# Patient Record
Sex: Female | Born: 1935 | Race: White | Hispanic: No | Smoking: Former smoker
Health system: Southern US, Community
[De-identification: ages and names within clinical notes are randomized; demographics above are authoritative.]

## PROBLEM LIST (undated history)

## (undated) DIAGNOSIS — Z87891 Personal history of nicotine dependence: Secondary | ICD-10-CM

## (undated) DIAGNOSIS — D649 Anemia, unspecified: Secondary | ICD-10-CM

## (undated) DIAGNOSIS — Z860100 Personal history of colon polyps, unspecified: Secondary | ICD-10-CM

## (undated) DIAGNOSIS — J449 Chronic obstructive pulmonary disease, unspecified: Secondary | ICD-10-CM

## (undated) DIAGNOSIS — R918 Other nonspecific abnormal finding of lung field: Secondary | ICD-10-CM

## (undated) DIAGNOSIS — J439 Emphysema, unspecified: Secondary | ICD-10-CM

## (undated) DIAGNOSIS — Z1501 Genetic susceptibility to malignant neoplasm of breast: Secondary | ICD-10-CM

## (undated) DIAGNOSIS — I10 Essential (primary) hypertension: Secondary | ICD-10-CM

## (undated) DIAGNOSIS — K219 Gastro-esophageal reflux disease without esophagitis: Secondary | ICD-10-CM

## (undated) DIAGNOSIS — I2510 Atherosclerotic heart disease of native coronary artery without angina pectoris: Secondary | ICD-10-CM

## (undated) DIAGNOSIS — D45 Polycythemia vera: Secondary | ICD-10-CM

## (undated) DIAGNOSIS — E7800 Pure hypercholesterolemia, unspecified: Secondary | ICD-10-CM

## (undated) DIAGNOSIS — R739 Hyperglycemia, unspecified: Secondary | ICD-10-CM

## (undated) DIAGNOSIS — Z1509 Genetic susceptibility to other malignant neoplasm: Secondary | ICD-10-CM

## (undated) DIAGNOSIS — N281 Cyst of kidney, acquired: Secondary | ICD-10-CM

## (undated) DIAGNOSIS — C4490 Unspecified malignant neoplasm of skin, unspecified: Secondary | ICD-10-CM

## (undated) DIAGNOSIS — I509 Heart failure, unspecified: Secondary | ICD-10-CM

## (undated) DIAGNOSIS — M810 Age-related osteoporosis without current pathological fracture: Secondary | ICD-10-CM

## (undated) DIAGNOSIS — C679 Malignant neoplasm of bladder, unspecified: Secondary | ICD-10-CM

## (undated) DIAGNOSIS — J45909 Unspecified asthma, uncomplicated: Secondary | ICD-10-CM

## (undated) HISTORY — DX: Emphysema, unspecified: J43.9

## (undated) HISTORY — PX: SALPINGOOPHORECTOMY: SHX82

## (undated) HISTORY — DX: Unspecified asthma, uncomplicated: J45.909

## (undated) HISTORY — PX: ANGIOPLASTY: SHX39

## (undated) HISTORY — DX: Heart failure, unspecified: I50.9

## (undated) HISTORY — DX: Cyst of kidney, acquired: N28.1

## (undated) HISTORY — DX: Chronic obstructive pulmonary disease, unspecified: J44.9

## (undated) HISTORY — DX: Malignant neoplasm of bladder, unspecified: C67.9

## (undated) HISTORY — PX: TUBAL LIGATION: SHX77

## (undated) HISTORY — PX: CORONARY ANGIOPLASTY: SHX604

## (undated) HISTORY — DX: Personal history of colonic polyps: Z86.010

## (undated) HISTORY — DX: Genetic susceptibility to malignant neoplasm of breast: Z15.09

## (undated) HISTORY — DX: Atherosclerotic heart disease of native coronary artery without angina pectoris: I25.10

## (undated) HISTORY — DX: Essential (primary) hypertension: I10

## (undated) HISTORY — DX: Other nonspecific abnormal finding of lung field: R91.8

## (undated) HISTORY — DX: Personal history of nicotine dependence: Z87.891

## (undated) HISTORY — DX: Age-related osteoporosis without current pathological fracture: M81.0

## (undated) HISTORY — DX: Genetic susceptibility to malignant neoplasm of breast: Z15.01

## (undated) HISTORY — PX: TONSILECTOMY/ADENOIDECTOMY WITH MYRINGOTOMY: SHX6125

## (undated) HISTORY — DX: Polycythemia vera: D45

## (undated) HISTORY — DX: Personal history of colon polyps, unspecified: Z86.0100

## (undated) HISTORY — DX: Pure hypercholesterolemia, unspecified: E78.00

## (undated) HISTORY — DX: Hyperglycemia, unspecified: R73.9

## (undated) HISTORY — PX: TONSILLECTOMY: SUR1361

---

## 2004-06-08 ENCOUNTER — Ambulatory Visit: Payer: Self-pay | Admitting: Otolaryngology

## 2004-06-21 ENCOUNTER — Ambulatory Visit: Payer: Self-pay | Admitting: Internal Medicine

## 2004-07-14 ENCOUNTER — Ambulatory Visit: Payer: Self-pay | Admitting: Otolaryngology

## 2005-01-24 ENCOUNTER — Ambulatory Visit: Payer: Self-pay | Admitting: Ophthalmology

## 2005-01-30 ENCOUNTER — Ambulatory Visit: Payer: Self-pay | Admitting: Ophthalmology

## 2005-03-06 ENCOUNTER — Ambulatory Visit: Payer: Self-pay | Admitting: Ophthalmology

## 2005-03-13 ENCOUNTER — Ambulatory Visit: Payer: Self-pay | Admitting: Ophthalmology

## 2005-06-26 ENCOUNTER — Ambulatory Visit: Payer: Self-pay | Admitting: Internal Medicine

## 2005-10-19 ENCOUNTER — Emergency Department: Payer: Self-pay | Admitting: Emergency Medicine

## 2006-01-29 ENCOUNTER — Ambulatory Visit: Payer: Self-pay | Admitting: Specialist

## 2006-01-30 ENCOUNTER — Ambulatory Visit: Payer: Self-pay | Admitting: Gastroenterology

## 2006-06-27 ENCOUNTER — Ambulatory Visit: Payer: Self-pay | Admitting: Internal Medicine

## 2007-04-09 ENCOUNTER — Ambulatory Visit: Payer: Self-pay | Admitting: Gastroenterology

## 2007-07-02 ENCOUNTER — Ambulatory Visit: Payer: Self-pay | Admitting: Internal Medicine

## 2008-08-05 ENCOUNTER — Ambulatory Visit: Payer: Self-pay | Admitting: Internal Medicine

## 2008-09-15 ENCOUNTER — Ambulatory Visit: Payer: Self-pay | Admitting: Gastroenterology

## 2008-10-08 ENCOUNTER — Ambulatory Visit: Payer: Self-pay | Admitting: Gastroenterology

## 2009-01-14 ENCOUNTER — Ambulatory Visit: Payer: Self-pay | Admitting: Unknown Physician Specialty

## 2009-08-09 ENCOUNTER — Ambulatory Visit: Payer: Self-pay | Admitting: Internal Medicine

## 2009-11-09 ENCOUNTER — Ambulatory Visit: Payer: Self-pay | Admitting: Internal Medicine

## 2010-07-12 ENCOUNTER — Ambulatory Visit: Payer: Self-pay | Admitting: Internal Medicine

## 2010-07-14 ENCOUNTER — Ambulatory Visit: Payer: Self-pay | Admitting: Internal Medicine

## 2010-08-11 ENCOUNTER — Ambulatory Visit: Payer: Self-pay | Admitting: Internal Medicine

## 2010-08-14 ENCOUNTER — Ambulatory Visit: Payer: Self-pay | Admitting: Internal Medicine

## 2010-09-13 ENCOUNTER — Ambulatory Visit: Payer: Self-pay | Admitting: Internal Medicine

## 2010-10-14 ENCOUNTER — Ambulatory Visit: Payer: Self-pay | Admitting: Gynecologic Oncology

## 2010-10-14 ENCOUNTER — Ambulatory Visit: Payer: Self-pay | Admitting: Internal Medicine

## 2010-10-15 ENCOUNTER — Ambulatory Visit: Payer: Self-pay | Admitting: Internal Medicine

## 2010-10-25 ENCOUNTER — Ambulatory Visit: Payer: Self-pay | Admitting: Gynecologic Oncology

## 2010-10-26 LAB — PATHOLOGY REPORT

## 2010-11-13 ENCOUNTER — Ambulatory Visit: Payer: Self-pay | Admitting: Internal Medicine

## 2010-12-15 ENCOUNTER — Ambulatory Visit: Payer: Self-pay | Admitting: Internal Medicine

## 2010-12-15 LAB — PULMONARY FUNCTION TEST

## 2011-01-14 ENCOUNTER — Ambulatory Visit: Payer: Self-pay | Admitting: Internal Medicine

## 2011-02-13 ENCOUNTER — Ambulatory Visit: Payer: Self-pay | Admitting: Internal Medicine

## 2011-03-16 ENCOUNTER — Ambulatory Visit: Payer: Self-pay | Admitting: Internal Medicine

## 2011-04-15 ENCOUNTER — Ambulatory Visit: Payer: Self-pay | Admitting: Internal Medicine

## 2011-05-16 ENCOUNTER — Ambulatory Visit: Payer: Self-pay | Admitting: Internal Medicine

## 2011-05-16 DIAGNOSIS — Z9079 Acquired absence of other genital organ(s): Secondary | ICD-10-CM | POA: Diagnosis not present

## 2011-05-16 DIAGNOSIS — Z79899 Other long term (current) drug therapy: Secondary | ICD-10-CM | POA: Diagnosis not present

## 2011-05-16 DIAGNOSIS — Z803 Family history of malignant neoplasm of breast: Secondary | ICD-10-CM | POA: Diagnosis not present

## 2011-05-16 DIAGNOSIS — Z148 Genetic carrier of other disease: Secondary | ICD-10-CM | POA: Diagnosis not present

## 2011-05-16 DIAGNOSIS — Z8041 Family history of malignant neoplasm of ovary: Secondary | ICD-10-CM | POA: Diagnosis not present

## 2011-05-16 DIAGNOSIS — J449 Chronic obstructive pulmonary disease, unspecified: Secondary | ICD-10-CM | POA: Diagnosis not present

## 2011-05-16 DIAGNOSIS — D45 Polycythemia vera: Secondary | ICD-10-CM | POA: Diagnosis not present

## 2011-05-25 DIAGNOSIS — J449 Chronic obstructive pulmonary disease, unspecified: Secondary | ICD-10-CM | POA: Diagnosis not present

## 2011-05-29 DIAGNOSIS — D45 Polycythemia vera: Secondary | ICD-10-CM | POA: Diagnosis not present

## 2011-05-29 LAB — CANCER CENTER HEMATOCRIT: HCT: 44.1 % (ref 35.0–47.0)

## 2011-06-16 ENCOUNTER — Ambulatory Visit: Payer: Self-pay | Admitting: Internal Medicine

## 2011-06-16 DIAGNOSIS — Z803 Family history of malignant neoplasm of breast: Secondary | ICD-10-CM | POA: Diagnosis not present

## 2011-06-16 DIAGNOSIS — Z9079 Acquired absence of other genital organ(s): Secondary | ICD-10-CM | POA: Diagnosis not present

## 2011-06-16 DIAGNOSIS — Z148 Genetic carrier of other disease: Secondary | ICD-10-CM | POA: Diagnosis not present

## 2011-06-16 DIAGNOSIS — Z87891 Personal history of nicotine dependence: Secondary | ICD-10-CM | POA: Diagnosis not present

## 2011-06-16 DIAGNOSIS — D45 Polycythemia vera: Secondary | ICD-10-CM | POA: Diagnosis not present

## 2011-06-16 DIAGNOSIS — Z79899 Other long term (current) drug therapy: Secondary | ICD-10-CM | POA: Diagnosis not present

## 2011-06-16 DIAGNOSIS — J441 Chronic obstructive pulmonary disease with (acute) exacerbation: Secondary | ICD-10-CM | POA: Diagnosis not present

## 2011-06-16 DIAGNOSIS — Z8041 Family history of malignant neoplasm of ovary: Secondary | ICD-10-CM | POA: Diagnosis not present

## 2011-06-19 DIAGNOSIS — J441 Chronic obstructive pulmonary disease with (acute) exacerbation: Secondary | ICD-10-CM | POA: Diagnosis not present

## 2011-06-19 DIAGNOSIS — D45 Polycythemia vera: Secondary | ICD-10-CM | POA: Diagnosis not present

## 2011-06-19 LAB — CANCER CENTER HEMATOCRIT: HCT: 43.7 % (ref 35.0–47.0)

## 2011-07-07 DIAGNOSIS — E782 Mixed hyperlipidemia: Secondary | ICD-10-CM | POA: Diagnosis not present

## 2011-07-07 DIAGNOSIS — I10 Essential (primary) hypertension: Secondary | ICD-10-CM | POA: Diagnosis not present

## 2011-07-07 DIAGNOSIS — I2510 Atherosclerotic heart disease of native coronary artery without angina pectoris: Secondary | ICD-10-CM | POA: Diagnosis not present

## 2011-07-10 LAB — CBC CANCER CENTER
Basophil #: 0 x10 3/mm (ref 0.0–0.1)
Basophil %: 0.4 %
Eosinophil #: 0.4 x10 3/mm (ref 0.0–0.7)
HCT: 42.3 % (ref 35.0–47.0)
HGB: 14.1 g/dL (ref 12.0–16.0)
Lymphocyte %: 12.9 %
MCH: 28 pg (ref 26.0–34.0)
MCHC: 33.4 g/dL (ref 32.0–36.0)
Monocyte #: 0.6 x10 3/mm (ref 0.0–0.7)
Monocyte %: 7.1 %
Neutrophil #: 5.8 x10 3/mm (ref 1.4–6.5)
Neutrophil %: 74.6 %
Platelet: 224 x10 3/mm (ref 150–440)

## 2011-07-14 ENCOUNTER — Ambulatory Visit: Payer: Self-pay | Admitting: Internal Medicine

## 2011-07-14 DIAGNOSIS — J449 Chronic obstructive pulmonary disease, unspecified: Secondary | ICD-10-CM | POA: Diagnosis not present

## 2011-07-14 DIAGNOSIS — Z79899 Other long term (current) drug therapy: Secondary | ICD-10-CM | POA: Diagnosis not present

## 2011-07-14 DIAGNOSIS — Z803 Family history of malignant neoplasm of breast: Secondary | ICD-10-CM | POA: Diagnosis not present

## 2011-07-14 DIAGNOSIS — Z87891 Personal history of nicotine dependence: Secondary | ICD-10-CM | POA: Diagnosis not present

## 2011-07-14 DIAGNOSIS — Z8041 Family history of malignant neoplasm of ovary: Secondary | ICD-10-CM | POA: Diagnosis not present

## 2011-07-14 DIAGNOSIS — D45 Polycythemia vera: Secondary | ICD-10-CM | POA: Diagnosis not present

## 2011-07-14 DIAGNOSIS — Z9079 Acquired absence of other genital organ(s): Secondary | ICD-10-CM | POA: Diagnosis not present

## 2011-07-14 DIAGNOSIS — Z800 Family history of malignant neoplasm of digestive organs: Secondary | ICD-10-CM | POA: Diagnosis not present

## 2011-07-14 DIAGNOSIS — Z148 Genetic carrier of other disease: Secondary | ICD-10-CM | POA: Diagnosis not present

## 2011-07-26 DIAGNOSIS — E7800 Pure hypercholesterolemia, unspecified: Secondary | ICD-10-CM | POA: Diagnosis not present

## 2011-07-26 DIAGNOSIS — I10 Essential (primary) hypertension: Secondary | ICD-10-CM | POA: Diagnosis not present

## 2011-07-26 DIAGNOSIS — R5381 Other malaise: Secondary | ICD-10-CM | POA: Diagnosis not present

## 2011-07-26 DIAGNOSIS — R7989 Other specified abnormal findings of blood chemistry: Secondary | ICD-10-CM | POA: Diagnosis not present

## 2011-07-26 DIAGNOSIS — Z79899 Other long term (current) drug therapy: Secondary | ICD-10-CM | POA: Diagnosis not present

## 2011-08-02 DIAGNOSIS — I10 Essential (primary) hypertension: Secondary | ICD-10-CM | POA: Diagnosis not present

## 2011-08-02 DIAGNOSIS — R05 Cough: Secondary | ICD-10-CM | POA: Diagnosis not present

## 2011-08-02 DIAGNOSIS — E7800 Pure hypercholesterolemia, unspecified: Secondary | ICD-10-CM | POA: Diagnosis not present

## 2011-08-02 DIAGNOSIS — J069 Acute upper respiratory infection, unspecified: Secondary | ICD-10-CM | POA: Diagnosis not present

## 2011-08-03 DIAGNOSIS — D45 Polycythemia vera: Secondary | ICD-10-CM | POA: Diagnosis not present

## 2011-08-03 LAB — CBC CANCER CENTER
Basophil #: 0 x10 3/mm (ref 0.0–0.1)
Basophil %: 0.4 %
Eosinophil #: 0 x10 3/mm (ref 0.0–0.7)
Eosinophil %: 0.1 %
HGB: 14.5 g/dL (ref 12.0–16.0)
Lymphocyte #: 1.5 x10 3/mm (ref 1.0–3.6)
MCH: 27.6 pg (ref 26.0–34.0)
Monocyte #: 0.7 x10 3/mm (ref 0.0–0.7)
Neutrophil %: 77.2 %
Platelet: 266 x10 3/mm (ref 150–440)
RBC: 5.26 10*6/uL — ABNORMAL HIGH (ref 3.80–5.20)
RDW: 15.6 % — ABNORMAL HIGH (ref 11.5–14.5)
WBC: 9.9 x10 3/mm (ref 3.6–11.0)

## 2011-08-14 ENCOUNTER — Ambulatory Visit: Payer: Self-pay | Admitting: Internal Medicine

## 2011-08-14 DIAGNOSIS — Z1231 Encounter for screening mammogram for malignant neoplasm of breast: Secondary | ICD-10-CM | POA: Diagnosis not present

## 2011-08-15 DIAGNOSIS — R0602 Shortness of breath: Secondary | ICD-10-CM | POA: Diagnosis not present

## 2011-08-15 DIAGNOSIS — R05 Cough: Secondary | ICD-10-CM | POA: Diagnosis not present

## 2011-08-15 DIAGNOSIS — J310 Chronic rhinitis: Secondary | ICD-10-CM | POA: Diagnosis not present

## 2011-08-15 DIAGNOSIS — J449 Chronic obstructive pulmonary disease, unspecified: Secondary | ICD-10-CM | POA: Diagnosis not present

## 2011-08-31 ENCOUNTER — Ambulatory Visit: Payer: Self-pay | Admitting: Internal Medicine

## 2011-08-31 DIAGNOSIS — D45 Polycythemia vera: Secondary | ICD-10-CM | POA: Diagnosis not present

## 2011-09-13 ENCOUNTER — Ambulatory Visit: Payer: Self-pay | Admitting: Internal Medicine

## 2011-09-13 DIAGNOSIS — D45 Polycythemia vera: Secondary | ICD-10-CM | POA: Diagnosis not present

## 2011-10-10 DIAGNOSIS — Z86010 Personal history of colonic polyps: Secondary | ICD-10-CM | POA: Diagnosis not present

## 2011-10-14 ENCOUNTER — Ambulatory Visit: Payer: Self-pay | Admitting: Internal Medicine

## 2011-10-14 DIAGNOSIS — D45 Polycythemia vera: Secondary | ICD-10-CM | POA: Diagnosis not present

## 2011-10-26 DIAGNOSIS — D45 Polycythemia vera: Secondary | ICD-10-CM | POA: Diagnosis not present

## 2011-10-26 LAB — CANCER CENTER HEMATOCRIT: HCT: 45 % (ref 35.0–47.0)

## 2011-11-09 LAB — CANCER CENTER HEMATOCRIT: HCT: 40.7 % (ref 35.0–47.0)

## 2011-11-13 ENCOUNTER — Ambulatory Visit: Payer: Self-pay | Admitting: Internal Medicine

## 2011-11-22 DIAGNOSIS — J449 Chronic obstructive pulmonary disease, unspecified: Secondary | ICD-10-CM | POA: Diagnosis not present

## 2011-11-22 LAB — PULMONARY FUNCTION TEST

## 2011-11-29 DIAGNOSIS — E7800 Pure hypercholesterolemia, unspecified: Secondary | ICD-10-CM | POA: Diagnosis not present

## 2011-11-29 DIAGNOSIS — Z79899 Other long term (current) drug therapy: Secondary | ICD-10-CM | POA: Diagnosis not present

## 2011-11-29 DIAGNOSIS — R7989 Other specified abnormal findings of blood chemistry: Secondary | ICD-10-CM | POA: Diagnosis not present

## 2011-11-29 DIAGNOSIS — I10 Essential (primary) hypertension: Secondary | ICD-10-CM | POA: Diagnosis not present

## 2011-12-01 DIAGNOSIS — I2510 Atherosclerotic heart disease of native coronary artery without angina pectoris: Secondary | ICD-10-CM | POA: Diagnosis not present

## 2011-12-01 DIAGNOSIS — I10 Essential (primary) hypertension: Secondary | ICD-10-CM | POA: Diagnosis not present

## 2011-12-01 DIAGNOSIS — E7800 Pure hypercholesterolemia, unspecified: Secondary | ICD-10-CM | POA: Diagnosis not present

## 2011-12-07 ENCOUNTER — Ambulatory Visit: Payer: Self-pay | Admitting: Internal Medicine

## 2011-12-07 DIAGNOSIS — D72829 Elevated white blood cell count, unspecified: Secondary | ICD-10-CM | POA: Diagnosis not present

## 2011-12-07 DIAGNOSIS — Z148 Genetic carrier of other disease: Secondary | ICD-10-CM | POA: Diagnosis not present

## 2011-12-07 DIAGNOSIS — D45 Polycythemia vera: Secondary | ICD-10-CM | POA: Diagnosis not present

## 2011-12-07 DIAGNOSIS — Z803 Family history of malignant neoplasm of breast: Secondary | ICD-10-CM | POA: Diagnosis not present

## 2011-12-07 DIAGNOSIS — Z79899 Other long term (current) drug therapy: Secondary | ICD-10-CM | POA: Diagnosis not present

## 2011-12-07 DIAGNOSIS — Z8041 Family history of malignant neoplasm of ovary: Secondary | ICD-10-CM | POA: Diagnosis not present

## 2011-12-07 DIAGNOSIS — Z87891 Personal history of nicotine dependence: Secondary | ICD-10-CM | POA: Diagnosis not present

## 2011-12-07 DIAGNOSIS — J449 Chronic obstructive pulmonary disease, unspecified: Secondary | ICD-10-CM | POA: Diagnosis not present

## 2011-12-07 LAB — CBC CANCER CENTER
Basophil #: 0 x10 3/mm (ref 0.0–0.1)
Basophil %: 0.4 %
Eosinophil %: 2.8 %
HCT: 43.1 % (ref 35.0–47.0)
HGB: 14.1 g/dL (ref 12.0–16.0)
Lymphocyte #: 1.9 x10 3/mm (ref 1.0–3.6)
Lymphocyte %: 22.1 %
MCV: 83 fL (ref 80–100)
Monocyte %: 10 %
Neutrophil #: 5.5 x10 3/mm (ref 1.4–6.5)
RBC: 5.22 10*6/uL — ABNORMAL HIGH (ref 3.80–5.20)
RDW: 14.9 % — ABNORMAL HIGH (ref 11.5–14.5)
WBC: 8.5 x10 3/mm (ref 3.6–11.0)

## 2011-12-12 ENCOUNTER — Ambulatory Visit: Payer: Self-pay | Admitting: Gastroenterology

## 2011-12-12 DIAGNOSIS — D129 Benign neoplasm of anus and anal canal: Secondary | ICD-10-CM | POA: Diagnosis not present

## 2011-12-12 DIAGNOSIS — K620 Anal polyp: Secondary | ICD-10-CM | POA: Diagnosis not present

## 2011-12-12 DIAGNOSIS — Z860100 Personal history of colon polyps, unspecified: Secondary | ICD-10-CM | POA: Diagnosis not present

## 2011-12-12 DIAGNOSIS — K5730 Diverticulosis of large intestine without perforation or abscess without bleeding: Secondary | ICD-10-CM | POA: Diagnosis not present

## 2011-12-12 DIAGNOSIS — K6389 Other specified diseases of intestine: Secondary | ICD-10-CM | POA: Diagnosis not present

## 2011-12-12 DIAGNOSIS — D649 Anemia, unspecified: Secondary | ICD-10-CM | POA: Diagnosis not present

## 2011-12-12 DIAGNOSIS — Z803 Family history of malignant neoplasm of breast: Secondary | ICD-10-CM | POA: Diagnosis not present

## 2011-12-12 DIAGNOSIS — K625 Hemorrhage of anus and rectum: Secondary | ICD-10-CM | POA: Diagnosis not present

## 2011-12-12 DIAGNOSIS — I10 Essential (primary) hypertension: Secondary | ICD-10-CM | POA: Diagnosis not present

## 2011-12-12 DIAGNOSIS — Z800 Family history of malignant neoplasm of digestive organs: Secondary | ICD-10-CM | POA: Diagnosis not present

## 2011-12-12 DIAGNOSIS — I2510 Atherosclerotic heart disease of native coronary artery without angina pectoris: Secondary | ICD-10-CM | POA: Diagnosis not present

## 2011-12-12 DIAGNOSIS — Z8041 Family history of malignant neoplasm of ovary: Secondary | ICD-10-CM | POA: Diagnosis not present

## 2011-12-12 DIAGNOSIS — J449 Chronic obstructive pulmonary disease, unspecified: Secondary | ICD-10-CM | POA: Diagnosis not present

## 2011-12-12 DIAGNOSIS — E7800 Pure hypercholesterolemia, unspecified: Secondary | ICD-10-CM | POA: Diagnosis not present

## 2011-12-12 DIAGNOSIS — M810 Age-related osteoporosis without current pathological fracture: Secondary | ICD-10-CM | POA: Diagnosis not present

## 2011-12-12 DIAGNOSIS — Z09 Encounter for follow-up examination after completed treatment for conditions other than malignant neoplasm: Secondary | ICD-10-CM | POA: Diagnosis not present

## 2011-12-12 DIAGNOSIS — D126 Benign neoplasm of colon, unspecified: Secondary | ICD-10-CM | POA: Diagnosis not present

## 2011-12-12 DIAGNOSIS — K219 Gastro-esophageal reflux disease without esophagitis: Secondary | ICD-10-CM | POA: Diagnosis not present

## 2011-12-12 DIAGNOSIS — K3189 Other diseases of stomach and duodenum: Secondary | ICD-10-CM | POA: Diagnosis not present

## 2011-12-12 DIAGNOSIS — IMO0002 Reserved for concepts with insufficient information to code with codable children: Secondary | ICD-10-CM | POA: Diagnosis not present

## 2011-12-12 DIAGNOSIS — F17200 Nicotine dependence, unspecified, uncomplicated: Secondary | ICD-10-CM | POA: Diagnosis not present

## 2011-12-12 DIAGNOSIS — Z79899 Other long term (current) drug therapy: Secondary | ICD-10-CM | POA: Diagnosis not present

## 2011-12-14 ENCOUNTER — Ambulatory Visit: Payer: Self-pay

## 2011-12-14 ENCOUNTER — Ambulatory Visit: Payer: Self-pay | Admitting: Internal Medicine

## 2012-01-11 DIAGNOSIS — I10 Essential (primary) hypertension: Secondary | ICD-10-CM | POA: Diagnosis not present

## 2012-01-11 DIAGNOSIS — I2510 Atherosclerotic heart disease of native coronary artery without angina pectoris: Secondary | ICD-10-CM | POA: Diagnosis not present

## 2012-01-11 DIAGNOSIS — J449 Chronic obstructive pulmonary disease, unspecified: Secondary | ICD-10-CM | POA: Diagnosis not present

## 2012-01-18 ENCOUNTER — Ambulatory Visit: Payer: Self-pay | Admitting: Internal Medicine

## 2012-01-18 DIAGNOSIS — D72829 Elevated white blood cell count, unspecified: Secondary | ICD-10-CM | POA: Diagnosis not present

## 2012-01-18 DIAGNOSIS — D45 Polycythemia vera: Secondary | ICD-10-CM | POA: Diagnosis not present

## 2012-01-18 LAB — CANCER CENTER HEMATOCRIT: HCT: 41.1 % (ref 35.0–47.0)

## 2012-01-19 DIAGNOSIS — R0789 Other chest pain: Secondary | ICD-10-CM | POA: Diagnosis not present

## 2012-01-22 DIAGNOSIS — Z23 Encounter for immunization: Secondary | ICD-10-CM | POA: Diagnosis not present

## 2012-02-13 ENCOUNTER — Ambulatory Visit: Payer: Self-pay | Admitting: Internal Medicine

## 2012-02-13 DIAGNOSIS — D45 Polycythemia vera: Secondary | ICD-10-CM | POA: Diagnosis not present

## 2012-02-13 DIAGNOSIS — Z79899 Other long term (current) drug therapy: Secondary | ICD-10-CM | POA: Diagnosis not present

## 2012-02-13 DIAGNOSIS — Z87891 Personal history of nicotine dependence: Secondary | ICD-10-CM | POA: Diagnosis not present

## 2012-02-13 DIAGNOSIS — D72829 Elevated white blood cell count, unspecified: Secondary | ICD-10-CM | POA: Diagnosis not present

## 2012-02-15 DIAGNOSIS — D45 Polycythemia vera: Secondary | ICD-10-CM | POA: Diagnosis not present

## 2012-02-15 LAB — CANCER CENTER HEMATOCRIT: HCT: 45.8 % (ref 35.0–47.0)

## 2012-02-21 ENCOUNTER — Telehealth: Payer: Self-pay | Admitting: Internal Medicine

## 2012-02-21 NOTE — Telephone Encounter (Signed)
Caller: Tina [name]/Patient; Patient Name: Tina [name]; PCP: Einar Pheasant; Best Callback Phone Number: [phone] Caller requesting to schedule an appointment for a physical with Dr. Nicki Reaper anytime after [facility].  Caller transferred to [facility] in clinic to schedule an appointment.

## 2012-03-11 LAB — CBC CANCER CENTER
Basophil #: 0 x10 3/mm (ref 0.0–0.1)
Eosinophil #: 0.4 x10 3/mm (ref 0.0–0.7)
HGB: 13.6 g/dL (ref 12.0–16.0)
Lymphocyte #: 2.9 x10 3/mm (ref 1.0–3.6)
MCHC: 31.4 g/dL — ABNORMAL LOW (ref 32.0–36.0)
Monocyte %: 8.5 %
Neutrophil #: 7.2 x10 3/mm — ABNORMAL HIGH (ref 1.4–6.5)
Neutrophil %: 62 %
WBC: 11.6 x10 3/mm — ABNORMAL HIGH (ref 3.6–11.0)

## 2012-03-15 ENCOUNTER — Ambulatory Visit: Payer: Self-pay | Admitting: Internal Medicine

## 2012-03-15 DIAGNOSIS — D45 Polycythemia vera: Secondary | ICD-10-CM | POA: Diagnosis not present

## 2012-03-15 DIAGNOSIS — Z79899 Other long term (current) drug therapy: Secondary | ICD-10-CM | POA: Diagnosis not present

## 2012-03-15 DIAGNOSIS — Z87891 Personal history of nicotine dependence: Secondary | ICD-10-CM | POA: Diagnosis not present

## 2012-03-15 DIAGNOSIS — D72829 Elevated white blood cell count, unspecified: Secondary | ICD-10-CM | POA: Diagnosis not present

## 2012-04-03 DIAGNOSIS — D45 Polycythemia vera: Secondary | ICD-10-CM | POA: Diagnosis not present

## 2012-04-03 DIAGNOSIS — D72829 Elevated white blood cell count, unspecified: Secondary | ICD-10-CM | POA: Diagnosis not present

## 2012-04-03 DIAGNOSIS — Z79899 Other long term (current) drug therapy: Secondary | ICD-10-CM | POA: Diagnosis not present

## 2012-04-03 LAB — CBC CANCER CENTER
Basophil #: 0 x10 3/mm (ref 0.0–0.1)
Basophil %: 0.4 %
Eosinophil #: 0.2 x10 3/mm (ref 0.0–0.7)
Eosinophil %: 2.9 %
HCT: 43.1 % (ref 35.0–47.0)
HGB: 13.5 g/dL (ref 12.0–16.0)
Lymphocyte #: 2.1 x10 3/mm (ref 1.0–3.6)
Lymphocyte %: 24.7 %
MCH: 26.1 pg (ref 26.0–34.0)
MCHC: 31.3 g/dL — ABNORMAL LOW (ref 32.0–36.0)
MCV: 83 fL (ref 80–100)
Monocyte #: 0.8 x10 3/mm (ref 0.2–0.9)
Monocyte %: 9 %
Neutrophil #: 5.3 x10 3/mm (ref 1.4–6.5)
Neutrophil %: 63 %
Platelet: 266 x10 3/mm (ref 150–440)
RBC: 5.18 10*6/uL (ref 3.80–5.20)
RDW: 16.8 % — ABNORMAL HIGH (ref 11.5–14.5)
WBC: 8.4 x10 3/mm (ref 3.6–11.0)

## 2012-04-14 ENCOUNTER — Ambulatory Visit: Payer: Self-pay | Admitting: Internal Medicine

## 2012-04-14 DIAGNOSIS — D72829 Elevated white blood cell count, unspecified: Secondary | ICD-10-CM | POA: Diagnosis not present

## 2012-04-14 DIAGNOSIS — Z87891 Personal history of nicotine dependence: Secondary | ICD-10-CM | POA: Diagnosis not present

## 2012-04-14 DIAGNOSIS — D45 Polycythemia vera: Secondary | ICD-10-CM | POA: Diagnosis not present

## 2012-04-14 DIAGNOSIS — Z79899 Other long term (current) drug therapy: Secondary | ICD-10-CM | POA: Diagnosis not present

## 2012-05-01 DIAGNOSIS — D45 Polycythemia vera: Secondary | ICD-10-CM | POA: Diagnosis not present

## 2012-05-01 DIAGNOSIS — Z79899 Other long term (current) drug therapy: Secondary | ICD-10-CM | POA: Diagnosis not present

## 2012-05-01 DIAGNOSIS — D72829 Elevated white blood cell count, unspecified: Secondary | ICD-10-CM | POA: Diagnosis not present

## 2012-05-15 ENCOUNTER — Ambulatory Visit: Payer: Self-pay | Admitting: Internal Medicine

## 2012-05-15 DIAGNOSIS — Z148 Genetic carrier of other disease: Secondary | ICD-10-CM | POA: Diagnosis not present

## 2012-05-15 DIAGNOSIS — Z79899 Other long term (current) drug therapy: Secondary | ICD-10-CM | POA: Diagnosis not present

## 2012-05-15 DIAGNOSIS — J449 Chronic obstructive pulmonary disease, unspecified: Secondary | ICD-10-CM | POA: Diagnosis not present

## 2012-05-15 DIAGNOSIS — Z87891 Personal history of nicotine dependence: Secondary | ICD-10-CM | POA: Diagnosis not present

## 2012-05-15 DIAGNOSIS — Z803 Family history of malignant neoplasm of breast: Secondary | ICD-10-CM | POA: Diagnosis not present

## 2012-05-15 DIAGNOSIS — Z8041 Family history of malignant neoplasm of ovary: Secondary | ICD-10-CM | POA: Diagnosis not present

## 2012-05-15 DIAGNOSIS — D45 Polycythemia vera: Secondary | ICD-10-CM | POA: Diagnosis not present

## 2012-05-23 ENCOUNTER — Encounter: Payer: Self-pay | Admitting: Internal Medicine

## 2012-05-23 ENCOUNTER — Other Ambulatory Visit: Payer: Self-pay | Admitting: Internal Medicine

## 2012-05-23 ENCOUNTER — Other Ambulatory Visit (HOSPITAL_COMMUNITY)
Admission: RE | Admit: 2012-05-23 | Discharge: 2012-05-23 | Disposition: A | Discharge status: Home / Self Care | Payer: Medicare Other | Source: Ambulatory Visit | Attending: Internal Medicine | Admitting: Internal Medicine

## 2012-05-23 ENCOUNTER — Ambulatory Visit (INDEPENDENT_AMBULATORY_CARE_PROVIDER_SITE_OTHER): Payer: Medicare Other | Admitting: Internal Medicine

## 2012-05-23 VITALS — BP 153/79 | HR 65 | Temp 98.4°F | Ht 65.5 in | Wt 129.0 lb

## 2012-05-23 DIAGNOSIS — E7800 Pure hypercholesterolemia, unspecified: Secondary | ICD-10-CM

## 2012-05-23 DIAGNOSIS — I10 Essential (primary) hypertension: Secondary | ICD-10-CM | POA: Diagnosis not present

## 2012-05-23 DIAGNOSIS — R5383 Other fatigue: Secondary | ICD-10-CM | POA: Diagnosis not present

## 2012-05-23 DIAGNOSIS — R739 Hyperglycemia, unspecified: Secondary | ICD-10-CM

## 2012-05-23 DIAGNOSIS — R5381 Other malaise: Secondary | ICD-10-CM

## 2012-05-23 DIAGNOSIS — D45 Polycythemia vera: Secondary | ICD-10-CM

## 2012-05-23 DIAGNOSIS — Q619 Cystic kidney disease, unspecified: Secondary | ICD-10-CM

## 2012-05-23 DIAGNOSIS — R7309 Other abnormal glucose: Secondary | ICD-10-CM

## 2012-05-23 DIAGNOSIS — Z124 Encounter for screening for malignant neoplasm of cervix: Secondary | ICD-10-CM | POA: Diagnosis not present

## 2012-05-23 DIAGNOSIS — N281 Cyst of kidney, acquired: Secondary | ICD-10-CM

## 2012-05-23 DIAGNOSIS — I2510 Atherosclerotic heart disease of native coronary artery without angina pectoris: Secondary | ICD-10-CM | POA: Insufficient documentation

## 2012-05-23 DIAGNOSIS — J449 Chronic obstructive pulmonary disease, unspecified: Secondary | ICD-10-CM

## 2012-05-23 DIAGNOSIS — Z139 Encounter for screening, unspecified: Secondary | ICD-10-CM | POA: Diagnosis not present

## 2012-05-23 DIAGNOSIS — M810 Age-related osteoporosis without current pathological fracture: Secondary | ICD-10-CM

## 2012-05-23 DIAGNOSIS — D751 Secondary polycythemia: Secondary | ICD-10-CM | POA: Insufficient documentation

## 2012-05-23 LAB — LIPID PANEL
Cholesterol: 141 mg/dL (ref 0–200)
LDL Cholesterol: 56 mg/dL (ref 0–99)
Triglycerides: 124 mg/dL (ref 0.0–149.0)
VLDL: 24.8 mg/dL (ref 0.0–40.0)

## 2012-05-23 LAB — HEPATIC FUNCTION PANEL
Albumin: 4.1 g/dL (ref 3.5–5.2)
Alkaline Phosphatase: 56 U/L (ref 39–117)
Total Protein: 7.5 g/dL (ref 6.0–8.3)

## 2012-05-23 LAB — BASIC METABOLIC PANEL
Calcium: 9.5 mg/dL (ref 8.4–10.5)
Creatinine, Ser: 1 mg/dL (ref 0.4–1.2)
GFR: 58.05 mL/min — ABNORMAL LOW (ref >60.00)
Glucose, Bld: 100 mg/dL — ABNORMAL HIGH (ref 70–99)
Sodium: 138 mEq/L (ref 135–145)

## 2012-05-23 LAB — TSH: TSH: 1.65 u[IU]/mL (ref 0.35–5.50)

## 2012-05-23 NOTE — Telephone Encounter (Signed)
Pt is needing refills on Metoperol 25 mg and Triamt-HCTZ 37.5 mg. She uses [facility].

## 2012-05-24 DIAGNOSIS — J449 Chronic obstructive pulmonary disease, unspecified: Secondary | ICD-10-CM | POA: Diagnosis not present

## 2012-05-24 MED ORDER — TRIAMTERENE-HCTZ 37.5-25 MG PO TABS: 1.0000 | ORAL_TABLET | Freq: Every day | ORAL | Status: DC

## 2012-05-24 MED ORDER — METOPROLOL TARTRATE 25 MG PO TABS: ORAL_TABLET | ORAL | Status: DC

## 2012-05-24 NOTE — Telephone Encounter (Signed)
Notify pt order for both meds - sent to [facility]

## 2012-05-26 ENCOUNTER — Encounter: Payer: Self-pay | Admitting: Internal Medicine

## 2012-05-26 NOTE — Assessment & Plan Note (Signed)
Asymptomatic.  Sees Dr Saralyn Pilar.  Doing well.  No cardiac symptoms with increased activity and exertion.

## 2012-05-26 NOTE — Assessment & Plan Note (Signed)
On crestor.  Check lipid panel and liver function.  

## 2012-05-26 NOTE — Assessment & Plan Note (Signed)
Followed by Dr Cynda Acres.  Due a follow up next week.

## 2012-05-26 NOTE — Assessment & Plan Note (Signed)
Evaluated at [place].  Last u/a - no rbc's.  Follow.

## 2012-05-26 NOTE — Progress Notes (Signed)
Subjective:    [name], female    DOB: 01-Oct-1934, 78 y.o.   MRN: [id number]  HPI 78 year old female with past history of tobacco abuse, osteoporosis, CAD s/p stent placement, hypertension and hypercholesterolemia who comes in today to follow up on these issues as well as for a complete physical exam.  She states she is doing well.  Weight is up some.  Breathing stable.  Eating well.  Has follow up with Dr Cynda Acres next week.  Still seeing Dr Raul Del.  Had a colonoscopy 8/13.  Bowels stable.  Still line dancing.    Past Medical History  Diagnosis Date  . CAD (coronary artery disease)   . Hypertension   . Hypercholesterolemia   . COPD (chronic obstructive pulmonary disease)   . Osteoporosis   . History of colon polyps   . Hyperglycemia   . Polycythemia vera   . Renal cyst     Current Outpatient Prescriptions on File Prior to Visit  Medication Sig Dispense Refill  . albuterol (PROVENTIL HFA;VENTOLIN HFA) 108 (90 BASE) MCG/ACT inhaler Inhale 2 puffs into the lungs every 6 (six) hours as needed.      . budesonide-formoterol (SYMBICORT) 160-4.5 MCG/ACT inhaler Inhale 2 puffs into the lungs 2 (two) times daily.      . Calcium Carbonate-Vitamin D (CALCIUM 600+D) 600-200 MG-UNIT TABS Take 1 tablet by mouth 2 (two) times daily.      . fluticasone (FLONASE) 50 MCG/ACT nasal spray Place 2 sprays into the nose daily.      . rosuvastatin (CRESTOR) 20 MG tablet 1/2 tablet q day      . metoprolol tartrate (LOPRESSOR) 25 MG tablet 1/2 tablet bid  90 tablet  3  . triamterene-hydrochlorothiazide (MAXZIDE-25) 37.5-25 MG per tablet Take 1 each (1 tablet total) by mouth daily.  90 tablet  3    Review of Systems Patient denies any headache, lightheadedness or dizziness.  No significant sinus or allergy symptoms.  No chest pain, tightness or palpitations.  No increased shortness of breath, cough or congestion.  Breathing stable.  No nausea or vomiting. No acid reflux.  No abdominal pain or  cramping.  No bowel change, such as diarrhea, constipation, BRBPR or melana.  No urine change.        Objective:   Physical Exam Filed Vitals:   05/23/12 1154  BP: 153/79  Pulse: 65  Temp: 98.4 F (36.9 C)   Blood pressure recheck:  19/44  78 year old female in no acute distress.   HEENT:  Nares- clear.  Oropharynx - without lesions. NECK:  Supple.  Nontender.  No audible bruit.  HEART:  Appears to be regular. LUNGS:  No crackles or wheezing audible.  Respirations even and unlabored.  RADIAL PULSE:  Equal bilaterally.    BREASTS:  No nipple discharge or nipple retraction present.  Could not appreciate any distinct nodules or axillary adenopathy.  ABDOMEN:  Soft, nontender.  Bowel sounds present and normal.  No audible abdominal bruit.  GU:  Normal external genitalia.  Vaginal vault without lesions.  Cervix identified.  Pap performed. Could not appreciate any adnexal masses or tenderness.   RECTAL:  Heme negative.   EXTREMITIES:  No increased edema present.  DP pulses palpable and equal bilaterally.           Assessment & Plan:  PREVIOUS WEIGHT LOSS.  Weight stable.  Up a few pounds.  Follow.  Eating well.    FAMILY HISTORY OF OVARIAN CANCER.  Had genetic testing.  Positive.  Had ovaries removed.  Follow.    ABNORMAL CT.  CT of chest/abdomen and pelvis 09/15/08 - mild fullness in the left adrenal gland without evidence of discreet mass.  Had follow up Ct 01/14/09 - revealed adrenals to be normal.   HEALTH MAINTENANCE.  Physical today.  Pt requested pap smear.  Mammogram 08/14/11 - BiRADSII.  Bone density 10/11 revealed no significant change (osteopenia).  Colonoscopy 8/13.  Obtain results.

## 2012-05-26 NOTE — Assessment & Plan Note (Signed)
Blood pressure elevated here.  Have her spot check her pressures and send in readings.  Follow.  Check metabolic panel.

## 2012-05-26 NOTE — Assessment & Plan Note (Signed)
Labs bone density revealed osteopenia.  Follow.  Continue calcium, vitamin D and weight bearing exercises.

## 2012-05-26 NOTE — Assessment & Plan Note (Signed)
Low carb diet and exercise.  Check metabolic panel and a1c.   

## 2012-05-26 NOTE — Assessment & Plan Note (Signed)
Breathing stable.  Seeing Dr Raul Del.  He is following CXRs

## 2012-05-29 ENCOUNTER — Encounter: Payer: Self-pay | Admitting: *Deleted

## 2012-05-29 DIAGNOSIS — D45 Polycythemia vera: Secondary | ICD-10-CM | POA: Diagnosis not present

## 2012-06-07 ENCOUNTER — Other Ambulatory Visit: Payer: Self-pay | Admitting: Internal Medicine

## 2012-06-07 MED ORDER — METOPROLOL TARTRATE 25 MG PO TABS: ORAL_TABLET | ORAL | Status: DC

## 2012-06-07 NOTE — Telephone Encounter (Signed)
[facility] told the patient they did not receive the order for her metoprolol tartrate (LOPRESSOR) 25 MG tablet  Now the patient is almost out and she is needing a 30 day supply called into [facility] , and she is wanting the prescription called into [facility] for the rest of her refill.

## 2012-06-13 NOTE — Telephone Encounter (Signed)
I was going to go ahead and order the meds but her message states [facility] and her mail order in the system states [facility] - need to clarify where she wants it sent.  Also need to know if she needs her triam/hctz is needed also.

## 2012-06-15 ENCOUNTER — Ambulatory Visit: Payer: Self-pay | Admitting: Internal Medicine

## 2012-06-15 DIAGNOSIS — Z8041 Family history of malignant neoplasm of ovary: Secondary | ICD-10-CM | POA: Diagnosis not present

## 2012-06-15 DIAGNOSIS — N83209 Unspecified ovarian cyst, unspecified side: Secondary | ICD-10-CM | POA: Diagnosis not present

## 2012-06-15 DIAGNOSIS — Z87891 Personal history of nicotine dependence: Secondary | ICD-10-CM | POA: Diagnosis not present

## 2012-06-15 DIAGNOSIS — D45 Polycythemia vera: Secondary | ICD-10-CM | POA: Diagnosis not present

## 2012-06-15 DIAGNOSIS — Z803 Family history of malignant neoplasm of breast: Secondary | ICD-10-CM | POA: Diagnosis not present

## 2012-06-15 DIAGNOSIS — J449 Chronic obstructive pulmonary disease, unspecified: Secondary | ICD-10-CM | POA: Diagnosis not present

## 2012-06-15 DIAGNOSIS — Z148 Genetic carrier of other disease: Secondary | ICD-10-CM | POA: Diagnosis not present

## 2012-06-15 DIAGNOSIS — Z79899 Other long term (current) drug therapy: Secondary | ICD-10-CM | POA: Diagnosis not present

## 2012-06-17 MED ORDER — METOPROLOL TARTRATE 25 MG PO TABS: ORAL_TABLET | ORAL | Status: DC

## 2012-06-17 NOTE — Telephone Encounter (Signed)
Sent in 30 day supply to [facility]. Called patient to see which mail order pharmacy she uses, left message for return call.

## 2012-06-17 NOTE — Telephone Encounter (Signed)
Patient called back to let us know that she uses [facility]. Sent 30 day supply to [facility]. Also sent in 6 month supply to [facility].

## 2012-06-19 ENCOUNTER — Telehealth: Payer: Self-pay | Admitting: *Deleted

## 2012-06-19 NOTE — Telephone Encounter (Signed)
Pt is saying her rx needs to go to [facility] [phone]. It's Metoprolol Tartrate 25 mg. Pt called insurance company and they said they never denied it and it was a good rx.

## 2012-06-19 NOTE — Telephone Encounter (Signed)
I have left message for patient to return my call so I can ask her to contact her insurance to see which medication they will cover.

## 2012-06-19 NOTE — Telephone Encounter (Signed)
If her insurance denied metoprolol then there should be a preferred medicine they are going to cover.  Tina [name], can you help with this and see if pt can contact insurance for coverage.  Thanks.

## 2012-06-19 NOTE — Telephone Encounter (Signed)
Patient will call the insurance company to see which medication they will cover.

## 2012-06-19 NOTE — Telephone Encounter (Signed)
Patient called to let us know that her insurance denied her refill for metoprolol. Is there another medication that she can try? Will call patient back and let her know.

## 2012-06-24 ENCOUNTER — Other Ambulatory Visit: Payer: Self-pay | Admitting: *Deleted

## 2012-06-24 DIAGNOSIS — I10 Essential (primary) hypertension: Secondary | ICD-10-CM

## 2012-06-24 MED ORDER — METOPROLOL TARTRATE 25 MG PO TABS: ORAL_TABLET | ORAL | Status: DC

## 2012-06-24 NOTE — Telephone Encounter (Signed)
MEDICATION HAS BEEN SENT TO PHARMACY

## 2012-06-24 NOTE — Telephone Encounter (Signed)
Please make sure these refills are done and notify pt.  Thanks.

## 2012-06-24 NOTE — Telephone Encounter (Signed)
Will call [phone] to reorder at [facility].

## 2012-06-24 NOTE — Telephone Encounter (Signed)
Called patient informing her meds has been sent.

## 2012-06-24 NOTE — Telephone Encounter (Signed)
3O DAY SUPPLY SENT TO LOCAL PHARMACY AND 90 DAY SUPPLY SENT TO MAIL ORDER.

## 2012-06-24 NOTE — Telephone Encounter (Signed)
Pt is calling back saying she still has not received the Metoprolol Tartrate 25 mg and is not sure why it is taking so long to get refilled ??? Pt also needs one sent into [facility] because she is completely out and needs some to get her by until she receives those from [facility].

## 2012-06-28 DIAGNOSIS — N390 Urinary tract infection, site not specified: Secondary | ICD-10-CM | POA: Insufficient documentation

## 2012-06-28 DIAGNOSIS — N393 Stress incontinence (female) (male): Secondary | ICD-10-CM | POA: Insufficient documentation

## 2012-07-01 ENCOUNTER — Other Ambulatory Visit: Payer: Self-pay | Admitting: *Deleted

## 2012-07-02 NOTE — Telephone Encounter (Signed)
Already sent in to pharmacy. 

## 2012-07-05 ENCOUNTER — Ambulatory Visit: Payer: Self-pay | Admitting: Urology

## 2012-07-05 DIAGNOSIS — N281 Cyst of kidney, acquired: Secondary | ICD-10-CM | POA: Diagnosis not present

## 2012-07-05 DIAGNOSIS — N393 Stress incontinence (female) (male): Secondary | ICD-10-CM | POA: Diagnosis not present

## 2012-07-10 DIAGNOSIS — I2510 Atherosclerotic heart disease of native coronary artery without angina pectoris: Secondary | ICD-10-CM | POA: Diagnosis not present

## 2012-07-10 DIAGNOSIS — D45 Polycythemia vera: Secondary | ICD-10-CM | POA: Diagnosis not present

## 2012-07-10 DIAGNOSIS — R05 Cough: Secondary | ICD-10-CM | POA: Diagnosis not present

## 2012-07-10 DIAGNOSIS — R0989 Other specified symptoms and signs involving the circulatory and respiratory systems: Secondary | ICD-10-CM | POA: Diagnosis not present

## 2012-07-10 DIAGNOSIS — I10 Essential (primary) hypertension: Secondary | ICD-10-CM | POA: Diagnosis not present

## 2012-07-10 LAB — CBC CANCER CENTER
Basophil %: 0.4 %
Eosinophil #: 0.3 x10 3/mm (ref 0.0–0.7)
Eosinophil %: 2.8 %
HGB: 13.6 g/dL (ref 12.0–16.0)
Lymphocyte #: 1.4 x10 3/mm (ref 1.0–3.6)
Lymphocyte %: 15.5 %
MCV: 80 fL (ref 80–100)
Monocyte #: 0.6 x10 3/mm (ref 0.2–0.9)
Monocyte %: 7 %
Neutrophil #: 6.7 x10 3/mm — ABNORMAL HIGH (ref 1.4–6.5)
Neutrophil %: 74.3 %
Platelet: 265 x10 3/mm (ref 150–440)
RBC: 5.3 10*6/uL — ABNORMAL HIGH (ref 3.80–5.20)
RDW: 15.6 % — ABNORMAL HIGH (ref 11.5–14.5)
WBC: 9 x10 3/mm (ref 3.6–11.0)

## 2012-07-12 ENCOUNTER — Telehealth: Payer: Self-pay | Admitting: Internal Medicine

## 2012-07-12 DIAGNOSIS — Z1239 Encounter for other screening for malignant neoplasm of breast: Secondary | ICD-10-CM

## 2012-07-12 NOTE — Telephone Encounter (Signed)
It is time for her to have her mammogram done, she needs a referral to [facility]

## 2012-07-13 ENCOUNTER — Ambulatory Visit: Payer: Self-pay | Admitting: Internal Medicine

## 2012-07-13 NOTE — Telephone Encounter (Signed)
Order placed for mammo.

## 2012-07-13 NOTE — Telephone Encounter (Signed)
Notify pt mammo ordered.  Is due after 08/13/12.   Amber will be calling her with an appt.

## 2012-08-06 DIAGNOSIS — J449 Chronic obstructive pulmonary disease, unspecified: Secondary | ICD-10-CM | POA: Diagnosis not present

## 2012-08-06 DIAGNOSIS — J209 Acute bronchitis, unspecified: Secondary | ICD-10-CM | POA: Diagnosis not present

## 2012-08-13 ENCOUNTER — Ambulatory Visit: Payer: Self-pay | Admitting: Internal Medicine

## 2012-08-13 DIAGNOSIS — D45 Polycythemia vera: Secondary | ICD-10-CM | POA: Diagnosis not present

## 2012-08-14 ENCOUNTER — Ambulatory Visit: Payer: Self-pay | Admitting: Internal Medicine

## 2012-08-14 DIAGNOSIS — Z1231 Encounter for screening mammogram for malignant neoplasm of breast: Secondary | ICD-10-CM | POA: Diagnosis not present

## 2012-08-22 ENCOUNTER — Encounter: Payer: Self-pay | Admitting: Internal Medicine

## 2012-08-22 ENCOUNTER — Ambulatory Visit (INDEPENDENT_AMBULATORY_CARE_PROVIDER_SITE_OTHER): Payer: Medicare Other | Admitting: Internal Medicine

## 2012-08-22 VITALS — BP 130/66 | HR 60 | Temp 97.7°F | Resp 18 | Wt 124.5 lb

## 2012-08-22 DIAGNOSIS — M810 Age-related osteoporosis without current pathological fracture: Secondary | ICD-10-CM

## 2012-08-22 DIAGNOSIS — Z860100 Personal history of colon polyps, unspecified: Secondary | ICD-10-CM

## 2012-08-22 DIAGNOSIS — I10 Essential (primary) hypertension: Secondary | ICD-10-CM

## 2012-08-22 DIAGNOSIS — J449 Chronic obstructive pulmonary disease, unspecified: Secondary | ICD-10-CM

## 2012-08-22 DIAGNOSIS — I499 Cardiac arrhythmia, unspecified: Secondary | ICD-10-CM

## 2012-08-22 DIAGNOSIS — D45 Polycythemia vera: Secondary | ICD-10-CM

## 2012-08-22 DIAGNOSIS — R7309 Other abnormal glucose: Secondary | ICD-10-CM

## 2012-08-22 DIAGNOSIS — R739 Hyperglycemia, unspecified: Secondary | ICD-10-CM

## 2012-08-22 DIAGNOSIS — J4489 Other specified chronic obstructive pulmonary disease: Secondary | ICD-10-CM

## 2012-08-22 DIAGNOSIS — E7800 Pure hypercholesterolemia, unspecified: Secondary | ICD-10-CM | POA: Diagnosis not present

## 2012-08-22 DIAGNOSIS — N281 Cyst of kidney, acquired: Secondary | ICD-10-CM

## 2012-08-22 DIAGNOSIS — Q619 Cystic kidney disease, unspecified: Secondary | ICD-10-CM

## 2012-08-22 DIAGNOSIS — I2510 Atherosclerotic heart disease of native coronary artery without angina pectoris: Secondary | ICD-10-CM | POA: Diagnosis not present

## 2012-08-24 NOTE — Progress Notes (Signed)
Subjective:    [name], female    DOB: [birth year], 78 y.o.   MRN: [id number]  HPI 78 year old female with past history of tobacco abuse, osteoporosis, CAD s/p stent placement, hypertension and hypercholesterolemia who comes in today for a scheduled follow up.  She states she is doing well.   Breathing stable now.  Saw Dr Saralyn Pilar and was given abx.  Followed up in acute care.  Abx changed.  No cough or congestion now.  Eating well.  Seeing Dr Cynda Acres.   Still seeing Dr Raul Del.  Had a colonoscopy 8/13.  Bowels stable.  Increased stress - taking care of her sister and brother-n-law.  Has been unable to go line dancing.  Plans to restart.  Feels she is handling stress relatively well.    Past Medical History  Diagnosis Date  . CAD (coronary artery disease)   . Hypertension   . Hypercholesterolemia   . COPD (chronic obstructive pulmonary disease)   . Osteoporosis   . History of colon polyps   . Hyperglycemia   . Polycythemia vera   . Renal cyst     Current Outpatient Prescriptions on File Prior to Visit  Medication Sig Dispense Refill  . albuterol (PROVENTIL HFA;VENTOLIN HFA) 108 (90 BASE) MCG/ACT inhaler Inhale 2 puffs into the lungs every 6 (six) hours as needed.      . budesonide-formoterol (SYMBICORT) 160-4.5 MCG/ACT inhaler Inhale 2 puffs into the lungs 2 (two) times daily.      . Calcium Carbonate-Vitamin D (CALCIUM 600+D) 600-200 MG-UNIT TABS Take 1 tablet by mouth 2 (two) times daily.      Sarajane Marek Sodium 30-100 MG CAPS Take by mouth.      . fluticasone (FLONASE) 50 MCG/ACT nasal spray Place 2 sprays into the nose daily.      . metoprolol tartrate (LOPRESSOR) 25 MG tablet 1/2 tablet bid  90 tablet  1  . rosuvastatin (CRESTOR) 20 MG tablet 1/2 tablet q day      . triamterene-hydrochlorothiazide (MAXZIDE-25) 37.5-25 MG per tablet Take 1 each (1 tablet total) by mouth daily.  90 tablet  3  . metoprolol tartrate (LOPRESSOR) 25 MG tablet 1/2 tablet bid   30 tablet  0   No current facility-administered medications on file prior to visit.    Review of Systems Patient denies any headache, lightheadedness or dizziness.  No significant sinus or allergy symptoms.  No chest pain, tightness or palpitations.  No increased shortness of breath, cough or congestion.  Breathing stable.  No nausea or vomiting. No acid reflux.  No abdominal pain or cramping.  No bowel change, such as diarrhea, constipation, BRBPR or melana.  No urine change.  Increased stress as outlined.        Objective:   Physical Exam  Filed Vitals:   08/22/12 0914  BP: 130/66  Pulse: 60  Temp: 97.7 F (36.5 C)  Resp: 34   77 year old female in no acute distress.   HEENT:  Nares- clear.  Oropharynx - without lesions. NECK:  Supple.  Nontender.  No audible bruit.  HEART:  Appears to be regular. LUNGS:  No crackles or wheezing audible.  Respirations even and unlabored.  RADIAL PULSE:  Equal bilaterally.     ABDOMEN:  Soft, nontender.  Bowel sounds present and normal.  No audible abdominal bruit.   EXTREMITIES:  No increased edema present.  DP pulses palpable and equal bilaterally.  Assessment & Plan:  PREVIOUS WEIGHT LOSS.  Weight has been relatively stable lately.  Follow.  Eating well.    FAMILY HISTORY OF OVARIAN CANCER.  Had genetic testing.  Positive.  Had ovaries removed.  Follow.    ABNORMAL CT.  CT of chest/abdomen and pelvis 09/15/08 - mild fullness in the left adrenal gland without evidence of discreet mass.  Had follow up Ct 01/14/09 - revealed adrenals to be normal.   PREMATURE BEATS.  Noticed on exam.  EKG obtained and revealed SR with frequent PACs.  Currently asymptomatic.  Follow.   HEALTH MAINTENANCE.  Physical 1/0/14.  Pap smear - ok.   Mammogram 08/14/12 - BiRADSI.  Bone density 10/11 revealed no significant change (osteopenia).  Colonoscopy 12/12/11 - four 1-35mm polyps at the recto-sigmoid colon,, tortuous colon and diverticulosis.  Recommended follow  up colonoscopy 3 years.

## 2012-08-25 ENCOUNTER — Encounter: Payer: Self-pay | Admitting: Internal Medicine

## 2012-08-25 DIAGNOSIS — Z860100 Personal history of colon polyps, unspecified: Secondary | ICD-10-CM | POA: Insufficient documentation

## 2012-08-25 NOTE — Assessment & Plan Note (Signed)
Low carb diet and exercise.  Follow metabolic panel and [id number].  A1c 05/23/12 - 6.4.

## 2012-08-25 NOTE — Assessment & Plan Note (Signed)
Breathing stable.  Seeing Dr Raul Del.  He is following CXRs

## 2012-08-25 NOTE — Assessment & Plan Note (Addendum)
Asymptomatic.  Sees Dr Saralyn Pilar.  Doing well.  No cardiac symptoms with increased activity and exertion.  Stress test 9/13 - negative.

## 2012-08-25 NOTE — Assessment & Plan Note (Signed)
On crestor.  Follow lipid panel and liver function.

## 2012-08-25 NOTE — Assessment & Plan Note (Signed)
Followed by Dr Cynda Acres.

## 2012-08-25 NOTE — Assessment & Plan Note (Signed)
Blood pressure per her report has been under reasonable control.   Follow.  Follow metabolic panel.

## 2012-08-25 NOTE — Assessment & Plan Note (Signed)
Labs bone density revealed osteopenia.  Follow.  Continue calcium, vitamin D and weight bearing exercises.

## 2012-08-25 NOTE — Assessment & Plan Note (Signed)
Followed by Dr Jacqlyn Larsen.  Recommended follow up one year.  Apparently recently evaluated.

## 2012-08-27 ENCOUNTER — Encounter: Payer: Self-pay | Admitting: Internal Medicine

## 2012-08-28 NOTE — Telephone Encounter (Signed)
Please close encounter

## 2012-09-12 ENCOUNTER — Ambulatory Visit: Payer: Self-pay | Admitting: Internal Medicine

## 2012-09-12 DIAGNOSIS — D45 Polycythemia vera: Secondary | ICD-10-CM | POA: Diagnosis not present

## 2012-10-09 LAB — CANCER CENTER HEMATOCRIT: HCT: 40.8 % (ref 35.0–47.0)

## 2012-10-10 ENCOUNTER — Telehealth: Payer: Self-pay | Admitting: *Deleted

## 2012-10-10 MED ORDER — FLUTICASONE PROPIONATE 50 MCG/ACT NA SUSP: 2.0000 | Freq: Every day | NASAL | Status: DC

## 2012-10-10 NOTE — Telephone Encounter (Signed)
Patient would like refill called to [facility] for Fluticasone spray,

## 2012-10-13 ENCOUNTER — Ambulatory Visit: Payer: Self-pay | Admitting: Internal Medicine

## 2012-10-22 DIAGNOSIS — J449 Chronic obstructive pulmonary disease, unspecified: Secondary | ICD-10-CM | POA: Diagnosis not present

## 2012-11-12 ENCOUNTER — Ambulatory Visit: Payer: Self-pay | Admitting: Internal Medicine

## 2012-11-12 DIAGNOSIS — Z87891 Personal history of nicotine dependence: Secondary | ICD-10-CM | POA: Diagnosis not present

## 2012-11-12 DIAGNOSIS — Z1501 Genetic susceptibility to malignant neoplasm of breast: Secondary | ICD-10-CM | POA: Diagnosis not present

## 2012-11-12 DIAGNOSIS — D45 Polycythemia vera: Secondary | ICD-10-CM | POA: Diagnosis not present

## 2012-11-12 DIAGNOSIS — Z79899 Other long term (current) drug therapy: Secondary | ICD-10-CM | POA: Diagnosis not present

## 2012-11-20 DIAGNOSIS — D45 Polycythemia vera: Secondary | ICD-10-CM | POA: Diagnosis not present

## 2012-11-20 LAB — CANCER CENTER HEMATOCRIT: HCT: 41.5 % (ref 35.0–47.0)

## 2012-11-26 ENCOUNTER — Encounter: Payer: Self-pay | Admitting: Internal Medicine

## 2012-11-26 ENCOUNTER — Ambulatory Visit (INDEPENDENT_AMBULATORY_CARE_PROVIDER_SITE_OTHER): Payer: Medicare Other | Admitting: Internal Medicine

## 2012-11-26 VITALS — BP 150/70 | HR 52 | Temp 98.0°F | Ht 64.75 in | Wt 130.2 lb

## 2012-11-26 DIAGNOSIS — R7309 Other abnormal glucose: Secondary | ICD-10-CM

## 2012-11-26 DIAGNOSIS — I10 Essential (primary) hypertension: Secondary | ICD-10-CM

## 2012-11-26 DIAGNOSIS — J449 Chronic obstructive pulmonary disease, unspecified: Secondary | ICD-10-CM | POA: Diagnosis not present

## 2012-11-26 DIAGNOSIS — Z86010 Personal history of colonic polyps: Secondary | ICD-10-CM

## 2012-11-26 DIAGNOSIS — Q619 Cystic kidney disease, unspecified: Secondary | ICD-10-CM

## 2012-11-26 DIAGNOSIS — I2510 Atherosclerotic heart disease of native coronary artery without angina pectoris: Secondary | ICD-10-CM | POA: Diagnosis not present

## 2012-11-26 DIAGNOSIS — R739 Hyperglycemia, unspecified: Secondary | ICD-10-CM

## 2012-11-26 DIAGNOSIS — E7800 Pure hypercholesterolemia, unspecified: Secondary | ICD-10-CM

## 2012-11-26 DIAGNOSIS — M810 Age-related osteoporosis without current pathological fracture: Secondary | ICD-10-CM

## 2012-11-26 DIAGNOSIS — D45 Polycythemia vera: Secondary | ICD-10-CM

## 2012-11-26 DIAGNOSIS — N281 Cyst of kidney, acquired: Secondary | ICD-10-CM

## 2012-11-26 MED ORDER — TRIAMTERENE-HCTZ 37.5-25 MG PO TABS: 1.0000 | ORAL_TABLET | Freq: Every day | ORAL | Status: DC

## 2012-11-30 ENCOUNTER — Encounter: Payer: Self-pay | Admitting: Internal Medicine

## 2012-11-30 NOTE — Assessment & Plan Note (Signed)
Followed by Dr Cope.  

## 2012-11-30 NOTE — Assessment & Plan Note (Signed)
Asymptomatic.  Sees Dr Saralyn Pilar.  Doing well.  No cardiac symptoms with increased activity and exertion.  Stress test 9/13 - negative.

## 2012-11-30 NOTE — Assessment & Plan Note (Signed)
Followed by Dr Cynda Acres.

## 2012-11-30 NOTE — Assessment & Plan Note (Signed)
Colonoscopy as outlined.  Recommended f/u colonoscopy three years from 7/13 colonoscopy.

## 2012-11-30 NOTE — Assessment & Plan Note (Signed)
Low carb diet and exercise.  Follow metabolic panel and a1c.   

## 2012-11-30 NOTE — Progress Notes (Signed)
Subjective:    [name], female    DOB: 14-Jul-1934, 78 y.o.   MRN: [id number]  HPI 78 year old female with past history of tobacco abuse, osteoporosis, CAD s/p stent placement, hypertension and hypercholesterolemia who comes in today for a scheduled follow up.  She states she is doing well.   Breathing stable.  No cough or congestion now.  Eating well.  Seeing Dr Cynda Acres.   Still seeing Dr Raul Del.  Just had PFTs.  Some decrease in her lung function.  Back on Symbicort.  Had a colonoscopy 8/13.  Bowels stable.  Increased stress - taking care of family members.   Feels she is handling stress relatively well.  Is line dancing.  Overall she feels she is doing relatively well.     Past Medical History  Diagnosis Date  . CAD (coronary artery disease)   . Hypertension   . Hypercholesterolemia   . COPD (chronic obstructive pulmonary disease)   . Osteoporosis   . History of colon polyps   . Hyperglycemia   . Polycythemia vera(238.4)   . Renal cyst     Current Outpatient Prescriptions on File Prior to Visit  Medication Sig Dispense Refill  . albuterol (PROVENTIL HFA;VENTOLIN HFA) 108 (90 BASE) MCG/ACT inhaler Inhale 2 puffs into the lungs every 6 (six) hours as needed.      . budesonide-formoterol (SYMBICORT) 160-4.5 MCG/ACT inhaler Inhale 2 puffs into the lungs 2 (two) times daily.      . Calcium Carbonate-Vitamin D (CALCIUM 600+D) 600-200 MG-UNIT TABS Take 1 tablet by mouth 2 (two) times daily.      Sarajane Marek Sodium 30-100 MG CAPS Take by mouth.      . fluticasone (FLONASE) 50 MCG/ACT nasal spray Place 2 sprays into the nose daily.  16 g  5  . metoprolol tartrate (LOPRESSOR) 25 MG tablet 1/2 tablet bid  90 tablet  1  . rosuvastatin (CRESTOR) 20 MG tablet 1/2 tablet q day       No current facility-administered medications on file prior to visit.    Review of Systems Patient denies any headache, lightheadedness or dizziness.  No significant sinus or allergy  symptoms.  No chest pain, tightness or palpitations.  No increased shortness of breath, cough or congestion.  Breathing stable.  No nausea or vomiting.  No acid reflux.  No abdominal pain or cramping.  No bowel change, such as diarrhea, constipation, BRBPR or melana.  No urine change.  Increased stress as outlined.  Feels she is handling things relatively well.  Is line dancing.       Objective:   Physical Exam  Filed Vitals:   11/26/12 1038  BP: 150/70  Pulse: 52  Temp: 98 F (36.7 C)   Blood pressure recheck:  55/49  78 year old female in no acute distress.   HEENT:  Nares- clear.  Oropharynx - without lesions. NECK:  Supple.  Nontender.  No audible bruit.  HEART:  Appears to be regular. LUNGS:  No crackles or wheezing audible.  Respirations even and unlabored.  RADIAL PULSE:  Equal bilaterally.     ABDOMEN:  Soft, nontender.  Bowel sounds present and normal.  No audible abdominal bruit.   EXTREMITIES:  No increased edema present.  DP pulses palpable and equal bilaterally.           Assessment & Plan:  PREVIOUS WEIGHT LOSS.  Weight has been stable lately.  Follow.  Eating well.    FAMILY HISTORY OF  OVARIAN CANCER.  Had genetic testing.  Positive.  Had ovaries removed.  Follow.    ABNORMAL CT.  CT of chest/abdomen and pelvis 09/15/08 - mild fullness in the left adrenal gland without evidence of discreet mass.  Had follow up Ct 01/14/09 - revealed adrenals to be normal.   HEALTH MAINTENANCE.  Physical 1/0/14.  Pap smear - ok.   Mammogram 08/14/12 - BiRADSI.  Bone density 10/11 revealed no significant change (osteopenia).  Colonoscopy 12/12/11 - four 1-77mm polyps at the recto-sigmoid colon,, tortuous colon and diverticulosis.  Recommended follow up colonoscopy 3 years.

## 2012-11-30 NOTE — Assessment & Plan Note (Signed)
Breathing stable.  Seeing Dr Raul Del.  He is following CXRs

## 2012-11-30 NOTE — Assessment & Plan Note (Signed)
On crestor.  Follow lipid panel and liver function.

## 2012-11-30 NOTE — Assessment & Plan Note (Signed)
Labs bone density revealed osteopenia.  Follow.  Continue calcium, vitamin D and weight bearing exercises.

## 2012-11-30 NOTE — Assessment & Plan Note (Signed)
Blood pressure per her report has been under reasonable control.  Recheck improved.  Follow.  Follow metabolic panel.

## 2012-12-11 ENCOUNTER — Other Ambulatory Visit: Payer: Medicare Other

## 2012-12-13 ENCOUNTER — Ambulatory Visit: Payer: Self-pay | Admitting: Internal Medicine

## 2012-12-13 DIAGNOSIS — Z79899 Other long term (current) drug therapy: Secondary | ICD-10-CM | POA: Diagnosis not present

## 2012-12-13 DIAGNOSIS — Z87891 Personal history of nicotine dependence: Secondary | ICD-10-CM | POA: Diagnosis not present

## 2012-12-13 DIAGNOSIS — Z1501 Genetic susceptibility to malignant neoplasm of breast: Secondary | ICD-10-CM | POA: Diagnosis not present

## 2012-12-13 DIAGNOSIS — D45 Polycythemia vera: Secondary | ICD-10-CM | POA: Diagnosis not present

## 2012-12-18 ENCOUNTER — Other Ambulatory Visit: Payer: Self-pay | Admitting: *Deleted

## 2012-12-18 MED ORDER — TRIAMTERENE-HCTZ 37.5-25 MG PO TABS: 1.0000 | ORAL_TABLET | Freq: Every day | ORAL | Status: DC

## 2012-12-19 ENCOUNTER — Encounter: Payer: Self-pay | Admitting: *Deleted

## 2012-12-19 ENCOUNTER — Other Ambulatory Visit (INDEPENDENT_AMBULATORY_CARE_PROVIDER_SITE_OTHER): Payer: Medicare Other

## 2012-12-19 DIAGNOSIS — R7309 Other abnormal glucose: Secondary | ICD-10-CM

## 2012-12-19 DIAGNOSIS — E7800 Pure hypercholesterolemia, unspecified: Secondary | ICD-10-CM | POA: Diagnosis not present

## 2012-12-19 DIAGNOSIS — I10 Essential (primary) hypertension: Secondary | ICD-10-CM

## 2012-12-19 DIAGNOSIS — R739 Hyperglycemia, unspecified: Secondary | ICD-10-CM

## 2012-12-19 LAB — LIPID PANEL
Cholesterol: 132 mg/dL (ref 0–200)
HDL: 61.5 mg/dL (ref >39.00)
LDL Cholesterol: 56 mg/dL (ref 0–99)
Total CHOL/HDL Ratio: 2
Triglycerides: 72 mg/dL (ref 0.0–149.0)
VLDL: 14.4 mg/dL (ref 0.0–40.0)

## 2012-12-19 LAB — BASIC METABOLIC PANEL
BUN: 22 mg/dL (ref 6–23)
CO2: 29 mEq/L (ref 19–32)
Calcium: 9.4 mg/dL (ref 8.4–10.5)
Chloride: 102 mEq/L (ref 96–112)
Creatinine, Ser: 1 mg/dL (ref 0.4–1.2)
GFR: 55.99 mL/min — ABNORMAL LOW (ref >60.00)
Glucose, Bld: 112 mg/dL — ABNORMAL HIGH (ref 70–99)
Potassium: 3.6 mEq/L (ref 3.5–5.1)
Sodium: 138 mEq/L (ref 135–145)

## 2012-12-19 LAB — HEPATIC FUNCTION PANEL
ALT: 12 U/L (ref 0–35)
AST: 19 U/L (ref 0–37)
Albumin: 3.9 g/dL (ref 3.5–5.2)
Alkaline Phosphatase: 53 U/L (ref 39–117)
Bilirubin, Direct: 0.1 mg/dL (ref 0.0–0.3)
Total Bilirubin: 0.7 mg/dL (ref 0.3–1.2)
Total Protein: 7 g/dL (ref 6.0–8.3)

## 2012-12-19 LAB — HEMOGLOBIN A1C: Hgb A1c MFr Bld: 6.2 % (ref 4.6–6.5)

## 2013-01-01 LAB — CANCER CENTER HEMATOCRIT: HCT: 44.1 % (ref 35.0–47.0)

## 2013-01-13 ENCOUNTER — Ambulatory Visit: Payer: Self-pay | Admitting: Internal Medicine

## 2013-01-16 DIAGNOSIS — Z23 Encounter for immunization: Secondary | ICD-10-CM | POA: Diagnosis not present

## 2013-02-03 ENCOUNTER — Other Ambulatory Visit: Payer: Self-pay | Admitting: Internal Medicine

## 2013-02-12 ENCOUNTER — Ambulatory Visit: Payer: Self-pay | Admitting: Internal Medicine

## 2013-02-12 DIAGNOSIS — Z79899 Other long term (current) drug therapy: Secondary | ICD-10-CM | POA: Diagnosis not present

## 2013-02-12 DIAGNOSIS — D45 Polycythemia vera: Secondary | ICD-10-CM | POA: Diagnosis not present

## 2013-02-12 DIAGNOSIS — Z87891 Personal history of nicotine dependence: Secondary | ICD-10-CM | POA: Diagnosis not present

## 2013-02-12 LAB — CBC CANCER CENTER
Basophil #: 0 x10 3/mm (ref 0.0–0.1)
Eosinophil #: 0.4 x10 3/mm (ref 0.0–0.7)
HCT: 42 % (ref 35.0–47.0)
HGB: 13.7 g/dL (ref 12.0–16.0)
MCH: 27.1 pg (ref 26.0–34.0)
MCHC: 32.6 g/dL (ref 32.0–36.0)
Monocyte %: 10.1 %
Neutrophil %: 61 %
Platelet: 261 x10 3/mm (ref 150–440)
WBC: 8.1 x10 3/mm (ref 3.6–11.0)

## 2013-03-15 ENCOUNTER — Ambulatory Visit: Payer: Self-pay | Admitting: Internal Medicine

## 2013-03-15 DIAGNOSIS — D45 Polycythemia vera: Secondary | ICD-10-CM | POA: Diagnosis not present

## 2013-03-25 DIAGNOSIS — E782 Mixed hyperlipidemia: Secondary | ICD-10-CM | POA: Diagnosis not present

## 2013-03-25 DIAGNOSIS — I2510 Atherosclerotic heart disease of native coronary artery without angina pectoris: Secondary | ICD-10-CM | POA: Diagnosis not present

## 2013-03-25 DIAGNOSIS — J449 Chronic obstructive pulmonary disease, unspecified: Secondary | ICD-10-CM | POA: Diagnosis not present

## 2013-03-25 DIAGNOSIS — I10 Essential (primary) hypertension: Secondary | ICD-10-CM | POA: Diagnosis not present

## 2013-03-25 DIAGNOSIS — R0609 Other forms of dyspnea: Secondary | ICD-10-CM | POA: Diagnosis not present

## 2013-04-11 LAB — CANCER CENTER HEMATOCRIT: HCT: 41.4 % (ref 35.0–47.0)

## 2013-04-14 ENCOUNTER — Ambulatory Visit: Payer: Self-pay | Admitting: Internal Medicine

## 2013-05-30 ENCOUNTER — Ambulatory Visit: Payer: Self-pay | Admitting: Internal Medicine

## 2013-05-30 DIAGNOSIS — J449 Chronic obstructive pulmonary disease, unspecified: Secondary | ICD-10-CM | POA: Diagnosis not present

## 2013-05-30 DIAGNOSIS — Z87891 Personal history of nicotine dependence: Secondary | ICD-10-CM | POA: Diagnosis not present

## 2013-05-30 DIAGNOSIS — Z79899 Other long term (current) drug therapy: Secondary | ICD-10-CM | POA: Diagnosis not present

## 2013-05-30 DIAGNOSIS — D45 Polycythemia vera: Secondary | ICD-10-CM | POA: Diagnosis not present

## 2013-05-30 LAB — CBC CANCER CENTER
Basophil #: 0 x10 3/mm (ref 0.0–0.1)
Basophil %: 0.4 %
EOS PCT: 2.6 %
Eosinophil #: 0.2 x10 3/mm (ref 0.0–0.7)
HCT: 43.3 % (ref 35.0–47.0)
HGB: 14.2 g/dL (ref 12.0–16.0)
LYMPHS PCT: 18.5 %
Lymphocyte #: 1.5 x10 3/mm (ref 1.0–3.6)
MCH: 27.1 pg (ref 26.0–34.0)
MCHC: 32.7 g/dL (ref 32.0–36.0)
MCV: 83 fL (ref 80–100)
Monocyte #: 0.7 x10 3/mm (ref 0.2–0.9)
Monocyte %: 9.2 %
NEUTROS ABS: 5.6 x10 3/mm (ref 1.4–6.5)
Neutrophil %: 69.3 %
PLATELETS: 262 x10 3/mm (ref 150–440)
RBC: 5.23 10*6/uL — AB (ref 3.80–5.20)
RDW: 16.3 % — ABNORMAL HIGH (ref 11.5–14.5)
WBC: 8.1 x10 3/mm (ref 3.6–11.0)

## 2013-06-09 ENCOUNTER — Encounter: Payer: Medicare Other | Admitting: Internal Medicine

## 2013-06-15 ENCOUNTER — Ambulatory Visit: Payer: Self-pay | Admitting: Internal Medicine

## 2013-07-24 ENCOUNTER — Ambulatory Visit: Payer: Self-pay | Admitting: Internal Medicine

## 2013-07-24 DIAGNOSIS — D45 Polycythemia vera: Secondary | ICD-10-CM | POA: Diagnosis not present

## 2013-07-24 DIAGNOSIS — D720 Genetic anomalies of leukocytes: Secondary | ICD-10-CM | POA: Diagnosis not present

## 2013-07-25 DIAGNOSIS — D720 Genetic anomalies of leukocytes: Secondary | ICD-10-CM | POA: Diagnosis not present

## 2013-07-25 DIAGNOSIS — D45 Polycythemia vera: Secondary | ICD-10-CM | POA: Diagnosis not present

## 2013-07-25 LAB — CBC CANCER CENTER
Basophil #: 0 x10 3/mm (ref 0.0–0.1)
Basophil %: 0.3 %
EOS ABS: 0.2 x10 3/mm (ref 0.0–0.7)
EOS PCT: 2.7 %
HCT: 43.5 % (ref 35.0–47.0)
HGB: 14.1 g/dL (ref 12.0–16.0)
LYMPHS PCT: 21.2 %
Lymphocyte #: 1.6 x10 3/mm (ref 1.0–3.6)
MCH: 27.3 pg (ref 26.0–34.0)
MCHC: 32.4 g/dL (ref 32.0–36.0)
MCV: 84 fL (ref 80–100)
MONO ABS: 0.6 x10 3/mm (ref 0.2–0.9)
MONOS PCT: 8.7 %
Neutrophil #: 5 x10 3/mm (ref 1.4–6.5)
Neutrophil %: 67.1 %
Platelet: 233 x10 3/mm (ref 150–440)
RBC: 5.16 10*6/uL (ref 3.80–5.20)
RDW: 16.1 % — AB (ref 11.5–14.5)
WBC: 7.4 x10 3/mm (ref 3.6–11.0)

## 2013-08-07 ENCOUNTER — Encounter (INDEPENDENT_AMBULATORY_CARE_PROVIDER_SITE_OTHER): Payer: Self-pay

## 2013-08-07 ENCOUNTER — Encounter: Payer: Self-pay | Admitting: Internal Medicine

## 2013-08-07 ENCOUNTER — Ambulatory Visit (INDEPENDENT_AMBULATORY_CARE_PROVIDER_SITE_OTHER): Payer: Medicare Other | Admitting: Internal Medicine

## 2013-08-07 VITALS — BP 120/70 | HR 69 | Temp 98.6°F | Ht 64.5 in | Wt 132.5 lb

## 2013-08-07 DIAGNOSIS — R7309 Other abnormal glucose: Secondary | ICD-10-CM

## 2013-08-07 DIAGNOSIS — R5383 Other fatigue: Secondary | ICD-10-CM | POA: Diagnosis not present

## 2013-08-07 DIAGNOSIS — R5381 Other malaise: Secondary | ICD-10-CM | POA: Diagnosis not present

## 2013-08-07 DIAGNOSIS — J449 Chronic obstructive pulmonary disease, unspecified: Secondary | ICD-10-CM

## 2013-08-07 DIAGNOSIS — E7800 Pure hypercholesterolemia, unspecified: Secondary | ICD-10-CM | POA: Diagnosis not present

## 2013-08-07 DIAGNOSIS — Z86010 Personal history of colonic polyps: Secondary | ICD-10-CM

## 2013-08-07 DIAGNOSIS — M810 Age-related osteoporosis without current pathological fracture: Secondary | ICD-10-CM

## 2013-08-07 DIAGNOSIS — I2510 Atherosclerotic heart disease of native coronary artery without angina pectoris: Secondary | ICD-10-CM | POA: Diagnosis not present

## 2013-08-07 DIAGNOSIS — Q619 Cystic kidney disease, unspecified: Secondary | ICD-10-CM

## 2013-08-07 DIAGNOSIS — R739 Hyperglycemia, unspecified: Secondary | ICD-10-CM

## 2013-08-07 DIAGNOSIS — F05 Delirium due to known physiological condition: Secondary | ICD-10-CM

## 2013-08-07 DIAGNOSIS — I10 Essential (primary) hypertension: Secondary | ICD-10-CM | POA: Diagnosis not present

## 2013-08-07 DIAGNOSIS — D45 Polycythemia vera: Secondary | ICD-10-CM

## 2013-08-07 DIAGNOSIS — N281 Cyst of kidney, acquired: Secondary | ICD-10-CM

## 2013-08-07 LAB — LIPID PANEL
CHOLESTEROL: 145 mg/dL (ref 0–200)
HDL: 66.2 mg/dL (ref >39.00)
LDL CALC: 66 mg/dL (ref 0–99)
Total CHOL/HDL Ratio: 2
Triglycerides: 62 mg/dL (ref 0.0–149.0)
VLDL: 12.4 mg/dL (ref 0.0–40.0)

## 2013-08-07 LAB — HEPATIC FUNCTION PANEL
ALT: 17 U/L (ref 0–35)
AST: 24 U/L (ref 0–37)
Albumin: 4.2 g/dL (ref 3.5–5.2)
Alkaline Phosphatase: 52 U/L (ref 39–117)
BILIRUBIN DIRECT: 0.2 mg/dL (ref 0.0–0.3)
BILIRUBIN TOTAL: 0.8 mg/dL (ref 0.3–1.2)
TOTAL PROTEIN: 6.8 g/dL (ref 6.0–8.3)

## 2013-08-07 LAB — BASIC METABOLIC PANEL
BUN: 18 mg/dL (ref 6–23)
CHLORIDE: 102 meq/L (ref 96–112)
CO2: 29 mEq/L (ref 19–32)
Calcium: 9.4 mg/dL (ref 8.4–10.5)
Creatinine, Ser: 0.9 mg/dL (ref 0.4–1.2)
GFR: 62.19 mL/min (ref >60.00)
Glucose, Bld: 116 mg/dL — ABNORMAL HIGH (ref 70–99)
POTASSIUM: 3.5 meq/L (ref 3.5–5.1)
Sodium: 139 mEq/L (ref 135–145)

## 2013-08-07 LAB — TSH: TSH: 1.26 u[IU]/mL (ref 0.35–5.50)

## 2013-08-07 LAB — HEMOGLOBIN A1C: HEMOGLOBIN A1C: 6.1 % (ref 4.6–6.5)

## 2013-08-07 NOTE — Progress Notes (Signed)
Subjective:    [name], female    DOB: [birth year], 79 y.o.   MRN: [id number]  HPI 79 year old female with past history of tobacco abuse, osteoporosis, CAD s/p stent placement, hypertension and hypercholesterolemia who comes in today to follow up on these issues as well as for a complete physical exam.  She states she is doing well.   Breathing stable.  No cough or congestion now.  Eating well.  Seeing Dr Cynda Acres.   Still seeing Dr Raul Del.  He follows PFTs.  Some decrease in her lung function.  Back on Symbicort.  Had a colonoscopy 8/13.  Bowels stable.  Increased stress - taking care of family members.   Feels she is handling stress relatively well.  Is line dancing.  Overall she feels she is doing relatively well.  She did have an episode 3-4 weeks ago when she was shopping for a few groceries.  States this is a store she goes in regularly.  She states while in the store she became unfamiliar with her surroundings.  She knew that she was in a grocery store and knew that she was to pick up a few items for her sister, but her surroundings were unfamiliar.  She did not know where things were in the store.  She had to find her car.  She initially drove the wrong way on [address] and nothing looked familiar.  She turned around and went the correct way.  While driving, she then states it just changed and she knew exactly where she was.  Things were familiar to her again.  The episode lasted less than 30 minutes.  Has never had an episode like that previously and has not noticed anything like that since.  No vision change.  No focal motor deficits or change noted.     Past Medical History  Diagnosis Date  . CAD (coronary artery disease)   . Hypertension   . Hypercholesterolemia   . COPD (chronic obstructive pulmonary disease)   . Osteoporosis   . History of colon polyps   . Hyperglycemia   . Polycythemia vera(238.4)   . Renal cyst     Current Outpatient Prescriptions on File Prior  to Visit  Medication Sig Dispense Refill  . albuterol (PROVENTIL HFA;VENTOLIN HFA) 108 (90 BASE) MCG/ACT inhaler Inhale 2 puffs into the lungs every 6 (six) hours as needed.      . budesonide-formoterol (SYMBICORT) 160-4.5 MCG/ACT inhaler Inhale 2 puffs into the lungs 2 (two) times daily.      . Calcium Carbonate-Vitamin D (CALCIUM 600+D) 600-200 MG-UNIT TABS Take 1 tablet by mouth 2 (two) times daily.      Sarajane Marek Sodium 30-100 MG CAPS Take by mouth.      . fluticasone (FLONASE) 50 MCG/ACT nasal spray Place 2 sprays into the nose daily.  16 g  5  . metoprolol tartrate (LOPRESSOR) 25 MG tablet Take one-half tablet by  mouth twice a day  90 tablet  1  . rosuvastatin (CRESTOR) 20 MG tablet 1/2 tablet q day      . triamterene-hydrochlorothiazide (MAXZIDE-25) 37.5-25 MG per tablet Take 1 tablet by mouth daily.  90 tablet  3   No current facility-administered medications on file prior to visit.    Review of Systems Patient denies any headache, lightheadedness or dizziness.  No significant sinus or allergy symptoms.  No chest pain, tightness or palpitations.  No increased shortness of breath, cough or congestion.  Breathing stable.  No  nausea or vomiting.  No acid reflux.  No abdominal pain or cramping.  No bowel change, such as diarrhea, constipation, BRBPR or melana.  No urine change.  Increased stress as outlined.  Feels she is handling things relatively well.  Is line dancing.       Objective:   Physical Exam  Filed Vitals:   08/07/13 0958  BP: 120/70  Pulse: 69  Temp: 98.6 F (37 C)   Blood pressure recheck:  4-66/36  79 year old female in no acute distress.   HEENT:  Nares- clear.  Oropharynx - without lesions. NECK:  Supple.  Nontender.  No audible bruit.  HEART:  Appears to be regular. LUNGS:  No crackles or wheezing audible.  Respirations even and unlabored.  RADIAL PULSE:  Equal bilaterally.    BREASTS:  No nipple discharge or nipple retraction present.   Could not appreciate any distinct nodules or axillary adenopathy.  ABDOMEN:  Soft, nontender.  Bowel sounds present and normal.  No audible abdominal bruit.  GU:  Not performed.    EXTREMITIES:  No increased edema present.  DP pulses palpable and equal bilaterally.          Assessment & Plan:  PREVIOUS WEIGHT LOSS.  Weight has been stable lately.  Follow.  Eating well.    FAMILY HISTORY OF OVARIAN CANCER.  Had genetic testing.  Positive.  Had ovaries removed.  Follow.    ABNORMAL CT.  CT of chest/abdomen and pelvis 09/15/08 - mild fullness in the left adrenal gland without evidence of discreet mass.  Had follow up Ct 01/14/09 - revealed adrenals to be normal.   HEALTH MAINTENANCE.  Physical today.  Pap smear 05/23/12 - ok.   Mammogram 08/14/12 - BiRADSI.  Schedule f/u mammogram.  Bone density 10/11 revealed no significant change (osteopenia).  Colonoscopy 12/12/11 - four 1-20mm polyps at the recto-sigmoid colon,, tortuous colon and diverticulosis.  Recommended follow up colonoscopy 3 years.    I spent 40 minutes with the patient and more than 50% of the time was spent in consultation regarding the above.

## 2013-08-07 NOTE — Progress Notes (Signed)
Pre-visit discussion using our clinic review tool. No additional management support is needed unless otherwise documented below in the visit note.  

## 2013-08-08 ENCOUNTER — Encounter: Payer: Self-pay | Admitting: *Deleted

## 2013-08-08 LAB — VITAMIN D 25 HYDROXY (VIT D DEFICIENCY, FRACTURES): Vit D, 25-Hydroxy: 44 ng/mL (ref 30–89)

## 2013-08-10 ENCOUNTER — Encounter: Payer: Self-pay | Admitting: Internal Medicine

## 2013-08-10 DIAGNOSIS — F05 Delirium due to known physiological condition: Secondary | ICD-10-CM | POA: Insufficient documentation

## 2013-08-10 NOTE — Assessment & Plan Note (Signed)
Breathing stable.  Seeing Dr Raul Del.  He is following CXRs

## 2013-08-10 NOTE — Assessment & Plan Note (Signed)
Pt describes the event where surroundings were unfamiliar.  She knew who she was and why she was at the store.  Just the her surroundings were unfamiliar to her.  The episode lasted < 30 minutes.  No other focal neuro deficits or changes.  Unclear as to the exact etiology.  We discussed possible TIA.  She is under a lot of stress.  Feels she is handling things relatively well.  Will restart her aspirin.  She has been off for years.  Check carotid ultrasound and MRI.  Further w/up pending results.   She was informed that if she had any reoccurring episode or any other acute change, that she was to be evaluated immediately.

## 2013-08-10 NOTE — Assessment & Plan Note (Signed)
Labs bone density revealed osteopenia.  Follow.  Continue calcium, vitamin D and weight bearing exercises.

## 2013-08-10 NOTE — Assessment & Plan Note (Signed)
Colonoscopy as outlined.  Recommended f/u colonoscopy three years from 7/13 colonoscopy.

## 2013-08-10 NOTE — Assessment & Plan Note (Signed)
Asymptomatic.  Sees Dr Saralyn Pilar.  Doing well.  No cardiac symptoms with increased activity and exertion.  Stress test 9/13 - negative.

## 2013-08-10 NOTE — Assessment & Plan Note (Signed)
Followed by Dr Cope.  

## 2013-08-10 NOTE — Assessment & Plan Note (Signed)
Blood pressure per her report has been under reasonable control.  Checks here as outlined.  Follow.  Follow metabolic panel.

## 2013-08-10 NOTE — Assessment & Plan Note (Signed)
Followed by Dr Cynda Acres.

## 2013-08-10 NOTE — Assessment & Plan Note (Signed)
Low carb diet and exercise.  Follow metabolic panel and [id number].

## 2013-08-10 NOTE — Assessment & Plan Note (Signed)
On crestor.  Follow lipid panel and liver function.

## 2013-08-14 DIAGNOSIS — I6529 Occlusion and stenosis of unspecified carotid artery: Secondary | ICD-10-CM | POA: Diagnosis not present

## 2013-08-19 ENCOUNTER — Ambulatory Visit: Payer: Self-pay | Admitting: Internal Medicine

## 2013-08-19 DIAGNOSIS — R4182 Altered mental status, unspecified: Secondary | ICD-10-CM | POA: Diagnosis not present

## 2013-08-19 DIAGNOSIS — J329 Chronic sinusitis, unspecified: Secondary | ICD-10-CM | POA: Diagnosis not present

## 2013-08-19 DIAGNOSIS — F29 Unspecified psychosis not due to a substance or known physiological condition: Secondary | ICD-10-CM | POA: Diagnosis not present

## 2013-08-20 ENCOUNTER — Encounter: Payer: Self-pay | Admitting: *Deleted

## 2013-08-26 DIAGNOSIS — J449 Chronic obstructive pulmonary disease, unspecified: Secondary | ICD-10-CM | POA: Diagnosis not present

## 2013-09-10 DIAGNOSIS — Z8262 Family history of osteoporosis: Secondary | ICD-10-CM | POA: Diagnosis not present

## 2013-09-10 DIAGNOSIS — Q618 Other cystic kidney diseases: Secondary | ICD-10-CM | POA: Diagnosis not present

## 2013-09-10 DIAGNOSIS — Z87891 Personal history of nicotine dependence: Secondary | ICD-10-CM | POA: Diagnosis not present

## 2013-09-10 DIAGNOSIS — J449 Chronic obstructive pulmonary disease, unspecified: Secondary | ICD-10-CM | POA: Diagnosis not present

## 2013-09-10 DIAGNOSIS — N393 Stress incontinence (female) (male): Secondary | ICD-10-CM | POA: Diagnosis not present

## 2013-09-10 DIAGNOSIS — Z8041 Family history of malignant neoplasm of ovary: Secondary | ICD-10-CM | POA: Diagnosis not present

## 2013-09-10 DIAGNOSIS — Z800 Family history of malignant neoplasm of digestive organs: Secondary | ICD-10-CM | POA: Diagnosis not present

## 2013-09-10 DIAGNOSIS — Z85828 Personal history of other malignant neoplasm of skin: Secondary | ICD-10-CM | POA: Diagnosis not present

## 2013-09-10 DIAGNOSIS — N281 Cyst of kidney, acquired: Secondary | ICD-10-CM | POA: Diagnosis not present

## 2013-09-10 DIAGNOSIS — Z86010 Personal history of colonic polyps: Secondary | ICD-10-CM | POA: Diagnosis not present

## 2013-09-12 ENCOUNTER — Ambulatory Visit: Payer: Self-pay | Admitting: Internal Medicine

## 2013-09-12 DIAGNOSIS — Z79899 Other long term (current) drug therapy: Secondary | ICD-10-CM | POA: Diagnosis not present

## 2013-09-12 DIAGNOSIS — Z800 Family history of malignant neoplasm of digestive organs: Secondary | ICD-10-CM | POA: Diagnosis not present

## 2013-09-12 DIAGNOSIS — Z8041 Family history of malignant neoplasm of ovary: Secondary | ICD-10-CM | POA: Diagnosis not present

## 2013-09-12 DIAGNOSIS — Q619 Cystic kidney disease, unspecified: Secondary | ICD-10-CM | POA: Diagnosis not present

## 2013-09-12 DIAGNOSIS — Z808 Family history of malignant neoplasm of other organs or systems: Secondary | ICD-10-CM | POA: Diagnosis not present

## 2013-09-12 DIAGNOSIS — J449 Chronic obstructive pulmonary disease, unspecified: Secondary | ICD-10-CM | POA: Diagnosis not present

## 2013-09-12 DIAGNOSIS — D45 Polycythemia vera: Secondary | ICD-10-CM | POA: Diagnosis not present

## 2013-09-12 DIAGNOSIS — D72829 Elevated white blood cell count, unspecified: Secondary | ICD-10-CM | POA: Diagnosis not present

## 2013-09-12 DIAGNOSIS — Z803 Family history of malignant neoplasm of breast: Secondary | ICD-10-CM | POA: Diagnosis not present

## 2013-09-12 DIAGNOSIS — Z86010 Personal history of colonic polyps: Secondary | ICD-10-CM | POA: Diagnosis not present

## 2013-09-12 DIAGNOSIS — Z87891 Personal history of nicotine dependence: Secondary | ICD-10-CM | POA: Diagnosis not present

## 2013-09-19 DIAGNOSIS — D72829 Elevated white blood cell count, unspecified: Secondary | ICD-10-CM | POA: Diagnosis not present

## 2013-09-19 DIAGNOSIS — D45 Polycythemia vera: Secondary | ICD-10-CM | POA: Diagnosis not present

## 2013-09-19 LAB — CBC CANCER CENTER
BASOS PCT: 0.2 %
Basophil #: 0 x10 3/mm (ref 0.0–0.1)
EOS ABS: 0.4 x10 3/mm (ref 0.0–0.7)
Eosinophil %: 4.5 %
HCT: 44.9 % (ref 35.0–47.0)
HGB: 14.9 g/dL (ref 12.0–16.0)
Lymphocyte #: 1.6 x10 3/mm (ref 1.0–3.6)
Lymphocyte %: 20.7 %
MCH: 28.2 pg (ref 26.0–34.0)
MCHC: 33.2 g/dL (ref 32.0–36.0)
MCV: 85 fL (ref 80–100)
MONOS PCT: 9.7 %
Monocyte #: 0.8 x10 3/mm (ref 0.2–0.9)
NEUTROS ABS: 5.2 x10 3/mm (ref 1.4–6.5)
NEUTROS PCT: 64.9 %
Platelet: 227 x10 3/mm (ref 150–440)
RBC: 5.3 10*6/uL — AB (ref 3.80–5.20)
RDW: 15.8 % — ABNORMAL HIGH (ref 11.5–14.5)
WBC: 8 x10 3/mm (ref 3.6–11.0)

## 2013-09-29 LAB — CANCER CENTER HEMATOCRIT: HCT: 41 % (ref 35.0–47.0)

## 2013-09-30 ENCOUNTER — Other Ambulatory Visit: Payer: Self-pay | Admitting: Internal Medicine

## 2013-09-30 DIAGNOSIS — Z9861 Coronary angioplasty status: Secondary | ICD-10-CM | POA: Diagnosis not present

## 2013-09-30 DIAGNOSIS — R002 Palpitations: Secondary | ICD-10-CM | POA: Diagnosis not present

## 2013-09-30 DIAGNOSIS — N393 Stress incontinence (female) (male): Secondary | ICD-10-CM | POA: Diagnosis not present

## 2013-09-30 DIAGNOSIS — I10 Essential (primary) hypertension: Secondary | ICD-10-CM | POA: Diagnosis not present

## 2013-09-30 DIAGNOSIS — Z955 Presence of coronary angioplasty implant and graft: Secondary | ICD-10-CM | POA: Insufficient documentation

## 2013-10-10 ENCOUNTER — Encounter: Payer: Self-pay | Admitting: Internal Medicine

## 2013-10-13 ENCOUNTER — Ambulatory Visit: Payer: Self-pay | Admitting: Internal Medicine

## 2013-10-14 ENCOUNTER — Ambulatory Visit: Payer: Medicare Other | Admitting: Internal Medicine

## 2013-10-14 ENCOUNTER — Encounter: Payer: Self-pay | Admitting: Internal Medicine

## 2013-10-28 ENCOUNTER — Ambulatory Visit: Payer: Self-pay | Admitting: Internal Medicine

## 2013-10-28 ENCOUNTER — Encounter: Payer: Self-pay | Admitting: *Deleted

## 2013-10-28 DIAGNOSIS — Z1231 Encounter for screening mammogram for malignant neoplasm of breast: Secondary | ICD-10-CM | POA: Diagnosis not present

## 2013-10-28 LAB — HM MAMMOGRAPHY

## 2013-11-13 ENCOUNTER — Ambulatory Visit: Payer: Self-pay | Admitting: Family Medicine

## 2013-11-13 DIAGNOSIS — I2510 Atherosclerotic heart disease of native coronary artery without angina pectoris: Secondary | ICD-10-CM | POA: Diagnosis not present

## 2013-11-13 DIAGNOSIS — J438 Other emphysema: Secondary | ICD-10-CM | POA: Diagnosis not present

## 2013-11-13 DIAGNOSIS — Z87891 Personal history of nicotine dependence: Secondary | ICD-10-CM | POA: Diagnosis not present

## 2013-11-13 DIAGNOSIS — Z122 Encounter for screening for malignant neoplasm of respiratory organs: Secondary | ICD-10-CM | POA: Diagnosis not present

## 2013-11-21 ENCOUNTER — Ambulatory Visit: Payer: Self-pay | Admitting: Internal Medicine

## 2013-11-21 DIAGNOSIS — D72829 Elevated white blood cell count, unspecified: Secondary | ICD-10-CM | POA: Diagnosis not present

## 2013-11-21 DIAGNOSIS — D45 Polycythemia vera: Secondary | ICD-10-CM | POA: Diagnosis not present

## 2013-11-21 DIAGNOSIS — Z8041 Family history of malignant neoplasm of ovary: Secondary | ICD-10-CM | POA: Diagnosis not present

## 2013-11-21 DIAGNOSIS — Z803 Family history of malignant neoplasm of breast: Secondary | ICD-10-CM | POA: Diagnosis not present

## 2013-11-21 DIAGNOSIS — Z800 Family history of malignant neoplasm of digestive organs: Secondary | ICD-10-CM | POA: Diagnosis not present

## 2013-11-21 DIAGNOSIS — Z79899 Other long term (current) drug therapy: Secondary | ICD-10-CM | POA: Diagnosis not present

## 2013-11-21 DIAGNOSIS — Q619 Cystic kidney disease, unspecified: Secondary | ICD-10-CM | POA: Diagnosis not present

## 2013-11-21 DIAGNOSIS — Z87891 Personal history of nicotine dependence: Secondary | ICD-10-CM | POA: Diagnosis not present

## 2013-11-21 DIAGNOSIS — J449 Chronic obstructive pulmonary disease, unspecified: Secondary | ICD-10-CM | POA: Diagnosis not present

## 2013-11-21 DIAGNOSIS — Z86010 Personal history of colonic polyps: Secondary | ICD-10-CM | POA: Diagnosis not present

## 2013-11-21 DIAGNOSIS — F29 Unspecified psychosis not due to a substance or known physiological condition: Secondary | ICD-10-CM | POA: Diagnosis not present

## 2013-11-21 LAB — CBC CANCER CENTER
BASOS PCT: 0.4 %
Basophil #: 0 x10 3/mm (ref 0.0–0.1)
EOS ABS: 0.2 x10 3/mm (ref 0.0–0.7)
EOS PCT: 2.2 %
HCT: 45.1 % (ref 35.0–47.0)
HGB: 14.8 g/dL (ref 12.0–16.0)
Lymphocyte #: 1.6 x10 3/mm (ref 1.0–3.6)
Lymphocyte %: 19.7 %
MCH: 28.2 pg (ref 26.0–34.0)
MCHC: 32.8 g/dL (ref 32.0–36.0)
MCV: 86 fL (ref 80–100)
MONO ABS: 0.7 x10 3/mm (ref 0.2–0.9)
MONOS PCT: 8.2 %
NEUTROS ABS: 5.6 x10 3/mm (ref 1.4–6.5)
Neutrophil %: 69.5 %
Platelet: 241 x10 3/mm (ref 150–440)
RBC: 5.23 10*6/uL — ABNORMAL HIGH (ref 3.80–5.20)
RDW: 15.1 % — ABNORMAL HIGH (ref 11.5–14.5)
WBC: 8.1 x10 3/mm (ref 3.6–11.0)

## 2013-11-28 ENCOUNTER — Telehealth: Payer: Self-pay | Admitting: Internal Medicine

## 2013-11-28 ENCOUNTER — Telehealth: Payer: Self-pay | Admitting: *Deleted

## 2013-11-28 MED ORDER — ROSUVASTATIN CALCIUM 20 MG PO TABS: ORAL_TABLET | ORAL | Status: DC

## 2013-11-28 NOTE — Telephone Encounter (Signed)
Sent in refill crestor #90 with no refills to [facility].  Sent message to [facility] to schedule a f/u appt.

## 2013-11-28 NOTE — Telephone Encounter (Signed)
Pt does not have a f/u appt scheduled.  Please schedule her a f/u appt with me first available (30 minute).  Thanks.  Dr Nicki Reaper

## 2013-11-28 NOTE — Telephone Encounter (Signed)
Fax from pharmacy requesting Crestor 20mg  #30.  Medication from historical provider.  Last OV 3.26.15.  Please advise refill

## 2013-12-01 NOTE — Telephone Encounter (Signed)
The patient is needing this medication sent to [facility]

## 2013-12-13 ENCOUNTER — Ambulatory Visit: Payer: Self-pay | Admitting: Internal Medicine

## 2013-12-13 DIAGNOSIS — D72829 Elevated white blood cell count, unspecified: Secondary | ICD-10-CM | POA: Diagnosis not present

## 2013-12-13 DIAGNOSIS — D45 Polycythemia vera: Secondary | ICD-10-CM | POA: Diagnosis not present

## 2013-12-26 DIAGNOSIS — D45 Polycythemia vera: Secondary | ICD-10-CM | POA: Diagnosis not present

## 2013-12-26 LAB — CANCER CENTER HEMATOCRIT: HCT: 44.8 % (ref 35.0–47.0)

## 2014-01-13 ENCOUNTER — Ambulatory Visit: Payer: Self-pay | Admitting: Internal Medicine

## 2014-01-13 DIAGNOSIS — D72829 Elevated white blood cell count, unspecified: Secondary | ICD-10-CM | POA: Diagnosis not present

## 2014-01-13 DIAGNOSIS — D45 Polycythemia vera: Secondary | ICD-10-CM | POA: Diagnosis not present

## 2014-01-16 DIAGNOSIS — Z23 Encounter for immunization: Secondary | ICD-10-CM | POA: Diagnosis not present

## 2014-01-22 ENCOUNTER — Other Ambulatory Visit: Payer: Self-pay | Admitting: Internal Medicine

## 2014-01-23 LAB — CANCER CENTER HEMATOCRIT: HCT: 44.6 % (ref 35.0–47.0)

## 2014-02-05 ENCOUNTER — Encounter: Payer: Self-pay | Admitting: Internal Medicine

## 2014-02-05 ENCOUNTER — Ambulatory Visit (INDEPENDENT_AMBULATORY_CARE_PROVIDER_SITE_OTHER): Payer: Medicare Other | Admitting: Internal Medicine

## 2014-02-05 VITALS — BP 138/68 | HR 64 | Temp 98.1°F | Ht 64.5 in | Wt 136.8 lb

## 2014-02-05 DIAGNOSIS — Z23 Encounter for immunization: Secondary | ICD-10-CM | POA: Diagnosis not present

## 2014-02-05 DIAGNOSIS — M810 Age-related osteoporosis without current pathological fracture: Secondary | ICD-10-CM

## 2014-02-05 DIAGNOSIS — I10 Essential (primary) hypertension: Secondary | ICD-10-CM

## 2014-02-05 DIAGNOSIS — F05 Delirium due to known physiological condition: Secondary | ICD-10-CM | POA: Diagnosis not present

## 2014-02-05 DIAGNOSIS — N281 Cyst of kidney, acquired: Secondary | ICD-10-CM

## 2014-02-05 DIAGNOSIS — R739 Hyperglycemia, unspecified: Secondary | ICD-10-CM

## 2014-02-05 DIAGNOSIS — I2510 Atherosclerotic heart disease of native coronary artery without angina pectoris: Secondary | ICD-10-CM

## 2014-02-05 DIAGNOSIS — R7309 Other abnormal glucose: Secondary | ICD-10-CM

## 2014-02-05 DIAGNOSIS — Z86010 Personal history of colonic polyps: Secondary | ICD-10-CM

## 2014-02-05 DIAGNOSIS — J449 Chronic obstructive pulmonary disease, unspecified: Secondary | ICD-10-CM

## 2014-02-05 DIAGNOSIS — Q619 Cystic kidney disease, unspecified: Secondary | ICD-10-CM

## 2014-02-05 DIAGNOSIS — E7800 Pure hypercholesterolemia, unspecified: Secondary | ICD-10-CM

## 2014-02-05 DIAGNOSIS — D45 Polycythemia vera: Secondary | ICD-10-CM

## 2014-02-05 NOTE — Progress Notes (Signed)
Pre visit review using our clinic review tool, if applicable. No additional management support is needed unless otherwise documented below in the visit note. 

## 2014-02-05 NOTE — Progress Notes (Signed)
Subjective:    [name], female    DOB: Aug 21, 1934, 79 y.o.   MRN: [id number]  HPI 79 year old female with past history of tobacco abuse, osteoporosis, CAD s/p stent placement, hypertension and hypercholesterolemia who comes in today for a scheduled follow up.   She states she is doing relatively well.   Breathing stable.  No cough or congestion now.  Eating well.  Seeing Dr Cynda Acres.   Still seeing Dr Raul Del.  He follows PFTs.  Some decrease in her lung function.  Back on Symbicort.  Had a colonoscopy 8/13.  Bowels stable.  Increased stress - taking care of family members.   She is the primary caretaker for her sister, who's health is declining.  She also has had some increased stress with her son-n-law - diagnosed with metastatic brain cancer.  Feels she is handling stress relatively well.  Does not feel she needs any other intervention.  Is still line dancing.  Overall she feels she is doing relatively well.  She has not had another episode where her surroundings were unfamiliar.  See last note for details.  MRI 08/14/13 - no acute abnormality.  Carotid ultrasound - no hemodynamically significant stenosis.  Discussed with her - take aspirin daily.  She desires no further evaluation or referral.     Past Medical History  Diagnosis Date  . CAD (coronary artery disease)   . Hypertension   . Hypercholesterolemia   . COPD (chronic obstructive pulmonary disease)   . Osteoporosis   . History of colon polyps   . Hyperglycemia   . Polycythemia vera(238.4)   . Renal cyst     Current Outpatient Prescriptions on File Prior to Visit  Medication Sig Dispense Refill  . albuterol (PROVENTIL HFA;VENTOLIN HFA) 108 (90 BASE) MCG/ACT inhaler Inhale 2 puffs into the lungs every 6 (six) hours as needed.      . budesonide-formoterol (SYMBICORT) 160-4.5 MCG/ACT inhaler Inhale 2 puffs into the lungs 2 (two) times daily.      . Calcium Carbonate-Vitamin D (CALCIUM 600+D) 600-200 MG-UNIT TABS Take 1  tablet by mouth 2 (two) times daily.      . fluticasone (FLONASE) 50 MCG/ACT nasal spray Place 2 sprays into the nose daily.  16 g  5  . metoprolol tartrate (LOPRESSOR) 25 MG tablet Take one-half tablet by  mouth twice a day  90 tablet  1  . rosuvastatin (CRESTOR) 20 MG tablet 1/2 tablet q day  90 tablet  0  . triamterene-hydrochlorothiazide (MAXZIDE-25) 37.5-25 MG per tablet Take 1 tablet by mouth  daily  90 tablet  2   No current facility-administered medications on file prior to visit.    Review of Systems Patient denies any headache, lightheadedness or dizziness.  No significant sinus or allergy symptoms.  No chest pain, tightness or palpitations.  No increased shortness of breath, cough or congestion.  Breathing stable.  No nausea or vomiting.  No acid reflux.  No abdominal pain or cramping.  No bowel change, such as diarrhea, constipation, BRBPR or melana.  No urine change.  Increased stress as outlined.  Feels she is handling things relatively well.  Is line dancing.       Objective:   Physical Exam  Filed Vitals:   02/05/14 1502  BP: 120/70  Pulse: 64  Temp: 98.1 F (36.7 C)   Blood pressure recheck:  67/36  79 year old female in no acute distress.   HEENT:  Nares- clear.  Oropharynx -  without lesions. NECK:  Supple.  Nontender.  No audible bruit.  HEART:  Appears to be regular. LUNGS:  No crackles or wheezing audible.  Respirations even and unlabored.  RADIAL PULSE:  Equal bilaterally.  ABDOMEN:  Soft, nontender.  Bowel sounds present and normal.  No audible abdominal bruit.   EXTREMITIES:  No increased edema present.  DP pulses palpable and equal bilaterally.          Assessment & Plan:  PREVIOUS WEIGHT LOSS.  Weight has been stable lately.  Follow.  Eating well.    FAMILY HISTORY OF OVARIAN CANCER.  Had genetic testing.  Positive.  Had ovaries removed.  Follow.    ABNORMAL CT.  CT of chest/abdomen and pelvis 09/15/08 - mild fullness in the left adrenal gland without  evidence of discreet mass.  Had follow up Ct 01/14/09 - revealed adrenals to be normal.   HEALTH MAINTENANCE.  Physical 08/07/13.   Pap smear 05/23/12 - ok.   Mammogram 10/28/13 - BiRADS I.  Bone density 10/11 revealed no significant change (osteopenia).  Colonoscopy 12/12/11 - four 1-54mm polyps at the recto-sigmoid colon,, tortuous colon and diverticulosis.  Recommended follow up colonoscopy 3 years.    I spent 25 minutes with the patient and more than 50% of the time was spent in consultation regarding the above.

## 2014-02-08 ENCOUNTER — Encounter: Payer: Self-pay | Admitting: Internal Medicine

## 2014-02-08 NOTE — Assessment & Plan Note (Signed)
Asymptomatic.  Sees Dr Saralyn Pilar.  Doing well.  No cardiac symptoms with increased activity and exertion.  Stress test 9/13 - negative.

## 2014-02-08 NOTE — Assessment & Plan Note (Signed)
MRI brain revealed no acute abnormality.  Carotid ultrasound revealed no hemodynamically significant stenosis.  Has had no reoccurrence of symptoms.  Declines any further w/up or evaluation.  Aspirin daily.  Follow.

## 2014-02-08 NOTE — Assessment & Plan Note (Signed)
Blood pressure per her report has been under reasonable control.  Checks here as outlined.  Follow.  Follow metabolic panel.

## 2014-02-08 NOTE — Assessment & Plan Note (Signed)
Followed by Dr Cope.  

## 2014-02-08 NOTE — Assessment & Plan Note (Signed)
Low carb diet and exercise.  Follow metabolic panel and [id number].

## 2014-02-08 NOTE — Assessment & Plan Note (Signed)
Labs bone density revealed osteopenia.  Follow.  Continue calcium, vitamin D and weight bearing exercises.

## 2014-02-08 NOTE — Assessment & Plan Note (Signed)
On crestor.  Follow lipid panel and liver function.

## 2014-02-08 NOTE — Assessment & Plan Note (Signed)
Breathing stable.  Seeing Dr Raul Del.  He is following CXRs

## 2014-02-08 NOTE — Assessment & Plan Note (Signed)
Colonoscopy as outlined.  Recommended f/u colonoscopy three years from 7/13 colonoscopy.

## 2014-02-08 NOTE — Assessment & Plan Note (Signed)
Followed by Dr Cynda Acres.

## 2014-02-12 ENCOUNTER — Ambulatory Visit: Payer: Self-pay | Admitting: Internal Medicine

## 2014-02-12 DIAGNOSIS — Z85828 Personal history of other malignant neoplasm of skin: Secondary | ICD-10-CM | POA: Diagnosis not present

## 2014-02-12 DIAGNOSIS — Z7982 Long term (current) use of aspirin: Secondary | ICD-10-CM | POA: Diagnosis not present

## 2014-02-12 DIAGNOSIS — J45909 Unspecified asthma, uncomplicated: Secondary | ICD-10-CM | POA: Diagnosis not present

## 2014-02-12 DIAGNOSIS — Z800 Family history of malignant neoplasm of digestive organs: Secondary | ICD-10-CM | POA: Diagnosis not present

## 2014-02-12 DIAGNOSIS — Z79899 Other long term (current) drug therapy: Secondary | ICD-10-CM | POA: Diagnosis not present

## 2014-02-12 DIAGNOSIS — Z87891 Personal history of nicotine dependence: Secondary | ICD-10-CM | POA: Diagnosis not present

## 2014-02-12 DIAGNOSIS — D72829 Elevated white blood cell count, unspecified: Secondary | ICD-10-CM | POA: Diagnosis not present

## 2014-02-12 DIAGNOSIS — Z951 Presence of aortocoronary bypass graft: Secondary | ICD-10-CM | POA: Diagnosis not present

## 2014-02-12 DIAGNOSIS — Z8489 Family history of other specified conditions: Secondary | ICD-10-CM | POA: Diagnosis not present

## 2014-02-12 DIAGNOSIS — Z8041 Family history of malignant neoplasm of ovary: Secondary | ICD-10-CM | POA: Diagnosis not present

## 2014-02-12 DIAGNOSIS — J449 Chronic obstructive pulmonary disease, unspecified: Secondary | ICD-10-CM | POA: Diagnosis not present

## 2014-02-12 DIAGNOSIS — Z803 Family history of malignant neoplasm of breast: Secondary | ICD-10-CM | POA: Diagnosis not present

## 2014-02-12 DIAGNOSIS — I10 Essential (primary) hypertension: Secondary | ICD-10-CM | POA: Diagnosis not present

## 2014-02-12 DIAGNOSIS — Z808 Family history of malignant neoplasm of other organs or systems: Secondary | ICD-10-CM | POA: Diagnosis not present

## 2014-02-12 DIAGNOSIS — J439 Emphysema, unspecified: Secondary | ICD-10-CM | POA: Diagnosis not present

## 2014-02-12 DIAGNOSIS — N281 Cyst of kidney, acquired: Secondary | ICD-10-CM | POA: Diagnosis not present

## 2014-02-12 DIAGNOSIS — Z148 Genetic carrier of other disease: Secondary | ICD-10-CM | POA: Diagnosis not present

## 2014-02-12 DIAGNOSIS — D45 Polycythemia vera: Secondary | ICD-10-CM | POA: Diagnosis not present

## 2014-02-12 DIAGNOSIS — I2510 Atherosclerotic heart disease of native coronary artery without angina pectoris: Secondary | ICD-10-CM | POA: Diagnosis not present

## 2014-02-18 DIAGNOSIS — I10 Essential (primary) hypertension: Secondary | ICD-10-CM | POA: Diagnosis not present

## 2014-02-18 DIAGNOSIS — Z79899 Other long term (current) drug therapy: Secondary | ICD-10-CM | POA: Diagnosis not present

## 2014-02-18 DIAGNOSIS — E780 Pure hypercholesterolemia: Secondary | ICD-10-CM | POA: Diagnosis not present

## 2014-02-18 DIAGNOSIS — R7989 Other specified abnormal findings of blood chemistry: Secondary | ICD-10-CM | POA: Diagnosis not present

## 2014-02-19 LAB — LIPID PANEL
Cholesterol: 139 mg/dL (ref 0–200)
HDL: 69 mg/dL (ref 35–70)
LDL Cholesterol: 51 mg/dL
LDl/HDL Ratio: 0.7
Triglycerides: 93 mg/dL (ref 40–160)

## 2014-02-19 LAB — HEPATIC FUNCTION PANEL
ALT: 16 U/L (ref 7–35)
AST: 16 U/L (ref 13–35)
Alkaline Phosphatase: 64 U/L (ref 25–125)
Bilirubin, Direct: 0.15 mg/dL (ref 0.01–0.4)
Bilirubin, Total: 0.4 mg/dL

## 2014-02-19 LAB — BASIC METABOLIC PANEL
BUN: 19 mg/dL (ref 4–21)
Creatinine: 1.2 mg/dL — AB (ref 0.5–1.1)
Glucose: 113 mg/dL
Potassium: 3.5 mmol/L (ref 3.4–5.3)
SODIUM: 141 mmol/L (ref 137–147)

## 2014-02-19 LAB — HEMOGLOBIN A1C: Hgb A1c MFr Bld: 5.5 % (ref 4.0–6.0)

## 2014-02-20 ENCOUNTER — Encounter: Payer: Self-pay | Admitting: Internal Medicine

## 2014-02-20 DIAGNOSIS — D45 Polycythemia vera: Secondary | ICD-10-CM | POA: Diagnosis not present

## 2014-02-20 LAB — CANCER CENTER HEMATOCRIT: HCT: 45.5 % (ref 35.0–47.0)

## 2014-02-24 DIAGNOSIS — J449 Chronic obstructive pulmonary disease, unspecified: Secondary | ICD-10-CM | POA: Diagnosis not present

## 2014-03-09 DIAGNOSIS — N289 Disorder of kidney and ureter, unspecified: Secondary | ICD-10-CM | POA: Diagnosis not present

## 2014-03-09 LAB — BASIC METABOLIC PANEL
BUN: 19 mg/dL (ref 4–21)
Creatinine: 0.9 mg/dL (ref 0.5–1.1)
GLUCOSE: 108 mg/dL
POTASSIUM: 3.8 mmol/L (ref 3.4–5.3)
SODIUM: 140 mmol/L (ref 137–147)

## 2014-03-10 ENCOUNTER — Encounter: Payer: Self-pay | Admitting: Internal Medicine

## 2014-03-12 ENCOUNTER — Telehealth: Payer: Self-pay | Admitting: Internal Medicine

## 2014-03-12 NOTE — Telephone Encounter (Signed)
Notify pt that I reviewed her recent labs from [facility] and her kidney function is better/ok.    Dr Nicki Reaper

## 2014-03-12 NOTE — Telephone Encounter (Signed)
Pt.notified

## 2014-03-15 ENCOUNTER — Ambulatory Visit: Payer: Self-pay | Admitting: Internal Medicine

## 2014-03-15 DIAGNOSIS — D45 Polycythemia vera: Secondary | ICD-10-CM | POA: Diagnosis not present

## 2014-03-20 LAB — CANCER CENTER HEMATOCRIT: HCT: 44.1 % (ref 35.0–47.0)

## 2014-03-30 ENCOUNTER — Other Ambulatory Visit: Payer: Self-pay | Admitting: Internal Medicine

## 2014-03-30 ENCOUNTER — Other Ambulatory Visit: Payer: Self-pay | Admitting: *Deleted

## 2014-03-30 MED ORDER — BUDESONIDE-FORMOTEROL FUMARATE 160-4.5 MCG/ACT IN AERO: 2.0000 | INHALATION_SPRAY | Freq: Two times a day (BID) | RESPIRATORY_TRACT | Status: DC

## 2014-04-02 DIAGNOSIS — Z955 Presence of coronary angioplasty implant and graft: Secondary | ICD-10-CM | POA: Diagnosis not present

## 2014-04-02 DIAGNOSIS — I10 Essential (primary) hypertension: Secondary | ICD-10-CM | POA: Diagnosis not present

## 2014-04-02 DIAGNOSIS — R002 Palpitations: Secondary | ICD-10-CM | POA: Diagnosis not present

## 2014-04-03 ENCOUNTER — Encounter: Payer: Self-pay | Admitting: Internal Medicine

## 2014-04-14 ENCOUNTER — Ambulatory Visit: Payer: Self-pay | Admitting: Internal Medicine

## 2014-04-14 DIAGNOSIS — D45 Polycythemia vera: Secondary | ICD-10-CM | POA: Diagnosis not present

## 2014-04-16 DIAGNOSIS — R0602 Shortness of breath: Secondary | ICD-10-CM | POA: Diagnosis not present

## 2014-04-16 DIAGNOSIS — R002 Palpitations: Secondary | ICD-10-CM | POA: Diagnosis not present

## 2014-04-17 LAB — CANCER CENTER HEMATOCRIT: HCT: 41.6 % (ref 35.0–47.0)

## 2014-04-22 DIAGNOSIS — I10 Essential (primary) hypertension: Secondary | ICD-10-CM | POA: Diagnosis not present

## 2014-04-22 DIAGNOSIS — Z955 Presence of coronary angioplasty implant and graft: Secondary | ICD-10-CM | POA: Diagnosis not present

## 2014-04-22 DIAGNOSIS — R002 Palpitations: Secondary | ICD-10-CM | POA: Diagnosis not present

## 2014-04-27 ENCOUNTER — Encounter: Payer: Self-pay | Admitting: Internal Medicine

## 2014-05-15 ENCOUNTER — Ambulatory Visit: Payer: Self-pay | Admitting: Internal Medicine

## 2014-05-15 DIAGNOSIS — Z803 Family history of malignant neoplasm of breast: Secondary | ICD-10-CM | POA: Diagnosis not present

## 2014-05-15 DIAGNOSIS — I10 Essential (primary) hypertension: Secondary | ICD-10-CM | POA: Diagnosis not present

## 2014-05-15 DIAGNOSIS — Z85828 Personal history of other malignant neoplasm of skin: Secondary | ICD-10-CM | POA: Diagnosis not present

## 2014-05-15 DIAGNOSIS — Z7982 Long term (current) use of aspirin: Secondary | ICD-10-CM | POA: Diagnosis not present

## 2014-05-15 DIAGNOSIS — Z800 Family history of malignant neoplasm of digestive organs: Secondary | ICD-10-CM | POA: Diagnosis not present

## 2014-05-15 DIAGNOSIS — J45909 Unspecified asthma, uncomplicated: Secondary | ICD-10-CM | POA: Diagnosis not present

## 2014-05-15 DIAGNOSIS — D45 Polycythemia vera: Secondary | ICD-10-CM | POA: Diagnosis not present

## 2014-05-15 DIAGNOSIS — D720 Genetic anomalies of leukocytes: Secondary | ICD-10-CM | POA: Diagnosis not present

## 2014-05-15 DIAGNOSIS — Z86010 Personal history of colonic polyps: Secondary | ICD-10-CM | POA: Diagnosis not present

## 2014-05-15 DIAGNOSIS — N281 Cyst of kidney, acquired: Secondary | ICD-10-CM | POA: Diagnosis not present

## 2014-05-15 DIAGNOSIS — Z951 Presence of aortocoronary bypass graft: Secondary | ICD-10-CM | POA: Diagnosis not present

## 2014-05-15 DIAGNOSIS — J439 Emphysema, unspecified: Secondary | ICD-10-CM | POA: Diagnosis not present

## 2014-05-15 DIAGNOSIS — Z87891 Personal history of nicotine dependence: Secondary | ICD-10-CM | POA: Diagnosis not present

## 2014-05-15 DIAGNOSIS — Z79899 Other long term (current) drug therapy: Secondary | ICD-10-CM | POA: Diagnosis not present

## 2014-05-15 DIAGNOSIS — J449 Chronic obstructive pulmonary disease, unspecified: Secondary | ICD-10-CM | POA: Diagnosis not present

## 2014-05-15 DIAGNOSIS — I2510 Atherosclerotic heart disease of native coronary artery without angina pectoris: Secondary | ICD-10-CM | POA: Diagnosis not present

## 2014-05-22 DIAGNOSIS — D720 Genetic anomalies of leukocytes: Secondary | ICD-10-CM | POA: Diagnosis not present

## 2014-05-22 DIAGNOSIS — D45 Polycythemia vera: Secondary | ICD-10-CM | POA: Diagnosis not present

## 2014-05-22 DIAGNOSIS — N281 Cyst of kidney, acquired: Secondary | ICD-10-CM | POA: Diagnosis not present

## 2014-05-22 DIAGNOSIS — Z7982 Long term (current) use of aspirin: Secondary | ICD-10-CM | POA: Diagnosis not present

## 2014-05-22 DIAGNOSIS — I2510 Atherosclerotic heart disease of native coronary artery without angina pectoris: Secondary | ICD-10-CM | POA: Diagnosis not present

## 2014-05-22 DIAGNOSIS — Z79899 Other long term (current) drug therapy: Secondary | ICD-10-CM | POA: Diagnosis not present

## 2014-05-22 LAB — CANCER CENTER HEMATOCRIT: HCT: 45.5 % (ref 35.0–47.0)

## 2014-06-12 DIAGNOSIS — I2510 Atherosclerotic heart disease of native coronary artery without angina pectoris: Secondary | ICD-10-CM | POA: Diagnosis not present

## 2014-06-12 DIAGNOSIS — D720 Genetic anomalies of leukocytes: Secondary | ICD-10-CM | POA: Diagnosis not present

## 2014-06-12 DIAGNOSIS — N281 Cyst of kidney, acquired: Secondary | ICD-10-CM | POA: Diagnosis not present

## 2014-06-12 DIAGNOSIS — Z7982 Long term (current) use of aspirin: Secondary | ICD-10-CM | POA: Diagnosis not present

## 2014-06-12 DIAGNOSIS — D45 Polycythemia vera: Secondary | ICD-10-CM | POA: Diagnosis not present

## 2014-06-12 DIAGNOSIS — Z79899 Other long term (current) drug therapy: Secondary | ICD-10-CM | POA: Diagnosis not present

## 2014-06-12 LAB — CANCER CENTER HEMATOCRIT: HCT: 40 % (ref 35.0–47.0)

## 2014-06-15 ENCOUNTER — Ambulatory Visit: Payer: Self-pay | Admitting: Internal Medicine

## 2014-06-15 DIAGNOSIS — D45 Polycythemia vera: Secondary | ICD-10-CM | POA: Diagnosis not present

## 2014-07-03 DIAGNOSIS — D45 Polycythemia vera: Secondary | ICD-10-CM | POA: Diagnosis not present

## 2014-07-14 ENCOUNTER — Ambulatory Visit
Admit: 2014-07-14 | Disposition: A | Discharge status: Home / Self Care | Payer: Self-pay | Attending: Internal Medicine | Admitting: Internal Medicine

## 2014-08-11 ENCOUNTER — Encounter: Payer: Self-pay | Admitting: Internal Medicine

## 2014-08-11 ENCOUNTER — Ambulatory Visit (INDEPENDENT_AMBULATORY_CARE_PROVIDER_SITE_OTHER): Payer: Medicare Other | Admitting: Internal Medicine

## 2014-08-11 VITALS — BP 130/78 | HR 61 | Temp 97.8°F | Ht 65.0 in | Wt 144.0 lb

## 2014-08-11 DIAGNOSIS — I2510 Atherosclerotic heart disease of native coronary artery without angina pectoris: Secondary | ICD-10-CM | POA: Diagnosis not present

## 2014-08-11 DIAGNOSIS — E7800 Pure hypercholesterolemia, unspecified: Secondary | ICD-10-CM

## 2014-08-11 DIAGNOSIS — Z0000 Encounter for general adult medical examination without abnormal findings: Secondary | ICD-10-CM

## 2014-08-11 DIAGNOSIS — M810 Age-related osteoporosis without current pathological fracture: Secondary | ICD-10-CM | POA: Diagnosis not present

## 2014-08-11 DIAGNOSIS — J449 Chronic obstructive pulmonary disease, unspecified: Secondary | ICD-10-CM

## 2014-08-11 DIAGNOSIS — R3989 Other symptoms and signs involving the genitourinary system: Secondary | ICD-10-CM

## 2014-08-11 DIAGNOSIS — R5383 Other fatigue: Secondary | ICD-10-CM | POA: Diagnosis not present

## 2014-08-11 DIAGNOSIS — I10 Essential (primary) hypertension: Secondary | ICD-10-CM

## 2014-08-11 DIAGNOSIS — Q6100 Congenital renal cyst, unspecified: Secondary | ICD-10-CM

## 2014-08-11 DIAGNOSIS — D45 Polycythemia vera: Secondary | ICD-10-CM

## 2014-08-11 DIAGNOSIS — R739 Hyperglycemia, unspecified: Secondary | ICD-10-CM

## 2014-08-11 DIAGNOSIS — Z86010 Personal history of colonic polyps: Secondary | ICD-10-CM

## 2014-08-11 DIAGNOSIS — R300 Dysuria: Secondary | ICD-10-CM

## 2014-08-11 DIAGNOSIS — N3289 Other specified disorders of bladder: Secondary | ICD-10-CM

## 2014-08-11 DIAGNOSIS — N281 Cyst of kidney, acquired: Secondary | ICD-10-CM

## 2014-08-11 LAB — HEPATIC FUNCTION PANEL
ALT: 17 U/L (ref 0–35)
AST: 20 U/L (ref 0–37)
Albumin: 4.3 g/dL (ref 3.5–5.2)
Alkaline Phosphatase: 63 U/L (ref 39–117)
Bilirubin, Direct: 0.1 mg/dL (ref 0.0–0.3)
Total Bilirubin: 0.8 mg/dL (ref 0.2–1.2)
Total Protein: 7.3 g/dL (ref 6.0–8.3)

## 2014-08-11 LAB — BASIC METABOLIC PANEL
BUN: 21 mg/dL (ref 6–23)
CALCIUM: 9.8 mg/dL (ref 8.4–10.5)
CHLORIDE: 98 meq/L (ref 96–112)
CO2: 33 meq/L — AB (ref 19–32)
Creatinine, Ser: 0.96 mg/dL (ref 0.40–1.20)
GFR: 59.79 mL/min — ABNORMAL LOW (ref >60.00)
Glucose, Bld: 102 mg/dL — ABNORMAL HIGH (ref 70–99)
POTASSIUM: 3.4 meq/L — AB (ref 3.5–5.1)
Sodium: 139 mEq/L (ref 135–145)

## 2014-08-11 LAB — LIPID PANEL
Cholesterol: 166 mg/dL (ref 0–200)
HDL: 71.9 mg/dL (ref >39.00)
LDL Cholesterol: 79 mg/dL (ref 0–99)
NonHDL: 94.1
TRIGLYCERIDES: 74 mg/dL (ref 0.0–149.0)
Total CHOL/HDL Ratio: 2
VLDL: 14.8 mg/dL (ref 0.0–40.0)

## 2014-08-11 LAB — URINALYSIS, ROUTINE W REFLEX MICROSCOPIC
BILIRUBIN URINE: NEGATIVE
Ketones, ur: NEGATIVE
Leukocytes, UA: NEGATIVE
Nitrite: NEGATIVE
PH: 7.5 (ref 5.0–8.0)
Specific Gravity, Urine: 1.01 (ref 1.000–1.030)
Total Protein, Urine: NEGATIVE
URINE GLUCOSE: NEGATIVE
Urobilinogen, UA: 0.2 (ref 0.0–1.0)

## 2014-08-11 LAB — VITAMIN D 25 HYDROXY (VIT D DEFICIENCY, FRACTURES): VITD: 20.72 ng/mL — ABNORMAL LOW (ref 30.00–100.00)

## 2014-08-11 LAB — HEMOGLOBIN A1C: HEMOGLOBIN A1C: 6.2 % (ref 4.6–6.5)

## 2014-08-11 LAB — TSH: TSH: 2.16 u[IU]/mL (ref 0.35–4.50)

## 2014-08-11 NOTE — Assessment & Plan Note (Signed)
Followed by Dr Cope.  

## 2014-08-11 NOTE — Progress Notes (Signed)
Pre visit review using our clinic review tool, if applicable. No additional management support is needed unless otherwise documented below in the visit note. 

## 2014-08-11 NOTE — Progress Notes (Signed)
[name], female   DOB: 01/19/1935, 80 y.o.   MRN: [id number]   Subjective:    [name], female    DOB: 05/21/1934, 80 y.o.   MRN: [id number]  HPI  Patient here for her physical exam.  States she is doing well.  Breathing has been stable.  No increased cough or congestion.  Due to see Dr Fleming tomorrow.   Going line dancing two times per week.  No cardiac symptoms with increased activity or exertion.  Due to f/u at the cancer center end of week.  She does report some urinary pressure.  No hematuria.  No abdominal pain.  No vomiting.  No increased frequency.  Symptoms present for three weeks.  Overall she feels things are stable.     Past Medical History  Diagnosis Date  . CAD (coronary artery disease)   . Hypertension   . Hypercholesterolemia   . COPD (chronic obstructive pulmonary disease)   . Osteoporosis   . History of colon polyps   . Hyperglycemia   . Polycythemia vera(238.4)   . Renal cyst     Current Outpatient Prescriptions on File Prior to Visit  Medication Sig Dispense Refill  . albuterol (PROVENTIL HFA;VENTOLIN HFA) 108 (90 BASE) MCG/ACT inhaler Inhale 2 puffs into the lungs every 6 (six) hours as needed.    . budesonide-formoterol (SYMBICORT) 160-4.5 MCG/ACT inhaler Inhale 2 puffs into the lungs 2 (two) times daily. 1 Inhaler 5  . Calcium Carbonate-Vitamin D (CALCIUM 600+D) 600-200 MG-UNIT TABS Take 1 tablet by mouth 2 (two) times daily.    . fluticasone (FLONASE) 50 MCG/ACT nasal spray Place 2 sprays into the nose daily. 16 g 5  . metoprolol tartrate (LOPRESSOR) 25 MG tablet Take one-half tablet by  mouth twice a day 90 tablet 1  . triamterene-hydrochlorothiazide (MAXZIDE-25) 37.5-25 MG per tablet Take 1 tablet by mouth  daily 90 tablet 2   No current facility-administered medications on file prior to visit.    Review of Systems  Constitutional: Negative for appetite change and unexpected weight change.  HENT: Negative for congestion  and sinus pressure.   Respiratory: Negative for cough, chest tightness and shortness of breath.   Cardiovascular: Negative for chest pain, palpitations and leg swelling.  Gastrointestinal: Negative for nausea, vomiting, abdominal pain and diarrhea.  Genitourinary:       Reports noticing urinary pressure.  No increased frequency.  No increased nocturia.   Musculoskeletal: Negative for joint swelling.  Skin: Negative for color change and rash.  Neurological: Negative for dizziness, light-headedness and headaches.  Psychiatric/Behavioral: Negative for dysphoric mood and agitation.       Objective:    Physical Exam  Constitutional: She is oriented to person, place, and time. She appears well-developed and well-nourished. No distress.  HENT:  Nose: Nose normal.  Mouth/Throat: Oropharynx is clear and moist.  Eyes: Right eye exhibits no discharge. Left eye exhibits no discharge. No scleral icterus.  Neck: Neck supple. No thyromegaly present.  Cardiovascular: Normal rate and regular rhythm.   Pulmonary/Chest: Breath sounds normal. No accessory muscle usage. No tachypnea. No respiratory distress. She has no decreased breath sounds. She has no wheezes. She has no rhonchi. Right breast exhibits no inverted nipple, no mass, no nipple discharge and no tenderness (no axillary adenopathy). Left breast exhibits no inverted nipple, no mass, no nipple discharge and no tenderness (no axilarry adenopathy).  Abdominal: Soft. Bowel sounds are normal. There is no tenderness.  Musculoskeletal: She exhibits   no edema or tenderness.  Lymphadenopathy:    She has no cervical adenopathy.  Neurological: She is alert and oriented to person, place, and time.  Skin: Skin is warm. No rash noted.  Psychiatric: She has a normal mood and affect. Her behavior is normal.    BP 130/78 mmHg  Pulse 61  Temp(Src) 97.8 F (36.6 C) (Oral)  Ht 5' 5" (1.651 m)  Wt 144 lb (65.318 kg)  BMI 23.96 kg/m2  SpO2 96% Wt Readings  from Last 3 Encounters:  08/11/14 144 lb (65.318 kg)  02/05/14 136 lb 12 oz (62.029 kg)  08/07/13 132 lb 8 oz (60.102 kg)     Lab Results  Component Value Date   GLUCOSE 102* 08/11/2014   CHOL 166 08/11/2014   TRIG 74.0 08/11/2014   HDL 71.90 08/11/2014   LDLCALC 79 08/11/2014   ALT 17 08/11/2014   AST 20 08/11/2014   NA 139 08/11/2014   K 3.4* 08/11/2014   CL 98 08/11/2014   CREATININE 0.96 08/11/2014   BUN 21 08/11/2014   CO2 33* 08/11/2014   TSH 2.16 08/11/2014   HGBA1C 6.2 08/11/2014       Assessment & Plan:   Problem List Items Addressed This Visit    CAD (coronary artery disease)    Asymptomatic.  Sees Dr Paraschos.   No cardiac symptoms with increased activity or exertion.        Relevant Medications   lovastatin (MEVACOR) 40 MG tablet   COPD (chronic obstructive pulmonary disease)    Breathing stable.  Seeing Dr Fleming.        Health care maintenance    Physical today.  Mammogram 10/28/13 - Birads I.  Colonoscopy 12/12/11 as outlined.  Recommended f/u colonoscopy in 3 years.        History of colonic polyps    Colonoscopy 12/12/11 as outlined in overview.  Recommended f/u colonoscopy in 3 years.        Hypercholesterolemia    On cholesterol medication.  Follow lipid panel and liver function tests.        Relevant Medications   lovastatin (MEVACOR) 40 MG tablet   Other Relevant Orders   Lipid panel (Completed)   Hepatic function panel (Completed)   Hyperglycemia    Low carb diet and exercise.  Follow met b and a1c.       Relevant Orders   Hemoglobin A1c (Completed)   Basic metabolic panel (Completed)   Hypertension - Primary    Blood pressure as outlined.  Follow pressures.  Follow metabolic panel.  Continue same medication regimen.        Relevant Medications   lovastatin (MEVACOR) 40 MG tablet   Osteoporosis    Continue calcium, vitamin D supplements and weight bearing exercise.   Recheck bone density.        Relevant Orders   Vit D   25 hydroxy (rtn osteoporosis monitoring) (Completed)   DG Bone Density   Polycythemia vera    Followed by Dr Gitten.        Renal cyst    Followed by Dr Cope.        Sensation of pressure in bladder area    Symptoms as outlined.  Check urinalysis and urine culture.         Other Visit Diagnoses    Dysuria        Relevant Orders    Urinalysis, Routine w reflex microscopic (Completed)    CULTURE, URINE COMPREHENSIVE (Completed)      Other fatigue        Relevant Orders    TSH (Completed)      I spent 25 minutes with the patient and more than 50% of the time was spent in consultation regarding the above.     , , MD   

## 2014-08-12 DIAGNOSIS — J449 Chronic obstructive pulmonary disease, unspecified: Secondary | ICD-10-CM | POA: Diagnosis not present

## 2014-08-12 DIAGNOSIS — R0602 Shortness of breath: Secondary | ICD-10-CM | POA: Diagnosis not present

## 2014-08-12 LAB — CULTURE, URINE COMPREHENSIVE
COLONY COUNT: NO GROWTH
ORGANISM ID, BACTERIA: NO GROWTH

## 2014-08-14 ENCOUNTER — Ambulatory Visit
Admit: 2014-08-14 | Disposition: A | Discharge status: Home / Self Care | Payer: Self-pay | Attending: Internal Medicine | Admitting: Internal Medicine

## 2014-08-14 DIAGNOSIS — D45 Polycythemia vera: Secondary | ICD-10-CM | POA: Diagnosis not present

## 2014-08-14 DIAGNOSIS — D72828 Other elevated white blood cell count: Secondary | ICD-10-CM | POA: Diagnosis not present

## 2014-08-14 LAB — CBC CANCER CENTER
Basophil #: 0 x10 3/mm (ref 0.0–0.1)
Basophil %: 0.3 %
EOS ABS: 0.3 x10 3/mm (ref 0.0–0.7)
Eosinophil %: 3.2 %
HCT: 40.6 % (ref 35.0–47.0)
HGB: 13.2 g/dL (ref 12.0–16.0)
LYMPHS PCT: 20.7 %
Lymphocyte #: 1.7 x10 3/mm (ref 1.0–3.6)
MCH: 25.3 pg — ABNORMAL LOW (ref 26.0–34.0)
MCHC: 32.5 g/dL (ref 32.0–36.0)
MCV: 78 fL — ABNORMAL LOW (ref 80–100)
MONO ABS: 0.8 x10 3/mm (ref 0.2–0.9)
Monocyte %: 10.3 %
NEUTROS ABS: 5.3 x10 3/mm (ref 1.4–6.5)
NEUTROS PCT: 65.5 %
PLATELETS: 285 x10 3/mm (ref 150–440)
RBC: 5.22 10*6/uL — AB (ref 3.80–5.20)
RDW: 15.7 % — ABNORMAL HIGH (ref 11.5–14.5)
WBC: 8.1 x10 3/mm (ref 3.6–11.0)

## 2014-08-15 ENCOUNTER — Encounter: Payer: Self-pay | Admitting: Internal Medicine

## 2014-08-15 DIAGNOSIS — R3989 Other symptoms and signs involving the genitourinary system: Secondary | ICD-10-CM | POA: Insufficient documentation

## 2014-08-15 DIAGNOSIS — Z0000 Encounter for general adult medical examination without abnormal findings: Secondary | ICD-10-CM | POA: Insufficient documentation

## 2014-08-15 NOTE — Assessment & Plan Note (Signed)
Breathing stable.  Seeing Dr Raul Del.

## 2014-08-15 NOTE — Assessment & Plan Note (Signed)
Low carb diet and exercise.  Follow met b and a1c.  

## 2014-08-15 NOTE — Assessment & Plan Note (Signed)
Followed by Dr Cynda Acres.

## 2014-08-15 NOTE — Assessment & Plan Note (Signed)
Symptoms as outlined.  Check urinalysis and urine culture.

## 2014-08-15 NOTE — Assessment & Plan Note (Signed)
Continue calcium, vitamin D supplements and weight bearing exercise.   Recheck bone density.

## 2014-08-15 NOTE — Assessment & Plan Note (Signed)
Asymptomatic.  Sees Dr Saralyn Pilar.   No cardiac symptoms with increased activity or exertion.

## 2014-08-15 NOTE — Assessment & Plan Note (Signed)
On cholesterol medication.  Follow lipid panel and liver function tests.

## 2014-08-15 NOTE — Assessment & Plan Note (Signed)
Blood pressure as outlined.  Follow pressures.  Follow metabolic panel.  Continue same medication regimen.

## 2014-08-15 NOTE — Assessment & Plan Note (Signed)
Physical today.  Mammogram 10/28/13 - Birads I.  Colonoscopy 12/12/11 as outlined.  Recommended f/u colonoscopy in 3 years.

## 2014-08-15 NOTE — Assessment & Plan Note (Signed)
Colonoscopy 12/12/11 as outlined in overview.  Recommended f/u colonoscopy in 3 years.

## 2014-08-18 ENCOUNTER — Other Ambulatory Visit (INDEPENDENT_AMBULATORY_CARE_PROVIDER_SITE_OTHER): Payer: Medicare Other

## 2014-08-18 ENCOUNTER — Telehealth: Payer: Self-pay | Admitting: *Deleted

## 2014-08-18 DIAGNOSIS — E876 Hypokalemia: Secondary | ICD-10-CM

## 2014-08-18 NOTE — Telephone Encounter (Signed)
Labs and dx?  

## 2014-08-18 NOTE — Telephone Encounter (Signed)
Order placed for potassium.   

## 2014-08-19 LAB — POTASSIUM: Potassium: 4 mEq/L (ref 3.5–5.1)

## 2014-09-08 DIAGNOSIS — Z961 Presence of intraocular lens: Secondary | ICD-10-CM | POA: Diagnosis not present

## 2014-09-18 ENCOUNTER — Inpatient Hospital Stay: Payer: Medicare Other | Attending: Internal Medicine

## 2014-09-18 ENCOUNTER — Inpatient Hospital Stay: Payer: Medicare Other

## 2014-09-18 DIAGNOSIS — D72828 Other elevated white blood cell count: Secondary | ICD-10-CM | POA: Insufficient documentation

## 2014-09-18 DIAGNOSIS — D45 Polycythemia vera: Secondary | ICD-10-CM | POA: Diagnosis not present

## 2014-09-18 DIAGNOSIS — D751 Secondary polycythemia: Secondary | ICD-10-CM

## 2014-09-18 LAB — HEMATOCRIT: HCT: 42.3 % (ref 35.0–47.0)

## 2014-10-23 ENCOUNTER — Ambulatory Visit: Payer: Medicare Other | Admitting: Internal Medicine

## 2014-10-23 ENCOUNTER — Other Ambulatory Visit: Payer: Medicare Other

## 2014-11-03 ENCOUNTER — Other Ambulatory Visit: Payer: Self-pay | Admitting: Internal Medicine

## 2014-11-08 ENCOUNTER — Emergency Department: Payer: Medicare Other

## 2014-11-08 ENCOUNTER — Other Ambulatory Visit: Payer: Self-pay

## 2014-11-08 ENCOUNTER — Telehealth: Payer: Self-pay | Admitting: Internal Medicine

## 2014-11-08 ENCOUNTER — Encounter: Payer: Self-pay | Admitting: Medical Oncology

## 2014-11-08 ENCOUNTER — Emergency Department
Admission: EM | Admit: 2014-11-08 | Discharge: 2014-11-08 | Disposition: A | Discharge status: Home / Self Care | Payer: Medicare Other | Attending: Student | Admitting: Student

## 2014-11-08 DIAGNOSIS — Z7952 Long term (current) use of systemic steroids: Secondary | ICD-10-CM | POA: Insufficient documentation

## 2014-11-08 DIAGNOSIS — Z79899 Other long term (current) drug therapy: Secondary | ICD-10-CM | POA: Diagnosis not present

## 2014-11-08 DIAGNOSIS — J45901 Unspecified asthma with (acute) exacerbation: Secondary | ICD-10-CM | POA: Diagnosis not present

## 2014-11-08 DIAGNOSIS — I10 Essential (primary) hypertension: Secondary | ICD-10-CM | POA: Diagnosis not present

## 2014-11-08 DIAGNOSIS — Z7951 Long term (current) use of inhaled steroids: Secondary | ICD-10-CM | POA: Insufficient documentation

## 2014-11-08 DIAGNOSIS — J441 Chronic obstructive pulmonary disease with (acute) exacerbation: Secondary | ICD-10-CM | POA: Insufficient documentation

## 2014-11-08 DIAGNOSIS — R0789 Other chest pain: Secondary | ICD-10-CM | POA: Insufficient documentation

## 2014-11-08 DIAGNOSIS — Z7982 Long term (current) use of aspirin: Secondary | ICD-10-CM | POA: Insufficient documentation

## 2014-11-08 DIAGNOSIS — Z87891 Personal history of nicotine dependence: Secondary | ICD-10-CM | POA: Diagnosis not present

## 2014-11-08 DIAGNOSIS — R0602 Shortness of breath: Secondary | ICD-10-CM | POA: Diagnosis present

## 2014-11-08 DIAGNOSIS — R05 Cough: Secondary | ICD-10-CM | POA: Diagnosis not present

## 2014-11-08 LAB — BASIC METABOLIC PANEL
Anion gap: 10 (ref 5–15)
BUN: 24 mg/dL — AB (ref 6–20)
CALCIUM: 9.8 mg/dL (ref 8.9–10.3)
CO2: 31 mmol/L (ref 22–32)
CREATININE: 1.13 mg/dL — AB (ref 0.44–1.00)
Chloride: 98 mmol/L — ABNORMAL LOW (ref 101–111)
GFR calc Af Amer: 53 mL/min — ABNORMAL LOW (ref >60)
GFR calc non Af Amer: 46 mL/min — ABNORMAL LOW (ref >60)
Glucose, Bld: 112 mg/dL — ABNORMAL HIGH (ref 65–99)
POTASSIUM: 3.1 mmol/L — AB (ref 3.5–5.1)
Sodium: 139 mmol/L (ref 135–145)

## 2014-11-08 LAB — CBC
HEMATOCRIT: 43.9 % (ref 35.0–47.0)
HEMOGLOBIN: 14.5 g/dL (ref 12.0–16.0)
MCH: 27 pg (ref 26.0–34.0)
MCHC: 33.1 g/dL (ref 32.0–36.0)
MCV: 81.4 fL (ref 80.0–100.0)
PLATELETS: 250 10*3/uL (ref 150–440)
RBC: 5.39 MIL/uL — AB (ref 3.80–5.20)
RDW: 17.5 % — AB (ref 11.5–14.5)
WBC: 7.2 10*3/uL (ref 3.6–11.0)

## 2014-11-08 LAB — TROPONIN I

## 2014-11-08 MED ORDER — METHYLPREDNISOLONE SODIUM SUCC 125 MG IJ SOLR
INTRAMUSCULAR | Status: AC
  Administered 2014-11-08: 125 mg via INTRAVENOUS
  Filled 2014-11-08: qty 2

## 2014-11-08 MED ORDER — ALBUTEROL SULFATE HFA 108 (90 BASE) MCG/ACT IN AERS: 2.0000 | INHALATION_SPRAY | Freq: Four times a day (QID) | RESPIRATORY_TRACT | Status: DC | PRN

## 2014-11-08 MED ORDER — IPRATROPIUM-ALBUTEROL 0.5-2.5 (3) MG/3ML IN SOLN: 3.0000 mL | Freq: Once | RESPIRATORY_TRACT | Status: DC

## 2014-11-08 MED ORDER — IPRATROPIUM-ALBUTEROL 0.5-2.5 (3) MG/3ML IN SOLN
RESPIRATORY_TRACT | Status: AC
  Administered 2014-11-08: 3 mL via RESPIRATORY_TRACT
  Filled 2014-11-08: qty 3

## 2014-11-08 MED ORDER — ALBUTEROL SULFATE (2.5 MG/3ML) 0.083% IN NEBU: 2.5000 mg | INHALATION_SOLUTION | Freq: Four times a day (QID) | RESPIRATORY_TRACT | Status: DC | PRN

## 2014-11-08 MED ORDER — IPRATROPIUM-ALBUTEROL 0.5-2.5 (3) MG/3ML IN SOLN
3.0000 mL | Freq: Once | RESPIRATORY_TRACT | Status: AC
  Administered 2014-11-08: 3 mL via RESPIRATORY_TRACT

## 2014-11-08 MED ORDER — ALBUTEROL SULFATE (2.5 MG/3ML) 0.083% IN NEBU
2.5000 mg | INHALATION_SOLUTION | Freq: Once | RESPIRATORY_TRACT | Status: AC
  Administered 2014-11-08: 2.5 mg via RESPIRATORY_TRACT

## 2014-11-08 MED ORDER — ALBUTEROL SULFATE (2.5 MG/3ML) 0.083% IN NEBU
INHALATION_SOLUTION | RESPIRATORY_TRACT | Status: AC
  Administered 2014-11-08: 2.5 mg via RESPIRATORY_TRACT
  Filled 2014-11-08: qty 3

## 2014-11-08 MED ORDER — PREDNISONE 20 MG PO TABS: 60.0000 mg | ORAL_TABLET | Freq: Every day | ORAL | Status: DC

## 2014-11-08 MED ORDER — METHYLPREDNISOLONE SODIUM SUCC 125 MG IJ SOLR
125.0000 mg | Freq: Once | INTRAMUSCULAR | Status: AC
  Administered 2014-11-08: 125 mg via INTRAVENOUS

## 2014-11-08 NOTE — ED Provider Notes (Signed)
[facility] Emergency Department Provider Note  ____________________________________________  Time seen: Approximately 1:40 PM  I have reviewed the triage vital signs and the nursing notes.   HISTORY  Chief Complaint Shortness of Breath and Cough    HPI Tina [name] is a 81 y.o. female with history of CAD s/p stent placement, hypertension and hypercholesterolemia ,asthma, who presents for evaluation of 5 days gradual onset shortness of breath with wheezing, constant, worse with exertion. She also has mild associated chest tightness and cough productive of whitish phlegm. She reports her air conditioning has been out at the home and she has been breathing hot air for the past several days. No fevers. Current severity is moderate. She has had nausea but no vomiting, diarrhea.   Past Medical History  Diagnosis Date  . CAD (coronary artery disease)   . Hypertension   . Hypercholesterolemia   . COPD (chronic obstructive pulmonary disease)   . Osteoporosis   . History of colon polyps   . Hyperglycemia   . Polycythemia vera(238.4)   . Renal cyst   . Asthma   . Emphysema lung     Patient Active Problem List   Diagnosis Date Noted  . Sensation of pressure in bladder area 08/15/2014  . Health care maintenance 08/15/2014  . Acute confusional state 08/10/2013  . History of colonic polyps 08/25/2012  . CAD (coronary artery disease) 05/23/2012  . COPD (chronic obstructive pulmonary disease) 05/23/2012  . Hypertension 05/23/2012  . Hypercholesterolemia 05/23/2012  . Hyperglycemia 05/23/2012  . Polycythemia vera 05/23/2012  . Renal cyst 05/23/2012  . Osteoporosis 05/23/2012    Past Surgical History  Procedure Laterality Date  . Tonsilectomy/adenoidectomy with myringotomy    . Tubal ligation    . Angioplasty      coronary (x1)  . Coronary artery bypass graft      Current Outpatient Rx  Name  Route  Sig  Dispense  Refill  . albuterol (PROVENTIL  HFA;VENTOLIN HFA) 108 (90 BASE) MCG/ACT inhaler   Inhalation   Inhale 2 puffs into the lungs every 6 (six) hours as needed.         Marland Kitchen aspirin EC 81 MG tablet   Oral   Take 1 tablet by mouth 1 day or 1 dose.         . budesonide-formoterol (SYMBICORT) 160-4.5 MCG/ACT inhaler   Inhalation   Inhale 2 puffs into the lungs 2 (two) times daily.   1 Inhaler   5   . Calcium Carbonate-Vitamin D 600-400 MG-UNIT per tablet   Oral   Take 1 tablet by mouth daily.         . fluticasone (FLONASE) 50 MCG/ACT nasal spray   Nasal   Place 2 sprays into the nose daily.   16 g   5   . metoprolol tartrate (LOPRESSOR) 25 MG tablet      Take one-half tablet by  mouth twice a day   90 tablet   1   . rosuvastatin (CRESTOR) 10 MG tablet   Oral   Take 10 mg by mouth daily.         Marland Kitchen triamterene-hydrochlorothiazide (MAXZIDE-25) 37.5-25 MG per tablet      Take 1 tablet by mouth  daily   90 tablet   1   . ZOSTAVAX 09811 UNT/0.65ML injection                 Dispense as written.     Allergies Evista and Fluticasone-salmeterol  Family History  Problem Relation Age of Onset  . Cirrhosis Father     died age 61  . Alcohol abuse Father   . Asthma Mother   . Congestive Heart Failure Mother   . Breast cancer Mother   . Lupus Sister   . Alcohol abuse Sister   . Breast cancer      2 (1/2 sisters)  . Ovarian cancer Sister     Social History History  Substance Use Topics  . Smoking status: Former Research scientist (life sciences)  . Smokeless tobacco: Never Used  . Alcohol Use: 0.0 oz/week    0 Standard drinks or equivalent per week     Comment: occasional    Review of Systems Constitutional: No fever/chills Eyes: No visual changes. ENT: No sore throat. Cardiovascular: + chest tightness Respiratory: + shortness of breath. Gastrointestinal: No abdominal pain.  No nausea, no vomiting.  No diarrhea.  No constipation. Genitourinary: Negative for dysuria. Musculoskeletal: Negative for back  pain. Skin: Negative for rash. Neurological: Negative for headaches, focal weakness or numbness.  10-point ROS otherwise negative.  ____________________________________________   PHYSICAL EXAM:  VITAL SIGNS: ED Triage Vitals  Enc Vitals Group     BP 11/08/14 1311 183/116 mmHg     Pulse Rate 11/08/14 1311 81     Resp 11/08/14 1311 21     Temp 11/08/14 1311 98 F (36.7 C)     Temp Source 11/08/14 1311 Oral     SpO2 11/08/14 1311 93 %     Weight 11/08/14 1311 142 lb (64.411 kg)     Height 11/08/14 1311 5\' 4"  (1.626 m)     Head Cir --      Peak Flow --      Pain Score --      Pain Loc --      Pain Edu? --      Excl. in [place]? --     Constitutional: Alert and oriented. Nontoxic appearing, mild respiratory distress. Eyes: Conjunctivae are normal. PERRL. EOMI. Head: Atraumatic. Nose: No congestion/rhinnorhea. Mouth/Throat: Mucous membranes are moist.  Oropharynx non-erythematous. Neck: No stridor.   Cardiovascular: Normal rate, regular rhythm. Grossly normal heart sounds.  Good peripheral circulation. Respiratory: moderately tachypnea, increased work of breathing, diffuse expiratory wheeze. Gastrointestinal: Soft and nontender. No distention. No abdominal bruits. No CVA tenderness. Genitourinary: deferred Musculoskeletal: No lower extremity tenderness nor edema.  No joint effusions. Neurologic:  Normal speech and language. No gross focal neurologic deficits are appreciated. Speech is normal. No gait instability. Skin:  Skin is warm, dry and intact. No rash noted. Psychiatric: Mood and affect are normal. Speech and behavior are normal.  ____________________________________________   LABS (all labs ordered are listed, but only abnormal results are displayed)  Labs Reviewed  BASIC METABOLIC PANEL - Abnormal; Notable for the following:    Potassium 3.1 (*)    Chloride 98 (*)    Glucose, Bld 112 (*)    BUN 24 (*)    Creatinine, Ser 1.13 (*)    GFR calc non Af Amer 46 (*)     GFR calc Af Amer 53 (*)    All other components within normal limits  CBC - Abnormal; Notable for the following:    RBC 5.39 (*)    RDW 17.5 (*)    All other components within normal limits  TROPONIN I   ____________________________________________  EKG  ED ECG REPORT I, Joanne Gavel, the attending physician, personally viewed and interpreted this ECG.   Date: 11/08/2014  EKG Time: 13:16  Rate: 69  Rhythm: normal sinus rhythm with PACs  Axis: normal  Intervals:none  ST&T Change: No acute ST segment elevation, nonspecific T-wave inversion in V2  ____________________________________________  RADIOLOGY  CXR  IMPRESSION: Similar findings of lung hyperexpansion and bronchitic change without acute cardiopulmonary disease.  ____________________________________________   PROCEDURES  Procedure(s) performed: None  Critical Care performed: No  ____________________________________________   INITIAL IMPRESSION / ASSESSMENT AND PLAN / ED COURSE  Pertinent labs & imaging results that were available during my care of the patient were reviewed by me and considered in my medical decision making (see chart for details).  Tina [name] is a 80 y.o. female with history of CAD s/p stent placement, hypertension and hypercholesterolemia ,asthma, who presents for evaluation of 5 days gradual onset shortness of breath with wheezing, constant, worse with exertion. On exam, she is in mild reference or distress, able to speak in short sentences, diffuse respiratory wheezes concerning for COPD versus asthma exacerbation.  Doubt AC, PE or acute aortic dissection. Plan for neb treatments, steroids, basic cardiac labs, chest x-ray. Reassess for disposition.  ----------------------------------------- 5:28 PM on 11/08/2014 ----------------------------------------- Creatinine very mildly elevated at 1.13, baseline is 0.96. Mild hypokalemia. Shortness of breath and chest tightness improved.  Patient has decreased wheeze with improved air movement. Ambulatory O2 saturation 91-92%. Return precautions discussed. DC with albuterol, steroids, close PCP follow-up. Return precautions discussed and she is comfortable with the discharge plan.  ____________________________________________   FINAL CLINICAL IMPRESSION(S) / ED DIAGNOSES  Final diagnoses:  Asthma attack  Asthma exacerbation      Joanne Gavel, MD 11/08/14 1730

## 2014-11-08 NOTE — Telephone Encounter (Signed)
Pt was seen recently in ER with sob and cough (seen 11/08/14).   Need to know how pt doing.  Does she need f/u with me or is she planning to f/u with her pulmonologist (Dr Raul Del)?

## 2014-11-08 NOTE — ED Notes (Signed)
Pt was ambulated around the unit. Her oxygen saturation remained between 91% to 92%. Physician notified.

## 2014-11-08 NOTE — ED Notes (Signed)
Pt to triage with reports that she began having cough/congestion and sob since Wednesday.

## 2014-11-09 NOTE — Telephone Encounter (Signed)
Pt states that she is much better after being seen in the ER for treatment for her Asthma. She has an appt with Dr. Nicki Reaper in 4 weeks & will call if she needs to be seen sooner.

## 2014-11-10 DIAGNOSIS — I10 Essential (primary) hypertension: Secondary | ICD-10-CM | POA: Diagnosis not present

## 2014-11-10 DIAGNOSIS — R002 Palpitations: Secondary | ICD-10-CM | POA: Diagnosis not present

## 2014-11-10 DIAGNOSIS — Z955 Presence of coronary angioplasty implant and graft: Secondary | ICD-10-CM | POA: Diagnosis not present

## 2014-11-11 ENCOUNTER — Inpatient Hospital Stay: Payer: Medicare Other | Attending: Family Medicine | Admitting: Family Medicine

## 2014-11-11 ENCOUNTER — Inpatient Hospital Stay: Payer: Medicare Other

## 2014-11-11 ENCOUNTER — Encounter: Payer: Self-pay | Admitting: Family Medicine

## 2014-11-11 VITALS — BP 162/88 | HR 65 | Temp 97.7°F | Resp 22 | Wt 142.9 lb

## 2014-11-11 VITALS — BP 154/82 | HR 67 | Resp 20

## 2014-11-11 DIAGNOSIS — D45 Polycythemia vera: Secondary | ICD-10-CM | POA: Insufficient documentation

## 2014-11-11 DIAGNOSIS — Z87891 Personal history of nicotine dependence: Secondary | ICD-10-CM

## 2014-11-11 DIAGNOSIS — R0602 Shortness of breath: Secondary | ICD-10-CM | POA: Insufficient documentation

## 2014-11-11 DIAGNOSIS — J439 Emphysema, unspecified: Secondary | ICD-10-CM | POA: Insufficient documentation

## 2014-11-11 DIAGNOSIS — J449 Chronic obstructive pulmonary disease, unspecified: Secondary | ICD-10-CM | POA: Insufficient documentation

## 2014-11-11 DIAGNOSIS — Z1501 Genetic susceptibility to malignant neoplasm of breast: Secondary | ICD-10-CM | POA: Insufficient documentation

## 2014-11-11 DIAGNOSIS — E780 Pure hypercholesterolemia: Secondary | ICD-10-CM | POA: Insufficient documentation

## 2014-11-11 DIAGNOSIS — Z7952 Long term (current) use of systemic steroids: Secondary | ICD-10-CM | POA: Insufficient documentation

## 2014-11-11 DIAGNOSIS — I10 Essential (primary) hypertension: Secondary | ICD-10-CM | POA: Insufficient documentation

## 2014-11-11 DIAGNOSIS — R739 Hyperglycemia, unspecified: Secondary | ICD-10-CM | POA: Insufficient documentation

## 2014-11-11 DIAGNOSIS — N281 Cyst of kidney, acquired: Secondary | ICD-10-CM | POA: Insufficient documentation

## 2014-11-11 DIAGNOSIS — Z79899 Other long term (current) drug therapy: Secondary | ICD-10-CM | POA: Insufficient documentation

## 2014-11-11 DIAGNOSIS — Z7982 Long term (current) use of aspirin: Secondary | ICD-10-CM | POA: Insufficient documentation

## 2014-11-11 DIAGNOSIS — I2510 Atherosclerotic heart disease of native coronary artery without angina pectoris: Secondary | ICD-10-CM

## 2014-11-11 DIAGNOSIS — Z803 Family history of malignant neoplasm of breast: Secondary | ICD-10-CM

## 2014-11-11 DIAGNOSIS — M818 Other osteoporosis without current pathological fracture: Secondary | ICD-10-CM

## 2014-11-11 DIAGNOSIS — Z86010 Personal history of colonic polyps: Secondary | ICD-10-CM | POA: Insufficient documentation

## 2014-11-11 DIAGNOSIS — J45909 Unspecified asthma, uncomplicated: Secondary | ICD-10-CM | POA: Insufficient documentation

## 2014-11-11 LAB — HEMATOCRIT: HCT: 43.6 % (ref 35.0–47.0)

## 2014-11-11 NOTE — Progress Notes (Signed)
Tina [name]  [phone]  [phone]     Tina [name] DOB: 05/14/1935  MR#: [id number]  XTG#:[id number]  Patient Care Team: Tina Pheasant, MD as PCP - General (Internal Medicine)  CHIEF COMPLAINT:  Chief Complaint  Patient presents with  . Follow-up    Polycythema Vera   Per previous notes from Dr. Inez Pilgrim, Patient also with significant family history for malignancy including mother with breast cancer, sister with ovarian cancer who was tested positive for BRCA mutation, niece with breast cancer, patient also positive for BRCA mutation.  INTERVAL HISTORY:  Patient is here for further evaluation and treatment consideration regarding polycythemia vera. Patient reports feeling well and denies any acute complaints. She has followed up with her primary care provider, Dr. Einar [name], and pulmonologist, Dr. Raul [name]. Patient was recently in the ER with shortness of breath and an asthma attack. She continues with some chronic shortness of breath but has recovered and is back in her normal state of health.  REVIEW OF SYSTEMS:   Review of Systems  Constitutional: Negative for fever, chills, weight loss, malaise/fatigue and diaphoresis.  HENT: Negative for congestion, ear discharge, ear pain, hearing loss, nosebleeds, sore throat and tinnitus.   Eyes: Negative for blurred vision, double vision, photophobia, pain, discharge and redness.  Respiratory: Positive for shortness of breath. Negative for cough, hemoptysis, sputum production, wheezing and stridor.   Cardiovascular: Negative for chest pain, palpitations, orthopnea, claudication, leg swelling and PND.  Gastrointestinal: Negative for heartburn, nausea, vomiting, abdominal pain, diarrhea, constipation, blood in stool and melena.  Genitourinary: Negative.   Musculoskeletal: Negative.   Skin: Negative.   Neurological: Negative for dizziness, tingling, focal weakness, seizures, weakness and headaches.    Endo/Heme/Allergies: Does not bruise/bleed easily.  Psychiatric/Behavioral: Negative for depression. The patient is not nervous/anxious and does not have insomnia.     As per HPI. Otherwise, a complete review of systems is negatve.  ONCOLOGY HISTORY:  No history exists.    PAST MEDICAL HISTORY: Past Medical History  Diagnosis Date  . CAD (coronary artery disease)   . Hypertension   . Hypercholesterolemia   . COPD (chronic obstructive pulmonary disease)   . Osteoporosis   . History of colon polyps   . Hyperglycemia   . Polycythemia vera(238.4)   . Renal cyst   . Asthma   . Emphysema lung     PAST SURGICAL HISTORY: Past Surgical History  Procedure Laterality Date  . Tonsilectomy/adenoidectomy with myringotomy    . Tubal ligation    . Angioplasty      coronary (x1)  . Coronary artery bypass graft      FAMILY HISTORY Family History  Problem Relation Age of Onset  . Cirrhosis Father     died age 71  . Alcohol abuse Father   . Asthma Mother   . Congestive Heart Failure Mother   . Breast cancer Mother   . Lupus Sister   . Alcohol abuse Sister   . Breast cancer      2 (1/2 sisters)  . Ovarian cancer Sister     GYNECOLOGIC HISTORY:  No LMP recorded. Patient is postmenopausal.     ADVANCED DIRECTIVES:    HEALTH MAINTENANCE: History  Substance Use Topics  . Smoking status: Former Smoker -- 1.00 packs/day for 45 years    Types: Cigarettes    Quit date: 11/11/2003  . Smokeless tobacco: Never Used  . Alcohol Use: 0.0 oz/week    0 Standard drinks or equivalent per week  Comment: occasional     Colonoscopy:  PAP:  Bone density:  Lipid panel:  Allergies  Allergen Reactions  . Evista [Raloxifene] Other (See Comments)    Other reaction(s): Other (See Comments) Night sweats Night sweats Night sweats  . Fluticasone-Salmeterol     Other reaction(s): Cough    Current Outpatient Prescriptions  Medication Sig Dispense Refill  . albuterol (PROAIR  HFA) 108 (90 BASE) MCG/ACT inhaler Inhale into the lungs.    Marland Kitchen albuterol (PROVENTIL HFA;VENTOLIN HFA) 108 (90 BASE) MCG/ACT inhaler Inhale 2 puffs into the lungs every 6 (six) hours as needed for wheezing or shortness of breath. 1 Inhaler 0  . albuterol (PROVENTIL) (2.5 MG/3ML) 0.083% nebulizer solution Take 3 mLs (2.5 mg total) by nebulization every 6 (six) hours as needed for wheezing or shortness of breath.    Marland Kitchen aspirin EC 81 MG tablet Take 1 tablet by mouth 1 day or 1 dose.    Marland Kitchen azithromycin (ZITHROMAX) 500 MG tablet Take 1 tablet by mouth daily.    . budesonide-formoterol (SYMBICORT) 160-4.5 MCG/ACT inhaler Inhale 2 puffs into the lungs 2 (two) times daily. 1 Inhaler 5  . Calcium Carbonate-Vitamin D 600-400 MG-UNIT per tablet Take 1 tablet by mouth daily.    . fluticasone (FLONASE) 50 MCG/ACT nasal spray Place 2 sprays into the nose daily. 16 g 5  . ipratropium-albuterol (DUONEB) 0.5-2.5 (3) MG/3ML SOLN Inhale into the lungs.    . lovastatin (MEVACOR) 40 MG tablet Take 1 tablet by mouth daily.    . metoprolol tartrate (LOPRESSOR) 25 MG tablet Take one-half tablet by  mouth twice a day 90 tablet 1  . predniSONE (DELTASONE) 20 MG tablet Take 3 tablets (60 mg total) by mouth daily. 15 tablet 0  . triamterene-hydrochlorothiazide (MAXZIDE-25) 37.5-25 MG per tablet Take 1 tablet by mouth  daily 90 tablet 1  . ZOSTAVAX 26378 UNT/0.65ML injection      No current facility-administered medications for this visit.    OBJECTIVE: BP 162/88 mmHg  Pulse 65  Temp(Src) 97.7 F (36.5 C) (Tympanic)  Resp 22  Wt 142 lb 13.7 oz (64.8 kg)  SpO2 96%   Body mass index is 24.51 kg/(m^2).    ECOG FS:0 - Asymptomatic  General: Well-developed, well-nourished, no acute distress. Eyes: Pink conjunctiva, anicteric sclera. HEENT: Normocephalic, moist mucous membranes, clear oropharnyx. Lungs: Clear to auscultation bilaterally. Heart: Regular rate and rhythm. No rubs, murmurs, or gallops. Abdomen: Soft,  nontender, nondistended. No organomegaly noted, normoactive bowel sounds. Musculoskeletal: No edema, cyanosis, or clubbing. Neuro: Alert, answering all questions appropriately. Cranial nerves grossly intact. Skin: No rashes or petechiae noted. Psych: Normal affect. Lymphatics: No cervical, calvicular, axillary or inguinal LAD.   LAB RESULTS:  Appointment on 11/11/2014  Component Date Value Ref Range Status  . HCT 11/11/2014 43.6  35.0 - 47.0 % Final    STUDIES: Dg Chest 2 View (if Patient Has Fever And/or Copd)  11/08/2014   CLINICAL DATA:  Cough and congestion for 5 days. Shortness of breath.  EXAM: CHEST  2 VIEW  COMPARISON:  10/14/2010; chest CT - 11/13/2013  FINDINGS: Grossly unchanged cardiac silhouette and mediastinal contours with atherosclerotic plaque within the thoracic aorta. Post coronary artery stent placement. The lungs remain hyperexpanded with mild diffuse slightly nodular thickening of the pulmonary interstitium. Pacer lead pads overlie the bilateral lung apices. No focal airspace opacities. No pleural effusion or pneumothorax. No edema. No acute osseous abnormalities.  IMPRESSION: Similar findings of lung hyperexpansion and bronchitic change without acute cardiopulmonary  disease.   Electronically Signed   By: Sandi Mariscal M.D.   On: 11/08/2014 14:23    ASSESSMENT:  Polycythemia vera  PLAN:   1. Polycythemia vera. As previously noted patient's H&H typically are normal. EPO level and JAK2 normal. Most recently documented target hematocrit 43-43.5. Today her hematocrit is 43.6. We will perform 250 mL phlebotomy with replacement of 250 mL self IV fluids. We'll continue with routine follow-up in 6 weeks for lab and phlebotomy only. In 12 weeks patient will have lab, phlebotomy, and provider visit.  Patient expressed understanding and was in agreement with this plan. She also understands that She can call clinic at any time with any questions, concerns, or complaints.   Dr.  Oliva Bustard was available for consultation and review of plan of care for this patient.   Evlyn Kanner, NP   11/11/2014 9:56 AM

## 2014-11-13 ENCOUNTER — Encounter: Payer: Self-pay | Admitting: Emergency Medicine

## 2014-11-13 ENCOUNTER — Emergency Department: Payer: Medicare Other

## 2014-11-13 ENCOUNTER — Other Ambulatory Visit: Payer: Self-pay

## 2014-11-13 ENCOUNTER — Emergency Department
Admission: EM | Admit: 2014-11-13 | Discharge: 2014-11-14 | Discharge status: Against Medical Advice | Payer: Medicare Other | Source: Home / Self Care | Attending: Student | Admitting: Student

## 2014-11-13 DIAGNOSIS — Z79899 Other long term (current) drug therapy: Secondary | ICD-10-CM | POA: Insufficient documentation

## 2014-11-13 DIAGNOSIS — Z888 Allergy status to other drugs, medicaments and biological substances status: Secondary | ICD-10-CM

## 2014-11-13 DIAGNOSIS — Z8041 Family history of malignant neoplasm of ovary: Secondary | ICD-10-CM

## 2014-11-13 DIAGNOSIS — D72829 Elevated white blood cell count, unspecified: Secondary | ICD-10-CM | POA: Diagnosis present

## 2014-11-13 DIAGNOSIS — R000 Tachycardia, unspecified: Secondary | ICD-10-CM | POA: Diagnosis present

## 2014-11-13 DIAGNOSIS — J441 Chronic obstructive pulmonary disease with (acute) exacerbation: Principal | ICD-10-CM | POA: Diagnosis present

## 2014-11-13 DIAGNOSIS — M810 Age-related osteoporosis without current pathological fracture: Secondary | ICD-10-CM | POA: Diagnosis present

## 2014-11-13 DIAGNOSIS — Z803 Family history of malignant neoplasm of breast: Secondary | ICD-10-CM

## 2014-11-13 DIAGNOSIS — J45901 Unspecified asthma with (acute) exacerbation: Secondary | ICD-10-CM

## 2014-11-13 DIAGNOSIS — R739 Hyperglycemia, unspecified: Secondary | ICD-10-CM | POA: Diagnosis present

## 2014-11-13 DIAGNOSIS — E780 Pure hypercholesterolemia: Secondary | ICD-10-CM | POA: Diagnosis present

## 2014-11-13 DIAGNOSIS — Z7982 Long term (current) use of aspirin: Secondary | ICD-10-CM | POA: Insufficient documentation

## 2014-11-13 DIAGNOSIS — I2510 Atherosclerotic heart disease of native coronary artery without angina pectoris: Secondary | ICD-10-CM | POA: Insufficient documentation

## 2014-11-13 DIAGNOSIS — I129 Hypertensive chronic kidney disease with stage 1 through stage 4 chronic kidney disease, or unspecified chronic kidney disease: Secondary | ICD-10-CM | POA: Diagnosis not present

## 2014-11-13 DIAGNOSIS — R0602 Shortness of breath: Secondary | ICD-10-CM | POA: Diagnosis not present

## 2014-11-13 DIAGNOSIS — T380X5A Adverse effect of glucocorticoids and synthetic analogues, initial encounter: Secondary | ICD-10-CM | POA: Diagnosis present

## 2014-11-13 DIAGNOSIS — I493 Ventricular premature depolarization: Secondary | ICD-10-CM | POA: Diagnosis present

## 2014-11-13 DIAGNOSIS — N183 Chronic kidney disease, stage 3 (moderate): Secondary | ICD-10-CM | POA: Diagnosis not present

## 2014-11-13 DIAGNOSIS — Z8249 Family history of ischemic heart disease and other diseases of the circulatory system: Secondary | ICD-10-CM

## 2014-11-13 DIAGNOSIS — I10 Essential (primary) hypertension: Secondary | ICD-10-CM

## 2014-11-13 DIAGNOSIS — Z86010 Personal history of colonic polyps: Secondary | ICD-10-CM

## 2014-11-13 DIAGNOSIS — Z7951 Long term (current) use of inhaled steroids: Secondary | ICD-10-CM | POA: Insufficient documentation

## 2014-11-13 DIAGNOSIS — J45909 Unspecified asthma, uncomplicated: Secondary | ICD-10-CM | POA: Diagnosis present

## 2014-11-13 DIAGNOSIS — Z825 Family history of asthma and other chronic lower respiratory diseases: Secondary | ICD-10-CM

## 2014-11-13 DIAGNOSIS — K219 Gastro-esophageal reflux disease without esophagitis: Secondary | ICD-10-CM | POA: Diagnosis present

## 2014-11-13 DIAGNOSIS — Z87891 Personal history of nicotine dependence: Secondary | ICD-10-CM

## 2014-11-13 DIAGNOSIS — J42 Unspecified chronic bronchitis: Secondary | ICD-10-CM | POA: Diagnosis not present

## 2014-11-13 DIAGNOSIS — Z9889 Other specified postprocedural states: Secondary | ICD-10-CM

## 2014-11-13 DIAGNOSIS — Z9851 Tubal ligation status: Secondary | ICD-10-CM

## 2014-11-13 DIAGNOSIS — Z951 Presence of aortocoronary bypass graft: Secondary | ICD-10-CM

## 2014-11-13 DIAGNOSIS — J449 Chronic obstructive pulmonary disease, unspecified: Secondary | ICD-10-CM | POA: Diagnosis not present

## 2014-11-13 DIAGNOSIS — Z811 Family history of alcohol abuse and dependence: Secondary | ICD-10-CM

## 2014-11-13 LAB — CBC WITH DIFFERENTIAL/PLATELET
BASOS PCT: 0 % (ref 0–1)
BLASTS: 0 %
Band Neutrophils: 0 % (ref 0–10)
Basophils Absolute: 0 10*3/uL (ref 0.0–0.1)
Eosinophils Absolute: 0 10*3/uL (ref 0.0–0.7)
Eosinophils Relative: 0 % (ref 0–5)
HCT: 43.7 % (ref 35.0–47.0)
HEMOGLOBIN: 14.2 g/dL (ref 12.0–16.0)
LYMPHS PCT: 5 % — AB (ref 12–46)
Lymphs Abs: 1 10*3/uL (ref 0.7–4.0)
MCH: 26.4 pg (ref 26.0–34.0)
MCHC: 32.5 g/dL (ref 32.0–36.0)
MCV: 81.5 fL (ref 80.0–100.0)
Metamyelocytes Relative: 0 %
Monocytes Absolute: 1.2 10*3/uL — ABNORMAL HIGH (ref 0.1–1.0)
Monocytes Relative: 6 % (ref 3–12)
Myelocytes: 0 %
Neutro Abs: 17.4 10*3/uL — ABNORMAL HIGH (ref 1.7–7.7)
Neutrophils Relative %: 89 % — ABNORMAL HIGH (ref 43–77)
Other: 0 %
Platelets: 345 10*3/uL (ref 150–440)
Promyelocytes Absolute: 0 %
RBC: 5.37 MIL/uL — ABNORMAL HIGH (ref 3.80–5.20)
RDW: 17.8 % — ABNORMAL HIGH (ref 11.5–14.5)
WBC: 19.6 10*3/uL — AB (ref 3.6–11.0)
nRBC: 0 /100 WBC

## 2014-11-13 LAB — COMPREHENSIVE METABOLIC PANEL
ALT: 29 U/L (ref 14–54)
ANION GAP: 15 (ref 5–15)
AST: 37 U/L (ref 15–41)
Albumin: 4 g/dL (ref 3.5–5.0)
Alkaline Phosphatase: 56 U/L (ref 38–126)
BUN: 30 mg/dL — ABNORMAL HIGH (ref 6–20)
CALCIUM: 9.2 mg/dL (ref 8.9–10.3)
CO2: 23 mmol/L (ref 22–32)
CREATININE: 1.34 mg/dL — AB (ref 0.44–1.00)
Chloride: 100 mmol/L — ABNORMAL LOW (ref 101–111)
GFR calc Af Amer: 43 mL/min — ABNORMAL LOW (ref >60)
GFR calc non Af Amer: 37 mL/min — ABNORMAL LOW (ref >60)
Glucose, Bld: 264 mg/dL — ABNORMAL HIGH (ref 65–99)
POTASSIUM: 3.7 mmol/L (ref 3.5–5.1)
Sodium: 138 mmol/L (ref 135–145)
TOTAL PROTEIN: 7 g/dL (ref 6.5–8.1)
Total Bilirubin: 0.5 mg/dL (ref 0.3–1.2)

## 2014-11-13 LAB — TROPONIN I

## 2014-11-13 MED ORDER — IPRATROPIUM-ALBUTEROL 0.5-2.5 (3) MG/3ML IN SOLN
3.0000 mL | Freq: Once | RESPIRATORY_TRACT | Status: AC
  Administered 2014-11-13: 3 mL via RESPIRATORY_TRACT

## 2014-11-13 MED ORDER — IPRATROPIUM-ALBUTEROL 0.5-2.5 (3) MG/3ML IN SOLN
RESPIRATORY_TRACT | Status: AC
  Administered 2014-11-13: 3 mL via RESPIRATORY_TRACT
  Filled 2014-11-13: qty 3

## 2014-11-13 MED ORDER — PREDNISONE 20 MG PO TABS
60.0000 mg | ORAL_TABLET | Freq: Once | ORAL | Status: AC
  Administered 2014-11-13: 60 mg via ORAL

## 2014-11-13 MED ORDER — IPRATROPIUM-ALBUTEROL 0.5-2.5 (3) MG/3ML IN SOLN
RESPIRATORY_TRACT | Status: AC
  Administered 2014-11-13: 3 mL via RESPIRATORY_TRACT
  Filled 2014-11-13: qty 6

## 2014-11-13 MED ORDER — PREDNISONE 20 MG PO TABS
ORAL_TABLET | ORAL | Status: AC
  Administered 2014-11-13: 60 mg via ORAL
  Filled 2014-11-13: qty 3

## 2014-11-13 NOTE — ED Provider Notes (Signed)
[facility] Emergency Department Provider Note  ____________________________________________  Time seen: Approximately 9:12 PM  I have reviewed the triage vital signs and the nursing notes.   HISTORY  Chief Complaint Shortness of Breath    HPI Tina [name] is a 80 y.o. female with history of CAD status post stent placement, hypertension, hypercholesterolemia, asthma/reactive airway disease who presents for evaluation of 10 days' shortness of breath with wheezing, constant, worse with exertion. The patient was seen by me on 11/08/2014 for similar symptoms, discharged with prednisone as well as albuterol nebs however she reports that she had no lasting effect/improvement in her symptoms. She has mild associated chest tightness with cough productive of whitish phlegm. No fevers. Current severity is moderate. Symptoms typically improve after albuterol nebulizer treatment.    Past Medical History  Diagnosis Date  . CAD (coronary artery disease)   . Hypertension   . Hypercholesterolemia   . COPD (chronic obstructive pulmonary disease)   . Osteoporosis   . History of colon polyps   . Hyperglycemia   . Polycythemia vera(238.4)   . Renal cyst   . Asthma   . Emphysema lung     Patient Active Problem List   Diagnosis Date Noted  . Sensation of pressure in bladder area 08/15/2014  . Health care maintenance 08/15/2014  . Acute confusional state 08/10/2013  . History of colonic polyps 08/25/2012  . CAD (coronary artery disease) 05/23/2012  . COPD (chronic obstructive pulmonary disease) 05/23/2012  . Hypertension 05/23/2012  . Hypercholesterolemia 05/23/2012  . Hyperglycemia 05/23/2012  . Polycythemia vera 05/23/2012  . Renal cyst 05/23/2012  . Osteoporosis 05/23/2012    Past Surgical History  Procedure Laterality Date  . Tonsilectomy/adenoidectomy with myringotomy    . Tubal ligation    . Angioplasty      coronary (x1)  . Coronary artery bypass graft       Current Outpatient Rx  Name  Route  Sig  Dispense  Refill  . albuterol (PROVENTIL HFA;VENTOLIN HFA) 108 (90 BASE) MCG/ACT inhaler   Inhalation   Inhale 2 puffs into the lungs every 6 (six) hours as needed for wheezing or shortness of breath.   1 Inhaler   0   . aspirin EC 81 MG tablet   Oral   Take 81 mg by mouth daily.         . budesonide-formoterol (SYMBICORT) 160-4.5 MCG/ACT inhaler   Inhalation   Inhale 2 puffs into the lungs 2 (two) times daily.   1 Inhaler   5   . Calcium Carbonate-Vitamin D (CALCIUM-D) 600-400 MG-UNIT TABS   Oral   Take 1 tablet by mouth daily.         . fluticasone (FLONASE) 50 MCG/ACT nasal spray   Nasal   Place 2 sprays into the nose daily.   16 g   5   . ipratropium-albuterol (DUONEB) 0.5-2.5 (3) MG/3ML SOLN   Nebulization   Take 3 mLs by nebulization every 6 (six) hours as needed (for shortness of breath).         . lovastatin (MEVACOR) 40 MG tablet   Oral   Take 40 mg by mouth daily.         . metoprolol tartrate (LOPRESSOR) 25 MG tablet   Oral   Take 12.5 mg by mouth 2 (two) times daily.         Marland Kitchen triamterene-hydrochlorothiazide (MAXZIDE-25) 37.5-25 MG per tablet   Oral   Take 1 tablet by mouth daily.         Marland Kitchen  metoprolol tartrate (LOPRESSOR) 25 MG tablet      Take one-half tablet by  mouth twice a day Patient not taking: Reported on 11/13/2014   90 tablet   1   . predniSONE (DELTASONE) 20 MG tablet   Oral   Take 3 tablets (60 mg total) by mouth daily. Patient not taking: Reported on 11/13/2014   15 tablet   0   . triamterene-hydrochlorothiazide (MAXZIDE-25) 37.5-25 MG per tablet      Take 1 tablet by mouth  daily Patient not taking: Reported on 11/13/2014   90 tablet   1     Allergies Evista and Fluticasone-salmeterol  Family History  Problem Relation Age of Onset  . Cirrhosis Father     died age 98  . Alcohol abuse Father   . Asthma Mother   . Congestive Heart Failure Mother   . Breast cancer  Mother   . Lupus Sister   . Alcohol abuse Sister   . Breast cancer      2 (1/2 sisters)  . Ovarian cancer Sister     Social History History  Substance Use Topics  . Smoking status: Former Smoker -- 1.00 packs/day for 45 years    Types: Cigarettes    Quit date: 11/11/2003  . Smokeless tobacco: Never Used  . Alcohol Use: 0.0 oz/week    0 Standard drinks or equivalent per week     Comment: occasional    Review of Systems Constitutional: No fever/chills Eyes: No visual changes. ENT: No sore throat. Cardiovascular:+ chest tightness Respiratory: +shortness of breath. Gastrointestinal: No abdominal pain.  No nausea, no vomiting.  No diarrhea.  No constipation. Genitourinary: Negative for dysuria. Musculoskeletal: Negative for back pain. Skin: Negative for rash. Neurological: Negative for headaches, focal weakness or numbness.  10-point ROS otherwise negative.  ____________________________________________   PHYSICAL EXAM:  VITAL SIGNS: ED Triage Vitals  Enc Vitals Group     BP 11/13/14 1638 162/87 mmHg     Pulse Rate 11/13/14 1638 92     Resp 11/13/14 1638 24     Temp 11/13/14 1638 98.3 F (36.8 C)     Temp Source 11/13/14 1638 Oral     SpO2 11/13/14 1638 94 %     Weight 11/13/14 1638 142 lb (64.411 kg)     Height 11/13/14 1638 5\' 4"  (1.626 m)     Head Cir --      Peak Flow --      Pain Score 11/13/14 1648 0     Pain Loc --      Pain Edu? --      Excl. in [place]? --     Constitutional: Alert and oriented. Well appearing and in no acute distress. Eyes: Conjunctivae are normal. PERRL. EOMI. Head: Atraumatic. Nose: No congestion/rhinnorhea. Mouth/Throat: Mucous membranes are moist.  Oropharynx non-erythematous. Neck: No stridor.   Cardiovascular: Normal rate, regular rhythm. Grossly normal heart sounds.  Good peripheral circulation. Respiratory: Moderate tachypnea, increased work of breathing, diffuse expiratory wheeze, prolonged expiratory phase. Gastrointestinal:  Soft and nontender. No distention. No abdominal bruits. No CVA tenderness. Genitourinary: deferred Musculoskeletal: No lower extremity tenderness nor edema.  No joint effusions. Neurologic:  Normal speech and language. No gross focal neurologic deficits are appreciated. Speech is normal. No gait instability. Skin:  Skin is warm, dry and intact. No rash noted. Psychiatric: Mood and affect are normal. Speech and behavior are normal.  ____________________________________________   LABS (all labs ordered are listed, but only abnormal results are displayed)  Labs Reviewed  CBC WITH DIFFERENTIAL/PLATELET - Abnormal; Notable for the following:    WBC 19.6 (*)    RBC 5.37 (*)    RDW 17.8 (*)    Neutrophils Relative % 89 (*)    Lymphocytes Relative 5 (*)    Neutro Abs 17.4 (*)    Monocytes Absolute 1.2 (*)    All other components within normal limits  COMPREHENSIVE METABOLIC PANEL - Abnormal; Notable for the following:    Chloride 100 (*)    Glucose, Bld 264 (*)    BUN 30 (*)    Creatinine, Ser 1.34 (*)    GFR calc non Af Amer 37 (*)    GFR calc Af Amer 43 (*)    All other components within normal limits  TROPONIN I   ____________________________________________  EKG  ED ECG REPORT I, Joanne Gavel, the attending physician, personally viewed and interpreted this ECG.   Date: 11/13/2014  EKG Time: 16:34  Rate: 107  Rhythm: sinus tachycardia  Axis: Normal axis  Intervals:none  ST&T Change: No acute ST segment elevation, mild ST segment depression in V3, V4, V5  ____________________________________________  RADIOLOGY  CXR  IMPRESSION: COPD/chronic bronchitis, without acute superimposed process.  Aortic atherosclerosis.  ____________________________________________   PROCEDURES  Procedure(s) performed: None  Critical Care performed: Yes, see critical care note(s). Total critical care time spent 35  minutes.  ____________________________________________   INITIAL IMPRESSION / ASSESSMENT AND PLAN / ED COURSE  Pertinent labs & imaging results that were available during my care of the patient were reviewed by me and considered in my medical decision making (see chart for details).  Tina [name] is a 80 y.o. female with history of CAD status post stent placement, hypertension, hypercholesterolemia, asthma/reactive airway disease who presents for evaluation of 10 days' shortness of breath with wheezing, constant, worse with exertion. On exam, she has mild to moderate increased work of breathing, diffuse expiratory wheeze which is minimally improves with 3 DuoNeb, prednisone. I strongly recommended admission because of this but she has failed outpatient therapy. She has refused and signed AMA paperwork. I discussed that because she becomes hypoxic with walking and becomes tachycardic, she is putting her heart at risk for damage which could lead to heart attack, heart failure, premature death. She voices understanding of this. She reports she has a loved one at home who she cares for who was unable to care for themselves and she is not able to stay for that reason. I encouraged her to return at any time for treatment and she voices understanding of this. She will be sent home with albuterol, Medrol dosepak. I also encouraged her to follow up with her primary care doctor as soon as possible. ____________________________________________   FINAL CLINICAL IMPRESSION(S) / ED DIAGNOSES  Final diagnoses:  SOB (shortness of breath)  Asthma exacerbation      Joanne Gavel, MD 11/14/14 0003

## 2014-11-13 NOTE — ED Notes (Signed)
Pt reports sob, cough & congestion continuing after the last visit to ED on Sunday. Pt reports continued use of steroid and nebulizer treatments with no lasting effect. Pt alert & oriented. NAD noted.

## 2014-11-14 ENCOUNTER — Other Ambulatory Visit: Payer: Self-pay

## 2014-11-14 ENCOUNTER — Inpatient Hospital Stay
Admission: EM | Admit: 2014-11-14 | Discharge: 2014-11-15 | DRG: 192 | Disposition: A | Discharge status: Home / Self Care | Payer: Medicare Other | Attending: Internal Medicine | Admitting: Internal Medicine

## 2014-11-14 ENCOUNTER — Encounter: Payer: Self-pay | Admitting: Internal Medicine

## 2014-11-14 DIAGNOSIS — I10 Essential (primary) hypertension: Secondary | ICD-10-CM | POA: Diagnosis present

## 2014-11-14 DIAGNOSIS — N183 Chronic kidney disease, stage 3 (moderate): Secondary | ICD-10-CM | POA: Diagnosis present

## 2014-11-14 DIAGNOSIS — Z87891 Personal history of nicotine dependence: Secondary | ICD-10-CM | POA: Diagnosis not present

## 2014-11-14 DIAGNOSIS — Z803 Family history of malignant neoplasm of breast: Secondary | ICD-10-CM | POA: Diagnosis not present

## 2014-11-14 DIAGNOSIS — E780 Pure hypercholesterolemia: Secondary | ICD-10-CM | POA: Diagnosis present

## 2014-11-14 DIAGNOSIS — Z8249 Family history of ischemic heart disease and other diseases of the circulatory system: Secondary | ICD-10-CM | POA: Diagnosis not present

## 2014-11-14 DIAGNOSIS — Z7951 Long term (current) use of inhaled steroids: Secondary | ICD-10-CM | POA: Diagnosis not present

## 2014-11-14 DIAGNOSIS — Z7982 Long term (current) use of aspirin: Secondary | ICD-10-CM | POA: Diagnosis not present

## 2014-11-14 DIAGNOSIS — I25119 Atherosclerotic heart disease of native coronary artery with unspecified angina pectoris: Secondary | ICD-10-CM | POA: Diagnosis not present

## 2014-11-14 DIAGNOSIS — Z825 Family history of asthma and other chronic lower respiratory diseases: Secondary | ICD-10-CM | POA: Diagnosis not present

## 2014-11-14 DIAGNOSIS — Z79899 Other long term (current) drug therapy: Secondary | ICD-10-CM | POA: Diagnosis not present

## 2014-11-14 DIAGNOSIS — Z8041 Family history of malignant neoplasm of ovary: Secondary | ICD-10-CM | POA: Diagnosis not present

## 2014-11-14 DIAGNOSIS — J45909 Unspecified asthma, uncomplicated: Secondary | ICD-10-CM | POA: Diagnosis present

## 2014-11-14 DIAGNOSIS — J449 Chronic obstructive pulmonary disease, unspecified: Secondary | ICD-10-CM

## 2014-11-14 DIAGNOSIS — R739 Hyperglycemia, unspecified: Secondary | ICD-10-CM | POA: Diagnosis present

## 2014-11-14 DIAGNOSIS — K219 Gastro-esophageal reflux disease without esophagitis: Secondary | ICD-10-CM | POA: Diagnosis present

## 2014-11-14 DIAGNOSIS — I493 Ventricular premature depolarization: Secondary | ICD-10-CM | POA: Diagnosis present

## 2014-11-14 DIAGNOSIS — Z951 Presence of aortocoronary bypass graft: Secondary | ICD-10-CM | POA: Diagnosis not present

## 2014-11-14 DIAGNOSIS — I2510 Atherosclerotic heart disease of native coronary artery without angina pectoris: Secondary | ICD-10-CM | POA: Diagnosis present

## 2014-11-14 DIAGNOSIS — Z9851 Tubal ligation status: Secondary | ICD-10-CM | POA: Diagnosis not present

## 2014-11-14 DIAGNOSIS — T380X5A Adverse effect of glucocorticoids and synthetic analogues, initial encounter: Secondary | ICD-10-CM | POA: Diagnosis present

## 2014-11-14 DIAGNOSIS — M810 Age-related osteoporosis without current pathological fracture: Secondary | ICD-10-CM | POA: Diagnosis present

## 2014-11-14 DIAGNOSIS — J441 Chronic obstructive pulmonary disease with (acute) exacerbation: Secondary | ICD-10-CM | POA: Diagnosis present

## 2014-11-14 DIAGNOSIS — Z86010 Personal history of colonic polyps: Secondary | ICD-10-CM | POA: Diagnosis not present

## 2014-11-14 DIAGNOSIS — Z9889 Other specified postprocedural states: Secondary | ICD-10-CM | POA: Diagnosis not present

## 2014-11-14 DIAGNOSIS — R000 Tachycardia, unspecified: Secondary | ICD-10-CM | POA: Diagnosis present

## 2014-11-14 DIAGNOSIS — J42 Unspecified chronic bronchitis: Secondary | ICD-10-CM | POA: Diagnosis not present

## 2014-11-14 DIAGNOSIS — Z888 Allergy status to other drugs, medicaments and biological substances status: Secondary | ICD-10-CM | POA: Diagnosis not present

## 2014-11-14 DIAGNOSIS — Z811 Family history of alcohol abuse and dependence: Secondary | ICD-10-CM | POA: Diagnosis not present

## 2014-11-14 DIAGNOSIS — I129 Hypertensive chronic kidney disease with stage 1 through stage 4 chronic kidney disease, or unspecified chronic kidney disease: Secondary | ICD-10-CM | POA: Diagnosis present

## 2014-11-14 DIAGNOSIS — D72829 Elevated white blood cell count, unspecified: Secondary | ICD-10-CM | POA: Diagnosis present

## 2014-11-14 HISTORY — DX: Gastro-esophageal reflux disease without esophagitis: K21.9

## 2014-11-14 HISTORY — DX: Anemia, unspecified: D64.9

## 2014-11-14 LAB — CBC WITH DIFFERENTIAL/PLATELET
Band Neutrophils: 2 %
Basophils Absolute: 0 10*3/uL (ref 0–0.1)
Basophils Relative: 0 %
Eosinophils Absolute: 0 10*3/uL (ref 0–0.7)
Eosinophils Relative: 0 %
HCT: 43.5 % (ref 35.0–47.0)
HEMOGLOBIN: 14.2 g/dL (ref 12.0–16.0)
LYMPHS PCT: 2 %
Lymphs Abs: 0.3 10*3/uL — ABNORMAL LOW (ref 1.0–3.6)
MCH: 26.7 pg (ref 26.0–34.0)
MCHC: 32.6 g/dL (ref 32.0–36.0)
MCV: 81.9 fL (ref 80.0–100.0)
MONO ABS: 0.3 10*3/uL (ref 0.2–0.9)
MONOS PCT: 2 %
Metamyelocytes Relative: 2 %
NEUTROS ABS: 16.2 10*3/uL — AB (ref 1.4–6.5)
Neutrophils Relative %: 92 %
Platelets: 337 10*3/uL (ref 150–440)
RBC: 5.32 MIL/uL — ABNORMAL HIGH (ref 3.80–5.20)
RDW: 17.6 % — ABNORMAL HIGH (ref 11.5–14.5)
WBC: 16.8 10*3/uL — ABNORMAL HIGH (ref 3.6–11.0)

## 2014-11-14 LAB — CBC
HEMATOCRIT: 43 % (ref 35.0–47.0)
HEMOGLOBIN: 13.9 g/dL (ref 12.0–16.0)
MCH: 26.3 pg (ref 26.0–34.0)
MCHC: 32.2 g/dL (ref 32.0–36.0)
MCV: 81.8 fL (ref 80.0–100.0)
PLATELETS: 315 10*3/uL (ref 150–440)
RBC: 5.26 MIL/uL — ABNORMAL HIGH (ref 3.80–5.20)
RDW: 17.3 % — ABNORMAL HIGH (ref 11.5–14.5)
WBC: 15.5 10*3/uL — AB (ref 3.6–11.0)

## 2014-11-14 LAB — BASIC METABOLIC PANEL
Anion gap: 15 (ref 5–15)
BUN: 27 mg/dL — ABNORMAL HIGH (ref 6–20)
CALCIUM: 9.2 mg/dL (ref 8.9–10.3)
CO2: 25 mmol/L (ref 22–32)
CREATININE: 1.2 mg/dL — AB (ref 0.44–1.00)
Chloride: 96 mmol/L — ABNORMAL LOW (ref 101–111)
GFR calc Af Amer: 49 mL/min — ABNORMAL LOW (ref >60)
GFR calc non Af Amer: 42 mL/min — ABNORMAL LOW (ref >60)
Glucose, Bld: 227 mg/dL — ABNORMAL HIGH (ref 65–99)
Potassium: 3.4 mmol/L — ABNORMAL LOW (ref 3.5–5.1)
Sodium: 136 mmol/L (ref 135–145)

## 2014-11-14 LAB — TROPONIN I: Troponin I: 0.04 ng/mL — ABNORMAL HIGH (ref <0.031)

## 2014-11-14 LAB — CREATININE, SERUM
Creatinine, Ser: 1.08 mg/dL — ABNORMAL HIGH (ref 0.44–1.00)
GFR calc non Af Amer: 48 mL/min — ABNORMAL LOW (ref >60)
GFR, EST AFRICAN AMERICAN: 56 mL/min — AB (ref >60)

## 2014-11-14 LAB — HEMOGLOBIN A1C: HEMOGLOBIN A1C: 6 % (ref 4.0–6.0)

## 2014-11-14 MED ORDER — IPRATROPIUM-ALBUTEROL 0.5-2.5 (3) MG/3ML IN SOLN: 3.0000 mL | Freq: Four times a day (QID) | RESPIRATORY_TRACT | Status: AC | PRN

## 2014-11-14 MED ORDER — IPRATROPIUM-ALBUTEROL 0.5-2.5 (3) MG/3ML IN SOLN
3.0000 mL | RESPIRATORY_TRACT | Status: DC
  Administered 2014-11-14 – 2014-11-15 (×5): 3 mL via RESPIRATORY_TRACT
  Filled 2014-11-14 (×6): qty 3

## 2014-11-14 MED ORDER — PRAVASTATIN SODIUM 20 MG PO TABS
40.0000 mg | ORAL_TABLET | Freq: Every day | ORAL | Status: DC
  Administered 2014-11-14: 40 mg via ORAL
  Filled 2014-11-14: qty 2

## 2014-11-14 MED ORDER — HEPARIN SODIUM (PORCINE) 5000 UNIT/ML IJ SOLN
5000.0000 [IU] | Freq: Three times a day (TID) | INTRAMUSCULAR | Status: DC
  Administered 2014-11-14 – 2014-11-15 (×3): 5000 [IU] via SUBCUTANEOUS
  Filled 2014-11-14 (×4): qty 1

## 2014-11-14 MED ORDER — ALBUTEROL SULFATE (2.5 MG/3ML) 0.083% IN NEBU: 2.5000 mg | INHALATION_SOLUTION | Freq: Four times a day (QID) | RESPIRATORY_TRACT | Status: DC | PRN

## 2014-11-14 MED ORDER — IPRATROPIUM-ALBUTEROL 0.5-2.5 (3) MG/3ML IN SOLN
RESPIRATORY_TRACT | Status: AC
  Filled 2014-11-14: qty 9

## 2014-11-14 MED ORDER — MAGNESIUM HYDROXIDE 400 MG/5ML PO SUSP
30.0000 mL | Freq: Every day | ORAL | Status: DC | PRN
Start: 2014-11-14 — End: 2014-11-15

## 2014-11-14 MED ORDER — METHYLPREDNISOLONE 4 MG PO TBPK: ORAL_TABLET | ORAL | Status: DC

## 2014-11-14 MED ORDER — METOPROLOL TARTRATE 25 MG PO TABS
12.5000 mg | ORAL_TABLET | Freq: Two times a day (BID) | ORAL | Status: DC
  Administered 2014-11-14 – 2014-11-15 (×3): 12.5 mg via ORAL
  Filled 2014-11-14 (×3): qty 1

## 2014-11-14 MED ORDER — ACETAMINOPHEN 325 MG PO TABS: 650.0000 mg | ORAL_TABLET | Freq: Four times a day (QID) | ORAL | Status: DC | PRN

## 2014-11-14 MED ORDER — ASPIRIN EC 81 MG PO TBEC
81.0000 mg | DELAYED_RELEASE_TABLET | Freq: Every day | ORAL | Status: DC
  Administered 2014-11-14 – 2014-11-15 (×2): 81 mg via ORAL
  Filled 2014-11-14 (×2): qty 1

## 2014-11-14 MED ORDER — METHYLPREDNISOLONE SODIUM SUCC 125 MG IJ SOLR
60.0000 mg | Freq: Four times a day (QID) | INTRAMUSCULAR | Status: DC
  Administered 2014-11-14 – 2014-11-15 (×5): 60 mg via INTRAVENOUS
  Filled 2014-11-14 (×5): qty 2

## 2014-11-14 MED ORDER — IPRATROPIUM-ALBUTEROL 0.5-2.5 (3) MG/3ML IN SOLN
9.0000 mL | Freq: Once | RESPIRATORY_TRACT | Status: AC
  Administered 2014-11-14: 9 mL via RESPIRATORY_TRACT

## 2014-11-14 MED ORDER — FLUTICASONE PROPIONATE 50 MCG/ACT NA SUSP
2.0000 | Freq: Every day | NASAL | Status: DC
  Administered 2014-11-14 – 2014-11-15 (×2): 2 via NASAL
  Filled 2014-11-14: qty 16

## 2014-11-14 MED ORDER — ACETAMINOPHEN 650 MG RE SUPP: 650.0000 mg | Freq: Four times a day (QID) | RECTAL | Status: DC | PRN

## 2014-11-14 MED ORDER — ALBUTEROL SULFATE HFA 108 (90 BASE) MCG/ACT IN AERS: 2.0000 | INHALATION_SPRAY | Freq: Four times a day (QID) | RESPIRATORY_TRACT | Status: DC | PRN

## 2014-11-14 MED ORDER — TRIAMTERENE-HCTZ 37.5-25 MG PO TABS
1.0000 | ORAL_TABLET | Freq: Every day | ORAL | Status: DC
  Administered 2014-11-14 – 2014-11-15 (×2): 1 via ORAL
  Filled 2014-11-14 (×2): qty 1

## 2014-11-14 MED ORDER — METHYLPREDNISOLONE SODIUM SUCC 125 MG IJ SOLR
INTRAMUSCULAR | Status: AC
  Filled 2014-11-14: qty 2

## 2014-11-14 MED ORDER — METHYLPREDNISOLONE SODIUM SUCC 125 MG IJ SOLR
125.0000 mg | Freq: Once | INTRAMUSCULAR | Status: AC
  Administered 2014-11-14: 125 mg via INTRAVENOUS

## 2014-11-14 NOTE — Progress Notes (Signed)
Elevated troponin of 0.04. MD aware. No interventions at this time.

## 2014-11-14 NOTE — ED Notes (Signed)
Pt. States she was here yesterday for the same symptoms.  Pt. States she had to leave early because of a family emergency.  Pt. Was told to return when able.  Pt. Has expiratory wheezing upon excursion.  Pt. States similar symptoms two years ago.  Pt. Has light swelling to lower extremities.

## 2014-11-14 NOTE — Discharge Instructions (Signed)
Asthma Asthma is a recurring condition in which the airways tighten and narrow. Asthma can make it difficult to breathe. It can cause coughing, wheezing, and shortness of breath. Asthma episodes, also called asthma attacks, range from minor to life-threatening. Asthma cannot be cured, but medicines and lifestyle changes can help control it. CAUSES Asthma is believed to be caused by inherited (genetic) and environmental factors, but its exact cause is unknown. Asthma may be triggered by allergens, lung infections, or irritants in the air. Asthma triggers are different for each person. Common triggers include:   Animal dander.  Dust mites.  Cockroaches.  Pollen from trees or grass.  Mold.  Smoke.  Air pollutants such as dust, household cleaners, hair sprays, aerosol sprays, paint fumes, strong chemicals, or strong odors.  Cold air, weather changes, and winds (which increase molds and pollens in the air).  Strong emotional expressions such as crying or laughing hard.  Stress.  Certain medicines (such as aspirin) or types of drugs (such as beta-blockers).  Sulfites in foods and drinks. Foods and drinks that may contain sulfites include dried fruit, potato chips, and sparkling grape juice.  Infections or inflammatory conditions such as the flu, a cold, or an inflammation of the nasal membranes (rhinitis).  Gastroesophageal reflux disease (GERD).  Exercise or strenuous activity. SYMPTOMS Symptoms may occur immediately after asthma is triggered or many hours later. Symptoms include:  Wheezing.  Excessive nighttime or early morning coughing.  Frequent or severe coughing with a common cold.  Chest tightness.  Shortness of breath. DIAGNOSIS  The diagnosis of asthma is made by a review of your medical history and a physical exam. Tests may also be performed. These may include:  Lung function studies. These tests show how much air you breathe in and out.  Allergy  tests.  Imaging tests such as X-rays. TREATMENT  Asthma cannot be cured, but it can usually be controlled. Treatment involves identifying and avoiding your asthma triggers. It also involves medicines. There are 2 classes of medicine used for asthma treatment:   Controller medicines. These prevent asthma symptoms from occurring. They are usually taken every day.  Reliever or rescue medicines. These quickly relieve asthma symptoms. They are used as needed and provide short-term relief. Your health care provider will help you create an asthma action plan. An asthma action plan is a written plan for managing and treating your asthma attacks. It includes a list of your asthma triggers and how they may be avoided. It also includes information on when medicines should be taken and when their dosage should be changed. An action plan may also involve the use of a device called a peak flow meter. A peak flow meter measures how well the lungs are working. It helps you monitor your condition. HOME CARE INSTRUCTIONS   Take medicines only as directed by your health care provider. Speak with your health care provider if you have questions about how or when to take the medicines.  Use a peak flow meter as directed by your health care provider. Record and keep track of readings.  Understand and use the action plan to help minimize or stop an asthma attack without needing to seek medical care.  Control your home environment in the following ways to help prevent asthma attacks:  Do not smoke. Avoid being exposed to secondhand smoke.  Change your heating and air conditioning filter regularly.  Limit your use of fireplaces and wood stoves.  Get rid of pests (such as roaches and  mice) and their droppings.  Throw away plants if you see mold on them.  Clean your floors and dust regularly. Use unscented cleaning products.  Try to have someone else vacuum for you regularly. Stay out of rooms while they are  being vacuumed and for a short while afterward. If you vacuum, use a dust mask from a hardware store, a double-layered or microfilter vacuum cleaner bag, or a vacuum cleaner with a HEPA filter.  Replace carpet with wood, tile, or vinyl flooring. Carpet can trap dander and dust.  Use allergy-proof pillows, mattress covers, and box spring covers.  Wash bed sheets and blankets every week in hot water and dry them in a dryer.  Use blankets that are made of polyester or cotton.  Clean bathrooms and kitchens with bleach. If possible, have someone repaint the walls in these rooms with mold-resistant paint. Keep out of the rooms that are being cleaned and painted.  Wash hands frequently. SEEK MEDICAL CARE IF:   You have wheezing, shortness of breath, or a cough even if taking medicine to prevent attacks.  The colored mucus you cough up (sputum) is thicker than usual.  Your sputum changes from clear or white to yellow, green, gray, or bloody.  You have any problems that may be related to the medicines you are taking (such as a rash, itching, swelling, or trouble breathing).  You are using a reliever medicine more than 2-3 times per week.  Your peak flow is still at 50-79% of your personal best after following your action plan for 1 hour.  You have a fever. SEEK IMMEDIATE MEDICAL CARE IF:   You seem to be getting worse and are unresponsive to treatment during an asthma attack.  You are short of breath even at rest.  You get short of breath when doing very little physical activity.  You have difficulty eating, drinking, or talking due to asthma symptoms.  You develop chest pain.  You develop a fast heartbeat.  You have a bluish color to your lips or fingernails.  You are light-headed, dizzy, or faint.  Your peak flow is less than 50% of your personal best. MAKE SURE YOU:   Understand these instructions.  Will watch your condition.  Will get help right away if you are not  doing well or get worse. Document Released: 05/01/2005 Document Revised: 09/15/2013 Document Reviewed: 11/28/2012 [facility] Patient Information 2015 [place], [place]. This information is not intended to replace advice given to you by your health care provider. Make sure you discuss any questions you have with your health care provider.  Acute Respiratory Failure Respiratory failure is when your lungs are not working well and your breathing (respiratory) system fails. When respiratory failure occurs, it is difficult for your lungs to get enough oxygen, get rid of carbon dioxide, or both. Respiratory failure can be life threatening.  Respiratory failure can be acute or chronic. Acute respiratory failure is sudden, severe, and requires emergency medical treatment. Chronic respiratory failure is less severe, happens over time, and requires ongoing treatment.  WHAT ARE THE CAUSES OF ACUTE RESPIRATORY FAILURE?  Any problem affecting the heart or lungs can cause acute respiratory failure. Some of these causes include the following:  Chronic bronchitis and emphysema (COPD).   Blood clot going to a lung (pulmonary embolism).   Having water in the lungs caused by heart failure, lung injury, or infection (pulmonary edema).   Collapsed lung (pneumothorax).   Pneumonia.   Pulmonary fibrosis.   Obesity.  Asthma.   Heart failure.   Any type of trauma to the chest that can make breathing difficult.   Nerve or muscle diseases making chest movements difficult. WHAT SYMPTOMS SHOULD YOU WATCH FOR?  If you have any of these signs or symptoms, you should seek immediate medical care:   You have shortness of breath (dyspnea) with or without activity.   You have rapid, fast breathing (tachypnea).   You are wheezing.  You are unable to say more than a few words without having to catch your breath.  You find it very difficult to function normally.  You have a fast heart rate.   You  have a bluish color to your finger or toe nail beds.   You have confusion or drowsiness or both.  HOW WILL MY ACUTE RESPIRATORY FAILURE BE TREATED?  Treatment of acute respiratory failure depends on the cause of the respiratory failure. Usually, you will stay in the intensive care unit so your breathing can be watched closely. Treatment can include the following:  Oxygen. Oxygen can be delivered through the following:  Nasal cannula. This is small tubing that goes in your nose to give you oxygen.  Face mask. A face mask covers your nose and mouth to give you oxygen.  Medicine. Different medicines can be given to help with breathing. These can include:  Nebulizers. Nebulizers deliver medicines to open the air passages (bronchodilators). These medicines help to open or relax the airways in the lungs so you can breathe better. They can also help loosen mucus from your lungs.  Diuretics. Diuretic medicines can help you breathe better by getting rid of extra water in your body.  Steroids. Steroid medicines can help decrease swelling (inflammation) in your lungs.  Antibiotics.  Chest tube. If you have a collapsed lung (pneumothorax), a chest tube is placed to help reinflate the lung.  Non-invasive positive pressure ventilation (NPPV). This is a tight-fitting mask that goes over your nose and mouth. The mask has tubing that is attached to a machine. The machine blows air into the tubing, which helps to keep the tiny air sacs (alveoli) in your lungs open. This machine allows you to breathe on your own.  Ventilator. A ventilator is a breathing machine. When on a ventilator, a breathing tube is put into the lungs. A ventilator is used when you can no longer breathe well enough on your own. You may have low oxygen levels or high carbon dioxide (CO2) levels in your blood. When you are on a ventilator, sedation and pain medicines are given to make you sleep so your lungs can heal. Document Released:  05/06/2013 Document Revised: 09/15/2013 Document Reviewed: 05/06/2013 [facility] Patient Information 2015 [place], [place]. This information is not intended to replace advice given to you by your health care provider. Make sure you discuss any questions you have with your health care provider.

## 2014-11-14 NOTE — ED Provider Notes (Signed)
[facility] Emergency Department Provider Note  ____________________________________________  Time seen: Approximately 7:15 AM  I have reviewed the triage vital signs and the nursing notes.   HISTORY  Chief Complaint Shortness of Breath    HPI Tina [name] is a 80 y.o. female with a history of COPD who presents today for shortness of breath. The patient says that over the past month she has had worsening dyspnea on exertion. It is gotten to the point where she can't even sleep because she is having so much trouble breathing. Today's visit is her third visit to the emergency department. She was here one week ago and was given a outpatient steroid course. When this did not improve her symptoms returned yesterday and was supposed to be admitted but needed to leave to attend to a relative who is sick at home. She is back today and understands the need for admission if necessary. She is willing to comply. Denies any chest pain. Has a cough. No fevers. No known sick contacts.   Past Medical History  Diagnosis Date  . CAD (coronary artery disease)   . Hypertension   . Hypercholesterolemia   . COPD (chronic obstructive pulmonary disease)   . Osteoporosis   . History of colon polyps   . Hyperglycemia   . Polycythemia vera(238.4)   . Renal cyst   . Asthma   . Emphysema lung     Patient Active Problem List   Diagnosis Date Noted  . Sensation of pressure in bladder area 08/15/2014  . Health care maintenance 08/15/2014  . Acute confusional state 08/10/2013  . History of colonic polyps 08/25/2012  . CAD (coronary artery disease) 05/23/2012  . COPD (chronic obstructive pulmonary disease) 05/23/2012  . Hypertension 05/23/2012  . Hypercholesterolemia 05/23/2012  . Hyperglycemia 05/23/2012  . Polycythemia vera 05/23/2012  . Renal cyst 05/23/2012  . Osteoporosis 05/23/2012    Past Surgical History  Procedure Laterality Date  . Tonsilectomy/adenoidectomy with  myringotomy    . Tubal ligation    . Angioplasty      coronary (x1)  . Coronary artery bypass graft      Current Outpatient Rx  Name  Route  Sig  Dispense  Refill  . albuterol (PROVENTIL HFA;VENTOLIN HFA) 108 (90 BASE) MCG/ACT inhaler   Inhalation   Inhale 2 puffs into the lungs every 6 (six) hours as needed for wheezing or shortness of breath.   1 Inhaler   0   . aspirin EC 81 MG tablet   Oral   Take 81 mg by mouth daily.         . budesonide-formoterol (SYMBICORT) 160-4.5 MCG/ACT inhaler   Inhalation   Inhale 2 puffs into the lungs 2 (two) times daily.   1 Inhaler   5   . Calcium Carbonate-Vitamin D (CALCIUM-D) 600-400 MG-UNIT TABS   Oral   Take 1 tablet by mouth daily.         . fluticasone (FLONASE) 50 MCG/ACT nasal spray   Nasal   Place 2 sprays into the nose daily.   16 g   5   . ipratropium-albuterol (DUONEB) 0.5-2.5 (3) MG/3ML SOLN   Nebulization   Take 3 mLs by nebulization every 6 (six) hours as needed (for shortness of breath).   36 mL   0   . lovastatin (MEVACOR) 40 MG tablet   Oral   Take 40 mg by mouth daily.         . methylPREDNISolone (MEDROL DOSEPAK) 4  MG TBPK tablet      Take according to package instructions.   1 tablet   0     Dispense as written.   . metoprolol tartrate (LOPRESSOR) 25 MG tablet      Take one-half tablet by  mouth twice a day Patient not taking: Reported on 11/13/2014   90 tablet   1   . metoprolol tartrate (LOPRESSOR) 25 MG tablet   Oral   Take 12.5 mg by mouth 2 (two) times daily.         . predniSONE (DELTASONE) 20 MG tablet   Oral   Take 3 tablets (60 mg total) by mouth daily. Patient not taking: Reported on 11/13/2014   15 tablet   0   . triamterene-hydrochlorothiazide (MAXZIDE-25) 37.5-25 MG per tablet      Take 1 tablet by mouth  daily Patient not taking: Reported on 11/13/2014   90 tablet   1   . triamterene-hydrochlorothiazide (MAXZIDE-25) 37.5-25 MG per tablet   Oral   Take 1 tablet by  mouth daily.           Allergies Evista and Fluticasone-salmeterol  Family History  Problem Relation Age of Onset  . Cirrhosis Father     died age 56  . Alcohol abuse Father   . Asthma Mother   . Congestive Heart Failure Mother   . Breast cancer Mother   . Lupus Sister   . Alcohol abuse Sister   . Breast cancer      2 (1/2 sisters)  . Ovarian cancer Sister     Social History History  Substance Use Topics  . Smoking status: Former Smoker -- 1.00 packs/day for 45 years    Types: Cigarettes    Quit date: 11/11/2003  . Smokeless tobacco: Never Used  . Alcohol Use: 0.0 oz/week    0 Standard drinks or equivalent per week     Comment: occasional    Review of Systems Constitutional: No fever/chills Eyes: No visual changes. ENT: No sore throat. Cardiovascular: Denies chest pain. Respiratory: As above  Gastrointestinal: No abdominal pain.  No nausea, no vomiting.  No diarrhea.  No constipation. Genitourinary: Negative for dysuria. Musculoskeletal: Negative for back pain. Skin: Negative for rash. Neurological: Negative for headaches, focal weakness or numbness.  10-point ROS otherwise negative.  ____________________________________________   PHYSICAL EXAM:  VITAL SIGNS: ED Triage Vitals  Enc Vitals Group     BP 11/14/14 0330 195/78 mmHg     Pulse Rate 11/14/14 0330 91     Resp 11/14/14 0330 20     Temp 11/14/14 0330 98.2 F (36.8 C)     Temp src --      SpO2 11/14/14 0330 93 %     Weight 11/14/14 0330 142 lb (64.411 kg)     Height 11/14/14 0330 5\' 5"  (1.651 m)     Head Cir --      Peak Flow --      Pain Score 11/14/14 0627 0     Pain Loc --      Pain Edu? --      Excl. in [place]? --     Constitutional: Alert and oriented. Well appearing and in no acute distress. Eyes: Conjunctivae are normal. PERRL. EOMI. Head: Atraumatic. Nose: No congestion/rhinnorhea. Mouth/Throat: Mucous membranes are moist.  Oropharynx non-erythematous. Neck: No stridor.    Cardiovascular: Normal rate, regular rhythm. Grossly normal heart sounds.  Good peripheral circulation. Respiratory: Tachypneic. Wheezing throughout with prolonged expiratory phase. Decreased air  movement. No retractions. Speaking in full sentences. When walks heart rate goes up to 140. Becomes acutely short of breath with increased dyspnea and work of breathing. Gastrointestinal: Soft and nontender. No distention. No abdominal bruits. No CVA tenderness. Musculoskeletal: Mild edema bilaterally and equal to the lower extremities.  No joint effusions. Neurologic:  Normal speech and language. No gross focal neurologic deficits are appreciated. Speech is normal. No gait instability. Skin:  Skin is warm, dry and intact. No rash noted. Psychiatric: Mood and affect are normal. Speech and behavior are normal.  ____________________________________________   LABS (all labs ordered are listed, but only abnormal results are displayed)  Labs Reviewed  CBC WITH DIFFERENTIAL/PLATELET  BASIC METABOLIC PANEL   ____________________________________________  EKG  ED ECG REPORT I, Schaevitz,  Youlanda Roys, the attending physician, personally viewed and interpreted this ECG.   Date: 11/14/2014  EKG Time: 338  Rate: 76  Rhythm: Wandering atrial pacemaker  Axis: Normal axis  Intervals:none  ST&T Change: No ST elevations or depressions. No abnormal T-wave inversions.  ____________________________________________  RADIOLOGY  X-ray consistent with COPD. I personally reviewed the images. ____________________________________________   PROCEDURES    ____________________________________________   INITIAL IMPRESSION / ASSESSMENT AND PLAN / ED COURSE  Pertinent labs & imaging results that were available during my care of the patient were reviewed by me and considered in my medical decision making (see chart for details).  ----------------------------------------- 7:31 AM on  11/14/2014 -----------------------------------------   To admit the patient. To give another round of nebs and steroids. Signed out to Dr. Volanda Napoleon. ____________________________________________   FINAL CLINICAL IMPRESSION(S) / ED DIAGNOSES  Acute COPD exacerbation. Return visit.    Orbie Pyo, MD 11/14/14 [id number]

## 2014-11-14 NOTE — H&P (Addendum)
[place] [facility] [place] NAME: Tina [name]    MR#:  [id number]  DATE OF BIRTH:  1934/08/30  DATE OF ADMISSION:  11/14/2014  PRIMARY CARE PHYSICIAN: Einar Pheasant, MD   REQUESTING/REFERRING PHYSICIAN: Dr Clearnce Hasten  CHIEF COMPLAINT:   Chief Complaint  Patient presents with  . Shortness of Breath    HISTORY OF PRESENT ILLNESS:  Marceille Manly  is a 80 y.o. female with a known history of COPD, coronary artery disease status post PCI in 2003, hypertension presents with 4 weeks of gradually worsening shortness of breath. She reports that she has these types of symptoms annually due to environmental allergens, they usually resolve with a prednisone taper from her primary care physician. This time symptoms have been getting progressively worse. She describes wheezing, coughing, production of clear sputum, sensation of chest tightness and inability to take a deep breath. For the past week she has been very short of breath with any exertion. She's been using her albuterol inhaler several times a day. She denies fevers or chills, nausea vomiting. She does not have overt chest pain but the chest feels very tight. Sputum is clear, no hemoptysis. She quit smoking about 10 years ago prior to that she was a one pack per day smoker for 45 years.   She actually presented to the emergency room last night and admission was advised but she declined. She went home to take care of her sister who is currently bedbound due to a back injury. She returned once she realized she was so short of breath she could not assist her sister at all. In the emergency room she has dyspnea with exertion with decreasing oxygen saturation to the 80%'s and tachycardia on ambulation. She is being admitted for COPD exacerbation.  PAST MEDICAL HISTORY:   Past Medical History  Diagnosis Date  . CAD (coronary artery disease)   . Hypertension   . Hypercholesterolemia   . COPD (chronic  obstructive pulmonary disease)   . Osteoporosis   . History of colon polyps   . Hyperglycemia   . Polycythemia vera(238.4)   . Renal cyst   . Asthma   . Emphysema lung   . GERD (gastroesophageal reflux disease)   . Anemia     PAST SURGICAL HISTORY:   Past Surgical History  Procedure Laterality Date  . Tonsilectomy/adenoidectomy with myringotomy    . Tubal ligation    . Angioplasty      coronary (x1)  . Coronary artery bypass graft    . Coronary angioplasty    . Colon surgery      SOCIAL HISTORY:   History  Substance Use Topics  . Smoking status: Former Smoker -- 1.00 packs/day for 45 years    Types: Cigarettes    Quit date: 11/11/2003  . Smokeless tobacco: Never Used  . Alcohol Use: 0.0 oz/week    0 Standard drinks or equivalent per week     Comment: occasional    FAMILY HISTORY:   Family History  Problem Relation Age of Onset  . Cirrhosis Father     died age 49  . Alcohol abuse Father   . Asthma Mother   . Congestive Heart Failure Mother   . Breast cancer Mother   . Lupus Sister   . Alcohol abuse Sister   . Breast cancer      2 (1/2 sisters)  . Ovarian cancer Sister     DRUG ALLERGIES:   Allergies  Allergen Reactions  .  Evista [Raloxifene] Other (See Comments)    Reaction:  Night sweats   . Fluticasone-Salmeterol Other (See Comments)    Reaction:  Cough     REVIEW OF SYSTEMS:   Review of Systems  Constitutional: Negative for fever, chills, weight loss and malaise/fatigue.  HENT: Negative for congestion, hearing loss and sore throat.   Eyes: Negative for blurred vision and pain.  Respiratory: Positive for cough, sputum production, shortness of breath and wheezing. Negative for hemoptysis and stridor.   Cardiovascular: Positive for palpitations and leg swelling. Negative for chest pain and orthopnea.  Gastrointestinal: Negative for nausea, vomiting, abdominal pain, diarrhea, constipation and blood in stool.  Genitourinary: Negative for dysuria  and frequency.  Musculoskeletal: Negative for myalgias, back pain, joint pain and neck pain.  Skin: Negative for rash.  Neurological: Negative for dizziness, sensory change, focal weakness, loss of consciousness and headaches.  Endo/Heme/Allergies: Does not bruise/bleed easily.  Psychiatric/Behavioral: Negative for depression and hallucinations. The patient is not nervous/anxious.     MEDICATIONS AT HOME:   Prior to Admission medications   Medication Sig Start Date End Date Taking? Authorizing Provider  albuterol (PROVENTIL HFA;VENTOLIN HFA) 108 (90 BASE) MCG/ACT inhaler Inhale 2 puffs into the lungs every 6 (six) hours as needed for wheezing or shortness of breath. 11/08/14   Joanne Gavel, MD  aspirin EC 81 MG tablet Take 81 mg by mouth daily.    Historical Provider, MD  budesonide-formoterol (SYMBICORT) 160-4.5 MCG/ACT inhaler Inhale 2 puffs into the lungs 2 (two) times daily. 03/30/14   Einar Pheasant, MD  Calcium Carbonate-Vitamin D (CALCIUM-D) 600-400 MG-UNIT TABS Take 1 tablet by mouth daily.    Historical Provider, MD  fluticasone (FLONASE) 50 MCG/ACT nasal spray Place 2 sprays into the nose daily. 10/10/12   Einar Pheasant, MD  ipratropium-albuterol (DUONEB) 0.5-2.5 (3) MG/3ML SOLN Take 3 mLs by nebulization every 6 (six) hours as needed (for shortness of breath). 11/14/14   Joanne Gavel, MD  lovastatin (MEVACOR) 40 MG tablet Take 40 mg by mouth daily.    Historical Provider, MD  methylPREDNISolone (MEDROL DOSEPAK) 4 MG TBPK tablet Take according to package instructions. 11/14/14   Joanne Gavel, MD  metoprolol tartrate (LOPRESSOR) 25 MG tablet Take one-half tablet by  mouth twice a day Patient not taking: Reported on 11/13/2014 11/03/14   Einar Pheasant, MD  metoprolol tartrate (LOPRESSOR) 25 MG tablet Take 12.5 mg by mouth 2 (two) times daily.    Historical Provider, MD  predniSONE (DELTASONE) 20 MG tablet Take 3 tablets (60 mg total) by mouth daily. Patient not taking: Reported on  11/13/2014 11/08/14   Joanne Gavel, MD  triamterene-hydrochlorothiazide (MAXZIDE-25) 37.5-25 MG per tablet Take 1 tablet by mouth  daily Patient not taking: Reported on 11/13/2014 11/03/14   Einar Pheasant, MD  triamterene-hydrochlorothiazide (MAXZIDE-25) 37.5-25 MG per tablet Take 1 tablet by mouth daily.    Historical Provider, MD      VITAL SIGNS:  Blood pressure 180/79, pulse 76, temperature 98.1 F (36.7 C), resp. rate 15, height 5\' 5"  (1.651 m), weight 64.411 kg (142 lb), SpO2 96 %.  PHYSICAL EXAMINATION:  GENERAL:  80 y.o.-year-old patient lying in the bed with no acute distress.  EYES: Pupils equal, round, reactive to light and accommodation. No scleral icterus. Extraocular muscles intact.  HEENT: Head atraumatic, normocephalic. Oropharynx and nasopharynx clear. Mucous membranes dry. Tragus. NECK:  Supple, no jugular venous distention. No thyroid enlargement, no tenderness.  LUNGS: No respiratory distress at rest, speaking in  full sentences, loud expiratory wheezes, fair air movement, no rhonchi or rales CARDIOVASCULAR: S1, S2 normal. No murmurs, rubs, or gallops. Irregular ABDOMEN: Soft, nontender, nondistended. Bowel sounds present. No organomegaly or mass.  EXTREMITIES: +1 pedal edema laterally, cyanosis, or clubbing.  NEUROLOGIC: Cranial nerves II through XII are intact. Muscle strength 5/5 in all extremities. Sensation intact. Gait not checked.  PSYCHIATRIC: The patient is alert and oriented x 3.  SKIN: No obvious rash, lesion, or ulcer.   LABORATORY PANEL:   CBC  Recent Labs Lab 11/14/14 0338  WBC 16.8*  HGB 14.2  HCT 43.5  PLT 337   ------------------------------------------------------------------------------------------------------------------  Chemistries   Recent Labs Lab 11/13/14 1637 11/14/14 0338  NA 138 136  K 3.7 3.4*  CL 100* 96*  CO2 23 25  GLUCOSE 264* 227*  BUN 30* 27*  CREATININE 1.34* 1.20*  CALCIUM 9.2 9.2  AST 37  --   ALT 29  --    ALKPHOS 56  --   BILITOT 0.5  --    ------------------------------------------------------------------------------------------------------------------  Cardiac Enzymes  Recent Labs Lab 11/13/14 1637  TROPONINI <0.03   ------------------------------------------------------------------------------------------------------------------  RADIOLOGY:  Dg Chest 2 View  11/13/2014   CLINICAL DATA:  Shortness of breath. Chest congestion for 3 weeks. COPD. Ex-smoker.  EXAM: CHEST  2 VIEW  COMPARISON:  11/08/2014  FINDINGS: Mild hyperinflation. Lateral view degraded by patient arm position. Midline trachea. Normal heart size. Atherosclerosis in the transverse aorta. No pleural effusion or pneumothorax. Diffuse peribronchial thickening. Scarring in the left lung base laterally.  IMPRESSION: COPD/chronic bronchitis, without acute superimposed process.  Aortic atherosclerosis.   Electronically Signed   By: Abigail Miyamoto M.D.   On: 11/13/2014 21:36    EKG:   Orders placed or performed during the hospital encounter of 11/13/14  . ED EKG  . ED EKG    IMPRESSION AND PLAN:   Principal Problem:   COPD exacerbation Active Problems:   CAD (coronary artery disease)   Hypertension   Hyperglycemia   #1 acute COPD exacerbation: She has decreased oxygen saturation into the 80%'s, respiratory distress with any exertion. We'll continue with nebulizers, steroids. She follows with Dr. Raul Del as an outpatient. Not requiring supplemental oxygen at rest.  #2 coronary artery disease with history of stenting: She does not have overt chest pain but does have significant shortness of breath with exertion. We'll cycle cardiac enzymes and monitor on telemetry. Her last stress echocardiogram was December 2015 she has a normal EF and no signs of ischemia. She has frequent PVCs, but is in normal sinus rhythm at this time.  #3 hypertension: Patient is hypertensive on presentation with blood pressure initially 195/78. She  states that she has missed her blood pressure medications. We'll resume home medications. Supply as needed hydralazine. Monitor closely.  #4 hyperglycemia: Likely due to steroid's. Will check hemoglobin A1c. We'll continue to monitor carefully. Supply sliding scale insulin if needed  #5 leukocytosis: Likely due to steroid's. Continue to monitor  #6 chronic kidney disease stage III: This is stable.  #7 prophylaxis: heparin for DVT prophylaxis. No GI prophylaxis at this time   All the records are reviewed and case discussed with ED provider. Management plans discussed with the patient, family and they are in agreement.  CODE STATUS: Full   TOTAL TIME TAKING CARE OF THIS PATIENT: 45 minutes.    Myrtis Ser M.D on 11/14/2014 at 7:53 AM  Between 7am to 6pm - Pager - [phone]  After 6pm go to [email or web address] -  password [email or web address]  [facility]  Office  [phone]  CC: Primary care physician; Einar Pheasant, MD

## 2014-11-14 NOTE — ED Notes (Signed)
Pt discharged home after verbalizing understanding of discharge instructions; nad noted. 

## 2014-11-14 NOTE — ED Notes (Signed)
Pt brought to triage via wheelchair. Pt reports she was seen here earlier today and went home even thought the doctor wanted her to stay. Pt reports she has been short of breath and wheezing. Pt reports she used her albuterol neb at home twice this morning.

## 2014-11-14 NOTE — Progress Notes (Signed)
Troponin #3 results are 0.03, improved.

## 2014-11-14 NOTE — Plan of Care (Addendum)
Problem: Discharge Progression Outcomes Goal: Discharge plan in place and appropriate Individualization Outcome: Progressing Pt lives at home alone Hx of HTN, COPD, CAD, asthma, GERD and emphysema controlled with home meds. Goal: Other Discharge Outcomes/Goals Outcome: Progressing Plan of care progress to goal: Pt admitted with COPD exacerbation. No c/o pain. On IV solumedrol and scheduled breathing treatments. On room air, O2 sats in mid 90s.  BP elevated on admission, MD aware. Home meds given, BP improved. On tele, ST with occasional PVCs and PACs on admission. Now SR with PVCs, MD aware. Ambulated patient, desatted to 87% on room air. Pt 93% on room air at rest.

## 2014-11-15 LAB — CBC
HCT: 40.8 % (ref 35.0–47.0)
Hemoglobin: 13.2 g/dL (ref 12.0–16.0)
MCH: 26.3 pg (ref 26.0–34.0)
MCHC: 32.3 g/dL (ref 32.0–36.0)
MCV: 81.4 fL (ref 80.0–100.0)
Platelets: 324 10*3/uL (ref 150–440)
RBC: 5.01 MIL/uL (ref 3.80–5.20)
RDW: 17.3 % — AB (ref 11.5–14.5)
WBC: 18.9 10*3/uL — AB (ref 3.6–11.0)

## 2014-11-15 LAB — BASIC METABOLIC PANEL
Anion gap: 9 (ref 5–15)
BUN: 35 mg/dL — AB (ref 6–20)
CALCIUM: 9.1 mg/dL (ref 8.9–10.3)
CHLORIDE: 97 mmol/L — AB (ref 101–111)
CO2: 27 mmol/L (ref 22–32)
CREATININE: 1.06 mg/dL — AB (ref 0.44–1.00)
GFR calc non Af Amer: 49 mL/min — ABNORMAL LOW (ref >60)
GFR, EST AFRICAN AMERICAN: 57 mL/min — AB (ref >60)
Glucose, Bld: 198 mg/dL — ABNORMAL HIGH (ref 65–99)
Potassium: 4 mmol/L (ref 3.5–5.1)
Sodium: 133 mmol/L — ABNORMAL LOW (ref 135–145)

## 2014-11-15 LAB — GLUCOSE, CAPILLARY: GLUCOSE-CAPILLARY: 191 mg/dL — AB (ref 65–99)

## 2014-11-15 MED ORDER — TIOTROPIUM BROMIDE MONOHYDRATE 18 MCG IN CAPS: 18.0000 ug | ORAL_CAPSULE | Freq: Every day | RESPIRATORY_TRACT | Status: DC

## 2014-11-15 NOTE — Plan of Care (Addendum)
Problem: Discharge Progression Outcomes Goal: Other Discharge Outcomes/Goals Outcome: Progressing Plan of care progress to goal: A&O. No c/o pain. On IV solumedrol and scheduled breathing treatments. On room air, O2 sats in mid 90s.   VSS. On tele, SR/ST with occasional PVCs and PACs. Up to BR voiding.

## 2014-11-15 NOTE — Discharge Instructions (Signed)
Take it easy this week, rest and avoid lung irritants. Follow up with either your primary care doctor or your pulmonologist this week.   DIET:  Cardiac diet  DISCHARGE CONDITION:  Fair  ACTIVITY:  Activity as tolerated  OXYGEN:  Home Oxygen: No.   Oxygen Delivery: room air  DISCHARGE LOCATION:  home   If you experience worsening of your admission symptoms, develop shortness of breath, life threatening emergency, suicidal or homicidal thoughts you must seek medical attention immediately by calling 911 or calling your MD immediately  if symptoms less severe.  You Must read complete instructions/literature along with all the possible adverse reactions/side effects for all the Medicines you take and that have been prescribed to you. Take any new Medicines after you have completely understood and accpet all the possible adverse reactions/side effects.   Please note  You were cared for by a hospitalist during your hospital stay. If you have any questions about your discharge medications or the care you received while you were in the hospital after you are discharged, you can call the unit and asked to speak with the hospitalist on call if the hospitalist that took care of you is not available. Once you are discharged, your primary care physician will handle any further medical issues. Please note that NO REFILLS for any discharge medications will be authorized once you are discharged, as it is imperative that you return to your primary care physician (or establish a relationship with a primary care physician if you do not have one) for your aftercare needs so that they can reassess your need for medications and monitor your lab values.

## 2014-11-15 NOTE — Discharge Summary (Signed)
[place] [facility] [place] [facility]   PATIENT NAME: Tina [name]    MR#:  [id number]  DATE OF BIRTH:  1934/10/08  DATE OF ADMISSION:  11/14/2014 ADMITTING PHYSICIAN: Aldean Jewett, MD  DATE OF DISCHARGE: 11/15/2014  PRIMARY CARE PHYSICIAN: Einar Pheasant, MD    ADMISSION DIAGNOSIS:  Chronic obstructive pulmonary disease, unspecified COPD, unspecified chronic bronchitis type [J44.9]  DISCHARGE DIAGNOSIS:  Principal Problem:   COPD exacerbation Active Problems:   CAD (coronary artery disease)   Hypertension   Hyperglycemia   SECONDARY DIAGNOSIS:   Past Medical History  Diagnosis Date  . CAD (coronary artery disease)   . Hypertension   . Hypercholesterolemia   . COPD (chronic obstructive pulmonary disease)   . Osteoporosis   . History of colon polyps   . Hyperglycemia   . Polycythemia vera(238.4)   . Renal cyst   . Asthma   . Emphysema lung   . GERD (gastroesophageal reflux disease)   . Anemia     HOSPITAL COURSE:   #1 acute COPD exacerbation: Desaturated station to 54 with exertion on admission. She had respiratory distress with any exertion even just getting up to use the bathroom. She was treated for 24 hours with IV Solu-Medrol, every 4 hours nebulizer treatments and is much better this morning. Her ambulatory O2 saturation is 94% on room air. She is being discharged with a Medrol Dosepak which was actually prescribed by Dr. Edd Fabian in the ED yesterday. At home she has an albuterol rescue inhaler, Symbicort twice a day inhaler, DuoNeb solution and a nebulizer machine which she uses every 6 hours as needed. She will follow up with either her primary care physician or pulmonologist this week. She does not currently smoke cigarettes. She has agreed to rest and avoid lung irritants for the rest of the week.  #2 coronary artery disease with history of stenting: She did not have overt chest pain but had significant shortness of  breath with exertion. Her last stress echocardiogram was December 2015 she has a normal EF and no signs of ischemia. Cardiac enzymes were cycled and were negative. She was observed on telemetry with no unusual rhythm. No EKG changes. No signs of ACS  #3 hypertension: Continue on Maxzide and metoprolol   #4 hyperglycemia: Likely due to steroid's. A1c is 6.0. Blood sugars will likely return to normal after she is off steroid's.  #5 leukocytosis: Likely due to steroid's.   #6 chronic kidney disease stage III: This is stable.   DISCHARGE CONDITIONS:   Fair   CONSULTS OBTAINED:     None  DRUG ALLERGIES:   Allergies  Allergen Reactions  . Evista [Raloxifene] Other (See Comments)    Reaction:  Night sweats   . Fluticasone-Salmeterol Other (See Comments)    Reaction:  Cough     DISCHARGE MEDICATIONS:   Current Discharge Medication List    CONTINUE these medications which have NOT CHANGED   Details  albuterol (PROVENTIL HFA;VENTOLIN HFA) 108 (90 BASE) MCG/ACT inhaler Inhale 2 puffs into the lungs every 6 (six) hours as needed for wheezing or shortness of breath. Qty: 1 Inhaler, Refills: 0    aspirin EC 81 MG tablet Take 81 mg by mouth daily.    budesonide-formoterol (SYMBICORT) 160-4.5 MCG/ACT inhaler Inhale 2 puffs into the lungs 2 (two) times daily. Qty: 1 Inhaler, Refills: 5    Calcium Carbonate-Vitamin D (CALCIUM-D) 600-400 MG-UNIT TABS Take 1 tablet by mouth daily.    fluticasone (  FLONASE) 50 MCG/ACT nasal spray Place 2 sprays into the nose daily. Qty: 16 g, Refills: 5    ipratropium-albuterol (DUONEB) 0.5-2.5 (3) MG/3ML SOLN Take 3 mLs by nebulization every 6 (six) hours as needed (for shortness of breath). Qty: 36 mL, Refills: 0    lovastatin (MEVACOR) 40 MG tablet Take 40 mg by mouth daily.    methylPREDNISolone (MEDROL DOSEPAK) 4 MG TBPK tablet Take according to package instructions. Qty: 1 tablet, Refills: 0    metoprolol tartrate (LOPRESSOR) 25 MG tablet Take  12.5 mg by mouth 2 (two) times daily.    triamterene-hydrochlorothiazide (MAXZIDE-25) 37.5-25 MG per tablet Take 1 tablet by mouth  daily Qty: 90 tablet, Refills: 1      STOP taking these medications     predniSONE (DELTASONE) 20 MG tablet          DISCHARGE INSTRUCTIONS:   Take it easy this week, rest and avoid lung irritants. Follow up with either your primary care doctor or your pulmonologist this week.   DIET:  Cardiac diet  DISCHARGE CONDITION:  Fair  ACTIVITY:  Activity as tolerated  OXYGEN:  Home Oxygen: No.   Oxygen Delivery: room air  DISCHARGE LOCATION:  home   If you experience worsening of your admission symptoms, develop shortness of breath, life threatening emergency, suicidal or homicidal thoughts you must seek medical attention immediately by calling 911 or calling your MD immediately  if symptoms less severe.  You Must read complete instructions/literature along with all the possible adverse reactions/side effects for all the Medicines you take and that have been prescribed to you. Take any new Medicines after you have completely understood and accpet all the possible adverse reactions/side effects.   Please note  You were cared for by a hospitalist during your hospital stay. If you have any questions about your discharge medications or the care you received while you were in the hospital after you are discharged, you can call the unit and asked to speak with the hospitalist on call if the hospitalist that took care of you is not available. Once you are discharged, your primary care physician will handle any further medical issues. Please note that NO REFILLS for any discharge medications will be authorized once you are discharged, as it is imperative that you return to your primary care physician (or establish a relationship with a primary care physician if you do not have one) for your aftercare needs so that they can reassess your need for medications  and monitor your lab values.  Today   CHIEF COMPLAINT:   Chief Complaint  Patient presents with  . Shortness of Breath    HISTORY OF PRESENT ILLNESS:  Tina [name] is a 80 y.o. female with a known history of COPD, coronary artery disease status post PCI in 2003, hypertension presents with 4 weeks of gradually worsening shortness of breath. She reports that she has these types of symptoms annually due to environmental allergens, they usually resolve with a prednisone taper from her primary care physician. This time symptoms have been getting progressively worse. She describes wheezing, coughing, production of clear sputum, sensation of chest tightness and inability to take a deep breath. For the past week she has been very short of breath with any exertion. She's been using her albuterol inhaler several times a day. She denies fevers or chills, nausea vomiting. She does not have overt chest pain but the chest feels very tight. Sputum is clear, no hemoptysis. She quit smoking about 10 years  ago prior to that she was a one pack per day smoker for 45 years.   She actually presented to the emergency room last night and admission was advised but she declined. She went home to take care of her sister who is currently bedbound due to a back injury. She returned once she realized she was so short of breath she could not assist her sister at all. In the emergency room she has dyspnea with exertion with decreasing oxygen saturation to the 80%'s and tachycardia on ambulation. She is being admitted for COPD exacerbation.  VITAL SIGNS:  Blood pressure 155/69, pulse 69, temperature 98.1 F (36.7 C), temperature source Oral, resp. rate 18, height 5\' 5"  (1.651 m), weight 63.05 kg (139 lb), SpO2 94 %.  I/O:   Intake/Output Summary (Last 24 hours) at 11/15/14 1219 Last data filed at 11/15/14 0925  Gross per 24 hour  Intake    600 ml  Output    750 ml  Net   -150 ml    PHYSICAL EXAMINATION:  GENERAL:   80 y.o.-year-old patient sitting up no acute distress.  EYES: Pupils equal, round, reactive to light and accommodation. No scleral icterus. Extraocular muscles intact.  HEENT: Head atraumatic, normocephalic. Oropharynx and nasopharynx clear.  NECK:  Supple, no jugular venous distention. No thyroid enlargement, no tenderness.  LUNGS: Normal breath sounds bilaterally, no wheezing, rales, rhonchi or crepitation. No use of accessory muscles of respiration.  good air movement  CARDIOVASCULAR: S1, S2 normal. No murmurs, rubs, or gallops.  ABDOMEN: Soft, non-tender, non-distended. Bowel sounds present. No organomegaly or mass.  EXTREMITIES: No pedal edema, cyanosis, or clubbing.  NEUROLOGIC: Cranial nerves II through XII are intact. Muscle strength 5/5 in all extremities. Sensation intact. Gait not checked.  PSYCHIATRIC: The patient is alert and oriented x 3.  SKIN: No obvious rash, lesion, or ulcer.   DATA REVIEW:   CBC  Recent Labs Lab 11/15/14 0525  WBC 18.9*  HGB 13.2  HCT 40.8  PLT 324    Chemistries   Recent Labs Lab 11/13/14 1637  11/15/14 0525  NA 138  < > 133*  K 3.7  < > 4.0  CL 100*  < > 97*  CO2 23  < > 27  GLUCOSE 264*  < > 198*  BUN 30*  < > 35*  CREATININE 1.34*  < > 1.06*  CALCIUM 9.2  < > 9.1  AST 37  --   --   ALT 29  --   --   ALKPHOS 56  --   --   BILITOT 0.5  --   --   < > = values in this interval not displayed.  Cardiac Enzymes  Recent Labs Lab 11/14/14 1946  TROPONINI <0.03    Microbiology Results  Results for orders placed or performed in visit on 08/11/14  CULTURE, URINE COMPREHENSIVE     Status: None   Collection Time: 08/11/14  9:19 AM  Result Value Ref Range Status   Colony Count NO GROWTH  Final   Organism ID, Bacteria NO GROWTH  Final    RADIOLOGY:  Dg Chest 2 View  11/13/2014   CLINICAL DATA:  Shortness of breath. Chest congestion for 3 weeks. COPD. Ex-smoker.  EXAM: CHEST  2 VIEW  COMPARISON:  11/08/2014  FINDINGS: Mild  hyperinflation. Lateral view degraded by patient arm position. Midline trachea. Normal heart size. Atherosclerosis in the transverse aorta. No pleural effusion or pneumothorax. Diffuse peribronchial thickening. Scarring in the left lung  base laterally.  IMPRESSION: COPD/chronic bronchitis, without acute superimposed process.  Aortic atherosclerosis.   Electronically Signed   By: Abigail Miyamoto M.D.   On: 11/13/2014 21:36    EKG:   Orders placed or performed in visit on 11/14/14  . EKG 12-Lead      Management plans discussed with the patient, family and they are in agreement.  CODE STATUS:     Code Status Orders        Start     Ordered   11/14/14 0858  Full code   Continuous     11/14/14 0857      TOTAL TIME TAKING CARE OF THIS PATIENT: 40 minutes.    Myrtis Ser M.D on 11/15/2014 at 12:19 PM  Between 7am to 6pm - Pager - [phone]  After 6pm go to [email or web address] - password [email or web address] [facility]  Office  [phone]  CC: Primary care physician; Einar Pheasant, MD

## 2014-11-15 NOTE — Plan of Care (Signed)
Problem: Discharge Progression Outcomes Goal: Other Discharge Outcomes/Goals Outcome: Progressing Plan of Care Progress to Goal: Pt feels much improved from yesterday.  Independent to BR.  Did ambulation test was 95% at rest w/out O2 and 94% w/exertion w/out O2.  Anxious to get home b/c she's taking care of her sister.  Son also lives w/her and has hx of brain ca but is independent.  Per dr - she is to take it easy and rest from her line dancing.  Also advised that heat and air quality affects her breathing and monitor.  IV removed.  Tele d/ced.  Pt going home on prednisone taper she got in the ED.

## 2014-11-17 ENCOUNTER — Telehealth: Payer: Self-pay | Admitting: *Deleted

## 2014-11-17 NOTE — Telephone Encounter (Signed)
Pt was discharged from hospital 11/15/14. Pt has appt already scheduled with Dr. Nicki Reaper for tomorrow, 11/18/14. Called pt, confirmed she will be at appt. States she is improving, no current problems or concerns.

## 2014-11-18 ENCOUNTER — Encounter: Payer: Self-pay | Admitting: Internal Medicine

## 2014-11-18 ENCOUNTER — Encounter (INDEPENDENT_AMBULATORY_CARE_PROVIDER_SITE_OTHER): Payer: Self-pay

## 2014-11-18 ENCOUNTER — Ambulatory Visit (INDEPENDENT_AMBULATORY_CARE_PROVIDER_SITE_OTHER): Payer: Medicare Other | Admitting: Internal Medicine

## 2014-11-18 VITALS — BP 130/80 | HR 67 | Temp 98.7°F | Ht 65.0 in | Wt 139.1 lb

## 2014-11-18 DIAGNOSIS — I10 Essential (primary) hypertension: Secondary | ICD-10-CM | POA: Diagnosis not present

## 2014-11-18 DIAGNOSIS — I2510 Atherosclerotic heart disease of native coronary artery without angina pectoris: Secondary | ICD-10-CM | POA: Diagnosis not present

## 2014-11-18 DIAGNOSIS — J441 Chronic obstructive pulmonary disease with (acute) exacerbation: Secondary | ICD-10-CM | POA: Diagnosis not present

## 2014-11-18 DIAGNOSIS — R739 Hyperglycemia, unspecified: Secondary | ICD-10-CM | POA: Diagnosis not present

## 2014-11-18 MED ORDER — PREDNISONE 10 MG PO TABS: ORAL_TABLET | ORAL | Status: DC

## 2014-11-18 MED ORDER — CEFDINIR 300 MG PO CAPS: 300.0000 mg | ORAL_CAPSULE | Freq: Two times a day (BID) | ORAL | Status: DC

## 2014-11-18 NOTE — Progress Notes (Signed)
Pre visit review using our clinic review tool, if applicable. No additional management support is needed unless otherwise documented below in the visit note. 

## 2014-11-18 NOTE — Assessment & Plan Note (Signed)
Followed by Dr Saralyn Pilar.  Just evaluated.

## 2014-11-18 NOTE — Assessment & Plan Note (Signed)
Blood pressure elevated on my check today.  Treat current exacerbation.  Hold on adjusting medication.  Follow pressures.

## 2014-11-18 NOTE — Assessment & Plan Note (Signed)
Recently admitted for COPD exacerbation.  Some better, but still with increased cough, congestion and sob.  Treat with prednisone taper starting at 60mg  and decreasing by 5mg  each day until off.  omnicef as directed.  Probiotics as directed.  Continue inhalers and nebs.  Follow.  Get her back in soon to reassess.

## 2014-11-18 NOTE — Progress Notes (Signed)
[name], female   DOB: March 28, 1935, 80 y.o.   MRN: [id number]   Subjective:    [name], female    DOB: 1934/09/07, 80 y.o.   MRN: [id number]  HPI  Patient here for a hospital follow up.  She was admitted 11/14/14 with COPD exacerbation.   See hospital discharge note for details.  She was given atc nebs.  Discharged on medrol dose pack and instructed to continue her inhalers.  She states she does feel better.  She is better able to walk around her house.  Still with increased cough.  Mostly non productive.  No fever.  No vomiting.  No acid reflux.  She is eating.  Prior to her admission, she was given 5 days of levaquin.  Was not discharged on abx.  She is using her neb regularly.     Past Medical History  Diagnosis Date  . CAD (coronary artery disease)   . Hypertension   . Hypercholesterolemia   . COPD (chronic obstructive pulmonary disease)   . Osteoporosis   . History of colon polyps   . Hyperglycemia   . Polycythemia vera(238.4)   . Renal cyst   . Asthma   . Emphysema lung   . GERD (gastroesophageal reflux disease)   . Anemia     Current Outpatient Prescriptions on File Prior to Visit  Medication Sig Dispense Refill  . albuterol (PROVENTIL HFA;VENTOLIN HFA) 108 (90 BASE) MCG/ACT inhaler Inhale 2 puffs into the lungs every 6 (six) hours as needed for wheezing or shortness of breath. 1 Inhaler 0  . aspirin EC 81 MG tablet Take 81 mg by mouth daily.    . budesonide-formoterol (SYMBICORT) 160-4.5 MCG/ACT inhaler Inhale 2 puffs into the lungs 2 (two) times daily. 1 Inhaler 5  . Calcium Carbonate-Vitamin D (CALCIUM-D) 600-400 MG-UNIT TABS Take 1 tablet by mouth daily.    . fluticasone (FLONASE) 50 MCG/ACT nasal spray Place 2 sprays into the nose daily. 16 g 5  . ipratropium-albuterol (DUONEB) 0.5-2.5 (3) MG/3ML SOLN Take 3 mLs by nebulization every 6 (six) hours as needed (for shortness of breath). 36 mL 0  . lovastatin (MEVACOR) 40 MG tablet Take 40 mg  by mouth daily.    . methylPREDNISolone (MEDROL DOSEPAK) 4 MG TBPK tablet Take according to package instructions. 1 tablet 0  . metoprolol tartrate (LOPRESSOR) 25 MG tablet Take 12.5 mg by mouth 2 (two) times daily.    Marland Kitchen triamterene-hydrochlorothiazide (MAXZIDE-25) 37.5-25 MG per tablet Take 1 tablet by mouth  daily 90 tablet 1   No current facility-administered medications on file prior to visit.    Review of Systems  Constitutional: Positive for fatigue. Negative for appetite change and unexpected weight change.  HENT: Negative for congestion (no nasal congestion.) and sinus pressure.   Respiratory: Positive for cough and shortness of breath. Negative for chest tightness.   Cardiovascular: Negative for chest pain, palpitations and leg swelling.  Gastrointestinal: Negative for nausea, vomiting, abdominal pain and diarrhea.  Neurological: Negative for dizziness, light-headedness and headaches.  Psychiatric/Behavioral: Negative for dysphoric mood and agitation.       Increased stress taking care of her sister.         Objective:    Physical Exam  HENT:  Nose: Nose normal.  Mouth/Throat: Oropharynx is clear and moist.  Neck: Neck supple. No thyromegaly present.  Cardiovascular: Normal rate and regular rhythm.   Pulmonary/Chest: Breath sounds normal.  Some increased cough with forced expiration.  Abdominal: Soft. Bowel sounds are normal. There is no tenderness.  Musculoskeletal: She exhibits no edema or tenderness.  Lymphadenopathy:    She has no cervical adenopathy.  Psychiatric: She has a normal mood and affect. Her behavior is normal.    BP 130/80 mmHg  Pulse 67  Temp(Src) 98.7 F (37.1 C) (Oral)  Ht 5\' 5"  (1.651 m)  Wt 139 lb 2 oz (63.107 kg)  BMI 23.15 kg/m2  SpO2 94% Wt Readings from Last 3 Encounters:  11/18/14 139 lb 2 oz (63.107 kg)  11/14/14 139 lb (63.05 kg)  11/13/14 142 lb (64.411 kg)     Lab Results  Component Value Date   WBC 18.9* 11/15/2014   HGB  13.2 11/15/2014   HCT 40.8 11/15/2014   PLT 324 11/15/2014   GLUCOSE 198* 11/15/2014   CHOL 166 08/11/2014   TRIG 74.0 08/11/2014   HDL 71.90 08/11/2014   LDLCALC 79 08/11/2014   ALT 29 11/13/2014   AST 37 11/13/2014   NA 133* 11/15/2014   K 4.0 11/15/2014   CL 97* 11/15/2014   CREATININE 1.06* 11/15/2014   BUN 35* 11/15/2014   CO2 27 11/15/2014   TSH 2.16 08/11/2014   HGBA1C 6.0 11/14/2014       Assessment & Plan:   Problem List Items Addressed This Visit    CAD (coronary artery disease)    Followed by Dr Saralyn Pilar.  Just evaluated.        COPD exacerbation - Primary    Recently admitted for COPD exacerbation.  Some better, but still with increased cough, congestion and sob.  Treat with prednisone taper starting at 60mg  and decreasing by 5mg  each day until off.  omnicef as directed.  Probiotics as directed.  Continue inhalers and nebs.  Follow.  Get her back in soon to reassess.       Relevant Medications   predniSONE (DELTASONE) 10 MG tablet   Hyperglycemia    Follow sugars on prednisone.  a1c just checked in the hospital - 6.0.        Hypertension    Blood pressure elevated on my check today.  Treat current exacerbation.  Hold on adjusting medication.  Follow pressures.            Einar Pheasant, MD

## 2014-11-18 NOTE — Assessment & Plan Note (Signed)
Follow sugars on prednisone.  a1c just checked in the hospital - 6.0.

## 2014-11-24 ENCOUNTER — Encounter: Payer: Self-pay | Admitting: Family Medicine

## 2014-11-24 ENCOUNTER — Other Ambulatory Visit: Payer: Self-pay | Admitting: Family Medicine

## 2014-11-24 DIAGNOSIS — Z1501 Genetic susceptibility to malignant neoplasm of breast: Secondary | ICD-10-CM | POA: Insufficient documentation

## 2014-11-24 DIAGNOSIS — Z1509 Genetic susceptibility to other malignant neoplasm: Secondary | ICD-10-CM

## 2014-11-24 DIAGNOSIS — Z1589 Genetic susceptibility to other disease: Secondary | ICD-10-CM | POA: Insufficient documentation

## 2014-11-25 ENCOUNTER — Encounter: Payer: Self-pay | Admitting: Family Medicine

## 2014-11-25 ENCOUNTER — Other Ambulatory Visit: Payer: Self-pay | Admitting: Family Medicine

## 2014-11-25 DIAGNOSIS — Z87891 Personal history of nicotine dependence: Secondary | ICD-10-CM

## 2014-11-25 HISTORY — DX: Personal history of nicotine dependence: Z87.891

## 2014-11-30 ENCOUNTER — Other Ambulatory Visit: Payer: Self-pay | Admitting: Family Medicine

## 2014-11-30 DIAGNOSIS — Z87891 Personal history of nicotine dependence: Secondary | ICD-10-CM

## 2014-12-02 ENCOUNTER — Ambulatory Visit
Admission: RE | Admit: 2014-12-02 | Discharge: 2014-12-02 | Disposition: A | Discharge status: Home / Self Care | Payer: Medicare Other | Source: Ambulatory Visit | Attending: Family Medicine | Admitting: Family Medicine

## 2014-12-02 ENCOUNTER — Other Ambulatory Visit: Payer: Self-pay | Admitting: Family Medicine

## 2014-12-02 DIAGNOSIS — I2510 Atherosclerotic heart disease of native coronary artery without angina pectoris: Secondary | ICD-10-CM | POA: Diagnosis not present

## 2014-12-02 DIAGNOSIS — J439 Emphysema, unspecified: Secondary | ICD-10-CM | POA: Insufficient documentation

## 2014-12-02 DIAGNOSIS — Z87891 Personal history of nicotine dependence: Secondary | ICD-10-CM | POA: Insufficient documentation

## 2014-12-04 ENCOUNTER — Telehealth: Payer: Self-pay | Admitting: *Deleted

## 2014-12-04 NOTE — Telephone Encounter (Signed)
  Oncology Nurse Navigator Documentation    Navigator Encounter Type: Telephone;Screening (12/04/14 1100)               Notified patient of LDCT lung cancer screening results of Lung Rads 2 finding with recommendation for 12 month follow up imaging. Also notified of incidental findings mentioned below. Patient verbalized understanding.  1. Lung-RADS Category 2, benign appearance or behavior. Continue annual screening with low-dose chest CT without contrast in 12 months. 2. Atherosclerosis, including left main and 3 vessel coronary artery disease. Status post PTCI to the left circumflex coronary artery. Again noted is a large calcified atherosclerotic plaque again noted in the distal descending thoracic aorta and proximal abdominal aorta, encroaching upon the origin of the celiac axis. Assessment for potential risk factor modification, dietary therapy or pharmacologic therapy may be warranted, if clinically indicated. 3. Mild diffuse bronchial wall thickening with mild centrilobular and paraseptal emphysema; imaging findings suggestive of underlying COPD.

## 2014-12-11 ENCOUNTER — Ambulatory Visit: Payer: Medicare Other | Admitting: Internal Medicine

## 2014-12-21 ENCOUNTER — Ambulatory Visit: Payer: Medicare Other | Admitting: Internal Medicine

## 2014-12-23 ENCOUNTER — Inpatient Hospital Stay: Payer: Medicare Other

## 2014-12-23 ENCOUNTER — Inpatient Hospital Stay: Payer: Medicare Other | Attending: Family Medicine

## 2015-01-19 ENCOUNTER — Encounter: Payer: Self-pay | Admitting: Internal Medicine

## 2015-01-19 ENCOUNTER — Ambulatory Visit (INDEPENDENT_AMBULATORY_CARE_PROVIDER_SITE_OTHER): Payer: Medicare Other | Admitting: Internal Medicine

## 2015-01-19 VITALS — BP 160/80 | HR 95 | Temp 98.4°F | Ht 65.0 in | Wt 138.2 lb

## 2015-01-19 DIAGNOSIS — E7800 Pure hypercholesterolemia, unspecified: Secondary | ICD-10-CM

## 2015-01-19 DIAGNOSIS — I10 Essential (primary) hypertension: Secondary | ICD-10-CM

## 2015-01-19 DIAGNOSIS — R739 Hyperglycemia, unspecified: Secondary | ICD-10-CM | POA: Diagnosis not present

## 2015-01-19 DIAGNOSIS — Z23 Encounter for immunization: Secondary | ICD-10-CM | POA: Diagnosis not present

## 2015-01-19 DIAGNOSIS — Z860100 Personal history of colon polyps, unspecified: Secondary | ICD-10-CM

## 2015-01-19 DIAGNOSIS — I2510 Atherosclerotic heart disease of native coronary artery without angina pectoris: Secondary | ICD-10-CM

## 2015-01-19 DIAGNOSIS — D45 Polycythemia vera: Secondary | ICD-10-CM | POA: Diagnosis not present

## 2015-01-19 DIAGNOSIS — N281 Cyst of kidney, acquired: Secondary | ICD-10-CM

## 2015-01-19 DIAGNOSIS — R200 Anesthesia of skin: Secondary | ICD-10-CM

## 2015-01-19 DIAGNOSIS — Q6100 Congenital renal cyst, unspecified: Secondary | ICD-10-CM

## 2015-01-19 DIAGNOSIS — J449 Chronic obstructive pulmonary disease, unspecified: Secondary | ICD-10-CM

## 2015-01-19 NOTE — Progress Notes (Signed)
Pre-visit discussion using our clinic review tool. No additional management support is needed unless otherwise documented below in the visit note.  

## 2015-01-19 NOTE — Progress Notes (Signed)
[name], female   DOB: 1934-08-02, 81 y.o.   MRN: [id number]   Subjective:    [name], female    DOB: 1934/06/10, 80 y.o.   MRN: [id number]  HPI  Patient here for a scheduled follow up.  States she has been doing relatively well.  Stress has been some better.  Her sister is able to do more for herself.  She has noticed that her head feels numb on the right side.  Ear itching.  Using nasal spray.  No headache.  No dizziness.  Was questioning whether or not she had a sinus infection.  No fever.  Breathing stable.  No acid reflux.  No abdominal pain or cramping.  Bowels stable.     Past Medical History  Diagnosis Date  . CAD (coronary artery disease)   . Hypertension   . Hypercholesterolemia   . COPD (chronic obstructive pulmonary disease)   . Osteoporosis   . History of colon polyps   . Hyperglycemia   . Polycythemia vera(238.4)   . Renal cyst   . Asthma   . Emphysema lung   . GERD (gastroesophageal reflux disease)   . Anemia   . BRCA positive   . Personal history of tobacco use, presenting hazards to health 11/25/2014   Past Surgical History  Procedure Laterality Date  . Tonsilectomy/adenoidectomy with myringotomy    . Tubal ligation    . Angioplasty      coronary (x1)  . Coronary artery bypass graft    . Coronary angioplasty    . Colon surgery    . Salpingoophorectomy     Family History  Problem Relation Age of Onset  . Cirrhosis Father     died age 87  . Alcohol abuse Father   . Asthma Mother   . Congestive Heart Failure Mother   . Breast cancer Mother   . Lupus Sister   . Alcohol abuse Sister   . Breast cancer      2 (1/2 sisters)  . Ovarian cancer Sister    Social History   Social History  . Marital Status: Divorced    Spouse Name: N/A  . Number of Children: 2  . Years of Education: N/A   Social History Main Topics  . Smoking status: Former Smoker -- 1.00 packs/day for 45 years    Types: Cigarettes    Quit date:  11/11/2003  . Smokeless tobacco: Never Used  . Alcohol Use: 0.0 oz/week    0 Standard drinks or equivalent per week     Comment: occasional  . Drug Use: No  . Sexual Activity: Not Asked   Other Topics Concern  . None   Social History Narrative    Outpatient Encounter Prescriptions as of 01/19/2015  Medication Sig  . albuterol (PROVENTIL HFA;VENTOLIN HFA) 108 (90 BASE) MCG/ACT inhaler Inhale 2 puffs into the lungs every 6 (six) hours as needed for wheezing or shortness of breath.  Marland Kitchen aspirin EC 81 MG tablet Take 81 mg by mouth daily.  . budesonide-formoterol (SYMBICORT) 160-4.5 MCG/ACT inhaler Inhale 2 puffs into the lungs 2 (two) times daily.  . Calcium Carbonate-Vitamin D (CALCIUM-D) 600-400 MG-UNIT TABS Take 1 tablet by mouth daily.  . fluticasone (FLONASE) 50 MCG/ACT nasal spray Place 2 sprays into the nose daily.  Marland Kitchen ipratropium-albuterol (DUONEB) 0.5-2.5 (3) MG/3ML SOLN Take 3 mLs by nebulization every 6 (six) hours as needed (for shortness of breath).  . lovastatin (MEVACOR) 40 MG tablet Take  40 mg by mouth daily.  . metoprolol tartrate (LOPRESSOR) 25 MG tablet Take 12.5 mg by mouth 2 (two) times daily.  Marland Kitchen triamterene-hydrochlorothiazide (MAXZIDE-25) 37.5-25 MG per tablet Take 1 tablet by mouth  daily  . [DISCONTINUED] cefdinir (OMNICEF) 300 MG capsule Take 1 capsule (300 mg total) by mouth 2 (two) times daily. (Patient not taking: Reported on 01/19/2015)  . [DISCONTINUED] methylPREDNISolone (MEDROL DOSEPAK) 4 MG TBPK tablet Take according to package instructions. (Patient not taking: Reported on 01/19/2015)  . [DISCONTINUED] predniSONE (DELTASONE) 10 MG tablet Take 6 tablets x 1 day and then decrease by 1/2 tablet per day until down to zero mg. (Patient not taking: Reported on 01/19/2015)   No facility-administered encounter medications on file as of 01/19/2015.    Review of Systems  Constitutional: Negative for fever, appetite change and unexpected weight change.  HENT: Positive for  congestion (some nasal congestion.  ). Negative for sinus pressure and sore throat.        Right side numb.  Ear itching.  Using nasal spray.  No headache.  No dizziness.    Eyes: Negative for pain and visual disturbance.  Respiratory: Negative for cough and chest tightness.        Breathing stable.    Cardiovascular: Negative for chest pain, palpitations and leg swelling.  Gastrointestinal: Negative for nausea, vomiting, abdominal pain and diarrhea.  Genitourinary: Negative for dysuria and difficulty urinating.  Musculoskeletal: Negative for back pain and joint swelling.  Skin: Negative for color change and rash.  Neurological: Negative for dizziness, light-headedness and headaches.  Psychiatric/Behavioral: Negative for dysphoric mood and agitation.       Objective:     Blood pressure rechecked by me:  148/70  Physical Exam  Constitutional: She appears well-developed and well-nourished. No distress.  HENT:  Right Ear: External ear normal.  Left Ear: External ear normal.  Nose: Nose normal.  Mouth/Throat: Oropharynx is clear and moist.  Reproducible change in sensation (numbness) - with palpation over the right lateral side (head).  No rash.   Eyes: Conjunctivae are normal. Right eye exhibits no discharge. Left eye exhibits no discharge.  Neck: Neck supple. No thyromegaly present.  Cardiovascular: Normal rate and regular rhythm.   Pulmonary/Chest: Breath sounds normal. No respiratory distress. She has no wheezes.  Abdominal: Soft. Bowel sounds are normal. There is no tenderness.  Musculoskeletal: She exhibits no edema or tenderness.  Lymphadenopathy:    She has no cervical adenopathy.  Skin: No rash noted. No erythema.  Psychiatric: She has a normal mood and affect. Her behavior is normal.    BP 160/80 mmHg  Pulse 95  Temp(Src) 98.4 F (36.9 C) (Oral)  Ht 5' 5" (1.651 m)  Wt 138 lb 4 oz (62.71 kg)  BMI 23.01 kg/m2  SpO2 97% Wt Readings from Last 3 Encounters:  01/19/15  138 lb 4 oz (62.71 kg)  12/02/14 140 lb (63.504 kg)  11/18/14 139 lb 2 oz (63.107 kg)     Lab Results  Component Value Date   WBC 18.9* 11/15/2014   HGB 13.2 11/15/2014   HCT 40.8 11/15/2014   PLT 324 11/15/2014   GLUCOSE 198* 11/15/2014   CHOL 166 08/11/2014   TRIG 74.0 08/11/2014   HDL 71.90 08/11/2014   LDLCALC 79 08/11/2014   ALT 29 11/13/2014   AST 37 11/13/2014   NA 133* 11/15/2014   K 4.0 11/15/2014   CL 97* 11/15/2014   CREATININE 1.06* 11/15/2014   BUN 35* 11/15/2014   CO2  27 11/15/2014   TSH 2.16 08/11/2014   HGBA1C 6.0 11/14/2014    Ct Chest Lung Ca Screen Low Dose W/o Cm  12/03/2014   CLINICAL DATA:  80 year old female former smoker (quit 12 years ago) with 45 pack-year history of smoking. Lung cancer screening examination.  EXAM: CT CHEST WITHOUT CONTRAST  TECHNIQUE: Multidetector CT imaging of the chest was performed following the standard protocol without IV contrast.  COMPARISON:  Lung cancer screening chest CT 11/13/2013.  FINDINGS: Mediastinum/Lymph Nodes: Heart size is normal. There is no significant pericardial fluid, thickening or pericardial calcification. There is atherosclerosis of the thoracic aorta, the great vessels of the mediastinum and the coronary arteries, including calcified atherosclerotic plaque in the left main, left anterior descending, left circumflex and right coronary arteries. Coronary artery stent in the left circumflex coronary artery. No pathologically enlarged mediastinal or hilar lymph nodes. Please note that accurate exclusion of hilar adenopathy is limited on noncontrast CT scans. Esophagus is unremarkable in appearance. No axillary lymphadenopathy. As noted on the prior examination, there is a very bulky eccentric calcified plaque in the distal descending thoracic aorta extending into the proximal suprarenal abdominal aorta, encroaching upon the origin of the celiac axis, best appreciated on images 59 and 60 of series 2.  Lungs/Pleura:  Multiple small pulmonary nodules are again noted in the lungs bilaterally, the largest of which is a subpleural nodule in the periphery of the right upper lobe (image 59 of series 3), which has a volume derived mean diameter of 3.4 mm (unchanged). No other suspicious appearing pulmonary nodules or masses are noted. No acute consolidative airspace disease. No pleural effusions. Mild diffuse bronchial wall thickening with very mild centrilobular and paraseptal emphysema.  Upper Abdomen: Unremarkable.  Musculoskeletal/Soft Tissues: There are no aggressive appearing lytic or blastic lesions noted in the visualized portions of the skeleton.  IMPRESSION: 1. Lung-RADS Category 2, benign appearance or behavior. Continue annual screening with low-dose chest CT without contrast in 12 months. 2. Atherosclerosis, including left main and 3 vessel coronary artery disease. Status post PTCI to the left circumflex coronary artery. Again noted is a large calcified atherosclerotic plaque again noted in the distal descending thoracic aorta and proximal abdominal aorta, encroaching upon the origin of the celiac axis. Assessment for potential risk factor modification, dietary therapy or pharmacologic therapy may be warranted, if clinically indicated. 3. Mild diffuse bronchial wall thickening with mild centrilobular and paraseptal emphysema; imaging findings suggestive of underlying COPD.   Electronically Signed   By: Vinnie Langton M.D.   On: 12/03/2014 10:01       Assessment & Plan:   Problem List Items Addressed This Visit    CAD (coronary artery disease)    Followed by Dr Saralyn Pilar.        COPD (chronic obstructive pulmonary disease)    Followed by Dr Raul Del.  CT as outlined.  Plan for repeat in one year.        History of colonic polyps    Colonoscopy 12/12/11 - as outlined.  Recommended f/u colonoscopy in three years.       Hypercholesterolemia    On lovastatin.  Low cholesterol diet and exercise.  Follow lipid  panel and liver function tests.        Relevant Orders   Lipid panel   Hepatic function panel   Hyperglycemia    Low carb diet and exercise.  Follow met b and a1c.       Relevant Orders   Hemoglobin A1c  Basic metabolic panel   Hypertension    Blood pressure elevated today.  Recheck improved.  Continue same medication regimen.  Follow pressures.  Follow metabolic panel.        Relevant Orders   CBC with Differential/Platelet   Numbness    Numbness noted on right side of her head.  Ear itching.  No rash.  No evidence of sinus infection.  Unclear etiology.  Treat with saline nasal spray and flonase nasal spray.  Follow symptoms.  Discussed neurology referral.       Polycythemia vera    Has been followed by Dr Cynda Acres.  Continue f/u with hematology.       Renal cyst    Followed by Dr Jacqlyn Larsen.         Other Visit Diagnoses    Encounter for immunization    -  Primary        Einar Pheasant, MD

## 2015-01-22 DIAGNOSIS — I10 Essential (primary) hypertension: Secondary | ICD-10-CM | POA: Diagnosis not present

## 2015-01-22 DIAGNOSIS — Z86010 Personal history of colonic polyps: Secondary | ICD-10-CM | POA: Diagnosis not present

## 2015-01-22 DIAGNOSIS — I2510 Atherosclerotic heart disease of native coronary artery without angina pectoris: Secondary | ICD-10-CM | POA: Diagnosis not present

## 2015-01-22 DIAGNOSIS — E780 Pure hypercholesterolemia: Secondary | ICD-10-CM | POA: Diagnosis not present

## 2015-01-22 LAB — CBC AND DIFFERENTIAL
HEMATOCRIT: 47 % — AB (ref 36–46)
HEMOGLOBIN: 15.5 g/dL (ref 12.0–16.0)
Neutrophils Absolute: 5 /uL
Platelets: 301 10*3/uL (ref 150–399)
WBC: 7.5 10^3/mL

## 2015-01-22 LAB — BASIC METABOLIC PANEL
BUN: 17 mg/dL (ref 4–21)
CREATININE: 1.1 mg/dL (ref 0.5–1.1)
Glucose: 128 mg/dL
POTASSIUM: 4.3 mmol/L (ref 3.4–5.3)
Sodium: 143 mmol/L (ref 137–147)

## 2015-01-22 LAB — LIPID PANEL
CHOLESTEROL: 166 mg/dL (ref 0–200)
HDL: 61 mg/dL (ref 35–70)
LDL Cholesterol: 88 mg/dL
TRIGLYCERIDES: 84 mg/dL (ref 40–160)

## 2015-01-22 LAB — HEMOGLOBIN A1C: Hgb A1c MFr Bld: 6 % (ref 4.0–6.0)

## 2015-01-24 ENCOUNTER — Encounter: Payer: Self-pay | Admitting: Internal Medicine

## 2015-01-24 DIAGNOSIS — R200 Anesthesia of skin: Secondary | ICD-10-CM | POA: Insufficient documentation

## 2015-01-24 NOTE — Assessment & Plan Note (Signed)
Low carb diet and exercise.  Follow met b and a1c.  

## 2015-01-24 NOTE — Assessment & Plan Note (Signed)
On lovastatin.  Low cholesterol diet and exercise.  Follow lipid panel and liver function tests.   

## 2015-01-24 NOTE — Assessment & Plan Note (Signed)
Followed by Dr Cope.  

## 2015-01-24 NOTE — Assessment & Plan Note (Signed)
Followed by Dr Raul Del.  CT as outlined.  Plan for repeat in one year.

## 2015-01-24 NOTE — Assessment & Plan Note (Signed)
Followed by Dr. Paraschos 

## 2015-01-24 NOTE — Assessment & Plan Note (Signed)
Has been followed by Dr Cynda Acres.  Continue f/u with hematology.

## 2015-01-24 NOTE — Assessment & Plan Note (Signed)
Numbness noted on right side of her head.  Ear itching.  No rash.  No evidence of sinus infection.  Unclear etiology.  Treat with saline nasal spray and flonase nasal spray.  Follow symptoms.  Discussed neurology referral.

## 2015-01-24 NOTE — Assessment & Plan Note (Signed)
Blood pressure elevated today.  Recheck improved.  Continue same medication regimen.  Follow pressures.  Follow metabolic panel.

## 2015-01-24 NOTE — Assessment & Plan Note (Signed)
Colonoscopy 12/12/11 - as outlined.  Recommended f/u colonoscopy in three years.

## 2015-01-27 ENCOUNTER — Encounter: Payer: Self-pay | Admitting: *Deleted

## 2015-01-28 ENCOUNTER — Encounter: Payer: Self-pay | Admitting: *Deleted

## 2015-02-03 ENCOUNTER — Inpatient Hospital Stay: Payer: Medicare Other

## 2015-02-03 ENCOUNTER — Inpatient Hospital Stay (HOSPITAL_BASED_OUTPATIENT_CLINIC_OR_DEPARTMENT_OTHER): Payer: Medicare Other | Admitting: Family Medicine

## 2015-02-03 ENCOUNTER — Inpatient Hospital Stay: Payer: Medicare Other | Attending: Family Medicine

## 2015-02-03 ENCOUNTER — Encounter: Payer: Self-pay | Admitting: Family Medicine

## 2015-02-03 VITALS — BP 187/83 | HR 76 | Temp 97.9°F | Resp 20 | Ht 65.0 in | Wt 137.8 lb

## 2015-02-03 DIAGNOSIS — Z1501 Genetic susceptibility to malignant neoplasm of breast: Secondary | ICD-10-CM | POA: Diagnosis not present

## 2015-02-03 DIAGNOSIS — R0602 Shortness of breath: Secondary | ICD-10-CM | POA: Diagnosis not present

## 2015-02-03 DIAGNOSIS — J439 Emphysema, unspecified: Secondary | ICD-10-CM

## 2015-02-03 DIAGNOSIS — M818 Other osteoporosis without current pathological fracture: Secondary | ICD-10-CM

## 2015-02-03 DIAGNOSIS — Z79899 Other long term (current) drug therapy: Secondary | ICD-10-CM | POA: Insufficient documentation

## 2015-02-03 DIAGNOSIS — K219 Gastro-esophageal reflux disease without esophagitis: Secondary | ICD-10-CM | POA: Insufficient documentation

## 2015-02-03 DIAGNOSIS — R739 Hyperglycemia, unspecified: Secondary | ICD-10-CM

## 2015-02-03 DIAGNOSIS — D45 Polycythemia vera: Secondary | ICD-10-CM

## 2015-02-03 DIAGNOSIS — R05 Cough: Secondary | ICD-10-CM | POA: Insufficient documentation

## 2015-02-03 DIAGNOSIS — D649 Anemia, unspecified: Secondary | ICD-10-CM

## 2015-02-03 DIAGNOSIS — J449 Chronic obstructive pulmonary disease, unspecified: Secondary | ICD-10-CM

## 2015-02-03 DIAGNOSIS — Z87891 Personal history of nicotine dependence: Secondary | ICD-10-CM

## 2015-02-03 DIAGNOSIS — Z809 Family history of malignant neoplasm, unspecified: Secondary | ICD-10-CM | POA: Diagnosis not present

## 2015-02-03 DIAGNOSIS — E780 Pure hypercholesterolemia: Secondary | ICD-10-CM | POA: Diagnosis not present

## 2015-02-03 DIAGNOSIS — Z803 Family history of malignant neoplasm of breast: Secondary | ICD-10-CM | POA: Insufficient documentation

## 2015-02-03 DIAGNOSIS — Z7982 Long term (current) use of aspirin: Secondary | ICD-10-CM | POA: Diagnosis not present

## 2015-02-03 DIAGNOSIS — N281 Cyst of kidney, acquired: Secondary | ICD-10-CM | POA: Insufficient documentation

## 2015-02-03 DIAGNOSIS — J45909 Unspecified asthma, uncomplicated: Secondary | ICD-10-CM | POA: Insufficient documentation

## 2015-02-03 DIAGNOSIS — I2510 Atherosclerotic heart disease of native coronary artery without angina pectoris: Secondary | ICD-10-CM

## 2015-02-03 DIAGNOSIS — I10 Essential (primary) hypertension: Secondary | ICD-10-CM

## 2015-02-03 LAB — HEMATOCRIT: HCT: 46.6 % (ref 35.0–47.0)

## 2015-02-03 NOTE — Progress Notes (Signed)
[place]  [phone]  [phone]     Tina [name] DOB: 1935-04-18  MR#: [id number]  KZS#:[id number]  Patient Care Team: Einar Pheasant, MD as PCP - General (Internal Medicine)  CHIEF COMPLAINT:  Chief Complaint  Patient presents with  . Polycythemia vera  . Cough   Per previous notes from Dr. Inez Pilgrim, Patient also with significant family history for malignancy including mother with breast cancer, sister with ovarian cancer who was tested positive for BRCA mutation, niece with breast cancer, patient also positive for BRCA mutation.  INTERVAL HISTORY:  Patient is here for further evaluation and treatment consideration regarding polycythemia vera. Patient reports feeling well and denies any acute complaints. She has followed up with her primary care provider, Dr. Einar Pheasant, and pulmonologist, Dr. Raul Del. Patient was recently in the discharged from the hospital with shortness of breath and an asthma attack. She continues with some chronic shortness of breath but has recovered and is back in her normal state of health. She remains active and states that she participates in dance lessons at least twice weekly.   REVIEW OF SYSTEMS:   Review of Systems  Constitutional: Negative for fever, chills, weight loss, malaise/fatigue and diaphoresis.  HENT: Negative for congestion, ear discharge, ear pain, hearing loss, nosebleeds, sore throat and tinnitus.   Eyes: Negative for blurred vision, double vision, photophobia, pain, discharge and redness.  Respiratory: Positive for shortness of breath. Negative for cough, hemoptysis, sputum production, wheezing and stridor.        Chronic and unchanged  Cardiovascular: Negative for chest pain, palpitations, orthopnea, claudication, leg swelling and PND.  Gastrointestinal: Negative for heartburn, nausea, vomiting, abdominal pain, diarrhea, constipation, blood in stool and melena.  Genitourinary: Negative.     Musculoskeletal: Negative.   Skin: Negative.   Neurological: Negative for dizziness, tingling, focal weakness, seizures, weakness and headaches.  Endo/Heme/Allergies: Does not bruise/bleed easily.  Psychiatric/Behavioral: Negative for depression. The patient is not nervous/anxious and does not have insomnia.     As per HPI. Otherwise, a complete review of systems is negatve.   PAST MEDICAL HISTORY: Past Medical History  Diagnosis Date  . CAD (coronary artery disease)   . Hypertension   . Hypercholesterolemia   . COPD (chronic obstructive pulmonary disease)   . Osteoporosis   . History of colon polyps   . Hyperglycemia   . Polycythemia vera(238.4)   . Renal cyst   . Asthma   . Emphysema lung   . GERD (gastroesophageal reflux disease)   . Anemia   . BRCA positive   . Personal history of tobacco use, presenting hazards to health 11/25/2014    PAST SURGICAL HISTORY: Past Surgical History  Procedure Laterality Date  . Tonsilectomy/adenoidectomy with myringotomy    . Tubal ligation    . Angioplasty      coronary (x1)  . Coronary artery bypass graft    . Coronary angioplasty    . Colon surgery    . Salpingoophorectomy      FAMILY HISTORY Family History  Problem Relation Age of Onset  . Cirrhosis Father     died age 57  . Alcohol abuse Father   . Asthma Mother   . Congestive Heart Failure Mother   . Breast cancer Mother   . Lupus Sister   . Alcohol abuse Sister   . Breast cancer Mother     2 (1/2 sisters)  . Ovarian cancer Sister   . Osteoporosis Sister   . Osteoarthritis Mother   .  Colon cancer Mother   . Skin cancer Sister     GYNECOLOGIC HISTORY:  No LMP recorded. Patient is postmenopausal.     ADVANCED DIRECTIVES:    HEALTH MAINTENANCE: Social History  Substance Use Topics  . Smoking status: Former Smoker -- 1.00 packs/day for 45 years    Types: Cigarettes    Quit date: 11/11/2003  . Smokeless tobacco: Never Used  . Alcohol Use: No      Comment: occasional     Colonoscopy:  PAP:  Bone density:  Lipid panel:  Allergies  Allergen Reactions  . Evista [Raloxifene] Other (See Comments)    Reaction:  Night sweats   . Fluticasone-Salmeterol Other (See Comments)    Reaction:  Cough     Current Outpatient Prescriptions  Medication Sig Dispense Refill  . albuterol (PROVENTIL HFA;VENTOLIN HFA) 108 (90 BASE) MCG/ACT inhaler Inhale 2 puffs into the lungs every 6 (six) hours as needed for wheezing or shortness of breath. 1 Inhaler 0  . aspirin EC 81 MG tablet Take 81 mg by mouth daily.    . budesonide-formoterol (SYMBICORT) 160-4.5 MCG/ACT inhaler Inhale 2 puffs into the lungs 2 (two) times daily. 1 Inhaler 5  . Calcium Carbonate-Vitamin D (CALCIUM-D) 600-400 MG-UNIT TABS Take 1 tablet by mouth daily.    . fluticasone (FLONASE) 50 MCG/ACT nasal spray Place 2 sprays into the nose daily. 16 g 5  . ipratropium-albuterol (DUONEB) 0.5-2.5 (3) MG/3ML SOLN Take 3 mLs by nebulization every 6 (six) hours as needed (for shortness of breath). 36 mL 0  . lovastatin (MEVACOR) 40 MG tablet Take 40 mg by mouth daily.    . metoprolol tartrate (LOPRESSOR) 25 MG tablet Take 12.5 mg by mouth 2 (two) times daily.    Marland Kitchen triamterene-hydrochlorothiazide (MAXZIDE-25) 37.5-25 MG per tablet Take 1 tablet by mouth  daily 90 tablet 1   No current facility-administered medications for this visit.    OBJECTIVE: BP 187/83 mmHg  Pulse 76  Temp(Src) 97.9 F (36.6 C) (Tympanic)  Resp 20  Ht _0  (1.651 m)  Wt 137 lb 12.6 oz (62.5 kg)  BMI 22.93 kg/m2  SpO2 96%   Body mass index is 22.93 kg/(m^2).    ECOG FS:0 - Asymptomatic  General: Well-developed, well-nourished, no acute distress. Eyes: Pink conjunctiva, anicteric sclera. HEENT: Normocephalic, moist mucous membranes, clear oropharnyx. Lungs: Clear to auscultation bilaterally. Heart: Regular rate and rhythm. No rubs, murmurs, or gallops. Abdomen: Soft, nontender, nondistended. No organomegaly  noted, normoactive bowel sounds. Musculoskeletal: No edema, cyanosis, or clubbing. Neuro: Alert, answering all questions appropriately. Cranial nerves grossly intact. Skin: No rashes or petechiae noted. Psych: Normal affect. Lymphatics: No cervical, calvicular, axillary or inguinal LAD.   LAB RESULTS:  Appointment on 02/03/2015  Component Date Value Ref Range Status  . HCT 02/03/2015 46.6  35.0 - 47.0 % Final    STUDIES: No results found.  ASSESSMENT:  Polycythemia vera  PLAN:   1. Polycythemia vera. EPO level and JAK2 normal. Most recently documented target hematocrit 43-43.5. Today her hematocrit is 46.6. We will perform 250 mL phlebotomy with replacement of 250 mL of IV fluids.  We'll continue with routine follow-up in 6 weeks for lab and phlebotomy only and in 12 weeks patient will have lab, phlebotomy, and provider visit.  Patient expressed understanding and was in agreement with this plan. She also understands that She can call clinic at any time with any questions, concerns, or complaints.   Dr. Oliva Bustard was available for consultation and review of  plan of care for this patient.   Evlyn Kanner, NP   02/03/2015 2:55 PM

## 2015-02-16 ENCOUNTER — Encounter: Payer: Self-pay | Admitting: Urology

## 2015-02-16 ENCOUNTER — Ambulatory Visit (INDEPENDENT_AMBULATORY_CARE_PROVIDER_SITE_OTHER): Payer: Medicare Other | Admitting: Urology

## 2015-02-16 VITALS — BP 190/74 | HR 52 | Resp 16 | Ht 65.0 in | Wt 135.8 lb

## 2015-02-16 DIAGNOSIS — I2510 Atherosclerotic heart disease of native coronary artery without angina pectoris: Secondary | ICD-10-CM

## 2015-02-16 DIAGNOSIS — N281 Cyst of kidney, acquired: Secondary | ICD-10-CM

## 2015-02-16 DIAGNOSIS — Q6100 Congenital renal cyst, unspecified: Secondary | ICD-10-CM

## 2015-02-16 DIAGNOSIS — I10 Essential (primary) hypertension: Secondary | ICD-10-CM | POA: Diagnosis not present

## 2015-02-16 NOTE — Progress Notes (Signed)
02/16/2015 1:54 PM   Tina [name] 07-07-1934 [id number]  Referring provider: Einar Pheasant, MD [address] [place], [place] [address]  Chief Complaint  Patient presents with  . Establish Care    HPI: Patient is a 80 year old white female who is a former patient of Dr. Bjorn Loser who presents today to establish care with a local urologist.  Dr. Jacqlyn Larsen had been following her for bilateral renal cysts.    One of her cysts on the right has increased in size over the years.  She does not report flank pain or gross hematuria.  Her serum creatinine is 1.1 from 01/22/2015.   Her last RUS was on 09/10/2013 and it noted both kidneys are of normal size and shape the right measures 8.9 cm and the left 11.0 cm. No stone, solid mass or hydronephrosis is seen. There are bilateral renal cysts. The right cyst measures approximately 9 mm at maximal diameter. The left simple cyst measures 6.5 x 5.3 x 6.2 cm. The bladder is decompressed.  I cannot view the images.    Her one urinary complaint is nocturia x 2.  She has been having these symptoms for several years and does not find them bothersome.    She did have an elevated BP of 190/74 on today's visit.  She has a hx of HTN.  She is not experiencing any visual changes or headaches, but does report feeling dizzy when she bends over to pick things off the ground.  This started two days ago.    PMH: Past Medical History  Diagnosis Date  . CAD (coronary artery disease)   . Hypertension   . Hypercholesterolemia   . COPD (chronic obstructive pulmonary disease) ([place])   . Osteoporosis   . History of colon polyps   . Hyperglycemia   . Polycythemia vera(238.4)   . Renal cyst   . Asthma   . Emphysema lung ([place])   . GERD (gastroesophageal reflux disease)   . Anemia   . BRCA positive   . Personal history of tobacco use, presenting hazards to health 11/25/2014    Surgical History: Past Surgical History  Procedure Laterality Date  .  Tonsilectomy/adenoidectomy with myringotomy    . Tubal ligation    . Angioplasty      coronary (x1)  . Coronary artery bypass graft    . Coronary angioplasty    . Colon surgery    . Salpingoophorectomy      Home Medications:    Medication List       This list is accurate as of: 02/16/15  1:54 PM.  Always use your most recent med list.               albuterol 108 (90 BASE) MCG/ACT inhaler  Commonly known as:  PROVENTIL HFA;VENTOLIN HFA  Inhale 2 puffs into the lungs every 6 (six) hours as needed for wheezing or shortness of breath.     aspirin EC 81 MG tablet  Take 81 mg by mouth daily.     budesonide-formoterol 160-4.5 MCG/ACT inhaler  Commonly known as:  SYMBICORT  Inhale 2 puffs into the lungs 2 (two) times daily.     Calcium-D 600-400 MG-UNIT Tabs  Take 1 tablet by mouth daily.     fluticasone 50 MCG/ACT nasal spray  Commonly known as:  FLONASE  Place 2 sprays into the nose daily.     ipratropium-albuterol 0.5-2.5 (3) MG/3ML Soln  Commonly known as:  DUONEB  Take 3  mLs by nebulization every 6 (six) hours as needed (for shortness of breath).     lovastatin 40 MG tablet  Commonly known as:  MEVACOR  Take 40 mg by mouth daily.     metoprolol tartrate 25 MG tablet  Commonly known as:  LOPRESSOR  Take 12.5 mg by mouth 2 (two) times daily.     triamterene-hydrochlorothiazide 37.5-25 MG tablet  Commonly known as:  MAXZIDE-25  Take 1 tablet by mouth  daily        Allergies:  Allergies  Allergen Reactions  . Evista [Raloxifene] Other (See Comments)    Reaction:  Night sweats   . Fluticasone-Salmeterol Other (See Comments)    Reaction:  Cough     Family History: Family History  Problem Relation Age of Onset  . Cirrhosis Father     died age 41  . Alcohol abuse Father   . Asthma Mother   . Congestive Heart Failure Mother   . Breast cancer Mother   . Lupus Sister   . Alcohol abuse Sister   . Breast cancer Mother     2 (1/2 sisters)  . Ovarian  cancer Sister   . Osteoporosis Sister   . Osteoarthritis Mother   . Colon cancer Mother   . Skin cancer Sister     Social History:  reports that she quit smoking about 11 years ago. Her smoking use included Cigarettes. She has a 45 pack-year smoking history. She has never used smokeless tobacco. She reports that she does not drink alcohol or use illicit drugs.  ROS: UROLOGY Frequent Urination?: No Hard to postpone urination?: No Burning/pain with urination?: No Get up at night to urinate?: Yes Leakage of urine?: No Urine stream starts and stops?: No Trouble starting stream?: No Do you have to strain to urinate?: No Blood in urine?: No Urinary tract infection?: No Sexually transmitted disease?: No Injury to kidneys or bladder?: No Painful intercourse?: No Weak stream?: No Currently pregnant?: No Vaginal bleeding?: No Last menstrual period?: n/a  Gastrointestinal Nausea?: No Vomiting?: No Indigestion/heartburn?: No Diarrhea?: No Constipation?: No  Constitutional Fever: No Night sweats?: No Weight loss?: No Fatigue?: No  Skin Skin rash/lesions?: No Itching?: No  Eyes Blurred vision?: No Double vision?: No  Ears/Nose/Throat Sore throat?: No Sinus problems?: No  Hematologic/Lymphatic Swollen glands?: No Easy bruising?: Yes  Cardiovascular Leg swelling?: No Chest pain?: No  Respiratory Cough?: Yes Shortness of breath?: Yes  Endocrine Excessive thirst?: No  Musculoskeletal Back pain?: No Joint pain?: No  Neurological Headaches?: No Dizziness?: No  Psychologic Depression?: No Anxiety?: No  Physical Exam: BP 190/74 mmHg  Pulse 52  Resp 16  Ht '5\' 5"'  (1.651 m)  Wt 135 lb 12.8 oz (61.598 kg)  BMI 22.60 kg/m2  Constitutional:  Alert and oriented, No acute distress. HEENT:  AT, moist mucus membranes.  Trachea midline, no masses. Cardiovascular: No clubbing, cyanosis, or edema. Respiratory: Normal respiratory effort, no increased work of  breathing. GI: Abdomen is soft, nontender, nondistended, no abdominal masses GU: No CVA tenderness.  Skin: No rashes, bruises or suspicious lesions. Lymph: No cervical or inguinal adenopathy. Neurologic: Grossly intact, no focal deficits, moving all 4 extremities. Psychiatric: Normal mood and affect.  Laboratory Data: Lab Results  Component Value Date   WBC 7.5 01/22/2015   HGB 15.5 01/22/2015   HCT 46.6 02/03/2015   MCV 81.4 11/15/2014   PLT 301 01/22/2015    Lab Results  Component Value Date   CREATININE 1.1 01/22/2015  Lab Results  Component Value Date   HGBA1C 6.0 01/22/2015    Pertinent Imaging: EXAM INFO:  [id number] UN 09/10/13  09:[id number] [facility]) : [name] RETROPERITONEAL COMPLETE (AO/IVC)  DICTATED: 09/10/13 11:24:08  CLINICAL INDICATION: 80 Year Old (F) with renal cysts.  TECHNIQUE: Grayscale imaging of both kidneys and bladder was performed  COMPARISON: None  FINDINGS: Both kidneys are of normal size and shape the right measures 8.9 cm and the left 11.0 cm. No stone, solid mass or hydronephrosis is seen. There are bilateral renal cysts. The right cyst measures approximately 9 mm at maximal diameter. The left simple cyst measures 6.5 x 5.3 x 6.2 cm. The bladder is decompressed.  INTERPRETATION LOCATION:  [facility]  IMPRESSION:  Bilateral simple cysts.   Assessment & Plan:    1. Renal cyst:   Patient was having her RUS on a yearly basis with Dr. Jacqlyn Larsen.  One of the right renal cysts was increasing in size.  Schedule RUS.  I will call the patient with the results.  I informed her that I will be out of town for 2 weeks.    2. Hypertension:   Patient to contact her PCP today regarding her high BP and postural dizziness.     Return for I will call patient with RUS results.  Zara Council, [facility] [address], [place], [place] [address] [phone]

## 2015-03-02 ENCOUNTER — Ambulatory Visit: Payer: Medicare Other | Admitting: Anesthesiology

## 2015-03-02 ENCOUNTER — Encounter: Payer: Self-pay | Admitting: *Deleted

## 2015-03-02 ENCOUNTER — Ambulatory Visit
Admission: RE | Admit: 2015-03-02 | Discharge: 2015-03-02 | Disposition: A | Discharge status: Home / Self Care | Payer: Medicare Other | Source: Ambulatory Visit | Attending: Gastroenterology | Admitting: Gastroenterology

## 2015-03-02 ENCOUNTER — Encounter
Admission: RE | Disposition: A | Discharge status: Home / Self Care | Payer: Self-pay | Source: Ambulatory Visit | Attending: Gastroenterology

## 2015-03-02 DIAGNOSIS — Z955 Presence of coronary angioplasty implant and graft: Secondary | ICD-10-CM | POA: Insufficient documentation

## 2015-03-02 DIAGNOSIS — Z800 Family history of malignant neoplasm of digestive organs: Secondary | ICD-10-CM | POA: Diagnosis not present

## 2015-03-02 DIAGNOSIS — I10 Essential (primary) hypertension: Secondary | ICD-10-CM | POA: Insufficient documentation

## 2015-03-02 DIAGNOSIS — I2510 Atherosclerotic heart disease of native coronary artery without angina pectoris: Secondary | ICD-10-CM | POA: Diagnosis not present

## 2015-03-02 DIAGNOSIS — K635 Polyp of colon: Secondary | ICD-10-CM | POA: Diagnosis not present

## 2015-03-02 DIAGNOSIS — Z79899 Other long term (current) drug therapy: Secondary | ICD-10-CM | POA: Diagnosis not present

## 2015-03-02 DIAGNOSIS — J45909 Unspecified asthma, uncomplicated: Secondary | ICD-10-CM | POA: Diagnosis not present

## 2015-03-02 DIAGNOSIS — Z1211 Encounter for screening for malignant neoplasm of colon: Secondary | ICD-10-CM | POA: Diagnosis not present

## 2015-03-02 DIAGNOSIS — Z7982 Long term (current) use of aspirin: Secondary | ICD-10-CM | POA: Diagnosis not present

## 2015-03-02 DIAGNOSIS — R1310 Dysphagia, unspecified: Secondary | ICD-10-CM | POA: Insufficient documentation

## 2015-03-02 DIAGNOSIS — Z86010 Personal history of colonic polyps: Secondary | ICD-10-CM | POA: Diagnosis not present

## 2015-03-02 DIAGNOSIS — D125 Benign neoplasm of sigmoid colon: Secondary | ICD-10-CM | POA: Insufficient documentation

## 2015-03-02 DIAGNOSIS — K219 Gastro-esophageal reflux disease without esophagitis: Secondary | ICD-10-CM | POA: Diagnosis not present

## 2015-03-02 DIAGNOSIS — Z7951 Long term (current) use of inhaled steroids: Secondary | ICD-10-CM | POA: Diagnosis not present

## 2015-03-02 DIAGNOSIS — R1013 Epigastric pain: Secondary | ICD-10-CM | POA: Insufficient documentation

## 2015-03-02 DIAGNOSIS — K5730 Diverticulosis of large intestine without perforation or abscess without bleeding: Secondary | ICD-10-CM | POA: Insufficient documentation

## 2015-03-02 DIAGNOSIS — M810 Age-related osteoporosis without current pathological fracture: Secondary | ICD-10-CM | POA: Insufficient documentation

## 2015-03-02 DIAGNOSIS — E7800 Pure hypercholesterolemia, unspecified: Secondary | ICD-10-CM | POA: Insufficient documentation

## 2015-03-02 DIAGNOSIS — K5790 Diverticulosis of intestine, part unspecified, without perforation or abscess without bleeding: Secondary | ICD-10-CM | POA: Diagnosis not present

## 2015-03-02 DIAGNOSIS — Z951 Presence of aortocoronary bypass graft: Secondary | ICD-10-CM | POA: Diagnosis not present

## 2015-03-02 DIAGNOSIS — J439 Emphysema, unspecified: Secondary | ICD-10-CM | POA: Insufficient documentation

## 2015-03-02 DIAGNOSIS — D127 Benign neoplasm of rectosigmoid junction: Secondary | ICD-10-CM | POA: Diagnosis not present

## 2015-03-02 DIAGNOSIS — Z87891 Personal history of nicotine dependence: Secondary | ICD-10-CM | POA: Insufficient documentation

## 2015-03-02 HISTORY — PX: COLONOSCOPY WITH PROPOFOL: SHX5780

## 2015-03-02 LAB — HM COLONOSCOPY: HM Colonoscopy: 3

## 2015-03-02 SURGERY — COLONOSCOPY WITH PROPOFOL
Anesthesia: General

## 2015-03-02 MED ORDER — SODIUM CHLORIDE 0.9 % IV SOLN: INTRAVENOUS | Status: DC

## 2015-03-02 MED ORDER — FENTANYL CITRATE (PF) 100 MCG/2ML IJ SOLN
INTRAMUSCULAR | Status: DC | PRN
  Administered 2015-03-02: 50 ug via INTRAVENOUS

## 2015-03-02 MED ORDER — LIDOCAINE HCL (PF) 2 % IJ SOLN
INTRAMUSCULAR | Status: DC | PRN
Start: 2015-03-02 — End: 2015-03-02
  Administered 2015-03-02: 50 mg

## 2015-03-02 MED ORDER — PROPOFOL 10 MG/ML IV BOLUS: INTRAVENOUS | Status: DC | PRN

## 2015-03-02 MED ORDER — PROPOFOL 10 MG/ML IV BOLUS
INTRAVENOUS | Status: DC | PRN
  Administered 2015-03-02: 30 mg via INTRAVENOUS
  Administered 2015-03-02 (×2): 10 mg via INTRAVENOUS

## 2015-03-02 MED ORDER — SODIUM CHLORIDE 0.9 % IV SOLN
INTRAVENOUS | Status: DC
  Administered 2015-03-02 (×2): via INTRAVENOUS

## 2015-03-02 MED ORDER — PROPOFOL 500 MG/50ML IV EMUL
INTRAVENOUS | Status: DC | PRN
  Administered 2015-03-02: 50 ug/kg/min via INTRAVENOUS

## 2015-03-02 NOTE — Op Note (Signed)
[facility] Gastroenterology Patient Name: Tina [name] Procedure Date: 03/02/2015 10:03 AM MRN: [id number] Account #: [id number] Date of Birth: 1935-02-07 Admit Type: Outpatient Age: 80 Room: [facility] ENDO ROOM 3 Gender: Female Note Status: Finalized Procedure:         Colonoscopy Indications:       Family history of colon cancer in a first-degree relative,                     Personal history of colonic polyps Providers:         Lollie Sails, MD Referring MD:      Einar Pheasant, MD (Referring MD) Medicines:         Monitored Anesthesia Care Complications:     No immediate complications. Procedure:         Pre-Anesthesia Assessment:                    - ASA Grade Assessment: III - A patient with severe                     systemic disease.                    After obtaining informed consent, the colonoscope was                     passed under direct vision. Throughout the procedure, the                     patient's blood pressure, pulse, and oxygen saturations                     were monitored continuously. The Colonoscope was                     introduced through the anus and advanced to the the cecum,                     identified by appendiceal orifice and ileocecal valve. The                     colonoscopy was performed with moderate difficulty due to                     multiple diverticula in the colon and a tortuous colon.                     Successful completion of the procedure was aided by                     changing the patient to a prone position. The patient                     tolerated the procedure well. The quality of the bowel                     preparation was good. Findings:      A 5 mm polyp was found in the mid sigmoid colon. The polyp was       semi-pedunculated. The polyp was removed with a hot snare. The polyp was       removed with a cold snare. Resection and retrieval were complete.       Biopsies were taken with a cold  forceps for histology of the fold  at the       base of the polyp..      A 4 mm polyp was found in the proximal sigmoid colon. The polyp was       sessile. The polyp was removed with a cold snare. Resection and       retrieval were complete.      A 3 mm polyp was found in the recto-sigmoid colon. The polyp was       sessile. The polyp was removed with a cold snare. Resection and       retrieval were complete.      Many small and large-mouthed diverticula were found in the sigmoid colon       and in the descending colon.      The retroflexed view of the distal rectum and anal verge was normal and       showed no anal or rectal abnormalities.      The digital rectal exam was normal. Impression:        - One 5 mm polyp in the mid sigmoid colon. Resected and                     retrieved. Biopsied.                    - One 4 mm polyp in the proximal sigmoid colon. Resected                     and retrieved.                    - One 3 mm polyp at the recto-sigmoid colon. Resected and                     retrieved.                    - Diverticulosis in the sigmoid colon and in the                     descending colon.                    - The distal rectum and anal verge are normal on                     retroflexion view. Recommendation:    - Await pathology results.                    - Telephone GI clinic for pathology results in 1 week. Procedure Code(s): --- Professional ---                    [id number], Colonoscopy, flexible; with removal of tumor(s),                     polyp(s), or other lesion(s) by snare technique Diagnosis Code(s): --- Professional ---                    211.3, Benign neoplasm of colon                    211.4, Benign neoplasm of rectum and anal canal                    V16.0, Family history of malignant neoplasm of  gastrointestinal tract                    V12.72, Personal history of colonic polyps                    562.10, Diverticulosis of  colon (without mention of                     hemorrhage) CPT copyright 2014 American Medical Association. All rights reserved. The codes documented in this report are preliminary and upon coder review may  be revised to meet current compliance requirements. Lollie Sails, MD 03/02/2015 10:41:52 AM This report has been signed electronically. Number of Addenda: 0 Note Initiated On: 03/02/2015 10:03 AM Scope Withdrawal Time: 0 hours 9 minutes 13 seconds  Total Procedure Duration: 0 hours 22 minutes 53 seconds       [facility]

## 2015-03-02 NOTE — Anesthesia Postprocedure Evaluation (Signed)
  Anesthesia Post-op Note  Patient: Tina [name]  Procedure(s) Performed: Procedure(s): COLONOSCOPY WITH PROPOFOL (N/A)  Anesthesia type:General  Patient location: PACU  Post pain: Pain level controlled  Post assessment: Post-op Vital signs reviewed, Patient's Cardiovascular Status Stable, Respiratory Function Stable, Patent Airway and No signs of Nausea or vomiting  Post vital signs: Reviewed and stable  Last Vitals:  Filed Vitals:   03/02/15 1100  BP: 159/72  Pulse: 56  Temp:   Resp: 14    Level of consciousness: awake, alert  and patient cooperative  Complications: No apparent anesthesia complications

## 2015-03-02 NOTE — Transfer of Care (Signed)
Immediate Anesthesia Transfer of Care Note  Patient: Tina [name]  Procedure(s) Performed: Procedure(s): COLONOSCOPY WITH PROPOFOL (N/A)  Patient Location: PACU    Anesthesia Type:General  Level of Consciousness: sedated  Airway & Oxygen Therapy: Patient Spontanous Breathing and Patient connected to nasal cannula oxygen  Post-op Assessment: Report given to RN and Post -op Vital signs reviewed and stable  Post vital signs: Reviewed and stable  Last Vitals:  Filed Vitals:   03/02/15 1042  BP:   Pulse: 59  Temp: 36.1 C  Resp: 18    Complications: No apparent anesthesia complications

## 2015-03-02 NOTE — Anesthesia Preprocedure Evaluation (Signed)
Anesthesia Evaluation  Patient identified by MRN, date of birth, ID band Patient awake    Reviewed: Allergy & Precautions, H&P , NPO status , Patient's Chart, lab work & pertinent test results  Airway Mallampati: III  TM Distance: >3 FB Neck ROM: limited    Dental  (+) Poor Dentition, Chipped, Missing   Pulmonary neg shortness of breath, asthma , COPD, former smoker,    Pulmonary exam normal breath sounds clear to auscultation       Cardiovascular Exercise Tolerance: Good hypertension, (-) angina+ CAD and + Cardiac Stents  (-) DOE Normal cardiovascular exam Rhythm:regular Rate:Normal     Neuro/Psych PSYCHIATRIC DISORDERS negative neurological ROS     GI/Hepatic Neg liver ROS, GERD  Controlled,  Endo/Other  negative endocrine ROS  Renal/GU Renal disease  negative genitourinary   Musculoskeletal   Abdominal   Peds  Hematology negative hematology ROS (+)   Anesthesia Other Findings Past Medical History:   CAD (coronary artery disease)                                Hypertension                                                 Hypercholesterolemia                                         COPD (chronic obstructive pulmonary disease) (*              Osteoporosis                                                 History of colon polyps                                      Hyperglycemia                                                Polycythemia vera(238.4)                                     Renal cyst                                                   Asthma                                                       Emphysema lung ([facility])  GERD (gastroesophageal reflux disease)                       Anemia                                                       BRCA positive                                                Personal history of tobacco use, presenting ha* 11/25/2014   Past  Surgical History:   TONSILECTOMY/ADENOIDECTOMY WITH MYRINGOTOMY                   TUBAL LIGATION                                                ANGIOPLASTY                                                     Comment:coronary (x1)   CORONARY ARTERY BYPASS GRAFT                                  CORONARY ANGIOPLASTY                                          COLON SURGERY                                                 SALPINGOOPHORECTOMY                                          BMI    Body Mass Index   22.46 kg/m 2    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  Reproductive/Obstetrics negative OB ROS                             Anesthesia Physical Anesthesia Plan  ASA: III  Anesthesia Plan: General   Post-op Pain Management:    Induction:   Airway Management Planned:   Additional Equipment:   Intra-op Plan:   Post-operative Plan:   Informed Consent: I have reviewed the patients History and Physical, chart, labs and discussed the procedure including the risks, benefits and alternatives for the proposed anesthesia with the patient or authorized representative who has indicated his/her understanding and acceptance.   Dental Advisory Given  Plan Discussed  with: Anesthesiologist, CRNA and Surgeon  Anesthesia Plan Comments:         Anesthesia Quick Evaluation

## 2015-03-02 NOTE — H&P (Signed)
Outpatient short stay form Pre-procedure 03/02/2015 9:01 AM Lollie Sails MD  Primary Physician: Dr. Einar Pheasant  Reason for visit:  Colonoscopy  History of present illness:  Patient is a 80 year old female presenting today with for colonoscopy in regards her personal history of adenomatous colon polyps. There is also some indication that she has a family history of colon cancer in her mother.    Current facility-administered medications:  .  0.9 %  sodium chloride infusion, , Intravenous, Continuous, Lollie Sails, MD  Prescriptions prior to admission  Medication Sig Dispense Refill Last Dose  . aspirin EC 81 MG tablet Take 81 mg by mouth daily.   Past Week at Unknown time  . budesonide-formoterol (SYMBICORT) 160-4.5 MCG/ACT inhaler Inhale 2 puffs into the lungs 2 (two) times daily. 1 Inhaler 5 03/02/2015 at Unknown time  . ipratropium-albuterol (DUONEB) 0.5-2.5 (3) MG/3ML SOLN Take 3 mLs by nebulization every 6 (six) hours as needed (for shortness of breath). 36 mL 0 03/02/2015 at Unknown time  . metoprolol tartrate (LOPRESSOR) 25 MG tablet Take 12.5 mg by mouth 2 (two) times daily.   03/02/2015 at 0430  . triamterene-hydrochlorothiazide (MAXZIDE-25) 37.5-25 MG per tablet Take 1 tablet by mouth  daily 90 tablet 1 03/02/2015 at 0430  . albuterol (PROVENTIL HFA;VENTOLIN HFA) 108 (90 BASE) MCG/ACT inhaler Inhale 2 puffs into the lungs every 6 (six) hours as needed for wheezing or shortness of breath. 1 Inhaler 0 Taking  . Calcium Carbonate-Vitamin D (CALCIUM-D) 600-400 MG-UNIT TABS Take 1 tablet by mouth daily.   Taking  . fluticasone (FLONASE) 50 MCG/ACT nasal spray Place 2 sprays into the nose daily. 16 g 5 Taking  . lovastatin (MEVACOR) 40 MG tablet Take 40 mg by mouth daily.   Taking     Allergies  Allergen Reactions  . Evista [Raloxifene] Other (See Comments)    Reaction:  Night sweats   . Fluticasone-Salmeterol Other (See Comments)    Reaction:  Cough      Past  Medical History  Diagnosis Date  . CAD (coronary artery disease)   . Hypertension   . Hypercholesterolemia   . COPD (chronic obstructive pulmonary disease) ([place])   . Osteoporosis   . History of colon polyps   . Hyperglycemia   . Polycythemia vera(238.4)   . Renal cyst   . Asthma   . Emphysema lung ([place])   . GERD (gastroesophageal reflux disease)   . Anemia   . BRCA positive   . Personal history of tobacco use, presenting hazards to health 11/25/2014    Review of systems:      Physical Exam    Heart and lungs: Regular rate and rhythm without rub or gallop, lungs are bilaterally clear    HEENT: Normocephalic atraumatic eyes are anicteric    Other:     Pertinant exam for procedure: Soft nontender nondistended bowel sounds positive normoactive    Planned proceedures: Colonoscopy and indicated procedures I have discussed the risks benefits and complications of procedures to include not limited to bleeding, infection, perforation and the risk of sedation and the patient wishes to proceed.    Lollie Sails, MD Gastroenterology 03/02/2015  9:01 AM

## 2015-03-02 NOTE — H&P (Signed)
Outpatient short stay form Pre-procedure 03/02/2015 10:51 AM Tina Sails MD  Primary Physician: Dr Ellin Goodie  Reason for visit:  EGD and colonoscopy  History of present illness:  Dyspepsia, dysphagia, personal history of adenomatous colon polyps. Patient is a 80 year old female presenting for further evaluation and colonoscopy. She states that she occasionally gets strength "strangled when she eats". 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 She does not regurgitate foods. She does take pantoprazole daily which gives her good control of GERD symptoms. He has a personal history of adenomatous colon polyps. She tolerated her prep well. She takes no blood thinning agents or aspirin products.    Current facility-administered medications:  .  0.9 %  sodium chloride infusion, , Intravenous, Continuous, Tina Sails, MD, Last Rate: 20 mL/hr at 03/02/15 0917 .  0.9 %  sodium chloride infusion, , Intravenous, Continuous, Tina Sails, MD  Prescriptions prior to admission  Medication Sig Dispense Refill Last Dose  . aspirin EC 81 MG tablet Take 81 mg by mouth daily.   Past Week at Unknown time  . budesonide-formoterol (SYMBICORT) 160-4.5 MCG/ACT inhaler Inhale 2 puffs into the lungs 2 (two) times daily. 1 Inhaler 5 03/02/2015 at Unknown time  . ipratropium-albuterol (DUONEB) 0.5-2.5 (3) MG/3ML SOLN Take 3 mLs by nebulization every 6 (six) hours as needed (for shortness of breath). 36 mL 0 03/02/2015 at Unknown time  . metoprolol tartrate (LOPRESSOR) 25 MG tablet Take 12.5 mg by mouth 2 (two) times daily.   03/02/2015 at 0430  . triamterene-hydrochlorothiazide (MAXZIDE-25) 37.5-25 MG per tablet Take 1 tablet by mouth  daily 90 tablet 1 03/02/2015 at 0430  . albuterol (PROVENTIL HFA;VENTOLIN HFA) 108 (90 BASE) MCG/ACT inhaler Inhale 2 puffs into the lungs every 6 (six) hours as needed for wheezing or shortness of breath. 1 Inhaler 0 Taking  . Calcium Carbonate-Vitamin D  (CALCIUM-D) 600-400 MG-UNIT TABS Take 1 tablet by mouth daily.   Taking  . fluticasone (FLONASE) 50 MCG/ACT nasal spray Place 2 sprays into the nose daily. 16 g 5 Taking  . lovastatin (MEVACOR) 40 MG tablet Take 40 mg by mouth daily.   Taking     Allergies  Allergen Reactions  . Evista [Raloxifene] Other (See Comments)    Reaction:  Night sweats   . Fluticasone-Salmeterol Other (See Comments)    Reaction:  Cough      Past Medical History  Diagnosis Date  . CAD (coronary artery disease)   . Hypertension   . Hypercholesterolemia   . COPD (chronic obstructive pulmonary disease) ([place])   . Osteoporosis   . History of colon polyps   . Hyperglycemia   . Polycythemia vera(238.4)   . Renal cyst   . Asthma   . Emphysema lung ([place])   . GERD (gastroesophageal reflux disease)   . Anemia   . BRCA positive   . Personal history of tobacco use, presenting hazards to health 11/25/2014    Review of systems:      Physical Exam    Heart and lungs: Regular rate and rhythm without rub or gallop lungs are bilaterally clear    HEENT: Normocephalic atraumatic eyes are anicteric    Other:     Pertinant exam for procedure: Soft nontender nondistended bowel sounds positive normoactive    Planned proceedures: EGD and colonoscopy with indicated procedures I have discussed the risks benefits and complications of procedures to include not limited to bleeding, infection, perforation and the risk of sedation and the patient  wishes to proceed.    Tina Sails, MD Gastroenterology 03/02/2015  10:51 AM

## 2015-03-03 ENCOUNTER — Encounter: Payer: Self-pay | Admitting: Gastroenterology

## 2015-03-03 LAB — SURGICAL PATHOLOGY

## 2015-03-05 ENCOUNTER — Other Ambulatory Visit: Payer: Self-pay | Admitting: Internal Medicine

## 2015-03-10 ENCOUNTER — Ambulatory Visit
Admission: RE | Admit: 2015-03-10 | Discharge: 2015-03-10 | Disposition: A | Discharge status: Home / Self Care | Payer: Medicare Other | Source: Ambulatory Visit | Attending: Urology | Admitting: Urology

## 2015-03-10 ENCOUNTER — Other Ambulatory Visit: Payer: Self-pay | Admitting: *Deleted

## 2015-03-10 DIAGNOSIS — N281 Cyst of kidney, acquired: Secondary | ICD-10-CM

## 2015-03-10 DIAGNOSIS — Q6100 Congenital renal cyst, unspecified: Secondary | ICD-10-CM | POA: Diagnosis not present

## 2015-03-10 MED ORDER — LOVASTATIN 40 MG PO TABS: 40.0000 mg | ORAL_TABLET | Freq: Every day | ORAL | Status: DC

## 2015-03-16 ENCOUNTER — Telehealth: Payer: Self-pay

## 2015-03-16 NOTE — Telephone Encounter (Signed)
-----   Message from Nori Riis, PA-C sent at 03/15/2015 11:41 AM EDT ----- Patient's cyst has increased in size.  No further work up is recommended by the AUA guidelines.

## 2015-03-16 NOTE — Telephone Encounter (Signed)
Left message with sister for pt to give [name] a call back.

## 2015-03-17 ENCOUNTER — Encounter: Payer: Self-pay | Admitting: Internal Medicine

## 2015-03-17 ENCOUNTER — Inpatient Hospital Stay: Payer: Medicare Other | Attending: Internal Medicine

## 2015-03-17 ENCOUNTER — Inpatient Hospital Stay: Payer: Medicare Other

## 2015-03-17 VITALS — BP 140/68 | HR 64 | Resp 18

## 2015-03-17 DIAGNOSIS — D45 Polycythemia vera: Secondary | ICD-10-CM | POA: Insufficient documentation

## 2015-03-17 LAB — HEMOGLOBIN AND HEMATOCRIT, BLOOD
HCT: 45.8 % (ref 35.0–47.0)
Hemoglobin: 15.2 g/dL (ref 12.0–16.0)

## 2015-03-17 MED ORDER — SODIUM CHLORIDE 0.9 % IV SOLN
Freq: Once | INTRAVENOUS | Status: DC
  Filled 2015-03-17: qty 1000

## 2015-03-17 NOTE — Telephone Encounter (Signed)
Line busy

## 2015-03-18 DIAGNOSIS — D45 Polycythemia vera: Secondary | ICD-10-CM | POA: Diagnosis not present

## 2015-03-18 DIAGNOSIS — J432 Centrilobular emphysema: Secondary | ICD-10-CM | POA: Diagnosis not present

## 2015-03-18 NOTE — Telephone Encounter (Signed)
Spoke with pt in reference to cyst size increasing. Pt voiced understanding.

## 2015-03-25 ENCOUNTER — Encounter: Payer: Self-pay | Admitting: Internal Medicine

## 2015-03-25 ENCOUNTER — Ambulatory Visit (INDEPENDENT_AMBULATORY_CARE_PROVIDER_SITE_OTHER): Payer: Medicare Other | Admitting: Internal Medicine

## 2015-03-25 VITALS — BP 130/80 | HR 61 | Temp 98.1°F | Resp 18 | Ht 65.0 in | Wt 136.0 lb

## 2015-03-25 DIAGNOSIS — R739 Hyperglycemia, unspecified: Secondary | ICD-10-CM

## 2015-03-25 DIAGNOSIS — N281 Cyst of kidney, acquired: Secondary | ICD-10-CM

## 2015-03-25 DIAGNOSIS — Q6100 Congenital renal cyst, unspecified: Secondary | ICD-10-CM

## 2015-03-25 DIAGNOSIS — I2510 Atherosclerotic heart disease of native coronary artery without angina pectoris: Secondary | ICD-10-CM

## 2015-03-25 DIAGNOSIS — Z1239 Encounter for other screening for malignant neoplasm of breast: Secondary | ICD-10-CM | POA: Diagnosis not present

## 2015-03-25 DIAGNOSIS — Z860100 Personal history of colon polyps, unspecified: Secondary | ICD-10-CM

## 2015-03-25 DIAGNOSIS — J449 Chronic obstructive pulmonary disease, unspecified: Secondary | ICD-10-CM

## 2015-03-25 DIAGNOSIS — E7800 Pure hypercholesterolemia, unspecified: Secondary | ICD-10-CM

## 2015-03-25 DIAGNOSIS — I10 Essential (primary) hypertension: Secondary | ICD-10-CM | POA: Diagnosis not present

## 2015-03-25 DIAGNOSIS — D45 Polycythemia vera: Secondary | ICD-10-CM

## 2015-03-25 NOTE — Progress Notes (Signed)
Pre-visit discussion using our clinic review tool. No additional management support is needed unless otherwise documented below in the visit note.  

## 2015-03-25 NOTE — Progress Notes (Signed)
[name], female   DOB: June 07, 1934, 80 y.o.   MRN: [id number]   Subjective:    [name], female    DOB: 29-Dec-1934, 80 y.o.   MRN: [id number]  HPI  Patient with past history of CAD, hypertension, COPD, GERD, hyperglycemia and hypercholesterolemia.  She comes in today to follow up on these issues.  Is followed by Dr Raul Del.  Feels her breathing is stable.  No increased cough or congestion.  No chest pain or tightness.  No acid reflux.  No abdominal pain or cramping.  Bowels stable.  Increased stress taking care of her sister.  She is still dancing - line dancing.  Doe snot feel she needs any further intervention at this time.  Feels handling stress relatively well.  Saw urology.  Felt no further w/up warranted.     Past Medical History  Diagnosis Date  . CAD (coronary artery disease)   . Hypertension   . Hypercholesterolemia   . COPD (chronic obstructive pulmonary disease) ([place])   . Osteoporosis   . History of colon polyps   . Hyperglycemia   . Polycythemia vera(238.4)   . Renal cyst   . Asthma   . Emphysema lung ([place])   . GERD (gastroesophageal reflux disease)   . Anemia   . BRCA positive   . Personal history of tobacco use, presenting hazards to health 11/25/2014   Past Surgical History  Procedure Laterality Date  . Tonsilectomy/adenoidectomy with myringotomy    . Tubal ligation    . Angioplasty      coronary (x1)  . Coronary artery bypass graft    . Coronary angioplasty    . Colon surgery    . Salpingoophorectomy    . Colonoscopy with propofol N/A 03/02/2015    Procedure: COLONOSCOPY WITH PROPOFOL;  Surgeon: Lollie Sails, MD;  Location: [facility] ENDOSCOPY;  Service: Endoscopy;  Laterality: N/A;   Family History  Problem Relation Age of Onset  . Cirrhosis Father     died age 22  . Alcohol abuse Father   . Asthma Mother   . Congestive Heart Failure Mother   . Breast cancer Mother   . Lupus Sister   . Alcohol abuse Sister   . Breast  cancer Mother     2 (1/2 sisters)  . Ovarian cancer Sister   . Osteoporosis Sister   . Osteoarthritis Mother   . Colon cancer Mother   . Skin cancer Sister    Social History   Social History  . Marital Status: Divorced    Spouse Name: N/A  . Number of Children: 2  . Years of Education: N/A   Social History Main Topics  . Smoking status: Former Smoker -- 1.00 packs/day for 45 years    Types: Cigarettes    Quit date: 11/11/2003  . Smokeless tobacco: Never Used  . Alcohol Use: No     Comment: occasional  . Drug Use: No  . Sexual Activity: Not Asked   Other Topics Concern  . None   Social History Narrative    Outpatient Encounter Prescriptions as of 03/25/2015  Medication Sig  . albuterol (PROVENTIL HFA;VENTOLIN HFA) 108 (90 BASE) MCG/ACT inhaler Inhale 2 puffs into the lungs every 6 (six) hours as needed for wheezing or shortness of breath.  Marland Kitchen aspirin EC 81 MG tablet Take 81 mg by mouth daily.  . Calcium Carbonate-Vitamin D (CALCIUM-D) 600-400 MG-UNIT TABS Take 1 tablet by mouth daily.  . fluticasone (FLONASE)  50 MCG/ACT nasal spray Place 2 sprays into the nose daily.  Marland Kitchen ipratropium-albuterol (DUONEB) 0.5-2.5 (3) MG/3ML SOLN Take 3 mLs by nebulization every 6 (six) hours as needed (for shortness of breath).  . lovastatin (MEVACOR) 40 MG tablet Take 1 tablet (40 mg total) by mouth daily.  . metoprolol tartrate (LOPRESSOR) 25 MG tablet Take 12.5 mg by mouth 2 (two) times daily.  . SYMBICORT 160-4.5 MCG/ACT inhaler Use 2 puffs two times daily  . triamterene-hydrochlorothiazide (MAXZIDE-25) 37.5-25 MG per tablet Take 1 tablet by mouth  daily   No facility-administered encounter medications on file as of 03/25/2015.    Review of Systems  Constitutional: Negative for appetite change and unexpected weight change.  HENT: Negative for congestion and sinus pressure.   Eyes: Negative for pain and discharge.  Respiratory: Negative for cough and chest tightness.        Breathing  stable.   Cardiovascular: Negative for chest pain, palpitations and leg swelling.  Gastrointestinal: Negative for nausea, vomiting, abdominal pain and diarrhea.  Genitourinary: Negative for dysuria and difficulty urinating.  Musculoskeletal: Negative for back pain and joint swelling.  Skin: Negative for color change and rash.  Neurological: Negative for dizziness, light-headedness and headaches.  Psychiatric/Behavioral: Negative for dysphoric mood and agitation.       Objective:    Physical Exam  Constitutional: She appears well-developed and well-nourished. No distress.  HENT:  Nose: Nose normal.  Mouth/Throat: Oropharynx is clear and moist.  Eyes: Conjunctivae are normal. Right eye exhibits no discharge. Left eye exhibits no discharge.  Neck: Neck supple. No thyromegaly present.  Cardiovascular: Normal rate and regular rhythm.   Pulmonary/Chest: Breath sounds normal. No respiratory distress. She has no wheezes.  Abdominal: Soft. Bowel sounds are normal. There is no tenderness.  Musculoskeletal: She exhibits no edema or tenderness.  Lymphadenopathy:    She has no cervical adenopathy.  Skin: No rash noted. No erythema.  Psychiatric: She has a normal mood and affect. Her behavior is normal.    BP 130/80 mmHg  Pulse 61  Temp(Src) 98.1 F (36.7 C) (Oral)  Resp 18  Ht $R'5\' 5"'kK$  (1.651 m)  Wt 136 lb (61.689 kg)  BMI 22.63 kg/m2  SpO2 98% Wt Readings from Last 3 Encounters:  03/25/15 136 lb (61.689 kg)  03/02/15 135 lb (61.236 kg)  02/16/15 135 lb 12.8 oz (61.598 kg)     Lab Results  Component Value Date   WBC 7.5 01/22/2015   HGB 15.2 03/17/2015   HCT 45.8 03/17/2015   PLT 301 01/22/2015   GLUCOSE 198* 11/15/2014   CHOL 166 01/22/2015   TRIG 84 01/22/2015   HDL 61 01/22/2015   LDLCALC 88 01/22/2015   ALT 29 11/13/2014   AST 37 11/13/2014   NA 143 01/22/2015   K 4.3 01/22/2015   CL 97* 11/15/2014   CREATININE 1.1 01/22/2015   BUN 17 01/22/2015   CO2 27 11/15/2014    TSH 2.16 08/11/2014   HGBA1C 6.0 01/22/2015    US Renal  03/10/2015  CLINICAL DATA:  Follow-up of a left renal cyst EXAM: RENAL / URINARY TRACT ULTRASOUND COMPLETE COMPARISON:  Abdominal ultrasound of July 20, 2010 FINDINGS: Right Kidney: Length: 9.6 cm. Echogenicity within normal limits. No mass or hydronephrosis visualized. Left Kidney: Length: 10.7 cm. There is a exophytic lower pole cyst laterally measuring 7 x 6 x 6.4 cm. This has increased in size since the previous study. The echotexture of the renal parenchyma is normal. There is no hydronephrosis.  Bladder: Normal for degree of distention. IMPRESSION: Interval enlargement of the exophytic left lower pole simple appearing renal cyst with maximal measured diameter of 7 cm. Previously the maximal reported dimension was 5.8 cm. The right kidney and urinary bladder are unremarkable. Electronically Signed   By: David  Martinique M.D.   On: 03/10/2015 11:20       Assessment & Plan:   Problem List Items Addressed This Visit    CAD (coronary artery disease)    Followed by Dr Saralyn Pilar.  Stable.  Continue risk factor modification.       COPD (chronic obstructive pulmonary disease) (HCC)    Breathing stable.  Followed by Dr Raul Del.       Essential hypertension    Blood pressure under good control.  Continue same medication regimen.  Follow pressures.  Follow metabolic panel.        Relevant Orders   Basic metabolic panel   History of colonic polyps    Colonoscopy 03/02/15 - three polyps, diverticulosis - hyperplastic.        Hypercholesterolemia    On lovastatin.  Low cholesterol diet and exercise.  Follow lipid panel and liver function tests.        Relevant Orders   Hepatic function panel   Lipid panel   Hyperglycemia    Low carb diet and exercise.  Follow met b and a1c.       Relevant Orders   Hemoglobin A1c   Polycythemia vera ([place])    Sees hematology.  S/p phlebotomy 03/17/15.        Renal cyst    Saw urology  Zara Council).  Felt no further w/up warranted.  See urology's note.        Other Visit Diagnoses    Screening breast examination    -  Primary    Relevant Orders    MM DIGITAL SCREENING BILATERAL        Einar Pheasant, MD

## 2015-03-28 ENCOUNTER — Encounter: Payer: Self-pay | Admitting: Internal Medicine

## 2015-03-28 NOTE — Assessment & Plan Note (Signed)
Blood pressure under good control.  Continue same medication regimen.  Follow pressures.  Follow metabolic panel.   

## 2015-03-28 NOTE — Assessment & Plan Note (Signed)
Saw urology Zara Council).  Felt no further w/up warranted.  See urology's note.

## 2015-03-28 NOTE — Assessment & Plan Note (Signed)
On lovastatin.  Low cholesterol diet and exercise.  Follow lipid panel and liver function tests.   

## 2015-03-28 NOTE — Assessment & Plan Note (Signed)
Followed by Dr Paraschos.  Stable.  Continue risk factor modification.  

## 2015-03-28 NOTE — Assessment & Plan Note (Signed)
Colonoscopy 03/02/15 - three polyps, diverticulosis - hyperplastic.

## 2015-03-28 NOTE — Assessment & Plan Note (Signed)
Breathing stable.  Followed by Dr Raul Del.

## 2015-03-28 NOTE — Assessment & Plan Note (Signed)
Low carb diet and exercise.  Follow met b and a1c.  

## 2015-03-28 NOTE — Assessment & Plan Note (Signed)
Sees hematology.  S/p phlebotomy 03/17/15.

## 2015-04-13 ENCOUNTER — Ambulatory Visit
Admission: RE | Admit: 2015-04-13 | Discharge: 2015-04-13 | Disposition: A | Discharge status: Home / Self Care | Payer: Medicare Other | Source: Ambulatory Visit | Attending: Internal Medicine | Admitting: Internal Medicine

## 2015-04-13 ENCOUNTER — Other Ambulatory Visit: Payer: Self-pay | Admitting: Internal Medicine

## 2015-04-13 DIAGNOSIS — Z1231 Encounter for screening mammogram for malignant neoplasm of breast: Secondary | ICD-10-CM | POA: Diagnosis not present

## 2015-04-13 DIAGNOSIS — M8580 Other specified disorders of bone density and structure, unspecified site: Secondary | ICD-10-CM | POA: Diagnosis present

## 2015-04-13 DIAGNOSIS — E2839 Other primary ovarian failure: Secondary | ICD-10-CM | POA: Insufficient documentation

## 2015-04-13 DIAGNOSIS — M8588 Other specified disorders of bone density and structure, other site: Secondary | ICD-10-CM | POA: Insufficient documentation

## 2015-04-13 DIAGNOSIS — M810 Age-related osteoporosis without current pathological fracture: Secondary | ICD-10-CM

## 2015-04-13 DIAGNOSIS — Z780 Asymptomatic menopausal state: Secondary | ICD-10-CM | POA: Diagnosis not present

## 2015-04-13 DIAGNOSIS — R922 Inconclusive mammogram: Secondary | ICD-10-CM | POA: Insufficient documentation

## 2015-04-13 DIAGNOSIS — Z1239 Encounter for other screening for malignant neoplasm of breast: Secondary | ICD-10-CM

## 2015-04-13 DIAGNOSIS — N63 Unspecified lump in unspecified breast: Secondary | ICD-10-CM

## 2015-04-13 DIAGNOSIS — J45909 Unspecified asthma, uncomplicated: Secondary | ICD-10-CM | POA: Diagnosis not present

## 2015-04-13 DIAGNOSIS — M85851 Other specified disorders of bone density and structure, right thigh: Secondary | ICD-10-CM | POA: Diagnosis not present

## 2015-04-13 HISTORY — DX: Unspecified malignant neoplasm of skin, unspecified: C44.90

## 2015-04-23 ENCOUNTER — Other Ambulatory Visit: Payer: Self-pay | Admitting: *Deleted

## 2015-04-23 DIAGNOSIS — D45 Polycythemia vera: Secondary | ICD-10-CM

## 2015-04-28 ENCOUNTER — Encounter: Payer: Self-pay | Admitting: Internal Medicine

## 2015-04-28 ENCOUNTER — Inpatient Hospital Stay (HOSPITAL_BASED_OUTPATIENT_CLINIC_OR_DEPARTMENT_OTHER): Payer: Medicare Other | Admitting: Internal Medicine

## 2015-04-28 ENCOUNTER — Inpatient Hospital Stay: Payer: Medicare Other

## 2015-04-28 ENCOUNTER — Inpatient Hospital Stay: Payer: Medicare Other | Attending: Internal Medicine

## 2015-04-28 VITALS — BP 178/76 | HR 72 | Temp 97.6°F | Wt 138.0 lb

## 2015-04-28 DIAGNOSIS — Z8041 Family history of malignant neoplasm of ovary: Secondary | ICD-10-CM | POA: Insufficient documentation

## 2015-04-28 DIAGNOSIS — Z85828 Personal history of other malignant neoplasm of skin: Secondary | ICD-10-CM | POA: Diagnosis not present

## 2015-04-28 DIAGNOSIS — N281 Cyst of kidney, acquired: Secondary | ICD-10-CM | POA: Diagnosis not present

## 2015-04-28 DIAGNOSIS — M818 Other osteoporosis without current pathological fracture: Secondary | ICD-10-CM | POA: Insufficient documentation

## 2015-04-28 DIAGNOSIS — J439 Emphysema, unspecified: Secondary | ICD-10-CM

## 2015-04-28 DIAGNOSIS — Z803 Family history of malignant neoplasm of breast: Secondary | ICD-10-CM | POA: Insufficient documentation

## 2015-04-28 DIAGNOSIS — Z800 Family history of malignant neoplasm of digestive organs: Secondary | ICD-10-CM

## 2015-04-28 DIAGNOSIS — K219 Gastro-esophageal reflux disease without esophagitis: Secondary | ICD-10-CM | POA: Diagnosis not present

## 2015-04-28 DIAGNOSIS — I10 Essential (primary) hypertension: Secondary | ICD-10-CM | POA: Diagnosis not present

## 2015-04-28 DIAGNOSIS — Z7982 Long term (current) use of aspirin: Secondary | ICD-10-CM | POA: Diagnosis not present

## 2015-04-28 DIAGNOSIS — Z808 Family history of malignant neoplasm of other organs or systems: Secondary | ICD-10-CM | POA: Diagnosis not present

## 2015-04-28 DIAGNOSIS — R928 Other abnormal and inconclusive findings on diagnostic imaging of breast: Secondary | ICD-10-CM | POA: Insufficient documentation

## 2015-04-28 DIAGNOSIS — Z1501 Genetic susceptibility to malignant neoplasm of breast: Secondary | ICD-10-CM

## 2015-04-28 DIAGNOSIS — R739 Hyperglycemia, unspecified: Secondary | ICD-10-CM | POA: Diagnosis not present

## 2015-04-28 DIAGNOSIS — Z87891 Personal history of nicotine dependence: Secondary | ICD-10-CM | POA: Diagnosis not present

## 2015-04-28 DIAGNOSIS — Z79899 Other long term (current) drug therapy: Secondary | ICD-10-CM | POA: Diagnosis not present

## 2015-04-28 DIAGNOSIS — E7800 Pure hypercholesterolemia, unspecified: Secondary | ICD-10-CM

## 2015-04-28 DIAGNOSIS — D751 Secondary polycythemia: Secondary | ICD-10-CM

## 2015-04-28 DIAGNOSIS — Z86010 Personal history of colonic polyps: Secondary | ICD-10-CM | POA: Diagnosis not present

## 2015-04-28 DIAGNOSIS — J45909 Unspecified asthma, uncomplicated: Secondary | ICD-10-CM | POA: Diagnosis not present

## 2015-04-28 DIAGNOSIS — J449 Chronic obstructive pulmonary disease, unspecified: Secondary | ICD-10-CM | POA: Insufficient documentation

## 2015-04-28 DIAGNOSIS — I2510 Atherosclerotic heart disease of native coronary artery without angina pectoris: Secondary | ICD-10-CM

## 2015-04-28 DIAGNOSIS — R918 Other nonspecific abnormal finding of lung field: Secondary | ICD-10-CM

## 2015-04-28 DIAGNOSIS — D45 Polycythemia vera: Secondary | ICD-10-CM

## 2015-04-28 DIAGNOSIS — E785 Hyperlipidemia, unspecified: Secondary | ICD-10-CM

## 2015-04-28 LAB — CBC WITH DIFFERENTIAL/PLATELET
Basophils Absolute: 0.1 10*3/uL (ref 0–0.1)
Basophils Relative: 1 %
EOS ABS: 0.2 10*3/uL (ref 0–0.7)
Eosinophils Relative: 2 %
HEMATOCRIT: 44.9 % (ref 35.0–47.0)
HEMOGLOBIN: 14.6 g/dL (ref 12.0–16.0)
LYMPHS ABS: 2 10*3/uL (ref 1.0–3.6)
LYMPHS PCT: 23 %
MCH: 26.6 pg (ref 26.0–34.0)
MCHC: 32.5 g/dL (ref 32.0–36.0)
MCV: 81.9 fL (ref 80.0–100.0)
MONOS PCT: 8 %
Monocytes Absolute: 0.7 10*3/uL (ref 0.2–0.9)
NEUTROS PCT: 66 %
Neutro Abs: 5.7 10*3/uL (ref 1.4–6.5)
Platelets: 300 10*3/uL (ref 150–440)
RBC: 5.49 MIL/uL — AB (ref 3.80–5.20)
RDW: 15 % — ABNORMAL HIGH (ref 11.5–14.5)
WBC: 8.7 10*3/uL (ref 3.6–11.0)

## 2015-04-28 NOTE — Progress Notes (Signed)
Tina [name] OFFICE PROGRESS NOTE  [name] Care Team: Einar Pheasant, MD as PCP - General (Internal Medicine)   SUMMARY OF ONCOLOGIC HISTORY:  # SECONDARY ERYTHROCYTOSIS- Jak-2 neg& normal EPO;   # July 2016-LUNG NODULES- recm CT in June 2017  #  BRCA- POSITIVE   INTERVAL HISTORY:  This is my first interaction with the [name] since I joined the practice September 2016. I reviewed the [name]'s prior charts/pertinent labs/imaging in detail; findings are summarized above.   Tina very pleasant 80 year old female [name] with above history of erythrocytosis is here for follow-up.  [name] has been needing phlebotomies every 4 weeks or so.  [name] does not feel any different before or after phlebotomy.   Interestingly [name] also had recent mammogram that was abnormal;  And is due for Tina diagnostic mammogram tomorrow.   REVIEW OF SYSTEMS:  Tina complete 10 point review of system is done which is negative except mentioned above/history of present illness.   PAST MEDICAL HISTORY :  Past Medical History  Diagnosis Date  . CAD (coronary artery disease)   . Hypertension   . Hypercholesterolemia   . COPD (chronic obstructive pulmonary disease) ([place])   . Osteoporosis   . History of colon polyps   . Hyperglycemia   . Renal cyst   . Asthma   . Emphysema lung ([place])   . GERD (gastroesophageal reflux disease)   . Anemia   . BRCA positive   . Personal history of tobacco use, presenting hazards to health 11/25/2014  . Polycythemia vera(238.4)   . Skin cancer     PAST SURGICAL HISTORY :   Past Surgical History  Procedure Laterality Date  . Tonsilectomy/adenoidectomy with myringotomy    . Tubal ligation    . Angioplasty      coronary (x1)  . Coronary artery bypass graft    . Coronary angioplasty    . Colon surgery    . Salpingoophorectomy    . Colonoscopy with propofol N/Tina 03/02/2015    Procedure: COLONOSCOPY WITH PROPOFOL;  Surgeon: Lollie Sails, MD;  Location:  [facility] END[facility]COPY;  Service: Endoscopy;  Laterality: N/Tina;    FAMILY HISTORY :   Family History  Problem Relation Age of Onset  . Cirrhosis Father     died age 46  . Alcohol abuse Father   . Asthma Mother   . Congestive Heart Failure Mother   . Breast cancer Mother     2 (1/2 sisters)  . Osteoarthritis Mother   . Colon cancer Mother   . Lupus Sister   . Alcohol abuse Sister   . Ovarian cancer Sister   . Osteoporosis Sister   . Skin cancer Sister   . Breast cancer Maternal Aunt 62    SOCIAL HISTORY:   Social History  Substance Use Topics  . Smoking status: Former Smoker -- 1.00 packs/day for 45 years    Types: Cigarettes    Quit date: 11/11/2003  . Smokeless tobacco: Never Used  . Alcohol Use: No     Comment: occasional    ALLERGIES:  is allergic to evista and fluticasone-salmeterol.  MEDICATIONS:  Current Outpatient Prescriptions  Medication Sig Dispense Refill  . aspirin EC 81 MG tablet Take 81 mg by mouth daily.    . Calcium Carbonate-Vitamin D (CALCIUM-D) 600-400 MG-UNIT TABS Take 1 tablet by mouth daily.    . fluticasone (FLONASE) 50 MCG/ACT nasal spray Place 2 sprays into the nose daily. 16 g 5  . ipratropium-albuterol (DUONEB) 0.5-2.5 (3)  MG/3ML SOLN Take 3 mLs by nebulization every 6 (six) hours as needed (for shortness of breath). 36 mL 0  . lovastatin (MEVACOR) 40 MG tablet Take 1 tablet (40 mg total) by mouth daily. 90 tablet 3  . metoprolol tartrate (LOPRESSOR) 25 MG tablet Take 12.5 mg by mouth 2 (two) times daily.    . SYMBICORT 160-4.5 MCG/ACT inhaler Use 2 puffs two times daily 30.6 g 3  . triamterene-hydrochlorothiazide (MAXZIDE-25) 37.5-25 MG per tablet Take 1 tablet by mouth  daily 90 tablet 1   No current facility-administered medications for this visit.    PHYSICAL EXAMINATION: ECOG PERFORMANCE STATUS: 0 - Asymptomatic  BP 178/76 mmHg  Pulse 72  Temp(Src) 97.6 F (36.4 C) (Tympanic)  Wt 138 lb 0.1 oz (62.6 kg)  SpO2 96%  Filed Weights    04/28/15 1401  Weight: 138 lb 0.1 oz (62.6 kg)    GENERAL: Well-nourished well-developed; Alert, no distress and comfortable.  Alone EYES: no pallor or icterus OROPHARYNX: no thrush or ulceration; good dentition  NECK: supple, no masses felt LYMPH:  no palpable lymphadenopathy in the cervical, axillary or inguinal regions LUNGS: clear to auscultation and  No wheeze or crackles HEART/CVS: regular rate & rhythm and no murmurs; No lower extremity edema ABDOMEN:abdomen soft, non-tender and normal bowel sounds Musculoskeletal:no cyanosis of digits and no clubbing  PSYCH: alert & oriented x 3 with fluent speech NEURO: no focal motor/sensory deficits SKIN:  no rashes or significant lesions Right and left BREAST exam [in the presence of nurse]- no unusual skin changes or dominant masses felt.   LABORATORY DATA:  I have reviewed the data as listed    Component Value Date/Time   NA 143 01/22/2015   NA 133* 11/15/2014 0525   K 4.3 01/22/2015   CL 97* 11/15/2014 0525   CO2 27 11/15/2014 0525   GLUCOSE 198* 11/15/2014 0525   BUN 17 01/22/2015   BUN 35* 11/15/2014 0525   CREATININE 1.1 01/22/2015   CREATININE 1.06* 11/15/2014 0525   CALCIUM 9.1 11/15/2014 0525   PROT 7.0 11/13/2014 1637   ALBUMIN 4.0 11/13/2014 1637   AST 37 11/13/2014 1637   ALT 29 11/13/2014 1637   ALKPHOS 56 11/13/2014 1637   BILITOT 0.5 11/13/2014 1637   GFRNONAA 49* 11/15/2014 0525   GFRAA 57* 11/15/2014 0525    No results found for: SPEP, UPEP  Lab Results  Component Value Date   WBC 8.7 04/28/2015   NEUTROABS 5.7 04/28/2015   HGB 14.6 04/28/2015   HCT 44.9 04/28/2015   MCV 81.9 04/28/2015   PLT 300 04/28/2015      Chemistry      Component Value Date/Time   NA 143 01/22/2015   NA 133* 11/15/2014 0525   K 4.3 01/22/2015   CL 97* 11/15/2014 0525   CO2 27 11/15/2014 0525   BUN 17 01/22/2015   BUN 35* 11/15/2014 0525   CREATININE 1.1 01/22/2015   CREATININE 1.06* 11/15/2014 0525   GLU 128  01/22/2015      Component Value Date/Time   CALCIUM 9.1 11/15/2014 0525   ALKPHOS 56 11/13/2014 1637   AST 37 11/13/2014 1637   ALT 29 11/13/2014 1637   BILITOT 0.5 11/13/2014 1637       RADIOGRAPHIC STUDIES: I have personally reviewed the radiological images as listed and agreed with the findings in the report. No results found.    ASSESSMENT & PLAN:   # SECONDARY ERYTHROCYTOSIS- hematocrit today is 45; hold off any phlebotomy.  We will repeat the labs in 4 months.  # ABNORMAL MAMMOGRAM- awaiting diagnostic mammograms tomorrow. No obvious evidence of any dominant mass noted on exam.  # BRCA POSITIVE  # [name] was asked to call us if she was found to have any diagnosis of cancer post biopsy of her breast.  # 15 minutes face-to-face with the [name] discussing the above plan of care; more than 50% of time spent on prognosis/ natural history; counseling and coordination.  No orders of the defined types were placed in this encounter.

## 2015-04-29 ENCOUNTER — Ambulatory Visit
Admission: RE | Admit: 2015-04-29 | Discharge: 2015-04-29 | Disposition: A | Discharge status: Home / Self Care | Payer: Medicare Other | Source: Ambulatory Visit | Attending: Internal Medicine | Admitting: Internal Medicine

## 2015-04-29 DIAGNOSIS — N63 Unspecified lump in unspecified breast: Secondary | ICD-10-CM

## 2015-04-30 ENCOUNTER — Other Ambulatory Visit: Payer: Self-pay | Admitting: Internal Medicine

## 2015-04-30 DIAGNOSIS — R928 Other abnormal and inconclusive findings on diagnostic imaging of breast: Secondary | ICD-10-CM

## 2015-04-30 NOTE — Progress Notes (Signed)
Order placed for right breast mammo and ultrasound.

## 2015-05-04 ENCOUNTER — Other Ambulatory Visit: Payer: Self-pay | Admitting: Internal Medicine

## 2015-05-04 NOTE — Telephone Encounter (Signed)
This medication was a historical medication. Please advise?

## 2015-05-05 NOTE — Telephone Encounter (Signed)
Need to confirm with pt if still taking.  She sees cardiology as well.  We do not have this listed on our med list now.  (was taken off).

## 2015-05-07 ENCOUNTER — Other Ambulatory Visit: Payer: Self-pay

## 2015-05-07 ENCOUNTER — Encounter: Payer: Self-pay | Admitting: Internal Medicine

## 2015-05-07 MED ORDER — METOPROLOL TARTRATE 25 MG PO TABS: 12.5000 mg | ORAL_TABLET | Freq: Two times a day (BID) | ORAL | Status: DC

## 2015-05-07 NOTE — Telephone Encounter (Signed)
Pt is still taking this medication & never stopped it. Rx sent to [facility]

## 2015-05-07 NOTE — Telephone Encounter (Signed)
Metoprolol refill processed per [facility] request

## 2015-05-12 DIAGNOSIS — Z955 Presence of coronary angioplasty implant and graft: Secondary | ICD-10-CM | POA: Diagnosis not present

## 2015-05-12 DIAGNOSIS — E7800 Pure hypercholesterolemia, unspecified: Secondary | ICD-10-CM | POA: Diagnosis not present

## 2015-05-12 DIAGNOSIS — R002 Palpitations: Secondary | ICD-10-CM | POA: Diagnosis not present

## 2015-05-12 DIAGNOSIS — I10 Essential (primary) hypertension: Secondary | ICD-10-CM | POA: Diagnosis not present

## 2015-05-28 ENCOUNTER — Other Ambulatory Visit: Payer: Self-pay | Admitting: Internal Medicine

## 2015-05-28 DIAGNOSIS — R739 Hyperglycemia, unspecified: Secondary | ICD-10-CM | POA: Diagnosis not present

## 2015-05-28 DIAGNOSIS — E7800 Pure hypercholesterolemia, unspecified: Secondary | ICD-10-CM | POA: Diagnosis not present

## 2015-05-28 DIAGNOSIS — I10 Essential (primary) hypertension: Secondary | ICD-10-CM | POA: Diagnosis not present

## 2015-05-29 LAB — LIPID PANEL W/O CHOL/HDL RATIO
CHOLESTEROL TOTAL: 161 mg/dL (ref 100–199)
HDL: 69 mg/dL (ref >39)
LDL Calculated: 76 mg/dL (ref 0–99)
Triglycerides: 78 mg/dL (ref 0–149)
VLDL CHOLESTEROL CAL: 16 mg/dL (ref 5–40)

## 2015-05-29 LAB — HEPATIC FUNCTION PANEL
ALBUMIN: 4.4 g/dL (ref 3.5–4.8)
ALK PHOS: 57 IU/L (ref 39–117)
ALT: 13 IU/L (ref 0–32)
AST: 16 IU/L (ref 0–40)
BILIRUBIN TOTAL: 0.7 mg/dL (ref 0.0–1.2)
Bilirubin, Direct: 0.18 mg/dL (ref 0.00–0.40)
Total Protein: 6.8 g/dL (ref 6.0–8.5)

## 2015-05-29 LAB — BASIC METABOLIC PANEL
BUN / CREAT RATIO: 19 (ref 11–26)
BUN: 20 mg/dL (ref 8–27)
CALCIUM: 10.4 mg/dL — AB (ref 8.7–10.3)
CHLORIDE: 97 mmol/L (ref 96–106)
CO2: 27 mmol/L (ref 18–29)
Creatinine, Ser: 1.06 mg/dL — ABNORMAL HIGH (ref 0.57–1.00)
GFR calc Af Amer: 58 mL/min/{1.73_m2} — ABNORMAL LOW (ref >59)
GFR calc non Af Amer: 50 mL/min/{1.73_m2} — ABNORMAL LOW (ref >59)
GLUCOSE: 114 mg/dL — AB (ref 65–99)
POTASSIUM: 4.3 mmol/L (ref 3.5–5.2)
Sodium: 140 mmol/L (ref 134–144)

## 2015-05-29 LAB — TSH: TSH: 2.62 u[IU]/mL (ref 0.450–4.500)

## 2015-05-29 LAB — HGB A1C W/O EAG: Hgb A1c MFr Bld: 5.8 % — ABNORMAL HIGH (ref 4.8–5.6)

## 2015-06-02 ENCOUNTER — Ambulatory Visit (INDEPENDENT_AMBULATORY_CARE_PROVIDER_SITE_OTHER): Payer: Medicare Other | Admitting: Internal Medicine

## 2015-06-02 ENCOUNTER — Encounter: Payer: Self-pay | Admitting: Internal Medicine

## 2015-06-02 DIAGNOSIS — I2510 Atherosclerotic heart disease of native coronary artery without angina pectoris: Secondary | ICD-10-CM

## 2015-06-02 DIAGNOSIS — R928 Other abnormal and inconclusive findings on diagnostic imaging of breast: Secondary | ICD-10-CM

## 2015-06-02 DIAGNOSIS — N281 Cyst of kidney, acquired: Secondary | ICD-10-CM

## 2015-06-02 DIAGNOSIS — R739 Hyperglycemia, unspecified: Secondary | ICD-10-CM

## 2015-06-02 DIAGNOSIS — J449 Chronic obstructive pulmonary disease, unspecified: Secondary | ICD-10-CM

## 2015-06-02 DIAGNOSIS — I10 Essential (primary) hypertension: Secondary | ICD-10-CM

## 2015-06-02 DIAGNOSIS — D45 Polycythemia vera: Secondary | ICD-10-CM

## 2015-06-02 DIAGNOSIS — Q6100 Congenital renal cyst, unspecified: Secondary | ICD-10-CM

## 2015-06-02 DIAGNOSIS — E7800 Pure hypercholesterolemia, unspecified: Secondary | ICD-10-CM

## 2015-06-02 NOTE — Progress Notes (Signed)
Pre-visit discussion using our clinic review tool. No additional management support is needed unless otherwise documented below in the visit note.  

## 2015-06-02 NOTE — Progress Notes (Signed)
Patient ID: ALAURA SCHIPPERS, female   DOB: 1934-07-10, 81 y.o.   MRN: [id number]   Subjective:    Patient ID: [name], female    DOB: 03-04-1935, 81 y.o.   MRN: [id number]  HPI  Patient with past history of CAD, hypertension, COPD, GERD and hypercholesterolemia.  She comes in today to follow up on these issues.  She reports she is doing relatively well.  Increased stress with family issues.  No chest pain.  Breathing overall stable.  Due to f/u with Dr Raul Del this month.  Still line dancing.  Enjoys.  No acid reflux.  No abdominal pain or cramping.  Bowels stable.     Past Medical History  Diagnosis Date  . CAD (coronary artery disease)   . Hypertension   . Hypercholesterolemia   . COPD (chronic obstructive pulmonary disease) ([place])   . Osteoporosis   . History of colon polyps   . Hyperglycemia   . Renal cyst   . Asthma   . Emphysema lung ([place])   . GERD (gastroesophageal reflux disease)   . Anemia   . BRCA positive   . Personal history of tobacco use, presenting hazards to health 11/25/2014  . Polycythemia vera(238.4)   . Skin cancer    Past Surgical History  Procedure Laterality Date  . Tonsilectomy/adenoidectomy with myringotomy    . Tubal ligation    . Angioplasty      coronary (x1)  . Coronary artery bypass graft    . Coronary angioplasty    . Colon surgery    . Salpingoophorectomy    . Colonoscopy with propofol N/A 03/02/2015    Procedure: COLONOSCOPY WITH PROPOFOL;  Surgeon: Lollie Sails, MD;  Location: [facility] ENDOSCOPY;  Service: Endoscopy;  Laterality: N/A;   Family History  Problem Relation Age of Onset  . Cirrhosis Father     died age 13  . Alcohol abuse Father   . Asthma Mother   . Congestive Heart Failure Mother   . Breast cancer Mother     2 (1/2 sisters)  . Osteoarthritis Mother   . Colon cancer Mother   . Lupus Sister   . Alcohol abuse Sister   . Ovarian cancer Sister   . Osteoporosis Sister   . Skin cancer Sister   . Breast cancer Cousin     . Breast cancer Maternal Aunt    Social History   Social History  . Marital Status: Divorced    Spouse Name: N/A  . Number of Children: 2  . Years of Education: N/A   Social History Main Topics  . Smoking status: Former Smoker -- 1.00 packs/day for 45 years    Types: Cigarettes    Quit date: 11/11/2003  . Smokeless tobacco: Never Used  . Alcohol Use: No     Comment: occasional  . Drug Use: No  . Sexual Activity: Not Asked   Other Topics Concern  . None   Social History Narrative    Outpatient Encounter Prescriptions as of 06/02/2015  Medication Sig  . aspirin EC 81 MG tablet Take 81 mg by mouth daily.  . Calcium Carbonate-Vitamin D (CALCIUM-D) 600-400 MG-UNIT TABS Take 1 tablet by mouth daily.  . fluticasone (FLONASE) 50 MCG/ACT nasal spray Place 2 sprays into the nose daily.  Marland Kitchen ipratropium-albuterol (DUONEB) 0.5-2.5 (3) MG/3ML SOLN Take 3 mLs by nebulization every 6 (six) hours as needed (for shortness of breath).  . lovastatin (MEVACOR) 40 MG tablet Take 1 tablet (40 mg total)  by mouth daily.  . metoprolol tartrate (LOPRESSOR) 25 MG tablet Take 0.5 tablets (12.5 mg total) by mouth 2 (two) times daily.  . SYMBICORT 160-4.5 MCG/ACT inhaler Use 2 puffs two times daily  . triamterene-hydrochlorothiazide (MAXZIDE-25) 37.5-25 MG tablet Take 1 tablet by mouth  daily   No facility-administered encounter medications on file as of 06/02/2015.    Review of Systems  Constitutional: Negative for appetite change and unexpected weight change.  HENT: Negative for congestion and sinus pressure.   Eyes: Negative for discharge and redness.  Respiratory: Negative for cough, chest tightness and shortness of breath (breathing stable.  ).   Cardiovascular: Negative for chest pain, palpitations and leg swelling.  Gastrointestinal: Negative for nausea, vomiting, abdominal pain and diarrhea.  Genitourinary: Negative for dysuria and difficulty urinating.  Musculoskeletal: Negative for back  pain and joint swelling.  Skin: Negative for color change and rash.  Neurological: Negative for dizziness, light-headedness and headaches.  Psychiatric/Behavioral: Negative for dysphoric mood and agitation.       Objective:    Physical Exam  Constitutional: She appears well-developed and well-nourished. No distress.  HENT:  Nose: Nose normal.  Mouth/Throat: Oropharynx is clear and moist.  Eyes: Conjunctivae are normal. Right eye exhibits no discharge. Left eye exhibits no discharge.  Neck: Neck supple. No thyromegaly present.  Cardiovascular: Normal rate and regular rhythm.   Pulmonary/Chest: Breath sounds normal. No respiratory distress. She has no wheezes.  Abdominal: Soft. Bowel sounds are normal. There is no tenderness.  Musculoskeletal: She exhibits no edema or tenderness.  Lymphadenopathy:    She has no cervical adenopathy.  Skin: No rash noted. No erythema.  Psychiatric: She has a normal mood and affect. Her behavior is normal.    BP 130/80 mmHg  Pulse 62  Temp(Src) 98.7 F (37.1 C) (Oral)  Resp 18  Ht _0  (1.651 m)  Wt 135 lb 8 oz (61.462 kg)  BMI 22.55 kg/m2  SpO2 94% Wt Readings from Last 3 Encounters:  06/02/15 135 lb 8 oz (61.462 kg)  04/28/15 138 lb 0.1 oz (62.6 kg)  03/25/15 136 lb (61.689 kg)     Lab Results  Component Value Date   WBC 8.7 04/28/2015   HGB 14.6 04/28/2015   HCT 44.9 04/28/2015   PLT 300 04/28/2015   GLUCOSE 114* 05/28/2015   CHOL 161 05/28/2015   TRIG 78 05/28/2015   HDL 69 05/28/2015   LDLCALC 76 05/28/2015   ALT 13 05/28/2015   AST 16 05/28/2015   NA 140 05/28/2015   K 4.3 05/28/2015   CL 97 05/28/2015   CREATININE 1.06* 05/28/2015   BUN 20 05/28/2015   CO2 27 05/28/2015   TSH 2.620 05/28/2015   HGBA1C 5.8* 05/28/2015    US Breast Ltd Uni Right Inc Axilla  04/29/2015  CLINICAL DATA:  Screening recall for right breast mass. EXAM: DIGITAL DIAGNOSTIC RIGHT MAMMOGRAM WITH 3D TOMOSYNTHESIS AND CAD RIGHT BREAST  ULTRASOUND COMPARISON:  Previous exam(s). ACR Breast Density Category b: There are scattered areas of fibroglandular density. FINDINGS: Cc and MLO tomosynthesis was performed of the right breast demonstrating an oval well-circumscribed mass in the upper slightly outer right breast measuring approximately 2-3 mm. Mammographic images were processed with CAD. Physical examination of the outer and central right breast does not reveal any palpable masses. Targeted ultrasound of the right breast was performed demonstrating in a round circumscribed hypoechoic mass at 10 o'clock 4 cm from the nipple deep within the breast measuring approximately 0.2 x 0.2  x 0.3 cm. This corresponds with mammography findings and likely represents a tiny cyst or lymph node, however is too small and deep to accurately characterize. IMPRESSION: Probably benign right breast mass. RECOMMENDATION: Diagnostic mammography of the right breast in 6 months with possible right breast ultrasound to demonstrate stability of the probably benign right breast mass. I have discussed the findings and recommendations with the patient. Results were also provided in writing at the conclusion of the visit. If applicable, a reminder letter will be sent to the patient regarding the next appointment. BI-RADS CATEGORY  3: Probably benign. Electronically Signed   By: Everlean Alstrom M.D.   On: 04/29/2015 09:46   Mm Diag Breast Tomo Uni Right  04/29/2015  CLINICAL DATA:  Screening recall for right breast mass. EXAM: DIGITAL DIAGNOSTIC RIGHT MAMMOGRAM WITH 3D TOMOSYNTHESIS AND CAD RIGHT BREAST ULTRASOUND COMPARISON:  Previous exam(s). ACR Breast Density Category b: There are scattered areas of fibroglandular density. FINDINGS: Cc and MLO tomosynthesis was performed of the right breast demonstrating an oval well-circumscribed mass in the upper slightly outer right breast measuring approximately 2-3 mm. Mammographic images were processed with CAD. Physical examination  of the outer and central right breast does not reveal any palpable masses. Targeted ultrasound of the right breast was performed demonstrating in a round circumscribed hypoechoic mass at 10 o'clock 4 cm from the nipple deep within the breast measuring approximately 0.2 x 0.2 x 0.3 cm. This corresponds with mammography findings and likely represents a tiny cyst or lymph node, however is too small and deep to accurately characterize. IMPRESSION: Probably benign right breast mass. RECOMMENDATION: Diagnostic mammography of the right breast in 6 months with possible right breast ultrasound to demonstrate stability of the probably benign right breast mass. I have discussed the findings and recommendations with the patient. Results were also provided in writing at the conclusion of the visit. If applicable, a reminder letter will be sent to the patient regarding the next appointment. BI-RADS CATEGORY  3: Probably benign. Electronically Signed   By: Everlean Alstrom M.D.   On: 04/29/2015 09:46       Assessment & Plan:   Problem List Items Addressed This Visit    Abnormal mammogram    Mammogram 04/2015 - Birads III.  Recommended f/u in 6 months.  Already scheduled.        CAD (coronary artery disease)    Followed by Dr Saralyn Pilar.  Evaluated 05/12/15.  Continue risk factor modification.        COPD (chronic obstructive pulmonary disease) ([place])    Followed by Dr Raul Del.  Breathing stable.        Hypercholesterolemia    On lovastatin.  Low cholesterol diet and exercise.  Follow lipid panel and liver function tests.        Hyperglycemia    Low carb diet and exercise.  Follow met b and a1c.        Hypertension    Blood pressure under good control.  Continue same medication regimen.  Follow pressures.  Follow metabolic panel.        Polycythemia vera ([place])    Sees hematology.  S/p phlebotomy prn.  Last 03/17/15.        Renal cyst    Saw urology.  Felt no further w/up warranted.  Follow.         Other Visit Diagnoses    Hypercalcemia    -  Primary    Relevant Orders    Calcium  Calcium, ionized        Einar Pheasant, MD

## 2015-06-06 ENCOUNTER — Encounter: Payer: Self-pay | Admitting: Internal Medicine

## 2015-06-06 DIAGNOSIS — R928 Other abnormal and inconclusive findings on diagnostic imaging of breast: Secondary | ICD-10-CM | POA: Insufficient documentation

## 2015-06-06 NOTE — Assessment & Plan Note (Signed)
Low carb diet and exercise.  Follow met b and a1c.   

## 2015-06-06 NOTE — Assessment & Plan Note (Signed)
Followed by Dr Saralyn Pilar.  Evaluated 05/12/15.  Continue risk factor modification.

## 2015-06-06 NOTE — Assessment & Plan Note (Signed)
Followed by Dr Fleming.  Breathing stable.   

## 2015-06-06 NOTE — Assessment & Plan Note (Signed)
Saw urology.  Felt no further w/up warranted.  Follow.

## 2015-06-06 NOTE — Assessment & Plan Note (Signed)
On lovastatin.  Low cholesterol diet and exercise.  Follow lipid panel and liver function tests.   

## 2015-06-06 NOTE — Assessment & Plan Note (Signed)
Blood pressure under good control.  Continue same medication regimen.  Follow pressures.  Follow metabolic panel.   

## 2015-06-06 NOTE — Assessment & Plan Note (Signed)
Sees hematology.  S/p phlebotomy prn.  Last 03/17/15.

## 2015-06-06 NOTE — Assessment & Plan Note (Signed)
Mammogram 04/2015 - Birads III.  Recommended f/u in 6 months.  Already scheduled.

## 2015-06-23 ENCOUNTER — Other Ambulatory Visit: Payer: Medicare Other

## 2015-06-24 ENCOUNTER — Other Ambulatory Visit: Payer: Medicare Other

## 2015-06-30 ENCOUNTER — Other Ambulatory Visit (INDEPENDENT_AMBULATORY_CARE_PROVIDER_SITE_OTHER): Payer: Medicare Other

## 2015-06-30 LAB — CALCIUM: CALCIUM: 10.2 mg/dL (ref 8.4–10.5)

## 2015-06-30 NOTE — Progress Notes (Signed)
Patient here today for lab draw only. 

## 2015-07-01 LAB — CALCIUM, IONIZED: CALCIUM ION: 1.28 mmol/L (ref 1.12–1.32)

## 2015-08-30 ENCOUNTER — Other Ambulatory Visit: Payer: Medicare Other

## 2015-08-30 ENCOUNTER — Ambulatory Visit: Payer: Medicare Other | Admitting: Internal Medicine

## 2015-09-01 ENCOUNTER — Encounter: Payer: Self-pay | Admitting: Internal Medicine

## 2015-09-01 ENCOUNTER — Inpatient Hospital Stay (HOSPITAL_BASED_OUTPATIENT_CLINIC_OR_DEPARTMENT_OTHER): Payer: Medicare Other | Admitting: Internal Medicine

## 2015-09-01 ENCOUNTER — Inpatient Hospital Stay: Payer: Medicare Other

## 2015-09-01 ENCOUNTER — Inpatient Hospital Stay: Payer: Medicare Other | Attending: Internal Medicine

## 2015-09-01 VITALS — BP 198/66 | HR 50 | Temp 97.4°F | Resp 18 | Ht 65.0 in | Wt 132.7 lb

## 2015-09-01 VITALS — BP 161/70 | HR 52 | Temp 97.4°F | Resp 18

## 2015-09-01 DIAGNOSIS — Z86010 Personal history of colonic polyps: Secondary | ICD-10-CM

## 2015-09-01 DIAGNOSIS — Z87891 Personal history of nicotine dependence: Secondary | ICD-10-CM | POA: Insufficient documentation

## 2015-09-01 DIAGNOSIS — J45909 Unspecified asthma, uncomplicated: Secondary | ICD-10-CM | POA: Insufficient documentation

## 2015-09-01 DIAGNOSIS — I10 Essential (primary) hypertension: Secondary | ICD-10-CM | POA: Insufficient documentation

## 2015-09-01 DIAGNOSIS — Z79899 Other long term (current) drug therapy: Secondary | ICD-10-CM | POA: Insufficient documentation

## 2015-09-01 DIAGNOSIS — D471 Chronic myeloproliferative disease: Secondary | ICD-10-CM | POA: Insufficient documentation

## 2015-09-01 DIAGNOSIS — Z1501 Genetic susceptibility to malignant neoplasm of breast: Secondary | ICD-10-CM | POA: Insufficient documentation

## 2015-09-01 DIAGNOSIS — I2510 Atherosclerotic heart disease of native coronary artery without angina pectoris: Secondary | ICD-10-CM

## 2015-09-01 DIAGNOSIS — Z7982 Long term (current) use of aspirin: Secondary | ICD-10-CM | POA: Insufficient documentation

## 2015-09-01 DIAGNOSIS — E7800 Pure hypercholesterolemia, unspecified: Secondary | ICD-10-CM

## 2015-09-01 DIAGNOSIS — Z85828 Personal history of other malignant neoplasm of skin: Secondary | ICD-10-CM | POA: Insufficient documentation

## 2015-09-01 DIAGNOSIS — Z8041 Family history of malignant neoplasm of ovary: Secondary | ICD-10-CM

## 2015-09-01 DIAGNOSIS — R739 Hyperglycemia, unspecified: Secondary | ICD-10-CM | POA: Insufficient documentation

## 2015-09-01 DIAGNOSIS — R918 Other nonspecific abnormal finding of lung field: Secondary | ICD-10-CM | POA: Diagnosis not present

## 2015-09-01 DIAGNOSIS — J449 Chronic obstructive pulmonary disease, unspecified: Secondary | ICD-10-CM | POA: Insufficient documentation

## 2015-09-01 DIAGNOSIS — Z801 Family history of malignant neoplasm of trachea, bronchus and lung: Secondary | ICD-10-CM | POA: Diagnosis not present

## 2015-09-01 DIAGNOSIS — R928 Other abnormal and inconclusive findings on diagnostic imaging of breast: Secondary | ICD-10-CM | POA: Diagnosis not present

## 2015-09-01 DIAGNOSIS — R5383 Other fatigue: Secondary | ICD-10-CM

## 2015-09-01 DIAGNOSIS — N281 Cyst of kidney, acquired: Secondary | ICD-10-CM

## 2015-09-01 DIAGNOSIS — R531 Weakness: Secondary | ICD-10-CM

## 2015-09-01 DIAGNOSIS — K219 Gastro-esophageal reflux disease without esophagitis: Secondary | ICD-10-CM

## 2015-09-01 DIAGNOSIS — Z803 Family history of malignant neoplasm of breast: Secondary | ICD-10-CM

## 2015-09-01 DIAGNOSIS — D45 Polycythemia vera: Secondary | ICD-10-CM

## 2015-09-01 DIAGNOSIS — M818 Other osteoporosis without current pathological fracture: Secondary | ICD-10-CM

## 2015-09-01 LAB — CBC WITH DIFFERENTIAL/PLATELET
BASOS ABS: 0.1 10*3/uL (ref 0–0.1)
BASOS PCT: 1 %
Eosinophils Absolute: 0.3 10*3/uL (ref 0–0.7)
Eosinophils Relative: 3 %
HEMATOCRIT: 47.6 % — AB (ref 35.0–47.0)
Hemoglobin: 16.5 g/dL — ABNORMAL HIGH (ref 12.0–16.0)
LYMPHS PCT: 17 %
Lymphs Abs: 1.8 10*3/uL (ref 1.0–3.6)
MCH: 29.5 pg (ref 26.0–34.0)
MCHC: 34.7 g/dL (ref 32.0–36.0)
MCV: 85.2 fL (ref 80.0–100.0)
Monocytes Absolute: 1.1 10*3/uL — ABNORMAL HIGH (ref 0.2–0.9)
Monocytes Relative: 10 %
NEUTROS ABS: 7.6 10*3/uL — AB (ref 1.4–6.5)
Neutrophils Relative %: 69 %
PLATELETS: 278 10*3/uL (ref 150–440)
RBC: 5.58 MIL/uL — AB (ref 3.80–5.20)
RDW: 15.1 % — ABNORMAL HIGH (ref 11.5–14.5)
WBC: 10.9 10*3/uL (ref 3.6–11.0)

## 2015-09-01 NOTE — Progress Notes (Signed)
[place] OFFICE PROGRESS NOTE  Patient Care Team: Einar Pheasant, MD as PCP - General (Internal Medicine)   SUMMARY OF ONCOLOGIC HISTORY:  # SECONDARY ERYTHROCYTOSIS- Jak-2 neg& normal EPO; phlebotomy 250 mL every 4 months [HCTgoal< 45]  # July 2016-LUNG NODULES- recm screening CT in June 2017  #  BRCA- POSITIVE [prophylactic BSO 2013]; mammogram/US in JUne 2017  INTERVAL HISTORY:  A very pleasant 81 year old female patient with above history of erythrocytosis is here for follow-up.   Patient feels tired. She has headaches in the last few days. Positive for fatigue. Otherwise no nausea vomiting no swelling in the legs. No chest pain or shortness of breath or cough.   REVIEW OF SYSTEMS:  A complete 10 point review of system is done which is negative except mentioned above/history of present illness.   PAST MEDICAL HISTORY :  Past Medical History  Diagnosis Date  . CAD (coronary artery disease)   . Hypertension   . Hypercholesterolemia   . COPD (chronic obstructive pulmonary disease) ([place])   . Osteoporosis   . History of colon polyps   . Hyperglycemia   . Renal cyst   . Asthma   . Emphysema lung ([place])   . GERD (gastroesophageal reflux disease)   . Anemia   . BRCA positive   . Personal history of tobacco use, presenting hazards to health 11/25/2014  . Polycythemia vera(238.4)   . Skin cancer     PAST SURGICAL HISTORY :   Past Surgical History  Procedure Laterality Date  . Tonsilectomy/adenoidectomy with myringotomy    . Tubal ligation    . Angioplasty      coronary (x1)  . Coronary artery bypass graft    . Coronary angioplasty    . Colon surgery    . Salpingoophorectomy    . Colonoscopy with propofol N/A 03/02/2015    Procedure: COLONOSCOPY WITH PROPOFOL;  Surgeon: Lollie Sails, MD;  Location[facility] ENDOSCOPY;  Service: Endoscopy;  Laterality: N/A;    FAMILY HISTORY :   Family History  Problem Relation Age of Onset  . Cirrhosis Father      died age 34  . Alcohol abuse Father   . Asthma Mother   . Congestive Heart Failure Mother   . Breast cancer Mother     2 (1/2 sisters)  . Osteoarthritis Mother   . Colon cancer Mother   . Lupus Sister   . Alcohol abuse Sister   . Ovarian cancer Sister   . Osteoporosis Sister   . Skin cancer Sister   . Breast cancer Cousin   . Breast cancer Maternal Aunt     SOCIAL HISTORY:   Social History  Substance Use Topics  . Smoking status: Former Smoker -- 1.00 packs/day for 45 years    Types: Cigarettes    Quit date: 11/11/2003  . Smokeless tobacco: Never Used  . Alcohol Use: No     Comment: occasional    ALLERGIES:  is allergic to evista and fluticasone-salmeterol.  MEDICATIONS:  Current Outpatient Prescriptions  Medication Sig Dispense Refill  . aspirin EC 81 MG tablet Take 81 mg by mouth daily.    . Calcium Carbonate-Vitamin D (CALCIUM-D) 600-400 MG-UNIT TABS Take 1 tablet by mouth daily.    . fluticasone (FLONASE) 50 MCG/ACT nasal spray Place 2 sprays into the nose daily. 16 g 5  . ipratropium-albuterol (DUONEB) 0.5-2.5 (3) MG/3ML SOLN Take 3 mLs by nebulization every 6 (six) hours as needed (for shortness of breath). [place]  mL 0  . lovastatin (MEVACOR) 40 MG tablet Take 1 tablet (40 mg total) by mouth daily. 90 tablet 3  . metoprolol tartrate (LOPRESSOR) 25 MG tablet Take 0.5 tablets (12.5 mg total) by mouth 2 (two) times daily. 90 tablet 3  . SYMBICORT 160-4.5 MCG/ACT inhaler Use 2 puffs two times daily 30.6 g 3  . triamterene-hydrochlorothiazide (MAXZIDE-25) 37.5-25 MG tablet Take 1 tablet by mouth  daily 90 tablet 2   No current facility-administered medications for this visit.    PHYSICAL EXAMINATION: ECOG PERFORMANCE STATUS: 0 - Asymptomatic  BP 198/66 mmHg  Pulse 50  Temp(Src) 97.4 F (36.3 C) (Tympanic)  Resp 18  Ht '5\' 5"'  (1.651 m)  Wt 132 lb 11.5 oz (60.2 kg)  BMI 22.09 kg/m2  SpO2 95%  Filed Weights   09/01/15 1028  Weight: 132 lb 11.5 oz (60.2 kg)     GENERAL: Well-nourished well-developed; Alert, no distress and comfortable.  Alone EYES: no pallor or icterus OROPHARYNX: no thrush or ulceration; good dentition  NECK: supple, no masses felt LYMPH:  no palpable lymphadenopathy in the cervical, axillary or inguinal regions LUNGS: clear to auscultation and  No wheeze or crackles HEART/CVS: regular rate & rhythm and no murmurs; No lower extremity edema ABDOMEN:abdomen soft, non-tender and normal bowel sounds Musculoskeletal:no cyanosis of digits and no clubbing  PSYCH: alert & oriented x 3 with fluent speech NEURO: no focal motor/sensory deficits SKIN:  no rashes or significant lesions Right and left BREAST exam [in the presence of nurse]- no unusual skin changes or dominant masses felt.   LABORATORY DATA:  I have reviewed the data as listed    Component Value Date/Time   NA 140 05/28/2015 1024   NA 133* 11/15/2014 0525   K 4.3 05/28/2015 1024   CL 97 05/28/2015 1024   CO2 27 05/28/2015 1024   GLUCOSE 114* 05/28/2015 1024   GLUCOSE 198* 11/15/2014 0525   BUN 20 05/28/2015 1024   BUN 35* 11/15/2014 0525   CREATININE 1.06* 05/28/2015 1024   CREATININE 1.1 01/22/2015   CALCIUM 10.2 06/30/2015 0932   PROT 6.8 05/28/2015 1024   PROT 7.0 11/13/2014 1637   ALBUMIN 4.4 05/28/2015 1024   ALBUMIN 4.0 11/13/2014 1637   AST 16 05/28/2015 1024   ALT 13 05/28/2015 1024   ALKPHOS 57 05/28/2015 1024   BILITOT 0.7 05/28/2015 1024   BILITOT 0.5 11/13/2014 1637   GFRNONAA 50* 05/28/2015 1024   GFRAA 58* 05/28/2015 1024    No results found for: SPEP, UPEP  Lab Results  Component Value Date   WBC 10.9 09/01/2015   NEUTROABS 7.6* 09/01/2015   HGB 16.5* 09/01/2015   HCT 47.6* 09/01/2015   MCV 85.2 09/01/2015   PLT 278 09/01/2015      Chemistry      Component Value Date/Time   NA 140 05/28/2015 1024   NA 133* 11/15/2014 0525   K 4.3 05/28/2015 1024   CL 97 05/28/2015 1024   CO2 27 05/28/2015 1024   BUN 20 05/28/2015 1024    BUN 35* 11/15/2014 0525   CREATININE 1.06* 05/28/2015 1024   CREATININE 1.1 01/22/2015   GLU 128 01/22/2015      Component Value Date/Time   CALCIUM 10.2 06/30/2015 0932   ALKPHOS 57 05/28/2015 1024   AST 16 05/28/2015 1024   ALT 13 05/28/2015 1024   BILITOT 0.7 05/28/2015 1024   BILITOT 0.5 11/13/2014 1637          ASSESSMENT & PLAN:   #  SECONDARY ERYTHROCYTOSIS- hematocrit today is 47.6.; Plan phlebotomy today.. We will repeat the labs in 4 months/possible phlebotomy if hematocrit greater than 45.  # ABNORMAL MAMMOGRAM/ # BRCA POSITIVE- dec 2016 Diagnostic mammogram and ultrasound- unremarkable except for a possible cyst. She has mammogram ultrasound scheduled in June 2017.  # Lung nodules/ on CT scanning protocol. We will inform Raquel Sarna; is due for a CAT scan in July 2017.  # CBC possible phlebotomy 4 months; follow-up with me in 8 months CBC possible phlebotomy.

## 2015-09-16 DIAGNOSIS — J449 Chronic obstructive pulmonary disease, unspecified: Secondary | ICD-10-CM | POA: Diagnosis not present

## 2015-09-30 ENCOUNTER — Ambulatory Visit (INDEPENDENT_AMBULATORY_CARE_PROVIDER_SITE_OTHER): Payer: Medicare Other | Admitting: Internal Medicine

## 2015-09-30 ENCOUNTER — Encounter: Payer: Self-pay | Admitting: Internal Medicine

## 2015-09-30 VITALS — BP 140/80 | HR 60 | Temp 97.9°F | Resp 18 | Ht 65.0 in | Wt 132.2 lb

## 2015-09-30 DIAGNOSIS — I2510 Atherosclerotic heart disease of native coronary artery without angina pectoris: Secondary | ICD-10-CM | POA: Diagnosis not present

## 2015-09-30 DIAGNOSIS — M810 Age-related osteoporosis without current pathological fracture: Secondary | ICD-10-CM

## 2015-09-30 DIAGNOSIS — R739 Hyperglycemia, unspecified: Secondary | ICD-10-CM | POA: Diagnosis not present

## 2015-09-30 DIAGNOSIS — E7800 Pure hypercholesterolemia, unspecified: Secondary | ICD-10-CM

## 2015-09-30 DIAGNOSIS — I10 Essential (primary) hypertension: Secondary | ICD-10-CM

## 2015-09-30 DIAGNOSIS — D45 Polycythemia vera: Secondary | ICD-10-CM

## 2015-09-30 DIAGNOSIS — J449 Chronic obstructive pulmonary disease, unspecified: Secondary | ICD-10-CM

## 2015-09-30 DIAGNOSIS — R928 Other abnormal and inconclusive findings on diagnostic imaging of breast: Secondary | ICD-10-CM

## 2015-09-30 LAB — BASIC METABOLIC PANEL
BUN: 23 mg/dL (ref 6–23)
CALCIUM: 10.1 mg/dL (ref 8.4–10.5)
CO2: 32 mEq/L (ref 19–32)
Chloride: 100 mEq/L (ref 96–112)
Creatinine, Ser: 1.13 mg/dL (ref 0.40–1.20)
GFR: 49.39 mL/min — AB (ref >60.00)
GLUCOSE: 120 mg/dL — AB (ref 70–99)
Potassium: 3.7 mEq/L (ref 3.5–5.1)
SODIUM: 139 meq/L (ref 135–145)

## 2015-09-30 LAB — LIPID PANEL
CHOLESTEROL: 173 mg/dL (ref 0–200)
HDL: 58.1 mg/dL (ref >39.00)
LDL CALC: 96 mg/dL (ref 0–99)
NonHDL: 114.75
TRIGLYCERIDES: 93 mg/dL (ref 0.0–149.0)
Total CHOL/HDL Ratio: 3
VLDL: 18.6 mg/dL (ref 0.0–40.0)

## 2015-09-30 LAB — VITAMIN D 25 HYDROXY (VIT D DEFICIENCY, FRACTURES): VITD: 25.13 ng/mL — AB (ref 30.00–100.00)

## 2015-09-30 LAB — HEPATIC FUNCTION PANEL
ALBUMIN: 4.4 g/dL (ref 3.5–5.2)
ALT: 12 U/L (ref 0–35)
AST: 16 U/L (ref 0–37)
Alkaline Phosphatase: 47 U/L (ref 39–117)
Bilirubin, Direct: 0.1 mg/dL (ref 0.0–0.3)
TOTAL PROTEIN: 6.9 g/dL (ref 6.0–8.3)
Total Bilirubin: 0.9 mg/dL (ref 0.2–1.2)

## 2015-09-30 LAB — HEMOGLOBIN A1C: HEMOGLOBIN A1C: 6 % (ref 4.6–6.5)

## 2015-09-30 MED ORDER — PREDNISONE 10 MG PO TABS: ORAL_TABLET | ORAL | Status: DC

## 2015-09-30 NOTE — Progress Notes (Signed)
Pre-visit discussion using our clinic review tool. No additional management support is needed unless otherwise documented below in the visit note.  

## 2015-09-30 NOTE — Progress Notes (Signed)
[name], female   DOB: [birth year], 81 y.o.   MRN: [id number]   Subjective:    [name], female    DOB: 03/17/1935, 81 y.o.   MRN: [id number]  HPI  Patient here for a scheduled follow up.  Just saw Dr Raul Del 09/16/15.  Felt stable.  No changes made.  Recommended f/u in 6 months.  Also seeing Dr Rogue Bussing (hematology).  S/p phlebotomy 09/01/15.  They are following counts.  She also had abnormal mammogram in 04/2015.  Scheduled for f/u mammogram in June.  Also they are following lung nodules.  F/u CT scan in July.  She reports some increased throat and chest congestion over the last week.  No chest tightness.  No fever.  Increased cough.  No acid reflux reported.  No abdominal pain or cramping.  Bowels stable.  Increased stress.  Discussed with her today.  Does not feel needs any further intervention.  Blood pressure averaging 937-169 systolic readings.     Past Medical History  Diagnosis Date  . CAD (coronary artery disease)   . Hypertension   . Hypercholesterolemia   . COPD (chronic obstructive pulmonary disease) ([place])   . Osteoporosis   . History of colon polyps   . Hyperglycemia   . Renal cyst   . Asthma   . Emphysema lung ([place])   . GERD (gastroesophageal reflux disease)   . Anemia   . BRCA positive   . Personal history of tobacco use, presenting hazards to health 11/25/2014  . Polycythemia vera(238.4)   . Skin cancer    Past Surgical History  Procedure Laterality Date  . Tonsilectomy/adenoidectomy with myringotomy    . Tubal ligation    . Angioplasty      coronary (x1)  . Coronary artery bypass graft    . Coronary angioplasty    . Colon surgery    . Salpingoophorectomy    . Colonoscopy with propofol N/A 03/02/2015    Procedure: COLONOSCOPY WITH PROPOFOL;  Surgeon: Lollie Sails, MD;  Location: [facility] ENDOSCOPY;  Service: Endoscopy;  Laterality: N/A;   Family History  Problem Relation Age of Onset  . Cirrhosis Father     died age 64  .  Alcohol abuse Father   . Asthma Mother   . Congestive Heart Failure Mother   . Breast cancer Mother     2 (1/2 sisters)  . Osteoarthritis Mother   . Colon cancer Mother   . Lupus Sister   . Alcohol abuse Sister   . Ovarian cancer Sister   . Osteoporosis Sister   . Skin cancer Sister   . Breast cancer Cousin   . Breast cancer Maternal Aunt    Social History   Social History  . Marital Status: Divorced    Spouse Name: N/A  . Number of Children: 2  . Years of Education: N/A   Social History Main Topics  . Smoking status: Former Smoker -- 1.00 packs/day for 45 years    Types: Cigarettes    Quit date: 11/11/2003  . Smokeless tobacco: Never Used  . Alcohol Use: No     Comment: occasional  . Drug Use: No  . Sexual Activity: Not Asked   Other Topics Concern  . None   Social History Narrative    Outpatient Encounter Prescriptions as of 09/30/2015  Medication Sig  . aspirin EC 81 MG tablet Take 81 mg by mouth daily.  . Calcium Carbonate-Vitamin D (CALCIUM-D) 600-400 MG-UNIT TABS Take  1 tablet by mouth daily.  . fluticasone (FLONASE) 50 MCG/ACT nasal spray Place 2 sprays into the nose daily.  Marland Kitchen ipratropium-albuterol (DUONEB) 0.5-2.5 (3) MG/3ML SOLN Take 3 mLs by nebulization every 6 (six) hours as needed (for shortness of breath).  . lovastatin (MEVACOR) 40 MG tablet Take 1 tablet (40 mg total) by mouth daily.  . metoprolol tartrate (LOPRESSOR) 25 MG tablet Take 0.5 tablets (12.5 mg total) by mouth 2 (two) times daily.  . SYMBICORT 160-4.5 MCG/ACT inhaler Use 2 puffs two times daily  . triamterene-hydrochlorothiazide (MAXZIDE-25) 37.5-25 MG tablet Take 1 tablet by mouth  daily  . predniSONE (DELTASONE) 10 MG tablet Take 6 tablets x 1 day and then decrease by 1/2 tablet per day until down to zero mg.   No facility-administered encounter medications on file as of 09/30/2015.    Review of Systems  Constitutional: Negative for appetite change and unexpected weight change.    HENT: Positive for congestion (throat congestion. ). Negative for sinus pressure.   Respiratory: Positive for cough (with chest congestion. ). Negative for chest tightness and shortness of breath.   Cardiovascular: Negative for chest pain, palpitations and leg swelling.  Gastrointestinal: Negative for nausea, vomiting, abdominal pain and diarrhea.  Genitourinary: Negative for dysuria and difficulty urinating.  Musculoskeletal: Negative for myalgias and joint swelling.  Skin: Negative for color change and rash.  Neurological: Negative for dizziness, light-headedness and headaches.  Psychiatric/Behavioral: Negative for dysphoric mood and agitation.       Objective:    Physical Exam  Constitutional: She appears well-developed and well-nourished. No distress.  HENT:  Nose: Nose normal.  Mouth/Throat: Oropharynx is clear and moist.  Neck: Neck supple. No thyromegaly present.  Cardiovascular: Normal rate and regular rhythm.   Pulmonary/Chest: Breath sounds normal. No respiratory distress. She has no wheezes.  Abdominal: Soft. Bowel sounds are normal. There is no tenderness.  Musculoskeletal: She exhibits no edema or tenderness.  Lymphadenopathy:    She has no cervical adenopathy.  Skin: No rash noted. No erythema.  Psychiatric: She has a normal mood and affect. Her behavior is normal.    BP 140/80 mmHg  Pulse 60  Temp(Src) 97.9 F (36.6 C) (Oral)  Resp 18  Ht '5\' 5"'  (1.651 m)  Wt 132 lb 4 oz (59.988 kg)  BMI 22.01 kg/m2  SpO2 95% Wt Readings from Last 3 Encounters:  09/30/15 132 lb 4 oz (59.988 kg)  09/01/15 132 lb 11.5 oz (60.2 kg)  06/02/15 135 lb 8 oz (61.462 kg)     Lab Results  Component Value Date   WBC 10.9 09/01/2015   HGB 16.5* 09/01/2015   HCT 47.6* 09/01/2015   PLT 278 09/01/2015   GLUCOSE 120* 09/30/2015   CHOL 173 09/30/2015   TRIG 93.0 09/30/2015   HDL 58.10 09/30/2015   LDLCALC 96 09/30/2015   ALT 12 09/30/2015   AST 16 09/30/2015   NA 139  09/30/2015   K 3.7 09/30/2015   CL 100 09/30/2015   CREATININE 1.13 09/30/2015   BUN 23 09/30/2015   CO2 32 09/30/2015   TSH 2.620 05/28/2015   HGBA1C 6.0 09/30/2015    US Breast [place] Axilla  04/29/2015  CLINICAL DATA:  Screening recall for right breast mass. EXAM: DIGITAL DIAGNOSTIC RIGHT MAMMOGRAM WITH 3D TOMOSYNTHESIS AND CAD RIGHT BREAST ULTRASOUND COMPARISON:  Previous exam(s). ACR Breast Density Category b: There are scattered areas of fibroglandular density. FINDINGS: Cc and MLO tomosynthesis was performed of the right breast demonstrating  an oval well-circumscribed mass in the upper slightly outer right breast measuring approximately 2-3 mm. Mammographic images were processed with CAD. Physical examination of the outer and central right breast does not reveal any palpable masses. Targeted ultrasound of the right breast was performed demonstrating in a round circumscribed hypoechoic mass at 10 o'clock 4 cm from the nipple deep within the breast measuring approximately 0.2 x 0.2 x 0.3 cm. This corresponds with mammography findings and likely represents a tiny cyst or lymph node, however is too small and deep to accurately characterize. IMPRESSION: Probably benign right breast mass. RECOMMENDATION: Diagnostic mammography of the right breast in 6 months with possible right breast ultrasound to demonstrate stability of the probably benign right breast mass. I have discussed the findings and recommendations with the patient. Results were also provided in writing at the conclusion of the visit. If applicable, a reminder letter will be sent to the patient regarding the next appointment. BI-RADS CATEGORY  3: Probably benign. Electronically Signed   By: Everlean Alstrom M.D.   On: 04/29/2015 09:46   Mm Diag Breast Tomo Uni Right  04/29/2015  CLINICAL DATA:  Screening recall for right breast mass. EXAM: DIGITAL DIAGNOSTIC RIGHT MAMMOGRAM WITH 3D TOMOSYNTHESIS AND CAD RIGHT BREAST ULTRASOUND  COMPARISON:  Previous exam(s). ACR Breast Density Category b: There are scattered areas of fibroglandular density. FINDINGS: Cc and MLO tomosynthesis was performed of the right breast demonstrating an oval well-circumscribed mass in the upper slightly outer right breast measuring approximately 2-3 mm. Mammographic images were processed with CAD. Physical examination of the outer and central right breast does not reveal any palpable masses. Targeted ultrasound of the right breast was performed demonstrating in a round circumscribed hypoechoic mass at 10 o'clock 4 cm from the nipple deep within the breast measuring approximately 0.2 x 0.2 x 0.3 cm. This corresponds with mammography findings and likely represents a tiny cyst or lymph node, however is too small and deep to accurately characterize. IMPRESSION: Probably benign right breast mass. RECOMMENDATION: Diagnostic mammography of the right breast in 6 months with possible right breast ultrasound to demonstrate stability of the probably benign right breast mass. I have discussed the findings and recommendations with the patient. Results were also provided in writing at the conclusion of the visit. If applicable, a reminder letter will be sent to the patient regarding the next appointment. BI-RADS CATEGORY  3: Probably benign. Electronically Signed   By: Everlean Alstrom M.D.   On: 04/29/2015 09:46       Assessment & Plan:   Problem List Items Addressed This Visit    Abnormal mammogram    Mammogram 04/2015.  Recommended f/u in 6 months.  Already scheduled.       CAD (coronary artery disease)    Followed by Dr Saralyn Pilar.  Stable.  Continue risk factor modification.       COPD (chronic obstructive pulmonary disease) ([place])    Followed by Dr Raul Del.  Increased cough and congestion as outlined.  Increased air movement.  Continue inhalers.  mucinex as directed.  rx given for prednisone if needed.  Follow.       Relevant Medications   predniSONE  (DELTASONE) 10 MG tablet   Hypercholesterolemia    Low cholesterol diet and exercise.  Follow lipid panel and liver function tests.  On lovastatin.        Relevant Orders   Lipid panel (Completed)   Hepatic function panel (Completed)   Hyperglycemia    Low carb diet and  exercise.  Follow met b and a1c.        Relevant Orders   Hemoglobin A1c (Completed)   Hypertension - Primary    Blood pressure on outside checks - averaging 507-225 systolic readings.  Continue same medication regimen.  Follow pressures.  Follow metabolic panel.        Relevant Orders   Basic metabolic panel (Completed)   Osteoporosis    Continue calcium and vitamin D.  Continue weight bearing exercise.  Check vitamin D level.        Relevant Orders   VITAMIN D 25 Hydroxy (Vit-D Deficiency, Fractures) (Completed)   Polycythemia vera ([employer])    Sees hematology.  Stable.  See note.       Relevant Medications   predniSONE (DELTASONE) 10 MG tablet       Einar Pheasant, MD

## 2015-10-04 ENCOUNTER — Encounter: Payer: Self-pay | Admitting: Internal Medicine

## 2015-10-04 ENCOUNTER — Telehealth: Payer: Self-pay | Admitting: *Deleted

## 2015-10-04 NOTE — Assessment & Plan Note (Signed)
Low cholesterol diet and exercise.  Follow lipid panel and liver function tests.  On lovastatin.   

## 2015-10-04 NOTE — Telephone Encounter (Signed)
Patient has requested lab results from 09/30/15 [phone]

## 2015-10-04 NOTE — Assessment & Plan Note (Signed)
Mammogram 04/2015.  Recommended f/u in 6 months.  Already scheduled.

## 2015-10-04 NOTE — Assessment & Plan Note (Signed)
Followed by Dr Paraschos.  Stable.  Continue risk factor modification.  

## 2015-10-04 NOTE — Assessment & Plan Note (Signed)
Low carb diet and exercise.  Follow met b and a1c.   

## 2015-10-04 NOTE — Assessment & Plan Note (Signed)
Blood pressure on outside checks - averaging [id number] systolic readings.  Continue same medication regimen.  Follow pressures.  Follow metabolic panel.

## 2015-10-04 NOTE — Assessment & Plan Note (Signed)
Sees hematology.  Stable.  See note.

## 2015-10-04 NOTE — Assessment & Plan Note (Signed)
Followed by Dr Raul Del.  Increased cough and congestion as outlined.  Increased air movement.  Continue inhalers.  mucinex as directed.  rx given for prednisone if needed.  Follow.

## 2015-10-04 NOTE — Telephone Encounter (Signed)
Patient informed of lab results. 

## 2015-10-04 NOTE — Assessment & Plan Note (Signed)
Continue calcium and vitamin D.  Continue weight bearing exercise.  Check vitamin D level.

## 2015-10-28 ENCOUNTER — Ambulatory Visit
Admission: RE | Admit: 2015-10-28 | Discharge: 2015-10-28 | Disposition: A | Discharge status: Home / Self Care | Payer: Medicare Other | Source: Ambulatory Visit | Attending: Internal Medicine | Admitting: Internal Medicine

## 2015-10-28 ENCOUNTER — Other Ambulatory Visit: Payer: Self-pay | Admitting: Internal Medicine

## 2015-10-28 DIAGNOSIS — N63 Unspecified lump in breast: Secondary | ICD-10-CM | POA: Diagnosis not present

## 2015-10-28 DIAGNOSIS — R928 Other abnormal and inconclusive findings on diagnostic imaging of breast: Secondary | ICD-10-CM

## 2015-10-29 ENCOUNTER — Other Ambulatory Visit: Payer: Self-pay | Admitting: Internal Medicine

## 2015-10-29 DIAGNOSIS — R928 Other abnormal and inconclusive findings on diagnostic imaging of breast: Secondary | ICD-10-CM

## 2015-10-29 NOTE — Progress Notes (Signed)
Order placed for bilateral diagnostic mammogram and ultrasound.

## 2015-10-31 ENCOUNTER — Other Ambulatory Visit: Payer: Self-pay | Admitting: Internal Medicine

## 2015-11-10 DIAGNOSIS — Z955 Presence of coronary angioplasty implant and graft: Secondary | ICD-10-CM | POA: Diagnosis not present

## 2015-11-10 DIAGNOSIS — I10 Essential (primary) hypertension: Secondary | ICD-10-CM | POA: Diagnosis not present

## 2015-11-10 DIAGNOSIS — E7800 Pure hypercholesterolemia, unspecified: Secondary | ICD-10-CM | POA: Diagnosis not present

## 2015-11-10 DIAGNOSIS — R002 Palpitations: Secondary | ICD-10-CM | POA: Diagnosis not present

## 2015-11-17 ENCOUNTER — Telehealth: Payer: Self-pay | Admitting: *Deleted

## 2015-11-17 NOTE — Telephone Encounter (Signed)
Notified patient that annual lung cancer screening low dose CT scan is due. Patient is 81 years old, and asymptomatic, (no signs or symptoms of lung cancer). Patient denies illness that would prevent curative treatment for lung cancer if found. The patient is a former smoker, quit 2004, with a 45 pack year history. The shared decision making visit was done 11/13/13.  Patient is agreeable for CT scan being scheduled.

## 2015-11-22 ENCOUNTER — Other Ambulatory Visit: Payer: Self-pay | Admitting: Family Medicine

## 2015-11-22 DIAGNOSIS — Z87891 Personal history of nicotine dependence: Secondary | ICD-10-CM

## 2015-12-25 ENCOUNTER — Other Ambulatory Visit: Payer: Self-pay | Admitting: Internal Medicine

## 2015-12-31 ENCOUNTER — Telehealth: Payer: Self-pay | Admitting: *Deleted

## 2015-12-31 ENCOUNTER — Inpatient Hospital Stay: Payer: Medicare Other

## 2015-12-31 ENCOUNTER — Inpatient Hospital Stay: Payer: Medicare Other | Attending: Internal Medicine

## 2015-12-31 VITALS — BP 160/80 | HR 64 | Resp 18

## 2015-12-31 DIAGNOSIS — D45 Polycythemia vera: Secondary | ICD-10-CM | POA: Diagnosis not present

## 2015-12-31 LAB — CBC WITH DIFFERENTIAL/PLATELET
BASOS PCT: 0 %
Basophils Absolute: 0 10*3/uL (ref 0–0.1)
EOS ABS: 0.2 10*3/uL (ref 0–0.7)
EOS PCT: 2 %
HCT: 48.8 % — ABNORMAL HIGH (ref 35.0–47.0)
Hemoglobin: 17 g/dL — ABNORMAL HIGH (ref 12.0–16.0)
LYMPHS ABS: 1.7 10*3/uL (ref 1.0–3.6)
Lymphocytes Relative: 16 %
MCH: 30.4 pg (ref 26.0–34.0)
MCHC: 34.7 g/dL (ref 32.0–36.0)
MCV: 87.7 fL (ref 80.0–100.0)
MONOS PCT: 8 %
Monocytes Absolute: 0.9 10*3/uL (ref 0.2–0.9)
Neutro Abs: 7.7 10*3/uL — ABNORMAL HIGH (ref 1.4–6.5)
Neutrophils Relative %: 74 %
PLATELETS: 227 10*3/uL (ref 150–440)
RBC: 5.57 MIL/uL — ABNORMAL HIGH (ref 3.80–5.20)
RDW: 14.1 % (ref 11.5–14.5)
WBC: 10.6 10*3/uL (ref 3.6–11.0)

## 2016-01-10 ENCOUNTER — Telehealth: Payer: Self-pay | Admitting: *Deleted

## 2016-01-10 NOTE — Telephone Encounter (Signed)
Notified patient that due to age, her insurance no longer approves of lung cancer screening scans. Patient is instructed to notify PCP if she has any symptoms in the future. Patient verbalizes understanding.

## 2016-01-12 ENCOUNTER — Other Ambulatory Visit: Payer: Self-pay

## 2016-01-12 NOTE — Telephone Encounter (Signed)
error 

## 2016-01-29 ENCOUNTER — Other Ambulatory Visit: Payer: Self-pay | Admitting: Internal Medicine

## 2016-02-02 ENCOUNTER — Ambulatory Visit (INDEPENDENT_AMBULATORY_CARE_PROVIDER_SITE_OTHER): Payer: Medicare Other | Admitting: Internal Medicine

## 2016-02-02 ENCOUNTER — Encounter: Payer: Self-pay | Admitting: Internal Medicine

## 2016-02-02 DIAGNOSIS — Z23 Encounter for immunization: Secondary | ICD-10-CM | POA: Diagnosis not present

## 2016-02-02 DIAGNOSIS — E7800 Pure hypercholesterolemia, unspecified: Secondary | ICD-10-CM

## 2016-02-02 DIAGNOSIS — I2510 Atherosclerotic heart disease of native coronary artery without angina pectoris: Secondary | ICD-10-CM | POA: Diagnosis not present

## 2016-02-02 DIAGNOSIS — R928 Other abnormal and inconclusive findings on diagnostic imaging of breast: Secondary | ICD-10-CM

## 2016-02-02 DIAGNOSIS — I10 Essential (primary) hypertension: Secondary | ICD-10-CM

## 2016-02-02 DIAGNOSIS — D45 Polycythemia vera: Secondary | ICD-10-CM | POA: Diagnosis not present

## 2016-02-02 DIAGNOSIS — R739 Hyperglycemia, unspecified: Secondary | ICD-10-CM

## 2016-02-02 DIAGNOSIS — Z0000 Encounter for general adult medical examination without abnormal findings: Secondary | ICD-10-CM

## 2016-02-02 DIAGNOSIS — Z86010 Personal history of colonic polyps: Secondary | ICD-10-CM

## 2016-02-02 DIAGNOSIS — J449 Chronic obstructive pulmonary disease, unspecified: Secondary | ICD-10-CM

## 2016-02-02 MED ORDER — LOSARTAN POTASSIUM 50 MG PO TABS: 50.0000 mg | ORAL_TABLET | Freq: Every day | ORAL | 1 refills | Status: DC

## 2016-02-02 NOTE — Progress Notes (Signed)
[name], female   DOB: [birth year], 81 y.o.   MRN: [id number]   Subjective:    [name], female    DOB: Oct 30, 1934, 81 y.o.   MRN: [id number]  HPI  Patient with past history of CAD, COPD, GERD, hypertension and hypercholesterolemia.  She comes in today to follow up o these issues as well as for a complete physical exam.  Breathing is stable, actually improved.  Uses a neb q am.  Uses her inhalers at lunch and at night.  Neb in the afternoon.  No chest pain.  No acid reflux.  No abdominal pain or cramping.  Bowels stable.     Past Medical History:  Diagnosis Date  . Anemia   . Asthma   . BRCA positive   . CAD (coronary artery disease)   . COPD (chronic obstructive pulmonary disease) ([place])   . Emphysema lung ([place])   . GERD (gastroesophageal reflux disease)   . History of colon polyps   . Hypercholesterolemia   . Hyperglycemia   . Hypertension   . Osteoporosis   . Personal history of tobacco use, presenting hazards to health 11/25/2014  . Polycythemia vera(238.4)   . Renal cyst   . Skin cancer    Past Surgical History:  Procedure Laterality Date  . ANGIOPLASTY     coronary (x1)  . COLON SURGERY    . COLONOSCOPY WITH PROPOFOL N/A 03/02/2015   Procedure: COLONOSCOPY WITH PROPOFOL;  Surgeon: Lollie Sails, MD;  Location: [facility] ENDOSCOPY;  Service: Endoscopy;  Laterality: N/A;  . CORONARY ANGIOPLASTY    . CORONARY ARTERY BYPASS GRAFT    . SALPINGOOPHORECTOMY    . TONSILECTOMY/ADENOIDECTOMY WITH MYRINGOTOMY    . TUBAL LIGATION     Family History  Problem Relation Age of Onset  . Cirrhosis Father     died age 16  . Alcohol abuse Father   . Asthma Mother   . Congestive Heart Failure Mother   . Breast cancer Mother     2 (1/2 sisters)  . Osteoarthritis Mother   . Colon cancer Mother   . Lupus Sister   . Alcohol abuse Sister   . Ovarian cancer Sister   . Osteoporosis Sister   . Skin cancer Sister   . Breast cancer Cousin   . Breast  cancer Maternal Aunt    Social History   Social History  . Marital status: Divorced    Spouse name: N/A  . Number of children: 2  . Years of education: N/A   Social History Main Topics  . Smoking status: Former Smoker    Packs/day: 1.00    Years: 45.00    Types: Cigarettes    Quit date: 11/11/2003  . Smokeless tobacco: Never Used  . Alcohol use No     Comment: occasional  . Drug use: No  . Sexual activity: Not Asked   Other Topics Concern  . None   Social History Narrative  . None    Outpatient Encounter Prescriptions as of 02/02/2016  Medication Sig  . aspirin EC 81 MG tablet Take 81 mg by mouth daily.  . Calcium Carbonate-Vitamin D (CALCIUM-D) 600-400 MG-UNIT TABS Take 1 tablet by mouth daily.  . fluticasone (FLONASE) 50 MCG/ACT nasal spray Place 2 sprays into the nose daily.  Marland Kitchen ipratropium-albuterol (DUONEB) 0.5-2.5 (3) MG/3ML SOLN Take 3 mLs by nebulization every 6 (six) hours as needed (for shortness of breath).  . lovastatin (MEVACOR) 40 MG tablet Take  1 tablet by mouth  daily  . metoprolol tartrate (LOPRESSOR) 25 MG tablet Take one-half tablet by  mouth two times daily  . SYMBICORT 160-4.5 MCG/ACT inhaler Use 2 puffs two times daily  . triamterene-hydrochlorothiazide (MAXZIDE-25) 37.5-25 MG tablet Take 1 tablet by mouth  daily  . [DISCONTINUED] predniSONE (DELTASONE) 10 MG tablet Take 6 tablets x 1 day and then decrease by 1/2 tablet per day until down to zero mg.  . losartan (COZAAR) 50 MG tablet Take 1 tablet (50 mg total) by mouth daily.   No facility-administered encounter medications on file as of 02/02/2016.     Review of Systems  Constitutional: Negative for appetite change and unexpected weight change.  HENT: Negative for congestion and sinus pressure.   Eyes: Negative for pain and visual disturbance.  Respiratory: Negative for cough, chest tightness and shortness of breath.   Cardiovascular: Negative for chest pain, palpitations and leg swelling.    Gastrointestinal: Negative for abdominal pain, diarrhea, nausea and vomiting.  Genitourinary: Negative for difficulty urinating and dysuria.  Musculoskeletal: Negative for back pain and joint swelling.  Skin: Negative for color change and rash.  Neurological: Negative for dizziness, light-headedness and headaches.  Hematological: Negative for adenopathy. Does not bruise/bleed easily.  Psychiatric/Behavioral: Negative for agitation and dysphoric mood.       Objective:    Physical Exam  Constitutional: She is oriented to person, place, and time. She appears well-developed and well-nourished. No distress.  HENT:  Nose: Nose normal.  Mouth/Throat: Oropharynx is clear and moist.  Eyes: Right eye exhibits no discharge. Left eye exhibits no discharge. No scleral icterus.  Neck: Neck supple. No thyromegaly present.  Cardiovascular: Normal rate and regular rhythm.   Pulmonary/Chest: Breath sounds normal. No accessory muscle usage. No tachypnea. No respiratory distress. She has no decreased breath sounds. She has no wheezes. She has no rhonchi. Right breast exhibits no inverted nipple, no mass, no nipple discharge and no tenderness (no axillary adenopathy). Left breast exhibits no inverted nipple, no mass, no nipple discharge and no tenderness (no axilarry adenopathy).  Abdominal: Soft. Bowel sounds are normal. There is no tenderness.  Musculoskeletal: She exhibits no edema or tenderness.  Lymphadenopathy:    She has no cervical adenopathy.  Neurological: She is alert and oriented to person, place, and time.  Skin: Skin is warm. No rash noted. No erythema.  Psychiatric: She has a normal mood and affect. Her behavior is normal.    BP (!) 160/70   Pulse (!) 56   Temp 98 F (36.7 C) (Oral)   Ht _0  (1.651 m)   Wt 132 lb 6.4 oz (60.1 kg)   SpO2 94%   BMI 22.03 kg/m  Wt Readings from Last 3 Encounters:  02/02/16 132 lb 6.4 oz (60.1 kg)  09/30/15 132 lb 4 oz (60 kg)  09/01/15 132 lb  11.5 oz (60.2 kg)     Lab Results  Component Value Date   WBC 10.6 12/31/2015   HGB 17.0 (H) 12/31/2015   HCT 48.8 (H) 12/31/2015   PLT 227 12/31/2015   GLUCOSE 120 (H) 09/30/2015   CHOL 173 09/30/2015   TRIG 93.0 09/30/2015   HDL 58.10 09/30/2015   LDLCALC 96 09/30/2015   ALT 12 09/30/2015   AST 16 09/30/2015   NA 139 09/30/2015   K 3.7 09/30/2015   CL 100 09/30/2015   CREATININE 1.13 09/30/2015   BUN 23 09/30/2015   CO2 32 09/30/2015   TSH 2.620 05/28/2015  HGBA1C 6.0 09/30/2015    Mm Diag Breast Tomo Uni Right  Result Date: 10/28/2015 CLINICAL DATA:  Right breast follow-up EXAM: 2D DIGITAL DIAGNOSTIC UNILATERAL RIGHT MAMMOGRAM WITH CAD AND ADJUNCT TOMO COMPARISON:  Previous exam(s). ACR Breast Density Category b: There are scattered areas of fibroglandular density. FINDINGS: The previously noted oval, circumscribed, 2-3 mm mass within the upper, outer right breast is unchanged from the December 2016 mammogram. No suspicious mass, calcifications, or other abnormality is identified within the right breast. Mammographic images were processed with CAD. IMPRESSION: Probably benign right breast mass. RECOMMENDATION: Bilateral diagnostic mammogram and possible ultrasound in 6 months. I have discussed the findings and recommendations with the patient. Results were also provided in writing at the conclusion of the visit. If applicable, a reminder letter will be sent to the patient regarding the next appointment. BI-RADS CATEGORY  3: Probably benign. Electronically Signed   By: Pamelia Hoit M.D.   On: 10/28/2015 11:46       Assessment & Plan:   Problem List Items Addressed This Visit    Abnormal mammogram    Mammogram 04/2015 recommended 6 month f/u.  F/u mammogram 10/28/15 recommended 6 month f/u.  F/u due 12.2017.        CAD (coronary artery disease)    Followed by Dr Saralyn Pilar.  Stable.  Continue risk factor modification.       Relevant Medications   losartan (COZAAR) 50 MG  tablet   COPD (chronic obstructive pulmonary disease) ([facility])    Followed by pulmonary.  Breathing better.  Follow.  Recommended f/u CT chest in one year per 09/2015 note.       Health care maintenance    Physical today 02/02/16.  Mammogram 10/28/15 - Birads III.  Recommended 6 month f/u and ultrasound in 6 months.  Colonoscopy 03/02/15 - f/u in 5 years.        History of colonic polyps    Colonoscopy 03/02/15 as outlined.  Recommend f/u colonoscopy in five years.        Hypercholesterolemia    Low cholesterol diet and exercise.  Follow lipid panel and liver function tests.  On lovastatin.        Relevant Medications   losartan (COZAAR) 50 MG tablet   Other Relevant Orders   Hepatic function panel   Lipid panel   Hyperglycemia    Low carb diet and exercise.  Follow met b and a1c.        Relevant Orders   Hemoglobin A1c   Hypertension    Blood pressure elevated.  Add losartan 75m q day.  Follow pressures.  Check metabolic panel in 128-78days.  Get her back in soon to reassess.        Relevant Medications   losartan (COZAAR) 50 MG tablet   Other Relevant Orders   Basic metabolic panel   Polycythemia vera ([place]    Followed by hematology.  Stable.         Other Visit Diagnoses    Encounter for immunization       Relevant Orders   Flu vaccine HIGH DOSE PF (Completed)       SEinar Pheasant MD

## 2016-02-02 NOTE — Progress Notes (Signed)
Pre visit review using our clinic review tool, if applicable. No additional management support is needed unless otherwise documented below in the visit note. 

## 2016-02-02 NOTE — Assessment & Plan Note (Addendum)
Physical today 02/02/16.  Mammogram 10/28/15 - Birads III.  Recommended 6 month f/u and ultrasound in 6 months.  Colonoscopy 03/02/15 - f/u in 5 years.

## 2016-02-06 ENCOUNTER — Encounter: Payer: Self-pay | Admitting: Internal Medicine

## 2016-02-06 NOTE — Assessment & Plan Note (Signed)
Low cholesterol diet and exercise.  Follow lipid panel and liver function tests.  On lovastatin.

## 2016-02-06 NOTE — Assessment & Plan Note (Signed)
Blood pressure elevated.  Add losartan 50mg  q day.  Follow pressures.  Check metabolic panel in 88-11 days.  Get her back in soon to reassess.

## 2016-02-06 NOTE — Assessment & Plan Note (Signed)
Followed by Dr Saralyn Pilar.  Stable.  Continue risk factor modification.

## 2016-02-06 NOTE — Assessment & Plan Note (Addendum)
Followed by pulmonary.  Breathing better.  Follow.  Recommended f/u CT chest in one year per 09/2015 note.

## 2016-02-06 NOTE — Assessment & Plan Note (Signed)
Low carb diet and exercise.  Follow met b and a1c.   

## 2016-02-06 NOTE — Assessment & Plan Note (Signed)
Followed by hematology. Stable.  

## 2016-02-06 NOTE — Assessment & Plan Note (Signed)
Mammogram 04/2015 recommended 6 month f/u.  F/u mammogram 10/28/15 recommended 6 month f/u.  F/u due 12.2017.

## 2016-02-06 NOTE — Assessment & Plan Note (Signed)
Colonoscopy 03/02/15 as outlined.  Recommend f/u colonoscopy in five years.

## 2016-02-16 ENCOUNTER — Other Ambulatory Visit (INDEPENDENT_AMBULATORY_CARE_PROVIDER_SITE_OTHER): Payer: Medicare Other

## 2016-02-16 DIAGNOSIS — E7800 Pure hypercholesterolemia, unspecified: Secondary | ICD-10-CM

## 2016-02-16 DIAGNOSIS — I10 Essential (primary) hypertension: Secondary | ICD-10-CM | POA: Diagnosis not present

## 2016-02-16 DIAGNOSIS — R739 Hyperglycemia, unspecified: Secondary | ICD-10-CM | POA: Diagnosis not present

## 2016-02-16 LAB — BASIC METABOLIC PANEL
BUN: 26 mg/dL — AB (ref 6–23)
CALCIUM: 9.8 mg/dL (ref 8.4–10.5)
CO2: 33 mEq/L — ABNORMAL HIGH (ref 19–32)
CREATININE: 1.26 mg/dL — AB (ref 0.40–1.20)
Chloride: 98 mEq/L (ref 96–112)
GFR: 43.52 mL/min — ABNORMAL LOW (ref >60.00)
Glucose, Bld: 116 mg/dL — ABNORMAL HIGH (ref 70–99)
Potassium: 4 mEq/L (ref 3.5–5.1)
Sodium: 139 mEq/L (ref 135–145)

## 2016-02-16 LAB — HEPATIC FUNCTION PANEL
ALT: 13 U/L (ref 0–35)
AST: 17 U/L (ref 0–37)
Albumin: 4.1 g/dL (ref 3.5–5.2)
Alkaline Phosphatase: 51 U/L (ref 39–117)
BILIRUBIN DIRECT: 0.1 mg/dL (ref 0.0–0.3)
BILIRUBIN TOTAL: 0.9 mg/dL (ref 0.2–1.2)
Total Protein: 7 g/dL (ref 6.0–8.3)

## 2016-02-16 LAB — LIPID PANEL
Cholesterol: 158 mg/dL (ref 0–200)
HDL: 60.1 mg/dL (ref >39.00)
LDL CALC: 81 mg/dL (ref 0–99)
NONHDL: 97.94
TRIGLYCERIDES: 86 mg/dL (ref 0.0–149.0)
Total CHOL/HDL Ratio: 3
VLDL: 17.2 mg/dL (ref 0.0–40.0)

## 2016-02-16 LAB — HEMOGLOBIN A1C: Hgb A1c MFr Bld: 5.7 % (ref 4.6–6.5)

## 2016-02-18 ENCOUNTER — Telehealth: Payer: Self-pay | Admitting: Internal Medicine

## 2016-02-18 DIAGNOSIS — R7989 Other specified abnormal findings of blood chemistry: Secondary | ICD-10-CM

## 2016-02-18 NOTE — Telephone Encounter (Signed)
Order placed for f/u met b to be drawn at [facility].

## 2016-02-24 DIAGNOSIS — R7989 Other specified abnormal findings of blood chemistry: Secondary | ICD-10-CM | POA: Diagnosis not present

## 2016-02-25 ENCOUNTER — Other Ambulatory Visit: Payer: Self-pay | Admitting: Internal Medicine

## 2016-02-25 ENCOUNTER — Telehealth: Payer: Self-pay | Admitting: Internal Medicine

## 2016-02-25 DIAGNOSIS — R7989 Other specified abnormal findings of blood chemistry: Secondary | ICD-10-CM

## 2016-02-25 LAB — BASIC METABOLIC PANEL
BUN/Creatinine Ratio: 22 (ref 12–28)
BUN: 30 mg/dL — ABNORMAL HIGH (ref 8–27)
CO2: 27 mmol/L (ref 18–29)
Calcium: 10 mg/dL (ref 8.7–10.3)
Chloride: 96 mmol/L (ref 96–106)
Creatinine, Ser: 1.34 mg/dL — ABNORMAL HIGH (ref 0.57–1.00)
GFR, EST AFRICAN AMERICAN: 43 mL/min/{1.73_m2} — AB (ref >59)
GFR, EST NON AFRICAN AMERICAN: 38 mL/min/{1.73_m2} — AB (ref >59)
Glucose: 112 mg/dL — ABNORMAL HIGH (ref 65–99)
POTASSIUM: 3.7 mmol/L (ref 3.5–5.2)
SODIUM: 138 mmol/L (ref 134–144)

## 2016-02-25 NOTE — Progress Notes (Signed)
Order placed for f/u labs.  

## 2016-02-25 NOTE — Telephone Encounter (Signed)
Order placed for f/u met b to be drawn at [facility].

## 2016-02-28 ENCOUNTER — Other Ambulatory Visit: Payer: Self-pay | Admitting: Internal Medicine

## 2016-03-03 DIAGNOSIS — R7989 Other specified abnormal findings of blood chemistry: Secondary | ICD-10-CM | POA: Diagnosis not present

## 2016-03-04 LAB — BASIC METABOLIC PANEL
BUN / CREAT RATIO: 20 (ref 12–28)
BUN: 23 mg/dL (ref 8–27)
CHLORIDE: 98 mmol/L (ref 96–106)
CO2: 28 mmol/L (ref 18–29)
CREATININE: 1.15 mg/dL — AB (ref 0.57–1.00)
Calcium: 10.3 mg/dL (ref 8.7–10.3)
GFR calc Af Amer: 52 mL/min/{1.73_m2} — ABNORMAL LOW (ref >59)
GFR calc non Af Amer: 45 mL/min/{1.73_m2} — ABNORMAL LOW (ref >59)
GLUCOSE: 113 mg/dL — AB (ref 65–99)
Potassium: 4.5 mmol/L (ref 3.5–5.2)
SODIUM: 142 mmol/L (ref 134–144)

## 2016-03-07 ENCOUNTER — Ambulatory Visit (INDEPENDENT_AMBULATORY_CARE_PROVIDER_SITE_OTHER): Payer: Medicare Other | Admitting: Internal Medicine

## 2016-03-07 ENCOUNTER — Encounter: Payer: Self-pay | Admitting: Internal Medicine

## 2016-03-07 DIAGNOSIS — N281 Cyst of kidney, acquired: Secondary | ICD-10-CM

## 2016-03-07 DIAGNOSIS — R739 Hyperglycemia, unspecified: Secondary | ICD-10-CM | POA: Diagnosis not present

## 2016-03-07 DIAGNOSIS — D45 Polycythemia vera: Secondary | ICD-10-CM

## 2016-03-07 DIAGNOSIS — J449 Chronic obstructive pulmonary disease, unspecified: Secondary | ICD-10-CM

## 2016-03-07 DIAGNOSIS — E7800 Pure hypercholesterolemia, unspecified: Secondary | ICD-10-CM

## 2016-03-07 DIAGNOSIS — I10 Essential (primary) hypertension: Secondary | ICD-10-CM | POA: Diagnosis not present

## 2016-03-07 DIAGNOSIS — R928 Other abnormal and inconclusive findings on diagnostic imaging of breast: Secondary | ICD-10-CM

## 2016-03-07 DIAGNOSIS — I2510 Atherosclerotic heart disease of native coronary artery without angina pectoris: Secondary | ICD-10-CM | POA: Diagnosis not present

## 2016-03-07 MED ORDER — LOSARTAN POTASSIUM 100 MG PO TABS: 100.0000 mg | ORAL_TABLET | Freq: Every day | ORAL | 1 refills | Status: DC

## 2016-03-07 MED ORDER — PREDNISONE 10 MG PO TABS: ORAL_TABLET | ORAL | 0 refills | Status: DC

## 2016-03-07 NOTE — Patient Instructions (Signed)
Increase losartan to 100mg per day 

## 2016-03-07 NOTE — Progress Notes (Signed)
Pre visit review using our clinic review tool, if applicable. No additional management support is needed unless otherwise documented below in the visit note. 

## 2016-03-07 NOTE — Progress Notes (Signed)
[name], female   DOB: 03-19-1935, 81 y.o.   MRN: [id number]   Subjective:    [name], female    DOB: 06/11/1934, 81 y.o.   MRN: [id number]  HPI  Patient here for a scheduled follow up.  She is here to f/u on her blood pressure.  Was started on losartan last visit.  Elevated creatinine.  Triamterene/hctz - decreased to 1/2.  Does report increased congestion - noticed mostly in her throat.  Feels a little full in her chest.  Some cough.  No acid reflux.  No abdominal pain or cramping.  Bowels stable.  Blood pressure averaging 094M-768 systolic.     Past Medical History:  Diagnosis Date  . Anemia   . Asthma   . BRCA positive   . CAD (coronary artery disease)   . COPD (chronic obstructive pulmonary disease) ([place])   . Emphysema lung ([place])   . GERD (gastroesophageal reflux disease)   . History of colon polyps   . Hypercholesterolemia   . Hyperglycemia   . Hypertension   . Osteoporosis   . Personal history of tobacco use, presenting hazards to health 11/25/2014  . Polycythemia vera(238.4)   . Renal cyst   . Skin cancer    Past Surgical History:  Procedure Laterality Date  . ANGIOPLASTY     coronary (x1)  . COLON SURGERY    . COLONOSCOPY WITH PROPOFOL N/A 03/02/2015   Procedure: COLONOSCOPY WITH PROPOFOL;  Surgeon: Lollie Sails, MD;  Location: [facility]OSCOPY;  Service: Endoscopy;  Laterality: N/A;  . CORONARY ANGIOPLASTY    . CORONARY ARTERY BYPASS GRAFT    . SALPINGOOPHORECTOMY    . TONSILECTOMY/ADENOIDECTOMY WITH MYRINGOTOMY    . TUBAL LIGATION     Family History  Problem Relation Age of Onset  . Cirrhosis Father     died age 18  . Alcohol abuse Father   . Asthma Mother   . Congestive Heart Failure Mother   . Breast cancer Mother     2 (1/2 sisters)  . Osteoarthritis Mother   . Colon cancer Mother   . Lupus Sister   . Alcohol abuse Sister   . Ovarian cancer Sister   . Osteoporosis Sister   . Skin cancer Sister   . Breast  cancer Cousin   . Breast cancer Maternal Aunt    Social History   Social History  . Marital status: Divorced    Spouse name: N/A  . Number of children: 2  . Years of education: N/A   Social History Main Topics  . Smoking status: Former Smoker    Packs/day: 1.00    Years: 45.00    Types: Cigarettes    Quit date: 11/11/2003  . Smokeless tobacco: Never Used  . Alcohol use No     Comment: occasional  . Drug use: No  . Sexual activity: Not Asked   Other Topics Concern  . None   Social History Narrative  . None    Outpatient Encounter Prescriptions as of 03/07/2016  Medication Sig  . aspirin EC 81 MG tablet Take 81 mg by mouth daily.  . Calcium Carbonate-Vitamin D (CALCIUM-D) 600-400 MG-UNIT TABS Take 1 tablet by mouth daily.  . cholecalciferol (VITAMIN D) 1000 units tablet Take 1,000 Units by mouth daily.  . fluticasone (FLONASE) 50 MCG/ACT nasal spray Place 2 sprays into the nose daily.  Marland Kitchen ipratropium-albuterol (DUONEB) 0.5-2.5 (3) MG/3ML SOLN Take 3 mLs by nebulization every 6 (six)  hours as needed (for shortness of breath).  . lovastatin (MEVACOR) 40 MG tablet Take 1 tablet by mouth  daily  . metoprolol tartrate (LOPRESSOR) 25 MG tablet Take one-half tablet by  mouth two times daily  . SYMBICORT 160-4.5 MCG/ACT inhaler Use 2 puffs two times daily  . triamterene-hydrochlorothiazide (MAXZIDE-25) 37.5-25 MG tablet Take 1 tablet by mouth  daily  . [DISCONTINUED] losartan (COZAAR) 50 MG tablet TAKE ONE TABLET BY MOUTH EVERY DAY  . losartan (COZAAR) 100 MG tablet Take 1 tablet (100 mg total) by mouth daily.  . predniSONE (DELTASONE) 10 MG tablet Take 6 tablets x 1 day and then decrease by 1/2 tablet per day until down to zero mg.   No facility-administered encounter medications on file as of 03/07/2016.     Review of Systems  Constitutional: Negative for appetite change and unexpected weight change.  HENT: Positive for congestion and postnasal drip. Negative for sinus  pressure.   Respiratory: Positive for cough.        Chest feels a little full and heavy.    Cardiovascular: Negative for chest pain, palpitations and leg swelling.  Gastrointestinal: Negative for abdominal pain, diarrhea, nausea and vomiting.  Genitourinary: Negative for difficulty urinating and dysuria.  Musculoskeletal: Negative for back pain and joint swelling.  Skin: Negative for color change and rash.  Neurological: Negative for dizziness, light-headedness and headaches.  Psychiatric/Behavioral: Negative for agitation and dysphoric mood.       Objective:     Blood pressure rechecked by me:  158/78  Physical Exam  Constitutional: She appears well-developed and well-nourished. No distress.  HENT:  Nose: Nose normal.  Mouth/Throat: Oropharynx is clear and moist.  Neck: Neck supple. No thyromegaly present.  Cardiovascular: Normal rate and regular rhythm.   Pulmonary/Chest: Breath sounds normal. No respiratory distress.  Increased cough with forced expiration.    Abdominal: Soft. Bowel sounds are normal. There is no tenderness.  Audible abdominal bruit.    Musculoskeletal: She exhibits no edema or tenderness.  Lymphadenopathy:    She has no cervical adenopathy.  Skin: No rash noted. No erythema.  Psychiatric: She has a normal mood and affect. Her behavior is normal.    BP (!) 160/78   Pulse (!) 57   Temp 98 F (36.7 C) (Oral)   Ht _0  (1.651 m)   Wt 134 lb 6.4 oz (61 kg)   SpO2 97%   BMI 22.37 kg/m  Wt Readings from Last 3 Encounters:  03/07/16 134 lb 6.4 oz (61 kg)  02/02/16 132 lb 6.4 oz (60.1 kg)  09/30/15 132 lb 4 oz (60 kg)     Lab Results  Component Value Date   WBC 10.6 12/31/2015   HGB 17.0 (H) 12/31/2015   HCT 48.8 (H) 12/31/2015   PLT 227 12/31/2015   GLUCOSE 113 (H) 03/03/2016   CHOL 158 02/16/2016   TRIG 86.0 02/16/2016   HDL 60.10 02/16/2016   LDLCALC 81 02/16/2016   ALT 13 02/16/2016   AST 17 02/16/2016   NA 142 03/03/2016   K 4.5  03/03/2016   CL 98 03/03/2016   CREATININE 1.15 (H) 03/03/2016   BUN 23 03/03/2016   CO2 28 03/03/2016   TSH 2.620 05/28/2015   HGBA1C 5.7 02/16/2016    Mm Diag Breast Tomo Uni Right  Result Date: 10/28/2015 CLINICAL DATA:  Right breast follow-up EXAM: 2D DIGITAL DIAGNOSTIC UNILATERAL RIGHT MAMMOGRAM WITH CAD AND ADJUNCT TOMO COMPARISON:  Previous exam(s). ACR Breast Density Category b: There  are scattered areas of fibroglandular density. FINDINGS: The previously noted oval, circumscribed, 2-3 mm mass within the upper, outer right breast is unchanged from the December 2016 mammogram. No suspicious mass, calcifications, or other abnormality is identified within the right breast. Mammographic images were processed with CAD. IMPRESSION: Probably benign right breast mass. RECOMMENDATION: Bilateral diagnostic mammogram and possible ultrasound in 6 months. I have discussed the findings and recommendations with the patient. Results were also provided in writing at the conclusion of the visit. If applicable, a reminder letter will be sent to the patient regarding the next appointment. BI-RADS CATEGORY  3: Probably benign. Electronically Signed   By: Pamelia Hoit M.D.   On: 10/28/2015 11:46       Assessment & Plan:   Problem List Items Addressed This Visit    Abnormal mammogram    Mammogram 10/2015.  Recommended f/u in 6 months.  Scheduled for 04/2016.       CAD (coronary artery disease)    Followed by Dr Saralyn Pilar.  Continue risk factor modification.       Relevant Medications   losartan (COZAAR) 100 MG tablet   COPD (chronic obstructive pulmonary disease) (H[facility])    Treat acute flare.  Treat with prednisone taper.  Do not feel abx warranted.  Continue inhalers.  Follow.        Relevant Medications   predniSONE (DELTASONE) 10 MG tablet   Hypercholesterolemia    Low cholesterol diet and exercise.  Follow lipid panel and liver function tests.  On lovastatin.        Relevant Medications    losartan (COZAAR) 100 MG tablet   Hyperglycemia    Low carb diet and exercise.  Follow met b and a1c.       Hypertension    Blood pressure remains elevated.  Increase losartan to 117m q day.  Audible bruit.  Need to obtain aortic ultrasound.  Also due f/u renal ultrasound per pt.  See if can obtain at the same time.  Evaluate kidneys for possible renal artery stenosis.        Relevant Medications   losartan (COZAAR) 100 MG tablet   Other Relevant Orders   Basic metabolic panel   [place]    Followed by hematology.       Relevant Medications   predniSONE (DELTASONE) 10 MG tablet   Renal cyst    Seeing urology.  States due soon for f/u renal ultrasound.         Other Visit Diagnoses   None.      SEinar Pheasant MD

## 2016-03-12 ENCOUNTER — Encounter: Payer: Self-pay | Admitting: Internal Medicine

## 2016-03-12 NOTE — Assessment & Plan Note (Signed)
Seeing urology.  States due soon for f/u renal ultrasound.

## 2016-03-12 NOTE — Assessment & Plan Note (Signed)
Followed by hematology 

## 2016-03-12 NOTE — Assessment & Plan Note (Signed)
Low carb diet and exercise.  Follow met b and a1c.  

## 2016-03-12 NOTE — Assessment & Plan Note (Signed)
Followed by Dr Saralyn Pilar.  Continue risk factor modification.

## 2016-03-12 NOTE — Assessment & Plan Note (Signed)
Low cholesterol diet and exercise.  Follow lipid panel and liver function tests.  On lovastatin.

## 2016-03-12 NOTE — Assessment & Plan Note (Signed)
Treat acute flare.  Treat with prednisone taper.  Do not feel abx warranted.  Continue inhalers.  Follow.

## 2016-03-12 NOTE — Assessment & Plan Note (Signed)
Blood pressure remains elevated.  Increase losartan to 100mg  q day.  Audible bruit.  Need to obtain aortic ultrasound.  Also due f/u renal ultrasound per pt.  See if can obtain at the same time.  Evaluate kidneys for possible renal artery stenosis.

## 2016-03-12 NOTE — Assessment & Plan Note (Signed)
Mammogram 10/2015.  Recommended f/u in 6 months.  Scheduled for 04/2016.

## 2016-03-17 ENCOUNTER — Other Ambulatory Visit: Payer: Medicare Other

## 2016-03-22 DIAGNOSIS — J449 Chronic obstructive pulmonary disease, unspecified: Secondary | ICD-10-CM | POA: Diagnosis not present

## 2016-03-23 ENCOUNTER — Telehealth: Payer: Self-pay | Admitting: Internal Medicine

## 2016-03-23 DIAGNOSIS — I10 Essential (primary) hypertension: Secondary | ICD-10-CM

## 2016-03-23 NOTE — Telephone Encounter (Signed)
Order placed for lab to be drawn at [facility]

## 2016-03-23 NOTE — Telephone Encounter (Signed)
I have placed the order for the lab to be drawn at [facility]

## 2016-03-23 NOTE — Telephone Encounter (Signed)
Pt called and stated that she needs to have her lab orders sent over to [facility]. Thank you!  Call pt @ [phone]

## 2016-03-23 NOTE — Telephone Encounter (Signed)
Patient was last seen on 10/24 for a OV and you ordered a BMP.  Looks like a follow up to the two prior BMP that were done on 10/12 and 10/20.  Patient is requesting it to be done at [facility].  Please advise, it is currently under clinic collect. thanks

## 2016-03-24 NOTE — Telephone Encounter (Signed)
Patient advised and will go Monday for labs .

## 2016-03-27 DIAGNOSIS — I10 Essential (primary) hypertension: Secondary | ICD-10-CM | POA: Diagnosis not present

## 2016-03-28 LAB — BASIC METABOLIC PANEL
BUN / CREAT RATIO: 26 (ref 12–28)
BUN: 29 mg/dL — AB (ref 8–27)
CHLORIDE: 99 mmol/L (ref 96–106)
CO2: 27 mmol/L (ref 18–29)
Calcium: 9.5 mg/dL (ref 8.7–10.3)
Creatinine, Ser: 1.13 mg/dL — ABNORMAL HIGH (ref 0.57–1.00)
GFR calc non Af Amer: 46 mL/min/{1.73_m2} — ABNORMAL LOW (ref >59)
GFR, EST AFRICAN AMERICAN: 53 mL/min/{1.73_m2} — AB (ref >59)
GLUCOSE: 115 mg/dL — AB (ref 65–99)
POTASSIUM: 3.6 mmol/L (ref 3.5–5.2)
SODIUM: 143 mmol/L (ref 134–144)

## 2016-03-29 ENCOUNTER — Ambulatory Visit (INDEPENDENT_AMBULATORY_CARE_PROVIDER_SITE_OTHER): Payer: Medicare Other

## 2016-03-29 VITALS — BP 142/78 | HR 69 | Temp 98.2°F | Resp 14 | Ht 64.75 in | Wt 136.4 lb

## 2016-03-29 DIAGNOSIS — Z0000 Encounter for general adult medical examination without abnormal findings: Secondary | ICD-10-CM | POA: Diagnosis not present

## 2016-03-29 DIAGNOSIS — Z23 Encounter for immunization: Secondary | ICD-10-CM

## 2016-03-29 NOTE — Progress Notes (Signed)
Subjective:   Tina [name] is a 81 y.o. female who presents for an Initial Medicare Annual Wellness Visit.  Review of Systems    No ROS.  Medicare Wellness Visit.  Cardiac Risk Factors include: advanced age (>74mn, >>4women);hypertension     Objective:    Today's Vitals   03/29/16 1512  BP: (!) 142/78  Pulse: 69  Resp: 14  Temp: 98.2 F (36.8 C)  TempSrc: Oral  Weight: 136 lb 6.4 oz (61.9 kg)  Height: 5' 4.75" (1.645 m)   Body mass index is 22.87 kg/m.   Current Medications (verified) Outpatient Encounter Prescriptions as of 03/29/2016  Medication Sig  . aspirin EC 81 MG tablet Take 81 mg by mouth daily.  . Calcium Carbonate-Vitamin D (CALCIUM-D) 600-400 MG-UNIT TABS Take 1 tablet by mouth daily.  . cholecalciferol (VITAMIN D) 1000 units tablet Take 1,000 Units by mouth daily.  . fluticasone (FLONASE) 50 MCG/ACT nasal spray Place 2 sprays into the nose daily.  .Marland Kitchenipratropium-albuterol (DUONEB) 0.5-2.5 (3) MG/3ML SOLN Take 3 mLs by nebulization every 6 (six) hours as needed (for shortness of breath).  . losartan (COZAAR) 100 MG tablet Take 1 tablet (100 mg total) by mouth daily.  .Marland Kitchenlovastatin (MEVACOR) 40 MG tablet Take 1 tablet by mouth  daily  . metoprolol tartrate (LOPRESSOR) 25 MG tablet Take one-half tablet by  mouth two times daily  . SYMBICORT 160-4.5 MCG/ACT inhaler Use 2 puffs two times daily  . triamterene-hydrochlorothiazide (MAXZIDE-25) 37.5-25 MG tablet Take 1 tablet by mouth  daily  . [DISCONTINUED] predniSONE (DELTASONE) 10 MG tablet Take 6 tablets x 1 day and then decrease by 1/2 tablet per day until down to zero mg.   No facility-administered encounter medications on file as of 03/29/2016.     Allergies (verified) Evista [raloxifene] and Fluticasone-salmeterol   History: Past Medical History:  Diagnosis Date  . Anemia   . Asthma   . BRCA positive   . CAD (coronary artery disease)   . COPD [place] disease) ([place]   .  [place] lung ([place]   . GERD (gastroesophageal reflux disease)   . History of colon polyps   . Hypercholesterolemia   . Hyperglycemia   . Hypertension   . Osteoporosis   . Personal history of tobacco use, presenting hazards to health 11/25/2014  . Polycythemia vera(238.4)   . Renal cyst   . Skin cancer    Past Surgical History:  Procedure Laterality Date  . ANGIOPLASTY     coronary (x1)  . COLON SURGERY    . COLONOSCOPY WITH PROPOFOL N/A 03/02/2015   Procedure: COLONOSCOPY WITH PROPOFOL;  Surgeon: MLollie Sails MD;  [facility];  Service: Endoscopy;  Laterality: N/A;  . CORONARY ANGIOPLASTY    . CORONARY ARTERY BYPASS GRAFT    . SALPINGOOPHORECTOMY    . TONSILECTOMY/ADENOIDECTOMY WITH MYRINGOTOMY    . TUBAL LIGATION     Family History  Problem Relation Age of Onset  . Cirrhosis Father     died age 79 . Alcohol abuse Father   . Asthma Mother   . Congestive Heart Failure Mother   . Breast cancer Mother     2 (1/2 sisters)  . Osteoarthritis Mother   . Colon cancer Mother   . Lupus Sister   . Alcohol abuse Sister   . Ovarian cancer Sister   . Osteoporosis Sister   . Skin cancer Sister   . Breast cancer Cousin   . Breast  cancer Maternal Aunt    Social History   Occupational History  . Not on file.   Social History Main Topics  . Smoking status: Former Smoker    Packs/day: 1.00    Years: 45.00    Types: Cigarettes    Quit date: 11/11/2003  . Smokeless tobacco: Never Used  . Alcohol use No     Comment: occasional  . Drug use: No  . Sexual activity: Not on file    Tobacco Counseling Counseling given: Not Answered   Activities of Daily Living In your present state of health, do you have any difficulty performing the following activities: 03/29/2016  Hearing? N  Vision? N  Difficulty concentrating or making decisions? N  Walking or climbing stairs? Y  Dressing or bathing? N  Doing errands, shopping? N  Preparing Food and eating ? N    Using the Toilet? N  In the past six months, have you accidently leaked urine? N  Do you have problems with loss of bowel control? N  Managing your Medications? N  Managing your Finances? N  Housekeeping or managing your Housekeeping? N  Some recent data might be hidden    Immunizations and Health Maintenance Immunization History  Administered Date(s) Administered  . Influenza Split 02/03/2013, 01/16/2014  . Influenza, High Dose Seasonal PF 02/02/2016  . Influenza,inj,Quad PF,36+ Mos 01/19/2015  . Pneumococcal Conjugate-13 02/05/2014  . Pneumococcal Polysaccharide-23 03/29/2016  . Zoster 08/06/2014   Health Maintenance Due  Topic Date Due  . TETANUS/TDAP  12/06/1955  . MAMMOGRAM  10/11/2015    Patient Care Team: Einar Pheasant, MD as PCP - General (Internal Medicine)  Indicate any recent Medical Services you may have received from other than [facility] providers in the past year (date may be approximate).     Assessment:   This is a routine wellness examination for Tina [name]. The goal of the wellness visit is to assist the patient how to close the gaps in care and create a preventative care plan for the patient.   Taking calcium VIT D as appropriate/Osteoporosis reviewed.  Medications reviewed; taking without issues or barriers.  Safety issues reviewed; lives with sister and son. Alarm system with smoke and carbon monoxide detectors in the home. No firearms in the home. Wears seatbelts when driving or riding with others. No violence in the home.  No identified risk were noted; The patient was oriented x 3; appropriate in dress and manner and no objective failures at ADL's or IADL's.   BMI; discussed the importance of a healthy diet, water intake and exercise. Educational material provided.  HTN; followed by PCP.  Pneumococcal 23 vaccine administered R deltoid, tolerated well.  Patient Concerns: None at this time. Follow up with PCP as needed.  Hearing/Vision  screen Hearing Screening Comments: Passes the whisper test Vision Screening Comments: Followed by [facility] Wears glasses  Cataracts extracted, bilateral Last OV 2016  Dietary issues and exercise activities discussed: Current Exercise Habits: Structured exercise class (line dancing), Type of exercise: calisthenics, Time (Minutes): 60, Frequency (Times/Week): 2, Weekly Exercise (Minutes/Week): 120, Intensity: Mild  Goals    . Healthy Lifestyle          Stay hydrated and drink plenty of water Stay active and continue dancing for exercise. Low carb foods.  Vegetables, lean meats.      Depression Screen PHQ 2/9 Scores 03/07/2016 02/02/2016 08/11/2014 08/07/2013 05/23/2012  PHQ - 2 Score 0 0 0 0 0    Fall Risk Fall Risk  03/07/2016  02/02/2016 08/11/2014 08/07/2013 05/23/2012  Falls in the past year? No No No No No    Cognitive Function:     6CIT Screen 03/29/2016  What Year? 0 points  What month? 0 points  What time? 0 points  Count back from 20 0 points  Months in reverse 0 points    Screening Tests Health Maintenance  Topic Date Due  . TETANUS/TDAP  12[birth year]06/1955  . MAMMOGRAM  10/11/2015  . COLONOSCOPY  03/01/2020  . INFLUENZA VACCINE  Completed  . DEXA SCAN  Completed  . ZOSTAVAX  Completed  . PNA vac Low Risk Adult  Completed      Plan:    End of life planning; Advance aging; Advanced directives discussed. No HCPOA/Living Will. Additional information declined at this time.  Follow up with PCP as needed.  Medicare Attestation I have personally reviewed: The patient's medical and social history Their use of alcohol, tobacco or illicit drugs Their current medications and supplements The patient's functional ability including ADLs,fall risks, home safety risks, cognitive, and hearing and visual impairment Diet and physical activities Evidence for depression   The patient's weight, height, BMI, and visual acuity have been recorded in the chart.  I have made referrals  and provided education to the patient based on review of the above and I have provided the patient with a written personalized care plan for preventive services.    During the course of the visit, Tina [name] was educated and counseled about the following appropriate screening and preventive services:   Vaccines to include Pneumoccal, Influenza, Hepatitis B, Td, Zostavax, HCV  Electrocardiogram  Cardiovascular disease screening  Colorectal cancer screening  Bone density screening  Diabetes screening  Glaucoma screening  Mammography/PAP  Nutrition counseling  Smoking cessation counseling  Patient Instructions (the written plan) were given to the patient.    Varney Biles, LPN   [id number]    Reviewed above information.  Agree with assessment and plan.    Dr Nicki Reaper

## 2016-03-29 NOTE — Patient Instructions (Addendum)
  Tina [name] , Thank you for taking time to come for your Medicare Wellness Visit. I appreciate your ongoing commitment to your health goals. Please review the following plan we discussed and let me know if I can assist you in the future.   These are the goals we discussed: Goals    . Healthy Lifestyle          Stay hydrated and drink plenty of water Stay active and continue dancing for exercise. Low carb foods.  Vegetables, lean meats.       This is a list of the screening recommended for you and due dates:  Health Maintenance  Topic Date Due  . Tetanus Vaccine  12[birth year]06/1955  . Mammogram  10/11/2015  . Colon Cancer Screening  03/01/2020  . Flu Shot  Completed  . DEXA scan (bone density measurement)  Completed  . Shingles Vaccine  Completed  . Pneumonia vaccines  Completed

## 2016-04-13 ENCOUNTER — Encounter: Payer: Self-pay | Admitting: Internal Medicine

## 2016-04-13 ENCOUNTER — Ambulatory Visit (INDEPENDENT_AMBULATORY_CARE_PROVIDER_SITE_OTHER): Payer: Medicare Other | Admitting: Internal Medicine

## 2016-04-13 VITALS — BP 156/84 | HR 79 | Temp 97.5°F | Ht 65.0 in | Wt 135.2 lb

## 2016-04-13 DIAGNOSIS — R0989 Other specified symptoms and signs involving the circulatory and respiratory systems: Secondary | ICD-10-CM

## 2016-04-13 DIAGNOSIS — D45 Polycythemia vera: Secondary | ICD-10-CM

## 2016-04-13 DIAGNOSIS — R739 Hyperglycemia, unspecified: Secondary | ICD-10-CM

## 2016-04-13 DIAGNOSIS — I2510 Atherosclerotic heart disease of native coronary artery without angina pectoris: Secondary | ICD-10-CM

## 2016-04-13 DIAGNOSIS — E7800 Pure hypercholesterolemia, unspecified: Secondary | ICD-10-CM

## 2016-04-13 DIAGNOSIS — I10 Essential (primary) hypertension: Secondary | ICD-10-CM

## 2016-04-13 DIAGNOSIS — N281 Cyst of kidney, acquired: Secondary | ICD-10-CM | POA: Diagnosis not present

## 2016-04-13 DIAGNOSIS — J449 Chronic obstructive pulmonary disease, unspecified: Secondary | ICD-10-CM

## 2016-04-13 LAB — BASIC METABOLIC PANEL
BUN: 19 mg/dL (ref 6–23)
CALCIUM: 10.1 mg/dL (ref 8.4–10.5)
CHLORIDE: 100 meq/L (ref 96–112)
CO2: 31 mEq/L (ref 19–32)
CREATININE: 1.08 mg/dL (ref 0.40–1.20)
GFR: 51.97 mL/min — ABNORMAL LOW (ref >60.00)
GLUCOSE: 109 mg/dL — AB (ref 70–99)
Potassium: 3.7 mEq/L (ref 3.5–5.1)
Sodium: 140 mEq/L (ref 135–145)

## 2016-04-13 MED ORDER — AMLODIPINE BESYLATE 5 MG PO TABS: 5.0000 mg | ORAL_TABLET | Freq: Every day | ORAL | 1 refills | Status: DC

## 2016-04-13 NOTE — Progress Notes (Signed)
[name], female   DOB: Mar 26, 1935, 81 y.o.   MRN: [id number]   Subjective:    [name], female    DOB: 09/17/1934, 81 y.o.   MRN: [id number]  HPI  Patient here for a scheduled follow up.  She reports her breathing is doing better.  No chest pain.  Has been working.  Does a lot of physical activity around her house and yard.  Still goes dancing. No problems doing her activity.  States her blood pressure is remaining elevated.  States systolic readings averaging 140-150 at home.  On losartan 177m q day.  Also taking 1/2 triam/hctz.  No nausea or vomiting.  Bowels stable.     Past Medical History:  Diagnosis Date  . Anemia   . Asthma   . BRCA positive   . CAD (coronary artery disease)   . COPD [place] obstructive pulmonary disease) ([place]   . Emphysema lung ([place]   . GERD (gastroesophageal reflux disease)   . History of colon polyps   . Hypercholesterolemia   . Hyperglycemia   . Hypertension   . Osteoporosis   . Personal history of tobacco use, presenting hazards to health 11/25/2014  . Polycythemia vera(238.4)   . Renal cyst   . Skin cancer    Past Surgical History:  Procedure Laterality Date  . ANGIOPLASTY     coronary (x1)  . COLON SURGERY    . COLONOSCOPY WITH PROPOFOL N/A 03/02/2015   Procedure: COLONOSCOPY WITH PROPOFOL;  Surgeon: MLollie Sails MD;  [facility];  Service: Endoscopy;  Laterality: N/A;  . CORONARY ANGIOPLASTY    . CORONARY ARTERY BYPASS GRAFT    . SALPINGOOPHORECTOMY    . TONSILECTOMY/ADENOIDECTOMY WITH MYRINGOTOMY    . TUBAL LIGATION     Family History  Problem Relation Age of Onset  . Cirrhosis Father     died age 79 . Alcohol abuse Father   . Asthma Mother   . Congestive Heart Failure Mother   . Breast cancer Mother     2 (1/2 sisters)  . Osteoarthritis Mother   . Colon cancer Mother   . Lupus Sister   . Alcohol abuse Sister   . Ovarian cancer Sister   . Osteoporosis Sister   . Skin  cancer Sister   . Breast cancer Cousin   . Breast cancer Maternal Aunt    Social History   Social History  . Marital status: Divorced    Spouse name: N/A  . Number of children: 2  . Years of education: N/A   Social History Main Topics  . Smoking status: Former Smoker    Packs/day: 1.00    Years: 45.00    Types: Cigarettes    Quit date: 11/11/2003  . Smokeless tobacco: Never Used  . Alcohol use No     Comment: occasional  . Drug use: No  . Sexual activity: Not Asked   Other Topics Concern  . None   Social History Narrative  . None    Outpatient Encounter Prescriptions as of 04/13/2016  Medication Sig  . aspirin EC 81 MG tablet Take 81 mg by mouth daily.  . Calcium Carbonate-Vitamin D (CALCIUM-D) 600-400 MG-UNIT TABS Take 1 tablet by mouth daily.  . cholecalciferol (VITAMIN D) 1000 units tablet Take 1,000 Units by mouth daily.  . fluticasone (FLONASE) 50 MCG/ACT nasal spray Place 2 sprays into the nose daily.  .Marland Kitchenipratropium-albuterol (DUONEB) 0.5-2.5 (3) MG/3ML SOLN Take 3 mLs  by nebulization every 6 (six) hours as needed (for shortness of breath).  . losartan (COZAAR) 100 MG tablet Take 1 tablet (100 mg total) by mouth daily.  Marland Kitchen lovastatin (MEVACOR) 40 MG tablet Take 1 tablet by mouth  daily  . metoprolol tartrate (LOPRESSOR) 25 MG tablet Take one-half tablet by  mouth two times daily  . SYMBICORT 160-4.5 MCG/ACT inhaler Use 2 puffs two times daily  . triamterene-hydrochlorothiazide (MAXZIDE-25) 37.5-25 MG tablet Take 1 tablet by mouth  daily  . amLODipine (NORVASC) 5 MG tablet Take 1 tablet (5 mg total) by mouth daily.   No facility-administered encounter medications on file as of 04/13/2016.     Review of Systems  Constitutional: Negative for appetite change and unexpected weight change.  HENT: Negative for congestion and sinus pressure.   Respiratory: Negative for cough, chest tightness and shortness of breath.   Cardiovascular: Negative for chest pain,  palpitations and leg swelling.  Gastrointestinal: Negative for abdominal pain, diarrhea, nausea and vomiting.  Genitourinary: Negative for difficulty urinating and dysuria.  Musculoskeletal: Negative for joint swelling and myalgias.  Skin: Negative for color change and rash.  Neurological: Negative for dizziness, light-headedness and headaches.  Psychiatric/Behavioral: Negative for agitation and dysphoric mood.       Objective:     Blood pressure rechecked by me:  156/84  Physical Exam  Constitutional: She appears well-developed and well-nourished. No distress.  HENT:  Nose: Nose normal.  Mouth/Throat: Oropharynx is clear and moist.  Neck: Neck supple. No thyromegaly present.  Cardiovascular: Normal rate and regular rhythm.   Abdominal bruit.   Pulmonary/Chest: Breath sounds normal. No respiratory distress. She has no wheezes.  Abdominal: Soft. Bowel sounds are normal. There is no tenderness.  Musculoskeletal: She exhibits no edema or tenderness.  Lymphadenopathy:    She has no cervical adenopathy.  Skin: No rash noted. No erythema.  Psychiatric: She has a normal mood and affect. Her behavior is normal.    BP (!) 156/84   Pulse 79   Temp 97.5 F (36.4 C) (Oral)   Ht _0  (1.651 m)   Wt 135 lb 3.2 oz (61.3 kg)   SpO2 95%   BMI 22.50 kg/m  Wt Readings from Last 3 Encounters:  04/13/16 135 lb 3.2 oz (61.3 kg)  03/29/16 136 lb 6.4 oz (61.9 kg)  03/07/16 134 lb 6.4 oz (61 kg)     Lab Results  Component Value Date   WBC 10.6 12/31/2015   HGB 17.0 (H) 12/31/2015   HCT 48.8 (H) 12/31/2015   PLT 227 12/31/2015   GLUCOSE 109 (H) 04/13/2016   CHOL 158 02/16/2016   TRIG 86.0 02/16/2016   HDL 60.10 02/16/2016   LDLCALC 81 02/16/2016   ALT 13 02/16/2016   AST 17 02/16/2016   NA 140 04/13/2016   K 3.7 04/13/2016   CL 100 04/13/2016   CREATININE 1.08 04/13/2016   BUN 19 04/13/2016   CO2 31 04/13/2016   TSH 2.620 05/28/2015   HGBA1C 5.7 02/16/2016    Mm Diag  Breast Tomo Uni Right  Result Date: 10/28/2015 CLINICAL DATA:  Right breast follow-up EXAM: 2D DIGITAL DIAGNOSTIC UNILATERAL RIGHT MAMMOGRAM WITH CAD AND ADJUNCT TOMO COMPARISON:  Previous exam(s). ACR Breast Density Category b: There are scattered areas of fibroglandular density. FINDINGS: The previously noted oval, circumscribed, 2-3 mm mass within the upper, outer right breast is unchanged from the December 2016 mammogram. No suspicious mass, calcifications, or other abnormality is identified within the right breast. Mammographic  images were processed with CAD. IMPRESSION: Probably benign right breast mass. RECOMMENDATION: Bilateral diagnostic mammogram and possible ultrasound in 6 months. I have discussed the findings and recommendations with the patient. Results were also provided in writing at the conclusion of the visit. If applicable, a reminder letter will be sent to the patient regarding the next appointment. BI-RADS CATEGORY  3: Probably benign. Electronically Signed   By: Pamelia Hoit M.D.   On: 10/28/2015 11:46       Assessment & Plan:   Problem List Items Addressed This Visit    Abdominal bruit    Obtain aortic ultrasound.        Relevant Orders   Ambulatory referral to Vascular Surgery   US Aorta   CAD (coronary artery disease)    Followed by cardiology.  Doing well.  Stays active.        Relevant Medications   amLODipine (NORVASC) 5 MG tablet   COPD (chronic obstructive pulmonary disease) (HCC)    Breathing stable.  Continue current treatment regimen.  Follow.       Essential hypertension - Primary    Bloodd pressure still elevated.  She took her daughter to ER this am.  Did not take her medication.  Improved on recheck today.  Still elevated.  Start on amlodipine 43m q day.  Follow pressures.  Follow metabolic panel.       Relevant Medications   amLODipine (NORVASC) 5 MG tablet   Other Relevant Orders   Basic metabolic panel (Completed)   [place]    Hypercholesterolemia    Low cholesterol diet and exercise.  Follow lipid panel and liver function tests.  On lovastatin.        Relevant Medications   amLODipine (NORVASC) 5 MG tablet   Hyperglycemia    Low carb diet and exercise.  Follow met b anda 1c.       Poly[place]mia vera ([place]    Followed by hematology.       Renal cyst    Previously evaluated by urology.  States overdue f/u.  Will schedule f/u appt.        Relevant Orders   Ambulatory referral to Urology   [place]       SEinar Pheasant MD

## 2016-04-13 NOTE — Progress Notes (Signed)
Pre visit review using our clinic review tool, if applicable. No additional management support is needed unless otherwise documented below in the visit note. 

## 2016-04-16 ENCOUNTER — Encounter: Payer: Self-pay | Admitting: Internal Medicine

## 2016-04-16 DIAGNOSIS — R0989 Other specified symptoms and signs involving the circulatory and respiratory systems: Secondary | ICD-10-CM | POA: Insufficient documentation

## 2016-04-16 NOTE — Assessment & Plan Note (Signed)
Bloodd pressure still elevated.  She took her daughter to ER this am.  Did not take her medication.  Improved on recheck today.  Still elevated.  Start on amlodipine 5mg  q day.  Follow pressures.  Follow metabolic panel.

## 2016-04-16 NOTE — Assessment & Plan Note (Signed)
Low cholesterol diet and exercise.  Follow lipid panel and liver function tests.  On lovastatin.

## 2016-04-16 NOTE — Assessment & Plan Note (Signed)
Followed by cardiology.  Doing well.  Stays active.

## 2016-04-16 NOTE — Assessment & Plan Note (Signed)
Followed by hematology 

## 2016-04-16 NOTE — Assessment & Plan Note (Signed)
Obtain aortic ultrasound.

## 2016-04-16 NOTE — Assessment & Plan Note (Signed)
Previously evaluated by urology.  States overdue f/u.  Will schedule f/u appt.

## 2016-04-16 NOTE — Assessment & Plan Note (Signed)
Breathing stable.  Continue current treatment regimen.  Follow.

## 2016-04-16 NOTE — Assessment & Plan Note (Signed)
Blood pressure still elevated.  She took her daughter to ER this am.  Did not take her medication.  Improved on recheck today.  Still elevated.  Start on amlodipine 5mg  q day.  Follow pressures.  Follow metabolic panel.

## 2016-04-16 NOTE — Assessment & Plan Note (Signed)
Low carb diet and exercise.  Follow met b and a1c.   

## 2016-04-18 ENCOUNTER — Ambulatory Visit
Admission: RE | Admit: 2016-04-18 | Discharge: 2016-04-18 | Disposition: A | Discharge status: Home / Self Care | Payer: Medicare Other | Source: Ambulatory Visit | Attending: Internal Medicine | Admitting: Internal Medicine

## 2016-04-18 ENCOUNTER — Other Ambulatory Visit: Payer: Self-pay | Admitting: Internal Medicine

## 2016-04-18 DIAGNOSIS — R928 Other abnormal and inconclusive findings on diagnostic imaging of breast: Secondary | ICD-10-CM | POA: Diagnosis not present

## 2016-04-18 DIAGNOSIS — N6311 Unspecified lump in the right breast, upper outer quadrant: Secondary | ICD-10-CM | POA: Diagnosis not present

## 2016-04-19 ENCOUNTER — Other Ambulatory Visit: Payer: Self-pay | Admitting: Internal Medicine

## 2016-04-19 ENCOUNTER — Telehealth: Payer: Self-pay | Admitting: *Deleted

## 2016-04-19 NOTE — Telephone Encounter (Signed)
Patient and pharmacy notified

## 2016-04-19 NOTE — Telephone Encounter (Signed)
[facility] will need clarity on losartan Rx  Contact [phone]

## 2016-04-19 NOTE — Telephone Encounter (Signed)
Patient informed pharmacy that she takes 100mg  and 50mg  of losartan, is this corrected? I do not see this in her chart or her office visit.

## 2016-04-19 NOTE — Telephone Encounter (Signed)
Should be on 100mg  losartan per day.

## 2016-04-21 ENCOUNTER — Other Ambulatory Visit: Payer: Self-pay | Admitting: Internal Medicine

## 2016-04-21 DIAGNOSIS — N281 Cyst of kidney, acquired: Secondary | ICD-10-CM

## 2016-04-21 DIAGNOSIS — R0989 Other specified symptoms and signs involving the circulatory and respiratory systems: Secondary | ICD-10-CM

## 2016-04-21 DIAGNOSIS — I10 Essential (primary) hypertension: Secondary | ICD-10-CM

## 2016-04-21 NOTE — Progress Notes (Signed)
Order placed for retroperitoneal ultrasound.

## 2016-04-28 ENCOUNTER — Ambulatory Visit
Admission: RE | Admit: 2016-04-28 | Discharge: 2016-04-28 | Disposition: A | Discharge status: Home / Self Care | Payer: Medicare Other | Source: Ambulatory Visit | Attending: Internal Medicine | Admitting: Internal Medicine

## 2016-04-28 DIAGNOSIS — N281 Cyst of kidney, acquired: Secondary | ICD-10-CM | POA: Insufficient documentation

## 2016-05-01 ENCOUNTER — Other Ambulatory Visit: Payer: Self-pay | Admitting: *Deleted

## 2016-05-01 ENCOUNTER — Inpatient Hospital Stay: Payer: Medicare Other

## 2016-05-01 ENCOUNTER — Encounter: Payer: Self-pay | Admitting: Oncology

## 2016-05-01 ENCOUNTER — Inpatient Hospital Stay: Payer: Medicare Other | Attending: Oncology | Admitting: Oncology

## 2016-05-01 VITALS — BP 169/69 | HR 66 | Temp 97.0°F | Resp 18 | Ht 65.0 in | Wt 134.3 lb

## 2016-05-01 DIAGNOSIS — Z808 Family history of malignant neoplasm of other organs or systems: Secondary | ICD-10-CM | POA: Diagnosis not present

## 2016-05-01 DIAGNOSIS — J439 Emphysema, unspecified: Secondary | ICD-10-CM | POA: Diagnosis not present

## 2016-05-01 DIAGNOSIS — J449 Chronic obstructive pulmonary disease, unspecified: Secondary | ICD-10-CM | POA: Diagnosis not present

## 2016-05-01 DIAGNOSIS — I10 Essential (primary) hypertension: Secondary | ICD-10-CM | POA: Insufficient documentation

## 2016-05-01 DIAGNOSIS — Z87891 Personal history of nicotine dependence: Secondary | ICD-10-CM | POA: Insufficient documentation

## 2016-05-01 DIAGNOSIS — D751 Secondary polycythemia: Secondary | ICD-10-CM | POA: Insufficient documentation

## 2016-05-01 DIAGNOSIS — I2510 Atherosclerotic heart disease of native coronary artery without angina pectoris: Secondary | ICD-10-CM | POA: Insufficient documentation

## 2016-05-01 DIAGNOSIS — J45909 Unspecified asthma, uncomplicated: Secondary | ICD-10-CM | POA: Insufficient documentation

## 2016-05-01 DIAGNOSIS — Z85828 Personal history of other malignant neoplasm of skin: Secondary | ICD-10-CM | POA: Insufficient documentation

## 2016-05-01 DIAGNOSIS — Z7982 Long term (current) use of aspirin: Secondary | ICD-10-CM | POA: Diagnosis not present

## 2016-05-01 DIAGNOSIS — D45 Polycythemia vera: Secondary | ICD-10-CM

## 2016-05-01 DIAGNOSIS — Z86010 Personal history of colonic polyps: Secondary | ICD-10-CM | POA: Insufficient documentation

## 2016-05-01 DIAGNOSIS — M818 Other osteoporosis without current pathological fracture: Secondary | ICD-10-CM | POA: Insufficient documentation

## 2016-05-01 DIAGNOSIS — Z8041 Family history of malignant neoplasm of ovary: Secondary | ICD-10-CM | POA: Diagnosis not present

## 2016-05-01 DIAGNOSIS — Z800 Family history of malignant neoplasm of digestive organs: Secondary | ICD-10-CM | POA: Diagnosis not present

## 2016-05-01 DIAGNOSIS — R918 Other nonspecific abnormal finding of lung field: Secondary | ICD-10-CM | POA: Diagnosis not present

## 2016-05-01 DIAGNOSIS — D649 Anemia, unspecified: Secondary | ICD-10-CM | POA: Diagnosis not present

## 2016-05-01 DIAGNOSIS — Z803 Family history of malignant neoplasm of breast: Secondary | ICD-10-CM

## 2016-05-01 DIAGNOSIS — K219 Gastro-esophageal reflux disease without esophagitis: Secondary | ICD-10-CM | POA: Insufficient documentation

## 2016-05-01 DIAGNOSIS — E7800 Pure hypercholesterolemia, unspecified: Secondary | ICD-10-CM | POA: Diagnosis not present

## 2016-05-01 DIAGNOSIS — R739 Hyperglycemia, unspecified: Secondary | ICD-10-CM

## 2016-05-01 DIAGNOSIS — Z1509 Genetic susceptibility to other malignant neoplasm: Secondary | ICD-10-CM | POA: Insufficient documentation

## 2016-05-01 DIAGNOSIS — Z79899 Other long term (current) drug therapy: Secondary | ICD-10-CM

## 2016-05-01 DIAGNOSIS — N281 Cyst of kidney, acquired: Secondary | ICD-10-CM

## 2016-05-01 LAB — CBC WITH DIFFERENTIAL/PLATELET
BASOS ABS: 0.1 10*3/uL (ref 0–0.1)
BASOS PCT: 1 %
EOS ABS: 0.4 10*3/uL (ref 0–0.7)
EOS PCT: 3 %
HCT: 47.6 % — ABNORMAL HIGH (ref 35.0–47.0)
Hemoglobin: 16.4 g/dL — ABNORMAL HIGH (ref 12.0–16.0)
Lymphocytes Relative: 19 %
Lymphs Abs: 2.1 10*3/uL (ref 1.0–3.6)
MCH: 30.7 pg (ref 26.0–34.0)
MCHC: 34.4 g/dL (ref 32.0–36.0)
MCV: 89.4 fL (ref 80.0–100.0)
MONO ABS: 0.9 10*3/uL (ref 0.2–0.9)
Monocytes Relative: 8 %
Neutro Abs: 7.7 10*3/uL — ABNORMAL HIGH (ref 1.4–6.5)
Neutrophils Relative %: 69 %
PLATELETS: 198 10*3/uL (ref 150–440)
RBC: 5.33 MIL/uL — AB (ref 3.80–5.20)
RDW: 14 % (ref 11.5–14.5)
WBC: 11.1 10*3/uL — AB (ref 3.6–11.0)

## 2016-05-01 NOTE — Progress Notes (Signed)
Hematology/Oncology Consult note [facility]  [phone] [phone]  Patient Care Team: Einar Pheasant, MD as PCP - General (Internal Medicine)   Name of the patient: Tina [name]  [id number]  10-23-1934   Date of visit: 05/01/16  Diagnosis- 1. Secondary polycythemia likely from underlying COPD. Jak 2 negative 2. History of BRCA positivity 3. History of lung nodules- getting low-dose screening CT  Chief complaint/ Reason for visit- patient is here for follow-up polycythemia.  Heme/Onc history: Patient was initially seen for her polycythemia in 2012 and at that time she had an EPO level checked which was normal at 12.7. jak2 testing was negative. The cause of polycythemia has been activated to her underlying COPD. she does not require continuous oxygen for the same and findings of COPD were mainly noted on chest x-ray. She has been on inhalers for the same. She has been getting intermittent phlebotomy to keep her hematocrit less than 45.  Interval history- per day she reports doing well overall. Denies any headaches, blurry vision or lightheadedness or chest pain. Her PCP Dr. Nicki Reaper has been managing her hypertension since her blood pressure has been running high recently.  ECOG PS- 1  Review of systems- Review of Systems  Constitutional: Negative for chills, fever, malaise/fatigue and weight loss.  HENT: Negative for congestion, ear discharge and nosebleeds.   Eyes: Negative for blurred vision.  Respiratory: Negative for cough, hemoptysis, sputum production, shortness of breath and wheezing.   Cardiovascular: Negative for chest pain, palpitations, orthopnea and claudication.  Gastrointestinal: Negative for abdominal pain, blood in stool, constipation, diarrhea, heartburn, melena, nausea and vomiting.  Genitourinary: Negative for dysuria, flank pain, frequency, hematuria and urgency.  Musculoskeletal: Negative for back pain, joint pain and  myalgias.  Skin: Negative for rash.  Neurological: Negative for dizziness, tingling, focal weakness, seizures, weakness and headaches.  Endo/Heme/Allergies: Does not bruise/bleed easily.  Psychiatric/Behavioral: Negative for depression and suicidal ideas. The patient does not have insomnia.      Current treatment- intermittent phlebotomy  Allergies  Allergen Reactions   Evista [Raloxifene] Other (See Comments)    Night sweats Night sweats Reaction:  Night sweats    Fluticasone-Salmeterol Other (See Comments)    Reaction:  Cough      Past Medical History:  Diagnosis Date   Anemia    Asthma    BRCA positive    CAD (coronary artery disease)    COPD (chronic obstructive pulmonary disease) (HCC)    Emphysema lung (HCC)    GERD (gastroesophageal reflux disease)    History of colon polyps    Hypercholesterolemia    Hyperglycemia    Hypertension    Lung nodules    Osteoporosis    Personal history of tobacco use, presenting hazards to health 11/25/2014   Polycythemia vera(238.4)    Renal cyst    Skin cancer      Past Surgical History:  Procedure Laterality Date   ANGIOPLASTY     coronary (x1)   COLONOSCOPY WITH PROPOFOL N/A 03/02/2015   Procedure: COLONOSCOPY WITH PROPOFOL;  Surgeon: Lollie Sails, MD;  [facility];  Service: Endoscopy;  Laterality: N/A;   CORONARY ANGIOPLASTY     CORONARY ARTERY BYPASS GRAFT     SALPINGOOPHORECTOMY     TONSILECTOMY/ADENOIDECTOMY WITH MYRINGOTOMY     TUBAL LIGATION      Social History   Social History   Marital status: Single    Spouse name: N/A   Number of children: 2  Years of education: N/A   Occupational History   Not on file.   Social History Main Topics   Smoking status: Former Smoker    Packs/day: 1.00    Years: 45.00    Types: Cigarettes    Quit date: 11/11/2003   Smokeless tobacco: Never Used   Alcohol use No     Comment: occasional   Drug use: No   Sexual  activity: Not on file   Other Topics Concern   Not on file   Social History Narrative   No narrative on file    Family History  Problem Relation Age of Onset   Cirrhosis Father     died age 24   Alcohol abuse Father    Asthma Mother    Congestive Heart Failure Mother    Breast cancer Mother     2 (1/2 sisters)   Osteoarthritis Mother    Colon cancer Mother    Lupus Sister    Alcohol abuse Sister    Ovarian cancer Sister    Osteoporosis Sister    Skin cancer Sister    Breast cancer Cousin    Breast cancer Maternal Aunt      Current Outpatient Prescriptions:    amLODipine (NORVASC) 5 MG tablet, Take 1 tablet (5 mg total) by mouth daily., Disp: 30 tablet, Rfl: 1   aspirin EC 81 MG tablet, Take 81 mg by mouth daily., Disp: , Rfl:    Calcium Carbonate-Vitamin D (CALCIUM-D) 600-400 MG-UNIT TABS, Take 1 tablet by mouth daily., Disp: , Rfl:    cholecalciferol (VITAMIN D) 1000 units tablet, Take 1,000 Units by mouth daily., Disp: , Rfl:    fluticasone (FLONASE) 50 MCG/ACT nasal spray, Place 2 sprays into the nose daily., Disp: 16 g, Rfl: 5   ipratropium-albuterol (DUONEB) 0.5-2.5 (3) MG/3ML SOLN, Take 3 mLs by nebulization every 6 (six) hours as needed (for shortness of breath)., Disp: 36 mL, Rfl: 0   losartan (COZAAR) 100 MG tablet, TAKE ONE TABLET BY MOUTH EVERY DAY, Disp: 30 tablet, Rfl: 1   lovastatin (MEVACOR) 40 MG tablet, Take 1 tablet by mouth  daily, Disp: 90 tablet, Rfl: 3   metoprolol tartrate (LOPRESSOR) 25 MG tablet, Take one-half tablet by  mouth two times daily, Disp: 90 tablet, Rfl: 1   SYMBICORT 160-4.5 MCG/ACT inhaler, Use 2 puffs two times daily, Disp: 30.6 g, Rfl: 3   triamterene-hydrochlorothiazide (MAXZIDE-25) 37.5-25 MG tablet, Take 1 tablet by mouth  daily, Disp: 90 tablet, Rfl: 1  Physical exam:  Vitals:   05/01/16 1432  BP: (!) 169/69  Pulse: 66  Resp: 18  Temp: 97 F (36.1 C)  TempSrc: Tympanic  Weight: 134 lb 4.2 oz  (60.9 kg)  Height: '5\' 5"'  (1.651 m)   Physical Exam  Constitutional: She is oriented to person, place, and time and well-developed, well-nourished, and in no distress.  HENT:  Head: Normocephalic and atraumatic.  Eyes: EOM are normal. Pupils are equal, round, and reactive to light.  Neck: Normal range of motion.  Cardiovascular: Normal rate, regular rhythm and normal heart sounds.   Pulmonary/Chest: Effort normal and breath sounds normal.  Abdominal: Soft. Bowel sounds are normal.  Neurological: She is alert and oriented to person, place, and time.  Skin: Skin is warm and dry.     CMP Latest Ref Rng & Units 04/13/2016  Glucose 70 - 99 mg/dL 109(H)  BUN 6 - 23 mg/dL 19  Creatinine 0.40 - 1.20 mg/dL 1.08  Sodium 135 - 145  mEq/L 140  Potassium 3.5 - 5.1 mEq/L 3.7  Chloride 96 - 112 mEq/L 100  CO2 19 - 32 mEq/L 31  Calcium 8.4 - 10.5 mg/dL 10.1  Total Protein 6.0 - 8.3 g/dL -  Total Bilirubin 0.2 - 1.2 mg/dL -  Alkaline Phos 39 - 117 U/L -  AST 0 - 37 U/L -  ALT 0 - 35 U/L -   CBC Latest Ref Rng & Units 05/01/2016  WBC 3.6 - 11.0 K/uL 11.1(H)  Hemoglobin 12.0 - 16.0 g/dL 16.4(H)  Hematocrit 35.0 - 47.0 % 47.6(H)  Platelets 150 - 440 K/uL 198    No images are attached to the encounter.  US Breast Ltd Uni Right Inc Axilla  Result Date: 04/18/2016 CLINICAL DATA:  Term follow-up for a probably benign small mass in the right breast. This was initially evaluated in November 2016. EXAM: 2D DIGITAL DIAGNOSTIC BILATERAL MAMMOGRAM WITH CAD AND ADJUNCT TOMO ULTRASOUND RIGHT BREAST COMPARISON:  Previous exam(s). ACR Breast Density Category b: There are scattered areas of fibroglandular density. FINDINGS: The small round circumscribed mass in the upper outer right breast is without change. There are no new masses. There are no areas of architectural distortion and there are no suspicious calcifications. Mammographic images were processed with CAD. Targeted ultrasound is performed, showing a  small round hypo to anechoic circumscribed mass in the 10 o'clock position of the right breast, 4 cm the nipple, measuring 2.4 mm, unchanged from prior exam. IMPRESSION: 1. Probably benign small mass in the right breast, most likely a cyst, stable for 1 year. 2. No new abnormalities. RECOMMENDATION: Diagnostic mammography with right breast ultrasound in 1 year to document 2 years of stability for the small probably benign mass in the right breast. I have discussed the findings and recommendations with the patient. Results were also provided in writing at the conclusion of the visit. If applicable, a reminder letter will be sent to the patient regarding the next appointment. BI-RADS CATEGORY  3: Probably benign. Electronically Signed   By: Lajean Manes M.D.   On: 04/18/2016 11:28   Mm Diag Breast Tomo Bilateral  Result Date: 04/18/2016 CLINICAL DATA:  Term follow-up for a probably benign small mass in the right breast. This was initially evaluated in November 2016. EXAM: 2D DIGITAL DIAGNOSTIC BILATERAL MAMMOGRAM WITH CAD AND ADJUNCT TOMO ULTRASOUND RIGHT BREAST COMPARISON:  Previous exam(s). ACR Breast Density Category b: There are scattered areas of fibroglandular density. FINDINGS: The small round circumscribed mass in the upper outer right breast is without change. There are no new masses. There are no areas of architectural distortion and there are no suspicious calcifications. Mammographic images were processed with CAD. Targeted ultrasound is performed, showing a small round hypo to anechoic circumscribed mass in the 10 o'clock position of the right breast, 4 cm the nipple, measuring 2.4 mm, unchanged from prior exam. IMPRESSION: 1. Probably benign small mass in the right breast, most likely a cyst, stable for 1 year. 2. No new abnormalities. RECOMMENDATION: Diagnostic mammography with right breast ultrasound in 1 year to document 2 years of stability for the small probably benign mass in the right breast.  I have discussed the findings and recommendations with the patient. Results were also provided in writing at the conclusion of the visit. If applicable, a reminder letter will be sent to the patient regarding the next appointment. BI-RADS CATEGORY  3: Probably benign. Electronically Signed   By: Lajean Manes M.D.   On: 04/18/2016 11:28  [name] Retroperitoneal Comp  Result Date: 04/28/2016 CLINICAL DATA:  Hypertension, abdominal bruit, known left renal cyst. EXAM: ULTRASOUND RETROPERITONEAL COMPLETE TECHNIQUE: Ultrasound examination of the abdominal aorta was performed to evaluate for abdominal aortic aneurysm. The common iliac arteries, IVC, and kidneys were also evaluated. COMPARISON:  Renal ultrasound of March 10, 2015 FINDINGS: Abdominal Aorta No aneurysm identified. Maximum AP Diameter:  2.1 cm Maximum TRV Diameter: 1.9 cm Right Common Iliac Artery No aneurysm identified. Maximal diameter is 1 cm in the transverse plane. Left Common Iliac Artery No aneurysm identified.  Maximal diameter is 1 cm in the AP plane. IVC No abnormality visualized. Right Kidney Length: 9.7 cm Echogenicity within normal limits. No mass or hydronephrosis visualized. Left Kidney Length: 10.7 cm echogenicity within normal limits. There is a stable appearing simple cyst in the lower pole measuring 7 x 5.1 x 6.5 cm. The there is no solid mass or hydronephrosis. IMPRESSION: Stable 7 cm simple appearing mid to lower pole cyst in the left kidney. Normal-appearing right kidney. No abdominal aortic or common iliac artery aneurysm. Electronically Signed   By: David  Martinique M.D.   On: 04/28/2016 10:57     Assessment and plan- Patient is a 81 y.o. female with a history of secondary polycythemia likely from her underlying COPD  1. With regards to her polycythemia I have reviewed her H&H over the last 5 years and it has always been less than 50. Also she had jak2 and EPO level checked in the past which was negative and hence she does not  have polycythemia vera. This is secondary polycythemia from her underlying COPD when she does not need phlebotomy unless her hematocrit is more than 60. She also does not have any underlying signs and symptoms of obstructive sleep apnea and she does not smoke currently which could be contributing to her polycythemia. We will get a repeat CBC in 3 months time and I'll see her back in 6 months time for M.D. visit with labs but hold off on phlebotomy at this time  2. History of BRCA positivity- she has been getting mammogram and ultrasound with her primary care doctor and her recent mammogram from December 2017 recommended yearly follow-up  3. Patient also has lung nodules and has been on a low-dose screening chest CT protocol which was done until last year. She did not get Medicare approval for this year. If we can get it approved she could potentially get CT scans until the age of 68  4. Hypertension- her medications are currently being adjusted by her primary care doctor and hence I will not make any changes to her medications at this time.   Visit Diagnosis 1. Polycythemia, secondary      Dr. Randa Evens, MD, MPH [place] [facility]  05/01/2016 3:01 PM

## 2016-05-02 ENCOUNTER — Ambulatory Visit: Payer: Medicare Other | Admitting: Urology

## 2016-05-10 ENCOUNTER — Other Ambulatory Visit: Payer: Self-pay | Admitting: Internal Medicine

## 2016-05-10 DIAGNOSIS — E7800 Pure hypercholesterolemia, unspecified: Secondary | ICD-10-CM | POA: Diagnosis not present

## 2016-05-10 DIAGNOSIS — Z955 Presence of coronary angioplasty implant and graft: Secondary | ICD-10-CM | POA: Diagnosis not present

## 2016-05-10 DIAGNOSIS — J449 Chronic obstructive pulmonary disease, unspecified: Secondary | ICD-10-CM | POA: Diagnosis not present

## 2016-05-10 DIAGNOSIS — R0602 Shortness of breath: Secondary | ICD-10-CM | POA: Diagnosis not present

## 2016-05-10 DIAGNOSIS — R002 Palpitations: Secondary | ICD-10-CM | POA: Diagnosis not present

## 2016-05-10 DIAGNOSIS — I10 Essential (primary) hypertension: Secondary | ICD-10-CM | POA: Diagnosis not present

## 2016-05-23 ENCOUNTER — Encounter: Payer: Self-pay | Admitting: Internal Medicine

## 2016-05-23 ENCOUNTER — Ambulatory Visit (INDEPENDENT_AMBULATORY_CARE_PROVIDER_SITE_OTHER): Payer: Medicare Other | Admitting: Internal Medicine

## 2016-05-23 DIAGNOSIS — D751 Secondary polycythemia: Secondary | ICD-10-CM

## 2016-05-23 DIAGNOSIS — R739 Hyperglycemia, unspecified: Secondary | ICD-10-CM

## 2016-05-23 DIAGNOSIS — J441 Chronic obstructive pulmonary disease with (acute) exacerbation: Secondary | ICD-10-CM

## 2016-05-23 DIAGNOSIS — I10 Essential (primary) hypertension: Secondary | ICD-10-CM | POA: Diagnosis not present

## 2016-05-23 DIAGNOSIS — N281 Cyst of kidney, acquired: Secondary | ICD-10-CM | POA: Diagnosis not present

## 2016-05-23 DIAGNOSIS — E7800 Pure hypercholesterolemia, unspecified: Secondary | ICD-10-CM

## 2016-05-23 DIAGNOSIS — R928 Other abnormal and inconclusive findings on diagnostic imaging of breast: Secondary | ICD-10-CM

## 2016-05-23 DIAGNOSIS — I2510 Atherosclerotic heart disease of native coronary artery without angina pectoris: Secondary | ICD-10-CM

## 2016-05-23 MED ORDER — PREDNISONE 10 MG PO TABS: ORAL_TABLET | ORAL | 0 refills | Status: DC

## 2016-05-23 NOTE — Progress Notes (Signed)
Pre visit review using our clinic review tool, if applicable. No additional management support is needed unless otherwise documented below in the visit note. 

## 2016-05-23 NOTE — Progress Notes (Signed)
Patient ID: PAYTON MODER, female   DOB: Jul 09, 1934, 82 y.o.   MRN: [id number]   Subjective:    Patient ID: [name], female    DOB: December 20, 1934, 82 y.o.   MRN: [id number]  HPI  Patient here for a scheduled follow up.  She has noticed recently some ear pain.  Started several days ago.  Took aspirin.  Resolved.  Following day pain returned.  Took aspirin again.  Resolved.  Returned again, not as bad.  Took aspirin yesterday.  No pain today.  No headache.  Some congestion.  Some increased cough and hoarseness.  Some wheezing.  No nausea or vomiting.  Bowels stable.  Followed by Dr Saralyn Pilar.     Past Medical History:  Diagnosis Date  . Anemia   . Asthma   . BRCA positive   . CAD (coronary artery disease)   . COPD (chronic obstructive pulmonary disease) ([place])   . Emphysema lung ([place])   . GERD (gastroesophageal reflux disease)   . History of colon polyps   . Hypercholesterolemia   . Hyperglycemia   . Hypertension   . Lung nodules   . Osteoporosis   . Personal history of tobacco use, presenting hazards to health 11/25/2014  . Polycythemia vera(238.4)   . Renal cyst   . Skin cancer    Past Surgical History:  Procedure Laterality Date  . ANGIOPLASTY     coronary (x1)  . COLONOSCOPY WITH PROPOFOL N/A 03/02/2015   Procedure: COLONOSCOPY WITH PROPOFOL;  Surgeon: Lollie Sails, MD;  Location: [facility] ENDOSCOPY;  Service: Endoscopy;  Laterality: N/A;  . CORONARY ANGIOPLASTY    . CORONARY ARTERY BYPASS GRAFT    . SALPINGOOPHORECTOMY    . TONSILECTOMY/ADENOIDECTOMY WITH MYRINGOTOMY    . TUBAL LIGATION     Family History  Problem Relation Age of Onset  . Cirrhosis Father     died age 43  . Alcohol abuse Father   . Asthma Mother   . Congestive Heart Failure Mother   . Breast cancer Mother     2 (1/2 sisters)  . Osteoarthritis Mother   . Colon cancer Mother   . Lupus Sister   . Alcohol abuse Sister   . Ovarian cancer Sister   . Osteoporosis Sister   . Skin cancer Sister   .  Breast cancer Cousin   . Breast cancer Maternal Aunt    Social History   Social History  . Marital status: Single    Spouse name: N/A  . Number of children: 2  . Years of education: N/A   Social History Main Topics  . Smoking status: Former Smoker    Packs/day: 1.00    Years: 45.00    Types: Cigarettes    Quit date: 11/11/2003  . Smokeless tobacco: Never Used  . Alcohol use No     Comment: occasional  . Drug use: No  . Sexual activity: Not Asked   Other Topics Concern  . None   Social History Narrative  . None    Outpatient Encounter Prescriptions as of 05/23/2016  Medication Sig  . amLODipine (NORVASC) 5 MG tablet TAKE ONE TABLET BY MOUTH EVERY DAY  . aspirin EC 81 MG tablet Take 81 mg by mouth daily.  . Calcium Carbonate-Vitamin D (CALCIUM-D) 600-400 MG-UNIT TABS Take 1 tablet by mouth daily.  . cholecalciferol (VITAMIN D) 1000 units tablet Take 1,000 Units by mouth daily.  . fluticasone (FLONASE) 50 MCG/ACT nasal spray Place 2 sprays into the nose  daily.  . ipratropium-albuterol (DUONEB) 0.5-2.5 (3) MG/3ML SOLN Take 3 mLs by nebulization every 6 (six) hours as needed (for shortness of breath).  . losartan (COZAAR) 100 MG tablet TAKE ONE TABLET BY MOUTH EVERY DAY  . lovastatin (MEVACOR) 40 MG tablet Take 1 tablet by mouth  daily  . metoprolol tartrate (LOPRESSOR) 25 MG tablet Take one-half tablet by  mouth two times daily  . SYMBICORT 160-4.5 MCG/ACT inhaler Use 2 puffs two times daily  . triamterene-hydrochlorothiazide (MAXZIDE-25) 37.5-25 MG tablet Take 1 tablet by mouth  daily  . predniSONE (DELTASONE) 10 MG tablet Take 6 tablets x 1 day and then decrease by 1/2 tablet per day until down to zero mg.   No facility-administered encounter medications on file as of 05/23/2016.     Review of Systems  Constitutional: Negative for appetite change and unexpected weight change.  HENT: Positive for congestion and ear pain. Negative for sinus pressure.   Respiratory: Positive  for cough. Negative for chest tightness.   Cardiovascular: Negative for chest pain, palpitations and leg swelling.  Gastrointestinal: Negative for abdominal pain, diarrhea, nausea and vomiting.  Genitourinary: Negative for difficulty urinating and dysuria.  Musculoskeletal: Negative for joint swelling and myalgias.  Skin: Negative for color change and rash.  Neurological: Negative for dizziness, light-headedness and headaches.  Psychiatric/Behavioral: Negative for agitation and dysphoric mood.       Objective:    Physical Exam  Constitutional: She appears well-developed and well-nourished. No distress.  HENT:  Nose: Nose normal.  Mouth/Throat: Oropharynx is clear and moist.  Neck: Neck supple. No thyromegaly present.  Cardiovascular: Normal rate and regular rhythm.   Pulmonary/Chest: Breath sounds normal. No respiratory distress. She has no wheezes.  Increased cough with forced expiraion.   Abdominal: Soft. Bowel sounds are normal. There is no tenderness.  Musculoskeletal: She exhibits no edema or tenderness.  Lymphadenopathy:    She has no cervical adenopathy.  Skin: No rash noted. No erythema.  Psychiatric: She has a normal mood and affect. Her behavior is normal.    BP (!) 146/58 (BP Location: Left Arm, Patient Position: Sitting, Cuff Size: Normal)   Pulse 68   Temp 97.8 F (36.6 C) (Oral)   Resp 18   Wt 138 lb 6 oz (62.8 kg)   SpO2 96%   BMI 23.03 kg/m  Wt Readings from Last 3 Encounters:  05/23/16 138 lb 6 oz (62.8 kg)  05/01/16 134 lb 4.2 oz (60.9 kg)  04/13/16 135 lb 3.2 oz (61.3 kg)     Lab Results  Component Value Date   WBC 11.1 (H) 05/01/2016   HGB 16.4 (H) 05/01/2016   HCT 47.6 (H) 05/01/2016   PLT 198 05/01/2016   GLUCOSE 109 (H) 04/13/2016   CHOL 158 02/16/2016   TRIG 86.0 02/16/2016   HDL 60.10 02/16/2016   LDLCALC 81 02/16/2016   ALT 13 02/16/2016   AST 17 02/16/2016   NA 140 04/13/2016   K 3.7 04/13/2016   CL 100 04/13/2016   CREATININE  1.08 04/13/2016   BUN 19 04/13/2016   CO2 31 04/13/2016   TSH 2.620 05/28/2015   HGBA1C 5.7 02/16/2016    [name] Retroperitoneal Comp  Result Date: 04/28/2016 CLINICAL DATA:  Hypertension, abdominal bruit, known left renal cyst. EXAM: ULTRASOUND RETROPERITONEAL COMPLETE TECHNIQUE: Ultrasound examination of the abdominal aorta was performed to evaluate for abdominal aortic aneurysm. The common iliac arteries, IVC, and kidneys were also evaluated. COMPARISON:  Renal ultrasound of March 10, 2015 FINDINGS: Abdominal  Aorta No aneurysm identified. Maximum AP Diameter:  2.1 cm Maximum TRV Diameter: 1.9 cm Right Common Iliac Artery No aneurysm identified. Maximal diameter is 1 cm in the transverse plane. Left Common Iliac Artery No aneurysm identified.  Maximal diameter is 1 cm in the AP plane. IVC No abnormality visualized. Right Kidney Length: 9.7 cm Echogenicity within normal limits. No mass or hydronephrosis visualized. Left Kidney Length: 10.7 cm echogenicity within normal limits. There is a stable appearing simple cyst in the lower pole measuring 7 x 5.1 x 6.5 cm. The there is no solid mass or hydronephrosis. IMPRESSION: Stable 7 cm simple appearing mid to lower pole cyst in the left kidney. Normal-appearing right kidney. No abdominal aortic or common iliac artery aneurysm. Electronically Signed   By: David  Martinique M.D.   On: 04/28/2016 10:57       Assessment & Plan:   Problem List Items Addressed This Visit    Abnormal mammogram    Had mammogram with ultrasound 04/18/16 - Birads III.  Recommended f/u bilateral diagnostic mammogram with ultrasound in one year.        CAD (coronary artery disease)    Followed by cardiology.  Stable.        COPD exacerbation ([place])    With increased cough and congestion.  Hoarseness.  Treat with prednisone taper as directed.  Do not feel abx warranted.  Continue inhalers.  Follow.        Relevant Medications   predniSONE (DELTASONE) 10 MG tablet    Hypercholesterolemia    On lovastatin.  Low cholesterol diet and exercise.  Follow lipid panel and liver function tests.        Hyperglycemia    Low carb diet and exercise.  Follow met b and a1c.        Hypertension    Blood pressure better.  Same medication regimen.  Follow pressures.  Follow metabolic panel.        Polycythemia, secondary    Followed by hematology.       Renal cyst    Stable cyst on recent ultrasound.  Follow.           Einar Pheasant, MD

## 2016-05-23 NOTE — Patient Instructions (Signed)
Take a probiotic daily.  (examples - align, culturelle, florastor)

## 2016-05-28 ENCOUNTER — Encounter: Payer: Self-pay | Admitting: Internal Medicine

## 2016-05-28 NOTE — Assessment & Plan Note (Signed)
With increased cough and congestion.  Hoarseness.  Treat with prednisone taper as directed.  Do not feel abx warranted.  Continue inhalers.  Follow.

## 2016-05-28 NOTE — Assessment & Plan Note (Signed)
Followed by hematology 

## 2016-05-28 NOTE — Assessment & Plan Note (Signed)
Had mammogram with ultrasound 04/18/16 - Birads III.  Recommended f/u bilateral diagnostic mammogram with ultrasound in one year.

## 2016-05-28 NOTE — Assessment & Plan Note (Signed)
Blood pressure better.  Same medication regimen.  Follow pressures.  Follow metabolic panel.

## 2016-05-28 NOTE — Assessment & Plan Note (Signed)
Low carb diet and exercise.  Follow met b and a1c.   

## 2016-05-28 NOTE — Assessment & Plan Note (Signed)
On lovastatin.  Low cholesterol diet and exercise.  Follow lipid panel and liver function tests.   

## 2016-05-28 NOTE — Assessment & Plan Note (Signed)
Stable cyst on recent ultrasound.  Follow.

## 2016-05-28 NOTE — Assessment & Plan Note (Signed)
Followed by cardiology. Stable.   

## 2016-06-05 ENCOUNTER — Telehealth: Payer: Self-pay | Admitting: Internal Medicine

## 2016-06-05 NOTE — Telephone Encounter (Signed)
Pt called and stated that she was in the hospital with her sister and saw that her legs and ankles were swollen and fluid started leaking out. She doubled up on her water pill. Is that ok. Please advise, thank you!  Call pt @ [phone]

## 2016-06-05 NOTE — Telephone Encounter (Signed)
Patient doubled up on Maxzide- 25 taking half tablet in the morning and half tablet at night on Saturday and on Sunday and half tablet this morning. due to bilateral edema and legs weeping, patient was sitting for long period of time with sister at hospital. While on phone I had patient take BP which was recorded at 166/71 pulse 75. Patient stated that legs have gone down, should she continue to half tablet BID, patient stated recently changed BP from 1 tablet daily to half tablet daily. Patient also taking 5 mg of amlodipine.

## 2016-06-05 NOTE — Telephone Encounter (Signed)
If legs are better, I would have her return back to taking 1/2 table q day of triam/hctz.  Monitor blood pressure as previously advised.  Let us know if persistent elevation.  Keep legs elevated when sitting.  If any acute problems, will need to be seen.

## 2016-06-05 NOTE — Telephone Encounter (Signed)
Patient notified and voiced understanding.

## 2016-06-06 ENCOUNTER — Other Ambulatory Visit: Payer: Self-pay | Admitting: Internal Medicine

## 2016-06-12 ENCOUNTER — Encounter: Payer: Self-pay | Admitting: Emergency Medicine

## 2016-06-12 ENCOUNTER — Emergency Department
Admission: EM | Admit: 2016-06-12 | Discharge: 2016-06-12 | Disposition: A | Discharge status: Home / Self Care | Payer: Medicare Other | Source: Home / Self Care | Attending: Emergency Medicine | Admitting: Emergency Medicine

## 2016-06-12 ENCOUNTER — Emergency Department: Payer: Medicare Other

## 2016-06-12 ENCOUNTER — Other Ambulatory Visit: Payer: Self-pay | Admitting: Internal Medicine

## 2016-06-12 DIAGNOSIS — Z7982 Long term (current) use of aspirin: Secondary | ICD-10-CM | POA: Insufficient documentation

## 2016-06-12 DIAGNOSIS — J209 Acute bronchitis, unspecified: Secondary | ICD-10-CM | POA: Diagnosis not present

## 2016-06-12 DIAGNOSIS — R05 Cough: Secondary | ICD-10-CM | POA: Diagnosis not present

## 2016-06-12 DIAGNOSIS — J45909 Unspecified asthma, uncomplicated: Secondary | ICD-10-CM

## 2016-06-12 DIAGNOSIS — Z85828 Personal history of other malignant neoplasm of skin: Secondary | ICD-10-CM | POA: Insufficient documentation

## 2016-06-12 DIAGNOSIS — I4891 Unspecified atrial fibrillation: Secondary | ICD-10-CM | POA: Diagnosis not present

## 2016-06-12 DIAGNOSIS — Z951 Presence of aortocoronary bypass graft: Secondary | ICD-10-CM | POA: Insufficient documentation

## 2016-06-12 DIAGNOSIS — R0602 Shortness of breath: Secondary | ICD-10-CM | POA: Diagnosis not present

## 2016-06-12 DIAGNOSIS — R7989 Other specified abnormal findings of blood chemistry: Secondary | ICD-10-CM | POA: Diagnosis not present

## 2016-06-12 DIAGNOSIS — D45 Polycythemia vera: Secondary | ICD-10-CM | POA: Diagnosis not present

## 2016-06-12 DIAGNOSIS — J441 Chronic obstructive pulmonary disease with (acute) exacerbation: Secondary | ICD-10-CM | POA: Insufficient documentation

## 2016-06-12 DIAGNOSIS — Z79899 Other long term (current) drug therapy: Secondary | ICD-10-CM

## 2016-06-12 DIAGNOSIS — I248 Other forms of acute ischemic heart disease: Secondary | ICD-10-CM | POA: Diagnosis not present

## 2016-06-12 DIAGNOSIS — J440 Chronic obstructive pulmonary disease with acute lower respiratory infection: Secondary | ICD-10-CM | POA: Diagnosis not present

## 2016-06-12 DIAGNOSIS — I25810 Atherosclerosis of coronary artery bypass graft(s) without angina pectoris: Secondary | ICD-10-CM

## 2016-06-12 DIAGNOSIS — Z87891 Personal history of nicotine dependence: Secondary | ICD-10-CM

## 2016-06-12 LAB — CBC WITH DIFFERENTIAL/PLATELET
BASOS ABS: 0.1 10*3/uL (ref 0–0.1)
BASOS PCT: 1 %
Eosinophils Absolute: 0 10*3/uL (ref 0–0.7)
Eosinophils Relative: 0 %
HEMATOCRIT: 45.4 % (ref 35.0–47.0)
HEMOGLOBIN: 15.8 g/dL (ref 12.0–16.0)
Lymphocytes Relative: 8 %
Lymphs Abs: 0.9 10*3/uL — ABNORMAL LOW (ref 1.0–3.6)
MCH: 31.1 pg (ref 26.0–34.0)
MCHC: 34.7 g/dL (ref 32.0–36.0)
MCV: 89.6 fL (ref 80.0–100.0)
MONOS PCT: 8 %
Monocytes Absolute: 1 10*3/uL — ABNORMAL HIGH (ref 0.2–0.9)
NEUTROS ABS: 9.7 10*3/uL — AB (ref 1.4–6.5)
NEUTROS PCT: 83 %
Platelets: 195 10*3/uL (ref 150–440)
RBC: 5.06 MIL/uL (ref 3.80–5.20)
RDW: 14.2 % (ref 11.5–14.5)
WBC: 11.7 10*3/uL — ABNORMAL HIGH (ref 3.6–11.0)

## 2016-06-12 LAB — COMPREHENSIVE METABOLIC PANEL
ALK PHOS: 53 U/L (ref 38–126)
ALT: 25 U/L (ref 14–54)
AST: 29 U/L (ref 15–41)
Albumin: 3.9 g/dL (ref 3.5–5.0)
Anion gap: 8 (ref 5–15)
BUN: 21 mg/dL — ABNORMAL HIGH (ref 6–20)
CALCIUM: 9.8 mg/dL (ref 8.9–10.3)
CO2: 28 mmol/L (ref 22–32)
Chloride: 101 mmol/L (ref 101–111)
Creatinine, Ser: 1.05 mg/dL — ABNORMAL HIGH (ref 0.44–1.00)
GFR calc Af Amer: 57 mL/min — ABNORMAL LOW (ref >60)
GFR calc non Af Amer: 49 mL/min — ABNORMAL LOW (ref >60)
GLUCOSE: 150 mg/dL — AB (ref 65–99)
Potassium: 3.1 mmol/L — ABNORMAL LOW (ref 3.5–5.1)
SODIUM: 137 mmol/L (ref 135–145)
Total Bilirubin: 0.9 mg/dL (ref 0.3–1.2)
Total Protein: 7.1 g/dL (ref 6.5–8.1)

## 2016-06-12 LAB — TROPONIN I: Troponin I: <0.03 ng/mL (ref <0.03)

## 2016-06-12 MED ORDER — PREDNISONE 50 MG PO TABS: 50.0000 mg | ORAL_TABLET | Freq: Every day | ORAL | 0 refills | Status: DC

## 2016-06-12 MED ORDER — METHYLPREDNISOLONE SODIUM SUCC 125 MG IJ SOLR
125.0000 mg | Freq: Once | INTRAMUSCULAR | Status: AC
  Administered 2016-06-12: 125 mg via INTRAVENOUS
  Filled 2016-06-12: qty 2

## 2016-06-12 MED ORDER — IPRATROPIUM-ALBUTEROL 0.5-2.5 (3) MG/3ML IN SOLN
3.0000 mL | Freq: Once | RESPIRATORY_TRACT | Status: AC
  Administered 2016-06-12: 3 mL via RESPIRATORY_TRACT
  Filled 2016-06-12: qty 3

## 2016-06-12 NOTE — ED Triage Notes (Signed)
Pt with shortness of breath the past few days, reports this morning was the worst. Pt reports taking COPD inhaler, 3 breathing tx and 2 puffs of rescue inhaler.

## 2016-06-12 NOTE — ED Provider Notes (Signed)
[facility] Emergency Department Provider Note   ____________________________________________    I have reviewed the triage vital signs and the nursing notes.   HISTORY  Chief Complaint Shortness of Breath     HPI Tina [name] is a 82 y.o. female who presents with complaints of shortness of breath. She also notes dry mouth or cough. She suspects this is related to COPD. Patient is a history of COPD and developed upper respiratory congestion last week, her provider put her on Z-Pak but she did not have any improvement. She denies fevers or chills. No recent travel. No calf pain or swelling. She has used nebulizer at home with no improvement   Past Medical History:  Diagnosis Date  . Anemia   . Asthma   . BRCA positive   . CAD (coronary artery disease)   . COPD (chronic obstructive pulmonary disease) ([place])   . Emphysema lung ([place])   . GERD (gastroesophageal reflux disease)   . History of colon polyps   . Hypercholesterolemia   . Hyperglycemia   . Hypertension   . Lung nodules   . Osteoporosis   . Personal history of tobacco use, presenting hazards to health 11/25/2014  . Polycythemia vera(238.4)   . Renal cyst   . Skin cancer     Patient Active Problem List   Diagnosis Date Noted  . Abdominal bruit 04/16/2016  . Abnormal mammogram 06/06/2015  . Essential hypertension 02/16/2015  . Numbness 01/24/2015  . Personal history of tobacco use, presenting hazards to health 11/25/2014  . BRCA positive   . COPD exacerbation ([place]) 11/14/2014  . Sensation of pressure in bladder area 08/15/2014  . Health care maintenance 08/15/2014  . Awareness of heartbeats 09/30/2013  . Presence of coronary angioplasty implant and graft 09/30/2013  . Acute confusional state 08/10/2013  . History of colonic polyps 08/25/2012  . Female genuine stress incontinence 06/28/2012  . Infection of urinary tract 06/28/2012  . CAD (coronary artery disease) 05/23/2012  .  COPD (chronic obstructive pulmonary disease) ([place]) 05/23/2012  . Hypertension 05/23/2012  . Hypercholesterolemia 05/23/2012  . Hyperglycemia 05/23/2012  . Polycythemia, secondary 05/23/2012  . Renal cyst 05/23/2012  . Osteoporosis 05/23/2012    Past Surgical History:  Procedure Laterality Date  . ANGIOPLASTY     coronary (x1)  . COLONOSCOPY WITH PROPOFOL N/A 03/02/2015   Procedure: COLONOSCOPY WITH PROPOFOL;  Surgeon: Lollie Sails, MD;  Location: [facility] ENDOSCOPY;  Service: Endoscopy;  Laterality: N/A;  . CORONARY ANGIOPLASTY    . CORONARY ARTERY BYPASS GRAFT    . SALPINGOOPHORECTOMY    . TONSILECTOMY/ADENOIDECTOMY WITH MYRINGOTOMY    . TUBAL LIGATION      Prior to Admission medications   Medication Sig Start Date End Date Taking? Authorizing Provider  amLODipine (NORVASC) 5 MG tablet TAKE ONE TABLET BY MOUTH EVERY DAY 05/10/16  Yes Einar Pheasant, MD  aspirin EC 81 MG tablet Take 81 mg by mouth daily.   Yes Historical Provider, MD  Calcium Carbonate-Vitamin D (CALCIUM-D) 600-400 MG-UNIT TABS Take 1 tablet by mouth daily.   Yes Historical Provider, MD  cholecalciferol (VITAMIN D) 1000 units tablet Take 1,000 Units by mouth daily.   Yes Historical Provider, MD  fluticasone (FLONASE) 50 MCG/ACT nasal spray Place 2 sprays into the nose daily. 10/10/12  Yes Einar Pheasant, MD  ipratropium-albuterol (DUONEB) 0.5-2.5 (3) MG/3ML SOLN Take 3 mLs by nebulization every 6 (six) hours as needed (for shortness of breath). 11/14/14  Yes Brunilda Payor A  Edd Fabian, MD  losartan (COZAAR) 100 MG tablet TAKE ONE TABLET BY MOUTH EVERY DAY 04/19/16  Yes Einar Pheasant, MD  lovastatin (MEVACOR) 40 MG tablet Take 1 tablet by mouth  daily 12/27/15  Yes Einar Pheasant, MD  metoprolol tartrate (LOPRESSOR) 25 MG tablet Take one-half tablet by  mouth two times daily 01/31/16  Yes Einar Pheasant, MD  SYMBICORT 160-4.5 MCG/ACT inhaler Use 2 puffs two times daily 03/08/15  Yes Einar Pheasant, MD  triamterene-hydrochlorothiazide  (KLKJZPH-15) 37.5-25 MG tablet TAKE 1 TABLET BY MOUTH  DAILY Patient taking differently: TAKE 1/2 TABLET BY MOUTH ONCE DAILY 06/06/16  Yes Einar Pheasant, MD     Allergies Evista [raloxifene] and Fluticasone-salmeterol  Family History  Problem Relation Age of Onset  . Cirrhosis Father     died age 74  . Alcohol abuse Father   . Asthma Mother   . Congestive Heart Failure Mother   . Breast cancer Mother     2 (1/2 sisters)  . Osteoarthritis Mother   . Colon cancer Mother   . Lupus Sister   . Alcohol abuse Sister   . Ovarian cancer Sister   . Osteoporosis Sister   . Skin cancer Sister   . Breast cancer Cousin   . Breast cancer Maternal Aunt     Social History Social History  Substance Use Topics  . Smoking status: Former Smoker    Packs/day: 1.00    Years: 45.00    Types: Cigarettes    Quit date: 11/11/2003  . Smokeless tobacco: Never Used  . Alcohol use No     Comment: occasional    Review of Systems  Constitutional: No fever/chills Eyes: No visual changes.  ENT: No sore throat. Cardiovascular: Denies chest pain. Respiratory: As above, positive cough Gastrointestinal: No abdominal pain.  No nausea, no vomiting.    Musculoskeletal: Negative for back pain. Skin: Negative for rash. Neurological: Negative for headaches  10-point ROS otherwise negative.  ____________________________________________   PHYSICAL EXAM:  VITAL SIGNS: ED Triage Vitals  Enc Vitals Group     BP 06/12/16 0938 (!) 162/68     Pulse Rate 06/12/16 0938 79     Resp 06/12/16 0938 20     Temp 06/12/16 0938 98.1 F (36.7 C)     Temp Source 06/12/16 0938 Oral     SpO2 06/12/16 0938 97 %     Weight 06/12/16 0946 140 lb (63.5 kg)     Height 06/12/16 0946 '5\' 5"'  (1.651 m)     Head Circumference --      Peak Flow --      Pain Score --      Pain Loc --      Pain Edu? --      Excl. in [place]? --     Constitutional: Alert and oriented. No acute distress. Pleasant and interactive Eyes:  Conjunctivae are normal.   Nose: No congestion/rhinnorhea. Mouth/Throat: Mucous membranes are moist.   Neck:  Painless ROM Cardiovascular: Normal rate, regular rhythm. Grossly normal heart sounds.  Good peripheral circulation. Respiratory: Mildly increased respiratory effort, diffuse wheezing. Gastrointestinal: Soft and nontender. No distention.  No CVA tenderness. Genitourinary: deferred Musculoskeletal: No lower extremity tenderness nor edema.  Warm and well perfused Neurologic:  Normal speech and language. No gross focal neurologic deficits are appreciated.  Skin:  Skin is warm, dry and intact. No rash noted. Psychiatric: Mood and affect are normal. Speech and behavior are normal.  ____________________________________________   LABS (all labs ordered are listed, but  only abnormal results are displayed)  Labs Reviewed  CBC WITH DIFFERENTIAL/PLATELET - Abnormal; Notable for the following:       Result Value   WBC 11.7 (*)    Neutro Abs 9.7 (*)    Lymphs Abs 0.9 (*)    Monocytes Absolute 1.0 (*)    All other components within normal limits  COMPREHENSIVE METABOLIC PANEL - Abnormal; Notable for the following:    Potassium 3.1 (*)    Glucose, Bld 150 (*)    BUN 21 (*)    Creatinine, Ser 1.05 (*)    GFR calc non Af Amer 49 (*)    GFR calc Af Amer 57 (*)    All other components within normal limits  TROPONIN I  BRAIN NATRIURETIC PEPTIDE  BRAIN NATRIURETIC PEPTIDE   ____________________________________________  EKG  ED ECG REPORT I, Lavonia Drafts, the attending physician, personally viewed and interpreted this ECG.  Date: 06/12/2016 EKG Time: 9:42 AM Rate: 80 Rhythm: normal sinus rhythm QRS Axis: normal Intervals: normal ST/T Wave abnormalities: normal Conduction Disturbances: PVC Narrative Interpretation: unremarkable  ____________________________________________  RADIOLOGY  Chest x-ray  unremarkable ____________________________________________   PROCEDURES  Procedure(s) performed: No    Critical Care performed: No ____________________________________________   INITIAL IMPRESSION / ASSESSMENT AND PLAN / ED COURSE  Pertinent labs & imaging results that were available during my care of the patient were reviewed by me and considered in my medical decision making (see chart for details).  Patient presents with complaints of shortness of breath dry cough. She has wheezing on exam and this appears consistent with COPD exacerbation. We'll treat with IV steroids and DuoNeb's and reevaluate.    _ After treatment patient reports significant improvement. She feels well enough to go home and is quite insistent on doing so. She still has scattered mild wheezes and I offered admission she refused and states she has family to care for, we will treat with steroids and she will use her nebulizer every 4 hours. She knows to return if any worsening of her symptoms. No evidence of pneumonia, follow-up with PCP ___________________________________________   FINAL CLINICAL IMPRESSION(S) / ED DIAGNOSES  Final diagnoses:  COPD exacerbation ([place])      NEW MEDICATIONS STARTED DURING THIS VISIT:  New Prescriptions   No medications on file     Note:  This document was prepared using Dragon voice recognition software and may include unintentional dictation errors.    Lavonia Drafts, MD 06/12/16 [phone]

## 2016-06-12 NOTE — ED Notes (Signed)
AAOx3.  Skin warm and dry.  NAD 

## 2016-06-12 NOTE — ED Triage Notes (Signed)
Shortness of breath x 4 days. Denies fevers.

## 2016-06-14 ENCOUNTER — Emergency Department: Payer: Medicare Other

## 2016-06-14 ENCOUNTER — Inpatient Hospital Stay
Admission: EM | Admit: 2016-06-14 | Discharge: 2016-06-17 | DRG: 191 | Disposition: A | Discharge status: Home / Self Care | Payer: Medicare Other | Attending: Internal Medicine | Admitting: Internal Medicine

## 2016-06-14 ENCOUNTER — Encounter: Payer: Self-pay | Admitting: *Deleted

## 2016-06-14 DIAGNOSIS — Z7952 Long term (current) use of systemic steroids: Secondary | ICD-10-CM

## 2016-06-14 DIAGNOSIS — Z8249 Family history of ischemic heart disease and other diseases of the circulatory system: Secondary | ICD-10-CM | POA: Diagnosis not present

## 2016-06-14 DIAGNOSIS — Z803 Family history of malignant neoplasm of breast: Secondary | ICD-10-CM

## 2016-06-14 DIAGNOSIS — Z825 Family history of asthma and other chronic lower respiratory diseases: Secondary | ICD-10-CM

## 2016-06-14 DIAGNOSIS — K219 Gastro-esophageal reflux disease without esophagitis: Secondary | ICD-10-CM | POA: Diagnosis present

## 2016-06-14 DIAGNOSIS — I129 Hypertensive chronic kidney disease with stage 1 through stage 4 chronic kidney disease, or unspecified chronic kidney disease: Secondary | ICD-10-CM | POA: Diagnosis present

## 2016-06-14 DIAGNOSIS — E7800 Pure hypercholesterolemia, unspecified: Secondary | ICD-10-CM | POA: Diagnosis present

## 2016-06-14 DIAGNOSIS — I2510 Atherosclerotic heart disease of native coronary artery without angina pectoris: Secondary | ICD-10-CM | POA: Diagnosis present

## 2016-06-14 DIAGNOSIS — Z951 Presence of aortocoronary bypass graft: Secondary | ICD-10-CM

## 2016-06-14 DIAGNOSIS — R0600 Dyspnea, unspecified: Secondary | ICD-10-CM

## 2016-06-14 DIAGNOSIS — J440 Chronic obstructive pulmonary disease with acute lower respiratory infection: Principal | ICD-10-CM | POA: Diagnosis present

## 2016-06-14 DIAGNOSIS — Z7951 Long term (current) use of inhaled steroids: Secondary | ICD-10-CM

## 2016-06-14 DIAGNOSIS — R05 Cough: Secondary | ICD-10-CM | POA: Diagnosis not present

## 2016-06-14 DIAGNOSIS — I248 Other forms of acute ischemic heart disease: Secondary | ICD-10-CM | POA: Diagnosis present

## 2016-06-14 DIAGNOSIS — M810 Age-related osteoporosis without current pathological fracture: Secondary | ICD-10-CM | POA: Diagnosis present

## 2016-06-14 DIAGNOSIS — J441 Chronic obstructive pulmonary disease with (acute) exacerbation: Secondary | ICD-10-CM

## 2016-06-14 DIAGNOSIS — Z8041 Family history of malignant neoplasm of ovary: Secondary | ICD-10-CM

## 2016-06-14 DIAGNOSIS — Z85828 Personal history of other malignant neoplasm of skin: Secondary | ICD-10-CM | POA: Diagnosis not present

## 2016-06-14 DIAGNOSIS — Z79899 Other long term (current) drug therapy: Secondary | ICD-10-CM

## 2016-06-14 DIAGNOSIS — E785 Hyperlipidemia, unspecified: Secondary | ICD-10-CM | POA: Diagnosis present

## 2016-06-14 DIAGNOSIS — N183 Chronic kidney disease, stage 3 unspecified: Secondary | ICD-10-CM

## 2016-06-14 DIAGNOSIS — J209 Acute bronchitis, unspecified: Secondary | ICD-10-CM | POA: Diagnosis present

## 2016-06-14 DIAGNOSIS — Z87891 Personal history of nicotine dependence: Secondary | ICD-10-CM | POA: Diagnosis not present

## 2016-06-14 DIAGNOSIS — Z1501 Genetic susceptibility to malignant neoplasm of breast: Secondary | ICD-10-CM

## 2016-06-14 DIAGNOSIS — Z7982 Long term (current) use of aspirin: Secondary | ICD-10-CM

## 2016-06-14 DIAGNOSIS — D72829 Elevated white blood cell count, unspecified: Secondary | ICD-10-CM

## 2016-06-14 DIAGNOSIS — Z808 Family history of malignant neoplasm of other organs or systems: Secondary | ICD-10-CM | POA: Diagnosis not present

## 2016-06-14 DIAGNOSIS — Z955 Presence of coronary angioplasty implant and graft: Secondary | ICD-10-CM | POA: Diagnosis not present

## 2016-06-14 DIAGNOSIS — R079 Chest pain, unspecified: Secondary | ICD-10-CM | POA: Diagnosis not present

## 2016-06-14 DIAGNOSIS — R7989 Other specified abnormal findings of blood chemistry: Secondary | ICD-10-CM | POA: Diagnosis not present

## 2016-06-14 DIAGNOSIS — R0602 Shortness of breath: Secondary | ICD-10-CM

## 2016-06-14 DIAGNOSIS — Z86010 Personal history of colonic polyps: Secondary | ICD-10-CM | POA: Diagnosis not present

## 2016-06-14 DIAGNOSIS — D45 Polycythemia vera: Secondary | ICD-10-CM | POA: Diagnosis present

## 2016-06-14 DIAGNOSIS — Z800 Family history of malignant neoplasm of digestive organs: Secondary | ICD-10-CM | POA: Diagnosis not present

## 2016-06-14 DIAGNOSIS — I509 Heart failure, unspecified: Secondary | ICD-10-CM | POA: Diagnosis not present

## 2016-06-14 DIAGNOSIS — Z888 Allergy status to other drugs, medicaments and biological substances status: Secondary | ICD-10-CM

## 2016-06-14 DIAGNOSIS — Z8262 Family history of osteoporosis: Secondary | ICD-10-CM

## 2016-06-14 DIAGNOSIS — I10 Essential (primary) hypertension: Secondary | ICD-10-CM | POA: Diagnosis not present

## 2016-06-14 DIAGNOSIS — R778 Other specified abnormalities of plasma proteins: Secondary | ICD-10-CM

## 2016-06-14 DIAGNOSIS — I4891 Unspecified atrial fibrillation: Secondary | ICD-10-CM | POA: Diagnosis present

## 2016-06-14 LAB — COMPREHENSIVE METABOLIC PANEL
ALT: 26 U/L (ref 14–54)
AST: 33 U/L (ref 15–41)
Albumin: 3.9 g/dL (ref 3.5–5.0)
Alkaline Phosphatase: 54 U/L (ref 38–126)
Anion gap: 11 (ref 5–15)
BILIRUBIN TOTAL: 0.6 mg/dL (ref 0.3–1.2)
BUN: 45 mg/dL — AB (ref 6–20)
CO2: 24 mmol/L (ref 22–32)
CREATININE: 1.27 mg/dL — AB (ref 0.44–1.00)
Calcium: 9.8 mg/dL (ref 8.9–10.3)
Chloride: 100 mmol/L — ABNORMAL LOW (ref 101–111)
GFR, EST AFRICAN AMERICAN: 45 mL/min — AB (ref >60)
GFR, EST NON AFRICAN AMERICAN: 39 mL/min — AB (ref >60)
Glucose, Bld: 189 mg/dL — ABNORMAL HIGH (ref 65–99)
POTASSIUM: 3.7 mmol/L (ref 3.5–5.1)
Sodium: 135 mmol/L (ref 135–145)
Total Protein: 7.2 g/dL (ref 6.5–8.1)

## 2016-06-14 LAB — CBC WITH DIFFERENTIAL/PLATELET
BASOS PCT: 0 %
Basophils Absolute: 0.1 10*3/uL (ref 0–0.1)
EOS PCT: 0 %
Eosinophils Absolute: 0 10*3/uL (ref 0–0.7)
HCT: 44 % (ref 35.0–47.0)
Hemoglobin: 15.3 g/dL (ref 12.0–16.0)
LYMPHS PCT: 2 %
Lymphs Abs: 0.4 10*3/uL — ABNORMAL LOW (ref 1.0–3.6)
MCH: 31.2 pg (ref 26.0–34.0)
MCHC: 34.8 g/dL (ref 32.0–36.0)
MCV: 89.7 fL (ref 80.0–100.0)
Monocytes Absolute: 0.4 10*3/uL (ref 0.2–0.9)
Monocytes Relative: 2 %
Neutro Abs: 18.4 10*3/uL — ABNORMAL HIGH (ref 1.4–6.5)
Neutrophils Relative %: 96 %
PLATELETS: 288 10*3/uL (ref 150–440)
RBC: 4.9 MIL/uL (ref 3.80–5.20)
RDW: 14.1 % (ref 11.5–14.5)
WBC: 19.3 10*3/uL — AB (ref 3.6–11.0)

## 2016-06-14 LAB — TROPONIN I
Troponin I: 0.06 ng/mL (ref <0.03)
Troponin I: 0.03 ng/mL (ref <0.03)

## 2016-06-14 LAB — FIBRIN DERIVATIVES D-DIMER (ARMC ONLY): FIBRIN DERIVATIVES D-DIMER (ARMC): 972 — AB (ref 0–499)

## 2016-06-14 LAB — BRAIN NATRIURETIC PEPTIDE: B NATRIURETIC PEPTIDE 5: 194 pg/mL — AB (ref 0.0–100.0)

## 2016-06-14 MED ORDER — METOPROLOL TARTRATE 25 MG PO TABS
25.0000 mg | ORAL_TABLET | Freq: Two times a day (BID) | ORAL | Status: DC
  Administered 2016-06-14 – 2016-06-15 (×2): 25 mg via ORAL
  Filled 2016-06-14 (×2): qty 1

## 2016-06-14 MED ORDER — FLUTICASONE PROPIONATE 50 MCG/ACT NA SUSP
2.0000 | Freq: Every day | NASAL | Status: DC
  Administered 2016-06-15 – 2016-06-17 (×3): 2 via NASAL
  Filled 2016-06-14: qty 16

## 2016-06-14 MED ORDER — POTASSIUM CHLORIDE CRYS ER 20 MEQ PO TBCR
20.0000 meq | EXTENDED_RELEASE_TABLET | Freq: Once | ORAL | Status: AC
  Administered 2016-06-14: 20 meq via ORAL
  Filled 2016-06-14: qty 1

## 2016-06-14 MED ORDER — DILTIAZEM HCL 25 MG/5ML IV SOLN
10.0000 mg | INTRAVENOUS | Status: DC | PRN
  Administered 2016-06-14: 10 mg via INTRAVENOUS
  Filled 2016-06-14: qty 5

## 2016-06-14 MED ORDER — FUROSEMIDE 10 MG/ML IJ SOLN
40.0000 mg | Freq: Once | INTRAMUSCULAR | Status: AC
  Administered 2016-06-14: 40 mg via INTRAVENOUS
  Filled 2016-06-14: qty 4

## 2016-06-14 MED ORDER — SODIUM CHLORIDE 0.9% FLUSH: 3.0000 mL | INTRAVENOUS | Status: DC | PRN

## 2016-06-14 MED ORDER — POTASSIUM CHLORIDE CRYS ER 20 MEQ PO TBCR
20.0000 meq | EXTENDED_RELEASE_TABLET | Freq: Every day | ORAL | Status: DC
  Administered 2016-06-15 – 2016-06-17 (×3): 20 meq via ORAL
  Filled 2016-06-14 (×3): qty 1

## 2016-06-14 MED ORDER — GUAIFENESIN ER 600 MG PO TB12
600.0000 mg | ORAL_TABLET | Freq: Two times a day (BID) | ORAL | Status: DC
  Administered 2016-06-14 – 2016-06-17 (×6): 600 mg via ORAL
  Filled 2016-06-14 (×6): qty 1

## 2016-06-14 MED ORDER — ALBUTEROL SULFATE (2.5 MG/3ML) 0.083% IN NEBU
2.5000 mg | INHALATION_SOLUTION | Freq: Four times a day (QID) | RESPIRATORY_TRACT | Status: DC
  Administered 2016-06-14 – 2016-06-16 (×6): 2.5 mg via RESPIRATORY_TRACT
  Filled 2016-06-14 (×7): qty 3

## 2016-06-14 MED ORDER — ONDANSETRON HCL 4 MG PO TABS: 4.0000 mg | ORAL_TABLET | Freq: Four times a day (QID) | ORAL | Status: DC | PRN

## 2016-06-14 MED ORDER — IPRATROPIUM-ALBUTEROL 0.5-2.5 (3) MG/3ML IN SOLN
3.0000 mL | Freq: Once | RESPIRATORY_TRACT | Status: AC
  Administered 2016-06-14: 3 mL via RESPIRATORY_TRACT
  Filled 2016-06-14: qty 3

## 2016-06-14 MED ORDER — ACETAMINOPHEN 650 MG RE SUPP: 650.0000 mg | Freq: Four times a day (QID) | RECTAL | Status: DC | PRN

## 2016-06-14 MED ORDER — PRAVASTATIN SODIUM 40 MG PO TABS
40.0000 mg | ORAL_TABLET | Freq: Every day | ORAL | Status: DC
  Administered 2016-06-15 – 2016-06-16 (×2): 40 mg via ORAL
  Filled 2016-06-14 (×3): qty 1

## 2016-06-14 MED ORDER — NITROGLYCERIN 2 % TD OINT
1.0000 [in_us] | TOPICAL_OINTMENT | Freq: Four times a day (QID) | TRANSDERMAL | Status: DC
  Administered 2016-06-14 – 2016-06-15 (×4): 1 [in_us] via TOPICAL
  Filled 2016-06-14 (×4): qty 1

## 2016-06-14 MED ORDER — TRIAMTERENE-HCTZ 37.5-25 MG PO TABS
0.5000 | ORAL_TABLET | Freq: Every day | ORAL | Status: DC
  Administered 2016-06-15 – 2016-06-17 (×3): 0.5 via ORAL
  Filled 2016-06-14 (×2): qty 1
  Filled 2016-06-14 (×2): qty 0.5

## 2016-06-14 MED ORDER — ASPIRIN EC 81 MG PO TBEC
81.0000 mg | DELAYED_RELEASE_TABLET | Freq: Every day | ORAL | Status: DC
  Administered 2016-06-14 – 2016-06-17 (×4): 81 mg via ORAL
  Filled 2016-06-14 (×4): qty 1

## 2016-06-14 MED ORDER — TIOTROPIUM BROMIDE MONOHYDRATE 18 MCG IN CAPS
18.0000 ug | ORAL_CAPSULE | Freq: Every day | RESPIRATORY_TRACT | Status: DC
  Administered 2016-06-15 – 2016-06-17 (×3): 18 ug via RESPIRATORY_TRACT
  Filled 2016-06-14 (×2): qty 5

## 2016-06-14 MED ORDER — ASPIRIN EC 81 MG PO TBEC: 81.0000 mg | DELAYED_RELEASE_TABLET | Freq: Every day | ORAL | Status: DC

## 2016-06-14 MED ORDER — LOSARTAN POTASSIUM 50 MG PO TABS
100.0000 mg | ORAL_TABLET | Freq: Every day | ORAL | Status: DC
Start: 2016-06-15 — End: 2016-06-17
  Administered 2016-06-15 – 2016-06-17 (×3): 100 mg via ORAL
  Filled 2016-06-14 (×3): qty 2

## 2016-06-14 MED ORDER — DILTIAZEM HCL 25 MG/5ML IV SOLN
10.0000 mg | Freq: Once | INTRAVENOUS | Status: AC
  Administered 2016-06-14: 10 mg via INTRAVENOUS
  Filled 2016-06-14: qty 5

## 2016-06-14 MED ORDER — DEXTROSE 5 % IV SOLN
500.0000 mg | INTRAVENOUS | Status: DC
  Administered 2016-06-14 – 2016-06-16 (×3): 500 mg via INTRAVENOUS
  Filled 2016-06-14 (×4): qty 500

## 2016-06-14 MED ORDER — SODIUM CHLORIDE 0.9% FLUSH
3.0000 mL | Freq: Two times a day (BID) | INTRAVENOUS | Status: DC
  Administered 2016-06-15 – 2016-06-17 (×3): 3 mL via INTRAVENOUS

## 2016-06-14 MED ORDER — ONDANSETRON HCL 4 MG/2ML IJ SOLN: 4.0000 mg | Freq: Four times a day (QID) | INTRAMUSCULAR | Status: DC | PRN

## 2016-06-14 MED ORDER — FUROSEMIDE 10 MG/ML IJ SOLN
20.0000 mg | Freq: Two times a day (BID) | INTRAMUSCULAR | Status: DC
  Administered 2016-06-15 – 2016-06-17 (×5): 20 mg via INTRAVENOUS
  Filled 2016-06-14 (×5): qty 2

## 2016-06-14 MED ORDER — VITAMIN D 1000 UNITS PO TABS
1000.0000 [IU] | ORAL_TABLET | Freq: Every day | ORAL | Status: DC
  Administered 2016-06-15 – 2016-06-17 (×3): 1000 [IU] via ORAL
  Filled 2016-06-14 (×4): qty 1

## 2016-06-14 MED ORDER — ACETAMINOPHEN 325 MG PO TABS: 650.0000 mg | ORAL_TABLET | Freq: Four times a day (QID) | ORAL | Status: DC | PRN

## 2016-06-14 MED ORDER — AMLODIPINE BESYLATE 5 MG PO TABS
5.0000 mg | ORAL_TABLET | Freq: Every day | ORAL | Status: DC
  Administered 2016-06-15: 5 mg via ORAL
  Filled 2016-06-14: qty 1

## 2016-06-14 MED ORDER — ENOXAPARIN SODIUM 40 MG/0.4ML ~~LOC~~ SOLN
40.0000 mg | SUBCUTANEOUS | Status: DC
  Administered 2016-06-14 – 2016-06-16 (×3): 40 mg via SUBCUTANEOUS
  Filled 2016-06-14 (×3): qty 0.4

## 2016-06-14 MED ORDER — SODIUM CHLORIDE 0.9% FLUSH
3.0000 mL | Freq: Two times a day (BID) | INTRAVENOUS | Status: DC
  Administered 2016-06-14 – 2016-06-16 (×6): 3 mL via INTRAVENOUS

## 2016-06-14 MED ORDER — DILTIAZEM HCL 30 MG PO TABS
30.0000 mg | ORAL_TABLET | Freq: Four times a day (QID) | ORAL | Status: DC
  Administered 2016-06-14 – 2016-06-15 (×4): 30 mg via ORAL
  Filled 2016-06-14 (×6): qty 1

## 2016-06-14 MED ORDER — SODIUM CHLORIDE 0.9 % IV SOLN: 250.0000 mL | INTRAVENOUS | Status: DC | PRN

## 2016-06-14 NOTE — ED Notes (Signed)
Pt up to bathroom with assistance.  Pt alert   No chest pain.  Skin warm and dry.

## 2016-06-14 NOTE — ED Notes (Signed)
Resumed care from Latrish Mogel t rn.  Pt alert.  No chest pain.   Pt has sob.  Pt up to bathroom with assistance.

## 2016-06-14 NOTE — ED Notes (Signed)
Pt up to bathroom with assistance.  No chest pain.  Pt has sob.  Pt alert.

## 2016-06-14 NOTE — ED Notes (Signed)
Report to floor nurse Alecia RN

## 2016-06-14 NOTE — Plan of Care (Signed)
Problem: Activity: Goal: Ability to tolerate increased activity will improve Outcome: Not Progressing Admitted from ED.  Dyspnea with minimal exertion noted.  Pt encouraged to purse lip breath and conserve energy.

## 2016-06-14 NOTE — ED Provider Notes (Addendum)
[facility] Emergency Department Provider Note   ____________________________________________   First MD Initiated Contact with Patient 06/14/16 [id number]     (approximate)  I have reviewed the triage vital signs and the nursing notes.   HISTORY  Chief Complaint Shortness of Breath    HPI Tina [name] is a 82 y.o. female who comes in complaining of shortness of breath. She reports she got sick after being in the hospital with her sister who is being admitted for 36 hours. She said she went home and her legs it swelled up so much it trouble getting her pants off she's never had that before. Since then she's been having coughing and wheezing and shortness of breath. She was seen in the hospital Monday and got a breathing treatment and steroids. She still taking steroids 50 mg once a day. She is using her nebulizer at home every 4 hours. However she is still short of breath wheezing and coughing and feeling bad. She she says the cough is productive of phlegm of the flap was not coming up all the way since she can't tell what color it is. She feels like her COPD is acting up.   Past Medical History:  Diagnosis Date  . Anemia   . Asthma   . BRCA positive   . CAD (coronary artery disease)   . COPD (chronic obstructive pulmonary disease) ([place])   . Emphysema lung ([place])   . GERD (gastroesophageal reflux disease)   . History of colon polyps   . Hypercholesterolemia   . Hyperglycemia   . Hypertension   . Lung nodules   . Osteoporosis   . Personal history of tobacco use, presenting hazards to health 11/25/2014  . Polycythemia vera(238.4)   . Renal cyst   . Skin cancer     Patient Active Problem List   Diagnosis Date Noted  . Abdominal bruit 04/16/2016  . Abnormal mammogram 06/06/2015  . Essential hypertension 02/16/2015  . Numbness 01/24/2015  . Personal history of tobacco use, presenting hazards to health 11/25/2014  . BRCA positive   . COPD exacerbation  (Between) 11/14/2014  . Sensation of pressure in bladder area 08/15/2014  . Health care maintenance 08/15/2014  . Awareness of heartbeats 09/30/2013  . Presence of coronary angioplasty implant and graft 09/30/2013  . Acute confusional state 08/10/2013  . History of colonic polyps 08/25/2012  . Female genuine stress incontinence 06/28/2012  . Infection of urinary tract 06/28/2012  . CAD (coronary artery disease) 05/23/2012  . COPD (chronic obstructive pulmonary disease) ([place]) 05/23/2012  . Hypertension 05/23/2012  . Hypercholesterolemia 05/23/2012  . Hyperglycemia 05/23/2012  . Polycythemia, secondary 05/23/2012  . Renal cyst 05/23/2012  . Osteoporosis 05/23/2012    Past Surgical History:  Procedure Laterality Date  . ANGIOPLASTY     coronary (x1)  . COLONOSCOPY WITH PROPOFOL N/A 03/02/2015   Procedure: COLONOSCOPY WITH PROPOFOL;  Surgeon: Lollie Sails, MD;  Location[facility] ENDOSCOPY;  Service: Endoscopy;  Laterality: N/A;  . CORONARY ANGIOPLASTY    . CORONARY ARTERY BYPASS GRAFT    . SALPINGOOPHORECTOMY    . TONSILECTOMY/ADENOIDECTOMY WITH MYRINGOTOMY    . TUBAL LIGATION      Prior to Admission medications   Medication Sig Start Date End Date Taking? Authorizing Provider  amLODipine (NORVASC) 5 MG tablet TAKE ONE TABLET BY MOUTH EVERY DAY 05/10/16  Yes Einar Pheasant, MD  aspirin EC 81 MG tablet Take 81 mg by mouth daily.   Yes Historical Provider, MD  Calcium Carbonate-Vitamin D (CALCIUM-D) 600-400 MG-UNIT TABS Take 1 tablet by mouth daily.   Yes Historical Provider, MD  cholecalciferol (VITAMIN D) 1000 units tablet Take 1,000 Units by mouth daily.   Yes Historical Provider, MD  fluticasone (FLONASE) 50 MCG/ACT nasal spray Place 2 sprays into the nose daily. 10/10/12  Yes Einar Pheasant, MD  ipratropium-albuterol (DUONEB) 0.5-2.5 (3) MG/3ML SOLN Take 3 mLs by nebulization every 6 (six) hours as needed (for shortness of breath). 11/14/14  Yes Joanne Gavel, MD  losartan (COZAAR)  100 MG tablet TAKE ONE TABLET BY MOUTH EVERY DAY 06/12/16  Yes Einar Pheasant, MD  lovastatin (MEVACOR) 40 MG tablet Take 1 tablet by mouth  daily 12/27/15  Yes Einar Pheasant, MD  metoprolol tartrate (LOPRESSOR) 25 MG tablet Take one-half tablet by  mouth two times daily 01/31/16  Yes Einar Pheasant, MD  predniSONE (DELTASONE) 50 MG tablet Take 1 tablet (50 mg total) by mouth daily with breakfast. 06/12/16  Yes Lavonia Drafts, MD  SYMBICORT 160-4.5 MCG/ACT inhaler Use 2 puffs two times daily 03/08/15  Yes Einar Pheasant, MD  triamterene-hydrochlorothiazide (CHYIFOY-77) 37.5-25 MG tablet TAKE 1 TABLET BY MOUTH  DAILY Patient taking differently: TAKE 1/2 TABLET BY MOUTH ONCE DAILY 06/06/16  Yes Einar Pheasant, MD    Allergies Evista [raloxifene] and Fluticasone-salmeterol  Family History  Problem Relation Age of Onset  . Cirrhosis Father     died age 31  . Alcohol abuse Father   . Asthma Mother   . Congestive Heart Failure Mother   . Breast cancer Mother     2 (1/2 sisters)  . Osteoarthritis Mother   . Colon cancer Mother   . Lupus Sister   . Alcohol abuse Sister   . Ovarian cancer Sister   . Osteoporosis Sister   . Skin cancer Sister   . Breast cancer Cousin   . Breast cancer Maternal Aunt     Social History Social History  Substance Use Topics  . Smoking status: Former Smoker    Packs/day: 1.00    Years: 45.00    Types: Cigarettes    Quit date: 11/11/2003  . Smokeless tobacco: Never Used  . Alcohol use No     Comment: occasional    Review of Systems Constitutional: No fever/chills Eyes: No visual changes. ENT: No sore throat. Cardiovascular: Denies chest pain. Respiratory:  shortness of breath. Gastrointestinal: No abdominal pain.  No nausea, no vomiting.  No diarrhea.  No constipation. Genitourinary: Negative for dysuria. Musculoskeletal: Negative for back pain. Skin: Negative for rash. Neurological: Negative for headaches, focal weakness or numbness.  10-point ROS  otherwise negative.  ____________________________________________   PHYSICAL EXAM:  VITAL SIGNS: ED Triage Vitals  Enc Vitals Group     BP 06/14/16 0909 (!) 124/93     Pulse Rate 06/14/16 0909 80     Resp 06/14/16 0909 20     Temp 06/14/16 0909 98 F (36.7 C)     Temp Source 06/14/16 0909 Oral     SpO2 06/14/16 0909 95 %     Weight 06/14/16 0908 140 lb (63.5 kg)     Height 06/14/16 0908 '5\' 5"'  (1.651 m)     Head Circumference --      Peak Flow --      Pain Score 06/14/16 0908 0     Pain Loc --      Pain Edu? --      Excl. in [place]? --     Constitutional: Alert and oriented.  Well appearing and in no acute distress. Eyes: Conjunctivae are normal. PERRL. EOMI. Head: Atraumatic. Nose: No congestion/rhinnorhea. Mouth/Throat: Mucous membranes are moist.  Oropharynx non-erythematous. Neck: No stridor. Cardiovascular: Normal rate, regular rhythm. Grossly normal heart sounds.  Good peripheral circulation. Respiratory: Somewhat increased respiratory effort.  Some retractions. Lungs diffuse wheezes Gastrointestinal: Soft and nontender. No distention. No abdominal bruits. No CVA tenderness. Musculoskeletal: No lower extremity tenderness nor edema.  No joint effusions. Neurologic:  Normal speech and language. No gross focal neurologic deficits are appreciated. No gait instability. Skin:  Skin is warm, dry and intact. No rash noted. Psychiatric: Mood and affect are normal. Speech and behavior are normal.  ____________________________________________   LABS (all labs ordered are listed, but only abnormal results are displayed)  Labs Reviewed  COMPREHENSIVE METABOLIC PANEL - Abnormal; Notable for the following:       Result Value   Chloride 100 (*)    Glucose, Bld 189 (*)    BUN 45 (*)    Creatinine, Ser 1.27 (*)    GFR calc non Af Amer 39 (*)    GFR calc Af Amer 45 (*)    All other components within normal limits  BRAIN NATRIURETIC PEPTIDE - Abnormal; Notable for the following:      B Natriuretic Peptide 194.0 (*)    All other components within normal limits  TROPONIN I - Abnormal; Notable for the following:    Troponin I 0.06 (*)    All other components within normal limits  CBC WITH DIFFERENTIAL/PLATELET - Abnormal; Notable for the following:    WBC 19.3 (*)    Neutro Abs 18.4 (*)    Lymphs Abs 0.4 (*)    All other components within normal limits   ____________________________________________  EKG  EKG was done but is not findable ____________________________________________  RADIOLOGY  Chest x-ray was done but has been lost ____________________________________________   PROCEDURES  Procedure(s) performed:   Procedures  Critical Care performed:   ____________________________________________   INITIAL IMPRESSION / ASSESSMENT AND PLAN / ED COURSE  Pertinent labs & imaging results that were available during my care of the patient were reviewed by me and considered in my medical decision making (see chart for details).     patient becomes very short of breath and barely is able to go from the toilet in the room to the bed. ____________________________________________   FINAL CLINICAL IMPRESSION(S) / ED DIAGNOSES  Final diagnoses:  COPD exacerbation (HCC)  Elevated troponin  Dyspnea, unspecified type      NEW MEDICATIONS STARTED DURING THIS VISIT:  New Prescriptions   No medications on file     Note:  This document was prepared using Dragon voice recognition software and may include unintentional dictation errors.    Nena Polio, MD 06/14/16 [place], MD 06/14/16 [phone]

## 2016-06-14 NOTE — Progress Notes (Signed)
Order for enoxaparin 30 mg subQ daily changed to 40 mg daily dose per anticoagulation protocol for CrCl > 30 mL/min.   Lenis Noon, PharmD

## 2016-06-14 NOTE — ED Triage Notes (Signed)
States SOB since Monday, hx of COPD, states she was seen in ED on Monday and sent home but continues to feel bad, states dry cough, speaking in full clear sentences

## 2016-06-14 NOTE — H&P (Signed)
[facility] [facility] [place] NAME: Tina [name]    MR#:  [id number]  DATE OF BIRTH:  December 19, 1934  DATE OF ADMISSION:  06/14/2016  PRIMARY CARE PHYSICIAN: Einar Pheasant, MD   REQUESTING/REFERRING PHYSICIAN:   CHIEF COMPLAINT:   Chief Complaint  Patient presents with  . Shortness of Breath    HISTORY OF PRESENT ILLNESS: Tina [name]  is a 82 y.o. female with a known history of Asthma, coronary artery disease, emphysema, hypertension, hyperlipidemia, osteoporosis, former tobacco abuse, who quit about 15 years ago, who presents to the hospital with complaints of worsening shortness of breath, and oximeter swelling. According to the patient, she started noticing lower extremity swelling about 2 weeks ago, she gained approximately 5 pounds, she started having shortness of breath associated with this regimen presented to emergency room, was given steroids as well as inhalation therapy, has been using these, however, is not improving, she has progressive shortness of breath and some chest tightness, especially whenever she coughs. She came into the hospital again due to orthopnea, worsening shortness of breath and leg swelling. Chest x-ray was unremarkable. BNP was elevated as well as  troponin, hospitalist services were contacted for admission. She endorses orthopnea.   PAST MEDICAL HISTORY:   Past Medical History:  Diagnosis Date  . Anemia   . Asthma   . BRCA positive   . CAD (coronary artery disease)   . COPD (chronic obstructive pulmonary disease) ([place])   . Emphysema lung ([place])   . GERD (gastroesophageal reflux disease)   . History of colon polyps   . Hypercholesterolemia   . Hyperglycemia   . Hypertension   . Lung nodules   . Osteoporosis   . Personal history of tobacco use, presenting hazards to health 11/25/2014  . Polycythemia vera(238.4)   . Renal cyst   . Skin cancer     PAST SURGICAL HISTORY: Past Surgical History:  Procedure  Laterality Date  . ANGIOPLASTY     coronary (x1)  . COLONOSCOPY WITH PROPOFOL N/A 03/02/2015   Procedure: COLONOSCOPY WITH PROPOFOL;  Surgeon: Lollie Sails, MD;  Location: [facility] ENDOSCOPY;  Service: Endoscopy;  Laterality: N/A;  . CORONARY ANGIOPLASTY    . CORONARY ARTERY BYPASS GRAFT    . SALPINGOOPHORECTOMY    . TONSILECTOMY/ADENOIDECTOMY WITH MYRINGOTOMY    . TUBAL LIGATION      SOCIAL HISTORY:  Social History  Substance Use Topics  . Smoking status: Former Smoker    Packs/day: 1.00    Years: 45.00    Types: Cigarettes    Quit date: 11/11/2003  . Smokeless tobacco: Never Used  . Alcohol use No     Comment: occasional    FAMILY HISTORY:  Family History  Problem Relation Age of Onset  . Cirrhosis Father     died age 59  . Alcohol abuse Father   . Asthma Mother   . Congestive Heart Failure Mother   . Breast cancer Mother     2 (1/2 sisters)  . Osteoarthritis Mother   . Colon cancer Mother   . Lupus Sister   . Alcohol abuse Sister   . Ovarian cancer Sister   . Osteoporosis Sister   . Skin cancer Sister   . Breast cancer Cousin   . Breast cancer Maternal Aunt     DRUG ALLERGIES:  Allergies  Allergen Reactions  . Evista [Raloxifene] Other (See Comments)    Night sweats Night sweats Reaction:  Night sweats   .  Fluticasone-Salmeterol Other (See Comments)    Reaction:  Cough     Review of Systems  Constitutional: Negative for chills, fever and weight loss.  HENT: Positive for congestion.   Eyes: Negative for blurred vision and double vision.  Respiratory: Positive for cough, shortness of breath and wheezing. Negative for sputum production.   Cardiovascular: Positive for chest pain, orthopnea and leg swelling. Negative for palpitations and PND.  Gastrointestinal: Negative for abdominal pain, blood in stool, constipation, diarrhea, nausea and vomiting.  Genitourinary: Negative for dysuria, frequency, hematuria and urgency.  Musculoskeletal: Negative for  falls.  Neurological: Negative for dizziness, tremors, focal weakness and headaches.  Endo/Heme/Allergies: Does not bruise/bleed easily.  Psychiatric/Behavioral: Negative for depression. The patient does not have insomnia.     MEDICATIONS AT HOME:  Prior to Admission medications   Medication Sig Start Date End Date Taking? Authorizing Provider  amLODipine (NORVASC) 5 MG tablet TAKE ONE TABLET BY MOUTH EVERY DAY 05/10/16  Yes Einar Pheasant, MD  aspirin EC 81 MG tablet Take 81 mg by mouth daily.   Yes Historical Provider, MD  Calcium Carbonate-Vitamin D (CALCIUM-D) 600-400 MG-UNIT TABS Take 1 tablet by mouth daily.   Yes Historical Provider, MD  cholecalciferol (VITAMIN D) 1000 units tablet Take 1,000 Units by mouth daily.   Yes Historical Provider, MD  fluticasone (FLONASE) 50 MCG/ACT nasal spray Place 2 sprays into the nose daily. 10/10/12  Yes Einar Pheasant, MD  ipratropium-albuterol (DUONEB) 0.5-2.5 (3) MG/3ML SOLN Take 3 mLs by nebulization every 6 (six) hours as needed (for shortness of breath). 11/14/14  Yes Joanne Gavel, MD  losartan (COZAAR) 100 MG tablet TAKE ONE TABLET BY MOUTH EVERY DAY 06/12/16  Yes Einar Pheasant, MD  lovastatin (MEVACOR) 40 MG tablet Take 1 tablet by mouth  daily 12/27/15  Yes Einar Pheasant, MD  metoprolol tartrate (LOPRESSOR) 25 MG tablet Take one-half tablet by  mouth two times daily 01/31/16  Yes Einar Pheasant, MD  predniSONE (DELTASONE) 50 MG tablet Take 1 tablet (50 mg total) by mouth daily with breakfast. 06/12/16  Yes Lavonia Drafts, MD  SYMBICORT 160-4.5 MCG/ACT inhaler Use 2 puffs two times daily 03/08/15  Yes Einar Pheasant, MD  triamterene-hydrochlorothiazide (VZCHYIF-02) 37.5-25 MG tablet TAKE 1 TABLET BY MOUTH  DAILY Patient taking differently: TAKE 1/2 TABLET BY MOUTH ONCE DAILY 06/06/16  Yes Einar Pheasant, MD      PHYSICAL EXAMINATION:   VITAL SIGNS: Blood pressure (!) 148/62, pulse 80, temperature 98 F (36.7 C), temperature source Oral, resp.  rate 17, height '5\' 5"'  (1.651 m), weight 63.5 kg (140 lb), SpO2 94 %.  GENERAL:  82 y.o.-year-old patient lying in the bed in moderate to severe respiratory distress, wheezing, tachypnea, very uncomfortable struggling.  EYES: Pupils equal, round, reactive to light and accommodation. No scleral icterus. Extraocular muscles intact.  HEENT: Head atraumatic, normocephalic. Oropharynx and nasopharynx clear.  NECK:  Supple, no jugular venous distention. No thyroid enlargement, no tenderness.  LUNGS: Normal breath sounds bilaterally, scattered wheezing, diffuse bilateral rales,rhonchi and crepitations. Using accessory muscles of respiration.  CARDIOVASCULAR: S1, S2 , irregularly irregular, tachycardic. No murmurs, rubs, or gallops.  ABDOMEN: Soft, nontender, nondistended. Bowel sounds present. No organomegaly or mass.  EXTREMITIES: 1+ looks summary and pedal edema, no cyanosis, or clubbing. Dorsalis pedis pulses are 2+ NEUROLOGIC: Cranial nerves II through XII are intact. Muscle strength 5/5 in all extremities. Sensation intact. Gait not checked.  PSYCHIATRIC: The patient is alert and oriented x 3.  SKIN: No obvious rash, lesion,  or ulcer.   LABORATORY PANEL:   CBC  Recent Labs Lab 06/12/16 0950 06/14/16 0958  WBC 11.7* 19.3*  HGB 15.8 15.3  HCT 45.4 44.0  PLT 195 288  MCV 89.6 89.7  MCH 31.1 31.2  MCHC 34.7 34.8  RDW 14.2 14.1  LYMPHSABS 0.9* 0.4*  MONOABS 1.0* 0.4  EOSABS 0.0 0.0  BASOSABS 0.1 0.1   ------------------------------------------------------------------------------------------------------------------  Chemistries   Recent Labs Lab 06/14/16 0958  NA 135  K 3.7  CL 100*  CO2 24  GLUCOSE 189*  BUN 45*  CREATININE 1.27*  CALCIUM 9.8  AST 33  ALT 26  ALKPHOS 54  BILITOT 0.6   ------------------------------------------------------------------------------------------------------------------  Cardiac Enzymes  Recent Labs Lab 06/12/16 0950 06/14/16 0958   TROPONINI <0.03 0.06*   ------------------------------------------------------------------------------------------------------------------  RADIOLOGY: Dg Chest Portable 1 View  Result Date: 06/14/2016 CLINICAL DATA:  Shortness of breath, dry cough. EXAM: PORTABLE CHEST 1 VIEW COMPARISON:  06/12/2016 and CT chest 12/02/2014. FINDINGS: Trachea is midline. Heart size stable. Thoracic aorta is calcified. Minimal biapical pleural thickening. Lungs appear mildly hyperinflated but clear. No pleural fluid. IMPRESSION: Hyperinflation without acute finding. Electronically Signed   By: Lorin Picket M.D.   On: 06/14/2016 09:54    EKG: Orders placed or performed during the hospital encounter of 06/14/16  . ED EKG  . ED EKG  . EKG 12-Lead  . EKG 12-Lead  . ED EKG  . ED EKG  . EKG 12-Lead  . EKG 12-Lead   EKG is pending IMPRESSION AND PLAN:  Active Problems:   COPD exacerbation (HCC)   Acute bronchitis   Elevated troponin   CKD (chronic kidney disease), stage III   Leukocytosis  #1. COPD exacerbation due to acute bronchitis, admit patient to medical floor, continue her on steroids, nebulizing therapy, initiate patient on Zithromax, get sputum cultures if possible #2. Acute CHF, initiate Lasix, get echocardiogram, follow ins and outs, weight #3. Bronchitis, Zithromax, get sputum cultures if possible #4. CK D stage III, follow with diuresis, #5. A. fib, RVR, initiate patient on Cardizem intravenously as needed, continue metoprolol, add Cardizem orally #6. Elevated troponin, likely demand ischemia, follow cardiac enzymes 3, continue metoprolol, aspirin, nitroglycerin, Lovenox, get cardiologist involved, get echo #7. Malignant essential hypertension, likely stress related, continue outpatient medications, Cardizem is added, nitroglycerin topically All the records are reviewed and case discussed with ED provider. Management plans discussed with the patient, family and they are in  agreement.  CODE STATUS: Code Status History    Date Active Date Inactive Code Status Order ID Comments User Context   11/14/2014  8:57 AM 11/15/2014  3:57 PM Full Code [id number]  Aldean Jewett, MD Inpatient       TOTAL TIME TAKING CARE OF THIS PATIENT: 60 minutes.    Theodoro Grist M.D on 06/14/2016 at 2:52 PM  Between 7am to 6pm - Pager - [phone] After 6pm go to [email or web address] - password [email or web address] [facility]  Office  [phone]  CC: Primary care physician; Einar Pheasant, MD

## 2016-06-14 NOTE — ED Notes (Signed)
Iv started and meds given.  Pt alert.  afib on monitor.  No chest pain.

## 2016-06-14 NOTE — ED Notes (Signed)
Pt alert.  Watching tv.  No acute distress.   afib on monitor95-120.  No chest pain.  Iv in place.

## 2016-06-15 ENCOUNTER — Inpatient Hospital Stay
Admit: 2016-06-15 | Discharge: 2016-06-15 | Disposition: A | Discharge status: Home / Self Care | Payer: Medicare Other | Attending: Internal Medicine | Admitting: Internal Medicine

## 2016-06-15 ENCOUNTER — Inpatient Hospital Stay: Payer: Medicare Other

## 2016-06-15 LAB — BASIC METABOLIC PANEL
ANION GAP: 9 (ref 5–15)
BUN: 41 mg/dL — ABNORMAL HIGH (ref 6–20)
CALCIUM: 10 mg/dL (ref 8.9–10.3)
CO2: 29 mmol/L (ref 22–32)
CREATININE: 1.21 mg/dL — AB (ref 0.44–1.00)
Chloride: 100 mmol/L — ABNORMAL LOW (ref 101–111)
GFR, EST AFRICAN AMERICAN: 48 mL/min — AB (ref >60)
GFR, EST NON AFRICAN AMERICAN: 41 mL/min — AB (ref >60)
Glucose, Bld: 134 mg/dL — ABNORMAL HIGH (ref 65–99)
Potassium: 3.4 mmol/L — ABNORMAL LOW (ref 3.5–5.1)
Sodium: 138 mmol/L (ref 135–145)

## 2016-06-15 LAB — ECHOCARDIOGRAM COMPLETE
Height: 65 in
WEIGHTICAEL: 2070.4 [oz_av]

## 2016-06-15 LAB — CBC
HCT: 45.6 % (ref 35.0–47.0)
HEMOGLOBIN: 15.7 g/dL (ref 12.0–16.0)
MCH: 30.8 pg (ref 26.0–34.0)
MCHC: 34.3 g/dL (ref 32.0–36.0)
MCV: 89.6 fL (ref 80.0–100.0)
PLATELETS: 297 10*3/uL (ref 150–440)
RBC: 5.09 MIL/uL (ref 3.80–5.20)
RDW: 13.9 % (ref 11.5–14.5)
WBC: 14.9 10*3/uL — ABNORMAL HIGH (ref 3.6–11.0)

## 2016-06-15 LAB — GLUCOSE, CAPILLARY: GLUCOSE-CAPILLARY: 136 mg/dL — AB (ref 65–99)

## 2016-06-15 LAB — TROPONIN I
TROPONIN I: 0.03 ng/mL — AB (ref <0.03)
TROPONIN I: 0.03 ng/mL — AB (ref <0.03)

## 2016-06-15 MED ORDER — METOPROLOL TARTRATE 50 MG PO TABS
50.0000 mg | ORAL_TABLET | Freq: Two times a day (BID) | ORAL | Status: DC
  Administered 2016-06-15 – 2016-06-16 (×2): 50 mg via ORAL
  Filled 2016-06-15 (×2): qty 1

## 2016-06-15 MED ORDER — IOPAMIDOL (ISOVUE-370) INJECTION 76%
60.0000 mL | Freq: Once | INTRAVENOUS | Status: AC | PRN
  Administered 2016-06-15: 60 mL via INTRAVENOUS

## 2016-06-15 MED ORDER — AMLODIPINE BESYLATE 5 MG PO TABS
5.0000 mg | ORAL_TABLET | Freq: Once | ORAL | Status: AC
  Administered 2016-06-15: 5 mg via ORAL
  Filled 2016-06-15: qty 1

## 2016-06-15 MED ORDER — POTASSIUM CHLORIDE CRYS ER 20 MEQ PO TBCR
40.0000 meq | EXTENDED_RELEASE_TABLET | ORAL | Status: AC
  Administered 2016-06-15 (×2): 40 meq via ORAL
  Filled 2016-06-15 (×2): qty 2

## 2016-06-15 MED ORDER — AMLODIPINE BESYLATE 10 MG PO TABS
10.0000 mg | ORAL_TABLET | Freq: Every day | ORAL | Status: DC
  Administered 2016-06-16 – 2016-06-17 (×2): 10 mg via ORAL
  Filled 2016-06-15 (×2): qty 1

## 2016-06-15 MED ORDER — METHYLPREDNISOLONE SODIUM SUCC 40 MG IJ SOLR
40.0000 mg | Freq: Three times a day (TID) | INTRAMUSCULAR | Status: DC
  Administered 2016-06-15 – 2016-06-16 (×3): 40 mg via INTRAVENOUS
  Filled 2016-06-15 (×3): qty 1

## 2016-06-15 NOTE — Consult Note (Signed)
[facility] Cardiology  CARDIOLOGY CONSULT NOTE  [name] MRN: [id number] DOB/AGE: 80-12-1934 80 y.o.  Admit date: 06/14/2016 Referring Physician Gouru Primary Physician [facility] Primary Cardiologist Paraschos Reason for Consultation Chest pain, suspected atrial fibrillation with RVR  HPI: 82 year old female referred for chest pain and atrial fibrillation with RVR. The patient has a history of CAD, status post stent OM1 12/2001, essential hypertension, hyperlipidemia, CKD, and COPD. Patient presented to [facility] ER for progressively worsening shortness of breath, lower extremity edema, productive cough, and pleuritic chest pain x 2 weeks. Admission labs notable for a WBC 19.3 and troponin 0.06. She was treated with steroids, nebulizers, and IV lasix, with improvement in symptoms. Currently, the patient reports chest pain when she coughs, and exertional shortness of breath, but denies palpitations. She states her lower extremity edema has greatly improved. She states that she does not feel fluid-overloaded.   Review of systems complete and found to be negative unless listed above     Past Medical History:  Diagnosis Date  . Anemia   . Asthma   . BRCA positive   . CAD (coronary artery disease)   . COPD (chronic obstructive pulmonary disease) ([place])   . Emphysema lung ([place])   . GERD (gastroesophageal reflux disease)   . History of colon polyps   . Hypercholesterolemia   . Hyperglycemia   . Hypertension   . Lung nodules   . Osteoporosis   . Personal history of tobacco use, presenting hazards to health 11/25/2014  . Polycythemia vera(238.4)   . Renal cyst   . Skin cancer     Past Surgical History:  Procedure Laterality Date  . ANGIOPLASTY     coronary (x1)  . COLONOSCOPY WITH PROPOFOL N/A 03/02/2015   Procedure: COLONOSCOPY WITH PROPOFOL;  Surgeon: Lollie Sails, MD;  Location: [facility] ENDOSCOPY;  Service: Endoscopy;  Laterality: N/A;  . CORONARY ANGIOPLASTY    . CORONARY ARTERY  BYPASS GRAFT    . SALPINGOOPHORECTOMY    . TONSILECTOMY/ADENOIDECTOMY WITH MYRINGOTOMY    . TUBAL LIGATION      Prescriptions Prior to Admission  Medication Sig Dispense Refill Last Dose  . amLODipine (NORVASC) 5 MG tablet TAKE ONE TABLET BY MOUTH EVERY DAY 30 tablet 0 06/14/2016 at 0445  . aspirin EC 81 MG tablet Take 81 mg by mouth daily.   prn at prn  . Calcium Carbonate-Vitamin D (CALCIUM-D) 600-400 MG-UNIT TABS Take 1 tablet by mouth daily.   06/13/2016 at qhs  . cholecalciferol (VITAMIN D) 1000 units tablet Take 1,000 Units by mouth daily.   06/13/2016 at am  . fluticasone (FLONASE) 50 MCG/ACT nasal spray Place 2 sprays into the nose daily. 16 g 5 06/14/2016 at 0445  . ipratropium-albuterol (DUONEB) 0.5-2.5 (3) MG/3ML SOLN Take 3 mLs by nebulization every 6 (six) hours as needed (for shortness of breath). 36 mL 0 06/14/2016 at 0430  . losartan (COZAAR) 100 MG tablet TAKE ONE TABLET BY MOUTH EVERY DAY 30 tablet 3 06/14/2016 at 0445  . lovastatin (MEVACOR) 40 MG tablet Take 1 tablet by mouth  daily 90 tablet 3 06/13/2016 at qhs  . metoprolol tartrate (LOPRESSOR) 25 MG tablet Take one-half tablet by  mouth two times daily 90 tablet 1 06/14/2016 at 0445  . predniSONE (DELTASONE) 50 MG tablet Take 1 tablet (50 mg total) by mouth daily with breakfast. 5 tablet 0 06/14/2016 at 0445  . SYMBICORT 160-4.5 MCG/ACT inhaler Use 2 puffs two times daily 30.6 g 3 06/14/2016 at Unknown  time  . triamterene-hydrochlorothiazide (MAXZIDE-25) 37.5-25 MG tablet TAKE 1 TABLET BY MOUTH  DAILY (Patient taking differently: TAKE 1/2 TABLET BY MOUTH ONCE DAILY) 90 tablet 1 06/14/2016 at Unknown time   Social History   Social History  . Marital status: Single    Spouse name: N/A  . Number of children: 2  . Years of education: N/A   Occupational History  . Not on file.   Social History Main Topics  . Smoking status: Former Smoker    Packs/day: 1.00    Years: 45.00    Types: Cigarettes    Quit date: 11/11/2003  .  Smokeless tobacco: Never Used  . Alcohol use No     Comment: occasional  . Drug use: No  . Sexual activity: Not on file   Other Topics Concern  . Not on file   Social History Narrative  . No narrative on file    Family History  Problem Relation Age of Onset  . Cirrhosis Father     died age 36  . Alcohol abuse Father   . Asthma Mother   . Congestive Heart Failure Mother   . Breast cancer Mother     2 (1/2 sisters)  . Osteoarthritis Mother   . Colon cancer Mother   . Lupus Sister   . Alcohol abuse Sister   . Ovarian cancer Sister   . Osteoporosis Sister   . Skin cancer Sister   . Breast cancer Cousin   . Breast cancer Maternal Aunt       Review of systems complete and found to be negative unless listed above      PHYSICAL EXAM  General: Well developed, well nourished, in no acute distress HEENT:  Normocephalic and atramatic Neck:  No JVD.  Lungs: Diffuse expiratory wheezing. No appreciable crackles Heart: HRRR . Normal S1 and S2 without gallops or murmurs.  Abdomen: Bowel sounds are positive, abdomen soft and non-tender  Msk:  Patient sitting upright in bed. Apparent normal muscle tone and strength for age Extremities: No clubbing, cyanosis or edema.   Neuro: Alert and oriented X 3. Psych:  Good affect, responds appropriately  Labs:   Lab Results  Component Value Date   WBC 14.9 (H) 06/15/2016   HGB 15.7 06/15/2016   HCT 45.6 06/15/2016   MCV 89.6 06/15/2016   PLT 297 06/15/2016    Recent Labs Lab 06/14/16 0958 06/15/16 0805  NA 135 138  K 3.7 3.4*  CL 100* 100*  CO2 24 29  BUN 45* 41*  CREATININE 1.27* 1.21*  CALCIUM 9.8 10.0  PROT 7.2  --   BILITOT 0.6  --   ALKPHOS 54  --   ALT 26  --   AST 33  --   GLUCOSE 189* 134*   Lab Results  Component Value Date   TROPONINI 0.03 (HH) 06/15/2016    Lab Results  Component Value Date   CHOL 158 02/16/2016   CHOL 173 09/30/2015   CHOL 161 05/28/2015   Lab Results  Component Value Date    HDL 60.10 02/16/2016   HDL 58.10 09/30/2015   HDL 69 05/28/2015   Lab Results  Component Value Date   LDLCALC 81 02/16/2016   LDLCALC 96 09/30/2015   LDLCALC 76 05/28/2015   Lab Results  Component Value Date   TRIG 86.0 02/16/2016   TRIG 93.0 09/30/2015   TRIG 78 05/28/2015   Lab Results  Component Value Date   CHOLHDL 3 02/16/2016   CHOLHDL  3 09/30/2015   CHOLHDL 2 08/11/2014   No results found for: LDLDIRECT    Radiology: Dg Chest 2 View  Result Date: 06/12/2016 CLINICAL DATA:  Shortness of breath for 4 days EXAM: CHEST  2 VIEW COMPARISON:  11/13/2014 FINDINGS: The heart size and mediastinal contours are within normal limits. Both lungs are hyperinflated. The visualized skeletal structures are unremarkable. IMPRESSION: COPD without acute abnormality. Electronically Signed   By: Inez Catalina M.D.   On: 06/12/2016 10:19   Dg Chest Portable 1 View  Result Date: 06/14/2016 CLINICAL DATA:  Shortness of breath, dry cough. EXAM: PORTABLE CHEST 1 VIEW COMPARISON:  06/12/2016 and CT chest 12/02/2014. FINDINGS: Trachea is midline. Heart size stable. Thoracic aorta is calcified. Minimal biapical pleural thickening. Lungs appear mildly hyperinflated but clear. No pleural fluid. IMPRESSION: Hyperinflation without acute finding. Electronically Signed   By: Lorin Picket M.D.   On: 06/14/2016 09:54    EKG: Sinus rhythm, 80 bpm  ASSESSMENT AND PLAN:  1. Sinus arrhthymia, no definitive evidence for atrial fibrillation. 2. Elevated troponin, in the absence of chest pain, likely demand-supply ischemia. 3. Suspected acute heart failure exacerbation in the setting of acute COPD exacerbation due to acute bronchitis, doing better after IV Lasix, and does not appear to be fluid-overloaded at this time.    Recommendations 1. Agree with current therapy. 2. Review echocardiogram 3. Defer full-dose anticoagulation for now. 4. Continue oral Cardizem for now and metoprolol tartrate  5. Continue  IV diuresis with careful monitoring of renal function. 6. Further cardiac diagnostic to be completed outpatient.  Signed: Clabe Seal, PA-C 06/15/2016, 10:15 AM

## 2016-06-15 NOTE — Progress Notes (Addendum)
[place] [place] NAME: Tina [name]    MR#:  [id number]  DATE OF BIRTH:  06/12/1934  SUBJECTIVE:  CHIEF COMPLAINT:  Shortness of breath is better. Patient is not allergic to steroids she could take prednisone by mouth on IV Solu-Medrol  REVIEW OF SYSTEMS:  CONSTITUTIONAL: No fever, fatigue or weakness.  EYES: No blurred or double vision.  EARS, NOSE, AND THROAT: No tinnitus or ear pain.  RESPIRATORY: Reporting some cough, shortness of breath, wheezing  CARDIOVASCULAR: No chest pain, orthopnea, edema.  GASTROINTESTINAL: No nausea, vomiting, diarrhea or abdominal pain.  GENITOURINARY: No dysuria, hematuria.  ENDOCRINE: No polyuria, nocturia,  HEMATOLOGY: No anemia, easy bruising or bleeding SKIN: No rash or lesion. MUSCULOSKELETAL: No joint pain or arthritis.   NEUROLOGIC: No tingling, numbness, weakness.  PSYCHIATRY: No anxiety or depression.   DRUG ALLERGIES:   Allergies  Allergen Reactions  . Evista [Raloxifene] Other (See Comments)    Night sweats Night sweats Reaction:  Night sweats   . Fluticasone-Salmeterol Other (See Comments)    Reaction:  Cough     VITALS:  Blood pressure (!) 164/68, pulse 77, temperature 98 F (36.7 C), temperature source Oral, resp. rate 20, height 5\' 5"  (1.651 m), weight 58.7 kg (129 lb 6.4 oz), SpO2 93 %.  PHYSICAL EXAMINATION:  GENERAL:  82 y.o.-year-old patient lying in the bed with no acute distress.  EYES: Pupils equal, round, reactive to light and accommodation. No scleral icterus. Extraocular muscles intact.  HEENT: Head atraumatic, normocephalic. Oropharynx and nasopharynx clear.  NECK:  Supple, no jugular venous distention. No thyroid enlargement, no tenderness.  LUNGS:  Moderately diminished breath soundS bilaterally, no wheezing, rales,rhonchi or crepitation. No use of accessory muscles of respiration.  CARDIOVASCULAR: S1, S2 normal. No murmurs, rubs, or gallops.  ABDOMEN: Soft,  nontender, nondistended. Bowel sounds present. No organomegaly or mass.  EXTREMITIES: No pedal edema, cyanosis, or clubbing.  NEUROLOGIC: Cranial nerves II through XII are intact. Muscle strength 5/5 in all extremities. Sensation intact. Gait not checked.  PSYCHIATRIC: The patient is alert and oriented x 3.  SKIN: No obvious rash, lesion, or ulcer.    LABORATORY PANEL:   CBC  Recent Labs Lab 06/15/16 0805  WBC 14.9*  HGB 15.7  HCT 45.6  PLT 297   ------------------------------------------------------------------------------------------------------------------  Chemistries   Recent Labs Lab 06/14/16 0958 06/15/16 0805  NA 135 138  K 3.7 3.4*  CL 100* 100*  CO2 24 29  GLUCOSE 189* 134*  BUN 45* 41*  CREATININE 1.27* 1.21*  CALCIUM 9.8 10.0  AST 33  --   ALT 26  --   ALKPHOS 54  --   BILITOT 0.6  --    ------------------------------------------------------------------------------------------------------------------  Cardiac Enzymes  Recent Labs Lab 06/15/16 0805  TROPONINI 0.03*   ------------------------------------------------------------------------------------------------------------------  RADIOLOGY:  Ct Angio Chest Pe W Or Wo Contrast  Result Date: 06/15/2016 CLINICAL DATA:  Chest pain, cough, exertional shortness of breath, lower extremity edema EXAM: CT ANGIOGRAPHY CHEST WITH CONTRAST TECHNIQUE: Multidetector CT imaging of the chest was performed using the standard protocol during bolus administration of intravenous contrast. Multiplanar CT image reconstructions and MIPs were obtained to evaluate the vascular anatomy. CONTRAST:  60 cc Isovue 370 COMPARISON:  Chest x-ray 06/14/2016 FINDINGS: Cardiovascular: The pulmonary arteries are well opacified. There is no evidence of acute pulmonary embolism. The thoracic aorta is not as well opacified but no acute abnormality is evident. There is moderate thoracic aortic atherosclerosis present. Diffuse  coronary artery  calcifications are noted. The heart is within normal limits in size. No pericardial effusion is seen. The mid ascending thoracic aorta measures 33 mm in diameter. Mediastinum/Nodes: No mediastinal or hilar adenopathy is noted. The thyroid gland is unremarkable. No hiatal hernia is seen. Lungs/Pleura: There are mild changes of centrilobular emphysema present. On lung window images, no lung parenchymal abnormality is noted. There is no evidence of infiltrate and no pleural effusion is seen. There may be some thickening of the bronchial walls which could indicate bronchitis. The central airway is patent. Upper Abdomen: The large cyst emanates from the upper pole of the left kidney. Otherwise the upper abdomen is unremarkable on the images obtained. The few minimally prominent lymph nodes present. Musculoskeletal: The thoracic vertebrae are in normal alignment. No acute thoracic compression deformity is seen. Review of the MIP images confirms the above findings. IMPRESSION: 1. Negative CT angiogram of the chest. No evidence of acute pulmonary embolism. 2. Moderate thoracic aortic atherosclerosis. 3. Mild centrilobular emphysema. 4. Somewhat thickened peribronchial walls could indicate bronchitis. Electronically Signed   By: Ivar Drape M.D.   On: 06/15/2016 13:19   Dg Chest Portable 1 View  Result Date: 06/14/2016 CLINICAL DATA:  Shortness of breath, dry cough. EXAM: PORTABLE CHEST 1 VIEW COMPARISON:  06/12/2016 and CT chest 12/02/2014. FINDINGS: Trachea is midline. Heart size stable. Thoracic aorta is calcified. Minimal biapical pleural thickening. Lungs appear mildly hyperinflated but clear. No pleural fluid. IMPRESSION: Hyperinflation without acute finding. Electronically Signed   By: Lorin Picket M.D.   On: 06/14/2016 09:54    EKG:   Orders placed or performed during the hospital encounter of 06/14/16  . ED EKG  . ED EKG  . EKG 12-Lead  . EKG 12-Lead  . ED EKG  . ED EKG  . EKG 12-Lead  . EKG  12-Lead  . EKG 12-Lead  . EKG 12-Lead    ASSESSMENT AND PLAN:   1. COPD exacerbation due to acute bronchitis continue her on steroids, nebulizing therapy patient on Zithromax, get sputum cultures if possible  #2. Acute CHF,  Lasix  echocardiogram  follow ins and outs, weight Follow-up with [facility] cardiology For now continue with Cardizem and beta blockers as per cardiology recommendation  #. Bronchitis, Zithromax, get sputum cultures if possible  #. CK D stage III, follow with diuresis,  #. A. fib, RVR, initiate patient on Cardizem intravenously as needed, continue metoprolol,   #. Elevated troponin, likely demand ischemia, follow cardiac enzymes 3, continue metoprolol, aspirin, nitroglycerin, Lovenox, get cardiologist involved, get echo  #. Malignant essential hypertension, likely stress related,  Metoprolol dose increased to 50 mg by mouth twice a day Amlodipine dose (increased to 10 mg  Discontinue Cardizem as the patient is already on amlodipine  discontinue topical nitroglycerin paste nitroglycerin topically and titrate as needed      All the records are reviewed and case discussed with Care Management/Social Workerr. Management plans discussed with the patient, family and they are in agreement.  CODE STATUS: FC   TOTAL TIME TAKING CARE OF THIS PATIENT: 36 minutes.   POSSIBLE D/C IN 1-2  DAYS, DEPENDING ON CLINICAL CONDITION.  Note: This dictation was prepared with Dragon dictation along with smaller phrase technology. Any transcriptional errors that result from this process are unintentional.   Nicholes Mango M.D on 06/15/2016 at 4:59 PM  Between 7am to 6pm - Pager - [phone] After 6pm go to [email or web address] - password [email or web address] [facility]  Office  [phone]  CC: Primary care physician; Einar Pheasant, MD

## 2016-06-15 NOTE — Progress Notes (Signed)
*  PRELIMINARY RESULTS* Echocardiogram 2D Echocardiogram has been performed.  Sherrie Sport 06/15/2016, 11:06 AM

## 2016-06-15 NOTE — Progress Notes (Signed)
Pt requesting Oxygen for comfort, O2 sat 93% on ra, 2LO2 per [place] applied to pt.

## 2016-06-15 NOTE — Progress Notes (Signed)
To CT via bed.

## 2016-06-16 LAB — HEMOGLOBIN A1C
Hgb A1c MFr Bld: 5.7 % — ABNORMAL HIGH (ref 4.8–5.6)
Mean Plasma Glucose: 117 mg/dL

## 2016-06-16 LAB — GLUCOSE, CAPILLARY: Glucose-Capillary: 201 mg/dL — ABNORMAL HIGH (ref 65–99)

## 2016-06-16 MED ORDER — ALBUTEROL SULFATE (2.5 MG/3ML) 0.083% IN NEBU
2.5000 mg | INHALATION_SOLUTION | RESPIRATORY_TRACT | Status: DC
  Administered 2016-06-16 – 2016-06-17 (×8): 2.5 mg via RESPIRATORY_TRACT
  Filled 2016-06-16 (×10): qty 3

## 2016-06-16 MED ORDER — METOPROLOL TARTRATE 50 MG PO TABS
100.0000 mg | ORAL_TABLET | Freq: Two times a day (BID) | ORAL | Status: DC
  Administered 2016-06-16 – 2016-06-17 (×2): 100 mg via ORAL
  Filled 2016-06-16 (×2): qty 2

## 2016-06-16 MED ORDER — METOPROLOL TARTRATE 5 MG/5ML IV SOLN: 5.0000 mg | INTRAVENOUS | Status: DC | PRN

## 2016-06-16 MED ORDER — METHYLPREDNISOLONE SODIUM SUCC 40 MG IJ SOLR
40.0000 mg | Freq: Two times a day (BID) | INTRAMUSCULAR | Status: DC
  Administered 2016-06-16 – 2016-06-17 (×2): 40 mg via INTRAVENOUS
  Filled 2016-06-16 (×2): qty 1

## 2016-06-16 NOTE — Care Management Note (Signed)
Case Management Note  Patient Details  Name: Tina [name] MRN: [id number] Date of Birth: Mar 19, 1935  Additional Comments:   Independent in all adls, denies issues accessing medical care, obtaining medications or with transportation.  Current with her PCP.  No discharge needs identified at present by care manager or members of care team. Patient herself declines need for any home health services or oxygen   Expected Discharge Date:                  Expected Discharge Plan:  Home/Self Care  In-House Referral:     Discharge planning Services  CM Consult  Post Acute Care Choice:  NA Choice offered to:  NA  DME Arranged:  N/A DME Agency:  NA  HH Arranged:  NA HH Agency:  NA  Status of Service:  Completed, signed off  If discussed at [place] of Stay Meetings, dates discussed:      Katrina Stack, RN 06/16/2016, 6:23 PM

## 2016-06-16 NOTE — Care Management Important Message (Signed)
Important Message  Patient Details  Name: Tina [name] MRN: [id number] Date of Birth: 11-14-1934   Medicare Important Message Given:  Yes  Initial signed IM printed from Epic and given to patient.     Katrina Stack, RN 06/16/2016, 6:21 PM

## 2016-06-16 NOTE — Progress Notes (Signed)
SATURATION QUALIFICATIONS: (This note is used to comply with regulatory documentation for home oxygen)  Patient Saturations on Room Air at Rest = 93%  Patient Saturations on Room Air while Ambulating = 90%  Patient Saturations on 2 Liters of oxygen while Ambulating = 96%  Please briefly explain why patient needs home oxygen: Does not qualify for home O2

## 2016-06-16 NOTE — Progress Notes (Signed)
[facility] Cardiology  SUBJECTIVE: Patient states she is feeling better today, breathing better, no chest pain or palpitations.    Vitals:   06/15/16 2020 06/16/16 0422 06/16/16 0835 06/16/16 0837  BP:  (!) 150/68 (!) 176/69 (!) 176/69  Pulse:  76  91  Resp:  18    Temp:  97.5 F (36.4 C)    TempSrc:  Oral    SpO2: 93% 92%  92%  Weight:  58.4 kg (128 lb 12.8 oz)    Height:         Intake/Output Summary (Last 24 hours) at 06/16/16 0901 Last data filed at 06/15/16 2310  Gross per 24 hour  Intake              600 ml  Output             1300 ml  Net             -700 ml      PHYSICAL EXAM  General: Well developed, well nourished, in no acute distress HEENT:  Normocephalic and atramatic Neck:  No JVD.  Lungs: Diffuse expiratory wheezing. No use of accessory muscles. No appreciable crackles. Heart: HRRR . Normal S1 and S2 without gallops or murmurs.  Abdomen: Bowel sounds are positive, abdomen soft and non-tender  Msk:  Patient reclining in bed. Extremities: No clubbing, cyanosis or edema.   Neuro: Alert and oriented X 3. Psych:  Good affect, responds appropriately   LABS: Basic Metabolic Panel:  Recent Labs  06/14/16 0958 06/15/16 0805  NA 135 138  K 3.7 3.4*  CL 100* 100*  CO2 24 29  GLUCOSE 189* 134*  BUN 45* 41*  CREATININE 1.27* 1.21*  CALCIUM 9.8 10.0   Liver Function Tests:  Recent Labs  06/14/16 0958  AST 33  ALT 26  ALKPHOS 54  BILITOT 0.6  PROT 7.2  ALBUMIN 3.9   No results for input(s): LIPASE, AMYLASE in the last 72 hours. CBC:  Recent Labs  06/14/16 0958 06/15/16 0805  WBC 19.3* 14.9*  NEUTROABS 18.4*  --   HGB 15.3 15.7  HCT 44.0 45.6  MCV 89.7 89.6  PLT 288 297   Cardiac Enzymes:  Recent Labs  06/14/16 2041 06/15/16 0246 06/15/16 0805  TROPONINI 0.03* 0.03* 0.03*   BNP: Invalid input(s): POCBNP D-Dimer: No results for input(s): DDIMER in the last 72 hours. Hemoglobin A1C:  Recent Labs  06/14/16 2041  HGBA1C 5.7*    Fasting Lipid Panel: No results for input(s): CHOL, HDL, LDLCALC, TRIG, CHOLHDL, LDLDIRECT in the last 72 hours. Thyroid Function Tests: No results for input(s): TSH, T4TOTAL, T3FREE, THYROIDAB in the last 72 hours.  Invalid input(s): FREET3 Anemia Panel: No results for input(s): VITAMINB12, FOLATE, FERRITIN, TIBC, IRON, RETICCTPCT in the last 72 hours.  Ct Angio Chest Pe W Or Wo Contrast  Result Date: 06/15/2016 CLINICAL DATA:  Chest pain, cough, exertional shortness of breath, lower extremity edema EXAM: CT ANGIOGRAPHY CHEST WITH CONTRAST TECHNIQUE: Multidetector CT imaging of the chest was performed using the standard protocol during bolus administration of intravenous contrast. Multiplanar CT image reconstructions and MIPs were obtained to evaluate the vascular anatomy. CONTRAST:  60 cc Isovue 370 COMPARISON:  Chest x-ray 06/14/2016 FINDINGS: Cardiovascular: The pulmonary arteries are well opacified. There is no evidence of acute pulmonary embolism. The thoracic aorta is not as well opacified but no acute abnormality is evident. There is moderate thoracic aortic atherosclerosis present. Diffuse coronary artery calcifications are noted. The heart is within  normal limits in size. No pericardial effusion is seen. The mid ascending thoracic aorta measures 33 mm in diameter. Mediastinum/Nodes: No mediastinal or hilar adenopathy is noted. The thyroid gland is unremarkable. No hiatal hernia is seen. Lungs/Pleura: There are mild changes of centrilobular emphysema present. On lung window images, no lung parenchymal abnormality is noted. There is no evidence of infiltrate and no pleural effusion is seen. There may be some thickening of the bronchial walls which could indicate bronchitis. The central airway is patent. Upper Abdomen: The large cyst emanates from the upper pole of the left kidney. Otherwise the upper abdomen is unremarkable on the images obtained. The few minimally prominent lymph nodes  present. Musculoskeletal: The thoracic vertebrae are in normal alignment. No acute thoracic compression deformity is seen. Review of the MIP images confirms the above findings. IMPRESSION: 1. Negative CT angiogram of the chest. No evidence of acute pulmonary embolism. 2. Moderate thoracic aortic atherosclerosis. 3. Mild centrilobular emphysema. 4. Somewhat thickened peribronchial walls could indicate bronchitis. Electronically Signed   By: Ivar Drape M.D.   On: 06/15/2016 13:19   Dg Chest Portable 1 View  Result Date: 06/14/2016 CLINICAL DATA:  Shortness of breath, dry cough. EXAM: PORTABLE CHEST 1 VIEW COMPARISON:  06/12/2016 and CT chest 12/02/2014. FINDINGS: Trachea is midline. Heart size stable. Thoracic aorta is calcified. Minimal biapical pleural thickening. Lungs appear mildly hyperinflated but clear. No pleural fluid. IMPRESSION: Hyperinflation without acute finding. Electronically Signed   By: Lorin Picket M.D.   On: 06/14/2016 09:54     Echo Normal left ventricular function, mild TR , mild MR   TELEMETRY: Sinus rhythm, rate 80  ASSESSMENT AND PLAN:  Active Problems:   COPD exacerbation (HCC)   Acute bronchitis   Elevated troponin   CKD (chronic kidney disease), stage III   Leukocytosis    1. Acute COPD exacerbation due to acute bronchitis, breathing improving, still wheezing. Echo normal, not suggestive of heart failure.  2. Sinus arrhthymia, no definitive evidence for atrial fibrillation.   Recommendations: 1. Agree with current therapy. 2. Defer full dose anticoagulation. 3. Continue metoprolol tartrate and amlodipine 4. Discontinue IV Lasix 6. Would benefit from continued nebulizer treatments.    Clabe Seal, PA-C 06/16/2016 9:01 AM

## 2016-06-16 NOTE — Progress Notes (Signed)
A&O. Independent. On 2L O2. Dyspneic with exertion. ON IV lasix, IV antibiotics and solu-medrol. SLept well through the night.

## 2016-06-16 NOTE — Progress Notes (Signed)
[facility] NAME: Tina [name]    MR#:  [id number]  DATE OF BIRTH:  July 02, 1934  SUBJECTIVE:  CHIEF COMPLAINT:  Shortness of breath is better. Still wheezing  REVIEW OF SYSTEMS:  CONSTITUTIONAL: No fever, fatigue or weakness.  EYES: No blurred or double vision.  EARS, NOSE, AND THROAT: No tinnitus or ear pain.  RESPIRATORY: Reporting some cough, shortness of breath, wheezing  CARDIOVASCULAR: No chest pain, orthopnea, edema.  GASTROINTESTINAL: No nausea, vomiting, diarrhea or abdominal pain.  GENITOURINARY: No dysuria, hematuria.  ENDOCRINE: No polyuria, nocturia,  HEMATOLOGY: No anemia, easy bruising or bleeding SKIN: No rash or lesion. MUSCULOSKELETAL: No joint pain or arthritis.   NEUROLOGIC: No tingling, numbness, weakness.  PSYCHIATRY: No anxiety or depression.   DRUG ALLERGIES:   Allergies  Allergen Reactions  . Evista [Raloxifene] Other (See Comments)    Night sweats Night sweats Reaction:  Night sweats   . Fluticasone-Salmeterol Other (See Comments)    Reaction:  Cough     VITALS:  Blood pressure (!) 176/69, pulse 91, temperature 97.5 F (36.4 C), temperature source Oral, resp. rate 18, height 5\' 5"  (1.651 m), weight 58.4 kg (128 lb 12.8 oz), SpO2 92 %.  PHYSICAL EXAMINATION:  GENERAL:  82 y.o.-year-old patient lying in the bed with no acute distress.  EYES: Pupils equal, round, reactive to light and accommodation. No scleral icterus. Extraocular muscles intact.  HEENT: Head atraumatic, normocephalic. Oropharynx and nasopharynx clear.  NECK:  Supple, no jugular venous distention. No thyroid enlargement, no tenderness.  LUNGS:  Moderately diminished breath soundS bilaterally,  Diffuse Wheezing, NO rales,rhonchi or crepitation. No use of accessory muscles of respiration.  CARDIOVASCULAR: S1, S2 normal. No murmurs, rubs, or gallops.  ABDOMEN: Soft, nontender, nondistended. Bowel sounds present. No  organomegaly or mass.  EXTREMITIES: No pedal edema, cyanosis, or clubbing.  NEUROLOGIC: Cranial nerves II through XII are intact. Muscle strength 5/5 in all extremities. Sensation intact. Gait not checked.  PSYCHIATRIC: The patient is alert and oriented x 3.  SKIN: No obvious rash, lesion, or ulcer.    LABORATORY PANEL:   CBC  Recent Labs Lab 06/15/16 0805  WBC 14.9*  HGB 15.7  HCT 45.6  PLT 297   ------------------------------------------------------------------------------------------------------------------  Chemistries   Recent Labs Lab 06/14/16 0958 06/15/16 0805  NA 135 138  K 3.7 3.4*  CL 100* 100*  CO2 24 29  GLUCOSE 189* 134*  BUN 45* 41*  CREATININE 1.27* 1.21*  CALCIUM 9.8 10.0  AST 33  --   ALT 26  --   ALKPHOS 54  --   BILITOT 0.6  --    ------------------------------------------------------------------------------------------------------------------  Cardiac Enzymes  Recent Labs Lab 06/15/16 0805  TROPONINI 0.03*   ------------------------------------------------------------------------------------------------------------------  RADIOLOGY:  Ct Angio Chest Pe W Or Wo Contrast  Result Date: 06/15/2016 CLINICAL DATA:  Chest pain, cough, exertional shortness of breath, lower extremity edema EXAM: CT ANGIOGRAPHY CHEST WITH CONTRAST TECHNIQUE: Multidetector CT imaging of the chest was performed using the standard protocol during bolus administration of intravenous contrast. Multiplanar CT image reconstructions and MIPs were obtained to evaluate the vascular anatomy. CONTRAST:  60 cc Isovue 370 COMPARISON:  Chest x-ray 06/14/2016 FINDINGS: Cardiovascular: The pulmonary arteries are well opacified. There is no evidence of acute pulmonary embolism. The thoracic aorta is not as well opacified but no acute abnormality is evident. There is moderate thoracic aortic atherosclerosis present. Diffuse coronary artery calcifications are noted. The heart is within  normal  limits in size. No pericardial effusion is seen. The mid ascending thoracic aorta measures 33 mm in diameter. Mediastinum/Nodes: No mediastinal or hilar adenopathy is noted. The thyroid gland is unremarkable. No hiatal hernia is seen. Lungs/Pleura: There are mild changes of centrilobular emphysema present. On lung window images, no lung parenchymal abnormality is noted. There is no evidence of infiltrate and no pleural effusion is seen. There may be some thickening of the bronchial walls which could indicate bronchitis. The central airway is patent. Upper Abdomen: The large cyst emanates from the upper pole of the left kidney. Otherwise the upper abdomen is unremarkable on the images obtained. The few minimally prominent lymph nodes present. Musculoskeletal: The thoracic vertebrae are in normal alignment. No acute thoracic compression deformity is seen. Review of the MIP images confirms the above findings. IMPRESSION: 1. Negative CT angiogram of the chest. No evidence of acute pulmonary embolism. 2. Moderate thoracic aortic atherosclerosis. 3. Mild centrilobular emphysema. 4. Somewhat thickened peribronchial walls could indicate bronchitis. Electronically Signed   By: Ivar Drape M.D.   On: 06/15/2016 13:19    EKG:   Orders placed or performed during the hospital encounter of 06/14/16  . ED EKG  . ED EKG  . EKG 12-Lead  . EKG 12-Lead  . ED EKG  . ED EKG  . EKG 12-Lead  . EKG 12-Lead  . EKG 12-Lead  . EKG 12-Lead    ASSESSMENT AND PLAN:   1. COPD exacerbation due to acute bronchitis continue her on steroids, nebulizing therapy patient on Zithromax, get sputum cultures if possible Clinically improving and will discharge in a.m.   #2. Acute CHF??  Lasix discontinued by cardiology not sure if the patient haS CHF  echocardiogram normal   follow ins and outs, weight Follow-up with [facility] cardiology For now continue with Cardizem and beta blockers as per cardiology recommendation  defer  anticoagulation. For now  #. Bronchitis, Zithromax, get sputum cultures if possible  #. CK D stage III, follow with diuresis,  #. A. fib, RVR, initiate patient on Cardizem intravenously as needed, continue metoprolol,   #. Elevated troponin, likely demand ischemia, follow cardiac enzymes 3, continue metoprolol, aspirin, nitroglycerin, Lovenox, get cardiologist involved, get echo  #. 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 Management plans discussed with the patient, family and they are in agreement.  CODE STATUS: FC   TOTAL TIME TAKING CARE OF THIS PATIENT: 36 minutes.   POSSIBLE D/C IN 1  DAYS, DEPENDING ON CLINICAL CONDITION.  Note: This dictation was prepared with Dragon dictation along with smaller phrase technology. Any transcriptional errors that result from this process are unintentional.   Nicholes Mango M.D on 06/16/2016 at 5:21 PM  Between 7am to 6pm - Pager - [phone] After 6pm go to [email or web address] - password [email or web address] [facility]  Office  [phone]  CC: Primary care physician; Einar [place]asant, MD

## 2016-06-17 LAB — GLUCOSE, CAPILLARY: Glucose-Capillary: 178 mg/dL — ABNORMAL HIGH (ref 65–99)

## 2016-06-17 MED ORDER — PREDNISONE 10 MG (21) PO TBPK: ORAL_TABLET | ORAL | 0 refills | Status: DC

## 2016-06-17 MED ORDER — AZITHROMYCIN 250 MG PO TABS: 250.0000 mg | ORAL_TABLET | Freq: Every day | ORAL | 0 refills | Status: AC

## 2016-06-17 NOTE — Progress Notes (Signed)
Pt alert and oriented x4, no complaints of pain or discomfort.  Bed in low position, call bell within reach.  Bed alarms on and functioning.  Assessment done and charted.  Will continue to monitor and do hourly rounding throughout the shift 

## 2016-06-17 NOTE — Progress Notes (Signed)
[facility] Card[facility]  SUBJECTIVE: Patient reports she is feeling well today, breathing better, she denies chest pain or palpitations.    Vitals:   06/16/16 2005 06/17/16 0324 06/17/16 0818 06/17/16 0939  BP: (!) 133/59 (!) 157/56 (!) 157/73   Pulse: 76 66 74 65  Resp: 18 18 18    Temp: 98.2 F (36.8 C) 98.5 F (36.9 C)    TempSrc: Oral Oral    SpO2: 93% 96% 93%   Weight:  128 lb 3.2 oz (58.2 kg)    Height:         Intake/Output Summary (Last 24 hours) at 06/17/16 1127 Last data filed at 06/17/16 0800  Gross per 24 hour  Intake              600 ml  Output             2800 ml  Net            -2200 ml      PHYSICAL EXAM  General: Well developed, well nourished, in no acute distress HEENT:  Normocephalic and atramatic Neck:  No JVD.  Lungs: Diffuse expiratory wheezing. No use of accessory muscles. No crackles noted. Heart: HRRR . Normal S1 and S2 without gallops or murmurs.  Abdomen: Bowel sounds are positive, abdomen soft and non-tender  Msk:  Patient sitting up in bed. Extremities: No clubbing, cyanosis or edema.   Neuro: Alert and oriented X 3. Psych:  Good affect, cooperative, responds appropriately   LABS: Basic Metabolic Panel:  Recent Labs  06/15/16 0805  NA 138  K 3.4*  CL 100*  CO2 29  GLUCOSE 134*  BUN 41*  CREATININE 1.21*  CALCIUM 10.0   Liver Function Tests: No results for input(s): AST, ALT, ALKPHOS, BILITOT, PROT, ALBUMIN in the last 72 hours. No results for input(s): LIPASE, AMYLASE in the last 72 hours. CBC:  Recent Labs  06/15/16 0805  WBC 14.9*  HGB 15.7  HCT 45.6  MCV 89.6  PLT 297   Cardiac Enzymes:  Recent Labs  06/14/16 2041 06/15/16 0246 06/15/16 0805  TROPONINI 0.03* 0.03* 0.03*   BNP: Invalid input(s): POCBNP D-Dimer: No results for input(s): DDIMER in the last 72 hours. Hemoglobin A1C:  Recent Labs  06/14/16 2041  HGBA1C 5.7*   Fasting Lipid Panel: No results for input(s): CHOL, HDL, LDLCALC, TRIG, CHOLHDL,  LDLDIRECT in the last 72 hours. Thyroid Function Tests: No results for input(s): TSH, T4TOTAL, T3FREE, THYROIDAB in the last 72 hours.  Invalid input(s): FREET3 Anemia Panel: No results for input(s): VITAMINB12, FOLATE, FERRITIN, TIBC, IRON, RETICCTPCT in the last 72 hours.  Ct Angio Chest Pe W Or Wo Contrast  Result Date: 06/15/2016 CLINICAL DATA:  Chest pain, cough, exertional shortness of breath, lower extremity edema EXAM: CT ANGIOGRAPHY CHEST WITH CONTRAST TECHNIQUE: Multidetector CT imaging of the chest was performed using the standard protocol during bolus administration of intravenous contrast. Multiplanar CT image reconstructions and MIPs were obtained to evaluate the vascular anatomy. CONTRAST:  60 cc Isovue 370 COMPARISON:  Chest x-ray 06/14/2016 FINDINGS: Cardiovascular: The pulmonary arteries are well opacified. There is no evidence of acute pulmonary embolism. The thoracic aorta is not as well opacified but no acute abnormality is evident. There is moderate thoracic aortic atherosclerosis present. Diffuse coronary artery calcifications are noted. The heart is within normal limits in size. No pericardial effusion is seen. The mid ascending thoracic aorta measures 33 mm in diameter. Mediastinum/Nodes: No mediastinal or hilar adenopathy is noted. The thyroid  gland is unremarkable. No hiatal hernia is seen. Lungs/Pleura: There are mild changes of centrilobular emphysema present. On lung window images, no lung parenchymal abnormality is noted. There is no evidence of infiltrate and no pleural effusion is seen. There may be some thickening of the bronchial walls which could indicate bronchitis. The central airway is patent. Upper Abdomen: The large cyst emanates from the upper pole of the left kidney. Otherwise the upper abdomen is unremarkable on the images obtained. The few minimally prominent lymph nodes present. Musculoskeletal: The thoracic vertebrae are in normal alignment. No acute thoracic  compression deformity is seen. Review of the MIP images confirms the above findings. IMPRESSION: 1. Negative CT angiogram of the chest. No evidence of acute pulmonary embolism. 2. Moderate thoracic aortic atherosclerosis. 3. Mild centrilobular emphysema. 4. Somewhat thickened peribronchial walls could indicate bronchitis. Electronically Signed   By: Ivar Drape M.D.   On: 06/15/2016 13:19     Echo Normal left ventricular function, mild TR , mild MR   TELEMETRY: Sinus rhythm, rate 80  ASSESSMENT AND PLAN:  Active Problems:   COPD exacerbation (HCC)   Acute bronchitis   Elevated troponin   CKD (chronic kidney disease), stage III   Leukocytosis    1. Acute COPD exacerbation due to acute bronchitis, much improved, feels nearly back to baseline. Echo normal, not suggestive of worsening heart failure. 2. Sinus arrhthymia, no definitive evidence for atrial fibrillation 3. Known CAD, borderline elevated troponin, more likely due to demand supply ischemia  Recommendations: 1. Agree with current therapy. 2. Defer full dose anticoagulation. 3. Continue metoprolol tartrate and amlodipine 4. Follow-up with Cardiology as outpatient.  The patient was seen under the supervision of Dr. Saralyn Pilar.   Paige Horcher, PA-C 06/17/2016 11:27 AM

## 2016-06-17 NOTE — Discharge Summary (Signed)
[facility] [place] NAME: Tina [name]    MR#:  [id number]  DATE OF BIRTH:  February 21, 1935  DATE OF ADMISSION:  06/14/2016 ADMITTING PHYSICIAN: Theodoro Grist, MD  DATE OF DISCHARGE: 06/17/2016  PRIMARY CARE PHYSICIAN: Einar Pheasant, MD    ADMISSION DIAGNOSIS:  Elevated troponin [R74.8] COPD exacerbation (HCC) [J44.1] Dyspnea, unspecified type [R06.00]  DISCHARGE DIAGNOSIS:  Active Problems:   COPD exacerbation (HCC)   Acute bronchitis   Elevated troponin   CKD (chronic kidney disease), stage III   Leukocytosis   SECONDARY DIAGNOSIS:   Past Medical History:  Diagnosis Date  . Anemia   . Asthma   . BRCA positive   . CAD (coronary artery disease)   . COPD (chronic obstructive pulmonary disease) ([place])   . Emphysema lung ([place])   . GERD (gastroesophageal reflux disease)   . History of colon polyps   . Hypercholesterolemia   . Hyperglycemia   . Hypertension   . Lung nodules   . Osteoporosis   . Personal history of tobacco use, presenting hazards to health 11/25/2014  . Polycythemia vera(238.4)   . Renal cyst   . Skin cancer     HOSPITAL COURSE:   1. COPD exacerbation due to acute bronchitis continue her on steroids, nebulizing therapy patient on Zithromax, get sputum cultures if possible Clinically improving   #2. Acute CHF- suspected but ruled out.  Lasix discontinued by cardiology not sure if the patient haS CHF  echocardiogram normal   follow ins and outs, weight Follow-up with [facility] cardiology For now continue with Cardizem and beta blockers as per cardiology recommendation  defer anticoagulation. For now  #. Bronchitis, Zithromax, get sputum cultures if possible  #. CK D stage III, follow with diuresis,  #. A. fib, RVR, initiate patient on Cardizem intravenously as needed, continue metoprolol,   #. Elevated troponin, likely demand ischemia, follow cardiac enzymes 3, continue metoprolol, aspirin,  nitroglycerin, Lovenox, getcardiologist involved, get echo  #. Malignant essential hypertension, likely stress related,  Metoprolol dose increased to 100  mg by mouth twice a day Amlodipine dose (increased to 10 mg  Discontinue Cardizem as the patient is already on amlodipine  discontinue topical nitroglycerin paste nitroglycerin topically and titrate as needed     DISCHARGE CONDITIONS:   Stable.  CONSULTS OBTAINED:  Treatment Team:  Isaias Cowman, MD  DRUG ALLERGIES:   Allergies  Allergen Reactions  . Evista [Raloxifene] Other (See Comments)    Night sweats Night sweats Reaction:  Night sweats   . Fluticasone-Salmeterol Other (See Comments)    Reaction:  Cough     DISCHARGE MEDICATIONS:   Current Discharge Medication List    START taking these medications   Details  azithromycin (ZITHROMAX) 250 MG tablet Take 1 tablet (250 mg total) by mouth daily. Qty: 3 each, Refills: 0    predniSONE (STERAPRED UNI-PAK 21 TAB) 10 MG (21) TBPK tablet Take 6 tabs first day, 5 tab on day 2, then 4 on day 3rd, 3 tabs on day 4th , 2 tab on day 5th, and 1 tab on 6th day. Qty: 21 tablet, Refills: 0      CONTINUE these medications which have NOT CHANGED   Details  amLODipine (NORVASC) 5 MG tablet TAKE ONE TABLET BY MOUTH EVERY DAY Qty: 30 tablet, Refills: 0    aspirin EC 81 MG tablet Take 81 mg by mouth daily.    Calcium Carbonate-Vitamin D (CALCIUM-D) 600-400 MG-UNIT TABS Take 1  tablet by mouth daily.    cholecalciferol (VITAMIN D) 1000 units tablet Take 1,000 Units by mouth daily.    fluticasone (FLONASE) 50 MCG/ACT nasal spray Place 2 sprays into the nose daily. Qty: 16 g, Refills: 5    ipratropium-albuterol (DUONEB) 0.5-2.5 (3) MG/3ML SOLN Take 3 mLs by nebulization every 6 (six) hours as needed (for shortness of breath). Qty: 36 mL, Refills: 0    losartan (COZAAR) 100 MG tablet TAKE ONE TABLET BY MOUTH EVERY DAY Qty: 30 tablet, Refills: 3    lovastatin  (MEVACOR) 40 MG tablet Take 1 tablet by mouth  daily Qty: 90 tablet, Refills: 3    metoprolol tartrate (LOPRESSOR) 25 MG tablet Take one-half tablet by  mouth two times daily Qty: 90 tablet, Refills: 1    SYMBICORT 160-4.5 MCG/ACT inhaler Use 2 puffs two times daily Qty: 30.6 g, Refills: 3    triamterene-hydrochlorothiazide (MAXZIDE-25) 37.5-25 MG tablet TAKE 1 TABLET BY MOUTH  DAILY Qty: 90 tablet, Refills: 1      STOP taking these medications     predniSONE (DELTASONE) 50 MG tablet          DISCHARGE INSTRUCTIONS:    Follow with PMD in 2 weeks.  If you experience worsening of your admission symptoms, develop shortness of breath, life threatening emergency, suicidal or homicidal thoughts you must seek medical attention immediately by calling 911 or calling your MD immediately  if symptoms less severe.  You Must read complete instructions/literature along with all the possible adverse reactions/side effects for all the Medicines you take and that have been prescribed to you. Take any new Medicines after you have completely understood and accept all the possible adverse reactions/side effects.   Please note  You were cared for by a hospitalist during your hospital stay. If you have any questions about your discharge medications or the care you received while you were in the hospital after you are discharged, you can call the unit and asked to speak with the hospitalist on call if the hospitalist that took care of you is not available. Once you are discharged, your primary care physician will handle any further medical issues. Please note that NO REFILLS for any discharge medications will be authorized once you are discharged, as it is imperative that you return to your primary care physician (or establish a relationship with a primary care physician if you do not have one) for your aftercare needs so that they can reassess your need for medications and monitor your lab  values.    Today   CHIEF COMPLAINT:   Chief Complaint  Patient presents with  . Shortness of Breath    HISTORY OF PRESENT ILLNESS:  Tina [name]  is a 82 y.o. female with a known history of Asthma, coronary artery disease, emphysema, hypertension, hyperlipidemia, osteoporosis, former tobacco abuse, who quit about 15 years ago, who presents to the hospital with complaints of worsening shortness of breath, and oximeter swelling. According to the patient, she started noticing lower extremity swelling about 2 weeks ago, she gained approximately 5 pounds, she started having shortness of breath associated with this regimen presented to emergency room, was given steroids as well as inhalation therapy, has been using these, however, is not improving, she has progressive shortness of breath and some chest tightness, especially whenever she coughs. She came into the hospital again due to orthopnea, worsening shortness of breath and leg swelling. Chest x-ray was unremarkable. BNP was elevated as well as  troponin, hospitalist services  were contacted for admission. She endorses orthopnea.    VITAL SIGNS:  Blood pressure (!) 157/73, pulse 65, temperature 98.5 F (36.9 C), temperature source Oral, resp. rate 18, height '5\' 5"'  (1.651 m), weight 58.2 kg (128 lb 3.2 oz), SpO2 93 %.  I/O:   Intake/Output Summary (Last 24 hours) at 06/17/16 1131 Last data filed at 06/17/16 0800  Gross per 24 hour  Intake              600 ml  Output             2800 ml  Net            -2200 ml    PHYSICAL EXAMINATION:   GENERAL:  82 y.o.-year-old patient lying in the bed with no acute distress.  EYES: Pupils equal, round, reactive to light and accommodation. No scleral icterus. Extraocular muscles intact.  HEENT: Head atraumatic, normocephalic. Oropharynx and nasopharynx clear.  NECK:  Supple, no jugular venous distention. No thyroid enlargement, no tenderness.  LUNGS:  Moderately diminished breath soundS  bilaterally,  Diffuse Wheezing, NO rales,rhonchi or crepitation. No use of accessory muscles of respiration.  CARDIOVASCULAR: S1, S2 normal. No murmurs, rubs, or gallops.  ABDOMEN: Soft, nontender, nondistended. Bowel sounds present. No organomegaly or mass.  EXTREMITIES: No pedal edema, cyanosis, or clubbing.  NEUROLOGIC: Cranial nerves II through XII are intact. Muscle strength 5/5 in all extremities. Sensation intact. Gait not checked.  PSYCHIATRIC: The patient is alert and oriented x 3.  SKIN: No obvious rash, lesion, or ulcer.    DATA REVIEW:   CBC  Recent Labs Lab 06/15/16 0805  WBC 14.9*  HGB 15.7  HCT 45.6  PLT 297    Chemistries   Recent Labs Lab 06/14/16 0958 06/15/16 0805  NA 135 138  K 3.7 3.4*  CL 100* 100*  CO2 24 29  GLUCOSE 189* 134*  BUN 45* 41*  CREATININE 1.27* 1.21*  CALCIUM 9.8 10.0  AST 33  --   ALT 26  --   ALKPHOS 54  --   BILITOT 0.6  --     Cardiac Enzymes  Recent Labs Lab 06/15/16 0805  TROPONINI 0.03*    Microbiology Results  Results for orders placed or performed during the hospital encounter of 06/14/16  Culture, sputum-assessment     Status: None (Preliminary result)   Collection Time: 06/15/16  9:45 AM  Result Value Ref Range Status   Specimen Description EXPECTORATED SPUTUM  Final   Special Requests NONE  Final   Sputum evaluation   Final    Sputum specimen not acceptable for testing.  Please recollect.   REQUEST FOR RECOLLECT CALLED TO TANYA BERRY AT 1610 ON 06/15/16 [place].    Report Status PENDING  Incomplete    RADIOLOGY:  Ct Angio Chest Pe W Or Wo Contrast  Result Date: 06/15/2016 CLINICAL DATA:  Chest pain, cough, exertional shortness of breath, lower extremity edema EXAM: CT ANGIOGRAPHY CHEST WITH CONTRAST TECHNIQUE: Multidetector CT imaging of the chest was performed using the standard protocol during bolus administration of intravenous contrast. Multiplanar CT image reconstructions and MIPs were obtained to  evaluate the vascular anatomy. CONTRAST:  60 cc Isovue 370 COMPARISON:  Chest x-ray 06/14/2016 FINDINGS: Cardiovascular: The pulmonary arteries are well opacified. There is no evidence of acute pulmonary embolism. The thoracic aorta is not as well opacified but no acute abnormality is evident. There is moderate thoracic aortic atherosclerosis present. Diffuse coronary artery calcifications are noted. The heart is within  normal limits in size. No pericardial effusion is seen. The mid ascending thoracic aorta measures 33 mm in diameter. Mediastinum/Nodes: No mediastinal or hilar adenopathy is noted. The thyroid gland is unremarkable. No hiatal hernia is seen. Lungs/Pleura: There are mild changes of centrilobular emphysema present. On lung window images, no lung parenchymal abnormality is noted. There is no evidence of infiltrate and no pleural effusion is seen. There may be some thickening of the bronchial walls which could indicate bronchitis. The central airway is patent. Upper Abdomen: The large cyst emanates from the upper pole of the left kidney. Otherwise the upper abdomen is unremarkable on the images obtained. The few minimally prominent lymph nodes present. Musculoskeletal: The thoracic vertebrae are in normal alignment. No acute thoracic compression deformity is seen. Review of the MIP images confirms the above findings. IMPRESSION: 1. Negative CT angiogram of the chest. No evidence of acute pulmonary embolism. 2. Moderate thoracic aortic atherosclerosis. 3. Mild centrilobular emphysema. 4. Somewhat thickened peribronchial walls could indicate bronchitis. Electronically Signed   By: Ivar Drape M.D.   On: 06/15/2016 13:19    EKG:   Orders placed or performed during the hospital encounter of 06/14/16  . ED EKG  . ED EKG  . EKG 12-Lead  . EKG 12-Lead  . ED EKG  . ED EKG  . EKG 12-Lead  . EKG 12-Lead  . EKG 12-Lead  . EKG 12-Lead      Management plans discussed with the patient, family and  they are in agreement.  CODE STATUS:     Code Status Orders        Start     Ordered   06/14/16 2028  Full code  Continuous     06/14/16 2027    Code Status History    Date Active Date Inactive Code Status Order ID Comments User Context   11/14/2014  8:57 AM 11/15/2014  3:57 PM Full Code [id number]  Aldean Jewett, MD Inpatient      TOTAL TIME TAKING CARE OF THIS PATIENT: 35 minutes.    Vaughan Basta M.D on 06/17/2016 at 11:31 AM  Between 7am to 6pm - Pager - [phone]  After 6pm go to [email or web address] - password [email or web address] [facility]  Office  [phone]  CC: Primary care physician; Einar Pheasant, MD   Note: This dictation was prepared with Dragon dictation along with smaller phrase technology. Any transcriptional errors that result from this process are unintentional.

## 2016-06-17 NOTE — Progress Notes (Signed)
Pain noted   0/10.  Pt sitting up in the chair, call bell in reach.  Eating breakfast and tolerating well.  Will continue to monitor the pt throughout the shift./

## 2016-06-17 NOTE — Progress Notes (Signed)
Discharge: Pt d/c from room via wheelchair, Family member with the pt. Discharge instructions given to the patient and family members.  No questions from pt, reintegrated to the pt to call or go to the ED for chest discomfort or as needed. Pt dressed in street clothes and left with discharge papers and prescriptions in hand. IV d/ced, tele removed and no complaints of pain or discomfort.

## 2016-06-19 ENCOUNTER — Telehealth: Payer: Self-pay | Admitting: Internal Medicine

## 2016-06-19 NOTE — Telephone Encounter (Signed)
Pt called and stated that she was in the ED for COPD exacerbation. She needs a HFU with in the next couple of weeks. Please advise, thank you!  Call pt @ [phone]

## 2016-06-22 LAB — EXPECTORATED SPUTUM ASSESSMENT W REFEX TO RESP CULTURE

## 2016-06-22 LAB — EXPECTORATED SPUTUM ASSESSMENT W GRAM STAIN, RFLX TO RESP C

## 2016-07-07 ENCOUNTER — Other Ambulatory Visit: Payer: Self-pay | Admitting: Internal Medicine

## 2016-07-07 NOTE — Telephone Encounter (Signed)
Patient stated she does not reallyneed to see PCP she is feeling much better.

## 2016-07-15 ENCOUNTER — Other Ambulatory Visit: Payer: Self-pay | Admitting: Internal Medicine

## 2016-07-20 DIAGNOSIS — J449 Chronic obstructive pulmonary disease, unspecified: Secondary | ICD-10-CM | POA: Diagnosis not present

## 2016-07-31 ENCOUNTER — Inpatient Hospital Stay: Payer: Medicare Other | Attending: Oncology

## 2016-07-31 DIAGNOSIS — D751 Secondary polycythemia: Secondary | ICD-10-CM | POA: Insufficient documentation

## 2016-07-31 LAB — CBC WITH DIFFERENTIAL/PLATELET
BASOS ABS: 0 10*3/uL (ref 0–0.1)
Basophils Relative: 0 %
EOS PCT: 1 %
Eosinophils Absolute: 0.1 10*3/uL (ref 0–0.7)
HCT: 43.8 % (ref 35.0–47.0)
Hemoglobin: 15.4 g/dL (ref 12.0–16.0)
LYMPHS PCT: 20 %
Lymphs Abs: 2.2 10*3/uL (ref 1.0–3.6)
MCH: 31.6 pg (ref 26.0–34.0)
MCHC: 35.1 g/dL (ref 32.0–36.0)
MCV: 90 fL (ref 80.0–100.0)
MONO ABS: 0.8 10*3/uL (ref 0.2–0.9)
Monocytes Relative: 7 %
Neutro Abs: 7.6 10*3/uL — ABNORMAL HIGH (ref 1.4–6.5)
Neutrophils Relative %: 72 %
PLATELETS: 203 10*3/uL (ref 150–440)
RBC: 4.87 MIL/uL (ref 3.80–5.20)
RDW: 14.3 % (ref 11.5–14.5)
WBC: 10.7 10*3/uL (ref 3.6–11.0)

## 2016-08-21 ENCOUNTER — Encounter: Payer: Self-pay | Admitting: Emergency Medicine

## 2016-08-21 ENCOUNTER — Emergency Department: Payer: Medicare Other

## 2016-08-21 ENCOUNTER — Inpatient Hospital Stay
Admission: EM | Admit: 2016-08-21 | Discharge: 2016-08-23 | DRG: 190 | Disposition: A | Discharge status: Home / Self Care | Payer: Medicare Other | Attending: Internal Medicine | Admitting: Internal Medicine

## 2016-08-21 DIAGNOSIS — M810 Age-related osteoporosis without current pathological fracture: Secondary | ICD-10-CM | POA: Diagnosis present

## 2016-08-21 DIAGNOSIS — I2510 Atherosclerotic heart disease of native coronary artery without angina pectoris: Secondary | ICD-10-CM | POA: Diagnosis present

## 2016-08-21 DIAGNOSIS — I10 Essential (primary) hypertension: Secondary | ICD-10-CM | POA: Diagnosis present

## 2016-08-21 DIAGNOSIS — J9811 Atelectasis: Secondary | ICD-10-CM | POA: Diagnosis present

## 2016-08-21 DIAGNOSIS — Z832 Family history of diseases of the blood and blood-forming organs and certain disorders involving the immune mechanism: Secondary | ICD-10-CM

## 2016-08-21 DIAGNOSIS — J9601 Acute respiratory failure with hypoxia: Secondary | ICD-10-CM | POA: Diagnosis not present

## 2016-08-21 DIAGNOSIS — Z7982 Long term (current) use of aspirin: Secondary | ICD-10-CM

## 2016-08-21 DIAGNOSIS — Z8041 Family history of malignant neoplasm of ovary: Secondary | ICD-10-CM

## 2016-08-21 DIAGNOSIS — K219 Gastro-esophageal reflux disease without esophagitis: Secondary | ICD-10-CM | POA: Diagnosis present

## 2016-08-21 DIAGNOSIS — Z1501 Genetic susceptibility to malignant neoplasm of breast: Secondary | ICD-10-CM

## 2016-08-21 DIAGNOSIS — Z803 Family history of malignant neoplasm of breast: Secondary | ICD-10-CM | POA: Diagnosis not present

## 2016-08-21 DIAGNOSIS — Z8262 Family history of osteoporosis: Secondary | ICD-10-CM | POA: Diagnosis not present

## 2016-08-21 DIAGNOSIS — E785 Hyperlipidemia, unspecified: Secondary | ICD-10-CM | POA: Diagnosis present

## 2016-08-21 DIAGNOSIS — Z85828 Personal history of other malignant neoplasm of skin: Secondary | ICD-10-CM | POA: Diagnosis not present

## 2016-08-21 DIAGNOSIS — Z87891 Personal history of nicotine dependence: Secondary | ICD-10-CM

## 2016-08-21 DIAGNOSIS — E7800 Pure hypercholesterolemia, unspecified: Secondary | ICD-10-CM | POA: Diagnosis present

## 2016-08-21 DIAGNOSIS — J9621 Acute and chronic respiratory failure with hypoxia: Secondary | ICD-10-CM | POA: Diagnosis present

## 2016-08-21 DIAGNOSIS — Z86010 Personal history of colonic polyps: Secondary | ICD-10-CM

## 2016-08-21 DIAGNOSIS — Z79899 Other long term (current) drug therapy: Secondary | ICD-10-CM

## 2016-08-21 DIAGNOSIS — Z800 Family history of malignant neoplasm of digestive organs: Secondary | ICD-10-CM

## 2016-08-21 DIAGNOSIS — J441 Chronic obstructive pulmonary disease with (acute) exacerbation: Principal | ICD-10-CM | POA: Diagnosis present

## 2016-08-21 DIAGNOSIS — Z808 Family history of malignant neoplasm of other organs or systems: Secondary | ICD-10-CM

## 2016-08-21 DIAGNOSIS — Z7951 Long term (current) use of inhaled steroids: Secondary | ICD-10-CM

## 2016-08-21 DIAGNOSIS — Z825 Family history of asthma and other chronic lower respiratory diseases: Secondary | ICD-10-CM

## 2016-08-21 DIAGNOSIS — Z888 Allergy status to other drugs, medicaments and biological substances status: Secondary | ICD-10-CM | POA: Diagnosis not present

## 2016-08-21 DIAGNOSIS — D45 Polycythemia vera: Secondary | ICD-10-CM | POA: Diagnosis present

## 2016-08-21 DIAGNOSIS — R0602 Shortness of breath: Secondary | ICD-10-CM | POA: Diagnosis not present

## 2016-08-21 DIAGNOSIS — Z8249 Family history of ischemic heart disease and other diseases of the circulatory system: Secondary | ICD-10-CM | POA: Diagnosis not present

## 2016-08-21 DIAGNOSIS — R05 Cough: Secondary | ICD-10-CM | POA: Diagnosis not present

## 2016-08-21 LAB — CBC
HCT: 47.2 % — ABNORMAL HIGH (ref 35.0–47.0)
HEMOGLOBIN: 16 g/dL (ref 12.0–16.0)
MCH: 30.9 pg (ref 26.0–34.0)
MCHC: 34 g/dL (ref 32.0–36.0)
MCV: 91 fL (ref 80.0–100.0)
PLATELETS: 266 10*3/uL (ref 150–440)
RBC: 5.19 MIL/uL (ref 3.80–5.20)
RDW: 14.1 % (ref 11.5–14.5)
WBC: 7.7 10*3/uL (ref 3.6–11.0)

## 2016-08-21 LAB — BASIC METABOLIC PANEL
ANION GAP: 10 (ref 5–15)
BUN: 25 mg/dL — ABNORMAL HIGH (ref 6–20)
CHLORIDE: 101 mmol/L (ref 101–111)
CO2: 26 mmol/L (ref 22–32)
Calcium: 9.6 mg/dL (ref 8.9–10.3)
Creatinine, Ser: 1.16 mg/dL — ABNORMAL HIGH (ref 0.44–1.00)
GFR, EST AFRICAN AMERICAN: 51 mL/min — AB (ref >60)
GFR, EST NON AFRICAN AMERICAN: 44 mL/min — AB (ref >60)
Glucose, Bld: 174 mg/dL — ABNORMAL HIGH (ref 65–99)
POTASSIUM: 3.3 mmol/L — AB (ref 3.5–5.1)
SODIUM: 137 mmol/L (ref 135–145)

## 2016-08-21 LAB — MAGNESIUM: MAGNESIUM: 1.9 mg/dL (ref 1.7–2.4)

## 2016-08-21 LAB — TROPONIN I

## 2016-08-21 MED ORDER — FLUTICASONE PROPIONATE 50 MCG/ACT NA SUSP
2.0000 | Freq: Every day | NASAL | Status: DC
  Administered 2016-08-22 – 2016-08-23 (×2): 2 via NASAL
  Filled 2016-08-21: qty 16

## 2016-08-21 MED ORDER — METHYLPREDNISOLONE SODIUM SUCC 125 MG IJ SOLR
60.0000 mg | Freq: Once | INTRAMUSCULAR | Status: AC
  Administered 2016-08-21: 60 mg via INTRAVENOUS
  Filled 2016-08-21: qty 2

## 2016-08-21 MED ORDER — ACETAMINOPHEN 325 MG PO TABS: 650.0000 mg | ORAL_TABLET | Freq: Four times a day (QID) | ORAL | Status: DC | PRN

## 2016-08-21 MED ORDER — LOSARTAN POTASSIUM 50 MG PO TABS
100.0000 mg | ORAL_TABLET | Freq: Every day | ORAL | Status: DC
  Administered 2016-08-22 – 2016-08-23 (×2): 100 mg via ORAL
  Filled 2016-08-21 (×2): qty 2

## 2016-08-21 MED ORDER — IPRATROPIUM-ALBUTEROL 0.5-2.5 (3) MG/3ML IN SOLN
3.0000 mL | Freq: Four times a day (QID) | RESPIRATORY_TRACT | Status: DC
  Administered 2016-08-21 – 2016-08-23 (×7): 3 mL via RESPIRATORY_TRACT
  Filled 2016-08-21 (×7): qty 3

## 2016-08-21 MED ORDER — AMLODIPINE BESYLATE 5 MG PO TABS
5.0000 mg | ORAL_TABLET | Freq: Every day | ORAL | Status: DC
  Administered 2016-08-22 – 2016-08-23 (×2): 5 mg via ORAL
  Filled 2016-08-21 (×2): qty 1

## 2016-08-21 MED ORDER — PRAVASTATIN SODIUM 40 MG PO TABS
40.0000 mg | ORAL_TABLET | Freq: Every day | ORAL | Status: DC
  Administered 2016-08-22: 19:00:00 40 mg via ORAL
  Filled 2016-08-21: qty 1

## 2016-08-21 MED ORDER — ONDANSETRON HCL 4 MG/2ML IJ SOLN: 4.0000 mg | Freq: Four times a day (QID) | INTRAMUSCULAR | Status: DC | PRN

## 2016-08-21 MED ORDER — IPRATROPIUM-ALBUTEROL 0.5-2.5 (3) MG/3ML IN SOLN
3.0000 mL | Freq: Once | RESPIRATORY_TRACT | Status: AC
  Administered 2016-08-21: 3 mL via RESPIRATORY_TRACT
  Filled 2016-08-21: qty 3

## 2016-08-21 MED ORDER — IPRATROPIUM-ALBUTEROL 0.5-2.5 (3) MG/3ML IN SOLN
RESPIRATORY_TRACT | Status: AC
  Filled 2016-08-21: qty 3

## 2016-08-21 MED ORDER — METHYLPREDNISOLONE SODIUM SUCC 40 MG IJ SOLR
40.0000 mg | Freq: Four times a day (QID) | INTRAMUSCULAR | Status: DC
  Administered 2016-08-22 – 2016-08-23 (×7): 40 mg via INTRAVENOUS
  Filled 2016-08-21 (×7): qty 1

## 2016-08-21 MED ORDER — METOPROLOL TARTRATE 25 MG PO TABS
37.5000 mg | ORAL_TABLET | Freq: Two times a day (BID) | ORAL | Status: DC
  Administered 2016-08-22 – 2016-08-23 (×4): 37.5 mg via ORAL
  Filled 2016-08-21 (×4): qty 2

## 2016-08-21 MED ORDER — ASPIRIN EC 81 MG PO TBEC
81.0000 mg | DELAYED_RELEASE_TABLET | Freq: Every day | ORAL | Status: DC
  Administered 2016-08-22 – 2016-08-23 (×2): 81 mg via ORAL
  Filled 2016-08-21 (×2): qty 1

## 2016-08-21 MED ORDER — AZITHROMYCIN 500 MG PO TABS
250.0000 mg | ORAL_TABLET | Freq: Every day | ORAL | Status: DC
  Administered 2016-08-22 – 2016-08-23 (×2): 250 mg via ORAL
  Filled 2016-08-21 (×2): qty 1

## 2016-08-21 MED ORDER — ENOXAPARIN SODIUM 40 MG/0.4ML ~~LOC~~ SOLN
40.0000 mg | SUBCUTANEOUS | Status: DC
  Administered 2016-08-22 (×2): 40 mg via SUBCUTANEOUS
  Filled 2016-08-21 (×2): qty 0.4

## 2016-08-21 MED ORDER — ACETAMINOPHEN 650 MG RE SUPP: 650.0000 mg | Freq: Four times a day (QID) | RECTAL | Status: DC | PRN

## 2016-08-21 MED ORDER — VITAMIN D 1000 UNITS PO TABS
1000.0000 [IU] | ORAL_TABLET | Freq: Every day | ORAL | Status: DC
  Administered 2016-08-22 – 2016-08-23 (×2): 1000 [IU] via ORAL
  Filled 2016-08-21 (×2): qty 1

## 2016-08-21 MED ORDER — BUDESONIDE 0.5 MG/2ML IN SUSP
0.5000 mg | Freq: Two times a day (BID) | RESPIRATORY_TRACT | Status: DC
  Administered 2016-08-22 – 2016-08-23 (×3): 0.5 mg via RESPIRATORY_TRACT
  Filled 2016-08-21 (×3): qty 2

## 2016-08-21 MED ORDER — ALBUTEROL SULFATE (2.5 MG/3ML) 0.083% IN NEBU
INHALATION_SOLUTION | RESPIRATORY_TRACT | Status: AC
  Administered 2016-08-21: 5 mg via RESPIRATORY_TRACT
  Filled 2016-08-21: qty 3

## 2016-08-21 MED ORDER — TRIAMTERENE-HCTZ 37.5-25 MG PO TABS
0.5000 | ORAL_TABLET | Freq: Every day | ORAL | Status: DC
  Administered 2016-08-22 – 2016-08-23 (×2): 0.5 via ORAL
  Filled 2016-08-21: qty 0.5
  Filled 2016-08-21 (×2): qty 1

## 2016-08-21 MED ORDER — ONDANSETRON HCL 4 MG PO TABS: 4.0000 mg | ORAL_TABLET | Freq: Four times a day (QID) | ORAL | Status: DC | PRN

## 2016-08-21 MED ORDER — POTASSIUM CHLORIDE CRYS ER 20 MEQ PO TBCR
20.0000 meq | EXTENDED_RELEASE_TABLET | Freq: Once | ORAL | Status: AC
  Administered 2016-08-22: 20 meq via ORAL
  Filled 2016-08-21: qty 1

## 2016-08-21 MED ORDER — DOXYCYCLINE HYCLATE 100 MG PO TABS
100.0000 mg | ORAL_TABLET | Freq: Once | ORAL | Status: AC
  Administered 2016-08-21: 100 mg via ORAL
  Filled 2016-08-21: qty 1

## 2016-08-21 MED ORDER — ALBUTEROL SULFATE (2.5 MG/3ML) 0.083% IN NEBU
5.0000 mg | INHALATION_SOLUTION | Freq: Once | RESPIRATORY_TRACT | Status: AC
  Administered 2016-08-21: 5 mg via RESPIRATORY_TRACT
  Filled 2016-08-21: qty 3

## 2016-08-21 NOTE — H&P (Signed)
[facility] [place] NAME: Tina [name]    MR#:  [id number]  DATE OF BIRTH:  [birth year]  DATE OF ADMISSION:  08/21/2016  PRIMARY CARE PHYSICIAN: Einar Pheasant, MD   REQUESTING/REFERRING PHYSICIAN: Dr. Merlyn Lot  CHIEF COMPLAINT:   Chief Complaint  Patient presents with  . Shortness of Breath    HISTORY OF PRESENT ILLNESS:  Tina [name]  is a 82 y.o. female with a known history of COPD, hypertension, hyperlipidemia, osteoporosis, coronary artery disease who presents to the hospital due to shortness of breath. Patient says that she has had shortness of breath now progressively getting worse over the past week. She admits to a cough but nonproductive. She has shortness of breath even on minimal exertion and doing her ADLs. She presented to the ER and was noted to be in acute on chronic respiratory failure with hypoxia secondary to COPD exacerbation and hospitalist services were contacted further treatment and evaluation. Patient did use her nebulizers at home but did not alleviate her symptoms and therefore came to the ER for further evaluation. She denies any chest pain, fever, chills, nausea, vomiting, abdominal pain, dysuria and hematuria, melena hematochezia or any other associated symptoms presently.  PAST MEDICAL HISTORY:   Past Medical History:  Diagnosis Date  . Anemia   . Asthma   . BRCA positive   . CAD (coronary artery disease)   . COPD (chronic obstructive pulmonary disease) ([place])   . Emphysema lung ([place])   . GERD (gastroesophageal reflux disease)   . History of colon polyps   . Hypercholesterolemia   . Hyperglycemia   . Hypertension   . Lung nodules   . Osteoporosis   . Personal history of tobacco use, presenting hazards to health 11/25/2014  . Polycythemia vera(238.4)   . Renal cyst   . Skin cancer     PAST SURGICAL HISTORY:   Past Surgical History:  Procedure Laterality Date  . ANGIOPLASTY     coronary (x1)   . COLONOSCOPY WITH PROPOFOL N/A 03/02/2015   Procedure: COLONOSCOPY WITH PROPOFOL;  Surgeon: Lollie Sails, MD;  Location: [facility] END[facility]COPY;  Service: Endoscopy;  Laterality: N/A;  . CORONARY ANGIOPLASTY    . CORONARY ARTERY BYPASS GRAFT    . SALPINGOOPHORECTOMY    . TONSILECTOMY/ADENOIDECTOMY WITH MYRINGOTOMY    . TUBAL LIGATION      SOCIAL HISTORY:   Social History  Substance Use Topics  . Smoking status: Former Smoker    Packs/day: 1.00    Years: 45.00    Types: Cigarettes    Quit date: 11/11/2003  . Smokeless tobacco: Never Used  . Alcohol use No     Comment: occasional    FAMILY HISTORY:   Family History  Problem Relation Age of Onset  . Cirrhosis Father     died age 23  . Alcohol abuse Father   . Asthma Mother   . Congestive Heart Failure Mother   . Breast cancer Mother     2 (1/2 sisters)  . Osteoarthritis Mother   . Colon cancer Mother   . Lupus Sister   . Alcohol abuse Sister   . Ovarian cancer Sister   . Osteoporosis Sister   . Skin cancer Sister   . Breast cancer Cousin   . Breast cancer Maternal Aunt     DRUG ALLERGIES:   Allergies  Allergen Reactions  . Evista [Raloxifene] Other (See Comments)    Night sweats Night sweats Reaction:  Night  sweats   . Fluticasone-Salmeterol Other (See Comments)    Reaction:  Cough     REVIEW OF SYSTEMS:   Review of Systems  Constitutional: Negative for fever and weight loss.  HENT: Negative for congestion, nosebleeds and tinnitus.   Eyes: Negative for blurred vision, double vision and redness.  Respiratory: Positive for cough, shortness of breath and wheezing. Negative for hemoptysis.   Cardiovascular: Negative for chest pain, orthopnea, leg swelling and PND.  Gastrointestinal: Negative for abdominal pain, diarrhea, melena, nausea and vomiting.  Genitourinary: Negative for dysuria, hematuria and urgency.  Musculoskeletal: Negative for falls and joint pain.  Neurological: Negative for dizziness,  tingling, sensory change, focal weakness, seizures, weakness and headaches.  Endo/Heme/Allergies: Negative for polydipsia. Does not bruise/bleed easily.  Psychiatric/Behavioral: Negative for depression and memory loss. The patient is not nervous/anxious.     MEDICATIONS AT HOME:   Prior to Admission medications   Medication Sig Start Date End Date Taking? Authorizing Provider  amLODipine (NORVASC) 5 MG tablet TAKE ONE TABLET BY MOUTH EVERY DAY 07/07/16  Yes Dale Wells, MD  aspirin EC 81 MG tablet Take 81 mg by mouth daily.   Yes Historical Provider, MD  Calcium Carbonate-Vitamin D (CALCIUM-D) 600-400 MG-UNIT TABS Take 1 tablet by mouth daily.   Yes Historical Provider, MD  cholecalciferol (VITAMIN D) 1000 units tablet Take 1,000 Units by mouth daily.   Yes Historical Provider, MD  fluticasone (FLONASE) 50 MCG/ACT nasal spray Place 2 sprays into the nose daily. 10/10/12  Yes Dale Benton, MD  ipratropium-albuterol (DUONEB) 0.5-2.5 (3) MG/3ML SOLN Take 3 mLs by nebulization every 6 (six) hours as needed (for shortness of breath). 11/14/14  Yes Gayla Doss, MD  losartan (COZAAR) 100 MG tablet TAKE ONE TABLET BY MOUTH EVERY DAY 06/12/16  Yes Dale [place], MD  lovastatin (MEVACOR) 40 MG tablet Take 1 tablet by mouth  daily 12/27/15  Yes Dale [place], MD  metoprolol tartrate (LOPRESSOR) 25 MG tablet TAKE ONE-HALF TABLET BY  MOUTH TWO TIMES DAILY 07/17/16  Yes Dale Luana, MD  SYMBICORT 160-4.5 MCG/ACT inhaler Use 2 puffs two times daily 03/08/15  Yes Dale Colver, MD  triamterene-hydrochlorothiazide (MAXZIDE-25) 37.5-25 MG tablet TAKE 1 TABLET BY MOUTH  DAILY Patient taking differently: TAKE 1/2 TABLET BY MOUTH ONCE DAILY 06/06/16  Yes Dale [place], MD  predniSONE (STERAPRED UNI-PAK 21 TAB) 10 MG (21) TBPK tablet Take 6 tabs first day, 5 tab on day 2, then 4 on day 3rd, 3 tabs on day 4th , 2 tab on day 5th, and 1 tab on 6th day. Patient not taking: Reported on 08/21/2016 06/17/16   Altamese Dilling, MD      VITAL SIGNS:  Blood pressure (!) 164/57, pulse 94, temperature 98.2 F (36.8 C), temperature source Oral, resp. rate (!) 21, SpO2 95 %.  PHYSICAL EXAMINATION:  Physical Exam  GENERAL:  82 y.o.-year-old patient lying in the bed in mild Resp. distress EYES: Pupils equal, round, reactive to light and accommodation. No scleral icterus. Extraocular muscles intact.  HEENT: Head atraumatic, normocephalic. Oropharynx and nasopharynx clear. No oropharyngeal erythema, dry oral mucosa  NECK:  Supple, no jugular venous distention. No thyroid enlargement, no tenderness.  LUNGS: Lungs inspiratory and expiratory phase, diffuse end expiratory wheezing bilaterally, no rhonchi, rales. Positive use of accessory muscles. CARDIOVASCULAR: S1, S2 RRR. No murmurs, rubs, gallops, clicks.  ABDOMEN: Soft, nontender, nondistended. Bowel sounds present. No organomegaly or mass.  EXTREMITIES: No pedal edema, cyanosis, or clubbing. + 2 pedal & radial pulses  b/l.   NEUROLOGIC: Cranial nerves II through XII are intact. No focal Motor or sensory deficits appreciated b/l PSYCHIATRIC: The patient is alert and oriented x 3.  SKIN: No obvious rash, lesion, or ulcer.   LABORATORY PANEL:   CBC  Recent Labs Lab 08/21/16 1748  WBC 7.7  HGB 16.0  HCT 47.2*  PLT 266   ------------------------------------------------------------------------------------------------------------------  Chemistries   Recent Labs Lab 08/21/16 1748  NA 137  K 3.3*  CL 101  CO2 26  GLUCOSE 174*  BUN 25*  CREATININE 1.16*  CALCIUM 9.6  MG 1.9   ------------------------------------------------------------------------------------------------------------------  Cardiac Enzymes  Recent Labs Lab 08/21/16 1748  TROPONINI <0.03   ------------------------------------------------------------------------------------------------------------------  RADIOLOGY:  Dg Chest 2 View  Result Date: 08/21/2016 CLINICAL DATA:   Increasing shortness of breath with nonproductive cough EXAM: CHEST  2 VIEW COMPARISON:  06/14/2016 FINDINGS: Cardiac shadow is stable. Aortic calcifications are again seen. Hyperinflation of the lungs is again noted consistent with COPD. Mild increased basilar density is noted on the right consistent with atelectasis. No focal confluent infiltrate is noted. No acute bony abnormality is noted. IMPRESSION: Mild right basilar atelectasis. Electronically Signed   By: Inez Catalina M.D.   On: 08/21/2016 18:27     IMPRESSION AND PLAN:   82 year old female with past medical history of hypertension, COPD, history of polycythemia vera, osteoporosis, hyperlipidemia, coronary artery disease, who presents to the hospital due to shortness of breath.  1. Acute on chronic respiratory failure with hypoxia-secondary to COPD exacerbation. -Continue O2 supplementation, treat underlying COPD with IV steroids, scheduled DuoNeb's, Pulmicort nebs. -Assess the patient for home oxygen prior to discharge.  2. COPD exacerbation-secondary to bronchitis/exposure to pollen. -We'll treat the patient with IV steroids, scheduled DuoNeb's, Pulmicort nebs -Based on oral Zithromax, assessed for home oxygen prior to discharge.  3. Essential hypertension-continue amlodipine, metoprolol, losartan, triamterene/HCTZ.  4. Osteoporosis-continue calcium supplements and vitamin D.  5. Hyperlipidemia-continue lovastatin.    All the records are reviewed and case discussed with ED provider. Management plans discussed with the patient, family and they are in agreement.  CODE STATUS: Full code  TOTAL TIME TAKING CARE OF THIS PATIENT: 40 minutes.    Henreitta Leber M.D on 08/21/2016 at 8:18 PM  Between 7am to 6pm - Pager - [phone]  After 6pm go to [email or web address] - password [email or web address] [facility]  Office  [phone]  CC: Primary care physician; Einar Pheasant, MD

## 2016-08-21 NOTE — ED Notes (Signed)
Admitting at bedside 

## 2016-08-21 NOTE — Progress Notes (Signed)
Patient transferred from the ED to room 123 via stretcher. Oriented patient to the room and room equipment. Currently patient has no pain or discomfort. Will continue to monitor patient to end of shift.

## 2016-08-21 NOTE — ED Notes (Signed)
Pt states SOB x 1 week. States it started as headache and shoulder and arm aches. Denies fever, N&V&D. Thinks she might have had a fever once but didn't take temp. Pt is alert, oriented. Hx COPD, does NOT wear oxygen. Has rescue inhalers and nebs at home which she has had to do more often this past week.

## 2016-08-21 NOTE — ED Triage Notes (Signed)
Pt in via POV with complaints of increasing shortness of breath with non productive cough x approximately one week.  Pt with hx of COPD.  Pt labored, accessory muscle use, 92% on room air.

## 2016-08-21 NOTE — ED Provider Notes (Signed)
[facility] Emergency Department Provider Note    First MD Initiated Contact with Patient 08/21/16 [name]     (approximate)  I have reviewed the triage vital signs and the nursing notes.   HISTORY  Chief Complaint Shortness of Breath    HPI Tina [name] is a 82 y.o. female with a history of asthma and COPD presents with 1 week of shortness of breath. Patient states that she had diffuse aches starting on Monday with nonproductive cough and worsening shortness of breath. Denies any chest pain. No lower extremity swelling. Has been using her nebulizer treatments around-the-clock without any improvement. Thinks that she may have had chills and maybe a fever but did not check.  Does not smoke.  No home O2.   Past Medical History:  Diagnosis Date  . Anemia   . Asthma   . BRCA positive   . CAD (coronary artery disease)   . COPD (chronic obstructive pulmonary disease) ([place])   . Emphysema lung ([place])   . GERD (gastroesophageal reflux disease)   . History of colon polyps   . Hypercholesterolemia   . Hyperglycemia   . Hypertension   . Lung nodules   . Osteoporosis   . Personal history of tobacco use, presenting hazards to health 11/25/2014  . Polycythemia vera(238.4)   . Renal cyst   . Skin cancer    Family History  Problem Relation Age of Onset  . Cirrhosis Father     died age 40  . Alcohol abuse Father   . Asthma Mother   . Congestive Heart Failure Mother   . Breast cancer Mother     2 (1/2 sisters)  . Osteoarthritis Mother   . Colon cancer Mother   . Lupus Sister   . Alcohol abuse Sister   . Ovarian cancer Sister   . Osteoporosis Sister   . Skin cancer Sister   . Breast cancer Cousin   . Breast cancer Maternal Aunt    Past Surgical History:  Procedure Laterality Date  . ANGIOPLASTY     coronary (x1)  . COLONOSCOPY WITH PROPOFOL N/A 03/02/2015   Procedure: COLONOSCOPY WITH PROPOFOL;  Surgeon: Lollie Sails, MD;  Location: [facility]  ENDOSCOPY;  Service: Endoscopy;  Laterality: N/A;  . CORONARY ANGIOPLASTY    . CORONARY ARTERY BYPASS GRAFT    . SALPINGOOPHORECTOMY    . TONSILECTOMY/ADENOIDECTOMY WITH MYRINGOTOMY    . TUBAL LIGATION     Patient Active Problem List   Diagnosis Date Noted  . Acute bronchitis 06/14/2016  . Elevated troponin 06/14/2016  . CKD (chronic kidney disease), stage III 06/14/2016  . Leukocytosis 06/14/2016  . Abdominal bruit 04/16/2016  . Abnormal mammogram 06/06/2015  . Essential hypertension 02/16/2015  . Numbness 01/24/2015  . Personal history of tobacco use, presenting hazards to health 11/25/2014  . BRCA positive   . COPD exacerbation ([place]) 11/14/2014  . Sensation of pressure in bladder area 08/15/2014  . Health care maintenance 08/15/2014  . Awareness of heartbeats 09/30/2013  . Presence of coronary angioplasty implant and graft 09/30/2013  . Acute confusional state 08/10/2013  . History of colonic polyps 08/25/2012  . Female genuine stress incontinence 06/28/2012  . Infection of urinary tract 06/28/2012  . CAD (coronary artery disease) 05/23/2012  . COPD (chronic obstructive pulmonary disease) ([place]) 05/23/2012  . Hypertension 05/23/2012  . Hypercholesterolemia 05/23/2012  . Hyperglycemia 05/23/2012  . Polycythemia, secondary 05/23/2012  . Renal cyst 05/23/2012  . Osteoporosis 05/23/2012  Prior to Admission medications   Medication Sig Start Date End Date Taking? Authorizing Provider  amLODipine (NORVASC) 5 MG tablet TAKE ONE TABLET BY MOUTH EVERY DAY 07/07/16  Yes Einar Pheasant, MD  aspirin EC 81 MG tablet Take 81 mg by mouth daily.   Yes Historical Provider, MD  Calcium Carbonate-Vitamin D (CALCIUM-D) 600-400 MG-UNIT TABS Take 1 tablet by mouth daily.   Yes Historical Provider, MD  cholecalciferol (VITAMIN D) 1000 units tablet Take 1,000 Units by mouth daily.   Yes Historical Provider, MD  fluticasone (FLONASE) 50 MCG/ACT nasal spray Place 2 sprays into the nose  daily. 10/10/12  Yes Einar Pheasant, MD  ipratropium-albuterol (DUONEB) 0.5-2.5 (3) MG/3ML SOLN Take 3 mLs by nebulization every 6 (six) hours as needed (for shortness of breath). 11/14/14  Yes Joanne Gavel, MD  losartan (COZAAR) 100 MG tablet TAKE ONE TABLET BY MOUTH EVERY DAY 06/12/16  Yes Einar Pheasant, MD  lovastatin (MEVACOR) 40 MG tablet Take 1 tablet by mouth  daily 12/27/15  Yes Einar Pheasant, MD  metoprolol tartrate (LOPRESSOR) 25 MG tablet TAKE ONE-HALF TABLET BY  MOUTH TWO TIMES DAILY 07/17/16  Yes Einar Pheasant, MD  SYMBICORT 160-4.5 MCG/ACT inhaler Use 2 puffs two times daily 03/08/15  Yes Einar Pheasant, MD  triamterene-hydrochlorothiazide (MAXZIDE-25) 37.5-25 MG tablet TAKE 1 TABLET BY MOUTH  DAILY Patient taking differently: TAKE 1/2 TABLET BY MOUTH ONCE DAILY 06/06/16  Yes Einar Pheasant, MD  predniSONE (STERAPRED UNI-PAK 21 TAB) 10 MG (21) TBPK tablet Take 6 tabs first day, 5 tab on day 2, then 4 on day 3rd, 3 tabs on day 4th , 2 tab on day 5th, and 1 tab on 6th day. Patient not taking: Reported on 08/21/2016 06/17/16   Vaughan Basta, MD    Allergies Evista [raloxifene] and Fluticasone-salmeterol    Social History Social History  Substance Use Topics  . Smoking status: Former Smoker    Packs/day: 1.00    Years: 45.00    Types: Cigarettes    Quit date: 11/11/2003  . Smokeless tobacco: Never Used  . Alcohol use No     Comment: occasional    Review of Systems Patient denies headaches, rhinorrhea, blurry vision, numbness, shortness of breath, chest pain, edema, cough, abdominal pain, nausea, vomiting, diarrhea, dysuria, fevers, rashes or hallucinations unless otherwise stated above in HPI. ____________________________________________   PHYSICAL EXAM:  VITAL SIGNS: Vitals:   08/21/16 2130 08/21/16 2204  BP: (!) 156/72 (!) 184/75  Pulse: 96 93  Resp: (!) 21 19  Temp:  98.1 F (36.7 C)    Constitutional: Alert and oriented. Ill appearing in mod respiratory  distress Eyes: Conjunctivae are normal. PERRL. EOMI. Head: Atraumatic. Nose: No congestion/rhinnorhea. Mouth/Throat: Mucous membranes are moist.  Oropharynx non-erythematous. Neck: No stridor. Painless ROM. No cervical spine tenderness to palpation Hematological/Lymphatic/Immunilogical: No cervical lymphadenopathy. Cardiovascular: mildly tachycardic rate, regular rhythm. Grossly normal heart sounds.  Good peripheral circulation. Respiratory: tachypneic with + use of accessory muscles.  Diffuse end expiratory wheezing with diminished breathsounds Gastrointestinal: Soft and nontender. No distention. No abdominal bruits. No CVA tenderness. Musculoskeletal: No lower extremity tenderness nor edema.  No joint effusions. Neurologic:  Normal speech and language. No gross focal neurologic deficits are appreciated. No gait instability. Skin:  Skin is warm, dry and intact. No rash noted. Psychiatric: Mood and affect are normal. Speech and behavior are normal.  ____________________________________________   LABS (all labs ordered are listed, but only abnormal results are displayed)  Results for orders placed or performed  during the hospital encounter of 08/21/16 (from the past 24 hour(s))  CBC     Status: Abnormal   Collection Time: 08/21/16  5:48 PM  Result Value Ref Range   WBC 7.7 3.6 - 11.0 K/uL   RBC 5.19 3.80 - 5.20 MIL/uL   Hemoglobin 16.0 12.0 - 16.0 g/dL   HCT 47.2 (H) 35.0 - 47.0 %   MCV 91.0 80.0 - 100.0 fL   MCH 30.9 26.0 - 34.0 pg   MCHC 34.0 32.0 - 36.0 g/dL   RDW 14.1 11.5 - 14.5 %   Platelets 266 150 - 440 K/uL  Basic metabolic panel     Status: Abnormal   Collection Time: 08/21/16  5:48 PM  Result Value Ref Range   Sodium 137 135 - 145 mmol/L   Potassium 3.3 (L) 3.5 - 5.1 mmol/L   Chloride 101 101 - 111 mmol/L   CO2 26 22 - 32 mmol/L   Glucose, Bld 174 (H) 65 - 99 mg/dL   BUN 25 (H) 6 - 20 mg/dL   Creatinine, Ser 1.16 (H) 0.44 - 1.00 mg/dL   Calcium 9.6 8.9 - 10.3  mg/dL   GFR calc non Af Amer 44 (L) >60 mL/min   GFR calc Af Amer 51 (L) >60 mL/min   Anion gap 10 5 - 15  Troponin I     Status: None   Collection Time: 08/21/16  5:48 PM  Result Value Ref Range   Troponin I <0.03 <0.03 ng/mL  Magnesium     Status: None   Collection Time: 08/21/16  5:48 PM  Result Value Ref Range   Magnesium 1.9 1.7 - 2.4 mg/dL   ____________________________________________  EKG My review and personal interpretation at Time: 17:50   Indication: sob  Rate: 105  Rhythm: sinus Axis: normal Other: no st elevations or depressions, normal intervals ____________________________________________  RADIOLOGY  I personally reviewed all radiographic images ordered to evaluate for the above acute complaints and reviewed radiology reports and findings.  These findings were personally discussed with the patient.  Please see medical record for radiology report.  ____________________________________________   PROCEDURES  Procedure(s) performed:  Procedures    Critical Care performed: yes CRITICAL CARE Performed by: Merlyn Lot   Total critical care time: 45 minutes  Critical care time was exclusive of separately billable procedures and treating other patients.  Critical care was necessary to treat or prevent imminent or life-threatening deterioration.  Critical care was time spent personally by me on the following activities: development of treatment plan with patient and/or surrogate as well as nursing, discussions with consultants, evaluation of patient's response to treatment, examination of patient, obtaining history from patient or surrogate, ordering and performing treatments and interventions, ordering and review of laboratory studies, ordering and review of radiographic studies, pulse oximetry and re-evaluation of patient's condition.  ____________________________________________   INITIAL IMPRESSION / ASSESSMENT AND PLAN / ED COURSE  Pertinent labs &  imaging results that were available during my care of the patient were reviewed by me and considered in my medical decision making (see chart for details).  DDX: Asthma, copd, CHF, pna, ptx, malignancy, Pe, anemia   Tina [name] is a 82 y.o. who presents to the ED with chief complaint of shortness of breath as described above. Patient is markedly to Neck and is in moderate respiratory distress. Patient given multiple nebulizer treatments as well as steroids. Presentation seems more consistent with COPD exacerbation based on lung findings. She also with thick cough  therefore will start antibiotics.  The patient will be placed on continuous pulse oximetry and telemetry for monitoring.  Laboratory evaluation will be sent to evaluate for the above complaints.   Clinical Course as of Aug 22 2331  Mon Aug 21, 2016  1952 Patient ambulated short distance and became hypoxic down to 86% with marked tachycardia. Symptoms improved with additional nebulizer treatment and placement on supplement oxygen. Based on her moderate respiratory distress and evidence of acute respiratory failure with hypoxia do feel patient will be needing admission to hospital for further evaluation and management.  Have discussed with the patient and available family all diagnostics and treatments performed thus far and all questions were answered to the best of my ability. The patient demonstrates understanding and agreement with plan.   [PR]    Clinical Course User Index [PR] Merlyn Lot, MD     ____________________________________________   FINAL CLINICAL IMPRESSION(S) / ED DIAGNOSES  Final diagnoses:  COPD exacerbation ([place])  Acute respiratory failure with hypoxia ([place])      NEW MEDICATIONS STARTED DURING THIS VISIT:  Current Discharge Medication List       Note:  This document was prepared using Dragon voice recognition software and may include unintentional dictation errors.    Merlyn Lot,  MD 08/21/16 [id number]

## 2016-08-21 NOTE — ED Notes (Signed)
Ambulated pt on dinamap. Pt oxygen dropped to 92% while walking, HR up to 145. PT sat back in bed and dropped to 85%. Labored breathing after walking across ED. Pt placed on 2 L nasal cannula, informed to breath in nose and out mouth and slow down breathing. Oxygen came up to 96% on 2 L. Will continue to monitor.

## 2016-08-21 NOTE — ED Notes (Signed)
Pt taken to xray via wheelchair

## 2016-08-22 ENCOUNTER — Ambulatory Visit: Payer: Medicare Other | Admitting: Internal Medicine

## 2016-08-22 LAB — BASIC METABOLIC PANEL
ANION GAP: 10 (ref 5–15)
BUN: 29 mg/dL — AB (ref 6–20)
CO2: 25 mmol/L (ref 22–32)
Calcium: 9.7 mg/dL (ref 8.9–10.3)
Chloride: 104 mmol/L (ref 101–111)
Creatinine, Ser: 0.71 mg/dL (ref 0.44–1.00)
Glucose, Bld: 158 mg/dL — ABNORMAL HIGH (ref 65–99)
POTASSIUM: 4.2 mmol/L (ref 3.5–5.1)
SODIUM: 139 mmol/L (ref 135–145)

## 2016-08-22 LAB — CBC
HEMATOCRIT: 42.9 % (ref 35.0–47.0)
Hemoglobin: 14.9 g/dL (ref 12.0–16.0)
MCH: 31.2 pg (ref 26.0–34.0)
MCHC: 34.6 g/dL (ref 32.0–36.0)
MCV: 90.1 fL (ref 80.0–100.0)
Platelets: 257 10*3/uL (ref 150–440)
RBC: 4.77 MIL/uL (ref 3.80–5.20)
RDW: 14.3 % (ref 11.5–14.5)
WBC: 4.3 10*3/uL (ref 3.6–11.0)

## 2016-08-22 LAB — GLUCOSE, CAPILLARY
Glucose-Capillary: 188 mg/dL — ABNORMAL HIGH (ref 65–99)
Glucose-Capillary: 193 mg/dL — ABNORMAL HIGH (ref 65–99)

## 2016-08-22 MED ORDER — INSULIN ASPART 100 UNIT/ML ~~LOC~~ SOLN: 0.0000 [IU] | Freq: Every day | SUBCUTANEOUS | Status: DC

## 2016-08-22 MED ORDER — GUAIFENESIN ER 600 MG PO TB12
600.0000 mg | ORAL_TABLET | Freq: Two times a day (BID) | ORAL | Status: DC
  Administered 2016-08-22 – 2016-08-23 (×3): 600 mg via ORAL
  Filled 2016-08-22 (×3): qty 1

## 2016-08-22 MED ORDER — GUAIFENESIN-DM 100-10 MG/5ML PO SYRP
5.0000 mL | ORAL_SOLUTION | ORAL | Status: DC | PRN
  Administered 2016-08-22: 5 mL via ORAL
  Filled 2016-08-22: qty 5

## 2016-08-22 MED ORDER — INSULIN ASPART 100 UNIT/ML ~~LOC~~ SOLN
0.0000 [IU] | Freq: Three times a day (TID) | SUBCUTANEOUS | Status: DC
  Administered 2016-08-23: 2 [IU] via SUBCUTANEOUS
  Filled 2016-08-22 (×2): qty 2

## 2016-08-22 NOTE — Progress Notes (Signed)
After explaining the importance of wearing the SCD hose/TED hose, patient states wearing the SCDs and/or TED hose will cause her to skin to bruise easily and is painful to her lower extremities and therefore refuses to wear.

## 2016-08-22 NOTE — Progress Notes (Signed)
[place] [facility] [place] NAME: Tina [name]    MR#:  [id number]  DATE OF BIRTH:  Dec 06, 1934  SUBJECTIVE:  CHIEF COMPLAINT:   Chief Complaint  Patient presents with  . Shortness of Breath   - Feels her cough is breaking loose and able to spit out more today. -Remains on 3 L oxygen  REVIEW OF SYSTEMS:  Review of Systems  Constitutional: Negative for chills, fever and malaise/fatigue.  HENT: Negative for congestion, ear discharge, hearing loss and nosebleeds.   Eyes: Negative for blurred vision and double vision.  Respiratory: Positive for cough and shortness of breath. Negative for wheezing.   Cardiovascular: Negative for chest pain, palpitations and leg swelling.  Gastrointestinal: Negative for abdominal pain, constipation, diarrhea, nausea and vomiting.  Genitourinary: Negative for dysuria.  Musculoskeletal: Negative for myalgias.  Neurological: Negative for dizziness, sensory change, speech change, focal weakness, seizures and headaches.  Psychiatric/Behavioral: Negative for depression.    DRUG ALLERGIES:   Allergies  Allergen Reactions  . Evista [Raloxifene] Other (See Comments)    Night sweats Night sweats Reaction:  Night sweats   . Fluticasone-Salmeterol Other (See Comments)    Reaction:  Cough     VITALS:  Blood pressure (!) 156/66, pulse 83, temperature 97.9 F (36.6 C), temperature source Oral, resp. rate 18, height 5\' 5"  (1.651 m), weight 60 kg (132 lb 3.2 oz), SpO2 95 %.  PHYSICAL EXAMINATION:  Physical Exam  GENERAL:  82 y.o.-year-old patient lying in the bed with no acute distress.  EYES: Pupils equal, round, reactive to light and accommodation. No scleral icterus. Extraocular muscles intact.  HEENT: Head atraumatic, normocephalic. Oropharynx and nasopharynx clear.  NECK:  Supple, no jugular venous distention. No thyroid enlargement, no tenderness.  LUNGS: tight breath sounds bilaterally with scattered expiratory  wheeze, no rales,rhonchi or crepitation. No use of accessory muscles of respiration.  CARDIOVASCULAR: S1, S2 normal. No murmurs, rubs, or gallops.  ABDOMEN: Soft, nontender, nondistended. Bowel sounds present. No organomegaly or mass.  EXTREMITIES: No pedal edema, cyanosis, or clubbing.  NEUROLOGIC: Cranial nerves II through XII are intact. Muscle strength 5/5 in all extremities. Sensation intact. Gait not checked.  PSYCHIATRIC: The patient is alert and oriented x 3.  SKIN: No obvious rash, lesion, or ulcer.    LABORATORY PANEL:   CBC  Recent Labs Lab 08/22/16 0625  WBC 4.3  HGB 14.9  HCT 42.9  PLT 257   ------------------------------------------------------------------------------------------------------------------  Chemistries   Recent Labs Lab 08/21/16 1748 08/22/16 0625  NA 137 139  K 3.3* 4.2  CL 101 104  CO2 26 25  GLUCOSE 174* 158*  BUN 25* 29*  CREATININE 1.16* 0.71  CALCIUM 9.6 9.7  MG 1.9  --    ------------------------------------------------------------------------------------------------------------------  Cardiac Enzymes  Recent Labs Lab 08/21/16 1748  TROPONINI <0.03   ------------------------------------------------------------------------------------------------------------------  RADIOLOGY:  Dg Chest 2 View  Result Date: 08/21/2016 CLINICAL DATA:  Increasing shortness of breath with nonproductive cough EXAM: CHEST  2 VIEW COMPARISON:  06/14/2016 FINDINGS: Cardiac shadow is stable. Aortic calcifications are again seen. Hyperinflation of the lungs is again noted consistent with COPD. Mild increased basilar density is noted on the right consistent with atelectasis. No focal confluent infiltrate is noted. No acute bony abnormality is noted. IMPRESSION: Mild right basilar atelectasis. Electronically Signed   By: Inez Catalina M.D.   On: 08/21/2016 18:27    EKG:   Orders placed or performed during the hospital encounter of 08/21/16  .  ED EKG  .  ED EKG    ASSESSMENT AND PLAN:   82 year old female with past medical history of hypertension, COPD, history of polycythemia vera, osteoporosis, hyperlipidemia, coronary artery disease, who presents to the hospital due to shortness of breath.  #1 acute on chronic COPD exacerbation-with acute bronchitis. -Chest x-ray with right basilar atelectasis. Continue IV steroids at the same dose today. -Continue duo nebs and Pulmicort nebs. -On azithromycin. Continue cough medications. -Not on home oxygen currently requiring 3 L oxygen. Wean as tolerated and assess for home O2 needs.  #2 hypertension-continue Norvasc, metoprolol, losartan and triamterene/HCTZ  #3 hyperlipidemia-on statin  #4 osteoporosis on calcium supplements and vitamin D  #5 DVT prophylaxis-on Lovenox     All the records are reviewed and case discussed with Care Management/Social Workerr. Management plans discussed with the patient, family and they are in agreement.  CODE STATUS: Full code  TOTAL TIME TAKING CARE OF THIS PATIENT: 37 minutes.   POSSIBLE D/C IN 1-2 DAYS, DEPENDING ON CLINICAL CONDITION.   Gladstone Lighter M.D on 08/22/2016 at 8:01 AM  Between 7am to 6pm - Pager - [phone]  After 6pm go to [email or web address] - password [email or web address] [facility]  Office  [phone]  CC: Primary care physician; Einar Pheasant, MD

## 2016-08-23 ENCOUNTER — Telehealth: Payer: Self-pay | Admitting: *Deleted

## 2016-08-23 LAB — GLUCOSE, CAPILLARY: GLUCOSE-CAPILLARY: 170 mg/dL — AB (ref 65–99)

## 2016-08-23 MED ORDER — ALPRAZOLAM 0.5 MG PO TABS: 0.2500 mg | ORAL_TABLET | Freq: Three times a day (TID) | ORAL | Status: DC | PRN

## 2016-08-23 MED ORDER — GUAIFENESIN ER 600 MG PO TB12: 600.0000 mg | ORAL_TABLET | Freq: Two times a day (BID) | ORAL | 0 refills | Status: DC

## 2016-08-23 MED ORDER — ALBUTEROL SULFATE HFA 108 (90 BASE) MCG/ACT IN AERS: 2.0000 | INHALATION_SPRAY | Freq: Four times a day (QID) | RESPIRATORY_TRACT | 2 refills | Status: DC | PRN

## 2016-08-23 MED ORDER — AZITHROMYCIN 250 MG PO TABS: 250.0000 mg | ORAL_TABLET | Freq: Every day | ORAL | 0 refills | Status: DC

## 2016-08-23 MED ORDER — BENZONATATE 100 MG PO CAPS: 100.0000 mg | ORAL_CAPSULE | Freq: Three times a day (TID) | ORAL | 0 refills | Status: DC

## 2016-08-23 MED ORDER — TRIAMTERENE-HCTZ 37.5-25 MG PO TABS: 0.5000 | ORAL_TABLET | Freq: Every day | ORAL | 2 refills | Status: DC

## 2016-08-23 MED ORDER — PREDNISONE 10 MG (21) PO TBPK: ORAL_TABLET | ORAL | 0 refills | Status: DC

## 2016-08-23 NOTE — Telephone Encounter (Signed)
Pt will discharge from [facility] on 04/11, pt will need an appt  Pt contact [phone]

## 2016-08-23 NOTE — Discharge Summary (Signed)
[employer] [facility] NAME: Tina [name]    MR#:  [id number]  DATE OF BIRTH:  Dec 27, 1934  DATE OF ADMISSION:  08/21/2016   ADMITTING PHYSICIAN: Henreitta Leber, MD  DATE OF DISCHARGE: 08/23/2016 11:46 AM  PRIMARY CARE PHYSICIAN: Einar Pheasant, MD   ADMISSION DIAGNOSIS:   COPD exacerbation ([place]) [J44.1] Acute respiratory failure with hypoxia ([place]) [J96.01]  DISCHARGE DIAGNOSIS:   Active Problems:   COPD exacerbation ([place])   SECONDARY DIAGNOSIS:   Past Medical History:  Diagnosis Date  . Anemia   . Asthma   . BRCA positive   . CAD (coronary artery disease)   . COPD (chronic obstructive pulmonary disease) ([place])   . Emphysema lung ([place])   . GERD (gastroesophageal reflux disease)   . History of colon polyps   . Hypercholesterolemia   . Hyperglycemia   . Hypertension   . Lung nodules   . Osteoporosis   . Personal history of tobacco use, presenting hazards to health 11/25/2014  . Polycythemia vera(238.4)   . Renal cyst   . Skin cancer     HOSPITAL COURSE:   82 year old female with past medical history of hypertension, COPD, history of polycythemia vera, osteoporosis, hyperlipidemia, coronary artery disease, who presents to the hospital due to shortness of breath.  #1 acute on chronic COPD exacerbation-with acute bronchitis. -Chest x-ray with right basilar atelectasis. received IV steroids - discharged on oral prednisone taper. -on duo nebs and inhalers. -On azithromycin. Continue cough medications. -Requiring 3 L oxygen when she got admitted. However weaned off oxygen prior to discharge.  #2 hypertension-continue Norvasc, metoprolol, losartan and triamterene/HCTZ  #3 hyperlipidemia-on statin  #4 osteoporosis on calcium supplements and vitamin D  Feels stable and ready for discharge today  DISCHARGE CONDITIONS:   Guarded  CONSULTS OBTAINED:   None  DRUG ALLERGIES:   Allergies  Allergen Reactions  .  Evista [Raloxifene] Other (See Comments)    Night sweats Night sweats Reaction:  Night sweats   . Fluticasone-Salmeterol Other (See Comments)    Reaction:  Cough    DISCHARGE MEDICATIONS:   Allergies as of 08/23/2016      Reactions   Evista [raloxifene] Other (See Comments)   Night sweats Night sweats Reaction:  Night sweats    Fluticasone-salmeterol Other (See Comments)   Reaction:  Cough       Medication List    TAKE these medications   albuterol 108 (90 Base) MCG/ACT inhaler Commonly known as:  PROVENTIL HFA;VENTOLIN HFA Inhale 2 puffs into the lungs every 6 (six) hours as needed for wheezing or shortness of breath.   amLODipine 5 MG tablet Commonly known as:  NORVASC TAKE ONE TABLET BY MOUTH EVERY DAY   aspirin EC 81 MG tablet Take 81 mg by mouth daily.   azithromycin 250 MG tablet Commonly known as:  ZITHROMAX Take 1 tablet (250 mg total) by mouth daily. X 4 more days Start taking on:  08/24/2016   benzonatate 100 MG capsule Commonly known as:  TESSALON PERLES Take 1 capsule (100 mg total) by mouth 3 (three) times daily.   Calcium-D 600-400 MG-UNIT Tabs Take 1 tablet by mouth daily.   cholecalciferol 1000 units tablet Commonly known as:  VITAMIN D Take 1,000 Units by mouth daily.   fluticasone 50 MCG/ACT nasal spray Commonly known as:  FLONASE Place 2 sprays into the nose daily.   guaiFENesin 600 MG 12 hr tablet Commonly known as:  MUCINEX Take 1  tablet (600 mg total) by mouth 2 (two) times daily.   ipratropium-albuterol 0.5-2.5 (3) MG/3ML Soln Commonly known as:  DUONEB Take 3 mLs by nebulization every 6 (six) hours as needed (for shortness of breath).   losartan 100 MG tablet Commonly known as:  COZAAR TAKE ONE TABLET BY MOUTH EVERY DAY   lovastatin 40 MG tablet Commonly known as:  MEVACOR Take 1 tablet by mouth  daily   metoprolol tartrate 25 MG tablet Commonly known as:  LOPRESSOR TAKE ONE-HALF TABLET BY  MOUTH TWO TIMES DAILY     predniSONE 10 MG (21) Tbpk tablet Commonly known as:  STERAPRED UNI-PAK 21 TAB 6 tabs PO x 1 day 5 tabs PO x 1 day 4 tabs PO x 1 day 3 tabs PO x 1 day 2 tabs PO x 1 day 1 tab PO x 1 day and stop What changed:  additional instructions   SYMBICORT 160-4.5 MCG/ACT inhaler Generic drug:  budesonide-formoterol Use 2 puffs two times daily   triamterene-hydrochlorothiazide 37.5-25 MG tablet Commonly known as:  MAXZIDE-25 Take 0.5 tablets by mouth daily. What changed:  See the new instructions.        DISCHARGE INSTRUCTIONS:   1. PCP follow-up in 1-2 weeks  DIET:   Cardiac diet  ACTIVITY:   Activity as tolerated  OXYGEN:   Home Oxygen: No.  Oxygen Delivery: room air  DISCHARGE LOCATION:   home   If you experience worsening of your admission symptoms, develop shortness of breath, life threatening emergency, suicidal or homicidal thoughts you must seek medical attention immediately by calling 911 or calling your MD immediately  if symptoms less severe.  You Must read complete instructions/literature along with all the possible adverse reactions/side effects for all the Medicines you take and that have been prescribed to you. Take any new Medicines after you have completely understood and accpet all the possible adverse reactions/side effects.   Please note  You were cared for by a hospitalist during your hospital stay. If you have any questions about your discharge medications or the care you received while you were in the hospital after you are discharged, you can call the unit and asked to speak with the hospitalist on call if the hospitalist that took care of you is not available. Once you are discharged, your primary care physician will handle any further medical issues. Please note that NO REFILLS for any discharge medications will be authorized once you are discharged, as it is imperative that you return to your primary care physician (or establish a relationship with a  primary care physician if you do not have one) for your aftercare needs so that they can reassess your need for medications and monitor your lab values.    On the day of Discharge:  VITAL SIGNS:   Blood pressure (!) 176/78, pulse 95, temperature 98.3 F (36.8 C), temperature source Oral, resp. rate (!) 22, height _0  (1.651 m), weight 60 kg (132 lb 3.2 oz), SpO2 92 %.  PHYSICAL EXAMINATION:    GENERAL:  82 y.o.-year-old patient lying in the bed with no acute distress.  EYES: Pupils equal, round, reactive to light and accommodation. No scleral icterus. Extraocular muscles intact.  HEENT: Head atraumatic, normocephalic. Oropharynx and nasopharynx clear.  NECK:  Supple, no jugular venous distention. No thyroid enlargement, no tenderness.  LUNGS: Improved  breath sounds bilaterally with some scattered expiratory wheeze, no rales,rhonchi or crepitation. No use of accessory muscles of respiration.  CARDIOVASCULAR: S1, S2 normal. No  murmurs, rubs, or gallops.  ABDOMEN: Soft, nontender, nondistended. Bowel sounds present. No organomegaly or mass.  EXTREMITIES: No pedal edema, cyanosis, or clubbing.  NEUROLOGIC: Cranial nerves II through XII are intact. Muscle strength 5/5 in all extremities. Sensation intact. Gait not checked.  PSYCHIATRIC: The patient is alert and oriented x 3.  SKIN: No obvious rash, lesion, or ulcer.   DATA REVIEW:   CBC  Recent Labs Lab 08/22/16 0625  WBC 4.3  HGB 14.9  HCT 42.9  PLT 257    Chemistries   Recent Labs Lab 08/21/16 1748 08/22/16 0625  NA 137 139  K 3.3* 4.2  CL 101 104  CO2 26 25  GLUCOSE 174* 158*  BUN 25* 29*  CREATININE 1.16* 0.71  CALCIUM 9.6 9.7  MG 1.9  --      Microbiology Results  Results for orders placed or performed during the hospital encounter of 06/14/16  Culture, sputum-assessment     Status: None   Collection Time: 06/15/16  9:45 AM  Result Value Ref Range Status   Specimen Description EXPECTORATED SPUTUM  Final    Special Requests NONE  Final   Sputum evaluation   Final    Sputum specimen not acceptable for testing.  Please recollect.   REQUEST FOR RECOLLECT CALLED TO TANYA BERRY AT 9030 ON 06/15/16 [place].    Report Status 06/22/2016 FINAL  Final    RADIOLOGY:  No results found.   Management plans discussed with the patient, family and they are in agreement.  CODE STATUS:     Code Status Orders        Start     Ordered   08/21/16 2203  Full code  Continuous     08/21/16 2204    Code Status History    Date Active Date Inactive Code Status Order ID Comments User Context   06/14/2016  8:28 PM 06/17/2016  4:21 PM Full Code [id number]  Theodoro Grist, MD Inpatient   11/14/2014  8:57 AM 11/15/2014  3:57 PM Full Code [id number]  Aldean Jewett, MD Inpatient      TOTAL TIME TAKING CARE OF THIS PATIENT: 37 minutes.    Gladstone Lighter M.D on 08/23/2016 at 3:09 PM  Between 7am to 6pm - Pager - [phone]  After 6pm go to [email or web address] - Proofreader  [facility]  Hospitalists  Office  [phone]  CC: Primary care physician; Einar Pheasant, MD   Note: This dictation was prepared with Dragon dictation along with smaller phrase technology. Any transcriptional errors that result from this process are unintentional.

## 2016-08-23 NOTE — Telephone Encounter (Signed)
Please advise 

## 2016-08-24 ENCOUNTER — Other Ambulatory Visit: Payer: Self-pay | Admitting: Internal Medicine

## 2016-08-24 NOTE — Telephone Encounter (Signed)
Refilled: 03/08/2015 Last OV: 05/23/2016 Next OV: not scheduled.

## 2016-08-24 NOTE — Telephone Encounter (Signed)
rx ok'd for symbicort

## 2016-08-25 NOTE — Telephone Encounter (Signed)
Given the nature of her admission (COPD exacerbation) and given persistent symptoms, I called her pulmonologist office.  He can see her 08/30/16 at 3:00.  If acute problems before, needs to be seen.  Thanks

## 2016-08-25 NOTE — Telephone Encounter (Signed)
Transition Care Management Follow-up Telephone Call  How have you been since you were released from the hospital? Patient stated not feeling good , still SOB , can only ambulate to restroom or short distances without stopping. Speaking in full sentences with stopping for breath.    Do you understand why you were in the hospital? Yes   Do you understand the discharge instrcutions? Yes  Items Reviewed:  Medications reviewed: Yes, patient using nebulizer every fours Duoneb.  Allergies reviewed: Yes,  Dietary changes reviewed: Low sodium  Referrals reviewed: Yes   Functional Questionnaire:   Activities of Daily Living (ADLs):   She states they are independent in the following: All ADL's States they require assistance with the following: Requires no assist at this time per patient statement.   Any transportation issues/concerns?: NO   Any patient concerns? Yes, should I use nebulizer every four hours the DUONEB, sig reads every six hours.   Confirmed importance and date/time of follow-up visits scheduled: yes   Confirmed with patient if condition begins to worsen call PCP or go to the ER.  Patient was given the Call-a-Nurse line [phone]: Yes,

## 2016-08-25 NOTE — Telephone Encounter (Signed)
Patient listed as guarded when discharged from [facility] the earliest appointment was 09/06/16 at 11 . Patient is OK with this appointment , but with condition guarded and patient statement she can only ambulate short distance without  SOB. Patient also using DUONEB every four hours instead of every six hours. Advised patient should begin to feel worse over the weekend she should return to ER.

## 2016-08-25 NOTE — Telephone Encounter (Signed)
Patient notified and voiced understanding and will keep appointment with pulmonology .

## 2016-08-30 DIAGNOSIS — R0609 Other forms of dyspnea: Secondary | ICD-10-CM | POA: Diagnosis not present

## 2016-08-30 DIAGNOSIS — R600 Localized edema: Secondary | ICD-10-CM | POA: Diagnosis not present

## 2016-08-30 DIAGNOSIS — J449 Chronic obstructive pulmonary disease, unspecified: Secondary | ICD-10-CM | POA: Diagnosis not present

## 2016-09-06 ENCOUNTER — Encounter: Payer: Self-pay | Admitting: Internal Medicine

## 2016-09-06 ENCOUNTER — Ambulatory Visit (INDEPENDENT_AMBULATORY_CARE_PROVIDER_SITE_OTHER): Payer: Medicare Other | Admitting: Internal Medicine

## 2016-09-06 VITALS — BP 144/60 | HR 64 | Temp 98.2°F | Resp 14 | Ht 65.0 in | Wt 132.6 lb

## 2016-09-06 DIAGNOSIS — I10 Essential (primary) hypertension: Secondary | ICD-10-CM | POA: Diagnosis not present

## 2016-09-06 DIAGNOSIS — R739 Hyperglycemia, unspecified: Secondary | ICD-10-CM | POA: Diagnosis not present

## 2016-09-06 DIAGNOSIS — J441 Chronic obstructive pulmonary disease with (acute) exacerbation: Secondary | ICD-10-CM | POA: Diagnosis not present

## 2016-09-06 DIAGNOSIS — E7800 Pure hypercholesterolemia, unspecified: Secondary | ICD-10-CM

## 2016-09-06 DIAGNOSIS — I2510 Atherosclerotic heart disease of native coronary artery without angina pectoris: Secondary | ICD-10-CM | POA: Diagnosis not present

## 2016-09-06 DIAGNOSIS — N183 Chronic kidney disease, stage 3 unspecified: Secondary | ICD-10-CM

## 2016-09-06 DIAGNOSIS — J449 Chronic obstructive pulmonary disease, unspecified: Secondary | ICD-10-CM | POA: Diagnosis not present

## 2016-09-06 LAB — HEPATIC FUNCTION PANEL
ALBUMIN: 4.1 g/dL (ref 3.5–5.2)
ALK PHOS: 53 U/L (ref 39–117)
ALT: 19 U/L (ref 0–35)
AST: 16 U/L (ref 0–37)
BILIRUBIN DIRECT: 0.1 mg/dL (ref 0.0–0.3)
Total Bilirubin: 0.9 mg/dL (ref 0.2–1.2)
Total Protein: 6.8 g/dL (ref 6.0–8.3)

## 2016-09-06 LAB — BASIC METABOLIC PANEL
BUN: 24 mg/dL — ABNORMAL HIGH (ref 6–23)
CHLORIDE: 103 meq/L (ref 96–112)
CO2: 31 meq/L (ref 19–32)
CREATININE: 1.08 mg/dL (ref 0.40–1.20)
Calcium: 9.7 mg/dL (ref 8.4–10.5)
GFR: 51.91 mL/min — ABNORMAL LOW (ref >60.00)
Glucose, Bld: 119 mg/dL — ABNORMAL HIGH (ref 70–99)
Potassium: 3.4 mEq/L — ABNORMAL LOW (ref 3.5–5.1)
Sodium: 139 mEq/L (ref 135–145)

## 2016-09-06 NOTE — Progress Notes (Signed)
[name], female   DOB: [birth year], 82 y.o.   MRN: [id number]   Subjective:    [name], female    DOB: July 12, 1934, 82 y.o.   MRN: [id number]  HPI  Patient here for hospital follow up.  Was admitted on 08/21/16 with COPD exacerbation.  Diagnosed with acute respiratory failure with hypoxia.  Received IV steroids and sent home with oral prednisone.  Continues on duo nebs and inhalers.  Completed abx.  Weaned off O2 prior to discharge.  She reports she is breathing better.  Still not back to her baseline, but is better.  No chest pain.  No acid reflux.  No abdominal pain.  Bowels moving.  Was having problems with pedal and ankle swelling.  Saw Dr Raul Del.  Note reviewed.  Did not increase fluid pill.  Swelling is better.     Past Medical History:  Diagnosis Date  . Anemia   . Asthma   . BRCA positive   . CAD (coronary artery disease)   . COPD (chronic obstructive pulmonary disease) ([place])   . Emphysema lung ([place])   . GERD (gastroesophageal reflux disease)   . History of colon polyps   . Hypercholesterolemia   . Hyperglycemia   . Hypertension   . Lung nodules   . Osteoporosis   . Personal history of tobacco use, presenting hazards to health 11/25/2014  . Polycythemia vera(238.4)   . Renal cyst   . Skin cancer    Past Surgical History:  Procedure Laterality Date  . ANGIOPLASTY     coronary (x1)  . COLONOSCOPY WITH PROPOFOL N/A 03/02/2015   Procedure: COLONOSCOPY WITH PROPOFOL;  Surgeon: Lollie Sails, MD;  Location: [facility] ENDOSCOPY;  Service: Endoscopy;  Laterality: N/A;  . CORONARY ANGIOPLASTY    . CORONARY ARTERY BYPASS GRAFT    . SALPINGOOPHORECTOMY    . TONSILECTOMY/ADENOIDECTOMY WITH MYRINGOTOMY    . TUBAL LIGATION     Family History  Problem Relation Age of Onset  . Cirrhosis Father     died age 51  . Alcohol abuse Father   . Asthma Mother   . Congestive Heart Failure Mother   . Breast cancer Mother     2 (1/2 sisters)  .  Osteoarthritis Mother   . Colon cancer Mother   . Lupus Sister   . Alcohol abuse Sister   . Ovarian cancer Sister   . Osteoporosis Sister   . Skin cancer Sister   . Breast cancer Cousin   . Breast cancer Maternal Aunt    Social History   Social History  . Marital status: Single    Spouse name: N/A  . Number of children: 2  . Years of education: N/A   Social History Main Topics  . Smoking status: Former Smoker    Packs/day: 1.00    Years: 45.00    Types: Cigarettes    Quit date: 11/11/2003  . Smokeless tobacco: Never Used  . Alcohol use No     Comment: occasional  . Drug use: No  . Sexual activity: Not Asked   Other Topics Concern  . None   Social History Narrative  . None    Outpatient Encounter Prescriptions as of 09/06/2016  Medication Sig  . albuterol (PROVENTIL HFA;VENTOLIN HFA) 108 (90 Base) MCG/ACT inhaler Inhale 2 puffs into the lungs every 6 (six) hours as needed for wheezing or shortness of breath.  Marland Kitchen amLODipine (NORVASC) 5 MG tablet TAKE ONE TABLET BY MOUTH  EVERY DAY  . aspirin EC 81 MG tablet Take 81 mg by mouth daily.  . Calcium Carbonate-Vitamin D (CALCIUM-D) 600-400 MG-UNIT TABS Take 1 tablet by mouth daily.  . cholecalciferol (VITAMIN D) 1000 units tablet Take 1,000 Units by mouth daily.  . fluticasone (FLONASE) 50 MCG/ACT nasal spray Place 2 sprays into the nose daily.  Marland Kitchen ipratropium-albuterol (DUONEB) 0.5-2.5 (3) MG/3ML SOLN Take 3 mLs by nebulization every 6 (six) hours as needed (for shortness of breath).  . losartan (COZAAR) 100 MG tablet TAKE ONE TABLET BY MOUTH EVERY DAY  . lovastatin (MEVACOR) 40 MG tablet Take 1 tablet by mouth  daily  . metoprolol tartrate (LOPRESSOR) 25 MG tablet TAKE ONE-HALF TABLET BY  MOUTH TWO TIMES DAILY  . SYMBICORT 160-4.5 MCG/ACT inhaler USE 2 PUFFS TWO TIMES DAILY  . triamterene-hydrochlorothiazide (MAXZIDE-25) 37.5-25 MG tablet Take 0.5 tablets by mouth daily.  . [DISCONTINUED] azithromycin (ZITHROMAX) 250 MG tablet  Take 1 tablet (250 mg total) by mouth daily. X 4 more days  . [DISCONTINUED] benzonatate (TESSALON PERLES) 100 MG capsule Take 1 capsule (100 mg total) by mouth 3 (three) times daily.  . [DISCONTINUED] guaiFENesin (MUCINEX) 600 MG 12 hr tablet Take 1 tablet (600 mg total) by mouth 2 (two) times daily.  . [DISCONTINUED] predniSONE (STERAPRED UNI-PAK 21 TAB) 10 MG (21) TBPK tablet 6 tabs PO x 1 day 5 tabs PO x 1 day 4 tabs PO x 1 day 3 tabs PO x 1 day 2 tabs PO x 1 day 1 tab PO x 1 day and stop   No facility-administered encounter medications on file as of 09/06/2016.     Review of Systems  Constitutional: Negative for appetite change and unexpected weight change.  HENT: Negative for sinus pressure and sore throat.   Respiratory: Negative for cough and chest tightness.        Still some sob.  Breathing is better.    Cardiovascular: Positive for leg swelling. Negative for chest pain and palpitations.  Gastrointestinal: Negative for abdominal pain, diarrhea, nausea and vomiting.  Genitourinary: Negative for difficulty urinating and dysuria.  Musculoskeletal: Negative for back pain and joint swelling.  Skin: Negative for color change and rash.  Neurological: Negative for dizziness, light-headedness and headaches.  Psychiatric/Behavioral: Negative for agitation and dysphoric mood.       Objective:    Physical Exam  Constitutional: She appears well-developed and well-nourished. No distress.  HENT:  Nose: Nose normal.  Mouth/Throat: Oropharynx is clear and moist.  Neck: Neck supple. No thyromegaly present.  Cardiovascular: Normal rate and regular rhythm.   Pulmonary/Chest: Breath sounds normal. No respiratory distress. She has no wheezes.  Abdominal: Soft. Bowel sounds are normal. There is no tenderness.  Musculoskeletal: She exhibits no tenderness.  Minimal pedal and ankle edema.  (improved per pt).    Lymphadenopathy:    She has no cervical adenopathy.  Skin: No rash noted. No  erythema.  Psychiatric: She has a normal mood and affect. Her behavior is normal.    BP (!) 144/60 (BP Location: Left Arm, Patient Position: Sitting, Cuff Size: Normal)   Pulse 64   Temp 98.2 F (36.8 C) (Oral)   Resp 14   Ht _0  (1.651 m)   Wt 132 lb 9.6 oz (60.1 kg)   SpO2 96%   BMI 22.07 kg/m  Wt Readings from Last 3 Encounters:  09/06/16 132 lb 9.6 oz (60.1 kg)  08/21/16 132 lb 3.2 oz (60 kg)  06/17/16 128 lb 3.2 oz (  58.2 kg)     Lab Results  Component Value Date   WBC 4.3 08/22/2016   HGB 14.9 08/22/2016   HCT 42.9 08/22/2016   PLT 257 08/22/2016   GLUCOSE 119 (H) 09/06/2016   CHOL 158 02/16/2016   TRIG 86.0 02/16/2016   HDL 60.10 02/16/2016   LDLCALC 81 02/16/2016   ALT 19 09/06/2016   AST 16 09/06/2016   NA 139 09/06/2016   K 3.4 (L) 09/06/2016   CL 103 09/06/2016   CREATININE 1.08 09/06/2016   BUN 24 (H) 09/06/2016   CO2 31 09/06/2016   TSH 2.620 05/28/2015   HGBA1C 5.7 (H) 06/14/2016    Dg Chest 2 View  Result Date: 08/21/2016 CLINICAL DATA:  Increasing shortness of breath with nonproductive cough EXAM: CHEST  2 VIEW COMPARISON:  06/14/2016 FINDINGS: Cardiac shadow is stable. Aortic calcifications are again seen. Hyperinflation of the lungs is again noted consistent with COPD. Mild increased basilar density is noted on the right consistent with atelectasis. No focal confluent infiltrate is noted. No acute bony abnormality is noted. IMPRESSION: Mild right basilar atelectasis. Electronically Signed   By: Inez Catalina M.D.   On: 08/21/2016 18:27       Assessment & Plan:   Problem List Items Addressed This Visit    CAD (coronary artery disease)    Followed by cardiology.  Stable.        CKD (chronic kidney disease), stage III   COPD (chronic obstructive pulmonary disease) (HCC)    Recently admitted with COPD exacerbation and acute respiratory failure with hypoxia.  Treated with IV steroids and then oral prednisone.  Also treated with duo nebs and  azithromycin.  Weaned off O2.  Saw Dr Raul Del after discharge.  No changes made in medications.  Breathing is better.  Continue inhalers.        COPD exacerbation ([place])    Recently admitted with COPD exacerbation as outlined.  Treated as outlined.  Breathing improved.        Essential hypertension - Primary    Blood pressure as outlined.  Same medication regimen.  Follow pressures.        Relevant Orders   Basic metabolic panel (Completed)   Hypercholesterolemia   Relevant Orders   Hepatic function panel (Completed)   Hyperglycemia       Einar Pheasant, MD

## 2016-09-06 NOTE — Progress Notes (Signed)
Pre-visit discussion using our clinic review tool. No additional management support is needed unless otherwise documented below in the visit note.  

## 2016-09-16 ENCOUNTER — Encounter: Payer: Self-pay | Admitting: Internal Medicine

## 2016-09-16 NOTE — Assessment & Plan Note (Signed)
Recently admitted with COPD exacerbation and acute respiratory failure with hypoxia.  Treated with IV steroids and then oral prednisone.  Also treated with duo nebs and azithromycin.  Weaned off O2.  Saw Dr Raul Del after discharge.  No changes made in medications.  Breathing is better.  Continue inhalers.

## 2016-09-16 NOTE — Assessment & Plan Note (Signed)
Blood pressure as outlined.  Same medication regimen.  Follow pressures.

## 2016-09-16 NOTE — Assessment & Plan Note (Signed)
Recently admitted with COPD exacerbation as outlined.  Treated as outlined.  Breathing improved.

## 2016-09-16 NOTE — Assessment & Plan Note (Signed)
Followed by cardiology. Stable.   

## 2016-09-19 ENCOUNTER — Other Ambulatory Visit (INDEPENDENT_AMBULATORY_CARE_PROVIDER_SITE_OTHER): Payer: Medicare Other

## 2016-09-19 DIAGNOSIS — I10 Essential (primary) hypertension: Secondary | ICD-10-CM | POA: Diagnosis not present

## 2016-09-20 LAB — BASIC METABOLIC PANEL
BUN: 26 mg/dL — AB (ref 6–23)
CHLORIDE: 103 meq/L (ref 96–112)
CO2: 30 meq/L (ref 19–32)
Calcium: 9.8 mg/dL (ref 8.4–10.5)
Creatinine, Ser: 1.28 mg/dL — ABNORMAL HIGH (ref 0.40–1.20)
GFR: 42.67 mL/min — ABNORMAL LOW (ref >60.00)
GLUCOSE: 114 mg/dL — AB (ref 70–99)
Potassium: 3.8 mEq/L (ref 3.5–5.1)
Sodium: 141 mEq/L (ref 135–145)

## 2016-09-21 ENCOUNTER — Other Ambulatory Visit: Payer: Self-pay | Admitting: Internal Medicine

## 2016-09-21 DIAGNOSIS — R7989 Other specified abnormal findings of blood chemistry: Secondary | ICD-10-CM

## 2016-09-21 NOTE — Addendum Note (Signed)
Addended by: Francella Solian on: 09/21/2016 10:19 AM   Modules accepted: Orders

## 2016-09-21 NOTE — Progress Notes (Signed)
Order placed for f/u met b.  

## 2016-09-28 DIAGNOSIS — I10 Essential (primary) hypertension: Secondary | ICD-10-CM | POA: Diagnosis not present

## 2016-09-29 LAB — BASIC METABOLIC PANEL
BUN/Creatinine Ratio: 16 (ref 12–28)
BUN: 19 mg/dL (ref 8–27)
CALCIUM: 9.9 mg/dL (ref 8.7–10.3)
CO2: 26 mmol/L (ref 18–29)
CREATININE: 1.17 mg/dL — AB (ref 0.57–1.00)
Chloride: 100 mmol/L (ref 96–106)
GFR calc Af Amer: 51 mL/min/{1.73_m2} — ABNORMAL LOW (ref >59)
GFR, EST NON AFRICAN AMERICAN: 44 mL/min/{1.73_m2} — AB (ref >59)
Glucose: 129 mg/dL — ABNORMAL HIGH (ref 65–99)
Potassium: 4 mmol/L (ref 3.5–5.2)
SODIUM: 146 mmol/L — AB (ref 134–144)

## 2016-10-11 ENCOUNTER — Other Ambulatory Visit: Payer: Self-pay | Admitting: Internal Medicine

## 2016-10-19 ENCOUNTER — Encounter: Payer: Self-pay | Admitting: Internal Medicine

## 2016-10-19 ENCOUNTER — Telehealth: Payer: Self-pay | Admitting: Internal Medicine

## 2016-10-19 ENCOUNTER — Ambulatory Visit (INDEPENDENT_AMBULATORY_CARE_PROVIDER_SITE_OTHER): Payer: Medicare Other | Admitting: Internal Medicine

## 2016-10-19 VITALS — BP 148/62 | HR 62 | Temp 98.6°F | Resp 12 | Ht 65.0 in | Wt 132.8 lb

## 2016-10-19 DIAGNOSIS — I10 Essential (primary) hypertension: Secondary | ICD-10-CM | POA: Diagnosis not present

## 2016-10-19 DIAGNOSIS — E7800 Pure hypercholesterolemia, unspecified: Secondary | ICD-10-CM

## 2016-10-19 DIAGNOSIS — N183 Chronic kidney disease, stage 3 unspecified: Secondary | ICD-10-CM

## 2016-10-19 DIAGNOSIS — E559 Vitamin D deficiency, unspecified: Secondary | ICD-10-CM

## 2016-10-19 DIAGNOSIS — D45 Polycythemia vera: Secondary | ICD-10-CM | POA: Diagnosis not present

## 2016-10-19 DIAGNOSIS — I2510 Atherosclerotic heart disease of native coronary artery without angina pectoris: Secondary | ICD-10-CM | POA: Diagnosis not present

## 2016-10-19 DIAGNOSIS — R739 Hyperglycemia, unspecified: Secondary | ICD-10-CM

## 2016-10-19 DIAGNOSIS — J449 Chronic obstructive pulmonary disease, unspecified: Secondary | ICD-10-CM

## 2016-10-19 MED ORDER — LOSARTAN POTASSIUM 100 MG PO TABS: 100.0000 mg | ORAL_TABLET | Freq: Every day | ORAL | 1 refills | Status: DC

## 2016-10-19 NOTE — Progress Notes (Signed)
[name], female   DOB: 05-28-1934, 82 y.o.   MRN: [id number]   Subjective:    [name], female    DOB: [birth year], 82 y.o.   MRN: [id number]  HPI  Patient here for a scheduled follow up.  She is doing better.  Feels better.  Breathing better.  No increased cough or congestion.  No chest pain.  No acid reflux.  No abdominal pain or cramping.  Bowels stable.  Planning for stress test 11/03/16.  Eating and drinking well.  Back line dancing.    Past Medical History:  Diagnosis Date  . Anemia   . Asthma   . BRCA positive   . CAD (coronary artery disease)   . COPD (chronic obstructive pulmonary disease) ([place])   . Emphysema lung ([place])   . GERD (gastroesophageal reflux disease)   . History of colon polyps   . Hypercholesterolemia   . Hyperglycemia   . Hypertension   . Lung nodules   . Osteoporosis   . Personal history of tobacco use, presenting hazards to health 11/25/2014  . Polycythemia vera(238.4)   . Renal cyst   . Skin cancer    Past Surgical History:  Procedure Laterality Date  . ANGIOPLASTY     coronary (x1)  . COLONOSCOPY WITH PROPOFOL N/A 03/02/2015   Procedure: COLONOSCOPY WITH PROPOFOL;  Surgeon: Lollie Sails, MD;  Location: [facility] ENDOSCOPY;  Service: Endoscopy;  Laterality: N/A;  . CORONARY ANGIOPLASTY    . CORONARY ARTERY BYPASS GRAFT    . SALPINGOOPHORECTOMY    . TONSILECTOMY/ADENOIDECTOMY WITH MYRINGOTOMY    . TUBAL LIGATION     Family History  Problem Relation Age of Onset  . Cirrhosis Father        died age 4  . Alcohol abuse Father   . Asthma Mother   . Congestive Heart Failure Mother   . Breast cancer Mother        2 (1/2 sisters)  . Osteoarthritis Mother   . Colon cancer Mother   . Lupus Sister   . Alcohol abuse Sister   . Ovarian cancer Sister   . Osteoporosis Sister   . Skin cancer Sister   . Breast cancer Cousin   . Breast cancer Maternal Aunt    Social History   Social History  . Marital status: Single     Spouse name: N/A  . Number of children: 2  . Years of education: N/A   Social History Main Topics  . Smoking status: Former Smoker    Packs/day: 1.00    Years: 45.00    Types: Cigarettes    Quit date: 11/11/2003  . Smokeless tobacco: Never Used  . Alcohol use No     Comment: occasional  . Drug use: No  . Sexual activity: Not Asked   Other Topics Concern  . None   Social History Narrative  . None    Outpatient Encounter Prescriptions as of 10/19/2016  Medication Sig  . albuterol (PROVENTIL HFA;VENTOLIN HFA) 108 (90 Base) MCG/ACT inhaler Inhale 2 puffs into the lungs every 6 (six) hours as needed for wheezing or shortness of breath.  Marland Kitchen amLODipine (NORVASC) 5 MG tablet TAKE ONE TABLET BY MOUTH EVERY DAY  . aspirin EC 81 MG tablet Take 81 mg by mouth daily.  . calcium carbonate (OSCAL) 1500 (600 Ca) MG TABS tablet Take by mouth 2 (two) times daily with a meal.  . cholecalciferol (VITAMIN D) 1000 units tablet Take 1,000  Units by mouth daily.  . fluticasone (FLONASE) 50 MCG/ACT nasal spray Place 2 sprays into the nose daily.  Marland Kitchen ipratropium-albuterol (DUONEB) 0.5-2.5 (3) MG/3ML SOLN Take 3 mLs by nebulization every 6 (six) hours as needed (for shortness of breath).  . losartan (COZAAR) 100 MG tablet Take 1 tablet (100 mg total) by mouth daily.  Marland Kitchen lovastatin (MEVACOR) 40 MG tablet Take 1 tablet by mouth  daily  . metoprolol tartrate (LOPRESSOR) 25 MG tablet TAKE ONE-HALF TABLET BY  MOUTH TWO TIMES DAILY  . SYMBICORT 160-4.5 MCG/ACT inhaler USE 2 PUFFS TWO TIMES DAILY  . triamterene-hydrochlorothiazide (MAXZIDE-25) 37.5-25 MG tablet Take 0.5 tablets by mouth daily.  . [DISCONTINUED] calcium carbonate (OS-CAL - DOSED IN MG OF ELEMENTAL CALCIUM) 1250 (500 Ca) MG tablet Take 1 tablet by mouth.  . [DISCONTINUED] losartan (COZAAR) 100 MG tablet TAKE ONE TABLET BY MOUTH EVERY DAY  . [DISCONTINUED] Calcium Carbonate-Vitamin D (CALCIUM-D) 600-400 MG-UNIT TABS Take 1 tablet by mouth daily.    No facility-administered encounter medications on file as of 10/19/2016.     Review of Systems  Constitutional: Negative for appetite change and unexpected weight change.  HENT: Negative for congestion and sinus pressure.   Respiratory: Negative for cough, chest tightness and shortness of breath.   Cardiovascular: Negative for chest pain, palpitations and leg swelling.  Gastrointestinal: Negative for abdominal pain, diarrhea, nausea and vomiting.  Genitourinary: Negative for difficulty urinating and dysuria.  Musculoskeletal: Negative for back pain and joint swelling.  Skin: Negative for color change and rash.  Neurological: Negative for dizziness, light-headedness and headaches.  Psychiatric/Behavioral: Negative for agitation and dysphoric mood.       Objective:    Physical Exam  Constitutional: She appears well-developed and well-nourished. No distress.  HENT:  Nose: Nose normal.  Mouth/Throat: Oropharynx is clear and moist.  Neck: Neck supple. No thyromegaly present.  Cardiovascular: Normal rate and regular rhythm.   Pulmonary/Chest: Breath sounds normal. No respiratory distress. She has no wheezes.  Abdominal: Soft. Bowel sounds are normal. There is no tenderness.  Musculoskeletal: She exhibits no edema or tenderness.  Lymphadenopathy:    She has no cervical adenopathy.  Skin: No rash noted. No erythema.  Psychiatric: She has a normal mood and affect. Her behavior is normal.    BP (!) 148/62 (BP Location: Left Arm, Patient Position: Sitting, Cuff Size: Normal)   Pulse 62   Temp 98.6 F (37 C) (Oral)   Resp 12   Ht '5\' 5"'  (1.651 m)   Wt 132 lb 12.8 oz (60.2 kg)   SpO2 97%   BMI 22.10 kg/m  Wt Readings from Last 3 Encounters:  10/19/16 132 lb 12.8 oz (60.2 kg)  09/06/16 132 lb 9.6 oz (60.1 kg)  08/21/16 132 lb 3.2 oz (60 kg)     Lab Results  Component Value Date   WBC 4.3 08/22/2016   HGB 14.9 08/22/2016   HCT 42.9 08/22/2016   PLT 257 08/22/2016   GLUCOSE  129 (H) 09/28/2016   CHOL 158 02/16/2016   TRIG 86.0 02/16/2016   HDL 60.10 02/16/2016   LDLCALC 81 02/16/2016   ALT 19 09/06/2016   AST 16 09/06/2016   NA 146 (H) 09/28/2016   K 4.0 09/28/2016   CL 100 09/28/2016   CREATININE 1.17 (H) 09/28/2016   BUN 19 09/28/2016   CO2 26 09/28/2016   TSH 2.620 05/28/2015   HGBA1C 5.7 (H) 06/14/2016    Dg Chest 2 View  Result Date: 08/21/2016 CLINICAL DATA:  Increasing shortness of breath with nonproductive cough EXAM: CHEST  2 VIEW COMPARISON:  06/14/2016 FINDINGS: Cardiac shadow is stable. Aortic calcifications are again seen. Hyperinflation of the lungs is again noted consistent with COPD. Mild increased basilar density is noted on the right consistent with atelectasis. No focal confluent infiltrate is noted. No acute bony abnormality is noted. IMPRESSION: Mild right basilar atelectasis. Electronically Signed   By: Inez Catalina M.D.   On: 08/21/2016 18:27       Assessment & Plan:   Problem List Items Addressed This Visit    CAD (coronary artery disease)    Followed by cardiology.  Planning for stress test 11/03/16.         Relevant Medications   losartan (COZAAR) 100 MG tablet   CKD (chronic kidney disease), stage III    Last creatinine improved 1.17.  Follow.        COPD (chronic obstructive pulmonary disease) ([place])    Recently admitted with COPD exacerbation as outlined in last note.  Treated with IV steroids and oral prednisone.  Off O2.  Sees Dr Raul Del.  Breathing better.  Follow.  Continue current inhaler regimen.        Essential hypertension    Blood pressure as outlined.  Will hold on additional medication.  Follow pressures.  Follow metabolic panel.       Relevant Medications   losartan (COZAAR) 100 MG tablet   Other Relevant Orders   TSH   Basic metabolic panel   Hypercholesterolemia    On lovastatin.  Low cholesterol diet and exercise.  Follow lipid panel and liver function tests.        Relevant Medications    losartan (COZAAR) 100 MG tablet   Other Relevant Orders   Lipid panel   Hepatic function panel   Hyperglycemia    Low carb diet and exercise.  Follow met b and a1c.       Relevant Orders   Hemoglobin A1c   Polycythemia vera ([place])    Was followed by hematology.        Relevant Orders   CBC with Differential/Platelet    Other Visit Diagnoses    Vitamin D deficiency    -  Primary   Relevant Orders   VITAMIN D 25 Hydroxy (Vit-D Deficiency, Fractures)       Einar Pheasant, MD

## 2016-10-19 NOTE — Progress Notes (Signed)
Pre-visit discussion using our clinic review tool. No additional management support is needed unless otherwise documented below in the visit note.  

## 2016-10-21 ENCOUNTER — Encounter: Payer: Self-pay | Admitting: Internal Medicine

## 2016-10-21 DIAGNOSIS — D45 Polycythemia vera: Secondary | ICD-10-CM | POA: Insufficient documentation

## 2016-10-21 NOTE — Assessment & Plan Note (Signed)
Low carb diet and exercise.  Follow met b and a1c.  

## 2016-10-21 NOTE — Assessment & Plan Note (Signed)
Was followed by hematology.

## 2016-10-21 NOTE — Assessment & Plan Note (Addendum)
Followed by cardiology.  Planning for stress test 11/03/16.

## 2016-10-21 NOTE — Assessment & Plan Note (Signed)
Recently admitted with COPD exacerbation as outlined in last note.  Treated with IV steroids and oral prednisone.  Off O2.  Sees Dr Raul Del.  Breathing better.  Follow.  Continue current inhaler regimen.

## 2016-10-21 NOTE — Assessment & Plan Note (Signed)
Last creatinine improved 1.17.  Follow.

## 2016-10-21 NOTE — Assessment & Plan Note (Signed)
On lovastatin.  Low cholesterol diet and exercise.  Follow lipid panel and liver function tests.   

## 2016-10-21 NOTE — Assessment & Plan Note (Signed)
Blood pressure as outlined.  Will hold on additional medication.  Follow pressures.  Follow metabolic panel.

## 2016-10-31 ENCOUNTER — Inpatient Hospital Stay: Payer: Medicare Other | Attending: Oncology | Admitting: Oncology

## 2016-10-31 ENCOUNTER — Encounter: Payer: Self-pay | Admitting: Oncology

## 2016-10-31 ENCOUNTER — Inpatient Hospital Stay: Payer: Medicare Other

## 2016-10-31 VITALS — BP 172/71 | HR 65 | Temp 98.5°F | Resp 20 | Wt 132.9 lb

## 2016-10-31 DIAGNOSIS — Z7982 Long term (current) use of aspirin: Secondary | ICD-10-CM | POA: Diagnosis not present

## 2016-10-31 DIAGNOSIS — Z8041 Family history of malignant neoplasm of ovary: Secondary | ICD-10-CM

## 2016-10-31 DIAGNOSIS — R918 Other nonspecific abnormal finding of lung field: Secondary | ICD-10-CM | POA: Diagnosis not present

## 2016-10-31 DIAGNOSIS — D751 Secondary polycythemia: Secondary | ICD-10-CM | POA: Insufficient documentation

## 2016-10-31 DIAGNOSIS — J449 Chronic obstructive pulmonary disease, unspecified: Secondary | ICD-10-CM | POA: Insufficient documentation

## 2016-10-31 DIAGNOSIS — I10 Essential (primary) hypertension: Secondary | ICD-10-CM | POA: Diagnosis not present

## 2016-10-31 DIAGNOSIS — Z85828 Personal history of other malignant neoplasm of skin: Secondary | ICD-10-CM

## 2016-10-31 DIAGNOSIS — Z1501 Genetic susceptibility to malignant neoplasm of breast: Secondary | ICD-10-CM | POA: Insufficient documentation

## 2016-10-31 DIAGNOSIS — Z79899 Other long term (current) drug therapy: Secondary | ICD-10-CM | POA: Insufficient documentation

## 2016-10-31 DIAGNOSIS — Z86010 Personal history of colonic polyps: Secondary | ICD-10-CM | POA: Diagnosis not present

## 2016-10-31 DIAGNOSIS — Z87891 Personal history of nicotine dependence: Secondary | ICD-10-CM | POA: Diagnosis not present

## 2016-10-31 DIAGNOSIS — E7800 Pure hypercholesterolemia, unspecified: Secondary | ICD-10-CM | POA: Insufficient documentation

## 2016-10-31 DIAGNOSIS — Z803 Family history of malignant neoplasm of breast: Secondary | ICD-10-CM | POA: Insufficient documentation

## 2016-10-31 DIAGNOSIS — M7989 Other specified soft tissue disorders: Secondary | ICD-10-CM | POA: Diagnosis not present

## 2016-10-31 DIAGNOSIS — M818 Other osteoporosis without current pathological fracture: Secondary | ICD-10-CM

## 2016-10-31 DIAGNOSIS — Z800 Family history of malignant neoplasm of digestive organs: Secondary | ICD-10-CM

## 2016-10-31 DIAGNOSIS — K219 Gastro-esophageal reflux disease without esophagitis: Secondary | ICD-10-CM | POA: Diagnosis not present

## 2016-10-31 DIAGNOSIS — I2510 Atherosclerotic heart disease of native coronary artery without angina pectoris: Secondary | ICD-10-CM | POA: Diagnosis not present

## 2016-10-31 LAB — CBC WITH DIFFERENTIAL/PLATELET
BASOS ABS: 0 10*3/uL (ref 0–0.1)
Basophils Relative: 1 %
EOS ABS: 0.2 10*3/uL (ref 0–0.7)
EOS PCT: 2 %
HCT: 45.5 % (ref 35.0–47.0)
Hemoglobin: 16 g/dL (ref 12.0–16.0)
Lymphocytes Relative: 22 %
Lymphs Abs: 1.8 10*3/uL (ref 1.0–3.6)
MCH: 31.2 pg (ref 26.0–34.0)
MCHC: 35.2 g/dL (ref 32.0–36.0)
MCV: 88.7 fL (ref 80.0–100.0)
MONO ABS: 0.7 10*3/uL (ref 0.2–0.9)
Monocytes Relative: 8 %
Neutro Abs: 5.6 10*3/uL (ref 1.4–6.5)
Neutrophils Relative %: 67 %
PLATELETS: 209 10*3/uL (ref 150–440)
RBC: 5.13 MIL/uL (ref 3.80–5.20)
RDW: 14.1 % (ref 11.5–14.5)
WBC: 8.4 10*3/uL (ref 3.6–11.0)

## 2016-10-31 NOTE — Progress Notes (Signed)
Pt here for follow up. Pulse ox today 95% on RA- (normal for her per pt )

## 2016-10-31 NOTE — Progress Notes (Signed)
Repeat b/p was 162/80.

## 2016-10-31 NOTE — Progress Notes (Signed)
Hematology/Oncology Consult note [facility]  [phone] [phone]  Patient Care Team: Einar Pheasant, MD as PCP - General (Internal Medicine)   Name of the patient: Tina [name]  [id number]  18-Apr-1935   Date of visit: 10/31/16  Diagnosis- 1. Secondary polycythemia likely from underlying COPD. Jak 2 negative 2. History of BRCA positivity 3. History of lung nodules- getting low-dose screening CT  Chief complaint/ Reason for visit- routine f/u  Heme/Onc history: Patient was initially seen for her polycythemia in 2012 and at that time she had an EPO level checked which was normal at 12.7. jak2 testing was negative. The cause of polycythemia has been activated to her underlying COPD. she does not require continuous oxygen for the same and findings of COPD were mainly noted on chest x-ray. She has been on inhalers for the same. She has been getting intermittent phlebotomy to keep her hematocrit less than 45. Currently her phelbotomy threshold is hct >50  Interval history- she is very active for her age. Denies any SOB. Occasional leg swelling. Reports that her legs feel tired when she does fast line dancing. They do not hurt with other routine activities  ECOG PS- 0 Pain scale- 0   Review of systems- Review of Systems  Constitutional: Negative for chills, fever, malaise/fatigue and weight loss.  HENT: Negative for congestion, ear discharge and nosebleeds.   Eyes: Negative for blurred vision.  Respiratory: Negative for cough, hemoptysis, sputum production, shortness of breath and wheezing.   Cardiovascular: Negative for chest pain, palpitations, orthopnea and claudication.  Gastrointestinal: Negative for abdominal pain, blood in stool, constipation, diarrhea, heartburn, melena, nausea and vomiting.  Genitourinary: Negative for dysuria, flank pain, frequency, hematuria and urgency.  Musculoskeletal: Negative for back pain, joint pain and  myalgias.       Leg pain  Skin: Negative for rash.  Neurological: Negative for dizziness, tingling, focal weakness, seizures, weakness and headaches.  Endo/Heme/Allergies: Does not bruise/bleed easily.  Psychiatric/Behavioral: Negative for depression and suicidal ideas. The patient does not have insomnia.        Allergies  Allergen Reactions  . Evista [Raloxifene] Other (See Comments)    Night sweats Night sweats Reaction:  Night sweats   . Fluticasone-Salmeterol Other (See Comments)    Reaction:  Cough      Past Medical History:  Diagnosis Date  . Anemia   . Asthma   . BRCA positive   . CAD (coronary artery disease)   . COPD (chronic obstructive pulmonary disease) ([place])   . Emphysema lung ([place])   . GERD (gastroesophageal reflux disease)   . History of colon polyps   . Hypercholesterolemia   . Hyperglycemia   . Hypertension   . Lung nodules   . Osteoporosis   . Personal history of tobacco use, presenting hazards to health 11/25/2014  . Polycythemia vera(238.4)   . Renal cyst   . Skin cancer      Past Surgical History:  Procedure Laterality Date  . ANGIOPLASTY     coronary (x1)  . COLONOSCOPY WITH PROPOFOL N/A 03/02/2015   Procedure: COLONOSCOPY WITH PROPOFOL;  Surgeon: Lollie Sails, MD;  Location: [facility] ENDOSCOPY;  Service: Endoscopy;  Laterality: N/A;  . CORONARY ANGIOPLASTY    . CORONARY ARTERY BYPASS GRAFT    . SALPINGOOPHORECTOMY    . TONSILECTOMY/ADENOIDECTOMY WITH MYRINGOTOMY    . TUBAL LIGATION      Social History   Social History  . Marital status: Single  Spouse name: N/A  . Number of children: 2  . Years of education: N/A   Occupational History  . Not on file.   Social History Main Topics  . Smoking status: Former Smoker    Packs/day: 1.00    Years: 45.00    Types: Cigarettes    Quit date: 11/11/2003  . Smokeless tobacco: Never Used  . Alcohol use No     Comment: occasional  . Drug use: No  . Sexual activity: Not on file    Other Topics Concern  . Not on file   Social History Narrative  . No narrative on file    Family History  Problem Relation Age of Onset  . Cirrhosis Father        died age 59  . Alcohol abuse Father   . Asthma Mother   . Congestive Heart Failure Mother   . Breast cancer Mother        2 (1/2 sisters)  . Osteoarthritis Mother   . Colon cancer Mother   . Lupus Sister   . Alcohol abuse Sister   . Ovarian cancer Sister   . Osteoporosis Sister   . Skin cancer Sister   . Breast cancer Cousin   . Breast cancer Maternal Aunt      Current Outpatient Prescriptions:  .  albuterol (PROVENTIL HFA;VENTOLIN HFA) 108 (90 Base) MCG/ACT inhaler, Inhale 2 puffs into the lungs every 6 (six) hours as needed for wheezing or shortness of breath., Disp: 1 Inhaler, Rfl: 2 .  amLODipine (NORVASC) 5 MG tablet, TAKE ONE TABLET BY MOUTH EVERY DAY, Disp: 90 tablet, Rfl: 1 .  calcium carbonate (OSCAL) 1500 (600 Ca) MG TABS tablet, Take by mouth 2 (two) times daily with a meal., Disp: , Rfl:  .  cholecalciferol (VITAMIN D) 1000 units tablet, Take 1,000 Units by mouth daily., Disp: , Rfl:  .  fluticasone (FLONASE) 50 MCG/ACT nasal spray, Place 2 sprays into the nose daily., Disp: 16 g, Rfl: 5 .  ipratropium-albuterol (DUONEB) 0.5-2.5 (3) MG/3ML SOLN, Take 3 mLs by nebulization every 6 (six) hours as needed (for shortness of breath)., Disp: 36 mL, Rfl: 0 .  losartan (COZAAR) 100 MG tablet, Take 1 tablet (100 mg total) by mouth daily., Disp: 90 tablet, Rfl: 1 .  lovastatin (MEVACOR) 40 MG tablet, Take 1 tablet by mouth  daily, Disp: 90 tablet, Rfl: 3 .  metoprolol tartrate (LOPRESSOR) 25 MG tablet, TAKE ONE-HALF TABLET BY  MOUTH TWO TIMES DAILY, Disp: 90 tablet, Rfl: 1 .  SYMBICORT 160-4.5 MCG/ACT inhaler, USE 2 PUFFS TWO TIMES DAILY, Disp: 30.6 g, Rfl: 1 .  triamterene-hydrochlorothiazide (MAXZIDE-25) 37.5-25 MG tablet, Take 0.5 tablets by mouth daily., Disp: 30 tablet, Rfl: 2 .  aspirin EC 81 MG tablet,  Take 81 mg by mouth daily., Disp: , Rfl:   Physical exam:  Vitals:   10/31/16 1406  BP: (!) 172/71  Pulse: 65  Resp: 20  Temp: 98.5 F (36.9 C)  TempSrc: Tympanic  Weight: 132 lb 14.4 oz (60.3 kg)   Physical Exam  Musculoskeletal: She exhibits edema (trace).     CMP Latest Ref Rng & Units 09/28/2016  Glucose 65 - 99 mg/dL 129(H)  BUN 8 - 27 mg/dL 19  Creatinine 0.57 - 1.00 mg/dL 1.17(H)  Sodium 134 - 144 mmol/L 146(H)  Potassium 3.5 - 5.2 mmol/L 4.0  Chloride 96 - 106 mmol/L 100  CO2 18 - 29 mmol/L 26  Calcium 8.7 - 10.3 mg/dL 9.9  Total  Protein 6.0 - 8.3 g/dL -  Total Bilirubin 0.2 - 1.2 mg/dL -  Alkaline Phos 39 - 117 U/L -  AST 0 - 37 U/L -  ALT 0 - 35 U/L -   CBC Latest Ref Rng & Units 10/31/2016  WBC 3.6 - 11.0 K/uL 8.4  Hemoglobin 12.0 - 16.0 g/dL 16.0  Hematocrit 35.0 - 47.0 % 45.5  Platelets 150 - 440 K/uL 209     Assessment and plan- Patient is a 82 y.o. female who sees for following issues  1. Secondary polycythemia- likely from chronic lung disease. Her hct is 45.5 today. Hold off on phlebotomy. Repeat cbc in 3 and 6 months and she will see me in 6 months.   2. H/o BRCA positivity- repeat mammogram due in dec 2018  3. High risk screening for lung cancer- previously on low dose screening ct scans. Medicare did not approve. Recent CT pe done for SOB in feb 2018 was normal.  4. HTN- reports sbp in 140's at home. Repeat manual check was 168/68. F/u with pcp about this  rtc in 6 months with cbc   Visit Diagnosis 1. Polycythemia vera ([place])      Dr. Randa Evens, MD, MPH [facility] Pager- [phone] 10/31/2016 2:28 PM

## 2016-11-03 DIAGNOSIS — R0602 Shortness of breath: Secondary | ICD-10-CM | POA: Diagnosis not present

## 2016-11-03 DIAGNOSIS — Z955 Presence of coronary angioplasty implant and graft: Secondary | ICD-10-CM | POA: Diagnosis not present

## 2016-11-07 ENCOUNTER — Other Ambulatory Visit: Payer: Self-pay

## 2016-11-07 MED ORDER — AMLODIPINE BESYLATE 5 MG PO TABS: 5.0000 mg | ORAL_TABLET | Freq: Every day | ORAL | 1 refills | Status: DC

## 2016-11-08 DIAGNOSIS — J449 Chronic obstructive pulmonary disease, unspecified: Secondary | ICD-10-CM | POA: Diagnosis not present

## 2016-11-08 DIAGNOSIS — I10 Essential (primary) hypertension: Secondary | ICD-10-CM | POA: Diagnosis not present

## 2016-11-08 DIAGNOSIS — E7800 Pure hypercholesterolemia, unspecified: Secondary | ICD-10-CM | POA: Diagnosis not present

## 2016-11-08 DIAGNOSIS — R002 Palpitations: Secondary | ICD-10-CM | POA: Diagnosis not present

## 2016-11-08 DIAGNOSIS — Z955 Presence of coronary angioplasty implant and graft: Secondary | ICD-10-CM | POA: Diagnosis not present

## 2016-11-08 DIAGNOSIS — R0602 Shortness of breath: Secondary | ICD-10-CM | POA: Diagnosis not present

## 2016-12-20 DIAGNOSIS — R05 Cough: Secondary | ICD-10-CM | POA: Diagnosis not present

## 2016-12-20 DIAGNOSIS — J449 Chronic obstructive pulmonary disease, unspecified: Secondary | ICD-10-CM | POA: Diagnosis not present

## 2017-01-03 ENCOUNTER — Other Ambulatory Visit: Payer: Self-pay | Admitting: Internal Medicine

## 2017-01-04 ENCOUNTER — Other Ambulatory Visit: Payer: Self-pay

## 2017-01-04 MED ORDER — FLUTICASONE PROPIONATE 50 MCG/ACT NA SUSP: 2.0000 | Freq: Every day | NASAL | 5 refills | Status: DC

## 2017-01-30 ENCOUNTER — Inpatient Hospital Stay: Payer: Medicare Other | Attending: Oncology

## 2017-01-30 DIAGNOSIS — D45 Polycythemia vera: Secondary | ICD-10-CM | POA: Diagnosis not present

## 2017-01-30 DIAGNOSIS — D751 Secondary polycythemia: Secondary | ICD-10-CM

## 2017-01-30 LAB — COMPREHENSIVE METABOLIC PANEL
ALK PHOS: 59 U/L (ref 38–126)
ALT: 17 U/L (ref 14–54)
AST: 25 U/L (ref 15–41)
Albumin: 4.5 g/dL (ref 3.5–5.0)
Anion gap: 11 (ref 5–15)
BILIRUBIN TOTAL: 0.9 mg/dL (ref 0.3–1.2)
BUN: 31 mg/dL — AB (ref 6–20)
CALCIUM: 10 mg/dL (ref 8.9–10.3)
CHLORIDE: 100 mmol/L — AB (ref 101–111)
CO2: 25 mmol/L (ref 22–32)
CREATININE: 1.09 mg/dL — AB (ref 0.44–1.00)
GFR, EST AFRICAN AMERICAN: 54 mL/min — AB (ref >60)
GFR, EST NON AFRICAN AMERICAN: 47 mL/min — AB (ref >60)
Glucose, Bld: 125 mg/dL — ABNORMAL HIGH (ref 65–99)
Potassium: 3.7 mmol/L (ref 3.5–5.1)
Sodium: 136 mmol/L (ref 135–145)
Total Protein: 7.5 g/dL (ref 6.5–8.1)

## 2017-01-30 LAB — CBC WITH DIFFERENTIAL/PLATELET
BASOS ABS: 0.1 10*3/uL (ref 0–0.1)
BASOS PCT: 1 %
Eosinophils Absolute: 0.2 10*3/uL (ref 0–0.7)
Eosinophils Relative: 2 %
HEMATOCRIT: 44.1 % (ref 35.0–47.0)
HEMOGLOBIN: 15.6 g/dL (ref 12.0–16.0)
LYMPHS PCT: 15 %
Lymphs Abs: 1.7 10*3/uL (ref 1.0–3.6)
MCH: 31.6 pg (ref 26.0–34.0)
MCHC: 35.4 g/dL (ref 32.0–36.0)
MCV: 89.3 fL (ref 80.0–100.0)
MONO ABS: 0.9 10*3/uL (ref 0.2–0.9)
MONOS PCT: 8 %
NEUTROS PCT: 74 %
Neutro Abs: 8.8 10*3/uL — ABNORMAL HIGH (ref 1.4–6.5)
Platelets: 221 10*3/uL (ref 150–440)
RBC: 4.94 MIL/uL (ref 3.80–5.20)
RDW: 13.5 % (ref 11.5–14.5)
WBC: 11.7 10*3/uL — ABNORMAL HIGH (ref 3.6–11.0)

## 2017-02-02 ENCOUNTER — Other Ambulatory Visit: Payer: Self-pay | Admitting: Internal Medicine

## 2017-02-07 ENCOUNTER — Telehealth: Payer: Self-pay | Admitting: Internal Medicine

## 2017-02-07 NOTE — Telephone Encounter (Signed)
Pt called and stated that her sister passed away and she has been nausea. Pt thinks that she has a UTI. Please advise, thank you!  Call pt [phone]

## 2017-02-07 NOTE — Telephone Encounter (Signed)
Patient says that her sister passed away yesterday and she has been nauseous and unable to eat. She also says that she may have a UTI. She noticed some blood in urine yesterday. Scheduled patient with you tomorrow at 2:30 pm.

## 2017-02-08 ENCOUNTER — Ambulatory Visit (INDEPENDENT_AMBULATORY_CARE_PROVIDER_SITE_OTHER): Payer: Medicare Other | Admitting: Internal Medicine

## 2017-02-08 ENCOUNTER — Encounter: Payer: Self-pay | Admitting: Internal Medicine

## 2017-02-08 VITALS — BP 140/72 | HR 72 | Temp 98.6°F | Resp 12 | Wt 133.6 lb

## 2017-02-08 DIAGNOSIS — E7800 Pure hypercholesterolemia, unspecified: Secondary | ICD-10-CM | POA: Diagnosis not present

## 2017-02-08 DIAGNOSIS — D72829 Elevated white blood cell count, unspecified: Secondary | ICD-10-CM | POA: Diagnosis not present

## 2017-02-08 DIAGNOSIS — D45 Polycythemia vera: Secondary | ICD-10-CM | POA: Diagnosis not present

## 2017-02-08 DIAGNOSIS — R110 Nausea: Secondary | ICD-10-CM | POA: Diagnosis not present

## 2017-02-08 DIAGNOSIS — N183 Chronic kidney disease, stage 3 unspecified: Secondary | ICD-10-CM

## 2017-02-08 DIAGNOSIS — J449 Chronic obstructive pulmonary disease, unspecified: Secondary | ICD-10-CM

## 2017-02-08 DIAGNOSIS — R197 Diarrhea, unspecified: Secondary | ICD-10-CM | POA: Diagnosis not present

## 2017-02-08 DIAGNOSIS — R109 Unspecified abdominal pain: Secondary | ICD-10-CM

## 2017-02-08 DIAGNOSIS — R319 Hematuria, unspecified: Secondary | ICD-10-CM | POA: Insufficient documentation

## 2017-02-08 DIAGNOSIS — I10 Essential (primary) hypertension: Secondary | ICD-10-CM | POA: Diagnosis not present

## 2017-02-08 DIAGNOSIS — R739 Hyperglycemia, unspecified: Secondary | ICD-10-CM | POA: Diagnosis not present

## 2017-02-08 DIAGNOSIS — R638 Other symptoms and signs concerning food and fluid intake: Secondary | ICD-10-CM

## 2017-02-08 DIAGNOSIS — D751 Secondary polycythemia: Secondary | ICD-10-CM

## 2017-02-08 DIAGNOSIS — I2510 Atherosclerotic heart disease of native coronary artery without angina pectoris: Secondary | ICD-10-CM

## 2017-02-08 LAB — CBC WITH DIFFERENTIAL/PLATELET
BASOS ABS: 0 10*3/uL (ref 0.0–0.1)
BASOS PCT: 0.3 % (ref 0.0–3.0)
Eosinophils Absolute: 0.1 10*3/uL (ref 0.0–0.7)
Eosinophils Relative: 1 % (ref 0.0–5.0)
HCT: 47 % — ABNORMAL HIGH (ref 36.0–46.0)
Hemoglobin: 15.7 g/dL — ABNORMAL HIGH (ref 12.0–15.0)
LYMPHS ABS: 1.7 10*3/uL (ref 0.7–4.0)
Lymphocytes Relative: 11.7 % — ABNORMAL LOW (ref 12.0–46.0)
MCHC: 33.4 g/dL (ref 30.0–36.0)
MCV: 92.5 fl (ref 78.0–100.0)
MONO ABS: 1.4 10*3/uL — AB (ref 0.1–1.0)
Monocytes Relative: 9.4 % (ref 3.0–12.0)
NEUTROS ABS: 11.5 10*3/uL — AB (ref 1.4–7.7)
NEUTROS PCT: 77.6 % — AB (ref 43.0–77.0)
PLATELETS: 234 10*3/uL (ref 150.0–400.0)
RBC: 5.08 Mil/uL (ref 3.87–5.11)
RDW: 13.1 % (ref 11.5–15.5)
WBC: 14.9 10*3/uL — ABNORMAL HIGH (ref 4.0–10.5)

## 2017-02-08 LAB — HEPATIC FUNCTION PANEL
ALK PHOS: 56 U/L (ref 39–117)
ALT: 15 U/L (ref 0–35)
AST: 18 U/L (ref 0–37)
Albumin: 4.4 g/dL (ref 3.5–5.2)
BILIRUBIN DIRECT: 0.1 mg/dL (ref 0.0–0.3)
BILIRUBIN TOTAL: 0.7 mg/dL (ref 0.2–1.2)
Total Protein: 7 g/dL (ref 6.0–8.3)

## 2017-02-08 LAB — LIPASE: Lipase: 11 U/L (ref 11.0–59.0)

## 2017-02-08 LAB — URINALYSIS, ROUTINE W REFLEX MICROSCOPIC
BILIRUBIN URINE: NEGATIVE
KETONES UR: NEGATIVE
NITRITE: NEGATIVE
Urine Glucose: NEGATIVE
Urobilinogen, UA: 0.2 (ref 0.0–1.0)
pH: 5.5 (ref 5.0–8.0)

## 2017-02-08 LAB — TSH: TSH: 1.61 u[IU]/mL (ref 0.35–4.50)

## 2017-02-08 LAB — AMYLASE: Amylase: 36 U/L (ref 27–131)

## 2017-02-08 MED ORDER — PANTOPRAZOLE SODIUM 40 MG PO TBEC: 40.0000 mg | DELAYED_RELEASE_TABLET | Freq: Every day | ORAL | 1 refills | Status: DC

## 2017-02-08 MED ORDER — ONDANSETRON HCL 4 MG PO TABS: 4.0000 mg | ORAL_TABLET | Freq: Three times a day (TID) | ORAL | 0 refills | Status: DC | PRN

## 2017-02-08 NOTE — Progress Notes (Addendum)
[name], female   DOB: 1935/01/16, 82 y.o.   MRN: [id number]   Subjective:    [name], female    DOB: 1934/05/25, 82 y.o.   MRN: [id number]  HPI  Patient here as a work in with concerns regarding nausea and diarrhea and fatigue.  She is accompanied by her sister.  History obtained from both of them.  She has been under increased stress recently.  She has been taking care of her sister.  She has been in and out of the hospital, etc.  Just passed away this week.  Reports increased fatigue.  Breathing stable.  No chest pain.  When she tries to eat - nausea.  Occurs with any food.  Decreased po intake. Diarrhea.  Will have 3-5 episodes per day.  Noticed on one occasion black stool.  Did take pepto bismol on this day.  Some burning sensation and minimal discomfort.   No actual acid reflux.  No vomiting, just increased nausea.     Past Medical History:  Diagnosis Date  . Anemia   . Asthma   . BRCA positive   . CAD (coronary artery disease)   . COPD (chronic obstructive pulmonary disease) ([place])   . Emphysema lung ([place])   . GERD (gastroesophageal reflux disease)   . History of colon polyps   . Hypercholesterolemia   . Hyperglycemia   . Hypertension   . Lung nodules   . Osteoporosis   . Personal history of tobacco use, presenting hazards to health 11/25/2014  . Polycythemia vera(238.4)   . Renal cyst   . Skin cancer    Past Surgical History:  Procedure Laterality Date  . ANGIOPLASTY     coronary (x1)  . COLONOSCOPY WITH PROPOFOL N/A 03/02/2015   Procedure: COLONOSCOPY WITH PROPOFOL;  Surgeon: Lollie Sails, MD;  Location: [facility] ENDOSCOPY;  Service: Endoscopy;  Laterality: N/A;  . CORONARY ANGIOPLASTY    . CORONARY ARTERY BYPASS GRAFT    . SALPINGOOPHORECTOMY    . TONSILECTOMY/ADENOIDECTOMY WITH MYRINGOTOMY    . TUBAL LIGATION     Family History  Problem Relation Age of Onset  . Cirrhosis Father        died age 104  . Alcohol abuse Father   .  Asthma Mother   . Congestive Heart Failure Mother   . Breast cancer Mother        2 (1/2 sisters)  . Osteoarthritis Mother   . Colon cancer Mother   . Lupus Sister   . Alcohol abuse Sister   . Ovarian cancer Sister   . Osteoporosis Sister   . Skin cancer Sister   . Breast cancer Cousin   . Breast cancer Maternal Aunt    Social History   Social History  . Marital status: Single    Spouse name: N/A  . Number of children: 2  . Years of education: N/A   Social History Main Topics  . Smoking status: Former Smoker    Packs/day: 1.00    Years: 45.00    Types: Cigarettes    Quit date: 11/11/2003  . Smokeless tobacco: Never Used  . Alcohol use No     Comment: occasional  . Drug use: No  . Sexual activity: Not Asked   Other Topics Concern  . None   Social History Narrative  . None    Outpatient Encounter Prescriptions as of 02/08/2017  Medication Sig  . albuterol (PROVENTIL HFA;VENTOLIN HFA) 108 (90 Base) MCG/ACT  inhaler Inhale 2 puffs into the lungs every 6 (six) hours as needed for wheezing or shortness of breath.  Marland Kitchen amLODipine (NORVASC) 5 MG tablet TAKE 1 TABLET BY MOUTH  DAILY  . aspirin EC 81 MG tablet Take 81 mg by mouth daily.  . calcium carbonate (OSCAL) 1500 (600 Ca) MG TABS tablet Take by mouth 2 (two) times daily with a meal.  . cholecalciferol (VITAMIN D) 1000 units tablet Take 1,000 Units by mouth daily.  . fluticasone (FLONASE) 50 MCG/ACT nasal spray Place 2 sprays into both nostrils daily.  Marland Kitchen ipratropium-albuterol (DUONEB) 0.5-2.5 (3) MG/3ML SOLN Take 3 mLs by nebulization every 6 (six) hours as needed (for shortness of breath).  . losartan (COZAAR) 100 MG tablet TAKE 1 TABLET BY MOUTH  DAILY  . lovastatin (MEVACOR) 40 MG tablet TAKE 1 TABLET BY MOUTH  DAILY  . metoprolol tartrate (LOPRESSOR) 25 MG tablet TAKE ONE-HALF TABLET BY  MOUTH TWO TIMES DAILY  . SYMBICORT 160-4.5 MCG/ACT inhaler USE 2 PUFFS TWO TIMES DAILY  . triamterene-hydrochlorothiazide  (MAXZIDE-25) 37.5-25 MG tablet Take 0.5 tablets by mouth daily.  . ondansetron (ZOFRAN) 4 MG tablet Take 1 tablet (4 mg total) by mouth every 8 (eight) hours as needed for nausea or vomiting.  . pantoprazole (PROTONIX) 40 MG tablet Take 1 tablet (40 mg total) by mouth daily.   No facility-administered encounter medications on file as of 02/08/2017.     Review of Systems  Constitutional: Positive for appetite change and fatigue. Negative for unexpected weight change.  HENT: Negative for congestion and sinus pressure.   Respiratory: Negative for cough and chest tightness.        Breathing stable.   Cardiovascular: Negative for chest pain, palpitations and leg swelling.  Gastrointestinal: Positive for diarrhea and nausea. Negative for abdominal pain and vomiting.  Genitourinary: Negative for difficulty urinating and dysuria.  Musculoskeletal: Negative for joint swelling and myalgias.  Skin: Negative for color change and rash.  Neurological: Negative for dizziness, light-headedness and headaches.  Psychiatric/Behavioral: Negative for agitation.       Increased stress as outlined.         Objective:    Physical Exam  Constitutional: She appears well-developed and well-nourished. No distress.  HENT:  Nose: Nose normal.  Mouth/Throat: Oropharynx is clear and moist.  Neck: Neck supple. No thyromegaly present.  Cardiovascular: Normal rate and regular rhythm.   Pulmonary/Chest: Breath sounds normal. No respiratory distress. She has no wheezes.  Abdominal: Soft. Bowel sounds are normal. There is no tenderness.  Musculoskeletal: She exhibits no edema or tenderness.  Lymphadenopathy:    She has no cervical adenopathy.  Skin: No rash noted. No erythema.  Psychiatric: She has a normal mood and affect. Her behavior is normal.    BP 140/72 (BP Location: Left Arm, Patient Position: Sitting, Cuff Size: Normal)   Pulse 72   Temp 98.6 F (37 C) (Oral)   Resp 12   Wt 133 lb 9.6 oz (60.6 kg)    SpO2 95%   BMI 22.23 kg/m  Wt Readings from Last 3 Encounters:  02/08/17 133 lb 9.6 oz (60.6 kg)  10/31/16 132 lb 14.4 oz (60.3 kg)  10/19/16 132 lb 12.8 oz (60.2 kg)     Lab Results  Component Value Date   WBC 14.9 (H) 02/08/2017   HGB 15.7 (H) 02/08/2017   HCT 47.0 (H) 02/08/2017   PLT 234.0 02/08/2017   GLUCOSE 152 (H) 02/09/2017   CHOL 158 02/16/2016   TRIG 86.0  02/16/2016   HDL 60.10 02/16/2016   LDLCALC 81 02/16/2016   ALT 15 02/08/2017   AST 18 02/08/2017   NA 137 02/09/2017   K 2.9 (L) 02/09/2017   CL 95 (L) 02/09/2017   CREATININE 1.23 (H) 02/09/2017   BUN 21 02/09/2017   CO2 27 02/09/2017   TSH 1.61 02/08/2017   HGBA1C 5.7 (H) 06/14/2016    Dg Chest 2 View  Result Date: 08/21/2016 CLINICAL DATA:  Increasing shortness of breath with nonproductive cough EXAM: CHEST  2 VIEW COMPARISON:  06/14/2016 FINDINGS: Cardiac shadow is stable. Aortic calcifications are again seen. Hyperinflation of the lungs is again noted consistent with COPD. Mild increased basilar density is noted on the right consistent with atelectasis. No focal confluent infiltrate is noted. No acute bony abnormality is noted. IMPRESSION: Mild right basilar atelectasis. Electronically Signed   By: Inez Catalina M.D.   On: 08/21/2016 18:27       Assessment & Plan:   Problem List Items Addressed This Visit    CKD (chronic kidney disease), stage III    Recheck kidney function given persistent nausea and diarrhea.  Avoid antiinflammatories.        COPD (chronic obstructive pulmonary disease) (HCC)    Breathing stable.        Diarrhea    Persistent diarrhea and nausea.  Described the black stool on one occasion.  Heme negative on exam.  Took pepto bismo that day.  Check labs as outlined.  Bland foods. zofran for nausea.  Check CT as outlined.  Also check stool - GI pathogen panel.        Relevant Orders   Gastrointestinal Pathogen Panel PCR   CT Abdomen Pelvis Wo Contrast   Essential hypertension      Blood pressure slightly elevated today.  Have her spot check her pressure.        Hematuria    Check urinalysis.        Relevant Orders   Urinalysis, Routine w reflex microscopic (Completed)   Urine Culture (Completed)   Hypercholesterolemia    On lovastatin.  Follow lipid panel.       Hyperglycemia    Follow met b and a1c.       Leukocytosis    Recheck cbc.        Relevant Orders   CT Abdomen Pelvis Wo Contrast   Nausea    Persistent nausea as outlined.  Occurs with eating (any time any food).  No vomiting.  Diarrhea as outlined.  Persistent.  Obtain labs including cbc, liver panel, amylase and lipase.  Also obtain CT scan abdomen and pelvis.  Start protonix        Relevant Orders   Amylase (Completed)   Lipase (Completed)   CT Abdomen Pelvis Wo Contrast   Polycythemia, secondary    Followed by hematology.         Other Visit Diagnoses    Decreased oral intake    -  Primary   Relevant Orders   CT Abdomen Pelvis Wo Contrast   Polycythemia vera (HCC)   (Chronic)     Relevant Medications   ondansetron (ZOFRAN) 4 MG tablet   Abdominal pain, unspecified abdominal location       Having some burning sensation with the above symptoms.  Start protonix.  obtain CT scan.    Relevant Orders   CT Abdomen Pelvis Wo Contrast       Einar Pheasant, MD

## 2017-02-08 NOTE — Patient Instructions (Signed)
protonix 40mg  - take one tablet 30 minutes before breakfast  zofran as needed for nausea

## 2017-02-09 ENCOUNTER — Other Ambulatory Visit (INDEPENDENT_AMBULATORY_CARE_PROVIDER_SITE_OTHER): Payer: Medicare Other

## 2017-02-09 ENCOUNTER — Other Ambulatory Visit: Payer: Self-pay | Admitting: Internal Medicine

## 2017-02-09 DIAGNOSIS — I10 Essential (primary) hypertension: Secondary | ICD-10-CM

## 2017-02-09 LAB — BASIC METABOLIC PANEL
BUN: 21 mg/dL (ref 6–23)
CHLORIDE: 95 meq/L — AB (ref 96–112)
CO2: 27 meq/L (ref 19–32)
Calcium: 10.3 mg/dL (ref 8.4–10.5)
Creatinine, Ser: 1.23 mg/dL — ABNORMAL HIGH (ref 0.40–1.20)
GFR: 44.63 mL/min — ABNORMAL LOW (ref >60.00)
Glucose, Bld: 152 mg/dL — ABNORMAL HIGH (ref 70–99)
POTASSIUM: 2.9 meq/L — AB (ref 3.5–5.1)
SODIUM: 137 meq/L (ref 135–145)

## 2017-02-09 LAB — URINE CULTURE
MICRO NUMBER:: 81073325
SPECIMEN QUALITY:: ADEQUATE

## 2017-02-09 MED ORDER — POTASSIUM CHLORIDE ER 10 MEQ PO TBCR: EXTENDED_RELEASE_TABLET | ORAL | 0 refills | Status: DC

## 2017-02-09 NOTE — Progress Notes (Signed)
Order placed for met b 

## 2017-02-09 NOTE — Addendum Note (Signed)
Addended by: Nanci Pina on: 02/09/2017 04:46 PM   Modules accepted: Orders

## 2017-02-09 NOTE — Progress Notes (Signed)
Patient notified and script sent to pharmacy.

## 2017-02-10 ENCOUNTER — Other Ambulatory Visit: Payer: Self-pay | Admitting: Internal Medicine

## 2017-02-10 DIAGNOSIS — R7989 Other specified abnormal findings of blood chemistry: Secondary | ICD-10-CM

## 2017-02-10 DIAGNOSIS — E876 Hypokalemia: Secondary | ICD-10-CM

## 2017-02-10 NOTE — Progress Notes (Signed)
Order placed for f/u met b.  

## 2017-02-11 ENCOUNTER — Emergency Department: Payer: Medicare Other

## 2017-02-11 ENCOUNTER — Encounter: Payer: Self-pay | Admitting: Radiology

## 2017-02-11 ENCOUNTER — Inpatient Hospital Stay
Admission: EM | Admit: 2017-02-11 | Discharge: 2017-02-15 | DRG: 356 | Disposition: A | Discharge status: Home Health | Payer: Medicare Other | Attending: Internal Medicine | Admitting: Internal Medicine

## 2017-02-11 ENCOUNTER — Encounter: Payer: Self-pay | Admitting: Internal Medicine

## 2017-02-11 DIAGNOSIS — K639 Disease of intestine, unspecified: Secondary | ICD-10-CM | POA: Diagnosis present

## 2017-02-11 DIAGNOSIS — R109 Unspecified abdominal pain: Secondary | ICD-10-CM | POA: Diagnosis present

## 2017-02-11 DIAGNOSIS — Z8249 Family history of ischemic heart disease and other diseases of the circulatory system: Secondary | ICD-10-CM | POA: Diagnosis not present

## 2017-02-11 DIAGNOSIS — J449 Chronic obstructive pulmonary disease, unspecified: Secondary | ICD-10-CM | POA: Diagnosis present

## 2017-02-11 DIAGNOSIS — L97509 Non-pressure chronic ulcer of other part of unspecified foot with unspecified severity: Secondary | ICD-10-CM | POA: Diagnosis not present

## 2017-02-11 DIAGNOSIS — J9811 Atelectasis: Secondary | ICD-10-CM | POA: Diagnosis not present

## 2017-02-11 DIAGNOSIS — I2510 Atherosclerotic heart disease of native coronary artery without angina pectoris: Secondary | ICD-10-CM | POA: Diagnosis not present

## 2017-02-11 DIAGNOSIS — C679 Malignant neoplasm of bladder, unspecified: Secondary | ICD-10-CM | POA: Diagnosis not present

## 2017-02-11 DIAGNOSIS — R112 Nausea with vomiting, unspecified: Secondary | ICD-10-CM | POA: Diagnosis not present

## 2017-02-11 DIAGNOSIS — K529 Noninfective gastroenteritis and colitis, unspecified: Secondary | ICD-10-CM

## 2017-02-11 DIAGNOSIS — Z8041 Family history of malignant neoplasm of ovary: Secondary | ICD-10-CM | POA: Diagnosis not present

## 2017-02-11 DIAGNOSIS — R110 Nausea: Secondary | ICD-10-CM

## 2017-02-11 DIAGNOSIS — N329 Bladder disorder, unspecified: Secondary | ICD-10-CM | POA: Diagnosis not present

## 2017-02-11 DIAGNOSIS — K219 Gastro-esophageal reflux disease without esophagitis: Secondary | ICD-10-CM | POA: Diagnosis present

## 2017-02-11 DIAGNOSIS — N135 Crossing vessel and stricture of ureter without hydronephrosis: Secondary | ICD-10-CM | POA: Diagnosis present

## 2017-02-11 DIAGNOSIS — D494 Neoplasm of unspecified behavior of bladder: Secondary | ICD-10-CM | POA: Diagnosis present

## 2017-02-11 DIAGNOSIS — K5730 Diverticulosis of large intestine without perforation or abscess without bleeding: Secondary | ICD-10-CM | POA: Diagnosis not present

## 2017-02-11 DIAGNOSIS — N3289 Other specified disorders of bladder: Secondary | ICD-10-CM | POA: Diagnosis not present

## 2017-02-11 DIAGNOSIS — Z7951 Long term (current) use of inhaled steroids: Secondary | ICD-10-CM

## 2017-02-11 DIAGNOSIS — Z8679 Personal history of other diseases of the circulatory system: Secondary | ICD-10-CM | POA: Diagnosis not present

## 2017-02-11 DIAGNOSIS — K259 Gastric ulcer, unspecified as acute or chronic, without hemorrhage or perforation: Secondary | ICD-10-CM | POA: Diagnosis not present

## 2017-02-11 DIAGNOSIS — K5790 Diverticulosis of intestine, part unspecified, without perforation or abscess without bleeding: Secondary | ICD-10-CM | POA: Diagnosis not present

## 2017-02-11 DIAGNOSIS — E876 Hypokalemia: Secondary | ICD-10-CM | POA: Diagnosis present

## 2017-02-11 DIAGNOSIS — I771 Stricture of artery: Secondary | ICD-10-CM | POA: Diagnosis present

## 2017-02-11 DIAGNOSIS — Z66 Do not resuscitate: Secondary | ICD-10-CM | POA: Diagnosis present

## 2017-02-11 DIAGNOSIS — N179 Acute kidney failure, unspecified: Secondary | ICD-10-CM | POA: Diagnosis present

## 2017-02-11 DIAGNOSIS — Z951 Presence of aortocoronary bypass graft: Secondary | ICD-10-CM

## 2017-02-11 DIAGNOSIS — E7800 Pure hypercholesterolemia, unspecified: Secondary | ICD-10-CM | POA: Diagnosis present

## 2017-02-11 DIAGNOSIS — K633 Ulcer of intestine: Secondary | ICD-10-CM

## 2017-02-11 DIAGNOSIS — K551 Chronic vascular disorders of intestine: Secondary | ICD-10-CM | POA: Diagnosis not present

## 2017-02-11 DIAGNOSIS — K559 Vascular disorder of intestine, unspecified: Secondary | ICD-10-CM | POA: Diagnosis not present

## 2017-02-11 DIAGNOSIS — K254 Chronic or unspecified gastric ulcer with hemorrhage: Secondary | ICD-10-CM | POA: Diagnosis present

## 2017-02-11 DIAGNOSIS — C182 Malignant neoplasm of ascending colon: Secondary | ICD-10-CM | POA: Diagnosis present

## 2017-02-11 DIAGNOSIS — R933 Abnormal findings on diagnostic imaging of other parts of digestive tract: Secondary | ICD-10-CM | POA: Diagnosis not present

## 2017-02-11 DIAGNOSIS — K449 Diaphragmatic hernia without obstruction or gangrene: Secondary | ICD-10-CM | POA: Diagnosis not present

## 2017-02-11 DIAGNOSIS — K599 Functional intestinal disorder, unspecified: Secondary | ICD-10-CM | POA: Diagnosis not present

## 2017-02-11 DIAGNOSIS — Z9861 Coronary angioplasty status: Secondary | ICD-10-CM

## 2017-02-11 DIAGNOSIS — R1013 Epigastric pain: Secondary | ICD-10-CM | POA: Diagnosis not present

## 2017-02-11 DIAGNOSIS — D649 Anemia, unspecified: Secondary | ICD-10-CM | POA: Diagnosis not present

## 2017-02-11 DIAGNOSIS — Z800 Family history of malignant neoplasm of digestive organs: Secondary | ICD-10-CM

## 2017-02-11 DIAGNOSIS — R1084 Generalized abdominal pain: Secondary | ICD-10-CM | POA: Diagnosis not present

## 2017-02-11 DIAGNOSIS — K55059 Acute (reversible) ischemia of intestine, part and extent unspecified: Secondary | ICD-10-CM

## 2017-02-11 DIAGNOSIS — I10 Essential (primary) hypertension: Secondary | ICD-10-CM | POA: Diagnosis present

## 2017-02-11 DIAGNOSIS — Z87891 Personal history of nicotine dependence: Secondary | ICD-10-CM

## 2017-02-11 DIAGNOSIS — K2950 Unspecified chronic gastritis without bleeding: Secondary | ICD-10-CM | POA: Diagnosis not present

## 2017-02-11 DIAGNOSIS — M810 Age-related osteoporosis without current pathological fracture: Secondary | ICD-10-CM | POA: Diagnosis present

## 2017-02-11 LAB — GASTROINTESTINAL PANEL BY PCR, STOOL (REPLACES STOOL CULTURE)
ADENOVIRUS F40/41: NOT DETECTED
ASTROVIRUS: NOT DETECTED
CAMPYLOBACTER SPECIES: NOT DETECTED
CYCLOSPORA CAYETANENSIS: NOT DETECTED
Cryptosporidium: NOT DETECTED
ENTEROPATHOGENIC E COLI (EPEC): NOT DETECTED
ENTEROTOXIGENIC E COLI (ETEC): NOT DETECTED
Entamoeba histolytica: NOT DETECTED
Enteroaggregative E coli (EAEC): NOT DETECTED
Giardia lamblia: NOT DETECTED
Norovirus GI/GII: NOT DETECTED
PLESIMONAS SHIGELLOIDES: NOT DETECTED
ROTAVIRUS A: NOT DETECTED
SAPOVIRUS (I, II, IV, AND V): NOT DETECTED
SHIGA LIKE TOXIN PRODUCING E COLI (STEC): NOT DETECTED
Salmonella species: NOT DETECTED
Shigella/Enteroinvasive E coli (EIEC): NOT DETECTED
VIBRIO SPECIES: NOT DETECTED
Vibrio cholerae: NOT DETECTED
Yersinia enterocolitica: NOT DETECTED

## 2017-02-11 LAB — URINALYSIS, COMPLETE (UACMP) WITH MICROSCOPIC
BILIRUBIN URINE: NEGATIVE
Bacteria, UA: NONE SEEN
GLUCOSE, UA: NEGATIVE mg/dL
KETONES UR: NEGATIVE mg/dL
NITRITE: NEGATIVE
Protein, ur: 30 mg/dL — AB
Specific Gravity, Urine: 1.035 — ABNORMAL HIGH (ref 1.005–1.030)
pH: 6 (ref 5.0–8.0)

## 2017-02-11 LAB — CBC
HCT: 45.2 % (ref 35.0–47.0)
HEMOGLOBIN: 15.7 g/dL (ref 12.0–16.0)
MCH: 31 pg (ref 26.0–34.0)
MCHC: 34.8 g/dL (ref 32.0–36.0)
MCV: 89 fL (ref 80.0–100.0)
Platelets: 256 10*3/uL (ref 150–440)
RBC: 5.07 MIL/uL (ref 3.80–5.20)
RDW: 13.4 % (ref 11.5–14.5)
WBC: 26.2 10*3/uL — AB (ref 3.6–11.0)

## 2017-02-11 LAB — COMPREHENSIVE METABOLIC PANEL
ALT: 21 U/L (ref 14–54)
ANION GAP: 13 (ref 5–15)
AST: 33 U/L (ref 15–41)
Albumin: 4 g/dL (ref 3.5–5.0)
Alkaline Phosphatase: 63 U/L (ref 38–126)
BUN: 22 mg/dL — ABNORMAL HIGH (ref 6–20)
CALCIUM: 9.4 mg/dL (ref 8.9–10.3)
CO2: 27 mmol/L (ref 22–32)
Chloride: 96 mmol/L — ABNORMAL LOW (ref 101–111)
Creatinine, Ser: 1.18 mg/dL — ABNORMAL HIGH (ref 0.44–1.00)
GFR, EST AFRICAN AMERICAN: 49 mL/min — AB (ref >60)
GFR, EST NON AFRICAN AMERICAN: 42 mL/min — AB (ref >60)
GLUCOSE: 162 mg/dL — AB (ref 65–99)
Potassium: 2.9 mmol/L — ABNORMAL LOW (ref 3.5–5.1)
SODIUM: 136 mmol/L (ref 135–145)
TOTAL PROTEIN: 7.5 g/dL (ref 6.5–8.1)
Total Bilirubin: 1.3 mg/dL — ABNORMAL HIGH (ref 0.3–1.2)

## 2017-02-11 LAB — C DIFFICILE QUICK SCREEN W PCR REFLEX
C DIFFICILE (CDIFF) INTERP: NOT DETECTED
C DIFFICILE (CDIFF) TOXIN: NEGATIVE
C DIFFICLE (CDIFF) ANTIGEN: NEGATIVE

## 2017-02-11 LAB — LACTIC ACID, PLASMA: LACTIC ACID, VENOUS: 1.9 mmol/L (ref 0.5–1.9)

## 2017-02-11 LAB — LIPASE, BLOOD: LIPASE: 20 U/L (ref 11–51)

## 2017-02-11 MED ORDER — LOSARTAN POTASSIUM 50 MG PO TABS
100.0000 mg | ORAL_TABLET | Freq: Every day | ORAL | Status: DC
  Administered 2017-02-14 – 2017-02-15 (×2): 100 mg via ORAL
  Filled 2017-02-11 (×2): qty 2

## 2017-02-11 MED ORDER — ACETAMINOPHEN 650 MG RE SUPP: 650.0000 mg | Freq: Four times a day (QID) | RECTAL | Status: DC | PRN

## 2017-02-11 MED ORDER — IPRATROPIUM-ALBUTEROL 0.5-2.5 (3) MG/3ML IN SOLN: 3.0000 mL | Freq: Four times a day (QID) | RESPIRATORY_TRACT | Status: DC | PRN

## 2017-02-11 MED ORDER — ENOXAPARIN SODIUM 40 MG/0.4ML ~~LOC~~ SOLN
40.0000 mg | SUBCUTANEOUS | Status: DC
  Administered 2017-02-11 – 2017-02-14 (×4): 40 mg via SUBCUTANEOUS
  Filled 2017-02-11 (×4): qty 0.4

## 2017-02-11 MED ORDER — PRAVASTATIN SODIUM 20 MG PO TABS
40.0000 mg | ORAL_TABLET | Freq: Every day | ORAL | Status: DC
  Administered 2017-02-12 – 2017-02-14 (×3): 40 mg via ORAL
  Filled 2017-02-11 (×3): qty 2

## 2017-02-11 MED ORDER — METOPROLOL TARTRATE 25 MG PO TABS
12.5000 mg | ORAL_TABLET | Freq: Two times a day (BID) | ORAL | Status: DC
  Administered 2017-02-11 – 2017-02-15 (×7): 12.5 mg via ORAL
  Filled 2017-02-11 (×5): qty 1

## 2017-02-11 MED ORDER — ACETAMINOPHEN 325 MG PO TABS: 650.0000 mg | ORAL_TABLET | Freq: Four times a day (QID) | ORAL | Status: DC | PRN

## 2017-02-11 MED ORDER — ONDANSETRON HCL 4 MG PO TABS: 4.0000 mg | ORAL_TABLET | Freq: Three times a day (TID) | ORAL | Status: DC | PRN

## 2017-02-11 MED ORDER — ALBUTEROL SULFATE (2.5 MG/3ML) 0.083% IN NEBU: 3.0000 mL | INHALATION_SOLUTION | Freq: Four times a day (QID) | RESPIRATORY_TRACT | Status: DC | PRN

## 2017-02-11 MED ORDER — AMLODIPINE BESYLATE 5 MG PO TABS
5.0000 mg | ORAL_TABLET | Freq: Every day | ORAL | Status: DC
  Administered 2017-02-14 – 2017-02-15 (×2): 5 mg via ORAL
  Filled 2017-02-11 (×2): qty 1

## 2017-02-11 MED ORDER — INFLUENZA VAC SPLIT HIGH-DOSE 0.5 ML IM SUSY
0.5000 mL | PREFILLED_SYRINGE | INTRAMUSCULAR | Status: DC
  Filled 2017-02-11: qty 0.5

## 2017-02-11 MED ORDER — POTASSIUM CHLORIDE IN NACL 40-0.9 MEQ/L-% IV SOLN
INTRAVENOUS | Status: DC
  Administered 2017-02-11: 22:00:00 100 mL/h via INTRAVENOUS
  Filled 2017-02-11 (×6): qty 1000

## 2017-02-11 MED ORDER — PANTOPRAZOLE SODIUM 40 MG PO TBEC: 40.0000 mg | DELAYED_RELEASE_TABLET | Freq: Every day | ORAL | Status: DC

## 2017-02-11 MED ORDER — IOPAMIDOL (ISOVUE-370) INJECTION 76%
75.0000 mL | Freq: Once | INTRAVENOUS | Status: AC | PRN
  Administered 2017-02-11: 75 mL via INTRAVENOUS

## 2017-02-11 MED ORDER — HYDRALAZINE HCL 20 MG/ML IJ SOLN: 10.0000 mg | Freq: Four times a day (QID) | INTRAMUSCULAR | Status: DC | PRN

## 2017-02-11 MED ORDER — SODIUM CHLORIDE 0.9 % IV BOLUS (SEPSIS)
1000.0000 mL | Freq: Once | INTRAVENOUS | Status: AC
  Administered 2017-02-11: 1000 mL via INTRAVENOUS

## 2017-02-11 MED ORDER — POLYETHYLENE GLYCOL 3350 17 G PO PACK: 17.0000 g | PACK | Freq: Every day | ORAL | Status: DC | PRN

## 2017-02-11 MED ORDER — AMPICILLIN-SULBACTAM SODIUM 3 (2-1) G IJ SOLR
3.0000 g | Freq: Two times a day (BID) | INTRAMUSCULAR | Status: DC
  Administered 2017-02-11 – 2017-02-12 (×2): 3 g via INTRAVENOUS
  Filled 2017-02-11 (×3): qty 3

## 2017-02-11 MED ORDER — VITAMIN D 1000 UNITS PO TABS
1000.0000 [IU] | ORAL_TABLET | Freq: Every day | ORAL | Status: DC
  Administered 2017-02-14 – 2017-02-15 (×2): 1000 [IU] via ORAL
  Filled 2017-02-11 (×2): qty 1

## 2017-02-11 NOTE — Assessment & Plan Note (Signed)
Blood pressure slightly elevated today.  Have her spot check her pressure.

## 2017-02-11 NOTE — ED Notes (Signed)
Pt reports that she has been having nausea and diarrhea x 2 weeks. She states that every time she eats or drinks she has pain in her epigastric area. She saw pcp last week and was put on potassium and told she needed an abdominal scan, which is scheduled. Pt states she can't really eat, and is losing weight because of it. Pt alert & oriented with NAD noted.

## 2017-02-11 NOTE — ED Provider Notes (Signed)
[facility] Emergency Department Provider Note   ____________________________________________   First MD Initiated Contact with Patient 02/11/17 [phone]     (approximate)  I have reviewed the triage vital signs and the nursing notes.   HISTORY  Chief Complaint Abdominal Pain   HPI ROBERT SUNGA is a 82 y.o. female Who reports her sister died recently after being in the hospital for about a week. She says she was going to the cafeteria to eat and then began getting nauseated after she ate the cafeteria lately she's been nauseated after she eats anything she gets abdominal pain and discomfort deep and achy starts in the epigastric area and as I understand it seems to generalize he can eat something in the morning and be in pain for hours. Sitting was drinking. She has lost about 6 pounds in the last few days. She's been having very soft stools very frequently for the last few days as well. He has not had any fever not been vomiting but still nauseated after she eats loose stools after she eats anything as well.       Past Medical History:  Diagnosis Date  . Anemia   . Asthma   . BRCA positive   . CAD (coronary artery disease)   . COPD (chronic obstructive pulmonary disease) ([place])   . Emphysema lung ([place])   . GERD (gastroesophageal reflux disease)   . History of colon polyps   . Hypercholesterolemia   . Hyperglycemia   . Hypertension   . Lung nodules   . Osteoporosis   . Personal history of tobacco use, presenting hazards to health 11/25/2014  . Polycythemia vera(238.4)   . Renal cyst   . Skin cancer     Patient Active Problem List   Diagnosis Date Noted  . Nausea 02/08/2017  . Diarrhea 02/08/2017  . Hematuria 02/08/2017  . Acute bronchitis 06/14/2016  . Elevated troponin 06/14/2016  . CKD (chronic kidney disease), stage III 06/14/2016  . Leukocytosis 06/14/2016  . Abdominal bruit 04/16/2016  . Abnormal mammogram 06/06/2015  . Essential  hypertension 02/16/2015  . Numbness 01/24/2015  . Personal history of tobacco use, presenting hazards to health 11/25/2014  . BRCA positive   . COPD exacerbation ([place]) 11/14/2014  . Sensation of pressure in bladder area 08/15/2014  . Health care maintenance 08/15/2014  . Awareness of heartbeats 09/30/2013  . Presence of coronary angioplasty implant and graft 09/30/2013  . Acute confusional state 08/10/2013  . History of colonic polyps 08/25/2012  . Female genuine stress incontinence 06/28/2012  . Infection of urinary tract 06/28/2012  . CAD (coronary artery disease) 05/23/2012  . COPD (chronic obstructive pulmonary disease) ([place]) 05/23/2012  . Hypertension 05/23/2012  . Hypercholesterolemia 05/23/2012  . Hyperglycemia 05/23/2012  . Polycythemia, secondary 05/23/2012  . Renal cyst 05/23/2012  . Osteoporosis 05/23/2012    Past Surgical History:  Procedure Laterality Date  . ANGIOPLASTY     coronary (x1)  . COLONOSCOPY WITH PROPOFOL N/A 03/02/2015   Procedure: COLONOSCOPY WITH PROPOFOL;  Surgeon: Lollie Sails, MD;  Location: [facility] ENDOSCOPY;  Service: Endoscopy;  Laterality: N/A;  . CORONARY ANGIOPLASTY    . CORONARY ARTERY BYPASS GRAFT    . SALPINGOOPHORECTOMY    . TONSILECTOMY/ADENOIDECTOMY WITH MYRINGOTOMY    . TUBAL LIGATION      Prior to Admission medications   Medication Sig Start Date End Date Taking? Authorizing Provider  albuterol (PROVENTIL HFA;VENTOLIN HFA) 108 (90 Base) MCG/ACT inhaler Inhale 2 puffs into the  lungs every 6 (six) hours as needed for wheezing or shortness of breath. 08/23/16   Gladstone Lighter, MD  amLODipine (NORVASC) 5 MG tablet TAKE 1 TABLET BY MOUTH  DAILY 02/02/17   Einar Pheasant, MD  aspirin EC 81 MG tablet Take 81 mg by mouth daily.    [provider]  calcium carbonate (OSCAL) 1500 (600 Ca) MG TABS tablet Take by mouth 2 (two) times daily with a meal.    [provider]  cholecalciferol (VITAMIN D) 1000 units tablet  Take 1,000 Units by mouth daily.    [provider]  fluticasone (FLONASE) 50 MCG/ACT nasal spray Place 2 sprays into both nostrils daily. 01/04/17   Einar Pheasant, MD  ipratropium-albuterol (DUONEB) 0.5-2.5 (3) MG/3ML SOLN Take 3 mLs by nebulization every 6 (six) hours as needed (for shortness of breath). 11/14/14   Joanne Gavel, MD  losartan (COZAAR) 100 MG tablet TAKE 1 TABLET BY MOUTH  DAILY 01/04/17   Einar Pheasant, MD  lovastatin (MEVACOR) 40 MG tablet TAKE 1 TABLET BY MOUTH  DAILY 01/04/17   Einar Pheasant, MD  metoprolol tartrate (LOPRESSOR) 25 MG tablet TAKE ONE-HALF TABLET BY  MOUTH TWO TIMES DAILY 01/04/17   Einar Pheasant, MD  ondansetron (ZOFRAN) 4 MG tablet Take 1 tablet (4 mg total) by mouth every 8 (eight) hours as needed for nausea or vomiting. 02/08/17   Einar Pheasant, MD  pantoprazole (PROTONIX) 40 MG tablet Take 1 tablet (40 mg total) by mouth daily. 02/08/17   Einar Pheasant, MD  potassium chloride (K-DUR) 10 MEQ tablet Take 1 tablet twice a day for two days , then take 1 tablet daily then on. 02/09/17   Einar Pheasant, MD  SYMBICORT 160-4.5 MCG/ACT inhaler USE 2 PUFFS TWO TIMES DAILY 02/02/17   Einar Pheasant, MD  triamterene-hydrochlorothiazide (MAXZIDE-25) 37.5-25 MG tablet Take 0.5 tablets by mouth daily. 08/23/16   Gladstone Lighter, MD    Allergies Evista [raloxifene] and Fluticasone-salmeterol  Family History  Problem Relation Age of Onset  . Cirrhosis Father        died age 62  . Alcohol abuse Father   . Asthma Mother   . Congestive Heart Failure Mother   . Breast cancer Mother        2 (1/2 sisters)  . Osteoarthritis Mother   . Colon cancer Mother   . Lupus Sister   . Alcohol abuse Sister   . Ovarian cancer Sister   . Osteoporosis Sister   . Skin cancer Sister   . Breast cancer Cousin   . Breast cancer Maternal Aunt     Social History Social History  Substance Use Topics  . Smoking status: Former Smoker    Packs/day: 1.00    Years:  45.00    Types: Cigarettes    Quit date: 11/11/2003  . Smokeless tobacco: Never Used  . Alcohol use No     Comment: occasional    Review of Systems  Constitutional: No fever/chills Eyes: No visual changes. ENT: No sore throat. Cardiovascular: Denies chest pain. Respiratory: Denies shortness of breath. Gastrointestinal: see history of present illness Genitourinary: Negative for dysuria. Musculoskeletal: Negative for back pain. Skin: Negative for rash. Neurological: Negative for headaches, focal weakness or numbness.   ____________________________________________   PHYSICAL EXAM:  VITAL SIGNS: ED Triage Vitals  Enc Vitals Group     BP 02/11/17 1441 (!) 167/100     Pulse Rate 02/11/17 1441 80     Resp 02/11/17 1441 16  Temp 02/11/17 1441 98 F (36.7 C)     Temp Source 02/11/17 1441 Oral     SpO2 02/11/17 1441 100 %     Weight --      Height --      Head Circumference --      Peak Flow --      Pain Score 02/11/17 1436 6     Pain Loc --      Pain Edu? --      Excl. in [place]? --     Constitutional: Alert and oriented. Well appearing and in no acute distress. Eyes: Conjunctivae are normal.  Head: Atraumatic. Nose: No congestion/rhinnorhea. Mouth/Throat: Mucous membranes are moist.  Oropharynx non-erythematous. Neck: No stridor.  Cardiovascular: Normal rate, regular rhythm. Grossly normal heart sounds.  Good peripheral circulation. Respiratory: Normal respiratory effort.  No retractions. Lungs CTAB. Gastrointestinal: Soft and nontender. No distention. No abdominal bruits. No CVA tenderness. Musculoskeletal: No lower extremity tenderness nor edema.  No joint effusions. Neurologic:  Normal speech and language. No gross focal neurologic deficits are appreciated. No gait instability. Skin:  Skin is warm, dry and intact. No rash noted. Psychiatric: Mood and affect are normal. Speech and behavior are normal.  ____________________________________________   LABS (all  labs ordered are listed, but only abnormal results are displayed)  Labs Reviewed  COMPREHENSIVE METABOLIC PANEL - Abnormal; Notable for the following:       Result Value   Potassium 2.9 (*)    Chloride 96 (*)    Glucose, Bld 162 (*)    BUN 22 (*)    Creatinine, Ser 1.18 (*)    Total Bilirubin 1.3 (*)    GFR calc non Af Amer 42 (*)    GFR calc Af Amer 49 (*)    All other components within normal limits  CBC - Abnormal; Notable for the following:    WBC 26.2 (*)    All other components within normal limits  URINALYSIS, COMPLETE (UACMP) WITH MICROSCOPIC - Abnormal; Notable for the following:    Color, Urine YELLOW (*)    APPearance CLEAR (*)    Specific Gravity, Urine 1.035 (*)    Hgb urine dipstick MODERATE (*)    Protein, ur 30 (*)    Leukocytes, UA TRACE (*)    Squamous Epithelial / LPF 0-5 (*)    Non Squamous Epithelial 0-5 (*)    All other components within normal limits  GASTROINTESTINAL PANEL BY PCR, STOOL (REPLACES STOOL CULTURE)  C DIFFICILE QUICK SCREEN W PCR REFLEX  LIPASE, BLOOD  LACTIC ACID, PLASMA  TROPONIN I  CBC WITH DIFFERENTIAL/PLATELET  LACTIC ACID, PLASMA  LACTIC ACID, PLASMA   ____________________________________________  EKG   ____________________________________________  RADIOLOGY  radiology reports chest x-ray is essentially clear except for some basilar atelectasis  Belly CT is positive for multiple artery occlusion in the mesentery and a posterior bladder mass which may represent a cancer and a thickening of the colon wall which may represent a second cancer ____________________________________________   PROCEDURES  Procedure(s) performed:   Procedures  Critical Care performed:   ____________________________________________   INITIAL IMPRESSION / ASSESSMENT AND PLAN / ED COURSE  Pertinent labs & imaging results that were available during my care of the patient were reviewed by me and considered in my medical decision making (see  chart for details).    discussed with Dr. Lysbeth Galas who recommends hospitalist admission and stabilization he will operate on her tomorrow   ____________________________________________   FINAL CLINICAL IMPRESSION(S) /  ED DIAGNOSES  Final diagnoses:  Mesenteric ischemia due to arterial insufficiency (HCC)      NEW MEDICATIONS STARTED DURING THIS VISIT:  New Prescriptions   No medications on file     Note:  This document was prepared using Dragon voice recognition software and may include unintentional dictation errors.    Nena Polio, MD 02/11/17 [phone]

## 2017-02-11 NOTE — Assessment & Plan Note (Signed)
Follow met b and a1c.  

## 2017-02-11 NOTE — Assessment & Plan Note (Signed)
Persistent diarrhea and nausea.  Described the black stool on one occasion.  Heme negative on exam.  Took pepto bismo that day.  Check labs as outlined.  Bland foods. zofran for nausea.  Check CT as outlined.  Also check stool - GI pathogen panel.

## 2017-02-11 NOTE — Assessment & Plan Note (Signed)
Breathing stable.

## 2017-02-11 NOTE — Assessment & Plan Note (Signed)
Check urinalysis. 

## 2017-02-11 NOTE — Assessment & Plan Note (Signed)
Recheck cbc.  

## 2017-02-11 NOTE — Assessment & Plan Note (Signed)
Blood pressure slightly elevated today.  Have her spot check her pressure.  Hold on making changes in her medication.  Follow.  Check metabolic panel.

## 2017-02-11 NOTE — Progress Notes (Signed)
Pharmacy Antibiotic Note  Tina [name] is a 82 y.o. female admitted on 02/11/2017 with Intra-abdominal infection.  Pharmacy has been consulted for Unasyn dosing.  Plan: Will order Unasyn 3g IV q12h, dose is renally adjusted.    Temp (24hrs), Avg:98 F (36.7 C), Min:98 F (36.7 C), Max:98 F (36.7 C)   Recent Labs Lab 02/08/17 1520 02/09/17 0955 02/11/17 1516 02/11/17 1547  WBC 14.9*  --  26.2*  --   CREATININE  --  1.23* 1.18*  --   LATICACIDVEN  --   --   --  1.9    Estimated Creatinine Clearance: 34.2 mL/min (A) (by C-G formula based on SCr of 1.18 mg/dL (H)).    Allergies  Allergen Reactions  . Evista [Raloxifene] Other (See Comments)    Night sweats Night sweats Reaction:  Night sweats   . Fluticasone-Salmeterol Other (See Comments)    Reaction:  Cough     Antimicrobials this admission: Unaysn 9/30 >>   Thank you for allowing pharmacy to be a part of this patient's care.  Paulina Fusi, PharmD, BCPS 02/11/2017 6:19 PM

## 2017-02-11 NOTE — Assessment & Plan Note (Signed)
Followed by hematology 

## 2017-02-11 NOTE — ED Triage Notes (Signed)
Patient from home via POV with c/o Abd pain, N,V,D, and poor PO intake. Patient seen by Nicki Reaper MD and was scheduled for a "stomach scan" on Wednesday however, PCP noted a low K+ on Monday. Patient describes weakness and pain

## 2017-02-11 NOTE — Addendum Note (Signed)
Addended by: Alisa Graff on: 02/11/2017 12:55 PM   Modules accepted: Orders

## 2017-02-11 NOTE — ED Notes (Signed)
Pt transported to room 125

## 2017-02-11 NOTE — H&P (Signed)
Tina [name] at [place] NAME: Tina [name]    MR#:  [id number]  DATE OF BIRTH:  April 22, 1935  DATE OF ADMISSION:  02/11/2017  PRIMARY CARE PHYSICIAN: Einar Pheasant, MD   REQUESTING/REFERRING PHYSICIAN: Dr Cinda Quest  CHIEF COMPLAINT:  Nausea, fatigue, weight loss and weakness  HISTORY OF PRESENT ILLNESS:  Tina [name]  is a 82 y.o. female with a known history of asthma, COPD/emphysema, history of colon polyps with last colonoscopy 2016 showed sigmoid diverticulosis comes to the emergency room with increasing nausea and diarrhea for last week and a half. Patient reports feeling fatigued and malaise and loss of weight about 6 pounds and to 3 days. She is unable to eat much. She was seen by her primary care physician who ordered some basic labs and CT scan of the abdomen was ordered. Patient underwent CT of the abdomen which shows significant atherosclerotic occlusion of abdominal blood vessels and incidental note of posterior wall bladder mass and ascending colon the mass worrisome for possible colon malignancy. She is being admitted for further eval and management  PAST MEDICAL HISTORY:   Past Medical History:  Diagnosis Date  . Anemia   . Asthma   . BRCA positive   . CAD (coronary artery disease)   . COPD (chronic obstructive pulmonary disease) ([place])   . Emphysema lung ([place])   . GERD (gastroesophageal reflux disease)   . History of colon polyps   . Hypercholesterolemia   . Hyperglycemia   . Hypertension   . Lung nodules   . Osteoporosis   . Personal history of tobacco use, presenting hazards to health 11/25/2014  . Polycythemia vera(238.4)   . Renal cyst   . Skin cancer     PAST SURGICAL HISTOIRY:   Past Surgical History:  Procedure Laterality Date  . ANGIOPLASTY     coronary (x1)  . COLONOSCOPY WITH PROPOFOL N/A 03/02/2015   Procedure: COLONOSCOPY WITH PROPOFOL;  Surgeon: Lollie Sails, MD;  Location: [facility] ENDOSCOPY;   Service: Endoscopy;  Laterality: N/A;  . CORONARY ANGIOPLASTY    . CORONARY ARTERY BYPASS GRAFT    . SALPINGOOPHORECTOMY    . TONSILECTOMY/ADENOIDECTOMY WITH MYRINGOTOMY    . TUBAL LIGATION      SOCIAL HISTORY:   Social History  Substance Use Topics  . Smoking status: Former Smoker    Packs/day: 1.00    Years: 45.00    Types: Cigarettes    Quit date: 11/11/2003  . Smokeless tobacco: Never Used  . Alcohol use No     Comment: occasional    FAMILY HISTORY:   Family History  Problem Relation Age of Onset  . Cirrhosis Father        died age 37  . Alcohol abuse Father   . Asthma Mother   . Congestive Heart Failure Mother   . Breast cancer Mother        2 (1/2 sisters)  . Osteoarthritis Mother   . Colon cancer Mother   . Lupus Sister   . Alcohol abuse Sister   . Ovarian cancer Sister   . Osteoporosis Sister   . Skin cancer Sister   . Breast cancer Cousin   . Breast cancer Maternal Aunt     DRUG ALLERGIES:   Allergies  Allergen Reactions  . Evista [Raloxifene] Other (See Comments)    Night sweats Night sweats Reaction:  Night sweats   . Fluticasone-Salmeterol Other (See Comments)    Reaction:  Cough  REVIEW OF SYSTEMS:  Review of Systems  Constitutional: Positive for malaise/fatigue. Negative for chills, fever and weight loss.  HENT: Negative for ear discharge, ear pain and nosebleeds.   Eyes: Negative for blurred vision, pain and discharge.  Respiratory: Negative for sputum production, shortness of breath, wheezing and stridor.   Cardiovascular: Negative for chest pain, palpitations, orthopnea and PND.  Gastrointestinal: Positive for diarrhea and nausea. Negative for abdominal pain and vomiting.  Genitourinary: Negative for frequency and urgency.  Musculoskeletal: Negative for back pain and joint pain.  Neurological: Positive for weakness. Negative for sensory change, speech change and focal weakness.  Psychiatric/Behavioral: Negative for depression and  hallucinations. The patient is not nervous/anxious.      MEDICATIONS AT HOME:   Prior to Admission medications   Medication Sig Start Date End Date Taking? Authorizing Provider  amLODipine (NORVASC) 5 MG tablet TAKE 1 TABLET BY MOUTH  DAILY 02/02/17  Yes Einar Pheasant, MD  cholecalciferol (VITAMIN D) 1000 units tablet Take 1,000 Units by mouth daily.   Yes [provider]  losartan (COZAAR) 100 MG tablet TAKE 1 TABLET BY MOUTH  DAILY 01/04/17  Yes Einar Pheasant, MD  lovastatin (MEVACOR) 40 MG tablet TAKE 1 TABLET BY MOUTH  DAILY 01/04/17  Yes Einar Pheasant, MD  metoprolol tartrate (LOPRESSOR) 25 MG tablet TAKE ONE-HALF TABLET BY  MOUTH TWO TIMES DAILY 01/04/17  Yes Einar Pheasant, MD  ondansetron (ZOFRAN) 4 MG tablet Take 1 tablet (4 mg total) by mouth every 8 (eight) hours as needed for nausea or vomiting. 02/08/17  Yes Einar Pheasant, MD  pantoprazole (PROTONIX) 40 MG tablet Take 1 tablet (40 mg total) by mouth daily. 02/08/17  Yes Einar Pheasant, MD  potassium chloride (K-DUR) 10 MEQ tablet Take 1 tablet twice a day for two days , then take 1 tablet daily then on. 02/09/17  Yes Einar Pheasant, MD  SYMBICORT 160-4.5 MCG/ACT inhaler USE 2 PUFFS TWO TIMES DAILY 02/02/17  Yes Einar Pheasant, MD  triamterene-hydrochlorothiazide (MAXZIDE-25) 37.5-25 MG tablet Take 0.5 tablets by mouth daily. 08/23/16  Yes Gladstone Lighter, MD  albuterol (PROVENTIL HFA;VENTOLIN HFA) 108 (90 Base) MCG/ACT inhaler Inhale 2 puffs into the lungs every 6 (six) hours as needed for wheezing or shortness of breath. 08/23/16   Gladstone Lighter, MD  fluticasone (FLONASE) 50 MCG/ACT nasal spray Place 2 sprays into both nostrils daily. 01/04/17   Einar Pheasant, MD  ipratropium-albuterol (DUONEB) 0.5-2.5 (3) MG/3ML SOLN Take 3 mLs by nebulization every 6 (six) hours as needed (for shortness of breath). 11/14/14   Joanne Gavel, MD      VITAL SIGNS:  Blood pressure (!) 177/79, pulse 80, temperature 98 F (36.7  C), temperature source Oral, resp. rate 16, SpO2 100 %.  PHYSICAL EXAMINATION:  GENERAL:  82 y.o.-year-old patient lying in the bed with no acute distress.  EYES: Pupils equal, round, reactive to light and accommodation. No scleral icterus. Extraocular muscles intact.  HEENT: Head atraumatic, normocephalic. Oropharynx and nasopharynx clear.  NECK:  Supple, no jugular venous distention. No thyroid enlargement, no tenderness.  LUNGS: Normal breath sounds bilaterally, no wheezing, rales,rhonchi or crepitation. No use of accessory muscles of respiration.  CARDIOVASCULAR: S1, S2 normal. No murmurs, rubs, or gallops.  ABDOMEN: Soft, nontender, nondistended. Bowel sounds present. No organomegaly or mass.  EXTREMITIES: No pedal edema, cyanosis, or clubbing.  NEUROLOGIC: Cranial nerves II through XII are intact. Muscle strength 5/5 in all extremities. Sensation intact. Gait not checked.  PSYCHIATRIC: The patient is alert and oriented  x 3.  SKIN: No obvious rash, lesion, or ulcer.   LABORATORY PANEL:   CBC  Recent Labs Lab 02/11/17 1516  WBC 26.2*  HGB 15.7  HCT 45.2  PLT 256   ------------------------------------------------------------------------------------------------------------------  Chemistries   Recent Labs Lab 02/11/17 1516  NA 136  K 2.9*  CL 96*  CO2 27  GLUCOSE 162*  BUN 22*  CREATININE 1.18*  CALCIUM 9.4  AST 33  ALT 21  ALKPHOS 63  BILITOT 1.3*   ------------------------------------------------------------------------------------------------------------------  Cardiac Enzymes No results for input(s): TROPONINI in the last 168 hours. ------------------------------------------------------------------------------------------------------------------  RADIOLOGY:  Dg Chest Portable 1 View  Result Date: 02/11/2017 CLINICAL DATA:  Epigastric abdominal pain. EXAM: PORTABLE CHEST 1 VIEW COMPARISON:  Radiographs of August 21, 2016. FINDINGS: The heart size and  mediastinal contours are within normal limits. Atherosclerosis of thoracic aorta is noted. No pneumothorax or pleural effusion is noted. Right lung is clear. Mild left basilar subsegmental atelectasis is noted. The visualized skeletal structures are unremarkable. IMPRESSION: Mild left basilar subsegmental atelectasis.  Aortic atherosclerosis. Electronically Signed   By: Marijo Conception, M.D.   On: 02/11/2017 16:34   Ct Angio Abd/pel W/ And/or W/o  Result Date: 02/11/2017 CLINICAL DATA:  Nausea and abdominal pain after eating. Clinical concern for vascular insufficiency. EXAM: CTA ABDOMEN AND PELVIS WITHOUT AND WITH CONTRAST TECHNIQUE: Multidetector CT imaging of the abdomen and pelvis was performed using the standard protocol during bolus administration of intravenous contrast. Multiplanar reconstructed images and MIPs were obtained and reviewed to evaluate the vascular anatomy. CONTRAST:  75 cc Isovue 370 COMPARISON:  Chest, abdomen and pelvis CT dated 01/14/2009. FINDINGS: VASCULAR Aorta: Marked calcified and noncalcified plaque formation in the anterior aspect of the proximal abdominal aorta at the level of the celiac axis and superior mesenteric artery origins. Additional calcified plaque formation elsewhere in the aorta. No aortic aneurysm or dissection seen. 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 There is collateral reconstitution distal to the occlusion. SMA: Marked calcified and noncalcified plaque formation at the origin with complete or near complete occlusion of a 3 mm segment of the proximal superior mesenteric artery. Renals: Single right renal artery with plaque formation at the origin producing approximately 50% stenosis. One anterior and 1 posterior left renal artery with plaque formation at the origin of both. There is approximately 80% stenosis of the proximal portion of the anterior artery at its  origin. The posterior artery is completely occluded over a 3 mm long segment proximally. IMA: Calcified and noncalcified plaque formation at the origin with complete or near complete occlusion. Inflow: Mild bilateral iliac calcified plaque formation without significant stenosis on either side. Proximal Outflow: Normal appearing proximal common femoral, femoral and profunda femoral arteries. Veins: No venous thrombosis demonstrated. Review of the MIP images confirms the above findings. NON-VASCULAR Lower chest: Linear atelectasis or scarring at both lung bases. Hepatobiliary: No focal liver abnormality is seen. No gallstones, gallbladder wall thickening, or biliary dilatation. Pancreas: Unremarkable. No pancreatic ductal dilatation or surrounding inflammatory changes. Spleen: Normal in size without focal abnormality. Adrenals/Urinary Tract: Bilateral renal cysts. Mildly irregular and heterogeneous posterior bladder mass, measuring 4.7 x 3.0 cm on image number 168 of series 4. This measures 3.2 cm in length on sagittal image number 85 series seen. Unremarkable ureters. No urinary tract calculi or hydronephrosis. Mild bilateral adrenal hyperplasia. Stomach/Bowel: Small hiatal hernia. Mild sigmoid colon diverticulosis. Poorly distended ascending  colon with some irregular wall thickening. Unremarkable small bowel. Lymphatic: No enlarged lymph nodes. Reproductive: Uterus and bilateral adnexa are unremarkable. Other: No abdominal wall hernia or abnormality. No abdominopelvic ascites. Musculoskeletal: Approximately 20% L2 superior endplate compression deformity with Schmorl's node formation. No acute fracture lines or bony retropulsion. Mild lumbar and lower thoracic spine degenerative changes. IMPRESSION: VASCULAR 1. Marked densely calcified and noncalcified plaque formation in the anterior aspect of the proximal abdominal aorta at the origins of the celiac axis and superior mesenteric artery. 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 2. Plaque formation causing complete or near complete occlusion of the inferior mesenteric artery at its origin. 3. Plaque formation causing complete occlusion of a 3 mm long segment of the proximal portion of the posterior left renal artery and plaque formation causing approximately 80% stenosis of the proximal portion of the anterior left renal artery. 4. Plaque formation causing approximately 50% stenosis of the origin of the right renal artery. 5. Catheter angiography and stent placement as indicated at that time is recommended. NON-VASCULAR 1. **An incidental finding of potential clinical significance has been found. 4.7 x 3.2 x 3.0 cm posterior bladder mass. This is compatible with a primary bladder malignancy.** 2. **An incidental finding of potential clinical significance has been found. Irregular wall thickening involving the poorly distended ascending colon segment, suspicious for the possibility of colon cancer. Correlation with colonoscopy is recommended.** 3. Small hiatal hernia. 4. Mild sigmoid diverticulosis. Electronically Signed   By: Claudie Revering M.D.   On: 02/11/2017 17:34    EKG:    IMPRESSION AND PLAN:   Tina [name]  is a 82 y.o. female with a known history of asthma, COPD/emphysema, history of colon polyps with last colonoscopy 2016 showed sigmoid diverticulosis comes to the emergency room with increasing nausea and diarrhea for last week and a half. Patient reports feeling fatigued and malaise and loss of weight about 6 pounds and to 3 days. She is unable to eat much.  1. Nausea, diarrhea, weight loss with malaise and fatigue and abdominal CAT scan suggestive of atherosclerotic abdominal vessels with suggestion of possible ischemic colitis, irregular wall thickening involving the ascending colon suspicious for possibility of colon cancer  and incidental note of 4.7 x 3.2 cm posterior bladder wall mass -Admit to medical floor -Vascular surgery consultation -IV fluids -Empiric IV Unasyn  2. Incidental posterior bladder wall mass -Urology consultation  3. Irregular thickening of descending colon with symptoms of malaise fatigue diarrhea and nausea and weight loss -GI consult  4. Hypokalemia Suspected due to GI losses and due to diuretic -repeat IV and oral potassium  5.Leukocytosis suspected due to #1 -Empiric Unasyn  6. DVT prophylaxis subcutaneous Lovenox  CODE STATUS discussed with patient. Patient requests/wishes DO NOT RESUSCITATE  No family members in the emergency room. According to the patient son was Present Earlier However Has Left.  All the records are reviewed and case discussed with ED provider. Management plans discussed with the patient, family and they are in agreement.  CODE STATUS: DO NOT RESUSCITATE TOTAL TIME TAKING CARE OF THIS PATIENT:50 minutes.    , M.D on 02/11/2017 at 6:30 PM  Between 7am to 6pm - Pager - [phone]  After 6pm go to [email or web address] - password [email or web address] [facility]  [facility]  Office  [phone]  CC: Primary care physician; Einar Pheasant, MD

## 2017-02-11 NOTE — Assessment & Plan Note (Signed)
On lovastatin.  Follow lipid panel.

## 2017-02-11 NOTE — Assessment & Plan Note (Signed)
Recheck kidney function given persistent nausea and diarrhea.  Avoid antiinflammatories.

## 2017-02-11 NOTE — Assessment & Plan Note (Addendum)
Persistent nausea as outlined.  Occurs with eating (any time any food).  No vomiting.  Diarrhea as outlined.  Persistent.  Obtain labs including cbc, liver panel, amylase and lipase.  Also obtain CT scan abdomen and pelvis.  Start protonix

## 2017-02-11 NOTE — Consult Note (Addendum)
[facility] Vascular Consult Note  MRN : [id number]  Tina [name] is a 82 y.o. (1934/05/16) female who presents with chief complaint of  Chief Complaint  Patient presents with  . Abdominal Pain  .  History of Present Illness: I am asked to see the patient by Dr. 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 The patient complains 2-3 weeks of nausea and abdominal pain. The severe abdominal pain has been going on now for about a week. She has no appetite has had unintentional weight loss. She denies fever or chills. The pain is generalized throughout her abdomen and has been very severe. The nausea and anorexia have been associated symptoms. There is no clear causative factor for anything that started the symptoms. nothing is really making her symptoms better. As part of her workup, a CT scan of the abdomen and pelvis was performed which I have independently reviewed. 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 She also has some left renal artery stenosis and 1 of 2 left renal arteries. In addition, there are findings worrisome for a colon cancer and a bladder tumor as well.  Current Facility-Administered Medications  Medication Dose Route Frequency Provider Last Rate Last Dose  . Ampicillin-Sulbactam (UNASYN) 3 g in sodium chloride 0.9 % 100 mL IVPB  3 g Intravenous Q12H Nena Polio, MD   Stopped at 02/11/17 1929  . hydrALAZINE (APRESOLINE) injection 10 mg  10 mg Intravenous Q6H PRN Fritzi Mandes, MD       Current Outpatient Prescriptions  Medication Sig Dispense Refill  . amLODipine (NORVASC) 5 MG tablet TAKE 1 TABLET BY MOUTH  DAILY 90 tablet 1  . cholecalciferol (VITAMIN D) 1000 units tablet Take 1,000 Units by mouth daily.    Marland Kitchen losartan (COZAAR)  100 MG tablet TAKE 1 TABLET BY MOUTH  DAILY 90 tablet 1  . lovastatin (MEVACOR) 40 MG tablet TAKE 1 TABLET BY MOUTH  DAILY 90 tablet 1  . metoprolol tartrate (LOPRESSOR) 25 MG tablet TAKE ONE-HALF TABLET BY  MOUTH TWO TIMES DAILY 90 tablet 1  . ondansetron (ZOFRAN) 4 MG tablet Take 1 tablet (4 mg total) by mouth every 8 (eight) hours as needed for nausea or vomiting. 20 tablet 0  . pantoprazole (PROTONIX) 40 MG tablet Take 1 tablet (40 mg total) by mouth daily. 30 tablet 1  . potassium chloride (K-DUR) 10 MEQ tablet Take 1 tablet twice a day for two days , then take 1 tablet daily then on. 30 tablet 0  . SYMBICORT 160-4.5 MCG/ACT inhaler USE 2 PUFFS TWO TIMES DAILY 30.6 g 1  . triamterene-hydrochlorothiazide (MAXZIDE-25) 37.5-25 MG tablet Take 0.5 tablets by mouth daily. 30 tablet 2  . albuterol (PROVENTIL HFA;VENTOLIN HFA) 108 (90 Base) MCG/ACT inhaler Inhale 2 puffs into the lungs every 6 (six) hours as needed for wheezing or shortness of breath. 1 Inhaler 2  . fluticasone (FLONASE) 50 MCG/ACT nasal spray Place 2 sprays into both nostrils daily. 16 g 5  . ipratropium-albuterol (DUONEB) 0.5-2.5 (3) MG/3ML SOLN Take 3 mLs by nebulization every 6 (six) hours as needed (for shortness of breath). 36 mL 0    Past Medical History:  Diagnosis Date  . Anemia   . Asthma   . BRCA positive   . CAD (coronary artery disease)   .  COPD (chronic obstructive pulmonary disease) ([place])   . Emphysema lung ([place])   . GERD (gastroesophageal reflux disease)   . History of colon polyps   . Hypercholesterolemia   . Hyperglycemia   . Hypertension   . Lung nodules   . Osteoporosis   . Personal history of tobacco use, presenting hazards to health 11/25/2014  . Polycythemia vera(238.4)   . Renal cyst   . Skin cancer     Past Surgical History:  Procedure Laterality Date  . ANGIOPLASTY     coronary (x1)  . COLONOSCOPY WITH PROPOFOL N/A 03/02/2015   Procedure: COLONOSCOPY WITH PROPOFOL;  Surgeon: Lollie Sails, MD;  Location: [facility] ENDOSCOPY;  Service: Endoscopy;  Laterality: N/A;  . CORONARY ANGIOPLASTY    . CORONARY ARTERY BYPASS GRAFT    . SALPINGOOPHORECTOMY    . TONSILECTOMY/ADENOIDECTOMY WITH MYRINGOTOMY    . TUBAL LIGATION      Social History Social History  Substance Use Topics  . Smoking status: Former Smoker    Packs/day: 1.00    Years: 45.00    Types: Cigarettes    Quit date: 11/11/2003  . Smokeless tobacco: Never Used  . Alcohol use No     Comment: occasional  No IVDU  Family History Family History  Problem Relation Age of Onset  . Cirrhosis Father        died age 42  . Alcohol abuse Father   . Asthma Mother   . Congestive Heart Failure Mother   . Breast cancer Mother        2 (1/2 sisters)  . Osteoarthritis Mother   . Colon cancer Mother   . Lupus Sister   . Alcohol abuse Sister   . Ovarian cancer Sister   . Osteoporosis Sister   . Skin cancer Sister   . Breast cancer Cousin   . Breast cancer Maternal Aunt     Allergies  Allergen Reactions  . Evista [Raloxifene] Other (See Comments)    Night sweats Night sweats Reaction:  Night sweats   . Fluticasone-Salmeterol Other (See Comments)    Reaction:  Cough      REVIEW OF SYSTEMS (Negative unless checked)  Constitutional: _0 Weight loss  _1 Fever  _2 Chills Cardiac: _3 Chest pain   _4 Chest pressure   _5 Palpitations   _6 Shortness of breath when laying flat   _7 Shortness of breath at rest   _8 Shortness of breath with exertion. Vascular:  _9 Pain in legs with walking   _10 Pain in legs at rest   _11 Pain in legs when laying flat   _12 Claudication   _13 Pain in feet when walking  _14 Pain in feet at rest  _15 Pain in feet when laying flat   _16 History of DVT   _17 Phlebitis   _18 Swelling in legs   _19 Varicose veins   _20 Non-healing ulcers Pulmonary:   _21 Uses home oxygen   _22 Productive cough   _23 Hemoptysis   _24 Wheeze  _25 COPD   _26 Asthma Neurologic:  _27 Dizziness  _28 Blackouts   _29 Seizures   _30 History of stroke   _31 History of  TIA  _32 Aphasia   _33 Temporary blindness   _34 Dysphagia   _35 Weakness or numbness in arms   _36 Weakness or numbness in legs Musculoskeletal:  _37 Arthritis   _38 Joint swelling   _39 Joint pain   _40 Low back pain Hematologic:  _41 Easy bruising  _42 Easy bleeding   _43 Hypercoagulable state   _44 Anemic  _45 Hepatitis Gastrointestinal:  _46 Blood in stool   _47 Vomiting blood  _48 Gastroesophageal reflux/heartburn   _49 Difficulty swallowing. Positive for abdominal pain, nausea, and poor appetite Genitourinary:  _50   Chronic kidney disease   _0 Difficult urination  _1 Frequent urination  _2 Burning with urination   _3 Blood in urine Skin:  _4 Rashes   _5 Ulcers   _6 Wounds Psychological:  _7 History of anxiety   _8  History of major depression.  Physical Examination  Vitals:   02/11/17 1441 02/11/17 1513 02/11/17 1929  BP: (!) 167/100 (!) 177/79 (!) 158/59  Pulse: 80 80 81  Resp: 16  17  Temp: 98 F (36.7 C)    TempSrc: Oral    SpO2: 100%  95%   There is no height or weight on file to calculate BMI. Gen:  NAD, thin somewhat frail-appearing white female Head: [place]/AT, No temporalis wasting. Prominent temp pulse not noted. Ear/Nose/Throat: Hearing grossly intact, nares w/o erythema or drainage, oropharynx w/o Erythema/Exudate Eyes: Sclera non-icteric, conjunctiva clear Neck: Trachea midline.  No JVD.  Pulmonary:  Good air movement, respirations not labored, equal bilaterally.  Cardiac: RRR, normal S1, S2. Vascular:  Vessel Right Left  Radial Palpable Palpable                          PT Palpable Palpable  DP Palpable Palpable   Gastrointestinal: soft, diffusely tender. No rebound or guarding Musculoskeletal: M/S 5/5 throughout.  Extremities without ischemic changes.  No deformity or atrophy. No edema. Neurologic: Sensation grossly intact in extremities.  Symmetrical.  Speech is fluent. Motor exam as listed above. Psychiatric: Judgment intact, Mood & affect appropriate for pt's clinical situation. Dermatologic: No  rashes or ulcers noted.  No cellulitis or open wounds.       CBC Lab Results  Component Value Date   WBC 26.2 (H) 02/11/2017   HGB 15.7 02/11/2017   HCT 45.2 02/11/2017   MCV 89.0 02/11/2017   PLT 256 02/11/2017    BMET    Component Value Date/Time   NA 136 02/11/2017 1516   NA 146 (H) 09/28/2016 1231   K 2.9 (L) 02/11/2017 1516   CL 96 (L) 02/11/2017 1516   CO2 27 02/11/2017 1516   GLUCOSE 162 (H) 02/11/2017 1516   BUN 22 (H) 02/11/2017 1516   BUN 19 09/28/2016 1231   CREATININE 1.18 (H) 02/11/2017 1516   CALCIUM 9.4 02/11/2017 1516   GFRNONAA 42 (L) 02/11/2017 1516   GFRAA 49 (L) 02/11/2017 1516   Estimated Creatinine Clearance: 34.2 mL/min (A) (by C-G formula based on SCr of 1.18 mg/dL (H)).  COAG No results found for: INR, PROTIME  Radiology Dg Chest Portable 1 View  Result Date: 02/11/2017 CLINICAL DATA:  Epigastric abdominal pain. EXAM: PORTABLE CHEST 1 VIEW COMPARISON:  Radiographs of August 21, 2016. FINDINGS: The heart size and mediastinal contours are within normal limits. Atherosclerosis of thoracic aorta is noted. No pneumothorax or pleural effusion is noted. Right lung is clear. Mild left basilar subsegmental atelectasis is noted. The visualized skeletal structures are unremarkable. IMPRESSION: Mild left basilar subsegmental atelectasis.  Aortic atherosclerosis. Electronically Signed   By: Marijo Conception, M.D.   On: 02/11/2017 16:34   Ct Angio Abd/pel W/ And/or W/o  Result Date: 02/11/2017 CLINICAL DATA:  Nausea and abdominal pain after eating. Clinical concern for vascular insufficiency. EXAM: CTA ABDOMEN AND PELVIS WITHOUT AND WITH CONTRAST TECHNIQUE: Multidetector CT imaging of the abdomen and pelvis was performed using the standard protocol during bolus administration of intravenous contrast. Multiplanar reconstructed images and MIPs were obtained and reviewed to evaluate the vascular anatomy. CONTRAST:  75 cc Isovue 370 COMPARISON:  Chest, abdomen and  pelvis  CT dated 01/14/2009. FINDINGS: VASCULAR Aorta: Marked calcified and noncalcified plaque formation in the anterior aspect of the proximal abdominal aorta at the level of the celiac axis and superior mesenteric artery origins. Additional calcified plaque formation elsewhere in the aorta. No aortic aneurysm or dissection seen. 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 There is collateral reconstitution distal to the occlusion. SMA: Marked calcified and noncalcified plaque formation at the origin with complete or near complete occlusion of a 3 mm segment of the proximal superior mesenteric artery. Renals: Single right renal artery with plaque formation at the origin producing approximately 50% stenosis. One anterior and 1 posterior left renal artery with plaque formation at the origin of both. There is approximately 80% stenosis of the proximal portion of the anterior artery at its origin. The posterior artery is completely occluded over a 3 mm long segment proximally. IMA: Calcified and noncalcified plaque formation at the origin with complete or near complete occlusion. Inflow: Mild bilateral iliac calcified plaque formation without significant stenosis on either side. Proximal Outflow: Normal appearing proximal common femoral, femoral and profunda femoral arteries. Veins: No venous thrombosis demonstrated. Review of the MIP images confirms the above findings. NON-VASCULAR Lower chest: Linear atelectasis or scarring at both lung bases. Hepatobiliary: No focal liver abnormality is seen. No gallstones, gallbladder wall thickening, or biliary dilatation. Pancreas: Unremarkable. No pancreatic ductal dilatation or surrounding inflammatory changes. Spleen: Normal in size without focal abnormality. Adrenals/Urinary Tract: Bilateral renal cysts. Mildly irregular and heterogeneous posterior bladder mass, measuring 4.7 x 3.0 cm  on image number 168 of series 4. This measures 3.2 cm in length on sagittal image number 85 series seen. Unremarkable ureters. No urinary tract calculi or hydronephrosis. Mild bilateral adrenal hyperplasia. Stomach/Bowel: Small hiatal hernia. Mild sigmoid colon diverticulosis. Poorly distended ascending colon with some irregular wall thickening. Unremarkable small bowel. Lymphatic: No enlarged lymph nodes. Reproductive: Uterus and bilateral adnexa are unremarkable. Other: No abdominal wall hernia or abnormality. No abdominopelvic ascites. Musculoskeletal: Approximately 20% L2 superior endplate compression deformity with Schmorl's node formation. No acute fracture lines or bony retropulsion. Mild lumbar and lower thoracic spine degenerative changes. IMPRESSION: VASCULAR 1. Marked densely calcified and noncalcified plaque formation in the anterior aspect of the proximal abdominal aorta at the origins of the celiac axis and superior mesenteric artery. 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 2. Plaque formation causing complete or near complete occlusion of the inferior mesenteric artery at its origin. 3. Plaque formation causing complete occlusion of a 3 mm long segment of the proximal portion of the posterior left renal artery and plaque formation causing approximately 80% stenosis of the proximal portion of the anterior left renal artery. 4. Plaque formation causing approximately 50% stenosis of the origin of the right renal artery. 5. Catheter angiography and stent placement as indicated at that time is recommended. NON-VASCULAR 1. **An incidental finding of potential clinical significance has been found. 4.7 x 3.2 x 3.0 cm posterior bladder mass. This is compatible with a primary bladder malignancy.** 2. **An incidental finding of potential clinical significance has been found.  Irregular wall thickening involving the poorly distended ascending colon segment, suspicious for the possibility of colon cancer. Correlation with colonoscopy is recommended.** 3. Small hiatal hernia. 4. Mild sigmoid diverticulosis. Electronically Signed  By: Claudie Revering M.D.   On: 02/11/2017 17:34      Assessment/Plan 1. mesenteric ischemia. Highly symptomatic over the past few weeks. I have independently reviewed her CT scan and she does have very significant vascular disease of the 3 visceral vessels with likely occlusion of the celiac, possible occlusion of the IMA, and likely at least high-grade stenosis of the superior mesenteric artery. Although she has the other associated possible malignancies, I suspect this is the reason for her symptoms currently. Her underlying malignancies make her more thrombotic and this may be somewhat of an acute on chronic situation. I would recommend heparin although with her possible malignancies, the risk of bleeding would be high and if this is held that is reasonable. I would recommend a mesenteric angiogram and I have discussed the risks and benefits of this procedure. I will get that scheduled for tomorrow. She voices her understanding and agrees to proceed. We discussed this is a very difficult problem with a high associated morbidity and mortality. This is complicated by the potential malignancies as well. Open surgical therapy may be required on review of her CT scan due to the very bulky plaque at the origins of the visceral vessels, but if endovascular therapy is possible given her other ongoing issues that would be preferred. We will know more during the angiogram 2. bladder and colon masses worrisome for malignancy. Would cause her to be prothrombotic. Would likely need urology and potentially GI to see while in the hospital. 3. hypertension. Stable outpatient medications and blood pressure control important in reducing the progression of atherosclerotic  disease. On appropriate oral medications. 4. COPD/Asthma. From long-standing previous tobacco use. May be concerning if she does require open surgical bypass and not percutaneous intervention.   Leotis Pain, MD  02/11/2017 8:14 PM    This note was created with Dragon medical transcription system.  Any error is purely unintentional

## 2017-02-12 ENCOUNTER — Encounter
Admission: EM | Disposition: A | Discharge status: Home Health | Payer: Self-pay | Source: Home / Self Care | Attending: Internal Medicine

## 2017-02-12 DIAGNOSIS — R1084 Generalized abdominal pain: Secondary | ICD-10-CM

## 2017-02-12 DIAGNOSIS — N3289 Other specified disorders of bladder: Secondary | ICD-10-CM

## 2017-02-12 DIAGNOSIS — R110 Nausea: Secondary | ICD-10-CM

## 2017-02-12 DIAGNOSIS — K551 Chronic vascular disorders of intestine: Secondary | ICD-10-CM

## 2017-02-12 DIAGNOSIS — K55059 Acute (reversible) ischemia of intestine, part and extent unspecified: Secondary | ICD-10-CM

## 2017-02-12 HISTORY — PX: VISCERAL ARTERY INTERVENTION: CATH118277

## 2017-02-12 LAB — BASIC METABOLIC PANEL
ANION GAP: 10 (ref 5–15)
BUN: 17 mg/dL (ref 6–20)
CALCIUM: 8.6 mg/dL — AB (ref 8.9–10.3)
CHLORIDE: 102 mmol/L (ref 101–111)
CO2: 26 mmol/L (ref 22–32)
Creatinine, Ser: 0.84 mg/dL (ref 0.44–1.00)
GFR calc non Af Amer: >60 mL/min (ref >60)
Glucose, Bld: 143 mg/dL — ABNORMAL HIGH (ref 65–99)
POTASSIUM: 3 mmol/L — AB (ref 3.5–5.1)
Sodium: 138 mmol/L (ref 135–145)

## 2017-02-12 LAB — DIFFERENTIAL
BAND NEUTROPHILS: 2 %
Basophils Absolute: 0 10*3/uL (ref 0–0.1)
Basophils Relative: 0 %
Blasts: 0 %
EOS ABS: 0 10*3/uL (ref 0–0.7)
EOS PCT: 0 %
LYMPHS ABS: 1.6 10*3/uL (ref 1.0–3.6)
Lymphocytes Relative: 6 %
METAMYELOCYTES PCT: 1 %
MONOS PCT: 10 %
MYELOCYTES: 0 %
Monocytes Absolute: 2.6 10*3/uL — ABNORMAL HIGH (ref 0.2–0.9)
NEUTROS ABS: 22 10*3/uL — AB (ref 1.4–6.5)
NEUTROS PCT: 81 %
OTHER: 0 %
Promyelocytes Absolute: 0 %
nRBC: 0 /100 WBC

## 2017-02-12 LAB — PHOSPHORUS: Phosphorus: 2.4 mg/dL — ABNORMAL LOW (ref 2.5–4.6)

## 2017-02-12 LAB — MAGNESIUM: MAGNESIUM: 1.9 mg/dL (ref 1.7–2.4)

## 2017-02-12 LAB — TROPONIN I: Troponin I: 0.03 ng/mL (ref <0.03)

## 2017-02-12 SURGERY — VISCERAL ARTERY INTERVENTION
Anesthesia: Moderate Sedation

## 2017-02-12 MED ORDER — BUDESONIDE 0.5 MG/2ML IN SUSP
0.5000 mg | Freq: Two times a day (BID) | RESPIRATORY_TRACT | Status: DC
  Administered 2017-02-12 – 2017-02-15 (×5): 0.5 mg via RESPIRATORY_TRACT
  Filled 2017-02-12 (×5): qty 2

## 2017-02-12 MED ORDER — ARFORMOTEROL TARTRATE 15 MCG/2ML IN NEBU
15.0000 ug | INHALATION_SOLUTION | Freq: Two times a day (BID) | RESPIRATORY_TRACT | Status: DC
  Administered 2017-02-12 – 2017-02-15 (×5): 15 ug via RESPIRATORY_TRACT
  Filled 2017-02-12 (×6): qty 2

## 2017-02-12 MED ORDER — FENTANYL CITRATE (PF) 100 MCG/2ML IJ SOLN
INTRAMUSCULAR | Status: DC | PRN
  Administered 2017-02-12: 50 ug via INTRAVENOUS
  Administered 2017-02-12 (×2): 25 ug via INTRAVENOUS

## 2017-02-12 MED ORDER — SODIUM CHLORIDE 0.9 % IV SOLN
INTRAVENOUS | Status: DC
  Administered 2017-02-12: 15:00:00 via INTRAVENOUS

## 2017-02-12 MED ORDER — ASPIRIN EC 81 MG PO TBEC
81.0000 mg | DELAYED_RELEASE_TABLET | Freq: Every day | ORAL | Status: DC
  Administered 2017-02-14 – 2017-02-15 (×2): 81 mg via ORAL
  Filled 2017-02-12 (×2): qty 1

## 2017-02-12 MED ORDER — HEPARIN SODIUM (PORCINE) 1000 UNIT/ML IJ SOLN
INTRAMUSCULAR | Status: AC
  Filled 2017-02-12: qty 3

## 2017-02-12 MED ORDER — FENTANYL CITRATE (PF) 100 MCG/2ML IJ SOLN
INTRAMUSCULAR | Status: AC
  Filled 2017-02-12: qty 2

## 2017-02-12 MED ORDER — LIDOCAINE-EPINEPHRINE (PF) 1 %-1:200000 IJ SOLN
INTRAMUSCULAR | Status: AC
  Filled 2017-02-12: qty 30

## 2017-02-12 MED ORDER — SODIUM CHLORIDE 0.9 % IV SOLN
3.0000 g | Freq: Four times a day (QID) | INTRAVENOUS | Status: DC
  Filled 2017-02-12 (×3): qty 3

## 2017-02-12 MED ORDER — POTASSIUM CHLORIDE CRYS ER 20 MEQ PO TBCR
30.0000 meq | EXTENDED_RELEASE_TABLET | ORAL | Status: AC
  Administered 2017-02-12 (×2): 30 meq via ORAL
  Filled 2017-02-12 (×2): qty 1

## 2017-02-12 MED ORDER — K PHOS MONO-SOD PHOS DI & MONO 155-852-130 MG PO TABS
500.0000 mg | ORAL_TABLET | Freq: Two times a day (BID) | ORAL | Status: DC
  Administered 2017-02-12 (×2): 500 mg via ORAL
  Filled 2017-02-12 (×3): qty 2

## 2017-02-12 MED ORDER — HEPARIN (PORCINE) IN NACL 2-0.9 UNIT/ML-% IJ SOLN
INTRAMUSCULAR | Status: AC
  Filled 2017-02-12: qty 1000

## 2017-02-12 MED ORDER — POTASSIUM CHLORIDE CRYS ER 20 MEQ PO TBCR: 30.0000 meq | EXTENDED_RELEASE_TABLET | ORAL | Status: DC

## 2017-02-12 MED ORDER — CEFUROXIME SODIUM 1.5 G IV SOLR
1.5000 g | INTRAVENOUS | Status: DC
  Filled 2017-02-12: qty 1.5

## 2017-02-12 MED ORDER — HEPARIN SODIUM (PORCINE) 1000 UNIT/ML IJ SOLN
INTRAMUSCULAR | Status: DC | PRN
  Administered 2017-02-12: 1500 [IU] via INTRAVENOUS
  Administered 2017-02-12: 3000 [IU] via INTRAVENOUS

## 2017-02-12 MED ORDER — POLYETHYLENE GLYCOL 3350 17 GM/SCOOP PO POWD
1.0000 | Freq: Once | ORAL | Status: AC
  Administered 2017-02-12: 18:00:00 255 g via ORAL
  Filled 2017-02-12: qty 255

## 2017-02-12 MED ORDER — MIDAZOLAM HCL 2 MG/2ML IJ SOLN
INTRAMUSCULAR | Status: DC | PRN
  Administered 2017-02-12 (×2): 1 mg via INTRAVENOUS

## 2017-02-12 MED ORDER — MIDAZOLAM HCL 2 MG/2ML IJ SOLN
INTRAMUSCULAR | Status: AC
  Filled 2017-02-12: qty 2

## 2017-02-12 MED ORDER — SODIUM CHLORIDE 0.9 % IV SOLN
1.5000 g | Freq: Four times a day (QID) | INTRAVENOUS | Status: DC
  Administered 2017-02-12 – 2017-02-14 (×7): 1.5 g via INTRAVENOUS
  Filled 2017-02-12 (×9): qty 1.5

## 2017-02-12 SURGICAL SUPPLY — 14 items
CATH PIG 70CM (CATHETERS) ×3 IMPLANT
CATH VS15FR (CATHETERS) ×3 IMPLANT
COVER PROBE U/S 5X48 (MISCELLANEOUS) ×3 IMPLANT
DEVICE PRESTO INFLATION (MISCELLANEOUS) ×6 IMPLANT
DEVICE STARCLOSE SE CLOSURE (Vascular Products) ×3 IMPLANT
DEVICE TORQUE (MISCELLANEOUS) ×3 IMPLANT
GLIDEWIRE STIFF .35X180X3 HYDR (WIRE) ×3 IMPLANT
SHEATH BRITE TIP 5FRX11 (SHEATH) ×3 IMPLANT
SHEATH PINNACLE ST 6F 45CM (SHEATH) ×3 IMPLANT
STENT LIFESTREAM 6X26X80 (Permanent Stent) ×3 IMPLANT
TOWEL OR 17X26 4PK STRL BLUE (TOWEL DISPOSABLE) ×3 IMPLANT
TUBING CONTRAST HIGH PRESS 72 (TUBING) ×3 IMPLANT
WIRE J 3MM .035X145CM (WIRE) ×3 IMPLANT
WIRE MAGIC TOR.035 180C (WIRE) ×3 IMPLANT

## 2017-02-12 NOTE — Progress Notes (Addendum)
Pharmacy Antibiotic Note  Tina [name] is a 82 y.o. female admitted on 02/11/2017 with Intra-abdominal infection.  Pharmacy has been consulted for Unasyn dosing.  Plan: Renal function is improving, CrCL now 37ml/min. Will order Unasyn 1.5g IV q6 hours.   Height: 5\' 5"  (165.1 cm) Weight: 126 lb (57.2 kg) IBW/kg (Calculated) : 57  Temp (24hrs), Avg:98.2 F (36.8 C), Min:98 F (36.7 C), Max:98.4 F (36.9 C)   Recent Labs Lab 02/08/17 1520 02/09/17 0955 02/11/17 1516 02/11/17 1547 02/12/17 0309  WBC 14.9*  --  26.2*  --   --   CREATININE  --  1.23* 1.18*  --  0.84  LATICACIDVEN  --   --   --  1.9  --     Estimated Creatinine Clearance: 48.1 mL/min (by C-G formula based on SCr of 0.84 mg/dL).    Allergies  Allergen Reactions  . Evista [Raloxifene] Other (See Comments)    Night sweats Night sweats Reaction:  Night sweats   . Fluticasone-Salmeterol Other (See Comments)    Reaction:  Cough     Antimicrobials this admission: Unaysn 9/30 >>   Thank you for allowing pharmacy to be a part of this patient's care.  Pernell Dupre, PharmD, BCPS Clinical Pharmacist 02/12/2017 9:14 AM

## 2017-02-12 NOTE — Consult Note (Signed)
Urology Consult  I have been asked to see the patient by Dr. Posey Pronto, for evaluation and management of bladder mass.  Chief Complaint: abdominal pain  History of Present Illness: Tina [name] is a 82 y.o. year old former smoker who is admitted by the hospitalist service yesterday for postprandial abdominal pain felt to be related to mesenteric bowel ischemia.  As part of her workup, she underwent CT angiogram of the abdomen and pelvis with contrast yesterday revealing heterogeneous posterior bladder wall mass measuring 4.7 x 3 cm without any hydronephrosis or lymphadenopathy.  Also notable and CT was poorly distended ascending colon with some irregular wall thickening. She is scheduled for colonoscopy in the morning with Dr. Allen Norris.  She does admit today that she had pressure in her bladder last week along with an episode of gross hematuria.   She was seen and evaluated by her PCP at which time incidental microscopic hematuria was noted in her blood.  Urine culture grew 10,000 colonies of mixed flora.  She is a former smoker, quit approximately 15 years ago. She does have COPD.  She was previously followed by Dr. Jacqlyn Larsen and Zara Council for bilateral renal cysts which were benign.   Most recent bladder imaging in the form of renal ultrasound in 2016 without evidence of bladder lesion at the time.     Past Medical History:  Diagnosis Date  . Anemia   . Asthma   . BRCA positive   . CAD (coronary artery disease)   . COPD (chronic obstructive pulmonary disease) ([place])   . Emphysema lung ([place])   . GERD (gastroesophageal reflux disease)   . History of colon polyps   . Hypercholesterolemia   . Hyperglycemia   . Hypertension   . Lung nodules   . Osteoporosis   . Personal history of tobacco use, presenting hazards to health 11/25/2014  . Polycythemia vera(238.4)   . Renal cyst   . Skin cancer     Past Surgical History:  Procedure Laterality Date  . ANGIOPLASTY     coronary (x1)  .  COLONOSCOPY WITH PROPOFOL N/A 03/02/2015   Procedure: COLONOSCOPY WITH PROPOFOL;  Surgeon: Lollie Sails, MD;  Location: [facility] ENDOSCOPY;  Service: Endoscopy;  Laterality: N/A;  . CORONARY ANGIOPLASTY    . CORONARY ARTERY BYPASS GRAFT    . SALPINGOOPHORECTOMY    . TONSILECTOMY/ADENOIDECTOMY WITH MYRINGOTOMY    . TUBAL LIGATION      Home Medications:  Current Meds  Medication Sig  . amLODipine (NORVASC) 5 MG tablet TAKE 1 TABLET BY MOUTH  DAILY  . cholecalciferol (VITAMIN D) 1000 units tablet Take 1,000 Units by mouth daily.  Marland Kitchen losartan (COZAAR) 100 MG tablet TAKE 1 TABLET BY MOUTH  DAILY  . lovastatin (MEVACOR) 40 MG tablet TAKE 1 TABLET BY MOUTH  DAILY  . metoprolol tartrate (LOPRESSOR) 25 MG tablet TAKE ONE-HALF TABLET BY  MOUTH TWO TIMES DAILY  . ondansetron (ZOFRAN) 4 MG tablet Take 1 tablet (4 mg total) by mouth every 8 (eight) hours as needed for nausea or vomiting.  . pantoprazole (PROTONIX) 40 MG tablet Take 1 tablet (40 mg total) by mouth daily.  . potassium chloride (K-DUR) 10 MEQ tablet Take 1 tablet twice a day for two days , then take 1 tablet daily then on.  . SYMBICORT 160-4.5 MCG/ACT inhaler USE 2 PUFFS TWO TIMES DAILY  . triamterene-hydrochlorothiazide (MAXZIDE-25) 37.5-25 MG tablet Take 0.5 tablets by mouth daily.    Allergies:  Allergies  Allergen  Reactions  . Evista [Raloxifene] Other (See Comments)    Night sweats Night sweats Reaction:  Night sweats   . Fluticasone-Salmeterol Other (See Comments)    Reaction:  Cough     Family History  Problem Relation Age of Onset  . Cirrhosis Father        died age 59  . Alcohol abuse Father   . Asthma Mother   . Congestive Heart Failure Mother   . Breast cancer Mother        2 (1/2 sisters)  . Osteoarthritis Mother   . Colon cancer Mother   . Lupus Sister   . Alcohol abuse Sister   . Ovarian cancer Sister   . Osteoporosis Sister   . Skin cancer Sister   . Breast cancer Cousin   . Breast cancer Maternal  Aunt     Social History:  reports that she quit smoking about 13 years ago. Her smoking use included Cigarettes. She has a 45.00 pack-year smoking history. She has never used smokeless tobacco. She reports that she does not drink alcohol or use drugs.  ROS: A complete review of systems was performed.  All systems are negative except for pertinent findings as noted.  Physical Exam:  Vital signs in last 24 hours: Temp:  [98 F (36.7 C)-98.4 F (36.9 C)] 98.4 F (36.9 C) (10/01 0409) Pulse Rate:  [71-81] 71 (10/01 0409) Resp:  [16-18] 18 (10/01 0409) BP: (158-178)/(56-100) 162/56 (10/01 0409) SpO2:  [95 %-100 %] 96 % (10/01 0409) Weight:  [126 lb (57.2 kg)] 126 lb (57.2 kg) (09/30 2033) Constitutional:  Alert and oriented, No acute distress HEENT: [place] AT, moist mucus membranes.  Trachea midline, no masses Cardiovascular: Regular rate and rhythm, no clubbing, cyanosis, or edema. Respiratory: Normal respiratory effort, lungs clear bilaterally GI: Abdomen is soft, nontender, nondistended, no abdominal masses GU: No CVA tenderness Skin: No rashes, bruises or suspicious lesions Lymph: No cervical or inguinal adenopathy Neurologic: Grossly intact, no focal deficits, moving all 4 extremities Psychiatric: Normal mood and affect   Laboratory Data:   Recent Labs  02/11/17 1516  WBC 26.2*  HGB 15.7  HCT 45.2    Recent Labs  02/11/17 1516 02/12/17 0309  NA 136 138  K 2.9* 3.0*  CL 96* 102  CO2 27 26  GLUCOSE 162* 143*  BUN 22* 17  CREATININE 1.18* 0.84  CALCIUM 9.4 8.6*   No results for input(s): LABPT, INR in the last 72 hours. No results for input(s): LABURIN in the last 72 hours. Results for orders placed or performed during the hospital encounter of 02/11/17  Gastrointestinal Panel by PCR , Stool     Status: None   Collection Time: 02/11/17  4:47 PM  Result Value Ref Range Status   Campylobacter species NOT DETECTED NOT DETECTED Final   Plesimonas shigelloides NOT  DETECTED NOT DETECTED Final   Salmonella species NOT DETECTED NOT DETECTED Final   Yersinia enterocolitica NOT DETECTED NOT DETECTED Final   Vibrio species NOT DETECTED NOT DETECTED Final   Vibrio cholerae NOT DETECTED NOT DETECTED Final   Enteroaggregative E coli (EAEC) NOT DETECTED NOT DETECTED Final   Enteropathogenic E coli (EPEC) NOT DETECTED NOT DETECTED Final   Enterotoxigenic E coli (ETEC) NOT DETECTED NOT DETECTED Final   Shiga like toxin producing E coli (STEC) NOT DETECTED NOT DETECTED Final   Shigella/Enteroinvasive E coli (EIEC) NOT DETECTED NOT DETECTED Final   Cryptosporidium NOT DETECTED NOT DETECTED Final   Cyclospora cayetanensis NOT DETECTED  NOT DETECTED Final   Entamoeba histolytica NOT DETECTED NOT DETECTED Final   Giardia lamblia NOT DETECTED NOT DETECTED Final   Adenovirus F40/41 NOT DETECTED NOT DETECTED Final   Astrovirus NOT DETECTED NOT DETECTED Final   Norovirus GI/GII NOT DETECTED NOT DETECTED Final   Rotavirus A NOT DETECTED NOT DETECTED Final   Sapovirus (I, II, IV, and V) NOT DETECTED NOT DETECTED Final  C difficile quick scan w PCR reflex     Status: None   Collection Time: 02/11/17  4:47 PM  Result Value Ref Range Status   C Diff antigen NEGATIVE NEGATIVE Final   C Diff toxin NEGATIVE NEGATIVE Final   C Diff interpretation No C. difficile detected.  Final     Radiologic Imaging: Dg Chest Portable 1 View  Result Date: 02/11/2017 CLINICAL DATA:  Epigastric abdominal pain. EXAM: PORTABLE CHEST 1 VIEW COMPARISON:  Radiographs of August 21, 2016. FINDINGS: The heart size and mediastinal contours are within normal limits. Atherosclerosis of thoracic aorta is noted. No pneumothorax or pleural effusion is noted. Right lung is clear. Mild left basilar subsegmental atelectasis is noted. The visualized skeletal structures are unremarkable. IMPRESSION: Mild left basilar subsegmental atelectasis.  Aortic atherosclerosis. Electronically Signed   By: Marijo Conception,  M.D.   On: 02/11/2017 16:34   Ct Angio Abd/pel W/ And/or W/o  Result Date: 02/11/2017 CLINICAL DATA:  Nausea and abdominal pain after eating. Clinical concern for vascular insufficiency. EXAM: CTA ABDOMEN AND PELVIS WITHOUT AND WITH CONTRAST TECHNIQUE: Multidetector CT imaging of the abdomen and pelvis was performed using the standard protocol during bolus administration of intravenous contrast. Multiplanar reconstructed images and MIPs were obtained and reviewed to evaluate the vascular anatomy. CONTRAST:  75 cc Isovue 370 COMPARISON:  Chest, abdomen and pelvis CT dated 01/14/2009. FINDINGS: VASCULAR Aorta: Marked calcified and noncalcified plaque formation in the anterior aspect of the proximal abdominal aorta at the level of the celiac axis and superior mesenteric artery origins. Additional calcified plaque formation elsewhere in the aorta. No aortic aneurysm or dissection seen. 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 There is collateral reconstitution distal to the occlusion. SMA: Marked calcified and noncalcified plaque formation at the origin with complete or near complete occlusion of a 3 mm segment of the proximal superior mesenteric artery. Renals: Single right renal artery with plaque formation at the origin producing approximately 50% stenosis. One anterior and 1 posterior left renal artery with plaque formation at the origin of both. There is approximately 80% stenosis of the proximal portion of the anterior artery at its origin. The posterior artery is completely occluded over a 3 mm long segment proximally. IMA: Calcified and noncalcified plaque formation at the origin with complete or near complete occlusion. Inflow: Mild bilateral iliac calcified plaque formation without significant stenosis on either side. Proximal Outflow: Normal appearing proximal common femoral, femoral and profunda femoral  arteries. Veins: No venous thrombosis demonstrated. Review of the MIP images confirms the above findings. NON-VASCULAR Lower chest: Linear atelectasis or scarring at both lung bases. Hepatobiliary: No focal liver abnormality is seen. No gallstones, gallbladder wall thickening, or biliary dilatation. Pancreas: Unremarkable. No pancreatic ductal dilatation or surrounding inflammatory changes. Spleen: Normal in size without focal abnormality. Adrenals/Urinary Tract: Bilateral renal cysts. Mildly irregular and heterogeneous posterior bladder mass, measuring 4.7 x 3.0 cm on image number 168 of series 4. This measures 3.2 cm in length on sagittal image  number 85 series seen. Unremarkable ureters. No urinary tract calculi or hydronephrosis. Mild bilateral adrenal hyperplasia. Stomach/Bowel: Small hiatal hernia. Mild sigmoid colon diverticulosis. Poorly distended ascending colon with some irregular wall thickening. Unremarkable small bowel. Lymphatic: No enlarged lymph nodes. Reproductive: Uterus and bilateral adnexa are unremarkable. Other: No abdominal wall hernia or abnormality. No abdominopelvic ascites. Musculoskeletal: Approximately 20% L2 superior endplate compression deformity with Schmorl's node formation. No acute fracture lines or bony retropulsion. Mild lumbar and lower thoracic spine degenerative changes. IMPRESSION: VASCULAR 1. Marked densely calcified and noncalcified plaque formation in the anterior aspect of the proximal abdominal aorta at the origins of the celiac axis and superior mesenteric artery. 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 2. Plaque formation causing complete or near complete occlusion of the inferior mesenteric artery at its origin. 3. Plaque formation causing complete occlusion of a 3 mm long segment of the proximal portion of the posterior left renal  artery and plaque formation causing approximately 80% stenosis of the proximal portion of the anterior left renal artery. 4. Plaque formation causing approximately 50% stenosis of the origin of the right renal artery. 5. Catheter angiography and stent placement as indicated at that time is recommended. NON-VASCULAR 1. **An incidental finding of potential clinical significance has been found. 4.7 x 3.2 x 3.0 cm posterior bladder mass. This is compatible with a primary bladder malignancy.** 2. **An incidental finding of potential clinical significance has been found. Irregular wall thickening involving the poorly distended ascending colon segment, suspicious for the possibility of colon cancer. Correlation with colonoscopy is recommended.** 3. Small hiatal hernia. 4. Mild sigmoid diverticulosis. Electronically Signed   By: Claudie Revering M.D.   On: 02/11/2017 17:34    Impression/Plan:  1. Bladder mass - based on appearance on CT scan and microscopic hematuria, highly suspicious for primary bladder malignancy. If highly recommended proceeding for TURBT, bilateral retrograde pyelogram, possible instillation of intravesical mitomycin ASAP both for therapeutic and staging purposes. The setting of bowel ischemia and possible need for anticoagulation the future, I do feel that resecting this tumor prior to initiation of anticoagulation will reduce her risk of hematuria and intolerance of the medication. We discussed the natural history of bladder cancer in detail. Her daughter was at the bedside today. All questions were answered. We'll tentatively add her onto the schedule to mark, discussed with Dr. Allen Norris who is agreed to do her colonoscopy as first case with urology procedure to follow.   2. Acute kidney injury- Cr slightly elevated, no obvious hydronephrosis as contributing factor.    3. Colon mass-discussed with Dr. Allen Norris as above. Scheduled for colonoscopy in a.m. prior to TURBT.   4. Bowel ischemia- please  hold off on anticoagulation until following TURBT procedure if reasonably appropriate from a medical standpoint.   02/12/2017, 11:20 AM  Hollice Espy,  MD

## 2017-02-12 NOTE — Progress Notes (Signed)
[facility] NAME: Semira Stoltzfus    MR#:  [id number]  DATE OF BIRTH:  [birth year]  SUBJECTIVE:   Complains of abdominal discomfort and runny stool. REVIEW OF SYSTEMS:   Review of Systems  Constitutional: Negative for chills, fever and weight loss.  HENT: Negative for ear discharge, ear pain and nosebleeds.   Eyes: Negative for blurred vision, pain and discharge.  Respiratory: Negative for sputum production, shortness of breath, wheezing and stridor.   Cardiovascular: Negative for chest pain, palpitations, orthopnea and PND.  Gastrointestinal: Positive for abdominal pain and nausea. Negative for diarrhea and vomiting.  Genitourinary: Negative for frequency and urgency.  Musculoskeletal: Negative for back pain and joint pain.  Neurological: Positive for weakness. Negative for sensory change, speech change and focal weakness.  Psychiatric/Behavioral: Negative for depression and hallucinations. The patient is not nervous/anxious.    Tolerating Diet:very little Tolerating PT: pending  DRUG ALLERGIES:   Allergies  Allergen Reactions  . Evista [Raloxifene] Other (See Comments)    Night sweats Night sweats Reaction:  Night sweats   . Fluticasone-Salmeterol Other (See Comments)    Reaction:  Cough     VITALS:  Blood pressure (!) 162/56, pulse 71, temperature 98.4 F (36.9 C), resp. rate 18, height 5\' 5"  (1.651 m), weight 57.2 kg (126 lb), SpO2 96 %.  PHYSICAL EXAMINATION:   Physical Exam  GENERAL:  82 y.o.-year-old patient lying in the bed with no acute distress.thin cachectic  EYES: Pupils equal, round, reactive to light and accommodation. No scleral icterus. Extraocular muscles intact.  HEENT: Head atraumatic, normocephalic. Oropharynx and nasopharynx clear.  NECK:  Supple, no jugular venous distention. No thyroid enlargement, no tenderness.  LUNGS: Normal breath sounds bilaterally, no wheezing, rales, rhonchi. No use  of accessory muscles of respiration.  CARDIOVASCULAR: S1, S2 normal. No murmurs, rubs, or gallops.  ABDOMEN: Soft, nontender, nondistended. Bowel sounds present. No organomegaly or mass.  EXTREMITIES: No cyanosis, clubbing or edema b/l.    NEUROLOGIC: Cranial nerves II through XII are intact. No focal Motor or sensory deficits b/l.   PSYCHIATRIC:  patient is alert and oriented x 3.  SKIN: No obvious rash, lesion, or ulcer.   LABORATORY PANEL:  CBC  Recent Labs Lab 02/11/17 1516  WBC 26.2*  HGB 15.7  HCT 45.2  PLT 256    Chemistries   Recent Labs Lab 02/11/17 1516 02/12/17 0309  NA 136 138  K 2.9* 3.0*  CL 96* 102  CO2 27 26  GLUCOSE 162* 143*  BUN 22* 17  CREATININE 1.18* 0.84  CALCIUM 9.4 8.6*  MG  --  1.9  AST 33  --   ALT 21  --   ALKPHOS 63  --   BILITOT 1.3*  --    Cardiac Enzymes  Recent Labs Lab 02/11/17 1516  TROPONINI 0.03*   RADIOLOGY:  Dg Chest Portable 1 View  Result Date: 02/11/2017 CLINICAL DATA:  Epigastric abdominal pain. EXAM: PORTABLE CHEST 1 VIEW COMPARISON:  Radiographs of August 21, 2016. FINDINGS: The heart size and mediastinal contours are within normal limits. Atherosclerosis of thoracic aorta is noted. No pneumothorax or pleural effusion is noted. Right lung is clear. Mild left basilar subsegmental atelectasis is noted. The visualized skeletal structures are unremarkable. IMPRESSION: Mild left basilar subsegmental atelectasis.  Aortic atherosclerosis. Electronically Signed   By: Marijo Conception, M.D.   On: 02/11/2017 16:34   Ct Angio Abd/pel W/ And/or W/o  Result Date: 02/11/2017  CLINICAL DATA:  Nausea and abdominal pain after eating. Clinical concern for vascular insufficiency. EXAM: CTA ABDOMEN AND PELVIS WITHOUT AND WITH CONTRAST TECHNIQUE: Multidetector CT imaging of the abdomen and pelvis was performed using the standard protocol during bolus administration of intravenous contrast. Multiplanar reconstructed images and MIPs were  obtained and reviewed to evaluate the vascular anatomy. CONTRAST:  75 cc Isovue 370 COMPARISON:  Chest, abdomen and pelvis CT dated 01/14/2009. FINDINGS: VASCULAR Aorta: Marked calcified and noncalcified plaque formation in the anterior aspect of the proximal abdominal aorta at the level of the celiac axis and superior mesenteric artery origins. Additional calcified plaque formation elsewhere in the aorta. No aortic aneurysm or dissection seen. 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 There is collateral reconstitution distal to the occlusion. SMA: Marked calcified and noncalcified plaque formation at the origin with complete or near complete occlusion of a 3 mm segment of the proximal superior mesenteric artery. Renals: Single right renal artery with plaque formation at the origin producing approximately 50% stenosis. One anterior and 1 posterior left renal artery with plaque formation at the origin of both. There is approximately 80% stenosis of the proximal portion of the anterior artery at its origin. The posterior artery is completely occluded over a 3 mm long segment proximally. IMA: Calcified and noncalcified plaque formation at the origin with complete or near complete occlusion. Inflow: Mild bilateral iliac calcified plaque formation without significant stenosis on either side. Proximal Outflow: Normal appearing proximal common femoral, femoral and profunda femoral arteries. Veins: No venous thrombosis demonstrated. Review of the MIP images confirms the above findings. NON-VASCULAR Lower chest: Linear atelectasis or scarring at both lung bases. Hepatobiliary: No focal liver abnormality is seen. No gallstones, gallbladder wall thickening, or biliary dilatation. Pancreas: Unremarkable. No pancreatic ductal dilatation or surrounding inflammatory changes. Spleen: Normal in size without focal abnormality.  Adrenals/Urinary Tract: Bilateral renal cysts. Mildly irregular and heterogeneous posterior bladder mass, measuring 4.7 x 3.0 cm on image number 168 of series 4. This measures 3.2 cm in length on sagittal image number 85 series seen. Unremarkable ureters. No urinary tract calculi or hydronephrosis. Mild bilateral adrenal hyperplasia. Stomach/Bowel: Small hiatal hernia. Mild sigmoid colon diverticulosis. Poorly distended ascending colon with some irregular wall thickening. Unremarkable small bowel. Lymphatic: No enlarged lymph nodes. Reproductive: Uterus and bilateral adnexa are unremarkable. Other: No abdominal wall hernia or abnormality. No abdominopelvic ascites. Musculoskeletal: Approximately 20% L2 superior endplate compression deformity with Schmorl's node formation. No acute fracture lines or bony retropulsion. Mild lumbar and lower thoracic spine degenerative changes. IMPRESSION: VASCULAR 1. Marked densely calcified and noncalcified plaque formation in the anterior aspect of the proximal abdominal aorta at the origins of the celiac axis and superior mesenteric artery. 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 2. Plaque formation causing complete or near complete occlusion of the inferior mesenteric artery at its origin. 3. Plaque formation causing complete occlusion of a 3 mm long segment of the proximal portion of the posterior left renal artery and plaque formation causing approximately 80% stenosis of the proximal portion of the anterior left renal artery. 4. Plaque formation causing approximately 50% stenosis of the origin of the right renal artery. 5. Catheter angiography and stent placement as indicated at that time is recommended. NON-VASCULAR 1. **An incidental  finding of potential clinical significance has been found. 4.7 x 3.2 x 3.0 cm posterior bladder mass. This  is compatible with a primary bladder malignancy.** 2. **An incidental finding of potential clinical significance has been found. Irregular wall thickening involving the poorly distended ascending colon segment, suspicious for the possibility of colon cancer. Correlation with colonoscopy is recommended.** 3. Small hiatal hernia. 4. Mild sigmoid diverticulosis. Electronically Signed   By: Claudie Revering M.D.   On: 02/11/2017 17:34   ASSESSMENT AND PLAN:  Jerzie Bieri  is a 82 y.o. female with a known history of asthma, COPD/emphysema, history of colon polyps with last colonoscopy 2016 showed sigmoid diverticulosis comes to the emergency room with increasing nausea and diarrhea for last week and a half. Patient reports feeling fatigued and malaise and loss of weight about 6 pounds and to 3 days. She is unable to eat much.  1. Nausea, diarrhea, weight loss with malaise and fatigue and abdominal CAT scan suggestive of atherosclerotic abdominal vessels with suggestion of possible ischemic colitis, irregular wall thickening involving the ascending colon suspicious for possibility of colon cancer and incidental note of 4.7 x 3.2 cm posterior bladder wall mass -Vascular surgery consultation with Dr. Lucky Cowboy appreciated. -patient is scheduled for abdominal angiogram. Further recommendations after angiogram. She has severe three-vessel atherosclerotic disease -IV fluids -Empiric IV Unasyn  2. Incidental posterior bladder wall mass -Urology consultation with Dr. Erlene Quan appreciated. -patient to be scheduled for TURBT tomorrow. -Urology recommends hold off IV heparin  3. Irregular thickening of descending colon with symptoms of malaise fatigue diarrhea and nausea and weight loss -GI consult with Dr. Allen Norris appreciated -Patient to get the EGD colonoscopy tomorrow  4. Hypokalemia Suspected due to GI losses and due to diuretic -repeat IV and oral potassium  5.Leukocytosis suspected due to #1 -Empiric  Unasyn  6. DVT prophylaxis subcutaneous Lovenox  CODE STATUS discussed with patient. Patient requests/wishes DO NOT RESUSCITATE  Case discussed with Care Management/Social Worker. Management plans discussed with the patient, family and they are in agreement.  CODE STATUS: DNR  DVT Prophylaxis: lovneox  TOTAL TIME TAKING CARE OF THIS PATIENT: *30* minutes.  >50% time spent on counselling and coordination of care  POSSIBLE D/C IN 2-3* DAYS, DEPENDING ON CLINICAL CONDITION.  Note: This dictation was prepared with Dragon dictation along with smaller phrase technology. Any transcriptional errors that result from this process are unintentional.  Prerna Harold M.D on 02/12/2017 at 1:26 PM  Between 7am to 6pm - Pager - [phone]  After 6pm go to [email or web address] - password [facility]  Office  [phone]  CC: Primary care physician; Einar Pheasant, MDPatient ID: [name], female   DOB: 1934-08-03, 82 y.o.   MRN: [id number]

## 2017-02-12 NOTE — H&P (Signed)
[facility] History & Physical Update  The patient was interviewed and re-examined.  The patient's previous History and Physical has been reviewed and is unchanged.  There is no change in the plan of care. We plan to proceed with the scheduled procedure.  Leotis Pain, MD  02/12/2017, 3:57 PM

## 2017-02-12 NOTE — Progress Notes (Signed)
PHARMACY CONSULT NOTE - INITIAL   Pharmacy Consult for Electrolyte Monitoring and Management    Allergies  Allergen Reactions  . Evista [Raloxifene] Other (See Comments)    Night sweats Night sweats Reaction:  Night sweats   . Fluticasone-Salmeterol Other (See Comments)    Reaction:  Cough     Patient Measurements: Height: '5\' 5"'  (165.1 cm) Weight: 126 lb (57.2 kg) IBW/kg (Calculated) : 57  Vital Signs: Temp: 98.4 F (36.9 C) (10/01 0409) BP: 162/56 (10/01 0409) Pulse Rate: 71 (10/01 0409) Intake/Output from previous day: 09/30 0701 - 10/01 0700 In: 578.3 [I.V.:478.3; IV Piggyback:100] Out: -  Intake/Output from this shift: No intake/output data recorded.  Labs:  Recent Labs  02/11/17 1516 02/12/17 0309  WBC 26.2*  --   HGB 15.7  --   HCT 45.2  --   PLT 256  --   CREATININE 1.18* 0.84  MG  --  1.9  PHOS  --  2.4*  ALBUMIN 4.0  --   PROT 7.5  --   AST 33  --   ALT 21  --   ALKPHOS 63  --   BILITOT 1.3*  --    Estimated Creatinine Clearance: 48.1 mL/min (by C-G formula based on SCr of 0.84 mg/dL).   Microbiology: Recent Results (from the past 720 hour(s))  Urine Culture     Status: None   Collection Time: 02/08/17  3:20 PM  Result Value Ref Range Status   MICRO NUMBER: [id number]  Final   SPECIMEN QUALITY: ADEQUATE  Final   Sample Source BLOOD  Final   STATUS: FINAL  Final   Result:   Final    Multiple organisms present, each less than 10,000 CFU/mL. These organisms, commonly found on external and internal genitalia, are considered to be colonizers. No further testing performed.  Gastrointestinal Panel by PCR , Stool     Status: None   Collection Time: 02/11/17  4:47 PM  Result Value Ref Range Status   Campylobacter species NOT DETECTED NOT DETECTED Final   Plesimonas shigelloides NOT DETECTED NOT DETECTED Final   Salmonella species NOT DETECTED NOT DETECTED Final   Yersinia enterocolitica NOT DETECTED NOT DETECTED Final   Vibrio species NOT  DETECTED NOT DETECTED Final   Vibrio cholerae NOT DETECTED NOT DETECTED Final   Enteroaggregative E coli (EAEC) NOT DETECTED NOT DETECTED Final   Enteropathogenic E coli (EPEC) NOT DETECTED NOT DETECTED Final   Enterotoxigenic E coli (ETEC) NOT DETECTED NOT DETECTED Final   Shiga like toxin producing E coli (STEC) NOT DETECTED NOT DETECTED Final   Shigella/Enteroinvasive E coli (EIEC) NOT DETECTED NOT DETECTED Final   Cryptosporidium NOT DETECTED NOT DETECTED Final   Cyclospora cayetanensis NOT DETECTED NOT DETECTED Final   Entamoeba histolytica NOT DETECTED NOT DETECTED Final   Giardia lamblia NOT DETECTED NOT DETECTED Final   Adenovirus F40/41 NOT DETECTED NOT DETECTED Final   Astrovirus NOT DETECTED NOT DETECTED Final   Norovirus GI/GII NOT DETECTED NOT DETECTED Final   Rotavirus A NOT DETECTED NOT DETECTED Final   Sapovirus (I, II, IV, and V) NOT DETECTED NOT DETECTED Final  C difficile quick scan w PCR reflex     Status: None   Collection Time: 02/11/17  4:47 PM  Result Value Ref Range Status   C Diff antigen NEGATIVE NEGATIVE Final   C Diff toxin NEGATIVE NEGATIVE Final   C Diff interpretation No C. difficile detected.  Final    Medical History: Past Medical History:  Diagnosis Date  . Anemia   . Asthma   . BRCA positive   . CAD (coronary artery disease)   . COPD (chronic obstructive pulmonary disease) ([place])   . Emphysema lung ([place])   . GERD (gastroesophageal reflux disease)   . History of colon polyps   . Hypercholesterolemia   . Hyperglycemia   . Hypertension   . Lung nodules   . Osteoporosis   . Personal history of tobacco use, presenting hazards to health 11/25/2014  . Polycythemia vera(238.4)   . Renal cyst   . Skin cancer     Assessment: 82 yo female with  Nausea and diarrhea for over a week. Patient reporting 6 pound weight loss in 3 days. Pharmacy has been consulted for electrolyte monitoring and replacement.   Goal of Therapy:  K: 3.5-5 mmol/L Mg:  1.9-2.4 mg/dL Phos: 2.6-4.6 mg/dL  Plan:  10/1 AM :  K: 3.0    Phos: 2.4   Mg: 1.9 Patient is receiving NaCl w/KCL 20mq @ 1083mhr.  Will order KCL 3063mx 2 doses and Phos Neutral Tabs 500m23m2 doses.  Will recheck electrolytes with AM labs and continue to replace as needed.   SheePernell DuprearmD, BCPS Clinical Pharmacist 02/12/2017 1:32 PM

## 2017-02-12 NOTE — Clinical Social Work Note (Signed)
Clinical Social Work Assessment  Patient Details  Name: Tina [name] MRN: [id number] Date of Birth: [birth year]  Date of referral:  02/12/17               Reason for consult:  Grief and Loss, Discharge Planning                Permission sought to share information with:    Permission granted to share information::     Name::        Agency::     Relationship::     Contact Information:     Housing/Transportation Living arrangements for the past 2 months:  Single Family Home Source of Information:  Patient, Adult Children Patient Interpreter Needed:  None Criminal Activity/Legal Involvement Pertinent to Current Situation/Hospitalization:  No - Comment as needed Significant Relationships:  Adult Children Lives with:  Adult Children Do you feel safe going back to the place where you live?  Yes Need for family participation in patient care:  Yes (Comment)  Care giving concerns:  Patient lives in [place] with her son Lake Bells.    Social Worker assessment / plan:  Holiday representative (CSW) received consult that patient recently lost her sister. CSW met with patient and her daughter Festus Holts was at bedside. CSW introduced self and explained role of CSW department. Patient was alert and oriented X4 and was sitting up in the bed. Patient reported that she lives with her son Lake Bells and plans on going home after this hospital stay. Patient reported that her sister recently passed away at [facility]. 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 Patient reported that the doctor at the [facility] was attempting to get her into hospice when she passed away. CSW provided emotional support. Patient is experiencing normal grief and has family to support her during this time. Patient reported no other needs or concerns. CSW will continue to follow and assist as needed.   Employment status:  Retired Forensic scientist:  Medicare PT Recommendations:   Not assessed at this time Information / Referral to community resources:     Patient/Family's Response to care:  Patient reported that she will go home once discharged.   Patient/Family's Understanding of and Emotional Response to Diagnosis, Current Treatment, and Prognosis:  Patient was pleasant and thanked CSW for visit.   Emotional Assessment Appearance:  Appears stated age Attitude/Demeanor/Rapport:    Affect (typically observed):  Accepting, Adaptable, Pleasant Orientation:  Oriented to Self, Oriented to Place, Oriented to  Time, Oriented to Situation Alcohol / Substance use:  Not Applicable Psych involvement (Current and /or in the community):  No (Comment)  Discharge Needs  Concerns to be addressed:  Discharge Planning Concerns Readmission within the last 30 days:  No Current discharge risk:  Dependent with Mobility Barriers to Discharge:  Continued Medical Work up   [employer], Veronia Beets, LCSW 02/12/2017, 4:26 PM

## 2017-02-12 NOTE — Op Note (Addendum)
[facility] Percutaneous Study/Intervention Procedural Note   Date: 02/12/2017  Surgeon(s): Leotis Pain, MD  Assistants: none  Pre-operative Diagnosis: 1.  acute on chronic Mesenteric ischemia 2. celiac artery occlusion, SMA stenosis   Post-operative diagnosis: Same  Procedure(s) Performed: 1. Ultrasound guidance for vascular access right femoral artery  2. Catheter placement into SMA from right femoral approach 3. Aortogram and selective angiogram of the SMA 4. Stent to the SMA with 6 mm diameter x 26 mm length Lifestream balloon expandable stent 5. StarClose closure device right femoral artery  Contrast: 35 cc  Fluoro time: 2.8 minutes  EBL: 5 cc  Anesthesia: Approximately 15 minutes of Moderate conscious sedation using 2 mg of Versed and 100 mcg of Fentanyl  Indications: Patient is a 82 y.o. female who has symptoms consistent with mesenteric ischemia. she has had worsening pain over the past several weeks but became unbearable over the last 24-48 hours. The patient has a CT scan showing severe calcification with likely occlusion of the celiac artery and near occlusive stenosis of the SMA as well as renal artery stenosis and possible IMA stenosis. Addition, she has 2 possible primary malignancies. The patient is brought in for angiography for further evaluation and potential treatment. Risks and benefits are discussed and informed consent is obtained  Procedure: The patient was identified and appropriate procedural time out was performed. The patient was then placed supine on the table and prepped and draped in the usual sterile fashion.Moderate conscious sedation was administered during a face to face encounter with the patient throughout the procedure with my supervision of the RN administering medicines and monitoring the patient's vital signs, pulse oximetry, telemetry and  mental status throughout from the start of the procedure until the patient was taken to the recovery room. Ultrasound was used to evaluate the right common femoral artery. It was patent . A digital ultrasound image was acquired. A Seldinger needle was used to access the right common femoral artery under direct ultrasound guidance and a permanent image was performed. A 0.035 J wire was advanced without resistance and a 5Fr sheath was placed. Pigtail catheter was placed into the aorta and an AP aortogram was performed. This demonstrated a very large chunk of calcium at the level of the visceral vessels with no antegrade flow through the celiac and slow antegrade flow through the SMA. A reasonably large IMA with a large meandering mesenteric artery was seen. It was difficult to discern if there were some stenosis proximally but there did not appear high-grade. There also appeared to be some renal artery stenosis bilaterally on little worse on the left than the right. We transitioned to the lateral projection to image the celiac and SMA. The lateral image demonstrated clear occlusion of the celiac artery with a near occlusive stenosis of 90-95% in the proximal SMA with a very calcific lesion in the aorta. The patient was given 4500 units of IV heparin.We upsized to a 6 Fr sheath.  A VS 1 catheter was used to selectively cannulate the SMA. This demonstrated a proximal high-grade stenosis of the SMA with maintaining distal flow. Based on her symptoms and these findings, I elected to treat the SMA to try to improve the patient's clinical course. she would also be a very poor candidate for open surgical bypass given her advanced age and possible malignancies present. I crossed the lesion without difficulty with Magic torque wire and then advanced a 6 French Pinnacle sheath into the origin of the SMA.  I then used a 6 mm diameter x 26 mm length covered balloon expandable stent to perform treatment of the SMA with care  being taken to avoid the first branch of the SMA and making sure that we were several millimeters back into the aorta. I inflated the balloon to 22 atm. On completion angiogram following this, 10-15% residual stenosis was identified. At this point, I elected to terminate the procedure. The diagnostic catheter was removed. StarClose closure device was deployed in usual fashion with excellent hemostatic result. The patient was taken to the recovery room in stable condition having tolerated the procedure well.     Findings:a very large chunk of calcium at the level of the visceral vessels with no antegrade flow through the celiac and slow antegrade flow through the SMA. A reasonably large IMA with a large meandering mesenteric artery was seen. It was difficult to discern if there were some stenosis proximally but there did not appear high-grade. There also appeared to be some renal artery stenosis bilaterally on little worse on the left than the right. We transitioned to the lateral projection to image the celiac and SMA. The lateral image demonstrated clear occlusion of the celiac artery with a near occlusive stenosis of 90-95% in the proximal SMA with a very calcific lesion in the aorta.  Disposition: Patient was taken to the recovery room in stable condition having tolerated the procedure well.  Complications: None  Leotis Pain 02/12/2017 4:44 PM   This note was created with Dragon Medical transcription system. Any errors in dictation are purely unintentional.

## 2017-02-12 NOTE — Consult Note (Signed)
Tina Lame, MD [facility]  [address] [place], [place] [address] Phone: [phone] Fax : [phone]  Consultation  Referring Provider:     Dr. Posey Pronto  Primary Care Physician:  Einar Pheasant, MD Primary Gastroenterologist:  Dr. Gustavo Lah         Reason for Consultation:     Abnormal CT scan with nausea  Date of Admission:  02/11/2017 Date of Consultation:  02/12/2017         HPI:   Tina [name] is a 82 y.o. female Who is admitted with abdominal pain, Fatigue weakness weight loss and a CT scan in the ER that showed her to have a thickened colon.  The patient has lost about 6 pounds in the last 3 days prior to admission.  The CT scan showed arthrosclerotic occlusion of the abdominal blood vessels and bladder wall thickening consistent with a possible mass and in the ascending colon with a possible mass. The patient had a colonoscopy in 2016 with a polyp found.  The patient also reports that she is nauseous with everything she eats.  The patient has been seen by urology who is planning to look at her bladder tomorrow for a possible neoplasm.  The patient denies any black stools or bloody stools.  The patient also has had a recent loss of her sister which may explain some of the weight loss.  Past Medical History:  Diagnosis Date  . Anemia   . Asthma   . BRCA positive   . CAD (coronary artery disease)   . COPD (chronic obstructive pulmonary disease) ([place])   . Emphysema lung ([place])   . GERD (gastroesophageal reflux disease)   . History of colon polyps   . Hypercholesterolemia   . Hyperglycemia   . Hypertension   . Lung nodules   . Osteoporosis   . Personal history of tobacco use, presenting hazards to health 11/25/2014  . Polycythemia vera(238.4)   . Renal cyst   . Skin cancer     Past Surgical History:  Procedure Laterality Date  . ANGIOPLASTY     coronary (x1)  . COLONOSCOPY WITH PROPOFOL N/A 03/02/2015   Procedure: COLONOSCOPY WITH PROPOFOL;  Surgeon: Lollie Sails, MD;  Location: [facility] ENDOSCOPY;  Service: Endoscopy;  Laterality: N/A;  . CORONARY ANGIOPLASTY    . CORONARY ARTERY BYPASS GRAFT    . SALPINGOOPHORECTOMY    . TONSILECTOMY/ADENOIDECTOMY WITH MYRINGOTOMY    . TUBAL LIGATION      Prior to Admission medications   Medication Sig Start Date End Date Taking? Authorizing Provider  amLODipine (NORVASC) 5 MG tablet TAKE 1 TABLET BY MOUTH  DAILY 02/02/17  Yes Einar Pheasant, MD  cholecalciferol (VITAMIN D) 1000 units tablet Take 1,000 Units by mouth daily.   Yes [provider]  losartan (COZAAR) 100 MG tablet TAKE 1 TABLET BY MOUTH  DAILY 01/04/17  Yes Einar Pheasant, MD  lovastatin (MEVACOR) 40 MG tablet TAKE 1 TABLET BY MOUTH  DAILY 01/04/17  Yes Einar Pheasant, MD  metoprolol tartrate (LOPRESSOR) 25 MG tablet TAKE ONE-HALF TABLET BY  MOUTH TWO TIMES DAILY 01/04/17  Yes Einar Pheasant, MD  ondansetron (ZOFRAN) 4 MG tablet Take 1 tablet (4 mg total) by mouth every 8 (eight) hours as needed for nausea or vomiting. 02/08/17  Yes Einar Pheasant, MD  pantoprazole (PROTONIX) 40 MG tablet Take 1 tablet (40 mg total) by mouth daily. 02/08/17  Yes Einar Pheasant, MD  potassium chloride (K-DUR) 10 MEQ tablet Take  1 tablet twice a day for two days , then take 1 tablet daily then on. 02/09/17  Yes Einar Pheasant, MD  SYMBICORT 160-4.5 MCG/ACT inhaler USE 2 PUFFS TWO TIMES DAILY 02/02/17  Yes Einar Pheasant, MD  triamterene-hydrochlorothiazide (MAXZIDE-25) 37.5-25 MG tablet Take 0.5 tablets by mouth daily. 08/23/16  Yes Gladstone Lighter, MD  albuterol (PROVENTIL HFA;VENTOLIN HFA) 108 (90 Base) MCG/ACT inhaler Inhale 2 puffs into the lungs every 6 (six) hours as needed for wheezing or shortness of breath. 08/23/16   Gladstone Lighter, MD  fluticasone (FLONASE) 50 MCG/ACT nasal spray Place 2 sprays into both nostrils daily. 01/04/17   Einar Pheasant, MD  ipratropium-albuterol (DUONEB) 0.5-2.5 (3) MG/3ML SOLN Take 3 mLs by nebulization every 6  (six) hours as needed (for shortness of breath). 11/14/14   Joanne Gavel, MD    Family History  Problem Relation Age of Onset  . Cirrhosis Father        died age 42  . Alcohol abuse Father   . Asthma Mother   . Congestive Heart Failure Mother   . Breast cancer Mother        2 (1/2 sisters)  . Osteoarthritis Mother   . Colon cancer Mother   . Lupus Sister   . Alcohol abuse Sister   . Ovarian cancer Sister   . Osteoporosis Sister   . Skin cancer Sister   . Breast cancer Cousin   . Breast cancer Maternal Aunt      Social History  Substance Use Topics  . Smoking status: Former Smoker    Packs/day: 1.00    Years: 45.00    Types: Cigarettes    Quit date: 11/11/2003  . Smokeless tobacco: Never Used  . Alcohol use No     Comment: occasional    Allergies as of 02/11/2017 - Review Complete 02/11/2017  Allergen Reaction Noted  . Evista [raloxifene] Other (See Comments) 05/23/2012  . Fluticasone-salmeterol Other (See Comments) 09/05/2014    Review of Systems:    All systems reviewed and negative except where noted in HPI.   Physical Exam:  Vital signs in last 24 hours: Temp:  [97.9 F (36.6 C)-98.5 F (36.9 C)] 98.5 F (36.9 C) (10/01 1446) Pulse Rate:  [71-81] 77 (10/01 1446) Resp:  [16-23] 23 (10/01 1446) BP: (158-178)/(56-61) 168/60 (10/01 1446) SpO2:  [95 %-97 %] 95 % (10/01 1446) Weight:  [126 lb (57.2 kg)] 126 lb (57.2 kg) (09/30 2033) Last BM Date: 02/11/17 General:   Pleasant, cooperative in NAD Head:  Normocephalic and atraumatic. Eyes:   No icterus.   Conjunctiva pink. PERRLA. Ears:  Normal auditory acuity. Neck:  Supple; no masses or thyroidomegaly Lungs: Respirations even and unlabored. Lungs clear to auscultation bilaterally.   No wheezes, crackles, or rhonchi.  Heart:  Regular rate and rhythm;  Without murmur, clicks, rubs or gallops Abdomen:  Soft, nondistended, nontender. Normal bowel sounds. No appreciable masses or hepatomegaly.  No rebound or  guarding.  Rectal:  Not performed. Msk:  Symmetrical without gross deformities.    Extremities:  Without edema, cyanosis or clubbing. Neurologic:  Alert and oriented x3;  grossly normal neurologically. Skin:  Intact without significant lesions or rashes. Cervical Nodes:  No significant cervical adenopathy. Psych:  Alert and cooperative. Normal affect.  LAB RESULTS:  Recent Labs  02/11/17 1516  WBC 26.2*  HGB 15.7  HCT 45.2  PLT 256   BMET  Recent Labs  02/11/17 1516 02/12/17 0309  NA 136 138  K 2.9* 3.0*  CL 96* 102  CO2 27 26  GLUCOSE 162* 143*  BUN 22* 17  CREATININE 1.18* 0.84  CALCIUM 9.4 8.6*   LFT  Recent Labs  02/11/17 1516  PROT 7.5  ALBUMIN 4.0  AST 33  ALT 21  ALKPHOS 63  BILITOT 1.3*   PT/INR No results for input(s): LABPROT, INR in the last 72 hours.  STUDIES: Dg Chest Portable 1 View  Result Date: 02/11/2017 CLINICAL DATA:  Epigastric abdominal pain. EXAM: PORTABLE CHEST 1 VIEW COMPARISON:  Radiographs of August 21, 2016. FINDINGS: The heart size and mediastinal contours are within normal limits. Atherosclerosis of thoracic aorta is noted. No pneumothorax or pleural effusion is noted. Right lung is clear. Mild left basilar subsegmental atelectasis is noted. The visualized skeletal structures are unremarkable. IMPRESSION: Mild left basilar subsegmental atelectasis.  Aortic atherosclerosis. Electronically Signed   By: Marijo Conception, M.D.   On: 02/11/2017 16:34   Ct Angio Abd/pel W/ And/or W/o  Result Date: 02/11/2017 CLINICAL DATA:  Nausea and abdominal pain after eating. Clinical concern for vascular insufficiency. EXAM: CTA ABDOMEN AND PELVIS WITHOUT AND WITH CONTRAST TECHNIQUE: Multidetector CT imaging of the abdomen and pelvis was performed using the standard protocol during bolus administration of intravenous contrast. Multiplanar reconstructed images and MIPs were obtained and reviewed to evaluate the vascular anatomy. CONTRAST:  75 cc Isovue  370 COMPARISON:  Chest, abdomen and pelvis CT dated 01/14/2009. FINDINGS: VASCULAR Aorta: Marked calcified and noncalcified plaque formation in the anterior aspect of the proximal abdominal aorta at the level of the celiac axis and superior mesenteric artery origins. Additional calcified plaque formation elsewhere in the aorta. No aortic aneurysm or dissection seen. 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 There is collateral reconstitution distal to the occlusion. SMA: Marked calcified and noncalcified plaque formation at the origin with complete or near complete occlusion of a 3 mm segment of the proximal superior mesenteric artery. Renals: Single right renal artery with plaque formation at the origin producing approximately 50% stenosis. One anterior and 1 posterior left renal artery with plaque formation at the origin of both. There is approximately 80% stenosis of the proximal portion of the anterior artery at its origin. The posterior artery is completely occluded over a 3 mm long segment proximally. IMA: Calcified and noncalcified plaque formation at the origin with complete or near complete occlusion. Inflow: Mild bilateral iliac calcified plaque formation without significant stenosis on either side. Proximal Outflow: Normal appearing proximal common femoral, femoral and profunda femoral arteries. Veins: No venous thrombosis demonstrated. Review of the MIP images confirms the above findings. NON-VASCULAR Lower chest: Linear atelectasis or scarring at both lung bases. Hepatobiliary: No focal liver abnormality is seen. No gallstones, gallbladder wall thickening, or biliary dilatation. Pancreas: Unremarkable. No pancreatic ductal dilatation or surrounding inflammatory changes. Spleen: Normal in size without focal abnormality. Adrenals/Urinary Tract: Bilateral renal cysts. Mildly irregular and heterogeneous posterior  bladder mass, measuring 4.7 x 3.0 cm on image number 168 of series 4. This measures 3.2 cm in length on sagittal image number 85 series seen. Unremarkable ureters. No urinary tract calculi or hydronephrosis. Mild bilateral adrenal hyperplasia. Stomach/Bowel: Small hiatal hernia. Mild sigmoid colon diverticulosis. Poorly distended ascending colon with some irregular wall thickening. Unremarkable small bowel. Lymphatic: No enlarged lymph nodes. Reproductive: Uterus and bilateral adnexa are unremarkable. Other: No abdominal wall hernia or abnormality. No abdominopelvic ascites. Musculoskeletal: Approximately 20% L2 superior endplate  compression deformity with Schmorl's node formation. No acute fracture lines or bony retropulsion. Mild lumbar and lower thoracic spine degenerative changes. IMPRESSION: VASCULAR 1. Marked densely calcified and noncalcified plaque formation in the anterior aspect of the proximal abdominal aorta at the origins of the celiac axis and superior mesenteric artery. 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 2. Plaque formation causing complete or near complete occlusion of the inferior mesenteric artery at its origin. 3. Plaque formation causing complete occlusion of a 3 mm long segment of the proximal portion of the posterior left renal artery and plaque formation causing approximately 80% stenosis of the proximal portion of the anterior left renal artery. 4. Plaque formation causing approximately 50% stenosis of the origin of the right renal artery. 5. Catheter angiography and stent placement as indicated at that time is recommended. NON-VASCULAR 1. **An incidental finding of potential clinical significance has been found. 4.7 x 3.2 x 3.0 cm posterior bladder mass. This is compatible with a primary bladder malignancy.** 2. **An incidental finding of potential  clinical significance has been found. Irregular wall thickening involving the poorly distended ascending colon segment, suspicious for the possibility of colon cancer. Correlation with colonoscopy is recommended.** 3. Small hiatal hernia. 4. Mild sigmoid diverticulosis. Electronically Signed   By: Claudie Revering M.D.   On: 02/11/2017 17:34      Impression / Plan:   HALLI EQUIHUA is a 82 y.o. y/o female with an abnormal CT scan of the ascending colon and bladder with arthrosclerotic disease and abdominal pain.  The patient has also had loose stools since last Tuesday.  The patient will be set up for an EGD and colonoscopy due to the above symptoms and her nausea. I have discussed risks & benefits which include, but are not limited to, bleeding, infection, perforation & drug reaction.  The patient agrees with this plan & written consent will be obtained.     Thank you for involving me in the care of this patient.      LOS: 1 day   Tina Lame, MD  02/12/2017, 4:37 PM   Note: This dictation was prepared with Dragon dictation along with smaller phrase technology. Any transcriptional errors that result from this process are unintentional.

## 2017-02-12 NOTE — Progress Notes (Signed)
Initial Nutrition Assessment  DOCUMENTATION CODES:   Severe malnutrition in context of acute illness/injury  INTERVENTION:  Once diet able to be advanced, recommend Ensure Enlive po BID, each supplement provides 350 kcal and 20 grams of protein.  NUTRITION DIAGNOSIS:   Malnutrition (Severe) related to acute illness (mesenteric ischemia) as evidenced by 5.7 percent weight loss over 1 week, energy intake < or equal to 50% for > or equal to 5 days, moderate depletion of body fat.  GOAL:   Patient will meet greater than or equal to 90% of their needs  MONITOR:   PO intake, Supplement acceptance, Diet advancement, Labs, Weight trends, I & O's  REASON FOR ASSESSMENT:   Malnutrition Screening Tool    ASSESSMENT:   82 year old female with PMHx of CAD, HTN, hypercholesterolemia, COPD, OP, emphysema, GERD who presented with 1.5 week history of nausea and diarrhea found to have mesenteric ischemia and incidental findings of posterior bladder wall mass and irregular wall thickening of colon suspicious for colon cancer.   Spoke with patient at bedside. She reports that for the past week she has not been able to eat more than bites in setting of pain with eating. She is also experiencing some nausea and diarrhea. She cannot tolerate even applesauce. Patient reports she is NPO today for a vascular procedure and will also have another one tomorrow. She reports she usually eats well and has protein at each meal when feeling well. She is amenable to drinking Ensure when diet able to be advanced.  Patient reports she was 133 lbs recently. Noted 133.6 lbs on 9/17. She has lost 7.6 lbs (5.7% body weight) over the past week, which is significant for time frame.  Medications reviewed and include: vitamin D 1000 units daily, pantoprazole, K Phos Neutral 500 mg BID, potassium chloride 30 mEq Q4hrs, NS @ 75 ml/hr, NS with KCl 40 mEq/L at 100 ml/hr, Unasyn.  Labs reviewed: Potassium 3.  Nutrition-Focused  physical exam completed. Findings are mild-moderate fat depletion (mild depletion of thoracic/lumbar region; moderate region of orbital and upper arm region), no muscle depletion, and no edema.   Discussed with RN. Patient currently NPO for planned vascular procedure.  Diet Order:  DIET SOFT Room service appropriate? Yes; Fluid consistency: Thin Diet NPO time specified  Skin:  Reviewed, no issues  Last BM:  02/11/2017  Height:   Ht Readings from Last 1 Encounters:  02/11/17 5\' 5"  (1.651 m)    Weight:   Wt Readings from Last 1 Encounters:  02/11/17 126 lb (57.2 kg)    Ideal Body Weight:  56.8 kg  BMI:  Body mass index is 20.97 kg/m.  Estimated Nutritional Needs:   Kcal:  1430-1715 (25-30 kcal/kg)  Protein:  70-80 grams (1.2-1.4 grams/kg)  Fluid:  1.4 L/day (25 ml/kg)  EDUCATION NEEDS:   No education needs identified at this time  Willey Blade, MS, RD, LDN Pager: [phone] After Hours Pager: [phone]

## 2017-02-13 ENCOUNTER — Encounter
Admission: EM | Disposition: A | Discharge status: Home Health | Payer: Self-pay | Source: Home / Self Care | Attending: Internal Medicine

## 2017-02-13 ENCOUNTER — Encounter: Payer: Self-pay | Admitting: Vascular Surgery

## 2017-02-13 ENCOUNTER — Inpatient Hospital Stay: Payer: Medicare Other | Admitting: Anesthesiology

## 2017-02-13 DIAGNOSIS — D494 Neoplasm of unspecified behavior of bladder: Secondary | ICD-10-CM | POA: Diagnosis not present

## 2017-02-13 DIAGNOSIS — N135 Crossing vessel and stricture of ureter without hydronephrosis: Secondary | ICD-10-CM | POA: Diagnosis not present

## 2017-02-13 DIAGNOSIS — K529 Noninfective gastroenteritis and colitis, unspecified: Secondary | ICD-10-CM

## 2017-02-13 DIAGNOSIS — K449 Diaphragmatic hernia without obstruction or gangrene: Secondary | ICD-10-CM

## 2017-02-13 DIAGNOSIS — R933 Abnormal findings on diagnostic imaging of other parts of digestive tract: Secondary | ICD-10-CM

## 2017-02-13 DIAGNOSIS — K633 Ulcer of intestine: Secondary | ICD-10-CM

## 2017-02-13 HISTORY — PX: ESOPHAGOGASTRODUODENOSCOPY (EGD) WITH PROPOFOL: SHX5813

## 2017-02-13 HISTORY — PX: UPPER GI ENDOSCOPY: SHX6162

## 2017-02-13 HISTORY — PX: CYSTOSCOPY WITH STENT PLACEMENT: SHX5790

## 2017-02-13 HISTORY — PX: COLONOSCOPY WITH PROPOFOL: SHX5780

## 2017-02-13 HISTORY — PX: TRANSURETHRAL RESECTION OF BLADDER TUMOR: SHX2575

## 2017-02-13 HISTORY — PX: CYSTOSCOPY W/ RETROGRADES: SHX1426

## 2017-02-13 LAB — BASIC METABOLIC PANEL
Anion gap: 5 (ref 5–15)
BUN: 16 mg/dL (ref 6–20)
CHLORIDE: 113 mmol/L — AB (ref 101–111)
CO2: 25 mmol/L (ref 22–32)
CREATININE: 0.88 mg/dL (ref 0.44–1.00)
Calcium: 8.2 mg/dL — ABNORMAL LOW (ref 8.9–10.3)
GFR calc Af Amer: >60 mL/min (ref >60)
GFR calc non Af Amer: >60 mL/min (ref >60)
GLUCOSE: 124 mg/dL — AB (ref 65–99)
Potassium: 4 mmol/L (ref 3.5–5.1)
Sodium: 143 mmol/L (ref 135–145)

## 2017-02-13 LAB — MAGNESIUM: Magnesium: 1.8 mg/dL (ref 1.7–2.4)

## 2017-02-13 SURGERY — ESOPHAGOGASTRODUODENOSCOPY (EGD) WITH PROPOFOL
Anesthesia: General

## 2017-02-13 SURGERY — TURBT (TRANSURETHRAL RESECTION OF BLADDER TUMOR)
Anesthesia: General | Site: Ureter | Wound class: Clean Contaminated

## 2017-02-13 MED ORDER — CEFAZOLIN SODIUM 1 G IJ SOLR
INTRAMUSCULAR | Status: AC
  Filled 2017-02-13: qty 10

## 2017-02-13 MED ORDER — FENTANYL CITRATE (PF) 100 MCG/2ML IJ SOLN
INTRAMUSCULAR | Status: AC
  Filled 2017-02-13: qty 2

## 2017-02-13 MED ORDER — FENTANYL CITRATE (PF) 100 MCG/2ML IJ SOLN
INTRAMUSCULAR | Status: AC
  Administered 2017-02-13: 25 ug via INTRAVENOUS
  Filled 2017-02-13: qty 2

## 2017-02-13 MED ORDER — MITOMYCIN CHEMO FOR BLADDER INSTILLATION 40 MG
40.0000 mg | Freq: Once | INTRAVENOUS | Status: DC
  Filled 2017-02-13: qty 40

## 2017-02-13 MED ORDER — MORPHINE SULFATE (PF) 2 MG/ML IV SOLN
1.0000 mg | Freq: Four times a day (QID) | INTRAVENOUS | Status: DC | PRN
  Administered 2017-02-13 – 2017-02-15 (×4): 1 mg via INTRAVENOUS
  Filled 2017-02-13 (×4): qty 1

## 2017-02-13 MED ORDER — OXYCODONE HCL 5 MG/5ML PO SOLN: 5.0000 mg | Freq: Once | ORAL | Status: DC | PRN

## 2017-02-13 MED ORDER — DEXAMETHASONE SODIUM PHOSPHATE 10 MG/ML IJ SOLN
INTRAMUSCULAR | Status: DC | PRN
  Administered 2017-02-13: 5 mg via INTRAVENOUS

## 2017-02-13 MED ORDER — FLUORESCEIN SODIUM 10 % IV SOLN
INTRAVENOUS | Status: AC
  Filled 2017-02-13: qty 5

## 2017-02-13 MED ORDER — FENTANYL CITRATE (PF) 100 MCG/2ML IJ SOLN
25.0000 ug | INTRAMUSCULAR | Status: DC | PRN
  Administered 2017-02-13 (×2): 25 ug via INTRAVENOUS

## 2017-02-13 MED ORDER — DEXAMETHASONE SODIUM PHOSPHATE 10 MG/ML IJ SOLN
INTRAMUSCULAR | Status: AC
  Filled 2017-02-13: qty 1

## 2017-02-13 MED ORDER — PROMETHAZINE HCL 25 MG/ML IJ SOLN: 6.2500 mg | INTRAMUSCULAR | Status: DC | PRN

## 2017-02-13 MED ORDER — MEPERIDINE HCL 25 MG/ML IJ SOLN: 6.2500 mg | INTRAMUSCULAR | Status: DC | PRN

## 2017-02-13 MED ORDER — LACTATED RINGERS IV SOLN
INTRAVENOUS | Status: DC
  Administered 2017-02-13: 10:00:00 via INTRAVENOUS
  Administered 2017-02-13: 100 mL/h via INTRAVENOUS

## 2017-02-13 MED ORDER — PROPOFOL 500 MG/50ML IV EMUL
INTRAVENOUS | Status: DC | PRN
  Administered 2017-02-13: 75 ug/kg/min via INTRAVENOUS

## 2017-02-13 MED ORDER — LIDOCAINE HCL (CARDIAC) 20 MG/ML IV SOLN
INTRAVENOUS | Status: DC | PRN
  Administered 2017-02-13: 60 mg via INTRAVENOUS

## 2017-02-13 MED ORDER — METOPROLOL TARTRATE 25 MG PO TABS
ORAL_TABLET | ORAL | Status: AC
  Administered 2017-02-13: 12.5 mg via ORAL
  Filled 2017-02-13: qty 1

## 2017-02-13 MED ORDER — PROPOFOL 10 MG/ML IV BOLUS
INTRAVENOUS | Status: DC | PRN
  Administered 2017-02-13: 20 mg via INTRAVENOUS

## 2017-02-13 MED ORDER — LIDOCAINE HCL (PF) 2 % IJ SOLN
INTRAMUSCULAR | Status: AC
Start: 2017-02-13 — End: 2017-02-13
  Filled 2017-02-13: qty 4

## 2017-02-13 MED ORDER — ONDANSETRON HCL 4 MG/2ML IJ SOLN
INTRAMUSCULAR | Status: DC | PRN
  Administered 2017-02-13: 4 mg via INTRAVENOUS

## 2017-02-13 MED ORDER — CEFAZOLIN SODIUM-DEXTROSE 1-4 GM/50ML-% IV SOLN
INTRAVENOUS | Status: DC | PRN
  Administered 2017-02-13: 1 g via INTRAVENOUS

## 2017-02-13 MED ORDER — SEVOFLURANE IN SOLN
RESPIRATORY_TRACT | Status: AC
  Filled 2017-02-13: qty 250

## 2017-02-13 MED ORDER — METOPROLOL TARTRATE 25 MG PO TABS
12.5000 mg | ORAL_TABLET | Freq: Once | ORAL | Status: DC
  Filled 2017-02-13: qty 1

## 2017-02-13 MED ORDER — FLUORESCEIN SODIUM 10 % IV SOLN
INTRAVENOUS | Status: DC | PRN
  Administered 2017-02-13: 1 mL via INTRAVENOUS

## 2017-02-13 MED ORDER — OXYCODONE-ACETAMINOPHEN 5-325 MG PO TABS
1.0000 | ORAL_TABLET | Freq: Four times a day (QID) | ORAL | Status: DC | PRN
  Administered 2017-02-13: 1 via ORAL
  Administered 2017-02-14: 2 via ORAL
  Administered 2017-02-14: 04:00:00 1 via ORAL
  Administered 2017-02-15: 2 via ORAL
  Filled 2017-02-13: qty 1
  Filled 2017-02-13 (×2): qty 2
  Filled 2017-02-13: qty 1

## 2017-02-13 MED ORDER — PROPOFOL 10 MG/ML IV BOLUS
INTRAVENOUS | Status: AC
  Filled 2017-02-13: qty 20

## 2017-02-13 MED ORDER — SUGAMMADEX SODIUM 200 MG/2ML IV SOLN
INTRAVENOUS | Status: AC
  Filled 2017-02-13: qty 2

## 2017-02-13 MED ORDER — OXYCODONE HCL 5 MG PO TABS: 5.0000 mg | ORAL_TABLET | Freq: Once | ORAL | Status: DC | PRN

## 2017-02-13 MED ORDER — SODIUM CHLORIDE 0.9 % IV SOLN
INTRAVENOUS | Status: DC
  Administered 2017-02-13 – 2017-02-14 (×2): via INTRAVENOUS

## 2017-02-13 MED ORDER — SUGAMMADEX SODIUM 200 MG/2ML IV SOLN
INTRAVENOUS | Status: DC | PRN
  Administered 2017-02-13: 120 mg via INTRAVENOUS

## 2017-02-13 MED ORDER — PROPOFOL 10 MG/ML IV BOLUS
INTRAVENOUS | Status: DC | PRN
  Administered 2017-02-13: 100 mg via INTRAVENOUS

## 2017-02-13 MED ORDER — GLYCOPYRROLATE 0.2 MG/ML IJ SOLN
INTRAMUSCULAR | Status: AC
  Filled 2017-02-13: qty 1

## 2017-02-13 MED ORDER — PANTOPRAZOLE SODIUM 40 MG PO TBEC
40.0000 mg | DELAYED_RELEASE_TABLET | Freq: Two times a day (BID) | ORAL | Status: DC
  Administered 2017-02-14 – 2017-02-15 (×3): 40 mg via ORAL
  Filled 2017-02-13 (×3): qty 1

## 2017-02-13 MED ORDER — SODIUM CHLORIDE 0.9 % IV SOLN
INTRAVENOUS | Status: DC
  Administered 2017-02-13: 1000 mL via INTRAVENOUS
  Administered 2017-02-13: 08:00:00 via INTRAVENOUS

## 2017-02-13 MED ORDER — MAGNESIUM OXIDE 400 (241.3 MG) MG PO TABS
400.0000 mg | ORAL_TABLET | Freq: Two times a day (BID) | ORAL | Status: DC
  Administered 2017-02-14 – 2017-02-15 (×3): 400 mg via ORAL
  Filled 2017-02-13 (×3): qty 1

## 2017-02-13 MED ORDER — DEXAMETHASONE SODIUM PHOSPHATE 10 MG/ML IJ SOLN
INTRAMUSCULAR | Status: AC
Start: 2017-02-13 — End: 2017-02-13
  Filled 2017-02-13: qty 1

## 2017-02-13 MED ORDER — ROCURONIUM BROMIDE 100 MG/10ML IV SOLN
INTRAVENOUS | Status: DC | PRN
  Administered 2017-02-13: 20 mg via INTRAVENOUS
  Administered 2017-02-13: 50 mg via INTRAVENOUS

## 2017-02-13 MED ORDER — ROCURONIUM BROMIDE 50 MG/5ML IV SOLN
INTRAVENOUS | Status: AC
  Filled 2017-02-13: qty 1

## 2017-02-13 MED ORDER — ONDANSETRON HCL 4 MG/2ML IJ SOLN
INTRAMUSCULAR | Status: AC
  Filled 2017-02-13: qty 2

## 2017-02-13 MED ORDER — FENTANYL CITRATE (PF) 100 MCG/2ML IJ SOLN
INTRAMUSCULAR | Status: DC | PRN
  Administered 2017-02-13 (×4): 50 ug via INTRAVENOUS

## 2017-02-13 SURGICAL SUPPLY — 31 items
ADAPTER IRRIG TUBE 2 SPIKE SOL (ADAPTER) ×8 IMPLANT
BAG DRAIN CYSTO-URO LG1000N (MISCELLANEOUS) ×4 IMPLANT
BAG URO DRAIN 2000ML W/SPOUT (MISCELLANEOUS) ×4 IMPLANT
CATH FOLEY 2WAY  5CC 16FR (CATHETERS)
CATH FOLEY 3WAY 30CC 22FR (CATHETERS) ×4 IMPLANT
CATH URETL 5X70 OPEN END (CATHETERS) ×4 IMPLANT
CATH URTH 16FR FL 2W BLN LF (CATHETERS) IMPLANT
DRAPE UTILITY 15X26 TOWEL STRL (DRAPES) ×4 IMPLANT
DRSG TELFA 4X3 1S NADH ST (GAUZE/BANDAGES/DRESSINGS) ×4 IMPLANT
ELECT LOOP 22F BIPOLAR SML (ELECTROSURGICAL) ×4
ELECT REM PT RETURN 9FT ADLT (ELECTROSURGICAL)
ELECTRODE LOOP 22F BIPOLAR SML (ELECTROSURGICAL) ×2 IMPLANT
ELECTRODE REM PT RTRN 9FT ADLT (ELECTROSURGICAL) IMPLANT
GLOVE BIO SURGEON STRL SZ 6.5 (GLOVE) ×3 IMPLANT
GLOVE BIO SURGEONS STRL SZ 6.5 (GLOVE) ×1
GOWN STRL REUS W/ TWL LRG LVL3 (GOWN DISPOSABLE) ×4 IMPLANT
GOWN STRL REUS W/TWL LRG LVL3 (GOWN DISPOSABLE) ×4
KIT RM TURNOVER CYSTO AR (KITS) ×4 IMPLANT
LOOP CUT BIPOLAR 24F LRG (ELECTROSURGICAL) IMPLANT
NDL SAFETY ECLIPSE 18X1.5 (NEEDLE) IMPLANT
NEEDLE HYPO 18GX1.5 SHARP (NEEDLE)
PACK CYSTO AR (MISCELLANEOUS) ×4 IMPLANT
SCRUB POVIDONE IODINE 4 OZ (MISCELLANEOUS) ×4 IMPLANT
SENSORWIRE 0.038 NOT ANGLED (WIRE) ×8
SET IRRIG Y TYPE TUR BLADDER L (SET/KITS/TRAYS/PACK) ×4 IMPLANT
SET IRRIGATING DISP (SET/KITS/TRAYS/PACK) ×8 IMPLANT
STENT URET 6FRX24 CONTOUR (STENTS) ×8 IMPLANT
SURGILUBE 2OZ TUBE FLIPTOP (MISCELLANEOUS) ×4 IMPLANT
SYRINGE IRR TOOMEY STRL 70CC (SYRINGE) ×4 IMPLANT
WATER STERILE IRR 1000ML POUR (IV SOLUTION) ×4 IMPLANT
WIRE SENSOR 0.038 NOT ANGLED (WIRE) ×4 IMPLANT

## 2017-02-13 NOTE — Progress Notes (Signed)
PHARMACY CONSULT NOTE - INITIAL   Pharmacy Consult for Electrolyte Monitoring and Management    Allergies  Allergen Reactions  . Evista [Raloxifene] Other (See Comments)    Night sweats Night sweats Reaction:  Night sweats   . Fluticasone-Salmeterol Other (See Comments)    Reaction:  Cough     Patient Measurements: Height: '5\' 5"'  (165.1 cm) Weight: 126 lb (57.2 kg) IBW/kg (Calculated) : 57  Vital Signs: Temp: 97.7 F (36.5 C) (10/02 1407) Temp Source: Oral (10/02 1407) BP: 149/61 (10/02 1407) Pulse Rate: 68 (10/02 1407) Intake/Output from previous day: 10/01 0701 - 10/02 0700 In: 3120 [P.O.:760; I.V.:2210; IV Piggyback:150] Out: -  Intake/Output from this shift: Total I/O In: 1500 [I.V.:1100; Other:400] Out: 615 [Urine:600; Blood:15]  Labs:  Recent Labs  02/11/17 1516 02/12/17 0309 02/13/17 0513  WBC 26.2*  --   --   HGB 15.7  --   --   HCT 45.2  --   --   PLT 256  --   --   CREATININE 1.18* 0.84 0.88  MG  --  1.9 1.8  PHOS  --  2.4*  --   ALBUMIN 4.0  --   --   PROT 7.5  --   --   AST 33  --   --   ALT 21  --   --   ALKPHOS 63  --   --   BILITOT 1.3*  --   --    Estimated Creatinine Clearance: 45.9 mL/min (by C-G formula based on SCr of 0.88 mg/dL).   Microbiology: Recent Results (from the past 720 hour(s))  Urine Culture     Status: None   Collection Time: 02/08/17  3:20 PM  Result Value Ref Range Status   MICRO NUMBER: [id number]  Final   SPECIMEN QUALITY: ADEQUATE  Final   Sample Source BLOOD  Final   STATUS: FINAL  Final   Result:   Final    Multiple organisms present, each less than 10,000 CFU/mL. These organisms, commonly found on external and internal genitalia, are considered to be colonizers. No further testing performed.  Gastrointestinal Panel by PCR , Stool     Status: None   Collection Time: 02/11/17  4:47 PM  Result Value Ref Range Status   Campylobacter species NOT DETECTED NOT DETECTED Final   Plesimonas shigelloides NOT DETECTED  NOT DETECTED Final   Salmonella species NOT DETECTED NOT DETECTED Final   Yersinia enterocolitica NOT DETECTED NOT DETECTED Final   Vibrio species NOT DETECTED NOT DETECTED Final   Vibrio cholerae NOT DETECTED NOT DETECTED Final   Enteroaggregative E coli (EAEC) NOT DETECTED NOT DETECTED Final   Enteropathogenic E coli (EPEC) NOT DETECTED NOT DETECTED Final   Enterotoxigenic E coli (ETEC) NOT DETECTED NOT DETECTED Final   Shiga like toxin producing E coli (STEC) NOT DETECTED NOT DETECTED Final   Shigella/Enteroinvasive E coli (EIEC) NOT DETECTED NOT DETECTED Final   Cryptosporidium NOT DETECTED NOT DETECTED Final   Cyclospora cayetanensis NOT DETECTED NOT DETECTED Final   Entamoeba histolytica NOT DETECTED NOT DETECTED Final   Giardia lamblia NOT DETECTED NOT DETECTED Final   Adenovirus F40/41 NOT DETECTED NOT DETECTED Final   Astrovirus NOT DETECTED NOT DETECTED Final   Norovirus GI/GII NOT DETECTED NOT DETECTED Final   Rotavirus A NOT DETECTED NOT DETECTED Final   Sapovirus (I, II, IV, and V) NOT DETECTED NOT DETECTED Final  C difficile quick scan w PCR reflex     Status: None  Collection Time: 02/11/17  4:47 PM  Result Value Ref Range Status   C Diff antigen NEGATIVE NEGATIVE Final   C Diff toxin NEGATIVE NEGATIVE Final   C Diff interpretation No C. difficile detected.  Final    Medical History: Past Medical History:  Diagnosis Date  . Anemia   . Asthma   . BRCA positive   . CAD (coronary artery disease)   . COPD (chronic obstructive pulmonary disease) ([place])   . Emphysema lung ([place])   . GERD (gastroesophageal reflux disease)   . History of colon polyps   . Hypercholesterolemia   . Hyperglycemia   . Hypertension   . Lung nodules   . Osteoporosis   . Personal history of tobacco use, presenting hazards to health 11/25/2014  . Polycythemia vera(238.4)   . Renal cyst   . Skin cancer     Assessment: 82 yo female with  Nausea and diarrhea for over a week. Patient  reporting 6 pound weight loss in 3 days. Pharmacy has been consulted for electrolyte monitoring and replacement.   Goal of Therapy:  K: 3.5-5 mmol/L Mg: 1.9-2.4 mg/dL Phos: 2.6-4.6 mg/dL  Plan:  10/2 AM :  K: 4.0     Mg: 1.8 Will order MagOx 419m BID x 4 doses. No additional replacement needed at this time.  Will recheck electrolytes with AM labs and continue to replace as needed.   SPernell Dupre PharmD, BCPS Clinical Pharmacist 02/13/2017 3:23 PM

## 2017-02-13 NOTE — Op Note (Signed)
[facility] Patient Name: Tina [name] Procedure Date: 02/13/2017 7:40 AM MRN: [id number] Account #: [id number] Date of Birth: [birth year] Admit Type: Outpatient Age: 82 Room: [facility] ENDO ROOM 4 Gender: Female Note Status: Finalized Procedure:            Upper GI endoscopy Indications:          Nausea Providers:            [name] MD, MD Referring MD:         Einar Pheasant, MD (Referring MD) Medicines:            Propofol per Anesthesia Complications:        No immediate complications. Procedure:            Pre-Anesthesia Assessment:                       - Prior to the procedure, a History and Physical was                        performed, and patient medications and allergies were                        reviewed. The patient's tolerance of previous                        anesthesia was also reviewed. The risks and benefits of                        the procedure and the sedation options and risks were                        discussed with the patient. All questions were                        answered, and informed consent was obtained. Prior                        Anticoagulants: The patient has taken no previous                        anticoagulant or antiplatelet agents. ASA Grade                        Assessment: II - A patient with mild systemic disease.                        After reviewing the risks and benefits, the patient was                        deemed in satisfactory condition to undergo the                        procedure.                       After obtaining informed consent, the endoscope was                        passed under direct vision. Throughout the procedure,  the patient's blood pressure, pulse, and oxygen                        saturations were monitored continuously. The Endoscope                        was introduced through the mouth, and advanced to the                        second  part of duodenum. The upper GI endoscopy was                        accomplished without difficulty. The patient tolerated                        the procedure well. Findings:      A small hiatal hernia was present.      Many oozing cratered gastric ulcers with a visible vessel were found in       the entire examined stomach. Coagulation for hemostasis using argon       plasma at 2 liters/minute and 30 watts was successful. Biopsies were       taken with a cold forceps for histology.      The examined duodenum was normal. Impression:           - Small hiatal hernia.                       - Oozing gastric ulcers with a visible vessel. Treated                        with argon plasma coagulation (APC). Biopsied.                       - Normal examined duodenum. Recommendation:       - Return patient to hospital ward for ongoing care.                       - Resume previous diet.                       - Continue present medications.                       - Use a proton pump inhibitor PO daily. Procedure Code(s):    --- Professional ---                       44967, 13, Esophagogastroduodenoscopy, flexible,                        transoral; with control of bleeding, any method                       43239, Esophagogastroduodenoscopy, flexible, transoral;                        with biopsy, single or multiple Diagnosis Code(s):    --- Professional ---                       R11.0, Nausea  K44.9, Diaphragmatic hernia without obstruction or                        gangrene                       K25.4, Chronic or unspecified gastric ulcer with                        hemorrhage CPT copyright 2016 [employer]. All rights reserved. The codes documented in this report are preliminary and upon coder review may  be revised to meet current compliance requirements. [name] MD, MD 02/13/2017 7:55:51 AM This report has been signed electronically. Number of  Addenda: 0 Note Initiated On: 02/13/2017 7:40 AM      [facility]

## 2017-02-13 NOTE — Anesthesia Preprocedure Evaluation (Signed)
Anesthesia Evaluation  Patient identified by MRN, date of birth, ID band Patient awake    Reviewed: Allergy & Precautions, H&P , NPO status , Patient's Chart, lab work & pertinent test results, reviewed documented beta blocker date and time   Airway Mallampati: II   Neck ROM: full    Dental  (+) Poor Dentition, Teeth Intact   Pulmonary neg pulmonary ROS, asthma , COPD,  COPD inhaler, former smoker,    Pulmonary exam normal        Cardiovascular Exercise Tolerance: Poor hypertension, On Medications + CAD and + Peripheral Vascular Disease  negative cardio ROS Normal cardiovascular exam Rhythm:regular Rate:Normal     Neuro/Psych PSYCHIATRIC DISORDERS negative neurological ROS  negative psych ROS   GI/Hepatic negative GI ROS, Neg liver ROS, GERD  Medicated,  Endo/Other  negative endocrine ROS  Renal/GU Renal diseasenegative Renal ROS  negative genitourinary   Musculoskeletal   Abdominal   Peds  Hematology negative hematology ROS (+) anemia ,   Anesthesia Other Findings Past Medical History: No date: Anemia No date: Asthma No date: BRCA positive No date: CAD (coronary artery disease) No date: COPD (chronic obstructive pulmonary disease) (HCC) No date: Emphysema lung (HCC) No date: GERD (gastroesophageal reflux disease) No date: History of colon polyps No date: Hypercholesterolemia No date: Hyperglycemia No date: Hypertension No date: Lung nodules No date: Osteoporosis 11/25/2014: Personal history of tobacco use, presenting hazards to  health No date: Polycythemia vera(238.4) No date: Renal cyst No date: Skin cancer Past Surgical History: No date: ANGIOPLASTY     Comment:  coronary (x1) 03/02/2015: COLONOSCOPY WITH PROPOFOL; N/A     Comment:  Procedure: COLONOSCOPY WITH PROPOFOL;  Surgeon: Lollie Sails, MD;  Location: [facility] ENDOSCOPY;  Service:               Endoscopy;  Laterality:  N/A; No date: CORONARY ANGIOPLASTY No date: CORONARY ARTERY BYPASS GRAFT No date: SALPINGOOPHORECTOMY No date: TONSILECTOMY/ADENOIDECTOMY WITH MYRINGOTOMY No date: TUBAL LIGATION BMI    Body Mass Index:  20.97 kg/m     Reproductive/Obstetrics negative OB ROS                             Anesthesia Physical Anesthesia Plan  ASA: III and emergent  Anesthesia Plan: General   Post-op Pain Management:    Induction:   PONV Risk Score and Plan:   Airway Management Planned:   Additional Equipment:   Intra-op Plan:   Post-operative Plan:   Informed Consent: I have reviewed the patients History and Physical, chart, labs and discussed the procedure including the risks, benefits and alternatives for the proposed anesthesia with the patient or authorized representative who has indicated his/her understanding and acceptance.   Dental Advisory Given  Plan Discussed with: CRNA  Anesthesia Plan Comments:         Anesthesia Quick Evaluation

## 2017-02-13 NOTE — Interval H&P Note (Signed)
History and Physical Interval Note:  02/13/2017 10:05 AM  [name]  has presented today for surgery, with the diagnosis of n/a  The various methods of treatment have been discussed with the patient and family. After consideration of risks, benefits and other options for treatment, the patient has consented to  Procedure(s): TRANSURETHRAL RESECTION OF BLADDER TUMOR (TURBT) (N/A) as a surgical intervention .  The patient's history has been reviewed, patient examined, no change in status, stable for surgery.  I have reviewed the patient's chart and labs.  Questions were answered to the patient's satisfaction.    Patient seen and examined again this AM.  Plan for TURBT as discussed.  She was given dose of lovenox last evening, greater than 12 hours ago for DVT ppx.    Plan for TURBT, B RTG, possible mitomycin  Hollice Espy

## 2017-02-13 NOTE — Brief Op Note (Signed)
02/11/2017 - 02/13/2017  1:32 PM  PATIENT:  Tina [name]  82 y.o. female  PRE-OPERATIVE DIAGNOSIS:  bladder mass  POST-OPERATIVE DIAGNOSIS:  bladder mass  PROCEDURE:  Procedure(s): TRANSURETHRAL RESECTION OF BLADDER TUMOR (TURBT) (N/A) CYSTOSCOPY WITH RETROGRADE PYELOGRAM (Bilateral) CYSTOSCOPY WITH STENT PLACEMENT (Bilateral)  SURGEON:  Surgeon(s) and Role:    Hollice Espy, MD - Primary  ASSISTANTS: none  ANESTHESIA:   none  EBL:  Total I/O In: 1000 [I.V.:1000] Out: 15 [Blood:15]  Drains: 22 Fr Foley 3 way Foley with catheter  Specimen: bladder tumor  COUNTS CORRECT: YES  PLAN OF CARE: return to floor on cbi  PATIENT DISPOSITION:  PACU - hemodynamically stable.

## 2017-02-13 NOTE — Anesthesia Preprocedure Evaluation (Signed)
Anesthesia Evaluation  Patient identified by MRN, date of birth, ID band Patient awake    Reviewed: Allergy & Precautions, NPO status , Patient's Chart, lab work & pertinent test results  History of Anesthesia Complications Negative for: history of anesthetic complications  Airway Mallampati: II  TM Distance: >3 FB Neck ROM: Full    Dental  (+) Poor Dentition   Pulmonary asthma , neg sleep apnea, COPD,  COPD inhaler, former smoker,    breath sounds clear to auscultation- rhonchi (-) wheezing      Cardiovascular hypertension, (-) angina+ CAD and + Cardiac Stents (15 yrs ago)  (-) CABG  Rhythm:Regular Rate:Normal - Systolic murmurs and - Diastolic murmurs    Neuro/Psych negative neurological ROS  negative psych ROS   GI/Hepatic Neg liver ROS, hiatal hernia, PUD, GERD  ,  Endo/Other  negative endocrine ROSneg diabetes  Renal/GU Renal InsufficiencyRenal disease     Musculoskeletal negative musculoskeletal ROS (+)   Abdominal (+) - obese,   Peds  Hematology  (+) anemia ,   Anesthesia Other Findings Past Medical History: No date: Anemia No date: Asthma No date: BRCA positive No date: CAD (coronary artery disease) No date: COPD (chronic obstructive pulmonary disease) (HCC) No date: Emphysema lung (HCC) No date: GERD (gastroesophageal reflux disease) No date: History of colon polyps No date: Hypercholesterolemia No date: Hyperglycemia No date: Hypertension No date: Lung nodules No date: Osteoporosis 11/25/2014: Personal history of tobacco use, presenting hazards to  health No date: Polycythemia vera(238.4) No date: Renal cyst No date: Skin cancer   Reproductive/Obstetrics                             Anesthesia Physical Anesthesia Plan  ASA: III  Anesthesia Plan: General   Post-op Pain Management:    Induction: Intravenous  PONV Risk Score and Plan: 2 and Ondansetron and  Dexamethasone  Airway Management Planned: LMA  Additional Equipment:   Intra-op Plan:   Post-operative Plan:   Informed Consent: I have reviewed the patients History and Physical, chart, labs and discussed the procedure including the risks, benefits and alternatives for the proposed anesthesia with the patient or authorized representative who has indicated his/her understanding and acceptance.   Dental advisory given  Plan Discussed with: CRNA and Anesthesiologist  Anesthesia Plan Comments:         Anesthesia Quick Evaluation

## 2017-02-13 NOTE — Anesthesia Post-op Follow-up Note (Signed)
Anesthesia QCDR form completed.        

## 2017-02-13 NOTE — Op Note (Signed)
[facility] Gastroenterology Patient Name: Tina [name] Procedure Date: 02/13/2017 7:40 AM MRN: [id number] Account #: [id number] Date of Birth: 1934/08/25 Admit Type: Inpatient Age: 82 Room: [facility] ENDO ROOM 4 Gender: Female Note Status: Finalized Procedure:            Colonoscopy Indications:          Chronic diarrhea, Abnormal CT of the GI tract Providers:            [name] MD, MD Referring MD:         Einar Pheasant, MD (Referring MD) Medicines:            Propofol per Anesthesia Complications:        No immediate complications. Procedure:            Pre-Anesthesia Assessment:                       - Prior to the procedure, a History and Physical was                        performed, and patient medications and allergies were                        reviewed. The patient's tolerance of previous                        anesthesia was also reviewed. The risks and benefits of                        the procedure and the sedation options and risks were                        discussed with the patient. All questions were                        answered, and informed consent was obtained. Prior                        Anticoagulants: The patient has taken no previous                        anticoagulant or antiplatelet agents. ASA Grade                        Assessment: II - A patient with mild systemic disease.                        After reviewing the risks and benefits, the patient was                        deemed in satisfactory condition to undergo the                        procedure.                       After obtaining informed consent, the colonoscope was                        passed under direct vision. Throughout the procedure,  the patient's blood pressure, pulse, and oxygen                        saturations were monitored continuously. The                        Colonoscope was introduced through the anus and         advanced to the the cecum, identified by appendiceal                        orifice and ileocecal valve. The colonoscopy was                        performed without difficulty. The patient tolerated the                        procedure well. The quality of the bowel preparation                        was excellent. Findings:      The perianal and digital rectal examinations were normal.      Discontinuous areas of nonbleeding ulcerated mucosa with no stigmata of       recent bleeding were present at the hepatic flexure, in the ascending       colon and in the cecum. Biopsies were taken with a cold forceps for       histology.      Multiple small-mouthed diverticula were found in the sigmoid colon and       descending colon. Impression:           - Mucosal ulceration. Biopsied.                       - Diverticulosis in the sigmoid colon and in the                        descending colon. Recommendation:       - Return patient to hospital ward for ongoing care.                       - Resume previous diet.                       - Continue present medications.                       - Await pathology results. Procedure Code(s):    --- Professional ---                       [phone], Colonoscopy, flexible; with biopsy, single or                        multiple Diagnosis Code(s):    --- Professional ---                       R93.3, Abnormal findings on diagnostic imaging of other                        parts of digestive tract  K52.9, Noninfective gastroenteritis and colitis,                        unspecified                       K63.3, Ulcer of intestine                       K57.30, Diverticulosis of large intestine without                        perforation or abscess without bleeding CPT copyright 2016 [employer]. All rights reserved. The codes documented in this report are preliminary and upon coder review may  be revised to meet current  compliance requirements. [name] MD, MD 02/13/2017 8:09:23 AM This report has been signed electronically. Number of Addenda: 0 Note Initiated On: 02/13/2017 7:40 AM Scope Withdrawal Time: 0 hours 4 minutes 57 seconds  Total Procedure Duration: 0 hours 9 minutes 51 seconds       [facility]

## 2017-02-13 NOTE — Transfer of Care (Signed)
Immediate Anesthesia Transfer of Care Note  Patient: Tina [name]  Procedure(s) Performed: TRANSURETHRAL RESECTION OF BLADDER TUMOR (TURBT) (N/A Bladder) CYSTOSCOPY WITH RETROGRADE PYELOGRAM (Bilateral Ureter) CYSTOSCOPY WITH STENT PLACEMENT (Bilateral Ureter)  Patient Location: PACU  Anesthesia Type:General  Level of Consciousness: drowsy and patient cooperative  Airway & Oxygen Therapy: Patient Spontanous Breathing and Patient connected to face mask oxygen  Post-op Assessment: Report given to RN and Post -op Vital signs reviewed and stable  Post vital signs: Reviewed and stable  Last Vitals:  Vitals:   02/13/17 0853 02/13/17 1009  BP: (!) 166/57 (!) 154/61  Pulse: 75 76  Resp:  15  Temp:    SpO2: 98% 97%    Last Pain:  Vitals:   02/13/17 1009  TempSrc:   PainSc: 0-No pain      Patients Stated Pain Goal: 0 (70/78/67 5449)  Complications: No apparent anesthesia complications

## 2017-02-13 NOTE — Anesthesia Procedure Notes (Signed)
Procedure Name: Intubation Date/Time: 02/13/2017 10:24 AM Performed by: Silvana Newness Pre-anesthesia Checklist: Patient identified, Emergency Drugs available, Suction available, Patient being monitored and Timeout performed Patient Re-evaluated:Patient Re-evaluated prior to induction Oxygen Delivery Method: Circle system utilized Preoxygenation: Pre-oxygenation with 100% oxygen Induction Type: IV induction Ventilation: Mask ventilation without difficulty Laryngoscope Size: Mac and 3 Grade View: Grade I Tube type: Oral Tube size: 7.0 mm Number of attempts: 1 Airway Equipment and Method: Stylet Placement Confirmation: ETT inserted through vocal cords under direct vision,  positive ETCO2 and breath sounds checked- equal and bilateral Secured at: 20 cm Tube secured with: Tape Dental Injury: Teeth and Oropharynx as per pre-operative assessment

## 2017-02-13 NOTE — Anesthesia Postprocedure Evaluation (Signed)
Anesthesia Post Note  Patient: CONSUELA WIDENER  Procedure(s) Performed: ESOPHAGOGASTRODUODENOSCOPY (EGD) WITH PROPOFOL (N/A ) COLONOSCOPY WITH PROPOFOL (N/A )  Patient location during evaluation: PACU Anesthesia Type: General Level of consciousness: awake and alert Pain management: pain level controlled Vital Signs Assessment: post-procedure vital signs reviewed and stable Respiratory status: spontaneous breathing, nonlabored ventilation, respiratory function stable and patient connected to nasal cannula oxygen Cardiovascular status: blood pressure returned to baseline and stable Postop Assessment: no apparent nausea or vomiting Anesthetic complications: no     Last Vitals:  Vitals:   02/13/17 0713 02/13/17 0812  BP:  (!) 140/56  Pulse:  78  Resp: 16 16  Temp:  (!) 35.7 C  SpO2: 96% 97%    Last Pain:  Vitals:   02/13/17 0812  TempSrc: Tympanic  PainSc:                  Molli Barrows

## 2017-02-13 NOTE — Transfer of Care (Signed)
Immediate Anesthesia Transfer of Care Note  Patient: Tina [name]  Procedure(s) Performed: ESOPHAGOGASTRODUODENOSCOPY (EGD) WITH PROPOFOL (N/A ) COLONOSCOPY WITH PROPOFOL (N/A )  Patient Location: PACU and Endoscopy Unit  Anesthesia Type:General  Level of Consciousness: awake, alert  and oriented  Airway & Oxygen Therapy: Patient Spontanous Breathing  Post-op Assessment: Report given to RN and Post -op Vital signs reviewed and stable  Post vital signs: Reviewed and stable  Last Vitals:  Vitals:   02/13/17 0707 02/13/17 0713  BP: (!) 147/67   Pulse: 68   Resp: 16 16  Temp: 36.4 C   SpO2: 97% 96%    Last Pain:  Vitals:   02/13/17 0707  TempSrc: Tympanic  PainSc:       Patients Stated Pain Goal: 0 (43/60/67 7034)  Complications: No apparent anesthesia complications

## 2017-02-13 NOTE — Progress Notes (Signed)
[facility] NAME: Tina [name]    MR#:  [id number]  DATE OF BIRTH:  07/21/1934  SUBJECTIVE:   Complains of abdominal discomfort REVIEW OF SYSTEMS:   Review of Systems  Constitutional: Negative for chills, fever and weight loss.  HENT: Negative for ear discharge, ear pain and nosebleeds.   Eyes: Negative for blurred vision, pain and discharge.  Respiratory: Negative for sputum production, shortness of breath, wheezing and stridor.   Cardiovascular: Negative for chest pain, palpitations, orthopnea and PND.  Gastrointestinal: Positive for abdominal pain and nausea. Negative for diarrhea and vomiting.  Genitourinary: Negative for frequency and urgency.  Musculoskeletal: Negative for back pain and joint pain.  Neurological: Positive for weakness. Negative for sensory change, speech change and focal weakness.  Psychiatric/Behavioral: Negative for depression and hallucinations. The patient is not nervous/anxious.    Tolerating Diet:npo Tolerating PT: pending  DRUG ALLERGIES:   Allergies  Allergen Reactions  . Evista [Raloxifene] Other (See Comments)    Night sweats Night sweats Reaction:  Night sweats   . Fluticasone-Salmeterol Other (See Comments)    Reaction:  Cough     VITALS:  Blood pressure (!) 149/61, pulse 68, temperature 97.7 F (36.5 C), temperature source Oral, resp. rate 18, height 5\' 5"  (1.651 m), weight 57.2 kg (126 lb), SpO2 97 %.  PHYSICAL EXAMINATION:   Physical Exam  GENERAL:  82 y.o.-year-old patient lying in the bed with no acute distress.thin cachectic  EYES: Pupils equal, round, reactive to light and accommodation. No scleral icterus. Extraocular muscles intact.  HEENT: Head atraumatic, normocephalic. Oropharynx and nasopharynx clear.  NECK:  Supple, no jugular venous distention. No thyroid enlargement, no tenderness.  LUNGS: Normal breath sounds bilaterally, no wheezing, rales, rhonchi. No use  of accessory muscles of respiration.  CARDIOVASCULAR: S1, S2 normal. No murmurs, rubs, or gallops.  ABDOMEN: Soft, nontender, nondistended. Bowel sounds present. No organomegaly or mass.  EXTREMITIES: No cyanosis, clubbing or edema b/l.    NEUROLOGIC: Cranial nerves II through XII are intact. No focal Motor or sensory deficits b/l.   PSYCHIATRIC:  patient is alert and oriented x 3.  SKIN: No obvious rash, lesion, or ulcer.   LABORATORY PANEL:  CBC  Recent Labs Lab 02/11/17 1516  WBC 26.2*  HGB 15.7  HCT 45.2  PLT 256    Chemistries   Recent Labs Lab 02/11/17 1516  02/13/17 0513  NA 136  < > 143  K 2.9*  < > 4.0  CL 96*  < > 113*  CO2 27  < > 25  GLUCOSE 162*  < > 124*  BUN 22*  < > 16  CREATININE 1.18*  < > 0.88  CALCIUM 9.4  < > 8.2*  MG  --   < > 1.8  AST 33  --   --   ALT 21  --   --   ALKPHOS 63  --   --   BILITOT 1.3*  --   --   < > = values in this interval not displayed. Cardiac Enzymes  Recent Labs Lab 02/11/17 1516  TROPONINI 0.03*   RADIOLOGY:  Dg Chest Portable 1 View  Result Date: 02/11/2017 CLINICAL DATA:  Epigastric abdominal pain. EXAM: PORTABLE CHEST 1 VIEW COMPARISON:  Radiographs of August 21, 2016. FINDINGS: The heart size and mediastinal contours are within normal limits. Atherosclerosis of thoracic aorta is noted. No pneumothorax or pleural effusion is noted. Right lung is clear. Mild left  basilar subsegmental atelectasis is noted. The visualized skeletal structures are unremarkable. IMPRESSION: Mild left basilar subsegmental atelectasis.  Aortic atherosclerosis. Electronically Signed   By: Marijo Conception, M.D.   On: 02/11/2017 16:34   Ct Angio Abd/pel W/ And/or W/o  Result Date: 02/11/2017 CLINICAL DATA:  Nausea and abdominal pain after eating. Clinical concern for vascular insufficiency. EXAM: CTA ABDOMEN AND PELVIS WITHOUT AND WITH CONTRAST TECHNIQUE: Multidetector CT imaging of the abdomen and pelvis was performed using the standard  protocol during bolus administration of intravenous contrast. Multiplanar reconstructed images and MIPs were obtained and reviewed to evaluate the vascular anatomy. CONTRAST:  75 cc Isovue 370 COMPARISON:  Chest, abdomen and pelvis CT dated 01/14/2009. FINDINGS: VASCULAR Aorta: Marked calcified and noncalcified plaque formation in the anterior aspect of the proximal abdominal aorta at the level of the celiac axis and superior mesenteric artery origins. Additional calcified plaque formation elsewhere in the aorta. No aortic aneurysm or dissection seen. 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 There is collateral reconstitution distal to the occlusion. SMA: Marked calcified and noncalcified plaque formation at the origin with complete or near complete occlusion of a 3 mm segment of the proximal superior mesenteric artery. Renals: Single right renal artery with plaque formation at the origin producing approximately 50% stenosis. One anterior and 1 posterior left renal artery with plaque formation at the origin of both. There is approximately 80% stenosis of the proximal portion of the anterior artery at its origin. The posterior artery is completely occluded over a 3 mm long segment proximally. IMA: Calcified and noncalcified plaque formation at the origin with complete or near complete occlusion. Inflow: Mild bilateral iliac calcified plaque formation without significant stenosis on either side. Proximal Outflow: Normal appearing proximal common femoral, femoral and profunda femoral arteries. Veins: No venous thrombosis demonstrated. Review of the MIP images confirms the above findings. NON-VASCULAR Lower chest: Linear atelectasis or scarring at both lung bases. Hepatobiliary: No focal liver abnormality is seen. No gallstones, gallbladder wall thickening, or biliary dilatation. Pancreas: Unremarkable. No pancreatic ductal  dilatation or surrounding inflammatory changes. Spleen: Normal in size without focal abnormality. Adrenals/Urinary Tract: Bilateral renal cysts. Mildly irregular and heterogeneous posterior bladder mass, measuring 4.7 x 3.0 cm on image number 168 of series 4. This measures 3.2 cm in length on sagittal image number 85 series seen. Unremarkable ureters. No urinary tract calculi or hydronephrosis. Mild bilateral adrenal hyperplasia. Stomach/Bowel: Small hiatal hernia. Mild sigmoid colon diverticulosis. Poorly distended ascending colon with some irregular wall thickening. Unremarkable small bowel. Lymphatic: No enlarged lymph nodes. Reproductive: Uterus and bilateral adnexa are unremarkable. Other: No abdominal wall hernia or abnormality. No abdominopelvic ascites. Musculoskeletal: Approximately 20% L2 superior endplate compression deformity with Schmorl's node formation. No acute fracture lines or bony retropulsion. Mild lumbar and lower thoracic spine degenerative changes. IMPRESSION: VASCULAR 1. Marked densely calcified and noncalcified plaque formation in the anterior aspect of the proximal abdominal aorta at the origins of the celiac axis and superior mesenteric artery. 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 2. Plaque formation causing complete or near complete occlusion of the inferior mesenteric artery at its origin. 3. Plaque formation causing complete occlusion of a 3 mm long segment of the proximal portion of the posterior left renal artery and plaque  formation causing approximately 80% stenosis of the proximal portion of the anterior left renal artery. 4. Plaque formation causing approximately 50% stenosis of the origin of the right renal artery. 5. Catheter angiography and stent placement as indicated at that time is recommended. NON-VASCULAR 1. **An incidental finding of  potential clinical significance has been found. 4.7 x 3.2 x 3.0 cm posterior bladder mass. This is compatible with a primary bladder malignancy.** 2. **An incidental finding of potential clinical significance has been found. Irregular wall thickening involving the poorly distended ascending colon segment, suspicious for the possibility of colon cancer. Correlation with colonoscopy is recommended.** 3. Small hiatal hernia. 4. Mild sigmoid diverticulosis. Electronically Signed   By: Claudie Revering M.D.   On: 02/11/2017 17:34   ASSESSMENT AND PLAN:  Rheagan Nayak  is a 82 y.o. female with a known history of asthma, COPD/emphysema, history of colon polyps with last colonoscopy 2016 showed sigmoid diverticulosis comes to the emergency room with increasing nausea and diarrhea for last week and a half. Patient reports feeling fatigued and malaise and loss of weight about 6 pounds and to 3 days. She is unable to eat much.  1. Nausea, diarrhea, weight loss with malaise and fatigue and abdominal CAT scan suggestive of atherosclerotic abdominal vessels with suggestion of possible ischemic colitis, irregular wall thickening involving the ascending colon suspicious for possibility of colon cancer and incidental note of 4.7 x 3.2 cm posterior bladder wall mass -Vascular surgery consultation with Dr. Lucky Cowboy appreciated. -patient is s/p abdominal angiogram with Stent to the SMA with 6 mm diameter x 26 mm length Lifestream balloon expandable stent by Dr dew on 02/12/17 -IV fluids -Empiric IV Unasyn  2. Incidental posterior bladder wall mass -Urology consultation with Dr. Erlene Quan appreciated. -patient is s/p TURBT  With bilateral ureteral stent placement by Dr Erlene Quan 02/13/17 --Urology recommends CBI for now  3. Irregular thickening of descending colon with symptoms of malaise fatigue diarrhea and nausea and weight loss -GI consult with Dr. Allen Norris appreciated -Patient is s/p EGD - Small hiatal hernia.- Oozing gastric ulcers  with a visible vessel. Treat  with argon plasma coagulation (APC).  -s/p colonoscopy- showed Mucosal ulceration.- Diverticulosis in the sigmoid colon and in the descending colon.  -IV unasyn  4. Hypokalemia Suspected due to GI losses and due to diuretic -repeat IV and oral potassium  5.Leukocytosis suspected due to #1 -Empiric Unasyn  6. DVT prophylaxis subcutaneous Lovenox  CODE STATUS discussed with patient. Patient requests/wishes DO NOT RESUSCITATE  Case discussed with Care Management/Social Worker. Management plans discussed with the patient, family and they are in agreement.  CODE STATUS: DNR  DVT Prophylaxis: lovenox  TOTAL TIME TAKING CARE OF THIS PATIENT: *30* minutes.  >50% time spent on counselling and coordination of care  POSSIBLE D/C IN 2-3* DAYS, DEPENDING ON CLINICAL CONDITION.  Note: This dictation was prepared with Dragon dictation along with smaller phrase technology. Any transcriptional errors that result from this process are unintentional.  Kenidee Cregan M.D on 02/13/2017 at 2:19 PM  Between 7am to 6pm - Pager - [phone]  After 6pm go to [email or web address] - password [email or web address]  Office  [phone]  CC: Primary care physician; Einar Pheasant, MDPatient ID: Tina [name], female   DOB: [birth year], 82 y.o.   MRN: [id number]

## 2017-02-13 NOTE — Progress Notes (Signed)
NPO in endo recovery. Back to room 125 after report called.

## 2017-02-13 NOTE — Anesthesia Postprocedure Evaluation (Signed)
Anesthesia Post Note  Patient: Tina [name]  Procedure(s) Performed: TRANSURETHRAL RESECTION OF BLADDER TUMOR (TURBT) (N/A Bladder) CYSTOSCOPY WITH RETROGRADE PYELOGRAM (Bilateral Ureter) CYSTOSCOPY WITH STENT PLACEMENT (Bilateral Ureter)  Patient location during evaluation: PACU Anesthesia Type: General Level of consciousness: awake and alert and oriented Pain management: pain level controlled Vital Signs Assessment: post-procedure vital signs reviewed and stable Respiratory status: spontaneous breathing, nonlabored ventilation and respiratory function stable Cardiovascular status: blood pressure returned to baseline and stable Postop Assessment: no signs of nausea or vomiting Anesthetic complications: no     Last Vitals:  Vitals:   02/13/17 1328 02/13/17 1337  BP: (!) 158/66 (!) 158/65  Pulse: 66 65  Resp: 18 13  Temp:  36.9 C  SpO2: 94% 96%    Last Pain:  Vitals:   02/13/17 1337  TempSrc:   PainSc: 3                  Corran Lalone

## 2017-02-13 NOTE — H&P (View-Only) (Signed)
 Urology Consult  I have been asked to see the patient by Dr. Patel, for evaluation and management of bladder mass.  Chief Complaint: abdominal pain  History of Present Illness: Tina [name] is a 82 y.o. year old former smoker who is admitted by the hospitalist service yesterday for postprandial abdominal pain felt to be related to mesenteric bowel ischemia.  As part of her workup, she underwent CT angiogram of the abdomen and pelvis with contrast yesterday revealing heterogeneous posterior bladder wall mass measuring 4.7 x 3 cm without any hydronephrosis or lymphadenopathy.  Also notable and CT was poorly distended ascending colon with some irregular wall thickening. She is scheduled for colonoscopy in the morning with Dr. Wohl.  She does admit today that she had pressure in her bladder last week along with an episode of gross hematuria.   She was seen and evaluated by her PCP at which time incidental microscopic hematuria was noted in her blood.  Urine culture grew 10,000 colonies of mixed flora.  She is a former smoker, quit approximately 15 years ago. She does have COPD.  She was previously followed by Dr. Cope and Shannon McGowan for bilateral renal cysts which were benign.   Most recent bladder imaging in the form of renal ultrasound in 2016 without evidence of bladder lesion at the time.     Past Medical History:  Diagnosis Date  . Anemia   . Asthma   . BRCA positive   . CAD (coronary artery disease)   . COPD (chronic obstructive pulmonary disease) (HCC)   . Emphysema lung (HCC)   . GERD (gastroesophageal reflux disease)   . History of colon polyps   . Hypercholesterolemia   . Hyperglycemia   . Hypertension   . Lung nodules   . Osteoporosis   . Personal history of tobacco use, presenting hazards to health 11/25/2014  . Polycythemia vera(238.4)   . Renal cyst   . Skin cancer     Past Surgical History:  Procedure Laterality Date  . ANGIOPLASTY     coronary (x1)  .  COLONOSCOPY WITH PROPOFOL N/A 03/02/2015   Procedure: COLONOSCOPY WITH PROPOFOL;  Surgeon: Martin U Skulskie, MD;  Location: [facility] ENDOSCOPY;  Service: Endoscopy;  Laterality: N/A;  . CORONARY ANGIOPLASTY    . CORONARY ARTERY BYPASS GRAFT    . SALPINGOOPHORECTOMY    . TONSILECTOMY/ADENOIDECTOMY WITH MYRINGOTOMY    . TUBAL LIGATION      Home Medications:  Current Meds  Medication Sig  . amLODipine (NORVASC) 5 MG tablet TAKE 1 TABLET BY MOUTH  DAILY  . cholecalciferol (VITAMIN D) 1000 units tablet Take 1,000 Units by mouth daily.  . losartan (COZAAR) 100 MG tablet TAKE 1 TABLET BY MOUTH  DAILY  . lovastatin (MEVACOR) 40 MG tablet TAKE 1 TABLET BY MOUTH  DAILY  . metoprolol tartrate (LOPRESSOR) 25 MG tablet TAKE ONE-HALF TABLET BY  MOUTH TWO TIMES DAILY  . ondansetron (ZOFRAN) 4 MG tablet Take 1 tablet (4 mg total) by mouth every 8 (eight) hours as needed for nausea or vomiting.  . pantoprazole (PROTONIX) 40 MG tablet Take 1 tablet (40 mg total) by mouth daily.  . potassium chloride (K-DUR) 10 MEQ tablet Take 1 tablet twice a day for two days , then take 1 tablet daily then on.  . SYMBICORT 160-4.5 MCG/ACT inhaler USE 2 PUFFS TWO TIMES DAILY  . triamterene-hydrochlorothiazide (MAXZIDE-25) 37.5-25 MG tablet Take 0.5 tablets by mouth daily.    Allergies:  Allergies  Allergen   Reactions  . Evista [Raloxifene] Other (See Comments)    Night sweats Night sweats Reaction:  Night sweats   . Fluticasone-Salmeterol Other (See Comments)    Reaction:  Cough     Family History  Problem Relation Age of Onset  . Cirrhosis Father        died age 52  . Alcohol abuse Father   . Asthma Mother   . Congestive Heart Failure Mother   . Breast cancer Mother        2 (1/2 sisters)  . Osteoarthritis Mother   . Colon cancer Mother   . Lupus Sister   . Alcohol abuse Sister   . Ovarian cancer Sister   . Osteoporosis Sister   . Skin cancer Sister   . Breast cancer Cousin   . Breast cancer Maternal  Aunt     Social History:  reports that she quit smoking about 13 years ago. Her smoking use included Cigarettes. She has a 45.00 pack-year smoking history. She has never used smokeless tobacco. She reports that she does not drink alcohol or use drugs.  ROS: A complete review of systems was performed.  All systems are negative except for pertinent findings as noted.  Physical Exam:  Vital signs in last 24 hours: Temp:  [98 F (36.7 C)-98.4 F (36.9 C)] 98.4 F (36.9 C) (10/01 0409) Pulse Rate:  [71-81] 71 (10/01 0409) Resp:  [16-18] 18 (10/01 0409) BP: (158-178)/(56-100) 162/56 (10/01 0409) SpO2:  [95 %-100 %] 96 % (10/01 0409) Weight:  [126 lb (57.2 kg)] 126 lb (57.2 kg) (09/30 2033) Constitutional:  Alert and oriented, No acute distress HEENT: [place] AT, moist mucus membranes.  Trachea midline, no masses Cardiovascular: Regular rate and rhythm, no clubbing, cyanosis, or edema. Respiratory: Normal respiratory effort, lungs clear bilaterally GI: Abdomen is soft, nontender, nondistended, no abdominal masses GU: No CVA tenderness Skin: No rashes, bruises or suspicious lesions Lymph: No cervical or inguinal adenopathy Neurologic: Grossly intact, no focal deficits, moving all 4 extremities Psychiatric: Normal mood and affect   Laboratory Data:   Recent Labs  02/11/17 1516  WBC 26.2*  HGB 15.7  HCT 45.2    Recent Labs  02/11/17 1516 02/12/17 0309  NA 136 138  K 2.9* 3.0*  CL 96* 102  CO2 27 26  GLUCOSE 162* 143*  BUN 22* 17  CREATININE 1.18* 0.84  CALCIUM 9.4 8.6*   No results for input(s): LABPT, INR in the last 72 hours. No results for input(s): LABURIN in the last 72 hours. Results for orders placed or performed during the hospital encounter of 02/11/17  Gastrointestinal Panel by PCR , Stool     Status: None   Collection Time: 02/11/17  4:47 PM  Result Value Ref Range Status   Campylobacter species NOT DETECTED NOT DETECTED Final   Plesimonas shigelloides NOT  DETECTED NOT DETECTED Final   Salmonella species NOT DETECTED NOT DETECTED Final   Yersinia enterocolitica NOT DETECTED NOT DETECTED Final   Vibrio species NOT DETECTED NOT DETECTED Final   Vibrio cholerae NOT DETECTED NOT DETECTED Final   Enteroaggregative E coli (EAEC) NOT DETECTED NOT DETECTED Final   Enteropathogenic E coli (EPEC) NOT DETECTED NOT DETECTED Final   Enterotoxigenic E coli (ETEC) NOT DETECTED NOT DETECTED Final   Shiga like toxin producing E coli (STEC) NOT DETECTED NOT DETECTED Final   Shigella/Enteroinvasive E coli (EIEC) NOT DETECTED NOT DETECTED Final   Cryptosporidium NOT DETECTED NOT DETECTED Final   Cyclospora cayetanensis NOT DETECTED   NOT DETECTED Final   Entamoeba histolytica NOT DETECTED NOT DETECTED Final   Giardia lamblia NOT DETECTED NOT DETECTED Final   Adenovirus F40/41 NOT DETECTED NOT DETECTED Final   Astrovirus NOT DETECTED NOT DETECTED Final   Norovirus GI/GII NOT DETECTED NOT DETECTED Final   Rotavirus A NOT DETECTED NOT DETECTED Final   Sapovirus (I, II, IV, and V) NOT DETECTED NOT DETECTED Final  C difficile quick scan w PCR reflex     Status: None   Collection Time: 02/11/17  4:47 PM  Result Value Ref Range Status   C Diff antigen NEGATIVE NEGATIVE Final   C Diff toxin NEGATIVE NEGATIVE Final   C Diff interpretation No C. difficile detected.  Final     Radiologic Imaging: Dg Chest Portable 1 View  Result Date: 02/11/2017 CLINICAL DATA:  Epigastric abdominal pain. EXAM: PORTABLE CHEST 1 VIEW COMPARISON:  Radiographs of August 21, 2016. FINDINGS: The heart size and mediastinal contours are within normal limits. Atherosclerosis of thoracic aorta is noted. No pneumothorax or pleural effusion is noted. Right lung is clear. Mild left basilar subsegmental atelectasis is noted. The visualized skeletal structures are unremarkable. IMPRESSION: Mild left basilar subsegmental atelectasis.  Aortic atherosclerosis. Electronically Signed   By: James  Green Jr,  M.D.   On: 02/11/2017 16:34   Ct Angio Abd/pel W/ And/or W/o  Result Date: 02/11/2017 CLINICAL DATA:  Nausea and abdominal pain after eating. Clinical concern for vascular insufficiency. EXAM: CTA ABDOMEN AND PELVIS WITHOUT AND WITH CONTRAST TECHNIQUE: Multidetector CT imaging of the abdomen and pelvis was performed using the standard protocol during bolus administration of intravenous contrast. Multiplanar reconstructed images and MIPs were obtained and reviewed to evaluate the vascular anatomy. CONTRAST:  75 cc Isovue 370 COMPARISON:  Chest, abdomen and pelvis CT dated 01/14/2009. FINDINGS: VASCULAR Aorta: Marked calcified and noncalcified plaque formation in the anterior aspect of the proximal abdominal aorta at the level of the celiac axis and superior mesenteric artery origins. Additional calcified plaque formation elsewhere in the aorta. No aortic aneurysm or dissection seen. 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 There is collateral reconstitution distal to the occlusion. SMA: Marked calcified and noncalcified plaque formation at the origin with complete or near complete occlusion of a 3 mm segment of the proximal superior mesenteric artery. Renals: Single right renal artery with plaque formation at the origin producing approximately 50% stenosis. One anterior and 1 posterior left renal artery with plaque formation at the origin of both. There is approximately 80% stenosis of the proximal portion of the anterior artery at its origin. The posterior artery is completely occluded over a 3 mm long segment proximally. IMA: Calcified and noncalcified plaque formation at the origin with complete or near complete occlusion. Inflow: Mild bilateral iliac calcified plaque formation without significant stenosis on either side. Proximal Outflow: Normal appearing proximal common femoral, femoral and profunda femoral  arteries. Veins: No venous thrombosis demonstrated. Review of the MIP images confirms the above findings. NON-VASCULAR Lower chest: Linear atelectasis or scarring at both lung bases. Hepatobiliary: No focal liver abnormality is seen. No gallstones, gallbladder wall thickening, or biliary dilatation. Pancreas: Unremarkable. No pancreatic ductal dilatation or surrounding inflammatory changes. Spleen: Normal in size without focal abnormality. Adrenals/Urinary Tract: Bilateral renal cysts. Mildly irregular and heterogeneous posterior bladder mass, measuring 4.7 x 3.0 cm on image number 168 of series 4. This measures 3.2 cm in length on sagittal image   number 85 series seen. Unremarkable ureters. No urinary tract calculi or hydronephrosis. Mild bilateral adrenal hyperplasia. Stomach/Bowel: Small hiatal hernia. Mild sigmoid colon diverticulosis. Poorly distended ascending colon with some irregular wall thickening. Unremarkable small bowel. Lymphatic: No enlarged lymph nodes. Reproductive: Uterus and bilateral adnexa are unremarkable. Other: No abdominal wall hernia or abnormality. No abdominopelvic ascites. Musculoskeletal: Approximately 20% L2 superior endplate compression deformity with Schmorl's node formation. No acute fracture lines or bony retropulsion. Mild lumbar and lower thoracic spine degenerative changes. IMPRESSION: VASCULAR 1. Marked densely calcified and noncalcified plaque formation in the anterior aspect of the proximal abdominal aorta at the origins of the celiac axis and superior mesenteric artery. 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 2. Plaque formation causing complete or near complete occlusion of the inferior mesenteric artery at its origin. 3. Plaque formation causing complete occlusion of a 3 mm long segment of the proximal portion of the posterior left renal  artery and plaque formation causing approximately 80% stenosis of the proximal portion of the anterior left renal artery. 4. Plaque formation causing approximately 50% stenosis of the origin of the right renal artery. 5. Catheter angiography and stent placement as indicated at that time is recommended. NON-VASCULAR 1. **An incidental finding of potential clinical significance has been found. 4.7 x 3.2 x 3.0 cm posterior bladder mass. This is compatible with a primary bladder malignancy.** 2. **An incidental finding of potential clinical significance has been found. Irregular wall thickening involving the poorly distended ascending colon segment, suspicious for the possibility of colon cancer. Correlation with colonoscopy is recommended.** 3. Small hiatal hernia. 4. Mild sigmoid diverticulosis. Electronically Signed   By: Steven  Reid M.D.   On: 02/11/2017 17:34    Impression/Plan:  1. Bladder mass - based on appearance on CT scan and microscopic hematuria, highly suspicious for primary bladder malignancy. If highly recommended proceeding for TURBT, bilateral retrograde pyelogram, possible instillation of intravesical mitomycin ASAP both for therapeutic and staging purposes. The setting of bowel ischemia and possible need for anticoagulation the future, I do feel that resecting this tumor prior to initiation of anticoagulation will reduce her risk of hematuria and intolerance of the medication. We discussed the natural history of bladder cancer in detail. Her daughter was at the bedside today. All questions were answered. We'll tentatively add her onto the schedule to mark, discussed with Dr. Wohl who is agreed to do her colonoscopy as first case with urology procedure to follow.   2. Acute kidney injury- Cr slightly elevated, no obvious hydronephrosis as contributing factor.    3. Colon mass-discussed with Dr. Wohl as above. Scheduled for colonoscopy in a.m. prior to TURBT.   4. Bowel ischemia- please  hold off on anticoagulation until following TURBT procedure if reasonably appropriate from a medical standpoint.   02/12/2017, 11:20 AM  Tina Landen,  MD        

## 2017-02-14 ENCOUNTER — Telehealth: Payer: Self-pay | Admitting: Urology

## 2017-02-14 ENCOUNTER — Encounter: Payer: Self-pay | Admitting: Gastroenterology

## 2017-02-14 LAB — CBC
HCT: 38.8 % (ref 35.0–47.0)
HEMOGLOBIN: 13 g/dL (ref 12.0–16.0)
MCH: 30.6 pg (ref 26.0–34.0)
MCHC: 33.4 g/dL (ref 32.0–36.0)
MCV: 91.5 fL (ref 80.0–100.0)
Platelets: 218 10*3/uL (ref 150–440)
RBC: 4.24 MIL/uL (ref 3.80–5.20)
RDW: 13.5 % (ref 11.5–14.5)
WBC: 18.8 10*3/uL — ABNORMAL HIGH (ref 3.6–11.0)

## 2017-02-14 LAB — POTASSIUM: Potassium: 4.1 mmol/L (ref 3.5–5.1)

## 2017-02-14 LAB — SURGICAL PATHOLOGY

## 2017-02-14 MED ORDER — AMOXICILLIN-POT CLAVULANATE 875-125 MG PO TABS
1.0000 | ORAL_TABLET | Freq: Two times a day (BID) | ORAL | Status: DC
  Administered 2017-02-14 – 2017-02-15 (×3): 1 via ORAL
  Filled 2017-02-14 (×3): qty 1

## 2017-02-14 NOTE — Telephone Encounter (Signed)
There is no place to put this patient on for next week and he can't do a morning next week because she already has appointments in the morning when you are here so I wouldn't be able to double book you anyway. Where would you like me to add her?   Thanks, Sharyn Lull

## 2017-02-14 NOTE — Op Note (Signed)
Date of procedure: 02/13/17  Preoperative diagnosis:  1. Bladder mass   Postoperative diagnosis:  1. Bladder mass 2. Bilateral ureteral obstruction   Procedure: 1. TURBT, large, greater than 5 cm 2. Bilateral retrograde pyelogram 3. Bilateral ureteral stent placement  Surgeon: Hollice Espy, MD  Anesthesia: General  Complications: None  Intraoperative findings: Bimanual exam revealed a nodular mass involving the trigone/anterior vaginal wall but no fixed pelvis. Tumor appeared necrotic, nodular, and irregular involving the entire trigone encompassing the ureteral orifice season requiring resection of these. Bilateral ureteral stents placed. Debulking procedure performed today, suspect residual tumor remains.    EBL: minimal  Specimens: Bladder tumor  Drains: 22 French three-way Foley catheter, bilateral 6 x 24 French double-J ureteral stent  Indication: Tina [name] is a 82 y.o. patient with mesenteric bowel ischemia and underwent CT abdomen pelvis for further workup of this. Incidentally, she did have a large bladder mass and reports an episode of gross hematuria last week..  After reviewing the management options for treatment, she elected to proceed with the above surgical procedure(s). We have discussed the potential benefits and risks of the procedure, side effects of the proposed treatment, the likelihood of the patient achieving the goals of the procedure, and any potential problems that might occur during the procedure or recuperation. Informed consent has been obtained.  Description of procedure:  The patient was taken to the operating room and general anesthesia was induced.  The patient was placed in the dorsal lithotomy position, prepped and draped in the usual sterile fashion, and preoperative antibiotics were administered. A preoperative time-out was performed.   Prior to beginning the procedure, bimanual exam was performed. Palpation of the anterior vaginal wall,  there was noted to be at least a 5 cm firm nodular mass which appeared to be involving the posterior wall/trigone of the bladder. Overall, the pelvis was firm but not fixed. There is no clear erosion of this mass into the vaginal wall.  A 21 French scope was advanced per urethra into the bladder. Immediately, a large amount of nodular necrotic tissue was noted to be involving the majority of the trigone. Is quite friable and visualization was poor. The scope was then almost immediately she tried 75 [place] resectoscope.  The lesion measured at least 5 cm if not larger and occupied the trigone as well as the posterior bladder wall. Careful inspection of the remainder the bladder was fairly unremarkable although due to bleeding, visualization was difficult. The ureteral orifices were unable to be identified due to distortion from the tumor. At this point time, using saline as a medium, bipolar loop was used to start to resect the tumor. Due to the amount of tissue, the bladder had to be evacuated of tumor chips on multiple occasions.Eventually, was able to find the posterior edge of the tumor as it extended up the wall bladder.  This level, normal bladder muscularis was identified and the resection was somewhat deeper patient sitting small mass fat. The abdomen remained soft.  The dissection was then carried across the remainder of the trigone. Tumor is carried down to a base layer and no longer had a mounded appearance although there was suggestion of residual tumor. Upon resection of the mass, to come across 2 areas felt to be representative of the [place]. To confirm this, IV fluorescein was given which confirmed reflux from each of these sites. Using a laser bridge and a 5 [place] open-ended ureteral catheter, retrograde pyelogram. On the right, appeared that the  obstruction was higher grade and the distal ureter was relatively capacious. Contrast did not reflux all the way up to the proximal collecting system.  On  the left, retrograde pyelogram was fairly unremarkable except for the UVJ where the ureter appeared somewhat narrowed. Partial placed in each of the orifices up to level of the renal pelvises. The end of the procedure, bilateral ureteral stents were placed under fluoroscopic guidance with coils in the renal pelvis as well as bladder.  This point time, careful hemostasis was achieved. There appeared to be small amount of residual tumor. Overall, estimated 95% of the tumor was resected but certainly some amount remained. Bimanual exam at the end of the procedure revealed softening of the mass without any obvious palpable disease residual. The bladder was then drained. A three-way 25 French Foley catheter was in place. The balloon was filled with 30 cc of sterile water. Slow drip CBI was initiated due to concern for postoperative bleeding. She was then cleaned and dried, repositioned the supine position, reversed from anesthesia, taken to the PACU in stable condition.  Plan: Patient will remain in-house overnight for continuous bladder irrigation. Based on her pathology results, we will determine how to proceed whether we will return for reresection also need more extensive intervention such as chemotherapy/ cystectomy.    Hollice Espy, M.D.

## 2017-02-14 NOTE — Evaluation (Signed)
Physical Therapy Evaluation Patient Details Name: Tina [name] MRN: [id number] DOB: 01/08/1935 Today's Date: 02/14/2017   History of Present Illness  presented to ER seconadry to nausea/vomiting/diarrhead and worsening abdominal pain x2 weeks; admitted with abdominal pain related to atherosclerotic occlusion/mesenteric ischemia with incidental finding of bladder and ascending colon mass.  Status post aortogram, angiogram and SMA stent (10/1), endoscopy with gastric ulcers (treated during procedure), colonscopy with mucusal ulceration, diverticulitis and TURBT (10/2) with bilat ureteral stenting.  Clinical Impression  Upon evaluation, patient alert and oriented; follows all commands and demonstrates good effort with all therapeutic activities.  Bilat UE/LE strength and ROM grossly symmetrical and WFL; no focal weakness appreciated.  Demonstrates ability to complete bed mobility with mod indep; sit/stand, basic transfers and gait (220' x2) without assist device, cga/min assist.  Initial sway, LOB with dynamic gait components, but appears to improve into gait distance.  10' walk time completed in 7-8 seconds; slightly decreased compared to age-matched norms, but patient with good insight and good use of compensatory strategies.  Firmly refusing trial of assist device at this time.  Did encouraged continued mobility with nursing outside of therapy to promote optimal recovery during remaining hospitalization; patient voiced understanding and agreement. Would benefit from skilled PT to address above deficits and promote optimal return to PLOF; Recommend transition to [place] upon discharge from acute hospitalization.     Follow Up Recommendations Home health PT    Equipment Recommendations       Recommendations for Other Services       Precautions / Restrictions Precautions Precautions: Fall Restrictions Weight Bearing Restrictions: No      Mobility  Bed Mobility Overal bed mobility: Modified  Independent                Transfers Overall transfer level: Needs assistance Equipment used: None Transfers: Sit to/from Stand Sit to Stand: Supervision         General transfer comment: fair/good LE strength and power with movement transition  Ambulation/Gait Ambulation/Gait assistance: Min guard Ambulation Distance (Feet): 220 Feet Assistive device: 1 person hand held assist       General Gait Details: reciprocal stepping, mild gait deviation (sway) with head turns, mild lateral LOB with change of direction, cga/min assist for safety during initial part of distace  Stairs            Wheelchair Mobility    Modified Rankin (Stroke Patients Only)       Balance Overall balance assessment: Needs assistance Sitting-balance support: No upper extremity supported;Feet supported Sitting balance-Leahy Scale: Good     Standing balance support: No upper extremity supported Standing balance-Leahy Scale: Fair                               Pertinent Vitals/Pain Pain Assessment: No/denies pain    Home Living Family/patient expects to be discharged to:: Private residence Living Arrangements: Children Available Help at Discharge: Family Type of Home: House Home Access: Stairs to enter Entrance Stairs-Rails: None Entrance Stairs-Number of Steps: 1 Home Layout: Two level Home Equipment: None      Prior Function Level of Independence: Independent         Comments: Indep with ADLs, household and community mobility; + driving.  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  Very active.     Hand Dominance        Extremity/Trunk Assessment  Upper Extremity Assessment Upper Extremity Assessment: Overall WFL for tasks assessed    Lower Extremity Assessment Lower Extremity Assessment: Overall WFL for tasks assessed       Communication   Communication: No difficulties  Cognition  Arousal/Alertness: Awake/alert Behavior During Therapy: WFL for tasks assessed/performed Overall Cognitive Status: Within Functional Limits for tasks assessed                                        General Comments      Exercises Other Exercises Other Exercises: 220' without assist device, cga/close sup-improved control and fluidity as distance improved; feels initial instability related to not having been up in recent couple days.  Refuses trial of any type of assist device -"I don't want to get reliant on something like that"   Assessment/Plan    PT Assessment Patient needs continued PT services  PT Problem List Decreased strength;Decreased range of motion;Decreased activity tolerance;Decreased balance;Decreased mobility;Decreased coordination;Decreased cognition;Decreased knowledge of precautions       PT Treatment Interventions DME instruction;Gait training;Stair training;Functional mobility training;Therapeutic activities;Therapeutic exercise;Balance training;Patient/family education    PT Goals (Current goals can be found in the Care Plan section)  Acute Rehab PT Goals Patient Stated Goal: to return home with son PT Goal Formulation: With patient Time For Goal Achievement: 02/28/17 Potential to Achieve Goals: Good    Frequency Min 2X/week   Barriers to discharge        Co-evaluation               AM-PAC PT "6 Clicks" Daily Activity  Outcome Measure Difficulty turning over in bed (including adjusting bedclothes, sheets and blankets)?: None Difficulty moving from lying on back to sitting on the side of the bed? : A Little Difficulty sitting down on and standing up from a chair with arms (e.g., wheelchair, bedside commode, etc,.)?: A Little Help needed moving to and from a bed to chair (including a wheelchair)?: A Little Help needed walking in hospital room?: A Little Help needed climbing 3-5 steps with a railing? : A Little 6 Click Score: 19     End of Session Equipment Utilized During Treatment: Gait belt Activity Tolerance: Patient tolerated treatment well Patient left: in chair;with call bell/phone within reach;with chair alarm set Nurse Communication: Mobility status PT Visit Diagnosis: Difficulty in walking, not elsewhere classified (R26.2);Muscle weakness (generalized) (M62.81)    Time: 1201-1226 PT Time Calculation (min) (ACUTE ONLY): 25 min   Charges:   PT Evaluation $PT Eval Low Complexity: 1 Low PT Treatments $Gait Training: 8-22 mins   PT G Codes:   PT G-Codes **NOT FOR INPATIENT CLASS** Functional Assessment Tool Used: AM-PAC 6 Clicks Basic Mobility Functional Limitation: Mobility: Walking and moving around Mobility: Walking and Moving Around Current Status (W2637): At least 20 percent but less than 40 percent impaired, limited or restricted Mobility: Walking and Moving Around Goal Status [phone]): At least 1 percent but less than 20 percent impaired, limited or restricted    Tomas Schamp H. Owens Shark, PT, DPT, NCS 02/14/17, 2:21 PM [phone]

## 2017-02-14 NOTE — Telephone Encounter (Signed)
Thank you :)

## 2017-02-14 NOTE — Telephone Encounter (Signed)
Thursday at 11:30. She does not have an appointment that morning. If she does, this appointment need to be her priority.  Hollice Espy, MD

## 2017-02-14 NOTE — Progress Notes (Signed)
PHARMACY CONSULT NOTE - INITIAL   Pharmacy Consult for Electrolyte Monitoring and Management    Allergies  Allergen Reactions  . Evista [Raloxifene] Other (See Comments)    Night sweats Night sweats Reaction:  Night sweats   . Fluticasone-Salmeterol Other (See Comments)    Reaction:  Cough     Patient Measurements: Height: '5\' 5"'  (165.1 cm) Weight: 126 lb (57.2 kg) IBW/kg (Calculated) : 57  Vital Signs: Temp: 98.4 F (36.9 C) (10/03 0909) Temp Source: Oral (10/03 0909) BP: 143/42 (10/03 0909) Pulse Rate: 72 (10/03 0909) Intake/Output from previous day: 10/02 0701 - 10/03 0700 In: 7488.7 [I.V.:1588.7; IV Piggyback:100] Out: 0175 [Urine:6100; Blood:15] Intake/Output from this shift: Total I/O In: -  Out: 1740 [Urine:1740]  Labs:  Recent Labs  02/11/17 1516 02/12/17 0309 02/13/17 0513 02/14/17 0555  WBC 26.2*  --   --  18.8*  HGB 15.7  --   --  13.0  HCT 45.2  --   --  38.8  PLT 256  --   --  218  CREATININE 1.18* 0.84 0.88  --   MG  --  1.9 1.8  --   PHOS  --  2.4*  --   --   ALBUMIN 4.0  --   --   --   PROT 7.5  --   --   --   AST 33  --   --   --   ALT 21  --   --   --   ALKPHOS 63  --   --   --   BILITOT 1.3*  --   --   --    Estimated Creatinine Clearance: 45.9 mL/min (by C-G formula based on SCr of 0.88 mg/dL).   Microbiology: Recent Results (from the past 720 hour(s))  Urine Culture     Status: None   Collection Time: 02/08/17  3:20 PM  Result Value Ref Range Status   MICRO NUMBER: [id number]  Final   SPECIMEN QUALITY: ADEQUATE  Final   Sample Source BLOOD  Final   STATUS: FINAL  Final   Result:   Final    Multiple organisms present, each less than 10,000 CFU/mL. These organisms, commonly found on external and internal genitalia, are considered to be colonizers. No further testing performed.  Gastrointestinal Panel by PCR , Stool     Status: None   Collection Time: 02/11/17  4:47 PM  Result Value Ref Range Status   Campylobacter species NOT  DETECTED NOT DETECTED Final   Plesimonas shigelloides NOT DETECTED NOT DETECTED Final   Salmonella species NOT DETECTED NOT DETECTED Final   Yersinia enterocolitica NOT DETECTED NOT DETECTED Final   Vibrio species NOT DETECTED NOT DETECTED Final   Vibrio cholerae NOT DETECTED NOT DETECTED Final   Enteroaggregative E coli (EAEC) NOT DETECTED NOT DETECTED Final   Enteropathogenic E coli (EPEC) NOT DETECTED NOT DETECTED Final   Enterotoxigenic E coli (ETEC) NOT DETECTED NOT DETECTED Final   Shiga like toxin producing E coli (STEC) NOT DETECTED NOT DETECTED Final   Shigella/Enteroinvasive E coli (EIEC) NOT DETECTED NOT DETECTED Final   Cryptosporidium NOT DETECTED NOT DETECTED Final   Cyclospora cayetanensis NOT DETECTED NOT DETECTED Final   Entamoeba histolytica NOT DETECTED NOT DETECTED Final   Giardia lamblia NOT DETECTED NOT DETECTED Final   Adenovirus F40/41 NOT DETECTED NOT DETECTED Final   Astrovirus NOT DETECTED NOT DETECTED Final   Norovirus GI/GII NOT DETECTED NOT DETECTED Final   Rotavirus A NOT DETECTED  NOT DETECTED Final   Sapovirus (I, II, IV, and V) NOT DETECTED NOT DETECTED Final  C difficile quick scan w PCR reflex     Status: None   Collection Time: 02/11/17  4:47 PM  Result Value Ref Range Status   C Diff antigen NEGATIVE NEGATIVE Final   C Diff toxin NEGATIVE NEGATIVE Final   C Diff interpretation No C. difficile detected.  Final    Medical History: Past Medical History:  Diagnosis Date  . Anemia   . Asthma   . BRCA positive   . CAD (coronary artery disease)   . COPD (chronic obstructive pulmonary disease) ([place])   . Emphysema lung ([place])   . GERD (gastroesophageal reflux disease)   . History of colon polyps   . Hypercholesterolemia   . Hyperglycemia   . Hypertension   . Lung nodules   . Osteoporosis   . Personal history of tobacco use, presenting hazards to health 11/25/2014  . Polycythemia vera(238.4)   . Renal cyst   . Skin cancer     Assessment: 82  yo female with  Nausea and diarrhea for over a week. Patient reporting 6 pound weight loss in 3 days. Pharmacy has been consulted for electrolyte monitoring and replacement.   Goal of Therapy:  K: 3.5-5 mmol/L Mg: 1.9-2.4 mg/dL Phos: 2.6-4.6 mg/dL  Plan:  10/3 AM :  K: 4.1   Electrolytes wnl, No replacement needed at this time.  Will recheck electrolytes in 2 days with AM labs and continue to replace as needed.   Pernell Dupre, PharmD, BCPS Clinical Pharmacist 02/14/2017 11:02 AM

## 2017-02-14 NOTE — Progress Notes (Signed)
Irrigation port of CBI capped at 1125 per Dr Erlene Quan.

## 2017-02-14 NOTE — Progress Notes (Signed)
[facility] [place] NAME: Tina [name]    MR#:  [id number]  DATE OF BIRTH:  Mar 13, 1935  SUBJECTIVE:   Feels a lot better CBI draining pinkish tinge REVIEW OF SYSTEMS:   Review of Systems  Constitutional: Negative for chills, fever and weight loss.  HENT: Negative for ear discharge, ear pain and nosebleeds.   Eyes: Negative for blurred vision, pain and discharge.  Respiratory: Negative for sputum production, shortness of breath, wheezing and stridor.   Cardiovascular: Negative for chest pain, palpitations, orthopnea and PND.  Gastrointestinal: Positive for abdominal pain and nausea. Negative for diarrhea and vomiting.  Genitourinary: Negative for frequency and urgency.  Musculoskeletal: Negative for back pain and joint pain.  Neurological: Positive for weakness. Negative for sensory change, speech change and focal weakness.  Psychiatric/Behavioral: Negative for depression and hallucinations. The patient is not nervous/anxious.    Tolerating Diet:soft Tolerating PT: pending  DRUG ALLERGIES:   Allergies  Allergen Reactions  . Evista [Raloxifene] Other (See Comments)    Night sweats Night sweats Reaction:  Night sweats   . Fluticasone-Salmeterol Other (See Comments)    Reaction:  Cough     VITALS:  Blood pressure (!) 146/42, pulse 70, temperature 98.3 F (36.8 C), resp. rate 20, height 5\' 5"  (1.651 m), weight 57.2 kg (126 lb), SpO2 98 %.  PHYSICAL EXAMINATION:   Physical Exam  GENERAL:  82 y.o.-year-old patient lying in the bed with no acute distress.thin cachectic  EYES: Pupils equal, round, reactive to light and accommodation. No scleral icterus. Extraocular muscles intact.  HEENT: Head atraumatic, normocephalic. Oropharynx and nasopharynx clear.  NECK:  Supple, no jugular venous distention. No thyroid enlargement, no tenderness.  LUNGS: Normal breath sounds bilaterally, no wheezing, rales, rhonchi. No use of accessory  muscles of respiration.  CARDIOVASCULAR: S1, S2 normal. No murmurs, rubs, or gallops.  ABDOMEN: Soft, nontender, nondistended. Bowel sounds present. No organomegaly or mass. CBI+ EXTREMITIES: No cyanosis, clubbing or edema b/l.    NEUROLOGIC: Cranial nerves II through XII are intact. No focal Motor or sensory deficits b/l.   PSYCHIATRIC:  patient is alert and oriented x 3.  SKIN: No obvious rash, lesion, or ulcer.   LABORATORY PANEL:  CBC  Recent Labs Lab 02/14/17 0555  WBC 18.8*  HGB 13.0  HCT 38.8  PLT 218    Chemistries   Recent Labs Lab 02/11/17 1516  02/13/17 0513 02/14/17 0555  NA 136  < > 143  --   K 2.9*  < > 4.0 4.1  CL 96*  < > 113*  --   CO2 27  < > 25  --   GLUCOSE 162*  < > 124*  --   BUN 22*  < > 16  --   CREATININE 1.18*  < > 0.88  --   CALCIUM 9.4  < > 8.2*  --   MG  --   < > 1.8  --   AST 33  --   --   --   ALT 21  --   --   --   ALKPHOS 63  --   --   --   BILITOT 1.3*  --   --   --   < > = values in this interval not displayed. Cardiac Enzymes  Recent Labs Lab 02/11/17 1516  TROPONINI 0.03*   RADIOLOGY:  No results found. ASSESSMENT AND PLAN:  Tina [name]  is a 82 y.o. female with a  known history of asthma, COPD/emphysema, history of colon polyps with last colonoscopy 2016 showed sigmoid diverticulosis comes to the emergency room with increasing nausea and diarrhea for last week and a half. Patient reports feeling fatigued and malaise and loss of weight about 6 pounds and to 3 days. She is unable to eat much.  1. Nausea, diarrhea, weight loss with malaise and fatigue and abdominal CAT scan suggestive of atherosclerotic abdominal vessels with suggestion of possible ischemic colitis, irregular wall thickening involving the ascending colon suspicious for possibility of colon cancer and incidental note of 4.7 x 3.2 cm posterior bladder wall mass -Vascular surgery consultation with Dr. Lucky Cowboy appreciated. -patient is s/p abdominal angiogram with Stent  to the SMA with 6 mm diameter x 26 mm length Lifestream balloon expandable stent by Dr dew on 02/12/17 -IV fluids -Empiric IV Unasyn--change to po augmentin  2. Incidental posterior bladder wall mass -Urology consultation with Dr. Erlene Quan appreciated. -patient is s/p TURBT  With bilateral ureteral stent placement by Dr Erlene Quan 02/13/17. IV mitomycin chemo was injected x1 --Urology recommends CBI for now  3. Irregular thickening of descending colon with symptoms of malaise fatigue diarrhea and nausea and weight loss -GI consult with Dr. Allen Norris appreciated -Patient is s/p EGD - Small hiatal hernia.- Oozing gastric ulcers with a visible vessel. Treat  with argon plasma coagulation (APC).  -s/p colonoscopy- showed Mucosal ulceration.- Diverticulosis in the sigmoid colon and in the descending colon.  -IV unasyn--change to oral abxs  4. Hypokalemia Suspected due to GI losses and due to diuretics -repeat K is 4.0  5.Leukocytosis suspected due to #1 -Empiric Unasyn -improving  6. DVT prophylaxis subcutaneous Lovenox  CODE STATUS discussed with patient. Patient requests/wishes DO NOT RESUSCITATE  PT to see pt.  Case discussed with Care Management/Social Worker. Management plans discussed with the patient, family and they are in agreement.  CODE STATUS: DNR  DVT Prophylaxis: lovenox  TOTAL TIME TAKING CARE OF THIS PATIENT: *30* minutes.  >50% time spent on counselling and coordination of care  POSSIBLE D/C IN 2-3* DAYS, DEPENDING ON CLINICAL CONDITION.  Note: This dictation was prepared with Dragon dictation along with smaller phrase technology. Any transcriptional errors that result from this process are unintentional.  Krysteena Stalker M.D on 02/14/2017 at 7:54 AM  Between 7am to 6pm - Pager - [phone]  After 6pm go to [email or web address] - password [email or web address]  Office  [phone]  CC: Primary care physician; Einar Pheasant, MDPatient ID: Tina [name], female   DOB: 1934-08-28, 82 y.o.   MRN: [id number]

## 2017-02-14 NOTE — Progress Notes (Signed)
Urology Consult Follow Up  Subjective: Doing well this morning. CBI has been a very slow drip overnight. Stop this a.m. with light pink urine. Additionally bladder spasms yesterday evening but is improved. Son at bedside.  Anti-infectives: Anti-infectives    Start     Dose/Rate Route Frequency Ordered Stop   02/14/17 1000  amoxicillin-clavulanate (AUGMENTIN) 875-125 MG per tablet 1 tablet     1 tablet Oral Every 12 hours 02/14/17 0757     02/12/17 1443  cefUROXime (ZINACEF) 1.5 g in dextrose 5 % 50 mL IVPB  Status:  Discontinued     1.5 g 100 mL/hr over 30 Minutes Intravenous 30 min pre-op 02/12/17 1443 02/12/17 1741   02/12/17 1147  Ampicillin-Sulbactam (UNASYN) 3 g in sodium chloride 0.9 % 100 mL IVPB  Status:  Discontinued     3 g 200 mL/hr over 30 Minutes Intravenous Every 6 hours 02/12/17 0913 02/12/17 0938   02/12/17 1147  ampicillin-sulbactam (UNASYN) 1.5 g in sodium chloride 0.9 % 50 mL IVPB  Status:  Discontinued     1.5 g 100 mL/hr over 30 Minutes Intravenous Every 6 hours 02/12/17 0938 02/14/17 0757   02/11/17 1815  Ampicillin-Sulbactam (UNASYN) 3 g in sodium chloride 0.9 % 100 mL IVPB  Status:  Discontinued     3 g 200 mL/hr over 30 Minutes Intravenous Every 12 hours 02/11/17 1810 02/12/17 0913      Current Facility-Administered Medications  Medication Dose Route Frequency Provider Last Rate Last Dose  . acetaminophen (TYLENOL) tablet 650 mg  650 mg Oral Q6H PRN Fritzi Mandes, MD       Or  . acetaminophen (TYLENOL) suppository 650 mg  650 mg Rectal Q6H PRN Fritzi Mandes, MD      . albuterol (PROVENTIL) (2.5 MG/3ML) 0.083% nebulizer solution 3 mL  3 mL Inhalation Q6H PRN Fritzi Mandes, MD      . amLODipine (NORVASC) tablet 5 mg  5 mg Oral Daily Fritzi Mandes, MD   5 mg at 02/14/17 0933  . amoxicillin-clavulanate (AUGMENTIN) 875-125 MG per tablet 1 tablet  1 tablet Oral Q12H Fritzi Mandes, MD   1 tablet at 02/14/17 0933  . arformoterol (BROVANA) nebulizer solution 15 mcg  15 mcg  Nebulization BID Fritzi Mandes, MD   15 mcg at 02/14/17 4580   And  . budesonide (PULMICORT) nebulizer solution 0.5 mg  0.5 mg Nebulization BID Fritzi Mandes, MD   0.5 mg at 02/14/17 0850  . aspirin EC tablet 81 mg  81 mg Oral Daily Algernon Huxley, MD   81 mg at 02/14/17 9983  . cholecalciferol (VITAMIN D) tablet 1,000 Units  1,000 Units Oral Daily Fritzi Mandes, MD   1,000 Units at 02/14/17 0934  . enoxaparin (LOVENOX) injection 40 mg  40 mg Subcutaneous Q24H Fritzi Mandes, MD   40 mg at 02/13/17 2315  . hydrALAZINE (APRESOLINE) injection 10 mg  10 mg Intravenous Q6H PRN Fritzi Mandes, MD      . Influenza vac split quadrivalent PF (FLUZONE HIGH-DOSE) injection 0.5 mL  0.5 mL Intramuscular Tomorrow-1000 Fritzi Mandes, MD      . ipratropium-albuterol (DUONEB) 0.5-2.5 (3) MG/3ML nebulizer solution 3 mL  3 mL Nebulization Q6H PRN Fritzi Mandes, MD      . losartan (COZAAR) tablet 100 mg  100 mg Oral Daily Fritzi Mandes, MD   100 mg at 02/14/17 0932  . magnesium oxide (MAG-OX) tablet 400 mg  400 mg Oral BID Hallaji, Sheema M, RPH   400 mg at  02/14/17 0934  . metoprolol tartrate (LOPRESSOR) tablet 12.5 mg  12.5 mg Oral BID Fritzi Mandes, MD   12.5 mg at 02/14/17 0935  . metoprolol tartrate (LOPRESSOR) tablet 12.5 mg  12.5 mg Oral Once [place], [name], MD      . mitoMYcin (MUTAMYCIN) chemo injection 40 mg  40 mg Bladder Instillation Once Hollice Espy, MD      . morphine 2 MG/ML injection 1 mg  1 mg Intravenous Q6H PRN Fritzi Mandes, MD   1 mg at 02/14/17 0707  . ondansetron (ZOFRAN) tablet 4 mg  4 mg Oral Q8H PRN Fritzi Mandes, MD      . oxyCODONE-acetaminophen (PERCOCET/ROXICET) 5-325 MG per tablet 1-2 tablet  1-2 tablet Oral Q6H PRN Fritzi Mandes, MD   1 tablet at 02/14/17 0428  . pantoprazole (PROTONIX) EC tablet 40 mg  40 mg Oral BID Fritzi Mandes, MD   40 mg at 02/14/17 0934  . polyethylene glycol (MIRALAX / GLYCOLAX) packet 17 g  17 g Oral Daily PRN Fritzi Mandes, MD      . pravastatin (PRAVACHOL) tablet 40 mg  40 mg Oral  q1800 Fritzi Mandes, MD   40 mg at 02/13/17 1428     Objective: Vital signs in last 24 hours: Temp:  [97.7 F (36.5 C)-98.5 F (36.9 C)] 98.4 F (36.9 C) (10/03 0909) Pulse Rate:  [58-78] 72 (10/03 0909) Resp:  [12-20] 18 (10/03 0909) BP: (143-175)/(42-80) 143/42 (10/03 0909) SpO2:  [94 %-100 %] 95 % (10/03 0909)  Intake/Output from previous day: 10/02 0701 - 10/03 0700 In: 7488.7 [I.V.:1588.7; IV Piggyback:100] Out: 6115 [Urine:6100; Blood:15] Intake/Output this shift: No intake/output data recorded.   Physical Exam  Constitutional: She is oriented to person, place, and time and well-developed, well-nourished, and in no distress.  HENT:  Head: Normocephalic and atraumatic.  Pulmonary/Chest: Effort normal. No respiratory distress.  Abdominal: Soft. There is no tenderness.  Protuberant but not distended. No rebound or guarding.  Genitourinary:  Genitourinary Comments: Foley catheter in place draining yellow urine with small amount of sediment.  Neurological: She is alert and oriented to person, place, and time.  Skin: Skin is warm and dry.  Psychiatric: Affect normal.  Vitals reviewed.   Lab Results:   Recent Labs  02/11/17 1516 02/14/17 0555  WBC 26.2* 18.8*  HGB 15.7 13.0  HCT 45.2 38.8  PLT 256 218   BMET  Recent Labs  02/12/17 0309 02/13/17 0513 02/14/17 0555  NA 138 143  --   K 3.0* 4.0 4.1  CL 102 113*  --   CO2 26 25  --   GLUCOSE 143* 124*  --   BUN 17 16  --   CREATININE 0.84 0.88  --   CALCIUM 8.6* 8.2*  --     Assessment/Plan:  1. Bladder tumor- postop day 1 status post TURBT, large tumor invading trigone and involving bilateral ureteral orifices requiring ureteral stent placement. Pathology pending. Stop CBI today, cap third port. Discharge home with Foley 1 week.  We'll arrange for outpatient follow-up next week to discuss pathology results and establish oncologic plan.  2. Bladder spasms- Ditropan 3 times a day when necessary for  bladder spasms of severe.  3. AKI- Cr back to baseline.      LOS: 3 days    Hollice Espy 02/14/2017

## 2017-02-14 NOTE — Telephone Encounter (Signed)
done

## 2017-02-14 NOTE — Plan of Care (Signed)
Problem: Pain Managment: Goal: General experience of comfort will improve Outcome: Progressing C/o bladder pain once, oxycodone given with improvement.  Problem: Physical Regulation: Goal: Ability to maintain clinical measurements within normal limits will improve Outcome: Progressing CBI stopped today by Dr Erlene Quan. Pt voided light pink urine via foley. No BM since admission. Pt has bowel sounds and passing flatus. Will continue to monitor.

## 2017-02-14 NOTE — Progress Notes (Signed)
[facility]  Daily Progress Note   Subjective  - 1 Day Post-Op  Doing fairly well. Has eaten breakfast and lunch today with only mild amounts of pain. No problems from her access site. No fevers or chills. Has undergone both the urologic procedure as well as upper and lower endoscopy in the last 48 hours but overall looks reasonably well  Objective Vitals:   02/14/17 0842 02/14/17 0900 02/14/17 0909 02/14/17 1217  BP:   (!) 143/42 (!) 168/77  Pulse:   72 92  Resp:   18 18  Temp:   98.4 F (36.9 C) 97.7 F (36.5 C)  TempSrc:   Oral Oral  SpO2: 93% 93% 95% 95%  Weight:      Height:        Intake/Output Summary (Last 24 hours) at 02/14/17 1530 Last data filed at 02/14/17 1300  Gross per 24 hour  Intake          3528.67 ml  Output             5240 ml  Net         -1711.33 ml    PULM  CTAB CV  RRR VASC  Access site is clean, dry, and intact ABD    mildly distended but no significant tenderness to palpation  Laboratory CBC    Component Value Date/Time   WBC 18.8 (H) 02/14/2017 0555   HGB 13.0 02/14/2017 0555   HGB 13.2 08/14/2014 1344   HCT 38.8 02/14/2017 0555   HCT 40.6 08/14/2014 1344   PLT 218 02/14/2017 0555   PLT 285 08/14/2014 1344    BMET    Component Value Date/Time   NA 143 02/13/2017 0513   NA 146 (H) 09/28/2016 1231   K 4.1 02/14/2017 0555   CL 113 (H) 02/13/2017 0513   CO2 25 02/13/2017 0513   GLUCOSE 124 (H) 02/13/2017 0513   BUN 16 02/13/2017 0513   BUN 19 09/28/2016 1231   CREATININE 0.88 02/13/2017 0513   CALCIUM 8.2 (L) 02/13/2017 0513   GFRNONAA >60 02/13/2017 0513   GFRAA >60 02/13/2017 0513    Assessment/Planning: POD #2 s/p SMA stent placement   Doing well from a vascular standpoint  Would discharge on aspirin and Plavix.  Should follow up with me in the office in 4 weeks with mesenteric duplex.      Leotis Pain  02/14/2017, 3:30 PM

## 2017-02-15 ENCOUNTER — Other Ambulatory Visit: Payer: Self-pay | Admitting: Pathology

## 2017-02-15 LAB — SURGICAL PATHOLOGY

## 2017-02-15 MED ORDER — ASPIRIN 81 MG PO TBEC: 81.0000 mg | DELAYED_RELEASE_TABLET | Freq: Every day | ORAL | 1 refills | Status: DC

## 2017-02-15 MED ORDER — ENSURE ENLIVE PO LIQD: 237.0000 mL | Freq: Two times a day (BID) | ORAL | 12 refills | Status: DC

## 2017-02-15 MED ORDER — CLOPIDOGREL BISULFATE 75 MG PO TABS: 75.0000 mg | ORAL_TABLET | Freq: Every day | ORAL | 2 refills | Status: DC

## 2017-02-15 MED ORDER — OXYCODONE-ACETAMINOPHEN 5-325 MG PO TABS: 1.0000 | ORAL_TABLET | Freq: Four times a day (QID) | ORAL | 0 refills | Status: DC | PRN

## 2017-02-15 MED ORDER — OXYBUTYNIN CHLORIDE 5 MG PO TABS: 5.0000 mg | ORAL_TABLET | Freq: Three times a day (TID) | ORAL | 0 refills | Status: DC | PRN

## 2017-02-15 MED ORDER — AMOXICILLIN-POT CLAVULANATE 875-125 MG PO TABS: 1.0000 | ORAL_TABLET | Freq: Two times a day (BID) | ORAL | 0 refills | Status: DC

## 2017-02-15 MED ORDER — CLOPIDOGREL BISULFATE 75 MG PO TABS
75.0000 mg | ORAL_TABLET | Freq: Every day | ORAL | Status: DC
  Administered 2017-02-15: 08:00:00 75 mg via ORAL
  Filled 2017-02-15: qty 1

## 2017-02-15 MED ORDER — ENSURE ENLIVE PO LIQD
237.0000 mL | Freq: Two times a day (BID) | ORAL | Status: DC
  Administered 2017-02-15: 237 mL via ORAL

## 2017-02-15 MED ORDER — OXYBUTYNIN CHLORIDE 5 MG PO TABS
5.0000 mg | ORAL_TABLET | Freq: Three times a day (TID) | ORAL | 0 refills | Status: DC | PRN
Start: 2017-02-15 — End: 2017-03-15

## 2017-02-15 MED ORDER — OXYBUTYNIN CHLORIDE 5 MG PO TABS: 5.0000 mg | ORAL_TABLET | Freq: Three times a day (TID) | ORAL | Status: DC | PRN

## 2017-02-15 NOTE — Progress Notes (Signed)
Medications administered by student RN [id number] with supervision of Clinical Instructor Adonus Uselman MSN, RN-BC or patient's assigned RN.   

## 2017-02-15 NOTE — Progress Notes (Signed)
Pt has being discharged home with [facility]. Pt discharged with foley. Discharge papers given and explained to pt. Pt verbalized understanding. Meds and f/u appointments reviewed. RX given. Pt escorted in a wheelchair.

## 2017-02-15 NOTE — Discharge Instructions (Signed)
HHPT Foley care

## 2017-02-15 NOTE — Discharge Summary (Addendum)
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 Anemia   . Asthma   . BRCA positive   . CAD (coronary artery disease)   . COPD (chronic obstructive pulmonary disease) (Tina [name])   . Emphysema lung ([place])   . GERD (gastroesophageal reflux disease)   . History of colon polyps   . Hypercholesterolemia   . Hyperglycemia   . Hypertension   . Lung nodules   . Osteoporosis   . Personal history of tobacco use, presenting hazards to health 11/25/2014  . Polycythemia vera(238.4)   . Renal cyst   . Skin cancer     HOSPITAL COURSE:   Tina [name] a 82 y.o. femalewith a known history of asthma, COPD/emphysema, history of colon polyps with last colonoscopy 2016 showed sigmoid diverticulosis comes to the emergency room with increasing nausea and diarrhea for last week and a half. Patient reports feeling fatigued and malaise and loss of weight about 6 pounds and to 3 days. She is unable to eat much.  1. Nausea, diarrhea, weight loss with malaise and fatigue and abdominal CAT scan suggestive of atherosclerotic abdominal vessels with suggestion ischemic colitis, irregular wall thickening involving the ascending colon suspicious for possibility of colon cancer and incidental note of 4.7 x 3.2 cm posterior bladder wall mass -Vascular surgery consultation with Dr. Lucky Cowboy appreciated. -patient is s/p abdominal angiogram with Stent to  the [facility] 31m diameter x 231mlength Lifestream balloon expandable stent by Dr dew on 02/12/17 -Empiric IV Unasyn--change to po augmentin -ASA + Plavix per Dr DeLucky Cowboy2. Incidental posterior bladder wall mass -Urology consultation with Dr. BrErlene Quanppreciated. -patient is s/p TURBT  With bilateral ureteral stent placement by Dr BrErlene Quan0/2/18.  --CBI d/ced. -pt will f/u Urology for path results and further w/u  3.Ischemic colitis - Irregular thickening of descending colon with symptoms of malaise fatigue diarrhea and nausea and weight loss -GI consult with Dr. woAllen Norrisppreciated -Patient is s/p EGD - Small hiatal hernia.- Oozing gastric ulcers with a visible vessel. Treatwith argon plasma coagulation (APC).  -s/p colonoscopy- showed Mucosal ulceration.- Diverticulosis in the sigmoid colon and in the descending colon.  -IV unasyn--change to oral abxs -Colon bx showed evidence of ischemic colitis  4. Hypokalemia Suspected due to GI losses and due to diuretics -repeat K is 4.0  5.Leukocytosis suspected due to #1 -improving  6. DVT prophylaxis subcutaneous Lovenox  CODE STATUS discussed with patient. Patient requests/wishes DO NOT RESUSCITATE  D/c home with HHPT/RN  CONSULTS OBTAINED:  Treatment Team:  DeAlgernon HuxleyMD WoLucilla LameMD EsFestus AloeMD  DRUG ALLERGIES:   Allergies  Allergen Reactions  . Evista [Raloxifene] Other (See Comments)    Night sweats Night sweats Reaction:  Night sweats   . Fluticasone-Salmeterol Other (See Comments)    Reaction:  Cough     DISCHARGE MEDICATIONS:   Current Discharge Medication List    START taking these medications   Details  amoxicillin-clavulanate (AUGMENTIN)  875-125 MG tablet Take 1 tablet by mouth every 12 (twelve) hours. Qty: 4 tablet, Refills: 0    aspirin EC 81 MG EC tablet Take 1 tablet (81 mg total) by mouth daily. Qty: 30 tablet, Refills: 1    clopidogrel (PLAVIX) 75 MG tablet Take 1 tablet (75  mg total) by mouth daily. Qty: 30 tablet, Refills: 2    feeding supplement, ENSURE ENLIVE, (ENSURE ENLIVE) LIQD Take 237 mLs by mouth 2 (two) times daily between meals. Qty: 237 mL, Refills: 12    oxybutynin (DITROPAN) 5 MG tablet Take 1 tablet (5 mg total) by mouth every 8 (eight) hours as needed for bladder spasms. Qty: 30 tablet, Refills: 0    oxyCODONE-acetaminophen (PERCOCET/ROXICET) 5-325 MG tablet Take 1-2 tablets by mouth every 6 (six) hours as needed for moderate pain. Qty: 30 tablet, Refills: 0      CONTINUE these medications which have NOT CHANGED   Details  amLODipine (NORVASC) 5 MG tablet TAKE 1 TABLET BY MOUTH  DAILY Qty: 90 tablet, Refills: 1    cholecalciferol (VITAMIN D) 1000 units tablet Take 1,000 Units by mouth daily.    losartan (COZAAR) 100 MG tablet TAKE 1 TABLET BY MOUTH  DAILY Qty: 90 tablet, Refills: 1    lovastatin (MEVACOR) 40 MG tablet TAKE 1 TABLET BY MOUTH  DAILY Qty: 90 tablet, Refills: 1    metoprolol tartrate (LOPRESSOR) 25 MG tablet TAKE ONE-HALF TABLET BY  MOUTH TWO TIMES DAILY Qty: 90 tablet, Refills: 1    ondansetron (ZOFRAN) 4 MG tablet Take 1 tablet (4 mg total) by mouth every 8 (eight) hours as needed for nausea or vomiting. Qty: 20 tablet, Refills: 0    pantoprazole (PROTONIX) 40 MG tablet Take 1 tablet (40 mg total) by mouth daily. Qty: 30 tablet, Refills: 1    potassium chloride (K-DUR) 10 MEQ tablet Take 1 tablet twice a day for two days , then take 1 tablet daily then on. Qty: 30 tablet, Refills: 0    SYMBICORT 160-4.5 MCG/ACT inhaler USE 2 PUFFS TWO TIMES DAILY Qty: 30.6 g, Refills: 1    albuterol (PROVENTIL HFA;VENTOLIN HFA) 108 (90 Base) MCG/ACT inhaler Inhale 2 puffs into the lungs every 6 (six) hours as needed for wheezing or shortness of breath. Qty: 1 Inhaler, Refills: 2    fluticasone (FLONASE) 50 MCG/ACT nasal spray Place 2 sprays into both nostrils daily. Qty: 16 g, Refills: 5    ipratropium-albuterol (DUONEB)  0.5-2.5 (3) MG/3ML SOLN Take 3 mLs by nebulization every 6 (six) hours as needed (for shortness of breath). Qty: 36 mL, Refills: 0      STOP taking these medications     triamterene-hydrochlorothiazide (MAXZIDE-25) 37.5-25 MG tablet         If you experience worsening of your admission symptoms, develop shortness of breath, life threatening emergency, suicidal or homicidal thoughts you must seek medical attention immediately by calling 911 or calling your MD immediately  if symptoms less severe.  You Must read complete instructions/literature along with all the possible adverse reactions/side effects for all the Medicines you take and that have been prescribed to you. Take any new Medicines after you have completely understood and accept all the possible adverse reactions/side effects.   Please note  You were cared for by a hospitalist during your hospital stay. If you have any questions about your discharge medications or the care you received while you were in the hospital after you are discharged, you can call the unit and asked  to speak with the hospitalist on call if the hospitalist that took care of you is not available. Once you are discharged, your primary care physician will handle any further medical issues. Please note that NO REFILLS for any discharge medications will be authorized once you are discharged, as it is imperative that you return to your primary care physician (or establish a relationship with a primary care physician if you do not have one) for your aftercare needs so that they can reassess your need for medications and monitor your lab values. Today   SUBJECTIVE   Doing well  VITAL SIGNS:  Blood pressure (!) 152/93, pulse 78, temperature 98.9 F (37.2 C), temperature source Oral, resp. rate 18, height 5' 5" (1.651 m), weight 57.2 kg (126 lb), SpO2 93 %.  I/O:    Intake/Output Summary (Last 24 hours) at 02/15/17 1122 Last data filed at 02/15/17 1030  Gross  per 24 hour  Intake              840 ml  Output             1850 ml  Net            -1010 ml    PHYSICAL EXAMINATION:  GENERAL:  82 y.o.-year-old patient lying in the bed with no acute distress.  EYES: Pupils equal, round, reactive to light and accommodation. No scleral icterus. Extraocular muscles intact.  HEENT: Head atraumatic, normocephalic. Oropharynx and nasopharynx clear.  NECK:  Supple, no jugular venous distention. No thyroid enlargement, no tenderness.  LUNGS: Normal breath sounds bilaterally, no wheezing, rales,rhonchi or crepitation. No use of accessory muscles of respiration.  CARDIOVASCULAR: S1, S2 normal. No murmurs, rubs, or gallops.  ABDOMEN: Soft, non-tender, non-distended. Bowel sounds present. No organomegaly or mass. Foley draining pink urine EXTREMITIES: No pedal edema, cyanosis, or clubbing.  NEUROLOGIC: Cranial nerves II through XII are intact. Muscle strength 5/5 in all extremities. Sensation intact. Gait not checked.  PSYCHIATRIC: The patient is alert and oriented x 3.  SKIN: No obvious rash, lesion, or ulcer.   DATA REVIEW:   CBC   Recent Labs Lab 02/14/17 0555  WBC 18.8*  HGB 13.0  HCT 38.8  PLT 218    Chemistries   Recent Labs Lab 02/11/17 1516  02/13/17 0513 02/14/17 0555  NA 136  < > 143  --   K 2.9*  < > 4.0 4.1  CL 96*  < > 113*  --   CO2 27  < > 25  --   GLUCOSE 162*  < > 124*  --   BUN 22*  < > 16  --   CREATININE 1.18*  < > 0.88  --   CALCIUM 9.4  < > 8.2*  --   MG  --   < > 1.8  --   AST 33  --   --   --   ALT 21  --   --   --   ALKPHOS 63  --   --   --   BILITOT 1.3*  --   --   --   < > = values in this interval not displayed.  Microbiology Results   Recent Results (from the past 240 hour(s))  Urine Culture     Status: None   Collection Time: 02/08/17  3:20 PM  Result Value Ref Range Status   MICRO NUMBER: [id number]  Final   SPECIMEN QUALITY: ADEQUATE  Final   Sample Source BLOOD  Final  STATUS: FINAL  Final    Result:   Final    Multiple organisms present, each less than 10,000 CFU/mL. These organisms, commonly found on external and internal genitalia, are considered to be colonizers. No further testing performed.  Gastrointestinal Panel by PCR , Stool     Status: None   Collection Time: 02/11/17  4:47 PM  Result Value Ref Range Status   Campylobacter species NOT DETECTED NOT DETECTED Final   Plesimonas shigelloides NOT DETECTED NOT DETECTED Final   Salmonella species NOT DETECTED NOT DETECTED Final   Yersinia enterocolitica NOT DETECTED NOT DETECTED Final   Vibrio species NOT DETECTED NOT DETECTED Final   Vibrio cholerae NOT DETECTED NOT DETECTED Final   Enteroaggregative E coli (EAEC) NOT DETECTED NOT DETECTED Final   Enteropathogenic E coli (EPEC) NOT DETECTED NOT DETECTED Final   Enterotoxigenic E coli (ETEC) NOT DETECTED NOT DETECTED Final   Shiga like toxin producing E coli (STEC) NOT DETECTED NOT DETECTED Final   Shigella/Enteroinvasive E coli (EIEC) NOT DETECTED NOT DETECTED Final   Cryptosporidium NOT DETECTED NOT DETECTED Final   Cyclospora cayetanensis NOT DETECTED NOT DETECTED Final   Entamoeba histolytica NOT DETECTED NOT DETECTED Final   Giardia lamblia NOT DETECTED NOT DETECTED Final   Adenovirus F40/41 NOT DETECTED NOT DETECTED Final   Astrovirus NOT DETECTED NOT DETECTED Final   Norovirus GI/GII NOT DETECTED NOT DETECTED Final   Rotavirus A NOT DETECTED NOT DETECTED Final   Sapovirus (I, II, IV, and V) NOT DETECTED NOT DETECTED Final  C difficile quick scan w PCR reflex     Status: None   Collection Time: 02/11/17  4:47 PM  Result Value Ref Range Status   C Diff antigen NEGATIVE NEGATIVE Final   C Diff toxin NEGATIVE NEGATIVE Final   C Diff interpretation No C. difficile detected.  Final    RADIOLOGY:  No results found.   Management plans discussed with the patient, family and they are in agreement.  CODE STATUS:     Code Status Orders        Start     Ordered    02/11/17 2019  Do not attempt resuscitation (DNR)  Continuous    Question Answer Comment  In the event of cardiac or respiratory ARREST Do not call a "code blue"   In the event of cardiac or respiratory ARREST Do not perform Intubation, CPR, defibrillation or ACLS   In the event of cardiac or respiratory ARREST Use medication by any route, position, wound care, and other measures to relive pain and suffering. May use oxygen, suction and manual treatment of airway obstruction as needed for comfort.      02/11/17 2018    Code Status History    Date Active Date Inactive Code Status Order ID Comments User Context   08/21/2016 10:04 PM 08/23/2016  3:11 PM Full Code [id number]  Henreitta Leber, MD Inpatient   06/14/2016  8:28 PM 06/17/2016  4:21 PM Full Code [id number]  Theodoro Grist, MD Inpatient   11/14/2014  8:57 AM 11/15/2014  3:57 PM Full Code [id number]  Aldean Jewett, MD Inpatient      TOTAL TIME TAKING CARE OF THIS PATIENT: *40* minutes.    Soni Kegel M.D on 02/15/2017 at 11:22 AM  Between 7am to 6pm - Pager - [phone] After 6pm go to [email or web address] - password [email or web address]  Office  [phone]  CC: Primary care physician; Einar Pheasant, MD

## 2017-02-15 NOTE — Plan of Care (Signed)
Problem: Education: Goal: Knowledge of [facility] information/materials will improve Outcome: Progressing VSS, free of falls during shift.  Reported bladder pain 6/10, improved to 5/10 w/ PRN IV Morphine 1mg .  No other complaints overnight.  Foley draining pink urine.  Bed in low position, call bell within reach.  WCTM.

## 2017-02-15 NOTE — Care Management Important Message (Signed)
Important Message  Patient Details  Name: Tina [name] MRN: [id number] Date of Birth: 19-Nov-1934   Medicare Important Message Given:  Yes    Shelbie Ammons, RN 02/15/2017, 8:07 AM

## 2017-02-15 NOTE — Care Management Note (Signed)
Case Management Note  Patient Details  Name: Tina [name] MRN: [id number] Date of Birth: 01/25/1935  Subjective/Objective:  Admitted to [facility] with the diagnosis of abdominal pain. Son, Donnalee Curry, lives in the home. [id number]). Seen Dr. Einar Pheasant a week ago. Prescriptions are filled at [facility]. No home health. No skilled facility, No home oxygen, No medical   Equipment in the home. Takes care of all basic and instrumental activities of daily living herself, drives. No falls. Good appetite. Son will transport.              Discharge to home today per Dr. Posey Pronto  Action/Plan: Physical therapy evaluation completed. Recommended home with home health and physical therapy.  Bladder resection done 02/13/17. Foley in place.  Discussed services with [name]. [place]. Would like skilled nursing only for foley care. Declines physical therapy.   Expected Discharge Date:  02/15/17               Expected Discharge Plan:     In-House Referral:     Discharge planning Services     Post Acute Care Choice:   yes Choice offered to:   patient  DME Arranged:    DME Agency:     HH Arranged:   yes HH Agency:   [facility]   Status of Service:     If discussed at [place] of Stay Meetings, dates discussed:    Additional Comments:  Shelbie Ammons, RN MSN CCM Care Management [phone] 02/15/2017, 8:18 AM

## 2017-02-16 ENCOUNTER — Telehealth: Payer: Self-pay

## 2017-02-16 ENCOUNTER — Other Ambulatory Visit: Payer: Medicare Other

## 2017-02-16 NOTE — Telephone Encounter (Signed)
  Oncology Nurse Navigator Documentation Voicemail left with Sharyn Lull at [facility] to see if Dr. Erlene Quan can she her sooner for patholgy review. Dr. Janese Banks would like to see her right away related to diagnosis. Navigator Location: [facility] (02/16/17 0900)   )                                                     Time Spent with Patient: 15 (02/16/17 0900)

## 2017-02-19 ENCOUNTER — Telehealth: Payer: Self-pay | Admitting: Oncology

## 2017-02-19 ENCOUNTER — Telehealth: Payer: Self-pay

## 2017-02-19 ENCOUNTER — Telehealth: Payer: Self-pay | Admitting: *Deleted

## 2017-02-19 DIAGNOSIS — J45909 Unspecified asthma, uncomplicated: Secondary | ICD-10-CM | POA: Diagnosis not present

## 2017-02-19 DIAGNOSIS — Z7982 Long term (current) use of aspirin: Secondary | ICD-10-CM | POA: Diagnosis not present

## 2017-02-19 DIAGNOSIS — C679 Malignant neoplasm of bladder, unspecified: Secondary | ICD-10-CM | POA: Diagnosis not present

## 2017-02-19 DIAGNOSIS — Z951 Presence of aortocoronary bypass graft: Secondary | ICD-10-CM | POA: Diagnosis not present

## 2017-02-19 DIAGNOSIS — Z87891 Personal history of nicotine dependence: Secondary | ICD-10-CM | POA: Diagnosis not present

## 2017-02-19 DIAGNOSIS — Z95828 Presence of other vascular implants and grafts: Secondary | ICD-10-CM | POA: Diagnosis not present

## 2017-02-19 DIAGNOSIS — D45 Polycythemia vera: Secondary | ICD-10-CM | POA: Diagnosis not present

## 2017-02-19 DIAGNOSIS — Z466 Encounter for fitting and adjustment of urinary device: Secondary | ICD-10-CM | POA: Diagnosis not present

## 2017-02-19 DIAGNOSIS — Z483 Aftercare following surgery for neoplasm: Secondary | ICD-10-CM | POA: Diagnosis not present

## 2017-02-19 DIAGNOSIS — I2510 Atherosclerotic heart disease of native coronary artery without angina pectoris: Secondary | ICD-10-CM | POA: Diagnosis not present

## 2017-02-19 DIAGNOSIS — J449 Chronic obstructive pulmonary disease, unspecified: Secondary | ICD-10-CM | POA: Diagnosis not present

## 2017-02-19 DIAGNOSIS — Z79891 Long term (current) use of opiate analgesic: Secondary | ICD-10-CM | POA: Diagnosis not present

## 2017-02-19 DIAGNOSIS — Z7902 Long term (current) use of antithrombotics/antiplatelets: Secondary | ICD-10-CM | POA: Diagnosis not present

## 2017-02-19 DIAGNOSIS — I10 Essential (primary) hypertension: Secondary | ICD-10-CM | POA: Diagnosis not present

## 2017-02-19 DIAGNOSIS — K551 Chronic vascular disorders of intestine: Secondary | ICD-10-CM | POA: Diagnosis not present

## 2017-02-19 DIAGNOSIS — Z85828 Personal history of other malignant neoplasm of skin: Secondary | ICD-10-CM | POA: Diagnosis not present

## 2017-02-19 DIAGNOSIS — Z955 Presence of coronary angioplasty implant and graft: Secondary | ICD-10-CM | POA: Diagnosis not present

## 2017-02-19 NOTE — Telephone Encounter (Signed)
  Oncology Nurse Navigator Documentation Spoke with Tina [name] at Dr. Cherrie Gauze office. Per Dr. Erlene Quan, Tina [name] is aware she has cancer and we can go ahead and see her. She does not have to wait until seen by Dr. Erlene Quan. She will keep this appointment for foley removal and stent discussion. Spoke with Tina [name] and she can come tomorrow to see Tina [name] at 9:15an. Scheduling notified to change appointment. Navigator Location: [facility] (02/19/17 1500)   )Navigator Encounter Type: Telephone (02/19/17 1500) Telephone: Appt Confirmation/Clarification (02/19/17 1500)                                                  Time Spent with Patient: 15 (02/19/17 1500)

## 2017-02-19 NOTE — Telephone Encounter (Signed)
Consultation for New Dx of Small Cell Bladder Cancer . Ref by Dr  Hollice Espy, per 02/19/17 schd msg/Dr Janese Banks.  Notes in Epic. (est pt for Polycythemia)  Appointment confirmed with patient. Appt reminder also mailed. MF

## 2017-02-19 NOTE — Telephone Encounter (Signed)
[name] Pope from [facility] has requested a call to discuss pt's medication changes in reference to triamterene  Please call [name] at [phone]

## 2017-02-19 NOTE — Telephone Encounter (Signed)
[name] called back returning your call. Please advise, thank you!  Call [name] @ [phone]

## 2017-02-19 NOTE — Telephone Encounter (Signed)
Left voice mail to call back for [name] [facility]

## 2017-02-20 ENCOUNTER — Encounter: Payer: Self-pay | Admitting: Oncology

## 2017-02-20 ENCOUNTER — Telehealth: Payer: Self-pay

## 2017-02-20 ENCOUNTER — Inpatient Hospital Stay: Payer: Medicare Other | Attending: Oncology | Admitting: Oncology

## 2017-02-20 VITALS — BP 156/66 | HR 78 | Temp 99.3°F | Resp 18 | Ht 64.5 in | Wt 140.0 lb

## 2017-02-20 DIAGNOSIS — D751 Secondary polycythemia: Secondary | ICD-10-CM | POA: Diagnosis not present

## 2017-02-20 DIAGNOSIS — Z803 Family history of malignant neoplasm of breast: Secondary | ICD-10-CM | POA: Insufficient documentation

## 2017-02-20 DIAGNOSIS — M810 Age-related osteoporosis without current pathological fracture: Secondary | ICD-10-CM | POA: Insufficient documentation

## 2017-02-20 DIAGNOSIS — K219 Gastro-esophageal reflux disease without esophagitis: Secondary | ICD-10-CM | POA: Diagnosis not present

## 2017-02-20 DIAGNOSIS — Z800 Family history of malignant neoplasm of digestive organs: Secondary | ICD-10-CM

## 2017-02-20 DIAGNOSIS — Z5111 Encounter for antineoplastic chemotherapy: Secondary | ICD-10-CM | POA: Insufficient documentation

## 2017-02-20 DIAGNOSIS — Z85828 Personal history of other malignant neoplasm of skin: Secondary | ICD-10-CM | POA: Insufficient documentation

## 2017-02-20 DIAGNOSIS — Z7189 Other specified counseling: Secondary | ICD-10-CM

## 2017-02-20 DIAGNOSIS — Z23 Encounter for immunization: Secondary | ICD-10-CM | POA: Diagnosis not present

## 2017-02-20 DIAGNOSIS — R918 Other nonspecific abnormal finding of lung field: Secondary | ICD-10-CM | POA: Insufficient documentation

## 2017-02-20 DIAGNOSIS — Z87891 Personal history of nicotine dependence: Secondary | ICD-10-CM | POA: Insufficient documentation

## 2017-02-20 DIAGNOSIS — N281 Cyst of kidney, acquired: Secondary | ICD-10-CM | POA: Diagnosis not present

## 2017-02-20 DIAGNOSIS — I2510 Atherosclerotic heart disease of native coronary artery without angina pectoris: Secondary | ICD-10-CM | POA: Diagnosis not present

## 2017-02-20 DIAGNOSIS — C679 Malignant neoplasm of bladder, unspecified: Secondary | ICD-10-CM | POA: Diagnosis not present

## 2017-02-20 DIAGNOSIS — J449 Chronic obstructive pulmonary disease, unspecified: Secondary | ICD-10-CM | POA: Diagnosis not present

## 2017-02-20 DIAGNOSIS — D649 Anemia, unspecified: Secondary | ICD-10-CM | POA: Insufficient documentation

## 2017-02-20 DIAGNOSIS — I700 Atherosclerosis of aorta: Secondary | ICD-10-CM | POA: Insufficient documentation

## 2017-02-20 DIAGNOSIS — Z8711 Personal history of peptic ulcer disease: Secondary | ICD-10-CM | POA: Diagnosis not present

## 2017-02-20 DIAGNOSIS — E7800 Pure hypercholesterolemia, unspecified: Secondary | ICD-10-CM | POA: Diagnosis not present

## 2017-02-20 DIAGNOSIS — Z86010 Personal history of colonic polyps: Secondary | ICD-10-CM | POA: Diagnosis not present

## 2017-02-20 DIAGNOSIS — K5730 Diverticulosis of large intestine without perforation or abscess without bleeding: Secondary | ICD-10-CM | POA: Diagnosis not present

## 2017-02-20 DIAGNOSIS — R5383 Other fatigue: Secondary | ICD-10-CM | POA: Diagnosis not present

## 2017-02-20 DIAGNOSIS — R609 Edema, unspecified: Secondary | ICD-10-CM | POA: Diagnosis not present

## 2017-02-20 DIAGNOSIS — R739 Hyperglycemia, unspecified: Secondary | ICD-10-CM | POA: Diagnosis not present

## 2017-02-20 DIAGNOSIS — Z7689 Persons encountering health services in other specified circumstances: Secondary | ICD-10-CM | POA: Diagnosis not present

## 2017-02-20 DIAGNOSIS — Z8041 Family history of malignant neoplasm of ovary: Secondary | ICD-10-CM | POA: Insufficient documentation

## 2017-02-20 DIAGNOSIS — J439 Emphysema, unspecified: Secondary | ICD-10-CM | POA: Diagnosis not present

## 2017-02-20 DIAGNOSIS — C801 Malignant (primary) neoplasm, unspecified: Secondary | ICD-10-CM

## 2017-02-20 DIAGNOSIS — Z1509 Genetic susceptibility to other malignant neoplasm: Secondary | ICD-10-CM | POA: Diagnosis not present

## 2017-02-20 DIAGNOSIS — Z79899 Other long term (current) drug therapy: Secondary | ICD-10-CM | POA: Insufficient documentation

## 2017-02-20 DIAGNOSIS — K449 Diaphragmatic hernia without obstruction or gangrene: Secondary | ICD-10-CM | POA: Insufficient documentation

## 2017-02-20 DIAGNOSIS — K551 Chronic vascular disorders of intestine: Secondary | ICD-10-CM | POA: Diagnosis not present

## 2017-02-20 DIAGNOSIS — I10 Essential (primary) hypertension: Secondary | ICD-10-CM

## 2017-02-20 DIAGNOSIS — Z483 Aftercare following surgery for neoplasm: Secondary | ICD-10-CM | POA: Diagnosis not present

## 2017-02-20 NOTE — Telephone Encounter (Signed)
[facility] is scheduled to see patient tomorrow and will contact office with BP readings, as needed .

## 2017-02-20 NOTE — Telephone Encounter (Signed)
Left message to return cal to office. 

## 2017-02-20 NOTE — Telephone Encounter (Signed)
Spoke with pt's husband. Pt currently at a doctors appt. Will call back after lunch.

## 2017-02-20 NOTE — Telephone Encounter (Signed)
Noted  

## 2017-02-20 NOTE — Progress Notes (Signed)
Patient states that she is extremely short of breath on exertion, which is new for her. She is planning to go to [facility] today to get her flu vaccine. While she was in the hospital,she states that she was taken off  her diuretic because her potassium was low, and now her ankles are swollen. She is waiting to hear back from Dr. Einar Pheasant about restarting the diuretic. Heart rate is slightly irregular today. She is otherwise doing well.

## 2017-02-20 NOTE — Telephone Encounter (Signed)
Per note, triamterene/hctz was stopped secondary to her low potassium.  Her potassium has been checked and wnl on the last two checks.  She needs to elevate legs.  Follow blood pressure.  If remains elevated, will have probably need to place back on triam/hctz (may start with 1/2 dose).  I would like for them to go back and check on her before Friday.  Need to know how her blood pressure is doing and how her breathing is doing.  Thanks.

## 2017-02-20 NOTE — Telephone Encounter (Signed)
-----   Message from [name], MD sent at 02/19/2017  9:44 AM EDT ----- Let the patient know that her biopsies of her colon showed thickening consistent with decreased blood flow which should heal on its own. The stomach showed some inflammation. If she has any further questions she can follow up with her primary gastroenterologist Dr. Gustavo Lah or myself.

## 2017-02-20 NOTE — Progress Notes (Addendum)
Hematology/Oncology Consult note [facility]  [phone] [phone]  Patient Care Team: Einar Pheasant, MD as PCP - General (Internal Medicine) Clent Jacks, RN as Registered Nurse   Name of the patient: Tina [name]  [id number]  08/25/1934   Date of visit: 02/20/17  Diagnosis- 1. New diagnosis of small cell cancer of the bladder 2. Secondary polycythemia 3. History of BRCA positivity   Chief complaint/ Reason for visit- discuss pathology results and further management  Heme/Onc history: Patient is a 82 year old female who sees me for 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 She has had jak 2 testing as well as EPO levels in the past which was unremarkable. She gets a phlebotomy when her hematocrit is more than 50. She was last seen by me in June 2018. She also has a history of lung nodules and was getting low-dose lung cancer screening scans on 07/04/2014 when her insurance stopped paying.   She recently presented to the hospital in October 2018 with symptoms of nausea and diarrhea weight loss and fatigue. She was noted to have irregular wall thickening in the ascending colon suspicious for colon cancer as well as 4.2 x 3.2 cm posterior bladder wall mass that was incidentally noted. She was also seen by vascular surgery and underwent abdominal angiogram with stent placement at the [facility]. She is on aspirin and Plavix for the same. Patient underwent colonoscopy for concerns of ischemic colitis which showed irregular thickening of the descending colon consistent with ischemic colitis. EGD showed a small hiatal hernia. Losing gastric ulcers were noted which was treated with argon photocoagulation.  With regards to the posterior bladder wall mass she was seen by Dr. Erlene Quan and underwent TURBT with bilateral ureteral stent placement on 02/13/2017. Pathology showed small cell carcinoma with muscularis propria invasion  Interval history-  She is here to discuss further management for newly diagnosed small cell cancer of her bladder. She lives with her son and is independent of her ADL's. She has a urinary catheter in place. Feels fatigued. Denies any hematuria, abdominal pain nausea or vomiting. She does have b/l LE edema. Her diuretics were held during her hospital stay and she is waiting to hear back from Dr. Nicki Reaper if she can restart it   ECOG PS- 1 Pain scale- 0   Review of systems- Review of Systems  Constitutional: Positive for malaise/fatigue. Negative for chills, fever and weight loss.  HENT: Negative for congestion, ear discharge and nosebleeds.   Eyes: Negative for blurred vision.  Respiratory: Negative for cough, hemoptysis, sputum production, shortness of breath and wheezing.   Cardiovascular: Negative for chest pain, palpitations, orthopnea and claudication.  Gastrointestinal: Negative for abdominal pain, blood in stool, constipation, diarrhea, heartburn, melena, nausea and vomiting.  Genitourinary: Negative for dysuria, flank pain, frequency, hematuria and urgency.  Musculoskeletal: Negative for back pain, joint pain and myalgias.  Skin: Negative for rash.  Neurological: Negative for dizziness, tingling, focal weakness, seizures, weakness and headaches.  Endo/Heme/Allergies: Does not bruise/bleed easily.  Psychiatric/Behavioral: Negative for depression and suicidal ideas. The patient does not have insomnia.        Allergies  Allergen Reactions  . Evista [Raloxifene] Other (See Comments)    Night sweats Night sweats Reaction:  Night sweats   . Fluticasone-Salmeterol Other (See Comments)    Reaction:  Cough      Past Medical History:  Diagnosis Date  . Anemia   . Asthma   . BRCA positive   .  CAD (coronary artery disease)   . COPD (chronic obstructive pulmonary disease) ([place])   . Emphysema lung ([place])   . GERD (gastroesophageal reflux disease)   . History of colon polyps   . Hypercholesterolemia     . Hyperglycemia   . Hypertension   . Lung nodules   . Osteoporosis   . Personal history of tobacco use, presenting hazards to health 11/25/2014  . Polycythemia vera(238.4)   . Renal cyst   . Skin cancer      Past Surgical History:  Procedure Laterality Date  . ANGIOPLASTY     coronary (x1)  . COLONOSCOPY WITH PROPOFOL N/A 03/02/2015   Procedure: COLONOSCOPY WITH PROPOFOL;  Surgeon: Lollie Sails, MD;  Location: [facility] ENDOSCOPY;  Service: Endoscopy;  Laterality: N/A;  . COLONOSCOPY WITH PROPOFOL N/A 02/13/2017   Procedure: COLONOSCOPY WITH PROPOFOL;  Surgeon: [name], MD;  Location: [facility] ENDOSCOPY;  Service: Endoscopy;  Laterality: N/A;  . CORONARY ANGIOPLASTY    . CORONARY ARTERY BYPASS GRAFT    . CYSTOSCOPY W/ RETROGRADES Bilateral 02/13/2017   Procedure: CYSTOSCOPY WITH RETROGRADE PYELOGRAM;  Surgeon: Hollice Espy, MD;  Location: [facility] ORS;  Service: Urology;  Laterality: Bilateral;  . CYSTOSCOPY WITH STENT PLACEMENT Bilateral 02/13/2017   Procedure: CYSTOSCOPY WITH STENT PLACEMENT;  Surgeon: Hollice Espy, MD;  Location: [facility] ORS;  Service: Urology;  Laterality: Bilateral;  . ESOPHAGOGASTRODUODENOSCOPY (EGD) WITH PROPOFOL N/A 02/13/2017   Procedure: ESOPHAGOGASTRODUODENOSCOPY (EGD) WITH PROPOFOL;  Surgeon: [name], MD;  Location: [facility] ENDOSCOPY;  Service: Endoscopy;  Laterality: N/A;  . SALPINGOOPHORECTOMY    . TONSILECTOMY/ADENOIDECTOMY WITH MYRINGOTOMY    . TRANSURETHRAL RESECTION OF BLADDER TUMOR N/A 02/13/2017   Procedure: TRANSURETHRAL RESECTION OF BLADDER TUMOR (TURBT);  Surgeon: Hollice Espy, MD;  Location: [facility] ORS;  Service: Urology;  Laterality: N/A;  . TUBAL LIGATION    . UPPER GI ENDOSCOPY  02/13/2017  . VISCERAL ARTERY INTERVENTION N/A 02/12/2017   Procedure: VISCERAL ARTERY INTERVENTION;  Surgeon: Algernon Huxley, MD;  Location: [place] CV LAB;  Service: Cardiovascular;  Laterality: N/A;    Social History   Social History  . Marital status:  Single    Spouse name: N/A  . Number of children: 2  . Years of education: N/A   Occupational History  . Not on file.   Social History Main Topics  . Smoking status: Former Smoker    Packs/day: 1.00    Years: 45.00    Types: Cigarettes    Quit date: 11/11/2003  . Smokeless tobacco: Never Used  . Alcohol use No     Comment: occasional  . Drug use: No  . Sexual activity: No   Other Topics Concern  . Not on file   Social History Narrative  . No narrative on file    Family History  Problem Relation Age of Onset  . Cirrhosis Father        died age 58  . Alcohol abuse Father   . Asthma Mother   . Congestive Heart Failure Mother   . Breast cancer Mother        2 (1/2 sisters)  . Osteoarthritis Mother   . Colon cancer Mother   . Lupus Sister   . Alcohol abuse Sister   . Ovarian cancer Sister   . Osteoporosis Sister   . Skin cancer Sister   . Breast cancer Cousin   . Breast cancer Maternal Aunt      Current Outpatient Prescriptions:  .  albuterol (PROVENTIL HFA;VENTOLIN HFA)  108 (90 Base) MCG/ACT inhaler, Inhale 2 puffs into the lungs every 6 (six) hours as needed for wheezing or shortness of breath., Disp: 1 Inhaler, Rfl: 2 .  amLODipine (NORVASC) 5 MG tablet, TAKE 1 TABLET BY MOUTH  DAILY, Disp: 90 tablet, Rfl: 1 .  amoxicillin-clavulanate (AUGMENTIN) 875-125 MG tablet, Take 1 tablet by mouth every 12 (twelve) hours., Disp: 4 tablet, Rfl: 0 .  aspirin EC 81 MG EC tablet, Take 1 tablet (81 mg total) by mouth daily., Disp: 30 tablet, Rfl: 1 .  cholecalciferol (VITAMIN D) 1000 units tablet, Take 1,000 Units by mouth daily., Disp: , Rfl:  .  clopidogrel (PLAVIX) 75 MG tablet, Take 1 tablet (75 mg total) by mouth daily., Disp: 30 tablet, Rfl: 2 .  feeding supplement, ENSURE ENLIVE, (ENSURE ENLIVE) LIQD, Take 237 mLs by mouth 2 (two) times daily between meals., Disp: 237 mL, Rfl: 12 .  fluticasone (FLONASE) 50 MCG/ACT nasal spray, Place 2 sprays into both nostrils daily.,  Disp: 16 g, Rfl: 5 .  ipratropium-albuterol (DUONEB) 0.5-2.5 (3) MG/3ML SOLN, Take 3 mLs by nebulization every 6 (six) hours as needed (for shortness of breath)., Disp: 36 mL, Rfl: 0 .  losartan (COZAAR) 100 MG tablet, TAKE 1 TABLET BY MOUTH  DAILY, Disp: 90 tablet, Rfl: 1 .  lovastatin (MEVACOR) 40 MG tablet, TAKE 1 TABLET BY MOUTH  DAILY, Disp: 90 tablet, Rfl: 1 .  metoprolol tartrate (LOPRESSOR) 25 MG tablet, TAKE ONE-HALF TABLET BY  MOUTH TWO TIMES DAILY, Disp: 90 tablet, Rfl: 1 .  ondansetron (ZOFRAN) 4 MG tablet, Take 1 tablet (4 mg total) by mouth every 8 (eight) hours as needed for nausea or vomiting., Disp: 20 tablet, Rfl: 0 .  oxybutynin (DITROPAN) 5 MG tablet, Take 1 tablet (5 mg total) by mouth every 8 (eight) hours as needed for bladder spasms., Disp: 30 tablet, Rfl: 0 .  oxyCODONE-acetaminophen (PERCOCET/ROXICET) 5-325 MG tablet, Take 1-2 tablets by mouth every 6 (six) hours as needed for moderate pain., Disp: 30 tablet, Rfl: 0 .  pantoprazole (PROTONIX) 40 MG tablet, Take 1 tablet (40 mg total) by mouth daily., Disp: 30 tablet, Rfl: 1 .  potassium chloride (K-DUR) 10 MEQ tablet, Take 1 tablet twice a day for two days , then take 1 tablet daily then on., Disp: 30 tablet, Rfl: 0 .  SYMBICORT 160-4.5 MCG/ACT inhaler, USE 2 PUFFS TWO TIMES DAILY, Disp: 30.6 g, Rfl: 1  Physical exam:  Vitals:   02/20/17 0924  BP: (!) 156/66  Pulse: 78  Resp: 18  Temp: 99.3 F (37.4 C)  TempSrc: Tympanic  SpO2: 98%  Weight: 140 lb (63.5 kg)  Height: 5' 4.5" (1.638 m)   Physical Exam  Constitutional: She is oriented to person, place, and time.  Elderly woman in no acute distress  HENT:  Head: Normocephalic and atraumatic.  Eyes: Pupils are equal, round, and reactive to light. EOM are normal.  Neck: Normal range of motion.  Cardiovascular: Regular rhythm and normal heart sounds.   Tachycardic, regular. Occasional pvcs   Pulmonary/Chest: Effort normal and breath sounds normal.  Abdominal:  Soft. Bowel sounds are normal.  Urinary catheter in place draining clear urine  Musculoskeletal: She exhibits edema.  Neurological: She is alert and oriented to person, place, and time.  Skin: Skin is warm and dry.     CMP Latest Ref Rng & Units 02/14/2017  Glucose 65 - 99 mg/dL -  BUN 6 - 20 mg/dL -  Creatinine 0.44 - 1.00 mg/dL -  Sodium 135 - 145 mmol/L -  Potassium 3.5 - 5.1 mmol/L 4.1  Chloride 101 - 111 mmol/L -  CO2 22 - 32 mmol/L -  Calcium 8.9 - 10.3 mg/dL -  Total Protein 6.5 - 8.1 g/dL -  Total Bilirubin 0.3 - 1.2 mg/dL -  Alkaline Phos 38 - 126 U/L -  AST 15 - 41 U/L -  ALT 14 - 54 U/L -   CBC Latest Ref Rng & Units 02/14/2017  WBC 3.6 - 11.0 K/uL 18.8(H)  Hemoglobin 12.0 - 16.0 g/dL 13.0  Hematocrit 35.0 - 47.0 % 38.8  Platelets 150 - 440 K/uL 218    No images are attached to the encounter.  Dg Chest Portable 1 View  Result Date: 02/11/2017 CLINICAL DATA:  Epigastric abdominal pain. EXAM: PORTABLE CHEST 1 VIEW COMPARISON:  Radiographs of August 21, 2016. FINDINGS: The heart size and mediastinal contours are within normal limits. Atherosclerosis of thoracic aorta is noted. No pneumothorax or pleural effusion is noted. Right lung is clear. Mild left basilar subsegmental atelectasis is noted. The visualized skeletal structures are unremarkable. IMPRESSION: Mild left basilar subsegmental atelectasis.  Aortic atherosclerosis. Electronically Signed   By: Marijo Conception, M.D.   On: 02/11/2017 16:34   Ct Angio Abd/pel W/ And/or W/o  Result Date: 02/11/2017 CLINICAL DATA:  Nausea and abdominal pain after eating. Clinical concern for vascular insufficiency. EXAM: CTA ABDOMEN AND PELVIS WITHOUT AND WITH CONTRAST TECHNIQUE: Multidetector CT imaging of the abdomen and pelvis was performed using the standard protocol during bolus administration of intravenous contrast. Multiplanar reconstructed images and MIPs were obtained and reviewed to evaluate the vascular anatomy. CONTRAST:  75  cc Isovue 370 COMPARISON:  Chest, abdomen and pelvis CT dated 01/14/2009. FINDINGS: VASCULAR Aorta: Marked calcified and noncalcified plaque formation in the anterior aspect of the proximal abdominal aorta at the level of the celiac axis and superior mesenteric artery origins. Additional calcified plaque formation elsewhere in the aorta. No aortic aneurysm or dissection seen. 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 There is collateral reconstitution distal to the occlusion. SMA: Marked calcified and noncalcified plaque formation at the origin with complete or near complete occlusion of a 3 mm segment of the proximal superior mesenteric artery. Renals: Single right renal artery with plaque formation at the origin producing approximately 50% stenosis. One anterior and 1 posterior left renal artery with plaque formation at the origin of both. There is approximately 80% stenosis of the proximal portion of the anterior artery at its origin. The posterior artery is completely occluded over a 3 mm long segment proximally. IMA: Calcified and noncalcified plaque formation at the origin with complete or near complete occlusion. Inflow: Mild bilateral iliac calcified plaque formation without significant stenosis on either side. Proximal Outflow: Normal appearing proximal common femoral, femoral and profunda femoral arteries. Veins: No venous thrombosis demonstrated. Review of the MIP images confirms the above findings. NON-VASCULAR Lower chest: Linear atelectasis or scarring at both lung bases. Hepatobiliary: No focal liver abnormality is seen. No gallstones, gallbladder wall thickening, or biliary dilatation. Pancreas: Unremarkable. No pancreatic ductal dilatation or surrounding inflammatory changes. Spleen: Normal in size without focal abnormality. Adrenals/Urinary Tract: Bilateral renal cysts. Mildly irregular and heterogeneous  posterior bladder mass, measuring 4.7 x 3.0 cm on image number 168 of series 4. This measures 3.2 cm in length on sagittal image number 85 series seen. Unremarkable ureters. No urinary tract calculi  or hydronephrosis. Mild bilateral adrenal hyperplasia. Stomach/Bowel: Small hiatal hernia. Mild sigmoid colon diverticulosis. Poorly distended ascending colon with some irregular wall thickening. Unremarkable small bowel. Lymphatic: No enlarged lymph nodes. Reproductive: Uterus and bilateral adnexa are unremarkable. Other: No abdominal wall hernia or abnormality. No abdominopelvic ascites. Musculoskeletal: Approximately 20% L2 superior endplate compression deformity with Schmorl's node formation. No acute fracture lines or bony retropulsion. Mild lumbar and lower thoracic spine degenerative changes. IMPRESSION: VASCULAR 1. Marked densely calcified and noncalcified plaque formation in the anterior aspect of the proximal abdominal aorta at the origins of the celiac axis and superior mesenteric artery. 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 2. Plaque formation causing complete or near complete occlusion of the inferior mesenteric artery at its origin. 3. Plaque formation causing complete occlusion of a 3 mm long segment of the proximal portion of the posterior left renal artery and plaque formation causing approximately 80% stenosis of the proximal portion of the anterior left renal artery. 4. Plaque formation causing approximately 50% stenosis of the origin of the right renal artery. 5. Catheter angiography and stent placement as indicated at that time is recommended. NON-VASCULAR 1. **An incidental finding of potential clinical significance has been found. 4.7 x 3.2 x 3.0 cm posterior bladder mass. This is compatible with a primary bladder malignancy. 2. An incidental finding of  potential clinical significance has been found. Irregular wall thickening involving the poorly distended ascending colon segment, suspicious for the possibility of colon cancer. Correlation with colonoscopy is recommended. 3. Small hiatal hernia. 4. Mild sigmoid diverticulosis. Electronically Signed   By: Claudie Revering M.D.   On: 02/11/2017 17:34     Assessment and plan- Patient is a 82 y.o. 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 I have personally reviewed the CT abdomen imaging and discussed those findings with the patient. Also discussed the results of pathology with her in detail  2. Small cell cancer of the bladder- she will need complete staging scans including PET/CT and mri brain with and without contrast to rule out metastatic disease  A. If she does not have evidence of metastatic disease on scans- I will favor giving her neoadjuvant chemotherapy with carboplatin and etoposide (will skip cisplatin due to her age and concern for kidney issues) Q3 weeks IV X 4 cycles. Discussed the risks and benefits of chemotherapy including all but not limited to nausea, vomiting, low blood counts, risk of infections and transfusions. Patient understands and agrees to proceed as planned.   This will be followed by cystectomy- if she is a surgical candidate - which I doubt given her age and comorbidities or radiation therapy fi she is not a surgical candidate  B. If she has metastatic disease on scans- I will proceed with palliative chemotherapy with carboplatin and etoposide  3. We will touch base with Dr. Lucky Cowboy regarding port placement since she is on aspirin and plavix and recent stnt placement at [facility]  4. She will attend chemo class within next week  I will see her in 1 weeks time to discuss the results of her scans and further management with labs cbc, cmp. Intent of chemotherapy- palliative or curative will depend on extent of disease  We will discuss her  case at tumor board next week after scans  Patient understands the above plan and  agrees to proceed as planned   Total face to face encounter time for this patient visit was 45 min. >50% of the time was  spent in counseling and coordination of care.     Visit Diagnosis 1. Malignant neoplasm of urinary bladder, unspecified site ([place])   2. Malignant small cell cancer ([facility])      Dr. Randa Evens, MD, MPH [facility] at [facility] Pager- [phone] 02/20/2017 10:41 AM

## 2017-02-20 NOTE — Telephone Encounter (Signed)
Spoke with Tina [name] today [facility] [phone] patients BP 02/19/17 144/68 she seemed short of breath, however she was active, but patient denied symptoms, feet were swollen. Discharge instructions stated to take Potassium 1 bid , then 1 daily no longer on triamterene.  She should be on triamterene.  Does she need to go out to re-visit patient before Friday? She will be seeing on Friday.

## 2017-02-21 ENCOUNTER — Encounter (INDEPENDENT_AMBULATORY_CARE_PROVIDER_SITE_OTHER): Payer: Self-pay

## 2017-02-21 ENCOUNTER — Telehealth: Payer: Self-pay | Admitting: Internal Medicine

## 2017-02-21 ENCOUNTER — Telehealth: Payer: Self-pay | Admitting: *Deleted

## 2017-02-21 ENCOUNTER — Other Ambulatory Visit: Payer: Medicare Other

## 2017-02-21 DIAGNOSIS — J449 Chronic obstructive pulmonary disease, unspecified: Secondary | ICD-10-CM | POA: Diagnosis not present

## 2017-02-21 DIAGNOSIS — C679 Malignant neoplasm of bladder, unspecified: Secondary | ICD-10-CM | POA: Diagnosis not present

## 2017-02-21 DIAGNOSIS — I10 Essential (primary) hypertension: Secondary | ICD-10-CM | POA: Diagnosis not present

## 2017-02-21 DIAGNOSIS — I2510 Atherosclerotic heart disease of native coronary artery without angina pectoris: Secondary | ICD-10-CM | POA: Diagnosis not present

## 2017-02-21 DIAGNOSIS — Z483 Aftercare following surgery for neoplasm: Secondary | ICD-10-CM | POA: Diagnosis not present

## 2017-02-21 DIAGNOSIS — K551 Chronic vascular disorders of intestine: Secondary | ICD-10-CM | POA: Diagnosis not present

## 2017-02-21 NOTE — Telephone Encounter (Signed)
Called pt to let her know that her port will be placed by Dr. Lucky Cowboy 10/17 [facility] 9:15 and register. NPO after midnight, meds with sip of water.  She will stay on plavix and ASA per Dr. Lucky Cowboy.  She will need to come with driver to take her back home. She will be there she said

## 2017-02-21 NOTE — Telephone Encounter (Signed)
[name] from [facility] called and stated that pt is out of her potassium and pt was wondering if she needed to continue with them if so she needs a refill. Also [name] mentioned that the physical therapist called and said that both patient's legs are completely red and they are afraid of cellulitis. Please advise, thank you!  Call [name] @ [phone]

## 2017-02-21 NOTE — Telephone Encounter (Signed)
Will determine if needs to start back on medication, based on how her blood pressure readings are doing.  Will f/u with her potassium check here in the office.

## 2017-02-21 NOTE — Telephone Encounter (Signed)
Please return a call to Carlota Raspberry at [facility] @[phone]

## 2017-02-21 NOTE — Telephone Encounter (Signed)
Spoke with Tina [name] from [facility]. Nurse is going out this afternoon to do an assessment and is supposed to call us with BP reading and assessment notes. Spoke with patient to let her know that you would like for her to stay off of diuretic and potassium until her appointment in our office on Friday. Patient gave verbal understanding and would wait to see you on Friday. Also, asked patient about her legs. She says they are a little red but swelling is going down. Waiting for phone call from nurse after they evaluate her.

## 2017-02-21 NOTE — Telephone Encounter (Signed)
Spoke with [name] and made her aware that we are holding potassium until Friday.

## 2017-02-22 ENCOUNTER — Ambulatory Visit (INDEPENDENT_AMBULATORY_CARE_PROVIDER_SITE_OTHER): Payer: Medicare Other | Admitting: Urology

## 2017-02-22 ENCOUNTER — Encounter: Payer: Self-pay | Admitting: Urology

## 2017-02-22 VITALS — BP 168/68 | HR 76 | Ht 64.5 in | Wt 137.6 lb

## 2017-02-22 DIAGNOSIS — C670 Malignant neoplasm of trigone of bladder: Secondary | ICD-10-CM

## 2017-02-22 DIAGNOSIS — N135 Crossing vessel and stricture of ureter without hydronephrosis: Secondary | ICD-10-CM

## 2017-02-22 DIAGNOSIS — N183 Chronic kidney disease, stage 3 unspecified: Secondary | ICD-10-CM

## 2017-02-22 DIAGNOSIS — I2510 Atherosclerotic heart disease of native coronary artery without angina pectoris: Secondary | ICD-10-CM

## 2017-02-22 NOTE — Telephone Encounter (Signed)
Lattie Haw from [facility] called. She says that patients legs look better, they are slightly red but not warm to touch. She had trace edema in her feet but not an excessive amount. BP was 162/70. She is holding diuretic and potassium and will see you tomorrow.

## 2017-02-22 NOTE — Progress Notes (Signed)
02/22/2017 8:24 AM   Tina [name] [birth year] [id number]  Referring provider: Einar Pheasant, MD [address] [place], [place] [address]  Chief Complaint  Patient presents with  . Bladder Cancer   HPI: 82 year old female seen during recent hospital admission found to have an incidental bladder mass who underwent TURBT, bilateral ureteral stent placement on 02/14/2017. At that time and, the tumor was noted to be extensive, greater than 5 cm with a firm nodular mass involving the trigone and posterior bladder wall.  A Foley catheter was placed at the end of the procedure due to extensive resection of the tumor. The tumor was debulked but not completely resected.  Surgical pathology is consistent with small cell carcinoma of the bladder, invasive into the muscularis propria. Patient has been seen and evaluated by Dr. Janese Banks and further staging including PET scan and brain MR have been ordered. She is scheduled to get a port and start chemotherapy in the near future.  Foley catheter removed today. Her urine is been mostly clear. Overall, she's feeling better than during her admission. Her abdominal pain subsided. She will be on chronic platelet therapy following her episode of bowel ischemia and percutaneous intervention.   PMH: Past Medical History:  Diagnosis Date  . Anemia   . Asthma   . BRCA positive   . CAD (coronary artery disease)   . COPD (chronic obstructive pulmonary disease) ([place])   . Emphysema lung ([place])   . GERD (gastroesophageal reflux disease)   . History of colon polyps   . Hypercholesterolemia   . Hyperglycemia   . Hypertension   . Lung nodules   . Osteoporosis   . Personal history of tobacco use, presenting hazards to health 11/25/2014  . Polycythemia vera(238.4)   . Renal cyst   . Skin cancer     Surgical History: Past Surgical History:  Procedure Laterality Date  . ANGIOPLASTY     coronary (x1)  . COLONOSCOPY WITH PROPOFOL N/A  03/02/2015   Procedure: COLONOSCOPY WITH PROPOFOL;  Surgeon: Lollie Sails, MD;  Location: [facility] ENDOSCOPY;  Service: Endoscopy;  Laterality: N/A;  . COLONOSCOPY WITH PROPOFOL N/A 02/13/2017   Procedure: COLONOSCOPY WITH PROPOFOL;  Surgeon: [name], MD;  Location: [facility] ENDOSCOPY;  Service: Endoscopy;  Laterality: N/A;  . CORONARY ANGIOPLASTY    . CORONARY ARTERY BYPASS GRAFT    . CYSTOSCOPY W/ RETROGRADES Bilateral 02/13/2017   Procedure: CYSTOSCOPY WITH RETROGRADE PYELOGRAM;  Surgeon: Hollice Espy, MD;  Location: [facility] ORS;  Service: Urology;  Laterality: Bilateral;  . CYSTOSCOPY WITH STENT PLACEMENT Bilateral 02/13/2017   Procedure: CYSTOSCOPY WITH STENT PLACEMENT;  Surgeon: Hollice Espy, MD;  Location: [facility] ORS;  Service: Urology;  Laterality: Bilateral;  . ESOPHAGOGASTRODUODENOSCOPY (EGD) WITH PROPOFOL N/A 02/13/2017   Procedure: ESOPHAGOGASTRODUODENOSCOPY (EGD) WITH PROPOFOL;  Surgeon: [name], MD;  Location[facility] END[facility]COPY;  Service: Endoscopy;  Laterality: N/A;  . SALPINGOOPHORECTOMY    . TONSILECTOMY/ADENOIDECTOMY WITH MYRINGOTOMY    . TRANSURETHRAL RESECTION OF BLADDER TUMOR N/A 02/13/2017   Procedure: TRANSURETHRAL RESECTION OF BLADDER TUMOR (TURBT);  Surgeon: Hollice Espy, MD;  Location: [facility] ORS;  Service: Urology;  Laterality: N/A;  . TUBAL LIGATION    . UPPER GI ENDOSCOPY  02/13/2017  . VISCERAL ARTERY INTERVENTION N/A 02/12/2017   Procedure: VISCERAL ARTERY INTERVENTION;  Surgeon: Algernon Huxley, MD;  Location: [facility] LAB;  Service: Cardiovascular;  Laterality: N/A;    Home Medications:  Allergies as of 02/22/2017      Reactions  Evista [raloxifene] Other (See Comments)   Night sweats Night sweats Reaction:  Night sweats    Fluticasone-salmeterol Other (See Comments)   Reaction:  Cough       Medication List       Accurate as of 02/22/17 11:59 PM. Always use your most recent med list.          albuterol 108 (90 Base) MCG/ACT  inhaler Commonly known as:  PROVENTIL HFA;VENTOLIN HFA Inhale 2 puffs into the lungs every 6 (six) hours as needed for wheezing or shortness of breath.   amLODipine 5 MG tablet Commonly known as:  NORVASC TAKE 1 TABLET BY MOUTH  DAILY   aspirin 81 MG EC tablet Take 1 tablet (81 mg total) by mouth daily.   calcium carbonate 500 MG chewable tablet Commonly known as:  TUMS - dosed in mg elemental calcium Chew 1,500 mg by mouth once a week.   cholecalciferol 1000 units tablet Commonly known as:  VITAMIN D Take 1,000 Units by mouth daily.   clopidogrel 75 MG tablet Commonly known as:  PLAVIX Take 1 tablet (75 mg total) by mouth daily.   fluticasone 50 MCG/ACT nasal spray Commonly known as:  FLONASE Place 2 sprays into both nostrils daily.   ipratropium-albuterol 0.5-2.5 (3) MG/3ML Soln Commonly known as:  DUONEB Take 3 mLs by nebulization every 6 (six) hours as needed (for shortness of breath).   losartan 100 MG tablet Commonly known as:  COZAAR TAKE 1 TABLET BY MOUTH  DAILY   lovastatin 40 MG tablet Commonly known as:  MEVACOR TAKE 1 TABLET BY MOUTH  DAILY   metoprolol tartrate 25 MG tablet Commonly known as:  LOPRESSOR TAKE ONE-HALF TABLET BY  MOUTH TWO TIMES DAILY   ondansetron 4 MG tablet Commonly known as:  ZOFRAN Take 1 tablet (4 mg total) by mouth every 8 (eight) hours as needed for nausea or vomiting.   oxybutynin 5 MG tablet Commonly known as:  DITROPAN Take 1 tablet (5 mg total) by mouth every 8 (eight) hours as needed for bladder spasms.   oxyCODONE-acetaminophen 5-325 MG tablet Commonly known as:  PERCOCET/ROXICET Take 1-2 tablets by mouth every 6 (six) hours as needed for moderate pain.   pantoprazole 40 MG tablet Commonly known as:  PROTONIX Take 1 tablet (40 mg total) by mouth daily.   potassium chloride 10 MEQ tablet Commonly known as:  K-DUR Take 1 tablet twice a day for two days , then take 1 tablet daily then on.   SYMBICORT 160-4.5 MCG/ACT  inhaler Generic drug:  budesonide-formoterol USE 2 PUFFS TWO TIMES DAILY       Allergies:  Allergies  Allergen Reactions  . Evista [Raloxifene] Other (See Comments)    Night sweats Night sweats Reaction:  Night sweats   . Fluticasone-Salmeterol Other (See Comments)    Reaction:  Cough     Family History: Family History  Problem Relation Age of Onset  . Cirrhosis Father        died age 58  . Alcohol abuse Father   . Asthma Mother   . Congestive Heart Failure Mother   . Breast cancer Mother        2 (1/2 sisters)  . Osteoarthritis Mother   . Colon cancer Mother   . Lupus Sister   . Alcohol abuse Sister   . Ovarian cancer Sister   . Osteoporosis Sister   . Skin cancer Sister   . Breast cancer Cousin   . Breast cancer Maternal Aunt  Social History:  reports that she quit smoking about 13 years ago. Her smoking use included Cigarettes. She has a 45.00 pack-year smoking history. She has never used smokeless tobacco. She reports that she does not drink alcohol or use drugs.  ROS: UROLOGY Frequent Urination?: No Hard to postpone urination?: No Burning/pain with urination?: No Get up at night to urinate?: No Leakage of urine?: No Urine stream starts and stops?: No Trouble starting stream?: No Do you have to strain to urinate?: No Blood in urine?: No Urinary tract infection?: No Sexually transmitted disease?: No Injury to kidneys or bladder?: No Painful intercourse?: No Weak stream?: No Currently pregnant?: No Vaginal bleeding?: No Last menstrual period?: n  Gastrointestinal Nausea?: No Vomiting?: No Indigestion/heartburn?: No Diarrhea?: No Constipation?: No  Constitutional Fever: No Night sweats?: No Weight loss?: No Fatigue?: No  Skin Skin rash/lesions?: No Itching?: No  Eyes Blurred vision?: No Double vision?: No  Ears/Nose/Throat Sore throat?: No Sinus problems?: No  Hematologic/Lymphatic Swollen glands?: No Easy bruising?:  No  Cardiovascular Leg swelling?: No Chest pain?: No  Respiratory Cough?: No Shortness of breath?: No  Endocrine Excessive thirst?: No  Musculoskeletal Back pain?: No Joint pain?: No  Neurological Headaches?: No Dizziness?: No  Psychologic Depression?: No Anxiety?: No  Physical Exam: BP (!) 168/68 (BP Location: Left Arm, Patient Position: Sitting, Cuff Size: Normal)   Pulse 76   Ht 5' 4.5" (1.638 m)   Wt 137 lb 9.6 oz (62.4 kg)   BMI 23.25 kg/m   Constitutional:  Alert and oriented, No acute distress. HEENT: [place] AT, moist mucus membranes.  Trachea midline, no masses. Cardiovascular: No clubbing, cyanosis, or edema. Respiratory: Normal respiratory effort, no increased work of breathing. GI: Abdomen is soft, nontender, nondistended, no abdominal masses GU: No CVA tenderness.  Skin: No rashes, bruises or suspicious lesions. Neurologic: Grossly intact, no focal deficits, moving all 4 extremities. Psychiatric: Normal mood and affect.  Laboratory Data: Lab Results  Component Value Date   WBC 18.8 (H) 02/14/2017   HGB 13.0 02/14/2017   HCT 38.8 02/14/2017   MCV 91.5 02/14/2017   PLT 218 02/14/2017    Lab Results  Component Value Date   CREATININE 0.88 02/13/2017    Lab Results  Component Value Date   HGBA1C 5.7 (H) 06/14/2016    Urinalysis N/a  Pertinent Imaging:  Assessment & Plan:    1. 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 Janese Banks Given her age and multiple medical comorbidities, it unlikely that she'll be a surgical candidate for cystectomy. We'll reevaluate her after chemotherapy since he how she does with this. All questions are answered.  2. Ureteral obstruction Tumor invading the trigone and compromising UOs Ureteral stents placed times  surgery The stents will need to be exchanged and hopefully ultimately removed if tumor response to chemotherapy We'll work with chemotherapy schedule, possibly after completing cycle 1 tablet before cycle 2 for stent exchange  3. CKD (chronic kidney disease), stage III (HCC) Creatinine stable, improved following stent placement   Schedule stent exchange  Hollice Espy, MD  [facility] [address] [place], [place] [address] [phone]  Case was discussed with pathologist as well as Dr. Janese Banks and in tumor board today.

## 2017-02-22 NOTE — Telephone Encounter (Signed)
Lattie Haw from [facility] requested a call  [phone]

## 2017-02-22 NOTE — Telephone Encounter (Signed)
Noted  

## 2017-02-23 ENCOUNTER — Other Ambulatory Visit (INDEPENDENT_AMBULATORY_CARE_PROVIDER_SITE_OTHER): Payer: Self-pay | Admitting: Vascular Surgery

## 2017-02-23 ENCOUNTER — Encounter: Payer: Medicare Other | Admitting: Internal Medicine

## 2017-02-27 ENCOUNTER — Ambulatory Visit
Admission: RE | Admit: 2017-02-27 | Discharge: 2017-02-27 | Disposition: A | Discharge status: Home / Self Care | Payer: Medicare Other | Source: Ambulatory Visit | Attending: Oncology | Admitting: Oncology

## 2017-02-27 ENCOUNTER — Ambulatory Visit: Payer: Medicare Other | Admitting: Oncology

## 2017-02-27 DIAGNOSIS — C801 Malignant (primary) neoplasm, unspecified: Secondary | ICD-10-CM

## 2017-02-27 DIAGNOSIS — C679 Malignant neoplasm of bladder, unspecified: Secondary | ICD-10-CM | POA: Insufficient documentation

## 2017-02-27 DIAGNOSIS — I700 Atherosclerosis of aorta: Secondary | ICD-10-CM | POA: Insufficient documentation

## 2017-02-27 DIAGNOSIS — I2510 Atherosclerotic heart disease of native coronary artery without angina pectoris: Secondary | ICD-10-CM | POA: Diagnosis not present

## 2017-02-27 LAB — GLUCOSE, CAPILLARY: GLUCOSE-CAPILLARY: 119 mg/dL — AB (ref 65–99)

## 2017-02-27 MED ORDER — FLUDEOXYGLUCOSE F - 18 (FDG) INJECTION
12.0000 | Freq: Once | INTRAVENOUS | Status: AC | PRN
  Administered 2017-02-27: 13.01 via INTRAVENOUS

## 2017-02-27 MED ORDER — GADOBENATE DIMEGLUMINE 529 MG/ML IV SOLN
10.0000 mL | Freq: Once | INTRAVENOUS | Status: AC | PRN
  Administered 2017-02-27: 10 mL via INTRAVENOUS

## 2017-02-27 MED ORDER — CEFAZOLIN SODIUM-DEXTROSE 2-4 GM/100ML-% IV SOLN
2.0000 g | Freq: Once | INTRAVENOUS | Status: AC
  Administered 2017-02-28: 2 g via INTRAVENOUS

## 2017-02-27 NOTE — Telephone Encounter (Signed)
Pt's son notified of colonoscopy and EGD results.

## 2017-02-28 ENCOUNTER — Ambulatory Visit
Admission: RE | Admit: 2017-02-28 | Discharge: 2017-02-28 | Disposition: A | Discharge status: Home / Self Care | Payer: Medicare Other | Source: Ambulatory Visit | Attending: Vascular Surgery | Admitting: Vascular Surgery

## 2017-02-28 ENCOUNTER — Encounter
Admission: RE | Disposition: A | Discharge status: Home / Self Care | Payer: Self-pay | Source: Ambulatory Visit | Attending: Vascular Surgery

## 2017-02-28 DIAGNOSIS — Z8262 Family history of osteoporosis: Secondary | ICD-10-CM | POA: Diagnosis not present

## 2017-02-28 DIAGNOSIS — Z951 Presence of aortocoronary bypass graft: Secondary | ICD-10-CM | POA: Insufficient documentation

## 2017-02-28 DIAGNOSIS — Z483 Aftercare following surgery for neoplasm: Secondary | ICD-10-CM | POA: Diagnosis not present

## 2017-02-28 DIAGNOSIS — Z9889 Other specified postprocedural states: Secondary | ICD-10-CM | POA: Insufficient documentation

## 2017-02-28 DIAGNOSIS — Z7982 Long term (current) use of aspirin: Secondary | ICD-10-CM | POA: Insufficient documentation

## 2017-02-28 DIAGNOSIS — Z811 Family history of alcohol abuse and dependence: Secondary | ICD-10-CM | POA: Diagnosis not present

## 2017-02-28 DIAGNOSIS — C679 Malignant neoplasm of bladder, unspecified: Secondary | ICD-10-CM | POA: Insufficient documentation

## 2017-02-28 DIAGNOSIS — Z90722 Acquired absence of ovaries, bilateral: Secondary | ICD-10-CM | POA: Insufficient documentation

## 2017-02-28 DIAGNOSIS — Z808 Family history of malignant neoplasm of other organs or systems: Secondary | ICD-10-CM | POA: Insufficient documentation

## 2017-02-28 DIAGNOSIS — Z888 Allergy status to other drugs, medicaments and biological substances status: Secondary | ICD-10-CM | POA: Insufficient documentation

## 2017-02-28 DIAGNOSIS — Z86010 Personal history of colonic polyps: Secondary | ICD-10-CM | POA: Diagnosis not present

## 2017-02-28 DIAGNOSIS — Z9851 Tubal ligation status: Secondary | ICD-10-CM | POA: Insufficient documentation

## 2017-02-28 DIAGNOSIS — I129 Hypertensive chronic kidney disease with stage 1 through stage 4 chronic kidney disease, or unspecified chronic kidney disease: Secondary | ICD-10-CM | POA: Insufficient documentation

## 2017-02-28 DIAGNOSIS — Z800 Family history of malignant neoplasm of digestive organs: Secondary | ICD-10-CM | POA: Diagnosis not present

## 2017-02-28 DIAGNOSIS — Z803 Family history of malignant neoplasm of breast: Secondary | ICD-10-CM | POA: Insufficient documentation

## 2017-02-28 DIAGNOSIS — I2510 Atherosclerotic heart disease of native coronary artery without angina pectoris: Secondary | ICD-10-CM | POA: Diagnosis not present

## 2017-02-28 DIAGNOSIS — J449 Chronic obstructive pulmonary disease, unspecified: Secondary | ICD-10-CM | POA: Diagnosis not present

## 2017-02-28 DIAGNOSIS — Z955 Presence of coronary angioplasty implant and graft: Secondary | ICD-10-CM | POA: Insufficient documentation

## 2017-02-28 DIAGNOSIS — Z825 Family history of asthma and other chronic lower respiratory diseases: Secondary | ICD-10-CM | POA: Insufficient documentation

## 2017-02-28 DIAGNOSIS — N183 Chronic kidney disease, stage 3 (moderate): Secondary | ICD-10-CM | POA: Diagnosis not present

## 2017-02-28 DIAGNOSIS — Z8041 Family history of malignant neoplasm of ovary: Secondary | ICD-10-CM | POA: Insufficient documentation

## 2017-02-28 DIAGNOSIS — I10 Essential (primary) hypertension: Secondary | ICD-10-CM | POA: Diagnosis not present

## 2017-02-28 DIAGNOSIS — N135 Crossing vessel and stricture of ureter without hydronephrosis: Secondary | ICD-10-CM | POA: Insufficient documentation

## 2017-02-28 DIAGNOSIS — K219 Gastro-esophageal reflux disease without esophagitis: Secondary | ICD-10-CM | POA: Insufficient documentation

## 2017-02-28 DIAGNOSIS — Z85828 Personal history of other malignant neoplasm of skin: Secondary | ICD-10-CM | POA: Diagnosis not present

## 2017-02-28 DIAGNOSIS — K551 Chronic vascular disorders of intestine: Secondary | ICD-10-CM | POA: Diagnosis not present

## 2017-02-28 DIAGNOSIS — M810 Age-related osteoporosis without current pathological fracture: Secondary | ICD-10-CM | POA: Diagnosis not present

## 2017-02-28 HISTORY — PX: PORTA CATH INSERTION: CATH118285

## 2017-02-28 SURGERY — PORTA CATH INSERTION
Anesthesia: Moderate Sedation

## 2017-02-28 MED ORDER — ONDANSETRON HCL 4 MG/2ML IJ SOLN: 4.0000 mg | Freq: Four times a day (QID) | INTRAMUSCULAR | Status: DC | PRN

## 2017-02-28 MED ORDER — FENTANYL CITRATE (PF) 100 MCG/2ML IJ SOLN
INTRAMUSCULAR | Status: AC
  Filled 2017-02-28: qty 2

## 2017-02-28 MED ORDER — IOPAMIDOL (ISOVUE-300) INJECTION 61%
INTRAVENOUS | Status: DC | PRN
  Administered 2017-02-28: 10 mL via INTRAVENOUS

## 2017-02-28 MED ORDER — LIDOCAINE-EPINEPHRINE (PF) 1 %-1:200000 IJ SOLN
INTRAMUSCULAR | Status: AC
  Filled 2017-02-28: qty 30

## 2017-02-28 MED ORDER — HEPARIN (PORCINE) IN NACL 2-0.9 UNIT/ML-% IJ SOLN
INTRAMUSCULAR | Status: AC
Start: 2017-02-28 — End: ?
  Filled 2017-02-28: qty 500

## 2017-02-28 MED ORDER — SODIUM CHLORIDE 0.9 % IR SOLN
Freq: Once | Status: DC
  Filled 2017-02-28: qty 2

## 2017-02-28 MED ORDER — MIDAZOLAM HCL 5 MG/5ML IJ SOLN
INTRAMUSCULAR | Status: AC
  Filled 2017-02-28: qty 5

## 2017-02-28 MED ORDER — FENTANYL CITRATE (PF) 100 MCG/2ML IJ SOLN
INTRAMUSCULAR | Status: DC | PRN
  Administered 2017-02-28: 50 ug via INTRAVENOUS
  Administered 2017-02-28: 25 ug via INTRAVENOUS
  Administered 2017-02-28 (×2): 12.5 ug via INTRAVENOUS

## 2017-02-28 MED ORDER — SODIUM CHLORIDE 0.9 % IV SOLN
INTRAVENOUS | Status: DC
  Administered 2017-02-28: 10:00:00 via INTRAVENOUS

## 2017-02-28 MED ORDER — HYDROMORPHONE HCL 1 MG/ML IJ SOLN: 1.0000 mg | Freq: Once | INTRAMUSCULAR | Status: DC | PRN

## 2017-02-28 MED ORDER — MIDAZOLAM HCL 2 MG/2ML IJ SOLN
INTRAMUSCULAR | Status: DC | PRN
  Administered 2017-02-28 (×2): 1 mg via INTRAVENOUS
  Administered 2017-02-28: 2 mg via INTRAVENOUS
  Administered 2017-02-28: 1 mg via INTRAVENOUS

## 2017-02-28 MED ORDER — CEFAZOLIN SODIUM-DEXTROSE 2-4 GM/100ML-% IV SOLN
INTRAVENOUS | Status: AC
  Filled 2017-02-28: qty 100

## 2017-02-28 SURGICAL SUPPLY — 12 items
CANNULA 5F STIFF (CANNULA) ×3 IMPLANT
DERMABOND ADVANCED (GAUZE/BANDAGES/DRESSINGS) ×2
DERMABOND ADVANCED .7 DNX12 (GAUZE/BANDAGES/DRESSINGS) ×1 IMPLANT
KIT PORT POWER 8FR ISP CVUE (Miscellaneous) ×3 IMPLANT
PACK ANGIOGRAPHY (CUSTOM PROCEDURE TRAY) ×3 IMPLANT
PAD GROUND ADULT SPLIT (MISCELLANEOUS) ×3 IMPLANT
PENCIL ELECTRO HAND CTR (MISCELLANEOUS) ×3 IMPLANT
SPONGE XRAY 4X4 16PLY STRL (MISCELLANEOUS) ×6 IMPLANT
SUT MNCRL AB 4-0 PS2 18 (SUTURE) ×3 IMPLANT
SUT PROLENE 0 CT 1 30 (SUTURE) ×3 IMPLANT
SUTURE VIC 3-0 (SUTURE) ×3 IMPLANT
TOWEL OR 17X26 4PK STRL BLUE (TOWEL DISPOSABLE) ×6 IMPLANT

## 2017-02-28 NOTE — H&P (Signed)
[facility] History & Physical Update  The patient was interviewed and re-examined.  The patient's previous History and Physical has been reviewed and is unchanged.  There is no change in the plan of care. We plan to proceed with the scheduled procedure.  Leotis Pain, MD  02/28/2017, 10:16 AM

## 2017-02-28 NOTE — Op Note (Signed)
[facility]       Operative Note  Date: 02/28/2017  Preoperative diagnosis:  1. Bladder cancer  Postoperative diagnosis:  Same as above  Procedures: #1. Ultrasound guidance for vascular access to the right and left internal jugular veins. #2. Fluoroscopic guidance for placement of catheter. #3. Left jugular venogram #4. Placement of CT compatible Port-A-Cath, left internal jugular vein.  Surgeon: Leotis Pain, MD.   Anesthesia: Local with moderate conscious sedation for approximately 35  minutes using 5 mg of Versed and 100 mcg of Fentanyl  Fluoroscopy time: less than 1 minute  Contrast used: 10 cc  Estimated blood loss: 10 cc  Indication for the procedure:  The patient is a 82 y.o.female with bladder cancer.  The patient needs a Port-A-Cath for durable venous access, chemotherapy, lab draws, and CT scans. We are asked to place this. Risks and benefits were discussed and informed consent was obtained.  Description of procedure: The patient was brought to the vascular and interventional radiology suite.  Moderate conscious sedation was administered throughout the procedure during a face to face encounter with the patient with my supervision of the RN administering medicines and monitoring the patient's vital signs, pulse oximetry, telemetry and mental status throughout from the start of the procedure until the patient was taken to the recovery room.  Initially, the right neck and chest were sterilely prepped and draped in a sterile surgical field was created.  Ultrasound was used to visualize a small right internal jugular vein.  The jugular vein was then accessed under ultrasound guidance on the right first with a Seldinger needle and then with a micropuncture needle but a wire would not pass.  It appeared as if the jugular vein was occluded, and a jugular needle venogram showed what appeared to be an occlusion with some extravasation after injection.  I then  turned my attention to the left.  The left neck chest and shoulder were sterilely prepped and draped, and a sterile surgical field was created. Ultrasound was used to help visualize a patent left internal jugular vein. This was then accessed under direct ultrasound guidance without difficulty with the micropuncture needle and a permanent image was recorded.  Initially a wire would not pass and a second access attempt was done.  A micropuncture wire and sheath were then placed.  Since we have had a hard time passing the wire, I elected to perform a jugular venogram on the left through the micropuncture sheath.  This showed patent left jugular, innominate, and superior vena cava.  A J-wire was placed. After skin nick and dilatation, the peel-away sheath was then placed over the wire. I then anesthetized an area under the clavicle approximately 1-2 fingerbreadths. A transverse incision was created and an inferior pocket was created with electrocautery and blunt dissection. The port was then brought onto the field, placed into the pocket and secured to the chest wall with 2 Prolene sutures. The catheter was connected to the port and tunneled from the subclavicular incision to the access site. Fluoroscopic guidance was then used to cut the catheter to an appropriate length. The catheter was then placed through the peel-away sheath and the peel-away sheath was removed. The catheter tip was parked in excellent location under fluorocoscopic guidance in the SVC just above the right atrium. The pocket was then irrigated with antibiotic impregnated saline and the wound was closed with a running 3-0 Vicryl and a 4-0 Monocryl. The access incision was  closed with a single 4-0 Monocryl. The Huber needle was used to withdraw blood and flush the port with heparinized saline. Dermabond was then placed as a dressing. The patient tolerated the procedure well and was taken to the recovery room in stable condition.   Leotis Pain 02/28/2017 11:28 AM   This note was created with Dragon Medical transcription system. Any errors in dictation are purely unintentional.

## 2017-02-28 NOTE — Progress Notes (Signed)
Dr. Lucky Cowboy at bedside speaking with pt. And son re: port insertion procedure. Both verbalized understanding.

## 2017-02-28 NOTE — Patient Instructions (Signed)
Etoposide, VP-16 injection What is this medicine? ETOPOSIDE, VP-16 (e toe POE side) is a chemotherapy drug. It is used to treat testicular cancer, lung cancer, and other cancers. This medicine may be used for other purposes; ask your health care provider or pharmacist if you have questions. COMMON BRAND NAME(S): Etopophos, Toposar, VePesid What should I tell my health care provider before I take this medicine? They need to know if you have any of these conditions: -infection -kidney disease -liver disease -low blood counts, like low white cell, platelet, or red cell counts -an unusual or allergic reaction to etoposide, other medicines, foods, dyes, or preservatives -pregnant or trying to get pregnant -breast-feeding How should I use this medicine? This medicine is for infusion into a vein. It is administered in a hospital or clinic by a specially trained health care professional. Talk to your pediatrician regarding the use of this medicine in children. Special care may be needed. Overdosage: If you think you have taken too much of this medicine contact a poison control center or emergency room at once. NOTE: This medicine is only for you. Do not share this medicine with others. What if I miss a dose? It is important not to miss your dose. Call your doctor or health care professional if you are unable to keep an appointment. What may interact with this medicine? -aspirin -certain medications for seizures like carbamazepine, phenobarbital, phenytoin, valproic acid -cyclosporine -levamisole -warfarin This list may not describe all possible interactions. Give your health care provider a list of all the medicines, herbs, non-prescription drugs, or dietary supplements you use. Also tell them if you smoke, drink alcohol, or use illegal drugs. Some items may interact with your medicine. What should I watch for while using this medicine? Visit your doctor for checks on your progress. This drug  may make you feel generally unwell. This is not uncommon, as chemotherapy can affect healthy cells as well as cancer cells. Report any side effects. Continue your course of treatment even though you feel ill unless your doctor tells you to stop. In some cases, you may be given additional medicines to help with side effects. Follow all directions for their use. Call your doctor or health care professional for advice if you get a fever, chills or sore throat, or other symptoms of a cold or flu. Do not treat yourself. This drug decreases your body's ability to fight infections. Try to avoid being around people who are sick. This medicine may increase your risk to bruise or bleed. Call your doctor or health care professional if you notice any unusual bleeding. Talk to your doctor about your risk of cancer. You may be more at risk for certain types of cancers if you take this medicine. Do not become pregnant while taking this medicine or for at least 6 months after stopping it. Women should inform their doctor if they wish to become pregnant or think they might be pregnant. Women of child-bearing potential will need to have a negative pregnancy test before starting this medicine. There is a potential for serious side effects to an unborn child. Talk to your health care professional or pharmacist for more information. Do not breast-feed an infant while taking this medicine. Men must use a latex condom during sexual contact with a woman while taking this medicine and for at least 4 months after stopping it. A latex condom is needed even if you have had a vasectomy. Contact your doctor right away if your partner becomes pregnant. Do   not donate sperm while taking this medicine and for at least 4 months after you stop taking this medicine. Men should inform their doctors if they wish to father a child. This medicine may lower sperm counts. What side effects may I notice from receiving this medicine? Side effects that  you should report to your doctor or health care professional as soon as possible: -allergic reactions like skin rash, itching or hives, swelling of the face, lips, or tongue -low blood counts - this medicine may decrease the number of white blood cells, red blood cells and platelets. You may be at increased risk for infections and bleeding. -signs of infection - fever or chills, cough, sore throat, pain or difficulty passing urine -signs of decreased platelets or bleeding - bruising, pinpoint red spots on the skin, black, tarry stools, blood in the urine -signs of decreased red blood cells - unusually weak or tired, fainting spells, lightheadedness -breathing problems -changes in vision -mouth or throat sores or ulcers -pain, redness, swelling or irritation at the injection site -pain, tingling, numbness in the hands or feet -redness, blistering, peeling or loosening of the skin, including inside the mouth -seizures -vomiting Side effects that usually do not require medical attention (report to your doctor or health care professional if they continue or are bothersome): -diarrhea -hair loss -loss of appetite -nausea -stomach pain This list may not describe all possible side effects. Call your doctor for medical advice about side effects. You may report side effects to FDA at 1-800-FDA-1088. Where should I keep my medicine? This drug is given in a hospital or clinic and will not be stored at home. NOTE: This sheet is a summary. It may not cover all possible information. If you have questions about this medicine, talk to your doctor, pharmacist, or health care provider.  2018 [employer]/Gold Standard (2015-04-23 11:53:23) Carboplatin injection What is this medicine? CARBOPLATIN (KAR boe pla tin) is a chemotherapy drug. It targets fast dividing cells, like cancer cells, and causes these cells to die. This medicine is used to treat ovarian cancer and many other cancers. This medicine may be  used for other purposes; ask your health care provider or pharmacist if you have questions. COMMON BRAND NAME(S): Paraplatin What should I tell my health care provider before I take this medicine? They need to know if you have any of these conditions: -blood disorders -hearing problems -kidney disease -recent or ongoing radiation therapy -an unusual or allergic reaction to carboplatin, cisplatin, other chemotherapy, other medicines, foods, dyes, or preservatives -pregnant or trying to get pregnant -breast-feeding How should I use this medicine? This drug is usually given as an infusion into a vein. It is administered in a hospital or clinic by a specially trained health care professional. Talk to your pediatrician regarding the use of this medicine in children. Special care may be needed. Overdosage: If you think you have taken too much of this medicine contact a poison control center or emergency room at once. NOTE: This medicine is only for you. Do not share this medicine with others. What if I miss a dose? It is important not to miss a dose. Call your doctor or health care professional if you are unable to keep an appointment. What may interact with this medicine? -medicines for seizures -medicines to increase blood counts like filgrastim, pegfilgrastim, sargramostim -some antibiotics like amikacin, gentamicin, neomycin, streptomycin, tobramycin -vaccines Talk to your doctor or health care professional before taking any of these medicines: -acetaminophen -aspirin -ibuprofen -ketoprofen -naproxen   This list may not describe all possible interactions. Give your health care provider a list of all the medicines, herbs, non-prescription drugs, or dietary supplements you use. Also tell them if you smoke, drink alcohol, or use illegal drugs. Some items may interact with your medicine. What should I watch for while using this medicine? Your condition will be monitored carefully while you are  receiving this medicine. You will need important blood work done while you are taking this medicine. This drug may make you feel generally unwell. This is not uncommon, as chemotherapy can affect healthy cells as well as cancer cells. Report any side effects. Continue your course of treatment even though you feel ill unless your doctor tells you to stop. In some cases, you may be given additional medicines to help with side effects. Follow all directions for their use. Call your doctor or health care professional for advice if you get a fever, chills or sore throat, or other symptoms of a cold or flu. Do not treat yourself. This drug decreases your body's ability to fight infections. Try to avoid being around people who are sick. This medicine may increase your risk to bruise or bleed. Call your doctor or health care professional if you notice any unusual bleeding. Be careful brushing and flossing your teeth or using a toothpick because you may get an infection or bleed more easily. If you have any dental work done, tell your dentist you are receiving this medicine. Avoid taking products that contain aspirin, acetaminophen, ibuprofen, naproxen, or ketoprofen unless instructed by your doctor. These medicines may hide a fever. Do not become pregnant while taking this medicine. Women should inform their doctor if they wish to become pregnant or think they might be pregnant. There is a potential for serious side effects to an unborn child. Talk to your health care professional or pharmacist for more information. Do not breast-feed an infant while taking this medicine. What side effects may I notice from receiving this medicine? Side effects that you should report to your doctor or health care professional as soon as possible: -allergic reactions like skin rash, itching or hives, swelling of the face, lips, or tongue -signs of infection - fever or chills, cough, sore throat, pain or difficulty passing  urine -signs of decreased platelets or bleeding - bruising, pinpoint red spots on the skin, black, tarry stools, nosebleeds -signs of decreased red blood cells - unusually weak or tired, fainting spells, lightheadedness -breathing problems -changes in hearing -changes in vision -chest pain -high blood pressure -low blood counts - This drug may decrease the number of white blood cells, red blood cells and platelets. You may be at increased risk for infections and bleeding. -nausea and vomiting -pain, swelling, redness or irritation at the injection site -pain, tingling, numbness in the hands or feet -problems with balance, talking, walking -trouble passing urine or change in the amount of urine Side effects that usually do not require medical attention (report to your doctor or health care professional if they continue or are bothersome): -hair loss -loss of appetite -metallic taste in the mouth or changes in taste This list may not describe all possible side effects. Call your doctor for medical advice about side effects. You may report side effects to FDA at 1-800-FDA-1088. Where should I keep my medicine? This drug is given in a hospital or clinic and will not be stored at home. NOTE: This sheet is a summary. It may not cover all possible information. If you have   questions about this medicine, talk to your doctor, pharmacist, or health care provider.  2018 [employer]/Gold Standard (2007-08-06 14:38:05)  

## 2017-03-01 ENCOUNTER — Inpatient Hospital Stay: Payer: Medicare Other

## 2017-03-01 ENCOUNTER — Encounter: Payer: Self-pay | Admitting: Vascular Surgery

## 2017-03-02 ENCOUNTER — Encounter: Payer: Self-pay | Admitting: Oncology

## 2017-03-02 ENCOUNTER — Inpatient Hospital Stay (HOSPITAL_BASED_OUTPATIENT_CLINIC_OR_DEPARTMENT_OTHER): Payer: Medicare Other | Admitting: Oncology

## 2017-03-02 ENCOUNTER — Telehealth: Payer: Self-pay | Admitting: Oncology

## 2017-03-02 ENCOUNTER — Inpatient Hospital Stay: Payer: Medicare Other

## 2017-03-02 VITALS — BP 138/78 | Temp 99.1°F | Resp 16 | Wt 133.9 lb

## 2017-03-02 DIAGNOSIS — R5383 Other fatigue: Secondary | ICD-10-CM | POA: Diagnosis not present

## 2017-03-02 DIAGNOSIS — D751 Secondary polycythemia: Secondary | ICD-10-CM | POA: Diagnosis not present

## 2017-03-02 DIAGNOSIS — Z5111 Encounter for antineoplastic chemotherapy: Secondary | ICD-10-CM

## 2017-03-02 DIAGNOSIS — Z800 Family history of malignant neoplasm of digestive organs: Secondary | ICD-10-CM

## 2017-03-02 DIAGNOSIS — Z87891 Personal history of nicotine dependence: Secondary | ICD-10-CM

## 2017-03-02 DIAGNOSIS — E7800 Pure hypercholesterolemia, unspecified: Secondary | ICD-10-CM

## 2017-03-02 DIAGNOSIS — Z7689 Persons encountering health services in other specified circumstances: Secondary | ICD-10-CM

## 2017-03-02 DIAGNOSIS — I2510 Atherosclerotic heart disease of native coronary artery without angina pectoris: Secondary | ICD-10-CM

## 2017-03-02 DIAGNOSIS — R739 Hyperglycemia, unspecified: Secondary | ICD-10-CM

## 2017-03-02 DIAGNOSIS — C679 Malignant neoplasm of bladder, unspecified: Secondary | ICD-10-CM | POA: Diagnosis not present

## 2017-03-02 DIAGNOSIS — K219 Gastro-esophageal reflux disease without esophagitis: Secondary | ICD-10-CM

## 2017-03-02 DIAGNOSIS — J449 Chronic obstructive pulmonary disease, unspecified: Secondary | ICD-10-CM

## 2017-03-02 DIAGNOSIS — R918 Other nonspecific abnormal finding of lung field: Secondary | ICD-10-CM

## 2017-03-02 DIAGNOSIS — Z1509 Genetic susceptibility to other malignant neoplasm: Secondary | ICD-10-CM

## 2017-03-02 DIAGNOSIS — I10 Essential (primary) hypertension: Secondary | ICD-10-CM

## 2017-03-02 DIAGNOSIS — Z85828 Personal history of other malignant neoplasm of skin: Secondary | ICD-10-CM

## 2017-03-02 DIAGNOSIS — R609 Edema, unspecified: Secondary | ICD-10-CM | POA: Diagnosis not present

## 2017-03-02 DIAGNOSIS — J439 Emphysema, unspecified: Secondary | ICD-10-CM | POA: Diagnosis not present

## 2017-03-02 DIAGNOSIS — Z803 Family history of malignant neoplasm of breast: Secondary | ICD-10-CM

## 2017-03-02 DIAGNOSIS — Z7189 Other specified counseling: Secondary | ICD-10-CM | POA: Insufficient documentation

## 2017-03-02 DIAGNOSIS — Z8711 Personal history of peptic ulcer disease: Secondary | ICD-10-CM

## 2017-03-02 DIAGNOSIS — M810 Age-related osteoporosis without current pathological fracture: Secondary | ICD-10-CM

## 2017-03-02 DIAGNOSIS — K449 Diaphragmatic hernia without obstruction or gangrene: Secondary | ICD-10-CM

## 2017-03-02 DIAGNOSIS — I700 Atherosclerosis of aorta: Secondary | ICD-10-CM

## 2017-03-02 DIAGNOSIS — N281 Cyst of kidney, acquired: Secondary | ICD-10-CM

## 2017-03-02 DIAGNOSIS — Z79899 Other long term (current) drug therapy: Secondary | ICD-10-CM

## 2017-03-02 DIAGNOSIS — Z86010 Personal history of colonic polyps: Secondary | ICD-10-CM

## 2017-03-02 DIAGNOSIS — Z8041 Family history of malignant neoplasm of ovary: Secondary | ICD-10-CM

## 2017-03-02 DIAGNOSIS — D649 Anemia, unspecified: Secondary | ICD-10-CM

## 2017-03-02 DIAGNOSIS — K5730 Diverticulosis of large intestine without perforation or abscess without bleeding: Secondary | ICD-10-CM

## 2017-03-02 LAB — COMPREHENSIVE METABOLIC PANEL
ALBUMIN: 3.6 g/dL (ref 3.5–5.0)
ALK PHOS: 59 U/L (ref 38–126)
ALT: 11 U/L — ABNORMAL LOW (ref 14–54)
ANION GAP: 9 (ref 5–15)
AST: 19 U/L (ref 15–41)
BUN: 14 mg/dL (ref 6–20)
CALCIUM: 9.5 mg/dL (ref 8.9–10.3)
CO2: 26 mmol/L (ref 22–32)
Chloride: 104 mmol/L (ref 101–111)
Creatinine, Ser: 0.88 mg/dL (ref 0.44–1.00)
GFR calc Af Amer: >60 mL/min (ref >60)
GFR calc non Af Amer: >60 mL/min (ref >60)
GLUCOSE: 135 mg/dL — AB (ref 65–99)
POTASSIUM: 3.3 mmol/L — AB (ref 3.5–5.1)
SODIUM: 139 mmol/L (ref 135–145)
Total Bilirubin: 0.7 mg/dL (ref 0.3–1.2)
Total Protein: 6.6 g/dL (ref 6.5–8.1)

## 2017-03-02 LAB — CBC WITH DIFFERENTIAL/PLATELET
BASOS ABS: 0.1 10*3/uL (ref 0–0.1)
BASOS PCT: 1 %
EOS ABS: 0.1 10*3/uL (ref 0–0.7)
Eosinophils Relative: 1 %
HCT: 40.5 % (ref 35.0–47.0)
HEMOGLOBIN: 13.6 g/dL (ref 12.0–16.0)
LYMPHS ABS: 1 10*3/uL (ref 1.0–3.6)
LYMPHS PCT: 12 %
MCH: 31.2 pg (ref 26.0–34.0)
MCHC: 33.7 g/dL (ref 32.0–36.0)
MCV: 92.6 fL (ref 80.0–100.0)
MONOS PCT: 9 %
Monocytes Absolute: 0.8 10*3/uL (ref 0.2–0.9)
NEUTROS ABS: 6.8 10*3/uL — AB (ref 1.4–6.5)
Neutrophils Relative %: 77 %
Platelets: 346 10*3/uL (ref 150–440)
RBC: 4.37 MIL/uL (ref 3.80–5.20)
RDW: 14.3 % (ref 11.5–14.5)
WBC: 8.8 10*3/uL (ref 3.6–11.0)

## 2017-03-02 MED ORDER — LORAZEPAM 0.5 MG PO TABS: 0.5000 mg | ORAL_TABLET | Freq: Four times a day (QID) | ORAL | 0 refills | Status: DC | PRN

## 2017-03-02 MED ORDER — PROCHLORPERAZINE MALEATE 10 MG PO TABS: 10.0000 mg | ORAL_TABLET | Freq: Four times a day (QID) | ORAL | 1 refills | Status: DC | PRN

## 2017-03-02 MED ORDER — LIDOCAINE-PRILOCAINE 2.5-2.5 % EX CREA: TOPICAL_CREAM | CUTANEOUS | 3 refills | Status: DC

## 2017-03-02 MED ORDER — ONDANSETRON HCL 8 MG PO TABS: 8.0000 mg | ORAL_TABLET | Freq: Two times a day (BID) | ORAL | 1 refills | Status: DC | PRN

## 2017-03-02 NOTE — Progress Notes (Signed)
Patient is here today for a follow up. Patient states no new concerns today.  

## 2017-03-02 NOTE — Progress Notes (Signed)
Bladder - No Medical Intervention - Off Treatment.  Patient Characteristics:  

## 2017-03-02 NOTE — Progress Notes (Signed)
Patient on plan of care prior to [facility]. 

## 2017-03-02 NOTE — Telephone Encounter (Signed)
I reviewed patient's medication list, and it looks like Zofran, Compazine,& EMLA cream were all sent in to [facility]. I called to verify with patient, and she states that she would like these to go to her local pharmacy, [facility].  [facility], and then I called in the Zofran, Compazine, EMLA cream, as well as Lorazepam for patient to [facility].  Thanks!

## 2017-03-02 NOTE — Progress Notes (Signed)
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 She has had jak 2 testing as well as EPO levels in the past which was unremarkable. She gets a phlebotomy when her hematocrit is more than 50. She was last seen by me in June 2018. She also has a history of lung nodules and was getting low-dose lung cancer screening scans on 07/04/2014 when her insurance stopped paying.   She recently presented to the hospital in October 2018 with symptoms of nausea and diarrhea weight loss and fatigue. She was noted to have irregular wall thickening in the ascending colon suspicious for colon cancer as well as 4.2 x 3.2 cm posterior bladder wall mass that was incidentally noted. She was also seen by vascular surgery and underwent abdominal angiogram with stent placement at the SMA. She is on aspirin and Plavix for the same. Patient underwent colonoscopy for concerns of ischemic colitis which showed irregular thickening of the descending colon consistent with ischemic colitis. EGD showed a small hiatal hernia. Losing gastric ulcers were noted which was treated with argon photocoagulation.  With regards to the posterior bladder wall mass she was seen by Dr. Erlene Quan and underwent TURBT with bilateral ureteral stent placement on 02/13/2017. Pathology showed small cell carcinoma with muscularis propria invasion   Interval history- she has not yet seen her pcp and not restarted her diuretic  yet. Still has leg swelling but is active and denies other complaints  ECOG PS- 1 Pain scale- 0   Review of systems- Review of Systems  Constitutional: Negative for chills, fever, malaise/fatigue and weight loss.  HENT: Negative for congestion, ear discharge and nosebleeds.   Eyes: Negative for blurred vision.  Respiratory: Negative for cough, hemoptysis, sputum production, shortness of breath and wheezing.   Cardiovascular: Positive for leg swelling. Negative for chest pain, palpitations, orthopnea and claudication.  Gastrointestinal: Negative for abdominal pain, blood in stool, constipation, diarrhea, heartburn, melena, nausea and vomiting.  Genitourinary: Negative for dysuria, flank pain, frequency, hematuria and urgency.  Musculoskeletal: Negative for back pain, joint pain and myalgias.  Skin: Negative for rash.  Neurological: Negative for dizziness, tingling, focal weakness, seizures, weakness and headaches.  Endo/Heme/Allergies: Does not bruise/bleed easily.  Psychiatric/Behavioral: Negative for depression and suicidal ideas. The patient does not have insomnia.       Allergies  Allergen Reactions  . Evista [Raloxifene] Other (See Comments)    Night sweats Night sweats Reaction:  Night sweats   . Fluticasone-Salmeterol Other (See Comments)    Reaction:  Cough      Past Medical History:  Diagnosis Date  . Anemia   . Asthma   . BRCA positive   . CAD (coronary artery disease)   . COPD (chronic obstructive pulmonary disease) ([place])   . Emphysema lung ([place])   . GERD (gastroesophageal reflux disease)   . History of colon polyps   . Hypercholesterolemia   . Hyperglycemia   . Hypertension   . Lung nodules   . Osteoporosis   . Personal  history of tobacco use, presenting hazards to health 11/25/2014  . Polycythemia vera(238.4)   . Renal cyst   . Skin cancer      Past Surgical History:  Procedure Laterality Date  . ANGIOPLASTY     coronary (x1)  . COLONOSCOPY WITH  PROPOFOL N/A 03/02/2015   Procedure: COLONOSCOPY WITH PROPOFOL;  Surgeon: Lollie Sails, MD;  Location: [facility] ENDOSCOPY;  Service: Endoscopy;  Laterality: N/A;  . COLONOSCOPY WITH PROPOFOL N/A 02/13/2017   Procedure: COLONOSCOPY WITH PROPOFOL;  Surgeon: [name], MD;  Location: [facility] ENDOSCOPY;  Service: Endoscopy;  Laterality: N/A;  . CORONARY ANGIOPLASTY    . CORONARY ARTERY BYPASS GRAFT    . CYSTOSCOPY W/ RETROGRADES Bilateral 02/13/2017   Procedure: CYSTOSCOPY WITH RETROGRADE PYELOGRAM;  Surgeon: Hollice Espy, MD;  Location: [facility] ORS;  Service: Urology;  Laterality: Bilateral;  . CYSTOSCOPY WITH STENT PLACEMENT Bilateral 02/13/2017   Procedure: CYSTOSCOPY WITH STENT PLACEMENT;  Surgeon: Hollice Espy, MD;  Location: [facility] ORS;  Service: Urology;  Laterality: Bilateral;  . ESOPHAGOGASTRODUODENOSCOPY (EGD) WITH PROPOFOL N/A 02/13/2017   Procedure: ESOPHAGOGASTRODUODENOSCOPY (EGD) WITH PROPOFOL;  Surgeon: [name], MD;  Location[facility] ENDOSCOPY;  Service: Endoscopy;  Laterality: N/A;  . PORTA CATH INSERTION N/A 02/28/2017   Procedure: PORTA CATH INSERTION;  Surgeon: Algernon Huxley, MD;  Location: [place] CV LAB;  Service: Cardiovascular;  Laterality: N/A;  . SALPINGOOPHORECTOMY    . TONSILECTOMY/ADENOIDECTOMY WITH MYRINGOTOMY    . TRANSURETHRAL RESECTION OF BLADDER TUMOR N/A 02/13/2017   Procedure: TRANSURETHRAL RESECTION OF BLADDER TUMOR (TURBT);  Surgeon: Hollice Espy, MD;  Location: [facility] ORS;  Service: Urology;  Laterality: N/A;  . TUBAL LIGATION    . UPPER GI ENDOSCOPY  02/13/2017  . VISCERAL ARTERY INTERVENTION N/A 02/12/2017   Procedure: VISCERAL ARTERY INTERVENTION;  Surgeon: Algernon Huxley, MD;  Location: [place] CV LAB;  Service: Cardiovascular;  Laterality: N/A;    Social History   Social History  . Marital status: Single    Spouse name: N/A  . Number of children: 2  . Years of education: N/A   Occupational History  . Not on file.   Social History Main  Topics  . Smoking status: Former Smoker    Packs/day: 1.00    Years: 45.00    Types: Cigarettes    Quit date: 11/11/2003  . Smokeless tobacco: Never Used  . Alcohol use No     Comment: occasional  . Drug use: No  . Sexual activity: No   Other Topics Concern  . Not on file   Social History Narrative  . No narrative on file    Family History  Problem Relation Age of Onset  . Cirrhosis Father        died age 54  . Alcohol abuse Father   . Asthma Mother   . Congestive Heart Failure Mother   . Breast cancer Mother        2 (1/2 sisters)  . Osteoarthritis Mother   . Colon cancer Mother   . Lupus Sister   . Alcohol abuse Sister   . Ovarian cancer Sister   . Osteoporosis Sister   . Skin cancer Sister   . Breast cancer Cousin   . Breast cancer Maternal Aunt      Current Outpatient Prescriptions:  .  albuterol (PROVENTIL HFA;VENTOLIN HFA) 108 (90 Base) MCG/ACT inhaler, Inhale 2 puffs into the lungs every 6 (six) hours as needed for wheezing or shortness of breath., Disp: 1 Inhaler, Rfl: 2 .  amLODipine (NORVASC) 5 MG tablet, TAKE 1 TABLET BY MOUTH  DAILY, Disp: 90 tablet, Rfl: 1 .  aspirin EC 81 MG EC tablet, Take 1 tablet (81 mg total) by mouth daily., Disp: 30 tablet, Rfl: 1 .  calcium carbonate (TUMS - DOSED IN MG ELEMENTAL CALCIUM) 500 MG chewable tablet, Chew 1,500 mg by mouth once a week., Disp: , Rfl:  .  cholecalciferol (VITAMIN D) 1000 units tablet, Take 1,000 Units by mouth daily., Disp: , Rfl:  .  clopidogrel (PLAVIX) 75 MG tablet, Take 1 tablet (75 mg total) by mouth daily., Disp: 30 tablet, Rfl: 2 .  fluticasone (FLONASE) 50 MCG/ACT nasal spray, Place 2 sprays into both nostrils daily. (Patient taking differently: Place 2 sprays into both nostrils daily as needed (open air ways). ), Disp: 16 g, Rfl: 5 .  ipratropium-albuterol (DUONEB) 0.5-2.5 (3) MG/3ML SOLN, Take 3 mLs by nebulization every 6 (six) hours as needed (for shortness of breath)., Disp: 36 mL, Rfl: 0 .   losartan (COZAAR) 100 MG tablet, TAKE 1 TABLET BY MOUTH  DAILY, Disp: 90 tablet, Rfl: 1 .  lovastatin (MEVACOR) 40 MG tablet, TAKE 1 TABLET BY MOUTH  DAILY, Disp: 90 tablet, Rfl: 1 .  metoprolol tartrate (LOPRESSOR) 25 MG tablet, TAKE ONE-HALF TABLET BY  MOUTH TWO TIMES DAILY, Disp: 90 tablet, Rfl: 1 .  oxybutynin (DITROPAN) 5 MG tablet, Take 1 tablet (5 mg total) by mouth every 8 (eight) hours as needed for bladder spasms., Disp: 30 tablet, Rfl: 0 .  pantoprazole (PROTONIX) 40 MG tablet, Take 1 tablet (40 mg total) by mouth daily., Disp: 30 tablet, Rfl: 1 .  SYMBICORT 160-4.5 MCG/ACT inhaler, USE 2 PUFFS TWO TIMES DAILY, Disp: 30.6 g, Rfl: 1 .  oxyCODONE-acetaminophen (PERCOCET/ROXICET) 5-325 MG tablet, Take 1-2 tablets by mouth every 6 (six) hours as needed for moderate pain. (Patient not taking: Reported on 03/02/2017), Disp: 30 tablet, Rfl: 0  Physical exam:  Vitals:   03/02/17 1127  BP: 138/78  Resp: 16  Temp: 99.1 F (37.3 C)  TempSrc: Tympanic  Weight: 133 lb 14.4 oz (60.7 kg)   Physical Exam  Constitutional: She is oriented to person, place, and time.  Elderly female in no acute distress  HENT:  Head: Normocephalic and atraumatic.  Eyes: Pupils are equal, round, and reactive to light. EOM are normal.  Neck: Normal range of motion.  Cardiovascular: Normal rate, regular rhythm and normal heart sounds.   Pulmonary/Chest: Effort normal and breath sounds normal.  Abdominal: Soft. Bowel sounds are normal.  Musculoskeletal: She exhibits edema.  Neurological: She is alert and oriented to person, place, and time.  Skin: Skin is warm and dry.     CMP Latest Ref Rng & Units 03/02/2017  Glucose 65 - 99 mg/dL 135(H)  BUN 6 - 20 mg/dL 14  Creatinine 0.44 - 1.00 mg/dL 0.88  Sodium 135 - 145 mmol/L 139  Potassium 3.5 - 5.1 mmol/L 3.3(L)  Chloride 101 - 111 mmol/L 104  CO2 22 - 32 mmol/L 26  Calcium 8.9 - 10.3 mg/dL 9.5  Total Protein 6.5 - 8.1 g/dL 6.6  Total Bilirubin 0.3 - 1.2  mg/dL 0.7  Alkaline Phos 38 - 126 U/L 59  AST 15 - 41 U/L 19  ALT 14 - 54 U/L 11(L)   CBC Latest Ref Rng & Units 03/02/2017  WBC 3.6 - 11.0 K/uL 8.8  Hemoglobin 12.0 - 16.0 g/dL 13.6  Hematocrit 35.0 - 47.0 % 40.5  Platelets 150 - 440 K/uL  346    No images are attached to the encounter.  Mr Jeri Cos Wo Contrast  Result Date: 02/27/2017 CLINICAL DATA:  Small cell cancer of the urinary bladder EXAM: MRI HEAD WITHOUT AND WITH CONTRAST TECHNIQUE: Multiplanar, multiecho pulse sequences of the brain and surrounding structures were obtained without and with intravenous contrast. CONTRAST:  80m MULTIHANCE GADOBENATE DIMEGLUMINE 529 MG/ML IV SOLN COMPARISON:  PET CT 02/27/2017 Brain MRI 08/19/2013 FINDINGS: Brain: The midline structures are normal. There is no focal diffusion restriction to indicate acute infarct. The brain parenchymal signal is normal and there is no mass lesion. No intraparenchymal hematoma or chronic microhemorrhage. Brain volume is normal for age without lobar predominant atrophy. The dura is normal and there is no extra-axial collection. Vascular: Major intracranial arterial and venous sinus flow voids are preserved. Skull and upper cervical spine: The visualized skull base, calvarium, upper cervical spine and extracranial soft tissues are normal. Sinuses/Orbits: No fluid levels or advanced mucosal thickening. No mastoid or middle ear effusion. Normal orbits. IMPRESSION: Normal MRI brain for age. No intracranial or calvarial metastatic disease. Electronically Signed   By: KUlyses JarredM.D.   On: 02/27/2017 13:38   Nm Pet Image Initial (pi) Skull Base To Thigh  Result Date: 02/27/2017 CLINICAL DATA:  Initial treatment strategy for small cell carcinoma of the bladder, invasive into the muscularis propria. EXAM: NUCLEAR MEDICINE PET SKULL BASE TO THIGH TECHNIQUE: 13.0 mCi F-18 FDG was injected intravenously. Full-ring PET imaging was performed from the skull base to thigh after the  radiotracer. CT data was obtained and used for attenuation correction and anatomic localization. FASTING BLOOD GLUCOSE:  Value: 119 mg/dl COMPARISON:  There are FINDINGS: NECK: No hypermetabolic lymph nodes in the neck. CHEST: No hypermetabolic mediastinal or hilar nodes. No suspicious pulmonary nodules on the CT scan. Coronary artery calcification is evident. There is atherosclerotic calcification in the wall of the thoracic aorta. ABDOMEN/PELVIS: No abnormal hypermetabolic activity within the liver, pancreas, adrenal glands, or spleen. No hypermetabolic lymph nodes in the abdomen or pelvis. SMA stent is associated with prominent calcific plaque/thrombus in the lumen of the abdominal aorta, similar to 02/11/2017. Right double-J internal ureteral stent is identified with proximal loop formed in the renal pelvis and distal loop formed in the bladder. The patient also is a left internal ureteral stent with proximal loop formed low in the renal pelvis, near the UPJ. Distal loop of the left stent is formed in the bladder lumen. Large left renal cysts noted. Diverticular changes noted in the left colon. SKELETON: No focal hypermetabolic activity to suggest skeletal metastasis. IMPRESSION: 1. No evidence for hypermetabolic metastatic disease in the neck, chest, abdomen, or pelvis. 2. No pulmonary nodules evident on CT imaging. 3. Coronary artery and thoracoabdominal aortic atherosclerosis. Electronically Signed   By: EMisty StanleyM.D.   On: 02/27/2017 15:00   Dg Chest Portable 1 View  Result Date: 02/11/2017 CLINICAL DATA:  Epigastric abdominal pain. EXAM: PORTABLE CHEST 1 VIEW COMPARISON:  Radiographs of August 21, 2016. FINDINGS: The heart size and mediastinal contours are within normal limits. Atherosclerosis of thoracic aorta is noted. No pneumothorax or pleural effusion is noted. Right lung is clear. Mild left basilar subsegmental atelectasis is noted. The visualized skeletal structures are unremarkable.  IMPRESSION: Mild left basilar subsegmental atelectasis.  Aortic atherosclerosis. Electronically Signed   By: JMarijo Conception M.D.   On: 02/11/2017 16:34   Ct Angio Abd/pel W/ And/or W/o  Result Date: 02/11/2017 CLINICAL DATA:  Nausea and  abdominal pain after eating. Clinical concern for vascular insufficiency. EXAM: CTA ABDOMEN AND PELVIS WITHOUT AND WITH CONTRAST TECHNIQUE: Multidetector CT imaging of the abdomen and pelvis was performed using the standard protocol during bolus administration of intravenous contrast. Multiplanar reconstructed images and MIPs were obtained and reviewed to evaluate the vascular anatomy. CONTRAST:  75 cc Isovue 370 COMPARISON:  Chest, abdomen and pelvis CT dated 01/14/2009. FINDINGS: VASCULAR Aorta: Marked calcified and noncalcified plaque formation in the anterior aspect of the proximal abdominal aorta at the level of the celiac axis and superior mesenteric artery origins. Additional calcified plaque formation elsewhere in the aorta. No aortic aneurysm or dissection seen. 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 There is collateral reconstitution distal to the occlusion. SMA: Marked calcified and noncalcified plaque formation at the origin with complete or near complete occlusion of a 3 mm segment of the proximal superior mesenteric artery. Renals: Single right renal artery with plaque formation at the origin producing approximately 50% stenosis. One anterior and 1 posterior left renal artery with plaque formation at the origin of both. There is approximately 80% stenosis of the proximal portion of the anterior artery at its origin. The posterior artery is completely occluded over a 3 mm long segment proximally. IMA: Calcified and noncalcified plaque formation at the origin with complete or near complete occlusion. Inflow: Mild bilateral iliac calcified plaque formation without  significant stenosis on either side. Proximal Outflow: Normal appearing proximal common femoral, femoral and profunda femoral arteries. Veins: No venous thrombosis demonstrated. Review of the MIP images confirms the above findings. NON-VASCULAR Lower chest: Linear atelectasis or scarring at both lung bases. Hepatobiliary: No focal liver abnormality is seen. No gallstones, gallbladder wall thickening, or biliary dilatation. Pancreas: Unremarkable. No pancreatic ductal dilatation or surrounding inflammatory changes. Spleen: Normal in size without focal abnormality. Adrenals/Urinary Tract: Bilateral renal cysts. Mildly irregular and heterogeneous posterior bladder mass, measuring 4.7 x 3.0 cm on image number 168 of series 4. This measures 3.2 cm in length on sagittal image number 85 series seen. Unremarkable ureters. No urinary tract calculi or hydronephrosis. Mild bilateral adrenal hyperplasia. Stomach/Bowel: Small hiatal hernia. Mild sigmoid colon diverticulosis. Poorly distended ascending colon with some irregular wall thickening. Unremarkable small bowel. Lymphatic: No enlarged lymph nodes. Reproductive: Uterus and bilateral adnexa are unremarkable. Other: No abdominal wall hernia or abnormality. No abdominopelvic ascites. Musculoskeletal: Approximately 20% L2 superior endplate compression deformity with Schmorl's node formation. No acute fracture lines or bony retropulsion. Mild lumbar and lower thoracic spine degenerative changes. IMPRESSION: VASCULAR 1. Marked densely calcified and noncalcified plaque formation in the anterior aspect of the proximal abdominal aorta at the origins of the celiac axis and superior mesenteric artery. 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 2. Plaque formation causing complete or near complete occlusion of the inferior mesenteric artery at its  origin. 3. Plaque formation causing complete occlusion of a 3 mm long segment of the proximal portion of the posterior left renal artery and plaque formation causing approximately 80% stenosis of the proximal portion of the anterior left renal artery. 4. Plaque formation causing approximately 50% stenosis of the origin of the right renal artery. 5. Catheter angiography and stent placement as indicated at that time is recommended. NON-VASCULAR 1. **An incidental finding of potential clinical significance  has been found. 4.7 x 3.2 x 3.0 cm posterior bladder mass. This is compatible with a primary bladder malignancy.** 2. **An incidental finding of potential clinical significance has been found. Irregular wall thickening involving the poorly distended ascending colon segment, suspicious for the possibility of colon cancer. Correlation with colonoscopy is recommended.** 3. Small hiatal hernia. 4. Mild sigmoid diverticulosis. Electronically Signed   By: Claudie Revering M.D.   On: 02/11/2017 17:34     Assessment and plan- Patient is a 82 y.o. female with muscle invasive non metastatic small cell carcinoma of the bladder [id number]   I have personally reviewed PET/CT images and I have discussed findings with her. Case was also reviewed at tumor board. Plan is to proceed with neoadjuvant chemotherapy followed by either definitive surgery if she is a candidate at that time or radiation therapy  She has evidence of muscle invasive bladder cancer. No evidence of metastatic disease seen on scans. I will proceed with neoadjuvant chemotherapy with carboplatin AUC 4 along with etoposide 100 mg/meter square given IV on D1 ad D1, 2 and 3 respectively every 3 weeks for 4 cycles with neulasta support. I discussed the risks and benefits of chemotherapy including all but not limited to nausea, vomiting, fatigue, low blood counts, risk of infections and hospitalization. Patient understands and agrees to proceed as planned.treatment  will be given with a curative intent  She already had a port placed and completed her chemo teach. She will start Day 1 cycle 1 of chemotherapy on 03/06/17. I will see her on Nov 1 to assess her tolerance to cycle 1 of chemotherapy   Total face to face encounter time for this patient visit was 30 min. >50% of the time was  spent in counseling and coordination of care.      Visit Diagnosis 1. Small cell carcinoma of bladder ([place])   2. Goals of care, counseling/discussion   3. Encounter for antineoplastic chemotherapy      Dr. Randa Evens, MD, MPH [facility] Pager- [phone] 03/02/2017 2:30 PM

## 2017-03-06 ENCOUNTER — Inpatient Hospital Stay: Payer: Medicare Other

## 2017-03-06 VITALS — BP 167/62 | HR 61 | Temp 98.5°F | Resp 20

## 2017-03-06 DIAGNOSIS — D751 Secondary polycythemia: Secondary | ICD-10-CM | POA: Diagnosis not present

## 2017-03-06 DIAGNOSIS — R918 Other nonspecific abnormal finding of lung field: Secondary | ICD-10-CM | POA: Diagnosis not present

## 2017-03-06 DIAGNOSIS — C679 Malignant neoplasm of bladder, unspecified: Secondary | ICD-10-CM

## 2017-03-06 DIAGNOSIS — Z7689 Persons encountering health services in other specified circumstances: Secondary | ICD-10-CM | POA: Diagnosis not present

## 2017-03-06 DIAGNOSIS — Z5111 Encounter for antineoplastic chemotherapy: Secondary | ICD-10-CM | POA: Diagnosis not present

## 2017-03-06 DIAGNOSIS — K449 Diaphragmatic hernia without obstruction or gangrene: Secondary | ICD-10-CM | POA: Diagnosis not present

## 2017-03-06 MED ORDER — SODIUM CHLORIDE 0.9 % IV SOLN
Freq: Once | INTRAVENOUS | Status: AC
  Administered 2017-03-06: 09:00:00 via INTRAVENOUS
  Filled 2017-03-06: qty 1000

## 2017-03-06 MED ORDER — PALONOSETRON HCL INJECTION 0.25 MG/5ML
0.2500 mg | Freq: Once | INTRAVENOUS | Status: AC
  Administered 2017-03-06: 0.25 mg via INTRAVENOUS
  Filled 2017-03-06: qty 5

## 2017-03-06 MED ORDER — ETOPOSIDE CHEMO INJECTION 1 GM/50ML
100.0000 mg/m2 | Freq: Once | INTRAVENOUS | Status: AC
  Administered 2017-03-06: 170 mg via INTRAVENOUS
  Filled 2017-03-06: qty 8.5

## 2017-03-06 MED ORDER — HEPARIN SOD (PORK) LOCK FLUSH 100 UNIT/ML IV SOLN
500.0000 [IU] | Freq: Once | INTRAVENOUS | Status: AC | PRN
  Administered 2017-03-06: 500 [IU]
  Filled 2017-03-06: qty 5

## 2017-03-06 MED ORDER — DEXAMETHASONE SODIUM PHOSPHATE 10 MG/ML IJ SOLN
10.0000 mg | Freq: Once | INTRAMUSCULAR | Status: AC
  Administered 2017-03-06: 10 mg via INTRAVENOUS
  Filled 2017-03-06: qty 1

## 2017-03-06 MED ORDER — SODIUM CHLORIDE 0.9 % IV SOLN: 10.0000 mg | Freq: Once | INTRAVENOUS | Status: DC

## 2017-03-06 MED ORDER — CARBOPLATIN CHEMO INJECTION 450 MG/45ML
270.0000 mg | Freq: Once | INTRAVENOUS | Status: AC
  Administered 2017-03-06: 270 mg via INTRAVENOUS
  Filled 2017-03-06: qty 27

## 2017-03-06 MED ORDER — SODIUM CHLORIDE 0.9% FLUSH
10.0000 mL | INTRAVENOUS | Status: DC | PRN
  Administered 2017-03-06: 10 mL
  Filled 2017-03-06: qty 10

## 2017-03-07 ENCOUNTER — Inpatient Hospital Stay: Payer: Medicare Other

## 2017-03-07 VITALS — BP 170/74 | HR 66 | Temp 98.0°F | Resp 20

## 2017-03-07 DIAGNOSIS — Z5111 Encounter for antineoplastic chemotherapy: Secondary | ICD-10-CM | POA: Diagnosis not present

## 2017-03-07 DIAGNOSIS — C679 Malignant neoplasm of bladder, unspecified: Secondary | ICD-10-CM | POA: Diagnosis not present

## 2017-03-07 DIAGNOSIS — K449 Diaphragmatic hernia without obstruction or gangrene: Secondary | ICD-10-CM | POA: Diagnosis not present

## 2017-03-07 DIAGNOSIS — R918 Other nonspecific abnormal finding of lung field: Secondary | ICD-10-CM | POA: Diagnosis not present

## 2017-03-07 DIAGNOSIS — Z7689 Persons encountering health services in other specified circumstances: Secondary | ICD-10-CM | POA: Diagnosis not present

## 2017-03-07 DIAGNOSIS — D751 Secondary polycythemia: Secondary | ICD-10-CM | POA: Diagnosis not present

## 2017-03-07 MED ORDER — DEXAMETHASONE SODIUM PHOSPHATE 100 MG/10ML IJ SOLN: 10.0000 mg | Freq: Once | INTRAMUSCULAR | Status: DC

## 2017-03-07 MED ORDER — SODIUM CHLORIDE 0.9 % IV SOLN
Freq: Once | INTRAVENOUS | Status: AC
  Administered 2017-03-07: 14:00:00 via INTRAVENOUS
  Filled 2017-03-07: qty 1000

## 2017-03-07 MED ORDER — SODIUM CHLORIDE 0.9 % IV SOLN
100.0000 mg/m2 | Freq: Once | INTRAVENOUS | Status: AC
  Administered 2017-03-07: 170 mg via INTRAVENOUS
  Filled 2017-03-07: qty 8.5

## 2017-03-07 MED ORDER — HEPARIN SOD (PORK) LOCK FLUSH 100 UNIT/ML IV SOLN
500.0000 [IU] | Freq: Once | INTRAVENOUS | Status: AC | PRN
  Administered 2017-03-07: 500 [IU]
  Filled 2017-03-07: qty 5

## 2017-03-07 MED ORDER — DEXAMETHASONE SODIUM PHOSPHATE 10 MG/ML IJ SOLN
10.0000 mg | Freq: Once | INTRAMUSCULAR | Status: AC
  Administered 2017-03-07: 10 mg via INTRAVENOUS
  Filled 2017-03-07: qty 1

## 2017-03-08 ENCOUNTER — Inpatient Hospital Stay: Payer: Medicare Other

## 2017-03-08 ENCOUNTER — Telehealth: Payer: Self-pay

## 2017-03-08 VITALS — BP 165/62 | HR 72 | Temp 97.2°F | Resp 18

## 2017-03-08 DIAGNOSIS — Z7689 Persons encountering health services in other specified circumstances: Secondary | ICD-10-CM | POA: Diagnosis not present

## 2017-03-08 DIAGNOSIS — R918 Other nonspecific abnormal finding of lung field: Secondary | ICD-10-CM | POA: Diagnosis not present

## 2017-03-08 DIAGNOSIS — Z5111 Encounter for antineoplastic chemotherapy: Secondary | ICD-10-CM | POA: Diagnosis not present

## 2017-03-08 DIAGNOSIS — D751 Secondary polycythemia: Secondary | ICD-10-CM | POA: Diagnosis not present

## 2017-03-08 DIAGNOSIS — C679 Malignant neoplasm of bladder, unspecified: Secondary | ICD-10-CM

## 2017-03-08 DIAGNOSIS — K449 Diaphragmatic hernia without obstruction or gangrene: Secondary | ICD-10-CM | POA: Diagnosis not present

## 2017-03-08 MED ORDER — DEXAMETHASONE SODIUM PHOSPHATE 10 MG/ML IJ SOLN
10.0000 mg | Freq: Once | INTRAMUSCULAR | Status: AC
  Administered 2017-03-08: 10 mg via INTRAVENOUS
  Filled 2017-03-08: qty 1

## 2017-03-08 MED ORDER — SODIUM CHLORIDE 0.9 % IV SOLN: 10.0000 mg | Freq: Once | INTRAVENOUS | Status: DC

## 2017-03-08 MED ORDER — HEPARIN SOD (PORK) LOCK FLUSH 100 UNIT/ML IV SOLN: 500.0000 [IU] | Freq: Once | INTRAVENOUS | Status: DC | PRN

## 2017-03-08 MED ORDER — PEGFILGRASTIM 6 MG/0.6ML ~~LOC~~ PSKT
6.0000 mg | PREFILLED_SYRINGE | Freq: Once | SUBCUTANEOUS | Status: AC
  Administered 2017-03-08: 6 mg via SUBCUTANEOUS
  Filled 2017-03-08: qty 0.6

## 2017-03-08 MED ORDER — SODIUM CHLORIDE 0.9 % IV SOLN
Freq: Once | INTRAVENOUS | Status: AC
  Administered 2017-03-08: 14:00:00 via INTRAVENOUS
  Filled 2017-03-08: qty 1000

## 2017-03-08 MED ORDER — SODIUM CHLORIDE 0.9 % IV SOLN
100.0000 mg/m2 | Freq: Once | INTRAVENOUS | Status: AC
  Administered 2017-03-08: 170 mg via INTRAVENOUS
  Filled 2017-03-08: qty 8.5

## 2017-03-08 NOTE — Telephone Encounter (Signed)
Patient notified that surgery is scheduled on 03-26-17 with Dr. Erlene Quan will mail reminder and pre-admit apt

## 2017-03-09 DIAGNOSIS — I2510 Atherosclerotic heart disease of native coronary artery without angina pectoris: Secondary | ICD-10-CM | POA: Diagnosis not present

## 2017-03-09 DIAGNOSIS — C679 Malignant neoplasm of bladder, unspecified: Secondary | ICD-10-CM | POA: Diagnosis not present

## 2017-03-09 DIAGNOSIS — K551 Chronic vascular disorders of intestine: Secondary | ICD-10-CM | POA: Diagnosis not present

## 2017-03-09 DIAGNOSIS — I10 Essential (primary) hypertension: Secondary | ICD-10-CM | POA: Diagnosis not present

## 2017-03-09 DIAGNOSIS — Z483 Aftercare following surgery for neoplasm: Secondary | ICD-10-CM | POA: Diagnosis not present

## 2017-03-09 DIAGNOSIS — J449 Chronic obstructive pulmonary disease, unspecified: Secondary | ICD-10-CM | POA: Diagnosis not present

## 2017-03-13 DIAGNOSIS — Z466 Encounter for fitting and adjustment of urinary device: Secondary | ICD-10-CM | POA: Diagnosis not present

## 2017-03-13 DIAGNOSIS — D45 Polycythemia vera: Secondary | ICD-10-CM | POA: Diagnosis not present

## 2017-03-13 DIAGNOSIS — I2510 Atherosclerotic heart disease of native coronary artery without angina pectoris: Secondary | ICD-10-CM | POA: Diagnosis not present

## 2017-03-13 DIAGNOSIS — J449 Chronic obstructive pulmonary disease, unspecified: Secondary | ICD-10-CM | POA: Diagnosis not present

## 2017-03-13 DIAGNOSIS — Z95828 Presence of other vascular implants and grafts: Secondary | ICD-10-CM | POA: Diagnosis not present

## 2017-03-13 DIAGNOSIS — C679 Malignant neoplasm of bladder, unspecified: Secondary | ICD-10-CM | POA: Diagnosis not present

## 2017-03-13 DIAGNOSIS — Z955 Presence of coronary angioplasty implant and graft: Secondary | ICD-10-CM | POA: Diagnosis not present

## 2017-03-13 DIAGNOSIS — I10 Essential (primary) hypertension: Secondary | ICD-10-CM | POA: Diagnosis not present

## 2017-03-13 DIAGNOSIS — J45909 Unspecified asthma, uncomplicated: Secondary | ICD-10-CM | POA: Diagnosis not present

## 2017-03-13 DIAGNOSIS — Z483 Aftercare following surgery for neoplasm: Secondary | ICD-10-CM | POA: Diagnosis not present

## 2017-03-13 DIAGNOSIS — K551 Chronic vascular disorders of intestine: Secondary | ICD-10-CM | POA: Diagnosis not present

## 2017-03-13 DIAGNOSIS — Z951 Presence of aortocoronary bypass graft: Secondary | ICD-10-CM | POA: Diagnosis not present

## 2017-03-13 NOTE — Telephone Encounter (Signed)
Forms placed in red folder.

## 2017-03-13 NOTE — Telephone Encounter (Signed)
Called patient states that she is not taking Potassium. She has but was never told to start back. Put in red folder for corrections.

## 2017-03-13 NOTE — Telephone Encounter (Signed)
Form signed and placed in box.  Signed, but need to know if taking potassium.  If taking, then need to have home health check met b - dx I10 

## 2017-03-13 NOTE — Telephone Encounter (Signed)
Change made on form.  Completed and placed in box.

## 2017-03-13 NOTE — Telephone Encounter (Signed)
Left message for Rip Harbour at [place] to call me back.

## 2017-03-14 ENCOUNTER — Telehealth: Payer: Self-pay | Admitting: Internal Medicine

## 2017-03-14 ENCOUNTER — Encounter: Payer: Self-pay | Admitting: Internal Medicine

## 2017-03-14 ENCOUNTER — Telehealth: Payer: Self-pay | Admitting: *Deleted

## 2017-03-14 DIAGNOSIS — I2510 Atherosclerotic heart disease of native coronary artery without angina pectoris: Secondary | ICD-10-CM | POA: Diagnosis not present

## 2017-03-14 DIAGNOSIS — J449 Chronic obstructive pulmonary disease, unspecified: Secondary | ICD-10-CM | POA: Diagnosis not present

## 2017-03-14 DIAGNOSIS — K551 Chronic vascular disorders of intestine: Secondary | ICD-10-CM | POA: Diagnosis not present

## 2017-03-14 DIAGNOSIS — I10 Essential (primary) hypertension: Secondary | ICD-10-CM | POA: Diagnosis not present

## 2017-03-14 DIAGNOSIS — Z483 Aftercare following surgery for neoplasm: Secondary | ICD-10-CM | POA: Diagnosis not present

## 2017-03-14 DIAGNOSIS — C679 Malignant neoplasm of bladder, unspecified: Secondary | ICD-10-CM | POA: Diagnosis not present

## 2017-03-14 MED ORDER — LEVOFLOXACIN 500 MG PO TABS: 500.0000 mg | ORAL_TABLET | Freq: Every day | ORAL | 0 refills | Status: DC

## 2017-03-14 NOTE — Telephone Encounter (Signed)
Per VO Dr B, Levaquin 500 mg daily times 5 days and keep appointment tomorrow with Dr Janese Banks. Misty informed and she is with patient relaying message to her

## 2017-03-14 NOTE — Telephone Encounter (Signed)
levaquin 500 mg/day x 5 days; keep appts as planned for tomorrow with Dr.Rao

## 2017-03-14 NOTE — Telephone Encounter (Signed)
Faxed in and sent copy to charge.

## 2017-03-14 NOTE — Telephone Encounter (Signed)
Home health nurse called to report Temp 101 today, no other symptoms. Patient has an appointment tomorrow, Her last chemotherapy was on 10/25 Carbo Etopiside. Nurse asking if we want her to do anything while she is out in the home.Please advise

## 2017-03-15 ENCOUNTER — Inpatient Hospital Stay: Payer: Medicare Other | Attending: Oncology

## 2017-03-15 ENCOUNTER — Inpatient Hospital Stay: Payer: Medicare Other

## 2017-03-15 ENCOUNTER — Inpatient Hospital Stay (HOSPITAL_BASED_OUTPATIENT_CLINIC_OR_DEPARTMENT_OTHER): Payer: Medicare Other | Admitting: Oncology

## 2017-03-15 ENCOUNTER — Encounter: Payer: Self-pay | Admitting: Oncology

## 2017-03-15 VITALS — BP 147/56 | HR 87 | Temp 101.9°F | Resp 16 | Wt 133.0 lb

## 2017-03-15 DIAGNOSIS — C679 Malignant neoplasm of bladder, unspecified: Secondary | ICD-10-CM

## 2017-03-15 DIAGNOSIS — I10 Essential (primary) hypertension: Secondary | ICD-10-CM

## 2017-03-15 DIAGNOSIS — K219 Gastro-esophageal reflux disease without esophagitis: Secondary | ICD-10-CM

## 2017-03-15 DIAGNOSIS — Z86010 Personal history of colonic polyps: Secondary | ICD-10-CM | POA: Diagnosis not present

## 2017-03-15 DIAGNOSIS — D751 Secondary polycythemia: Secondary | ICD-10-CM | POA: Insufficient documentation

## 2017-03-15 DIAGNOSIS — Z87442 Personal history of urinary calculi: Secondary | ICD-10-CM

## 2017-03-15 DIAGNOSIS — R509 Fever, unspecified: Secondary | ICD-10-CM

## 2017-03-15 DIAGNOSIS — M810 Age-related osteoporosis without current pathological fracture: Secondary | ICD-10-CM

## 2017-03-15 DIAGNOSIS — J45909 Unspecified asthma, uncomplicated: Secondary | ICD-10-CM

## 2017-03-15 DIAGNOSIS — E871 Hypo-osmolality and hyponatremia: Secondary | ICD-10-CM | POA: Diagnosis not present

## 2017-03-15 DIAGNOSIS — Z1501 Genetic susceptibility to malignant neoplasm of breast: Secondary | ICD-10-CM | POA: Diagnosis not present

## 2017-03-15 DIAGNOSIS — I700 Atherosclerosis of aorta: Secondary | ICD-10-CM | POA: Diagnosis not present

## 2017-03-15 DIAGNOSIS — Z87891 Personal history of nicotine dependence: Secondary | ICD-10-CM | POA: Insufficient documentation

## 2017-03-15 DIAGNOSIS — I2510 Atherosclerotic heart disease of native coronary artery without angina pectoris: Secondary | ICD-10-CM

## 2017-03-15 DIAGNOSIS — Z79899 Other long term (current) drug therapy: Secondary | ICD-10-CM

## 2017-03-15 DIAGNOSIS — Z85828 Personal history of other malignant neoplasm of skin: Secondary | ICD-10-CM

## 2017-03-15 DIAGNOSIS — J449 Chronic obstructive pulmonary disease, unspecified: Secondary | ICD-10-CM | POA: Diagnosis not present

## 2017-03-15 DIAGNOSIS — N179 Acute kidney failure, unspecified: Secondary | ICD-10-CM

## 2017-03-15 DIAGNOSIS — Z5111 Encounter for antineoplastic chemotherapy: Secondary | ICD-10-CM | POA: Insufficient documentation

## 2017-03-15 DIAGNOSIS — E7800 Pure hypercholesterolemia, unspecified: Secondary | ICD-10-CM

## 2017-03-15 DIAGNOSIS — T451X5A Adverse effect of antineoplastic and immunosuppressive drugs, initial encounter: Secondary | ICD-10-CM

## 2017-03-15 DIAGNOSIS — E878 Other disorders of electrolyte and fluid balance, not elsewhere classified: Secondary | ICD-10-CM | POA: Insufficient documentation

## 2017-03-15 DIAGNOSIS — D6959 Other secondary thrombocytopenia: Secondary | ICD-10-CM

## 2017-03-15 DIAGNOSIS — Z7689 Persons encountering health services in other specified circumstances: Secondary | ICD-10-CM | POA: Diagnosis not present

## 2017-03-15 DIAGNOSIS — R300 Dysuria: Secondary | ICD-10-CM

## 2017-03-15 DIAGNOSIS — Z8711 Personal history of peptic ulcer disease: Secondary | ICD-10-CM | POA: Diagnosis not present

## 2017-03-15 LAB — CBC WITH DIFFERENTIAL/PLATELET
BASOS ABS: 0 10*3/uL (ref 0–0.1)
Basophils Relative: 1 %
EOS ABS: 0 10*3/uL (ref 0–0.7)
EOS PCT: 0 %
HCT: 35.3 % (ref 35.0–47.0)
HEMOGLOBIN: 12 g/dL (ref 12.0–16.0)
LYMPHS PCT: 10 %
Lymphs Abs: 0.4 10*3/uL — ABNORMAL LOW (ref 1.0–3.6)
MCH: 31.1 pg (ref 26.0–34.0)
MCHC: 34 g/dL (ref 32.0–36.0)
MCV: 91.4 fL (ref 80.0–100.0)
Monocytes Absolute: 0.6 10*3/uL (ref 0.2–0.9)
Monocytes Relative: 16 %
NEUTROS PCT: 73 %
Neutro Abs: 2.6 10*3/uL (ref 1.4–6.5)
PLATELETS: 102 10*3/uL — AB (ref 150–440)
RBC: 3.87 MIL/uL (ref 3.80–5.20)
RDW: 13.8 % (ref 11.5–14.5)
WBC: 3.6 10*3/uL (ref 3.6–11.0)

## 2017-03-15 LAB — COMPREHENSIVE METABOLIC PANEL
ALK PHOS: 60 U/L (ref 38–126)
ALT: 15 U/L (ref 14–54)
AST: 21 U/L (ref 15–41)
Albumin: 3.6 g/dL (ref 3.5–5.0)
Anion gap: 11 (ref 5–15)
BUN: 23 mg/dL — ABNORMAL HIGH (ref 6–20)
CALCIUM: 9.9 mg/dL (ref 8.9–10.3)
CHLORIDE: 97 mmol/L — AB (ref 101–111)
CO2: 24 mmol/L (ref 22–32)
Creatinine, Ser: 1.44 mg/dL — ABNORMAL HIGH (ref 0.44–1.00)
GFR calc Af Amer: 39 mL/min — ABNORMAL LOW (ref >60)
GFR calc non Af Amer: 33 mL/min — ABNORMAL LOW (ref >60)
GLUCOSE: 205 mg/dL — AB (ref 65–99)
Potassium: 3.7 mmol/L (ref 3.5–5.1)
SODIUM: 132 mmol/L — AB (ref 135–145)
Total Bilirubin: 0.8 mg/dL (ref 0.3–1.2)
Total Protein: 7 g/dL (ref 6.5–8.1)

## 2017-03-15 MED ORDER — HEPARIN SOD (PORK) LOCK FLUSH 100 UNIT/ML IV SOLN
500.0000 [IU] | Freq: Once | INTRAVENOUS | Status: AC
  Administered 2017-03-15: 500 [IU] via INTRAVENOUS
  Filled 2017-03-15: qty 5

## 2017-03-15 MED ORDER — SODIUM CHLORIDE 0.9 % IV SOLN
Freq: Once | INTRAVENOUS | Status: AC
  Administered 2017-03-15: 16:00:00 via INTRAVENOUS
  Filled 2017-03-15: qty 1000

## 2017-03-15 MED ORDER — OXYBUTYNIN CHLORIDE 5 MG PO TABS: 5.0000 mg | ORAL_TABLET | Freq: Three times a day (TID) | ORAL | 0 refills | Status: DC | PRN

## 2017-03-15 NOTE — Progress Notes (Signed)
Patient here for follow up with labs today. She states that the home health nurse came out yesterday and checked her vitals, and she had a fever of 101. The nurse called over to the [facility], and the on call MD prescribed Levaquin for her. She has taken 2 doses of the Levaquin. Her temp is 101.9 today. She states that she is feeling ok, and denies having any chills or other symptoms. She states that she still has bladder spasms every time she urinates, and is out of the Ditropan.

## 2017-03-16 LAB — URINALYSIS, COMPLETE (UACMP) WITH MICROSCOPIC
Bilirubin Urine: NEGATIVE
GLUCOSE, UA: 50 mg/dL — AB
KETONES UR: NEGATIVE mg/dL
Nitrite: NEGATIVE
PH: 6 (ref 5.0–8.0)
Protein, ur: 100 mg/dL — AB
SPECIFIC GRAVITY, URINE: 1.018 (ref 1.005–1.030)
Squamous Epithelial / LPF: NONE SEEN

## 2017-03-17 LAB — URINE CULTURE: Culture: <10000 — AB

## 2017-03-19 ENCOUNTER — Telehealth: Payer: Self-pay | Admitting: *Deleted

## 2017-03-19 NOTE — Progress Notes (Signed)
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 Assess tolerance to cycle 1 of chemotherapy  2. 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 She has had jak 2 testing as well as EPO levels in the past which was unremarkable. She gets a phlebotomy when her hematocrit is more than 50. She was last seen by me in June 2018. She also has a history of lung nodules and was getting low-dose lung cancer screening scans on 07/04/2014 when her insurance stopped paying.   She recently presented to the hospital in October 2018 with symptoms of nausea and diarrhea weight loss and fatigue. She was noted to have irregular wall thickening in the ascending colon suspicious for colon cancer as well as 4.2 x 3.2 cm posterior bladder wall mass that was incidentally noted. She was also seen by vascular surgery and underwent abdominal angiogram with stent placement at the [facility]. She is on aspirin and Plavix for the same. Patient underwent colonoscopy for concerns of ischemic colitis which showed irregular thickening of the descending colon consistent with ischemic colitis. EGD showed a small hiatal hernia. Losing gastric ulcers were noted which was treated with argon photocoagulation.  With regards to the posterior bladder wall mass she was seen by Dr. Erlene Quan and underwent TURBT with bilateral ureteral stent placement on 02/13/2017. Pathology showed small cell carcinoma with muscularis propria invasion    Interval history- patient noted to  have fever of 101 incidentally when checked by home health nurse. Patient reports mild fatigue but denies any burning in her urination. No cough, cold or SOB. No abdominal pain. She was started on oral levaquin yesterday. She had considerable bruising with onpro neulasta and wants to switch to neulasta shot instead  ECOG PS- 1 Pain scale- 0 Opioid associated constipation- 0  Review of systems- Review of Systems  Constitutional: Positive for fever and malaise/fatigue. Negative for chills and weight loss.  HENT: Negative for congestion, ear discharge and nosebleeds.   Eyes: Negative for blurred vision.  Respiratory: Negative for cough, hemoptysis, sputum production, shortness of breath and wheezing.   Cardiovascular: Negative for chest pain, palpitations, orthopnea and claudication.  Gastrointestinal: Negative for abdominal pain, blood in stool, constipation, diarrhea, heartburn, melena, nausea and vomiting.  Genitourinary: Negative for dysuria, flank pain, frequency, hematuria and urgency.  Musculoskeletal: Negative for back pain, joint pain and myalgias.  Skin: Negative for rash.  Neurological: Negative for dizziness, tingling, focal weakness, seizures, weakness and headaches.  Endo/Heme/Allergies: Does not bruise/bleed easily.  Psychiatric/Behavioral: Negative for depression and suicidal ideas. The patient does not have insomnia.       Allergies  Allergen Reactions  . Evista [Raloxifene] Other (See Comments)    Night sweats   . Fluticasone-Salmeterol Other (See Comments)    Cough, "chokes me"      Past Medical History:  Diagnosis Date  . Anemia   . Asthma   . BRCA positive   . CAD (coronary artery disease)   . COPD (chronic obstructive pulmonary disease) ([place])   . Emphysema lung ([place])   .  GERD (gastroesophageal reflux disease)   . History of colon polyps   . Hypercholesterolemia   . Hyperglycemia   . Hypertension   . Lung nodules   . Osteoporosis   . Personal history of  tobacco use, presenting hazards to health 11/25/2014  . Polycythemia vera(238.4)   . Renal cyst   . Skin cancer      Past Surgical History:  Procedure Laterality Date  . ANGIOPLASTY     coronary (x1)  . CORONARY ANGIOPLASTY    . CORONARY ARTERY BYPASS GRAFT    . SALPINGOOPHORECTOMY    . TONSILECTOMY/ADENOIDECTOMY WITH MYRINGOTOMY    . TUBAL LIGATION    . UPPER GI ENDOSCOPY  02/13/2017    Social History   Socioeconomic History  . Marital status: Single    Spouse name: Not on file  . Number of children: 2  . Years of education: Not on file  . Highest education level: Not on file  Social Needs  . Financial resource strain: Not on file  . Food insecurity - worry: Not on file  . Food insecurity - inability: Not on file  . Transportation needs - medical: Not on file  . Transportation needs - non-medical: Not on file  Occupational History  . Not on file  Tobacco Use  . Smoking status: Former Smoker    Packs/day: 1.00    Years: 45.00    Pack years: 45.00    Types: Cigarettes    Last attempt to quit: 11/11/2003    Years since quitting: 13.3  . Smokeless tobacco: Never Used  Substance and Sexual Activity  . Alcohol use: No    Alcohol/week: 0.0 oz    Comment: occasional  . Drug use: No  . Sexual activity: No  Other Topics Concern  . Not on file  Social History Narrative  . Not on file    Family History  Problem Relation Age of Onset  . Cirrhosis Father        died age 50  . Alcohol abuse Father   . Asthma Mother   . Congestive Heart Failure Mother   . Breast cancer Mother        2 (1/2 sisters)  . Osteoarthritis Mother   . Colon cancer Mother   . Lupus Sister   . Alcohol abuse Sister   . Ovarian cancer Sister   . Osteoporosis Sister   . Skin cancer Sister   . Breast cancer Cousin   . Breast cancer Maternal Aunt      Current Outpatient Medications:  .  albuterol (PROVENTIL HFA;VENTOLIN HFA) 108 (90 Base) MCG/ACT inhaler, Inhale 2 puffs into the lungs  every 6 (six) hours as needed for wheezing or shortness of breath., Disp: 1 Inhaler, Rfl: 2 .  amLODipine (NORVASC) 5 MG tablet, TAKE 1 TABLET BY MOUTH  DAILY, Disp: 90 tablet, Rfl: 1 .  aspirin EC 81 MG EC tablet, Take 1 tablet (81 mg total) by mouth daily., Disp: 30 tablet, Rfl: 1 .  CALCIUM CARBONATE PO, Take 1 tablet by mouth every Monday, Wednesday, and Friday., Disp: , Rfl:  .  cholecalciferol (VITAMIN D) 1000 units tablet, Take 1,000 Units by mouth daily., Disp: , Rfl:  .  clopidogrel (PLAVIX) 75 MG tablet, Take 1 tablet (75 mg total) by mouth daily., Disp: 30 tablet, Rfl: 2 .  fluticasone (FLONASE) 50 MCG/ACT nasal spray, Place 2 sprays into both nostrils daily. (Patient taking differently: Place 2 sprays into both nostrils daily as needed for allergies (  congestion). ), Disp: 16 g, Rfl: 5 .  ipratropium-albuterol (DUONEB) 0.5-2.5 (3) MG/3ML SOLN, Take 3 mLs by nebulization every 6 (six) hours as needed (for shortness of breath)., Disp: 36 mL, Rfl: 0 .  levofloxacin (LEVAQUIN) 500 MG tablet, Take 1 tablet (500 mg total) by mouth daily., Disp: 5 tablet, Rfl: 0 .  lidocaine-prilocaine (EMLA) cream, Apply to affected area once (Patient taking differently: Apply 1 application topically daily as needed. Prior to port access), Disp: 30 g, Rfl: 3 .  LORazepam (ATIVAN) 0.5 MG tablet, Take 1 tablet (0.5 mg total) by mouth every 6 (six) hours as needed (Nausea or vomiting)., Disp: 30 tablet, Rfl: 0 .  losartan (COZAAR) 100 MG tablet, TAKE 1 TABLET BY MOUTH  DAILY (Patient taking differently: TAKE 157m BY MOUTH  DAILY), Disp: 90 tablet, Rfl: 1 .  lovastatin (MEVACOR) 40 MG tablet, TAKE 1 TABLET BY MOUTH  DAILY (Patient taking differently: TAKE 454mBY MOUTH  DAILY), Disp: 90 tablet, Rfl: 1 .  metoprolol tartrate (LOPRESSOR) 25 MG tablet, TAKE ONE-HALF TABLET BY  MOUTH TWO TIMES DAILY (Patient taking differently: TAKE 12.64m54mY MOUTH TWO TIMES DAILY), Disp: 90 tablet, Rfl: 1 .  ondansetron (ZOFRAN) 8 MG  tablet, Take 1 tablet (8 mg total) by mouth 2 (two) times daily as needed for refractory nausea / vomiting. Start on day 3 after carboplatin chemo., Disp: 30 tablet, Rfl: 1 .  oxyCODONE-acetaminophen (PERCOCET/ROXICET) 5-325 MG tablet, Take 1-2 tablets by mouth every 6 (six) hours as needed for moderate pain., Disp: 30 tablet, Rfl: 0 .  prochlorperazine (COMPAZINE) 10 MG tablet, Take 1 tablet (10 mg total) by mouth every 6 (six) hours as needed (Nausea or vomiting)., Disp: 30 tablet, Rfl: 1 .  SYMBICORT 160-4.5 MCG/ACT inhaler, USE 2 PUFFS TWO TIMES DAILY, Disp: 30.6 g, Rfl: 1 .  oxybutynin (DITROPAN) 5 MG tablet, Take 1 tablet (5 mg total) by mouth every 8 (eight) hours as needed for bladder spasms., Disp: 30 tablet, Rfl: 0 .  pantoprazole (PROTONIX) 40 MG tablet, Take 1 tablet (40 mg total) by mouth daily. (Patient not taking: Reported on 03/15/2017), Disp: 30 tablet, Rfl: 1  Physical exam:  Vitals:   03/15/17 1500  BP: (!) 147/56  Pulse: 87  Resp: 16  Temp: (!) 101.9 F (38.8 C)  TempSrc: Tympanic  Weight: 133 lb (60.3 kg)   Physical Exam  Constitutional: She is oriented to person, place, and time and well-developed, well-nourished, and in no distress.  HENT:  Head: Normocephalic and atraumatic.  Mouth/Throat: Oropharynx is clear and moist.  Eyes: EOM are normal. Pupils are equal, round, and reactive to light.  Neck: Normal range of motion.  Cardiovascular: Normal rate, regular rhythm and normal heart sounds.  Pulmonary/Chest: Effort normal and breath sounds normal.  Abdominal: Soft. Bowel sounds are normal.  Neurological: She is alert and oriented to person, place, and time.  Skin: Skin is warm and dry.     CMP Latest Ref Rng & Units 03/15/2017  Glucose 65 - 99 mg/dL 205(H)  BUN 6 - 20 mg/dL 23(H)  Creatinine 0.44 - 1.00 mg/dL 1.44(H)  Sodium 135 - 145 mmol/L 132(L)  Potassium 3.5 - 5.1 mmol/L 3.7  Chloride 101 - 111 mmol/L 97(L)  CO2 22 - 32 mmol/L 24  Calcium 8.9 - 10.3  mg/dL 9.9  Total Protein 6.5 - 8.1 g/dL 7.0  Total Bilirubin 0.3 - 1.2 mg/dL 0.8  Alkaline Phos 38 - 126 U/L 60  AST 15 - 41 U/L  21  ALT 14 - 54 U/L 15   CBC Latest Ref Rng & Units 03/15/2017  WBC 3.6 - 11.0 K/uL 3.6  Hemoglobin 12.0 - 16.0 g/dL 12.0  Hematocrit 35.0 - 47.0 % 35.3  Platelets 150 - 440 K/uL 102(L)    No images are attached to the encounter.  Mr Jeri Cos Wo Contrast  Result Date: 02/27/2017 CLINICAL DATA:  Small cell cancer of the urinary bladder EXAM: MRI HEAD WITHOUT AND WITH CONTRAST TECHNIQUE: Multiplanar, multiecho pulse sequences of the brain and surrounding structures were obtained without and with intravenous contrast. CONTRAST:  82m MULTIHANCE GADOBENATE DIMEGLUMINE 529 MG/ML IV SOLN COMPARISON:  PET CT 02/27/2017 Brain MRI 08/19/2013 FINDINGS: Brain: The midline structures are normal. There is no focal diffusion restriction to indicate acute infarct. The brain parenchymal signal is normal and there is no mass lesion. No intraparenchymal hematoma or chronic microhemorrhage. Brain volume is normal for age without lobar predominant atrophy. The dura is normal and there is no extra-axial collection. Vascular: Major intracranial arterial and venous sinus flow voids are preserved. Skull and upper cervical spine: The visualized skull base, calvarium, upper cervical spine and extracranial soft tissues are normal. Sinuses/Orbits: No fluid levels or advanced mucosal thickening. No mastoid or middle ear effusion. Normal orbits. IMPRESSION: Normal MRI brain for age. No intracranial or calvarial metastatic disease. Electronically Signed   By: KUlyses JarredM.D.   On: 02/27/2017 13:38   Nm Pet Image Initial (pi) Skull Base To Thigh  Result Date: 02/27/2017 CLINICAL DATA:  Initial treatment strategy for small cell carcinoma of the bladder, invasive into the muscularis propria. EXAM: NUCLEAR MEDICINE PET SKULL BASE TO THIGH TECHNIQUE: 13.0 mCi F-18 FDG was injected intravenously.  Full-ring PET imaging was performed from the skull base to thigh after the radiotracer. CT data was obtained and used for attenuation correction and anatomic localization. FASTING BLOOD GLUCOSE:  Value: 119 mg/dl COMPARISON:  There are FINDINGS: NECK: No hypermetabolic lymph nodes in the neck. CHEST: No hypermetabolic mediastinal or hilar nodes. No suspicious pulmonary nodules on the CT scan. Coronary artery calcification is evident. There is atherosclerotic calcification in the wall of the thoracic aorta. ABDOMEN/PELVIS: No abnormal hypermetabolic activity within the liver, pancreas, adrenal glands, or spleen. No hypermetabolic lymph nodes in the abdomen or pelvis. SMA stent is associated with prominent calcific plaque/thrombus in the lumen of the abdominal aorta, similar to 02/11/2017. Right double-J internal ureteral stent is identified with proximal loop formed in the renal pelvis and distal loop formed in the bladder. The patient also is a left internal ureteral stent with proximal loop formed low in the renal pelvis, near the UPJ. Distal loop of the left stent is formed in the bladder lumen. Large left renal cysts noted. Diverticular changes noted in the left colon. SKELETON: No focal hypermetabolic activity to suggest skeletal metastasis. IMPRESSION: 1. No evidence for hypermetabolic metastatic disease in the neck, chest, abdomen, or pelvis. 2. No pulmonary nodules evident on CT imaging. 3. Coronary artery and thoracoabdominal aortic atherosclerosis. Electronically Signed   By: EMisty StanleyM.D.   On: 02/27/2017 15:00     Assessment and plan- Patient is a 82y.o. 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 Mild thrombocytopenia due to chemotherapy which I will continue to monitor. I will see her back in 2 weeks for cycle 2 of carbo/etoposide with neulasta  support  Fever- likely viral. Etiology unclear. No obvious source on clinical exam. Will get CXR and UA with urine culture. She will complete her 5 days of levaquin as prescribed. She will call us if she continues to have fever over next 48 hours or any other concerning signs of symptoms  She has hyponatremia and hypochloremia and also has some evidence of AKI. Will therefore proceed with 1L normal saline today   Visit Diagnosis 1. Small cell carcinoma of bladder ([place])   2. Chemotherapy-induced thrombocytopenia   3. Fever, unspecified fever cause      Dr. Randa Evens, MD, MPH [facility] Pager- [phone] 03/19/2017 10:36 AM

## 2017-03-19 NOTE — Telephone Encounter (Signed)
Called the patient and she has not had a fever all weekend long, she feels better today.  I told her that if she did have a fever then we were going to repeat the urine culture.  The one she did already showed 10, 000 of unknown bacteria but for it to be a true uti it usually would grow 100,000.  I did advise her to call back if she has temp. 100.5 or better. She is agreeable to this plan

## 2017-03-19 NOTE — Telephone Encounter (Signed)
-----   Message from Sindy Guadeloupe, MD sent at 03/19/2017  7:39 AM EST ----- Please call and find out if she is still febrile. Need to repeat UA and urine culture if she is

## 2017-03-20 ENCOUNTER — Encounter
Admission: RE | Admit: 2017-03-20 | Discharge: 2017-03-20 | Disposition: A | Discharge status: Home / Self Care | Payer: Medicare Other | Source: Ambulatory Visit | Attending: Urology | Admitting: Urology

## 2017-03-20 ENCOUNTER — Telehealth: Payer: Self-pay | Admitting: Urology

## 2017-03-20 ENCOUNTER — Other Ambulatory Visit: Payer: Self-pay

## 2017-03-20 DIAGNOSIS — I10 Essential (primary) hypertension: Secondary | ICD-10-CM | POA: Insufficient documentation

## 2017-03-20 DIAGNOSIS — C679 Malignant neoplasm of bladder, unspecified: Secondary | ICD-10-CM | POA: Diagnosis not present

## 2017-03-20 DIAGNOSIS — Z7901 Long term (current) use of anticoagulants: Secondary | ICD-10-CM | POA: Diagnosis not present

## 2017-03-20 DIAGNOSIS — Z01812 Encounter for preprocedural laboratory examination: Secondary | ICD-10-CM | POA: Diagnosis not present

## 2017-03-20 DIAGNOSIS — Z889 Allergy status to unspecified drugs, medicaments and biological substances status: Secondary | ICD-10-CM | POA: Diagnosis not present

## 2017-03-20 DIAGNOSIS — Z01818 Encounter for other preprocedural examination: Secondary | ICD-10-CM | POA: Insufficient documentation

## 2017-03-20 LAB — URINALYSIS, ROUTINE W REFLEX MICROSCOPIC
BILIRUBIN URINE: NEGATIVE
GLUCOSE, UA: NEGATIVE mg/dL
KETONES UR: NEGATIVE mg/dL
Nitrite: NEGATIVE
PH: 6 (ref 5.0–8.0)
PROTEIN: 100 mg/dL — AB
Specific Gravity, Urine: 1.01 (ref 1.005–1.030)

## 2017-03-20 NOTE — Pre-Procedure Instructions (Signed)
Dr Kayleen Memos notified of PLT of 102 on Mar 15, 2017. Wants plts level drawn day of surgery

## 2017-03-20 NOTE — Pre-Procedure Instructions (Signed)
Abnormal urinalysis result faxed to Dr Cherrie Gauze office

## 2017-03-20 NOTE — Telephone Encounter (Signed)
Pt needs for you to call her tomorrow concerning her surgery [phone], Pt has appts in the a.m.  She needs to know if she should stop Plavix and aspirin prior to her surgery.

## 2017-03-20 NOTE — Patient Instructions (Signed)
Your procedure is scheduled on: March 26, 2017 Report to Same Day Surgery on the 2nd floor in the [place]. To find out your arrival time, please call [phone] between 1PM - 3PM on: Friday March 23, 2017  REMEMBER: Instructions that are not followed completely may result in serious medical risk up to and including death; or upon the discretion of your surgeon and anesthesiologist your surgery may need to be rescheduled.  Do not eat food or drink liquids after midnight. No gum chewing or hard candies.  You may however, drink CLEAR liquids up to 2 hours before you are scheduled to arrive at the hospital for your procedure.  Do not drink clear liquids within 2 hours of your scheduled arrival to the hospital as this may lead to your procedure being delayed or rescheduled.  Clear liquids include: - water  - apple juice without pulp - clear gatorade - black coffee or tea (NO milk, creamers, sugars) DO NOT drink anything not on this list.   No Alcohol for 24 hours before or after surgery.  No Smoking for 24 hours prior to surgery.  Notify your doctor if there is any change in your medical condition (cold, fever, infection).  Do not wear jewelry, make-up, hairpins, clips or nail polish.  Do not wear lotions, powders, or perfumes.   Do not shave 48 hours prior to surgery. Men may shave face and neck.  Contacts and dentures may not be worn into surgery.  Do not bring valuables to the hospital. [facility] is not responsible for any belongings or valuables.   TAKE THESE MEDICATIONS THE MORNING OF SURGERY WITH A SIP OF WATER: METOPROLOL AMLODIPINE   Use inhalers on the day of surgery   Follow recommendations from Cardiologist, Pulmonologist or PCP regarding stopping Aspirin AND Plavix. Stop Anti-inflammatories such as Advil, Aleve, Ibuprofen, Motrin, Naproxen, Naprosyn, Goodie powder, or aspirin products. (May take Tylenol or Acetaminophen and Celebrex if  needed.)  Stop supplements until after surgery CALCIUM. (May continue Vitamin D, Vitamin B, and multivitamin.)  If you are being discharged the day of surgery, you will not be allowed to drive home. You will need someone to drive you home and stay with you that night.   If you are taking public transportation, you will need to have a responsible adult to with you.  Please call the number above if you have any questions about these instructions.

## 2017-03-21 ENCOUNTER — Other Ambulatory Visit (INDEPENDENT_AMBULATORY_CARE_PROVIDER_SITE_OTHER): Payer: Self-pay | Admitting: Vascular Surgery

## 2017-03-21 ENCOUNTER — Ambulatory Visit (INDEPENDENT_AMBULATORY_CARE_PROVIDER_SITE_OTHER): Payer: Medicare Other | Admitting: Vascular Surgery

## 2017-03-21 ENCOUNTER — Ambulatory Visit (INDEPENDENT_AMBULATORY_CARE_PROVIDER_SITE_OTHER): Payer: Medicare Other

## 2017-03-21 ENCOUNTER — Encounter (INDEPENDENT_AMBULATORY_CARE_PROVIDER_SITE_OTHER): Payer: Self-pay | Admitting: Vascular Surgery

## 2017-03-21 VITALS — BP 150/74 | HR 82 | Resp 16 | Wt 122.0 lb

## 2017-03-21 DIAGNOSIS — Z959 Presence of cardiac and vascular implant and graft, unspecified: Secondary | ICD-10-CM

## 2017-03-21 DIAGNOSIS — K55059 Acute (reversible) ischemia of intestine, part and extent unspecified: Secondary | ICD-10-CM

## 2017-03-21 DIAGNOSIS — K551 Chronic vascular disorders of intestine: Secondary | ICD-10-CM

## 2017-03-21 DIAGNOSIS — I2510 Atherosclerotic heart disease of native coronary artery without angina pectoris: Secondary | ICD-10-CM

## 2017-03-21 DIAGNOSIS — E7800 Pure hypercholesterolemia, unspecified: Secondary | ICD-10-CM | POA: Diagnosis not present

## 2017-03-21 DIAGNOSIS — R1084 Generalized abdominal pain: Secondary | ICD-10-CM

## 2017-03-21 DIAGNOSIS — K559 Vascular disorder of intestine, unspecified: Secondary | ICD-10-CM

## 2017-03-21 DIAGNOSIS — R739 Hyperglycemia, unspecified: Secondary | ICD-10-CM

## 2017-03-21 LAB — URINE CULTURE: Culture: NO GROWTH

## 2017-03-21 NOTE — Telephone Encounter (Signed)
LMOM to notify pt that per Dr Erlene Quan pt may continue ASA & Plavix prior to surgery. Requested return call to confirm.

## 2017-03-21 NOTE — Telephone Encounter (Signed)
Pt returned call to confirm receipt of message to continue ASA & Plavix prior to surgery. Questions answered. Pt voices understanding.

## 2017-03-21 NOTE — Progress Notes (Signed)
Subjective:    [name], female    DOB: [birth year], 82 y.o.   MRN: [id number] Chief Complaint  Patient presents with  . Follow-up    4wk Mesentric   Patient presents for her first post procedure follow-up. The patient is status post a mesenteric angiogram on 02/12/2017. The patient presents today without complaint. The patient denies any symptoms such as post prandial pain, weight loss, fear of eating, nausea. The patient underwent a mesenteric artery duplex exam. This was notable for a patent superior mesenteric tach artery stent with an increased proximal superior mesenteric artery velocity which may be due in part to a greater than 50% stenosis of the mid/proximal abdominal aorta. Evidence of an occluded celiac artery which is consistent with the findings on the 02/12/2017 angiogram.The patient denies any fever, nausea vomiting.   Review of Systems  Constitutional: Negative.   HENT: Negative.   Eyes: Negative.   Respiratory: Negative.   Cardiovascular: Negative.   Gastrointestinal: Negative.   Endocrine: Negative.   Genitourinary: Negative.   Musculoskeletal: Negative.   Skin: Negative.   Allergic/Immunologic: Negative.   Neurological: Negative.   Hematological: Negative.   Psychiatric/Behavioral: Negative.       Objective:   Physical Exam  Constitutional: She is oriented to person, place, and time. She appears well-developed and well-nourished. No distress.  HENT:  Head: Normocephalic and atraumatic.  Eyes: Conjunctivae are normal. Pupils are equal, round, and reactive to light.  Neck: Normal range of motion.  Cardiovascular: Normal rate, regular rhythm, normal heart sounds and intact distal pulses.  Pulses:      Radial pulses are 2+ on the right side, and 2+ on the left side.       Dorsalis pedis pulses are 2+ on the right side, and 2+ on the left side.       Posterior tibial pulses are 2+ on the right side, and 2+ on the left side.  Pulmonary/Chest:  Effort normal.  Musculoskeletal: Normal range of motion. She exhibits no edema.  Neurological: She is alert and oriented to person, place, and time.  Skin: Skin is warm and dry. She is not diaphoretic.  Psychiatric: She has a normal mood and affect. Her behavior is normal. Judgment and thought content normal.  Vitals reviewed.  BP (!) 150/74 (BP Location: Left Arm)   Pulse 82   Resp 16   Wt 122 lb (55.3 kg)   BMI 20.30 kg/m   Past Medical History:  Diagnosis Date  . Anemia   . Asthma   . BRCA positive   . CAD (coronary artery disease)   . COPD (chronic obstructive pulmonary disease) ([place])   . Emphysema lung ([place])   . GERD (gastroesophageal reflux disease)   . History of colon polyps   . Hypercholesterolemia   . Hyperglycemia   . Hypertension   . Lung nodules   . Osteoporosis   . Personal history of tobacco use, presenting hazards to health 11/25/2014  . Polycythemia vera(238.4)   . Renal cyst   . Skin cancer    Social History   Socioeconomic History  . Marital status: Single    Spouse name: Not on file  . Number of children: 2  . Years of education: Not on file  . Highest education level: Not on file  Social Needs  . Financial resource strain: Not on file  . Food insecurity - worry: Not on file  . Food insecurity - inability: Not on file  .  Transportation needs - medical: Not on file  . Transportation needs - non-medical: Not on file  Occupational History  . Not on file  Tobacco Use  . Smoking status: Former Smoker    Packs/day: 1.00    Years: 45.00    Pack years: 45.00    Types: Cigarettes    Last attempt to quit: 11/11/2003    Years since quitting: 13.3  . Smokeless tobacco: Never Used  Substance and Sexual Activity  . Alcohol use: No    Alcohol/week: 0.0 oz    Comment: occasional  . Drug use: No  . Sexual activity: No  Other Topics Concern  . Not on file  Social History Narrative  . Not on file   Past Surgical History:  Procedure Laterality Date   . ANGIOPLASTY     coronary (x1)  . CORONARY ANGIOPLASTY    . CORONARY ARTERY BYPASS GRAFT    . SALPINGOOPHORECTOMY    . TONSILECTOMY/ADENOIDECTOMY WITH MYRINGOTOMY    . TUBAL LIGATION    . UPPER GI ENDOSCOPY  02/13/2017   Family History  Problem Relation Age of Onset  . Cirrhosis Father        died age 71  . Alcohol abuse Father   . Asthma Mother   . Congestive Heart Failure Mother   . Breast cancer Mother        2 (1/2 sisters)  . Osteoarthritis Mother   . Colon cancer Mother   . Lupus Sister   . Alcohol abuse Sister   . Ovarian cancer Sister   . Osteoporosis Sister   . Skin cancer Sister   . Breast cancer Cousin   . Breast cancer Maternal Aunt    Allergies  Allergen Reactions  . Evista [Raloxifene] Other (See Comments)    Night sweats   . Fluticasone-Salmeterol Other (See Comments)    Cough, "chokes me"       Assessment & Plan:  Patient presents for her first post procedure follow-up. The patient is status post a mesenteric angiogram on 02/12/2017. The patient presents today without complaint. The patient denies any symptoms such as post prandial pain, weight loss, fear of eating, nausea. The patient underwent a mesenteric artery duplex exam. This was notable for a patent superior mesenteric tach artery stent with an increased proximal superior mesenteric artery velocity which may be due in part to a greater than 50% stenosis of the mid/proximal abdominal aorta. Evidence of an occluded celiac artery which is consistent with the findings on the 02/12/2017 angiogram.The patient denies any fever, nausea vomiting.  1. 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 Her mesenteric artery stent is patent however there  is elevated velocities The patient is asymptomatic at his time and I do not feel any intervention is needed The patient will follow up in 3 months for repeat mesenteric artery duplex exam If the patient should experience any postprandial pain, vomiting and nausea she is to call the office sooner - VAS [name] MESENTERIC DUPLEX; Future  2. Generalized abdominal pain - resolved At this time, the patient does not experience any abdominal pain or postprandial pain, nausea or vomiting If the patient should experience any of this [facility] office  3. Hypercholesterolemia - Stable Encouraged good control as its slows the progression of atherosclerotic  disease  4. Hyperglycemia - Stable Encouraged good control as its slows the progression of atherosclerotic disease  Current Outpatient Medications on File Prior to Visit  Medication Sig Dispense Refill  . albuterol (PROVENTIL HFA;VENTOLIN HFA) 108 (90 Base) MCG/ACT inhaler Inhale 2 puffs into the lungs every 6 (six) hours as needed for wheezing or shortness of breath. 1 Inhaler 2  . amLODipine (NORVASC) 5 MG tablet TAKE 1 TABLET BY MOUTH  DAILY 90 tablet 1  . aspirin EC 81 MG EC tablet Take 1 tablet (81 mg total) by mouth daily. 30 tablet 1  . CALCIUM CARBONATE PO Take 1 tablet by mouth every Monday, Wednesday, and Friday.    . cholecalciferol (VITAMIN D) 1000 units tablet Take 1,000 Units by mouth daily.    . clopidogrel (PLAVIX) 75 MG tablet Take 1 tablet (75 mg total) by mouth daily. 30 tablet 2  . fluticasone (FLONASE) 50 MCG/ACT nasal spray Place 2 sprays into both nostrils daily. (Patient taking differently: Place 2 sprays into both nostrils daily as needed for allergies (congestion). ) 16 g 5  . ipratropium-albuterol (DUONEB) 0.5-2.5 (3) MG/3ML SOLN Take 3 mLs by nebulization every 6 (six) hours as needed (for shortness of breath). 36 mL 0  . levofloxacin (LEVAQUIN) 500 MG tablet Take 1 tablet (500 mg total) by mouth daily. 5 tablet 0  .  lidocaine-prilocaine (EMLA) cream Apply to affected area once (Patient taking differently: Apply 1 application topically daily as needed. Prior to port access) 30 g 3  . LORazepam (ATIVAN) 0.5 MG tablet Take 1 tablet (0.5 mg total) by mouth every 6 (six) hours as needed (Nausea or vomiting). 30 tablet 0  . losartan (COZAAR) 100 MG tablet TAKE 1 TABLET BY MOUTH  DAILY (Patient taking differently: TAKE 139m BY MOUTH  DAILY) 90 tablet 1  . lovastatin (MEVACOR) 40 MG tablet TAKE 1 TABLET BY MOUTH  DAILY (Patient taking differently: TAKE 48mBY MOUTH  DAILY) 90 tablet 1  . metoprolol tartrate (LOPRESSOR) 25 MG tablet TAKE ONE-HALF TABLET BY  MOUTH TWO TIMES DAILY (Patient taking differently: TAKE 12.39m36mY MOUTH TWO TIMES DAILY) 90 tablet 1  . ondansetron (ZOFRAN) 8 MG tablet Take 1 tablet (8 mg total) by mouth 2 (two) times daily as needed for refractory nausea / vomiting. Start on day 3 after carboplatin chemo. 30 tablet 1  . oxybutynin (DITROPAN) 5 MG tablet Take 1 tablet (5 mg total) by mouth every 8 (eight) hours as needed for bladder spasms. 30 tablet 0  . oxyCODONE-acetaminophen (PERCOCET/ROXICET) 5-325 MG tablet Take 1-2 tablets by mouth every 6 (six) hours as needed for moderate pain. 30 tablet 0  . pantoprazole (PROTONIX) 40 MG tablet Take 1 tablet (40 mg total) by mouth daily. 30 tablet 1  . prochlorperazine (COMPAZINE) 10 MG tablet Take 1 tablet (10 mg total) by mouth every 6 (six) hours as needed (Nausea or vomiting). 30 tablet 1  . SYMBICORT 160-4.5 MCG/ACT inhaler USE 2 PUFFS TWO TIMES DAILY 30.6 g 1   No current facility-administered medications on file prior to visit.    There are no Patient Instructions on file for this visit. No Follow-up on file.  KIMBERLY A STEGMAYER, PA-C

## 2017-03-22 DIAGNOSIS — I10 Essential (primary) hypertension: Secondary | ICD-10-CM | POA: Diagnosis not present

## 2017-03-22 DIAGNOSIS — I2510 Atherosclerotic heart disease of native coronary artery without angina pectoris: Secondary | ICD-10-CM | POA: Diagnosis not present

## 2017-03-22 DIAGNOSIS — Z483 Aftercare following surgery for neoplasm: Secondary | ICD-10-CM | POA: Diagnosis not present

## 2017-03-22 DIAGNOSIS — J449 Chronic obstructive pulmonary disease, unspecified: Secondary | ICD-10-CM | POA: Diagnosis not present

## 2017-03-22 DIAGNOSIS — K551 Chronic vascular disorders of intestine: Secondary | ICD-10-CM | POA: Diagnosis not present

## 2017-03-22 DIAGNOSIS — C679 Malignant neoplasm of bladder, unspecified: Secondary | ICD-10-CM | POA: Diagnosis not present

## 2017-03-26 ENCOUNTER — Encounter: Payer: Self-pay | Admitting: Urology

## 2017-03-26 ENCOUNTER — Ambulatory Visit
Admission: RE | Admit: 2017-03-26 | Discharge: 2017-03-26 | Disposition: A | Discharge status: Home / Self Care | Payer: Medicare Other | Source: Ambulatory Visit | Attending: Urology | Admitting: Urology

## 2017-03-26 ENCOUNTER — Ambulatory Visit: Payer: Medicare Other | Admitting: Anesthesiology

## 2017-03-26 ENCOUNTER — Telehealth: Payer: Self-pay | Admitting: Oncology

## 2017-03-26 ENCOUNTER — Encounter
Admission: RE | Disposition: A | Discharge status: Home / Self Care | Payer: Self-pay | Source: Ambulatory Visit | Attending: Urology

## 2017-03-26 DIAGNOSIS — Z7902 Long term (current) use of antithrombotics/antiplatelets: Secondary | ICD-10-CM | POA: Diagnosis not present

## 2017-03-26 DIAGNOSIS — Z955 Presence of coronary angioplasty implant and graft: Secondary | ICD-10-CM | POA: Insufficient documentation

## 2017-03-26 DIAGNOSIS — D649 Anemia, unspecified: Secondary | ICD-10-CM | POA: Diagnosis not present

## 2017-03-26 DIAGNOSIS — Z85828 Personal history of other malignant neoplasm of skin: Secondary | ICD-10-CM | POA: Insufficient documentation

## 2017-03-26 DIAGNOSIS — K219 Gastro-esophageal reflux disease without esophagitis: Secondary | ICD-10-CM | POA: Insufficient documentation

## 2017-03-26 DIAGNOSIS — N138 Other obstructive and reflux uropathy: Secondary | ICD-10-CM | POA: Diagnosis not present

## 2017-03-26 DIAGNOSIS — N183 Chronic kidney disease, stage 3 (moderate): Secondary | ICD-10-CM | POA: Insufficient documentation

## 2017-03-26 DIAGNOSIS — J439 Emphysema, unspecified: Secondary | ICD-10-CM | POA: Diagnosis not present

## 2017-03-26 DIAGNOSIS — N3289 Other specified disorders of bladder: Secondary | ICD-10-CM | POA: Insufficient documentation

## 2017-03-26 DIAGNOSIS — I2510 Atherosclerotic heart disease of native coronary artery without angina pectoris: Secondary | ICD-10-CM | POA: Diagnosis not present

## 2017-03-26 DIAGNOSIS — N135 Crossing vessel and stricture of ureter without hydronephrosis: Secondary | ICD-10-CM | POA: Insufficient documentation

## 2017-03-26 DIAGNOSIS — I10 Essential (primary) hypertension: Secondary | ICD-10-CM | POA: Diagnosis not present

## 2017-03-26 DIAGNOSIS — F419 Anxiety disorder, unspecified: Secondary | ICD-10-CM | POA: Diagnosis not present

## 2017-03-26 DIAGNOSIS — E7800 Pure hypercholesterolemia, unspecified: Secondary | ICD-10-CM | POA: Insufficient documentation

## 2017-03-26 DIAGNOSIS — Z79899 Other long term (current) drug therapy: Secondary | ICD-10-CM | POA: Diagnosis not present

## 2017-03-26 DIAGNOSIS — Z7951 Long term (current) use of inhaled steroids: Secondary | ICD-10-CM | POA: Insufficient documentation

## 2017-03-26 DIAGNOSIS — Z87891 Personal history of nicotine dependence: Secondary | ICD-10-CM | POA: Insufficient documentation

## 2017-03-26 DIAGNOSIS — Z7982 Long term (current) use of aspirin: Secondary | ICD-10-CM | POA: Diagnosis not present

## 2017-03-26 DIAGNOSIS — C670 Malignant neoplasm of trigone of bladder: Secondary | ICD-10-CM | POA: Diagnosis not present

## 2017-03-26 DIAGNOSIS — C679 Malignant neoplasm of bladder, unspecified: Secondary | ICD-10-CM | POA: Diagnosis not present

## 2017-03-26 DIAGNOSIS — I739 Peripheral vascular disease, unspecified: Secondary | ICD-10-CM | POA: Insufficient documentation

## 2017-03-26 DIAGNOSIS — I129 Hypertensive chronic kidney disease with stage 1 through stage 4 chronic kidney disease, or unspecified chronic kidney disease: Secondary | ICD-10-CM | POA: Insufficient documentation

## 2017-03-26 DIAGNOSIS — C678 Malignant neoplasm of overlapping sites of bladder: Secondary | ICD-10-CM | POA: Diagnosis not present

## 2017-03-26 DIAGNOSIS — J449 Chronic obstructive pulmonary disease, unspecified: Secondary | ICD-10-CM | POA: Diagnosis not present

## 2017-03-26 DIAGNOSIS — Z951 Presence of aortocoronary bypass graft: Secondary | ICD-10-CM | POA: Diagnosis not present

## 2017-03-26 HISTORY — PX: CYSTOSCOPY WITH BIOPSY: SHX5122

## 2017-03-26 HISTORY — PX: CYSTOSCOPY WITH STENT PLACEMENT: SHX5790

## 2017-03-26 LAB — PLATELET COUNT: Platelets: 556 10*3/uL — ABNORMAL HIGH (ref 150–440)

## 2017-03-26 SURGERY — CYSTOSCOPY, WITH STENT INSERTION
Anesthesia: General | Site: Ureter | Laterality: Bilateral | Wound class: Clean Contaminated

## 2017-03-26 MED ORDER — FENTANYL CITRATE (PF) 100 MCG/2ML IJ SOLN: 25.0000 ug | INTRAMUSCULAR | Status: DC | PRN

## 2017-03-26 MED ORDER — LIDOCAINE HCL (CARDIAC) 20 MG/ML IV SOLN
INTRAVENOUS | Status: DC | PRN
  Administered 2017-03-26: 50 mg via INTRAVENOUS

## 2017-03-26 MED ORDER — FAMOTIDINE 20 MG PO TABS
ORAL_TABLET | ORAL | Status: AC
  Administered 2017-03-26: 20 mg via ORAL
  Filled 2017-03-26: qty 1

## 2017-03-26 MED ORDER — GLYCOPYRROLATE 0.2 MG/ML IJ SOLN
INTRAMUSCULAR | Status: DC | PRN
  Administered 2017-03-26: 0.2 mg via INTRAVENOUS

## 2017-03-26 MED ORDER — LACTATED RINGERS IV SOLN
INTRAVENOUS | Status: DC
  Administered 2017-03-26 (×2): via INTRAVENOUS

## 2017-03-26 MED ORDER — ONDANSETRON HCL 4 MG/2ML IJ SOLN: 4.0000 mg | Freq: Once | INTRAMUSCULAR | Status: DC | PRN

## 2017-03-26 MED ORDER — FENTANYL CITRATE (PF) 100 MCG/2ML IJ SOLN
INTRAMUSCULAR | Status: DC | PRN
  Administered 2017-03-26 (×4): 25 ug via INTRAVENOUS

## 2017-03-26 MED ORDER — FENTANYL CITRATE (PF) 100 MCG/2ML IJ SOLN
INTRAMUSCULAR | Status: AC
  Filled 2017-03-26: qty 2

## 2017-03-26 MED ORDER — CEFAZOLIN SODIUM-DEXTROSE 2-4 GM/100ML-% IV SOLN
INTRAVENOUS | Status: AC
  Filled 2017-03-26: qty 100

## 2017-03-26 MED ORDER — IOTHALAMATE MEGLUMINE 43 % IV SOLN
INTRAVENOUS | Status: DC | PRN
  Administered 2017-03-26: 15 mL via URETHRAL

## 2017-03-26 MED ORDER — PROPOFOL 10 MG/ML IV BOLUS
INTRAVENOUS | Status: AC
  Filled 2017-03-26: qty 20

## 2017-03-26 MED ORDER — FAMOTIDINE 20 MG PO TABS
20.0000 mg | ORAL_TABLET | Freq: Once | ORAL | Status: AC
  Administered 2017-03-26: 20 mg via ORAL

## 2017-03-26 MED ORDER — PROPOFOL 10 MG/ML IV BOLUS
INTRAVENOUS | Status: DC | PRN
  Administered 2017-03-26: 100 mg via INTRAVENOUS

## 2017-03-26 MED ORDER — CEFAZOLIN SODIUM-DEXTROSE 2-4 GM/100ML-% IV SOLN
2.0000 g | Freq: Once | INTRAVENOUS | Status: AC
  Administered 2017-03-26: 2 g via INTRAVENOUS

## 2017-03-26 MED ORDER — CEPHALEXIN 500 MG PO CAPS: 500.0000 mg | ORAL_CAPSULE | Freq: Three times a day (TID) | ORAL | 0 refills | Status: AC

## 2017-03-26 SURGICAL SUPPLY — 21 items
BAG DRAIN CYSTO-URO LG1000N (MISCELLANEOUS) ×4 IMPLANT
CATH URETL 5X70 OPEN END (CATHETERS) ×4 IMPLANT
CONRAY 43 FOR UROLOGY 50M (MISCELLANEOUS) ×4 IMPLANT
GLOVE BIO SURGEON STRL SZ 6.5 (GLOVE) ×3 IMPLANT
GLOVE BIO SURGEONS STRL SZ 6.5 (GLOVE) ×1
GOWN STRL REUS W/ TWL LRG LVL3 (GOWN DISPOSABLE) ×4 IMPLANT
GOWN STRL REUS W/TWL LRG LVL3 (GOWN DISPOSABLE) ×4
KIT RM TURNOVER CYSTO AR (KITS) ×4 IMPLANT
PACK CYSTO AR (MISCELLANEOUS) ×4 IMPLANT
SCRUB POVIDONE IODINE 4 OZ (MISCELLANEOUS) IMPLANT
SENSORWIRE 0.038 NOT ANGLED (WIRE) ×4
SET CYSTO W/LG BORE CLAMP LF (SET/KITS/TRAYS/PACK) ×4 IMPLANT
SOL .9 NS 3000ML IRR  AL (IV SOLUTION) ×2
SOL .9 NS 3000ML IRR UROMATIC (IV SOLUTION) ×2 IMPLANT
STENT URET 6FRX24 CONTOUR (STENTS) IMPLANT
STENT URET 6FRX26 CONTOUR (STENTS) IMPLANT
STENT URO INLAY 6FRX24CM (STENTS) ×8 IMPLANT
SURGILUBE 2OZ TUBE FLIPTOP (MISCELLANEOUS) ×4 IMPLANT
SYRINGE IRR TOOMEY STRL 70CC (SYRINGE) ×4 IMPLANT
WATER STERILE IRR 1000ML POUR (IV SOLUTION) ×4 IMPLANT
WIRE SENSOR 0.038 NOT ANGLED (WIRE) ×2 IMPLANT

## 2017-03-26 NOTE — Anesthesia Preprocedure Evaluation (Addendum)
Anesthesia Evaluation  Patient identified by MRN, date of birth, ID band Patient awake    Reviewed: Allergy & Precautions, NPO status , Patient's Chart, lab work & pertinent test results, reviewed documented beta blocker date and time   Airway Mallampati: II  TM Distance: >3 FB     Dental  (+) Chipped, Poor Dentition, Missing, Dental Advisory Given   Pulmonary asthma , COPD, former smoker,           Cardiovascular hypertension, Pt. on medications and Pt. on home beta blockers + CAD, + Cardiac Stents and + Peripheral Vascular Disease       Neuro/Psych PSYCHIATRIC DISORDERS Anxiety  Neuromuscular disease    GI/Hepatic hiatal hernia, PUD, GERD  Controlled,  Endo/Other    Renal/GU Renal disease     Musculoskeletal   Abdominal   Peds  Hematology  (+) anemia ,   Anesthesia Other Findings No cardiac symptoms.  Reproductive/Obstetrics                            Anesthesia Physical Anesthesia Plan  ASA: III  Anesthesia Plan: General   Post-op Pain Management:    Induction: Intravenous  PONV Risk Score and Plan:   Airway Management Planned: LMA  Additional Equipment:   Intra-op Plan:   Post-operative Plan:   Informed Consent: I have reviewed the patients History and Physical, chart, labs and discussed the procedure including the risks, benefits and alternatives for the proposed anesthesia with the patient or authorized representative who has indicated his/her understanding and acceptance.     Plan Discussed with: CRNA  Anesthesia Plan Comments:         Anesthesia Quick Evaluation

## 2017-03-26 NOTE — H&P (Signed)
02/22/2017  --> updated 11/12.  S/p cycle 1 chemo.  RRR.  CTAB.  Preop urine Cx neg. 8:24 AM   Tina [name] [birth year] [id number]  Referring provider: Einar Pheasant, MD [address] [place],  [id number]     Chief Complaint  Patient presents with  . Bladder Cancer   HPI: 82 year old female seen during recent hospital admission found to have an incidental bladder mass who underwent TURBT, bilateral ureteral stent placement on 02/14/2017. At that time and, the tumor was noted to be extensive, greater than 5 cm with a firm nodular mass involving the trigone and posterior bladder wall.  A Foley catheter was placed at the end of the procedure due to extensive resection of the tumor. The tumor was debulked but not completely resected.  Surgical pathology is consistent with small cell carcinoma of the bladder, invasive into the muscularis propria. Patient has been seen and evaluated by Dr. Janese Banks and further staging including PET scan and brain MR have been ordered. She is scheduled to get a port and start chemotherapy in the near future.  Foley catheter removed today. Her urine is been mostly clear. Overall, she's feeling better than during her admission. Her abdominal pain subsided. She will be on chronic platelet therapy following her episode of bowel ischemia and percutaneous intervention.   PMH:     Past Medical History:  Diagnosis Date  . Anemia   . Asthma   . BRCA positive   . CAD (coronary artery disease)   . COPD (chronic obstructive pulmonary disease) ([place])   . Emphysema lung ([place])   . GERD (gastroesophageal reflux disease)   . History of colon polyps   . Hypercholesterolemia   . Hyperglycemia   . Hypertension   . Lung nodules   . Osteoporosis   . Personal history of tobacco use, presenting hazards to health 11/25/2014  . Polycythemia vera(238.4)   . Renal cyst   . Skin cancer     Surgical History:      Past  Surgical History:  Procedure Laterality Date  . ANGIOPLASTY     coronary (x1)  . COLONOSCOPY WITH PROPOFOL N/A 03/02/2015   Procedure: COLONOSCOPY WITH PROPOFOL;  Surgeon: Lollie Sails, MD;  Location: [facility] ENDOSCOPY;  Service: Endoscopy;  Laterality: N/A;  . COLONOSCOPY WITH PROPOFOL N/A 02/13/2017   Procedure: COLONOSCOPY WITH PROPOFOL;  Surgeon: [name], MD;  Location: [facility] ENDOSCOPY;  Service: Endoscopy;  Laterality: N/A;  . CORONARY ANGIOPLASTY    . CORONARY ARTERY BYPASS GRAFT    . CYSTOSCOPY W/ RETROGRADES Bilateral 02/13/2017   Procedure: CYSTOSCOPY WITH RETROGRADE PYELOGRAM;  Surgeon: Hollice Espy, MD;  Location: [facility] ORS;  Service: Urology;  Laterality: Bilateral;  . CYSTOSCOPY WITH STENT PLACEMENT Bilateral 02/13/2017   Procedure: CYSTOSCOPY WITH STENT PLACEMENT;  Surgeon: Hollice Espy, MD;  Location: [facility] ORS;  Service: Urology;  Laterality: Bilateral;  . ESOPHAGOGASTRODUODENOSCOPY (EGD) WITH PROPOFOL N/A 02/13/2017   Procedure: ESOPHAGOGASTRODUODENOSCOPY (EGD) WITH PROPOFOL;  Surgeon: [name], MD;  Location: [facility] ENDOSCOPY;  Service: Endoscopy;  Laterality: N/A;  . SALPINGOOPHORECTOMY    . TONSILECTOMY/ADENOIDECTOMY WITH MYRINGOTOMY    . TRANSURETHRAL RESECTION OF BLADDER TUMOR N/A 02/13/2017   Procedure: TRANSURETHRAL RESECTION OF BLADDER TUMOR (TURBT);  Surgeon: Hollice Espy, MD;  Location: [facility] ORS;  Service: Urology;  Laterality: N/A;  . TUBAL LIGATION    . UPPER GI ENDOSCOPY  02/13/2017  . VISCERAL ARTERY INTERVENTION N/A 02/12/2017   Procedure: VISCERAL ARTERY INTERVENTION;  Surgeon: Leotis Pain  S, MD;  Location: [facility] CV LAB;  Service: Cardiovascular;  Laterality: N/A;    Home Medications:      Allergies as of 02/22/2017      Reactions   Evista [raloxifene] Other (See Comments)   Night sweats Night sweats Reaction:  Night sweats    Fluticasone-salmeterol Other (See Comments)   Reaction:  Cough                    Medication List            Accurate as of 02/22/17 11:59 PM. Always use your most recent med list.           albuterol 108 (90 Base) MCG/ACT inhaler Commonly known as:  PROVENTIL HFA;VENTOLIN HFA Inhale 2 puffs into the lungs every 6 (six) hours as needed for wheezing or shortness of breath.   amLODipine 5 MG tablet Commonly known as:  NORVASC TAKE 1 TABLET BY MOUTH  DAILY   aspirin 81 MG EC tablet Take 1 tablet (81 mg total) by mouth daily.   calcium carbonate 500 MG chewable tablet Commonly known as:  TUMS - dosed in mg elemental calcium Chew 1,500 mg by mouth once a week.   cholecalciferol 1000 units tablet Commonly known as:  VITAMIN D Take 1,000 Units by mouth daily.   clopidogrel 75 MG tablet Commonly known as:  PLAVIX Take 1 tablet (75 mg total) by mouth daily.   fluticasone 50 MCG/ACT nasal spray Commonly known as:  FLONASE Place 2 sprays into both nostrils daily.   ipratropium-albuterol 0.5-2.5 (3) MG/3ML Soln Commonly known as:  DUONEB Take 3 mLs by nebulization every 6 (six) hours as needed (for shortness of breath).   losartan 100 MG tablet Commonly known as:  COZAAR TAKE 1 TABLET BY MOUTH  DAILY   lovastatin 40 MG tablet Commonly known as:  MEVACOR TAKE 1 TABLET BY MOUTH  DAILY   metoprolol tartrate 25 MG tablet Commonly known as:  LOPRESSOR TAKE ONE-HALF TABLET BY  MOUTH TWO TIMES DAILY   ondansetron 4 MG tablet Commonly known as:  ZOFRAN Take 1 tablet (4 mg total) by mouth every 8 (eight) hours as needed for nausea or vomiting.   oxybutynin 5 MG tablet Commonly known as:  DITROPAN Take 1 tablet (5 mg total) by mouth every 8 (eight) hours as needed for bladder spasms.   oxyCODONE-acetaminophen 5-325 MG tablet Commonly known as:  PERCOCET/ROXICET Take 1-2 tablets by mouth every 6 (six) hours as needed for moderate pain.   pantoprazole 40 MG tablet Commonly known as:  PROTONIX Take 1 tablet (40 mg total) by mouth  daily.   potassium chloride 10 MEQ tablet Commonly known as:  K-DUR Take 1 tablet twice a day for two days , then take 1 tablet daily then on.   SYMBICORT 160-4.5 MCG/ACT inhaler Generic drug:  budesonide-formoterol USE 2 PUFFS TWO TIMES DAILY       Allergies:       Allergies  Allergen Reactions  . Evista [Raloxifene] Other (See Comments)    Night sweats Night sweats Reaction:  Night sweats   . Fluticasone-Salmeterol Other (See Comments)    Reaction:  Cough     Family History:      Family History  Problem Relation Age of Onset  . Cirrhosis Father        died age 42  . Alcohol abuse Father   . Asthma Mother   . Congestive Heart Failure Mother   .  Breast cancer Mother        2 (1/2 sisters)  . Osteoarthritis Mother   . Colon cancer Mother   . Lupus Sister   . Alcohol abuse Sister   . Ovarian cancer Sister   . Osteoporosis Sister   . Skin cancer Sister   . Breast cancer Cousin   . Breast cancer Maternal Aunt     Social History:  reports that she quit smoking about 13 years ago. Her smoking use included Cigarettes. She has a 45.00 pack-year smoking history. She has never used smokeless tobacco. She reports that she does not drink alcohol or use drugs.  ROS: UROLOGY Frequent Urination?: No Hard to postpone urination?: No Burning/pain with urination?: No Get up at night to urinate?: No Leakage of urine?: No Urine stream starts and stops?: No Trouble starting stream?: No Do you have to strain to urinate?: No Blood in urine?: No Urinary tract infection?: No Sexually transmitted disease?: No Injury to kidneys or bladder?: No Painful intercourse?: No Weak stream?: No Currently pregnant?: No Vaginal bleeding?: No Last menstrual period?: n  Gastrointestinal Nausea?: No Vomiting?: No Indigestion/heartburn?: No Diarrhea?: No Constipation?: No  Constitutional Fever: No Night sweats?: No Weight loss?: No Fatigue?:  No  Skin Skin rash/lesions?: No Itching?: No  Eyes Blurred vision?: No Double vision?: No  Ears/Nose/Throat Sore throat?: No Sinus problems?: No  Hematologic/Lymphatic Swollen glands?: No Easy bruising?: No  Cardiovascular Leg swelling?: No Chest pain?: No  Respiratory Cough?: No Shortness of breath?: No  Endocrine Excessive thirst?: No  Musculoskeletal Back pain?: No Joint pain?: No  Neurological Headaches?: No Dizziness?: No  Psychologic Depression?: No Anxiety?: No  Physical Exam: BP (!) 168/68 (BP Location: Left Arm, Patient Position: Sitting, Cuff Size: Normal)   Pulse 76   Ht 5' 4.5" (1.638 m)   Wt 137 lb 9.6 oz (62.4 kg)   BMI 23.25 kg/m   Constitutional:  Alert and oriented, No acute distress. HEENT: [place] AT, moist mucus membranes.  Trachea midline, no masses. Cardiovascular: No clubbing, cyanosis, or edema. Respiratory: Normal respiratory effort, no increased work of breathing. GI: Abdomen is soft, nontender, nondistended, no abdominal masses GU: No CVA tenderness.  Skin: No rashes, bruises or suspicious lesions. Neurologic: Grossly intact, no focal deficits, moving all 4 extremities. Psychiatric: Normal mood and affect.  Laboratory Data: RecentLabs       Lab Results  Component Value Date   WBC 18.8 (H) 02/14/2017   HGB 13.0 02/14/2017   HCT 38.8 02/14/2017   MCV 91.5 02/14/2017   PLT 218 02/14/2017      RecentLabs       Lab Results  Component Value Date   CREATININE 0.88 02/13/2017      RecentLabs       Lab Results  Component Value Date   HGBA1C 5.7 (H) 06/14/2016      Urinalysis N/a  Pertinent Imaging:  Assessment & Plan:    1. 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 Janese Banks Given  her age and multiple medical comorbidities, it unlikely that she'll be a surgical candidate for cystectomy. We'll reevaluate her after chemotherapy since he how she does with this. All questions are answered.  2. Ureteral obstruction Tumor invading the trigone and compromising UOs Ureteral stents placed times surgery The stents will need to be  exchanged and hopefully ultimately removed if tumor response to chemotherapy We'll work with chemotherapy schedule, possibly after completing cycle 1 tablet before cycle 2 for stent exchange  3. CKD (chronic kidney disease), stage III (HCC) Creatinine stable, improved following stent placement   Schedule stent exchange  Hollice Espy, MD  [facility] [address] [place], [place] [address] [phone]  Case was discussed with pathologist as well as Dr. Janese Banks and in tumor board today.

## 2017-03-26 NOTE — Telephone Encounter (Signed)
Patient came in as walk in to schedule Tx appts as per not scheduled at last visit on 03/15/17. Colette scheduled Inf addon, but did not complete check out appts.   S/W Azucena Freed to dbl book Dr Janese Banks for tomorrow @ 81, so that patient will be able to have lab and tx accommodated. :  11/13 labs: cbc/d , met c and see md with treatment: carbo , vp 16 day 1, vp 16 day 2, vp16 on day 3  Day 4 a neulasta shot ( pt does not want onpro) scheduled as requested. MF

## 2017-03-26 NOTE — Discharge Instructions (Signed)
You have a ureteral stent in place.  This is a tube that extends from your kidney to your bladder.  This may cause urinary bleeding, burning with urination, and urinary frequency.  Please call our office or present to the ED if you develop fevers >101 or pain which is not able to be controlled with oral pain medications.  You may be given either Flomax and/ or ditropan to help with bladder spasms and stent pain in addition to pain medications.   ° °[facility] °[address], [address] °[place], [place] [address] °[phone] ° ° ° °AMBULATORY SURGERY  °DISCHARGE INSTRUCTIONS ° ° °1) The drugs that you were given will stay in your system until tomorrow so for the next 24 hours you should not: ° °A) Drive an automobile °B) Make any legal decisions °C) Drink any alcoholic beverage ° ° °2) You may resume regular meals tomorrow.  Today it is better to start with liquids and gradually work up to solid foods. ° °You may eat anything you prefer, but it is better to start with liquids, then soup and crackers, and gradually work up to solid foods. ° ° °3) Please notify your doctor immediately if you have any unusual bleeding, trouble breathing, redness and pain at the surgery site, drainage, fever, or pain not relieved by medication. ° ° ° °4) Additional Instructions: ° ° ° ° ° ° ° °Please contact your physician with any problems or Same Day Surgery at [phone], Monday through Friday 6 am to 4 pm, or [place] [facility] [place] Main number at [phone]. °

## 2017-03-26 NOTE — Anesthesia Procedure Notes (Signed)
Procedure Name: LMA Insertion Date/Time: 03/26/2017 7:48 AM Performed by: Lesle Reek, CRNA Pre-anesthesia Checklist: Patient identified, Emergency Drugs available, Suction available, Patient being monitored and Timeout performed Patient Re-evaluated:Patient Re-evaluated prior to induction Oxygen Delivery Method: Circle system utilized Preoxygenation: Pre-oxygenation with 100% oxygen Induction Type: IV induction LMA: LMA inserted LMA Size: 3.5 Placement Confirmation: positive ETCO2,  CO2 detector and breath sounds checked- equal and bilateral Tube secured with: Tape

## 2017-03-26 NOTE — Op Note (Signed)
Date of procedure: 03/26/17  Preoperative diagnosis:  1. Muscle invasive small cell bladder cancer 2. Bilateral ureteral obstruction  Postoperative diagnosis:  1. Same as above  Procedure: 1. Cystoscopy 2. Bilateral ureteral stent exchange 3. Bladder biopsy  Surgeon: Hollice Espy, MD  Anesthesia: General  Complications: None  Intraoperative findings: Area of necrosis in the mid trigone but no obvious viable residual tumor.  Bilateral UOs appeared patent and lateral to the trigonal necrosis.  Remainder of the bladder appeared unremarkable.  Bilateral ureteral stents were obstructed.  EBL: Minimal  Specimens: Trigonal bladder biopsy  Drains: 6 x 24 French Bard Optima ureteral stent x2  Indication: Tina [name] is a 82 y.o. 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  She since undergone 1 cycle of carboplatinum with etoposide.  After reviewing the management options for treatment, he elected to proceed with the above surgical procedure(s). We have discussed the potential benefits and risks of the procedure, side effects of the proposed treatment, the likelihood of the patient achieving the goals of the procedure, and any potential problems that might occur during the procedure or recuperation. Informed consent has been obtained.  Description of procedure:  The patient was taken to the operating room and general anesthesia was induced.  The patient was placed in the dorsal lithotomy position, prepped and draped in the usual sterile fashion, and preoperative antibiotics were administered. A preoperative time-out was performed.   A 21 French scope was advanced per urethra into the bladder.  The bladder was carefully inspected and somewhat debris-filled.  This was irrigated several times to improve visualization.  At this point time, there was  an area of necrosis with a shaggy white appearance involving the majority of the trigone.  There is no active viable residual tumor.  The UOs appeared to be lateral to this area without any obvious involvement of tumor.  The remainder of the bladder was unremarkable.  Attention was first turned to the left stent.  The distal coil of the stent was grasped and brought to level the urethral meatus.  Tried on several occasions to cannulate the stent including cutting the distal coil but the stent itself was filled with debris and unable to be cannulated.  Using open-ended ureteral catheter a wire alongside the stent, eventually was able to advance a wire alongside the stent up to the level of the renal pelvis.  The stent was then removed leaving the wire in place.  A 6 x 24 French double-J Bard Optima stent was then advanced over the wire up to the level of the kidney.  Wire was withdrawn and a focal stone within the renal pelvis.  The wire was then fully withdrawn a full coils noted within the bladder.  Attention was then turned to the right ureteral orifice and the same exact procedure was performed.  Again, this stent was obstructed and unable to be cannulated.  A wire was able to be placed alongside of the stent prior to its removal.  A 6 x 24 French Bard Optima stent was then advanced over the wire up to the level of the renal pelvis with a full coil noted both within the renal pelvis as well as in the bladder upon wire removal.  At this point in time, to assess for ongoing residual tumor, if you cold cup biopsies were taken of the trigone which were passed off the  field as trigonal biopsy.  There was then fulgurated using Bugbee electrocautery.  The bladder was then drained.  The patient was a clean and dry, repositioned in supine position, reversed from anesthesia, taken to the PACU in stable condition.  Plan: Overall, her bladder appears grossly improved.  She has no active obvious residual tumor and appears  to be primarily necrotic.  Pending the biopsy results, if there is no viable tumor residual, we may be able to remove her stents.  I will see her back in about 4 weeks following her next round of chemotherapy.  If biopsy results are negative and she continues to do well, we will consider removing her stents, likely serially.  Findings were discussed with her family today.  Hollice Espy, M.D.

## 2017-03-26 NOTE — Transfer of Care (Signed)
Immediate Anesthesia Transfer of Care Note  Patient: Tina [name]  Procedure(s) Performed: CYSTOSCOPY WITH STENT EXCHANGE (Bilateral Ureter) CYSTOSCOPY WITH BIOPSY (Bladder)  Patient Location: PACU  Anesthesia Type:General  Level of Consciousness: awake, alert  and oriented  Airway & Oxygen Therapy: Patient Spontanous Breathing  Post-op Assessment: Report given to RN  Post vital signs: Reviewed and stable  Last Vitals:  Vitals:   03/26/17 0625 03/26/17 0836  BP: (!) 152/57 (!) 137/55  Pulse: 65 81  Resp: 18 (!) 23  Temp: 36.8 C (!) 36.2 C  SpO2: 97% 100%    Last Pain:  Vitals:   03/26/17 0625  TempSrc: Tympanic         Complications: No apparent anesthesia complications

## 2017-03-26 NOTE — Anesthesia Postprocedure Evaluation (Signed)
Anesthesia Post Note  Patient: Tina [name]  Procedure(s) Performed: CYSTOSCOPY WITH STENT EXCHANGE (Bilateral Ureter) CYSTOSCOPY WITH BIOPSY (Bladder)  Patient location during evaluation: PACU Anesthesia Type: General Level of consciousness: awake and alert Pain management: pain level controlled Vital Signs Assessment: post-procedure vital signs reviewed and stable Respiratory status: spontaneous breathing, nonlabored ventilation, respiratory function stable and patient connected to nasal cannula oxygen Cardiovascular status: blood pressure returned to baseline and stable Postop Assessment: no apparent nausea or vomiting Anesthetic complications: no     Last Vitals:  Vitals:   03/26/17 0929 03/26/17 0959  BP: (!) 174/73 (!) 159/70  Pulse: 89 80  Resp: 18   Temp: (!) 36.1 C   SpO2: 97% 100%    Last Pain:  Vitals:   03/26/17 0625  TempSrc: Tympanic                 [name] S

## 2017-03-26 NOTE — Anesthesia Post-op Follow-up Note (Signed)
Anesthesia QCDR form completed.        

## 2017-03-27 ENCOUNTER — Inpatient Hospital Stay: Payer: Medicare Other

## 2017-03-27 ENCOUNTER — Telehealth: Payer: Self-pay

## 2017-03-27 ENCOUNTER — Encounter: Payer: Self-pay | Admitting: Oncology

## 2017-03-27 ENCOUNTER — Inpatient Hospital Stay (HOSPITAL_BASED_OUTPATIENT_CLINIC_OR_DEPARTMENT_OTHER): Payer: Medicare Other | Admitting: Oncology

## 2017-03-27 VITALS — BP 162/66 | HR 83 | Temp 98.9°F | Resp 14 | Wt 130.0 lb

## 2017-03-27 DIAGNOSIS — E871 Hypo-osmolality and hyponatremia: Secondary | ICD-10-CM | POA: Diagnosis not present

## 2017-03-27 DIAGNOSIS — Z86010 Personal history of colonic polyps: Secondary | ICD-10-CM

## 2017-03-27 DIAGNOSIS — Z79899 Other long term (current) drug therapy: Secondary | ICD-10-CM

## 2017-03-27 DIAGNOSIS — M810 Age-related osteoporosis without current pathological fracture: Secondary | ICD-10-CM

## 2017-03-27 DIAGNOSIS — Z87891 Personal history of nicotine dependence: Secondary | ICD-10-CM

## 2017-03-27 DIAGNOSIS — D6959 Other secondary thrombocytopenia: Secondary | ICD-10-CM | POA: Diagnosis not present

## 2017-03-27 DIAGNOSIS — E876 Hypokalemia: Secondary | ICD-10-CM

## 2017-03-27 DIAGNOSIS — J449 Chronic obstructive pulmonary disease, unspecified: Secondary | ICD-10-CM | POA: Diagnosis not present

## 2017-03-27 DIAGNOSIS — R509 Fever, unspecified: Secondary | ICD-10-CM | POA: Diagnosis not present

## 2017-03-27 DIAGNOSIS — J45909 Unspecified asthma, uncomplicated: Secondary | ICD-10-CM | POA: Diagnosis not present

## 2017-03-27 DIAGNOSIS — Z1501 Genetic susceptibility to malignant neoplasm of breast: Secondary | ICD-10-CM

## 2017-03-27 DIAGNOSIS — I2510 Atherosclerotic heart disease of native coronary artery without angina pectoris: Secondary | ICD-10-CM | POA: Diagnosis not present

## 2017-03-27 DIAGNOSIS — E878 Other disorders of electrolyte and fluid balance, not elsewhere classified: Secondary | ICD-10-CM | POA: Diagnosis not present

## 2017-03-27 DIAGNOSIS — N179 Acute kidney failure, unspecified: Secondary | ICD-10-CM

## 2017-03-27 DIAGNOSIS — D751 Secondary polycythemia: Secondary | ICD-10-CM

## 2017-03-27 DIAGNOSIS — I700 Atherosclerosis of aorta: Secondary | ICD-10-CM

## 2017-03-27 DIAGNOSIS — C679 Malignant neoplasm of bladder, unspecified: Secondary | ICD-10-CM

## 2017-03-27 DIAGNOSIS — Z85828 Personal history of other malignant neoplasm of skin: Secondary | ICD-10-CM

## 2017-03-27 DIAGNOSIS — I10 Essential (primary) hypertension: Secondary | ICD-10-CM

## 2017-03-27 DIAGNOSIS — Z5111 Encounter for antineoplastic chemotherapy: Secondary | ICD-10-CM | POA: Diagnosis not present

## 2017-03-27 DIAGNOSIS — Z87442 Personal history of urinary calculi: Secondary | ICD-10-CM

## 2017-03-27 DIAGNOSIS — E7800 Pure hypercholesterolemia, unspecified: Secondary | ICD-10-CM | POA: Diagnosis not present

## 2017-03-27 DIAGNOSIS — Z7689 Persons encountering health services in other specified circumstances: Secondary | ICD-10-CM

## 2017-03-27 DIAGNOSIS — K219 Gastro-esophageal reflux disease without esophagitis: Secondary | ICD-10-CM | POA: Diagnosis not present

## 2017-03-27 DIAGNOSIS — Z8711 Personal history of peptic ulcer disease: Secondary | ICD-10-CM

## 2017-03-27 LAB — COMPREHENSIVE METABOLIC PANEL
ALBUMIN: 3.2 g/dL — AB (ref 3.5–5.0)
ALK PHOS: 72 U/L (ref 38–126)
ALT: 11 U/L — ABNORMAL LOW (ref 14–54)
ANION GAP: 12 (ref 5–15)
AST: 18 U/L (ref 15–41)
BILIRUBIN TOTAL: 0.5 mg/dL (ref 0.3–1.2)
BUN: 10 mg/dL (ref 6–20)
CO2: 25 mmol/L (ref 22–32)
CREATININE: 1.05 mg/dL — AB (ref 0.44–1.00)
Calcium: 9.1 mg/dL (ref 8.9–10.3)
Chloride: 102 mmol/L (ref 101–111)
GFR calc Af Amer: 57 mL/min — ABNORMAL LOW (ref >60)
GFR calc non Af Amer: 49 mL/min — ABNORMAL LOW (ref >60)
GLUCOSE: 147 mg/dL — AB (ref 65–99)
Potassium: 3.2 mmol/L — ABNORMAL LOW (ref 3.5–5.1)
Sodium: 139 mmol/L (ref 135–145)
Total Protein: 6.9 g/dL (ref 6.5–8.1)

## 2017-03-27 LAB — CBC WITH DIFFERENTIAL/PLATELET
BASOS PCT: 1 %
Basophils Absolute: 0.1 10*3/uL (ref 0–0.1)
Eosinophils Absolute: 0 10*3/uL (ref 0–0.7)
Eosinophils Relative: 0 %
HEMATOCRIT: 35.4 % (ref 35.0–47.0)
HEMOGLOBIN: 12 g/dL (ref 12.0–16.0)
LYMPHS ABS: 1.6 10*3/uL (ref 1.0–3.6)
Lymphocytes Relative: 11 %
MCH: 30.6 pg (ref 26.0–34.0)
MCHC: 33.8 g/dL (ref 32.0–36.0)
MCV: 90.6 fL (ref 80.0–100.0)
MONOS PCT: 7 %
Monocytes Absolute: 1 10*3/uL — ABNORMAL HIGH (ref 0.2–0.9)
NEUTROS ABS: 11.8 10*3/uL — AB (ref 1.4–6.5)
NEUTROS PCT: 81 %
Platelets: 671 10*3/uL — ABNORMAL HIGH (ref 150–440)
RBC: 3.91 MIL/uL (ref 3.80–5.20)
RDW: 14 % (ref 11.5–14.5)
WBC: 14.5 10*3/uL — ABNORMAL HIGH (ref 3.6–11.0)

## 2017-03-27 LAB — SURGICAL PATHOLOGY

## 2017-03-27 MED ORDER — PALONOSETRON HCL INJECTION 0.25 MG/5ML
0.2500 mg | Freq: Once | INTRAVENOUS | Status: AC
  Administered 2017-03-27: 0.25 mg via INTRAVENOUS
  Filled 2017-03-27: qty 5

## 2017-03-27 MED ORDER — SODIUM CHLORIDE 0.9 % IV SOLN
Freq: Once | INTRAVENOUS | Status: AC
  Administered 2017-03-27: 11:00:00 via INTRAVENOUS
  Filled 2017-03-27: qty 1000

## 2017-03-27 MED ORDER — HEPARIN SOD (PORK) LOCK FLUSH 100 UNIT/ML IV SOLN
500.0000 [IU] | Freq: Once | INTRAVENOUS | Status: AC | PRN
  Administered 2017-03-27: 500 [IU]

## 2017-03-27 MED ORDER — POTASSIUM CHLORIDE 20 MEQ/100ML IV SOLN
20.0000 meq | Freq: Once | INTRAVENOUS | Status: AC
  Administered 2017-03-27: 20 meq via INTRAVENOUS
  Filled 2017-03-27: qty 100

## 2017-03-27 MED ORDER — ETOPOSIDE CHEMO INJECTION 1 GM/50ML
100.0000 mg/m2 | Freq: Once | INTRAVENOUS | Status: AC
  Administered 2017-03-27: 170 mg via INTRAVENOUS
  Filled 2017-03-27: qty 8.5

## 2017-03-27 MED ORDER — SODIUM CHLORIDE 0.9 % IV SOLN: 10.0000 mg | Freq: Once | INTRAVENOUS | Status: DC

## 2017-03-27 MED ORDER — SODIUM CHLORIDE 0.9 % IV SOLN: Freq: Once | INTRAVENOUS | Status: DC

## 2017-03-27 MED ORDER — DEXAMETHASONE SODIUM PHOSPHATE 10 MG/ML IJ SOLN
10.0000 mg | Freq: Once | INTRAMUSCULAR | Status: AC
  Administered 2017-03-27: 10 mg via INTRAVENOUS
  Filled 2017-03-27: qty 1

## 2017-03-27 MED ORDER — SODIUM CHLORIDE 0.9 % IV SOLN
260.0000 mg | Freq: Once | INTRAVENOUS | Status: AC
  Administered 2017-03-27: 260 mg via INTRAVENOUS
  Filled 2017-03-27: qty 26

## 2017-03-27 MED ORDER — HEPARIN SOD (PORK) LOCK FLUSH 100 UNIT/ML IV SOLN
INTRAVENOUS | Status: AC
  Filled 2017-03-27: qty 5

## 2017-03-27 NOTE — Progress Notes (Signed)
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 She has had jak 2 testing as well as EPO levels in the past which was unremarkable. She gets a phlebotomy when her hematocrit is more than 50. She was last seen by me in June 2018. She also has a history of lung nodules and was getting low-dose lung cancer screening scans on 07/04/2014 when her insurance stopped paying.   She recently presented to the hospital in October 2018 with symptoms of nausea and diarrhea weight loss and fatigue. She was noted to have irregular wall thickening in the ascending colon suspicious for colon cancer as well as 4.2 x 3.2 cm posterior bladder wall mass that was incidentally noted. She was also seen by vascular surgery and underwent abdominal angiogram with stent placement at the [facility]. She is on aspirin and Plavix for the same. Patient underwent colonoscopy for concerns of ischemic colitis which showed irregular thickening of the descending colon consistent with ischemic colitis. EGD showed a small hiatal hernia. Losing gastric ulcers were noted which was treated with argon photocoagulation.  With regards to the posterior bladder wall mass she was seen by Dr. Erlene Quan and underwent TURBT with bilateral ureteral stent placement on 02/13/2017. Pathology showed small cell carcinoma with muscularis propria invasion   Interval history- she is not having any more fevers. She  had permanent ureteral stents placed yesterday by Dr. Erlene Quan and she is on keflex prophylactically. She reports urinary frequency and urgency since her stent placement and she has to go foten during night and day which is making her tired. No nausea. Leg swelling is about the same. Her baseline weight is about 130 lb and she reports no change in the recent past  ECOG PS- 1 Pain scale- 0   Review of systems- Review of Systems  Constitutional: Positive for malaise/fatigue. Negative for chills, fever and weight loss.  HENT: Negative for congestion, ear discharge and nosebleeds.   Eyes: Negative for blurred vision.  Respiratory: Negative for cough, hemoptysis, sputum production, shortness of breath and wheezing.   Cardiovascular: Positive for leg swelling. Negative for chest pain, palpitations, orthopnea and claudication.  Gastrointestinal: Negative for abdominal pain, blood in stool, constipation, diarrhea, heartburn, melena, nausea and vomiting.  Genitourinary: Positive for frequency and urgency. Negative for dysuria, flank pain and hematuria.  Musculoskeletal: Negative for back pain, joint pain and myalgias.  Skin: Negative for rash.  Neurological: Negative for dizziness, tingling, focal weakness, seizures, weakness and headaches.  Endo/Heme/Allergies: Does not bruise/bleed easily.  Psychiatric/Behavioral: Negative for depression and suicidal ideas. The patient does not have insomnia.     Allergies  Allergen Reactions  . Evista [Raloxifene] Other (See Comments)    Night sweats   . Fluticasone-Salmeterol Other (See Comments)    Cough, "chokes me"      Past Medical History:  Diagnosis Date  . Anemia   . Asthma   . Bladder cancer ([place])   . BRCA positive   . CAD (coronary artery disease)   .  COPD (chronic obstructive pulmonary disease) ([place])   . Emphysema lung ([place])   . GERD (gastroesophageal reflux disease)   . History of colon polyps   . Hypercholesterolemia   . Hyperglycemia    . Hypertension   . Lung nodules   . Osteoporosis   . Personal history of tobacco use, presenting hazards to health 11/25/2014  . Polycythemia vera(238.4)   . Renal cyst   . Skin cancer      Past Surgical History:  Procedure Laterality Date  . ANGIOPLASTY     coronary (x1)  . CORONARY ANGIOPLASTY    . CORONARY ARTERY BYPASS GRAFT    . SALPINGOOPHORECTOMY    . TONSILECTOMY/ADENOIDECTOMY WITH MYRINGOTOMY    . TUBAL LIGATION    . UPPER GI ENDOSCOPY  02/13/2017    Social History   Socioeconomic History  . Marital status: Single    Spouse name: Not on file  . Number of children: 2  . Years of education: Not on file  . Highest education level: Not on file  Social Needs  . Financial resource strain: Not on file  . Food insecurity - worry: Not on file  . Food insecurity - inability: Not on file  . Transportation needs - medical: Not on file  . Transportation needs - non-medical: Not on file  Occupational History  . Not on file  Tobacco Use  . Smoking status: Former Smoker    Packs/day: 1.00    Years: 45.00    Pack years: 45.00    Types: Cigarettes    Last attempt to quit: 11/11/2003    Years since quitting: 13.3  . Smokeless tobacco: Never Used  Substance and Sexual Activity  . Alcohol use: No    Alcohol/week: 0.0 oz    Comment: occasional  . Drug use: No  . Sexual activity: No  Other Topics Concern  . Not on file  Social History Narrative  . Not on file    Family History  Problem Relation Age of Onset  . Cirrhosis Father        died age 60  . Alcohol abuse Father   . Asthma Mother   . Congestive Heart Failure Mother   . Breast cancer Mother        2 (1/2 sisters)  . Osteoarthritis Mother   . Colon cancer Mother   . Lupus Sister   . Alcohol abuse Sister   . Ovarian cancer Sister   . Osteoporosis Sister   . Skin cancer Sister   . Breast cancer Cousin   . Breast cancer Maternal Aunt      Current Outpatient Medications:  .  albuterol (PROVENTIL  HFA;VENTOLIN HFA) 108 (90 Base) MCG/ACT inhaler, Inhale 2 puffs into the lungs every 6 (six) hours as needed for wheezing or shortness of breath., Disp: 1 Inhaler, Rfl: 2 .  amLODipine (NORVASC) 5 MG tablet, TAKE 1 TABLET BY MOUTH  DAILY, Disp: 90 tablet, Rfl: 1 .  aspirin EC 81 MG EC tablet, Take 1 tablet (81 mg total) by mouth daily., Disp: 30 tablet, Rfl: 1 .  CALCIUM CARBONATE PO, Take 1 tablet by mouth every Monday, Wednesday, and Friday., Disp: , Rfl:  .  cholecalciferol (VITAMIN D) 1000 units tablet, Take 1,000 Units by mouth daily., Disp: , Rfl:  .  clopidogrel (PLAVIX) 75 MG tablet, Take 1 tablet (75 mg total) by mouth daily., Disp: 30 tablet, Rfl: 2 .  fluticasone (FLONASE) 50 MCG/ACT nasal spray, Place 2 sprays into both nostrils  daily. (Patient taking differently: Place 2 sprays into both nostrils daily as needed for allergies (congestion). ), Disp: 16 g, Rfl: 5 .  ipratropium-albuterol (DUONEB) 0.5-2.5 (3) MG/3ML SOLN, Take 3 mLs by nebulization every 6 (six) hours as needed (for shortness of breath)., Disp: 36 mL, Rfl: 0 .  cephALEXin (KEFLEX) 500 MG capsule, Take 1 capsule (500 mg total) 3 (three) times daily for 10 days by mouth., Disp: 9 capsule, Rfl: 0 .  levofloxacin (LEVAQUIN) 500 MG tablet, Take 1 tablet (500 mg total) by mouth daily., Disp: 5 tablet, Rfl: 0 .  lidocaine-prilocaine (EMLA) cream, Apply to affected area once (Patient taking differently: Apply 1 application topically daily as needed. Prior to port access), Disp: 30 g, Rfl: 3 .  LORazepam (ATIVAN) 0.5 MG tablet, Take 1 tablet (0.5 mg total) by mouth every 6 (six) hours as needed (Nausea or vomiting)., Disp: 30 tablet, Rfl: 0 .  losartan (COZAAR) 100 MG tablet, TAKE 1 TABLET BY MOUTH  DAILY (Patient taking differently: TAKE 170m BY MOUTH  DAILY), Disp: 90 tablet, Rfl: 1 .  lovastatin (MEVACOR) 40 MG tablet, TAKE 1 TABLET BY MOUTH  DAILY (Patient taking differently: TAKE 431mBY MOUTH  DAILY), Disp: 90 tablet, Rfl: 1 .   metoprolol tartrate (LOPRESSOR) 25 MG tablet, TAKE ONE-HALF TABLET BY  MOUTH TWO TIMES DAILY (Patient taking differently: TAKE 12.43m59mY MOUTH TWO TIMES DAILY), Disp: 90 tablet, Rfl: 1 .  ondansetron (ZOFRAN) 8 MG tablet, Take 1 tablet (8 mg total) by mouth 2 (two) times daily as needed for refractory nausea / vomiting. Start on day 3 after carboplatin chemo., Disp: 30 tablet, Rfl: 1 .  oxybutynin (DITROPAN) 5 MG tablet, Take 1 tablet (5 mg total) by mouth every 8 (eight) hours as needed for bladder spasms., Disp: 30 tablet, Rfl: 0 .  oxyCODONE-acetaminophen (PERCOCET/ROXICET) 5-325 MG tablet, Take 1-2 tablets by mouth every 6 (six) hours as needed for moderate pain., Disp: 30 tablet, Rfl: 0 .  pantoprazole (PROTONIX) 40 MG tablet, Take 1 tablet (40 mg total) by mouth daily., Disp: 30 tablet, Rfl: 1 .  prochlorperazine (COMPAZINE) 10 MG tablet, Take 1 tablet (10 mg total) by mouth every 6 (six) hours as needed (Nausea or vomiting)., Disp: 30 tablet, Rfl: 1 .  SYMBICORT 160-4.5 MCG/ACT inhaler, USE 2 PUFFS TWO TIMES DAILY, Disp: 30.6 g, Rfl: 1 No current facility-administered medications for this visit.   Facility-Administered Medications Ordered in Other Visits:  .  CARBOplatin (PARAPLATIN) 260 mg in sodium chloride 0.9 % 250 mL chemo infusion, 260 mg, Intravenous, Once, RaoSindy GuadeloupeD .  etoposide (VEPESID) 170 mg in sodium chloride 0.9 % 500 mL chemo infusion, 100 mg/m2 (Treatment Plan Recorded), Intravenous, Once, RaoSindy GuadeloupeD .  potassium chloride 20 mEq in 100 mL IVPB, 20 mEq, Intravenous, Once, RaoSindy GuadeloupeD, Last Rate: 100 mL/hr at 03/27/17 1150, 20 mEq at 03/27/17 1150  Physical exam:  Vitals:   03/27/17 1025 03/27/17 1031  BP:  (!) 162/66  Pulse:  83  Resp:  14  Temp:  98.9 F (37.2 C)  TempSrc:  Tympanic  Weight: 130 lb (59 kg)    Physical Exam  Constitutional: She is oriented to person, place, and time.  Thin elderly female in no acute distress  HENT:  Head:  Normocephalic and atraumatic.  Eyes: EOM are normal. Pupils are equal, round, and reactive to light.  Neck: Normal range of motion.  Cardiovascular: Normal rate, regular rhythm and normal heart  sounds.  Pulmonary/Chest: Effort normal and breath sounds normal.  Abdominal: Soft. Bowel sounds are normal.  Musculoskeletal: She exhibits edema.  Neurological: She is alert and oriented to person, place, and time.  Skin: Skin is warm and dry.     CMP Latest Ref Rng & Units 03/27/2017  Glucose 65 - 99 mg/dL 147(H)  BUN 6 - 20 mg/dL 10  Creatinine 0.44 - 1.00 mg/dL 1.05(H)  Sodium 135 - 145 mmol/L 139  Potassium 3.5 - 5.1 mmol/L 3.2(L)  Chloride 101 - 111 mmol/L 102  CO2 22 - 32 mmol/L 25  Calcium 8.9 - 10.3 mg/dL 9.1  Total Protein 6.5 - 8.1 g/dL 6.9  Total Bilirubin 0.3 - 1.2 mg/dL 0.5  Alkaline Phos 38 - 126 U/L 72  AST 15 - 41 U/L 18  ALT 14 - 54 U/L 11(L)   CBC Latest Ref Rng & Units 03/27/2017  WBC 3.6 - 11.0 K/uL 14.5(H)  Hemoglobin 12.0 - 16.0 g/dL 12.0  Hematocrit 35.0 - 47.0 % 35.4  Platelets 150 - 440 K/uL 671(H)    No images are attached to the encounter.  Mr Jeri Cos Wo Contrast  Result Date: 02/27/2017 CLINICAL DATA:  Small cell cancer of the urinary bladder EXAM: MRI HEAD WITHOUT AND WITH CONTRAST TECHNIQUE: Multiplanar, multiecho pulse sequences of the brain and surrounding structures were obtained without and with intravenous contrast. CONTRAST:  47m MULTIHANCE GADOBENATE DIMEGLUMINE 529 MG/ML IV SOLN COMPARISON:  PET CT 02/27/2017 Brain MRI 08/19/2013 FINDINGS: Brain: The midline structures are normal. There is no focal diffusion restriction to indicate acute infarct. The brain parenchymal signal is normal and there is no mass lesion. No intraparenchymal hematoma or chronic microhemorrhage. Brain volume is normal for age without lobar predominant atrophy. The dura is normal and there is no extra-axial collection. Vascular: Major intracranial arterial and venous sinus  flow voids are preserved. Skull and upper cervical spine: The visualized skull base, calvarium, upper cervical spine and extracranial soft tissues are normal. Sinuses/Orbits: No fluid levels or advanced mucosal thickening. No mastoid or middle ear effusion. Normal orbits. IMPRESSION: Normal MRI brain for age. No intracranial or calvarial metastatic disease. Electronically Signed   By: KUlyses JarredM.D.   On: 02/27/2017 13:38   Nm Pet Image Initial (pi) Skull Base To Thigh  Result Date: 02/27/2017 CLINICAL DATA:  Initial treatment strategy for small cell carcinoma of the bladder, invasive into the muscularis propria. EXAM: NUCLEAR MEDICINE PET SKULL BASE TO THIGH TECHNIQUE: 13.0 mCi F-18 FDG was injected intravenously. Full-ring PET imaging was performed from the skull base to thigh after the radiotracer. CT data was obtained and used for attenuation correction and anatomic localization. FASTING BLOOD GLUCOSE:  Value: 119 mg/dl COMPARISON:  There are FINDINGS: NECK: No hypermetabolic lymph nodes in the neck. CHEST: No hypermetabolic mediastinal or hilar nodes. No suspicious pulmonary nodules on the CT scan. Coronary artery calcification is evident. There is atherosclerotic calcification in the wall of the thoracic aorta. ABDOMEN/PELVIS: No abnormal hypermetabolic activity within the liver, pancreas, adrenal glands, or spleen. No hypermetabolic lymph nodes in the abdomen or pelvis. SMA stent is associated with prominent calcific plaque/thrombus in the lumen of the abdominal aorta, similar to 02/11/2017. Right double-J internal ureteral stent is identified with proximal loop formed in the renal pelvis and distal loop formed in the bladder. The patient also is a left internal ureteral stent with proximal loop formed low in the renal pelvis, near the UPJ. Distal loop of the left stent is formed  in the bladder lumen. Large left renal cysts noted. Diverticular changes noted in the left colon. SKELETON: No focal  hypermetabolic activity to suggest skeletal metastasis. IMPRESSION: 1. No evidence for hypermetabolic metastatic disease in the neck, chest, abdomen, or pelvis. 2. No pulmonary nodules evident on CT imaging. 3. Coronary artery and thoracoabdominal aortic atherosclerosis. Electronically Signed   By: Misty Stanley M.D.   On: 02/27/2017 15:00     Assessment and plan- Patient is a 82 y.o. female with muscle invasive small cell carcinoma of the bladder here for on treatment assessment prior to cycle 2 of carboplatin/ etoposide  Counts ok to proceed with cycle 2 of carbo/etoposide. Etoposide only day 2 tomorrow and day 3. She will get neulasta on day 4 instead of onpro. Leucocytosis and thrombocytosis likely reactive possibly from procedure yesterday. Continue to monitor  Repeat cbc, bmp on 04/09/17  I will see her back in 3 weeks for cycle 3 of carbo/etoposide  I will touch base with Dr. Erlene Quan regarding her urinary symptoms of urinary frequency and urgency. Recent urine culture negative.    Visit Diagnosis 1. Small cell carcinoma of bladder ([place])   2. Encounter for antineoplastic chemotherapy      Dr. Randa Evens, MD, MPH [facility] at [facility] Pager- [phone] 03/27/2017 12:01 PM

## 2017-03-27 NOTE — Telephone Encounter (Signed)
LMOM

## 2017-03-27 NOTE — Progress Notes (Signed)
Patient here today for follow up with labs and treatment. She is currently on Keflex to prevent infection because she had her stents placed yesterday. She reports having to urinate so frequently that she is unable to get rest. She says she is worn out from going to the bathroom so much. She denies having any pain. She is retaining more fluid in her ankles and legs today and her weight is up 9 lbs from her last visit.

## 2017-03-27 NOTE — Telephone Encounter (Signed)
-----   Message from Hollice Espy, MD sent at 03/27/2017  1:34 PM EST ----- Vikki Ports, please call and talk to her.  Either mybetriq samples or ditropan 5 mg tid prn.  Should improve.  I biopsied her bladder too which makes it worse.    Hollice Espy, MD   ----- Message ----- From: Sindy Guadeloupe, MD Sent: 03/27/2017  12:25 PM To: Hollice Espy, MD  She has been having urinary frequency and urgency since stent placement. Urine cx negative. What else can I give her for those symptoms?  Thanks, Astrid Divine

## 2017-03-28 ENCOUNTER — Inpatient Hospital Stay: Payer: Medicare Other

## 2017-03-28 VITALS — BP 135/69 | HR 76 | Temp 98.2°F | Resp 18 | Wt 130.0 lb

## 2017-03-28 DIAGNOSIS — R509 Fever, unspecified: Secondary | ICD-10-CM | POA: Diagnosis not present

## 2017-03-28 DIAGNOSIS — E871 Hypo-osmolality and hyponatremia: Secondary | ICD-10-CM | POA: Diagnosis not present

## 2017-03-28 DIAGNOSIS — E878 Other disorders of electrolyte and fluid balance, not elsewhere classified: Secondary | ICD-10-CM | POA: Diagnosis not present

## 2017-03-28 DIAGNOSIS — C679 Malignant neoplasm of bladder, unspecified: Secondary | ICD-10-CM

## 2017-03-28 DIAGNOSIS — Z7689 Persons encountering health services in other specified circumstances: Secondary | ICD-10-CM | POA: Diagnosis not present

## 2017-03-28 DIAGNOSIS — Z5111 Encounter for antineoplastic chemotherapy: Secondary | ICD-10-CM | POA: Diagnosis not present

## 2017-03-28 MED ORDER — HEPARIN SOD (PORK) LOCK FLUSH 100 UNIT/ML IV SOLN
500.0000 [IU] | Freq: Once | INTRAVENOUS | Status: AC | PRN
  Administered 2017-03-28: 500 [IU]
  Filled 2017-03-28: qty 5

## 2017-03-28 MED ORDER — SODIUM CHLORIDE 0.9 % IV SOLN
Freq: Once | INTRAVENOUS | Status: AC
  Administered 2017-03-28: 09:00:00 via INTRAVENOUS
  Filled 2017-03-28: qty 1000

## 2017-03-28 MED ORDER — SODIUM CHLORIDE 0.9 % IV SOLN: 10.0000 mg | Freq: Once | INTRAVENOUS | Status: DC

## 2017-03-28 MED ORDER — DEXAMETHASONE SODIUM PHOSPHATE 10 MG/ML IJ SOLN
10.0000 mg | Freq: Once | INTRAMUSCULAR | Status: AC
  Administered 2017-03-28: 10 mg via INTRAVENOUS
  Filled 2017-03-28: qty 1

## 2017-03-28 MED ORDER — SODIUM CHLORIDE 0.9 % IV SOLN
100.0000 mg/m2 | Freq: Once | INTRAVENOUS | Status: AC
  Administered 2017-03-28: 170 mg via INTRAVENOUS
  Filled 2017-03-28: qty 8.5

## 2017-03-28 NOTE — Telephone Encounter (Signed)
Spoke with pt husband who stated he would have pt return call.

## 2017-03-29 ENCOUNTER — Inpatient Hospital Stay: Payer: Medicare Other

## 2017-03-29 ENCOUNTER — Ambulatory Visit: Payer: Medicare Other

## 2017-03-29 ENCOUNTER — Telehealth: Payer: Self-pay | Admitting: Internal Medicine

## 2017-03-29 VITALS — BP 142/66 | HR 82 | Temp 98.5°F | Resp 18

## 2017-03-29 DIAGNOSIS — C679 Malignant neoplasm of bladder, unspecified: Secondary | ICD-10-CM

## 2017-03-29 DIAGNOSIS — Z483 Aftercare following surgery for neoplasm: Secondary | ICD-10-CM | POA: Diagnosis not present

## 2017-03-29 DIAGNOSIS — I2510 Atherosclerotic heart disease of native coronary artery without angina pectoris: Secondary | ICD-10-CM | POA: Diagnosis not present

## 2017-03-29 DIAGNOSIS — E871 Hypo-osmolality and hyponatremia: Secondary | ICD-10-CM | POA: Diagnosis not present

## 2017-03-29 DIAGNOSIS — I10 Essential (primary) hypertension: Secondary | ICD-10-CM | POA: Diagnosis not present

## 2017-03-29 DIAGNOSIS — J449 Chronic obstructive pulmonary disease, unspecified: Secondary | ICD-10-CM | POA: Diagnosis not present

## 2017-03-29 DIAGNOSIS — K551 Chronic vascular disorders of intestine: Secondary | ICD-10-CM | POA: Diagnosis not present

## 2017-03-29 DIAGNOSIS — R509 Fever, unspecified: Secondary | ICD-10-CM | POA: Diagnosis not present

## 2017-03-29 DIAGNOSIS — Z7689 Persons encountering health services in other specified circumstances: Secondary | ICD-10-CM | POA: Diagnosis not present

## 2017-03-29 DIAGNOSIS — E878 Other disorders of electrolyte and fluid balance, not elsewhere classified: Secondary | ICD-10-CM | POA: Diagnosis not present

## 2017-03-29 DIAGNOSIS — Z5111 Encounter for antineoplastic chemotherapy: Secondary | ICD-10-CM | POA: Diagnosis not present

## 2017-03-29 MED ORDER — HEPARIN SOD (PORK) LOCK FLUSH 100 UNIT/ML IV SOLN
500.0000 [IU] | Freq: Once | INTRAVENOUS | Status: AC | PRN
  Administered 2017-03-29: 500 [IU]
  Filled 2017-03-29: qty 5

## 2017-03-29 MED ORDER — SODIUM CHLORIDE 0.9 % IV SOLN
100.0000 mg/m2 | Freq: Once | INTRAVENOUS | Status: AC
  Administered 2017-03-29: 170 mg via INTRAVENOUS
  Filled 2017-03-29: qty 8.5

## 2017-03-29 MED ORDER — DEXAMETHASONE SODIUM PHOSPHATE 10 MG/ML IJ SOLN
10.0000 mg | Freq: Once | INTRAMUSCULAR | Status: AC
Start: 2017-03-29 — End: 2017-03-29
  Administered 2017-03-29: 10 mg via INTRAVENOUS
  Filled 2017-03-29: qty 1

## 2017-03-29 MED ORDER — SODIUM CHLORIDE 0.9 % IV SOLN
Freq: Once | INTRAVENOUS | Status: AC
  Administered 2017-03-29: 09:00:00 via INTRAVENOUS
  Filled 2017-03-29: qty 1000

## 2017-03-29 NOTE — Telephone Encounter (Signed)
OK to give verbals for PT?

## 2017-03-29 NOTE — Telephone Encounter (Signed)
Ok

## 2017-03-29 NOTE — Telephone Encounter (Signed)
Called spoke to [facility] verbal ok given for PT 1x week for two weeks.

## 2017-03-29 NOTE — Telephone Encounter (Signed)
Can you call them

## 2017-03-29 NOTE — Telephone Encounter (Signed)
Copied from [place] [id number]. Topic: Quick Communication - See Telephone Encounter >> Mar 29, 2017  8:58 AM Burnis Medin, NT wrote: CRM for notification. See Telephone encounter for: Merry Proud from [facility] is calling for verbal orders for pt. PT orders for one time a week for two weeks. Merry Proud would like a call back  03/29/17.

## 2017-03-29 NOTE — Telephone Encounter (Signed)
Spoke with pt in reference to myrbetriq samples or ditropan. Pt stated that her frequency has calmed down at the moment and she is able to sit through her chemo tx without having to go to the bathroom. Reinforced with pt that if her s/s worsen to give [name] a call and we will give her some samples. Pt voiced understanding.

## 2017-03-30 ENCOUNTER — Inpatient Hospital Stay: Payer: Medicare Other

## 2017-03-30 DIAGNOSIS — C679 Malignant neoplasm of bladder, unspecified: Secondary | ICD-10-CM

## 2017-03-30 DIAGNOSIS — Z5111 Encounter for antineoplastic chemotherapy: Secondary | ICD-10-CM | POA: Diagnosis not present

## 2017-03-30 DIAGNOSIS — E871 Hypo-osmolality and hyponatremia: Secondary | ICD-10-CM | POA: Diagnosis not present

## 2017-03-30 DIAGNOSIS — Z7689 Persons encountering health services in other specified circumstances: Secondary | ICD-10-CM | POA: Diagnosis not present

## 2017-03-30 DIAGNOSIS — R509 Fever, unspecified: Secondary | ICD-10-CM | POA: Diagnosis not present

## 2017-03-30 DIAGNOSIS — E878 Other disorders of electrolyte and fluid balance, not elsewhere classified: Secondary | ICD-10-CM | POA: Diagnosis not present

## 2017-03-30 MED ORDER — PEGFILGRASTIM INJECTION 6 MG/0.6ML ~~LOC~~
6.0000 mg | PREFILLED_SYRINGE | Freq: Once | SUBCUTANEOUS | Status: AC
  Administered 2017-03-30: 6 mg via SUBCUTANEOUS

## 2017-04-02 ENCOUNTER — Inpatient Hospital Stay: Payer: Medicare Other

## 2017-04-02 DIAGNOSIS — R509 Fever, unspecified: Secondary | ICD-10-CM | POA: Diagnosis not present

## 2017-04-02 DIAGNOSIS — E871 Hypo-osmolality and hyponatremia: Secondary | ICD-10-CM | POA: Diagnosis not present

## 2017-04-02 DIAGNOSIS — C679 Malignant neoplasm of bladder, unspecified: Secondary | ICD-10-CM

## 2017-04-02 DIAGNOSIS — E878 Other disorders of electrolyte and fluid balance, not elsewhere classified: Secondary | ICD-10-CM | POA: Diagnosis not present

## 2017-04-02 DIAGNOSIS — Z7689 Persons encountering health services in other specified circumstances: Secondary | ICD-10-CM | POA: Diagnosis not present

## 2017-04-02 DIAGNOSIS — Z5111 Encounter for antineoplastic chemotherapy: Secondary | ICD-10-CM | POA: Diagnosis not present

## 2017-04-02 LAB — CBC WITH DIFFERENTIAL/PLATELET
BASOS ABS: 0.3 10*3/uL — AB (ref 0–0.1)
Basophils Relative: 1 %
EOS ABS: 0 10*3/uL (ref 0–0.7)
EOS PCT: 0 %
HCT: 35.9 % (ref 35.0–47.0)
Hemoglobin: 12 g/dL (ref 12.0–16.0)
LYMPHS ABS: 1.7 10*3/uL (ref 1.0–3.6)
Lymphocytes Relative: 5 %
MCH: 30.5 pg (ref 26.0–34.0)
MCHC: 33.3 g/dL (ref 32.0–36.0)
MCV: 91.6 fL (ref 80.0–100.0)
MONO ABS: 0.3 10*3/uL (ref 0.2–0.9)
Monocytes Relative: 1 %
NEUTROS PCT: 93 %
Neutro Abs: 31.6 10*3/uL — ABNORMAL HIGH (ref 1.4–6.5)
PLATELETS: 590 10*3/uL — AB (ref 150–440)
RBC: 3.92 MIL/uL (ref 3.80–5.20)
RDW: 14.3 % (ref 11.5–14.5)
WBC: 33.9 10*3/uL — AB (ref 3.6–11.0)

## 2017-04-02 LAB — COMPREHENSIVE METABOLIC PANEL
ALBUMIN: 3.4 g/dL — AB (ref 3.5–5.0)
ALT: 14 U/L (ref 14–54)
ANION GAP: 10 (ref 5–15)
AST: 18 U/L (ref 15–41)
Alkaline Phosphatase: 100 U/L (ref 38–126)
BILIRUBIN TOTAL: 0.9 mg/dL (ref 0.3–1.2)
BUN: 17 mg/dL (ref 6–20)
CALCIUM: 9.3 mg/dL (ref 8.9–10.3)
CO2: 28 mmol/L (ref 22–32)
Chloride: 99 mmol/L — ABNORMAL LOW (ref 101–111)
Creatinine, Ser: 0.91 mg/dL (ref 0.44–1.00)
GFR calc non Af Amer: 58 mL/min — ABNORMAL LOW (ref >60)
GLUCOSE: 203 mg/dL — AB (ref 65–99)
POTASSIUM: 3.9 mmol/L (ref 3.5–5.1)
SODIUM: 137 mmol/L (ref 135–145)
TOTAL PROTEIN: 6.8 g/dL (ref 6.5–8.1)

## 2017-04-02 NOTE — Telephone Encounter (Signed)
Pt left message stating she would like to pick up samples. LMOM notifying pt that she may pick up samples of Myrbetriq 25mg  per [facility].

## 2017-04-04 DIAGNOSIS — C679 Malignant neoplasm of bladder, unspecified: Secondary | ICD-10-CM | POA: Diagnosis not present

## 2017-04-04 DIAGNOSIS — J449 Chronic obstructive pulmonary disease, unspecified: Secondary | ICD-10-CM | POA: Diagnosis not present

## 2017-04-04 DIAGNOSIS — K551 Chronic vascular disorders of intestine: Secondary | ICD-10-CM | POA: Diagnosis not present

## 2017-04-04 DIAGNOSIS — I2510 Atherosclerotic heart disease of native coronary artery without angina pectoris: Secondary | ICD-10-CM | POA: Diagnosis not present

## 2017-04-04 DIAGNOSIS — I10 Essential (primary) hypertension: Secondary | ICD-10-CM | POA: Diagnosis not present

## 2017-04-04 DIAGNOSIS — Z483 Aftercare following surgery for neoplasm: Secondary | ICD-10-CM | POA: Diagnosis not present

## 2017-04-06 DIAGNOSIS — I2510 Atherosclerotic heart disease of native coronary artery without angina pectoris: Secondary | ICD-10-CM | POA: Diagnosis not present

## 2017-04-06 DIAGNOSIS — J449 Chronic obstructive pulmonary disease, unspecified: Secondary | ICD-10-CM | POA: Diagnosis not present

## 2017-04-06 DIAGNOSIS — C679 Malignant neoplasm of bladder, unspecified: Secondary | ICD-10-CM | POA: Diagnosis not present

## 2017-04-06 DIAGNOSIS — I10 Essential (primary) hypertension: Secondary | ICD-10-CM | POA: Diagnosis not present

## 2017-04-06 DIAGNOSIS — Z483 Aftercare following surgery for neoplasm: Secondary | ICD-10-CM | POA: Diagnosis not present

## 2017-04-06 DIAGNOSIS — K551 Chronic vascular disorders of intestine: Secondary | ICD-10-CM | POA: Diagnosis not present

## 2017-04-16 NOTE — Progress Notes (Signed)
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 She has had jak 2 testing as well as EPO levels in the past which was unremarkable. She gets a phlebotomy when her hematocrit is more than 50. She was last seen by me in June 2018. She also has a history of lung nodules and was getting low-dose lung cancer screening scans on 07/04/2014 when her insurance stopped paying.   She recently presented to the hospital in October 2018 with symptoms of nausea and diarrhea weight loss and fatigue. She was noted to have irregular wall thickening in the ascending colon suspicious for colon cancer as well as 4.2 x 3.2 cm posterior bladder wall mass that was incidentally noted. She was also seen by vascular surgery and underwent abdominal angiogram with stent placement at the [facility]. She is on aspirin and Plavix for the same. Patient underwent colonoscopy for concerns of ischemic colitis which showed irregular thickening of the descending colon consistent with ischemic colitis. EGD showed a small hiatal hernia. Losing gastric ulcers were noted which was treated with argon photocoagulation.  With regards to the posterior bladder wall mass she was seen by Dr. Erlene Quan and underwent TURBT with bilateral ureteral stent placement on 02/13/2017. Pathology showed small cell carcinoma with muscularis propria invasion   Interval history-she is currently tolerating her  chemotherapy well and reports no fatigue or nausea vomiting.  She continues to have urinary frequency for which she was prescribed Myrbetriq by urology and says that this has helped her.  She was able to go for line dancing over the last few days without any issues.  ECOG PS- 1 Pain scale- 0 Opioid associated constipation- no  Review of systems- Review of Systems  Constitutional: Positive for malaise/fatigue. Negative for chills, fever and weight loss.  HENT: Negative for congestion, ear discharge and nosebleeds.   Eyes: Negative for blurred vision.  Respiratory: Negative for cough, hemoptysis, sputum production, shortness of breath and wheezing.   Cardiovascular: Negative for chest pain, palpitations, orthopnea and claudication.  Gastrointestinal: Negative for abdominal pain, blood in stool, constipation, diarrhea, heartburn, melena, nausea and vomiting.  Genitourinary: Negative for dysuria, flank pain, frequency, hematuria and urgency.  Musculoskeletal: Negative for back pain, joint pain and myalgias.  Skin: Negative for rash.  Neurological: Negative for dizziness, tingling, focal weakness, seizures, weakness and headaches.  Endo/Heme/Allergies: Does not bruise/bleed easily.  Psychiatric/Behavioral: Negative for depression and suicidal ideas. The patient does not have insomnia.       Allergies  Allergen Reactions  . Evista [Raloxifene] Other (See Comments)    Night sweats   . Fluticasone-Salmeterol Other (See Comments)    Cough, "chokes me"      Past Medical History:  Diagnosis Date  . Anemia   . Asthma   . Bladder cancer ([place])   . BRCA positive   . CAD (coronary artery disease)   . COPD (chronic obstructive pulmonary disease) ([place])   . Emphysema lung ([place])   . GERD (gastroesophageal reflux disease)   .  History of colon polyps   . Hypercholesterolemia   . Hyperglycemia   . Hypertension   . Lung nodules   . Osteoporosis   . Personal history of tobacco use, presenting  hazards to health 11/25/2014  . Polycythemia vera(238.4)   . Renal cyst   . Skin cancer      Past Surgical History:  Procedure Laterality Date  . ANGIOPLASTY     coronary (x1)  . COLONOSCOPY WITH PROPOFOL N/A 03/02/2015   Procedure: COLONOSCOPY WITH PROPOFOL;  Surgeon: Lollie Sails, MD;  Location: [facility] ENDOSCOPY;  Service: Endoscopy;  Laterality: N/A;  . COLONOSCOPY WITH PROPOFOL N/A 02/13/2017   Procedure: COLONOSCOPY WITH PROPOFOL;  Surgeon: [name], MD;  Location: [facility] ENDOSCOPY;  Service: Endoscopy;  Laterality: N/A;  . CORONARY ANGIOPLASTY    . CORONARY ARTERY BYPASS GRAFT    . CYSTOSCOPY W/ RETROGRADES Bilateral 02/13/2017   Procedure: CYSTOSCOPY WITH RETROGRADE PYELOGRAM;  Surgeon: Hollice Espy, MD;  Location: [facility] ORS;  Service: Urology;  Laterality: Bilateral;  . CYSTOSCOPY WITH BIOPSY  03/26/2017   Procedure: CYSTOSCOPY WITH BIOPSY;  Surgeon: Hollice Espy, MD;  Location: [facility] ORS;  Service: Urology;;  . Consuela Mimes WITH STENT PLACEMENT Bilateral 02/13/2017   Procedure: CYSTOSCOPY WITH STENT PLACEMENT;  Surgeon: Hollice Espy, MD;  Location: [facility] ORS;  Service: Urology;  Laterality: Bilateral;  . CYSTOSCOPY WITH STENT PLACEMENT Bilateral 03/26/2017   Procedure: CYSTOSCOPY WITH STENT EXCHANGE;  Surgeon: Hollice Espy, MD;  Location: [facility] ORS;  Service: Urology;  Laterality: Bilateral;  . ESOPHAGOGASTRODUODENOSCOPY (EGD) WITH PROPOFOL N/A 02/13/2017   Procedure: ESOPHAGOGASTRODUODENOSCOPY (EGD) WITH PROPOFOL;  Surgeon: [name], MD;  Location[facility] ENDOSCOPY;  Service: Endoscopy;  Laterality: N/A;  . PORTA CATH INSERTION N/A 02/28/2017   Procedure: PORTA CATH INSERTION;  Surgeon: Algernon Huxley, MD;  Location: [place] CV LAB;  Service: Cardiovascular;  Laterality: N/A;  . SALPINGOOPHORECTOMY    . TONSILECTOMY/ADENOIDECTOMY WITH MYRINGOTOMY    . TRANSURETHRAL RESECTION OF BLADDER TUMOR N/A 02/13/2017   Procedure: TRANSURETHRAL RESECTION OF BLADDER TUMOR  (TURBT);  Surgeon: Hollice Espy, MD;  Location: [facility] ORS;  Service: Urology;  Laterality: N/A;  . TUBAL LIGATION    . UPPER GI ENDOSCOPY  02/13/2017  . VISCERAL ARTERY INTERVENTION N/A 02/12/2017   Procedure: VISCERAL ARTERY INTERVENTION;  Surgeon: Algernon Huxley, MD;  Location: [place] CV LAB;  Service: Cardiovascular;  Laterality: N/A;    Social History   Socioeconomic History  . Marital status: Single    Spouse name: Not on file  . Number of children: 2  . Years of education: Not on file  . Highest education level: Not on file  Social Needs  . Financial resource strain: Not on file  . Food insecurity - worry: Not on file  . Food insecurity - inability: Not on file  . Transportation needs - medical: Not on file  . Transportation needs - non-medical: Not on file  Occupational History  . Not on file  Tobacco Use  . Smoking status: Former Smoker    Packs/day: 1.00    Years: 45.00    Pack years: 45.00    Types: Cigarettes    Last attempt to quit: 11/11/2003    Years since quitting: 13.4  . Smokeless tobacco: Never Used  Substance and Sexual Activity  . Alcohol use: No    Alcohol/week: 0.0 oz    Comment: occasional  . Drug use: No  . Sexual activity: No  Other Topics Concern  . Not on file  Social History Narrative  . Not on file    Family History  Problem Relation Age of Onset  . Cirrhosis Father        died age 35  . Alcohol abuse Father   . Asthma Mother   . Congestive Heart Failure Mother   . Breast cancer Mother        2 (1/2 sisters)  . Osteoarthritis Mother   . Colon cancer Mother   . Lupus Sister   . Alcohol abuse Sister   . Ovarian cancer Sister   . Osteoporosis Sister   . Skin cancer Sister   . Breast cancer Cousin   . Breast cancer Maternal Aunt      Current Outpatient Medications:  .  albuterol (PROVENTIL HFA;VENTOLIN HFA) 108 (90 Base) MCG/ACT inhaler, Inhale 2 puffs into the lungs every 6 (six) hours as needed for wheezing or shortness  of breath., Disp: 1 Inhaler, Rfl: 2 .  amLODipine (NORVASC) 5 MG tablet, TAKE 1 TABLET BY MOUTH  DAILY, Disp: 90 tablet, Rfl: 1 .  aspirin EC 81 MG EC tablet, Take 1 tablet (81 mg total) by mouth daily., Disp: 30 tablet, Rfl: 1 .  CALCIUM CARBONATE PO, Take 1 tablet by mouth every Monday, Wednesday, and Friday., Disp: , Rfl:  .  cholecalciferol (VITAMIN D) 1000 units tablet, Take 1,000 Units by mouth daily., Disp: , Rfl:  .  clopidogrel (PLAVIX) 75 MG tablet, Take 1 tablet (75 mg total) by mouth daily., Disp: 30 tablet, Rfl: 2 .  fluticasone (FLONASE) 50 MCG/ACT nasal spray, Place 2 sprays into both nostrils daily. (Patient taking differently: Place 2 sprays into both nostrils daily as needed for allergies (congestion). ), Disp: 16 g, Rfl: 5 .  ipratropium-albuterol (DUONEB) 0.5-2.5 (3) MG/3ML SOLN, Take 3 mLs by nebulization every 6 (six) hours as needed (for shortness of breath)., Disp: 36 mL, Rfl: 0 .  levofloxacin (LEVAQUIN) 500 MG tablet, Take 1 tablet (500 mg total) by mouth daily., Disp: 5 tablet, Rfl: 0 .  lidocaine-prilocaine (EMLA) cream, Apply to affected area once (Patient taking differently: Apply 1 application topically daily as needed. Prior to port access), Disp: 30 g, Rfl: 3 .  LORazepam (ATIVAN) 0.5 MG tablet, Take 1 tablet (0.5 mg total) by mouth every 6 (six) hours as needed (Nausea or vomiting)., Disp: 30 tablet, Rfl: 0 .  losartan (COZAAR) 100 MG tablet, TAKE 1 TABLET BY MOUTH  DAILY (Patient taking differently: TAKE 139m BY MOUTH  DAILY), Disp: 90 tablet, Rfl: 1 .  lovastatin (MEVACOR) 40 MG tablet, TAKE 1 TABLET BY MOUTH  DAILY (Patient taking differently: TAKE 450mBY MOUTH  DAILY), Disp: 90 tablet, Rfl: 1 .  metoprolol tartrate (LOPRESSOR) 25 MG tablet, TAKE ONE-HALF TABLET BY  MOUTH TWO TIMES DAILY (Patient taking differently: TAKE 12.51m54mY MOUTH TWO TIMES DAILY), Disp: 90 tablet, Rfl: 1 .  ondansetron (ZOFRAN) 8 MG tablet, Take 1 tablet (8 mg total) by mouth 2 (two) times  daily as needed for refractory nausea / vomiting. Start on day 3 after carboplatin chemo., Disp: 30 tablet, Rfl: 1 .  oxybutynin (DITROPAN) 5 MG tablet, Take 1 tablet (5 mg total) by mouth every 8 (eight) hours as needed for bladder spasms., Disp: 30 tablet, Rfl: 0 .  oxyCODONE-acetaminophen (PERCOCET/ROXICET) 5-325 MG tablet, Take 1-2 tablets by mouth every 6 (six) hours as needed for moderate pain., Disp: 30 tablet, Rfl: 0 .  pantoprazole (PROTONIX) 40 MG tablet, Take 1 tablet (40 mg total) by mouth  daily., Disp: 30 tablet, Rfl: 1 .  prochlorperazine (COMPAZINE) 10 MG tablet, Take 1 tablet (10 mg total) by mouth every 6 (six) hours as needed (Nausea or vomiting)., Disp: 30 tablet, Rfl: 1 .  SYMBICORT 160-4.5 MCG/ACT inhaler, USE 2 PUFFS TWO TIMES DAILY, Disp: 30.6 g, Rfl: 1  Physical exam:  Vitals:   04/17/17 0925 04/17/17 0929  BP: (!) 179/71 (!) 168/67  Pulse: 74   Resp: 18   Temp: 98.4 F (36.9 C)   TempSrc: Tympanic   Weight: 132 lb (59.9 kg)    Physical Exam  Constitutional: She is oriented to person, place, and time.  Thin elderly female in no acute distress  HENT:  Head: Normocephalic and atraumatic.  Eyes: EOM are normal. Pupils are equal, round, and reactive to light.  Neck: Normal range of motion.  Cardiovascular: Normal rate, regular rhythm and normal heart sounds.  Pulmonary/Chest: Effort normal and breath sounds normal.  Abdominal: Soft. Bowel sounds are normal.  Musculoskeletal: She exhibits edema (Bilateral +1).  Neurological: She is alert and oriented to person, place, and time.  Skin: Skin is warm and dry.     CMP Latest Ref Rng & Units 04/17/2017  Glucose 65 - 99 mg/dL 198(H)  BUN 6 - 20 mg/dL 17  Creatinine 0.44 - 1.00 mg/dL 0.82  Sodium 135 - 145 mmol/L 139  Potassium 3.5 - 5.1 mmol/L 3.3(L)  Chloride 101 - 111 mmol/L 108  CO2 22 - 32 mmol/L 23  Calcium 8.9 - 10.3 mg/dL 8.8(L)  Total Protein 6.5 - 8.1 g/dL 6.4(L)  Total Bilirubin 0.3 - 1.2 mg/dL 0.4    Alkaline Phos 38 - 126 U/L 75  AST 15 - 41 U/L 21  ALT 14 - 54 U/L 11(L)   CBC Latest Ref Rng & Units 04/17/2017  WBC 3.6 - 11.0 K/uL 13.3(H)  Hemoglobin 12.0 - 16.0 g/dL 11.7(L)  Hematocrit 35.0 - 47.0 % 34.8(L)  Platelets 150 - 440 K/uL 286    Assessment and plan- Patient is a 82 y.o. 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 Etoposide only day 2 tomorrow and day 3. She will get neulasta on day 4 instead of onpro. Plan to repeat scans after 4 cycles which I will order today  She will be seen by 1 of my covering partners in 3 weeks time on 05/09/2017 prior to cycle #4 of chemotherapy.  She will be receiving Neulasta on Saturday 12/29  Urinary frequency/ urgency-continue Myrbetriq  I will see her 5 weeks from now after her CT scans  Mild chemo induced anemia: Continue to monitor   Visit Diagnosis 1. Small cell carcinoma of bladder ([place])   2. Encounter for antineoplastic chemotherapy      Dr. Randa Evens, MD, MPH [facility] at [facility] Pager- [phone] 04/17/2017 12:45 PM

## 2017-04-17 ENCOUNTER — Inpatient Hospital Stay: Payer: Medicare Other

## 2017-04-17 ENCOUNTER — Inpatient Hospital Stay: Payer: Medicare Other | Attending: Oncology | Admitting: Oncology

## 2017-04-17 ENCOUNTER — Encounter: Payer: Self-pay | Admitting: Oncology

## 2017-04-17 VITALS — BP 168/67 | HR 74 | Temp 98.4°F | Resp 18 | Wt 132.0 lb

## 2017-04-17 DIAGNOSIS — D751 Secondary polycythemia: Secondary | ICD-10-CM | POA: Insufficient documentation

## 2017-04-17 DIAGNOSIS — J449 Chronic obstructive pulmonary disease, unspecified: Secondary | ICD-10-CM | POA: Diagnosis not present

## 2017-04-17 DIAGNOSIS — R739 Hyperglycemia, unspecified: Secondary | ICD-10-CM | POA: Diagnosis not present

## 2017-04-17 DIAGNOSIS — Z7689 Persons encountering health services in other specified circumstances: Secondary | ICD-10-CM | POA: Diagnosis not present

## 2017-04-17 DIAGNOSIS — C679 Malignant neoplasm of bladder, unspecified: Secondary | ICD-10-CM

## 2017-04-17 DIAGNOSIS — K219 Gastro-esophageal reflux disease without esophagitis: Secondary | ICD-10-CM | POA: Insufficient documentation

## 2017-04-17 DIAGNOSIS — Z5111 Encounter for antineoplastic chemotherapy: Secondary | ICD-10-CM | POA: Insufficient documentation

## 2017-04-17 DIAGNOSIS — Z87891 Personal history of nicotine dependence: Secondary | ICD-10-CM | POA: Diagnosis not present

## 2017-04-17 DIAGNOSIS — M810 Age-related osteoporosis without current pathological fracture: Secondary | ICD-10-CM | POA: Diagnosis not present

## 2017-04-17 DIAGNOSIS — Z79899 Other long term (current) drug therapy: Secondary | ICD-10-CM | POA: Insufficient documentation

## 2017-04-17 DIAGNOSIS — E7800 Pure hypercholesterolemia, unspecified: Secondary | ICD-10-CM | POA: Diagnosis not present

## 2017-04-17 DIAGNOSIS — Z85828 Personal history of other malignant neoplasm of skin: Secondary | ICD-10-CM | POA: Diagnosis not present

## 2017-04-17 DIAGNOSIS — D6481 Anemia due to antineoplastic chemotherapy: Secondary | ICD-10-CM | POA: Diagnosis not present

## 2017-04-17 DIAGNOSIS — R350 Frequency of micturition: Secondary | ICD-10-CM | POA: Diagnosis not present

## 2017-04-17 DIAGNOSIS — I2510 Atherosclerotic heart disease of native coronary artery without angina pectoris: Secondary | ICD-10-CM | POA: Diagnosis not present

## 2017-04-17 DIAGNOSIS — Z86010 Personal history of colonic polyps: Secondary | ICD-10-CM | POA: Insufficient documentation

## 2017-04-17 DIAGNOSIS — I10 Essential (primary) hypertension: Secondary | ICD-10-CM | POA: Diagnosis not present

## 2017-04-17 DIAGNOSIS — K449 Diaphragmatic hernia without obstruction or gangrene: Secondary | ICD-10-CM | POA: Insufficient documentation

## 2017-04-17 DIAGNOSIS — Z7982 Long term (current) use of aspirin: Secondary | ICD-10-CM | POA: Diagnosis not present

## 2017-04-17 LAB — CBC WITH DIFFERENTIAL/PLATELET
BASOS ABS: 0.1 10*3/uL (ref 0–0.1)
Basophils Relative: 1 %
Eosinophils Absolute: 0.1 10*3/uL (ref 0–0.7)
Eosinophils Relative: 1 %
HEMATOCRIT: 34.8 % — AB (ref 35.0–47.0)
HEMOGLOBIN: 11.7 g/dL — AB (ref 12.0–16.0)
LYMPHS PCT: 16 %
Lymphs Abs: 2.1 10*3/uL (ref 1.0–3.6)
MCH: 30.7 pg (ref 26.0–34.0)
MCHC: 33.6 g/dL (ref 32.0–36.0)
MCV: 91.5 fL (ref 80.0–100.0)
MONO ABS: 0.8 10*3/uL (ref 0.2–0.9)
Monocytes Relative: 6 %
NEUTROS ABS: 10.2 10*3/uL — AB (ref 1.4–6.5)
NEUTROS PCT: 76 %
Platelets: 286 10*3/uL (ref 150–440)
RBC: 3.8 MIL/uL (ref 3.80–5.20)
RDW: 16 % — ABNORMAL HIGH (ref 11.5–14.5)
WBC: 13.3 10*3/uL — AB (ref 3.6–11.0)

## 2017-04-17 LAB — COMPREHENSIVE METABOLIC PANEL
ALBUMIN: 3.3 g/dL — AB (ref 3.5–5.0)
ALT: 11 U/L — ABNORMAL LOW (ref 14–54)
ANION GAP: 8 (ref 5–15)
AST: 21 U/L (ref 15–41)
Alkaline Phosphatase: 75 U/L (ref 38–126)
BILIRUBIN TOTAL: 0.4 mg/dL (ref 0.3–1.2)
BUN: 17 mg/dL (ref 6–20)
CO2: 23 mmol/L (ref 22–32)
Calcium: 8.8 mg/dL — ABNORMAL LOW (ref 8.9–10.3)
Chloride: 108 mmol/L (ref 101–111)
Creatinine, Ser: 0.82 mg/dL (ref 0.44–1.00)
GFR calc Af Amer: >60 mL/min (ref >60)
GFR calc non Af Amer: >60 mL/min (ref >60)
GLUCOSE: 198 mg/dL — AB (ref 65–99)
POTASSIUM: 3.3 mmol/L — AB (ref 3.5–5.1)
SODIUM: 139 mmol/L (ref 135–145)
TOTAL PROTEIN: 6.4 g/dL — AB (ref 6.5–8.1)

## 2017-04-17 MED ORDER — PALONOSETRON HCL INJECTION 0.25 MG/5ML
0.2500 mg | Freq: Once | INTRAVENOUS | Status: AC
  Administered 2017-04-17: 0.25 mg via INTRAVENOUS
  Filled 2017-04-17: qty 5

## 2017-04-17 MED ORDER — SODIUM CHLORIDE 0.9 % IV SOLN
100.0000 mg/m2 | Freq: Once | INTRAVENOUS | Status: AC
  Administered 2017-04-17: 170 mg via INTRAVENOUS
  Filled 2017-04-17: qty 8.5

## 2017-04-17 MED ORDER — HEPARIN SOD (PORK) LOCK FLUSH 100 UNIT/ML IV SOLN
500.0000 [IU] | Freq: Once | INTRAVENOUS | Status: AC
  Administered 2017-04-17: 500 [IU] via INTRAVENOUS
  Filled 2017-04-17: qty 5

## 2017-04-17 MED ORDER — SODIUM CHLORIDE 0.9 % IV SOLN
260.0000 mg | Freq: Once | INTRAVENOUS | Status: AC
  Administered 2017-04-17: 260 mg via INTRAVENOUS
  Filled 2017-04-17: qty 26

## 2017-04-17 MED ORDER — SODIUM CHLORIDE 0.9% FLUSH
10.0000 mL | Freq: Once | INTRAVENOUS | Status: AC
  Administered 2017-04-17: 10 mL via INTRAVENOUS
  Filled 2017-04-17: qty 10

## 2017-04-17 MED ORDER — HEPARIN SOD (PORK) LOCK FLUSH 100 UNIT/ML IV SOLN: 500.0000 [IU] | Freq: Once | INTRAVENOUS | Status: DC | PRN

## 2017-04-17 MED ORDER — MIRABEGRON ER 25 MG PO TB24: 25.0000 mg | ORAL_TABLET | Freq: Every day | ORAL | 0 refills | Status: DC

## 2017-04-17 MED ORDER — DEXAMETHASONE SODIUM PHOSPHATE 10 MG/ML IJ SOLN
10.0000 mg | Freq: Once | INTRAMUSCULAR | Status: AC
  Administered 2017-04-17: 10 mg via INTRAVENOUS
  Filled 2017-04-17: qty 1

## 2017-04-17 MED ORDER — SODIUM CHLORIDE 0.9 % IV SOLN
Freq: Once | INTRAVENOUS | Status: AC
  Administered 2017-04-17: 11:00:00 via INTRAVENOUS
  Filled 2017-04-17: qty 1000

## 2017-04-17 MED ORDER — SODIUM CHLORIDE 0.9 % IV SOLN: 10.0000 mg | Freq: Once | INTRAVENOUS | Status: DC

## 2017-04-17 NOTE — Progress Notes (Signed)
Patient here for follow up with labs and treatment today. She states that she is feeling well and denies having any pain.

## 2017-04-17 NOTE — Progress Notes (Signed)
Dr.Rao is aware of Tina [name] potassium of 3.3 today. Ordered for patient to take home potassium tablets once daily. Informed patient and she verbalizes that she understands.

## 2017-04-18 ENCOUNTER — Inpatient Hospital Stay: Payer: Medicare Other

## 2017-04-18 DIAGNOSIS — K449 Diaphragmatic hernia without obstruction or gangrene: Secondary | ICD-10-CM | POA: Diagnosis not present

## 2017-04-18 DIAGNOSIS — Z7689 Persons encountering health services in other specified circumstances: Secondary | ICD-10-CM | POA: Diagnosis not present

## 2017-04-18 DIAGNOSIS — C679 Malignant neoplasm of bladder, unspecified: Secondary | ICD-10-CM | POA: Diagnosis not present

## 2017-04-18 DIAGNOSIS — Z5111 Encounter for antineoplastic chemotherapy: Secondary | ICD-10-CM | POA: Diagnosis not present

## 2017-04-18 DIAGNOSIS — D751 Secondary polycythemia: Secondary | ICD-10-CM | POA: Diagnosis not present

## 2017-04-18 DIAGNOSIS — D6481 Anemia due to antineoplastic chemotherapy: Secondary | ICD-10-CM | POA: Diagnosis not present

## 2017-04-18 MED ORDER — SODIUM CHLORIDE 0.9% FLUSH
10.0000 mL | INTRAVENOUS | Status: DC | PRN
  Administered 2017-04-18: 10 mL
  Filled 2017-04-18: qty 10

## 2017-04-18 MED ORDER — DEXAMETHASONE SODIUM PHOSPHATE 10 MG/ML IJ SOLN
10.0000 mg | Freq: Once | INTRAMUSCULAR | Status: AC
  Administered 2017-04-18: 10 mg via INTRAVENOUS
  Filled 2017-04-18: qty 1

## 2017-04-18 MED ORDER — HEPARIN SOD (PORK) LOCK FLUSH 100 UNIT/ML IV SOLN
500.0000 [IU] | Freq: Once | INTRAVENOUS | Status: AC | PRN
  Administered 2017-04-18: 500 [IU]
  Filled 2017-04-18: qty 5

## 2017-04-18 MED ORDER — SODIUM CHLORIDE 0.9 % IV SOLN
Freq: Once | INTRAVENOUS | Status: AC
Start: 2017-04-18 — End: 2017-04-18
  Administered 2017-04-18: 14:00:00 via INTRAVENOUS
  Filled 2017-04-18: qty 1000

## 2017-04-18 MED ORDER — ETOPOSIDE CHEMO INJECTION 1 GM/50ML
100.0000 mg/m2 | Freq: Once | INTRAVENOUS | Status: AC
  Administered 2017-04-18: 170 mg via INTRAVENOUS
  Filled 2017-04-18: qty 8.5

## 2017-04-19 ENCOUNTER — Inpatient Hospital Stay: Payer: Medicare Other

## 2017-04-19 VITALS — BP 174/74 | HR 70 | Temp 97.4°F | Resp 18

## 2017-04-19 DIAGNOSIS — Z5111 Encounter for antineoplastic chemotherapy: Secondary | ICD-10-CM | POA: Diagnosis not present

## 2017-04-19 DIAGNOSIS — D751 Secondary polycythemia: Secondary | ICD-10-CM | POA: Diagnosis not present

## 2017-04-19 DIAGNOSIS — K449 Diaphragmatic hernia without obstruction or gangrene: Secondary | ICD-10-CM | POA: Diagnosis not present

## 2017-04-19 DIAGNOSIS — D6481 Anemia due to antineoplastic chemotherapy: Secondary | ICD-10-CM | POA: Diagnosis not present

## 2017-04-19 DIAGNOSIS — Z7689 Persons encountering health services in other specified circumstances: Secondary | ICD-10-CM | POA: Diagnosis not present

## 2017-04-19 DIAGNOSIS — C679 Malignant neoplasm of bladder, unspecified: Secondary | ICD-10-CM | POA: Diagnosis not present

## 2017-04-19 MED ORDER — HEPARIN SOD (PORK) LOCK FLUSH 100 UNIT/ML IV SOLN
500.0000 [IU] | Freq: Once | INTRAVENOUS | Status: AC | PRN
  Administered 2017-04-19: 500 [IU]
  Filled 2017-04-19: qty 5

## 2017-04-19 MED ORDER — SODIUM CHLORIDE 0.9% FLUSH
10.0000 mL | INTRAVENOUS | Status: DC | PRN
  Filled 2017-04-19: qty 10

## 2017-04-19 MED ORDER — SODIUM CHLORIDE 0.9 % IV SOLN
100.0000 mg/m2 | Freq: Once | INTRAVENOUS | Status: AC
  Administered 2017-04-19: 170 mg via INTRAVENOUS
  Filled 2017-04-19: qty 8.5

## 2017-04-19 MED ORDER — SODIUM CHLORIDE 0.9 % IV SOLN
Freq: Once | INTRAVENOUS | Status: AC
  Administered 2017-04-19: 14:00:00 via INTRAVENOUS
  Filled 2017-04-19: qty 1000

## 2017-04-19 MED ORDER — DEXAMETHASONE SODIUM PHOSPHATE 10 MG/ML IJ SOLN
10.0000 mg | Freq: Once | INTRAMUSCULAR | Status: AC
  Administered 2017-04-19: 10 mg via INTRAVENOUS
  Filled 2017-04-19: qty 1

## 2017-04-20 ENCOUNTER — Inpatient Hospital Stay: Payer: Medicare Other

## 2017-04-20 DIAGNOSIS — D751 Secondary polycythemia: Secondary | ICD-10-CM | POA: Diagnosis not present

## 2017-04-20 DIAGNOSIS — C679 Malignant neoplasm of bladder, unspecified: Secondary | ICD-10-CM

## 2017-04-20 DIAGNOSIS — Z7689 Persons encountering health services in other specified circumstances: Secondary | ICD-10-CM | POA: Diagnosis not present

## 2017-04-20 DIAGNOSIS — Z5111 Encounter for antineoplastic chemotherapy: Secondary | ICD-10-CM | POA: Diagnosis not present

## 2017-04-20 DIAGNOSIS — D6481 Anemia due to antineoplastic chemotherapy: Secondary | ICD-10-CM | POA: Diagnosis not present

## 2017-04-20 DIAGNOSIS — K449 Diaphragmatic hernia without obstruction or gangrene: Secondary | ICD-10-CM | POA: Diagnosis not present

## 2017-04-20 MED ORDER — PEGFILGRASTIM INJECTION 6 MG/0.6ML ~~LOC~~
6.0000 mg | PREFILLED_SYRINGE | Freq: Once | SUBCUTANEOUS | Status: AC
  Administered 2017-04-20: 6 mg via SUBCUTANEOUS
  Filled 2017-04-20: qty 0.6

## 2017-04-24 ENCOUNTER — Ambulatory Visit (INDEPENDENT_AMBULATORY_CARE_PROVIDER_SITE_OTHER): Payer: Medicare Other | Admitting: Urology

## 2017-04-24 ENCOUNTER — Encounter: Payer: Self-pay | Admitting: Urology

## 2017-04-24 VITALS — BP 159/54 | HR 81 | Ht 64.0 in | Wt 126.0 lb

## 2017-04-24 DIAGNOSIS — I2510 Atherosclerotic heart disease of native coronary artery without angina pectoris: Secondary | ICD-10-CM | POA: Diagnosis not present

## 2017-04-24 DIAGNOSIS — C679 Malignant neoplasm of bladder, unspecified: Secondary | ICD-10-CM

## 2017-04-24 DIAGNOSIS — N1339 Other hydronephrosis: Secondary | ICD-10-CM

## 2017-04-24 LAB — URINALYSIS, COMPLETE
Bilirubin, UA: NEGATIVE
GLUCOSE, UA: NEGATIVE
NITRITE UA: NEGATIVE
UUROB: 0.2 mg/dL (ref 0.2–1.0)
pH, UA: 5.5 (ref 5.0–7.5)

## 2017-04-24 LAB — MICROSCOPIC EXAMINATION

## 2017-04-24 MED ORDER — CEFTRIAXONE SODIUM 1 G IJ SOLR
1.0000 g | Freq: Once | INTRAMUSCULAR | Status: AC
  Administered 2017-04-24: 1 g via INTRAMUSCULAR

## 2017-04-24 NOTE — Progress Notes (Signed)
04/24/2017 1:11 PM   Tina [name] [birth year] [id number]  Referring provider: Einar Pheasant, MD [address] [place], [place] [address]  Chief Complaint  Patient presents with  . Bladder Cancer    HPI: 82 year old female found to have an incidental bladder mass who underwent TURBT, bilateral ureteral stent placement on 02/14/2017. At that time and, the tumor was noted to be extensive, greater than 5 cm with a firm nodular mass involving the trigone and posterior bladder wall. A Foley catheter was placed at the end of the procedure due to extensive resection of the tumor. The tumor was debulked but not completely resected.  Surgical pathology is consistent with small cell carcinoma of the bladder,invasive into the muscularis propria.   Staging PET scan and brain MRI on 02/27/2017 showed no obvious metastatic disease.  She returned to the operating room on 03/26/2017 for stent exchange.  At that time, there was no obvious residual viable tumor in one area of central necrosis.  Biopsy of this area showed no viable tumor.  She returns today both to discuss  She is now status post 3 cycles of chemotherapy.  She has 1 additional cycle remaining.  She is done extremely well with these treatments.  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  She returns today both to discuss possible stent removal as well as to discuss the role of possible cystectomy moving forward.  She continues to have irritative voiding symptoms felt to be related to her stents.  She is taking Myrbetriq which has been helpful.  She is anxious to have her stents removed today for possible.   PMH: Past Medical History:  Diagnosis Date  . Anemia   . Asthma   . Bladder cancer ([place])   . BRCA positive   . CAD (coronary artery disease)   . COPD (chronic obstructive pulmonary disease) ([place])   . Emphysema lung ([place])   . GERD (gastroesophageal reflux disease)   . History of colon polyps   .  Hypercholesterolemia   . Hyperglycemia   . Hypertension   . Lung nodules   . Osteoporosis   . Personal history of tobacco use, presenting hazards to health 11/25/2014  . Polycythemia vera(238.4)   . Renal cyst   . Skin cancer     Surgical History: Past Surgical History:  Procedure Laterality Date  . ANGIOPLASTY     coronary (x1)  . COLONOSCOPY WITH PROPOFOL N/A 03/02/2015   Procedure: COLONOSCOPY WITH PROPOFOL;  Surgeon: Lollie Sails, MD;  Location: [facility] ENDOSCOPY;  Service: Endoscopy;  Laterality: N/A;  . COLONOSCOPY WITH PROPOFOL N/A 02/13/2017   Procedure: COLONOSCOPY WITH PROPOFOL;  Surgeon: [name], MD;  Location: [facility] ENDOSCOPY;  Service: Endoscopy;  Laterality: N/A;  . CORONARY ANGIOPLASTY    . CORONARY ARTERY BYPASS GRAFT    . CYSTOSCOPY W/ RETROGRADES Bilateral 02/13/2017   Procedure: CYSTOSCOPY WITH RETROGRADE PYELOGRAM;  Surgeon: Hollice Espy, MD;  Location: [facility] ORS;  Service: Urology;  Laterality: Bilateral;  . CYSTOSCOPY WITH BIOPSY  03/26/2017   Procedure: CYSTOSCOPY WITH BIOPSY;  Surgeon: Hollice Espy, MD;  Location: [facility] ORS;  Service: Urology;;  . Consuela Mimes WITH STENT PLACEMENT Bilateral 02/13/2017   Procedure: CYSTOSCOPY WITH STENT PLACEMENT;  Surgeon: Hollice Espy, MD;  Location: [facility] ORS;  Service: Urology;  Laterality: Bilateral;  . CYSTOSCOPY WITH STENT PLACEMENT Bilateral 03/26/2017   Procedure: CYSTOSCOPY WITH STENT EXCHANGE;  Surgeon: Hollice Espy, MD;  Location: [facility] ORS;  Service: Urology;  Laterality: Bilateral;  .  ESOPHAGOGASTRODUODENOSCOPY (EGD) WITH PROPOFOL N/A 02/13/2017   Procedure: ESOPHAGOGASTRODUODENOSCOPY (EGD) WITH PROPOFOL;  Surgeon: [name], MD;  Location: [facility] ENDOSCOPY;  Service: Endoscopy;  Laterality: N/A;  . PORTA CATH INSERTION N/A 02/28/2017   Procedure: PORTA CATH INSERTION;  Surgeon: Algernon Huxley, MD;  Location: [place] CV LAB;  Service: Cardiovascular;  Laterality: N/A;  . SALPINGOOPHORECTOMY    .  TONSILECTOMY/ADENOIDECTOMY WITH MYRINGOTOMY    . TRANSURETHRAL RESECTION OF BLADDER TUMOR N/A 02/13/2017   Procedure: TRANSURETHRAL RESECTION OF BLADDER TUMOR (TURBT);  Surgeon: Hollice Espy, MD;  Location: [facility] ORS;  Service: Urology;  Laterality: N/A;  . TUBAL LIGATION    . UPPER GI ENDOSCOPY  02/13/2017  . VISCERAL ARTERY INTERVENTION N/A 02/12/2017   Procedure: VISCERAL ARTERY INTERVENTION;  Surgeon: Algernon Huxley, MD;  Location: [place] [facility] LAB;  Service: Cardiovascular;  Laterality: N/A;    Home Medications:  Allergies as of 04/24/2017      Reactions   Evista [raloxifene] Other (See Comments)   Night sweats    Fluticasone-salmeterol Other (See Comments)   Cough, "chokes me"       Medication List        Accurate as of 04/24/17  1:11 PM. Always use your most recent med list.          albuterol 108 (90 Base) MCG/ACT inhaler Commonly known as:  PROVENTIL HFA;VENTOLIN HFA Inhale 2 puffs into the lungs every 6 (six) hours as needed for wheezing or shortness of breath.   amLODipine 5 MG tablet Commonly known as:  NORVASC TAKE 1 TABLET BY MOUTH  DAILY   aspirin 81 MG EC tablet Take 1 tablet (81 mg total) by mouth daily.   CALCIUM CARBONATE PO Take 1 tablet by mouth every Monday, Wednesday, and Friday.   cholecalciferol 1000 units tablet Commonly known as:  VITAMIN D Take 1,000 Units by mouth daily.   clopidogrel 75 MG tablet Commonly known as:  PLAVIX Take 1 tablet (75 mg total) by mouth daily.   fluticasone 50 MCG/ACT nasal spray Commonly known as:  FLONASE Place 2 sprays into both nostrils daily.   ipratropium-albuterol 0.5-2.5 (3) MG/3ML Soln Commonly known as:  DUONEB Take 3 mLs by nebulization every 6 (six) hours as needed (for shortness of breath).   lidocaine-prilocaine cream Commonly known as:  EMLA Apply to affected area once   losartan 100 MG tablet Commonly known as:  COZAAR TAKE 1 TABLET BY MOUTH  DAILY   lovastatin 40 MG tablet Commonly  known as:  MEVACOR TAKE 1 TABLET BY MOUTH  DAILY   metoprolol tartrate 25 MG tablet Commonly known as:  LOPRESSOR TAKE ONE-HALF TABLET BY  MOUTH TWO TIMES DAILY   mirabegron ER 25 MG Tb24 tablet Commonly known as:  MYRBETRIQ Take 1 tablet (25 mg total) by mouth daily.   ondansetron 8 MG tablet Commonly known as:  ZOFRAN Take 1 tablet (8 mg total) by mouth 2 (two) times daily as needed for refractory nausea / vomiting. Start on day 3 after carboplatin chemo.   oxyCODONE-acetaminophen 5-325 MG tablet Commonly known as:  PERCOCET/ROXICET Take 1-2 tablets by mouth every 6 (six) hours as needed for moderate pain.   pantoprazole 40 MG tablet Commonly known as:  PROTONIX Take 1 tablet (40 mg total) by mouth daily.   potassium chloride 10 MEQ tablet Commonly known as:  K-DUR,KLOR-CON   prochlorperazine 10 MG tablet Commonly known as:  COMPAZINE Take 1 tablet (10 mg total) by mouth every 6 (six)  hours as needed (Nausea or vomiting).   rosuvastatin 20 MG tablet Commonly known as:  CRESTOR Take 20 mg by mouth.   SYMBICORT 160-4.5 MCG/ACT inhaler Generic drug:  budesonide-formoterol USE 2 PUFFS TWO TIMES DAILY   triamterene-hydrochlorothiazide 37.5-25 MG tablet Commonly known as:  MAXZIDE-25 Take by mouth.       Allergies:  Allergies  Allergen Reactions  . Evista [Raloxifene] Other (See Comments)    Night sweats   . Fluticasone-Salmeterol Other (See Comments)    Cough, "chokes me"     Family History: Family History  Problem Relation Age of Onset  . Cirrhosis Father        died age 49  . Alcohol abuse Father   . Asthma Mother   . Congestive Heart Failure Mother   . Breast cancer Mother        2 (1/2 sisters)  . Osteoarthritis Mother   . Colon cancer Mother   . Lupus Sister   . Alcohol abuse Sister   . Ovarian cancer Sister   . Osteoporosis Sister   . Skin cancer Sister   . Breast cancer Cousin   . Breast cancer Maternal Aunt     Social History:  reports  that she quit smoking about 13 years ago. Her smoking use included cigarettes. She has a 45.00 pack-year smoking history. she has never used smokeless tobacco. She reports that she does not drink alcohol or use drugs.  ROS: UROLOGY Frequent Urination?: Yes Hard to postpone urination?: No Burning/pain with urination?: No Get up at night to urinate?: Yes Leakage of urine?: No Urine stream starts and stops?: No Trouble starting stream?: No Do you have to strain to urinate?: No Blood in urine?: No Urinary tract infection?: No Sexually transmitted disease?: No Injury to kidneys or bladder?: No Painful intercourse?: No Weak stream?: No Currently pregnant?: No Vaginal bleeding?: No Last menstrual period?: n  Gastrointestinal Nausea?: No Vomiting?: No Indigestion/heartburn?: No Diarrhea?: No Constipation?: No  Constitutional Fever: No Night sweats?: No Weight loss?: Yes Fatigue?: No  Skin Skin rash/lesions?: No Itching?: No  Eyes Blurred vision?: No Double vision?: No  Ears/Nose/Throat Sore throat?: No Sinus problems?: No  Hematologic/Lymphatic Swollen glands?: No Easy bruising?: Yes  Cardiovascular Leg swelling?: Yes Chest pain?: No  Respiratory Cough?: No Shortness of breath?: Yes  Endocrine Excessive thirst?: No  Musculoskeletal Back pain?: No Joint pain?: No  Neurological Headaches?: No Dizziness?: No  Psychologic Depression?: No Anxiety?: No  Physical Exam: BP (!) 159/54   Pulse 81   Ht _0  (1.626 m)   Wt 126 lb (57.2 kg)   BMI 21.63 kg/m   Constitutional:  Alert and oriented, No acute distress.  Wearing wig. HEENT: [place] AT, moist mucus membranes.  Trachea midline, no masses. Cardiovascular: No clubbing, cyanosis, or edema. Respiratory: Normal respiratory effort, no increased work of breathing. GI: Abdomen is soft, nontender, nondistended, no abdominal masses GU: Normal external genitalia.  Normal urethral meatus. Skin: No rashes,  bruises or suspicious lesions. Neurologic: Grossly intact, no focal deficits, moving all 4 extremities. Psychiatric: Normal mood and affect.  Laboratory Data: Lab Results  Component Value Date   WBC 13.3 (H) 04/17/2017   HGB 11.7 (L) 04/17/2017   HCT 34.8 (L) 04/17/2017   MCV 91.5 04/17/2017   PLT 286 04/17/2017    Lab Results  Component Value Date   CREATININE 0.82 04/17/2017     Lab Results  Component Value Date   HGBA1C 5.7 (H) 06/14/2016    Urinalysis  UA reviewed, no evidence of infection  Pertinent Imaging: N/a  Cystoscopy/ Stent removal procedure  Patient identification was confirmed, informed consent was obtained, and patient was prepped using Betadine solution.  Lidocaine jelly was administered per urethral meatus.    Preoperative abx where received prior to procedure.    Procedure: - Flexible cystoscope introduced, without any difficulty.   - Thorough search of the bladder revealed:    normal urethral meatus  Stent seen emanating from right ureteral orifice, grasped with stent graspers, and removed in entirety.    The scope was then reintroduced and the same technique was used to remove the left stent.  Of note, there was some necrotic debris attached to the end of the stent and in the midline of the trigone, otherwise no actable viable tumor was identified.  Post-Procedure: - Patient tolerated the procedure well   Assessment & Plan:    1. 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 Phebe Colla for further evaluation of possible cystectomy - Urinalysis, Complete - Ambulatory referral to Urology - US RENAL; Future - cefTRIAXone (ROCEPHIN) injection 1 g  2. Other hydronephrosis Excellent response to chemotherapy without any obvious residual viable tumor in the bladder-discussed option for removal today along with risks that they would need to be replaced although suspect this is unlikely As such, discussed stent removal today These were removed without difficulty Warning symptoms reviewed Follow-up renal ultrasound in 1 week to ensure no recurrence of hydronephrosis/obstruction  Return for RUS in 1 week- will call witih results.  Hollice Espy, MD  [facility] [address], [place], [place] [address] [phone]

## 2017-05-01 ENCOUNTER — Other Ambulatory Visit: Payer: Self-pay | Admitting: *Deleted

## 2017-05-01 ENCOUNTER — Other Ambulatory Visit: Payer: Medicare Other

## 2017-05-01 ENCOUNTER — Ambulatory Visit: Payer: Medicare Other | Admitting: Oncology

## 2017-05-02 ENCOUNTER — Ambulatory Visit
Admission: RE | Admit: 2017-05-02 | Discharge: 2017-05-02 | Disposition: A | Discharge status: Home / Self Care | Payer: Medicare Other | Source: Ambulatory Visit | Attending: Urology | Admitting: Urology

## 2017-05-02 DIAGNOSIS — C679 Malignant neoplasm of bladder, unspecified: Secondary | ICD-10-CM | POA: Diagnosis not present

## 2017-05-02 DIAGNOSIS — N281 Cyst of kidney, acquired: Secondary | ICD-10-CM | POA: Insufficient documentation

## 2017-05-03 ENCOUNTER — Telehealth: Payer: Self-pay

## 2017-05-03 MED ORDER — LOVASTATIN 40 MG PO TABS: 40.0000 mg | ORAL_TABLET | Freq: Every day | ORAL | 0 refills | Status: DC

## 2017-05-03 NOTE — Telephone Encounter (Signed)
Copied from [place] [id number]. Topic: General - Other >> May 03, 2017  9:00 AM Lolita Rieger, RMA wrote: Reason for CRM:Pt called and stated that her mail order for lovastatin 40 mg will not arrive until next week and that she is out and would like a 6 day supply called in to [facility] to last her until it arrives

## 2017-05-03 NOTE — Telephone Encounter (Signed)
Medication has been sent.  

## 2017-05-04 ENCOUNTER — Telehealth: Payer: Self-pay

## 2017-05-04 NOTE — Telephone Encounter (Signed)
-----   Message from Hollice Espy, MD sent at 05/03/2017  8:43 AM EST ----- Please let this patient know her ultrasound looks great.  No need for stent replacement.  Can you please f/u with her and make sure she has an appt with Dr. Tresa Moore in [place]?  Hollice Espy, MD

## 2017-05-04 NOTE — Telephone Encounter (Signed)
Patient's daughter notified, unable to reach patient

## 2017-05-09 ENCOUNTER — Inpatient Hospital Stay: Payer: Medicare Other

## 2017-05-09 ENCOUNTER — Other Ambulatory Visit: Payer: Self-pay

## 2017-05-09 ENCOUNTER — Inpatient Hospital Stay (HOSPITAL_BASED_OUTPATIENT_CLINIC_OR_DEPARTMENT_OTHER): Payer: Medicare Other | Admitting: Internal Medicine

## 2017-05-09 VITALS — BP 179/73 | HR 73 | Temp 97.2°F | Resp 20

## 2017-05-09 DIAGNOSIS — I10 Essential (primary) hypertension: Secondary | ICD-10-CM

## 2017-05-09 DIAGNOSIS — K449 Diaphragmatic hernia without obstruction or gangrene: Secondary | ICD-10-CM | POA: Diagnosis not present

## 2017-05-09 DIAGNOSIS — J449 Chronic obstructive pulmonary disease, unspecified: Secondary | ICD-10-CM

## 2017-05-09 DIAGNOSIS — Z79899 Other long term (current) drug therapy: Secondary | ICD-10-CM

## 2017-05-09 DIAGNOSIS — Z7689 Persons encountering health services in other specified circumstances: Secondary | ICD-10-CM | POA: Diagnosis not present

## 2017-05-09 DIAGNOSIS — Z7982 Long term (current) use of aspirin: Secondary | ICD-10-CM

## 2017-05-09 DIAGNOSIS — M810 Age-related osteoporosis without current pathological fracture: Secondary | ICD-10-CM

## 2017-05-09 DIAGNOSIS — R350 Frequency of micturition: Secondary | ICD-10-CM | POA: Diagnosis not present

## 2017-05-09 DIAGNOSIS — K219 Gastro-esophageal reflux disease without esophagitis: Secondary | ICD-10-CM | POA: Diagnosis not present

## 2017-05-09 DIAGNOSIS — Z5111 Encounter for antineoplastic chemotherapy: Secondary | ICD-10-CM | POA: Diagnosis not present

## 2017-05-09 DIAGNOSIS — C679 Malignant neoplasm of bladder, unspecified: Secondary | ICD-10-CM

## 2017-05-09 DIAGNOSIS — Z87891 Personal history of nicotine dependence: Secondary | ICD-10-CM

## 2017-05-09 DIAGNOSIS — I2510 Atherosclerotic heart disease of native coronary artery without angina pectoris: Secondary | ICD-10-CM | POA: Diagnosis not present

## 2017-05-09 DIAGNOSIS — D751 Secondary polycythemia: Secondary | ICD-10-CM | POA: Diagnosis not present

## 2017-05-09 DIAGNOSIS — D6481 Anemia due to antineoplastic chemotherapy: Secondary | ICD-10-CM | POA: Diagnosis not present

## 2017-05-09 DIAGNOSIS — E7800 Pure hypercholesterolemia, unspecified: Secondary | ICD-10-CM

## 2017-05-09 DIAGNOSIS — Z85828 Personal history of other malignant neoplasm of skin: Secondary | ICD-10-CM

## 2017-05-09 DIAGNOSIS — R739 Hyperglycemia, unspecified: Secondary | ICD-10-CM | POA: Diagnosis not present

## 2017-05-09 DIAGNOSIS — Z86010 Personal history of colonic polyps: Secondary | ICD-10-CM

## 2017-05-09 LAB — COMPREHENSIVE METABOLIC PANEL
ALT: 13 U/L — ABNORMAL LOW (ref 14–54)
AST: 22 U/L (ref 15–41)
Albumin: 3.9 g/dL (ref 3.5–5.0)
Alkaline Phosphatase: 82 U/L (ref 38–126)
Anion gap: 8 (ref 5–15)
BILIRUBIN TOTAL: 0.6 mg/dL (ref 0.3–1.2)
BUN: 22 mg/dL — ABNORMAL HIGH (ref 6–20)
CHLORIDE: 107 mmol/L (ref 101–111)
CO2: 24 mmol/L (ref 22–32)
CREATININE: 0.89 mg/dL (ref 0.44–1.00)
Calcium: 9.1 mg/dL (ref 8.9–10.3)
GFR, EST NON AFRICAN AMERICAN: 60 mL/min — AB (ref >60)
Glucose, Bld: 208 mg/dL — ABNORMAL HIGH (ref 65–99)
POTASSIUM: 3.7 mmol/L (ref 3.5–5.1)
Sodium: 139 mmol/L (ref 135–145)
TOTAL PROTEIN: 6.6 g/dL (ref 6.5–8.1)

## 2017-05-09 LAB — CBC WITH DIFFERENTIAL/PLATELET
BASOS ABS: 0.1 10*3/uL (ref 0–0.1)
Basophils Relative: 0 %
EOS ABS: 0.1 10*3/uL (ref 0–0.7)
EOS PCT: 1 %
HCT: 37.7 % (ref 35.0–47.0)
HEMOGLOBIN: 12.6 g/dL (ref 12.0–16.0)
LYMPHS ABS: 1.3 10*3/uL (ref 1.0–3.6)
LYMPHS PCT: 10 %
MCH: 31.5 pg (ref 26.0–34.0)
MCHC: 33.5 g/dL (ref 32.0–36.0)
MCV: 94.1 fL (ref 80.0–100.0)
Monocytes Absolute: 0.6 10*3/uL (ref 0.2–0.9)
Monocytes Relative: 4 %
NEUTROS PCT: 85 %
Neutro Abs: 11.6 10*3/uL — ABNORMAL HIGH (ref 1.4–6.5)
PLATELETS: 269 10*3/uL (ref 150–440)
RBC: 4 MIL/uL (ref 3.80–5.20)
RDW: 18.9 % — ABNORMAL HIGH (ref 11.5–14.5)
WBC: 13.6 10*3/uL — AB (ref 3.6–11.0)

## 2017-05-09 MED ORDER — HEPARIN SOD (PORK) LOCK FLUSH 100 UNIT/ML IV SOLN
500.0000 [IU] | Freq: Once | INTRAVENOUS | Status: AC
  Administered 2017-05-09: 500 [IU] via INTRAVENOUS
  Filled 2017-05-09: qty 5

## 2017-05-09 MED ORDER — PALONOSETRON HCL INJECTION 0.25 MG/5ML
0.2500 mg | Freq: Once | INTRAVENOUS | Status: AC
  Administered 2017-05-09: 0.25 mg via INTRAVENOUS
  Filled 2017-05-09: qty 5

## 2017-05-09 MED ORDER — SODIUM CHLORIDE 0.9 % IV SOLN
260.0000 mg | Freq: Once | INTRAVENOUS | Status: AC
  Administered 2017-05-09: 260 mg via INTRAVENOUS
  Filled 2017-05-09: qty 26

## 2017-05-09 MED ORDER — SODIUM CHLORIDE 0.9% FLUSH
10.0000 mL | Freq: Once | INTRAVENOUS | Status: AC
  Administered 2017-05-09: 10 mL via INTRAVENOUS
  Filled 2017-05-09: qty 10

## 2017-05-09 MED ORDER — SODIUM CHLORIDE 0.9 % IV SOLN
Freq: Once | INTRAVENOUS | Status: AC
  Administered 2017-05-09: 10:00:00 via INTRAVENOUS
  Filled 2017-05-09: qty 1000

## 2017-05-09 MED ORDER — SODIUM CHLORIDE 0.9 % IV SOLN
100.0000 mg/m2 | Freq: Once | INTRAVENOUS | Status: AC
  Administered 2017-05-09: 170 mg via INTRAVENOUS
  Filled 2017-05-09: qty 8.5

## 2017-05-09 MED ORDER — DEXAMETHASONE SODIUM PHOSPHATE 10 MG/ML IJ SOLN
10.0000 mg | Freq: Once | INTRAMUSCULAR | Status: AC
  Administered 2017-05-09: 10 mg via INTRAVENOUS
  Filled 2017-05-09: qty 1

## 2017-05-09 MED ORDER — HEPARIN SOD (PORK) LOCK FLUSH 100 UNIT/ML IV SOLN
INTRAVENOUS | Status: AC
  Filled 2017-05-09: qty 5

## 2017-05-09 NOTE — Assessment & Plan Note (Addendum)
#   small cell carcinoma of bladder-muscle invasive/stage II-currently on "NEO-adjuvant therapy" with carbo etoposide status post cycle #3. Clinical no evidence of progression.   #Proceed with cycle #4; Labs today reviewed;  acceptable for treatment today- except for- slightly increased blood suagrs [see below]  # Growth factor-Neulasta/On pro would be given as prophylaxis for chemotherapy-induced neutropenia to prevent febrile neutropenias.  reluctant to get onpro. Plan follow up on   # Elevated Blood pressure- recommend checking at home; also pt did not take her anti- HTN pill yet.   # Elevated Blood sugars- not Diabetic- 200s; check- A1C next visit.   # awaiting appt with Dr[facility]manny at [facility];   # keep appts with Dr.rao/scans as planned. 31st early morning appt for neulasta  Cc; Dr.Rao.

## 2017-05-09 NOTE — Progress Notes (Signed)
[place] OFFICE PROGRESS NOTE  Patient Care Team: Einar Pheasant, MD as PCP - General (Internal Medicine) Clent Jacks, RN as Registered Nurse  Cancer Staging Small cell carcinoma of bladder [facility]) Staging form: Urinary Bladder, AJCC 8th Edition - Clinical stage from 03/02/2017: Stage II (cT2, cN0, cM0) - Signed by Sindy Guadeloupe, MD on 03/02/2017     No history exists.   OCT 2018- small cell carcinoma with muscularis propria invasion: posterior bladder wall mass [Dr. Brandon]  s/pTURBT with bilateral ureteral stent placement on 02/13/2017.   Patient's primary oncologist is Dr. Janese Banks; I am evaluating the patient in the absence of Dr. Janese Banks I reviewed the patient's prior charts/pertinent labs/imaging in detail.     INTERVAL HISTORY:  Tina [name] 82 y.o.  female pleasant patient above history of small cell bladder cancer-stage II currently on neoadjuvant chemotherapy with carboplatin and etoposide is here for follow-up.  Patient is awaiting to get appointment with Dr. Tresa Moore, urology at [facility] for possible cystectomy.  Patient is currently status post 3 cycles; denies any significant nausea vomiting with the chemotherapy.  No significant diarrhea.  Positive for hair loss.  No significant sores in the mouth.  Denies any chest pain or shortness of breath or cough.  REVIEW OF SYSTEMS:  A complete 10 point review of system is done which is negative except mentioned above/history of present illness.   PAST MEDICAL HISTORY :  Past Medical History:  Diagnosis Date  . Anemia   . Asthma   . Bladder cancer ([place])   . BRCA positive   . CAD (coronary artery disease)   . COPD (chronic obstructive pulmonary disease) ([place])   . Emphysema lung ([place])   . GERD (gastroesophageal reflux disease)   . History of colon polyps   . Hypercholesterolemia   . Hyperglycemia   . Hypertension   . Lung nodules   . Osteoporosis   . Personal history of tobacco use, presenting  hazards to health 11/25/2014  . Polycythemia vera(238.4)   . Renal cyst   . Skin cancer     PAST SURGICAL HISTORY :   Past Surgical History:  Procedure Laterality Date  . ANGIOPLASTY     coronary (x1)  . COLONOSCOPY WITH PROPOFOL N/A 03/02/2015   Procedure: COLONOSCOPY WITH PROPOFOL;  Surgeon: Lollie Sails, MD;  Location[facility] ENDOSCOPY;  Service: Endoscopy;  Laterality: N/A;  . COLONOSCOPY WITH PROPOFOL N/A 02/13/2017   Procedure: COLONOSCOPY WITH PROPOFOL;  Surgeon: [name], MD;  Location: [facility] ENDOSCOPY;  Service: Endoscopy;  Laterality: N/A;  . CORONARY ANGIOPLASTY    . CORONARY ARTERY BYPASS GRAFT    . CYSTOSCOPY W/ RETROGRADES Bilateral 02/13/2017   Procedure: CYSTOSCOPY WITH RETROGRADE PYELOGRAM;  Surgeon: Hollice Espy, MD;  Location: [facility] ORS;  Service: Urology;  Laterality: Bilateral;  . CYSTOSCOPY WITH BIOPSY  03/26/2017   Procedure: CYSTOSCOPY WITH BIOPSY;  Surgeon: Hollice Espy, MD;  Location: [facility] ORS;  Service: Urology;;  . Consuela Mimes WITH STENT PLACEMENT Bilateral 02/13/2017   Procedure: CYSTOSCOPY WITH STENT PLACEMENT;  Surgeon: Hollice Espy, MD;  Location: [facility] ORS;  Service: Urology;  Laterality: Bilateral;  . CYSTOSCOPY WITH STENT PLACEMENT Bilateral 03/26/2017   Procedure: CYSTOSCOPY WITH STENT EXCHANGE;  Surgeon: Hollice Espy, MD;  Location: [facility] ORS;  Service: Urology;  Laterality: Bilateral;  . ESOPHAGOGASTRODUODENOSCOPY (EGD) WITH PROPOFOL N/A 02/13/2017   Procedure: ESOPHAGOGASTRODUODENOSCOPY (EGD) WITH PROPOFOL;  Surgeon: [name], MD;  Location: [facility] ENDOSCOPY;  Service: Endoscopy;  Laterality: N/A;  .  PORTA CATH INSERTION N/A 02/28/2017   Procedure: PORTA CATH INSERTION;  Surgeon: Algernon Huxley, MD;  Location: [place] CV LAB;  Service: Cardiovascular;  Laterality: N/A;  . SALPINGOOPHORECTOMY    . TONSILECTOMY/ADENOIDECTOMY WITH MYRINGOTOMY    . TRANSURETHRAL RESECTION OF BLADDER TUMOR N/A 02/13/2017   Procedure: TRANSURETHRAL  RESECTION OF BLADDER TUMOR (TURBT);  Surgeon: Hollice Espy, MD;  Location: [facility] ORS;  Service: Urology;  Laterality: N/A;  . TUBAL LIGATION    . UPPER GI ENDOSCOPY  02/13/2017  . VISCERAL ARTERY INTERVENTION N/A 02/12/2017   Procedure: VISCERAL ARTERY INTERVENTION;  Surgeon: Algernon Huxley, MD;  Location: [place] CV LAB;  Service: Cardiovascular;  Laterality: N/A;    FAMILY HISTORY :   Family History  Problem Relation Age of Onset  . Cirrhosis Father        died age 16  . Alcohol abuse Father   . Asthma Mother   . Congestive Heart Failure Mother   . Breast cancer Mother        2 (1/2 sisters)  . Osteoarthritis Mother   . Colon cancer Mother   . Lupus Sister   . Alcohol abuse Sister   . Ovarian cancer Sister   . Osteoporosis Sister   . Skin cancer Sister   . Breast cancer Cousin   . Breast cancer Maternal Aunt     SOCIAL HISTORY:   Social History   Tobacco Use  . Smoking status: Former Smoker    Packs/day: 1.00    Years: 45.00    Pack years: 45.00    Types: Cigarettes    Last attempt to quit: 11/11/2003    Years since quitting: 13.5  . Smokeless tobacco: Never Used  Substance Use Topics  . Alcohol use: No    Alcohol/week: 0.0 oz    Comment: occasional  . Drug use: No    ALLERGIES:  is allergic to evista [raloxifene] and fluticasone-salmeterol.  MEDICATIONS:  Current Outpatient Medications  Medication Sig Dispense Refill  . albuterol (PROVENTIL HFA;VENTOLIN HFA) 108 (90 Base) MCG/ACT inhaler Inhale 2 puffs into the lungs every 6 (six) hours as needed for wheezing or shortness of breath. 1 Inhaler 2  . amLODipine (NORVASC) 5 MG tablet TAKE 1 TABLET BY MOUTH  DAILY 90 tablet 1  . aspirin EC 81 MG EC tablet Take 1 tablet (81 mg total) by mouth daily. 30 tablet 1  . CALCIUM CARBONATE PO Take 1 tablet by mouth every Monday, Wednesday, and Friday.    . cholecalciferol (VITAMIN D) 1000 units tablet Take 1,000 Units by mouth daily.    . clopidogrel (PLAVIX) 75 MG  tablet Take 1 tablet (75 mg total) by mouth daily. 30 tablet 2  . fluticasone (FLONASE) 50 MCG/ACT nasal spray Place 2 sprays into both nostrils daily. (Patient taking differently: Place 2 sprays into both nostrils daily as needed for allergies (congestion). ) 16 g 5  . ipratropium-albuterol (DUONEB) 0.5-2.5 (3) MG/3ML SOLN Take 3 mLs by nebulization every 6 (six) hours as needed (for shortness of breath). 36 mL 0  . losartan (COZAAR) 100 MG tablet TAKE 1 TABLET BY MOUTH  DAILY (Patient taking differently: TAKE 136m BY MOUTH  DAILY) 90 tablet 1  . lovastatin (MEVACOR) 40 MG tablet Take 1 tablet (40 mg total) by mouth daily. 10 tablet 0  . metoprolol tartrate (LOPRESSOR) 25 MG tablet TAKE ONE-HALF TABLET BY  MOUTH TWO TIMES DAILY (Patient taking differently: TAKE 12.534mBY MOUTH TWO TIMES DAILY) 90 tablet 1  .  potassium chloride (K-DUR,KLOR-CON) 10 MEQ tablet Take 10 mEq by mouth daily.     . rosuvastatin (CRESTOR) 20 MG tablet Take 20 mg by mouth.    . lidocaine-prilocaine (EMLA) cream Apply to affected area once (Patient not taking: Reported on 05/09/2017) 30 g 3  . ondansetron (ZOFRAN) 8 MG tablet Take 1 tablet (8 mg total) by mouth 2 (two) times daily as needed for refractory nausea / vomiting. Start on day 3 after carboplatin chemo. (Patient not taking: Reported on 04/24/2017) 30 tablet 1  . pantoprazole (PROTONIX) 40 MG tablet Take 1 tablet (40 mg total) by mouth daily. (Patient not taking: Reported on 04/24/2017) 30 tablet 1  . prochlorperazine (COMPAZINE) 10 MG tablet Take 1 tablet (10 mg total) by mouth every 6 (six) hours as needed (Nausea or vomiting). (Patient not taking: Reported on 04/24/2017) 30 tablet 1  . SYMBICORT 160-4.5 MCG/ACT inhaler USE 2 PUFFS TWO TIMES DAILY (Patient not taking: Reported on 05/09/2017) 30.6 g 1   No current facility-administered medications for this visit.     PHYSICAL EXAMINATION: ECOG PERFORMANCE STATUS: 0 - Asymptomatic  BP (!) 179/73   Pulse 73    Temp (!) 97.2 F (36.2 C) (Tympanic)   Resp 20   There were no vitals filed for this visit.  GENERAL: Well-nourished well-developed; Alert, no distress and comfortable.   Alone. EYES: no pallor or icterus OROPHARYNX: no thrush or ulceration; good dentition  NECK: supple, no masses felt LYMPH:  no palpable lymphadenopathy in the cervical, axillary or inguinal regions LUNGS: clear to auscultation and  No wheeze or crackles HEART/CVS: regular rate & rhythm and no murmurs; No lower extremity edema ABDOMEN:abdomen soft, non-tender and normal bowel sounds Musculoskeletal:no cyanosis of digits and no clubbing  PSYCH: alert & oriented x 3 with fluent speech NEURO: no focal motor/sensory deficits SKIN:  no rashes or significant lesions  LABORATORY DATA:  I have reviewed the data as listed    Component Value Date/Time   NA 139 05/09/2017 0920   NA 146 (H) 09/28/2016 1231   K 3.7 05/09/2017 0920   CL 107 05/09/2017 0920   CO2 24 05/09/2017 0920   GLUCOSE 208 (H) 05/09/2017 0920   BUN 22 (H) 05/09/2017 0920   BUN 19 09/28/2016 1231   CREATININE 0.89 05/09/2017 0920   CALCIUM 9.1 05/09/2017 0920   PROT 6.6 05/09/2017 0920   PROT 6.8 05/28/2015 1024   ALBUMIN 3.9 05/09/2017 0920   ALBUMIN 4.4 05/28/2015 1024   AST 22 05/09/2017 0920   ALT 13 (L) 05/09/2017 0920   ALKPHOS 82 05/09/2017 0920   BILITOT 0.6 05/09/2017 0920   BILITOT 0.7 05/28/2015 1024   GFRNONAA 60 (L) 05/09/2017 0920   GFRAA >60 05/09/2017 0920    No results found for: SPEP, UPEP  Lab Results  Component Value Date   WBC 13.6 (H) 05/09/2017   NEUTROABS 11.6 (H) 05/09/2017   HGB 12.6 05/09/2017   HCT 37.7 05/09/2017   MCV 94.1 05/09/2017   PLT 269 05/09/2017      Chemistry      Component Value Date/Time   NA 139 05/09/2017 0920   NA 146 (H) 09/28/2016 1231   K 3.7 05/09/2017 0920   CL 107 05/09/2017 0920   CO2 24 05/09/2017 0920   BUN 22 (H) 05/09/2017 0920   BUN 19 09/28/2016 1231   CREATININE 0.89  05/09/2017 0920   GLU 128 01/22/2015      Component Value Date/Time   CALCIUM 9.1  05/09/2017 0920   ALKPHOS 82 05/09/2017 0920   AST 22 05/09/2017 0920   ALT 13 (L) 05/09/2017 0920   BILITOT 0.6 05/09/2017 0920   BILITOT 0.7 05/28/2015 1024       RADIOGRAPHIC STUDIES: I have personally reviewed the radiological images as listed and agreed with the findings in the report. No results found.   ASSESSMENT & PLAN:  Small cell carcinoma of bladder (HCC) # small cell carcinoma of bladder-muscle invasive/stage II-currently on "NEO-adjuvant therapy" with carbo etoposide status post cycle #3. Clinical no evidence of progression.   #Proceed with cycle #4; Labs today reviewed;  acceptable for treatment today- except for- slightly increased blood suagrs [see below]  # Growth factor-Neulasta/On pro would be given as prophylaxis for chemotherapy-induced neutropenia to prevent febrile neutropenias.  reluctant to get onpro. Plan follow up on   # Elevated Blood pressure- recommend checking at home; also pt did not take her anti- HTN pill yet.   # Elevated Blood sugars- not Diabetic- 200s; check- A1C next visit.   # awaiting appt with Dr[facility]manny at [facility];   # keep appts with Dr.rao/scans as planned. 31st early morning appt for neulasta  [facility]; Dr.Rao.    Orders Placed This Encounter  Procedures  . Hemoglobin A1c    Standing Status:   Future    Standing Expiration Date:   05/09/2018   All questions were answered. The patient knows to call the clinic with any problems, questions or concerns.      Cammie Sickle, MD 05/11/2017 6:34 AM

## 2017-05-10 ENCOUNTER — Inpatient Hospital Stay: Payer: Medicare Other

## 2017-05-10 VITALS — BP 173/60 | HR 80 | Temp 99.0°F | Resp 18

## 2017-05-10 DIAGNOSIS — D751 Secondary polycythemia: Secondary | ICD-10-CM | POA: Diagnosis not present

## 2017-05-10 DIAGNOSIS — C679 Malignant neoplasm of bladder, unspecified: Secondary | ICD-10-CM | POA: Diagnosis not present

## 2017-05-10 DIAGNOSIS — Z5111 Encounter for antineoplastic chemotherapy: Secondary | ICD-10-CM | POA: Diagnosis not present

## 2017-05-10 DIAGNOSIS — D6481 Anemia due to antineoplastic chemotherapy: Secondary | ICD-10-CM | POA: Diagnosis not present

## 2017-05-10 DIAGNOSIS — Z7689 Persons encountering health services in other specified circumstances: Secondary | ICD-10-CM | POA: Diagnosis not present

## 2017-05-10 DIAGNOSIS — K449 Diaphragmatic hernia without obstruction or gangrene: Secondary | ICD-10-CM | POA: Diagnosis not present

## 2017-05-10 MED ORDER — SODIUM CHLORIDE 0.9 % IV SOLN
100.0000 mg/m2 | Freq: Once | INTRAVENOUS | Status: AC
  Administered 2017-05-10: 170 mg via INTRAVENOUS
  Filled 2017-05-10: qty 8.5

## 2017-05-10 MED ORDER — SODIUM CHLORIDE 0.9 % IV SOLN
Freq: Once | INTRAVENOUS | Status: AC
Start: 2017-05-10 — End: 2017-05-10
  Administered 2017-05-10: 14:00:00 via INTRAVENOUS
  Filled 2017-05-10: qty 1000

## 2017-05-10 MED ORDER — DEXAMETHASONE SODIUM PHOSPHATE 10 MG/ML IJ SOLN
10.0000 mg | Freq: Once | INTRAMUSCULAR | Status: AC
  Administered 2017-05-10: 10 mg via INTRAVENOUS
  Filled 2017-05-10: qty 1

## 2017-05-10 MED ORDER — HEPARIN SOD (PORK) LOCK FLUSH 100 UNIT/ML IV SOLN
INTRAVENOUS | Status: AC
  Filled 2017-05-10: qty 5

## 2017-05-10 MED ORDER — HEPARIN SOD (PORK) LOCK FLUSH 100 UNIT/ML IV SOLN
500.0000 [IU] | Freq: Once | INTRAVENOUS | Status: AC
  Administered 2017-05-10: 500 [IU] via INTRAVENOUS

## 2017-05-10 MED ORDER — SODIUM CHLORIDE 0.9% FLUSH
10.0000 mL | INTRAVENOUS | Status: DC | PRN
  Administered 2017-05-10: 10 mL via INTRAVENOUS
  Filled 2017-05-10: qty 10

## 2017-05-11 ENCOUNTER — Inpatient Hospital Stay: Payer: Medicare Other

## 2017-05-11 VITALS — BP 174/69 | HR 80 | Temp 97.8°F | Resp 98

## 2017-05-11 DIAGNOSIS — D6481 Anemia due to antineoplastic chemotherapy: Secondary | ICD-10-CM | POA: Diagnosis not present

## 2017-05-11 DIAGNOSIS — Z7689 Persons encountering health services in other specified circumstances: Secondary | ICD-10-CM | POA: Diagnosis not present

## 2017-05-11 DIAGNOSIS — K449 Diaphragmatic hernia without obstruction or gangrene: Secondary | ICD-10-CM | POA: Diagnosis not present

## 2017-05-11 DIAGNOSIS — C679 Malignant neoplasm of bladder, unspecified: Secondary | ICD-10-CM | POA: Diagnosis not present

## 2017-05-11 DIAGNOSIS — Z5111 Encounter for antineoplastic chemotherapy: Secondary | ICD-10-CM | POA: Diagnosis not present

## 2017-05-11 DIAGNOSIS — D751 Secondary polycythemia: Secondary | ICD-10-CM | POA: Diagnosis not present

## 2017-05-11 MED ORDER — SODIUM CHLORIDE 0.9 % IV SOLN
Freq: Once | INTRAVENOUS | Status: AC
  Administered 2017-05-11: 14:00:00 via INTRAVENOUS
  Filled 2017-05-11: qty 1000

## 2017-05-11 MED ORDER — HEPARIN SOD (PORK) LOCK FLUSH 100 UNIT/ML IV SOLN
500.0000 [IU] | Freq: Once | INTRAVENOUS | Status: AC | PRN
  Administered 2017-05-11: 500 [IU]
  Filled 2017-05-11: qty 5

## 2017-05-11 MED ORDER — DEXAMETHASONE SODIUM PHOSPHATE 10 MG/ML IJ SOLN
10.0000 mg | Freq: Once | INTRAMUSCULAR | Status: AC
  Administered 2017-05-11: 10 mg via INTRAVENOUS
  Filled 2017-05-11: qty 1

## 2017-05-11 MED ORDER — SODIUM CHLORIDE 0.9 % IV SOLN
100.0000 mg/m2 | Freq: Once | INTRAVENOUS | Status: AC
  Administered 2017-05-11: 170 mg via INTRAVENOUS
  Filled 2017-05-11: qty 8.5

## 2017-05-14 ENCOUNTER — Inpatient Hospital Stay: Payer: Medicare Other

## 2017-05-14 DIAGNOSIS — C679 Malignant neoplasm of bladder, unspecified: Secondary | ICD-10-CM

## 2017-05-14 DIAGNOSIS — Z7689 Persons encountering health services in other specified circumstances: Secondary | ICD-10-CM | POA: Diagnosis not present

## 2017-05-14 DIAGNOSIS — D751 Secondary polycythemia: Secondary | ICD-10-CM | POA: Diagnosis not present

## 2017-05-14 DIAGNOSIS — Z5111 Encounter for antineoplastic chemotherapy: Secondary | ICD-10-CM | POA: Diagnosis not present

## 2017-05-14 DIAGNOSIS — K449 Diaphragmatic hernia without obstruction or gangrene: Secondary | ICD-10-CM | POA: Diagnosis not present

## 2017-05-14 DIAGNOSIS — D6481 Anemia due to antineoplastic chemotherapy: Secondary | ICD-10-CM | POA: Diagnosis not present

## 2017-05-14 MED ORDER — PEGFILGRASTIM INJECTION 6 MG/0.6ML ~~LOC~~
6.0000 mg | PREFILLED_SYRINGE | Freq: Once | SUBCUTANEOUS | Status: AC
  Administered 2017-05-14: 6 mg via SUBCUTANEOUS
  Filled 2017-05-14: qty 0.6

## 2017-05-24 ENCOUNTER — Telehealth: Payer: Self-pay | Admitting: Internal Medicine

## 2017-05-24 ENCOUNTER — Telehealth: Payer: Self-pay | Admitting: *Deleted

## 2017-05-24 DIAGNOSIS — R928 Other abnormal and inconclusive findings on diagnostic imaging of breast: Secondary | ICD-10-CM

## 2017-05-24 DIAGNOSIS — Z1239 Encounter for other screening for malignant neoplasm of breast: Secondary | ICD-10-CM

## 2017-05-24 NOTE — Telephone Encounter (Signed)
Denisa, do you mind calling this pt also.  She was recently in the hospital.  She has questions about her mammogram.  See her phone note.  Has a port apparently.  I would need oncology's ok as to whether or not she can have mammogram.  They follow ports, etc.  If they ok, we can get ordered if they desire [name] to order.

## 2017-05-24 NOTE — Telephone Encounter (Signed)
Spoke with Nira Conn from oncology with message received from Dr. Janese Banks.   Yes, OK to proceed as normal in scheduling MM for R breast mass follow up.  They too will continue to follow if a biopsy is needed after completing.  Imaging codes needed for work up are [id number], [id number], [id number].   Will call and notify patient.

## 2017-05-24 NOTE — Telephone Encounter (Signed)
I have placed the order for the mammogram. The 04/2016 mammogram recommended f/u diagnostic bilateral mammogram with right breast ultrasound.  I placed order.  I am not sure regarding her insurance coverage.  She can check with her insurance regarding cost to her.

## 2017-05-24 NOTE — Telephone Encounter (Signed)
PT has a port a cath on left side  and did not have mammogram last year  due to the port being new, PT talked to Breast care center at [facility]  today and was told the mammogram can be done with the port but recommends  getting Dr. Bary Leriche approval.  Pt does not have fax number for Dr Nicki Reaper to fax approval to.

## 2017-05-24 NOTE — Telephone Encounter (Signed)
Please advise. Thanks.  

## 2017-05-24 NOTE — Telephone Encounter (Signed)
In progress.  Spoke with Shirlean Mylar in oncology and now awaiting a call back from Dr. Rogue Bussing, prior to calling patient.   Confirmed with the [place] the need to follow up with MM is in regards to R breast mass previously identified. Will follow.

## 2017-05-24 NOTE — Telephone Encounter (Signed)
I spoke with Tina [name] at Dr. Bary Leriche office. I also spoke with Dr. Janese Banks as this is her patient. We only saw the pt due to Rao's absence.   Dr. Janese Banks will have pcp work up breast mass and then if abn, then oncologist will take care of this from that point.  Patient is currently being tx for bladder cancer.

## 2017-05-24 NOTE — Telephone Encounter (Addendum)
Spoke with patient about concerns regarding mammogram tests and coverage.  I told her I was unsure of what exact tests would be performed but it appeared to be imaging only.  Medicare may be contacted with imaging codes to confirm coverage. Notified her of permission from oncology, breast care center and pcp to proceed as normal with R breast follow up.

## 2017-05-24 NOTE — Telephone Encounter (Signed)
-----   Message from Secundino Ginger sent at 05/24/2017  3:47 PM EST ----- Regarding: permission for mammogram Pt Found a mass in right breast, she has aport in left side. Primary care C [facility] wants permission for mammogram for right breast from her Oncologist. She will order if you give permission.  [place] [phone], leave a msg if she doesn't pick up.

## 2017-05-24 NOTE — Telephone Encounter (Signed)
Please advise 

## 2017-05-25 ENCOUNTER — Inpatient Hospital Stay: Payer: Medicare Other | Attending: Oncology | Admitting: *Deleted

## 2017-05-25 ENCOUNTER — Ambulatory Visit
Admission: RE | Admit: 2017-05-25 | Discharge: 2017-05-25 | Disposition: A | Discharge status: Home / Self Care | Payer: Medicare Other | Source: Ambulatory Visit | Attending: Oncology | Admitting: Oncology

## 2017-05-25 ENCOUNTER — Other Ambulatory Visit: Payer: Self-pay | Admitting: Oncology

## 2017-05-25 ENCOUNTER — Telehealth: Payer: Self-pay | Admitting: *Deleted

## 2017-05-25 DIAGNOSIS — N281 Cyst of kidney, acquired: Secondary | ICD-10-CM | POA: Diagnosis not present

## 2017-05-25 DIAGNOSIS — C679 Malignant neoplasm of bladder, unspecified: Secondary | ICD-10-CM | POA: Insufficient documentation

## 2017-05-25 DIAGNOSIS — Z800 Family history of malignant neoplasm of digestive organs: Secondary | ICD-10-CM | POA: Insufficient documentation

## 2017-05-25 DIAGNOSIS — D751 Secondary polycythemia: Secondary | ICD-10-CM | POA: Insufficient documentation

## 2017-05-25 DIAGNOSIS — I739 Peripheral vascular disease, unspecified: Secondary | ICD-10-CM | POA: Diagnosis not present

## 2017-05-25 DIAGNOSIS — I700 Atherosclerosis of aorta: Secondary | ICD-10-CM | POA: Insufficient documentation

## 2017-05-25 DIAGNOSIS — Z803 Family history of malignant neoplasm of breast: Secondary | ICD-10-CM | POA: Diagnosis not present

## 2017-05-25 DIAGNOSIS — Z7982 Long term (current) use of aspirin: Secondary | ICD-10-CM | POA: Insufficient documentation

## 2017-05-25 DIAGNOSIS — I2699 Other pulmonary embolism without acute cor pulmonale: Secondary | ICD-10-CM | POA: Insufficient documentation

## 2017-05-25 DIAGNOSIS — Z8041 Family history of malignant neoplasm of ovary: Secondary | ICD-10-CM | POA: Diagnosis not present

## 2017-05-25 DIAGNOSIS — Z7901 Long term (current) use of anticoagulants: Secondary | ICD-10-CM | POA: Insufficient documentation

## 2017-05-25 DIAGNOSIS — Z5111 Encounter for antineoplastic chemotherapy: Secondary | ICD-10-CM | POA: Insufficient documentation

## 2017-05-25 DIAGNOSIS — Z87891 Personal history of nicotine dependence: Secondary | ICD-10-CM | POA: Insufficient documentation

## 2017-05-25 DIAGNOSIS — Z95828 Presence of other vascular implants and grafts: Secondary | ICD-10-CM

## 2017-05-25 DIAGNOSIS — Z85828 Personal history of other malignant neoplasm of skin: Secondary | ICD-10-CM | POA: Insufficient documentation

## 2017-05-25 LAB — COMPREHENSIVE METABOLIC PANEL
ALT: 15 U/L (ref 14–54)
ANION GAP: 10 (ref 5–15)
AST: 21 U/L (ref 15–41)
Albumin: 4.3 g/dL (ref 3.5–5.0)
Alkaline Phosphatase: 108 U/L (ref 38–126)
BUN: 18 mg/dL (ref 6–20)
CO2: 25 mmol/L (ref 22–32)
CREATININE: 0.82 mg/dL (ref 0.44–1.00)
Calcium: 9.6 mg/dL (ref 8.9–10.3)
Chloride: 102 mmol/L (ref 101–111)
Glucose, Bld: 137 mg/dL — ABNORMAL HIGH (ref 65–99)
Potassium: 4.2 mmol/L (ref 3.5–5.1)
SODIUM: 137 mmol/L (ref 135–145)
TOTAL PROTEIN: 7.2 g/dL (ref 6.5–8.1)
Total Bilirubin: 0.5 mg/dL (ref 0.3–1.2)

## 2017-05-25 LAB — CBC WITH DIFFERENTIAL/PLATELET
BASOS ABS: 0.1 10*3/uL (ref 0–0.1)
BASOS PCT: 1 %
Eosinophils Absolute: 0.1 10*3/uL (ref 0–0.7)
Eosinophils Relative: 1 %
HEMATOCRIT: 40 % (ref 35.0–47.0)
HEMOGLOBIN: 13.1 g/dL (ref 12.0–16.0)
Lymphocytes Relative: 14 %
Lymphs Abs: 2.3 10*3/uL (ref 1.0–3.6)
MCH: 31.8 pg (ref 26.0–34.0)
MCHC: 32.8 g/dL (ref 32.0–36.0)
MCV: 97 fL (ref 80.0–100.0)
MONOS PCT: 6 %
Monocytes Absolute: 1 10*3/uL — ABNORMAL HIGH (ref 0.2–0.9)
NEUTROS ABS: 12.8 10*3/uL — AB (ref 1.4–6.5)
NEUTROS PCT: 78 %
Platelets: 242 10*3/uL (ref 150–440)
RBC: 4.13 MIL/uL (ref 3.80–5.20)
RDW: 18.3 % — ABNORMAL HIGH (ref 11.5–14.5)
WBC: 16.2 10*3/uL — AB (ref 3.6–11.0)

## 2017-05-25 LAB — HEMOGLOBIN A1C
HEMOGLOBIN A1C: 5.1 % (ref 4.8–5.6)
MEAN PLASMA GLUCOSE: 99.67 mg/dL

## 2017-05-25 MED ORDER — IOPAMIDOL (ISOVUE-300) INJECTION 61%
100.0000 mL | Freq: Once | INTRAVENOUS | Status: AC | PRN
  Administered 2017-05-25: 100 mL via INTRAVENOUS

## 2017-05-25 MED ORDER — APIXABAN 5 MG PO TABS: 5.0000 mg | ORAL_TABLET | Freq: Two times a day (BID) | ORAL | 0 refills | Status: DC

## 2017-05-25 MED ORDER — HEPARIN SOD (PORK) LOCK FLUSH 100 UNIT/ML IV SOLN
500.0000 [IU] | INTRAVENOUS | Status: AC | PRN
  Administered 2017-05-25: 500 [IU]

## 2017-05-25 MED ORDER — SODIUM CHLORIDE 0.9% FLUSH
10.0000 mL | INTRAVENOUS | Status: AC | PRN
  Administered 2017-05-25: 10 mL
  Filled 2017-05-25: qty 10

## 2017-05-25 NOTE — Telephone Encounter (Signed)
Noted  

## 2017-05-25 NOTE — Telephone Encounter (Signed)
Dr. Janese Banks wanted me to call patient today about her CT scan results.  The bladder cancer could not be seen at all on scan which is good news but the patient does have a blood clot in her right middle lobe and lower lobe lung.  Because patient has a lung clot she will need to start on a blood thinner right away.  She will need to take Eliquis 5 mg tablets she needs to take 2 tablets twice a day for 7 days.  After 7 days she will take 5 mg which is 1 pill twice a day.  Patient wanted to see which would be cheaper getting it however drug or getting it from her mail in pharmacy.  I advised her that getting it through the mail in pharmacy would not come fast enough in order for her to get the drug as quick as possible.  Did call however drug which is her local pharmacy she can get a 3-week supply for $47.  I did call her mail in pharmacy and they said there is no way they can get her the medicine before Monday or Tuesday of next week but she can get a 52-month supply of 5 mg twice a day with $105.00.  Patient agreeable to going up on her first supply from however drug she will either start it tonight or tomorrow and then I will wait till the end of next week per the person I talked to [place] at mail in pharmacy said if I wait to the end of the week then I can call in 5 mg twice a day.  Pt agreeable to above and she will pick up rx either tonight or tom.

## 2017-05-29 ENCOUNTER — Encounter: Payer: Self-pay | Admitting: Oncology

## 2017-05-29 ENCOUNTER — Telehealth: Payer: Self-pay | Admitting: *Deleted

## 2017-05-29 ENCOUNTER — Inpatient Hospital Stay (HOSPITAL_BASED_OUTPATIENT_CLINIC_OR_DEPARTMENT_OTHER): Payer: Medicare Other | Admitting: Oncology

## 2017-05-29 ENCOUNTER — Telehealth: Payer: Self-pay | Admitting: Internal Medicine

## 2017-05-29 DIAGNOSIS — Z7982 Long term (current) use of aspirin: Secondary | ICD-10-CM | POA: Diagnosis not present

## 2017-05-29 DIAGNOSIS — Z7901 Long term (current) use of anticoagulants: Secondary | ICD-10-CM | POA: Diagnosis not present

## 2017-05-29 DIAGNOSIS — Z8041 Family history of malignant neoplasm of ovary: Secondary | ICD-10-CM | POA: Diagnosis not present

## 2017-05-29 DIAGNOSIS — I2699 Other pulmonary embolism without acute cor pulmonale: Secondary | ICD-10-CM | POA: Diagnosis not present

## 2017-05-29 DIAGNOSIS — Z87891 Personal history of nicotine dependence: Secondary | ICD-10-CM | POA: Diagnosis not present

## 2017-05-29 DIAGNOSIS — Z85828 Personal history of other malignant neoplasm of skin: Secondary | ICD-10-CM | POA: Diagnosis not present

## 2017-05-29 DIAGNOSIS — C679 Malignant neoplasm of bladder, unspecified: Secondary | ICD-10-CM

## 2017-05-29 DIAGNOSIS — Z803 Family history of malignant neoplasm of breast: Secondary | ICD-10-CM

## 2017-05-29 DIAGNOSIS — D751 Secondary polycythemia: Secondary | ICD-10-CM | POA: Diagnosis not present

## 2017-05-29 DIAGNOSIS — I739 Peripheral vascular disease, unspecified: Secondary | ICD-10-CM | POA: Diagnosis not present

## 2017-05-29 DIAGNOSIS — Z800 Family history of malignant neoplasm of digestive organs: Secondary | ICD-10-CM

## 2017-05-29 NOTE — Telephone Encounter (Signed)
Copied from [place] [id number]. Topic: Quick Communication - Rx Refill/Question >> May 29, 2017 10:14 AM Arletha Grippe wrote: Medication potassium chloride (K-DUR,KLOR-CON) 10 MEQ tablet   Has the patient contacted their pharmacy? Yes.  Pharmacy called    (Agent: If no, request that the patient contact the pharmacy for the refill.)   Preferred Pharmacy (with phone number or street name): reference [id number], cb# [phone], [facility]    Agent: Please be advised that RX refills may take up to 3 business days. We ask that you follow-up with your pharmacy.

## 2017-05-29 NOTE — Addendum Note (Signed)
Addended by: Randa Evens C on: 05/29/2017 01:42 PM   Modules accepted: Orders

## 2017-05-29 NOTE — Telephone Encounter (Signed)
Routed to provider  LR: 02/09/17

## 2017-05-29 NOTE — Telephone Encounter (Signed)
Please advise 

## 2017-05-29 NOTE — Telephone Encounter (Signed)
It looks like multiple doctors have prescribed this. I wasn't sure if we were following or if cardiology should be refilling. Please advise.

## 2017-05-29 NOTE — Progress Notes (Signed)
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 She has had jak 2 testing as well as EPO levels in the past which was unremarkable. She gets a phlebotomy when her hematocrit is more than 50. She was last seen by me in June 2018. She also has a history of lung nodules and was getting low-dose lung cancer screening scans on 07/04/2014 when her insurance stopped paying.   She recently presented to the hospital in October 2018 with symptoms of nausea and diarrhea weight loss and fatigue. She was noted to have irregular wall thickening in the ascending colon suspicious for colon cancer as well as 4.2 x 3.2 cm posterior bladder wall mass that was incidentally noted. She was also seen by vascular surgery and underwent abdominal angiogram with stent placement at the [facility]. She is on aspirin and Plavix for the same. Patient underwent colonoscopy for concerns of ischemic colitis which showed irregular thickening of the descending colon consistent with ischemic colitis. EGD showed a small hiatal hernia. Losing gastric ulcers were noted which was treated with argon photocoagulation.  With regards to the posterior bladder wall mass she was seen by Dr. Erlene Quan and underwent TURBT with bilateral ureteral stent placement on 02/13/2017. Pathology showed small cell carcinoma with muscularis propria invasion. 4 cycles of carbo/etoposide completed on 12/261/8   Interval history- she is doing well since  completing her chemo. Denies any complaints today  ECOG PS- 0 Pain scale- 0   Review of systems- Review of Systems  Constitutional: Negative for chills, fever, malaise/fatigue and weight loss.  HENT: Negative for congestion, ear discharge and nosebleeds.   Eyes: Negative for blurred vision.  Respiratory: Negative for cough, hemoptysis, sputum production, shortness of breath and wheezing.   Cardiovascular: Negative for chest pain, palpitations, orthopnea and claudication.  Gastrointestinal: Negative for abdominal pain, blood in stool, constipation, diarrhea, heartburn, melena, nausea and vomiting.  Genitourinary: Negative for dysuria, flank pain, frequency, hematuria and urgency.  Musculoskeletal: Negative for back pain, joint pain and myalgias.  Skin: Negative for rash.  Neurological: Negative for dizziness, tingling, focal weakness, seizures, weakness and headaches.  Endo/Heme/Allergies: Does not bruise/bleed easily.  Psychiatric/Behavioral: Negative for depression and suicidal ideas. The patient does not have insomnia.       Allergies  Allergen Reactions  . Evista [Raloxifene] Other (See Comments)    Night sweats   . Fluticasone-Salmeterol Other (See Comments)    Cough, "chokes me"      Past Medical History:  Diagnosis Date  . Anemia   . Asthma   . Bladder cancer ([place])   . BRCA positive   . CAD (coronary artery disease)   . COPD (chronic obstructive pulmonary disease) ([place])   . Emphysema lung ([place])   . GERD (gastroesophageal reflux disease)   . History of colon polyps   . Hypercholesterolemia   . Hyperglycemia   . Hypertension   . Lung nodules   . Osteoporosis   . Personal history of tobacco use, presenting hazards to health 11/25/2014  . Polycythemia vera(238.4)   .  Renal cyst   . Skin cancer      Past Surgical History:  Procedure Laterality Date  . ANGIOPLASTY     coronary (x1)  . COLONOSCOPY WITH PROPOFOL N/A 03/02/2015   Procedure: COLONOSCOPY WITH  PROPOFOL;  Surgeon: Lollie Sails, MD;  Location[facility] END[facility]COPY;  Service: Endoscopy;  Laterality: N/A;  . COLONOSCOPY WITH PROPOFOL N/A 02/13/2017   Procedure: COLONOSCOPY WITH PROPOFOL;  Surgeon: [name], MD;  Location: [facility] ENDOSCOPY;  Service: Endoscopy;  Laterality: N/A;  . CORONARY ANGIOPLASTY    . CORONARY ARTERY BYPASS GRAFT    . CYSTOSCOPY W/ RETROGRADES Bilateral 02/13/2017   Procedure: CYSTOSCOPY WITH RETROGRADE PYELOGRAM;  Surgeon: Hollice Espy, MD;  Location: [facility] ORS;  Service: Urology;  Laterality: Bilateral;  . CYSTOSCOPY WITH BIOPSY  03/26/2017   Procedure: CYSTOSCOPY WITH BIOPSY;  Surgeon: Hollice Espy, MD;  Location: [facility] ORS;  Service: Urology;;  . Consuela Mimes WITH STENT PLACEMENT Bilateral 02/13/2017   Procedure: CYSTOSCOPY WITH STENT PLACEMENT;  Surgeon: Hollice Espy, MD;  Location: [facility] ORS;  Service: Urology;  Laterality: Bilateral;  . CYSTOSCOPY WITH STENT PLACEMENT Bilateral 03/26/2017   Procedure: CYSTOSCOPY WITH STENT EXCHANGE;  Surgeon: Hollice Espy, MD;  Location: [facility] ORS;  Service: Urology;  Laterality: Bilateral;  . ESOPHAGOGASTRODUODENOSCOPY (EGD) WITH PROPOFOL N/A 02/13/2017   Procedure: ESOPHAGOGASTRODUODENOSCOPY (EGD) WITH PROPOFOL;  Surgeon: [name], MD;  Location: [facility] ENDOSCOPY;  Service: Endoscopy;  Laterality: N/A;  . PORTA CATH INSERTION N/A 02/28/2017   Procedure: PORTA CATH INSERTION;  Surgeon: Algernon Huxley, MD;  Location: [place] CV LAB;  Service: Cardiovascular;  Laterality: N/A;  . SALPINGOOPHORECTOMY    . TONSILECTOMY/ADENOIDECTOMY WITH MYRINGOTOMY    . TRANSURETHRAL RESECTION OF BLADDER TUMOR N/A 02/13/2017   Procedure: TRANSURETHRAL RESECTION OF BLADDER TUMOR (TURBT);  Surgeon: Hollice Espy, MD;  Location: [facility] ORS;  Service: Urology;  Laterality: N/A;  . TUBAL LIGATION    . UPPER GI ENDOSCOPY  02/13/2017  . VISCERAL ARTERY INTERVENTION N/A 02/12/2017   Procedure: VISCERAL ARTERY INTERVENTION;  Surgeon: Algernon Huxley, MD;  Location: [place] CV LAB;  Service: Cardiovascular;  Laterality: N/A;    Social History   Socioeconomic History  . Marital status: Single    Spouse name: Not on file  . Number of children: 2  . Years of education: Not on file  . Highest education level: Not on file  Social Needs  . Financial resource strain: Not on file  . Food insecurity - worry: Not on file  . Food insecurity - inability: Not on file  . Transportation needs - medical: Not on file  . Transportation needs - non-medical: Not on file  Occupational History  . Not on file  Tobacco Use  . Smoking status: Former Smoker    Packs/day: 1.00    Years: 45.00    Pack years: 45.00    Types: Cigarettes    Last attempt to quit: 11/11/2003    Years since quitting: 13.5  . Smokeless tobacco: Never Used  Substance and Sexual Activity  . Alcohol use: No    Alcohol/week: 0.0 oz    Comment: occasional  . Drug use: No  . Sexual activity: No  Other Topics Concern  . Not on file  Social History Narrative  . Not on file    Family History  Problem Relation Age of Onset  . Cirrhosis Father        died age 19  . Alcohol abuse Father   . Asthma Mother   .  Congestive Heart Failure Mother   . Breast cancer Mother        2 (1/2 sisters)  . Osteoarthritis Mother   . Colon cancer Mother   . Lupus Sister   . Alcohol abuse Sister   . Ovarian cancer Sister   . Osteoporosis Sister   . Skin cancer Sister   . Breast cancer Cousin   . Breast cancer Maternal Aunt      Current Outpatient Medications:  .  albuterol (PROVENTIL HFA;VENTOLIN HFA) 108 (90 Base) MCG/ACT inhaler, Inhale 2 puffs into the lungs every 6 (six) hours as needed for wheezing or shortness of breath., Disp: 1 Inhaler, Rfl: 2 .  amLODipine (NORVASC) 5 MG tablet, TAKE 1 TABLET BY MOUTH  DAILY, Disp: 90 tablet, Rfl: 1 .  apixaban (ELIQUIS) 5 MG TABS tablet, Take 1 tablet (5 mg total) by mouth 2 (two) times daily. 2 tablets =10 mg twice a day for 7  days and then 1 tablet=5 mg bid, Disp: 60 tablet, Rfl: 0 .  aspirin EC 81 MG EC tablet, Take 1 tablet (81 mg total) by mouth daily., Disp: 30 tablet, Rfl: 1 .  CALCIUM CARBONATE PO, Take 1 tablet by mouth every Monday, Wednesday, and Friday., Disp: , Rfl:  .  cholecalciferol (VITAMIN D) 1000 units tablet, Take 1,000 Units by mouth daily., Disp: , Rfl:  .  clopidogrel (PLAVIX) 75 MG tablet, Take 1 tablet (75 mg total) by mouth daily., Disp: 30 tablet, Rfl: 2 .  fluticasone (FLONASE) 50 MCG/ACT nasal spray, Place 2 sprays into both nostrils daily. (Patient taking differently: Place 2 sprays into both nostrils daily as needed for allergies (congestion). ), Disp: 16 g, Rfl: 5 .  ipratropium-albuterol (DUONEB) 0.5-2.5 (3) MG/3ML SOLN, Take 3 mLs by nebulization every 6 (six) hours as needed (for shortness of breath)., Disp: 36 mL, Rfl: 0 .  losartan (COZAAR) 100 MG tablet, TAKE 1 TABLET BY MOUTH  DAILY (Patient taking differently: TAKE 194m BY MOUTH  DAILY), Disp: 90 tablet, Rfl: 1 .  lovastatin (MEVACOR) 40 MG tablet, Take 1 tablet (40 mg total) by mouth daily., Disp: 10 tablet, Rfl: 0 .  metoprolol tartrate (LOPRESSOR) 25 MG tablet, TAKE ONE-HALF TABLET BY  MOUTH TWO TIMES DAILY (Patient taking differently: TAKE 12.558mBY MOUTH TWO TIMES DAILY), Disp: 90 tablet, Rfl: 1 .  potassium chloride (K-DUR,KLOR-CON) 10 MEQ tablet, Take 10 mEq by mouth daily. , Disp: , Rfl:  .  rosuvastatin (CRESTOR) 20 MG tablet, Take 20 mg by mouth., Disp: , Rfl:  .  SYMBICORT 160-4.5 MCG/ACT inhaler, USE 2 PUFFS TWO TIMES DAILY, Disp: 30.6 g, Rfl: 1 .  ondansetron (ZOFRAN) 8 MG tablet, Take 1 tablet (8 mg total) by mouth 2 (two) times daily as needed for refractory nausea / vomiting. Start on day 3 after carboplatin chemo. (Patient not taking: Reported on 04/24/2017), Disp: 30 tablet, Rfl: 1 .  prochlorperazine (COMPAZINE) 10 MG tablet, Take 1 tablet (10 mg total) by mouth every 6 (six) hours as needed (Nausea or vomiting).  (Patient not taking: Reported on 04/24/2017), Disp: 30 tablet, Rfl: 1  Physical exam:  Vitals:   05/29/17 1155  BP: (!) 167/74  Pulse: 73  Resp: 18  Temp: 99.2 F (37.3 C)  TempSrc: Tympanic  Weight: 131 lb 8 oz (59.6 kg)   Physical Exam  Constitutional: She is oriented to person, place, and time and well-developed, well-nourished, and in no distress.  HENT:  Head: Normocephalic and atraumatic.  Eyes: EOM are  normal. Pupils are equal, round, and reactive to light.  Neck: Normal range of motion.  Cardiovascular: Normal rate, regular rhythm and normal heart sounds.  Pulmonary/Chest: Effort normal and breath sounds normal.  Abdominal: Soft. Bowel sounds are normal.  Musculoskeletal: She exhibits edema (trace b/l).  Neurological: She is alert and oriented to person, place, and time.  Skin: Skin is warm and dry.     CMP Latest Ref Rng & Units 05/25/2017  Glucose 65 - 99 mg/dL 137(H)  BUN 6 - 20 mg/dL 18  Creatinine 0.44 - 1.00 mg/dL 0.82  Sodium 135 - 145 mmol/L 137  Potassium 3.5 - 5.1 mmol/L 4.2  Chloride 101 - 111 mmol/L 102  CO2 22 - 32 mmol/L 25  Calcium 8.9 - 10.3 mg/dL 9.6  Total Protein 6.5 - 8.1 g/dL 7.2  Total Bilirubin 0.3 - 1.2 mg/dL 0.5  Alkaline Phos 38 - 126 U/L 108  AST 15 - 41 U/L 21  ALT 14 - 54 U/L 15   CBC Latest Ref Rng & Units 05/25/2017  WBC 3.6 - 11.0 K/uL 16.2(H)  Hemoglobin 12.0 - 16.0 g/dL 13.1  Hematocrit 35.0 - 47.0 % 40.0  Platelets 150 - 440 K/uL 242    No images are attached to the encounter.  Ct Chest W Contrast  Addendum Date: 05/25/2017   ADDENDUM REPORT: 05/25/2017 14:46 ADDENDUM: Critical Value/emergent results were called by telephone at the time of interpretation on 05/25/2017 at 2:46 pm to Dr. Astrid Divine RAO , who verbally acknowledged these results. Also, for clarity, the 5 cm posterior bladder wall mass seen on the previous study has resolved completely by imaging in the interval. Electronically Signed   By: Misty Stanley M.D.   On:  05/25/2017 14:46   Result Date: 05/25/2017 CLINICAL DATA:  Small-cell carcinoma of the bladder. EXAM: CT CHEST, ABDOMEN, AND PELVIS WITH CONTRAST TECHNIQUE: Multidetector CT imaging of the chest, abdomen and pelvis was performed following the standard protocol during bolus administration of intravenous contrast. CONTRAST:  100 cc Isovue-300 COMPARISON:  Abdomen and pelvis CT 02/11/2017 chest CT 06/15/2016. PET-CT 02/27/2017. FINDINGS: CT CHEST FINDINGS Cardiovascular: Heart size upper normal. No pericardial effusion. Coronary artery calcification is evident. Atherosclerotic calcification is noted in the wall of the thoracic aorta. Pulmonary embolus is identified in lobar and segmental right middle and lower lobe pulmonary arteries. Left-sided Port-A-Cath tip is positioned in the proximal SVC. Mediastinum/Nodes: No mediastinal lymphadenopathy. There is no hilar lymphadenopathy. The esophagus has normal imaging features. There is no axillary lymphadenopathy. Lungs/Pleura: 3 mm left upper lobe pulmonary nodule is seen on image 40 series 4. This is stable in the interval since 06/15/2016 compatible with benign etiology. Stable chronic atelectasis or scarring in the lingula. No pleural effusion. Musculoskeletal: Bone windows reveal no worrisome lytic or sclerotic osseous lesions. CT ABDOMEN PELVIS FINDINGS Hepatobiliary: No focal abnormality within the liver parenchyma. There is no evidence for gallstones, gallbladder wall thickening, or pericholecystic fluid. No intrahepatic or extrahepatic biliary dilation. Pancreas: No focal mass lesion. No dilatation of the main duct. No intraparenchymal cyst. No peripancreatic edema. Spleen: No splenomegaly. No focal mass lesion. Adrenals/Urinary Tract: No adrenal nodule or mass. Similar appearance 6.1 cm left renal cyst other scattered small cystic renal lesions are similar bilaterally Delayed imaging shows mild fullness of the right intrarenal collecting system without wall  thickening or soft tissue filling defect. Left ureter unremarkable. Proximal right ureter is not well opacified and there is a suggestion of circumferential wall thickening in the ureter on  delayed image 26 of series 7. This segment of the right ureter appears better distended without appreciable wall thickening on the portal venous phase images (see image 81 series 2). No focal bladder wall abnormality evident with incomplete opacification of the bladder lumen. Stomach/Bowel: Stomach is nondistended. No gastric wall thickening. No evidence of outlet obstruction. Duodenum is normally positioned as is the ligament of Treitz. No small bowel wall thickening. No small bowel dilatation. The terminal ileum is normal. The appendix is normal. No gross colonic mass. No colonic wall thickening. No substantial diverticular change. Vascular/Lymphatic: There is abdominal aortic atherosclerosis without aneurysm. Dense plaque identified at the level of the SMA with intraluminal aortic involvement. Aorta is opacified distal to this level and is similar in appearance to the prior study. There is no gastrohepatic or hepatoduodenal ligament lymphadenopathy. No intraperitoneal or retroperitoneal lymphadenopathy. No pelvic sidewall lymphadenopathy. Reproductive: The uterus has normal CT imaging appearance. There is no adnexal mass. Other: No intraperitoneal free fluid. Musculoskeletal: Bone windows reveal no worrisome lytic or sclerotic osseous lesions. Superior endplate compression at L2 is similar to prior. IMPRESSION: 1. Nonocclusive lobar and segmental pulmonary embolus identified in the right middle and lower lobes. 2. No findings on today's study to suggest local recurrence in the bladder or metastatic disease in the chest, abdomen, or pelvis. 3. Short segment of proximal right ureter appears narrowed with question of circumferential wall thickening on delayed imaging. This portion of the ureter has a more normal appearance on the  portal venous phase sequence. Close attention recommended as urothelial lesion cannot be completely excluded. 4.  Aortic Atherosclerois (ICD10-170.0) 5. Renal cysts, similar to prior. Electronically Signed: By: Misty Stanley M.D. On: 05/25/2017 14:22   Ct Abdomen Pelvis W Contrast  Addendum Date: 05/25/2017   ADDENDUM REPORT: 05/25/2017 14:46 ADDENDUM: Critical Value/emergent results were called by telephone at the time of interpretation on 05/25/2017 at 2:46 pm to Dr. Astrid Divine RAO , who verbally acknowledged these results. Also, for clarity, the 5 cm posterior bladder wall mass seen on the previous study has resolved completely by imaging in the interval. Electronically Signed   By: Misty Stanley M.D.   On: 05/25/2017 14:46   Result Date: 05/25/2017 CLINICAL DATA:  Small-cell carcinoma of the bladder. EXAM: CT CHEST, ABDOMEN, AND PELVIS WITH CONTRAST TECHNIQUE: Multidetector CT imaging of the chest, abdomen and pelvis was performed following the standard protocol during bolus administration of intravenous contrast. CONTRAST:  100 cc Isovue-300 COMPARISON:  Abdomen and pelvis CT 02/11/2017 chest CT 06/15/2016. PET-CT 02/27/2017. FINDINGS: CT CHEST FINDINGS Cardiovascular: Heart size upper normal. No pericardial effusion. Coronary artery calcification is evident. Atherosclerotic calcification is noted in the wall of the thoracic aorta. Pulmonary embolus is identified in lobar and segmental right middle and lower lobe pulmonary arteries. Left-sided Port-A-Cath tip is positioned in the proximal SVC. Mediastinum/Nodes: No mediastinal lymphadenopathy. There is no hilar lymphadenopathy. The esophagus has normal imaging features. There is no axillary lymphadenopathy. Lungs/Pleura: 3 mm left upper lobe pulmonary nodule is seen on image 40 series 4. This is stable in the interval since 06/15/2016 compatible with benign etiology. Stable chronic atelectasis or scarring in the lingula. No pleural effusion. Musculoskeletal:  Bone windows reveal no worrisome lytic or sclerotic osseous lesions. CT ABDOMEN PELVIS FINDINGS Hepatobiliary: No focal abnormality within the liver parenchyma. There is no evidence for gallstones, gallbladder wall thickening, or pericholecystic fluid. No intrahepatic or extrahepatic biliary dilation. Pancreas: No focal mass lesion. No dilatation of the main duct. No intraparenchymal cyst.  No peripancreatic edema. Spleen: No splenomegaly. No focal mass lesion. Adrenals/Urinary Tract: No adrenal nodule or mass. Similar appearance 6.1 cm left renal cyst other scattered small cystic renal lesions are similar bilaterally Delayed imaging shows mild fullness of the right intrarenal collecting system without wall thickening or soft tissue filling defect. Left ureter unremarkable. Proximal right ureter is not well opacified and there is a suggestion of circumferential wall thickening in the ureter on delayed image 26 of series 7. This segment of the right ureter appears better distended without appreciable wall thickening on the portal venous phase images (see image 81 series 2). No focal bladder wall abnormality evident with incomplete opacification of the bladder lumen. Stomach/Bowel: Stomach is nondistended. No gastric wall thickening. No evidence of outlet obstruction. Duodenum is normally positioned as is the ligament of Treitz. No small bowel wall thickening. No small bowel dilatation. The terminal ileum is normal. The appendix is normal. No gross colonic mass. No colonic wall thickening. No substantial diverticular change. Vascular/Lymphatic: There is abdominal aortic atherosclerosis without aneurysm. Dense plaque identified at the level of the SMA with intraluminal aortic involvement. Aorta is opacified distal to this level and is similar in appearance to the prior study. There is no gastrohepatic or hepatoduodenal ligament lymphadenopathy. No intraperitoneal or retroperitoneal lymphadenopathy. No pelvic sidewall  lymphadenopathy. Reproductive: The uterus has normal CT imaging appearance. There is no adnexal mass. Other: No intraperitoneal free fluid. Musculoskeletal: Bone windows reveal no worrisome lytic or sclerotic osseous lesions. Superior endplate compression at L2 is similar to prior. IMPRESSION: 1. Nonocclusive lobar and segmental pulmonary embolus identified in the right middle and lower lobes. 2. No findings on today's study to suggest local recurrence in the bladder or metastatic disease in the chest, abdomen, or pelvis. 3. Short segment of proximal right ureter appears narrowed with question of circumferential wall thickening on delayed imaging. This portion of the ureter has a more normal appearance on the portal venous phase sequence. Close attention recommended as urothelial lesion cannot be completely excluded. 4.  Aortic Atherosclerois (ICD10-170.0) 5. Renal cysts, similar to prior. Electronically Signed: By: Misty Stanley M.D. On: 05/25/2017 14:22   US Renal  Result Date: 05/02/2017 CLINICAL DATA:  Malignant neoplasm of urinary bladder. Post removal of ureter stents. EXAM: RENAL / URINARY TRACT ULTRASOUND COMPLETE COMPARISON:  PET-CT 02/27/2017 FINDINGS: Right Kidney: Length: 10.3 cm. Echogenicity within normal limits. No mass or hydronephrosis visualized. Left Kidney: Length: 10.8 cm. Normal echogenicity. Minimal fullness in the central left renal collecting system but no hydronephrosis. Large exophytic anechoic cyst involving the left kidney lower pole. This cyst measures 7.2 x 4.5 x 6.1 cm. Bladder: Appears normal for degree of bladder distention. Evidence for bilateral ureter jets. IMPRESSION: Negative for hydronephrosis following removal of the ureter stents. Large left renal cyst. Electronically Signed   By: Markus Daft M.D.   On: 05/02/2017 17:10     Assessment and plan- Patient is a 83 y.o. 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  CT shows complete resolution of previously noted bladder mass.  No intra-abdominal adenopathy or evidence of distant metastatic disease.  She has now completed her chemotherapy and I would like her to proceed with either definitive surgery or radiation treatment.  I will touch base with Dr. Erlene Quan as patient has not heard back from Dr. Zettie Pho office  regarding surgical consultation.  I will also refer her to Dr. Donella Stade to discuss radiation treatment if surgery is not an option  With regards to the right middle and lower lobe nonsegmental PE noted on the CT scan: Patient is currently on Eliquis and I would like her to stay on anticoagulation for 6 months.  If she does have plans to go for surgery she will need minimal interruption of her anticoagulation.  Although recent PE may be preoperative risk factor for undergoing definitive bladder surgery.  Patient is also on aspirin and Plavix for her peripheral vascular disease and I have asked her to hold her aspirin at this time.  We will touch base with DrSaralyn Pilar to manage her aspirin and Plavix in the light of Eliquis use  I will see her back in 1 month's time with a CBC and a CMP.  She will keep her port in place for now and will need that flushed every 6 weeks     Visit Diagnosis 1. 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 Small cell carcinoma of bladder (HCC) Chronic     Dr. Randa Evens, MD, MPH [place] [facility] Pager- [phone] 05/29/2017 1:33 PM

## 2017-05-29 NOTE — Telephone Encounter (Signed)
Per Dr. Elroy Channel request, spoke to patient via telephone and instructed her to continue taking the Eliquis and Plavix but not to take any aspirin, per Dr. Saralyn Pilar' instructions. Patient verbalized understanding.      dhs

## 2017-05-29 NOTE — Telephone Encounter (Unsigned)
Copied from [employer] [phone] [id number]. Topic: Quick Communication - Rx Refill/Question >> May 29, 2017 10:14 AM Arletha Grippe wrote: Medication: potassium cl sr    Has the patient contacted their pharmacy? Yes.  Pharmacy called    (Agent: If no, request that the patient contact the pharmacy for the refill.)   Preferred Pharmacy (with phone number or street name): reference [id number], cb# [phone], [facility]    Agent: Please be advised that RX refills may take up to 3 business days. We ask that you follow-up with your pharmacy.

## 2017-05-29 NOTE — Telephone Encounter (Signed)
Left message for Dr. Saralyn Pilar to advise on new instructions for patient's medications for PAD, specifically that patient was started on Eliquis for a PE and Dr. Janese Banks has her holding her aspirin for now; she is still on her Plavix. Gwinda Passe will give him the message and someone will return the call.        dhs

## 2017-05-30 ENCOUNTER — Other Ambulatory Visit: Payer: Self-pay | Admitting: Internal Medicine

## 2017-05-30 ENCOUNTER — Telehealth: Payer: Self-pay | Admitting: Urology

## 2017-05-30 DIAGNOSIS — E876 Hypokalemia: Secondary | ICD-10-CM

## 2017-05-30 MED ORDER — POTASSIUM CHLORIDE CRYS ER 10 MEQ PO TBCR: 10.0000 meq | EXTENDED_RELEASE_TABLET | Freq: Every day | ORAL | 1 refills | Status: DC

## 2017-05-30 NOTE — Progress Notes (Signed)
Order placed for potassium check.  

## 2017-05-30 NOTE — Telephone Encounter (Signed)
Sorry for the confusion.  It appears that she is not on triam/hctz now.  Not on her list.  Appears to have been stopped recently. If this is correct, she may not need to be on potassium.  Per her note, she received a 30 day supply on 05/21/17.  Since has some, what I would like to do is confirm when the triam/hctz was stopped.  If she has been off for two weeks or more, I would like for her to hold her potassium and recheck potassium in 2 weeks to confirm stable.  If stable, probably will not need potassium check.

## 2017-05-30 NOTE — Telephone Encounter (Signed)
Spoke with [facility]. Latest prescription was written by you. She was sent #30 with no refills on 05/21/2017. Called patient to verify how she was taking. She is taking 10 mEq daily but is requesting that we send in 90-day supply for the next prescription so she can save money.

## 2017-05-30 NOTE — Telephone Encounter (Signed)
Patient scheduled for follow up labs on 06/27/2017. Spoke with you about this.

## 2017-05-30 NOTE — Telephone Encounter (Signed)
Patient has been schedule with Dr. Tresa Moore for a new patient consult in [place] for a possible cystectomy. App 06-21-17 @ 10:30 and she is aware.   Thanks, Sharyn Lull

## 2017-05-30 NOTE — Telephone Encounter (Signed)
Please contact pharmacy and see who has been refilling and when last refilled.  If being refilled by another MD, have them forward request to that MD.  If any questions, will need to confirm with pt she has been taking regularly and dose taking and how often taking.

## 2017-05-30 NOTE — Telephone Encounter (Signed)
Pt off triam/hctz since 09/2016.  Has been on potassium 104meq daily.  Last potassium wnl.  Will recheck in 4 weeks to confirm stable.

## 2017-05-30 NOTE — Progress Notes (Signed)
[facility] sent [facility].  For kcl

## 2017-06-01 ENCOUNTER — Institutional Professional Consult (permissible substitution): Payer: Medicare Other | Admitting: Radiation Oncology

## 2017-06-04 ENCOUNTER — Telehealth: Payer: Self-pay | Admitting: Internal Medicine

## 2017-06-04 ENCOUNTER — Telehealth: Payer: Self-pay | Admitting: *Deleted

## 2017-06-04 ENCOUNTER — Other Ambulatory Visit: Payer: Self-pay | Admitting: *Deleted

## 2017-06-04 MED ORDER — APIXABAN 5 MG PO TABS: 5.0000 mg | ORAL_TABLET | Freq: Two times a day (BID) | ORAL | 0 refills | Status: DC

## 2017-06-04 NOTE — Telephone Encounter (Signed)
Sent refill to [facility] for eliquis.  Pt got a 3 week supply from [facility] but her mail in pharmacy needs 2 weeks to get new supply. Sent it today electronically and it went through per checking transmission status

## 2017-06-04 NOTE — Telephone Encounter (Signed)
Copied from [place] [id number]. Topic: Quick Communication - Rx Refill/Question >> Jun 04, 2017  9:29 AM Aurelio Brash B wrote: Medication: clopidogrel (PLAVIX) 75 MG tablet   Has the patient contacted their pharmacy? Yes   (Agent: If no, request that the patient contact the pharmacy for the refill.)   Preferred Pharmacy (with phone number or street name): [facility], [place], [place] [phone] (Phone) [phone] (Fax)     Agent: Please be advised that RX refills may take up to 3 business days. We ask that you follow-up with your pharmacy.

## 2017-06-05 ENCOUNTER — Ambulatory Visit
Admission: RE | Admit: 2017-06-05 | Discharge: 2017-06-05 | Disposition: A | Discharge status: Home / Self Care | Payer: Medicare Other | Source: Ambulatory Visit | Attending: Radiation Oncology | Admitting: Radiation Oncology

## 2017-06-05 ENCOUNTER — Encounter: Payer: Self-pay | Admitting: Radiation Oncology

## 2017-06-05 ENCOUNTER — Other Ambulatory Visit: Payer: Self-pay

## 2017-06-05 VITALS — BP 177/82 | HR 80 | Temp 99.0°F | Resp 20 | Wt 132.6 lb

## 2017-06-05 DIAGNOSIS — Z79899 Other long term (current) drug therapy: Secondary | ICD-10-CM | POA: Diagnosis not present

## 2017-06-05 DIAGNOSIS — D649 Anemia, unspecified: Secondary | ICD-10-CM | POA: Insufficient documentation

## 2017-06-05 DIAGNOSIS — Z87891 Personal history of nicotine dependence: Secondary | ICD-10-CM | POA: Insufficient documentation

## 2017-06-05 DIAGNOSIS — Z85828 Personal history of other malignant neoplasm of skin: Secondary | ICD-10-CM | POA: Insufficient documentation

## 2017-06-05 DIAGNOSIS — I10 Essential (primary) hypertension: Secondary | ICD-10-CM | POA: Insufficient documentation

## 2017-06-05 DIAGNOSIS — M810 Age-related osteoporosis without current pathological fracture: Secondary | ICD-10-CM | POA: Diagnosis not present

## 2017-06-05 DIAGNOSIS — R918 Other nonspecific abnormal finding of lung field: Secondary | ICD-10-CM | POA: Insufficient documentation

## 2017-06-05 DIAGNOSIS — K639 Disease of intestine, unspecified: Secondary | ICD-10-CM | POA: Diagnosis not present

## 2017-06-05 DIAGNOSIS — E7800 Pure hypercholesterolemia, unspecified: Secondary | ICD-10-CM | POA: Insufficient documentation

## 2017-06-05 DIAGNOSIS — Z86010 Personal history of colonic polyps: Secondary | ICD-10-CM | POA: Diagnosis not present

## 2017-06-05 DIAGNOSIS — K219 Gastro-esophageal reflux disease without esophagitis: Secondary | ICD-10-CM | POA: Insufficient documentation

## 2017-06-05 DIAGNOSIS — C679 Malignant neoplasm of bladder, unspecified: Secondary | ICD-10-CM | POA: Diagnosis not present

## 2017-06-05 DIAGNOSIS — R739 Hyperglycemia, unspecified: Secondary | ICD-10-CM | POA: Insufficient documentation

## 2017-06-05 DIAGNOSIS — Z510 Encounter for antineoplastic radiation therapy: Secondary | ICD-10-CM | POA: Insufficient documentation

## 2017-06-05 DIAGNOSIS — D45 Polycythemia vera: Secondary | ICD-10-CM | POA: Insufficient documentation

## 2017-06-05 DIAGNOSIS — N281 Cyst of kidney, acquired: Secondary | ICD-10-CM | POA: Insufficient documentation

## 2017-06-05 DIAGNOSIS — Z7982 Long term (current) use of aspirin: Secondary | ICD-10-CM | POA: Diagnosis not present

## 2017-06-05 DIAGNOSIS — J449 Chronic obstructive pulmonary disease, unspecified: Secondary | ICD-10-CM | POA: Insufficient documentation

## 2017-06-05 DIAGNOSIS — I2510 Atherosclerotic heart disease of native coronary artery without angina pectoris: Secondary | ICD-10-CM | POA: Insufficient documentation

## 2017-06-05 DIAGNOSIS — Z1501 Genetic susceptibility to malignant neoplasm of breast: Secondary | ICD-10-CM | POA: Diagnosis not present

## 2017-06-05 NOTE — Consult Note (Signed)
NEW PATIENT EVALUATION  Name: Tina [name]  MRN: [id number]  Date:   06/05/2017     DOB: [birth year]   This 83 y.o. 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  REFERRING PHYSICIAN: Einar Pheasant, MD  CHIEF COMPLAINT:  Chief Complaint  Patient presents with  . Bladder Cancer    Pt is here for initial consultation of bladder cancer.     DIAGNOSIS: The encounter diagnosis was Small cell carcinoma of bladder ([place]).   PREVIOUS INVESTIGATIONS:  PET scan and CT scans reviewed Clinical notes reviewed Pathology report reviewed  HPI: Patient is an 83 year old female with history of polycythemia treated with phlebotomy. In October 2018 she was seen in the hospital for symptoms including nausea weight loss and fatigue. She was noted on workup to have irregular wall thickening of the ascending colon as well as a 4.2 x 3.2 cm posterior bladder wall mass and incidental finding. She had a stent placed of her SMA. Colonoscopy showed changes consistent with ischemic colitis. Posterior bladder wall mass was seen on TURBT when bilateral ureteral stents were placed. Pathology showed small cell carcinoma with muscularis propria invasion. She has completed 4 cycles of carboplatin and etoposide on 05/09/2017. She has been doing well since she's had her stents reversed. At that time according to the patient there was no viable tumor remaining She is to see urologist for consideration of radical cystectomy and is now referred to radiation oncology for opinion. She states her does have some frequency and urgency of urination no hematuria.  PLANNED TREATMENT REGIMEN: External beam radiation therapy  PAST MEDICAL HISTORY:  has a past medical history of Anemia, Asthma, Bladder cancer ([place]), BRCA positive, CAD (coronary artery disease), COPD (chronic obstructive pulmonary disease) ([place]), Emphysema lung  ([place]), GERD (gastroesophageal reflux disease), History of colon polyps, Hypercholesterolemia, Hyperglycemia, Hypertension, Lung nodules, Osteoporosis, Personal history of tobacco use, presenting hazards to health (11/25/2014), Polycythemia vera(238.4), Renal cyst, and Skin cancer.    PAST SURGICAL HISTORY:  Past Surgical History:  Procedure Laterality Date  . ANGIOPLASTY     coronary (x1)  . COLONOSCOPY WITH PROPOFOL N/A 03/02/2015   Procedure: COLONOSCOPY WITH PROPOFOL;  Surgeon: Lollie Sails, MD;  Location: [facility] ENDOSCOPY;  Service: Endoscopy;  Laterality: N/A;  . COLONOSCOPY WITH PROPOFOL N/A 02/13/2017   Procedure: COLONOSCOPY WITH PROPOFOL;  Surgeon: [name], MD;  Location: [facility] ENDOSCOPY;  Service: Endoscopy;  Laterality: N/A;  . CORONARY ANGIOPLASTY    . CORONARY ARTERY BYPASS GRAFT    . CYSTOSCOPY W/ RETROGRADES Bilateral 02/13/2017   Procedure: CYSTOSCOPY WITH RETROGRADE PYELOGRAM;  Surgeon: Hollice Espy, MD;  Location: [facility] ORS;  Service: Urology;  Laterality: Bilateral;  . CYSTOSCOPY WITH BIOPSY  03/26/2017   Procedure: CYSTOSCOPY WITH BIOPSY;  Surgeon: Hollice Espy, MD;  Location: [facility] ORS;  Service: Urology;;  . Consuela Mimes WITH STENT PLACEMENT Bilateral 02/13/2017   Procedure: CYSTOSCOPY WITH STENT PLACEMENT;  Surgeon: Hollice Espy, MD;  Location: [facility] ORS;  Service: Urology;  Laterality: Bilateral;  . CYSTOSCOPY WITH STENT PLACEMENT Bilateral 03/26/2017   Procedure: CYSTOSCOPY WITH STENT EXCHANGE;  Surgeon: Hollice Espy, MD;  Location: [facility] ORS;  Service: Urology;  Laterality: Bilateral;  . ESOPHAGOGASTRODUODENOSCOPY (EGD) WITH PROPOFOL N/A 02/13/2017   Procedure: ESOPHAGOGASTRODUODENOSCOPY (EGD) WITH PROPOFOL;  Surgeon: [name], MD;  Location: [facility] ENDOSCOPY;  Service: Endoscopy;  Laterality: N/A;  . PORTA CATH INSERTION N/A 02/28/2017   Procedure:  PORTA CATH INSERTION;  Surgeon: Algernon Huxley, MD;  Location: [facility] LAB;  Service: Cardiovascular;   Laterality: N/A;  . SALPINGOOPHORECTOMY    . TONSILECTOMY/ADENOIDECTOMY WITH MYRINGOTOMY    . TRANSURETHRAL RESECTION OF BLADDER TUMOR N/A 02/13/2017   Procedure: TRANSURETHRAL RESECTION OF BLADDER TUMOR (TURBT);  Surgeon: Hollice Espy, MD;  Location: [facility] ORS;  Service: Urology;  Laterality: N/A;  . TUBAL LIGATION    . UPPER GI ENDOSCOPY  02/13/2017  . VISCERAL ARTERY INTERVENTION N/A 02/12/2017   Procedure: VISCERAL ARTERY INTERVENTION;  Surgeon: Algernon Huxley, MD;  Location: [facility] LAB;  Service: Cardiovascular;  Laterality: N/A;    FAMILY HISTORY: family history includes Alcohol abuse in her father and sister; Asthma in her mother; Breast cancer in her cousin, maternal aunt, and mother; Cirrhosis in her father; Colon cancer in her mother; Congestive Heart Failure in her mother; Lupus in her sister; Osteoarthritis in her mother; Osteoporosis in her sister; Ovarian cancer in her sister; Skin cancer in her sister.  SOCIAL HISTORY:  reports that she quit smoking about 13 years ago. Her smoking use included cigarettes. She has a 45.00 pack-year smoking history. she has never used smokeless tobacco. She reports that she does not drink alcohol or use drugs.  ALLERGIES: Evista [raloxifene] and Fluticasone-salmeterol  MEDICATIONS:  Current Outpatient Medications  Medication Sig Dispense Refill  . albuterol (PROVENTIL HFA;VENTOLIN HFA) 108 (90 Base) MCG/ACT inhaler Inhale 2 puffs into the lungs every 6 (six) hours as needed for wheezing or shortness of breath. 1 Inhaler 2  . amLODipine (NORVASC) 5 MG tablet TAKE 1 TABLET BY MOUTH  DAILY 90 tablet 1  . apixaban (ELIQUIS) 5 MG TABS tablet Take 1 tablet (5 mg total) by mouth 2 (two) times daily. 180 tablet 0  . aspirin EC 81 MG EC tablet Take 1 tablet (81 mg total) by mouth daily. 30 tablet 1  . CALCIUM CARBONATE PO Take 1 tablet by mouth every Monday, Wednesday, and Friday.    . cholecalciferol (VITAMIN D) 1000 units tablet Take 1,000  Units by mouth daily.    . clopidogrel (PLAVIX) 75 MG tablet Take 1 tablet (75 mg total) by mouth daily. 30 tablet 2  . fluticasone (FLONASE) 50 MCG/ACT nasal spray Place 2 sprays into both nostrils daily. (Patient taking differently: Place 2 sprays into both nostrils daily as needed for allergies (congestion). ) 16 g 5  . ipratropium-albuterol (DUONEB) 0.5-2.5 (3) MG/3ML SOLN Take 3 mLs by nebulization every 6 (six) hours as needed (for shortness of breath). 36 mL 0  . lidocaine-prilocaine (EMLA) cream APPLY TOPICALLY TO AFECTED AREA ONCE AS NEEDED PRIOR TO APPOINMENT  3  . losartan (COZAAR) 100 MG tablet TAKE 1 TABLET BY MOUTH  DAILY (Patient taking differently: TAKE '100mg'$  BY MOUTH  DAILY) 90 tablet 1  . lovastatin (MEVACOR) 40 MG tablet Take 1 tablet (40 mg total) by mouth daily. 10 tablet 0  . metoprolol tartrate (LOPRESSOR) 25 MG tablet TAKE ONE-HALF TABLET BY  MOUTH TWO TIMES DAILY (Patient taking differently: TAKE 12.'5mg'$  BY MOUTH TWO TIMES DAILY) 90 tablet 1  . ondansetron (ZOFRAN) 8 MG tablet Take 1 tablet (8 mg total) by mouth 2 (two) times daily as needed for refractory nausea / vomiting. Start on day 3 after carboplatin chemo. 30 tablet 1  . potassium chloride (K-DUR,KLOR-CON) 10 MEQ tablet Take 1 tablet (10 mEq total) by mouth daily. 90 tablet 1  . rosuvastatin (CRESTOR) 20 MG tablet Take 20 mg by  mouth.    . SYMBICORT 160-4.5 MCG/ACT inhaler USE 2 PUFFS TWO TIMES DAILY 30.6 g 1  . prochlorperazine (COMPAZINE) 10 MG tablet Take 1 tablet (10 mg total) by mouth every 6 (six) hours as needed (Nausea or vomiting). (Patient not taking: Reported on 04/24/2017) 30 tablet 1   No current facility-administered medications for this encounter.     ECOG PERFORMANCE STATUS:  0 - Asymptomatic  REVIEW OF SYSTEMS:  Patient denies any weight loss, fatigue, weakness, fever, chills or night sweats. Patient denies any loss of vision, blurred vision. Patient denies any ringing  of the ears or hearing loss.  No irregular heartbeat. Patient denies heart murmur or history of fainting. Patient denies any chest pain or pain radiating to her upper extremities. Patient denies any shortness of breath, difficulty breathing at night, cough or hemoptysis. Patient denies any swelling in the lower legs. Patient denies any nausea vomiting, vomiting of blood, or coffee ground material in the vomitus. Patient denies any stomach pain. Patient states has had normal bowel movements no significant constipation or diarrhea. Patient denies any dysuria, hematuria or significant nocturia. Patient denies any problems walking, swelling in the joints or loss of balance. Patient denies any skin changes, loss of hair or loss of weight. Patient denies any excessive worrying or anxiety or significant depression. Patient denies any problems with insomnia. Patient denies excessive thirst, polyuria, polydipsia. Patient denies any swollen glands, patient denies easy bruising or easy bleeding. Patient denies any recent infections, allergies or URI. Patient "s visual fields have not changed significantly in recent time.   PHYSICAL EXAM: BP (!) 177/82   Pulse 80   Temp 99 F (37.2 C)   Resp 20   Wt 132 lb 9.7 oz (60.2 kg)   BMI 22.76 kg/m  Well-developed well-nourished patient in NAD. HEENT reveals PERLA, EOMI, discs not visualized.  Oral cavity is clear. No oral mucosal lesions are identified. Neck is clear without evidence of cervical or supraclavicular adenopathy. Lungs are clear to A&P. Cardiac examination is essentially unremarkable with regular rate and rhythm without murmur rub or thrill. Abdomen is benign with no organomegaly or masses noted. Motor sensory and DTR levels are equal and symmetric in the upper and lower extremities. Cranial nerves II through XII are grossly intact. Proprioception is intact. No peripheral adenopathy or edema is identified. No motor or sensory levels are noted. Crude visual fields are within normal  range.  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  PLAN: The only real single institution 73 your E retrospective review of small cell bladder cancer was performed at [facility]. None of the treatment options including radical cystectomy versus radiation therapy were superior with respect to survival. I believe based on her age small cell histology I would favor external beam radiation therapy to 5000 cGy to her bladder over 5 weeks. Risks and benefits of treatment including increased lower urinary tract symptoms such as frequency urgency of urination could be expected. Also possible diarrhea fatigue alteration of blood counts skin reaction. She is set up to see urology for discussion of radical cystectomy. I will discuss the case personally with medical oncology. I've tentatively set her up for CT simulation next week. Patient comprehend my treatment plan well. She does verbalize that she does not want radical surgery.  I would like  to take this opportunity to thank you for allowing me to participate in the care of your patient.Noreene Filbert, MD

## 2017-06-11 ENCOUNTER — Other Ambulatory Visit: Payer: Self-pay | Admitting: *Deleted

## 2017-06-12 ENCOUNTER — Telehealth: Payer: Self-pay | Admitting: *Deleted

## 2017-06-12 ENCOUNTER — Other Ambulatory Visit: Payer: Self-pay | Admitting: *Deleted

## 2017-06-12 ENCOUNTER — Ambulatory Visit
Admission: RE | Admit: 2017-06-12 | Discharge: 2017-06-12 | Disposition: A | Discharge status: Home / Self Care | Payer: Medicare Other | Source: Ambulatory Visit | Attending: Radiation Oncology | Admitting: Radiation Oncology

## 2017-06-12 DIAGNOSIS — I2510 Atherosclerotic heart disease of native coronary artery without angina pectoris: Secondary | ICD-10-CM | POA: Diagnosis not present

## 2017-06-12 DIAGNOSIS — D45 Polycythemia vera: Secondary | ICD-10-CM | POA: Diagnosis not present

## 2017-06-12 DIAGNOSIS — D649 Anemia, unspecified: Secondary | ICD-10-CM | POA: Diagnosis not present

## 2017-06-12 DIAGNOSIS — C679 Malignant neoplasm of bladder, unspecified: Secondary | ICD-10-CM | POA: Diagnosis not present

## 2017-06-12 DIAGNOSIS — K639 Disease of intestine, unspecified: Secondary | ICD-10-CM | POA: Diagnosis not present

## 2017-06-12 DIAGNOSIS — Z510 Encounter for antineoplastic radiation therapy: Secondary | ICD-10-CM | POA: Diagnosis not present

## 2017-06-12 NOTE — Telephone Encounter (Signed)
Pt states that the mail in rx pharmacy says that it is too early.  She wants me to call [facility] and get her rx eliquis to cover her until it is mailed in. I told her I would call and check with [facility]  Mail in pharmacy first. I called and was told that I will have to do override to get rx sent sooner. When I called ad spoke to customer service I spoke to [facility] and I needed to do PA and then be trf to do PA. I spoke to a female there and she overide the rx and spoke to pharmacy and said they will have it delivered to her house by Friday of this week and at latest sat.  I then called pt. And she states they will have to call her to make payment. I told her maybe she should call tom. Afternoon and she states they are good to get her meds to her and she feels it will be taken care of like they usually do.

## 2017-06-14 ENCOUNTER — Telehealth: Payer: Self-pay | Admitting: *Deleted

## 2017-06-14 ENCOUNTER — Encounter: Payer: Self-pay | Admitting: Internal Medicine

## 2017-06-14 NOTE — Telephone Encounter (Signed)
Asked to call back because pt is on plavix as well as eliquis and it inc. Her risk of bleeding. Called back and let pharmacist know that pt was on plavix and when she got the pe the md wanted to check with cardiologist before starting her on eliquis . We called Dr. Saralyn Pilar office and he states that she will need to be on both, he felt strongly that she needs to continue with plavix and Dr. Janese Banks could add the eliquis on

## 2017-06-15 ENCOUNTER — Other Ambulatory Visit: Payer: Self-pay | Admitting: *Deleted

## 2017-06-15 DIAGNOSIS — C61 Malignant neoplasm of prostate: Secondary | ICD-10-CM

## 2017-06-19 ENCOUNTER — Ambulatory Visit: Payer: Medicare Other

## 2017-06-20 ENCOUNTER — Ambulatory Visit
Admission: RE | Admit: 2017-06-20 | Discharge: 2017-06-20 | Disposition: A | Discharge status: Home / Self Care | Payer: Medicare Other | Source: Ambulatory Visit | Attending: Radiation Oncology | Admitting: Radiation Oncology

## 2017-06-20 ENCOUNTER — Ambulatory Visit: Payer: Medicare Other

## 2017-06-20 ENCOUNTER — Telehealth: Payer: Self-pay

## 2017-06-20 DIAGNOSIS — D649 Anemia, unspecified: Secondary | ICD-10-CM | POA: Diagnosis not present

## 2017-06-20 DIAGNOSIS — I2510 Atherosclerotic heart disease of native coronary artery without angina pectoris: Secondary | ICD-10-CM | POA: Diagnosis not present

## 2017-06-20 DIAGNOSIS — D45 Polycythemia vera: Secondary | ICD-10-CM | POA: Diagnosis not present

## 2017-06-20 DIAGNOSIS — C679 Malignant neoplasm of bladder, unspecified: Secondary | ICD-10-CM | POA: Diagnosis not present

## 2017-06-20 DIAGNOSIS — K639 Disease of intestine, unspecified: Secondary | ICD-10-CM | POA: Diagnosis not present

## 2017-06-20 DIAGNOSIS — Z510 Encounter for antineoplastic radiation therapy: Secondary | ICD-10-CM | POA: Diagnosis not present

## 2017-06-20 NOTE — Telephone Encounter (Signed)
Informed Lake Bells (her son) that Ms Cuda did not need the f/u lab we ordered.  This was a potassium check and this has been rechecked and ok.  Also, they have labs scheduled for her for f/u.

## 2017-06-20 NOTE — Telephone Encounter (Signed)
Copied from [place] [id number]. Topic: Inquiry >> Jun 20, 2017 10:43 AM Oliver Pila B wrote: Reason for CRM: pt called to ask about if her labs could be sent over to [facility] @ [facility], b/c she is undergoing radiation treatment @ the cancer center and she could just go over to the hospital to do labs then go straight to radiation, contact pt to advise

## 2017-06-20 NOTE — Telephone Encounter (Signed)
Can we change orders?

## 2017-06-21 ENCOUNTER — Ambulatory Visit: Payer: Medicare Other

## 2017-06-21 ENCOUNTER — Other Ambulatory Visit: Payer: Self-pay | Admitting: Internal Medicine

## 2017-06-21 ENCOUNTER — Ambulatory Visit
Admission: RE | Admit: 2017-06-21 | Discharge: 2017-06-21 | Disposition: A | Discharge status: Home / Self Care | Payer: Medicare Other | Source: Ambulatory Visit | Attending: Radiation Oncology | Admitting: Radiation Oncology

## 2017-06-21 DIAGNOSIS — D649 Anemia, unspecified: Secondary | ICD-10-CM | POA: Diagnosis not present

## 2017-06-21 DIAGNOSIS — D45 Polycythemia vera: Secondary | ICD-10-CM | POA: Diagnosis not present

## 2017-06-21 DIAGNOSIS — Z510 Encounter for antineoplastic radiation therapy: Secondary | ICD-10-CM | POA: Diagnosis not present

## 2017-06-21 DIAGNOSIS — K639 Disease of intestine, unspecified: Secondary | ICD-10-CM | POA: Diagnosis not present

## 2017-06-21 DIAGNOSIS — C679 Malignant neoplasm of bladder, unspecified: Secondary | ICD-10-CM | POA: Diagnosis not present

## 2017-06-21 DIAGNOSIS — I2510 Atherosclerotic heart disease of native coronary artery without angina pectoris: Secondary | ICD-10-CM | POA: Diagnosis not present

## 2017-06-21 NOTE — Telephone Encounter (Signed)
Lovastatin 40 mg and Clopidogrel 75 mg  refill Last OV: 01/2017 Last Refill:Lovastatin 40 mg last filled 05/03/17; Clopidogrel 75 mg last filled by another provider [facility]acy[facility] [phone]

## 2017-06-21 NOTE — Telephone Encounter (Signed)
Copied from [place]. Topic: General - Other >> Jun 21, 2017  5:41 PM Neva Seat wrote: Clopidogrel 75 mg  11 on hand    [place] [place] [address] Phone: [phone] Fax: [phone]

## 2017-06-22 ENCOUNTER — Ambulatory Visit
Admission: RE | Admit: 2017-06-22 | Discharge: 2017-06-22 | Disposition: A | Discharge status: Home / Self Care | Payer: Medicare Other | Source: Ambulatory Visit | Attending: Radiation Oncology | Admitting: Radiation Oncology

## 2017-06-22 ENCOUNTER — Ambulatory Visit: Payer: Medicare Other

## 2017-06-22 DIAGNOSIS — I2510 Atherosclerotic heart disease of native coronary artery without angina pectoris: Secondary | ICD-10-CM | POA: Diagnosis not present

## 2017-06-22 DIAGNOSIS — D649 Anemia, unspecified: Secondary | ICD-10-CM | POA: Diagnosis not present

## 2017-06-22 DIAGNOSIS — Z510 Encounter for antineoplastic radiation therapy: Secondary | ICD-10-CM | POA: Diagnosis not present

## 2017-06-22 DIAGNOSIS — K639 Disease of intestine, unspecified: Secondary | ICD-10-CM | POA: Diagnosis not present

## 2017-06-22 DIAGNOSIS — D45 Polycythemia vera: Secondary | ICD-10-CM | POA: Diagnosis not present

## 2017-06-22 DIAGNOSIS — C679 Malignant neoplasm of bladder, unspecified: Secondary | ICD-10-CM | POA: Diagnosis not present

## 2017-06-22 NOTE — Telephone Encounter (Signed)
Please advise 

## 2017-06-25 ENCOUNTER — Ambulatory Visit: Payer: Medicare Other

## 2017-06-25 ENCOUNTER — Ambulatory Visit
Admission: RE | Admit: 2017-06-25 | Discharge: 2017-06-25 | Disposition: A | Discharge status: Home / Self Care | Payer: Medicare Other | Source: Ambulatory Visit | Attending: Radiation Oncology | Admitting: Radiation Oncology

## 2017-06-25 DIAGNOSIS — Z510 Encounter for antineoplastic radiation therapy: Secondary | ICD-10-CM | POA: Diagnosis not present

## 2017-06-25 DIAGNOSIS — C679 Malignant neoplasm of bladder, unspecified: Secondary | ICD-10-CM | POA: Diagnosis not present

## 2017-06-25 DIAGNOSIS — K639 Disease of intestine, unspecified: Secondary | ICD-10-CM | POA: Diagnosis not present

## 2017-06-25 DIAGNOSIS — D45 Polycythemia vera: Secondary | ICD-10-CM | POA: Diagnosis not present

## 2017-06-25 DIAGNOSIS — D649 Anemia, unspecified: Secondary | ICD-10-CM | POA: Diagnosis not present

## 2017-06-25 DIAGNOSIS — I2510 Atherosclerotic heart disease of native coronary artery without angina pectoris: Secondary | ICD-10-CM | POA: Diagnosis not present

## 2017-06-26 ENCOUNTER — Other Ambulatory Visit: Payer: Self-pay

## 2017-06-26 ENCOUNTER — Inpatient Hospital Stay: Payer: Medicare Other | Attending: Oncology

## 2017-06-26 ENCOUNTER — Inpatient Hospital Stay (HOSPITAL_BASED_OUTPATIENT_CLINIC_OR_DEPARTMENT_OTHER): Payer: Medicare Other | Admitting: Oncology

## 2017-06-26 ENCOUNTER — Ambulatory Visit: Payer: Medicare Other

## 2017-06-26 ENCOUNTER — Ambulatory Visit
Admission: RE | Admit: 2017-06-26 | Discharge: 2017-06-26 | Disposition: A | Discharge status: Home / Self Care | Payer: Medicare Other | Source: Ambulatory Visit | Attending: Radiation Oncology | Admitting: Radiation Oncology

## 2017-06-26 VITALS — BP 221/72 | HR 79 | Temp 97.9°F | Resp 18 | Wt 132.9 lb

## 2017-06-26 DIAGNOSIS — Z86711 Personal history of pulmonary embolism: Secondary | ICD-10-CM | POA: Diagnosis not present

## 2017-06-26 DIAGNOSIS — Z803 Family history of malignant neoplasm of breast: Secondary | ICD-10-CM

## 2017-06-26 DIAGNOSIS — C679 Malignant neoplasm of bladder, unspecified: Secondary | ICD-10-CM | POA: Insufficient documentation

## 2017-06-26 DIAGNOSIS — D751 Secondary polycythemia: Secondary | ICD-10-CM

## 2017-06-26 DIAGNOSIS — Z8041 Family history of malignant neoplasm of ovary: Secondary | ICD-10-CM | POA: Insufficient documentation

## 2017-06-26 DIAGNOSIS — K639 Disease of intestine, unspecified: Secondary | ICD-10-CM | POA: Diagnosis not present

## 2017-06-26 DIAGNOSIS — Z87891 Personal history of nicotine dependence: Secondary | ICD-10-CM | POA: Diagnosis not present

## 2017-06-26 DIAGNOSIS — Z7901 Long term (current) use of anticoagulants: Secondary | ICD-10-CM | POA: Insufficient documentation

## 2017-06-26 DIAGNOSIS — I2699 Other pulmonary embolism without acute cor pulmonale: Secondary | ICD-10-CM

## 2017-06-26 DIAGNOSIS — D45 Polycythemia vera: Secondary | ICD-10-CM | POA: Diagnosis not present

## 2017-06-26 DIAGNOSIS — Z95828 Presence of other vascular implants and grafts: Secondary | ICD-10-CM

## 2017-06-26 DIAGNOSIS — D649 Anemia, unspecified: Secondary | ICD-10-CM | POA: Diagnosis not present

## 2017-06-26 DIAGNOSIS — Z510 Encounter for antineoplastic radiation therapy: Secondary | ICD-10-CM | POA: Diagnosis not present

## 2017-06-26 DIAGNOSIS — I2510 Atherosclerotic heart disease of native coronary artery without angina pectoris: Secondary | ICD-10-CM | POA: Diagnosis not present

## 2017-06-26 LAB — CBC WITH DIFFERENTIAL/PLATELET
BASOS ABS: 0.1 10*3/uL (ref 0–0.1)
BASOS PCT: 1 %
EOS ABS: 0.7 10*3/uL (ref 0–0.7)
Eosinophils Relative: 7 %
HEMATOCRIT: 44.1 % (ref 35.0–47.0)
Hemoglobin: 14.8 g/dL (ref 12.0–16.0)
Lymphocytes Relative: 21 %
Lymphs Abs: 2.1 10*3/uL (ref 1.0–3.6)
MCH: 32.6 pg (ref 26.0–34.0)
MCHC: 33.4 g/dL (ref 32.0–36.0)
MCV: 97.6 fL (ref 80.0–100.0)
MONOS PCT: 6 %
Monocytes Absolute: 0.6 10*3/uL (ref 0.2–0.9)
Neutro Abs: 6.5 10*3/uL (ref 1.4–6.5)
Neutrophils Relative %: 65 %
Platelets: 167 10*3/uL (ref 150–440)
RBC: 4.52 MIL/uL (ref 3.80–5.20)
RDW: 13.7 % (ref 11.5–14.5)
WBC: 10 10*3/uL (ref 3.6–11.0)

## 2017-06-26 LAB — COMPREHENSIVE METABOLIC PANEL
ALK PHOS: 74 U/L (ref 38–126)
ALT: 13 U/L — AB (ref 14–54)
ANION GAP: 8 (ref 5–15)
AST: 20 U/L (ref 15–41)
Albumin: 4.3 g/dL (ref 3.5–5.0)
BILIRUBIN TOTAL: 0.7 mg/dL (ref 0.3–1.2)
BUN: 20 mg/dL (ref 6–20)
CALCIUM: 9.5 mg/dL (ref 8.9–10.3)
CO2: 26 mmol/L (ref 22–32)
CREATININE: 0.91 mg/dL (ref 0.44–1.00)
Chloride: 107 mmol/L (ref 101–111)
GFR, EST NON AFRICAN AMERICAN: 58 mL/min — AB (ref >60)
Glucose, Bld: 126 mg/dL — ABNORMAL HIGH (ref 65–99)
Potassium: 3.7 mmol/L (ref 3.5–5.1)
Sodium: 141 mmol/L (ref 135–145)
TOTAL PROTEIN: 6.9 g/dL (ref 6.5–8.1)

## 2017-06-26 MED ORDER — SODIUM CHLORIDE 0.9% FLUSH
10.0000 mL | INTRAVENOUS | Status: AC | PRN
  Administered 2017-06-26: 10 mL
  Filled 2017-06-26: qty 10

## 2017-06-26 MED ORDER — HEPARIN SOD (PORK) LOCK FLUSH 100 UNIT/ML IV SOLN
500.0000 [IU] | INTRAVENOUS | Status: AC | PRN
  Administered 2017-06-26: 500 [IU]

## 2017-06-26 NOTE — Progress Notes (Signed)
Recheck b/p :160 / 90 ( manual)

## 2017-06-26 NOTE — Progress Notes (Signed)
Here for follow up  Ten min after first BP check ( @ 1415 )-bp was 190/77 ,p 66  Pt denies headache or light headedness -took all meds this am " Ive always had high BP " per pt    Will inform Dr Janese Banks

## 2017-06-27 ENCOUNTER — Ambulatory Visit: Payer: Medicare Other

## 2017-06-27 ENCOUNTER — Ambulatory Visit
Admission: RE | Admit: 2017-06-27 | Discharge: 2017-06-27 | Disposition: A | Discharge status: Home / Self Care | Payer: Medicare Other | Source: Ambulatory Visit | Attending: Radiation Oncology | Admitting: Radiation Oncology

## 2017-06-27 ENCOUNTER — Other Ambulatory Visit: Payer: Medicare Other

## 2017-06-27 DIAGNOSIS — D45 Polycythemia vera: Secondary | ICD-10-CM | POA: Diagnosis not present

## 2017-06-27 DIAGNOSIS — K639 Disease of intestine, unspecified: Secondary | ICD-10-CM | POA: Diagnosis not present

## 2017-06-27 DIAGNOSIS — D649 Anemia, unspecified: Secondary | ICD-10-CM | POA: Diagnosis not present

## 2017-06-27 DIAGNOSIS — I2510 Atherosclerotic heart disease of native coronary artery without angina pectoris: Secondary | ICD-10-CM | POA: Diagnosis not present

## 2017-06-27 DIAGNOSIS — C679 Malignant neoplasm of bladder, unspecified: Secondary | ICD-10-CM | POA: Diagnosis not present

## 2017-06-27 DIAGNOSIS — Z510 Encounter for antineoplastic radiation therapy: Secondary | ICD-10-CM | POA: Diagnosis not present

## 2017-06-28 ENCOUNTER — Encounter: Payer: Self-pay | Admitting: Oncology

## 2017-06-28 ENCOUNTER — Ambulatory Visit: Payer: Medicare Other

## 2017-06-28 ENCOUNTER — Ambulatory Visit
Admission: RE | Admit: 2017-06-28 | Discharge: 2017-06-28 | Disposition: A | Discharge status: Home / Self Care | Payer: Medicare Other | Source: Ambulatory Visit | Attending: Radiation Oncology | Admitting: Radiation Oncology

## 2017-06-28 DIAGNOSIS — I2699 Other pulmonary embolism without acute cor pulmonale: Secondary | ICD-10-CM | POA: Insufficient documentation

## 2017-06-28 DIAGNOSIS — Z510 Encounter for antineoplastic radiation therapy: Secondary | ICD-10-CM | POA: Diagnosis not present

## 2017-06-28 DIAGNOSIS — K639 Disease of intestine, unspecified: Secondary | ICD-10-CM | POA: Diagnosis not present

## 2017-06-28 DIAGNOSIS — C679 Malignant neoplasm of bladder, unspecified: Secondary | ICD-10-CM | POA: Diagnosis not present

## 2017-06-28 DIAGNOSIS — D45 Polycythemia vera: Secondary | ICD-10-CM | POA: Diagnosis not present

## 2017-06-28 DIAGNOSIS — D649 Anemia, unspecified: Secondary | ICD-10-CM | POA: Diagnosis not present

## 2017-06-28 DIAGNOSIS — I2510 Atherosclerotic heart disease of native coronary artery without angina pectoris: Secondary | ICD-10-CM | POA: Diagnosis not present

## 2017-06-28 NOTE — Progress Notes (Signed)
Hematology/Oncology Consult note [facility]  Telephone:([phone] [phone]  Patient Care Team: Einar Pheasant, MD as PCP - General (Internal Medicine) Clent Jacks, RN as Registered Nurse   Name of the patient: Tina [name]  [id number]  February 26, 1935   Date of visit: 06/28/17  Diagnosis-1. 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 She has had jak 2 testing as well as EPO levels in the past which was unremarkable. She gets a phlebotomy when her hematocrit is more than 50. She was last seen by me in June 2018. She also has a history of lung nodules and was getting low-dose lung cancer screening scans on 07/04/2014 when her insurance stopped paying.   She recently presented to the hospital in October 2018 with symptoms of nausea and diarrhea weight loss and fatigue. She was noted to have irregular wall thickening in the ascending colon suspicious for colon cancer as well as 4.2 x 3.2 cm posterior bladder wall mass that was incidentally noted. She was also seen by vascular surgery and underwent abdominal angiogram with stent placement at the [facility]. She is on aspirin and Plavix for the same. Patient underwent colonoscopy for concerns of ischemic colitis which showed irregular thickening of the descending colon consistent with ischemic colitis. EGD showed a small hiatal hernia. Losing gastric ulcers were noted which was treated with argon photocoagulation.  With regards to the posterior bladder wall mass she was seen by Dr. Erlene Quan and underwent TURBT with bilateral ureteral stent placement on 02/13/2017. Pathology showed small cell carcinoma with muscularis propria invasion. 4 cycles of carbo/etoposide completed on 12/261/8   Interval  history- Patient saw both urology and rad onc to discuss surgery versus definitive radiation following chemotherapy and has decided to pursue radiation treatment.  She is starting radiation about a week ago and is tolerating it well.  She will be getting total 28 fractions.  He reports doing well and denies any symptoms of fatigue.  She does have mild chronic lower extremity edema which is stable.  She is on Eliquis for her PE and tolerating it well without any symptoms of bleeding  ECOG PS- 0 Pain scale- 0   Review of systems- Review of Systems  Constitutional: Negative for chills, fever, malaise/fatigue and weight loss.  HENT: Negative for congestion, ear discharge and nosebleeds.   Eyes: Negative for blurred vision.  Respiratory: Negative for cough, hemoptysis, sputum production, shortness of breath and wheezing.   Cardiovascular: Negative for chest pain, palpitations, orthopnea and claudication.  Gastrointestinal: Negative for abdominal pain, blood in stool, constipation, diarrhea, heartburn, melena, nausea and vomiting.  Genitourinary: Negative for dysuria, flank pain, frequency, hematuria and urgency.  Musculoskeletal: Negative for back pain, joint pain and myalgias.  Skin: Negative for rash.  Neurological: Negative for dizziness, tingling, focal weakness, seizures, weakness and headaches.  Endo/Heme/Allergies: Does not bruise/bleed easily.  Psychiatric/Behavioral: Negative for depression and suicidal ideas. The patient does not have insomnia.      Allergies  Allergen Reactions  . Evista [Raloxifene] Other (See Comments)    Night sweats   . Fluticasone-Salmeterol Other (See Comments)    Cough, "chokes me"      Past Medical History:  Diagnosis Date  . Anemia   . Asthma   . Bladder cancer ([place])   .  BRCA positive   . CAD (coronary artery disease)   . COPD (chronic obstructive pulmonary disease) ([place])   . Emphysema lung ([place])   . GERD (gastroesophageal reflux disease)   .  History of colon polyps   . Hypercholesterolemia   . Hyperglycemia   . Hypertension   . Lung nodules   . Osteoporosis   . Personal history of tobacco use, presenting hazards to health 11/25/2014  . Polycythemia vera(238.4)   . Renal cyst   . Skin cancer      Past Surgical History:  Procedure Laterality Date  . ANGIOPLASTY     coronary (x1)  . COLONOSCOPY WITH PROPOFOL N/A 03/02/2015   Procedure: COLONOSCOPY WITH PROPOFOL;  Surgeon: Lollie Sails, MD;  Location: [facility] ENDOSCOPY;  Service: Endoscopy;  Laterality: N/A;  . COLONOSCOPY WITH PROPOFOL N/A 02/13/2017   Procedure: COLONOSCOPY WITH PROPOFOL;  Surgeon: [name], MD;  Location: [facility] ENDOSCOPY;  Service: Endoscopy;  Laterality: N/A;  . CORONARY ANGIOPLASTY    . CORONARY ARTERY BYPASS GRAFT    . CYSTOSCOPY W/ RETROGRADES Bilateral 02/13/2017   Procedure: CYSTOSCOPY WITH RETROGRADE PYELOGRAM;  Surgeon: Hollice Espy, MD;  Location: [facility] ORS;  Service: Urology;  Laterality: Bilateral;  . CYSTOSCOPY WITH BIOPSY  03/26/2017   Procedure: CYSTOSCOPY WITH BIOPSY;  Surgeon: Hollice Espy, MD;  Location: [facility] ORS;  Service: Urology;;  . Consuela Mimes WITH STENT PLACEMENT Bilateral 02/13/2017   Procedure: CYSTOSCOPY WITH STENT PLACEMENT;  Surgeon: Hollice Espy, MD;  Location: [facility] ORS;  Service: Urology;  Laterality: Bilateral;  . CYSTOSCOPY WITH STENT PLACEMENT Bilateral 03/26/2017   Procedure: CYSTOSCOPY WITH STENT EXCHANGE;  Surgeon: Hollice Espy, MD;  Location: [facility] ORS;  Service: Urology;  Laterality: Bilateral;  . ESOPHAGOGASTRODUODENOSCOPY (EGD) WITH PROPOFOL N/A 02/13/2017   Procedure: ESOPHAGOGASTRODUODENOSCOPY (EGD) WITH PROPOFOL;  Surgeon: [name], MD;  Location[facility] ENDOSCOPY;  Service: Endoscopy;  Laterality: N/A;  . PORTA CATH INSERTION N/A 02/28/2017   Procedure: PORTA CATH INSERTION;  Surgeon: Algernon Huxley, MD;  Location: [facility] CV LAB;  Service: Cardiovascular;  Laterality: N/A;  .  SALPINGOOPHORECTOMY    . TONSILECTOMY/ADENOIDECTOMY WITH MYRINGOTOMY    . TRANSURETHRAL RESECTION OF BLADDER TUMOR N/A 02/13/2017   Procedure: TRANSURETHRAL RESECTION OF BLADDER TUMOR (TURBT);  Surgeon: Hollice Espy, MD;  Location: [facility] ORS;  Service: Urology;  Laterality: N/A;  . TUBAL LIGATION    . UPPER GI ENDOSCOPY  02/13/2017  . VISCERAL ARTERY INTERVENTION N/A 02/12/2017   Procedure: VISCERAL ARTERY INTERVENTION;  Surgeon: Algernon Huxley, MD;  Location: [facility] LAB;  Service: Cardiovascular;  Laterality: N/A;    Social History   Socioeconomic History  . Marital status: Single    Spouse name: Not on file  . Number of children: 2  . Years of education: Not on file  . Highest education level: Not on file  Social Needs  . Financial resource strain: Not on file  . Food insecurity - worry: Not on file  . Food insecurity - inability: Not on file  . Transportation needs - medical: Not on file  . Transportation needs - non-medical: Not on file  Occupational History  . Not on file  Tobacco Use  . Smoking status: Former Smoker    Packs/day: 1.00    Years: 45.00    Pack years: 45.00    Types: Cigarettes    Last attempt to quit: 11/11/2003    Years since quitting: 13.6  . Smokeless tobacco: Never Used  Substance and Sexual Activity  .  Alcohol use: No    Alcohol/week: 0.0 oz    Comment: occasional  . Drug use: No  . Sexual activity: No  Other Topics Concern  . Not on file  Social History Narrative  . Not on file    Family History  Problem Relation Age of Onset  . Cirrhosis Father        died age 38  . Alcohol abuse Father   . Asthma Mother   . Congestive Heart Failure Mother   . Breast cancer Mother        2 (1/2 sisters)  . Osteoarthritis Mother   . Colon cancer Mother   . Lupus Sister   . Alcohol abuse Sister   . Ovarian cancer Sister   . Osteoporosis Sister   . Skin cancer Sister   . Breast cancer Cousin   . Breast cancer Maternal Aunt       Current Outpatient Medications:  .  albuterol (PROVENTIL HFA;VENTOLIN HFA) 108 (90 Base) MCG/ACT inhaler, Inhale 2 puffs into the lungs every 6 (six) hours as needed for wheezing or shortness of breath., Disp: 1 Inhaler, Rfl: 2 .  amLODipine (NORVASC) 5 MG tablet, TAKE 1 TABLET BY MOUTH  DAILY, Disp: 90 tablet, Rfl: 1 .  apixaban (ELIQUIS) 5 MG TABS tablet, Take 1 tablet (5 mg total) by mouth 2 (two) times daily., Disp: 180 tablet, Rfl: 0 .  CALCIUM CARBONATE PO, Take 1 tablet by mouth every Monday, Wednesday, and Friday., Disp: , Rfl:  .  cholecalciferol (VITAMIN D) 1000 units tablet, Take 1,000 Units by mouth daily., Disp: , Rfl:  .  clopidogrel (PLAVIX) 75 MG tablet, Take 1 tablet (75 mg total) by mouth daily., Disp: 30 tablet, Rfl: 2 .  ipratropium-albuterol (DUONEB) 0.5-2.5 (3) MG/3ML SOLN, Take 3 mLs by nebulization every 6 (six) hours as needed (for shortness of breath)., Disp: 36 mL, Rfl: 0 .  losartan (COZAAR) 100 MG tablet, TAKE 1 TABLET BY MOUTH  DAILY (Patient taking differently: TAKE 145m BY MOUTH  DAILY), Disp: 90 tablet, Rfl: 1 .  lovastatin (MEVACOR) 40 MG tablet, TAKE 1 TABLET BY MOUTH  DAILY, Disp: 90 tablet, Rfl: 0 .  metoprolol tartrate (LOPRESSOR) 25 MG tablet, TAKE ONE-HALF TABLET BY  MOUTH TWO TIMES DAILY (Patient taking differently: TAKE 12.561mBY MOUTH TWO TIMES DAILY), Disp: 90 tablet, Rfl: 1 .  potassium chloride (K-DUR,KLOR-CON) 10 MEQ tablet, Take 1 tablet (10 mEq total) by mouth daily., Disp: 90 tablet, Rfl: 1 .  rosuvastatin (CRESTOR) 20 MG tablet, Take 20 mg by mouth., Disp: , Rfl:  .  SYMBICORT 160-4.5 MCG/ACT inhaler, USE 2 PUFFS TWO TIMES DAILY, Disp: 30.6 g, Rfl: 1 .  aspirin EC 81 MG EC tablet, Take 1 tablet (81 mg total) by mouth daily. (Patient not taking: Reported on 06/26/2017), Disp: 30 tablet, Rfl: 1 .  fluticasone (FLONASE) 50 MCG/ACT nasal spray, Place 2 sprays into both nostrils daily. (Patient not taking: Reported on 06/26/2017), Disp: 16 g, Rfl:  5 .  lidocaine-prilocaine (EMLA) cream, APPLY TOPICALLY TO AFECTED AREA ONCE AS NEEDED PRIOR TO APPOINMENT, Disp: , Rfl: 3 .  ondansetron (ZOFRAN) 8 MG tablet, Take 1 tablet (8 mg total) by mouth 2 (two) times daily as needed for refractory nausea / vomiting. Start on day 3 after carboplatin chemo. (Patient not taking: Reported on 06/26/2017), Disp: 30 tablet, Rfl: 1 .  prochlorperazine (COMPAZINE) 10 MG tablet, Take 1 tablet (10 mg total) by mouth every 6 (six) hours as needed (Nausea  or vomiting). (Patient not taking: Reported on 04/24/2017), Disp: 30 tablet, Rfl: 1  Physical exam:  Vitals:   06/26/17 1409  BP: (!) 221/72  Pulse: 79  Resp: 18  Temp: 97.9 F (36.6 C)  TempSrc: Tympanic  Weight: 132 lb 14.4 oz (60.3 kg)   Physical Exam  Constitutional: She is oriented to person, place, and time.  Thin elderly female in no acute distress  HENT:  Head: Normocephalic and atraumatic.  Eyes: EOM are normal. Pupils are equal, round, and reactive to light.  Neck: Normal range of motion.  Cardiovascular: Normal rate, regular rhythm and normal heart sounds.  Pulmonary/Chest: Effort normal and breath sounds normal.  Abdominal: Soft. Bowel sounds are normal.  Musculoskeletal: She exhibits edema.  Neurological: She is alert and oriented to person, place, and time.  Skin: Skin is warm and dry.     CMP Latest Ref Rng & Units 06/26/2017  Glucose 65 - 99 mg/dL 126(H)  BUN 6 - 20 mg/dL 20  Creatinine 0.44 - 1.00 mg/dL 0.91  Sodium 135 - 145 mmol/L 141  Potassium 3.5 - 5.1 mmol/L 3.7  Chloride 101 - 111 mmol/L 107  CO2 22 - 32 mmol/L 26  Calcium 8.9 - 10.3 mg/dL 9.5  Total Protein 6.5 - 8.1 g/dL 6.9  Total Bilirubin 0.3 - 1.2 mg/dL 0.7  Alkaline Phos 38 - 126 U/L 74  AST 15 - 41 U/L 20  ALT 14 - 54 U/L 13(L)   CBC Latest Ref Rng & Units 06/26/2017  WBC 3.6 - 11.0 K/uL 10.0  Hemoglobin 12.0 - 16.0 g/dL 14.8  Hematocrit 35.0 - 47.0 % 44.1  Platelets 150 - 440 K/uL 167     Assessment  and plan- Patient is a 83 y.o. female with the following medical issues:  1.  Non-metastatic small cell carcinoma of the bladder: Status post 4 cycles of carbo etoposide with resolution of the bladder mass.  She is now getting definitive radiation treatment and will finish it in the next 5 weeks.  I will plan to see her 2 months from now with repeat CT chest abdomen and pelvis with contrast.  CBC and CMP prior  2.  Acute PE: Incidentally found when she had CT chest done after chemotherapy.  She is currently on Eliquis for the same.  She will continue Eliquis for a total of 6 months and then stop it  3.  Uncontrolled hypertension: On repeat check her systolic blood pressure was 160.  Patient is asymptomatic and denies any symptoms of chest pain dizziness or focal numbness or weakness.  Reports that her blood pressure normally tends to run high.  We will touch base with her primary care provider regarding this.   Visit Diagnosis 1. Small cell carcinoma of bladder (HCC)   2. Other acute pulmonary embolism without acute cor pulmonale (HCC)      Dr. Randa Evens, MD, MPH [place] at [facility] Pager- [phone] 06/28/2017 9:44 AM

## 2017-06-29 ENCOUNTER — Ambulatory Visit: Payer: Medicare Other

## 2017-06-29 ENCOUNTER — Ambulatory Visit
Admission: RE | Admit: 2017-06-29 | Discharge: 2017-06-29 | Disposition: A | Discharge status: Home / Self Care | Payer: Medicare Other | Source: Ambulatory Visit | Attending: Radiation Oncology | Admitting: Radiation Oncology

## 2017-06-29 DIAGNOSIS — D45 Polycythemia vera: Secondary | ICD-10-CM | POA: Diagnosis not present

## 2017-06-29 DIAGNOSIS — K639 Disease of intestine, unspecified: Secondary | ICD-10-CM | POA: Diagnosis not present

## 2017-06-29 DIAGNOSIS — C679 Malignant neoplasm of bladder, unspecified: Secondary | ICD-10-CM | POA: Diagnosis not present

## 2017-06-29 DIAGNOSIS — D649 Anemia, unspecified: Secondary | ICD-10-CM | POA: Diagnosis not present

## 2017-06-29 DIAGNOSIS — Z510 Encounter for antineoplastic radiation therapy: Secondary | ICD-10-CM | POA: Diagnosis not present

## 2017-06-29 DIAGNOSIS — I2510 Atherosclerotic heart disease of native coronary artery without angina pectoris: Secondary | ICD-10-CM | POA: Diagnosis not present

## 2017-07-02 ENCOUNTER — Ambulatory Visit
Admission: RE | Admit: 2017-07-02 | Discharge: 2017-07-02 | Disposition: A | Discharge status: Home / Self Care | Payer: Medicare Other | Source: Ambulatory Visit | Attending: Radiation Oncology | Admitting: Radiation Oncology

## 2017-07-02 ENCOUNTER — Ambulatory Visit: Payer: Medicare Other

## 2017-07-02 DIAGNOSIS — D45 Polycythemia vera: Secondary | ICD-10-CM | POA: Diagnosis not present

## 2017-07-02 DIAGNOSIS — D649 Anemia, unspecified: Secondary | ICD-10-CM | POA: Diagnosis not present

## 2017-07-02 DIAGNOSIS — I2510 Atherosclerotic heart disease of native coronary artery without angina pectoris: Secondary | ICD-10-CM | POA: Diagnosis not present

## 2017-07-02 DIAGNOSIS — Z510 Encounter for antineoplastic radiation therapy: Secondary | ICD-10-CM | POA: Diagnosis not present

## 2017-07-02 DIAGNOSIS — C679 Malignant neoplasm of bladder, unspecified: Secondary | ICD-10-CM | POA: Diagnosis not present

## 2017-07-02 DIAGNOSIS — K639 Disease of intestine, unspecified: Secondary | ICD-10-CM | POA: Diagnosis not present

## 2017-07-03 ENCOUNTER — Inpatient Hospital Stay: Payer: Medicare Other

## 2017-07-03 ENCOUNTER — Telehealth: Payer: Self-pay | Admitting: Internal Medicine

## 2017-07-03 ENCOUNTER — Ambulatory Visit: Payer: Medicare Other

## 2017-07-03 ENCOUNTER — Ambulatory Visit
Admission: RE | Admit: 2017-07-03 | Discharge: 2017-07-03 | Disposition: A | Discharge status: Home / Self Care | Payer: Medicare Other | Source: Ambulatory Visit | Attending: Radiation Oncology | Admitting: Radiation Oncology

## 2017-07-03 DIAGNOSIS — D649 Anemia, unspecified: Secondary | ICD-10-CM | POA: Diagnosis not present

## 2017-07-03 DIAGNOSIS — C61 Malignant neoplasm of prostate: Secondary | ICD-10-CM

## 2017-07-03 DIAGNOSIS — Z510 Encounter for antineoplastic radiation therapy: Secondary | ICD-10-CM | POA: Diagnosis not present

## 2017-07-03 DIAGNOSIS — Z87891 Personal history of nicotine dependence: Secondary | ICD-10-CM | POA: Diagnosis not present

## 2017-07-03 DIAGNOSIS — K639 Disease of intestine, unspecified: Secondary | ICD-10-CM | POA: Diagnosis not present

## 2017-07-03 DIAGNOSIS — I2510 Atherosclerotic heart disease of native coronary artery without angina pectoris: Secondary | ICD-10-CM | POA: Diagnosis not present

## 2017-07-03 DIAGNOSIS — D45 Polycythemia vera: Secondary | ICD-10-CM | POA: Diagnosis not present

## 2017-07-03 DIAGNOSIS — Z86711 Personal history of pulmonary embolism: Secondary | ICD-10-CM | POA: Diagnosis not present

## 2017-07-03 DIAGNOSIS — Z7901 Long term (current) use of anticoagulants: Secondary | ICD-10-CM | POA: Diagnosis not present

## 2017-07-03 DIAGNOSIS — Z803 Family history of malignant neoplasm of breast: Secondary | ICD-10-CM | POA: Diagnosis not present

## 2017-07-03 DIAGNOSIS — C679 Malignant neoplasm of bladder, unspecified: Secondary | ICD-10-CM | POA: Diagnosis not present

## 2017-07-03 DIAGNOSIS — Z8041 Family history of malignant neoplasm of ovary: Secondary | ICD-10-CM | POA: Diagnosis not present

## 2017-07-03 LAB — CBC
HCT: 42.4 % (ref 35.0–47.0)
Hemoglobin: 14.3 g/dL (ref 12.0–16.0)
MCH: 32.8 pg (ref 26.0–34.0)
MCHC: 33.8 g/dL (ref 32.0–36.0)
MCV: 97.1 fL (ref 80.0–100.0)
PLATELETS: 139 10*3/uL — AB (ref 150–440)
RBC: 4.37 MIL/uL (ref 3.80–5.20)
RDW: 13.6 % (ref 11.5–14.5)
WBC: 8.6 10*3/uL (ref 3.6–11.0)

## 2017-07-03 NOTE — Telephone Encounter (Signed)
-----   Message from Lars Masson, LPN sent at 6/57/8469 10:07 AM EST ----- Regarding: FW: Elevated BP Patient stated that she would like to wait on f/u with you until radiation is over. She stated that is a lot of running for her to do right now since she is having to go to the hospital everyday.  ----- Message ----- From: Einar Pheasant, MD Sent: 06/29/2017   1:11 PM To: Lars Masson, LPN Subject: FW: Elevated BP                                Please call and confirm pt doing ok.  Notify her that oncology notified us that blood pressure was elevated.  Needs a f/u appt with me.    Dr Nicki Reaper ----- Message ----- From: Livia Snellen, RN Sent: 06/29/2017  12:45 PM To: Einar Pheasant, MD Subject: Elevated BP                                    Ms. Wermuth was seen in our office yesterday and was found to be hypertensive. She was asymptomatic, but Dr. Janese Banks wanted you to be aware of this.  Thank you, Phylliss Bob, RN

## 2017-07-04 ENCOUNTER — Telehealth: Payer: Self-pay | Admitting: Internal Medicine

## 2017-07-04 ENCOUNTER — Ambulatory Visit: Payer: Medicare Other

## 2017-07-04 ENCOUNTER — Ambulatory Visit
Admission: RE | Admit: 2017-07-04 | Discharge: 2017-07-04 | Disposition: A | Discharge status: Home / Self Care | Payer: Medicare Other | Source: Ambulatory Visit | Attending: Radiation Oncology | Admitting: Radiation Oncology

## 2017-07-04 DIAGNOSIS — D45 Polycythemia vera: Secondary | ICD-10-CM | POA: Diagnosis not present

## 2017-07-04 DIAGNOSIS — Z510 Encounter for antineoplastic radiation therapy: Secondary | ICD-10-CM | POA: Diagnosis not present

## 2017-07-04 DIAGNOSIS — I2510 Atherosclerotic heart disease of native coronary artery without angina pectoris: Secondary | ICD-10-CM | POA: Diagnosis not present

## 2017-07-04 DIAGNOSIS — D649 Anemia, unspecified: Secondary | ICD-10-CM | POA: Diagnosis not present

## 2017-07-04 DIAGNOSIS — K639 Disease of intestine, unspecified: Secondary | ICD-10-CM | POA: Diagnosis not present

## 2017-07-04 DIAGNOSIS — C679 Malignant neoplasm of bladder, unspecified: Secondary | ICD-10-CM | POA: Diagnosis not present

## 2017-07-04 NOTE — Telephone Encounter (Signed)
Copied from [place] [id number]. Topic: Quick Communication - Rx Refill/Question >> Jul 04, 2017  3:34 PM Robina Ade, Helene Kelp D wrote: Medication: clopidogrel (PLAVIX) 75 MG tablet   Has the patient contacted their pharmacy? Yes   (Agent: If no, request that the patient contact the pharmacy for the refill.)   Preferred Pharmacy (with phone number or street name):   [place], [address]      Agent: Please be advised that RX refills may take up to 3 business days. We ask that you follow-up with your pharmacy.

## 2017-07-05 ENCOUNTER — Ambulatory Visit: Payer: Medicare Other

## 2017-07-05 ENCOUNTER — Ambulatory Visit
Admission: RE | Admit: 2017-07-05 | Discharge: 2017-07-05 | Disposition: A | Discharge status: Home / Self Care | Payer: Medicare Other | Source: Ambulatory Visit | Attending: Radiation Oncology | Admitting: Radiation Oncology

## 2017-07-05 DIAGNOSIS — D45 Polycythemia vera: Secondary | ICD-10-CM | POA: Diagnosis not present

## 2017-07-05 DIAGNOSIS — D649 Anemia, unspecified: Secondary | ICD-10-CM | POA: Diagnosis not present

## 2017-07-05 DIAGNOSIS — C679 Malignant neoplasm of bladder, unspecified: Secondary | ICD-10-CM | POA: Diagnosis not present

## 2017-07-05 DIAGNOSIS — I2510 Atherosclerotic heart disease of native coronary artery without angina pectoris: Secondary | ICD-10-CM | POA: Diagnosis not present

## 2017-07-05 DIAGNOSIS — K639 Disease of intestine, unspecified: Secondary | ICD-10-CM | POA: Diagnosis not present

## 2017-07-05 DIAGNOSIS — Z510 Encounter for antineoplastic radiation therapy: Secondary | ICD-10-CM | POA: Diagnosis not present

## 2017-07-05 NOTE — Telephone Encounter (Signed)
Patient stated that she was put on the eliquis in the hospital to help with her blood clot.. Patient stated she was trying to get you to refill instead of cardiology. It was a little confusing as to why she wanted you to fill but patient has been taking regularly and is almost out.

## 2017-07-05 NOTE — Telephone Encounter (Signed)
Left message for patient to call back  

## 2017-07-05 NOTE — Telephone Encounter (Signed)
Called and spoke to pt.  Dr Janese Banks had initially told her to stop plavix when starting eliquis - until could talk to cardiology - Dr Saralyn Pilar.  After discussion with Dr Saralyn Pilar, was determined that she needed to be on plavix for her heart and eliquis for the blood clot.  Pt aware and will continue.  Also, pt aware that Dr Saralyn Pilar has sent in refills.

## 2017-07-05 NOTE — Telephone Encounter (Signed)
OK to send in refill or should she contact Dr. Posey Pronto?

## 2017-07-05 NOTE — Telephone Encounter (Signed)
Ok

## 2017-07-05 NOTE — Telephone Encounter (Signed)
Clopidogrel   refill Last OV:   02/08/17 Last Refill: 02/15/17 Pharmacy:  [facility]  Was prescribed by  Fritzi Mandes, MD  Please advise.

## 2017-07-05 NOTE — Telephone Encounter (Signed)
Please call and confirm pt has been taking regularly and confirm reason on blood thinner.

## 2017-07-05 NOTE — Telephone Encounter (Signed)
Please advise 

## 2017-07-06 ENCOUNTER — Ambulatory Visit
Admission: RE | Admit: 2017-07-06 | Discharge: 2017-07-06 | Disposition: A | Discharge status: Home / Self Care | Payer: Medicare Other | Source: Ambulatory Visit | Attending: Radiation Oncology | Admitting: Radiation Oncology

## 2017-07-06 ENCOUNTER — Encounter (INDEPENDENT_AMBULATORY_CARE_PROVIDER_SITE_OTHER): Payer: Medicare Other

## 2017-07-06 ENCOUNTER — Ambulatory Visit: Payer: Medicare Other

## 2017-07-06 ENCOUNTER — Ambulatory Visit (INDEPENDENT_AMBULATORY_CARE_PROVIDER_SITE_OTHER): Payer: Medicare Other | Admitting: Vascular Surgery

## 2017-07-06 DIAGNOSIS — Z510 Encounter for antineoplastic radiation therapy: Secondary | ICD-10-CM | POA: Diagnosis not present

## 2017-07-06 DIAGNOSIS — D649 Anemia, unspecified: Secondary | ICD-10-CM | POA: Diagnosis not present

## 2017-07-06 DIAGNOSIS — D45 Polycythemia vera: Secondary | ICD-10-CM | POA: Diagnosis not present

## 2017-07-06 DIAGNOSIS — K639 Disease of intestine, unspecified: Secondary | ICD-10-CM | POA: Diagnosis not present

## 2017-07-06 DIAGNOSIS — C679 Malignant neoplasm of bladder, unspecified: Secondary | ICD-10-CM | POA: Diagnosis not present

## 2017-07-06 DIAGNOSIS — I2510 Atherosclerotic heart disease of native coronary artery without angina pectoris: Secondary | ICD-10-CM | POA: Diagnosis not present

## 2017-07-06 NOTE — Telephone Encounter (Signed)
Pt called to advise Dr Saralyn Pilar called her today and told her to stop the plavix. Pt wanted Dr Nicki Reaper to know.  Pt states she is only supposed to take apixaban (ELIQUIS) 5 MG TABS tablet

## 2017-07-09 ENCOUNTER — Ambulatory Visit: Payer: Medicare Other

## 2017-07-09 ENCOUNTER — Ambulatory Visit
Admission: RE | Admit: 2017-07-09 | Discharge: 2017-07-09 | Disposition: A | Discharge status: Home / Self Care | Payer: Medicare Other | Source: Ambulatory Visit | Attending: Radiation Oncology | Admitting: Radiation Oncology

## 2017-07-09 DIAGNOSIS — D45 Polycythemia vera: Secondary | ICD-10-CM | POA: Diagnosis not present

## 2017-07-09 DIAGNOSIS — Z510 Encounter for antineoplastic radiation therapy: Secondary | ICD-10-CM | POA: Diagnosis not present

## 2017-07-09 DIAGNOSIS — K639 Disease of intestine, unspecified: Secondary | ICD-10-CM | POA: Diagnosis not present

## 2017-07-09 DIAGNOSIS — I2510 Atherosclerotic heart disease of native coronary artery without angina pectoris: Secondary | ICD-10-CM | POA: Diagnosis not present

## 2017-07-09 DIAGNOSIS — C679 Malignant neoplasm of bladder, unspecified: Secondary | ICD-10-CM | POA: Diagnosis not present

## 2017-07-09 DIAGNOSIS — D649 Anemia, unspecified: Secondary | ICD-10-CM | POA: Diagnosis not present

## 2017-07-10 ENCOUNTER — Inpatient Hospital Stay: Payer: Medicare Other

## 2017-07-10 ENCOUNTER — Ambulatory Visit
Admission: RE | Admit: 2017-07-10 | Discharge: 2017-07-10 | Disposition: A | Discharge status: Home / Self Care | Payer: Medicare Other | Source: Ambulatory Visit | Attending: Radiation Oncology | Admitting: Radiation Oncology

## 2017-07-10 ENCOUNTER — Ambulatory Visit: Payer: Medicare Other

## 2017-07-10 DIAGNOSIS — C61 Malignant neoplasm of prostate: Secondary | ICD-10-CM

## 2017-07-10 DIAGNOSIS — Z7901 Long term (current) use of anticoagulants: Secondary | ICD-10-CM | POA: Diagnosis not present

## 2017-07-10 DIAGNOSIS — C679 Malignant neoplasm of bladder, unspecified: Secondary | ICD-10-CM

## 2017-07-10 DIAGNOSIS — Z8041 Family history of malignant neoplasm of ovary: Secondary | ICD-10-CM | POA: Diagnosis not present

## 2017-07-10 DIAGNOSIS — Z86711 Personal history of pulmonary embolism: Secondary | ICD-10-CM | POA: Diagnosis not present

## 2017-07-10 DIAGNOSIS — I2510 Atherosclerotic heart disease of native coronary artery without angina pectoris: Secondary | ICD-10-CM | POA: Diagnosis not present

## 2017-07-10 DIAGNOSIS — D649 Anemia, unspecified: Secondary | ICD-10-CM | POA: Diagnosis not present

## 2017-07-10 DIAGNOSIS — Z803 Family history of malignant neoplasm of breast: Secondary | ICD-10-CM | POA: Diagnosis not present

## 2017-07-10 DIAGNOSIS — Z510 Encounter for antineoplastic radiation therapy: Secondary | ICD-10-CM | POA: Diagnosis not present

## 2017-07-10 DIAGNOSIS — Z87891 Personal history of nicotine dependence: Secondary | ICD-10-CM | POA: Diagnosis not present

## 2017-07-10 DIAGNOSIS — K639 Disease of intestine, unspecified: Secondary | ICD-10-CM | POA: Diagnosis not present

## 2017-07-10 DIAGNOSIS — D45 Polycythemia vera: Secondary | ICD-10-CM | POA: Diagnosis not present

## 2017-07-10 LAB — CBC
HCT: 44.7 % (ref 35.0–47.0)
HEMOGLOBIN: 14.9 g/dL (ref 12.0–16.0)
MCH: 32.6 pg (ref 26.0–34.0)
MCHC: 33.4 g/dL (ref 32.0–36.0)
MCV: 97.6 fL (ref 80.0–100.0)
Platelets: 154 10*3/uL (ref 150–440)
RBC: 4.58 MIL/uL (ref 3.80–5.20)
RDW: 12.9 % (ref 11.5–14.5)
WBC: 6.9 10*3/uL (ref 3.6–11.0)

## 2017-07-11 ENCOUNTER — Ambulatory Visit: Payer: Medicare Other

## 2017-07-11 ENCOUNTER — Ambulatory Visit
Admission: RE | Admit: 2017-07-11 | Discharge: 2017-07-11 | Disposition: A | Discharge status: Home / Self Care | Payer: Medicare Other | Source: Ambulatory Visit | Attending: Radiation Oncology | Admitting: Radiation Oncology

## 2017-07-11 ENCOUNTER — Other Ambulatory Visit: Payer: Medicare Other

## 2017-07-11 DIAGNOSIS — K639 Disease of intestine, unspecified: Secondary | ICD-10-CM | POA: Diagnosis not present

## 2017-07-11 DIAGNOSIS — D649 Anemia, unspecified: Secondary | ICD-10-CM | POA: Diagnosis not present

## 2017-07-11 DIAGNOSIS — D45 Polycythemia vera: Secondary | ICD-10-CM | POA: Diagnosis not present

## 2017-07-11 DIAGNOSIS — C679 Malignant neoplasm of bladder, unspecified: Secondary | ICD-10-CM | POA: Diagnosis not present

## 2017-07-11 DIAGNOSIS — Z510 Encounter for antineoplastic radiation therapy: Secondary | ICD-10-CM | POA: Diagnosis not present

## 2017-07-11 DIAGNOSIS — I2510 Atherosclerotic heart disease of native coronary artery without angina pectoris: Secondary | ICD-10-CM | POA: Diagnosis not present

## 2017-07-11 NOTE — Telephone Encounter (Signed)
Pt completed Plavix 07/06/17; [facility] made aware. RX cancelled

## 2017-07-11 NOTE — Telephone Encounter (Signed)
Copied from [place] [id number]. Topic: Quick Communication - Rx Refill/Question >> Jul 11, 2017 10:23 AM Carolyn Stare wrote: Medication   clopidogrel (PLAVIX) 75 MG tablet   Has the patient contacted their pharmacy yes    Preferred Pharmacy  [facility]    Agent: Please be advised that RX refills may take up to 3 business days. We ask that you follow-up with your pharmacy.

## 2017-07-12 ENCOUNTER — Ambulatory Visit
Admission: RE | Admit: 2017-07-12 | Discharge: 2017-07-12 | Disposition: A | Discharge status: Home / Self Care | Payer: Medicare Other | Source: Ambulatory Visit | Attending: Radiation Oncology | Admitting: Radiation Oncology

## 2017-07-12 ENCOUNTER — Ambulatory Visit: Payer: Medicare Other

## 2017-07-12 DIAGNOSIS — Z510 Encounter for antineoplastic radiation therapy: Secondary | ICD-10-CM | POA: Diagnosis not present

## 2017-07-12 DIAGNOSIS — I2510 Atherosclerotic heart disease of native coronary artery without angina pectoris: Secondary | ICD-10-CM | POA: Diagnosis not present

## 2017-07-12 DIAGNOSIS — K639 Disease of intestine, unspecified: Secondary | ICD-10-CM | POA: Diagnosis not present

## 2017-07-12 DIAGNOSIS — D45 Polycythemia vera: Secondary | ICD-10-CM | POA: Diagnosis not present

## 2017-07-12 DIAGNOSIS — C679 Malignant neoplasm of bladder, unspecified: Secondary | ICD-10-CM | POA: Diagnosis not present

## 2017-07-12 DIAGNOSIS — D649 Anemia, unspecified: Secondary | ICD-10-CM | POA: Diagnosis not present

## 2017-07-13 ENCOUNTER — Ambulatory Visit
Admission: RE | Admit: 2017-07-13 | Discharge: 2017-07-13 | Disposition: A | Discharge status: Home / Self Care | Payer: Medicare Other | Source: Ambulatory Visit | Attending: Radiation Oncology | Admitting: Radiation Oncology

## 2017-07-13 ENCOUNTER — Ambulatory Visit: Payer: Medicare Other

## 2017-07-13 DIAGNOSIS — Z510 Encounter for antineoplastic radiation therapy: Secondary | ICD-10-CM | POA: Insufficient documentation

## 2017-07-13 DIAGNOSIS — C679 Malignant neoplasm of bladder, unspecified: Secondary | ICD-10-CM | POA: Diagnosis not present

## 2017-07-13 NOTE — Telephone Encounter (Deleted)
Copied from [place] [id number]. Topic: General - Call Back - No Documentation >> Jul 13, 2017  2:34 PM Vernona Rieger wrote: Reason for CRM:   Patient said she missed a call from [place]. Please call back at [phone]

## 2017-07-16 ENCOUNTER — Ambulatory Visit: Payer: Medicare Other

## 2017-07-16 ENCOUNTER — Ambulatory Visit
Admission: RE | Admit: 2017-07-16 | Discharge: 2017-07-16 | Disposition: A | Discharge status: Home / Self Care | Payer: Medicare Other | Source: Ambulatory Visit | Attending: Radiation Oncology | Admitting: Radiation Oncology

## 2017-07-16 DIAGNOSIS — C679 Malignant neoplasm of bladder, unspecified: Secondary | ICD-10-CM | POA: Diagnosis not present

## 2017-07-16 DIAGNOSIS — Z510 Encounter for antineoplastic radiation therapy: Secondary | ICD-10-CM | POA: Diagnosis not present

## 2017-07-17 ENCOUNTER — Ambulatory Visit: Payer: Medicare Other

## 2017-07-17 ENCOUNTER — Ambulatory Visit
Admission: RE | Admit: 2017-07-17 | Discharge: 2017-07-17 | Disposition: A | Discharge status: Home / Self Care | Payer: Medicare Other | Source: Ambulatory Visit | Attending: Radiation Oncology | Admitting: Radiation Oncology

## 2017-07-17 ENCOUNTER — Inpatient Hospital Stay: Payer: Medicare Other | Attending: Radiation Oncology

## 2017-07-17 DIAGNOSIS — C679 Malignant neoplasm of bladder, unspecified: Secondary | ICD-10-CM | POA: Diagnosis not present

## 2017-07-17 DIAGNOSIS — Z510 Encounter for antineoplastic radiation therapy: Secondary | ICD-10-CM | POA: Diagnosis not present

## 2017-07-17 LAB — COMPREHENSIVE METABOLIC PANEL
ALT: 12 U/L — AB (ref 14–54)
AST: 18 U/L (ref 15–41)
Albumin: 4.1 g/dL (ref 3.5–5.0)
Alkaline Phosphatase: 69 U/L (ref 38–126)
Anion gap: 8 (ref 5–15)
BUN: 20 mg/dL (ref 6–20)
CALCIUM: 9.3 mg/dL (ref 8.9–10.3)
CHLORIDE: 106 mmol/L (ref 101–111)
CO2: 28 mmol/L (ref 22–32)
CREATININE: 0.93 mg/dL (ref 0.44–1.00)
GFR, EST NON AFRICAN AMERICAN: 57 mL/min — AB (ref >60)
Glucose, Bld: 118 mg/dL — ABNORMAL HIGH (ref 65–99)
Potassium: 3.8 mmol/L (ref 3.5–5.1)
Sodium: 142 mmol/L (ref 135–145)
TOTAL PROTEIN: 6.9 g/dL (ref 6.5–8.1)
Total Bilirubin: 0.5 mg/dL (ref 0.3–1.2)

## 2017-07-17 LAB — CBC WITH DIFFERENTIAL/PLATELET
BASOS ABS: 0.1 10*3/uL (ref 0–0.1)
BASOS PCT: 1 %
EOS ABS: 0.4 10*3/uL (ref 0–0.7)
Eosinophils Relative: 5 %
HCT: 44.3 % (ref 35.0–47.0)
Hemoglobin: 15.3 g/dL (ref 12.0–16.0)
LYMPHS ABS: 1.2 10*3/uL (ref 1.0–3.6)
Lymphocytes Relative: 17 %
MCH: 33 pg (ref 26.0–34.0)
MCHC: 34.4 g/dL (ref 32.0–36.0)
MCV: 96 fL (ref 80.0–100.0)
Monocytes Absolute: 0.5 10*3/uL (ref 0.2–0.9)
Monocytes Relative: 7 %
NEUTROS PCT: 70 %
Neutro Abs: 4.8 10*3/uL (ref 1.4–6.5)
Platelets: 160 10*3/uL (ref 150–440)
RBC: 4.62 MIL/uL (ref 3.80–5.20)
RDW: 13 % (ref 11.5–14.5)
WBC: 7 10*3/uL (ref 3.6–11.0)

## 2017-07-18 ENCOUNTER — Ambulatory Visit
Admission: RE | Admit: 2017-07-18 | Discharge: 2017-07-18 | Disposition: A | Discharge status: Home / Self Care | Payer: Medicare Other | Source: Ambulatory Visit | Attending: Radiation Oncology | Admitting: Radiation Oncology

## 2017-07-18 ENCOUNTER — Ambulatory Visit: Payer: Medicare Other

## 2017-07-18 DIAGNOSIS — Z510 Encounter for antineoplastic radiation therapy: Secondary | ICD-10-CM | POA: Diagnosis not present

## 2017-07-18 DIAGNOSIS — C679 Malignant neoplasm of bladder, unspecified: Secondary | ICD-10-CM | POA: Diagnosis not present

## 2017-07-19 ENCOUNTER — Ambulatory Visit
Admission: RE | Admit: 2017-07-19 | Discharge: 2017-07-19 | Disposition: A | Discharge status: Home / Self Care | Payer: Medicare Other | Source: Ambulatory Visit | Attending: Radiation Oncology | Admitting: Radiation Oncology

## 2017-07-19 ENCOUNTER — Ambulatory Visit: Payer: Medicare Other

## 2017-07-19 DIAGNOSIS — C679 Malignant neoplasm of bladder, unspecified: Secondary | ICD-10-CM | POA: Diagnosis not present

## 2017-07-19 DIAGNOSIS — Z510 Encounter for antineoplastic radiation therapy: Secondary | ICD-10-CM | POA: Diagnosis not present

## 2017-07-20 ENCOUNTER — Ambulatory Visit: Payer: Medicare Other

## 2017-07-20 ENCOUNTER — Ambulatory Visit
Admission: RE | Admit: 2017-07-20 | Discharge: 2017-07-20 | Disposition: A | Discharge status: Home / Self Care | Payer: Medicare Other | Source: Ambulatory Visit | Attending: Radiation Oncology | Admitting: Radiation Oncology

## 2017-07-20 DIAGNOSIS — C679 Malignant neoplasm of bladder, unspecified: Secondary | ICD-10-CM | POA: Diagnosis not present

## 2017-07-20 DIAGNOSIS — Z510 Encounter for antineoplastic radiation therapy: Secondary | ICD-10-CM | POA: Diagnosis not present

## 2017-07-23 ENCOUNTER — Ambulatory Visit
Admission: RE | Admit: 2017-07-23 | Discharge: 2017-07-23 | Disposition: A | Discharge status: Home / Self Care | Payer: Medicare Other | Source: Ambulatory Visit | Attending: Radiation Oncology | Admitting: Radiation Oncology

## 2017-07-23 ENCOUNTER — Ambulatory Visit: Payer: Medicare Other

## 2017-07-23 DIAGNOSIS — Z510 Encounter for antineoplastic radiation therapy: Secondary | ICD-10-CM | POA: Diagnosis not present

## 2017-07-23 DIAGNOSIS — C679 Malignant neoplasm of bladder, unspecified: Secondary | ICD-10-CM | POA: Diagnosis not present

## 2017-07-24 ENCOUNTER — Ambulatory Visit: Payer: Medicare Other

## 2017-07-24 ENCOUNTER — Ambulatory Visit
Admission: RE | Admit: 2017-07-24 | Discharge: 2017-07-24 | Disposition: A | Discharge status: Home / Self Care | Payer: Medicare Other | Source: Ambulatory Visit | Attending: Radiation Oncology | Admitting: Radiation Oncology

## 2017-07-24 ENCOUNTER — Inpatient Hospital Stay: Payer: Medicare Other

## 2017-07-24 DIAGNOSIS — C679 Malignant neoplasm of bladder, unspecified: Secondary | ICD-10-CM

## 2017-07-24 DIAGNOSIS — Z510 Encounter for antineoplastic radiation therapy: Secondary | ICD-10-CM | POA: Diagnosis not present

## 2017-07-24 DIAGNOSIS — C61 Malignant neoplasm of prostate: Secondary | ICD-10-CM

## 2017-07-24 LAB — CBC
HEMATOCRIT: 45.4 % (ref 35.0–47.0)
Hemoglobin: 15.6 g/dL (ref 12.0–16.0)
MCH: 32.5 pg (ref 26.0–34.0)
MCHC: 34.3 g/dL (ref 32.0–36.0)
MCV: 94.6 fL (ref 80.0–100.0)
Platelets: 154 10*3/uL (ref 150–440)
RBC: 4.8 MIL/uL (ref 3.80–5.20)
RDW: 13.1 % (ref 11.5–14.5)
WBC: 7.3 10*3/uL (ref 3.6–11.0)

## 2017-07-25 ENCOUNTER — Ambulatory Visit
Admission: RE | Admit: 2017-07-25 | Discharge: 2017-07-25 | Disposition: A | Discharge status: Home / Self Care | Payer: Medicare Other | Source: Ambulatory Visit | Attending: Radiation Oncology | Admitting: Radiation Oncology

## 2017-07-25 DIAGNOSIS — Z510 Encounter for antineoplastic radiation therapy: Secondary | ICD-10-CM | POA: Diagnosis not present

## 2017-07-25 DIAGNOSIS — C679 Malignant neoplasm of bladder, unspecified: Secondary | ICD-10-CM | POA: Diagnosis not present

## 2017-07-26 ENCOUNTER — Ambulatory Visit
Admission: RE | Admit: 2017-07-26 | Discharge: 2017-07-26 | Disposition: A | Discharge status: Home / Self Care | Payer: Medicare Other | Source: Ambulatory Visit | Attending: Radiation Oncology | Admitting: Radiation Oncology

## 2017-07-26 DIAGNOSIS — C679 Malignant neoplasm of bladder, unspecified: Secondary | ICD-10-CM | POA: Diagnosis not present

## 2017-07-26 DIAGNOSIS — Z510 Encounter for antineoplastic radiation therapy: Secondary | ICD-10-CM | POA: Diagnosis not present

## 2017-07-27 ENCOUNTER — Ambulatory Visit: Payer: Medicare Other

## 2017-07-27 ENCOUNTER — Ambulatory Visit
Admission: RE | Admit: 2017-07-27 | Discharge: 2017-07-27 | Disposition: A | Discharge status: Home / Self Care | Payer: Medicare Other | Source: Ambulatory Visit | Attending: Radiation Oncology | Admitting: Radiation Oncology

## 2017-07-27 DIAGNOSIS — Z510 Encounter for antineoplastic radiation therapy: Secondary | ICD-10-CM | POA: Diagnosis not present

## 2017-07-27 DIAGNOSIS — C679 Malignant neoplasm of bladder, unspecified: Secondary | ICD-10-CM | POA: Diagnosis not present

## 2017-07-30 ENCOUNTER — Ambulatory Visit
Admission: RE | Admit: 2017-07-30 | Discharge: 2017-07-30 | Disposition: A | Discharge status: Home / Self Care | Payer: Medicare Other | Source: Ambulatory Visit | Attending: Radiation Oncology | Admitting: Radiation Oncology

## 2017-07-30 DIAGNOSIS — Z510 Encounter for antineoplastic radiation therapy: Secondary | ICD-10-CM | POA: Diagnosis not present

## 2017-07-30 DIAGNOSIS — C679 Malignant neoplasm of bladder, unspecified: Secondary | ICD-10-CM | POA: Diagnosis not present

## 2017-07-31 ENCOUNTER — Other Ambulatory Visit: Payer: Self-pay | Admitting: Oncology

## 2017-08-30 ENCOUNTER — Encounter: Payer: Self-pay | Admitting: Oncology

## 2017-09-03 ENCOUNTER — Telehealth: Payer: Self-pay | Admitting: Internal Medicine

## 2017-09-03 ENCOUNTER — Emergency Department
Admission: EM | Admit: 2017-09-03 | Discharge: 2017-09-03 | Disposition: A | Discharge status: Home / Self Care | Payer: Medicare Other | Attending: Student in an Organized Health Care Education/Training Program | Admitting: Student in an Organized Health Care Education/Training Program

## 2017-09-03 ENCOUNTER — Other Ambulatory Visit: Payer: Self-pay

## 2017-09-03 ENCOUNTER — Other Ambulatory Visit: Payer: Self-pay | Admitting: *Deleted

## 2017-09-03 ENCOUNTER — Inpatient Hospital Stay (HOSPITAL_BASED_OUTPATIENT_CLINIC_OR_DEPARTMENT_OTHER): Payer: Medicare Other | Admitting: Nurse Practitioner

## 2017-09-03 ENCOUNTER — Telehealth: Payer: Self-pay

## 2017-09-03 ENCOUNTER — Encounter: Payer: Self-pay | Admitting: Emergency Medicine

## 2017-09-03 ENCOUNTER — Ambulatory Visit
Admission: RE | Admit: 2017-09-03 | Discharge: 2017-09-03 | Disposition: A | Discharge status: Home / Self Care | Payer: Medicare Other | Source: Ambulatory Visit | Attending: Radiation Oncology | Admitting: Radiation Oncology

## 2017-09-03 ENCOUNTER — Inpatient Hospital Stay: Payer: Medicare Other | Attending: Nurse Practitioner

## 2017-09-03 VITALS — BP 206/81 | HR 72 | Temp 98.2°F | Resp 20

## 2017-09-03 VITALS — BP 172/60 | HR 74 | Temp 98.2°F | Resp 18 | Wt 136.1 lb

## 2017-09-03 DIAGNOSIS — R3915 Urgency of urination: Secondary | ICD-10-CM | POA: Insufficient documentation

## 2017-09-03 DIAGNOSIS — Z85828 Personal history of other malignant neoplasm of skin: Secondary | ICD-10-CM | POA: Insufficient documentation

## 2017-09-03 DIAGNOSIS — N183 Chronic kidney disease, stage 3 unspecified: Secondary | ICD-10-CM

## 2017-09-03 DIAGNOSIS — Z79899 Other long term (current) drug therapy: Secondary | ICD-10-CM | POA: Insufficient documentation

## 2017-09-03 DIAGNOSIS — I959 Hypotension, unspecified: Secondary | ICD-10-CM

## 2017-09-03 DIAGNOSIS — Z9221 Personal history of antineoplastic chemotherapy: Secondary | ICD-10-CM

## 2017-09-03 DIAGNOSIS — C679 Malignant neoplasm of bladder, unspecified: Secondary | ICD-10-CM

## 2017-09-03 DIAGNOSIS — I2510 Atherosclerotic heart disease of native coronary artery without angina pectoris: Secondary | ICD-10-CM | POA: Diagnosis not present

## 2017-09-03 DIAGNOSIS — R42 Dizziness and giddiness: Secondary | ICD-10-CM

## 2017-09-03 DIAGNOSIS — J449 Chronic obstructive pulmonary disease, unspecified: Secondary | ICD-10-CM | POA: Insufficient documentation

## 2017-09-03 DIAGNOSIS — Z86711 Personal history of pulmonary embolism: Secondary | ICD-10-CM | POA: Diagnosis not present

## 2017-09-03 DIAGNOSIS — Z923 Personal history of irradiation: Secondary | ICD-10-CM | POA: Insufficient documentation

## 2017-09-03 DIAGNOSIS — Z7901 Long term (current) use of anticoagulants: Secondary | ICD-10-CM | POA: Diagnosis not present

## 2017-09-03 DIAGNOSIS — I10 Essential (primary) hypertension: Secondary | ICD-10-CM | POA: Diagnosis not present

## 2017-09-03 DIAGNOSIS — Z87891 Personal history of nicotine dependence: Secondary | ICD-10-CM | POA: Insufficient documentation

## 2017-09-03 DIAGNOSIS — H8110 Benign paroxysmal vertigo, unspecified ear: Secondary | ICD-10-CM

## 2017-09-03 DIAGNOSIS — D751 Secondary polycythemia: Secondary | ICD-10-CM | POA: Diagnosis not present

## 2017-09-03 DIAGNOSIS — Z955 Presence of coronary angioplasty implant and graft: Secondary | ICD-10-CM | POA: Diagnosis not present

## 2017-09-03 DIAGNOSIS — I129 Hypertensive chronic kidney disease with stage 1 through stage 4 chronic kidney disease, or unspecified chronic kidney disease: Secondary | ICD-10-CM | POA: Diagnosis not present

## 2017-09-03 LAB — CBC WITH DIFFERENTIAL/PLATELET
BASOS PCT: 0 %
Basophils Absolute: 0 10*3/uL (ref 0–0.1)
EOS ABS: 0.2 10*3/uL (ref 0–0.7)
Eosinophils Relative: 2 %
HCT: 43 % (ref 35.0–47.0)
Hemoglobin: 15 g/dL (ref 12.0–16.0)
Lymphocytes Relative: 12 %
Lymphs Abs: 1 10*3/uL (ref 1.0–3.6)
MCH: 32 pg (ref 26.0–34.0)
MCHC: 35 g/dL (ref 32.0–36.0)
MCV: 91.5 fL (ref 80.0–100.0)
MONO ABS: 0.5 10*3/uL (ref 0.2–0.9)
MONOS PCT: 6 %
NEUTROS PCT: 80 %
Neutro Abs: 6.5 10*3/uL (ref 1.4–6.5)
Platelets: 151 10*3/uL (ref 150–440)
RBC: 4.7 MIL/uL (ref 3.80–5.20)
RDW: 12.9 % (ref 11.5–14.5)
WBC: 8.2 10*3/uL (ref 3.6–11.0)

## 2017-09-03 LAB — COMPREHENSIVE METABOLIC PANEL
ALK PHOS: 60 U/L (ref 38–126)
ALT: 15 U/L (ref 14–54)
AST: 20 U/L (ref 15–41)
Albumin: 3.9 g/dL (ref 3.5–5.0)
Anion gap: 10 (ref 5–15)
BILIRUBIN TOTAL: 1 mg/dL (ref 0.3–1.2)
BUN: 24 mg/dL — ABNORMAL HIGH (ref 6–20)
CALCIUM: 9.5 mg/dL (ref 8.9–10.3)
CO2: 25 mmol/L (ref 22–32)
CREATININE: 0.94 mg/dL (ref 0.44–1.00)
Chloride: 106 mmol/L (ref 101–111)
GFR calc non Af Amer: 56 mL/min — ABNORMAL LOW (ref >60)
GLUCOSE: 131 mg/dL — AB (ref 65–99)
Potassium: 4.1 mmol/L (ref 3.5–5.1)
SODIUM: 141 mmol/L (ref 135–145)
TOTAL PROTEIN: 6.4 g/dL — AB (ref 6.5–8.1)

## 2017-09-03 MED ORDER — CLONIDINE HCL 0.1 MG PO TABS: 0.1000 mg | ORAL_TABLET | Freq: Once | ORAL | Status: DC

## 2017-09-03 MED ORDER — MIRABEGRON ER 25 MG PO TB24: 25.0000 mg | ORAL_TABLET | Freq: Every day | ORAL | 3 refills | Status: DC

## 2017-09-03 MED ORDER — METOPROLOL TARTRATE 12.5 MG HALF TABLET
12.5000 mg | ORAL_TABLET | Freq: Once | ORAL | Status: AC
  Administered 2017-09-03: 12.5 mg via ORAL
  Filled 2017-09-03: qty 1

## 2017-09-03 MED ORDER — AMLODIPINE BESYLATE 5 MG PO TABS
10.0000 mg | ORAL_TABLET | Freq: Once | ORAL | Status: AC
  Administered 2017-09-03: 10 mg via ORAL
  Filled 2017-09-03: qty 2

## 2017-09-03 MED ORDER — CLONIDINE HCL 0.1 MG PO TABS
0.1000 mg | ORAL_TABLET | Freq: Once | ORAL | Status: DC
  Filled 2017-09-03: qty 1

## 2017-09-03 NOTE — Telephone Encounter (Signed)
Pt in ER now.

## 2017-09-03 NOTE — Telephone Encounter (Signed)
Patient is currently at the ED for evaluation

## 2017-09-03 NOTE — Progress Notes (Signed)
Radiation Oncology Follow up Note  Name: Tina [name]   Date:   09/03/2017 MRN:  [id number] DOB: 1935-02-24    This 83 y.o. female presents to the clinic today for one-month follow-up status post pelvic radiation for small cell carcinoma involving the bladder.  REFERRING PROVIDER: Einar Pheasant, MD  HPI: patient is a 83 year old female now 1 month out having completed external beam radiation therapy to her bladder and a patient status post new adjuvant chemotherapy for small cell carcinoma the bladder. She is seen today is somewhat weak.. She is feeling somewhat lightheaded and a little unstable on her feet. She is seen the symptom management team in the oncology clinic today. She specifically denies hematuria. She is having someurinary frequency and urgency is on no medication for that..  COMPLICATIONS OF TREATMENT: none  FOLLOW UP COMPLIANCE: keeps appointments   PHYSICAL EXAM:  BP (!) 172/60   Pulse 74   Temp 98.2 F (36.8 C)   Resp 18   Wt 136 lb 2.1 oz (61.7 kg)   BMI 23.37 kg/m  Well-developed well-nourished patient in NAD. HEENT reveals PERLA, EOMI, discs not visualized.  Oral cavity is clear. No oral mucosal lesions are identified. Neck is clear without evidence of cervical or supraclavicular adenopathy. Lungs are clear to A&P. Cardiac examination is essentially unremarkable with regular rate and rhythm without murmur rub or thrill. Abdomen is benign with no organomegaly or masses noted. Motor sensory and DTR levels are equal and symmetric in the upper and lower extremities. Cranial nerves II through XII are grossly intact. Proprioception is intact. No peripheral adenopathy or edema is identified. No motor or sensory levels are noted. Crude visual fields are within normal range.  RADIOLOGY RESULTS: no current films for review  PLAN: present time patient is doing fairly well. I've asked is make a follow-up appointment with Dr. Erlene Quan for evaluation. I'm also starting on  mybetrickfor bladder symptoms. Patient also be seen the symptom management team today possibly for need of fluids. I have asked to see her back in 3 months for follow-up. Anticipate a CT scan of her abdomen pelvis prior to that visit.  I would like to take this opportunity to thank you for allowing me to participate in the care of your patient.Noreene Filbert, MD

## 2017-09-03 NOTE — Telephone Encounter (Signed)
Lauren Allen,NP at [facility] calling to report that pt has a BP of 206/81. Lauren states that the physician at the oncology center would give the pt Clonidine 0.1mg , if the pt could come in for an appt in the office today. Otherwise the pt would be advised to go the Emergency room.   Contacted FC, Trisha to see if the pt could be worked in for appt to be seen today, but Larena Glassman states that Dr. Nicki Reaper is in a meeting currently and at this time there is no appt availability for the pt to be worked in for appt. Larena Glassman advises pt to be seen at in the ED for BP of 206/81.   Lauren Allen,NP called back and she states that the pt walked out the clinic due to having to wait  to see if she would be able to come in for appt today. Lauren Allen,NP asking if triage nurse could call pt and schedule appt for first available time. NP did not state that pt was advised to go to the ED  Attempted to call pt to advise pt to go to the ED for treatment of BP of 206/81, but only number on file was the home number in which Lake Bells, the pt's son answered the phone. Pt's son Lake Bells notified that the pt needed to go to the ED for treatment of BP. Understanding verbalized and pt's son states he will tell the pt once she comes home.

## 2017-09-03 NOTE — Progress Notes (Signed)
Symptom Management Consult Note [facility]  Telephone:([phone] [phone]  Patient Care Team: Einar Pheasant, MD as PCP - General (Internal Medicine) Clent Jacks, RN as Registered Nurse   Name of the patient: Tina [name]  [id number]  1935/04/17   Date of visit: 09/03/17  Diagnosis-small cell carcinoma of the bladder  Chief complaint/ Reason for visit- dizziness  Heme/Onc history: Patient last evaluated by primary oncologist, Dr. Janese Banks, on 06/26/2017.  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  She has a history of lung nodules and was getting low-dose lung cancer screening scans on 07/04/2014 when her insurance stopped pain.  She presented to hospital in October 2018 with symptoms of nausea, diarrhea, weight loss, and fatigue.  She was noted to have a regular wall thickening in the ascending colon suspicious for colon cancer as well as 4.2 x  3.2 cm posterior bladder wall mass that was incidentally noted.  She was seen by vascular surgery and underwent abdominal angiogram with stent placement at the [facility].  She underwent colonoscopy for concerns of ischemic colitis which showed irregular thickening of the descending colon consistent with ischemic colitis.  EGD showed small hiatal hernia.  Gastric ulcers were noted and were treated with argon photocoagulation. She was seen by Dr. Erlene Quan and underwent TURBT with bilateral ureteral stent placement on 02/13/2017. At that time, tumor was noted to be extensive, greater than 5 cm with firm nodular mass involving the trigone and posterior bladder wall. Tumor was debulked but not completed resected.   Pathology showed small cell carcinoma with muscularis propria invasion.    Staging PET scan and brain MRI on 02/27/17 showed no obvious metastatic disease.   She returned to the OR on 03/26/17 for stent  exchange. At that time, no obvious residual tumor. Biopsy of single area of central necrosis performed showed no viable tumor.   She completed 4 cycles of carbo + etoposide on 05/09/2017.  Patient seen by urology and radiation oncology to discuss surgery versus definitive radiation following chemotherapy and decided to pursue radiation treatment.  She started radiation 06/2017 and received a total of 28 fractions, completing treatment on 07/30/17.   Interval history-  Tina [name] presents to [facility] with complaints of dizziness. She was seen by Dr. Baruch Gouty earlier today for 1 month post-radiation follow-up. She states that symptoms began shortly after starting radiation treatments and they have not resolved. She states symptoms have been present for approximately 2 months. She describes symptoms as 'feeling lightheaded'. She feels symptoms are exacerbated by changing position from laying to sitting and sitting to standing. She also has symptoms when she has been bending over and raises up. She denies aural pressure, otalgia, otorrhea, tinnitus, and/or hearing loss. She has not had previous work-up for symptoms.  She states that she has noticed progressively increasing weakness and deconditioning the past several months specifically was able to mow her grass.  States symptoms are mild and do not severely interfere with her ADLs.  Specifically citing that she walked up stairs of cancer center without assistance.  Also cites concern with blood pressure.  Has not seen PCP in several months.  Has taken her blood pressure medications as prescribed this morning.   ECOG FS:1 - Symptomatic but completely ambulatory  Review of systems- Review of Systems  Constitutional: Negative.   HENT: Negative for congestion, ear discharge, ear pain, hearing loss,  nosebleeds, sinus pain, sore throat and tinnitus.   Eyes: Negative.  Negative for pain.  Respiratory: Positive for shortness of breath (with Exertion; chronic).  Negative for cough, hemoptysis, sputum production, wheezing and stridor.   Cardiovascular: Negative for chest pain, palpitations, orthopnea, claudication, leg swelling and PND.  Gastrointestinal: Negative.   Genitourinary: Negative.   Musculoskeletal: Negative.   Skin: Negative.   Neurological: Positive for dizziness. Negative for tingling, tremors, sensory change, speech change, focal weakness, seizures, loss of consciousness, weakness and headaches.  Endo/Heme/Allergies: Negative.   Psychiatric/Behavioral: Negative.      Current treatment- s/p pelvic radiation, s/p 4 cycles carbo+etoposide   Allergies  Allergen Reactions  . Evista [Raloxifene] Other (See Comments)    Night sweats   . Fluticasone-Salmeterol Other (See Comments)    Cough, "chokes me"     Past Medical History:  Diagnosis Date  . Anemia   . Asthma   . Bladder cancer ([place])   . BRCA positive   . CAD (coronary artery disease)   . COPD (chronic obstructive pulmonary disease) ([place])   . Emphysema lung ([place])   . GERD (gastroesophageal reflux disease)   . History of colon polyps   . Hypercholesterolemia   . Hyperglycemia   . Hypertension   . Lung nodules   . Osteoporosis   . Personal history of tobacco use, presenting hazards to health 11/25/2014  . Polycythemia vera(238.4)   . Renal cyst   . Skin cancer     Past Surgical History:  Procedure Laterality Date  . ANGIOPLASTY     coronary (x1)  . COLONOSCOPY WITH PROPOFOL N/A 03/02/2015   Procedure: COLONOSCOPY WITH PROPOFOL;  Surgeon: Lollie Sails, MD;  Location: [facility] ENDOSCOPY;  Service: Endoscopy;  Laterality: N/A;  . COLONOSCOPY WITH PROPOFOL N/A 02/13/2017   Procedure: COLONOSCOPY WITH PROPOFOL;  Surgeon: [name], MD;  Location: [facility] ENDOSCOPY;  Service: Endoscopy;  Laterality: N/A;  . CORONARY ANGIOPLASTY    . CORONARY ARTERY BYPASS GRAFT    . CYSTOSCOPY W/ RETROGRADES Bilateral 02/13/2017   Procedure: CYSTOSCOPY WITH RETROGRADE PYELOGRAM;   Surgeon: Hollice Espy, MD;  Location: [facility] ORS;  Service: Urology;  Laterality: Bilateral;  . CYSTOSCOPY WITH BIOPSY  03/26/2017   Procedure: CYSTOSCOPY WITH BIOPSY;  Surgeon: Hollice Espy, MD;  Location: [facility] ORS;  Service: Urology;;  . Consuela Mimes WITH STENT PLACEMENT Bilateral 02/13/2017   Procedure: CYSTOSCOPY WITH STENT PLACEMENT;  Surgeon: Hollice Espy, MD;  Location: [facility] ORS;  Service: Urology;  Laterality: Bilateral;  . CYSTOSCOPY WITH STENT PLACEMENT Bilateral 03/26/2017   Procedure: CYSTOSCOPY WITH STENT EXCHANGE;  Surgeon: Hollice Espy, MD;  Location: [facility] ORS;  Service: Urology;  Laterality: Bilateral;  . ESOPHAGOGASTRODUODENOSCOPY (EGD) WITH PROPOFOL N/A 02/13/2017   Procedure: ESOPHAGOGASTRODUODENOSCOPY (EGD) WITH PROPOFOL;  Surgeon: [name], MD;  Location: [facility] ENDOSCOPY;  Service: Endoscopy;  Laterality: N/A;  . PORTA CATH INSERTION N/A 02/28/2017   Procedure: PORTA CATH INSERTION;  Surgeon: Algernon Huxley, MD;  Location: [place] CV LAB;  Service: Cardiovascular;  Laterality: N/A;  . SALPINGOOPHORECTOMY    . TONSILECTOMY/ADENOIDECTOMY WITH MYRINGOTOMY    . TRANSURETHRAL RESECTION OF BLADDER TUMOR N/A 02/13/2017   Procedure: TRANSURETHRAL RESECTION OF BLADDER TUMOR (TURBT);  Surgeon: Hollice Espy, MD;  Location: [facility] ORS;  Service: Urology;  Laterality: N/A;  . TUBAL LIGATION    . UPPER GI ENDOSCOPY  02/13/2017  . VISCERAL ARTERY INTERVENTION N/A 02/12/2017   Procedure: VISCERAL ARTERY INTERVENTION;  Surgeon: Algernon Huxley, MD;  Location: [place] INVASIVE CV  LAB;  Service: Cardiovascular;  Laterality: N/A;    Social History   Socioeconomic History  . Marital status: Single    Spouse name: Not on file  . Number of children: 2  . Years of education: Not on file  . Highest education level: Not on file  Occupational History  . Not on file  Social Needs  . Financial resource strain: Not on file  . Food insecurity:    Worry: Not on file    Inability: Not on  file  . Transportation needs:    Medical: Not on file    Non-medical: Not on file  Tobacco Use  . Smoking status: Former Smoker    Packs/day: 1.00    Years: 45.00    Pack years: 45.00    Types: Cigarettes    Last attempt to quit: 11/11/2003    Years since quitting: 13.8  . Smokeless tobacco: Never Used  Substance and Sexual Activity  . Alcohol use: No    Alcohol/week: 0.0 oz    Comment: occasional  . Drug use: No  . Sexual activity: Never  Lifestyle  . Physical activity:    Days per week: Not on file    Minutes per session: Not on file  . Stress: Not on file  Relationships  . Social connections:    Talks on phone: Not on file    Gets together: Not on file    Attends religious service: Not on file    Active member of club or organization: Not on file    Attends meetings of clubs or organizations: Not on file    Relationship status: Not on file  . Intimate partner violence:    Fear of current or ex partner: Not on file    Emotionally abused: Not on file    Physically abused: Not on file    Forced sexual activity: Not on file  Other Topics Concern  . Not on file  Social History Narrative  . Not on file    Family History  Problem Relation Age of Onset  . Cirrhosis Father        died age 29  . Alcohol abuse Father   . Asthma Mother   . Congestive Heart Failure Mother   . Breast cancer Mother        2 (1/2 sisters)  . Osteoarthritis Mother   . Colon cancer Mother   . Lupus Sister   . Alcohol abuse Sister   . Ovarian cancer Sister   . Osteoporosis Sister   . Skin cancer Sister   . Breast cancer Cousin   . Breast cancer Maternal Aunt     Current Outpatient Medications:  .  albuterol (PROVENTIL HFA;VENTOLIN HFA) 108 (90 Base) MCG/ACT inhaler, Inhale 2 puffs into the lungs every 6 (six) hours as needed for wheezing or shortness of breath., Disp: 1 Inhaler, Rfl: 2 .  amLODipine (NORVASC) 5 MG tablet, TAKE 1 TABLET BY MOUTH  DAILY, Disp: 90 tablet, Rfl: 1 .   cholecalciferol (VITAMIN D) 1000 units tablet, Take 1,000 Units by mouth daily., Disp: , Rfl:  .  ELIQUIS 5 MG TABS tablet, TAKE 1 TABLET BY MOUTH TWO  TIMES DAILY, Disp: 180 tablet, Rfl: 0 .  fluticasone (FLONASE) 50 MCG/ACT nasal spray, Place 2 sprays into both nostrils daily., Disp: 16 g, Rfl: 5 .  ipratropium-albuterol (DUONEB) 0.5-2.5 (3) MG/3ML SOLN, Take 3 mLs by nebulization every 6 (six) hours as needed (for shortness of breath)., Disp: 36 mL, Rfl:  0 .  lidocaine-prilocaine (EMLA) cream, APPLY TOPICALLY TO AFECTED AREA ONCE AS NEEDED PRIOR TO APPOINMENT, Disp: , Rfl: 3 .  losartan (COZAAR) 100 MG tablet, TAKE 1 TABLET BY MOUTH  DAILY (Patient taking differently: TAKE 189m BY MOUTH  DAILY), Disp: 90 tablet, Rfl: 1 .  lovastatin (MEVACOR) 40 MG tablet, TAKE 1 TABLET BY MOUTH  DAILY, Disp: 90 tablet, Rfl: 0 .  metoprolol tartrate (LOPRESSOR) 25 MG tablet, TAKE ONE-HALF TABLET BY  MOUTH TWO TIMES DAILY (Patient taking differently: TAKE 12.566mBY MOUTH TWO TIMES DAILY), Disp: 90 tablet, Rfl: 1 .  potassium chloride (K-DUR,KLOR-CON) 10 MEQ tablet, Take 1 tablet (10 mEq total) by mouth daily., Disp: 90 tablet, Rfl: 1 .  rosuvastatin (CRESTOR) 20 MG tablet, Take 20 mg by mouth., Disp: , Rfl:  .  SYMBICORT 160-4.5 MCG/ACT inhaler, USE 2 PUFFS TWO TIMES DAILY, Disp: 30.6 g, Rfl: 1 .  CALCIUM CARBONATE PO, Take 1 tablet by mouth every Monday, Wednesday, and Friday., Disp: , Rfl:  .  ondansetron (ZOFRAN) 8 MG tablet, Take 1 tablet (8 mg total) by mouth 2 (two) times daily as needed for refractory nausea / vomiting. Start on day 3 after carboplatin chemo. (Patient not taking: Reported on 06/26/2017), Disp: 30 tablet, Rfl: 1 .  prochlorperazine (COMPAZINE) 10 MG tablet, Take 1 tablet (10 mg total) by mouth every 6 (six) hours as needed (Nausea or vomiting). (Patient not taking: Reported on 04/24/2017), Disp: 30 tablet, Rfl: 1  Physical exam:  Vitals:   09/03/17 1050 09/03/17 1109 09/03/17 1209  BP: (!)  227/82 (!) 199/76 (!) 206/81  Pulse: 72 72   Resp: 20    Temp: 98.2 F (36.8 C)    TempSrc: Tympanic    SpO2: 98%      Physical Exam  Constitutional: She is oriented to person, place, and time.  Elderly, appears stated age. NAD.   HENT:  Head: Normocephalic and atraumatic.  Right Ear: External ear normal.  Left Ear: External ear normal.  Nose: Nose normal.  Mouth/Throat: Oropharynx is clear and moist.  Eyes: Pupils are equal, round, and reactive to light. EOM are normal.  Neck: Normal range of motion. Neck supple.  Cardiovascular: Normal rate and regular rhythm.  Pulmonary/Chest: Effort normal and breath sounds normal.  Abdominal: Soft. She exhibits no distension. There is no tenderness.  Musculoskeletal: She exhibits edema (1+ BLE). She exhibits no deformity.  Neurological: She is alert and oriented to person, place, and time.  Skin: Skin is warm and dry.  Psychiatric: She has a normal mood and affect.      CMP Latest Ref Rng & Units 09/03/2017  Glucose 65 - 99 mg/dL 131(H)  BUN 6 - 20 mg/dL 24(H)  Creatinine 0.44 - 1.00 mg/dL 0.94  Sodium 135 - 145 mmol/L 141  Potassium 3.5 - 5.1 mmol/L 4.1  Chloride 101 - 111 mmol/L 106  CO2 22 - 32 mmol/L 25  Calcium 8.9 - 10.3 mg/dL 9.5  Total Protein 6.5 - 8.1 g/dL 6.4(L)  Total Bilirubin 0.3 - 1.2 mg/dL 1.0  Alkaline Phos 38 - 126 U/L 60  AST 15 - 41 U/L 20  ALT 14 - 54 U/L 15   CBC Latest Ref Rng & Units 09/03/2017  WBC 3.6 - 11.0 K/uL 8.2  Hemoglobin 12.0 - 16.0 g/dL 15.0  Hematocrit 35.0 - 47.0 % 43.0  Platelets 150 - 440 K/uL 151     No results found.   Assessment and plan- Patient is a 83  y.o. female who [facility] with complaints of lightheadedness and weakness. On exam she was found to be significantly hypertensive.   1. Small Cell Carcinoma of the bladder- non-metastatic- currently s/p 4 cycles of carbo + Etoposide and XRT, with resolution of bladder mass. Follow up with Dr. Janese Banks as  scheduled on 09/11/17.   2. Hypertension- Uncontrolled- BP 206/81 in clinic. Patient reports compliance with medication. Attempted to contact Cardiology and Primary Care but calls not returned within 2 hours. Per recommendation of Dr. Janese Banks, patient referred to ER for further evaluation and management.   3. Dizziness- symptoms exacerbated by position changes. Etiology- hypertension? Medication side effect? Dehydration? Discussed acute management of hypertension and following up with PCP and/or Cardiology for further evaluation.   Visit Diagnosis 1. Essential hypertension   2. Small cell carcinoma of bladder ([place])   3. Dizziness     A total of (25) minutes of face-to-face time was spent with this patient with greater than 50% of that time in counseling and care-coordination.   Beckey Rutter, DNP, AGNP-C [place] [facility] [phone] (work cell) [phone] (office) 09/03/17 1:24 PM

## 2017-09-03 NOTE — Progress Notes (Signed)
Patient here as an acute add on today to see NP for symptoms of vertigo, according to the radiation RN. Patient was here today for a routine follow up with Dr. Baruch Gouty, status post 1 month radiation completion. Patient states that she started having these symptoms shortly after starting radiation treatments, and they have not gone away since completing radiation. She denies being dizzy, and reports more of a wobbly feeling, especially when she goes from sitting to standing/walking. She says it usually "levels out" once she takes 20-30 steps. Her BP was checked sitting and standing, and it shows no significant change. Her diastolic BP is very high in the 220 range, but this is not new for this patient. Patient was observed climbing the stairs today in the [facility], and her gait appeared steady.

## 2017-09-03 NOTE — Discharge Instructions (Addendum)
Please follow-up with your primary doctor.  Return to the ER or seek medical care if you develop any shortness of breath chest pain, severe headache, numbness or weakness to one side of your body or for any additional questions or concerns.

## 2017-09-03 NOTE — Telephone Encounter (Signed)
Please advise 

## 2017-09-03 NOTE — ED Provider Notes (Signed)
[facility] Emergency Department Provider Note    First MD Initiated Contact with Patient 09/03/17 1516     (approximate)  I have reviewed the triage vital signs and the nursing notes.   HISTORY  Chief Complaint Hypertension    HPI Tina [name] is a 83 y.o. female with extensive past medical history currently any from the cancer center with elevated blood pressure.  Patient denies any chest pain or shortness of breath.  No worsening lower extremity swelling.  She is compliant with her home medications.  States that she has been dizzy ever since they started radiation therapy but denies any lateralizing weakness or blurry vision.  No recent changes to any medications.  Past Medical History:  Diagnosis Date  . Anemia   . Asthma   . Bladder cancer ([place])   . BRCA positive   . CAD (coronary artery disease)   . COPD (chronic obstructive pulmonary disease) ([place])   . Emphysema lung ([place])   . GERD (gastroesophageal reflux disease)   . History of colon polyps   . Hypercholesterolemia   . Hyperglycemia   . Hypertension   . Lung nodules   . Osteoporosis   . Personal history of tobacco use, presenting hazards to health 11/25/2014  . Polycythemia vera(238.4)   . Renal cyst   . Skin cancer    Family History  Problem Relation Age of Onset  . Cirrhosis Father        died age 17  . Alcohol abuse Father   . Asthma Mother   . Congestive Heart Failure Mother   . Breast cancer Mother        2 (1/2 sisters)  . Osteoarthritis Mother   . Colon cancer Mother   . Lupus Sister   . Alcohol abuse Sister   . Ovarian cancer Sister   . Osteoporosis Sister   . Skin cancer Sister   . Breast cancer Cousin   . Breast cancer Maternal Aunt    Past Surgical History:  Procedure Laterality Date  . ANGIOPLASTY     coronary (x1)  . COLONOSCOPY WITH PROPOFOL N/A 03/02/2015   Procedure: COLONOSCOPY WITH PROPOFOL;  Surgeon: Lollie Sails, MD;  Location: [facility] ENDOSCOPY;   Service: Endoscopy;  Laterality: N/A;  . COLONOSCOPY WITH PROPOFOL N/A 02/13/2017   Procedure: COLONOSCOPY WITH PROPOFOL;  Surgeon: [name], MD;  Location: [facility] ENDOSCOPY;  Service: Endoscopy;  Laterality: N/A;  . CORONARY ANGIOPLASTY    . CORONARY ARTERY BYPASS GRAFT    . CYSTOSCOPY W/ RETROGRADES Bilateral 02/13/2017   Procedure: CYSTOSCOPY WITH RETROGRADE PYELOGRAM;  Surgeon: Hollice Espy, MD;  Location: [facility] ORS;  Service: Urology;  Laterality: Bilateral;  . CYSTOSCOPY WITH BIOPSY  03/26/2017   Procedure: CYSTOSCOPY WITH BIOPSY;  Surgeon: Hollice Espy, MD;  Location: [facility] ORS;  Service: Urology;;  . Consuela Mimes WITH STENT PLACEMENT Bilateral 02/13/2017   Procedure: CYSTOSCOPY WITH STENT PLACEMENT;  Surgeon: Hollice Espy, MD;  Location: [facility] ORS;  Service: Urology;  Laterality: Bilateral;  . CYSTOSCOPY WITH STENT PLACEMENT Bilateral 03/26/2017   Procedure: CYSTOSCOPY WITH STENT EXCHANGE;  Surgeon: Hollice Espy, MD;  Location: [facility] ORS;  Service: Urology;  Laterality: Bilateral;  . ESOPHAGOGASTRODUODENOSCOPY (EGD) WITH PROPOFOL N/A 02/13/2017   Procedure: ESOPHAGOGASTRODUODENOSCOPY (EGD) WITH PROPOFOL;  Surgeon: [name], MD;  Location: [facility] ENDOSCOPY;  Service: Endoscopy;  Laterality: N/A;  . PORTA CATH INSERTION N/A 02/28/2017   Procedure: PORTA CATH INSERTION;  Surgeon: Algernon Huxley, MD;  Location: [facility] INVASIVE CV  LAB;  Service: Cardiovascular;  Laterality: N/A;  . SALPINGOOPHORECTOMY    . TONSILECTOMY/ADENOIDECTOMY WITH MYRINGOTOMY    . TRANSURETHRAL RESECTION OF BLADDER TUMOR N/A 02/13/2017   Procedure: TRANSURETHRAL RESECTION OF BLADDER TUMOR (TURBT);  Surgeon: Hollice Espy, MD;  Location: [facility] ORS;  Service: Urology;  Laterality: N/A;  . TUBAL LIGATION    . UPPER GI ENDOSCOPY  02/13/2017  . VISCERAL ARTERY INTERVENTION N/A 02/12/2017   Procedure: VISCERAL ARTERY INTERVENTION;  Surgeon: Algernon Huxley, MD;  Location: [place] CV LAB;  Service: Cardiovascular;   Laterality: N/A;   Patient Active Problem List   Diagnosis Date Noted  . Pulmonary embolus ([place]) 06/28/2017  . 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 Goals of care, counseling/discussion 03/02/2017  . Abnormal computed tomography of gastrointestinal tract   . Noninfectious diarrhea   . Ulceration of intestine   . Hernia, hiatal   . Bladder mass   . Mesenteric ischemia due to arterial insufficiency ([place])   . Nausea without vomiting   . Abdominal pain 02/11/2017  . Nausea 02/08/2017  . Diarrhea 02/08/2017  . Hematuria 02/08/2017  . Acute bronchitis 06/14/2016  . Elevated troponin 06/14/2016  . CKD (chronic kidney disease), stage III ([place]) 06/14/2016  . Leukocytosis 06/14/2016  . Abdominal bruit 04/16/2016  . Abnormal mammogram 06/06/2015  . Essential hypertension 02/16/2015  . Numbness 01/24/2015  . Personal history of tobacco use, presenting hazards to health 11/25/2014  . BRCA positive   . COPD exacerbation ([place]) 11/14/2014  . Sensation of pressure in bladder area 08/15/2014  . Health care maintenance 08/15/2014  . Awareness of heartbeats 09/30/2013  . Presence of coronary angioplasty implant and graft 09/30/2013  . Acute confusional state 08/10/2013  . History of colonic polyps 08/25/2012  . Female genuine stress incontinence 06/28/2012  . Infection of urinary tract 06/28/2012  . CAD (coronary artery disease) 05/23/2012  . COPD (chronic obstructive pulmonary disease) ([place]) 05/23/2012  . Hypertension 05/23/2012  . Hypercholesterolemia 05/23/2012  . Hyperglycemia 05/23/2012  . Polycythemia, secondary 05/23/2012  . Renal cyst 05/23/2012  . Osteoporosis 05/23/2012      Prior to Admission medications   Medication Sig Start Date End Date Taking? Authorizing Provider  albuterol (PROVENTIL HFA;VENTOLIN HFA) 108 (90 Base) MCG/ACT inhaler Inhale 2 puffs into the lungs every 6 (six) hours as needed for wheezing or shortness of breath. 08/23/16   Gladstone Lighter, MD  amLODipine (NORVASC) 5 MG tablet TAKE 1 TABLET BY MOUTH  DAILY 02/02/17   Einar Pheasant, MD  CALCIUM CARBONATE PO Take 1 tablet by mouth every Monday, Wednesday, and Friday.    [provider]  cholecalciferol (VITAMIN D) 1000 units tablet Take 1,000 Units by mouth daily.    [provider]  ELIQUIS 5 MG TABS tablet TAKE 1 TABLET BY MOUTH TWO  TIMES DAILY 07/31/17   Sindy Guadeloupe, MD  fluticasone [facility]) 50 MCG/ACT nasal spray Place 2 sprays into both nostrils daily. 01/04/17   Einar Pheasant, MD  ipratropium-albuterol (DUONEB) 0.5-2.5 (3) MG/3ML SOLN Take 3 mLs by nebulization every 6 (six) hours as needed (for shortness of breath). 11/14/14   Joanne Gavel, MD  lidocaine-prilocaine (EMLA) cream APPLY TOPICALLY TO AFECTED AREA ONCE AS NEEDED PRIOR TO APPOINMENT 03/02/17   [provider]  losartan (COZAAR) 100 MG tablet TAKE 1 TABLET BY MOUTH  DAILY Patient taking differently: TAKE 121m BY MOUTH  DAILY 01/04/17   SEinar Pheasant MD  lovastatin (MEVACOR) 40 MG tablet TAKE 1  TABLET BY MOUTH  DAILY 06/25/17   Einar Pheasant, MD  metoprolol tartrate (LOPRESSOR) 25 MG tablet TAKE ONE-HALF TABLET BY  MOUTH TWO TIMES DAILY Patient taking differently: TAKE 12.80m BY MOUTH TWO TIMES DAILY 01/04/17   SEinar Pheasant MD  ondansetron (ZOFRAN) 8 MG tablet Take 1 tablet (8 mg total) by mouth 2 (two) times daily as needed for refractory nausea / vomiting. Start on day 3 after carboplatin chemo. Patient not taking: Reported on 06/26/2017 03/02/17   RSindy Guadeloupe MD  potassium chloride (K-DUR,KLOR-CON) 10 MEQ tablet Take 1 tablet (10 mEq total) by mouth daily. 05/30/17   SEinar Pheasant MD  prochlorperazine (COMPAZINE) 10 MG tablet Take 1 tablet (10 mg total) by mouth every 6 (six) hours as needed (Nausea or vomiting). Patient not taking: Reported on 04/24/2017 03/02/17   RSindy Guadeloupe MD  rosuvastatin (CRESTOR) 20 MG tablet Take 20 mg by mouth. 06/28/12   [provider]  SYMBICORT 160-4.5 MCG/ACT inhaler USE 2 PUFFS TWO TIMES DAILY 02/02/17   SEinar Pheasant MD    Allergies Evista [raloxifene] and Fluticasone-salmeterol    Social History Social History   Tobacco Use  . Smoking status: Former Smoker    Packs/day: 1.00    Years: 45.00    Pack years: 45.00    Types: Cigarettes    Last attempt to quit: 11/11/2003    Years since quitting: 13.8  . Smokeless tobacco: Never Used  Substance Use Topics  . Alcohol use: No    Alcohol/week: 0.0 oz    Comment: occasional  . Drug use: No    Review of Systems Patient denies headaches, rhinorrhea, blurry vision, numbness, shortness of breath, chest pain, edema, cough, abdominal pain, nausea, vomiting, diarrhea, dysuria, fevers, rashes or hallucinations unless otherwise stated above in HPI. ____________________________________________   PHYSICAL EXAM:  VITAL SIGNS: Vitals:   09/03/17 1334  BP: (!) 190/78  Pulse: 67  Resp: 18  Temp: 98.5 F (36.9 C)  SpO2: 97%    Constitutional: Alert and oriented. Well appearing and in no acute distress. Eyes: Conjunctivae are normal.  Head: Atraumatic. Nose: No congestion/rhinnorhea. Mouth/Throat: Mucous membranes are moist.   Neck: No stridor. Painless ROM.  Cardiovascular: Normal rate, regular rhythm. Grossly normal heart sounds.  Good peripheral circulation. Respiratory: Normal respiratory effort.  No retractions. Lungs CTAB. Gastrointestinal: Soft and nontender. No distention. No abdominal bruits. No CVA tenderness. Genitourinary:  Musculoskeletal: No lower extremity tenderness, trace bilateral edema.  No joint effusions. Neurologic:  Normal speech and language. No gross focal neurologic deficits are appreciated. No facial droop Skin:  Skin is warm, dry and intact. No rash noted. Psychiatric: Mood and affect are normal. Speech and behavior are normal.  ____________________________________________   LABS (all labs ordered are listed,  but only abnormal results are displayed)  Results for orders placed or performed in visit on 09/03/17 (from the past 24 hour(s))  Comprehensive metabolic panel     Status: Abnormal   Collection Time: 09/03/17 10:20 AM  Result Value Ref Range   Sodium 141 135 - 145 mmol/L   Potassium 4.1 3.5 - 5.1 mmol/L   Chloride 106 101 - 111 mmol/L   CO2 25 22 - 32 mmol/L   Glucose, Bld 131 (H) 65 - 99 mg/dL   BUN 24 (H) 6 - 20 mg/dL   Creatinine, Ser 0.94 0.44 - 1.00 mg/dL   Calcium 9.5 8.9 - 10.3 mg/dL   Total Protein 6.4 (L) 6.5 - 8.1 g/dL   Albumin  3.9 3.5 - 5.0 g/dL   AST 20 15 - 41 U/L   ALT 15 14 - 54 U/L   Alkaline Phosphatase 60 38 - 126 U/L   Total Bilirubin 1.0 0.3 - 1.2 mg/dL   GFR calc non Af Amer 56 (L) >60 mL/min   GFR calc Af Amer >60 >60 mL/min   Anion gap 10 5 - 15  CBC with Differential     Status: None   Collection Time: 09/03/17 10:20 AM  Result Value Ref Range   WBC 8.2 3.6 - 11.0 K/uL   RBC 4.70 3.80 - 5.20 MIL/uL   Hemoglobin 15.0 12.0 - 16.0 g/dL   HCT 43.0 35.0 - 47.0 %   MCV 91.5 80.0 - 100.0 fL   MCH 32.0 26.0 - 34.0 pg   MCHC 35.0 32.0 - 36.0 g/dL   RDW 12.9 11.5 - 14.5 %   Platelets 151 150 - 440 K/uL   Neutrophils Relative % 80 %   Neutro Abs 6.5 1.4 - 6.5 K/uL   Lymphocytes Relative 12 %   Lymphs Abs 1.0 1.0 - 3.6 K/uL   Monocytes Relative 6 %   Monocytes Absolute 0.5 0.2 - 0.9 K/uL   Eosinophils Relative 2 %   Eosinophils Absolute 0.2 0 - 0.7 K/uL   Basophils Relative 0 %   Basophils Absolute 0.0 0 - 0.1 K/uL   ____________________________________________ ______________________________  RADIOLOGY   ____________________________________________   PROCEDURES  Procedure(s) performed:  Procedures    Critical Care performed: no ____________________________________________   INITIAL IMPRESSION / ASSESSMENT AND PLAN / ED COURSE  Pertinent labs & imaging results that were available during my care of the patient were reviewed by me and  considered in my medical decision making (see chart for details).  DDX: htn, chf, noncompliance  Tina [name] is a 83 y.o. who presents to the ED with a symptomatic hypertension.  Patient states that she is been compliant with her medications.  Has not had much to eat today.  Denies any chest pain or shortness of breath.  Her neuro exam is nonfocal.  She has a long-standing history of hypertension I do not feel that additional diagnostic testing clinically indicated.  We will give her home medications.  Her blood pressure with slight improvement.  Patient is up ambulating about the ER in no acute distress tolerating oral hydration.  At this point do believe patient is stable and appropriate for follow-up with her PCP.      As part of my medical decision making, I reviewed the following data within the [place] notes reviewed and incorporated, Labs reviewed, notes from prior ED visits.  ____________________________________________   FINAL CLINICAL IMPRESSION(S) / ED DIAGNOSES  Final diagnoses:  Hypertension, unspecified type      NEW MEDICATIONS STARTED DURING THIS VISIT:  New Prescriptions   No medications on file     Note:  This document was prepared using Dragon voice recognition software and may include unintentional dictation errors.    Merlyn Lot, MD 09/03/17 1620

## 2017-09-03 NOTE — Telephone Encounter (Signed)
Copied from [place]. Topic: Appointment Scheduling - Scheduling Inquiry for Clinic >> Sep 03, 2017  4:53 PM Vernona Rieger wrote: Patient was seen in the ER today 4/22 for hypertension. She needs to schedule a ER follow up. She is a patient of Dr Nicki Reaper & she does not have anything until July. Please call back @ [phone]

## 2017-09-03 NOTE — ED Triage Notes (Signed)
Pt presents to ED c/o HTN. Pt states she was seen at cancer center earlier today and had BP 206/81. Cancer center told pt they could treat the BP if she was able to follow-up with PCP today. PCP said they had no openings and referred pt to ED for treatment of BP. Pt reports compliance with prescribed metoprolol/cozaar/norvasc. Pt has no other complaints. Blood drawn at cancer center this morning.

## 2017-09-04 NOTE — Telephone Encounter (Signed)
Please advise where to schedule.

## 2017-09-04 NOTE — Telephone Encounter (Signed)
Pt scheduled for May 24 at 12

## 2017-09-04 NOTE — Telephone Encounter (Signed)
If she is seeing Dr Saralyn Pilar, I am ok with this.  She will need to let us know if any problems.

## 2017-09-04 NOTE — Telephone Encounter (Signed)
How soon would you like to see pt?

## 2017-09-04 NOTE — Telephone Encounter (Signed)
Confirm pt doing ok.  I can see her 10:30 09/06/17.

## 2017-09-04 NOTE — Telephone Encounter (Signed)
Patient is seeing Dr. Saralyn Pilar at 10:15 in the morning and has CT scan on 4/26. Patient would like to know if it would be ok for her to just see him and then schedule regular f/u with you so she does not have to go to the doctor 3 days in a row.

## 2017-09-05 ENCOUNTER — Emergency Department
Admission: EM | Admit: 2017-09-05 | Discharge: 2017-09-05 | Disposition: A | Discharge status: Home / Self Care | Payer: Medicare Other | Attending: Emergency Medicine | Admitting: Emergency Medicine

## 2017-09-05 ENCOUNTER — Other Ambulatory Visit: Payer: Self-pay

## 2017-09-05 DIAGNOSIS — Z87891 Personal history of nicotine dependence: Secondary | ICD-10-CM | POA: Diagnosis not present

## 2017-09-05 DIAGNOSIS — I10 Essential (primary) hypertension: Secondary | ICD-10-CM | POA: Diagnosis not present

## 2017-09-05 DIAGNOSIS — I129 Hypertensive chronic kidney disease with stage 1 through stage 4 chronic kidney disease, or unspecified chronic kidney disease: Secondary | ICD-10-CM | POA: Insufficient documentation

## 2017-09-05 DIAGNOSIS — R42 Dizziness and giddiness: Secondary | ICD-10-CM | POA: Diagnosis not present

## 2017-09-05 DIAGNOSIS — N183 Chronic kidney disease, stage 3 (moderate): Secondary | ICD-10-CM | POA: Diagnosis not present

## 2017-09-05 DIAGNOSIS — I2510 Atherosclerotic heart disease of native coronary artery without angina pectoris: Secondary | ICD-10-CM | POA: Insufficient documentation

## 2017-09-05 DIAGNOSIS — Z955 Presence of coronary angioplasty implant and graft: Secondary | ICD-10-CM | POA: Insufficient documentation

## 2017-09-05 DIAGNOSIS — J449 Chronic obstructive pulmonary disease, unspecified: Secondary | ICD-10-CM | POA: Insufficient documentation

## 2017-09-05 DIAGNOSIS — Z951 Presence of aortocoronary bypass graft: Secondary | ICD-10-CM | POA: Insufficient documentation

## 2017-09-05 LAB — COMPREHENSIVE METABOLIC PANEL
ALT: 14 U/L (ref 14–54)
AST: 21 U/L (ref 15–41)
Albumin: 4.4 g/dL (ref 3.5–5.0)
Alkaline Phosphatase: 66 U/L (ref 38–126)
Anion gap: 8 (ref 5–15)
BUN: 21 mg/dL — AB (ref 6–20)
CO2: 24 mmol/L (ref 22–32)
Calcium: 9.5 mg/dL (ref 8.9–10.3)
Chloride: 108 mmol/L (ref 101–111)
Creatinine, Ser: 0.94 mg/dL (ref 0.44–1.00)
GFR calc Af Amer: >60 mL/min (ref >60)
GFR, EST NON AFRICAN AMERICAN: 56 mL/min — AB (ref >60)
Glucose, Bld: 105 mg/dL — ABNORMAL HIGH (ref 65–99)
Potassium: 3.8 mmol/L (ref 3.5–5.1)
Sodium: 140 mmol/L (ref 135–145)
Total Bilirubin: 0.7 mg/dL (ref 0.3–1.2)
Total Protein: 7.2 g/dL (ref 6.5–8.1)

## 2017-09-05 LAB — CBC WITH DIFFERENTIAL/PLATELET
BASOS ABS: 0 10*3/uL (ref 0–0.1)
BASOS PCT: 0 %
EOS ABS: 0.3 10*3/uL (ref 0–0.7)
Eosinophils Relative: 3 %
HCT: 46 % (ref 35.0–47.0)
Hemoglobin: 15.9 g/dL (ref 12.0–16.0)
LYMPHS PCT: 11 %
Lymphs Abs: 1.1 10*3/uL (ref 1.0–3.6)
MCH: 32 pg (ref 26.0–34.0)
MCHC: 34.6 g/dL (ref 32.0–36.0)
MCV: 92.7 fL (ref 80.0–100.0)
MONO ABS: 1 10*3/uL — AB (ref 0.2–0.9)
Monocytes Relative: 10 %
Neutro Abs: 7.6 10*3/uL — ABNORMAL HIGH (ref 1.4–6.5)
Neutrophils Relative %: 76 %
Platelets: 152 10*3/uL (ref 150–440)
RBC: 4.96 MIL/uL (ref 3.80–5.20)
RDW: 13.4 % (ref 11.5–14.5)
WBC: 10 10*3/uL (ref 3.6–11.0)

## 2017-09-05 LAB — TROPONIN I

## 2017-09-05 MED ORDER — METOPROLOL TARTRATE 25 MG PO TABS: 25.0000 mg | ORAL_TABLET | Freq: Two times a day (BID) | ORAL | 0 refills | Status: DC

## 2017-09-05 MED ORDER — METOPROLOL TARTRATE 25 MG PO TABS
25.0000 mg | ORAL_TABLET | Freq: Once | ORAL | Status: AC
  Administered 2017-09-05: 25 mg via ORAL
  Filled 2017-09-05: qty 1

## 2017-09-05 NOTE — ED Notes (Signed)
FIRST NURSE NOTE-walking around lobby without distress. Alert and oriented.

## 2017-09-05 NOTE — Discharge Instructions (Signed)
Please change her metoprolol to 25 mg by mouth twice daily.  Please make an appointment with your primary care physician tomorrow to reevaluate your blood pressure medications.  If you are unable to be seen by your physician, please ask that your physician call you back with blood pressure medication recommendations.  Turn to the emergency department if you develop severe pain, lightheadedness or fainting, chest pain, shortness of breath, numbness tingling or weakness, changes in vision or speech, changes in mental status, or any other symptoms concerning to you.

## 2017-09-05 NOTE — ED Provider Notes (Signed)
[facility] Emergency Department Provider Note  ____________________________________________  Time seen: Approximately 9:39 PM  I have reviewed the triage vital signs and the nursing notes.   HISTORY  Chief Complaint Hypertension and Dizziness    HPI Tina [name] is a 83 y.o. female with a history of hypertension presenting for asymptomatic hypertension.  The patient was seen and evaluated in the emergency department yesterday by Dr. Quentin Cornwall for hypertension.  She had a reassuring work-up and was discharged home with instructions to call her primary care physician for an appointment for medication management changes.  Unfortunately, the patient's primary care physician was unable to see her today, and only was able to schedule an appointment in May.  Today, the patient states that she has been feeling at her baseline.  She has not had any symptoms, including chest pain, shortness of breath, lightheadedness or syncope, changes in her vision, changes in mental status or speech, numbness tingling or weakness.  She routinely was checking her blood blood pressure and had a systolic over 707 and diastolic over 615.  The patient has not taken her evening dose of antihypertensives.  Past Medical History:  Diagnosis Date  . Anemia   . Asthma   . Bladder cancer ([place])   . BRCA positive   . CAD (coronary artery disease)   . COPD (chronic obstructive pulmonary disease) ([place])   . Emphysema lung ([place])   . GERD (gastroesophageal reflux disease)   . History of colon polyps   . Hypercholesterolemia   . Hyperglycemia   . Hypertension   . Lung nodules   . Osteoporosis   . Personal history of tobacco use, presenting hazards to health 11/25/2014  . Polycythemia vera(238.4)   . Renal cyst   . Skin cancer     Patient Active Problem List   Diagnosis Date Noted  . Pulmonary embolus ([place]) 06/28/2017  . 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 Goals of care,  counseling/discussion 03/02/2017  . Abnormal computed tomography of gastrointestinal tract   . Noninfectious diarrhea   . Ulceration of intestine   . Hernia, hiatal   . Bladder mass   . Mesenteric ischemia due to arterial insufficiency ([place])   . Nausea without vomiting   . Abdominal pain 02/11/2017  . Nausea 02/08/2017  . Diarrhea 02/08/2017  . Hematuria 02/08/2017  . Acute bronchitis 06/14/2016  . Elevated troponin 06/14/2016  . CKD (chronic kidney disease), stage III ([place]) 06/14/2016  . Leukocytosis 06/14/2016  . Abdominal bruit 04/16/2016  . Abnormal mammogram 06/06/2015  . Essential hypertension 02/16/2015  . Numbness 01/24/2015  . Personal history of tobacco use, presenting hazards to health 11/25/2014  . BRCA positive   . COPD exacerbation ([place]) 11/14/2014  . Sensation of pressure in bladder area 08/15/2014  . Health care maintenance 08/15/2014  . Awareness of heartbeats 09/30/2013  . Presence of coronary angioplasty implant and graft 09/30/2013  . Acute confusional state 08/10/2013  . History of colonic polyps 08/25/2012  . Female genuine stress incontinence 06/28/2012  . Infection of urinary tract 06/28/2012  . CAD (coronary artery disease) 05/23/2012  . COPD (chronic obstructive pulmonary disease) ([place]) 05/23/2012  . Hypertension 05/23/2012  . Hypercholesterolemia 05/23/2012  . Hyperglycemia 05/23/2012  . Polycythemia, secondary 05/23/2012  . Renal cyst 05/23/2012  . Osteoporosis 05/23/2012    Past Surgical History:  Procedure Laterality Date  . ANGIOPLASTY     coronary (x1)  . COLONOSCOPY WITH PROPOFOL N/A 03/02/2015   Procedure: COLONOSCOPY  WITH PROPOFOL;  Surgeon: Lollie Sails, MD;  Location[facility] ENDOSCOPY;  Service: Endoscopy;  Laterality: N/A;  . COLONOSCOPY WITH PROPOFOL N/A 02/13/2017   Procedure: COLONOSCOPY WITH PROPOFOL;  Surgeon: [name], MD;  Location: [facility] ENDOSCOPY;  Service: Endoscopy;  Laterality: N/A;  . CORONARY ANGIOPLASTY    .  CORONARY ARTERY BYPASS GRAFT    . CYSTOSCOPY W/ RETROGRADES Bilateral 02/13/2017   Procedure: CYSTOSCOPY WITH RETROGRADE PYELOGRAM;  Surgeon: Hollice Espy, MD;  Location: [facility] ORS;  Service: Urology;  Laterality: Bilateral;  . CYSTOSCOPY WITH BIOPSY  03/26/2017   Procedure: CYSTOSCOPY WITH BIOPSY;  Surgeon: Hollice Espy, MD;  Location: [facility] ORS;  Service: Urology;;  . Consuela Mimes WITH STENT PLACEMENT Bilateral 02/13/2017   Procedure: CYSTOSCOPY WITH STENT PLACEMENT;  Surgeon: Hollice Espy, MD;  Location: [facility] ORS;  Service: Urology;  Laterality: Bilateral;  . CYSTOSCOPY WITH STENT PLACEMENT Bilateral 03/26/2017   Procedure: CYSTOSCOPY WITH STENT EXCHANGE;  Surgeon: Hollice Espy, MD;  Location: [facility] ORS;  Service: Urology;  Laterality: Bilateral;  . ESOPHAGOGASTRODUODENOSCOPY (EGD) WITH PROPOFOL N/A 02/13/2017   Procedure: ESOPHAGOGASTRODUODENOSCOPY (EGD) WITH PROPOFOL;  Surgeon: [name], MD;  Location[facility] ENDOSCOPY;  Service: Endoscopy;  Laterality: N/A;  . PORTA CATH INSERTION N/A 02/28/2017   Procedure: PORTA CATH INSERTION;  Surgeon: Algernon Huxley, MD;  Location: [place] CV LAB;  Service: Cardiovascular;  Laterality: N/A;  . SALPINGOOPHORECTOMY    . TONSILECTOMY/ADENOIDECTOMY WITH MYRINGOTOMY    . TRANSURETHRAL RESECTION OF BLADDER TUMOR N/A 02/13/2017   Procedure: TRANSURETHRAL RESECTION OF BLADDER TUMOR (TURBT);  Surgeon: Hollice Espy, MD;  Location: [facility] ORS;  Service: Urology;  Laterality: N/A;  . TUBAL LIGATION    . UPPER GI ENDOSCOPY  02/13/2017  . VISCERAL ARTERY INTERVENTION N/A 02/12/2017   Procedure: VISCERAL ARTERY INTERVENTION;  Surgeon: Algernon Huxley, MD;  Location: [place] CV LAB;  Service: Cardiovascular;  Laterality: N/A;    Current Outpatient Rx  . Order #: [id number] Class: Normal  . Order #: [id number] Class: Normal  . Order #: [id number] Class: Historical Med  . Order #: [id number] Class: Historical Med  . Order #: [id number] Class: Normal  . Order  #: [id number] Class: Normal  . Order #: [id number] Class: Print  . Order #: [id number] Class: Historical Med  . Order #: [id number] Class: Normal  . Order #: [id number] Class: Normal  . Order #: [id number] Class: Print  . Order #: [id number] Class: Normal  . Order #: [id number] Class: Normal  . Order #: [id number] Class: Normal  . Order #: [id number] Class: Historical Med  . Order #: [id number] Class: Normal    Allergies Evista [raloxifene] and Fluticasone-salmeterol  Family History  Problem Relation Age of Onset  . Cirrhosis Father        died age 36  . Alcohol abuse Father   . Asthma Mother   . Congestive Heart Failure Mother   . Breast cancer Mother        2 (1/2 sisters)  . Osteoarthritis Mother   . Colon cancer Mother   . Lupus Sister   . Alcohol abuse Sister   . Ovarian cancer Sister   . Osteoporosis Sister   . Skin cancer Sister   . Breast cancer Cousin   . Breast cancer Maternal Aunt     Social History Social History   Tobacco Use  . Smoking status: Former Smoker    Packs/day: 1.00    Years: 45.00    Pack years: 45.00    Types: Cigarettes    Last attempt to quit: 11/11/2003    Years since quitting:  13.8  . Smokeless tobacco: Never Used  Substance Use Topics  . Alcohol use: No    Alcohol/week: 0.0 oz    Comment: occasional  . Drug use: No    Review of Systems Constitutional: No fever/chills.  No lightheadedness or syncope. Eyes: No visual changes; the patient has blurred vision at baseline from prior chemotherapy changes.. ENT: No sore throat. No congestion or rhinorrhea. Cardiovascular: Denies chest pain. Denies palpitations.  Positive hypertension. Respiratory: Denies shortness of breath.  No cough. Gastrointestinal: No abdominal pain.  No nausea, no vomiting.  No diarrhea.  No constipation. Genitourinary: Negative for dysuria. Musculoskeletal: Negative for back pain.  No lower extremity swelling or calf pain. Skin: Negative for rash. Neurological: Negative for  headaches. No focal numbness, tingling or weakness.     ____________________________________________   PHYSICAL EXAM:  VITAL SIGNS: ED Triage Vitals [09/05/17 1710]  Enc Vitals Group     BP (!) 216/72     Pulse Rate 94     Resp 14     Temp 98 F (36.7 C)     Temp Source Oral     SpO2 99 %     Weight 132 lb (59.9 kg)     Height '5\' 5"'$  (1.651 m)     Head Circumference      Peak Flow      Pain Score 0     Pain Loc      Pain Edu?      Excl. in [place]?     Constitutional: Alert and oriented.  Chronically ill appearing and in no acute distress. Answers questions appropriately. Eyes: Conjunctivae are normal.  EOMI. No scleral icterus. Head: Atraumatic. Nose: No congestion/rhinnorhea. Mouth/Throat: Mucous membranes are moist.  Neck: No stridor.  Supple.  No JVD.  No meningismus. Cardiovascular: Normal rate, regular rhythm. No murmurs, rubs or gallops.  Respiratory: Normal respiratory effort.  No accessory muscle use or retractions. Lungs CTAB.  No wheezes, rales or ronchi. Gastrointestinal: Soft, nontender and nondistended.  No guarding or rebound.  No peritoneal signs. Musculoskeletal: No LE edema. No ttp in the calves or palpable cords.  Negative Homan's sign. Neurologic:  A&Ox3.  Speech is clear.  Face and smile are symmetric.  EOMI.  Moves all extremities well. Skin:  Skin is warm, dry and intact. No rash noted. Psychiatric: Mood and affect are normal.   ____________________________________________   LABS (all labs ordered are listed, but only abnormal results are displayed)  Labs Reviewed  CBC WITH DIFFERENTIAL/PLATELET - Abnormal; Notable for the following components:      Result Value   Neutro Abs 7.6 (*)    Monocytes Absolute 1.0 (*)    All other components within normal limits  COMPREHENSIVE METABOLIC PANEL - Abnormal; Notable for the following components:   Glucose, Bld 105 (*)    BUN 21 (*)    GFR calc non Af Amer 56 (*)    All other components within normal  limits  TROPONIN I  URINALYSIS, COMPLETE (UACMP) WITH MICROSCOPIC  CBG MONITORING, ED   ____________________________________________  EKG  ED ECG REPORT I, Eula Listen, the attending physician, personally viewed and interpreted this ECG.   Date: 09/05/2017  EKG Time: 1719  Rate: 89  Rhythm: normal sinus rhythm  Axis: normal  Intervals:none  ST&T Change: No STEMI  ____________________________________________  RADIOLOGY  No results found.  ____________________________________________   PROCEDURES  Procedure(s) performed: None  Procedures  Critical Care performed: No ____________________________________________   INITIAL IMPRESSION / ASSESSMENT AND  PLAN / ED COURSE  Pertinent labs & imaging results that were available during my care of the patient were reviewed by me and considered in my medical decision making (see chart for details).  83 y.o. female with a history of hypertension presenting with asymptomatic hypertension.  Overall, the patient has no signs or symptoms consistent with any acute hypertensive emergency, including CVA, ACS or MI, severe renal insufficiency.  It is likely that her essential hypertension is worsening and that she needs medication management.  I have encouraged her to call her primary care physician back tomorrow and explain that she needs an appointment for an ER follow-up.  Here, I will give the patient 25 mg of metoprolol and increase her baseline metoprolol to 25 mg twice daily, up from 12.5 mg twice daily.  She will also continue her other medications as prescribed.  She understands follow-up instructions as well as return precautions.  ____________________________________________  FINAL CLINICAL IMPRESSION(S) / ED DIAGNOSES  Final diagnoses:  Hypertension, unspecified type         NEW MEDICATIONS STARTED DURING THIS VISIT:  Current Discharge Medication List        Eula Listen, MD 09/05/17 2143

## 2017-09-05 NOTE — ED Triage Notes (Signed)
Pt states she was seen in the ED yesterday for hypertension - she is returning today for report of hypertension BP 204/92 - c/o headache and dizziness

## 2017-09-06 ENCOUNTER — Encounter: Payer: Self-pay | Admitting: Nurse Practitioner

## 2017-09-07 ENCOUNTER — Ambulatory Visit
Admission: RE | Admit: 2017-09-07 | Discharge: 2017-09-07 | Disposition: A | Discharge status: Home / Self Care | Payer: Medicare Other | Source: Ambulatory Visit | Attending: Oncology | Admitting: Oncology

## 2017-09-07 ENCOUNTER — Inpatient Hospital Stay: Payer: Medicare Other

## 2017-09-07 DIAGNOSIS — C679 Malignant neoplasm of bladder, unspecified: Secondary | ICD-10-CM | POA: Diagnosis not present

## 2017-09-07 DIAGNOSIS — R911 Solitary pulmonary nodule: Secondary | ICD-10-CM | POA: Diagnosis not present

## 2017-09-07 DIAGNOSIS — I700 Atherosclerosis of aorta: Secondary | ICD-10-CM | POA: Insufficient documentation

## 2017-09-07 DIAGNOSIS — Z8551 Personal history of malignant neoplasm of bladder: Secondary | ICD-10-CM | POA: Diagnosis not present

## 2017-09-07 MED ORDER — IOPAMIDOL (ISOVUE-370) INJECTION 76%
75.0000 mL | Freq: Once | INTRAVENOUS | Status: AC | PRN
  Administered 2017-09-07: 75 mL via INTRAVENOUS

## 2017-09-10 ENCOUNTER — Telehealth: Payer: Self-pay | Admitting: Urology

## 2017-09-10 NOTE — Telephone Encounter (Signed)
Where would you like me to put her on your schedule?  Thanks, Sharyn Lull

## 2017-09-10 NOTE — Telephone Encounter (Signed)
Thursday 3:45?

## 2017-09-11 ENCOUNTER — Other Ambulatory Visit: Payer: Self-pay

## 2017-09-11 ENCOUNTER — Encounter: Payer: Self-pay | Admitting: Oncology

## 2017-09-11 ENCOUNTER — Inpatient Hospital Stay: Payer: Medicare Other

## 2017-09-11 ENCOUNTER — Inpatient Hospital Stay (HOSPITAL_BASED_OUTPATIENT_CLINIC_OR_DEPARTMENT_OTHER): Payer: Medicare Other | Admitting: Oncology

## 2017-09-11 VITALS — BP 179/55 | HR 63 | Temp 99.4°F | Resp 12 | Ht 65.0 in | Wt 136.6 lb

## 2017-09-11 DIAGNOSIS — R002 Palpitations: Secondary | ICD-10-CM | POA: Diagnosis not present

## 2017-09-11 DIAGNOSIS — Z923 Personal history of irradiation: Secondary | ICD-10-CM

## 2017-09-11 DIAGNOSIS — Z79899 Other long term (current) drug therapy: Secondary | ICD-10-CM | POA: Diagnosis not present

## 2017-09-11 DIAGNOSIS — R42 Dizziness and giddiness: Secondary | ICD-10-CM | POA: Diagnosis not present

## 2017-09-11 DIAGNOSIS — Z9221 Personal history of antineoplastic chemotherapy: Secondary | ICD-10-CM

## 2017-09-11 DIAGNOSIS — Z86711 Personal history of pulmonary embolism: Secondary | ICD-10-CM | POA: Diagnosis not present

## 2017-09-11 DIAGNOSIS — C679 Malignant neoplasm of bladder, unspecified: Secondary | ICD-10-CM

## 2017-09-11 DIAGNOSIS — Z87891 Personal history of nicotine dependence: Secondary | ICD-10-CM

## 2017-09-11 DIAGNOSIS — Z955 Presence of coronary angioplasty implant and graft: Secondary | ICD-10-CM | POA: Diagnosis not present

## 2017-09-11 DIAGNOSIS — Z7901 Long term (current) use of anticoagulants: Secondary | ICD-10-CM | POA: Diagnosis not present

## 2017-09-11 DIAGNOSIS — Z95828 Presence of other vascular implants and grafts: Secondary | ICD-10-CM

## 2017-09-11 DIAGNOSIS — D751 Secondary polycythemia: Secondary | ICD-10-CM | POA: Diagnosis not present

## 2017-09-11 DIAGNOSIS — J449 Chronic obstructive pulmonary disease, unspecified: Secondary | ICD-10-CM | POA: Diagnosis not present

## 2017-09-11 DIAGNOSIS — E7800 Pure hypercholesterolemia, unspecified: Secondary | ICD-10-CM | POA: Diagnosis not present

## 2017-09-11 DIAGNOSIS — I10 Essential (primary) hypertension: Secondary | ICD-10-CM

## 2017-09-11 DIAGNOSIS — R0602 Shortness of breath: Secondary | ICD-10-CM | POA: Diagnosis not present

## 2017-09-11 MED ORDER — SODIUM CHLORIDE 0.9% FLUSH
10.0000 mL | INTRAVENOUS | Status: AC | PRN
  Administered 2017-09-11: 10 mL
  Filled 2017-09-11: qty 10

## 2017-09-11 MED ORDER — HEPARIN SOD (PORK) LOCK FLUSH 100 UNIT/ML IV SOLN
500.0000 [IU] | INTRAVENOUS | Status: AC | PRN
  Administered 2017-09-11: 500 [IU]

## 2017-09-11 NOTE — Progress Notes (Signed)
Patient here for results. She has been re started on a BP med due to high BP she has not started taking it yet.

## 2017-09-13 ENCOUNTER — Other Ambulatory Visit: Payer: Self-pay | Admitting: Radiology

## 2017-09-13 ENCOUNTER — Ambulatory Visit (INDEPENDENT_AMBULATORY_CARE_PROVIDER_SITE_OTHER): Payer: Medicare Other | Admitting: Urology

## 2017-09-13 ENCOUNTER — Encounter: Payer: Self-pay | Admitting: Urology

## 2017-09-13 ENCOUNTER — Telehealth: Payer: Self-pay | Admitting: Radiology

## 2017-09-13 VITALS — BP 207/66 | HR 77 | Ht 65.0 in | Wt 135.0 lb

## 2017-09-13 DIAGNOSIS — N289 Disorder of kidney and ureter, unspecified: Secondary | ICD-10-CM

## 2017-09-13 DIAGNOSIS — N2889 Other specified disorders of kidney and ureter: Secondary | ICD-10-CM | POA: Diagnosis not present

## 2017-09-13 DIAGNOSIS — Z8551 Personal history of malignant neoplasm of bladder: Secondary | ICD-10-CM

## 2017-09-13 LAB — URINALYSIS, COMPLETE
Bilirubin, UA: NEGATIVE
Glucose, UA: NEGATIVE
KETONES UA: NEGATIVE
Leukocytes, UA: NEGATIVE
NITRITE UA: NEGATIVE
Protein, UA: NEGATIVE
RBC, UA: NEGATIVE
SPEC GRAV UA: 1.015 (ref 1.005–1.030)
Urobilinogen, Ur: 0.2 mg/dL (ref 0.2–1.0)
pH, UA: 5 (ref 5.0–7.5)

## 2017-09-13 LAB — MICROSCOPIC EXAMINATION
RBC MICROSCOPIC, UA: NONE SEEN /HPF (ref 0–2)
WBC UA: NONE SEEN /HPF (ref 0–5)

## 2017-09-13 NOTE — H&P (View-Only) (Signed)
 09/13/2017 9:46 AM   Tina [name] 04/29/1935 [id number]  Referring provider: Scott, Charlene, MD [address] [place], [place] [address]  Chief Complaint  Patient presents with  . Follow-up    discuss surgery    HPI: 83-year-old female with personal history of muscle invasive bladder cancer, small cell variant status post chemotherapy/XRT currently in remission.  She initially presented to [facility] on 02/2017 found to have an incidental large bladder mass during admission for mesenteric bowel ischemia.  She underwent TURBT on 02/14/2017 with bilateral ureteral stent placement.  She has been subsequently treated with chemo (carbo/etoposide) and radiation and is in remission.     Most recently, she underwent surveillance CT scan by Dr. Rao, her oncologist, on 09/07/2017.  This revealed a small area within the left proximal ureter suggestive of possible filling defect, otherwise no evidence of disease.  She denies any flank pain or gross hematuria.  She is feeling very well.  She is back to dancing and her energy level is excellent.  Since her diagnosis of bladder cancer, she was diagnosed with incidental bilateral PEs in 05/2017 and has been on Eliquis.   PMH: Past Medical History:  Diagnosis Date  . Anemia   . Asthma   . Bladder cancer (HCC)   . BRCA positive   . CAD (coronary artery disease)   . COPD (chronic obstructive pulmonary disease) (HCC)   . Emphysema lung (HCC)   . GERD (gastroesophageal reflux disease)   . History of colon polyps   . Hypercholesterolemia   . Hyperglycemia   . Hypertension   . Lung nodules   . Osteoporosis   . Personal history of tobacco use, presenting hazards to health 11/25/2014  . Polycythemia vera(238.4)   . Renal cyst   . Skin cancer     Surgical History: Past Surgical History:  Procedure Laterality Date  . ANGIOPLASTY     coronary (x1)  . COLONOSCOPY WITH PROPOFOL N/A 03/02/2015   Procedure: COLONOSCOPY WITH  PROPOFOL;  Surgeon: Martin U Skulskie, MD;  Location: [facility];  Service: Endoscopy;  Laterality: N/A;  . COLONOSCOPY WITH PROPOFOL N/A 02/13/2017   Procedure: COLONOSCOPY WITH PROPOFOL;  Surgeon: Wohl, Darren, MD;  Location: [facility];  Service: Endoscopy;  Laterality: N/A;  . CORONARY ANGIOPLASTY    . CORONARY ARTERY BYPASS GRAFT    . CYSTOSCOPY W/ RETROGRADES Bilateral 02/13/2017   Procedure: CYSTOSCOPY WITH RETROGRADE PYELOGRAM;  Surgeon: Katriona Schmierer, MD;  Location: [facility];  Service: Urology;  Laterality: Bilateral;  . CYSTOSCOPY WITH BIOPSY  03/26/2017   Procedure: CYSTOSCOPY WITH BIOPSY;  Surgeon: Jachin Coury, MD;  Location: [facility];  Service: Urology;;  . CYSTOSCOPY WITH STENT PLACEMENT Bilateral 02/13/2017   Procedure: CYSTOSCOPY WITH STENT PLACEMENT;  Surgeon: Johncharles Fusselman, MD;  Location: [facility];  Service: Urology;  Laterality: Bilateral;  . CYSTOSCOPY WITH STENT PLACEMENT Bilateral 03/26/2017   Procedure: CYSTOSCOPY WITH STENT EXCHANGE;  Surgeon: Kacie Huxtable, MD;  Location: [facility];  Service: Urology;  Laterality: Bilateral;  . ESOPHAGOGASTRODUODENOSCOPY (EGD) WITH PROPOFOL N/A 02/13/2017   Procedure: ESOPHAGOGASTRODUODENOSCOPY (EGD) WITH PROPOFOL;  Surgeon: Wohl, Darren, MD;  Location: [facility];  Service: Endoscopy;  Laterality: N/A;  . PORTA CATH INSERTION N/A 02/28/2017   Procedure: PORTA CATH INSERTION;  Surgeon: Dew, Jason S, MD;  Location: [facility];  Service: Cardiovascular;  Laterality: N/A;  . SALPINGOOPHORECTOMY    . TONSILECTOMY/ADENOIDECTOMY WITH MYRINGOTOMY    . TRANSURETHRAL RESECTION OF BLADDER TUMOR N/A 02/13/2017     Procedure: TRANSURETHRAL RESECTION OF BLADDER TUMOR (TURBT);  Surgeon: Matsue Strom, MD;  Location: [facility];  Service: Urology;  Laterality: N/A;  . TUBAL LIGATION    . UPPER GI ENDOSCOPY  02/13/2017  . VISCERAL ARTERY INTERVENTION N/A 02/12/2017   Procedure: VISCERAL ARTERY INTERVENTION;  Surgeon: Dew, Jason  S, MD;  Location: [facility];  Service: Cardiovascular;  Laterality: N/A;    Home Medications:  Allergies as of 09/13/2017      Reactions   Evista [raloxifene] Other (See Comments)   Night sweats    Fluticasone-salmeterol Other (See Comments)   Cough, "chokes me"       Medication List        Accurate as of 09/13/17 11:59 PM. Always use your most recent med list.          albuterol 108 (90 Base) MCG/ACT inhaler Commonly known as:  PROVENTIL HFA;VENTOLIN HFA Inhale 2 puffs into the lungs every 6 (six) hours as needed for wheezing or shortness of breath.   amLODipine 5 MG tablet Commonly known as:  NORVASC TAKE 1 TABLET BY MOUTH  DAILY   CALCIUM CARBONATE PO Take 1 tablet by mouth every Monday, Wednesday, and Friday.   cholecalciferol 1000 units tablet Commonly known as:  VITAMIN D Take 1,000 Units by mouth daily.   ELIQUIS 5 MG Tabs tablet Generic drug:  apixaban TAKE 1 TABLET BY MOUTH TWO  TIMES DAILY   fluticasone 50 MCG/ACT nasal spray Commonly known as:  FLONASE Place 2 sprays into both nostrils daily.   ipratropium-albuterol 0.5-2.5 (3) MG/3ML Soln Commonly known as:  DUONEB Take 3 mLs by nebulization every 6 (six) hours as needed (for shortness of breath).   lidocaine-prilocaine cream Commonly known as:  EMLA APPLY TOPICALLY TO AFECTED AREA ONCE AS NEEDED PRIOR TO APPOINMENT   losartan 100 MG tablet Commonly known as:  COZAAR TAKE 1 TABLET BY MOUTH  DAILY   lovastatin 40 MG tablet Commonly known as:  MEVACOR TAKE 1 TABLET BY MOUTH  DAILY   metoprolol succinate 25 MG 24 hr tablet Commonly known as:  TOPROL-XL Take 1 tablet by mouth daily.   metoprolol tartrate 25 MG tablet Commonly known as:  LOPRESSOR Take 1 tablet (25 mg total) by mouth 2 (two) times daily.   MYRBETRIQ 25 MG Tb24 tablet Generic drug:  mirabegron ER Take 1 tablet by mouth daily.   ondansetron 8 MG tablet Commonly known as:  ZOFRAN Take 1 tablet (8 mg total) by mouth 2  (two) times daily as needed for refractory nausea / vomiting. Start on day 3 after carboplatin chemo.   potassium chloride 10 MEQ tablet Commonly known as:  K-DUR,KLOR-CON Take 1 tablet (10 mEq total) by mouth daily.   prochlorperazine 10 MG tablet Commonly known as:  COMPAZINE Take 1 tablet (10 mg total) by mouth every 6 (six) hours as needed (Nausea or vomiting).   rosuvastatin 20 MG tablet Commonly known as:  CRESTOR Take 20 mg by mouth.   SYMBICORT 160-4.5 MCG/ACT inhaler Generic drug:  budesonide-formoterol USE 2 PUFFS TWO TIMES DAILY       Allergies:  Allergies  Allergen Reactions  . Evista [Raloxifene] Other (See Comments)    Night sweats   . Fluticasone-Salmeterol Other (See Comments)    Cough, "chokes me"     Family History: Family History  Problem Relation Age of Onset  . Cirrhosis Father        died age 52  . Alcohol abuse Father   .   Asthma Mother   . Congestive Heart Failure Mother   . Breast cancer Mother        2 (1/2 sisters)  . Osteoarthritis Mother   . Colon cancer Mother   . Lupus Sister   . Alcohol abuse Sister   . Ovarian cancer Sister   . Osteoporosis Sister   . Skin cancer Sister   . Breast cancer Cousin   . Breast cancer Maternal Aunt     Social History:  reports that she quit smoking about 13 years ago. Her smoking use included cigarettes. She has a 45.00 pack-year smoking history. She has never used smokeless tobacco. She reports that she does not drink alcohol or use drugs.  ROS: UROLOGY Frequent Urination?: Yes Hard to postpone urination?: No Burning/pain with urination?: No Get up at night to urinate?: Yes Leakage of urine?: No Urine stream starts and stops?: No Trouble starting stream?: No Do you have to strain to urinate?: No Blood in urine?: No Urinary tract infection?: No Sexually transmitted disease?: No Injury to kidneys or bladder?: No Painful intercourse?: No Weak stream?: No Currently pregnant?: No Vaginal  bleeding?: No Last menstrual period?: n  Gastrointestinal Nausea?: No Vomiting?: No Indigestion/heartburn?: No Diarrhea?: No Constipation?: No  Constitutional Fever: No Night sweats?: No Weight loss?: No Fatigue?: No  Skin Skin rash/lesions?: No Itching?: No  Eyes Blurred vision?: No Double vision?: No  Ears/Nose/Throat Sore throat?: No Sinus problems?: No  Hematologic/Lymphatic Swollen glands?: No Easy bruising?: No  Cardiovascular Leg swelling?: No Chest pain?: No  Respiratory Cough?: No Shortness of breath?: No  Endocrine Excessive thirst?: No  Musculoskeletal Back pain?: No Joint pain?: No  Neurological Headaches?: Yes Dizziness?: Yes  Psychologic Depression?: No Anxiety?: No  Physical Exam: BP (!) 207/66   Pulse 77   Ht 5' 5" (1.651 m)   Wt 135 lb (61.2 kg)   BMI 22.47 kg/m   Constitutional:  Alert and oriented, No acute distress.  Well-dressed. HEENT: [place] AT, moist mucus membranes.  Trachea midline, no masses. Cardiovascular: No clubbing, cyanosis, or edema. Respiratory: Normal respiratory effort, no increased work of breathing. GI: Abdomen is soft, nontender, nondistended, no abdominal masses GU: No CVA tenderness. Skin: No rashes, bruises or suspicious lesions. Neurologic: Grossly intact, no focal deficits, moving all 4 extremities. Psychiatric: Normal mood and affect.  Laboratory Data: Lab Results  Component Value Date   WBC 10.0 09/05/2017   HGB 15.9 09/05/2017   HCT 46.0 09/05/2017   MCV 92.7 09/05/2017   PLT 152 09/05/2017    Lab Results  Component Value Date   CREATININE 0.94 09/05/2017    Lab Results  Component Value Date   HGBA1C 5.1 05/25/2017    Urinalysis    Component Value Date/Time   COLORURINE YELLOW (A) 03/20/2017 1203   APPEARANCEUR Clear 09/13/2017 1502   LABSPEC 1.010 03/20/2017 1203   PHURINE 6.0 03/20/2017 1203   GLUCOSEU Negative 09/13/2017 1502   GLUCOSEU NEGATIVE 02/08/2017 1520   HGBUR  SMALL (A) 03/20/2017 1203   BILIRUBINUR Negative 09/13/2017 1502   KETONESUR NEGATIVE 03/20/2017 1203   PROTEINUR Negative 09/13/2017 1502   PROTEINUR 100 (A) 03/20/2017 1203   UROBILINOGEN 0.2 02/08/2017 1520   NITRITE Negative 09/13/2017 1502   NITRITE NEGATIVE 03/20/2017 1203   LEUKOCYTESUR Negative 09/13/2017 1502    Lab Results  Component Value Date   LABMICR See below: 09/13/2017   WBCUA None seen 09/13/2017   RBCUA None seen 09/13/2017   LABEPIT 0-10 09/13/2017   MUCUS Present (  A) 09/13/2017   BACTERIA Few (A) 09/13/2017    Pertinent Imaging: CLINICAL DATA:  Patient with history of bladder cancer status post resection in 02/2017. Patient status post chemotherapy and radiation therapy.  EXAM: CT CHEST, ABDOMEN, AND PELVIS WITH CONTRAST  TECHNIQUE: Multidetector CT imaging of the chest, abdomen and pelvis was performed following the standard protocol during bolus administration of intravenous contrast.  CONTRAST:  75mL ISOVUE-370 IOPAMIDOL (ISOVUE-370) INJECTION 76%  COMPARISON:  CT CAP 05/25/2017.  FINDINGS: CT CHEST FINDINGS  Cardiovascular: Left anterior chest wall Port-A-Cath is present with tip terminating in the superior vena cava. Normal heart size. Coronary arterial vascular calcifications. Trace fluid superior pericardial recess.  Mediastinum/Nodes: No enlarged axillary, mediastinal or hilar lymphadenopathy.  Lungs/Pleura: Central airways are patent. Dependent atelectasis left lower lobe. Stable 3 mm left upper lobe nodule (image 48; series 4). Centrilobular and paraseptal emphysematous changes. No new or enlarging pulmonary nodules. No pleural effusion or pneumothorax.  Musculoskeletal: No aggressive or acute appearing osseous lesions. Thoracic degenerative changes.  CT ABDOMEN PELVIS FINDINGS  Hepatobiliary: No focal hepatic lesion identified. Gallbladder is unremarkable. No intrahepatic or extrahepatic biliary  ductal dilatation.  Pancreas: Unremarkable  Spleen: Unremarkable  Adrenals/Urinary Tract: Stable left adrenal gland nodular thickening. Right adrenal gland is normal. Kidneys enhance symmetrically with contrast. Stable 6 cm cyst interpolar region left kidney. On delayed images there are no filling defects identified within the renal collecting systems. No residual urothelial thickening involving the proximal right ureter. Within the proximal left ureter there is suggestion of a possible intraluminal filling defect (image 21; series 8). Urinary bladder is unremarkable.  Stomach/Bowel: No abnormal bowel wall thickening or evidence for bowel obstruction. No free fluid or free intraperitoneal air.  Vascular/Lymphatic: Normal caliber abdominal aorta. Dense calcified plaque at the level of the superior mesenteric artery with intraluminal aortic involvement. No retroperitoneal lymphadenopathy.  Reproductive: Uterus and adnexal structures are unremarkable.  Other: None.  Musculoskeletal: Lumbar spine degenerative changes. No aggressive or acute appearing osseous lesions.  IMPRESSION: 1. No evidence for localized recurrence within the pelvis. No evidence for metastatic disease within the chest, abdomen or pelvis. 2. There is suggestion of a possible filling defect involving the proximal left ureter on delayed imaging. Recommend attention to this area on follow-up as urothelial lesion not excluded. 3. Aortic atherosclerosis.   Electronically Signed   By: Drew  Davis M.D.   On: 09/07/2017 15:24  CT scan with delayed of the upper tract reviewed personally today.  It does appear that there is a small left proximal urothelial filling defect.  Assessment & Plan:    1. Ureteral mass Left proximal ureteral filling defect concerning for possible ureteral lesion versus clot versus ureteral fold Discussed differential diagnosis with patient Options including repeating the  imaging versus proceeding to the operating room for diagnostic ureteroscopy were discussed in detail She is most interested in proceeding with diagnostic ureteroscopy as should this lesion represent pathology, it could be biopsy and treated likewise. We discussed the risk of surgery including risk of bleeding, infection, damage surrounding structures, need for multiple procedures, ureteral perforation or injury, need for stent.  She understands these risks.  Given her history of PE and mesenteric ischemia, would strongly recommend remaining on Eliquis for the procedure.  She understands that this may result in worse postoperative hematuria, possible inability to visualize the lesion, amongst others, however, the risk of being off of blood thinners is much higher.  As such, she will stay on Eliquis for the procedure.    Preop UA/urine culture. - Urinalysis, Complete  2. History of bladder cancer As above, no evidence of disease other than possible ureteral lesion  Schedule left ureteroscopy, bilateral retrograde pyelogram, possible left ureteral biopsy/laser ablation of lesion, left ureteral stent  Ignatius Kloos, MD  [facility] [address], [address] [place], [place] [address] [phone]  I spent 25 min with this patient of which greater than 50% was spent in counseling and coordination of care with the patient.   Case was discussed with Dr. Rao.   

## 2017-09-13 NOTE — Progress Notes (Signed)
09/13/2017 9:46 AM   Tina [name] 1934-10-15 [id number]  Referring provider: Einar Pheasant, MD [address] [place], [place] [address]  Chief Complaint  Patient presents with  . Follow-up    discuss surgery    HPI: 83 year old female with personal history of muscle invasive bladder cancer, small cell variant status post chemotherapy/XRT currently in remission.  She initially presented to [facility] on 02/2017 found to have an incidental large bladder mass during admission for mesenteric bowel ischemia.  She underwent TURBT on 02/14/2017 with bilateral ureteral stent placement.  She has been subsequently treated with chemo (carbo/etoposide) and radiation and is in remission.     Most recently, she underwent surveillance CT scan by Dr. Janese [place], her oncologist, on 09/07/2017.  This revealed a small area within the left proximal ureter suggestive of possible filling defect, otherwise no evidence of disease.  She denies any flank pain or gross hematuria.  She is feeling very well.  She is back to dancing and her energy level is excellent.  Since her diagnosis of bladder cancer, she was diagnosed with incidental bilateral PEs in 05/2017 and has been on Eliquis.   PMH: Past Medical History:  Diagnosis Date  . Anemia   . Asthma   . Bladder cancer ([place])   . BRCA positive   . CAD (coronary artery disease)   . COPD (chronic obstructive pulmonary disease) ([place])   . Emphysema lung ([place])   . GERD (gastroesophageal reflux disease)   . History of colon polyps   . Hypercholesterolemia   . Hyperglycemia   . Hypertension   . Lung nodules   . Osteoporosis   . Personal history of tobacco use, presenting hazards to health 11/25/2014  . Polycythemia vera(238.4)   . Renal cyst   . Skin cancer     Surgical History: Past Surgical History:  Procedure Laterality Date  . ANGIOPLASTY     coronary (x1)  . COLONOSCOPY WITH PROPOFOL N/A 03/02/2015   Procedure: COLONOSCOPY WITH  PROPOFOL;  Surgeon: Lollie Sails, MD;  Location: [facility] ENDOSCOPY;  Service: Endoscopy;  Laterality: N/A;  . COLONOSCOPY WITH PROPOFOL N/A 02/13/2017   Procedure: COLONOSCOPY WITH PROPOFOL;  Surgeon: [name], MD;  Location: [facility] ENDOSCOPY;  Service: Endoscopy;  Laterality: N/A;  . CORONARY ANGIOPLASTY    . CORONARY ARTERY BYPASS GRAFT    . CYSTOSCOPY W/ RETROGRADES Bilateral 02/13/2017   Procedure: CYSTOSCOPY WITH RETROGRADE PYELOGRAM;  Surgeon: Hollice Espy, MD;  Location: [facility] ORS;  Service: Urology;  Laterality: Bilateral;  . CYSTOSCOPY WITH BIOPSY  03/26/2017   Procedure: CYSTOSCOPY WITH BIOPSY;  Surgeon: Hollice Espy, MD;  Location: [facility] ORS;  Service: Urology;;  . Consuela Mimes WITH STENT PLACEMENT Bilateral 02/13/2017   Procedure: CYSTOSCOPY WITH STENT PLACEMENT;  Surgeon: Hollice Espy, MD;  Location: [facility] ORS;  Service: Urology;  Laterality: Bilateral;  . CYSTOSCOPY WITH STENT PLACEMENT Bilateral 03/26/2017   Procedure: CYSTOSCOPY WITH STENT EXCHANGE;  Surgeon: Hollice Espy, MD;  Location: [facility] ORS;  Service: Urology;  Laterality: Bilateral;  . ESOPHAGOGASTRODUODENOSCOPY (EGD) WITH PROPOFOL N/A 02/13/2017   Procedure: ESOPHAGOGASTRODUODENOSCOPY (EGD) WITH PROPOFOL;  Surgeon: [name], MD;  Location: [facility] END[facility]COPY;  Service: Endoscopy;  Laterality: N/A;  . PORTA CATH INSERTION N/A 02/28/2017   Procedure: PORTA CATH INSERTION;  Surgeon: Algernon Huxley, MD;  Location: [place] CV LAB;  Service: Cardiovascular;  Laterality: N/A;  . SALPINGOOPHORECTOMY    . TONSILECTOMY/ADENOIDECTOMY WITH MYRINGOTOMY    . TRANSURETHRAL RESECTION OF BLADDER TUMOR N/A 02/13/2017  Procedure: TRANSURETHRAL RESECTION OF BLADDER TUMOR (TURBT);  Surgeon: Hollice Espy, MD;  Location: [facility] ORS;  Service: Urology;  Laterality: N/A;  . TUBAL LIGATION    . UPPER GI ENDOSCOPY  02/13/2017  . VISCERAL ARTERY INTERVENTION N/A 02/12/2017   Procedure: VISCERAL ARTERY INTERVENTION;  Surgeon: Algernon Huxley, MD;  Location: [place] CV LAB;  Service: Cardiovascular;  Laterality: N/A;    Home Medications:  Allergies as of 09/13/2017      Reactions   Evista [raloxifene] Other (See Comments)   Night sweats    Fluticasone-salmeterol Other (See Comments)   Cough, "chokes me"       Medication List        Accurate as of 09/13/17 11:59 PM. Always use your most recent med list.          albuterol 108 (90 Base) MCG/ACT inhaler Commonly known as:  PROVENTIL HFA;VENTOLIN HFA Inhale 2 puffs into the lungs every 6 (six) hours as needed for wheezing or shortness of breath.   amLODipine 5 MG tablet Commonly known as:  NORVASC TAKE 1 TABLET BY MOUTH  DAILY   CALCIUM CARBONATE PO Take 1 tablet by mouth every Monday, Wednesday, and Friday.   cholecalciferol 1000 units tablet Commonly known as:  VITAMIN D Take 1,000 Units by mouth daily.   ELIQUIS 5 MG Tabs tablet Generic drug:  apixaban TAKE 1 TABLET BY MOUTH TWO  TIMES DAILY   fluticasone 50 MCG/ACT nasal spray Commonly known as:  FLONASE Place 2 sprays into both nostrils daily.   ipratropium-albuterol 0.5-2.5 (3) MG/3ML Soln Commonly known as:  DUONEB Take 3 mLs by nebulization every 6 (six) hours as needed (for shortness of breath).   lidocaine-prilocaine cream Commonly known as:  EMLA APPLY TOPICALLY TO AFECTED AREA ONCE AS NEEDED PRIOR TO APPOINMENT   losartan 100 MG tablet Commonly known as:  COZAAR TAKE 1 TABLET BY MOUTH  DAILY   lovastatin 40 MG tablet Commonly known as:  MEVACOR TAKE 1 TABLET BY MOUTH  DAILY   metoprolol succinate 25 MG 24 hr tablet Commonly known as:  TOPROL-XL Take 1 tablet by mouth daily.   metoprolol tartrate 25 MG tablet Commonly known as:  LOPRESSOR Take 1 tablet (25 mg total) by mouth 2 (two) times daily.   MYRBETRIQ 25 MG Tb24 tablet Generic drug:  mirabegron ER Take 1 tablet by mouth daily.   ondansetron 8 MG tablet Commonly known as:  ZOFRAN Take 1 tablet (8 mg total) by mouth 2  (two) times daily as needed for refractory nausea / vomiting. Start on day 3 after carboplatin chemo.   potassium chloride 10 MEQ tablet Commonly known as:  K-DUR,KLOR-CON Take 1 tablet (10 mEq total) by mouth daily.   prochlorperazine 10 MG tablet Commonly known as:  COMPAZINE Take 1 tablet (10 mg total) by mouth every 6 (six) hours as needed (Nausea or vomiting).   rosuvastatin 20 MG tablet Commonly known as:  CRESTOR Take 20 mg by mouth.   SYMBICORT 160-4.5 MCG/ACT inhaler Generic drug:  budesonide-formoterol USE 2 PUFFS TWO TIMES DAILY       Allergies:  Allergies  Allergen Reactions  . Evista [Raloxifene] Other (See Comments)    Night sweats   . Fluticasone-Salmeterol Other (See Comments)    Cough, "chokes me"     Family History: Family History  Problem Relation Age of Onset  . Cirrhosis Father        died age 96  . Alcohol abuse Father   .  Asthma Mother   . Congestive Heart Failure Mother   . Breast cancer Mother        2 (1/2 sisters)  . Osteoarthritis Mother   . Colon cancer Mother   . Lupus Sister   . Alcohol abuse Sister   . Ovarian cancer Sister   . Osteoporosis Sister   . Skin cancer Sister   . Breast cancer Cousin   . Breast cancer Maternal Aunt     Social History:  reports that she quit smoking about 13 years ago. Her smoking use included cigarettes. She has a 45.00 pack-year smoking history. She has never used smokeless tobacco. She reports that she does not drink alcohol or use drugs.  ROS: UROLOGY Frequent Urination?: Yes Hard to postpone urination?: No Burning/pain with urination?: No Get up at night to urinate?: Yes Leakage of urine?: No Urine stream starts and stops?: No Trouble starting stream?: No Do you have to strain to urinate?: No Blood in urine?: No Urinary tract infection?: No Sexually transmitted disease?: No Injury to kidneys or bladder?: No Painful intercourse?: No Weak stream?: No Currently pregnant?: No Vaginal  bleeding?: No Last menstrual period?: n  Gastrointestinal Nausea?: No Vomiting?: No Indigestion/heartburn?: No Diarrhea?: No Constipation?: No  Constitutional Fever: No Night sweats?: No Weight loss?: No Fatigue?: No  Skin Skin rash/lesions?: No Itching?: No  Eyes Blurred vision?: No Double vision?: No  Ears/Nose/Throat Sore throat?: No Sinus problems?: No  Hematologic/Lymphatic Swollen glands?: No Easy bruising?: No  Cardiovascular Leg swelling?: No Chest pain?: No  Respiratory Cough?: No Shortness of breath?: No  Endocrine Excessive thirst?: No  Musculoskeletal Back pain?: No Joint pain?: No  Neurological Headaches?: Yes Dizziness?: Yes  Psychologic Depression?: No Anxiety?: No  Physical Exam: BP (!) 207/66   Pulse 77   Ht _0  (1.651 m)   Wt 135 lb (61.2 kg)   BMI 22.47 kg/m   Constitutional:  Alert and oriented, No acute distress.  Well-dressed. HEENT: [place] AT, moist mucus membranes.  Trachea midline, no masses. Cardiovascular: No clubbing, cyanosis, or edema. Respiratory: Normal respiratory effort, no increased work of breathing. GI: Abdomen is soft, nontender, nondistended, no abdominal masses GU: No CVA tenderness. Skin: No rashes, bruises or suspicious lesions. Neurologic: Grossly intact, no focal deficits, moving all 4 extremities. Psychiatric: Normal mood and affect.  Laboratory Data: Lab Results  Component Value Date   WBC 10.0 09/05/2017   HGB 15.9 09/05/2017   HCT 46.0 09/05/2017   MCV 92.7 09/05/2017   PLT 152 09/05/2017    Lab Results  Component Value Date   CREATININE 0.94 09/05/2017    Lab Results  Component Value Date   HGBA1C 5.1 05/25/2017    Urinalysis    Component Value Date/Time   COLORURINE YELLOW (A) 03/20/2017 1203   APPEARANCEUR Clear 09/13/2017 1502   LABSPEC 1.010 03/20/2017 1203   PHURINE 6.0 03/20/2017 1203   GLUCOSEU Negative 09/13/2017 1502   GLUCOSEU NEGATIVE 02/08/2017 1520   HGBUR  SMALL (A) 03/20/2017 1203   BILIRUBINUR Negative 09/13/2017 1502   KETONESUR NEGATIVE 03/20/2017 1203   PROTEINUR Negative 09/13/2017 1502   PROTEINUR 100 (A) 03/20/2017 1203   UROBILINOGEN 0.2 02/08/2017 1520   NITRITE Negative 09/13/2017 1502   NITRITE NEGATIVE 03/20/2017 1203   LEUKOCYTESUR Negative 09/13/2017 1502    Lab Results  Component Value Date   LABMICR See below: 09/13/2017   WBCUA None seen 09/13/2017   RBCUA None seen 09/13/2017   LABEPIT 0-10 09/13/2017   MUCUS Present (  A) 09/13/2017   BACTERIA Few (A) 09/13/2017    Pertinent Imaging: CLINICAL DATA:  Patient with history of bladder cancer status post resection in 02/2017. Patient status post chemotherapy and radiation therapy.  EXAM: CT CHEST, ABDOMEN, AND PELVIS WITH CONTRAST  TECHNIQUE: Multidetector CT imaging of the chest, abdomen and pelvis was performed following the standard protocol during bolus administration of intravenous contrast.  CONTRAST:  22m ISOVUE-370 IOPAMIDOL (ISOVUE-370) INJECTION 76%  COMPARISON:  CT CAP 05/25/2017.  FINDINGS: CT CHEST FINDINGS  Cardiovascular: Left anterior chest wall Port-A-Cath is present with tip terminating in the superior vena cava. Normal heart size. Coronary arterial vascular calcifications. Trace fluid superior pericardial recess.  Mediastinum/Nodes: No enlarged axillary, mediastinal or hilar lymphadenopathy.  Lungs/Pleura: Central airways are patent. Dependent atelectasis left lower lobe. Stable 3 mm left upper lobe nodule (image 48; series 4). Centrilobular and paraseptal emphysematous changes. No new or enlarging pulmonary nodules. No pleural effusion or pneumothorax.  Musculoskeletal: No aggressive or acute appearing osseous lesions. Thoracic degenerative changes.  CT ABDOMEN PELVIS FINDINGS  Hepatobiliary: No focal hepatic lesion identified. Gallbladder is unremarkable. No intrahepatic or extrahepatic biliary  ductal dilatation.  Pancreas: Unremarkable  Spleen: Unremarkable  Adrenals/Urinary Tract: Stable left adrenal gland nodular thickening. Right adrenal gland is normal. Kidneys enhance symmetrically with contrast. Stable 6 cm cyst interpolar region left kidney. On delayed images there are no filling defects identified within the renal collecting systems. No residual urothelial thickening involving the proximal right ureter. Within the proximal left ureter there is suggestion of a possible intraluminal filling defect (image 21; series 8). Urinary bladder is unremarkable.  Stomach/Bowel: No abnormal bowel wall thickening or evidence for bowel obstruction. No free fluid or free intraperitoneal air.  Vascular/Lymphatic: Normal caliber abdominal aorta. Dense calcified plaque at the level of the superior mesenteric artery with intraluminal aortic involvement. No retroperitoneal lymphadenopathy.  Reproductive: Uterus and adnexal structures are unremarkable.  Other: None.  Musculoskeletal: Lumbar spine degenerative changes. No aggressive or acute appearing osseous lesions.  IMPRESSION: 1. No evidence for localized recurrence within the pelvis. No evidence for metastatic disease within the chest, abdomen or pelvis. 2. There is suggestion of a possible filling defect involving the proximal left ureter on delayed imaging. Recommend attention to this area on follow-up as urothelial lesion not excluded. 3. Aortic atherosclerosis.   Electronically Signed   By: DLovey NewcomerM.D.   On: 09/07/2017 15:24  CT scan with delayed of the upper tract reviewed personally today.  It does appear that there is a small left proximal urothelial filling defect.  Assessment & Plan:    1. Ureteral mass Left proximal ureteral filling defect concerning for possible ureteral lesion versus clot versus ureteral fold Discussed differential diagnosis with patient Options including repeating the  imaging versus proceeding to the operating room for diagnostic ureteroscopy were discussed in detail She is most interested in proceeding with diagnostic ureteroscopy as should this lesion represent pathology, it could be biopsy and treated likewise. We discussed the risk of surgery including risk of bleeding, infection, damage surrounding structures, need for multiple procedures, ureteral perforation or injury, need for stent.  She understands these risks.  Given her history of PE and mesenteric ischemia, would strongly recommend remaining on Eliquis for the procedure.  She understands that this may result in worse postoperative hematuria, possible inability to visualize the lesion, amongst others, however, the risk of being off of blood thinners is much higher.  As such, she will stay on Eliquis for the procedure.  Preop UA/urine culture. - Urinalysis, Complete  2. History of bladder cancer As above, no evidence of disease other than possible ureteral lesion  Schedule left ureteroscopy, bilateral retrograde pyelogram, possible left ureteral biopsy/laser ablation of lesion, left ureteral stent  Hollice Espy, MD  [place] [address], [place], [place] [address] [phone]  I spent 25 min with this patient of which greater than 50% was spent in counseling and coordination of care with the patient.   Case was discussed with Dr. Janese [place].

## 2017-09-13 NOTE — Telephone Encounter (Signed)
No clearance needed per Dr Erlene Quan. Pt should continue Eliquis prior to surgery. Pt aware & voices understanding.

## 2017-09-13 NOTE — Progress Notes (Signed)
Hematology/Oncology Consult note [facility]  [phone] [phone]  Patient Care Team: Einar Pheasant, MD as PCP - General (Internal Medicine) Clent Jacks, RN as Registered Nurse   Name of the patient: Tina [name]  [id number]  10-10-1934   Date of visit: 09/13/17  Diagnosis- 1. 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 She has had jak 2 testing as well as EPO levels in the past which was unremarkable. She gets a phlebotomy when her hematocrit is more than 50. She was last seen by me in June 2018. She also has a history of lung nodules and was getting low-dose lung cancer screening scans on 07/04/2014 when her insurance stopped paying.   She  presented to the hospital in October 2018 with symptoms of nausea and diarrhea weight loss and fatigue. She was noted to have irregular wall thickening in the ascending colon suspicious for colon cancer as well as 4.2 x 3.2 cm posterior bladder wall mass that was incidentally noted. She was also seen by vascular surgery and underwent abdominal angiogram with stent placement at the [facility]. She is on aspirin and Plavix for the same. Patient underwent colonoscopy for concerns of ischemic colitis which showed irregular thickening of the descending colon consistent with ischemic colitis. EGD showed a small hiatal hernia. Losing gastric ulcers were noted which was treated with argon photocoagulation.  With regards to the posterior bladder wall mass she was seen by Dr. Erlene Quan and underwent TURBT with bilateral ureteral stent placement on 02/13/2017. Pathology showed small cell carcinoma with muscularis propria invasion. 4 cycles of carbo/etoposide completed on 12/261/8. She completed RT following that  as she was not deemed to be a surgical candidate. She remains in remission  Incidentally found to have PE on scans done in jan 2019   Interval history- she had symptoms of dizziness a week ago. BP in 200's. Sent to ER and then discharged. Again had similar episode after 2 days and went to ER again. Seen by cardiology today and triamterene- HCTZ was restarted. She otherwie feels well. She is currently on eliquis for her PE  ECOG PS- 1 Pain scale- 0   Review of systems- Review of Systems  Constitutional: Negative for chills, fever, malaise/fatigue and weight loss.  HENT: Negative for congestion, ear discharge and nosebleeds.   Eyes: Negative for blurred vision.  Respiratory: Negative for cough, hemoptysis, sputum production, shortness of breath and wheezing.   Cardiovascular: Negative for chest pain, palpitations, orthopnea and claudication.  Gastrointestinal: Negative for abdominal pain, blood in stool, constipation, diarrhea, heartburn, melena, nausea and vomiting.  Genitourinary: Negative for dysuria, flank pain, frequency, hematuria and urgency.  Musculoskeletal: Negative for back pain, joint pain and myalgias.  Skin: Negative for rash.  Neurological: Negative for dizziness, tingling, focal weakness, seizures, weakness and headaches.  Endo/Heme/Allergies: Does not bruise/bleed easily.  Psychiatric/Behavioral: Negative for depression and suicidal ideas. The patient does not have insomnia.       Allergies  Allergen Reactions  . Evista [Raloxifene] Other (See Comments)    Night sweats   . Fluticasone-Salmeterol Other (See Comments)    Cough, "chokes me"      Past Medical History:  Diagnosis Date  . Anemia   . Asthma   . Bladder cancer ([place])   . BRCA  positive   . CAD (coronary artery disease)   . COPD (chronic obstructive pulmonary disease) ([place])   . Emphysema lung ([place])   . GERD (gastroesophageal reflux disease)   . History of colon polyps   . Hypercholesterolemia   .  Hyperglycemia   . Hypertension   . Lung nodules   . Osteoporosis   . Personal history of tobacco use, presenting hazards to health 11/25/2014  . Polycythemia vera(238.4)   . Renal cyst   . Skin cancer      Past Surgical History:  Procedure Laterality Date  . ANGIOPLASTY     coronary (x1)  . COLONOSCOPY WITH PROPOFOL N/A 03/02/2015   Procedure: COLONOSCOPY WITH PROPOFOL;  Surgeon: Lollie Sails, MD;  Location: [facility] ENDOSCOPY;  Service: Endoscopy;  Laterality: N/A;  . COLONOSCOPY WITH PROPOFOL N/A 02/13/2017   Procedure: COLONOSCOPY WITH PROPOFOL;  Surgeon: [name], MD;  Location: [facility] ENDOSCOPY;  Service: Endoscopy;  Laterality: N/A;  . CORONARY ANGIOPLASTY    . CORONARY ARTERY BYPASS GRAFT    . CYSTOSCOPY W/ RETROGRADES Bilateral 02/13/2017   Procedure: CYSTOSCOPY WITH RETROGRADE PYELOGRAM;  Surgeon: Hollice Espy, MD;  Location: [facility] ORS;  Service: Urology;  Laterality: Bilateral;  . CYSTOSCOPY WITH BIOPSY  03/26/2017   Procedure: CYSTOSCOPY WITH BIOPSY;  Surgeon: Hollice Espy, MD;  Location: [facility] ORS;  Service: Urology;;  . Consuela Mimes WITH STENT PLACEMENT Bilateral 02/13/2017   Procedure: CYSTOSCOPY WITH STENT PLACEMENT;  Surgeon: Hollice Espy, MD;  Location: [facility] ORS;  Service: Urology;  Laterality: Bilateral;  . CYSTOSCOPY WITH STENT PLACEMENT Bilateral 03/26/2017   Procedure: CYSTOSCOPY WITH STENT EXCHANGE;  Surgeon: Hollice Espy, MD;  Location: [facility] ORS;  Service: Urology;  Laterality: Bilateral;  . ESOPHAGOGASTRODUODENOSCOPY (EGD) WITH PROPOFOL N/A 02/13/2017   Procedure: ESOPHAGOGASTRODUODENOSCOPY (EGD) WITH PROPOFOL;  Surgeon: [name], MD;  Location: [facility] ENDOSCOPY;  Service: Endoscopy;  Laterality: N/A;  . PORTA CATH INSERTION N/A 02/28/2017   Procedure: PORTA CATH INSERTION;  Surgeon: Algernon Huxley, MD;  Location: [place] CV LAB;  Service: Cardiovascular;  Laterality: N/A;  . SALPINGOOPHORECTOMY    . TONSILECTOMY/ADENOIDECTOMY WITH MYRINGOTOMY      . TRANSURETHRAL RESECTION OF BLADDER TUMOR N/A 02/13/2017   Procedure: TRANSURETHRAL RESECTION OF BLADDER TUMOR (TURBT);  Surgeon: Hollice Espy, MD;  Location: [facility] ORS;  Service: Urology;  Laterality: N/A;  . TUBAL LIGATION    . UPPER GI ENDOSCOPY  02/13/2017  . VISCERAL ARTERY INTERVENTION N/A 02/12/2017   Procedure: VISCERAL ARTERY INTERVENTION;  Surgeon: Algernon Huxley, MD;  Location: [place] CV LAB;  Service: Cardiovascular;  Laterality: N/A;    Social History   Socioeconomic History  . Marital status: Widowed    Spouse name: Not on file  . Number of children: 2  . Years of education: Not on file  . Highest education level: Not on file  Occupational History  . Not on file  Social Needs  . Financial resource strain: Not on file  . Food insecurity:    Worry: Not on file    Inability: Not on file  . Transportation needs:    Medical: Not on file    Non-medical: Not on file  Tobacco Use  . Smoking status: Former Smoker    Packs/day: 1.00    Years: 45.00    Pack years: 45.00    Types: Cigarettes    Last attempt to quit: 11/11/2003    Years since quitting: 13.8  . Smokeless tobacco: Never Used  Substance and Sexual Activity  .  Alcohol use: No    Alcohol/week: 0.0 oz    Comment: occasional  . Drug use: No  . Sexual activity: Never  Lifestyle  . Physical activity:    Days per week: Not on file    Minutes per session: Not on file  . Stress: Not on file  Relationships  . Social connections:    Talks on phone: Not on file    Gets together: Not on file    Attends religious service: Not on file    Active member of club or organization: Not on file    Attends meetings of clubs or organizations: Not on file    Relationship status: Not on file  . Intimate partner violence:    Fear of current or ex partner: Not on file    Emotionally abused: Not on file    Physically abused: Not on file    Forced sexual activity: Not on file  Other Topics Concern  . Not on file   Social History Narrative  . Not on file    Family History  Problem Relation Age of Onset  . Cirrhosis Father        died age 67  . Alcohol abuse Father   . Asthma Mother   . Congestive Heart Failure Mother   . Breast cancer Mother        2 (1/2 sisters)  . Osteoarthritis Mother   . Colon cancer Mother   . Lupus Sister   . Alcohol abuse Sister   . Ovarian cancer Sister   . Osteoporosis Sister   . Skin cancer Sister   . Breast cancer Cousin   . Breast cancer Maternal Aunt      Current Outpatient Medications:  .  albuterol (PROVENTIL HFA;VENTOLIN HFA) 108 (90 Base) MCG/ACT inhaler, Inhale 2 puffs into the lungs every 6 (six) hours as needed for wheezing or shortness of breath., Disp: 1 Inhaler, Rfl: 2 .  amLODipine (NORVASC) 5 MG tablet, TAKE 1 TABLET BY MOUTH  DAILY, Disp: 90 tablet, Rfl: 1 .  CALCIUM CARBONATE PO, Take 1 tablet by mouth every Monday, Wednesday, and Friday., Disp: , Rfl:  .  cholecalciferol (VITAMIN D) 1000 units tablet, Take 1,000 Units by mouth daily., Disp: , Rfl:  .  ELIQUIS 5 MG TABS tablet, TAKE 1 TABLET BY MOUTH TWO  TIMES DAILY, Disp: 180 tablet, Rfl: 0 .  fluticasone (FLONASE) 50 MCG/ACT nasal spray, Place 2 sprays into both nostrils daily., Disp: 16 g, Rfl: 5 .  ipratropium-albuterol (DUONEB) 0.5-2.5 (3) MG/3ML SOLN, Take 3 mLs by nebulization every 6 (six) hours as needed (for shortness of breath)., Disp: 36 mL, Rfl: 0 .  lidocaine-prilocaine (EMLA) cream, APPLY TOPICALLY TO AFECTED AREA ONCE AS NEEDED PRIOR TO APPOINMENT, Disp: , Rfl: 3 .  losartan (COZAAR) 100 MG tablet, TAKE 1 TABLET BY MOUTH  DAILY (Patient taking differently: TAKE 146m BY MOUTH  DAILY), Disp: 90 tablet, Rfl: 1 .  lovastatin (MEVACOR) 40 MG tablet, TAKE 1 TABLET BY MOUTH  DAILY, Disp: 90 tablet, Rfl: 0 .  metoprolol succinate (TOPROL-XL) 25 MG 24 hr tablet, Take 1 tablet by mouth daily., Disp: , Rfl:  .  metoprolol tartrate (LOPRESSOR) 25 MG tablet, Take 1 tablet (25 mg total) by  mouth 2 (two) times daily., Disp: 60 tablet, Rfl: 0 .  MYRBETRIQ 25 MG TB24 tablet, Take 1 tablet by mouth daily., Disp: , Rfl:  .  ondansetron (ZOFRAN) 8 MG tablet, Take 1 tablet (8 mg total) by  mouth 2 (two) times daily as needed for refractory nausea / vomiting. Start on day 3 after carboplatin chemo., Disp: 30 tablet, Rfl: 1 .  potassium chloride (K-DUR,KLOR-CON) 10 MEQ tablet, Take 1 tablet (10 mEq total) by mouth daily., Disp: 90 tablet, Rfl: 1 .  prochlorperazine (COMPAZINE) 10 MG tablet, Take 1 tablet (10 mg total) by mouth every 6 (six) hours as needed (Nausea or vomiting)., Disp: 30 tablet, Rfl: 1 .  rosuvastatin (CRESTOR) 20 MG tablet, Take 20 mg by mouth., Disp: , Rfl:  .  SYMBICORT 160-4.5 MCG/ACT inhaler, USE 2 PUFFS TWO TIMES DAILY, Disp: 30.6 g, Rfl: 1 No current facility-administered medications for this visit.   Facility-Administered Medications Ordered in Other Visits:  .  cloNIDine (CATAPRES) tablet 0.1 mg, 0.1 mg, Oral, Once, Christean Grief, RN  Physical exam:  Vitals:   09/11/17 1409 09/11/17 1416  BP:  (!) 179/55  Pulse:  63  Resp: 12   Temp:  99.4 F (37.4 C)  TempSrc:  Tympanic  Weight: 136 lb 9.6 oz (62 kg)   Height: _0  (1.651 m)    Physical Exam  Constitutional: She is oriented to person, place, and time.  Elderly thin woman in no acute distress  HENT:  Head: Normocephalic and atraumatic.  Eyes: Pupils are equal, round, and reactive to light. EOM are normal.  Neck: Normal range of motion.  Cardiovascular: Normal rate, regular rhythm and normal heart sounds.  Pulmonary/Chest: Effort normal and breath sounds normal.  Abdominal: Soft. Bowel sounds are normal.  Musculoskeletal: She exhibits edema (trace b/l).  Neurological: She is alert and oriented to person, place, and time.  Skin: Skin is warm and dry.     CMP Latest Ref Rng & Units 09/05/2017  Glucose 65 - 99 mg/dL 105(H)  BUN 6 - 20 mg/dL 21(H)  Creatinine 0.44 - 1.00 mg/dL 0.94  Sodium  135 - 145 mmol/L 140  Potassium 3.5 - 5.1 mmol/L 3.8  Chloride 101 - 111 mmol/L 108  CO2 22 - 32 mmol/L 24  Calcium 8.9 - 10.3 mg/dL 9.5  Total Protein 6.5 - 8.1 g/dL 7.2  Total Bilirubin 0.3 - 1.2 mg/dL 0.7  Alkaline Phos 38 - 126 U/L 66  AST 15 - 41 U/L 21  ALT 14 - 54 U/L 14   CBC Latest Ref Rng & Units 09/05/2017  WBC 3.6 - 11.0 K/uL 10.0  Hemoglobin 12.0 - 16.0 g/dL 15.9  Hematocrit 35.0 - 47.0 % 46.0  Platelets 150 - 440 K/uL 152    No images are attached to the encounter.  Ct Chest W Contrast  Result Date: 09/07/2017 CLINICAL DATA:  Patient with history of bladder cancer status post resection in 02/2017. Patient status post chemotherapy and radiation therapy. EXAM: CT CHEST, ABDOMEN, AND PELVIS WITH CONTRAST TECHNIQUE: Multidetector CT imaging of the chest, abdomen and pelvis was performed following the standard protocol during bolus administration of intravenous contrast. CONTRAST:  60m ISOVUE-370 IOPAMIDOL (ISOVUE-370) INJECTION 76% COMPARISON:  CT CAP 05/25/2017. FINDINGS: CT CHEST FINDINGS Cardiovascular: Left anterior chest wall Port-A-Cath is present with tip terminating in the superior vena cava. Normal heart size. Coronary arterial vascular calcifications. Trace fluid superior pericardial recess. Mediastinum/Nodes: No enlarged axillary, mediastinal or hilar lymphadenopathy. Lungs/Pleura: Central airways are patent. Dependent atelectasis left lower lobe. Stable 3 mm left upper lobe nodule (image 48; series 4). Centrilobular and paraseptal emphysematous changes. No new or enlarging pulmonary nodules. No pleural effusion or pneumothorax. Musculoskeletal: No aggressive or acute appearing osseous lesions.  Thoracic degenerative changes. CT ABDOMEN PELVIS FINDINGS Hepatobiliary: No focal hepatic lesion identified. Gallbladder is unremarkable. No intrahepatic or extrahepatic biliary ductal dilatation. Pancreas: Unremarkable Spleen: Unremarkable Adrenals/Urinary Tract: Stable left adrenal  gland nodular thickening. Right adrenal gland is normal. Kidneys enhance symmetrically with contrast. Stable 6 cm cyst interpolar region left kidney. On delayed images there are no filling defects identified within the renal collecting systems. No residual urothelial thickening involving the proximal right ureter. Within the proximal left ureter there is suggestion of a possible intraluminal filling defect (image 21; series 8). Urinary bladder is unremarkable. Stomach/Bowel: No abnormal bowel wall thickening or evidence for bowel obstruction. No free fluid or free intraperitoneal air. Vascular/Lymphatic: Normal caliber abdominal aorta. Dense calcified plaque at the level of the superior mesenteric artery with intraluminal aortic involvement. No retroperitoneal lymphadenopathy. Reproductive: Uterus and adnexal structures are unremarkable. Other: None. Musculoskeletal: Lumbar spine degenerative changes. No aggressive or acute appearing osseous lesions. IMPRESSION: 1. No evidence for localized recurrence within the pelvis. No evidence for metastatic disease within the chest, abdomen or pelvis. 2. There is suggestion of a possible filling defect involving the proximal left ureter on delayed imaging. Recommend attention to this area on follow-up as urothelial lesion not excluded. 3. Aortic atherosclerosis. Electronically Signed   By: Lovey Newcomer M.D.   On: 09/07/2017 15:24   Ct Abdomen Pelvis W Contrast  Result Date: 09/07/2017 CLINICAL DATA:  Patient with history of bladder cancer status post resection in 02/2017. Patient status post chemotherapy and radiation therapy. EXAM: CT CHEST, ABDOMEN, AND PELVIS WITH CONTRAST TECHNIQUE: Multidetector CT imaging of the chest, abdomen and pelvis was performed following the standard protocol during bolus administration of intravenous contrast. CONTRAST:  14m ISOVUE-370 IOPAMIDOL (ISOVUE-370) INJECTION 76% COMPARISON:  CT CAP 05/25/2017. FINDINGS: CT CHEST FINDINGS  Cardiovascular: Left anterior chest wall Port-A-Cath is present with tip terminating in the superior vena cava. Normal heart size. Coronary arterial vascular calcifications. Trace fluid superior pericardial recess. Mediastinum/Nodes: No enlarged axillary, mediastinal or hilar lymphadenopathy. Lungs/Pleura: Central airways are patent. Dependent atelectasis left lower lobe. Stable 3 mm left upper lobe nodule (image 48; series 4). Centrilobular and paraseptal emphysematous changes. No new or enlarging pulmonary nodules. No pleural effusion or pneumothorax. Musculoskeletal: No aggressive or acute appearing osseous lesions. Thoracic degenerative changes. CT ABDOMEN PELVIS FINDINGS Hepatobiliary: No focal hepatic lesion identified. Gallbladder is unremarkable. No intrahepatic or extrahepatic biliary ductal dilatation. Pancreas: Unremarkable Spleen: Unremarkable Adrenals/Urinary Tract: Stable left adrenal gland nodular thickening. Right adrenal gland is normal. Kidneys enhance symmetrically with contrast. Stable 6 cm cyst interpolar region left kidney. On delayed images there are no filling defects identified within the renal collecting systems. No residual urothelial thickening involving the proximal right ureter. Within the proximal left ureter there is suggestion of a possible intraluminal filling defect (image 21; series 8). Urinary bladder is unremarkable. Stomach/Bowel: No abnormal bowel wall thickening or evidence for bowel obstruction. No free fluid or free intraperitoneal air. Vascular/Lymphatic: Normal caliber abdominal aorta. Dense calcified plaque at the level of the superior mesenteric artery with intraluminal aortic involvement. No retroperitoneal lymphadenopathy. Reproductive: Uterus and adnexal structures are unremarkable. Other: None. Musculoskeletal: Lumbar spine degenerative changes. No aggressive or acute appearing osseous lesions. IMPRESSION: 1. No evidence for localized recurrence within the pelvis.  No evidence for metastatic disease within the chest, abdomen or pelvis. 2. There is suggestion of a possible filling defect involving the proximal left ureter on delayed imaging. Recommend attention to this area on follow-up as urothelial lesion not excluded.  3. Aortic atherosclerosis. Electronically Signed   By: Lovey Newcomer M.D.   On: 09/07/2017 15:24     Assessment and plan- Patient is a 83 y.o. 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 There is no evidence of recurrence seen on scans. I did touch base with Dr. Erlene Quan regarding the possible filling defect seen in left ureter and she has a follow up with Dr. Erlene Quan.  I will see her back in 3 months with CT chest abdomen and pelvis with contrast. Cbc, cmp prior. I will plan to stop her eliquis when I see her next time.   Uncontrolled hypertension: she has been started on triamterene- HCTZ by cardiology but will need BP checked next week and we will touch base with Dr. Nicki Reaper about this   Visit Diagnosis 1. Small cell carcinoma of bladder ([place])      Dr. Randa Evens, MD, MPH [facility] at [facility] [phone] 09/13/2017 11:40 AM

## 2017-09-14 ENCOUNTER — Telehealth: Payer: Self-pay | Admitting: Radiology

## 2017-09-14 ENCOUNTER — Other Ambulatory Visit: Payer: Medicare Other

## 2017-09-14 ENCOUNTER — Other Ambulatory Visit: Payer: Self-pay | Admitting: Radiology

## 2017-09-14 DIAGNOSIS — N2889 Other specified disorders of kidney and ureter: Secondary | ICD-10-CM | POA: Diagnosis not present

## 2017-09-14 DIAGNOSIS — N289 Disorder of kidney and ureter, unspecified: Secondary | ICD-10-CM

## 2017-09-14 NOTE — Telephone Encounter (Signed)
Attempted to reach pt by phone but it was busy. Was able to reach daughter. Notified daughter that pt needs to RTC for urine culture & that pre-admission testing appt has been scheduled for 09/17/2017 at 8:15. Daughter states she will have pt return call.

## 2017-09-17 ENCOUNTER — Other Ambulatory Visit: Payer: Self-pay

## 2017-09-17 ENCOUNTER — Encounter
Admission: RE | Admit: 2017-09-17 | Discharge: 2017-09-17 | Disposition: A | Discharge status: Home / Self Care | Payer: Medicare Other | Source: Ambulatory Visit | Attending: Urology | Admitting: Urology

## 2017-09-17 ENCOUNTER — Inpatient Hospital Stay: Payer: Medicare Other | Attending: Oncology

## 2017-09-17 HISTORY — PX: CORONARY ARTERY BYPASS GRAFT: SHX141

## 2017-09-17 LAB — CULTURE, URINE COMPREHENSIVE

## 2017-09-17 NOTE — Patient Instructions (Signed)
Your procedure is scheduled on: wed 09/19/17 Report to [place]. To find out your arrival time please call [phone] between 1PM - 3PM on Tue 09/18/17.  Remember: Instructions that are not followed completely may result in serious medical risk, up to and including death, or upon the discretion of your surgeon and anesthesiologist your surgery may need to be rescheduled.     _X__ 1. Do not eat food after midnight the night before your procedure.                 No gum chewing or hard candies. You may drink clear liquids up to 2 hours                 before you are scheduled to arrive for your surgery- DO not drink clear                 liquids within 2 hours of the start of your surgery.                 Clear Liquids include:  water, apple juice without pulp, clear carbohydrate                 drink such as Clearfast or Gatorade, Black Coffee or Tea (Do not add                 anything to coffee or tea).  __X__2.  On the morning of surgery brush your teeth with toothpaste and water, you                 may rinse your mouth with mouthwash if you wish.  Do not swallow any              toothpaste of mouthwash.     _X__ 3.  No Alcohol for 24 hours before or after surgery.   _X__ 4.  Do Not Smoke or use e-cigarettes For 24 Hours Prior to Your Surgery.                 Do not use any chewable tobacco products for at least 6 hours prior to                 surgery.  ____  5.  Bring all medications with you on the day of surgery if instructed.   __X__  6.  Notify your doctor if there is any change in your medical condition      (cold, fever, infections).     Do not wear jewelry, make-up, hairpins, clips or nail polish. Do not wear lotions, powders, or perfumes.  Do not shave 48 hours prior to surgery. Men may shave face and neck. Do not bring valuables to the hospital.    [facility] is not responsible for any belongings or  valuables.  Contacts, dentures/partials or body piercings may not be worn into surgery. Bring a case for your contacts, glasses or hearing aids, a denture cup will be supplied. Leave your suitcase in the car. After surgery it may be brought to your room. For patients admitted to the hospital, discharge time is determined by your treatment team.   Patients discharged the day of surgery will not be allowed to drive home.   Please read over the following fact sheets that you were given:   MRSA Information  __X__ Take these medicines the morning of surgery with A SIP OF WATER:  1. amlodipine  2. metoprolol  3. myrbetriq  4. Albuterol nebulizer treatment  5. Bring you inhaler  6.  ____ Fleet Enema (as directed)   ____ Use CHG Soap/SAGE wipes as directed  __X__ Use inhalers on the day of surgery SEE ABOVE  ____ Stop metformin/Janumet/Farxiga 2 days prior to surgery    ____ Take 1/2 of usual insulin dose the night before surgery. No insulin the morning          of surgery.   __X__ Stop Blood Thinners Coumadin/Plavix/Xarelto/Pleta/Pradaxa/Eliquis/Effient/Aspirin  on  According to Dr Cherrie Gauze notes you are to continue your eliquis. Do not take the day of procedure.  __X__ Stop Anti-inflammatories 7 days before surgery such as Advil, Ibuprofen, Motrin,  BC or Goodies Powder, Naprosyn, Naproxen, Aleve, Aspirin    __X__ Stop all herbal supplements, fish oil or vitamin E until after surgery.    ____ Bring C-Pap to the hospital.

## 2017-09-18 MED ORDER — CEFAZOLIN SODIUM-DEXTROSE 2-4 GM/100ML-% IV SOLN
2.0000 g | INTRAVENOUS | Status: AC
  Administered 2017-09-19: 2 g via INTRAVENOUS

## 2017-09-19 ENCOUNTER — Ambulatory Visit
Admission: RE | Admit: 2017-09-19 | Discharge: 2017-09-19 | Disposition: A | Discharge status: Home / Self Care | Payer: Medicare Other | Source: Ambulatory Visit | Attending: Urology | Admitting: Urology

## 2017-09-19 ENCOUNTER — Other Ambulatory Visit: Payer: Self-pay

## 2017-09-19 ENCOUNTER — Encounter
Admission: RE | Disposition: A | Discharge status: Home / Self Care | Payer: Self-pay | Source: Ambulatory Visit | Attending: Urology

## 2017-09-19 ENCOUNTER — Ambulatory Visit: Payer: Medicare Other | Admitting: Anesthesiology

## 2017-09-19 DIAGNOSIS — Z8551 Personal history of malignant neoplasm of bladder: Secondary | ICD-10-CM | POA: Insufficient documentation

## 2017-09-19 DIAGNOSIS — I10 Essential (primary) hypertension: Secondary | ICD-10-CM | POA: Diagnosis not present

## 2017-09-19 DIAGNOSIS — D45 Polycythemia vera: Secondary | ICD-10-CM | POA: Diagnosis not present

## 2017-09-19 DIAGNOSIS — Z9861 Coronary angioplasty status: Secondary | ICD-10-CM | POA: Diagnosis not present

## 2017-09-19 DIAGNOSIS — E7800 Pure hypercholesterolemia, unspecified: Secondary | ICD-10-CM | POA: Insufficient documentation

## 2017-09-19 DIAGNOSIS — K449 Diaphragmatic hernia without obstruction or gangrene: Secondary | ICD-10-CM | POA: Insufficient documentation

## 2017-09-19 DIAGNOSIS — Z7901 Long term (current) use of anticoagulants: Secondary | ICD-10-CM | POA: Insufficient documentation

## 2017-09-19 DIAGNOSIS — Z87891 Personal history of nicotine dependence: Secondary | ICD-10-CM | POA: Insufficient documentation

## 2017-09-19 DIAGNOSIS — N289 Disorder of kidney and ureter, unspecified: Secondary | ICD-10-CM | POA: Insufficient documentation

## 2017-09-19 DIAGNOSIS — D649 Anemia, unspecified: Secondary | ICD-10-CM | POA: Insufficient documentation

## 2017-09-19 DIAGNOSIS — R739 Hyperglycemia, unspecified: Secondary | ICD-10-CM | POA: Diagnosis not present

## 2017-09-19 DIAGNOSIS — R918 Other nonspecific abnormal finding of lung field: Secondary | ICD-10-CM | POA: Insufficient documentation

## 2017-09-19 DIAGNOSIS — J439 Emphysema, unspecified: Secondary | ICD-10-CM | POA: Insufficient documentation

## 2017-09-19 DIAGNOSIS — Z9221 Personal history of antineoplastic chemotherapy: Secondary | ICD-10-CM | POA: Diagnosis not present

## 2017-09-19 DIAGNOSIS — K219 Gastro-esophageal reflux disease without esophagitis: Secondary | ICD-10-CM | POA: Insufficient documentation

## 2017-09-19 DIAGNOSIS — N2889 Other specified disorders of kidney and ureter: Secondary | ICD-10-CM | POA: Diagnosis not present

## 2017-09-19 DIAGNOSIS — Z86711 Personal history of pulmonary embolism: Secondary | ICD-10-CM | POA: Insufficient documentation

## 2017-09-19 DIAGNOSIS — I739 Peripheral vascular disease, unspecified: Secondary | ICD-10-CM | POA: Insufficient documentation

## 2017-09-19 DIAGNOSIS — Z90721 Acquired absence of ovaries, unilateral: Secondary | ICD-10-CM | POA: Diagnosis not present

## 2017-09-19 DIAGNOSIS — R9341 Abnormal radiologic findings on diagnostic imaging of renal pelvis, ureter, or bladder: Secondary | ICD-10-CM | POA: Insufficient documentation

## 2017-09-19 DIAGNOSIS — M810 Age-related osteoporosis without current pathological fracture: Secondary | ICD-10-CM | POA: Diagnosis not present

## 2017-09-19 DIAGNOSIS — J45909 Unspecified asthma, uncomplicated: Secondary | ICD-10-CM | POA: Diagnosis not present

## 2017-09-19 DIAGNOSIS — Z808 Family history of malignant neoplasm of other organs or systems: Secondary | ICD-10-CM | POA: Insufficient documentation

## 2017-09-19 DIAGNOSIS — I2510 Atherosclerotic heart disease of native coronary artery without angina pectoris: Secondary | ICD-10-CM | POA: Diagnosis not present

## 2017-09-19 DIAGNOSIS — Z951 Presence of aortocoronary bypass graft: Secondary | ICD-10-CM | POA: Diagnosis not present

## 2017-09-19 DIAGNOSIS — Z8262 Family history of osteoporosis: Secondary | ICD-10-CM | POA: Insufficient documentation

## 2017-09-19 DIAGNOSIS — J449 Chronic obstructive pulmonary disease, unspecified: Secondary | ICD-10-CM | POA: Diagnosis not present

## 2017-09-19 DIAGNOSIS — Z86010 Personal history of colonic polyps: Secondary | ICD-10-CM | POA: Diagnosis not present

## 2017-09-19 DIAGNOSIS — K279 Peptic ulcer, site unspecified, unspecified as acute or chronic, without hemorrhage or perforation: Secondary | ICD-10-CM | POA: Insufficient documentation

## 2017-09-19 DIAGNOSIS — Z8379 Family history of other diseases of the digestive system: Secondary | ICD-10-CM | POA: Insufficient documentation

## 2017-09-19 DIAGNOSIS — Z8041 Family history of malignant neoplasm of ovary: Secondary | ICD-10-CM | POA: Insufficient documentation

## 2017-09-19 DIAGNOSIS — Z79899 Other long term (current) drug therapy: Secondary | ICD-10-CM | POA: Insufficient documentation

## 2017-09-19 DIAGNOSIS — Q6101 Congenital single renal cyst: Secondary | ICD-10-CM | POA: Insufficient documentation

## 2017-09-19 DIAGNOSIS — Z803 Family history of malignant neoplasm of breast: Secondary | ICD-10-CM | POA: Insufficient documentation

## 2017-09-19 DIAGNOSIS — Z811 Family history of alcohol abuse and dependence: Secondary | ICD-10-CM | POA: Insufficient documentation

## 2017-09-19 DIAGNOSIS — Z8261 Family history of arthritis: Secondary | ICD-10-CM | POA: Insufficient documentation

## 2017-09-19 DIAGNOSIS — Z801 Family history of malignant neoplasm of trachea, bronchus and lung: Secondary | ICD-10-CM | POA: Insufficient documentation

## 2017-09-19 DIAGNOSIS — Z888 Allergy status to other drugs, medicaments and biological substances status: Secondary | ICD-10-CM | POA: Insufficient documentation

## 2017-09-19 DIAGNOSIS — Z825 Family history of asthma and other chronic lower respiratory diseases: Secondary | ICD-10-CM | POA: Insufficient documentation

## 2017-09-19 DIAGNOSIS — Z8249 Family history of ischemic heart disease and other diseases of the circulatory system: Secondary | ICD-10-CM | POA: Insufficient documentation

## 2017-09-19 DIAGNOSIS — Z8489 Family history of other specified conditions: Secondary | ICD-10-CM | POA: Insufficient documentation

## 2017-09-19 DIAGNOSIS — Z923 Personal history of irradiation: Secondary | ICD-10-CM | POA: Insufficient documentation

## 2017-09-19 DIAGNOSIS — Z85828 Personal history of other malignant neoplasm of skin: Secondary | ICD-10-CM | POA: Insufficient documentation

## 2017-09-19 HISTORY — PX: URETEROSCOPY: SHX842

## 2017-09-19 HISTORY — PX: CYSTOSCOPY W/ RETROGRADES: SHX1426

## 2017-09-19 SURGERY — CYSTOSCOPY, WITH RETROGRADE PYELOGRAM
Anesthesia: General | Site: Ureter | Laterality: Left | Wound class: Clean Contaminated

## 2017-09-19 MED ORDER — FENTANYL CITRATE (PF) 100 MCG/2ML IJ SOLN
INTRAMUSCULAR | Status: AC
  Filled 2017-09-19: qty 2

## 2017-09-19 MED ORDER — PROPOFOL 10 MG/ML IV BOLUS
INTRAVENOUS | Status: DC | PRN
  Administered 2017-09-19: 140 mg via INTRAVENOUS

## 2017-09-19 MED ORDER — FAMOTIDINE 20 MG PO TABS
ORAL_TABLET | ORAL | Status: AC
  Filled 2017-09-19: qty 1

## 2017-09-19 MED ORDER — ONDANSETRON HCL 4 MG/2ML IJ SOLN
INTRAMUSCULAR | Status: DC | PRN
  Administered 2017-09-19: 4 mg via INTRAVENOUS

## 2017-09-19 MED ORDER — GLYCOPYRROLATE 0.2 MG/ML IJ SOLN
INTRAMUSCULAR | Status: AC
  Filled 2017-09-19: qty 1

## 2017-09-19 MED ORDER — DEXAMETHASONE SODIUM PHOSPHATE 10 MG/ML IJ SOLN
INTRAMUSCULAR | Status: AC
  Filled 2017-09-19: qty 1

## 2017-09-19 MED ORDER — IOTHALAMATE MEGLUMINE 43 % IV SOLN
INTRAVENOUS | Status: DC | PRN
  Administered 2017-09-19: 15 mL via URETHRAL

## 2017-09-19 MED ORDER — OXYCODONE HCL 5 MG/5ML PO SOLN: 5.0000 mg | Freq: Once | ORAL | Status: DC | PRN

## 2017-09-19 MED ORDER — FENTANYL CITRATE (PF) 100 MCG/2ML IJ SOLN: 25.0000 ug | INTRAMUSCULAR | Status: DC | PRN

## 2017-09-19 MED ORDER — LACTATED RINGERS IV SOLN
INTRAVENOUS | Status: DC
  Administered 2017-09-19: 10:00:00 via INTRAVENOUS

## 2017-09-19 MED ORDER — SEVOFLURANE IN SOLN
RESPIRATORY_TRACT | Status: AC
Start: 2017-09-19 — End: ?
  Filled 2017-09-19: qty 250

## 2017-09-19 MED ORDER — PROPOFOL 500 MG/50ML IV EMUL
INTRAVENOUS | Status: AC
  Filled 2017-09-19: qty 50

## 2017-09-19 MED ORDER — FAMOTIDINE 20 MG PO TABS
20.0000 mg | ORAL_TABLET | Freq: Once | ORAL | Status: AC
  Administered 2017-09-19: 20 mg via ORAL

## 2017-09-19 MED ORDER — SODIUM CHLORIDE 0.9 % IV SOLN
INTRAVENOUS | Status: DC | PRN
  Administered 2017-09-19: 11:00:00 via INTRAVENOUS

## 2017-09-19 MED ORDER — LIDOCAINE 2% (20 MG/ML) 5 ML SYRINGE
INTRAMUSCULAR | Status: DC | PRN
  Administered 2017-09-19: 100 mg via INTRAVENOUS

## 2017-09-19 MED ORDER — CEFAZOLIN SODIUM-DEXTROSE 2-4 GM/100ML-% IV SOLN
INTRAVENOUS | Status: AC
  Filled 2017-09-19: qty 100

## 2017-09-19 MED ORDER — ONDANSETRON HCL 4 MG/2ML IJ SOLN
INTRAMUSCULAR | Status: AC
  Filled 2017-09-19: qty 2

## 2017-09-19 MED ORDER — DEXAMETHASONE SODIUM PHOSPHATE 10 MG/ML IJ SOLN
INTRAMUSCULAR | Status: DC | PRN
  Administered 2017-09-19: 10 mg via INTRAVENOUS

## 2017-09-19 MED ORDER — FENTANYL CITRATE (PF) 100 MCG/2ML IJ SOLN
INTRAMUSCULAR | Status: DC | PRN
  Administered 2017-09-19: 50 ug via INTRAVENOUS

## 2017-09-19 MED ORDER — MEPERIDINE HCL 50 MG/ML IJ SOLN: 6.2500 mg | INTRAMUSCULAR | Status: DC | PRN

## 2017-09-19 MED ORDER — PROMETHAZINE HCL 25 MG/ML IJ SOLN: 6.2500 mg | INTRAMUSCULAR | Status: DC | PRN

## 2017-09-19 MED ORDER — OXYCODONE HCL 5 MG PO TABS: 5.0000 mg | ORAL_TABLET | Freq: Once | ORAL | Status: DC | PRN

## 2017-09-19 MED ORDER — LIDOCAINE HCL (PF) 2 % IJ SOLN
INTRAMUSCULAR | Status: AC
Start: 2017-09-19 — End: ?
  Filled 2017-09-19: qty 10

## 2017-09-19 SURGICAL SUPPLY — 30 items
BAG DRAIN CYSTO-URO LG1000N (MISCELLANEOUS) ×4 IMPLANT
BRUSH SCRUB EZ  4% CHG (MISCELLANEOUS) ×1
BRUSH SCRUB EZ 1% IODOPHOR (MISCELLANEOUS) ×4 IMPLANT
BRUSH SCRUB EZ 4% CHG (MISCELLANEOUS) ×3 IMPLANT
CATH URETL 5X70 OPEN END (CATHETERS) ×4 IMPLANT
CNTNR SPEC 2.5X3XGRAD LEK (MISCELLANEOUS)
CONRAY 43 FOR UROLOGY 50M (MISCELLANEOUS) ×4 IMPLANT
CONT SPEC 4OZ STER OR WHT (MISCELLANEOUS)
CONTAINER SPEC 2.5X3XGRAD LEK (MISCELLANEOUS) IMPLANT
DRAPE UTILITY 15X26 TOWEL STRL (DRAPES) ×4 IMPLANT
GLOVE BIO SURGEON STRL SZ 6.5 (GLOVE) ×4 IMPLANT
GOWN STRL REUS W/ TWL LRG LVL3 (GOWN DISPOSABLE) ×6 IMPLANT
GOWN STRL REUS W/TWL LRG LVL3 (GOWN DISPOSABLE) ×2
GUIDEWIRE GREEN .038 145CM (MISCELLANEOUS) IMPLANT
GUIDEWIRE SUPER STIFF .035X180 (WIRE) ×4 IMPLANT
INFUSOR MANOMETER BAG 3000ML (MISCELLANEOUS) ×4 IMPLANT
INTRODUCER DILATOR DOUBLE (INTRODUCER) IMPLANT
KIT TURNOVER CYSTO (KITS) ×4 IMPLANT
PACK CYSTO AR (MISCELLANEOUS) ×4 IMPLANT
SENSORWIRE 0.038 NOT ANGLED (WIRE) ×4
SET CYSTO W/LG BORE CLAMP LF (SET/KITS/TRAYS/PACK) ×4 IMPLANT
SHEATH URETERAL 12FRX35CM (MISCELLANEOUS) IMPLANT
SOL .9 NS 3000ML IRR  AL (IV SOLUTION) ×1
SOL .9 NS 3000ML IRR UROMATIC (IV SOLUTION) ×3 IMPLANT
STENT URET 6FRX24 CONTOUR (STENTS) IMPLANT
STENT URET 6FRX26 CONTOUR (STENTS) IMPLANT
SURGILUBE 2OZ TUBE FLIPTOP (MISCELLANEOUS) ×4 IMPLANT
SYRINGE IRR TOOMEY STRL 70CC (SYRINGE) ×4 IMPLANT
WATER STERILE IRR 1000ML POUR (IV SOLUTION) ×4 IMPLANT
WIRE SENSOR 0.038 NOT ANGLED (WIRE) ×3 IMPLANT

## 2017-09-19 NOTE — OR Nursing (Signed)
Discussed discharge instructions with pt and family. All voices understanding.

## 2017-09-19 NOTE — Interval H&P Note (Signed)
History and Physical Interval Note:  09/19/2017 11:03 AM  Tina [name]  has presented today for surgery, with the diagnosis of left ureteral mass  The various methods of treatment have been discussed with the patient and family. After consideration of risks, benefits and other options for treatment, the patient has consented to  Procedure(s): CYSTOSCOPY WITH STENT PLACEMENT (Left) CYSTOSCOPY WITH RETROGRADE PYELOGRAM (Bilateral) URETEROSCOPY (Left) URETERAL BIOPSY (Left) HOLMIUM LASER APPLICATION (laser ablation of ureteral mass) (Left) as a surgical intervention .  The patient's history has been reviewed, patient examined, no change in status, stable for surgery.  I have reviewed the patient's chart and labs.  Questions were answered to the patient's satisfaction.    RRR cTAB  Hollice Espy

## 2017-09-19 NOTE — Anesthesia Preprocedure Evaluation (Signed)
Anesthesia Evaluation  Patient identified by MRN, date of birth, ID band Patient awake    Reviewed: Allergy & Precautions, NPO status , Patient's Chart, lab work & pertinent test results  History of Anesthesia Complications Negative for: history of anesthetic complications  Airway Mallampati: II  TM Distance: >3 FB Neck ROM: Full    Dental  (+) Poor Dentition   Pulmonary asthma , neg sleep apnea, COPD,  COPD inhaler, former smoker,    breath sounds clear to auscultation- rhonchi (-) wheezing      Cardiovascular hypertension, + CAD, + Cardiac Stents and + Peripheral Vascular Disease  (-) CABG  Rhythm:Regular Rate:Normal - Systolic murmurs and - Diastolic murmurs Stress test 11/03/16: normal stress test  Echo 06/15/16: - Left ventricle: The cavity size was normal. Systolic function was   normal. The estimated ejection fraction was in the range of 55%   to 65%. - Aortic valve: Valve area (Vmax): 2.64 cm^2. - Mitral valve: There was mild regurgitation.   Neuro/Psych negative neurological ROS  negative psych ROS   GI/Hepatic Neg liver ROS, hiatal hernia, PUD, GERD  ,  Endo/Other  negative endocrine ROSneg diabetes  Renal/GU Renal disease     Musculoskeletal negative musculoskeletal ROS (+)   Abdominal (+) - obese,   Peds  Hematology  (+) anemia ,   Anesthesia Other Findings Past Medical History: No date: Anemia No date: Asthma No date: Bladder cancer (HCC) No date: BRCA positive No date: CAD (coronary artery disease) No date: COPD (chronic obstructive pulmonary disease) (HCC) No date: Emphysema lung (HCC) No date: GERD (gastroesophageal reflux disease) No date: History of colon polyps No date: Hypercholesterolemia No date: Hyperglycemia No date: Hypertension No date: Lung nodules No date: Osteoporosis 11/25/2014: Personal history of tobacco use, presenting hazards to  health No date: Polycythemia  vera(238.4) No date: Renal cyst No date: Skin cancer   Reproductive/Obstetrics                            Anesthesia Physical Anesthesia Plan  ASA: III  Anesthesia Plan: General   Post-op Pain Management:    Induction: Intravenous  PONV Risk Score and Plan: 2 and Ondansetron and Dexamethasone  Airway Management Planned: LMA  Additional Equipment:   Intra-op Plan:   Post-operative Plan:   Informed Consent: I have reviewed the patients History and Physical, chart, labs and discussed the procedure including the risks, benefits and alternatives for the proposed anesthesia with the patient or authorized representative who has indicated his/her understanding and acceptance.   Dental advisory given  Plan Discussed with: CRNA and Anesthesiologist  Anesthesia Plan Comments: (Plan for LMA unless relaxation is needed, then will proceed with GETA)        Anesthesia Quick Evaluation

## 2017-09-19 NOTE — Anesthesia Post-op Follow-up Note (Signed)
Anesthesia QCDR form completed.        

## 2017-09-19 NOTE — Discharge Instructions (Addendum)
Ureteroscopy, Care After This sheet gives you information about how to care for yourself after your procedure. Your health care provider may also give you more specific instructions. If you have problems or questions, contact your health care provider. What can I expect after the procedure? After the procedure, it is common to have:  A burning sensation when you urinate.  Blood in your urine.  Mild discomfort in the bladder area or kidney area when urinating.  Needing to urinate more often or urgently.  Follow these instructions at home: Medicines  Take over-the-counter and prescription medicines only as told by your health care provider.  If you were prescribed an antibiotic medicine, take it as told by your health care provider. Do not stop taking the antibiotic even if you start to feel better. General instructions   Donot drive for 24 hours if you were given a medicine to help you relax (sedative) during your procedure.  To relieve burning, try taking a warm bath or holding a warm washcloth over your groin.  Drink enough fluid to keep your urine clear or pale yellow. ? Drink two 8-ounce glasses of water every hour for the first 2 hours after you get home. ? Continue to drink water often at home.  You can eat what you usually do.  Keep all follow-up visits as told by your health care provider. This is important. ? If you had a tube placed to keep urine flowing (ureteral stent), ask your health care provider when you need to return to have it removed. Contact a health care provider if:  You have chills or a fever.  You have burning pain for longer than 24 hours after the procedure.  You have blood in your urine for longer than 24 hours after the procedure. Get help right away if:  You have large amounts of blood in your urine.  You have blood clots in your urine.  You have very bad pain.  You have chest pain or trouble breathing.  You are unable to urinate and  you have the feeling of a full bladder. This information is not intended to replace advice given to you by your health care provider. Make sure you discuss any questions you have with your health care provider. Document Released: 05/06/2013 Document Revised: 02/15/2016 Document Reviewed: 02/11/2016 Elsevier Interactive Patient Education  2018 [employer]   1) The drugs that you were given will stay in your system until tomorrow so for the next 24 hours you should not:  A) Drive an automobile B) Make any legal decisions C) Drink any alcoholic beverage   2) You may resume regular meals tomorrow.  Today it is better to start with liquids and gradually work up to solid foods.  You may eat anything you prefer, but it is better to start with liquids, then soup and crackers, and gradually work up to solid foods.   3) Please notify your doctor immediately if you have any unusual bleeding, trouble breathing, redness and pain at the surgery site, drainage, fever, or pain not relieved by medication.    4) Additional Instructions:        Please contact your physician with any problems or Same Day Surgery at [phone], Monday through Friday 6 am to 4 pm, or [place] at [facility] number at [phone].

## 2017-09-19 NOTE — Anesthesia Postprocedure Evaluation (Signed)
Anesthesia Post Note  Patient: Tina [name]  Procedure(s) Performed: CYSTOSCOPY WITH RETROGRADE PYELOGRAM (Bilateral Ureter) URETEROSCOPY (Left Ureter)  Patient location during evaluation: PACU Anesthesia Type: General Level of consciousness: awake and alert and oriented Pain management: pain level controlled Vital Signs Assessment: post-procedure vital signs reviewed and stable Respiratory status: spontaneous breathing, nonlabored ventilation and respiratory function stable Cardiovascular status: blood pressure returned to baseline and stable Postop Assessment: no signs of nausea or vomiting Anesthetic complications: no     Last Vitals:  Vitals:   09/19/17 1241 09/19/17 1246  BP: (!) 195/73 (!) 196/81  Pulse: 67 67  Resp: 20 18  Temp: 36.4 C   SpO2: 96% 96%    Last Pain:  Vitals:   09/19/17 1241  TempSrc:   PainSc: 0-No pain                 Raeden Schippers

## 2017-09-19 NOTE — Op Note (Signed)
Date of procedure: 09/19/17  Preoperative diagnosis:  1. History of bladder cancer 2. Left ureteral filling defect  Postoperative diagnosis:  1. History of bladder cancer 2. Normal left retrograde and negative ureteroscopy  Procedure: 1. Cystoscopy 2. Bilateral retrograde pyelogram 3. Diagnostic left ureteroscopy  Surgeon: Hollice Espy, MD  Anesthesia: General  Complications: None  Intraoperative findings: Bladder with stellate scar in the trigone with a small central area of adherent necrosis but no viable tumor identified.  Bilateral retrograde pyelogram unremarkable.  Negative left ureteroscopy.  EBL: Minimal  Specimens: None  Drains: None  Indication: Tina [name] is a 83 y.o. 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  After reviewing the management options for treatment, she elected to proceed with the above surgical procedure(s). We have discussed the potential benefits and risks of the procedure, side effects of the proposed treatment, the likelihood of the patient achieving the goals of the procedure, and any potential problems that might occur during the procedure or recuperation. Informed consent has been obtained.  Description of procedure:  The patient was taken to the operating room and general anesthesia was induced.  The patient was placed in the dorsal lithotomy position, prepped and draped in the usual sterile fashion, and preoperative antibiotics were administered. A preoperative time-out was performed.   A 21 French scope was advanced per urethra into the bladder.  The bladder was carefully inspected and noted to be free of any tumors lesions or ulcerations.  There was a stellate scar appreciated at the level of the trigone with a very small area of necrosis adherent but no active visible viable tumor or concern for underlying lesions or  pathology.  Attention was first of the right ureteral orifice which was cannulated using a 5 [place] open-ended ureteral catheter.  Gentle retrograde pyelogram was performed which revealed a delicate appearing ureter without filling defects or hydronephrosis.  Attention was then turned to the left ureteral orifice which was also cannulated.  Retrograde pyelogram was unremarkable on the side as well and the ureteral defect appreciated on previous CT scan was not appreciated today.  There are no other filling defects or hydronephrosis.  At this point in time as a precaution, Super Stiff wire was placed up to level of the kidney without difficulty.  An 7 [place] single channel flexible ureteroscope was advanced over the Super Stiff wire quite easily into the upper tract collecting system.  The wire was withdrawn.  Formal pyeloscopy was performed using the previous retrograde pyelogram as a roadmap to ensure that each and every calyx was surveyed.  There is no viable tumor identified.  The scope was very carefully backed down the length the ureter inspecting along the way.  There are no ureteral injuries, lesions, or any other concerning findings.  This was a negative ureteroscopy.  At this point time, the bladder was drained and the scope was removed.  The patient was clean and dry, repositioned in supine position, reversed from anesthesia, and taken to the PACU in stable condition.  Plan: We will plan on a surveillance cystoscopy in 3 months.  Findings were discussed with the patient's family.  I will also send a note to Dr. Janese Banks.  Hollice Espy, M.D.

## 2017-09-19 NOTE — Transfer of Care (Signed)
Immediate Anesthesia Transfer of Care Note  Patient: Tina [name]  Procedure(s) Performed: CYSTOSCOPY WITH RETROGRADE PYELOGRAM (Bilateral Ureter) URETEROSCOPY (Left Ureter)  Patient Location: PACU  Anesthesia Type:General  Level of Consciousness: awake, alert  and oriented  Airway & Oxygen Therapy: Patient Spontanous Breathing and Patient connected to face mask oxygen  Post-op Assessment: Report given to RN and Post -op Vital signs reviewed and stable  Post vital signs: Reviewed and stable  Last Vitals:  Vitals Value Taken Time  BP    Temp    Pulse 50 09/19/2017 11:53 AM  Resp 9 09/19/2017 11:53 AM  SpO2 100 % 09/19/2017 11:53 AM  Vitals shown include unvalidated device data.  Last Pain:  Vitals:   09/19/17 0935  TempSrc: Tympanic  PainSc: 0-No pain         Complications: No apparent anesthesia complications

## 2017-09-19 NOTE — Anesthesia Procedure Notes (Signed)
Procedure Name: LMA Insertion Date/Time: 09/19/2017 11:38 AM Performed by: Marsh Dolly, CRNA Pre-anesthesia Checklist: Patient identified, Patient being monitored, Timeout performed, Emergency Drugs available and Suction available Patient Re-evaluated:Patient Re-evaluated prior to induction Oxygen Delivery Method: Circle system utilized Preoxygenation: Pre-oxygenation with 100% oxygen Induction Type: IV induction Ventilation: Mask ventilation without difficulty LMA: LMA inserted LMA Size: 4.0 Tube type: Oral Number of attempts: 1 Placement Confirmation: positive ETCO2 and breath sounds checked- equal and bilateral Tube secured with: Tape Dental Injury: Teeth and Oropharynx as per pre-operative assessment

## 2017-09-20 ENCOUNTER — Encounter: Payer: Self-pay | Admitting: Urology

## 2017-09-23 ENCOUNTER — Other Ambulatory Visit: Payer: Self-pay | Admitting: Internal Medicine

## 2017-09-28 ENCOUNTER — Other Ambulatory Visit: Payer: Medicare Other | Admitting: Urology

## 2017-10-05 ENCOUNTER — Ambulatory Visit (INDEPENDENT_AMBULATORY_CARE_PROVIDER_SITE_OTHER): Payer: Medicare Other | Admitting: Internal Medicine

## 2017-10-05 ENCOUNTER — Encounter: Payer: Self-pay | Admitting: Internal Medicine

## 2017-10-05 VITALS — BP 160/64 | HR 79 | Temp 98.3°F | Resp 18 | Wt 133.2 lb

## 2017-10-05 DIAGNOSIS — C679 Malignant neoplasm of bladder, unspecified: Secondary | ICD-10-CM

## 2017-10-05 DIAGNOSIS — E7800 Pure hypercholesterolemia, unspecified: Secondary | ICD-10-CM

## 2017-10-05 DIAGNOSIS — D45 Polycythemia vera: Secondary | ICD-10-CM

## 2017-10-05 DIAGNOSIS — D751 Secondary polycythemia: Secondary | ICD-10-CM

## 2017-10-05 DIAGNOSIS — I10 Essential (primary) hypertension: Secondary | ICD-10-CM | POA: Diagnosis not present

## 2017-10-05 DIAGNOSIS — I2510 Atherosclerotic heart disease of native coronary artery without angina pectoris: Secondary | ICD-10-CM

## 2017-10-05 DIAGNOSIS — J449 Chronic obstructive pulmonary disease, unspecified: Secondary | ICD-10-CM | POA: Diagnosis not present

## 2017-10-05 DIAGNOSIS — N183 Chronic kidney disease, stage 3 unspecified: Secondary | ICD-10-CM

## 2017-10-05 DIAGNOSIS — R739 Hyperglycemia, unspecified: Secondary | ICD-10-CM

## 2017-10-05 DIAGNOSIS — J9601 Acute respiratory failure with hypoxia: Secondary | ICD-10-CM | POA: Diagnosis not present

## 2017-10-05 LAB — BASIC METABOLIC PANEL
BUN: 44 mg/dL — ABNORMAL HIGH (ref 6–23)
CALCIUM: 10.4 mg/dL (ref 8.4–10.5)
CO2: 29 meq/L (ref 19–32)
CREATININE: 1.79 mg/dL — AB (ref 0.40–1.20)
Chloride: 101 mEq/L (ref 96–112)
GFR: 28.9 mL/min — AB (ref >60.00)
Glucose, Bld: 143 mg/dL — ABNORMAL HIGH (ref 70–99)
Potassium: 4.2 mEq/L (ref 3.5–5.1)
Sodium: 140 mEq/L (ref 135–145)

## 2017-10-05 MED ORDER — METOPROLOL TARTRATE 25 MG PO TABS: 25.0000 mg | ORAL_TABLET | Freq: Two times a day (BID) | ORAL | 1 refills | Status: DC

## 2017-10-05 MED ORDER — AMLODIPINE BESYLATE 5 MG PO TABS: ORAL_TABLET | ORAL | 1 refills | Status: DC

## 2017-10-05 NOTE — Progress Notes (Signed)
[name], female   DOB: [birth year], 83 y.o.   MRN: [id number]   Subjective:    [name], female    DOB: 1934/12/20, 83 y.o.   MRN: [id number]  HPI  Patient here for a scheduled follow up. Was recently evaluated in ER 09/05/17 for elevated blood pressure.  Metoprolol was increased to 36m bid.  She recently saw Dr PSaralyn Pilar  He added back triam/hctz.  She has been taking this daily since.  Noticed decreased lower extremity swelling.  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  She reports still having increased fatigue.  She is starting to get out more.  Denies any chest pain.  Breathing overall stable.  No increased cough or congestion.  Eating.  No vomiting.  No abdominal pain.  Bowels are moving.  Some pain beneath her buttocks.  Is getting better.  Desires no further evaluation or testing.  States her blood pressure is averaging 150s (occasionally 170s).     Past Medical History:  Diagnosis Date  . Anemia   . Asthma   . Bladder cancer ([place]   . BRCA positive   . CAD (coronary artery disease)   . COPD (chronic obstructive pulmonary disease) ([place]   . [place]sema lung ([place]   . GERD (gastroesophageal reflux disease)   . History of colon polyps   . Hypercholesterolemia   . Hyperglycemia   . Hypertension   . Lung nodules   . Osteoporosis   . Personal history of tobacco use, presenting hazards to health 11/25/2014  . Polycythemia vera(238.4)   . Renal cyst   . Skin cancer    Past Surgical History:  Procedure Laterality Date  . ANGIOPLASTY     coronary (x1)  . COLONOSCOPY WITH PROPOFOL N/A 03/02/2015   Procedure: COLONOSCOPY WITH PROPOFOL;  Surgeon: MLollie Sails MD;  [facility];  Service: Endoscopy;  Laterality: N/A;  . COLONOSCOPY WITH PROPOFOL N/A 02/13/2017   Procedure: COLONOSCOPY WITH PROPOFOL;  Surgeon: WLucilla Lame MD;  [facility];  Service: Endoscopy;  Laterality: N/A;  . CORONARY ANGIOPLASTY    . CORONARY ARTERY BYPASS GRAFT  09/17/2017   pt denies  . CYSTOSCOPY W/ RETROGRADES Bilateral 02/13/2017   Procedure: CYSTOSCOPY WITH RETROGRADE PYELOGRAM;  Surgeon: BHollice Espy MD;  Location: [facility] ORS;  Service: Urology;  Laterality: Bilateral;  . CYSTOSCOPY W/ RETROGRADES Bilateral 09/19/2017   Procedure: CYSTOSCOPY WITH RETROGRADE PYELOGRAM;  Surgeon: BHollice Espy MD;  Location: [facility] ORS;  Service: Urology;  Laterality: Bilateral;  . CYSTOSCOPY WITH BIOPSY  03/26/2017   Procedure: CYSTOSCOPY WITH BIOPSY;  Surgeon: BHollice Espy MD;  Location: [facility] ORS;  Service: Urology;;  . CConsuela MimesWITH STENT PLACEMENT Bilateral 02/13/2017   Procedure: CYSTOSCOPY WITH STENT PLACEMENT;  Surgeon: BHollice Espy MD;  Location: [facility] ORS;  Service: Urology;  Laterality: Bilateral;  . CYSTOSCOPY WITH STENT PLACEMENT Bilateral 03/26/2017   Procedure: CYSTOSCOPY WITH STENT EXCHANGE;  Surgeon: BHollice Espy MD;  Location: [facility] ORS;  Service: Urology;  Laterality: Bilateral;  . ESOPHAGOGASTRODUODENOSCOPY (EGD) WITH PROPOFOL N/A 02/13/2017   Procedure: ESOPHAGOGASTRODUODENOSCOPY (EGD) WITH PROPOFOL;  Surgeon: WLucilla Lame MD;  [facility];  Service: Endoscopy;  Laterality: N/A;  . PORTA CATH INSERTION N/A 02/28/2017   Procedure: PORTA CATH INSERTION;  Surgeon: DAlgernon Huxley MD;  [facility];  Service: Cardiovascular;  Laterality: N/A;  . SALPINGOOPHORECTOMY    .  TONSILECTOMY/ADENOIDECTOMY WITH MYRINGOTOMY    . TONSILLECTOMY    . TRANSURETHRAL RESECTION OF BLADDER TUMOR N/A 02/13/2017   Procedure: TRANSURETHRAL RESECTION OF BLADDER TUMOR (TURBT);  Surgeon: Hollice Espy, MD;  Location: [facility] ORS;  Service: Urology;  Laterality: N/A;  . TUBAL LIGATION    . UPPER GI ENDOSCOPY  02/13/2017  . URETEROSCOPY Left 09/19/2017   Procedure: URETEROSCOPY;  Surgeon: Hollice Espy, MD;  Location: [facility] ORS;   Service: Urology;  Laterality: Left;  Marland Kitchen VISCERAL ARTERY INTERVENTION N/A 02/12/2017   Procedure: VISCERAL ARTERY INTERVENTION;  Surgeon: Algernon Huxley, MD;  Location: [place] CV LAB;  Service: Cardiovascular;  Laterality: N/A;   Family History  Problem Relation Age of Onset  . Cirrhosis Father        died age 4  . Alcohol abuse Father   . Asthma Mother   . Congestive Heart Failure Mother   . Breast cancer Mother        2 (1/2 sisters)  . Osteoarthritis Mother   . Colon cancer Mother   . Lupus Sister   . Alcohol abuse Sister   . Ovarian cancer Sister   . Osteoporosis Sister   . Skin cancer Sister   . Breast cancer Cousin   . Breast cancer Maternal Aunt    Social History   Socioeconomic History  . Marital status: Widowed    Spouse name: Not on file  . Number of children: 2  . Years of education: Not on file  . Highest education level: Not on file  Occupational History  . Not on file  Social Needs  . Financial resource strain: Not on file  . Food insecurity:    Worry: Not on file    Inability: Not on file  . Transportation needs:    Medical: Not on file    Non-medical: Not on file  Tobacco Use  . Smoking status: Former Smoker    Packs/day: 1.00    Years: 45.00    Pack years: 45.00    Types: Cigarettes    Last attempt to quit: 11/11/2003    Years since quitting: 13.9  . Smokeless tobacco: Never Used  Substance and Sexual Activity  . Alcohol use: No    Alcohol/week: 0.0 oz    Comment: occasional  . Drug use: No  . Sexual activity: Never  Lifestyle  . Physical activity:    Days per week: Not on file    Minutes per session: Not on file  . Stress: Not on file  Relationships  . Social connections:    Talks on phone: Not on file    Gets together: Not on file    Attends religious service: Not on file    Active member of club or organization: Not on file    Attends meetings of clubs or organizations: Not on file    Relationship status: Not on file  Other  Topics Concern  . Not on file  Social History Narrative  . Not on file    Outpatient Encounter Medications as of 10/05/2017  Medication Sig  . albuterol (PROVENTIL HFA;VENTOLIN HFA) 108 (90 Base) MCG/ACT inhaler Inhale 2 puffs into the lungs every 6 (six) hours as needed for wheezing or shortness of breath.  Marland Kitchen amLODipine (NORVASC) 5 MG tablet Take one tablet bid  . CALCIUM CARBONATE PO Take 1 tablet by mouth every Monday, Wednesday, and Friday.  . cholecalciferol (VITAMIN D) 1000 units tablet Take 1,000 Units by mouth daily.  Marland Kitchen ELIQUIS 5 MG  TABS tablet TAKE 1 TABLET BY MOUTH TWO  TIMES DAILY  . fluticasone (FLONASE) 50 MCG/ACT nasal spray Place 2 sprays into both nostrils daily as needed for allergies.  Marland Kitchen ipratropium-albuterol (DUONEB) 0.5-2.5 (3) MG/3ML SOLN Take 3 mLs by nebulization every 6 (six) hours as needed (for shortness of breath).  . losartan (COZAAR) 100 MG tablet TAKE 1 TABLET BY MOUTH  DAILY (Patient taking differently: TAKE 155m BY MOUTH  DAILY)  . lovastatin (MEVACOR) 40 MG tablet TAKE 1 TABLET BY MOUTH  DAILY (Patient taking differently: TAKE 1 TABLET BY MOUTH  DAILY (evening))  . metoprolol tartrate (LOPRESSOR) 25 MG tablet Take 1 tablet (25 mg total) by mouth 2 (two) times daily.  .Marland KitchenMYRBETRIQ 25 MG TB24 tablet Take 1 tablet by mouth daily.  . potassium chloride (K-DUR,KLOR-CON) 10 MEQ tablet Take 1 tablet (10 mEq total) by mouth daily.  .Marland Kitchentriamterene-hydrochlorothiazide (DYAZIDE) 37.5-25 MG capsule Take 1 capsule by mouth daily.  . [DISCONTINUED] amLODipine (NORVASC) 5 MG tablet TAKE 1 TABLET BY MOUTH  DAILY  . [DISCONTINUED] metoprolol tartrate (LOPRESSOR) 25 MG tablet TAKE ONE-HALF TABLET BY  MOUTH TWO TIMES DAILY (Patient taking differently: TAKE ONE TABLET BY  MOUTH TWO TIMES DAILY)  . [DISCONTINUED] cloNIDine (CATAPRES) tablet 0.1 mg    No facility-administered encounter medications on file as of 10/05/2017.     Review of Systems  Constitutional: Positive for  fatigue. Negative for appetite change.  HENT: Negative for congestion and sinus pressure.   Respiratory: Negative for cough and chest tightness.        Breathing stable.   Cardiovascular: Negative for chest pain and palpitations.       Leg swelling improved.    Gastrointestinal: Negative for abdominal pain, diarrhea, nausea and vomiting.  Genitourinary: Negative for difficulty urinating and dysuria.  Musculoskeletal: Negative for joint swelling and myalgias.       Pain beneath her buttocks.    Skin: Negative for color change and rash.  Neurological: Negative for dizziness, light-headedness and headaches.  Psychiatric/Behavioral: Negative for agitation and dysphoric mood.       Objective:    Physical Exam  Constitutional: She appears well-developed and well-nourished. No distress.  HENT:  Nose: Nose normal.  Mouth/Throat: Oropharynx is clear and moist.  Neck: Neck supple. No thyromegaly present.  Cardiovascular: Normal rate and regular rhythm.  Pulmonary/Chest: Breath sounds normal. No respiratory distress. She has no wheezes.  Abdominal: Soft. Bowel sounds are normal. There is no tenderness.  Musculoskeletal: She exhibits no edema or tenderness.  Lymphadenopathy:    She has no cervical adenopathy.  Skin: No rash noted. No erythema.  Psychiatric: She has a normal mood and affect. Her behavior is normal.    BP (!) 160/64 (BP Location: Left Arm, Patient Position: Sitting, Cuff Size: Normal)   Pulse 79   Temp 98.3 F (36.8 C) (Oral)   Resp 18   Wt 133 lb 3.2 oz (60.4 kg)   SpO2 96%   BMI 22.17 kg/m  Wt Readings from Last 3 Encounters:  10/05/17 133 lb 3.2 oz (60.4 kg)  09/19/17 135 lb (61.2 kg)  09/17/17 135 lb (61.2 kg)     Lab Results  Component Value Date   WBC 10.0 09/05/2017   HGB 15.9 09/05/2017   HCT 46.0 09/05/2017   PLT 152 09/05/2017   GLUCOSE 143 (H) 10/05/2017   CHOL 158 02/16/2016   TRIG 86.0 02/16/2016   HDL 60.10 02/16/2016   LDLCALC 81  02/16/2016   ALT  14 09/05/2017   AST 21 09/05/2017   NA 140 10/05/2017   K 4.2 10/05/2017   CL 101 10/05/2017   CREATININE 1.79 (H) 10/05/2017   BUN 44 (H) 10/05/2017   CO2 29 10/05/2017   TSH 1.61 02/08/2017   HGBA1C 5.1 05/25/2017       Assessment & Plan:   Problem List Items Addressed This Visit    Acute respiratory failure with hypoxia (HCC)   CAD (coronary artery disease)    Followed by cardiology.  Just evaluated.  Added back triam/hctz.  No other changes made.  Follow.        Relevant Medications   amLODipine (NORVASC) 5 MG tablet   metoprolol tartrate (LOPRESSOR) 25 MG tablet   CKD (chronic kidney disease), stage III (HCC)    Avoid antiinflammatories.  Given recent addition of triam/hctz - recheck metabolic panel.        COPD (chronic obstructive pulmonary disease) (HCC)    Breathing stable.        Essential hypertension - Primary    Blood pressure still elevated.  On triam/hctz.  Recheck metabolic panel.  Increase amlodipine to 60m bid. Monitor for increased swelling.        Relevant Medications   amLODipine (NORVASC) 5 MG tablet   metoprolol tartrate (LOPRESSOR) 25 MG tablet   Other Relevant Orders   Basic metabolic panel (Completed)   Hypercholesterolemia    On lovastatin.  Follow lipid panel and liver function tests.        Relevant Medications   amLODipine (NORVASC) 5 MG tablet   metoprolol tartrate (LOPRESSOR) 25 MG tablet   Hyperglycemia    Low carb diet and exercise.  Follow met b and a1c.        Polycythemia vera ([place]   Polycythemia, secondary    Followed by hematology.        Small cell carcinoma of bladder ([facility]    Seeing urology and oncology.            SEinar Pheasant MD

## 2017-10-05 NOTE — Patient Instructions (Signed)
Increase amlodipine to one tablet in the am and one tablet in the pm.

## 2017-10-07 ENCOUNTER — Other Ambulatory Visit: Payer: Self-pay | Admitting: Internal Medicine

## 2017-10-07 DIAGNOSIS — R7989 Other specified abnormal findings of blood chemistry: Secondary | ICD-10-CM

## 2017-10-07 NOTE — Progress Notes (Signed)
Orders placed for f/u labs.  

## 2017-10-08 ENCOUNTER — Encounter: Payer: Self-pay | Admitting: Internal Medicine

## 2017-10-08 DIAGNOSIS — J9601 Acute respiratory failure with hypoxia: Secondary | ICD-10-CM | POA: Insufficient documentation

## 2017-10-08 NOTE — Assessment & Plan Note (Signed)
Blood pressure still elevated.  On triam/hctz.  Recheck metabolic panel.  Increase amlodipine to 5mg  bid. Monitor for increased swelling.

## 2017-10-08 NOTE — Assessment & Plan Note (Signed)
Followed by hematology 

## 2017-10-08 NOTE — Assessment & Plan Note (Signed)
Low carb diet and exercise.  Follow met b and a1c.   

## 2017-10-08 NOTE — Assessment & Plan Note (Signed)
Breathing stable.

## 2017-10-08 NOTE — Assessment & Plan Note (Signed)
Avoid antiinflammatories.  Given recent addition of triam/hctz - recheck metabolic panel.

## 2017-10-08 NOTE — Assessment & Plan Note (Signed)
Followed by cardiology.  Just evaluated.  Added back triam/hctz.  No other changes made.  Follow.

## 2017-10-08 NOTE — Assessment & Plan Note (Signed)
Blood pressure remains elevated.  On triam/hctz.  Check metabolic panel.  Increase amlodipine to bid.  Follow pressures.

## 2017-10-08 NOTE — Assessment & Plan Note (Signed)
Seeing urology and oncology.

## 2017-10-08 NOTE — Assessment & Plan Note (Signed)
On lovastatin.  Follow lipid panel and liver function tests.   

## 2017-10-09 ENCOUNTER — Other Ambulatory Visit (INDEPENDENT_AMBULATORY_CARE_PROVIDER_SITE_OTHER): Payer: Medicare Other

## 2017-10-09 DIAGNOSIS — R7989 Other specified abnormal findings of blood chemistry: Secondary | ICD-10-CM | POA: Diagnosis not present

## 2017-10-09 LAB — URINALYSIS, ROUTINE W REFLEX MICROSCOPIC
Bilirubin Urine: NEGATIVE
HGB URINE DIPSTICK: NEGATIVE
Leukocytes, UA: NEGATIVE
Nitrite: NEGATIVE
PH: 5.5 (ref 5.0–8.0)
Specific Gravity, Urine: 1.025 (ref 1.000–1.030)
TOTAL PROTEIN, URINE-UPE24: 30 — AB
URINE GLUCOSE: NEGATIVE
UROBILINOGEN UA: 0.2 (ref 0.0–1.0)

## 2017-10-09 LAB — BASIC METABOLIC PANEL
BUN: 55 mg/dL — ABNORMAL HIGH (ref 6–23)
CHLORIDE: 99 meq/L (ref 96–112)
CO2: 28 mEq/L (ref 19–32)
Calcium: 9.5 mg/dL (ref 8.4–10.5)
Creatinine, Ser: 2.2 mg/dL — ABNORMAL HIGH (ref 0.40–1.20)
GFR: 22.78 mL/min — AB (ref >60.00)
Glucose, Bld: 115 mg/dL — ABNORMAL HIGH (ref 70–99)
POTASSIUM: 4.1 meq/L (ref 3.5–5.1)
Sodium: 136 mEq/L (ref 135–145)

## 2017-10-10 ENCOUNTER — Other Ambulatory Visit: Payer: Self-pay | Admitting: Internal Medicine

## 2017-10-10 DIAGNOSIS — N184 Chronic kidney disease, stage 4 (severe): Secondary | ICD-10-CM

## 2017-10-10 NOTE — Progress Notes (Signed)
Order placed for f/u met b 

## 2017-10-15 ENCOUNTER — Ambulatory Visit (INDEPENDENT_AMBULATORY_CARE_PROVIDER_SITE_OTHER): Payer: Medicare Other

## 2017-10-15 ENCOUNTER — Other Ambulatory Visit: Payer: Medicare Other

## 2017-10-15 VITALS — BP 180/70 | HR 58 | Temp 98.9°F | Resp 18 | Ht 65.0 in | Wt 139.4 lb

## 2017-10-15 DIAGNOSIS — N184 Chronic kidney disease, stage 4 (severe): Secondary | ICD-10-CM

## 2017-10-15 DIAGNOSIS — Z0000 Encounter for general adult medical examination without abnormal findings: Secondary | ICD-10-CM

## 2017-10-15 NOTE — Progress Notes (Addendum)
Subjective:   Tina [name] is a 83 y.o. female who presents for Medicare Annual (Subsequent) preventive examination.  Review of Systems:  No ROS.  Medicare Wellness Visit. Additional risk factors are reflected in the social history.  Cardiac Risk Factors include: advanced age (>94mn, >>25women);hypertension     Objective:     Vitals: BP (!) 180/70 (BP Location: Left Arm, Patient Position: Sitting, Cuff Size: Normal)   Pulse (!) 58   Temp 98.9 F (37.2 C) (Oral)   Resp 18   Ht 5' 5" (1.651 m)   Wt 139 lb 6.4 oz (63.2 kg)   SpO2 95%   BMI 23.20 kg/m   Body mass index is 23.2 kg/m.  Advanced Directives 10/15/2017 09/19/2017 09/17/2017 09/11/2017 06/26/2017 06/05/2017 05/29/2017  Does Patient Have a Medical Advance Directive? _0  Yes Yes  Type of Advance Directive Living will;Healthcare Power of [place]ear Creek VillageLiving will [place] will [place]un[place]akaiLiving will HPenn WynneLiving will Living will -  Does patient want to make changes to medical advance directive? No - Patient declined No - Patient declined No - Patient declined No - Patient declined - No - Patient declined -  Copy of [place] Chart? No - copy requested No - copy requested No - copy requested No - copy requested No - copy requested - -  Would patient like information on creating a medical advance directive? - - - - - - -    Tobacco Social History   Tobacco Use  Smoking Status Former Smoker  . Packs/day: 1.00  . Years: 45.00  . Pack years: 45.00  . Types: Cigarettes  . Last attempt to quit: 11/11/2003  . Years since quitting: 13.9  Smokeless Tobacco Never Used     Counseling given: Not Answered   Clinical Intake:  Pre-visit preparation completed: Yes  Pain : No/denies pain     Nutritional Status: BMI of 19-24  Normal Diabetes: No  How often do you need to have someone help you when  you read instructions, pamphlets, or other written materials from your doctor or pharmacy?: 1 - Never  Interpreter Needed?: No     Past Medical History:  Diagnosis Date  . Anemia   . Asthma   . Bladder cancer ([place]   . BRCA positive   . CAD (coronary artery disease)   . COPD (chronic obstructive pulmonary disease) ([place]ughest[place]   . Emphysema lung ([place]   . GERD (gastroesophageal reflux disease)   . History of colon polyps   . Hypercholesterolemia   . Hyperglycemia   . Hypertension   . Lung nodules   . Osteoporosis   . Personal history of tobacco use, presenting hazards to health 11/25/2014  . Polycythemia vera(238.4)   . Renal cyst   . Skin cancer    Past Surgical History:  Procedure Laterality Date  . ANGIOPLASTY     coronary (x1)  . COLONOSCOPY WITH PROPOFOL N/A 03/02/2015   Procedure: COLONOSCOPY WITH PROPOFOL;  Surgeon: MLollie Sails MD;  [facility];  Service: Endoscopy;  Laterality: N/A;  . COLONOSCOPY WITH PROPOFOL N/A 02/13/2017   Procedure: COLONOSCOPY WITH PROPOFOL;  Surgeon: WLucilla Lame MD;  [facility];  Service: Endoscopy;  Laterality: N/A;  . CORONARY ANGIOPLASTY    . CORONARY ARTERY BYPASS GRAFT  09/17/2017   pt denies  . CYSTOSCOPY W/ RETROGRADES Bilateral 02/13/2017   Procedure: CYSTOSCOPY WITH RETROGRADE  PYELOGRAM;  Surgeon: Hollice Espy, MD;  Location: [facility] ORS;  Service: Urology;  Laterality: Bilateral;  . CYSTOSCOPY W/ RETROGRADES Bilateral 09/19/2017   Procedure: CYSTOSCOPY WITH RETROGRADE PYELOGRAM;  Surgeon: Hollice Espy, MD;  Location: [facility] ORS;  Service: Urology;  Laterality: Bilateral;  . CYSTOSCOPY WITH BIOPSY  03/26/2017   Procedure: CYSTOSCOPY WITH BIOPSY;  Surgeon: Hollice Espy, MD;  Location: [facility] ORS;  Service: Urology;;  . Consuela Mimes WITH STENT PLACEMENT Bilateral 02/13/2017   Procedure: CYSTOSCOPY WITH STENT PLACEMENT;  Surgeon: Hollice Espy, MD;  Location: [facility] ORS;  Service: Urology;  Laterality:  Bilateral;  . CYSTOSCOPY WITH STENT PLACEMENT Bilateral 03/26/2017   Procedure: CYSTOSCOPY WITH STENT EXCHANGE;  Surgeon: Hollice Espy, MD;  Location: [facility] ORS;  Service: Urology;  Laterality: Bilateral;  . ESOPHAGOGASTRODUODENOSCOPY (EGD) WITH PROPOFOL N/A 02/13/2017   Procedure: ESOPHAGOGASTRODUODENOSCOPY (EGD) WITH PROPOFOL;  Surgeon: [name], MD;  [facility];  Service: Endoscopy;  Laterality: N/A;  . PORTA CATH INSERTION N/A 02/28/2017   Procedure: PORTA CATH INSERTION;  Surgeon: Algernon Huxley, MD;  Location: [place] CV LAB;  Service: Cardiovascular;  Laterality: N/A;  . SALPINGOOPHORECTOMY    . TONSILECTOMY/ADENOIDECTOMY WITH MYRINGOTOMY    . TONSILLECTOMY    . TRANSURETHRAL RESECTION OF BLADDER TUMOR N/A 02/13/2017   Procedure: TRANSURETHRAL RESECTION OF BLADDER TUMOR (TURBT);  Surgeon: Hollice Espy, MD;  Location: [facility] ORS;  Service: Urology;  Laterality: N/A;  . TUBAL LIGATION    . UPPER GI ENDOSCOPY  02/13/2017  . URETEROSCOPY Left 09/19/2017   Procedure: URETEROSCOPY;  Surgeon: Hollice Espy, MD;  Location: [facility] ORS;  Service: Urology;  Laterality: Left;  Marland Kitchen VISCERAL ARTERY INTERVENTION N/A 02/12/2017   Procedure: VISCERAL ARTERY INTERVENTION;  Surgeon: Algernon Huxley, MD;  Location: [place] CV LAB;  Service: Cardiovascular;  Laterality: N/A;   Family History  Problem Relation Age of Onset  . Cirrhosis Father        died age 20  . Alcohol abuse Father   . Asthma Mother   . Congestive Heart Failure Mother   . Breast cancer Mother        2 (1/2 sisters)  . Osteoarthritis Mother   . Colon cancer Mother   . Lupus Sister   . Alcohol abuse Sister   . Ovarian cancer Sister   . Osteoporosis Sister   . Skin cancer Sister   . Breast cancer Cousin   . Breast cancer Maternal Aunt    Social History   Socioeconomic History  . Marital status: Widowed    Spouse name: Not on file  . Number of children: 2  . Years of education: Not on file  . Highest  education level: Not on file  Occupational History  . Not on file  Social Needs  . Financial resource strain: Not hard at all  . Food insecurity:    Worry: Never true    Inability: Never true  . Transportation needs:    Medical: No    Non-medical: No  Tobacco Use  . Smoking status: Former Smoker    Packs/day: 1.00    Years: 45.00    Pack years: 45.00    Types: Cigarettes    Last attempt to quit: 11/11/2003    Years since quitting: 13.9  . Smokeless tobacco: Never Used  Substance and Sexual Activity  . Alcohol use: No    Alcohol/week: 0.0 oz    Comment: occasional  . Drug use: No  . Sexual activity: Never  Lifestyle  . Physical  activity:    Days per week: 2 days    Minutes per session: 90 min  . Stress: Not at all  Relationships  . Social connections:    Talks on phone: Not on file    Gets together: Not on file    Attends religious service: Not on file    Active member of club or organization: Not on file    Attends meetings of clubs or organizations: Not on file    Relationship status: Not on file  Other Topics Concern  . Not on file  Social History Narrative  . Not on file    Outpatient Encounter Medications as of 10/15/2017  Medication Sig  . albuterol (PROVENTIL HFA;VENTOLIN HFA) 108 (90 Base) MCG/ACT inhaler Inhale 2 puffs into the lungs every 6 (six) hours as needed for wheezing or shortness of breath.  Marland Kitchen amLODipine (NORVASC) 5 MG tablet Take one tablet bid  . CALCIUM CARBONATE PO Take 1 tablet by mouth every Monday, Wednesday, and Friday.  . cholecalciferol (VITAMIN D) 1000 units tablet Take 1,000 Units by mouth daily.  Marland Kitchen ELIQUIS 5 MG TABS tablet TAKE 1 TABLET BY MOUTH TWO  TIMES DAILY  . fluticasone (FLONASE) 50 MCG/ACT nasal spray Place 2 sprays into both nostrils daily as needed for allergies.  Marland Kitchen ipratropium-albuterol (DUONEB) 0.5-2.5 (3) MG/3ML SOLN Take 3 mLs by nebulization every 6 (six) hours as needed (for shortness of breath).  . losartan (COZAAR) 100  MG tablet TAKE 1 TABLET BY MOUTH  DAILY (Patient taking differently: TAKE 182m BY MOUTH  DAILY)  . lovastatin (MEVACOR) 40 MG tablet TAKE 1 TABLET BY MOUTH  DAILY (Patient taking differently: TAKE 1 TABLET BY MOUTH  DAILY (evening))  . metoprolol tartrate (LOPRESSOR) 25 MG tablet Take 1 tablet (25 mg total) by mouth 2 (two) times daily.  .Marland KitchenMYRBETRIQ 25 MG TB24 tablet Take 1 tablet by mouth daily.  . potassium chloride (K-DUR,KLOR-CON) 10 MEQ tablet Take 1 tablet (10 mEq total) by mouth daily. (Patient not taking: Reported on 10/15/2017)  . [DISCONTINUED] triamterene-hydrochlorothiazide (DYAZIDE) 37.5-25 MG capsule Take 1 capsule by mouth daily.   No facility-administered encounter medications on file as of 10/15/2017.     Activities of Daily Living In your present state of health, do you have any difficulty performing the following activities: 10/15/2017 09/19/2017  Hearing? N N  Vision? N N  Difficulty concentrating or making decisions? N N  Walking or climbing stairs? N N  Dressing or bathing? N N  Doing errands, shopping? N -  Preparing Food and eating ? N -  Using the Toilet? N -  In the past six months, have you accidently leaked urine? N -  Do you have problems with loss of bowel control? N -  Managing your Medications? N -  Managing your Finances? N -  Housekeeping or managing your Housekeeping? N -  Some recent data might be hidden    Patient Care Team: SEinar Pheasant MD as PCP - General (Internal Medicine) SClent Jacks RN as Registered Nurse    Assessment:   This is a routine wellness examination for JHoang  The goal of the wellness visit is to assist the patient how to close the gaps in care and create a preventative care plan for the patient.   The roster of all physicians providing medical care to patient is listed in the Snapshot section of the chart.  Taking calcium VIT D as appropriate/Osteoporosis risk reviewed.    Safety issues reviewed;  Smoke and carbon  monoxide detectors in the home. No firearms in the home. Wears seatbelts when driving or riding with others. No violence in the home.  They do not have excessive sun exposure.  Discussed the need for sun protection: hats, long sleeves and the use of sunscreen if there is significant sun exposure.  Patient is alert, normal appearance, oriented to person/place/and time.  Correctly identified the president of the [place] and recalls of 3/3 words. Performs simple calculations and can read correct time from watch face.  Displays appropriate judgement.  No new identified risk were noted.  No failures at ADL's or IADL's.    BMI- discussed the importance of a healthy diet, water intake and the benefits of aerobic exercise. Educational material provided.   24 hour diet recall:  Dental- UTD  Eye- Visual acuity not assessed per patient preference since they have regular follow up with the ophthalmologist.  Wears corrective lenses.  Sleep patterns- Sleeps 6 hours at night.    TDAP vaccine deferred per patient preference.  Follow up with insurance.  Educational material provided.  Labs completed today.   HTN- followed by pcp.   Minimal fluid retention in legs. No pitting edema. Her days are usually spent dancing for 1.5 hours and running errands. She plans to continue to monitor and keep them propped up more often.   Exercise Activities and Dietary recommendations Current Exercise Habits: Structured exercise class, Time (Minutes): > 60, Frequency (Times/Week): 3, Weekly Exercise (Minutes/Week): 0, Intensity: Moderate  Goals    . Maintain Healthy Lifestyle       Fall Risk Fall Risk  10/15/2017 06/05/2017 04/19/2017 04/17/2017 03/08/2017  Falls in the past year? _0   Comment - - - - -   Depression Screen PHQ 2/9 Scores 10/15/2017 06/05/2017 04/13/2016 03/07/2016  PHQ - 2 Score 0 0 0 0     Cognitive Function     6CIT Screen 10/15/2017 03/29/2016  What Year? 0 points 0 points    What month? 0 points 0 points  What time? 0 points 0 points  Count back from 20 0 points 0 points  Months in reverse 0 points 0 points  Repeat phrase 0 points -  Total Score 0 -    Immunization History  Administered Date(s) Administered  . Influenza Split 02/03/2013, 01/16/2014  . Influenza, High Dose Seasonal PF 02/02/2016  . Influenza,inj,Quad PF,6+ Mos 01/19/2015  . Influenza-Unspecified 01/21/2009, 01/10/2010, 11/14/2010, 01/22/2012, 01/16/2013, 01/16/2014, 01/19/2015, 02/02/2016, 02/20/2017  . Pneumococcal Conjugate-13 02/05/2014  . Pneumococcal Polysaccharide-23 03/29/2016  . Pneumococcal-Unspecified 06/15/1998, 04/17/2005  . Zoster 08/06/2014   Screening Tests Health Maintenance  Topic Date Due  . TETANUS/TDAP  12/06/1953  . MAMMOGRAM  10/17/2016  . INFLUENZA VACCINE  12/13/2017  . COLONOSCOPY  02/13/2022  . DEXA SCAN  Completed  . PNA vac Low Risk Adult  Completed      Plan:    End of life planning; Advance aging; Advanced directives discussed. Copy of current HCPOA/Living Will requested.    I have personally reviewed and noted the following in the patient's chart:   . Medical and social history . Use of alcohol, tobacco or illicit drugs  . Current medications and supplements . Functional ability and status . Nutritional status . Physical activity . Advanced directives . List of other physicians . Hospitalizations, surgeries, and ER visits in previous 12 months . Vitals . Screenings to include cognitive, depression, and falls . Referrals and appointments  In addition, I have  reviewed and discussed with patient certain preventive protocols, quality metrics, and best practice recommendations. A written personalized care plan for preventive services as well as general preventive health recommendations were provided to patient.     Varney Biles, LPN  0/12/221   Reviewed above information.  Will need f/u regarding her blood pressure.  Will  confirm no acute issues with legs.  Message sent.    Dr Nicki Reaper

## 2017-10-15 NOTE — Patient Instructions (Addendum)
  Tina [name] , Thank you for taking time to come for your Medicare Wellness Visit. I appreciate your ongoing commitment to your health goals. Please review the following plan we discussed and let me know if I can assist you in the future.   Follow up as needed.    Bring a copy of your [place] and/or Living Will to be scanned into chart.  Have a great day!  These are the goals we discussed: Goals    . Maintain Healthy Lifestyle       This is a list of the screening recommended for you and due dates:  Health Maintenance  Topic Date Due  . Tetanus Vaccine  12/06/1953  . Mammogram  10/17/2016  . Flu Shot  12/13/2017  . Colon Cancer Screening  02/13/2022  . DEXA scan (bone density measurement)  Completed  . Pneumonia vaccines  Completed

## 2017-10-16 ENCOUNTER — Telehealth: Payer: Self-pay | Admitting: Internal Medicine

## 2017-10-16 LAB — BASIC METABOLIC PANEL
BUN: 24 mg/dL — ABNORMAL HIGH (ref 6–23)
CALCIUM: 9.7 mg/dL (ref 8.4–10.5)
CO2: 28 mEq/L (ref 19–32)
Chloride: 104 mEq/L (ref 96–112)
Creatinine, Ser: 1.19 mg/dL (ref 0.40–1.20)
GFR: 46.29 mL/min — AB (ref >60.00)
Glucose, Bld: 120 mg/dL — ABNORMAL HIGH (ref 70–99)
POTASSIUM: 3.8 meq/L (ref 3.5–5.1)
Sodium: 141 mEq/L (ref 135–145)

## 2017-10-16 NOTE — Telephone Encounter (Signed)
Faxed clarification form. Confirmed that they have received. Patient is aware.

## 2017-10-16 NOTE — Telephone Encounter (Signed)
Copied from [place] [phone]. Topic: Quick Communication - See Telephone Encounter >> Oct 16, 2017  8:32 AM Ether Griffins B wrote: CRM for notification. See Telephone encounter for: 10/16/17. Pt calling in stating [facility] is requesting the provider call and answer some questions.  They didn't disclose the questions to the patient. They wont fill the medication until the provider calls. The medication is amLODipine (NORVASC) 5 MG tablet [facility] contact [phone].

## 2017-10-16 NOTE — Telephone Encounter (Signed)
Please advise 

## 2017-10-16 NOTE — Telephone Encounter (Signed)
Yes, that is what clarification was for and pharmacist stated that they would get it ready for her and send

## 2017-10-16 NOTE — Telephone Encounter (Signed)
Was it a clarification on 5mg  bid?  If so, are they going to send to her.

## 2017-10-20 ENCOUNTER — Other Ambulatory Visit: Payer: Self-pay

## 2017-10-20 ENCOUNTER — Emergency Department: Payer: Medicare Other

## 2017-10-20 ENCOUNTER — Inpatient Hospital Stay
Admission: EM | Admit: 2017-10-20 | Discharge: 2017-10-22 | DRG: 291 | Disposition: A | Discharge status: Home / Self Care | Payer: Medicare Other | Attending: Internal Medicine | Admitting: Internal Medicine

## 2017-10-20 ENCOUNTER — Encounter: Payer: Self-pay | Admitting: Emergency Medicine

## 2017-10-20 ENCOUNTER — Inpatient Hospital Stay
Admit: 2017-10-20 | Discharge: 2017-10-20 | Disposition: A | Discharge status: Home / Self Care | Payer: Medicare Other | Attending: Internal Medicine | Admitting: Internal Medicine

## 2017-10-20 DIAGNOSIS — J449 Chronic obstructive pulmonary disease, unspecified: Secondary | ICD-10-CM

## 2017-10-20 DIAGNOSIS — Z8551 Personal history of malignant neoplasm of bladder: Secondary | ICD-10-CM | POA: Diagnosis not present

## 2017-10-20 DIAGNOSIS — N183 Chronic kidney disease, stage 3 unspecified: Secondary | ICD-10-CM | POA: Diagnosis present

## 2017-10-20 DIAGNOSIS — Z8262 Family history of osteoporosis: Secondary | ICD-10-CM

## 2017-10-20 DIAGNOSIS — E785 Hyperlipidemia, unspecified: Secondary | ICD-10-CM | POA: Diagnosis present

## 2017-10-20 DIAGNOSIS — J9601 Acute respiratory failure with hypoxia: Secondary | ICD-10-CM | POA: Diagnosis present

## 2017-10-20 DIAGNOSIS — I130 Hypertensive heart and chronic kidney disease with heart failure and stage 1 through stage 4 chronic kidney disease, or unspecified chronic kidney disease: Principal | ICD-10-CM | POA: Diagnosis present

## 2017-10-20 DIAGNOSIS — I2510 Atherosclerotic heart disease of native coronary artery without angina pectoris: Secondary | ICD-10-CM | POA: Diagnosis present

## 2017-10-20 DIAGNOSIS — Z832 Family history of diseases of the blood and blood-forming organs and certain disorders involving the immune mechanism: Secondary | ICD-10-CM | POA: Diagnosis not present

## 2017-10-20 DIAGNOSIS — C679 Malignant neoplasm of bladder, unspecified: Secondary | ICD-10-CM | POA: Diagnosis present

## 2017-10-20 DIAGNOSIS — Z66 Do not resuscitate: Secondary | ICD-10-CM | POA: Diagnosis present

## 2017-10-20 DIAGNOSIS — I5033 Acute on chronic diastolic (congestive) heart failure: Secondary | ICD-10-CM | POA: Diagnosis present

## 2017-10-20 DIAGNOSIS — Z923 Personal history of irradiation: Secondary | ICD-10-CM

## 2017-10-20 DIAGNOSIS — Z9221 Personal history of antineoplastic chemotherapy: Secondary | ICD-10-CM | POA: Diagnosis not present

## 2017-10-20 DIAGNOSIS — Z955 Presence of coronary angioplasty implant and graft: Secondary | ICD-10-CM | POA: Diagnosis not present

## 2017-10-20 DIAGNOSIS — M810 Age-related osteoporosis without current pathological fracture: Secondary | ICD-10-CM | POA: Diagnosis present

## 2017-10-20 DIAGNOSIS — Z1501 Genetic susceptibility to malignant neoplasm of breast: Secondary | ICD-10-CM

## 2017-10-20 DIAGNOSIS — Z87891 Personal history of nicotine dependence: Secondary | ICD-10-CM

## 2017-10-20 DIAGNOSIS — Z803 Family history of malignant neoplasm of breast: Secondary | ICD-10-CM | POA: Diagnosis not present

## 2017-10-20 DIAGNOSIS — Z7901 Long term (current) use of anticoagulants: Secondary | ICD-10-CM

## 2017-10-20 DIAGNOSIS — R0603 Acute respiratory distress: Secondary | ICD-10-CM

## 2017-10-20 DIAGNOSIS — Z8249 Family history of ischemic heart disease and other diseases of the circulatory system: Secondary | ICD-10-CM

## 2017-10-20 DIAGNOSIS — E869 Volume depletion, unspecified: Secondary | ICD-10-CM | POA: Diagnosis present

## 2017-10-20 DIAGNOSIS — Z8041 Family history of malignant neoplasm of ovary: Secondary | ICD-10-CM | POA: Diagnosis not present

## 2017-10-20 DIAGNOSIS — Z825 Family history of asthma and other chronic lower respiratory diseases: Secondary | ICD-10-CM | POA: Diagnosis not present

## 2017-10-20 DIAGNOSIS — R0602 Shortness of breath: Secondary | ICD-10-CM | POA: Diagnosis not present

## 2017-10-20 DIAGNOSIS — J441 Chronic obstructive pulmonary disease with (acute) exacerbation: Secondary | ICD-10-CM | POA: Diagnosis present

## 2017-10-20 DIAGNOSIS — Z951 Presence of aortocoronary bypass graft: Secondary | ICD-10-CM | POA: Diagnosis not present

## 2017-10-20 DIAGNOSIS — I4891 Unspecified atrial fibrillation: Secondary | ICD-10-CM | POA: Diagnosis not present

## 2017-10-20 DIAGNOSIS — I509 Heart failure, unspecified: Secondary | ICD-10-CM | POA: Diagnosis not present

## 2017-10-20 DIAGNOSIS — Z800 Family history of malignant neoplasm of digestive organs: Secondary | ICD-10-CM

## 2017-10-20 DIAGNOSIS — R05 Cough: Secondary | ICD-10-CM | POA: Diagnosis not present

## 2017-10-20 DIAGNOSIS — I110 Hypertensive heart disease with heart failure: Secondary | ICD-10-CM | POA: Diagnosis not present

## 2017-10-20 DIAGNOSIS — I480 Paroxysmal atrial fibrillation: Secondary | ICD-10-CM | POA: Diagnosis present

## 2017-10-20 LAB — CBC WITH DIFFERENTIAL/PLATELET
Basophils Absolute: 0 10*3/uL (ref 0–0.1)
Basophils Relative: 0 %
EOS ABS: 0.2 10*3/uL (ref 0–0.7)
EOS PCT: 2 %
HCT: 37.8 % (ref 35.0–47.0)
Hemoglobin: 13 g/dL (ref 12.0–16.0)
LYMPHS ABS: 1 10*3/uL (ref 1.0–3.6)
Lymphocytes Relative: 8 %
MCH: 31.7 pg (ref 26.0–34.0)
MCHC: 34.5 g/dL (ref 32.0–36.0)
MCV: 91.9 fL (ref 80.0–100.0)
MONOS PCT: 12 %
Monocytes Absolute: 1.5 10*3/uL — ABNORMAL HIGH (ref 0.2–0.9)
Neutro Abs: 10 10*3/uL — ABNORMAL HIGH (ref 1.4–6.5)
Neutrophils Relative %: 78 %
PLATELETS: 181 10*3/uL (ref 150–440)
RBC: 4.12 MIL/uL (ref 3.80–5.20)
RDW: 13.6 % (ref 11.5–14.5)
WBC: 12.7 10*3/uL — ABNORMAL HIGH (ref 3.6–11.0)

## 2017-10-20 LAB — BRAIN NATRIURETIC PEPTIDE: B NATRIURETIC PEPTIDE 5: 517 pg/mL — AB (ref 0.0–100.0)

## 2017-10-20 LAB — COMPREHENSIVE METABOLIC PANEL
ALBUMIN: 3.5 g/dL (ref 3.5–5.0)
ALK PHOS: 55 U/L (ref 38–126)
ALT: 18 U/L (ref 14–54)
ANION GAP: 10 (ref 5–15)
AST: 23 U/L (ref 15–41)
BILIRUBIN TOTAL: 0.8 mg/dL (ref 0.3–1.2)
BUN: 22 mg/dL — AB (ref 6–20)
CALCIUM: 8.8 mg/dL — AB (ref 8.9–10.3)
CO2: 23 mmol/L (ref 22–32)
Chloride: 107 mmol/L (ref 101–111)
Creatinine, Ser: 1.07 mg/dL — ABNORMAL HIGH (ref 0.44–1.00)
GFR calc Af Amer: 55 mL/min — ABNORMAL LOW (ref >60)
GFR, EST NON AFRICAN AMERICAN: 48 mL/min — AB (ref >60)
GLUCOSE: 152 mg/dL — AB (ref 65–99)
Potassium: 3.4 mmol/L — ABNORMAL LOW (ref 3.5–5.1)
Sodium: 140 mmol/L (ref 135–145)
Total Protein: 6.8 g/dL (ref 6.5–8.1)

## 2017-10-20 LAB — TROPONIN I
Troponin I: <0.03 ng/mL (ref <0.03)
Troponin I: <0.03 ng/mL (ref <0.03)

## 2017-10-20 LAB — GLUCOSE, CAPILLARY: GLUCOSE-CAPILLARY: 257 mg/dL — AB (ref 65–99)

## 2017-10-20 MED ORDER — FUROSEMIDE 10 MG/ML IJ SOLN
20.0000 mg | Freq: Once | INTRAMUSCULAR | Status: AC
Start: 2017-10-20 — End: 2017-10-20
  Administered 2017-10-20: 20 mg via INTRAVENOUS

## 2017-10-20 MED ORDER — ONDANSETRON HCL 4 MG PO TABS: 4.0000 mg | ORAL_TABLET | Freq: Four times a day (QID) | ORAL | Status: DC | PRN

## 2017-10-20 MED ORDER — SODIUM CHLORIDE 0.9 % IV SOLN: 250.0000 mL | INTRAVENOUS | Status: DC | PRN

## 2017-10-20 MED ORDER — DOCUSATE SODIUM 100 MG PO CAPS
100.0000 mg | ORAL_CAPSULE | Freq: Two times a day (BID) | ORAL | Status: DC
  Administered 2017-10-20 – 2017-10-22 (×4): 100 mg via ORAL
  Filled 2017-10-20 (×4): qty 1

## 2017-10-20 MED ORDER — ONDANSETRON HCL 4 MG/2ML IJ SOLN: 4.0000 mg | Freq: Four times a day (QID) | INTRAMUSCULAR | Status: DC | PRN

## 2017-10-20 MED ORDER — IPRATROPIUM-ALBUTEROL 0.5-2.5 (3) MG/3ML IN SOLN
3.0000 mL | Freq: Four times a day (QID) | RESPIRATORY_TRACT | Status: DC
  Administered 2017-10-20 – 2017-10-21 (×3): 3 mL via RESPIRATORY_TRACT
  Filled 2017-10-20 (×3): qty 3

## 2017-10-20 MED ORDER — IOPAMIDOL (ISOVUE-370) INJECTION 76%
75.0000 mL | Freq: Once | INTRAVENOUS | Status: AC | PRN
  Administered 2017-10-20: 75 mL via INTRAVENOUS

## 2017-10-20 MED ORDER — METOPROLOL TARTRATE 25 MG PO TABS
25.0000 mg | ORAL_TABLET | Freq: Two times a day (BID) | ORAL | Status: DC
  Administered 2017-10-20 – 2017-10-22 (×4): 25 mg via ORAL
  Filled 2017-10-20 (×4): qty 1

## 2017-10-20 MED ORDER — IPRATROPIUM-ALBUTEROL 0.5-2.5 (3) MG/3ML IN SOLN
3.0000 mL | Freq: Once | RESPIRATORY_TRACT | Status: AC
  Administered 2017-10-20: 3 mL via RESPIRATORY_TRACT
  Filled 2017-10-20: qty 3

## 2017-10-20 MED ORDER — METHYLPREDNISOLONE SODIUM SUCC 125 MG IJ SOLR
125.0000 mg | Freq: Once | INTRAMUSCULAR | Status: AC
  Administered 2017-10-20: 125 mg via INTRAVENOUS
  Filled 2017-10-20: qty 2

## 2017-10-20 MED ORDER — MIRABEGRON ER 25 MG PO TB24
25.0000 mg | ORAL_TABLET | Freq: Every day | ORAL | Status: DC
  Administered 2017-10-21 – 2017-10-22 (×2): 25 mg via ORAL
  Filled 2017-10-20 (×3): qty 1

## 2017-10-20 MED ORDER — NITROGLYCERIN 2 % TD OINT
1.0000 [in_us] | TOPICAL_OINTMENT | Freq: Four times a day (QID) | TRANSDERMAL | Status: DC
  Administered 2017-10-20 – 2017-10-22 (×7): 1 [in_us] via TOPICAL
  Filled 2017-10-20 (×7): qty 1

## 2017-10-20 MED ORDER — VITAMIN D3 25 MCG (1000 UNIT) PO TABS
1000.0000 [IU] | ORAL_TABLET | Freq: Every day | ORAL | Status: DC
  Administered 2017-10-21 – 2017-10-22 (×2): 1000 [IU] via ORAL
  Filled 2017-10-20 (×3): qty 1

## 2017-10-20 MED ORDER — NITROGLYCERIN 2 % TD OINT
1.0000 [in_us] | TOPICAL_OINTMENT | Freq: Once | TRANSDERMAL | Status: AC
  Administered 2017-10-20: 1 [in_us] via TOPICAL

## 2017-10-20 MED ORDER — FUROSEMIDE 10 MG/ML IJ SOLN
40.0000 mg | Freq: Two times a day (BID) | INTRAMUSCULAR | Status: DC
  Administered 2017-10-20 – 2017-10-21 (×2): 40 mg via INTRAVENOUS
  Filled 2017-10-20 (×2): qty 4

## 2017-10-20 MED ORDER — PRAVASTATIN SODIUM 40 MG PO TABS
40.0000 mg | ORAL_TABLET | Freq: Every day | ORAL | Status: DC
  Administered 2017-10-20 – 2017-10-21 (×2): 40 mg via ORAL
  Filled 2017-10-20 (×2): qty 1

## 2017-10-20 MED ORDER — POTASSIUM CHLORIDE CRYS ER 20 MEQ PO TBCR
20.0000 meq | EXTENDED_RELEASE_TABLET | Freq: Two times a day (BID) | ORAL | Status: DC
  Administered 2017-10-20 – 2017-10-22 (×4): 20 meq via ORAL
  Filled 2017-10-20 (×5): qty 1

## 2017-10-20 MED ORDER — ACETAMINOPHEN 325 MG PO TABS: 650.0000 mg | ORAL_TABLET | Freq: Four times a day (QID) | ORAL | Status: DC | PRN

## 2017-10-20 MED ORDER — SODIUM CHLORIDE 0.9% FLUSH
3.0000 mL | Freq: Two times a day (BID) | INTRAVENOUS | Status: DC
  Administered 2017-10-20 – 2017-10-21 (×2): 3 mL via INTRAVENOUS

## 2017-10-20 MED ORDER — FUROSEMIDE 10 MG/ML IJ SOLN
INTRAMUSCULAR | Status: AC
  Administered 2017-10-20: 20 mg via INTRAVENOUS
  Filled 2017-10-20: qty 4

## 2017-10-20 MED ORDER — SODIUM CHLORIDE 0.9% FLUSH: 3.0000 mL | INTRAVENOUS | Status: DC | PRN

## 2017-10-20 MED ORDER — LOSARTAN POTASSIUM 50 MG PO TABS
100.0000 mg | ORAL_TABLET | Freq: Every day | ORAL | Status: DC
  Administered 2017-10-21 – 2017-10-22 (×2): 100 mg via ORAL
  Filled 2017-10-20 (×3): qty 2

## 2017-10-20 MED ORDER — MORPHINE SULFATE (PF) 2 MG/ML IV SOLN: 2.0000 mg | INTRAVENOUS | Status: DC | PRN

## 2017-10-20 MED ORDER — INSULIN ASPART 100 UNIT/ML ~~LOC~~ SOLN
0.0000 [IU] | Freq: Three times a day (TID) | SUBCUTANEOUS | Status: DC
  Administered 2017-10-21 (×3): 2 [IU] via SUBCUTANEOUS
  Administered 2017-10-22: 3 [IU] via SUBCUTANEOUS
  Filled 2017-10-20 (×4): qty 1

## 2017-10-20 MED ORDER — ALBUTEROL SULFATE (2.5 MG/3ML) 0.083% IN NEBU
2.5000 mg | INHALATION_SOLUTION | Freq: Four times a day (QID) | RESPIRATORY_TRACT | Status: DC | PRN
  Administered 2017-10-21 (×2): 2.5 mg via RESPIRATORY_TRACT
  Filled 2017-10-20 (×2): qty 3

## 2017-10-20 MED ORDER — NITROGLYCERIN 2 % TD OINT
TOPICAL_OINTMENT | TRANSDERMAL | Status: AC
  Administered 2017-10-20: 1 [in_us] via TOPICAL
  Filled 2017-10-20: qty 1

## 2017-10-20 MED ORDER — ACETAMINOPHEN 650 MG RE SUPP: 650.0000 mg | Freq: Four times a day (QID) | RECTAL | Status: DC | PRN

## 2017-10-20 MED ORDER — AMLODIPINE BESYLATE 5 MG PO TABS
5.0000 mg | ORAL_TABLET | Freq: Two times a day (BID) | ORAL | Status: DC
  Administered 2017-10-20 – 2017-10-22 (×4): 5 mg via ORAL
  Filled 2017-10-20 (×4): qty 1

## 2017-10-20 MED ORDER — APIXABAN 5 MG PO TABS
5.0000 mg | ORAL_TABLET | Freq: Two times a day (BID) | ORAL | Status: DC
  Administered 2017-10-20 – 2017-10-22 (×4): 5 mg via ORAL
  Filled 2017-10-20 (×4): qty 1

## 2017-10-20 MED ORDER — BISACODYL 10 MG RE SUPP: 10.0000 mg | Freq: Every day | RECTAL | Status: DC | PRN

## 2017-10-20 MED ORDER — VITAMIN D3 25 MCG (1000 UNIT) PO TABS
1000.0000 [IU] | ORAL_TABLET | Freq: Every day | ORAL | Status: DC
  Filled 2017-10-20: qty 1

## 2017-10-20 MED ORDER — FLUTICASONE PROPIONATE 50 MCG/ACT NA SUSP
2.0000 | Freq: Every day | NASAL | Status: DC | PRN
  Filled 2017-10-20: qty 16

## 2017-10-20 NOTE — Progress Notes (Signed)
Pt refuses bed alarm. "if I can line dance all night, I can make it to the bathroom."

## 2017-10-20 NOTE — H&P (Signed)
History and Physical    Tina [name] NOI:[id number] DOB: 04-27-1935 DOA: 10/20/2017  Referring physician: Dr. Mable Paris PCP: Einar Pheasant, MD  Specialists: Dr. Saralyn Pilar  Chief Complaint: SOB and cough with LE edema  HPI: Tina [name] is a 83 y.o. female has a past medical history significant for CAD s/p stent, bladder cancer, and COPD now with 3-4 day hx of worsening SOB with cough and LE edema. No fever. Denies CP or palpitations. In ER, pt noted to be relatively hypoxic with CHF on CXR. BNP elevated. No hx of CHF. She is now admitted. BP extremely elevated in ER. No N/V/D.  Review of Systems: The patient denies anorexia, fever, weight loss,, vision loss, decreased hearing, hoarseness, chest pain, syncope,  balance deficits, hemoptysis, abdominal pain, melena, hematochezia, severe indigestion/heartburn, hematuria, incontinence, genital sores, muscle weakness, suspicious skin lesions, transient blindness, difficulty walking, depression, unusual weight change, abnormal bleeding, enlarged lymph nodes, angioedema, and breast masses.   Past Medical History:  Diagnosis Date  . Anemia   . Asthma   . Bladder cancer ([place])   . BRCA positive   . CAD (coronary artery disease)   . COPD (chronic obstructive pulmonary disease) ([place])   . Emphysema lung ([place])   . GERD (gastroesophageal reflux disease)   . History of colon polyps   . Hypercholesterolemia   . Hyperglycemia   . Hypertension   . Lung nodules   . Osteoporosis   . Personal history of tobacco use, presenting hazards to health 11/25/2014  . Polycythemia vera(238.4)   . Renal cyst   . Skin cancer    Past Surgical History:  Procedure Laterality Date  . ANGIOPLASTY     coronary (x1)  . COLONOSCOPY WITH PROPOFOL N/A 03/02/2015   Procedure: COLONOSCOPY WITH PROPOFOL;  Surgeon: Lollie Sails, MD;  Location: [facility] ENDOSCOPY;  Service: Endoscopy;  Laterality: N/A;  . COLONOSCOPY WITH PROPOFOL N/A 02/13/2017   Procedure: COLONOSCOPY  WITH PROPOFOL;  Surgeon: [name], MD;  Location[facility];  Service: Endoscopy;  Laterality: N/A;  . CORONARY ANGIOPLASTY    . CORONARY ARTERY BYPASS GRAFT  09/17/2017   pt denies  . CYSTOSCOPY W/ RETROGRADES Bilateral 02/13/2017   Procedure: CYSTOSCOPY WITH RETROGRADE PYELOGRAM;  Surgeon: Hollice Espy, MD;  Location: [facility] ORS;  Service: Urology;  Laterality: Bilateral;  . CYSTOSCOPY W/ RETROGRADES Bilateral 09/19/2017   Procedure: CYSTOSCOPY WITH RETROGRADE PYELOGRAM;  Surgeon: Hollice Espy, MD;  Location: [facility] ORS;  Service: Urology;  Laterality: Bilateral;  . CYSTOSCOPY WITH BIOPSY  03/26/2017   Procedure: CYSTOSCOPY WITH BIOPSY;  Surgeon: Hollice Espy, MD;  Location: [facility] ORS;  Service: Urology;;  . Consuela Mimes WITH STENT PLACEMENT Bilateral 02/13/2017   Procedure: CYSTOSCOPY WITH STENT PLACEMENT;  Surgeon: Hollice Espy, MD;  Location: [facility] ORS;  Service: Urology;  Laterality: Bilateral;  . CYSTOSCOPY WITH STENT PLACEMENT Bilateral 03/26/2017   Procedure: CYSTOSCOPY WITH STENT EXCHANGE;  Surgeon: Hollice Espy, MD;  Location: [facility] ORS;  Service: Urology;  Laterality: Bilateral;  . ESOPHAGOGASTRODUODENOSCOPY (EGD) WITH PROPOFOL N/A 02/13/2017   Procedure: ESOPHAGOGASTRODUODENOSCOPY (EGD) WITH PROPOFOL;  Surgeon: [name], MD;  Location[facility] ENDOSCOPY;  Service: Endoscopy;  Laterality: N/A;  . PORTA CATH INSERTION N/A 02/28/2017   Procedure: PORTA CATH INSERTION;  Surgeon: Algernon Huxley, MD;  Location: [place] CV LAB;  Service: Cardiovascular;  Laterality: N/A;  . SALPINGOOPHORECTOMY    . TONSILECTOMY/ADENOIDECTOMY WITH MYRINGOTOMY    . TONSILLECTOMY    . TRANSURETHRAL RESECTION OF BLADDER TUMOR N/A 02/13/2017  Procedure: TRANSURETHRAL RESECTION OF BLADDER TUMOR (TURBT);  Surgeon: Hollice Espy, MD;  Location: [facility] ORS;  Service: Urology;  Laterality: N/A;  . TUBAL LIGATION    . UPPER GI ENDOSCOPY  02/13/2017  . URETEROSCOPY Left 09/19/2017   Procedure:  URETEROSCOPY;  Surgeon: Hollice Espy, MD;  Location: [facility] ORS;  Service: Urology;  Laterality: Left;  Marland Kitchen VISCERAL ARTERY INTERVENTION N/A 02/12/2017   Procedure: VISCERAL ARTERY INTERVENTION;  Surgeon: Algernon Huxley, MD;  Location: [place] CV LAB;  Service: Cardiovascular;  Laterality: N/A;   Social History:  reports that she quit smoking about 13 years ago. Her smoking use included cigarettes. She has a 45.00 pack-year smoking history. She has never used smokeless tobacco. She reports that she does not drink alcohol or use drugs.  Allergies  Allergen Reactions  . Evista [Raloxifene] Other (See Comments)    Night sweats   . Fluticasone-Salmeterol Other (See Comments)    Cough, "chokes me"     Family History  Problem Relation Age of Onset  . Cirrhosis Father        died age 23  . Alcohol abuse Father   . Asthma Mother   . Congestive Heart Failure Mother   . Breast cancer Mother        2 (1/2 sisters)  . Osteoarthritis Mother   . Colon cancer Mother   . Lupus Sister   . Alcohol abuse Sister   . Ovarian cancer Sister   . Osteoporosis Sister   . Skin cancer Sister   . Breast cancer Cousin   . Breast cancer Maternal Aunt     Prior to Admission medications   Medication Sig Start Date End Date Taking? Authorizing Provider  amLODipine (NORVASC) 5 MG tablet Take one tablet bid Patient taking differently: Take 5 mg by mouth 2 (two) times daily.  10/05/17  Yes Einar Pheasant, MD  cholecalciferol (VITAMIN D) 1000 units tablet Take 1,000 Units by mouth daily.   Yes [provider]  ELIQUIS 5 MG TABS tablet TAKE 1 TABLET BY MOUTH TWO  TIMES DAILY 07/31/17  Yes Sindy Guadeloupe, MD  losartan (COZAAR) 100 MG tablet TAKE 1 TABLET BY MOUTH  DAILY Patient taking differently: TAKE 142m BY MOUTH  DAILY 01/04/17  Yes SEinar Pheasant MD  lovastatin (MEVACOR) 40 MG tablet TAKE 1 TABLET BY MOUTH  DAILY Patient taking differently: TAKE 1 TABLET BY MOUTH  DAILY (evening) 06/25/17  Yes  SEinar Pheasant MD  metoprolol tartrate (LOPRESSOR) 25 MG tablet Take 1 tablet (25 mg total) by mouth 2 (two) times daily. 10/05/17  Yes SEinar Pheasant MD  MYRBETRIQ 25 MG TB24 tablet Take 1 tablet by mouth daily. 09/04/17  Yes [provider]  albuterol (PROVENTIL HFA;VENTOLIN HFA) 108 (90 Base) MCG/ACT inhaler Inhale 2 puffs into the lungs every 6 (six) hours as needed for wheezing or shortness of breath. 08/23/16   KGladstone Lighter MD  fluticasone (FLONASE) 50 MCG/ACT nasal spray Place 2 sprays into both nostrils daily as needed for allergies. 09/24/17   SEinar Pheasant MD  ipratropium-albuterol (DUONEB) 0.5-2.5 (3) MG/3ML SOLN Take 3 mLs by nebulization every 6 (six) hours as needed (for shortness of breath). 11/14/14   GJoanne Gavel MD  potassium chloride (K-DUR,KLOR-CON) 10 MEQ tablet Take 1 tablet (10 mEq total) by mouth daily. Patient not taking: Reported on 10/15/2017 05/30/17   SEinar Pheasant MD   Physical Exam: Vitals:   10/20/17 1100 10/20/17 1130 10/20/17 1200 10/20/17 1230  BP: (!) 177/62 (Marland Kitchen  200/62 (!) 224/75 (!) 205/72  Pulse: 84 97 (!) 107   Resp: (!) 22 (!) 23 19 (!) 21  Temp:      TempSrc:      SpO2: 100% 95% 90%   Weight:         General:  WDWN, [place]/AT in moderate distress  Eyes: PERRL, EOMI, no scleral icterus, conjunctiva clear  ENT: moist oropharynx without exudate, TM's benign, dentition fair  Neck: supple, no lymphadenopathy. No bruits or thyromegaly  Cardiovascular: regular rate without MRG; 2+ peripheral pulses, no JVD, 1+ peripheral edema  Respiratory: basilar rales without wheezes or rhonchi. No dullness. Respiratory effort increased  Abdomen: soft, non tender to palpation, positive bowel sounds, no guarding, no rebound  Skin: no rashes or lesions  Musculoskeletal: normal bulk and tone, no joint swelling  Psychiatric: normal mood and affect, A&OX3  Neurologic: CN 2-12 grossly intact, Motor strength 5/5 in all 4 groups with symmetric  DTR's and non-focal sensory exam  Labs on Admission:  Basic Metabolic Panel: Recent Labs  Lab 10/15/17 1638 10/20/17 1026  NA 141 140  K 3.8 3.4*  CL 104 107  CO2 28 23  GLUCOSE 120* 152*  BUN 24* 22*  CREATININE 1.19 1.07*  CALCIUM 9.7 8.8*   Liver Function Tests: Recent Labs  Lab 10/20/17 1026  AST 23  ALT 18  ALKPHOS 55  BILITOT 0.8  PROT 6.8  ALBUMIN 3.5   No results for input(s): LIPASE, AMYLASE in the last 168 hours. No results for input(s): AMMONIA in the last 168 hours. CBC: Recent Labs  Lab 10/20/17 1026  WBC 12.7*  NEUTROABS 10.0*  HGB 13.0  HCT 37.8  MCV 91.9  PLT 181   Cardiac Enzymes: Recent Labs  Lab 10/20/17 1026  TROPONINI <0.03    BNP (last 3 results) Recent Labs    10/20/17 1026  BNP 517.0*    ProBNP (last 3 results) No results for input(s): PROBNP in the last 8760 hours.  CBG: No results for input(s): GLUCAP in the last 168 hours.  Radiological Exams on Admission: Dg Chest 2 View  Result Date: 10/20/2017 CLINICAL DATA:  History of urinary bladder carcinoma. Cough and congestion. EXAM: CHEST - 2 VIEW COMPARISON:  Chest radiograph February 11, 2017 and chest CT September 07, 2017 FINDINGS: There are pleural effusions bilaterally. There is no appreciable edema or consolidation. There is bibasilar atelectasis. There is mild cardiomegaly with pulmonary venous hypertension. No adenopathy. There is aortic atherosclerosis. Port-A-Cath tip is in the superior vena cava. No pneumothorax. No blastic or lytic bone lesions are evident. IMPRESSION: Pulmonary vascular congestion. Pleural effusions bilaterally with bibasilar atelectasis. No consolidation. There is aortic atherosclerosis. No evident adenopathy. Port-A-Cath tip in superior vena cava. Aortic Atherosclerosis (ICD10-I70.0). Electronically Signed   By: Lowella Grip III M.D.   On: 10/20/2017 12:06   Ct Angio Chest Pe W/cm &/or Wo Cm  Result Date: 10/20/2017 CLINICAL DATA:  Shortness of  breath, cough, and hypoxia. High pretest probability for pulmonary embolism. Bladder carcinoma. EXAM: CT ANGIOGRAPHY CHEST WITH CONTRAST TECHNIQUE: Multidetector CT imaging of the chest was performed using the standard protocol during bolus administration of intravenous contrast. Multiplanar CT image reconstructions and MIPs were obtained to evaluate the vascular anatomy. CONTRAST:  39m ISOVUE-370 IOPAMIDOL (ISOVUE-370) INJECTION 76% COMPARISON:  09/07/2017 FINDINGS: Cardiovascular: Satisfactory opacification of pulmonary arteries noted, and no pulmonary emboli identified. No evidence of thoracic aortic dissection or aneurysm. Aortic and coronary artery atherosclerosis. Stable mild cardiomegaly. Mediastinum/Nodes: No masses or  pathologically enlarged lymph nodes identified. Lungs/Pleura: New small bilateral pleural effusions and compressive atelectasis in both lung bases. Increased pulmonary interstitial prominence and ground-glass opacity, suspicious for mild edema. No evidence of pulmonary consolidation or central airway obstruction. Upper abdomen: No acute findings. Large left renal cyst again noted. Musculoskeletal: No suspicious bone lesions identified. Review of the MIP images confirms the above findings. IMPRESSION: No evidence of pulmonary embolism. New mild pulmonary interstitial prominence and ground-glass opacity, with small bilateral pleural effusions, suspicious for pulmonary edema/congestive heart failure. Aortic Atherosclerosis (ICD10-I70.0). Mild cardiomegaly and coronary artery calcification. Electronically Signed   By: Earle Gell M.D.   On: 10/20/2017 12:14    EKG: Independently reviewed.  Assessment/Plan Principal Problem:   CHF (congestive heart failure) (HCC) Active Problems:   CAD (coronary artery disease)   CKD (chronic kidney disease), stage III (HCC)   Acute respiratory distress   Will admit to floor with O2, IV Lasix, SVN's, and NTP. Follow enzymes. Echo and Cardiology  consult ordered. Monitor BP and sugars closely. Replace K+. Repeat labs in AM  Diet: low salt Fluids: saline lock DVT Prophylaxis: Lovenox  Code Status: FULL  Family Communication: yes  Disposition Plan: home  Time spent: 50 min

## 2017-10-20 NOTE — ED Notes (Signed)
Pt L chest port accessed using sterile technique by this RN.

## 2017-10-20 NOTE — ED Notes (Signed)
Pt states congestion, SOB, swelling x 4 days. Hx COPD. Has nebs and inhaler at home. No home oxygen. Former smoker.   Alert, oriented. Sister at bedside.

## 2017-10-20 NOTE — ED Notes (Signed)
Pt taken to scan

## 2017-10-20 NOTE — ED Notes (Signed)
Pt returned from scans at this time

## 2017-10-20 NOTE — ED Notes (Signed)
Pt ambulatory to toilet to urinate. While walking back appeared to become SOB again. When placed back on monitor was 88% on RA. Placed on 2 L nasal cannula. Will continue to monitor.

## 2017-10-20 NOTE — ED Triage Notes (Addendum)
Pt to ed with c/o cough, congestion, sob and bilat hand and feet swelling x 4 days.  sats at triage are 97% on RA.  Pt does appear sob and mildly tachypneic.  Pt states sob is worse with activity and with attempting to lay flat.  Denies chest pain.

## 2017-10-20 NOTE — ED Provider Notes (Addendum)
[facility] Emergency Department Provider Note  ____________________________________________   First MD Initiated Contact with Patient 10/20/17 1019     (approximate)  I have reviewed the triage vital signs and the nursing notes.   HISTORY  Chief Complaint Shortness of Breath; Cough; and Nasal Congestion   HPI Tina [name] is a 83 y.o. female who presents to the emergency department with cough, shortness of breath, hand and foot swelling for the past 4 days along with sinus congestion.  Her symptoms began insidiously and have been slowly progressive.  They are worse with exertion improved with rest.  Her cough is somewhat productive.  She thinks she has unintentionally gained some weight.  She has a past medical history of COPD, coronary artery disease, as well as bladder cancer.  She has no history of DVT or pulmonary embolism.  She has no particular pain.  No recent travel.  Past Medical History:  Diagnosis Date  . Anemia   . Asthma   . Bladder cancer ([place])   . BRCA positive   . CAD (coronary artery disease)   . COPD (chronic obstructive pulmonary disease) ([place])   . Emphysema lung ([place])   . GERD (gastroesophageal reflux disease)   . History of colon polyps   . Hypercholesterolemia   . Hyperglycemia   . Hypertension   . Lung nodules   . Osteoporosis   . Personal history of tobacco use, presenting hazards to health 11/25/2014  . Polycythemia vera(238.4)   . Renal cyst   . Skin cancer     Patient Active Problem List   Diagnosis Date Noted  . Acute respiratory distress 10/20/2017  . CHF (congestive heart failure) ([place]) 10/20/2017  . Acute respiratory failure with hypoxia ([place]) 10/08/2017  . Pulmonary embolus ([place]) 06/28/2017  . 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 Goals of care, counseling/discussion 03/02/2017  . Abnormal computed tomography of gastrointestinal tract   . Noninfectious diarrhea   . Ulceration of intestine   .  Hernia, hiatal   . Bladder mass   . Mesenteric ischemia due to arterial insufficiency ([place])   . Nausea without vomiting   . Abdominal pain 02/11/2017  . Nausea 02/08/2017  . Diarrhea 02/08/2017  . Hematuria 02/08/2017  . Polycythemia vera ([place]) 10/21/2016  . Acute bronchitis 06/14/2016  . Elevated troponin 06/14/2016  . CKD (chronic kidney disease), stage III ([place]) 06/14/2016  . Leukocytosis 06/14/2016  . Abdominal bruit 04/16/2016  . Abnormal mammogram 06/06/2015  . Essential hypertension 02/16/2015  . Numbness 01/24/2015  . Personal history of tobacco use, presenting hazards to health 11/25/2014  . BRCA positive   . COPD exacerbation ([place]) 11/14/2014  . Sensation of pressure in bladder area 08/15/2014  . Health care maintenance 08/15/2014  . Presence of coronary angioplasty implant and graft 09/30/2013  . History of colonic polyps 08/25/2012  . Female genuine stress incontinence 06/28/2012  . Infection of urinary tract 06/28/2012  . CAD (coronary artery disease) 05/23/2012  . COPD (chronic obstructive pulmonary disease) ([place]) 05/23/2012  . Hypertension 05/23/2012  . Hypercholesterolemia 05/23/2012  . Hyperglycemia 05/23/2012  . Polycythemia, secondary 05/23/2012  . Renal cyst 05/23/2012  . Osteoporosis 05/23/2012    Past Surgical History:  Procedure Laterality Date  . ANGIOPLASTY     coronary (x1)  . COLONOSCOPY WITH PROPOFOL N/A 03/02/2015   Procedure: COLONOSCOPY WITH PROPOFOL;  Surgeon: Lollie Sails, MD;  Location: [facility] ENDOSCOPY;  Service: Endoscopy;  Laterality: N/A;  . COLONOSCOPY WITH  PROPOFOL N/A 02/13/2017   Procedure: COLONOSCOPY WITH PROPOFOL;  Surgeon: [name], MD;  Location: [facility] ENDOSCOPY;  Service: Endoscopy;  Laterality: N/A;  . CORONARY ANGIOPLASTY    . CORONARY ARTERY BYPASS GRAFT  09/17/2017   pt denies  . CYSTOSCOPY W/ RETROGRADES Bilateral 02/13/2017   Procedure: CYSTOSCOPY WITH RETROGRADE PYELOGRAM;  Surgeon: Hollice Espy, MD;   Location: [facility] ORS;  Service: Urology;  Laterality: Bilateral;  . CYSTOSCOPY W/ RETROGRADES Bilateral 09/19/2017   Procedure: CYSTOSCOPY WITH RETROGRADE PYELOGRAM;  Surgeon: Hollice Espy, MD;  Location: [facility] ORS;  Service: Urology;  Laterality: Bilateral;  . CYSTOSCOPY WITH BIOPSY  03/26/2017   Procedure: CYSTOSCOPY WITH BIOPSY;  Surgeon: Hollice Espy, MD;  Location: [facility] ORS;  Service: Urology;;  . Consuela Mimes WITH STENT PLACEMENT Bilateral 02/13/2017   Procedure: CYSTOSCOPY WITH STENT PLACEMENT;  Surgeon: Hollice Espy, MD;  Location: [facility] ORS;  Service: Urology;  Laterality: Bilateral;  . CYSTOSCOPY WITH STENT PLACEMENT Bilateral 03/26/2017   Procedure: CYSTOSCOPY WITH STENT EXCHANGE;  Surgeon: Hollice Espy, MD;  Location: [facility] ORS;  Service: Urology;  Laterality: Bilateral;  . ESOPHAGOGASTRODUODENOSCOPY (EGD) WITH PROPOFOL N/A 02/13/2017   Procedure: ESOPHAGOGASTRODUODENOSCOPY (EGD) WITH PROPOFOL;  Surgeon: [name], MD;  Location[facility] END[facility]COPY;  Service: Endoscopy;  Laterality: N/A;  . PORTA CATH INSERTION N/A 02/28/2017   Procedure: PORTA CATH INSERTION;  Surgeon: Algernon Huxley, MD;  Location: [place] CV LAB;  Service: Cardiovascular;  Laterality: N/A;  . SALPINGOOPHORECTOMY    . TONSILECTOMY/ADENOIDECTOMY WITH MYRINGOTOMY    . TONSILLECTOMY    . TRANSURETHRAL RESECTION OF BLADDER TUMOR N/A 02/13/2017   Procedure: TRANSURETHRAL RESECTION OF BLADDER TUMOR (TURBT);  Surgeon: Hollice Espy, MD;  Location: [facility] ORS;  Service: Urology;  Laterality: N/A;  . TUBAL LIGATION    . UPPER GI ENDOSCOPY  02/13/2017  . URETEROSCOPY Left 09/19/2017   Procedure: URETEROSCOPY;  Surgeon: Hollice Espy, MD;  Location: [facility] ORS;  Service: Urology;  Laterality: Left;  Marland Kitchen VISCERAL ARTERY INTERVENTION N/A 02/12/2017   Procedure: VISCERAL ARTERY INTERVENTION;  Surgeon: Algernon Huxley, MD;  Location: [place] CV LAB;  Service: Cardiovascular;  Laterality: N/A;    Prior to Admission  medications   Medication Sig Start Date End Date Taking? Authorizing Provider  amLODipine (NORVASC) 5 MG tablet Take one tablet bid Patient taking differently: Take 5 mg by mouth 2 (two) times daily.  10/05/17  Yes Einar Pheasant, MD  cholecalciferol (VITAMIN D) 1000 units tablet Take 1,000 Units by mouth daily.   Yes [provider]  ELIQUIS 5 MG TABS tablet TAKE 1 TABLET BY MOUTH TWO  TIMES DAILY 07/31/17  Yes Sindy Guadeloupe, MD  losartan (COZAAR) 100 MG tablet TAKE 1 TABLET BY MOUTH  DAILY Patient taking differently: TAKE 193m BY MOUTH  DAILY 01/04/17  Yes SEinar Pheasant MD  lovastatin (MEVACOR) 40 MG tablet TAKE 1 TABLET BY MOUTH  DAILY Patient taking differently: TAKE 1 TABLET BY MOUTH  DAILY (evening) 06/25/17  Yes SEinar Pheasant MD  metoprolol tartrate (LOPRESSOR) 25 MG tablet Take 1 tablet (25 mg total) by mouth 2 (two) times daily. 10/05/17  Yes SEinar Pheasant MD  MYRBETRIQ 25 MG TB24 tablet Take 1 tablet by mouth daily. 09/04/17  Yes [provider]  albuterol (PROVENTIL HFA;VENTOLIN HFA) 108 (90 Base) MCG/ACT inhaler Inhale 2 puffs into the lungs every 6 (six) hours as needed for wheezing or shortness of breath. 08/23/16   KGladstone Lighter MD  fluticasone (FLONASE) 50 MCG/ACT nasal spray Place 2 sprays into  both nostrils daily as needed for allergies. 09/24/17   Einar Pheasant, MD  ipratropium-albuterol (DUONEB) 0.5-2.5 (3) MG/3ML SOLN Take 3 mLs by nebulization every 6 (six) hours as needed (for shortness of breath). 11/14/14   Joanne Gavel, MD  potassium chloride (K-DUR,KLOR-CON) 10 MEQ tablet Take 1 tablet (10 mEq total) by mouth daily. Patient not taking: Reported on 10/15/2017 05/30/17   Einar Pheasant, MD    Allergies Evista [raloxifene] and Fluticasone-salmeterol  Family History  Problem Relation Age of Onset  . Cirrhosis Father        died age 11  . Alcohol abuse Father   . Asthma Mother   . Congestive Heart Failure Mother   . Breast cancer Mother          2 (1/2 sisters)  . Osteoarthritis Mother   . Colon cancer Mother   . Lupus Sister   . Alcohol abuse Sister   . Ovarian cancer Sister   . Osteoporosis Sister   . Skin cancer Sister   . Breast cancer Cousin   . Breast cancer Maternal Aunt     Social History Social History   Tobacco Use  . Smoking status: Former Smoker    Packs/day: 1.00    Years: 45.00    Pack years: 45.00    Types: Cigarettes    Last attempt to quit: 11/11/2003    Years since quitting: 13.9  . Smokeless tobacco: Never Used  Substance Use Topics  . Alcohol use: No    Alcohol/week: 0.0 oz    Comment: occasional  . Drug use: No    Review of Systems Constitutional: No fever/chills Eyes: No visual changes. ENT: No sore throat. Cardiovascular: Denies chest pain. Respiratory: Positive for shortness of breath. Gastrointestinal: No abdominal pain.  No nausea, no vomiting.  No diarrhea.  No constipation. Genitourinary: Negative for dysuria. Musculoskeletal: As if her leg swelling Skin: Negative for rash. Neurological: Negative for headaches, focal weakness or numbness.   ____________________________________________   PHYSICAL EXAM:  VITAL SIGNS: ED Triage Vitals  Enc Vitals Group     BP 10/20/17 1003 (!) 195/74     Pulse Rate 10/20/17 1003 91     Resp 10/20/17 1003 18     Temp 10/20/17 1003 98.2 F (36.8 C)     Temp Source 10/20/17 1003 Oral     SpO2 10/20/17 1003 97 %     Weight 10/20/17 1004 139 lb (63 kg)     Height --      Head Circumference --      Peak Flow --      Pain Score 10/20/17 1004 0     Pain Loc --      Pain Edu? --      Excl. in [place]? --     Constitutional: Appears obviously short of breath speaking in short sentences with elevated respiratory rate Eyes: PERRL EOMI. Head: Atraumatic. Nose: Positive for congestion Mouth/Throat: No trismus Neck: No stridor.   Cardiovascular: Normal rate, regular rhythm. Grossly normal heart sounds.  Good peripheral  circulation. Respiratory: Creased respiratory rate speaking in short sentences crackles at bilateral bases Gastrointestinal: Soft nontender Musculoskeletal: Legs equal in size with mild lower extremity edema Neurologic:  No gross focal neurologic deficits are appreciated. Skin:  Skin is warm, dry and intact. No rash noted. Psychiatric: Somewhat anxious appearing    ____________________________________________   DIFFERENTIAL includes but not limited to  Pulmonary embolism, COPD exacerbation, pneumonia, pneumothorax, heart failure ____________________________________________   LABS (all labs  ordered are listed, but only abnormal results are displayed)  Labs Reviewed  COMPREHENSIVE METABOLIC PANEL - Abnormal; Notable for the following components:      Result Value   Potassium 3.4 (*)    Glucose, Bld 152 (*)    BUN 22 (*)    Creatinine, Ser 1.07 (*)    Calcium 8.8 (*)    GFR calc non Af Amer 48 (*)    GFR calc Af Amer 55 (*)    All other components within normal limits  BRAIN NATRIURETIC PEPTIDE - Abnormal; Notable for the following components:   B Natriuretic Peptide 517.0 (*)    All other components within normal limits  CBC WITH DIFFERENTIAL/PLATELET - Abnormal; Notable for the following components:   WBC 12.7 (*)    Neutro Abs 10.0 (*)    Monocytes Absolute 1.5 (*)    All other components within normal limits  CBC - Abnormal; Notable for the following components:   WBC 12.6 (*)    All other components within normal limits  COMPREHENSIVE METABOLIC PANEL - Abnormal; Notable for the following components:   CO2 21 (*)    Glucose, Bld 201 (*)    BUN 33 (*)    Creatinine, Ser 1.44 (*)    AST 43 (*)    GFR calc non Af Amer 33 (*)    GFR calc Af Amer 39 (*)    All other components within normal limits  GLUCOSE, CAPILLARY - Abnormal; Notable for the following components:   Glucose-Capillary 257 (*)    All other components within normal limits  GLUCOSE, CAPILLARY -  Abnormal; Notable for the following components:   Glucose-Capillary 167 (*)    All other components within normal limits  TROPONIN I  TROPONIN I  TROPONIN I  TROPONIN I    Lab work reviewed by me with slightly elevated BNP concerning for new onset heart failure __________________________________________  EKG  ED ECG REPORT I, Darel Hong, the attending physician, personally viewed and interpreted this ECG.  Date: 10/20/2017 EKG Time:  Rate: 97 Rhythm: Atrial fibrillation QRS Axis: normal Intervals: normal ST/T Wave abnormalities: normal Narrative Interpretation: no evidence of acute ischemia  ____________________________________________  RADIOLOGY  CT angiogram reviewed by me consistent with pulmonary edema ____________________________________________   PROCEDURES  Procedure(s) performed: no  .Critical Care Performed by: Darel Hong, MD Authorized by: Darel Hong, MD   Critical care provider statement:    Critical care time (minutes):  30   Critical care time was exclusive of:  Separately billable procedures and treating other patients   Critical care was necessary to treat or prevent imminent or life-threatening deterioration of the following conditions:  Respiratory failure   Critical care was time spent personally by me on the following activities:  Development of treatment plan with patient or surrogate, discussions with consultants, evaluation of patient's response to treatment, examination of patient, obtaining history from patient or surrogate, ordering and performing treatments and interventions, ordering and review of laboratory studies, ordering and review of radiographic studies, pulse oximetry, re-evaluation of patient's condition and review of old charts    Critical Care performed: no  Observation: no ____________________________________________   INITIAL IMPRESSION / ASSESSMENT AND PLAN / ED COURSE  Pertinent labs & imaging results  that were available during my care of the patient were reviewed by me and considered in my medical decision making (see chart for details).  The patient arrives with undifferentiated shortness of breath requiring oxygen supplementation.  She is saturating in  the high 80s on room air although comes up on nasal cannula.  Differential is broad but includes new heart failure, COPD exacerbation, and pulmonary embolism.  2 duo nebs now while the CT angiogram is pending.     ----------------------------------------- 12:12 PM on 10/20/2017 -----------------------------------------  The patient has an elevated BNP along with some leg swelling and a CT scan concerning for pulmonary edema which likely represents new onset heart failure.  Her blood pressure has progressively become quite high to the 230s so we will place her on a nitro patch and begin diuresis with IV Lasix.  At this point as she desaturates to the 80s off of oxygen and has essentially no exercise tolerance she requires inpatient admission for supplemental oxygen, and new echocardiogram, and medical optimization.  The patient family verbalized understanding agree with plan.  I have discussed with the hospitalist Dr. Doy Hutching who has graciously agreed to admit the patient to his service. ____________________________________________   FINAL CLINICAL IMPRESSION(S) / ED DIAGNOSES  Final diagnoses:  Acute heart failure, unspecified heart failure type ([place])      NEW MEDICATIONS STARTED DURING THIS VISIT:  Current Discharge Medication List       Note:  This document was prepared using Dragon voice recognition software and may include unintentional dictation errors.     Darel Hong, MD 10/21/17 [place], Madia Carvell, MD 10/29/17 [id number]

## 2017-10-21 LAB — CBC
HCT: 36.5 % (ref 35.0–47.0)
HEMOGLOBIN: 13.1 g/dL (ref 12.0–16.0)
MCH: 32.1 pg (ref 26.0–34.0)
MCHC: 35.9 g/dL (ref 32.0–36.0)
MCV: 89.4 fL (ref 80.0–100.0)
Platelets: 195 10*3/uL (ref 150–440)
RBC: 4.09 MIL/uL (ref 3.80–5.20)
RDW: 13.4 % (ref 11.5–14.5)
WBC: 12.6 10*3/uL — ABNORMAL HIGH (ref 3.6–11.0)

## 2017-10-21 LAB — COMPREHENSIVE METABOLIC PANEL
ALK PHOS: 56 U/L (ref 38–126)
ALT: 18 U/L (ref 14–54)
AST: 43 U/L — ABNORMAL HIGH (ref 15–41)
Albumin: 3.5 g/dL (ref 3.5–5.0)
Anion gap: 14 (ref 5–15)
BUN: 33 mg/dL — ABNORMAL HIGH (ref 6–20)
CALCIUM: 9 mg/dL (ref 8.9–10.3)
CO2: 21 mmol/L — AB (ref 22–32)
CREATININE: 1.44 mg/dL — AB (ref 0.44–1.00)
Chloride: 101 mmol/L (ref 101–111)
GFR, EST AFRICAN AMERICAN: 39 mL/min — AB (ref >60)
GFR, EST NON AFRICAN AMERICAN: 33 mL/min — AB (ref >60)
Glucose, Bld: 201 mg/dL — ABNORMAL HIGH (ref 65–99)
Potassium: 4.3 mmol/L (ref 3.5–5.1)
Sodium: 136 mmol/L (ref 135–145)
Total Bilirubin: 0.9 mg/dL (ref 0.3–1.2)
Total Protein: 6.9 g/dL (ref 6.5–8.1)

## 2017-10-21 LAB — GLUCOSE, CAPILLARY
GLUCOSE-CAPILLARY: 164 mg/dL — AB (ref 65–99)
Glucose-Capillary: 167 mg/dL — ABNORMAL HIGH (ref 65–99)
Glucose-Capillary: 172 mg/dL — ABNORMAL HIGH (ref 65–99)
Glucose-Capillary: 194 mg/dL — ABNORMAL HIGH (ref 65–99)

## 2017-10-21 LAB — TROPONIN I: Troponin I: <0.03 ng/mL (ref <0.03)

## 2017-10-21 LAB — ECHOCARDIOGRAM COMPLETE: WEIGHTICAEL: 2224 [oz_av]

## 2017-10-21 MED ORDER — MOMETASONE FURO-FORMOTEROL FUM 100-5 MCG/ACT IN AERO
2.0000 | INHALATION_SPRAY | Freq: Two times a day (BID) | RESPIRATORY_TRACT | Status: DC
  Administered 2017-10-21 – 2017-10-22 (×3): 2 via RESPIRATORY_TRACT
  Filled 2017-10-21: qty 8.8

## 2017-10-21 MED ORDER — ALBUTEROL SULFATE (2.5 MG/3ML) 0.083% IN NEBU: 2.5000 mg | INHALATION_SOLUTION | Freq: Three times a day (TID) | RESPIRATORY_TRACT | Status: DC

## 2017-10-21 MED ORDER — METHYLPREDNISOLONE SODIUM SUCC 40 MG IJ SOLR
40.0000 mg | Freq: Two times a day (BID) | INTRAMUSCULAR | Status: DC
  Administered 2017-10-21 (×2): 40 mg via INTRAVENOUS
  Filled 2017-10-21 (×2): qty 1

## 2017-10-21 MED ORDER — IPRATROPIUM-ALBUTEROL 0.5-2.5 (3) MG/3ML IN SOLN
3.0000 mL | Freq: Three times a day (TID) | RESPIRATORY_TRACT | Status: DC
  Administered 2017-10-21 (×2): 3 mL via RESPIRATORY_TRACT
  Filled 2017-10-21 (×2): qty 3

## 2017-10-21 MED ORDER — IPRATROPIUM BROMIDE 0.02 % IN SOLN: 0.5000 mg | Freq: Three times a day (TID) | RESPIRATORY_TRACT | Status: DC

## 2017-10-21 NOTE — Consult Note (Addendum)
Cardiology Consultation Note    [name], MRN: [id number], DOB/AGE: [birth year] 83 y.o. Admit date: 10/20/2017   Date of Consult: 10/21/2017 Primary Physician: Einar Pheasant, MD Primary Cardiologist: Dr. Saralyn Pilar  Chief Complaint: sob Reason for Consultation: Duanne Limerick Requesting MD: Dr. Benjie Karvonen  HPI: Tina [name] is a 83 y.o. female with history of coronary artery disease status post PCI of the OM 1 and 2003, history of atrial fibrillation treated with rate control and anticoagulation, history of hypertension, hyperlipidemia, COPD, history of bladder carcinoma status post chemotherapy and radiation who presented to the room with complaints of 3 to 4-day history of increasing shortness of breath with cough and increasing lower extremity edema.  Chest x-ray in the emergency room revealed mild pulmonary.  She has a history of preserved LV function with echocardiogram done  February 2018 revealing normal LV function with mild MR and mild TR.  EKG on presentation revealed atrial fibrillation with controlled ventricular response.  She is ruled out for myocardial infarction.  Creatinine on presentation was 1.07 with a BUN of 22.  This a.m. creatinine increased to 1.44 with a BUN of 33.  He had mildly elevated white count.  As an outpatient she is on amlodipine at 5 mg daily, losartan 100 mg daily, metoprolol tartrate 25 mg twice daily, and triamterene hydrochlorothiazide 37.5-25 mg daily for hypertension.  She is on lovastatin 40 mg daily for hyperlipidemia.  She is on apixaban for anticoagulation for her atrial fibrillation.  She is a former tobacco smoker but states she stopped this habit.  She was last seen in the outpatient setting in April 2019 and was doing fairly well at that time.  Past Medical History:  Diagnosis Date  . Anemia   . Asthma   . Bladder cancer ([place])   . BRCA positive   . CAD (coronary artery disease)   . COPD (chronic obstructive pulmonary disease) ([place])   . Emphysema  lung ([place])   . GERD (gastroesophageal reflux disease)   . History of colon polyps   . Hypercholesterolemia   . Hyperglycemia   . Hypertension   . Lung nodules   . Osteoporosis   . Personal history of tobacco use, presenting hazards to health 11/25/2014  . Polycythemia vera(238.4)   . Renal cyst   . Skin cancer       Surgical History:  Past Surgical History:  Procedure Laterality Date  . ANGIOPLASTY     coronary (x1)  . COLONOSCOPY WITH PROPOFOL N/A 03/02/2015   Procedure: COLONOSCOPY WITH PROPOFOL;  Surgeon: Lollie Sails, MD;  Location: [facility] ENDOSCOPY;  Service: Endoscopy;  Laterality: N/A;  . COLONOSCOPY WITH PROPOFOL N/A 02/13/2017   Procedure: COLONOSCOPY WITH PROPOFOL;  Surgeon: [name], MD;  Location: [facility] ENDOSCOPY;  Service: Endoscopy;  Laterality: N/A;  . CORONARY ANGIOPLASTY    . CORONARY ARTERY BYPASS GRAFT  09/17/2017   pt denies  . CYSTOSCOPY W/ RETROGRADES Bilateral 02/13/2017   Procedure: CYSTOSCOPY WITH RETROGRADE PYELOGRAM;  Surgeon: Hollice Espy, MD;  Location: [facility] ORS;  Service: Urology;  Laterality: Bilateral;  . CYSTOSCOPY W/ RETROGRADES Bilateral 09/19/2017   Procedure: CYSTOSCOPY WITH RETROGRADE PYELOGRAM;  Surgeon: Hollice Espy, MD;  Location: [facility] ORS;  Service: Urology;  Laterality: Bilateral;  . CYSTOSCOPY WITH BIOPSY  03/26/2017   Procedure: CYSTOSCOPY WITH BIOPSY;  Surgeon: Hollice Espy, MD;  Location: [facility] ORS;  Service: Urology;;  . Consuela Mimes WITH STENT PLACEMENT Bilateral 02/13/2017   Procedure: CYSTOSCOPY WITH STENT PLACEMENT;  Surgeon:  Hollice Espy, MD;  Location: [facility] ORS;  Service: Urology;  Laterality: Bilateral;  . CYSTOSCOPY WITH STENT PLACEMENT Bilateral 03/26/2017   Procedure: CYSTOSCOPY WITH STENT EXCHANGE;  Surgeon: Hollice Espy, MD;  Location: [facility] ORS;  Service: Urology;  Laterality: Bilateral;  . ESOPHAGOGASTRODUODENOSCOPY (EGD) WITH PROPOFOL N/A 02/13/2017   Procedure: ESOPHAGOGASTRODUODENOSCOPY (EGD) WITH  PROPOFOL;  Surgeon: [name], MD;  Location: [facility] ENDOSCOPY;  Service: Endoscopy;  Laterality: N/A;  . PORTA CATH INSERTION N/A 02/28/2017   Procedure: PORTA CATH INSERTION;  Surgeon: Algernon Huxley, MD;  Location: [place] CV LAB;  Service: Cardiovascular;  Laterality: N/A;  . SALPINGOOPHORECTOMY    . TONSILECTOMY/ADENOIDECTOMY WITH MYRINGOTOMY    . TONSILLECTOMY    . TRANSURETHRAL RESECTION OF BLADDER TUMOR N/A 02/13/2017   Procedure: TRANSURETHRAL RESECTION OF BLADDER TUMOR (TURBT);  Surgeon: Hollice Espy, MD;  Location: [facility] ORS;  Service: Urology;  Laterality: N/A;  . TUBAL LIGATION    . UPPER GI ENDOSCOPY  02/13/2017  . URETEROSCOPY Left 09/19/2017   Procedure: URETEROSCOPY;  Surgeon: Hollice Espy, MD;  Location: [facility] ORS;  Service: Urology;  Laterality: Left;  Marland Kitchen VISCERAL ARTERY INTERVENTION N/A 02/12/2017   Procedure: VISCERAL ARTERY INTERVENTION;  Surgeon: Algernon Huxley, MD;  Location: [place] [facility] LAB;  Service: Cardiovascular;  Laterality: N/A;     Home Meds: Prior to Admission medications   Medication Sig Start Date End Date Taking? Authorizing Provider  amLODipine (NORVASC) 5 MG tablet Take one tablet bid Patient taking differently: Take 5 mg by mouth 2 (two) times daily.  10/05/17  Yes Einar Pheasant, MD  cholecalciferol (VITAMIN D) 1000 units tablet Take 1,000 Units by mouth daily.   Yes [provider]  ELIQUIS 5 MG TABS tablet TAKE 1 TABLET BY MOUTH TWO  TIMES DAILY 07/31/17  Yes Sindy Guadeloupe, MD  losartan (COZAAR) 100 MG tablet TAKE 1 TABLET BY MOUTH  DAILY Patient taking differently: TAKE 160m BY MOUTH  DAILY 01/04/17  Yes SEinar Pheasant MD  lovastatin (MEVACOR) 40 MG tablet TAKE 1 TABLET BY MOUTH  DAILY Patient taking differently: TAKE 1 TABLET BY MOUTH  DAILY (evening) 06/25/17  Yes SEinar Pheasant MD  metoprolol tartrate (LOPRESSOR) 25 MG tablet Take 1 tablet (25 mg total) by mouth 2 (two) times daily. 10/05/17  Yes SEinar Pheasant MD   MYRBETRIQ 25 MG TB24 tablet Take 1 tablet by mouth daily. 09/04/17  Yes [provider]  albuterol (PROVENTIL HFA;VENTOLIN HFA) 108 (90 Base) MCG/ACT inhaler Inhale 2 puffs into the lungs every 6 (six) hours as needed for wheezing or shortness of breath. 08/23/16   KGladstone Lighter MD  fluticasone (FLONASE) 50 MCG/ACT nasal spray Place 2 sprays into both nostrils daily as needed for allergies. 09/24/17   SEinar Pheasant MD  ipratropium-albuterol (DUONEB) 0.5-2.5 (3) MG/3ML SOLN Take 3 mLs by nebulization every 6 (six) hours as needed (for shortness of breath). 11/14/14   GJoanne Gavel MD  potassium chloride (K-DUR,KLOR-CON) 10 MEQ tablet Take 1 tablet (10 mEq total) by mouth daily. Patient not taking: Reported on 10/15/2017 05/30/17   SEinar Pheasant MD    Inpatient Medications:  . amLODipine  5 mg Oral BID  . apixaban  5 mg Oral BID  . cholecalciferol  1,000 Units Oral Daily  . docusate sodium  100 mg Oral BID  . furosemide  40 mg Intravenous Q12H  . insulin aspart  0-9 Units Subcutaneous TID WC  . ipratropium-albuterol  3 mL Nebulization QID  . losartan  100  mg Oral Daily  . metoprolol tartrate  25 mg Oral BID  . mirabegron ER  25 mg Oral Daily  . nitroGLYCERIN  1 inch Topical Q6H  . potassium chloride  20 mEq Oral BID  . pravastatin  40 mg Oral q1800  . sodium chloride flush  3 mL Intravenous Q12H   . sodium chloride      Allergies:  Allergies  Allergen Reactions  . Evista [Raloxifene] Other (See Comments)    Night sweats   . Fluticasone-Salmeterol Other (See Comments)    Cough, "chokes me"     Social History   Socioeconomic History  . Marital status: Widowed    Spouse name: Not on file  . Number of children: 2  . Years of education: Not on file  . Highest education level: Not on file  Occupational History  . Not on file  Social Needs  . Financial resource strain: Not hard at all  . Food insecurity:    Worry: Never true    Inability: Never true  .  Transportation needs:    Medical: No    Non-medical: No  Tobacco Use  . Smoking status: Former Smoker    Packs/day: 1.00    Years: 45.00    Pack years: 45.00    Types: Cigarettes    Last attempt to quit: 11/11/2003    Years since quitting: 13.9  . Smokeless tobacco: Never Used  Substance and Sexual Activity  . Alcohol use: No    Alcohol/week: 0.0 oz    Comment: occasional  . Drug use: No  . Sexual activity: Never  Lifestyle  . Physical activity:    Days per week: 2 days    Minutes per session: 90 min  . Stress: Not at all  Relationships  . Social connections:    Talks on phone: Not on file    Gets together: Not on file    Attends religious service: Not on file    Active member of club or organization: Not on file    Attends meetings of clubs or organizations: Not on file    Relationship status: Not on file  . Intimate partner violence:    Fear of current or ex partner: No    Emotionally abused: No    Physically abused: No    Forced sexual activity: No  Other Topics Concern  . Not on file  Social History Narrative  . Not on file     Family History  Problem Relation Age of Onset  . Cirrhosis Father        died age 52  . Alcohol abuse Father   . Asthma Mother   . Congestive Heart Failure Mother   . Breast cancer Mother        2 (1/2 sisters)  . Osteoarthritis Mother   . Colon cancer Mother   . Lupus Sister   . Alcohol abuse Sister   . Ovarian cancer Sister   . Osteoporosis Sister   . Skin cancer Sister   . Breast cancer Cousin   . Breast cancer Maternal Aunt      Review of Systems: A 12-system review of systems was performed and is negative except as noted in the HPI.  Labs: Recent Labs    10/20/17 1026 10/20/17 1413 10/20/17 2002 10/21/17 0331  TROPONINI <0.03 <0.03 <0.03 <0.03   Lab Results  Component Value Date   WBC 12.6 (H) 10/21/2017   HGB 13.1 10/21/2017   HCT 36.5 10/21/2017  MCV 89.4 10/21/2017   PLT 195 10/21/2017    Recent Labs   Lab 10/21/17 0331  NA 136  K 4.3  CL 101  CO2 21*  BUN 33*  CREATININE 1.44*  CALCIUM 9.0  PROT 6.9  BILITOT 0.9  ALKPHOS 56  ALT 18  AST 43*  GLUCOSE 201*   Lab Results  Component Value Date   CHOL 158 02/16/2016   HDL 60.10 02/16/2016   LDLCALC 81 02/16/2016   TRIG 86.0 02/16/2016   No results found for: DDIMER  Radiology/Studies:  Dg Chest 2 View  Result Date: 10/20/2017 CLINICAL DATA:  History of urinary bladder carcinoma. Cough and congestion. EXAM: CHEST - 2 VIEW COMPARISON:  Chest radiograph February 11, 2017 and chest CT September 07, 2017 FINDINGS: There are pleural effusions bilaterally. There is no appreciable edema or consolidation. There is bibasilar atelectasis. There is mild cardiomegaly with pulmonary venous hypertension. No adenopathy. There is aortic atherosclerosis. Port-A-Cath tip is in the superior vena cava. No pneumothorax. No blastic or lytic bone lesions are evident. IMPRESSION: Pulmonary vascular congestion. Pleural effusions bilaterally with bibasilar atelectasis. No consolidation. There is aortic atherosclerosis. No evident adenopathy. Port-A-Cath tip in superior vena cava. Aortic Atherosclerosis (ICD10-I70.0). Electronically Signed   By: Lowella Grip III M.D.   On: 10/20/2017 12:06   Ct Angio Chest Pe W/cm &/or Wo Cm  Result Date: 10/20/2017 CLINICAL DATA:  Shortness of breath, cough, and hypoxia. High pretest probability for pulmonary embolism. Bladder carcinoma. EXAM: CT ANGIOGRAPHY CHEST WITH CONTRAST TECHNIQUE: Multidetector CT imaging of the chest was performed using the standard protocol during bolus administration of intravenous contrast. Multiplanar CT image reconstructions and MIPs were obtained to evaluate the vascular anatomy. CONTRAST:  5m ISOVUE-370 IOPAMIDOL (ISOVUE-370) INJECTION 76% COMPARISON:  09/07/2017 FINDINGS: Cardiovascular: Satisfactory opacification of pulmonary arteries noted, and no pulmonary emboli identified. No evidence of  thoracic aortic dissection or aneurysm. Aortic and coronary artery atherosclerosis. Stable mild cardiomegaly. Mediastinum/Nodes: No masses or pathologically enlarged lymph nodes identified. Lungs/Pleura: New small bilateral pleural effusions and compressive atelectasis in both lung bases. Increased pulmonary interstitial prominence and ground-glass opacity, suspicious for mild edema. No evidence of pulmonary consolidation or central airway obstruction. Upper abdomen: No acute findings. Large left renal cyst again noted. Musculoskeletal: No suspicious bone lesions identified. Review of the MIP images confirms the above findings. IMPRESSION: No evidence of pulmonary embolism. New mild pulmonary interstitial prominence and ground-glass opacity, with small bilateral pleural effusions, suspicious for pulmonary edema/congestive heart failure. Aortic Atherosclerosis (ICD10-I70.0). Mild cardiomegaly and coronary artery calcification. Electronically Signed   By: JEarle GellM.D.   On: 10/20/2017 12:14    Wt Readings from Last 3 Encounters:  10/21/17 64.4 kg (142 lb)  10/15/17 63.2 kg (139 lb 6.4 oz)  10/05/17 60.4 kg (133 lb 3.2 oz)    EKG: Atrial fibrillation with controlled ventricular response.  No ischemic changes.  Physical Exam:  Blood pressure (!) 166/69, pulse (!) 101, temperature 98.7 F (37.1 C), temperature source Oral, resp. rate 18, weight 64.4 kg (142 lb), SpO2 98 %. Body mass index is 23.63 kg/m. General: Well developed, well nourished with some shortness of breath. Head: Normocephalic, atraumatic, sclera non-icteric, no xanthomas, nares are without discharge. Complaints of edema under her eyes. Neck: Negative for carotid bruits. JVD not elevated. Lungs: Bilateral rhonchi with expiratory and. Heart: Irregularly irregular rhythm. No murmurs, rubs, or gallops appreciated. Abdomen: Soft, non-tender, non-distended with normoactive bowel sounds. No hepatomegaly. No rebound/guarding. No obvious  abdominal masses. Msk:  Strength and tone appear normal for age. Extremities: No clubbing or cyanosis.  No edema lower extremities..  Distal pedal pulses are 2+ and equal bilaterally. Neuro: Alert and oriented X 3. No facial asymmetry. No focal deficit. Moves all extremities spontaneously. Psych:  Responds to questions appropriately with a normal affect.     Assessment and Plan  83 year old female with history of coronary disease, chronic atrial fibrillation, COPD who was admitted with complaints of increasing shortness of breath and peripheral edema.  She has ruled out for myocardial infarction.  Has a mild evidence of possible volume overload on chest x-ray.  She has improved with diuresis although has evidence of volume depletion based on her renal function.  She has diuresed 260 cc since admission.  Her weight was 64.4 kg on admission.  Admission weight was 63 kg yesterday.  Patient still complains of shortness of breath with a cough and wheezing.  CHF-patient has preserved LV function by echo in February 2018.  Echocardiogram during this admission is pending.  We will review this to determine if there is been any interval change in her structural or valvular disease as well as the estimated right-sided pressures.  Patient's creatinine has increased significantly since diuresis.  We will discontinue aggressive IV diuresis and attempt gentle oral diuresis.  Need to follow renal function, urine output and weight  as well as symptoms.  Would continue with losartan and discontinue lasix   Atrial fibrillation-rate is controlled.  Will continue with metoprolol tartrate as long as ejection fraction remains above 30 and continue with anticoagulation with apixaban 5 mg twice daily.  Will follow renal function to determine if there is any indication for changing to lower dose.  Patient is 83 years of age however she is greater than 60 kg.  Creatinine is less than 1.5.  Reactive airway disease-patient is  wheezing.  Would agree with aggressive bronchodilators.   Signed: Teodoro Spray MD 10/21/2017, 9:02 AM Pager: [phone]

## 2017-10-21 NOTE — Progress Notes (Signed)
Family Meeting Note  Advance Directive:yes  Today a meeting took place with the Patient.   The following clinical team members were present during this meeting:MD  The following were discussed:Patient's diagnosis: acute hypoxic respiratory failure with CHF and COPD exacerbation, Patient's progosis: Unable to determine and Goals for treatment: DNR  Additional follow-up to be provided: dnr order written as per patient request  Time spent during discussion:16 minutes Royce Sciara, MD

## 2017-10-21 NOTE — Evaluation (Signed)
Physical Therapy Evaluation Patient Details Name: Tina [name] MRN: [id number] DOB: Sep 20, 1934 Today's Date: 10/21/2017   History of Present Illness  Pt is a pleasant 83y/o female who presented to ED with SOB, cough, and LE edema. Pt admitted for acute on chronic CHF and COPD exacerbations. Pt's PMH is significant for the following: CAD, CHF, bladder CA, COPD, asthma, HLD, HTN, osteoporis, CKD stage III.   Clinical Impression  Pt very willing to participate in PT today. Pt reported that she was IND in all ADLs and amb. Prior to hospitalization. Pt denied falls in the last six months but stated she was wobbly and did not use an AD. Pt does line dancing 2x/week. Pt was limited today 2/2 SOB and elevated HR. Pt was IND in bed mobility, required S for safety during STS txfs, and min guard to S during amb. Pt required one standing and one seated rest break during and after amb. 2/2 fatigue and SOB. Please see amb. Section for details. PT recommended OPPT at this time to improve endurance and balance, pt does not wish to have PT at d/c. Pt would benefit from skilled PT to address above deficits and promote optimal return to PLOF.    Follow Up Recommendations Outpatient PT    Equipment Recommendations  None recommended by PT    Recommendations for Other Services       Precautions / Restrictions Precautions Precautions: Fall Precaution Comments: pt refused bed alarm and stated nursing was letting pt get OOB to amb. to bathroom.  Restrictions Weight Bearing Restrictions: No      Mobility  Bed Mobility Overal bed mobility: Independent             General bed mobility comments: Pt demonstrated safe technique and no LOB noted during bed mobility.   Transfers Overall transfer level: Needs assistance   Transfers: Sit to/from Stand Sit to Stand: Supervision         General transfer comment: S to ensure safety as pt reported she was "wobbly" prior to hospitalization. SaO2 on 2L of O2  was 95% at rest and HR: in the 100s-112bpm.  Ambulation/Gait Ambulation/Gait assistance: Min guard;Supervision Ambulation Distance (Feet): 70 Feet Assistive device: None(and hallway rail) Gait Pattern/deviations: Step-through pattern;Decreased stride length;Wide base of support     General Gait Details: Pt utilized hallway rail to maintain balance and required rest break 2/2 SOB and PT assessed SaO2 on 3L of O2: 91-92% during amb. and HR: 115bpm (with one reading of 130bpm)  Stairs            Wheelchair Mobility    Modified Rankin (Stroke Patients Only)       Balance Overall balance assessment: Needs assistance Sitting-balance support: Feet unsupported;No upper extremity supported Sitting balance-Leahy Scale: Normal     Standing balance support: No upper extremity supported Standing balance-Leahy Scale: Fair Standing balance comment: Pt required UE support to perform turns.                             Pertinent Vitals/Pain Pain Assessment: No/denies pain    Home Living Family/patient expects to be discharged to:: Private residence Living Arrangements: Children(son-[place]) Available Help at Discharge: Family Type of Home: House Home Access: Stairs to enter Entrance Stairs-Rails: None Entrance Stairs-Number of Steps: 2 Home Layout: Two level Home Equipment: Cane - single point;Walker - 2 wheels;Bedside commode(rollator)      Prior Function Level of Independence: Independent  Comments: Indep with ADLs, household and community mobility. Pt goes line dancing 2x/week.     Hand Dominance        Extremity/Trunk Assessment   Upper Extremity Assessment Upper Extremity Assessment: Overall WFL for tasks assessed    Lower Extremity Assessment Lower Extremity Assessment: Overall WFL for tasks assessed       Communication   Communication: No difficulties  Cognition Arousal/Alertness: Awake/alert Behavior During Therapy: WFL for tasks  assessed/performed Overall Cognitive Status: Within Functional Limits for tasks assessed                                        General Comments      Exercises     Assessment/Plan    PT Assessment Patient needs continued PT services  PT Problem List Cardiopulmonary status limiting activity;Decreased knowledge of use of DME;Decreased balance;Decreased safety awareness;Decreased mobility       PT Treatment Interventions DME instruction;Gait training;Stair training;Functional mobility training;Therapeutic activities;Therapeutic exercise;Balance training;Neuromuscular re-education;Patient/family education    PT Goals (Current goals can be found in the Care Plan section)  Acute Rehab PT Goals Patient Stated Goal: to go home. PT Goal Formulation: With patient Time For Goal Achievement: 11/04/17 Potential to Achieve Goals: Good    Frequency Min 2X/week   Barriers to discharge        Co-evaluation               AM-PAC PT "6 Clicks" Daily Activity  Outcome Measure Difficulty turning over in bed (including adjusting bedclothes, sheets and blankets)?: None Difficulty moving from lying on back to sitting on the side of the bed? : None Difficulty sitting down on and standing up from a chair with arms (e.g., wheelchair, bedside commode, etc,.)?: A Little Help needed moving to and from a bed to chair (including a wheelchair)?: A Little Help needed walking in hospital room?: A Little Help needed climbing 3-5 steps with a railing? : A Little 6 Click Score: 20    End of Session Equipment Utilized During Treatment: Oxygen Activity Tolerance: Patient tolerated treatment well;Other (comment)(limited by SOB and elevated HR.) Patient left: in bed;with call bell/phone within reach;with family/visitor present Nurse Communication: Mobility status;Other (comment)(d/c) PT Visit Diagnosis: Unsteadiness on feet (R26.81);Other abnormalities of gait and mobility (R26.89)     Time: 2458-0998 PT Time Calculation (min) (ACUTE ONLY): 23 min   Charges:   PT Evaluation $PT Eval Low Complexity: 1 Low PT Treatments $Gait Training: 8-22 mins   PT G CodesGeoffry [name], PT,DPT 10/21/17 3:28 PM    Tina [name] L 10/21/2017, 3:25 PM

## 2017-10-21 NOTE — Progress Notes (Signed)
[facility] [place] NAME: Tina [name]    MR#:  [id number]  DATE OF BIRTH:  1934/10/22  SUBJECTIVE:   Patient still with shortness of breath. Edema improved  REVIEW OF SYSTEMS:    Review of Systems  Constitutional: Negative for fever, chills weight loss HENT: Negative for ear pain, nosebleeds, congestion, facial swelling, rhinorrhea, neck pain, neck stiffness and ear discharge.   Respiratory: Negative for cough, ++shortness of breath, NO wheezing  Cardiovascular: Negative for chest pain, palpitations and leg swelling.  Gastrointestinal: Negative for heartburn, abdominal pain, vomiting, diarrhea or consitpation Genitourinary: Negative for dysuria, urgency, frequency, hematuria Musculoskeletal: Negative for back pain or joint pain Neurological: Negative for dizziness, seizures, syncope, focal weakness,  numbness and headaches.  Hematological: Does not bruise/bleed easily.  Psychiatric/Behavioral: Negative for hallucinations, confusion, dysphoric mood    Tolerating Diet:yes      DRUG ALLERGIES:   Allergies  Allergen Reactions  . Evista [Raloxifene] Other (See Comments)    Night sweats   . Fluticasone-Salmeterol Other (See Comments)    Cough, "chokes me"     VITALS:  Blood pressure (!) 166/69, pulse (!) 101, temperature 98.7 F (37.1 C), temperature source Oral, resp. rate 18, weight 64.4 kg (142 lb), SpO2 98 %.  PHYSICAL EXAMINATION:  Constitutional: Appears well-developed and well-nourished. No distress. HENT: Normocephalic. Marland Kitchen Oropharynx is clear and moist.  Eyes: Conjunctivae and EOM are normal. PERRLA, no scleral icterus.  Neck: Normal ROM. Neck supple. No JVD. No tracheal deviation. CVS: RRR, S1/S2 +, no murmurs, no gallops, no carotid bruit.  Pulmonary: nml respiratory effort no audible wheezing or crackles  abdominal: Soft. BS +,  no distension, tenderness, rebound or guarding.  Musculoskeletal: Normal range of  motion. No edema and no tenderness.  Neuro: Alert. CN 2-12 grossly intact. No focal deficits. Skin: Right arm is streaky around the area of IV site Psychiatric: Normal mood and affect.      LABORATORY PANEL:   CBC Recent Labs  Lab 10/21/17 0331  WBC 12.6*  HGB 13.1  HCT 36.5  PLT 195   ------------------------------------------------------------------------------------------------------------------  Chemistries  Recent Labs  Lab 10/21/17 0331  NA 136  K 4.3  CL 101  CO2 21*  GLUCOSE 201*  BUN 33*  CREATININE 1.44*  CALCIUM 9.0  AST 43*  ALT 18  ALKPHOS 56  BILITOT 0.9   ------------------------------------------------------------------------------------------------------------------  Cardiac Enzymes Recent Labs  Lab 10/20/17 1413 10/20/17 2002 10/21/17 0331  TROPONINI <0.03 <0.03 <0.03   ------------------------------------------------------------------------------------------------------------------  RADIOLOGY:  Dg Chest 2 View  Result Date: 10/20/2017 CLINICAL DATA:  History of urinary bladder carcinoma. Cough and congestion. EXAM: CHEST - 2 VIEW COMPARISON:  Chest radiograph February 11, 2017 and chest CT September 07, 2017 FINDINGS: There are pleural effusions bilaterally. There is no appreciable edema or consolidation. There is bibasilar atelectasis. There is mild cardiomegaly with pulmonary venous hypertension. No adenopathy. There is aortic atherosclerosis. Port-A-Cath tip is in the superior vena cava. No pneumothorax. No blastic or lytic bone lesions are evident. IMPRESSION: Pulmonary vascular congestion. Pleural effusions bilaterally with bibasilar atelectasis. No consolidation. There is aortic atherosclerosis. No evident adenopathy. Port-A-Cath tip in superior vena cava. Aortic Atherosclerosis (ICD10-I70.0). Electronically Signed   By: Lowella Grip III M.D.   On: 10/20/2017 12:06   Ct Angio Chest Pe W/cm &/or Wo Cm  Result Date: 10/20/2017 CLINICAL  DATA:  Shortness of breath, cough, and hypoxia. High pretest probability for pulmonary embolism. Bladder carcinoma. EXAM: CT ANGIOGRAPHY  CHEST WITH CONTRAST TECHNIQUE: Multidetector CT imaging of the chest was performed using the standard protocol during bolus administration of intravenous contrast. Multiplanar CT image reconstructions and MIPs were obtained to evaluate the vascular anatomy. CONTRAST:  65mL ISOVUE-370 IOPAMIDOL (ISOVUE-370) INJECTION 76% COMPARISON:  09/07/2017 FINDINGS: Cardiovascular: Satisfactory opacification of pulmonary arteries noted, and no pulmonary emboli identified. No evidence of thoracic aortic dissection or aneurysm. Aortic and coronary artery atherosclerosis. Stable mild cardiomegaly. Mediastinum/Nodes: No masses or pathologically enlarged lymph nodes identified. Lungs/Pleura: New small bilateral pleural effusions and compressive atelectasis in both lung bases. Increased pulmonary interstitial prominence and ground-glass opacity, suspicious for mild edema. No evidence of pulmonary consolidation or central airway obstruction. Upper abdomen: No acute findings. Large left renal cyst again noted. Musculoskeletal: No suspicious bone lesions identified. Review of the MIP images confirms the above findings. IMPRESSION: No evidence of pulmonary embolism. New mild pulmonary interstitial prominence and ground-glass opacity, with small bilateral pleural effusions, suspicious for pulmonary edema/congestive heart failure. Aortic Atherosclerosis (ICD10-I70.0). Mild cardiomegaly and coronary artery calcification. Electronically Signed   By: Earle Gell M.D.   On: 10/20/2017 12:14     ASSESSMENT AND PLAN:   83 year old female with a history of CAD status post stent, PAF, COPD and chronic diastolic heart failure with preserved ejection fraction who presents with shortness of breath.   1.  Acute hypoxic respiratory failure in the setting of COPD exacerbation and CHF exacerbation Wean oxygen  as tolerated  2.  Acute on chronic diastolic heart failure with preserved ejection fraction: Follow-up on echocardiogram CHF clinic upon discharge Follow intake and output with daily weight and creatinine Patient appears to be euvolemic and therefore we will hold IV Lasix for today. Discussed with cardiology. Continue losartan  3.  Acute exacerbation of COPD: Add IV steroids and continue nebs  4.  PAF: Heart rate is controlled: Continue metoprolol and anticoagulation with Eliquis  5.  Chronic kidney disease stage III: Monitor creatinine as creatinine did increase today     PT evaluation for discharge planning  Management plans discussed with the patient and she is in agreement.  CODE STATUS: dnr  TOTAL TIME TAKING CARE OF THIS PATIENT: 30 minutes.     POSSIBLE D/C tomorrow, DEPENDING ON CLINICAL CONDITION.   Georgette Helmer M.D on 10/21/2017 at 10:57 AM  Between 7am to 6pm - Pager - [phone] After 6pm go to [email or web address] - password [email or web address] [facility]  Office  [phone]  CC: Primary care physician; Einar Pheasant, MD  Note: This dictation was prepared with Dragon dictation along with smaller phrase technology. Any transcriptional errors that result from this process are unintentional.

## 2017-10-22 LAB — BASIC METABOLIC PANEL
ANION GAP: 12 (ref 5–15)
BUN: 48 mg/dL — ABNORMAL HIGH (ref 6–20)
CALCIUM: 9.2 mg/dL (ref 8.9–10.3)
CO2: 20 mmol/L — ABNORMAL LOW (ref 22–32)
Chloride: 104 mmol/L (ref 101–111)
Creatinine, Ser: 1.35 mg/dL — ABNORMAL HIGH (ref 0.44–1.00)
GFR, EST AFRICAN AMERICAN: 42 mL/min — AB (ref >60)
GFR, EST NON AFRICAN AMERICAN: 36 mL/min — AB (ref >60)
Glucose, Bld: 207 mg/dL — ABNORMAL HIGH (ref 65–99)
Potassium: 5 mmol/L (ref 3.5–5.1)
Sodium: 136 mmol/L (ref 135–145)

## 2017-10-22 LAB — GLUCOSE, CAPILLARY: GLUCOSE-CAPILLARY: 207 mg/dL — AB (ref 65–99)

## 2017-10-22 MED ORDER — SODIUM CHLORIDE 0.9% FLUSH: 10.0000 mL | INTRAVENOUS | Status: DC | PRN

## 2017-10-22 MED ORDER — IPRATROPIUM BROMIDE 0.02 % IN SOLN
0.5000 mg | Freq: Three times a day (TID) | RESPIRATORY_TRACT | Status: DC
  Administered 2017-10-22: 0.5 mg via RESPIRATORY_TRACT
  Filled 2017-10-22: qty 2.5

## 2017-10-22 MED ORDER — ALBUTEROL SULFATE HFA 108 (90 BASE) MCG/ACT IN AERS: 2.0000 | INHALATION_SPRAY | Freq: Four times a day (QID) | RESPIRATORY_TRACT | 2 refills | Status: AC | PRN

## 2017-10-22 MED ORDER — LOVASTATIN 40 MG PO TABS: ORAL_TABLET | ORAL | 0 refills | Status: DC

## 2017-10-22 MED ORDER — POTASSIUM CHLORIDE CRYS ER 10 MEQ PO TBCR: 10.0000 meq | EXTENDED_RELEASE_TABLET | Freq: Every day | ORAL | 1 refills | Status: DC

## 2017-10-22 MED ORDER — LOSARTAN POTASSIUM 100 MG PO TABS: ORAL_TABLET | ORAL | 1 refills | Status: DC

## 2017-10-22 MED ORDER — IPRATROPIUM-ALBUTEROL 0.5-2.5 (3) MG/3ML IN SOLN
3.0000 mL | Freq: Three times a day (TID) | RESPIRATORY_TRACT | Status: DC
  Administered 2017-10-22: 3 mL via RESPIRATORY_TRACT
  Filled 2017-10-22: qty 3

## 2017-10-22 MED ORDER — HEPARIN SOD (PORK) LOCK FLUSH 100 UNIT/ML IV SOLN
500.0000 [IU] | Freq: Once | INTRAVENOUS | Status: AC
  Administered 2017-10-22: 500 [IU] via INTRAVENOUS
  Filled 2017-10-22: qty 5

## 2017-10-22 MED ORDER — AMLODIPINE BESYLATE 5 MG PO TABS: 5.0000 mg | ORAL_TABLET | Freq: Two times a day (BID) | ORAL | Status: DC

## 2017-10-22 MED ORDER — PREDNISONE 50 MG PO TABS: 50.0000 mg | ORAL_TABLET | Freq: Every day | ORAL | 0 refills | Status: AC

## 2017-10-22 MED ORDER — FUROSEMIDE 20 MG PO TABS: 20.0000 mg | ORAL_TABLET | Freq: Every day | ORAL | 11 refills | Status: DC

## 2017-10-22 MED ORDER — SODIUM CHLORIDE 0.9% FLUSH: 10.0000 mL | Freq: Two times a day (BID) | INTRAVENOUS | Status: DC

## 2017-10-22 MED ORDER — ALBUTEROL SULFATE (2.5 MG/3ML) 0.083% IN NEBU
2.5000 mg | INHALATION_SOLUTION | Freq: Three times a day (TID) | RESPIRATORY_TRACT | Status: DC
  Administered 2017-10-22: 2.5 mg via RESPIRATORY_TRACT
  Filled 2017-10-22: qty 3

## 2017-10-22 NOTE — Progress Notes (Signed)
Discharge instructions explained to pt and pts son/ verbalized an understanding/ iv/ chest port/ and tele removed/ RX given to pt/ will transport off unit via wheelchair.

## 2017-10-22 NOTE — Progress Notes (Signed)
Patient develops   SOB anytime she goes to the BR to urinate. Refuses to use the bedside commode. Patient  received PRN albuterol at 23:42 and just received scheduled duonebs for SOB. Breathing treatments were effective. Denied any acute pain. Will continue to monitor.

## 2017-10-22 NOTE — Care Management (Signed)
Discussed PT recommendations with patient. She declined and type of home health or outpatient PT. Patient states she lines dances 2 x per week. She mowes her yard and cleans her home. She is not interested in any type of help s/p discharge.

## 2017-10-22 NOTE — Progress Notes (Signed)
Medication adjustment: Patient expressed wanting to separate duoneb meds. Received ordered earlier from Dr Duane Boston ok to give as albuterol and as atrovent and mix the two meds together. Order entered initially but was discontinued in error by pharmacy. I have reentered order and clarified reasoning for the change from what normal practice is. Patient mixes these two meds together when she takes them at home and wishes to do so here.

## 2017-10-22 NOTE — Progress Notes (Addendum)
Cardiovascular and Pulmonary Nurse Navigator Note:   83 year old female with hx of CAD s/p stent, PAF, COPD, and chronic diastolic heart failure with preserved EF who presented to the ER with SOB.    Active Problem List this admission:  1. Acute hypoxic respiratory failure in the setting of COPD and CHF exacerbation.   2. Acute on Chronic diastolic HF with preserved EF.     EF was in the range of 60 to 65%.  Patient is being discharged on low-dose lasix and potassium.  Patient has CHF Clinic appt. 3. Acute exacerbation of COPD.  Patient on oral steroids and inhalers.   4. PAF -  Patient is being discharged on metoprolol and Eliquis. 5. CKD Stage III.    CHF Education:?? ? Patient sitting up on side of bed fully dressed working a crossword puzzle and listening to TV.  Patient informed this RN that she is being discharged today.    Provided patient with "Living Better with Heart Failure" packet. Briefly reviewed definition of heart failure and signs and symptoms of an exacerbation. Explained to patient that HF is a chronic illness which requires self-assessment / self-management along with help from the cardiologist/PCP/HF Clinic.?Patient informed this RN she knows a little about HF because her mother had HF and she lived to be 69 years old.   ?? *Reviewed importance of and reason behind checking weight daily in the AM, after using the bathroom, but before getting dressed.?Patient has scales.   ? Reviewed the following information with patient:  *Discussed when to call the Dr= weight gain of >2-3lb overnight of 5lb in a week,  *Discussed yellow zone= call MD: weight gain of >2-3lb overnight of 5lb in a week, increased swelling, increased SOB when lying down, chest discomfort, dizziness, increased fatigue *Red Zone= call 911: struggle to breath, fainting or near fainting, significant chest pain   *Reviewed low sodium diet-provided handout of recommended and not recommended foods. ?Reviewed  reading labels with patient. Discussed fluid intake with patient as well. Handouts:  A list of the sodium content of foods as well heart failure nutritional therapy / guidelines given and reviewed with patient.   Current Fluid Restriction: Patient instructed to drink no more than 1500 ml per day. Demonstrated by using bedside water pitcher. ? ? *Smoking Cessation - Patient is a FORMER smoker.    *Activity / Exercise - Physical Therapy has evaluated patient and recommended outpatient PT.  Patient has refused this.  Patient reports she is very active, drives, does all her housework, line dances 2 x / week.   Encouraged patient to remain as physically active as possible.   ? *[facility]- Explained the role of the [facility]. ?Explained to patient the HF Clinic does not replace his PCP/cardiologist, but is an additional resource to help her manage her HF and to keep her out of the hospital. Appointment in the [facility] is scheduled for 11/16/2017 at 8:40 a.m. ? ? Again, the 5 Steps to Living Better with Heart Failure were reviewed with patient. ?Patient thanked me for providing the above information. ? ? Roanna Epley, RN, BSN, [facility] Cardiovascular and Pulmonary Nurse Navigator

## 2017-10-22 NOTE — Progress Notes (Signed)
SATURATION QUALIFICATIONS: (This note is used to comply with regulatory documentation for home oxygen)  Patient Saturations on Room Air at Rest = 95%  Patient Saturations on Room Air while Ambulating = 92%  Patient Saturations on  Liters of oxygen while Ambulating = %  Please briefly explain why patient needs home oxygen: 

## 2017-10-22 NOTE — Discharge Summary (Signed)
[facility] NAME: Tina [name]    MR#:  [id number]  DATE OF BIRTH:  12-Mar-1935  DATE OF ADMISSION:  10/20/2017 ADMITTING PHYSICIAN: Idelle Crouch, MD  DATE OF DISCHARGE: 10/22/2017  PRIMARY CARE PHYSICIAN: Einar Pheasant, MD    ADMISSION DIAGNOSIS:  Acute heart failure, unspecified heart failure type ([place]) [I50.9]  DISCHARGE DIAGNOSIS:  Principal Problem:   CHF (congestive heart failure) ([place]) Active Problems:   CAD (coronary artery disease)   CKD (chronic kidney disease), stage III (H[facility])   Acute respiratory distress   SECONDARY DIAGNOSIS:   Past Medical History:  Diagnosis Date  . Anemia   . Asthma   . Bladder cancer ([place])   . BRCA positive   . CAD (coronary artery disease)   . COPD (chronic obstructive pulmonary disease) ([place])   . Emphysema lung ([place])   . GERD (gastroesophageal reflux disease)   . History of colon polyps   . Hypercholesterolemia   . Hyperglycemia   . Hypertension   . Lung nodules   . Osteoporosis   . Personal history of tobacco use, presenting hazards to health 11/25/2014  . Polycythemia vera(238.4)   . Renal cyst   . Skin cancer     HOSPITAL COURSE:   83 year old female with a history of CAD status post stent, PAF, COPD and chronic diastolic heart failure with preserved ejection fraction who presents with shortness of breath.   1.  Acute hypoxic respiratory failure in the setting of COPD exacerbation and CHF exacerbation Patient has been weaned.  2.  Acute on chronic diastolic heart failure with preserved ejection fraction: Echo shows   left ventricle: Wall thickness was increased in a pattern of mild   LVH. Systolic function was normal. The estimated ejection   fraction was in the range of 60% to 65%. - Mitral valve: There was mild regurgitation. - Right atrium: The atrium was mildly dilated. She is diuresed.  She will be discharged on oral low-dose Lasix as well as potassium  supplementation.  She is referred to CHF clinic upon discharge  3.  Acute exacerbation of COPD: Patient will be discharged on oral steroids.  She will continue inhalers.  4.  PAF: Her heart rate is controlled: She will continue metoprolol and anticoagulation with Eliquis  5.  Chronic kidney disease stage III: Creatinine is at baseline    DISCHARGE CONDITIONS AND DIET:   Stable for discharge on heart healthy diet  CONSULTS OBTAINED:  Treatment Team:  Teodoro Spray, MD  DRUG ALLERGIES:   Allergies  Allergen Reactions  . Evista [Raloxifene] Other (See Comments)    Night sweats   . Fluticasone-Salmeterol Other (See Comments)    Cough, "chokes me"     DISCHARGE MEDICATIONS:   Allergies as of 10/22/2017      Reactions   Evista [raloxifene] Other (See Comments)   Night sweats    Fluticasone-salmeterol Other (See Comments)   Cough, "chokes me"       Medication List    TAKE these medications   albuterol 108 (90 Base) MCG/ACT inhaler Commonly known as:  PROVENTIL HFA;VENTOLIN HFA Inhale 2 puffs into the lungs every 6 (six) hours as needed for wheezing or shortness of breath.   amLODipine 5 MG tablet Commonly known as:  NORVASC Take 1 tablet (5 mg total) by mouth 2 (two) times daily.   cholecalciferol 1000 units tablet Commonly known as:  VITAMIN D Take 1,000 Units by mouth daily.  ELIQUIS 5 MG Tabs tablet Generic drug:  apixaban TAKE 1 TABLET BY MOUTH TWO  TIMES DAILY   fluticasone 50 MCG/ACT nasal spray Commonly known as:  FLONASE Place 2 sprays into both nostrils daily as needed for allergies.   furosemide 20 MG tablet Commonly known as:  LASIX Take 1 tablet (20 mg total) by mouth daily.   ipratropium-albuterol 0.5-2.5 (3) MG/3ML Soln Commonly known as:  DUONEB Take 3 mLs by nebulization every 6 (six) hours as needed (for shortness of breath).   losartan 100 MG tablet Commonly known as:  COZAAR TAKE 126m BY MOUTH  DAILY What changed:    how much  to take  how to take this  when to take this  additional instructions   lovastatin 40 MG tablet Commonly known as:  MEVACOR TAKE 1 TABLET BY MOUTH  DAILY (evening) What changed:    how much to take  how to take this  when to take this  additional instructions   metoprolol tartrate 25 MG tablet Commonly known as:  LOPRESSOR Take 1 tablet (25 mg total) by mouth 2 (two) times daily.   MYRBETRIQ 25 MG Tb24 tablet Generic drug:  mirabegron ER Take 1 tablet by mouth daily.   potassium chloride 10 MEQ tablet Commonly known as:  K-DUR,KLOR-CON Take 1 tablet (10 mEq total) by mouth daily.   predniSONE 50 MG tablet Commonly known as:  DELTASONE Take 1 tablet (50 mg total) by mouth daily with breakfast for 3 days.         Today   CHIEF COMPLAINT:   Patient doing 100% better this morning   VITAL SIGNS:  Blood pressure (!) 161/60, pulse 75, temperature 97.8 F (36.6 C), temperature source Oral, resp. rate 18, weight 65 kg (143 lb 3.2 oz), SpO2 94 %.   REVIEW OF SYSTEMS:  Review of Systems  Constitutional: Negative.  Negative for chills, fever and malaise/fatigue.  HENT: Negative.  Negative for ear discharge, ear pain, hearing loss, nosebleeds and sore throat.   Eyes: Negative.  Negative for blurred vision and pain.  Respiratory: Negative.  Negative for cough, hemoptysis, shortness of breath and wheezing.   Cardiovascular: Negative.  Negative for chest pain, palpitations and leg swelling.  Gastrointestinal: Negative.  Negative for abdominal pain, blood in stool, diarrhea, nausea and vomiting.  Genitourinary: Negative.  Negative for dysuria.  Musculoskeletal: Negative.  Negative for back pain.  Skin: Negative.   Neurological: Negative for dizziness, tremors, speech change, focal weakness, seizures and headaches.  Endo/Heme/Allergies: Negative.  Does not bruise/bleed easily.  Psychiatric/Behavioral: Negative.  Negative for depression, hallucinations and suicidal  ideas.     PHYSICAL EXAMINATION:  GENERAL:  83y.o.-year-old patient lying in the bed with no acute distress.  NECK:  Supple, no jugular venous distention. No thyroid enlargement, no tenderness.  LUNGS: Normal breath sounds bilaterally, no wheezing, rales,rhonchi  No use of accessory muscles of respiration.  CARDIOVASCULAR: S1, S2 normal. No murmurs, rubs, or gallops.  ABDOMEN: Soft, non-tender, non-distended. Bowel sounds present. No organomegaly or mass.  EXTREMITIES: No pedal edema, cyanosis, or clubbing.  PSYCHIATRIC: The patient is alert and oriented x 3.  SKIN: No obvious rash, lesion, or ulcer.   DATA REVIEW:   CBC Recent Labs  Lab 10/21/17 0331  WBC 12.6*  HGB 13.1  HCT 36.5  PLT 195    Chemistries  Recent Labs  Lab 10/21/17 0331 10/22/17 0429  NA 136 136  K 4.3 5.0  CL 101 104  CO2 21*  20*  GLUCOSE 201* 207*  BUN 33* 48*  CREATININE 1.44* 1.35*  CALCIUM 9.0 9.2  AST 43*  --   ALT 18  --   ALKPHOS 56  --   BILITOT 0.9  --     Cardiac Enzymes Recent Labs  Lab 10/20/17 1413 10/20/17 2002 10/21/17 0331  TROPONINI <0.03 <0.03 <0.03    Microbiology Results  '@[email or web address]' @  RADIOLOGY:  Dg Chest 2 View  Result Date: 10/20/2017 CLINICAL DATA:  History of urinary bladder carcinoma. Cough and congestion. EXAM: CHEST - 2 VIEW COMPARISON:  Chest radiograph February 11, 2017 and chest CT September 07, 2017 FINDINGS: There are pleural effusions bilaterally. There is no appreciable edema or consolidation. There is bibasilar atelectasis. There is mild cardiomegaly with pulmonary venous hypertension. No adenopathy. There is aortic atherosclerosis. Port-A-Cath tip is in the superior vena cava. No pneumothorax. No blastic or lytic bone lesions are evident. IMPRESSION: Pulmonary vascular congestion. Pleural effusions bilaterally with bibasilar atelectasis. No consolidation. There is aortic atherosclerosis. No evident adenopathy. Port-A-Cath tip in superior vena cava. Aortic  Atherosclerosis (ICD10-I70.0). Electronically Signed   By: Lowella Grip III M.D.   On: 10/20/2017 12:06   Ct Angio Chest Pe W/cm &/or Wo Cm  Result Date: 10/20/2017 CLINICAL DATA:  Shortness of breath, cough, and hypoxia. High pretest probability for pulmonary embolism. Bladder carcinoma. EXAM: CT ANGIOGRAPHY CHEST WITH CONTRAST TECHNIQUE: Multidetector CT imaging of the chest was performed using the standard protocol during bolus administration of intravenous contrast. Multiplanar CT image reconstructions and MIPs were obtained to evaluate the vascular anatomy. CONTRAST:  52m ISOVUE-370 IOPAMIDOL (ISOVUE-370) INJECTION 76% COMPARISON:  09/07/2017 FINDINGS: Cardiovascular: Satisfactory opacification of pulmonary arteries noted, and no pulmonary emboli identified. No evidence of thoracic aortic dissection or aneurysm. Aortic and coronary artery atherosclerosis. Stable mild cardiomegaly. Mediastinum/Nodes: No masses or pathologically enlarged lymph nodes identified. Lungs/Pleura: New small bilateral pleural effusions and compressive atelectasis in both lung bases. Increased pulmonary interstitial prominence and ground-glass opacity, suspicious for mild edema. No evidence of pulmonary consolidation or central airway obstruction. Upper abdomen: No acute findings. Large left renal cyst again noted. Musculoskeletal: No suspicious bone lesions identified. Review of the MIP images confirms the above findings. IMPRESSION: No evidence of pulmonary embolism. New mild pulmonary interstitial prominence and ground-glass opacity, with small bilateral pleural effusions, suspicious for pulmonary edema/congestive heart failure. Aortic Atherosclerosis (ICD10-I70.0). Mild cardiomegaly and coronary artery calcification. Electronically Signed   By: JEarle GellM.D.   On: 10/20/2017 12:14      Allergies as of 10/22/2017      Reactions   Evista [raloxifene] Other (See Comments)   Night sweats    Fluticasone-salmeterol  Other (See Comments)   Cough, "chokes me"       Medication List    TAKE these medications   albuterol 108 (90 Base) MCG/ACT inhaler Commonly known as:  PROVENTIL HFA;VENTOLIN HFA Inhale 2 puffs into the lungs every 6 (six) hours as needed for wheezing or shortness of breath.   amLODipine 5 MG tablet Commonly known as:  NORVASC Take 1 tablet (5 mg total) by mouth 2 (two) times daily.   cholecalciferol 1000 units tablet Commonly known as:  VITAMIN D Take 1,000 Units by mouth daily.   ELIQUIS 5 MG Tabs tablet Generic drug:  apixaban TAKE 1 TABLET BY MOUTH TWO  TIMES DAILY   fluticasone 50 MCG/ACT nasal spray Commonly known as:  FLONASE Place 2 sprays into both nostrils daily as needed for allergies.   furosemide 20  MG tablet Commonly known as:  LASIX Take 1 tablet (20 mg total) by mouth daily.   ipratropium-albuterol 0.5-2.5 (3) MG/3ML Soln Commonly known as:  DUONEB Take 3 mLs by nebulization every 6 (six) hours as needed (for shortness of breath).   losartan 100 MG tablet Commonly known as:  COZAAR TAKE 172m BY MOUTH  DAILY What changed:    how much to take  how to take this  when to take this  additional instructions   lovastatin 40 MG tablet Commonly known as:  MEVACOR TAKE 1 TABLET BY MOUTH  DAILY (evening) What changed:    how much to take  how to take this  when to take this  additional instructions   metoprolol tartrate 25 MG tablet Commonly known as:  LOPRESSOR Take 1 tablet (25 mg total) by mouth 2 (two) times daily.   MYRBETRIQ 25 MG Tb24 tablet Generic drug:  mirabegron ER Take 1 tablet by mouth daily.   potassium chloride 10 MEQ tablet Commonly known as:  K-DUR,KLOR-CON Take 1 tablet (10 mEq total) by mouth daily.   predniSONE 50 MG tablet Commonly known as:  DELTASONE Take 1 tablet (50 mg total) by mouth daily with breakfast for 3 days.         Management plans discussed with the patient and she is in agreement. Stable for  discharge home Patient should follow up with pcp  CODE STATUS:     Code Status Orders  (From admission, onward)        Start     Ordered   10/21/17 1102  Do not attempt resuscitation (DNR)  Continuous    Question Answer Comment  In the event of cardiac or respiratory ARREST Do not call a "code blue"   In the event of cardiac or respiratory ARREST Do not perform Intubation, CPR, defibrillation or ACLS   In the event of cardiac or respiratory ARREST Use medication by any route, position, wound care, and other measures to relive pain and suffering. May use oxygen, suction and manual treatment of airway obstruction as needed for comfort.      10/21/17 1101    Code Status History    Date Active Date Inactive Code Status Order ID Comments User Context   10/20/2017 1405 10/21/2017 1101 Full Code [id number] SIdelle Crouch MD Inpatient   02/11/2017 2018 02/15/2017 1534 DNR [id number] PFritzi Mandes MD Inpatient   08/21/2016 2204 08/23/2016 1511 Full Code [id number] SHenreitta Leber MD Inpatient   06/14/2016 2028 06/17/2016 1621 Full Code [id number] VTheodoro Grist MD Inpatient   11/14/2014 0857 11/15/2014 1557 Full Code [id number] WAldean Jewett MD Inpatient      TOTAL TIME TAKING CARE OF THIS PATIENT: 38 minutes.    Note: This dictation was prepared with Dragon dictation along with smaller phrase technology. Any transcriptional errors that result from this process are unintentional.  Rush Salce M.D on 10/22/2017 at 8:52 AM  Between 7am to 6pm - Pager - [phone] After 6pm go to [email or web address] - password [email or web address]  Office  [phone] CC: Primary care physician; SEinar Pheasant MD

## 2017-10-23 ENCOUNTER — Telehealth: Payer: Self-pay | Admitting: Internal Medicine

## 2017-10-23 NOTE — Telephone Encounter (Signed)
They put her on lasix and added potassium to this.  Can remain on until I see her.  We will recheck.

## 2017-10-23 NOTE — Telephone Encounter (Signed)
Please advise 

## 2017-10-23 NOTE — Telephone Encounter (Signed)
Copied from [place] [id number]. Topic: General - Other >> Oct 23, 2017  8:18 AM Lennox Solders wrote: Reason for CRM: pt potassium was low in hospital and she would like to know if she should continue to take until she sees dr Nicki Reaper on 10-26-17

## 2017-10-23 NOTE — Telephone Encounter (Signed)
Patient is aware 

## 2017-10-23 NOTE — Telephone Encounter (Signed)
Pt has appt with you on 6/14. [place] for patient to continue potassium until her visit with you?

## 2017-10-24 ENCOUNTER — Telehealth: Payer: Self-pay | Admitting: Internal Medicine

## 2017-10-24 NOTE — Telephone Encounter (Signed)
Transition Care Management Follow-up Telephone Call  How have you been since you were released from the hospital? Patient stated she is feeling better, still has some congestion. Patient stated she has lost a lot of swelling.   Do you understand why you were in the hospital? yes   Do you understand the discharge instrcutions? yes  Items Reviewed:  Medications reviewed: yes  Allergies reviewed: yes  Dietary changes reviewed: yes  Referrals reviewed: yes   Functional Questionnaire:   Activities of Daily Living (ADLs):   She states they are independent in the following: ambulation, bathing and hygiene, feeding, continence, grooming, toileting and dressing States they require assistance with the following: Patien stated she needs no assistance at this time.   Any transportation issues/concerns?: no   Any patient concerns? no   Confirmed importance and date/time of follow-up visits scheduled: yes   Confirmed with patient if condition begins to worsen call PCP or go to the ER.  Patient was given the Call-a-Nurse line [phone]: yes

## 2017-10-25 NOTE — Telephone Encounter (Signed)
Patient aware and will take lasix

## 2017-10-25 NOTE — Telephone Encounter (Signed)
She had called about continuing her potassium.  After reviewing all of her information from her hospitalization, I would like for her to hold her potassium until I recheck at her appt.

## 2017-10-25 NOTE — Telephone Encounter (Signed)
Patient was notified yesterday about potassium and lasix  and told to hold until she sees you tomorrow.

## 2017-10-25 NOTE — Telephone Encounter (Signed)
I did not want her to hold lasix.  Just potassium.

## 2017-10-26 ENCOUNTER — Ambulatory Visit (INDEPENDENT_AMBULATORY_CARE_PROVIDER_SITE_OTHER): Payer: Medicare Other | Admitting: Internal Medicine

## 2017-10-26 ENCOUNTER — Encounter: Payer: Self-pay | Admitting: Internal Medicine

## 2017-10-26 VITALS — BP 158/82 | HR 86 | Temp 98.6°F | Resp 18 | Wt 141.6 lb

## 2017-10-26 DIAGNOSIS — J449 Chronic obstructive pulmonary disease, unspecified: Secondary | ICD-10-CM

## 2017-10-26 DIAGNOSIS — I2510 Atherosclerotic heart disease of native coronary artery without angina pectoris: Secondary | ICD-10-CM | POA: Diagnosis not present

## 2017-10-26 DIAGNOSIS — N183 Chronic kidney disease, stage 3 unspecified: Secondary | ICD-10-CM

## 2017-10-26 DIAGNOSIS — J9601 Acute respiratory failure with hypoxia: Secondary | ICD-10-CM

## 2017-10-26 DIAGNOSIS — I10 Essential (primary) hypertension: Secondary | ICD-10-CM | POA: Diagnosis not present

## 2017-10-26 DIAGNOSIS — I509 Heart failure, unspecified: Secondary | ICD-10-CM | POA: Diagnosis not present

## 2017-10-26 LAB — BASIC METABOLIC PANEL
BUN: 30 mg/dL — AB (ref 6–23)
CALCIUM: 9.5 mg/dL (ref 8.4–10.5)
CO2: 33 mEq/L — ABNORMAL HIGH (ref 19–32)
CREATININE: 1.09 mg/dL (ref 0.40–1.20)
Chloride: 102 mEq/L (ref 96–112)
GFR: 51.22 mL/min — AB (ref >60.00)
GLUCOSE: 115 mg/dL — AB (ref 70–99)
Potassium: 3.2 mEq/L — ABNORMAL LOW (ref 3.5–5.1)
Sodium: 142 mEq/L (ref 135–145)

## 2017-10-26 MED ORDER — PREDNISONE 10 MG PO TABS: ORAL_TABLET | ORAL | 0 refills | Status: DC

## 2017-10-26 NOTE — Progress Notes (Signed)
[name], female   DOB: [birth year], 83 y.o.   MRN: [id number]   Subjective:    [name], female    DOB: 1934-07-18, 83 y.o.   MRN: [id number]  HPI  Patient here for hospital follow up. She was admitted 10/20/17 and diagnosed with acute hypoxic respiratory failure in the setting of COPD exacerbation and CHF exacerbation.  She was diuresed and discharged on lasix and potassium.  Also discharged on short course of steroids.  Continues using her inhalers.  She reports she feels better.  Still with some increased cough.  Feels she needs a longer course of steroids.  No acid reflux.  Eating.  No abdominal pain.  Bowels moving.  Feet and lower extremity swelling - improved.  Due to see Dr Raul Del 10/29/17.  Has f/u with Dr Saralyn Pilar - next week.    Past Medical History:  Diagnosis Date  . Anemia   . Asthma   . Bladder cancer ([place])   . BRCA positive   . CAD (coronary artery disease)   . COPD (chronic obstructive pulmonary disease) ([place])   . Emphysema lung ([place])   . GERD (gastroesophageal reflux disease)   . History of colon polyps   . Hypercholesterolemia   . Hyperglycemia   . Hypertension   . Lung nodules   . Osteoporosis   . Personal history of tobacco use, presenting hazards to health 11/25/2014  . Polycythemia vera(238.4)   . Renal cyst   . Skin cancer    Past Surgical History:  Procedure Laterality Date  . ANGIOPLASTY     coronary (x1)  . COLONOSCOPY WITH PROPOFOL N/A 03/02/2015   Procedure: COLONOSCOPY WITH PROPOFOL;  Surgeon: Lollie Sails, MD;  Location: [facility] ENDOSCOPY;  Service: Endoscopy;  Laterality: N/A;  . COLONOSCOPY WITH PROPOFOL N/A 02/13/2017   Procedure: COLONOSCOPY WITH PROPOFOL;  Surgeon: [name], MD;  Location: [facility] ENDOSCOPY;  Service: Endoscopy;  Laterality: N/A;  . CORONARY ANGIOPLASTY    . CORONARY ARTERY BYPASS GRAFT  09/17/2017   pt denies  . CYSTOSCOPY W/ RETROGRADES Bilateral 02/13/2017   Procedure: CYSTOSCOPY WITH  RETROGRADE PYELOGRAM;  Surgeon: Hollice Espy, MD;  Location: [facility] ORS;  Service: Urology;  Laterality: Bilateral;  . CYSTOSCOPY W/ RETROGRADES Bilateral 09/19/2017   Procedure: CYSTOSCOPY WITH RETROGRADE PYELOGRAM;  Surgeon: Hollice Espy, MD;  Location: [facility] ORS;  Service: Urology;  Laterality: Bilateral;  . CYSTOSCOPY WITH BIOPSY  03/26/2017   Procedure: CYSTOSCOPY WITH BIOPSY;  Surgeon: Hollice Espy, MD;  Location: [facility] ORS;  Service: Urology;;  . Consuela Mimes WITH STENT PLACEMENT Bilateral 02/13/2017   Procedure: CYSTOSCOPY WITH STENT PLACEMENT;  Surgeon: Hollice Espy, MD;  Location: [facility] ORS;  Service: Urology;  Laterality: Bilateral;  . CYSTOSCOPY WITH STENT PLACEMENT Bilateral 03/26/2017   Procedure: CYSTOSCOPY WITH STENT EXCHANGE;  Surgeon: Hollice Espy, MD;  Location: [facility] ORS;  Service: Urology;  Laterality: Bilateral;  . ESOPHAGOGASTRODUODENOSCOPY (EGD) WITH PROPOFOL N/A 02/13/2017   Procedure: ESOPHAGOGASTRODUODENOSCOPY (EGD) WITH PROPOFOL;  Surgeon: [name], MD;  Location[facility] ENDOSCOPY;  Service: Endoscopy;  Laterality: N/A;  . PORTA CATH INSERTION N/A 02/28/2017   Procedure: PORTA CATH INSERTION;  Surgeon: Algernon Huxley, MD;  Location: [facility] CV LAB;  Service: Cardiovascular;  Laterality: N/A;  . SALPINGOOPHORECTOMY    . TONSILECTOMY/ADENOIDECTOMY WITH MYRINGOTOMY    . TONSILLECTOMY    . TRANSURETHRAL RESECTION OF BLADDER TUMOR N/A 02/13/2017   Procedure: TRANSURETHRAL RESECTION OF BLADDER TUMOR (TURBT);  Surgeon: Hollice Espy, MD;  Location: [facility] ORS;  Service: Urology;  Laterality: N/A;  . TUBAL LIGATION    . UPPER GI ENDOSCOPY  02/13/2017  . URETEROSCOPY Left 09/19/2017   Procedure: URETEROSCOPY;  Surgeon: Hollice Espy, MD;  Location: [facility] ORS;  Service: Urology;  Laterality: Left;  Marland Kitchen VISCERAL ARTERY INTERVENTION N/A 02/12/2017   Procedure: VISCERAL ARTERY INTERVENTION;  Surgeon: Algernon Huxley, MD;  Location: [place] CV LAB;  Service:  Cardiovascular;  Laterality: N/A;   Family History  Problem Relation Age of Onset  . Cirrhosis Father        died age 30  . Alcohol abuse Father   . Asthma Mother   . Congestive Heart Failure Mother   . Breast cancer Mother        2 (1/2 sisters)  . Osteoarthritis Mother   . Colon cancer Mother   . Lupus Sister   . Alcohol abuse Sister   . Ovarian cancer Sister   . Osteoporosis Sister   . Skin cancer Sister   . Breast cancer Cousin   . Breast cancer Maternal Aunt    Social History   Socioeconomic History  . Marital status: Widowed    Spouse name: Not on file  . Number of children: 2  . Years of education: Not on file  . Highest education level: Not on file  Occupational History  . Not on file  Social Needs  . Financial resource strain: Not hard at all  . Food insecurity:    Worry: Never true    Inability: Never true  . Transportation needs:    Medical: No    Non-medical: No  Tobacco Use  . Smoking status: Former Smoker    Packs/day: 1.00    Years: 45.00    Pack years: 45.00    Types: Cigarettes    Last attempt to quit: 11/11/2003    Years since quitting: 13.9  . Smokeless tobacco: Never Used  Substance and Sexual Activity  . Alcohol use: No    Alcohol/week: 0.0 oz    Comment: occasional  . Drug use: No  . Sexual activity: Never  Lifestyle  . Physical activity:    Days per week: 2 days    Minutes per session: 90 min  . Stress: Not at all  Relationships  . Social connections:    Talks on phone: Not on file    Gets together: Not on file    Attends religious service: Not on file    Active member of club or organization: Not on file    Attends meetings of clubs or organizations: Not on file    Relationship status: Not on file  Other Topics Concern  . Not on file  Social History Narrative  . Not on file    Outpatient Encounter Medications as of 10/26/2017  Medication Sig  . albuterol (PROVENTIL HFA;VENTOLIN HFA) 108 (90 Base) MCG/ACT inhaler Inhale 2  puffs into the lungs every 6 (six) hours as needed for wheezing or shortness of breath.  Marland Kitchen amLODipine (NORVASC) 5 MG tablet Take 1 tablet (5 mg total) by mouth 2 (two) times daily.  . cholecalciferol (VITAMIN D) 1000 units tablet Take 1,000 Units by mouth daily.  Marland Kitchen ELIQUIS 5 MG TABS tablet TAKE 1 TABLET BY MOUTH TWO  TIMES DAILY  . fluticasone (FLONASE) 50 MCG/ACT nasal spray Place 2 sprays into both nostrils daily as needed for allergies.  . furosemide (LASIX) 20 MG tablet Take 1 tablet (20 mg total) by mouth daily.  Marland Kitchen  ipratropium-albuterol (DUONEB) 0.5-2.5 (3) MG/3ML SOLN Take 3 mLs by nebulization every 6 (six) hours as needed (for shortness of breath).  . losartan (COZAAR) 100 MG tablet TAKE 126m BY MOUTH  DAILY  . lovastatin (MEVACOR) 40 MG tablet TAKE 1 TABLET BY MOUTH  DAILY (evening)  . metoprolol tartrate (LOPRESSOR) 25 MG tablet Take 1 tablet (25 mg total) by mouth 2 (two) times daily.  .Marland KitchenMYRBETRIQ 25 MG TB24 tablet Take 1 tablet by mouth daily.  . potassium chloride (K-DUR,KLOR-CON) 10 MEQ tablet Take 1 tablet (10 mEq total) by mouth daily.  . predniSONE (DELTASONE) 10 MG tablet Take 4 tablets x 1 day and then decrease by 1/2 tablet per day until down to zero mg.   No facility-administered encounter medications on file as of 10/26/2017.     Review of Systems  Constitutional: Negative for appetite change and unexpected weight change.  HENT: Positive for congestion. Negative for sinus pressure.   Respiratory: Positive for cough. Negative for chest tightness.        Still some sob.  Improved.    Cardiovascular: Positive for leg swelling. Negative for chest pain and palpitations.       Leg swelling improved.    Gastrointestinal: Negative for abdominal pain, diarrhea, nausea and vomiting.  Genitourinary: Negative for difficulty urinating and dysuria.  Musculoskeletal: Negative for joint swelling and myalgias.  Skin: Negative for color change and rash.  Neurological: Negative for  dizziness, light-headedness and headaches.  Psychiatric/Behavioral: Negative for agitation and dysphoric mood.       Objective:    Physical Exam  Constitutional: She appears well-developed and well-nourished. No distress.  HENT:  Nose: Nose normal.  Mouth/Throat: Oropharynx is clear and moist.  Neck: Neck supple. No thyromegaly present.  Cardiovascular: Normal rate and regular rhythm.  Pulmonary/Chest: No respiratory distress.  Increased cough with forced expiration.    Abdominal: Soft. Bowel sounds are normal. There is no tenderness.  Musculoskeletal: She exhibits no tenderness.  Pedal edema.    Lymphadenopathy:    She has no cervical adenopathy.  Skin: No rash noted. No erythema.  Psychiatric: She has a normal mood and affect. Her behavior is normal.    BP (!) 158/82 (BP Location: Left Arm, Patient Position: Sitting, Cuff Size: Normal)   Pulse 86   Temp 98.6 F (37 C) (Oral)   Resp 18   Wt 141 lb 9.6 oz (64.2 kg)   SpO2 98%   BMI 23.56 kg/m  Wt Readings from Last 3 Encounters:  10/26/17 141 lb 9.6 oz (64.2 kg)  10/22/17 143 lb 3.2 oz (65 kg)  10/15/17 139 lb 6.4 oz (63.2 kg)     Lab Results  Component Value Date   WBC 12.6 (H) 10/21/2017   HGB 13.1 10/21/2017   HCT 36.5 10/21/2017   PLT 195 10/21/2017   GLUCOSE 115 (H) 10/26/2017   CHOL 158 02/16/2016   TRIG 86.0 02/16/2016   HDL 60.10 02/16/2016   LDLCALC 81 02/16/2016   ALT 18 10/21/2017   AST 43 (H) 10/21/2017   NA 142 10/26/2017   K 3.2 (L) 10/26/2017   CL 102 10/26/2017   CREATININE 1.09 10/26/2017   BUN 30 (H) 10/26/2017   CO2 33 (H) 10/26/2017   TSH 1.61 02/08/2017   HGBA1C 5.1 05/25/2017    Dg Chest 2 View  Result Date: 10/20/2017 CLINICAL DATA:  History of urinary bladder carcinoma. Cough and congestion. EXAM: CHEST - 2 VIEW COMPARISON:  Chest radiograph February 11, 2017 and  chest CT September 07, 2017 FINDINGS: There are pleural effusions bilaterally. There is no appreciable edema or  consolidation. There is bibasilar atelectasis. There is mild cardiomegaly with pulmonary venous hypertension. No adenopathy. There is aortic atherosclerosis. Port-A-Cath tip is in the superior vena cava. No pneumothorax. No blastic or lytic bone lesions are evident. IMPRESSION: Pulmonary vascular congestion. Pleural effusions bilaterally with bibasilar atelectasis. No consolidation. There is aortic atherosclerosis. No evident adenopathy. Port-A-Cath tip in superior vena cava. Aortic Atherosclerosis (ICD10-I70.0). Electronically Signed   By: Lowella Grip III M.D.   On: 10/20/2017 12:06   Ct Angio Chest Pe W/cm &/or Wo Cm  Result Date: 10/20/2017 CLINICAL DATA:  Shortness of breath, cough, and hypoxia. High pretest probability for pulmonary embolism. Bladder carcinoma. EXAM: CT ANGIOGRAPHY CHEST WITH CONTRAST TECHNIQUE: Multidetector CT imaging of the chest was performed using the standard protocol during bolus administration of intravenous contrast. Multiplanar CT image reconstructions and MIPs were obtained to evaluate the vascular anatomy. CONTRAST:  36m ISOVUE-370 IOPAMIDOL (ISOVUE-370) INJECTION 76% COMPARISON:  09/07/2017 FINDINGS: Cardiovascular: Satisfactory opacification of pulmonary arteries noted, and no pulmonary emboli identified. No evidence of thoracic aortic dissection or aneurysm. Aortic and coronary artery atherosclerosis. Stable mild cardiomegaly. Mediastinum/Nodes: No masses or pathologically enlarged lymph nodes identified. Lungs/Pleura: New small bilateral pleural effusions and compressive atelectasis in both lung bases. Increased pulmonary interstitial prominence and ground-glass opacity, suspicious for mild edema. No evidence of pulmonary consolidation or central airway obstruction. Upper abdomen: No acute findings. Large left renal cyst again noted. Musculoskeletal: No suspicious bone lesions identified. Review of the MIP images confirms the above findings. IMPRESSION: No evidence of  pulmonary embolism. New mild pulmonary interstitial prominence and ground-glass opacity, with small bilateral pleural effusions, suspicious for pulmonary edema/congestive heart failure. Aortic Atherosclerosis (ICD10-I70.0). Mild cardiomegaly and coronary artery calcification. Electronically Signed   By: JEarle GellM.D.   On: 10/20/2017 12:14       Assessment & Plan:   Problem List Items Addressed This Visit    Acute respiratory failure with hypoxia ([place]    Admitted and diagnosed with acute respiratory failure with hypoxia - in the setting of COPD exacerbation and CHF exacerbation.  Was diuresed.  Continues on lasix and off triam/hctz.  Is some better.  Still with some congestion and increased cough.  Prednisone taper as directed. Continue inhalers.  Follow.        CAD (coronary artery disease)    Followed by cardiology.  Had ECHO in hospital.  Diuresed.  Breathing better.  Continue lasix and potassium.  Check metabolic panel.  Keep f/u with cardiology next week.        CHF (congestive heart failure) (H[facility])    Diuresed as outlined.  Continue lasix.  Check metabolic panel.  Keep f/u with cardiology next week.        CKD (chronic kidney disease), stage III ([facility]) - Primary    Avoid antiinflammatories.  Off triam/hctz.  On lasix.  Follow metabolic panel.        Relevant Orders   Basic metabolic panel (Completed)   COP[place]    With increased cough and congestion.  Continue inhalers.  Prednisone taper as directed.        Relevant Medications   predniSONE (DELTASONE) 10 MG tablet   Essential hypertension    Blood pressure improved.  Continue current medications.  Follow.            SEinar Pheasant MD

## 2017-10-26 NOTE — Patient Instructions (Signed)
Robitussin twice a day as needed for cough and congestion.

## 2017-10-29 ENCOUNTER — Encounter: Payer: Self-pay | Admitting: Internal Medicine

## 2017-10-29 DIAGNOSIS — Z8551 Personal history of malignant neoplasm of bladder: Secondary | ICD-10-CM | POA: Diagnosis not present

## 2017-10-29 DIAGNOSIS — J449 Chronic obstructive pulmonary disease, unspecified: Secondary | ICD-10-CM | POA: Diagnosis not present

## 2017-10-29 DIAGNOSIS — R0609 Other forms of dyspnea: Secondary | ICD-10-CM | POA: Diagnosis not present

## 2017-10-29 NOTE — Assessment & Plan Note (Signed)
Avoid antiinflammatories.  Off triam/hctz.  On lasix.  Follow metabolic panel.

## 2017-10-29 NOTE — Assessment & Plan Note (Signed)
With increased cough and congestion.  Continue inhalers.  Prednisone taper as directed.

## 2017-10-29 NOTE — Assessment & Plan Note (Signed)
Admitted and diagnosed with acute respiratory failure with hypoxia - in the setting of COPD exacerbation and CHF exacerbation.  Was diuresed.  Continues on lasix and off triam/hctz.  Is some better.  Still with some congestion and increased cough.  Prednisone taper as directed. Continue inhalers.  Follow.

## 2017-10-29 NOTE — Assessment & Plan Note (Signed)
Diuresed as outlined.  Continue lasix.  Check metabolic panel.  Keep f/u with cardiology next week.

## 2017-10-29 NOTE — Assessment & Plan Note (Signed)
Blood pressure improved.  Continue current medications.  Follow.

## 2017-10-29 NOTE — Assessment & Plan Note (Signed)
Followed by cardiology.  Had ECHO in hospital.  Diuresed.  Breathing better.  Continue lasix and potassium.  Check metabolic panel.  Keep f/u with cardiology next week.

## 2017-10-30 ENCOUNTER — Other Ambulatory Visit: Payer: Self-pay | Admitting: Internal Medicine

## 2017-10-30 DIAGNOSIS — E876 Hypokalemia: Secondary | ICD-10-CM

## 2017-10-30 DIAGNOSIS — N183 Chronic kidney disease, stage 3 unspecified: Secondary | ICD-10-CM

## 2017-10-30 NOTE — Progress Notes (Signed)
Order placed for f/u met b 

## 2017-10-31 ENCOUNTER — Other Ambulatory Visit (INDEPENDENT_AMBULATORY_CARE_PROVIDER_SITE_OTHER): Payer: Medicare Other

## 2017-10-31 DIAGNOSIS — E876 Hypokalemia: Secondary | ICD-10-CM

## 2017-10-31 DIAGNOSIS — R0602 Shortness of breath: Secondary | ICD-10-CM | POA: Diagnosis not present

## 2017-10-31 DIAGNOSIS — N183 Chronic kidney disease, stage 3 unspecified: Secondary | ICD-10-CM

## 2017-10-31 DIAGNOSIS — I10 Essential (primary) hypertension: Secondary | ICD-10-CM | POA: Diagnosis not present

## 2017-10-31 DIAGNOSIS — R002 Palpitations: Secondary | ICD-10-CM | POA: Diagnosis not present

## 2017-10-31 DIAGNOSIS — Z955 Presence of coronary angioplasty implant and graft: Secondary | ICD-10-CM | POA: Diagnosis not present

## 2017-10-31 DIAGNOSIS — I5031 Acute diastolic (congestive) heart failure: Secondary | ICD-10-CM | POA: Diagnosis not present

## 2017-10-31 DIAGNOSIS — J449 Chronic obstructive pulmonary disease, unspecified: Secondary | ICD-10-CM | POA: Diagnosis not present

## 2017-10-31 DIAGNOSIS — E7800 Pure hypercholesterolemia, unspecified: Secondary | ICD-10-CM | POA: Diagnosis not present

## 2017-10-31 LAB — BASIC METABOLIC PANEL
BUN: 26 mg/dL — AB (ref 6–23)
CHLORIDE: 100 meq/L (ref 96–112)
CO2: 32 meq/L (ref 19–32)
CREATININE: 1.1 mg/dL (ref 0.40–1.20)
Calcium: 9.5 mg/dL (ref 8.4–10.5)
GFR: 50.68 mL/min — ABNORMAL LOW (ref >60.00)
Glucose, Bld: 205 mg/dL — ABNORMAL HIGH (ref 70–99)
Potassium: 4 mEq/L (ref 3.5–5.1)
Sodium: 141 mEq/L (ref 135–145)

## 2017-11-01 ENCOUNTER — Other Ambulatory Visit: Payer: Self-pay | Admitting: Internal Medicine

## 2017-11-01 DIAGNOSIS — N183 Chronic kidney disease, stage 3 unspecified: Secondary | ICD-10-CM

## 2017-11-01 NOTE — Progress Notes (Signed)
Order placed for f/u met b 

## 2017-11-05 ENCOUNTER — Inpatient Hospital Stay: Payer: Medicare Other | Attending: Oncology

## 2017-11-05 ENCOUNTER — Ambulatory Visit: Payer: Medicare Other | Admitting: Radiation Oncology

## 2017-11-05 DIAGNOSIS — C679 Malignant neoplasm of bladder, unspecified: Secondary | ICD-10-CM | POA: Diagnosis not present

## 2017-11-05 DIAGNOSIS — Z95828 Presence of other vascular implants and grafts: Secondary | ICD-10-CM

## 2017-11-05 DIAGNOSIS — Z452 Encounter for adjustment and management of vascular access device: Secondary | ICD-10-CM | POA: Diagnosis not present

## 2017-11-05 MED ORDER — SODIUM CHLORIDE 0.9% FLUSH
10.0000 mL | INTRAVENOUS | Status: DC | PRN
  Administered 2017-11-05: 10 mL via INTRAVENOUS
  Filled 2017-11-05: qty 10

## 2017-11-05 MED ORDER — HEPARIN SOD (PORK) LOCK FLUSH 100 UNIT/ML IV SOLN
500.0000 [IU] | Freq: Once | INTRAVENOUS | Status: AC
  Administered 2017-11-05: 500 [IU] via INTRAVENOUS
  Filled 2017-11-05: qty 5

## 2017-11-08 ENCOUNTER — Other Ambulatory Visit (INDEPENDENT_AMBULATORY_CARE_PROVIDER_SITE_OTHER): Payer: Medicare Other

## 2017-11-08 DIAGNOSIS — N183 Chronic kidney disease, stage 3 unspecified: Secondary | ICD-10-CM

## 2017-11-08 LAB — BASIC METABOLIC PANEL
BUN: 22 mg/dL (ref 6–23)
CO2: 32 mEq/L (ref 19–32)
CREATININE: 1.13 mg/dL (ref 0.40–1.20)
Calcium: 9.9 mg/dL (ref 8.4–10.5)
Chloride: 102 mEq/L (ref 96–112)
GFR: 49.13 mL/min — AB (ref >60.00)
Glucose, Bld: 167 mg/dL — ABNORMAL HIGH (ref 70–99)
Potassium: 3.9 mEq/L (ref 3.5–5.1)
Sodium: 140 mEq/L (ref 135–145)

## 2017-11-11 ENCOUNTER — Other Ambulatory Visit: Payer: Self-pay | Admitting: Internal Medicine

## 2017-11-16 ENCOUNTER — Encounter: Payer: Self-pay | Admitting: Family

## 2017-11-16 ENCOUNTER — Ambulatory Visit: Payer: Medicare Other | Attending: Family | Admitting: Family

## 2017-11-16 VITALS — BP 180/60 | HR 71 | Resp 18 | Ht 65.0 in | Wt 134.2 lb

## 2017-11-16 DIAGNOSIS — Z951 Presence of aortocoronary bypass graft: Secondary | ICD-10-CM | POA: Insufficient documentation

## 2017-11-16 DIAGNOSIS — I130 Hypertensive heart and chronic kidney disease with heart failure and stage 1 through stage 4 chronic kidney disease, or unspecified chronic kidney disease: Secondary | ICD-10-CM | POA: Insufficient documentation

## 2017-11-16 DIAGNOSIS — Z79899 Other long term (current) drug therapy: Secondary | ICD-10-CM | POA: Diagnosis not present

## 2017-11-16 DIAGNOSIS — Z87891 Personal history of nicotine dependence: Secondary | ICD-10-CM | POA: Diagnosis not present

## 2017-11-16 DIAGNOSIS — I5032 Chronic diastolic (congestive) heart failure: Secondary | ICD-10-CM

## 2017-11-16 DIAGNOSIS — K219 Gastro-esophageal reflux disease without esophagitis: Secondary | ICD-10-CM | POA: Diagnosis not present

## 2017-11-16 DIAGNOSIS — J449 Chronic obstructive pulmonary disease, unspecified: Secondary | ICD-10-CM | POA: Insufficient documentation

## 2017-11-16 DIAGNOSIS — I2510 Atherosclerotic heart disease of native coronary artery without angina pectoris: Secondary | ICD-10-CM | POA: Diagnosis not present

## 2017-11-16 DIAGNOSIS — E7800 Pure hypercholesterolemia, unspecified: Secondary | ICD-10-CM | POA: Insufficient documentation

## 2017-11-16 DIAGNOSIS — I10 Essential (primary) hypertension: Secondary | ICD-10-CM

## 2017-11-16 DIAGNOSIS — J410 Simple chronic bronchitis: Secondary | ICD-10-CM

## 2017-11-16 DIAGNOSIS — Z8551 Personal history of malignant neoplasm of bladder: Secondary | ICD-10-CM | POA: Insufficient documentation

## 2017-11-16 DIAGNOSIS — Z85828 Personal history of other malignant neoplasm of skin: Secondary | ICD-10-CM | POA: Diagnosis not present

## 2017-11-16 NOTE — Progress Notes (Signed)
[name], female    DOB: 20-Oct-1934, 83 y.o.   MRN: [id number]  HPI  Tina [name] is a 83 y/o female with a history of asthma, bladder cancer, CAD, hyperlipidemia, HTN, CKD, COPD, GERD, anemia, previous tobacco use and chronic heart failure.   Echo report from 10/20/17 reviewed and showed an EF of 60-65% along with mild MR.   Admitted 10/20/17 due to acute hypoxic respiratory failure due to COPD/HF exacerbation. Cardiology consult obtained. Initially needed IV lasix and then transitioned to oral diuretics. Discharged after 2 days.   She presents today for her initial visit with a chief complaint of moderate fatigue upon minimal exertion. She says that this has been present for several months and attributes some of it to the multiple radiation treatments that she has received. She has associated shortness of breath, pedal edema, palpitations and easy bruising along with this. She denies any difficulty sleeping, abdominal distention, chest pain, cough, dizziness or weight gain.   Past Medical History:  Diagnosis Date  . Anemia   . Asthma   . Bladder cancer ([place])   . BRCA positive   . CAD (coronary artery disease)   . CHF (congestive heart failure) ([place])   . COPD (chronic obstructive pulmonary disease) ([place])   . Emphysema lung ([place])   . GERD (gastroesophageal reflux disease)   . History of colon polyps   . Hypercholesterolemia   . Hyperglycemia   . Hypertension   . Lung nodules   . Osteoporosis   . Personal history of tobacco use, presenting hazards to health 11/25/2014  . Polycythemia vera(238.4)   . Renal cyst   . Skin cancer    Past Surgical History:  Procedure Laterality Date  . ANGIOPLASTY     coronary (x1)  . COLONOSCOPY WITH PROPOFOL N/A 03/02/2015   Procedure: COLONOSCOPY WITH PROPOFOL;  Surgeon: Lollie Sails, MD;  Location: [facility] ENDOSCOPY;  Service: Endoscopy;  Laterality: N/A;  . COLONOSCOPY WITH PROPOFOL N/A 02/13/2017   Procedure: COLONOSCOPY WITH  PROPOFOL;  Surgeon: [name], MD;  Location: [facility] ENDOSCOPY;  Service: Endoscopy;  Laterality: N/A;  . CORONARY ANGIOPLASTY    . CORONARY ARTERY BYPASS GRAFT  09/17/2017   pt denies  . CYSTOSCOPY W/ RETROGRADES Bilateral 02/13/2017   Procedure: CYSTOSCOPY WITH RETROGRADE PYELOGRAM;  Surgeon: Hollice Espy, MD;  Location: [facility] ORS;  Service: Urology;  Laterality: Bilateral;  . CYSTOSCOPY W/ RETROGRADES Bilateral 09/19/2017   Procedure: CYSTOSCOPY WITH RETROGRADE PYELOGRAM;  Surgeon: Hollice Espy, MD;  Location: [facility] ORS;  Service: Urology;  Laterality: Bilateral;  . CYSTOSCOPY WITH BIOPSY  03/26/2017   Procedure: CYSTOSCOPY WITH BIOPSY;  Surgeon: Hollice Espy, MD;  Location: [facility] ORS;  Service: Urology;;  . Consuela Mimes WITH STENT PLACEMENT Bilateral 02/13/2017   Procedure: CYSTOSCOPY WITH STENT PLACEMENT;  Surgeon: Hollice Espy, MD;  Location: [facility] ORS;  Service: Urology;  Laterality: Bilateral;  . CYSTOSCOPY WITH STENT PLACEMENT Bilateral 03/26/2017   Procedure: CYSTOSCOPY WITH STENT EXCHANGE;  Surgeon: Hollice Espy, MD;  Location: [facility] ORS;  Service: Urology;  Laterality: Bilateral;  . ESOPHAGOGASTRODUODENOSCOPY (EGD) WITH PROPOFOL N/A 02/13/2017   Procedure: ESOPHAGOGASTRODUODENOSCOPY (EGD) WITH PROPOFOL;  Surgeon: [name], MD;  Location: [facility] ENDOSCOPY;  Service: Endoscopy;  Laterality: N/A;  . PORTA CATH INSERTION N/A 02/28/2017   Procedure: PORTA CATH INSERTION;  Surgeon: Algernon Huxley, MD;  Location: [place] CV LAB;  Service: Cardiovascular;  Laterality: N/A;  . SALPINGOOPHORECTOMY    . TONSILECTOMY/ADENOIDECTOMY WITH MYRINGOTOMY    .  TONSILLECTOMY    . TRANSURETHRAL RESECTION OF BLADDER TUMOR N/A 02/13/2017   Procedure: TRANSURETHRAL RESECTION OF BLADDER TUMOR (TURBT);  Surgeon: Hollice Espy, MD;  Location: [facility] ORS;  Service: Urology;  Laterality: N/A;  . TUBAL LIGATION    . UPPER GI ENDOSCOPY  02/13/2017  . URETEROSCOPY Left 09/19/2017   Procedure:  URETEROSCOPY;  Surgeon: Hollice Espy, MD;  Location: [facility] ORS;  Service: Urology;  Laterality: Left;  Marland Kitchen VISCERAL ARTERY INTERVENTION N/A 02/12/2017   Procedure: VISCERAL ARTERY INTERVENTION;  Surgeon: Algernon Huxley, MD;  Location: [place] CV LAB;  Service: Cardiovascular;  Laterality: N/A;   Family History  Problem Relation Age of Onset  . Cirrhosis Father        died age 57  . Alcohol abuse Father   . Asthma Mother   . Congestive Heart Failure Mother   . Breast cancer Mother        2 (1/2 sisters)  . Osteoarthritis Mother   . Colon cancer Mother   . Lupus Sister   . Alcohol abuse Sister   . Ovarian cancer Sister   . Osteoporosis Sister   . Skin cancer Sister   . Breast cancer Cousin   . Breast cancer Maternal Aunt    Social History   Tobacco Use  . Smoking status: Former Smoker    Packs/day: 1.00    Years: 45.00    Pack years: 45.00    Types: Cigarettes    Last attempt to quit: 11/11/2003    Years since quitting: 14.0  . Smokeless tobacco: Never Used  Substance Use Topics  . Alcohol use: No    Alcohol/week: 0.0 oz    Comment: occasional   Allergies  Allergen Reactions  . Evista [Raloxifene] Other (See Comments)    Night sweats   . Fluticasone-Salmeterol Other (See Comments)    Cough, "chokes me"    Prior to Admission medications   Medication Sig Start Date End Date Taking? Authorizing Provider  albuterol (PROVENTIL HFA;VENTOLIN HFA) 108 (90 Base) MCG/ACT inhaler Inhale 2 puffs into the lungs every 6 (six) hours as needed for wheezing or shortness of breath. 10/22/17  Yes Mody, Ulice Bold, MD  amLODipine (NORVASC) 5 MG tablet Take 1 tablet (5 mg total) by mouth 2 (two) times daily. 10/22/17  Yes Bettey Costa, MD  cholecalciferol (VITAMIN D) 1000 units tablet Take 1,000 Units by mouth daily.   Yes [provider]  ELIQUIS 5 MG TABS tablet TAKE 1 TABLET BY MOUTH TWO  TIMES DAILY 07/31/17  Yes Sindy Guadeloupe, MD  fluticasone (FLONASE) 50 MCG/ACT nasal spray  Place 2 sprays into both nostrils daily as needed for allergies. 09/24/17  Yes Einar Pheasant, MD  furosemide (LASIX) 20 MG tablet Take 1 tablet (20 mg total) by mouth daily. 10/22/17 10/22/18 Yes Mody, Sital, MD  ipratropium-albuterol (DUONEB) 0.5-2.5 (3) MG/3ML SOLN Take 3 mLs by nebulization every 6 (six) hours as needed (for shortness of breath). 11/14/14  Yes Joanne Gavel, MD  losartan (COZAAR) 100 MG tablet TAKE 137m BY MOUTH  DAILY 10/22/17  Yes Mody, Sital, MD  lovastatin (MEVACOR) 40 MG tablet TAKE 1 TABLET BY MOUTH  DAILY (evening) 10/22/17  Yes Mody, Sital, MD  metoprolol tartrate (LOPRESSOR) 25 MG tablet Take 1 tablet (25 mg total) by mouth 2 (two) times daily. 10/05/17  Yes SEinar Pheasant MD  potassium chloride (K-DUR,KLOR-CON) 10 MEQ tablet Take 1 tablet (10 mEq total) by mouth daily. 10/22/17  Yes MBettey Costa MD  Review of Systems  Constitutional: Positive for fatigue (with moderate exertion). Negative for appetite change.  HENT: Negative for congestion, rhinorrhea and sore throat.   Eyes: Negative.   Respiratory: Positive for shortness of breath (with minimal exertion). Negative for cough and chest tightness.   Cardiovascular: Positive for palpitations (at times) and leg swelling. Negative for chest pain.  Gastrointestinal: Negative for abdominal distention and abdominal pain.  Endocrine: Negative.   Genitourinary: Negative.   Musculoskeletal: Negative for back pain and neck pain.  Skin: Negative.   Allergic/Immunologic: Negative.   Neurological: Negative for dizziness and light-headedness.  Hematological: Negative for adenopathy. Bruises/bleeds easily.  Psychiatric/Behavioral: Negative for dysphoric mood and sleep disturbance (sleeping on 1 pillow). The patient is not nervous/anxious.    Vitals:   11/16/17 0840  BP: (!) 180/60  Pulse: 71  Resp: 18  SpO2: 99%  Weight: 134 lb 4 oz (60.9 kg)  Height: '5\' 5"'  (1.651 m)   Wt Readings from Last 3 Encounters:  11/16/17 134  lb 4 oz (60.9 kg)  10/26/17 141 lb 9.6 oz (64.2 kg)  10/22/17 143 lb 3.2 oz (65 kg)   Lab Results  Component Value Date   CREATININE 1.13 11/08/2017   CREATININE 1.10 10/31/2017   CREATININE 1.09 10/26/2017    Physical Exam  Constitutional: She is oriented to person, place, and time. She appears well-developed and well-nourished.  HENT:  Head: Normocephalic and atraumatic.  Neck: Normal range of motion. Neck supple. No JVD present.  Cardiovascular: Normal rate and regular rhythm.  Pulmonary/Chest: Effort normal. No respiratory distress. She has no wheezes. She has no rales.  Abdominal: Soft. She exhibits no distension.  Musculoskeletal: She exhibits edema (1+ pitting edema bilaterally). She exhibits no tenderness.  Neurological: She is alert and oriented to person, place, and time.  Skin: Skin is warm and dry. Bruising (both arms) noted.  Psychiatric: She has a normal mood and affect. Her behavior is normal. Thought content normal.  Nursing note and vitals reviewed.  Assessment & Plan:  1: Chronic heart failure with preserved ejection fraction- - NYHA class III - mildly fluid overloaded with pedal edema - weighing daily although doesn't always write it down. Advised her to write it down daily and call for an overnight weight gain of >2 pounds or a weekly weight gain of >5 pounds - not adding salt and has been reading food labels occasionally. Reviewed the importance of closely following a 2038m sodium diet and written dietary information was given to her about this - saw cardiology ([place]) 10/31/17 - wearing support pantyhose and says that after she elevates her legs, the edema goes down. No swelling when she first wakes up in the mornings - is quite active and line dances twice weekly - BNP 10/20/17 was 517.0 - PharmD reconciled medications with the patient  2: HTN- - BP elevated today - discussed possibly increasing her metoprolol if BP remains elevated; could consider  stopping amlodipine to see if edema improves but would then need to start something else - was on triamterene (per patient) and then it was stopped due to decrease in renal function - checking her BP at home; instructed her to bring readings in to her next appointment - saw PCP (Nicki Reaper 10/26/17 - BMP on 11/08/17 reviewed and showed sodium 140, potassium 3.9 and GFR 49.13  3: COPD- - saw pulmonology (Raul Del 10/29/17 - PFT's done in 2017  Patient did not bring her medications nor a list. Each medication was verbally reviewed with the  patient and she was encouraged to bring the bottles to every visit to confirm accuracy of list.  Return in 1 month or sooner for any questions/problems before then.

## 2017-11-16 NOTE — Patient Instructions (Signed)
Continue weighing daily and call for an overnight weight gain of > 2 pounds or a weekly weight gain of >5 pounds. 

## 2017-12-10 ENCOUNTER — Inpatient Hospital Stay: Payer: Medicare Other | Attending: Oncology

## 2017-12-10 ENCOUNTER — Ambulatory Visit
Admission: RE | Admit: 2017-12-10 | Discharge: 2017-12-10 | Disposition: A | Discharge status: Home / Self Care | Payer: Medicare Other | Source: Ambulatory Visit | Attending: Oncology | Admitting: Oncology

## 2017-12-10 DIAGNOSIS — I2510 Atherosclerotic heart disease of native coronary artery without angina pectoris: Secondary | ICD-10-CM | POA: Diagnosis not present

## 2017-12-10 DIAGNOSIS — C679 Malignant neoplasm of bladder, unspecified: Secondary | ICD-10-CM | POA: Diagnosis not present

## 2017-12-10 DIAGNOSIS — I700 Atherosclerosis of aorta: Secondary | ICD-10-CM | POA: Diagnosis not present

## 2017-12-10 DIAGNOSIS — K760 Fatty (change of) liver, not elsewhere classified: Secondary | ICD-10-CM | POA: Insufficient documentation

## 2017-12-10 DIAGNOSIS — J439 Emphysema, unspecified: Secondary | ICD-10-CM | POA: Diagnosis not present

## 2017-12-10 DIAGNOSIS — C671 Malignant neoplasm of dome of bladder: Secondary | ICD-10-CM | POA: Diagnosis not present

## 2017-12-10 LAB — CBC WITH DIFFERENTIAL/PLATELET
Basophils Absolute: 0.1 10*3/uL (ref 0–0.1)
Basophils Relative: 1 %
EOS ABS: 0.4 10*3/uL (ref 0–0.7)
EOS PCT: 3 %
HCT: 44.5 % (ref 35.0–47.0)
Hemoglobin: 15.4 g/dL (ref 12.0–16.0)
LYMPHS ABS: 1.8 10*3/uL (ref 1.0–3.6)
Lymphocytes Relative: 16 %
MCH: 32 pg (ref 26.0–34.0)
MCHC: 34.6 g/dL (ref 32.0–36.0)
MCV: 92.5 fL (ref 80.0–100.0)
MONO ABS: 1.2 10*3/uL — AB (ref 0.2–0.9)
Monocytes Relative: 10 %
Neutro Abs: 7.9 10*3/uL — ABNORMAL HIGH (ref 1.4–6.5)
Neutrophils Relative %: 70 %
PLATELETS: 166 10*3/uL (ref 150–440)
RBC: 4.81 MIL/uL (ref 3.80–5.20)
RDW: 14.5 % (ref 11.5–14.5)
WBC: 11.2 10*3/uL — ABNORMAL HIGH (ref 3.6–11.0)

## 2017-12-10 LAB — COMPREHENSIVE METABOLIC PANEL
ALT: 12 U/L (ref 0–44)
AST: 20 U/L (ref 15–41)
Albumin: 4.1 g/dL (ref 3.5–5.0)
Alkaline Phosphatase: 65 U/L (ref 38–126)
Anion gap: 9 (ref 5–15)
BUN: 21 mg/dL (ref 8–23)
CHLORIDE: 105 mmol/L (ref 98–111)
CO2: 26 mmol/L (ref 22–32)
CREATININE: 1.06 mg/dL — AB (ref 0.44–1.00)
Calcium: 9.5 mg/dL (ref 8.9–10.3)
GFR calc Af Amer: 56 mL/min — ABNORMAL LOW (ref >60)
GFR, EST NON AFRICAN AMERICAN: 48 mL/min — AB (ref >60)
Glucose, Bld: 105 mg/dL — ABNORMAL HIGH (ref 70–99)
Potassium: 3.8 mmol/L (ref 3.5–5.1)
Sodium: 140 mmol/L (ref 135–145)
Total Bilirubin: 1 mg/dL (ref 0.3–1.2)
Total Protein: 7.2 g/dL (ref 6.5–8.1)

## 2017-12-10 MED ORDER — HEPARIN SOD (PORK) LOCK FLUSH 100 UNIT/ML IV SOLN: 500.0000 [IU] | Freq: Once | INTRAVENOUS | Status: AC

## 2017-12-10 MED ORDER — IOHEXOL 300 MG/ML  SOLN
100.0000 mL | Freq: Once | INTRAMUSCULAR | Status: AC | PRN
  Administered 2017-12-10: 100 mL via INTRAVENOUS

## 2017-12-10 MED ORDER — SODIUM CHLORIDE 0.9% FLUSH
10.0000 mL | INTRAVENOUS | Status: AC | PRN
  Filled 2017-12-10: qty 10

## 2017-12-12 DIAGNOSIS — R600 Localized edema: Secondary | ICD-10-CM | POA: Diagnosis not present

## 2017-12-12 DIAGNOSIS — N281 Cyst of kidney, acquired: Secondary | ICD-10-CM | POA: Diagnosis not present

## 2017-12-12 DIAGNOSIS — I129 Hypertensive chronic kidney disease with stage 1 through stage 4 chronic kidney disease, or unspecified chronic kidney disease: Secondary | ICD-10-CM | POA: Diagnosis not present

## 2017-12-12 DIAGNOSIS — N183 Chronic kidney disease, stage 3 (moderate): Secondary | ICD-10-CM | POA: Diagnosis not present

## 2017-12-13 ENCOUNTER — Telehealth: Payer: Self-pay | Admitting: *Deleted

## 2017-12-13 ENCOUNTER — Encounter: Payer: Self-pay | Admitting: Oncology

## 2017-12-13 ENCOUNTER — Inpatient Hospital Stay: Payer: Medicare Other | Attending: Oncology | Admitting: Oncology

## 2017-12-13 VITALS — BP 200/66 | HR 63 | Temp 97.6°F | Resp 18 | Ht 65.0 in | Wt 136.0 lb

## 2017-12-13 DIAGNOSIS — Z7901 Long term (current) use of anticoagulants: Secondary | ICD-10-CM | POA: Insufficient documentation

## 2017-12-13 DIAGNOSIS — I10 Essential (primary) hypertension: Secondary | ICD-10-CM | POA: Diagnosis not present

## 2017-12-13 DIAGNOSIS — D751 Secondary polycythemia: Secondary | ICD-10-CM | POA: Diagnosis not present

## 2017-12-13 DIAGNOSIS — I2782 Chronic pulmonary embolism: Secondary | ICD-10-CM

## 2017-12-13 DIAGNOSIS — Z86711 Personal history of pulmonary embolism: Secondary | ICD-10-CM | POA: Diagnosis not present

## 2017-12-13 DIAGNOSIS — Z87891 Personal history of nicotine dependence: Secondary | ICD-10-CM

## 2017-12-13 DIAGNOSIS — Z923 Personal history of irradiation: Secondary | ICD-10-CM | POA: Insufficient documentation

## 2017-12-13 DIAGNOSIS — Z800 Family history of malignant neoplasm of digestive organs: Secondary | ICD-10-CM | POA: Diagnosis not present

## 2017-12-13 DIAGNOSIS — Z7982 Long term (current) use of aspirin: Secondary | ICD-10-CM | POA: Diagnosis not present

## 2017-12-13 DIAGNOSIS — R5382 Chronic fatigue, unspecified: Secondary | ICD-10-CM | POA: Diagnosis not present

## 2017-12-13 DIAGNOSIS — C679 Malignant neoplasm of bladder, unspecified: Secondary | ICD-10-CM | POA: Diagnosis not present

## 2017-12-13 DIAGNOSIS — Z79899 Other long term (current) drug therapy: Secondary | ICD-10-CM | POA: Insufficient documentation

## 2017-12-13 DIAGNOSIS — Z9221 Personal history of antineoplastic chemotherapy: Secondary | ICD-10-CM | POA: Insufficient documentation

## 2017-12-13 NOTE — Telephone Encounter (Signed)
Pt. Here but her b/p elevated and it stayed elevated. Last b/p 189/69 which was alittle better but pt. Advised to go to ER but she does not want to go and she did sit here extra 25 min but no change in b/p. She has to take her family member to eye appt because of his vision. I told pt that she is going against the advice her the doctor and told her that if she has HA, slurring of speech, numbness and tingling now her ext., Chest pain she is to immediately go to ER.  I did remind her that she has an appt. Tomorrow with Dr. Nicki Reaper. She states she will go there. The patient does state that in the past she was told by md in ER that if her top number  Is 200 and bottom number is 100 she is ok. I advised her that those numbers are not what our office would go by.

## 2017-12-13 NOTE — Progress Notes (Signed)
No new changes noted today  Repeat blood pressure 194/66 patient has refused to go to the ED today , Per dr Janese Banks will repeat in 15-20 min

## 2017-12-13 NOTE — Progress Notes (Signed)
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 She has had jak 2 testing as well as EPO levels in the past which was unremarkable. She gets a phlebotomy when her hematocrit is more than 50. She was last seen by me in June 2018. She also has a history of lung nodules and was getting low-dose lung cancer screening scans on 07/04/2014 when her insurance stopped paying.   She  presented to the hospital in October 2018 with symptoms of nausea and diarrhea weight loss and fatigue. She was noted to have irregular wall thickening in the ascending colon suspicious for colon cancer as well as 4.2 x 3.2 cm posterior bladder wall mass that was incidentally noted. She was also seen by vascular surgery and underwent abdominal angiogram with stent placement at the [facility]. She is on aspirin and Plavix for the same. Patient underwent colonoscopy for concerns of ischemic colitis which showed irregular thickening of the descending colon consistent with ischemic colitis. EGD showed a small hiatal hernia. Losing gastric ulcers were noted which was treated with argon photocoagulation.  With regards to the posterior bladder wall mass she was seen by Dr. Erlene Quan and underwent TURBT with bilateral ureteral stent placement on 02/13/2017. Pathology showed small cell carcinoma with muscularis propria invasion. 4 cycles of  carbo/etoposide completed on 12/261/8. She completed RT following that as she was not deemed to be a surgical candidate. She remains in remission  Incidentally found to have PE on scans done in jan 2019   Interval history-she was admitted to the hospital last month for heart failure and her diuretics were adjusted.  She also follows up with nephrology now.  Other than chronic fatigue and leg edema patient denies any new complaints today.  She has been on Eliquis for about 6 months now.  ECOG PS- 1 Pain scale- 0   Review of systems- Review of Systems  Constitutional: Positive for malaise/fatigue.  Cardiovascular: Positive for leg swelling.     Allergies  Allergen Reactions  . Evista [Raloxifene] Other (See Comments)    Night sweats   . Fluticasone-Salmeterol Other (See Comments)    Cough, "chokes me"      Past Medical History:  Diagnosis Date  . Anemia   . Asthma   . Bladder cancer ([place])   . BRCA positive   . CAD (coronary artery disease)   . CHF (congestive heart failure) ([place])   . COPD (chronic obstructive pulmonary disease) ([place])   . Emphysema lung ([place])   . GERD (gastroesophageal reflux disease)   . History of colon polyps   . Hypercholesterolemia   . Hyperglycemia   . Hypertension   . Lung nodules   . Osteoporosis   . Personal history of tobacco use, presenting hazards to health 11/25/2014  . Polycythemia vera(238.4)   . Renal cyst   . Skin cancer      Past Surgical History:  Procedure Laterality Date  . ANGIOPLASTY  coronary (x1)  . COLONOSCOPY WITH PROPOFOL N/A 03/02/2015   Procedure: COLONOSCOPY WITH PROPOFOL;  Surgeon: Lollie Sails, MD;  Location[facility] END[facility]COPY;  Service: Endoscopy;  Laterality: N/A;  . COLONOSCOPY WITH PROPOFOL N/A 02/13/2017   Procedure: COLONOSCOPY WITH PROPOFOL;  Surgeon: [name], MD;  Location: [facility] ENDOSCOPY;  Service: Endoscopy;  Laterality: N/A;  . CORONARY ANGIOPLASTY    . CORONARY ARTERY BYPASS GRAFT   09/17/2017   pt denies  . CYSTOSCOPY W/ RETROGRADES Bilateral 02/13/2017   Procedure: CYSTOSCOPY WITH RETROGRADE PYELOGRAM;  Surgeon: Hollice Espy, MD;  Location: [facility] ORS;  Service: Urology;  Laterality: Bilateral;  . CYSTOSCOPY W/ RETROGRADES Bilateral 09/19/2017   Procedure: CYSTOSCOPY WITH RETROGRADE PYELOGRAM;  Surgeon: Hollice Espy, MD;  Location: [facility] ORS;  Service: Urology;  Laterality: Bilateral;  . CYSTOSCOPY WITH BIOPSY  03/26/2017   Procedure: CYSTOSCOPY WITH BIOPSY;  Surgeon: Hollice Espy, MD;  Location: [facility] ORS;  Service: Urology;;  . Consuela Mimes WITH STENT PLACEMENT Bilateral 02/13/2017   Procedure: CYSTOSCOPY WITH STENT PLACEMENT;  Surgeon: Hollice Espy, MD;  Location: [facility] ORS;  Service: Urology;  Laterality: Bilateral;  . CYSTOSCOPY WITH STENT PLACEMENT Bilateral 03/26/2017   Procedure: CYSTOSCOPY WITH STENT EXCHANGE;  Surgeon: Hollice Espy, MD;  Location: [facility] ORS;  Service: Urology;  Laterality: Bilateral;  . ESOPHAGOGASTRODUODENOSCOPY (EGD) WITH PROPOFOL N/A 02/13/2017   Procedure: ESOPHAGOGASTRODUODENOSCOPY (EGD) WITH PROPOFOL;  Surgeon: [name], MD;  Location: [facility] ENDOSCOPY;  Service: Endoscopy;  Laterality: N/A;  . PORTA CATH INSERTION N/A 02/28/2017   Procedure: PORTA CATH INSERTION;  Surgeon: Algernon Huxley, MD;  Location: [place] CV LAB;  Service: Cardiovascular;  Laterality: N/A;  . SALPINGOOPHORECTOMY    . TONSILECTOMY/ADENOIDECTOMY WITH MYRINGOTOMY    . TONSILLECTOMY    . TRANSURETHRAL RESECTION OF BLADDER TUMOR N/A 02/13/2017   Procedure: TRANSURETHRAL RESECTION OF BLADDER TUMOR (TURBT);  Surgeon: Hollice Espy, MD;  Location: [facility] ORS;  Service: Urology;  Laterality: N/A;  . TUBAL LIGATION    . UPPER GI ENDOSCOPY  02/13/2017  . URETEROSCOPY Left 09/19/2017   Procedure: URETEROSCOPY;  Surgeon: Hollice Espy, MD;  Location: [facility] ORS;  Service: Urology;  Laterality: Left;  Marland Kitchen VISCERAL ARTERY INTERVENTION N/A 02/12/2017   Procedure: VISCERAL  ARTERY INTERVENTION;  Surgeon: Algernon Huxley, MD;  Location: [place] CV LAB;  Service: Cardiovascular;  Laterality: N/A;    Social History   Socioeconomic History  . Marital status: Widowed    Spouse name: Not on file  . Number of children: 2  . Years of education: Not on file  . Highest education level: Not on file  Occupational History  . Not on file  Social Needs  . Financial resource strain: Not hard at all  . Food insecurity:    Worry: Never true    Inability: Never true  . Transportation needs:    Medical: No    Non-medical: No  Tobacco Use  . Smoking status: Former Smoker    Packs/day: 1.00    Years: 45.00    Pack years: 45.00    Types: Cigarettes    Last attempt to quit: 11/11/2003    Years since quitting: 14.0  . Smokeless tobacco: Never Used  Substance and Sexual Activity  . Alcohol use: No    Alcohol/week: 0.0 oz    Comment: occasional  . Drug use: No  . Sexual activity: Never  Lifestyle  . Physical activity:    Days per week: 2 days    Minutes per session: 90 min  .  Stress: Not at all  Relationships  . Social connections:    Talks on phone: Not on file    Gets together: Not on file    Attends religious service: Not on file    Active member of club or organization: Not on file    Attends meetings of clubs or organizations: Not on file    Relationship status: Not on file  . Intimate partner violence:    Fear of current or ex partner: No    Emotionally abused: No    Physically abused: No    Forced sexual activity: No  Other Topics Concern  . Not on file  Social History Narrative  . Not on file    Family History  Problem Relation Age of Onset  . Cirrhosis Father        died age 83  . Alcohol abuse Father   . Asthma Mother   . Congestive Heart Failure Mother   . Breast cancer Mother        2 (1/2 sisters)  . Osteoarthritis Mother   . Colon cancer Mother   . Lupus Sister   . Alcohol abuse Sister   . Ovarian cancer Sister   .  Osteoporosis Sister   . Skin cancer Sister   . Breast cancer Cousin   . Breast cancer Maternal Aunt      Current Outpatient Medications:  .  amLODipine (NORVASC) 5 MG tablet, Take 1 tablet (5 mg total) by mouth 2 (two) times daily., Disp: , Rfl:  .  cholecalciferol (VITAMIN D) 1000 units tablet, Take 1,000 Units by mouth daily., Disp: , Rfl:  .  ELIQUIS 5 MG TABS tablet, TAKE 1 TABLET BY MOUTH TWO  TIMES DAILY, Disp: 180 tablet, Rfl: 0 .  losartan (COZAAR) 100 MG tablet, TAKE 139m BY MOUTH  DAILY, Disp: 90 tablet, Rfl: 1 .  lovastatin (MEVACOR) 40 MG tablet, TAKE 1 TABLET BY MOUTH  DAILY (evening), Disp: 90 tablet, Rfl: 0 .  metoprolol tartrate (LOPRESSOR) 25 MG tablet, Take 1 tablet (25 mg total) by mouth 2 (two) times daily., Disp: 180 tablet, Rfl: 1 .  potassium chloride (K-DUR,KLOR-CON) 10 MEQ tablet, Take 1 tablet (10 mEq total) by mouth daily., Disp: 90 tablet, Rfl: 1 .  albuterol (PROVENTIL HFA;VENTOLIN HFA) 108 (90 Base) MCG/ACT inhaler, Inhale 2 puffs into the lungs every 6 (six) hours as needed for wheezing or shortness of breath. (Patient not taking: Reported on 12/13/2017), Disp: 1 Inhaler, Rfl: 2 .  fluticasone (FLONASE) 50 MCG/ACT nasal spray, Place 2 sprays into both nostrils daily as needed for allergies. (Patient not taking: Reported on 12/13/2017), Disp: 48 g, Rfl: 1 .  furosemide (LASIX) 20 MG tablet, Take 1 tablet (20 mg total) by mouth daily. (Patient not taking: Reported on 12/13/2017), Disp: 30 tablet, Rfl: 11 .  ipratropium-albuterol (DUONEB) 0.5-2.5 (3) MG/3ML SOLN, Take 3 mLs by nebulization every 6 (six) hours as needed (for shortness of breath). (Patient not taking: Reported on 12/13/2017), Disp: 36 mL, Rfl: 0 No current facility-administered medications for this visit.   Facility-Administered Medications Ordered in Other Visits:  .  heparin lock flush 100 unit/mL, 500 Units, Intravenous, Once, RRanda EvensC, MD .  sodium chloride flush (NS) 0.9 % injection 10 mL, 10 mL,  Intravenous, PRN, RSindy Guadeloupe MD  Physical exam:  Vitals:   12/13/17 1424  BP: (!) 200/66  Pulse: 63  Resp: 18  Temp: 97.6 F (36.4 C)  TempSrc: Tympanic  SpO2: 96%  Weight: 136 lb (61.7 kg)  Height: 5' 5" (1.651 m)   Physical Exam  Constitutional: She is oriented to person, place, and time.  Thin elderly female in no acute distress  HENT:  Head: Normocephalic and atraumatic.  Eyes: Pupils are equal, round, and reactive to light. EOM are normal.  Neck: Normal range of motion.  Cardiovascular: Normal rate, regular rhythm and normal heart sounds.  Pulmonary/Chest: Effort normal and breath sounds normal.  Abdominal: Soft. Bowel sounds are normal.  Musculoskeletal: She exhibits edema (Bilateral +2).  Neurological: She is alert and oriented to person, place, and time.  Skin: Skin is warm and dry.     CMP Latest Ref Rng & Units 12/10/2017  Glucose 70 - 99 mg/dL 105(H)  BUN 8 - 23 mg/dL 21  Creatinine 0.44 - 1.00 mg/dL 1.06(H)  Sodium 135 - 145 mmol/L 140  Potassium 3.5 - 5.1 mmol/L 3.8  Chloride 98 - 111 mmol/L 105  CO2 22 - 32 mmol/L 26  Calcium 8.9 - 10.3 mg/dL 9.5  Total Protein 6.5 - 8.1 g/dL 7.2  Total Bilirubin 0.3 - 1.2 mg/dL 1.0  Alkaline Phos 38 - 126 U/L 65  AST 15 - 41 U/L 20  ALT 0 - 44 U/L 12   CBC Latest Ref Rng & Units 12/10/2017  WBC 3.6 - 11.0 K/uL 11.2(H)  Hemoglobin 12.0 - 16.0 g/dL 15.4  Hematocrit 35.0 - 47.0 % 44.5  Platelets 150 - 440 K/uL 166    No images are attached to the encounter.  Ct Chest W Contrast  Result Date: 12/10/2017 CLINICAL DATA:  Bladder carcinoma.  Staging. EXAM: CT CHEST, ABDOMEN, AND PELVIS WITH CONTRAST TECHNIQUE: Multidetector CT imaging of the chest, abdomen and pelvis was performed following the standard protocol during bolus administration of intravenous contrast. CONTRAST:  165m OMNIPAQUE IOHEXOL 300 MG/ML  SOLN COMPARISON:  Chest CTA on 10/20/2017 and AP CT on 09/07/2017 FINDINGS: CT CHEST FINDINGS Cardiovascular:  No acute findings. Aortic and coronary artery atherosclerosis. Mediastinum/Lymph Nodes: No masses or pathologically enlarged lymph nodes identified. Lungs/Pleura: No pulmonary infiltrate or mass identified. No effusion present. Mild emphysema noted. Musculoskeletal:  No suspicious bone lesions identified. CT ABDOMEN AND PELVIS FINDINGS Hepatobiliary: No masses identified. Mild hepatic steatosis. Gallbladder is unremarkable. Pancreas:  No mass or inflammatory changes. Spleen:  Within normal limits in size and appearance. Adrenals/Urinary tract: No masses or hydronephrosis. Bilateral renal cysts again seen, largest in the left kidney measuring 6.4 cm. No focal bladder mass visualized. Mild diffuse bladder wall thickening and mucosal enhancement is suspicious for cystitis. Stomach/Bowel: No evidence of obstruction, inflammatory process, or abnormal fluid collections. Vascular/Lymphatic: No pathologically enlarged lymph nodes identified. No abdominal aortic aneurysm. Aortic atherosclerosis. Reproductive:  No mass or other significant abnormality identified. Other:  None. Musculoskeletal:  No suspicious bone lesions identified. IMPRESSION: Mild diffuse bladder wall thickening and mucosal enhancement, suspicious for cystitis. No focal bladder mass visualized. No evidence of metastatic disease. Mild hepatic steatosis. Aortic Atherosclerosis (ICD10-I70.0) and Emphysema (ICD10-J43.9). Coronary artery calcification. Electronically Signed   By: JEarle GellM.D.   On: 12/10/2017 14:52   Ct Abdomen Pelvis W Contrast  Result Date: 12/10/2017 CLINICAL DATA:  Bladder carcinoma.  Staging. EXAM: CT CHEST, ABDOMEN, AND PELVIS WITH CONTRAST TECHNIQUE: Multidetector CT imaging of the chest, abdomen and pelvis was performed following the standard protocol during bolus administration of intravenous contrast. CONTRAST:  1079mOMNIPAQUE IOHEXOL 300 MG/ML  SOLN COMPARISON:  Chest CTA on 10/20/2017 and AP CT on 09/07/2017 FINDINGS: CT  CHEST FINDINGS Cardiovascular: No acute findings. Aortic and coronary artery atherosclerosis. Mediastinum/Lymph Nodes: No masses or pathologically enlarged lymph nodes identified. Lungs/Pleura: No pulmonary infiltrate or mass identified. No effusion present. Mild emphysema noted. Musculoskeletal:  No suspicious bone lesions identified. CT ABDOMEN AND PELVIS FINDINGS Hepatobiliary: No masses identified. Mild hepatic steatosis. Gallbladder is unremarkable. Pancreas:  No mass or inflammatory changes. Spleen:  Within normal limits in size and appearance. Adrenals/Urinary tract: No masses or hydronephrosis. Bilateral renal cysts again seen, largest in the left kidney measuring 6.4 cm. No focal bladder mass visualized. Mild diffuse bladder wall thickening and mucosal enhancement is suspicious for cystitis. Stomach/Bowel: No evidence of obstruction, inflammatory process, or abnormal fluid collections. Vascular/Lymphatic: No pathologically enlarged lymph nodes identified. No abdominal aortic aneurysm. Aortic atherosclerosis. Reproductive:  No mass or other significant abnormality identified. Other:  None. Musculoskeletal:  No suspicious bone lesions identified. IMPRESSION: Mild diffuse bladder wall thickening and mucosal enhancement, suspicious for cystitis. No focal bladder mass visualized. No evidence of metastatic disease. Mild hepatic steatosis. Aortic Atherosclerosis (ICD10-I70.0) and Emphysema (ICD10-J43.9). Coronary artery calcification. Electronically Signed   By: Earle Gell M.D.   On: 12/10/2017 14:52     Assessment and plan- Patient is a 83 y.o. 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 she is here for routine surveillance of small cell cancer of the bladder  1.  I have personally reviewed CT chest abdomen and pelvis images independently.  Currently there is no evidence of recurrent metastatic disease.  I will repeat another scan in 4 months time following which I will space  out her scans to every 6 months for the next year.  2.  Patient was found to have an incidental PE right after she completed her chemotherapy and radiation on CT scan.  She will complete 6 months of Eliquis in 2 weeks time following which she will stop taking it.   We will touch base with her cardiologist Dr.Paraschos to see if he wants to restart her Plavix at this time  3.  We will plan to get her port after she completes her Eliquis.  4.  Uncontrolled hypertension: Patient is always had elevated blood pressures in our office and on repeat check her systolic blood pressure was still in the 180s.  We have advised her to go to the ER for this.  Patient is asymptomatic understands the risks of not going to the ER including heart attacks and strokes.  We will inform her primary care doctor about her blood pressure readings.  I will see her back in 4 months with a CBC CMP and CT chest abdomen and pelvis     Visit Diagnosis 1. Small cell carcinoma of bladder (HCC)   2. Other chronic pulmonary embolism without acute cor pulmonale (HCC)      Dr. Randa Evens, MD, MPH [facility] at [facility] [phone] 12/14/2017 8:14 AM

## 2017-12-14 ENCOUNTER — Telehealth: Payer: Self-pay | Admitting: Internal Medicine

## 2017-12-14 ENCOUNTER — Ambulatory Visit (INDEPENDENT_AMBULATORY_CARE_PROVIDER_SITE_OTHER): Payer: Medicare Other | Admitting: Internal Medicine

## 2017-12-14 ENCOUNTER — Encounter: Payer: Self-pay | Admitting: Internal Medicine

## 2017-12-14 ENCOUNTER — Telehealth: Payer: Self-pay | Admitting: *Deleted

## 2017-12-14 VITALS — BP 158/78 | HR 66 | Temp 98.1°F | Resp 17 | Ht 65.0 in | Wt 134.5 lb

## 2017-12-14 DIAGNOSIS — E7800 Pure hypercholesterolemia, unspecified: Secondary | ICD-10-CM | POA: Diagnosis not present

## 2017-12-14 DIAGNOSIS — I2510 Atherosclerotic heart disease of native coronary artery without angina pectoris: Secondary | ICD-10-CM | POA: Diagnosis not present

## 2017-12-14 DIAGNOSIS — D45 Polycythemia vera: Secondary | ICD-10-CM

## 2017-12-14 DIAGNOSIS — J410 Simple chronic bronchitis: Secondary | ICD-10-CM

## 2017-12-14 DIAGNOSIS — Z1231 Encounter for screening mammogram for malignant neoplasm of breast: Secondary | ICD-10-CM | POA: Diagnosis not present

## 2017-12-14 DIAGNOSIS — I10 Essential (primary) hypertension: Secondary | ICD-10-CM | POA: Diagnosis not present

## 2017-12-14 DIAGNOSIS — R739 Hyperglycemia, unspecified: Secondary | ICD-10-CM

## 2017-12-14 DIAGNOSIS — N183 Chronic kidney disease, stage 3 unspecified: Secondary | ICD-10-CM

## 2017-12-14 NOTE — Telephone Encounter (Signed)
Seeing pt this pm.

## 2017-12-14 NOTE — Progress Notes (Signed)
[name], female   DOB: [birth year], 83 y.o.   MRN: [id number]   Subjective:    [name], female    DOB: 1935-03-07, 83 y.o.   MRN: [id number]  HPI  Patient here for a scheduled follow up.  She was evaluated by Dr Candiss Norse two days ago.  Lasix changed to torsemide in hopes of better blood pressure control.  Was evaluated in oncology - blood pressure elevated.  Comes in today for a scheduled follow up.  She reports she feels good.  Back line dancing.  No chest pain.  Energy is better.  Breathing better.  No acid reflux.  No abdominal pain.  Bowels moving.  States blood pressures averaging 158-160/78.     Past Medical History:  Diagnosis Date  . Anemia   . Asthma   . Bladder cancer ([place])   . BRCA positive   . CAD (coronary artery disease)   . CHF (congestive heart failure) ([place])   . COPD (chronic obstructive pulmonary disease) ([place])   . Emphysema lung ([place])   . GERD (gastroesophageal reflux disease)   . History of colon polyps   . Hypercholesterolemia   . Hyperglycemia   . Hypertension   . Lung nodules   . Osteoporosis   . Personal history of tobacco use, presenting hazards to health 11/25/2014  . Polycythemia vera(238.4)   . Renal cyst   . Skin cancer    Past Surgical History:  Procedure Laterality Date  . ANGIOPLASTY     coronary (x1)  . COLONOSCOPY WITH PROPOFOL N/A 03/02/2015   Procedure: COLONOSCOPY WITH PROPOFOL;  Surgeon: Lollie Sails, MD;  Location: [facility] ENDOSCOPY;  Service: Endoscopy;  Laterality: N/A;  . COLONOSCOPY WITH PROPOFOL N/A 02/13/2017   Procedure: COLONOSCOPY WITH PROPOFOL;  Surgeon: [name], MD;  Location: [facility] ENDOSCOPY;  Service: Endoscopy;  Laterality: N/A;  . CORONARY ANGIOPLASTY    . CORONARY ARTERY BYPASS GRAFT  09/17/2017   pt denies  . CYSTOSCOPY W/ RETROGRADES Bilateral 02/13/2017   Procedure: CYSTOSCOPY WITH RETROGRADE PYELOGRAM;  Surgeon: Hollice Espy, MD;  Location: [facility] ORS;  Service: Urology;   Laterality: Bilateral;  . CYSTOSCOPY W/ RETROGRADES Bilateral 09/19/2017   Procedure: CYSTOSCOPY WITH RETROGRADE PYELOGRAM;  Surgeon: Hollice Espy, MD;  Location: [facility] ORS;  Service: Urology;  Laterality: Bilateral;  . CYSTOSCOPY WITH BIOPSY  03/26/2017   Procedure: CYSTOSCOPY WITH BIOPSY;  Surgeon: Hollice Espy, MD;  Location: [facility] ORS;  Service: Urology;;  . Consuela Mimes WITH STENT PLACEMENT Bilateral 02/13/2017   Procedure: CYSTOSCOPY WITH STENT PLACEMENT;  Surgeon: Hollice Espy, MD;  Location: [facility] ORS;  Service: Urology;  Laterality: Bilateral;  . CYSTOSCOPY WITH STENT PLACEMENT Bilateral 03/26/2017   Procedure: CYSTOSCOPY WITH STENT EXCHANGE;  Surgeon: Hollice Espy, MD;  Location: [facility] ORS;  Service: Urology;  Laterality: Bilateral;  . ESOPHAGOGASTRODUODENOSCOPY (EGD) WITH PROPOFOL N/A 02/13/2017   Procedure: ESOPHAGOGASTRODUODENOSCOPY (EGD) WITH PROPOFOL;  Surgeon: [name], MD;  Location: [facility] ENDOSCOPY;  Service: Endoscopy;  Laterality: N/A;  . PORTA CATH INSERTION N/A 02/28/2017   Procedure: PORTA CATH INSERTION;  Surgeon: Algernon Huxley, MD;  Location: [place] CV LAB;  Service: Cardiovascular;  Laterality: N/A;  . SALPINGOOPHORECTOMY    . TONSILECTOMY/ADENOIDECTOMY WITH MYRINGOTOMY    . TONSILLECTOMY    . TRANSURETHRAL RESECTION OF BLADDER TUMOR N/A 02/13/2017   Procedure: TRANSURETHRAL RESECTION OF BLADDER TUMOR (TURBT);  Surgeon: Hollice Espy, MD;  Location: [facility] ORS;  Service: Urology;  Laterality: N/A;  . TUBAL LIGATION    .  UPPER GI ENDOSCOPY  02/13/2017  . URETEROSCOPY Left 09/19/2017   Procedure: URETEROSCOPY;  Surgeon: Hollice Espy, MD;  Location: [facility] ORS;  Service: Urology;  Laterality: Left;  Marland Kitchen VISCERAL ARTERY INTERVENTION N/A 02/12/2017   Procedure: VISCERAL ARTERY INTERVENTION;  Surgeon: Algernon Huxley, MD;  Location: [place] CV LAB;  Service: Cardiovascular;  Laterality: N/A;   Family History  Problem Relation Age of Onset  . Cirrhosis Father          died age 42  . Alcohol abuse Father   . Asthma Mother   . Congestive Heart Failure Mother   . Breast cancer Mother        2 (1/2 sisters)  . Osteoarthritis Mother   . Colon cancer Mother   . Lupus Sister   . Alcohol abuse Sister   . Ovarian cancer Sister   . Osteoporosis Sister   . Skin cancer Sister   . Breast cancer Cousin   . Breast cancer Maternal Aunt    Social History   Socioeconomic History  . Marital status: Widowed    Spouse name: Not on file  . Number of children: 2  . Years of education: Not on file  . Highest education level: Not on file  Occupational History  . Not on file  Social Needs  . Financial resource strain: Not hard at all  . Food insecurity:    Worry: Never true    Inability: Never true  . Transportation needs:    Medical: No    Non-medical: No  Tobacco Use  . Smoking status: Former Smoker    Packs/day: 1.00    Years: 45.00    Pack years: 45.00    Types: Cigarettes    Last attempt to quit: 11/11/2003    Years since quitting: 14.1  . Smokeless tobacco: Never Used  Substance and Sexual Activity  . Alcohol use: No    Alcohol/week: 0.0 oz    Comment: occasional  . Drug use: No  . Sexual activity: Never  Lifestyle  . Physical activity:    Days per week: 2 days    Minutes per session: 90 min  . Stress: Not at all  Relationships  . Social connections:    Talks on phone: Not on file    Gets together: Not on file    Attends religious service: Not on file    Active member of club or organization: Not on file    Attends meetings of clubs or organizations: Not on file    Relationship status: Not on file  Other Topics Concern  . Not on file  Social History Narrative  . Not on file    Outpatient Encounter Medications as of 12/14/2017  Medication Sig  . albuterol (PROVENTIL HFA;VENTOLIN HFA) 108 (90 Base) MCG/ACT inhaler Inhale 2 puffs into the lungs every 6 (six) hours as needed for wheezing or shortness of breath.  Marland Kitchen amLODipine  (NORVASC) 5 MG tablet Take 1 tablet (5 mg total) by mouth 2 (two) times daily.  . cholecalciferol (VITAMIN D) 1000 units tablet Take 1,000 Units by mouth daily.  Marland Kitchen ELIQUIS 5 MG TABS tablet TAKE 1 TABLET BY MOUTH TWO  TIMES DAILY  . fluticasone (FLONASE) 50 MCG/ACT nasal spray Place 2 sprays into both nostrils daily as needed for allergies.  Marland Kitchen ipratropium-albuterol (DUONEB) 0.5-2.5 (3) MG/3ML SOLN Take 3 mLs by nebulization every 6 (six) hours as needed (for shortness of breath).  . losartan (COZAAR) 100 MG tablet TAKE 172m BY MOUTH  DAILY  . lovastatin (MEVACOR) 40 MG tablet TAKE 1 TABLET BY MOUTH  DAILY (evening)  . metoprolol tartrate (LOPRESSOR) 25 MG tablet Take 1 tablet (25 mg total) by mouth 2 (two) times daily.  . potassium chloride (K-DUR,KLOR-CON) 10 MEQ tablet Take 1 tablet (10 mEq total) by mouth daily.  Marland Kitchen torsemide (DEMADEX) 20 MG tablet Take 20 mg by mouth every morning.  . [DISCONTINUED] furosemide (LASIX) 20 MG tablet Take 1 tablet (20 mg total) by mouth daily.   Facility-Administered Encounter Medications as of 12/14/2017  Medication  . heparin lock flush 100 unit/mL  . sodium chloride flush (NS) 0.9 % injection 10 mL    Review of Systems  Constitutional: Negative for appetite change and unexpected weight change.  HENT: Negative for congestion and sinus pressure.   Respiratory: Negative for cough, chest tightness and shortness of breath.   Cardiovascular: Negative for chest pain, palpitations and leg swelling.  Gastrointestinal: Negative for abdominal pain, diarrhea and nausea.  Genitourinary: Negative for difficulty urinating and dysuria.  Musculoskeletal: Negative for joint swelling and myalgias.  Skin: Negative for color change and rash.  Neurological: Negative for dizziness, light-headedness and headaches.  Psychiatric/Behavioral: Negative for agitation and dysphoric mood.       Objective:     Blood pressure rechecked by me:  158-160/78-80.   Physical Exam    Constitutional: She appears well-developed and well-nourished. No distress.  HENT:  Nose: Nose normal.  Mouth/Throat: Oropharynx is clear and moist.  Neck: Neck supple. No thyromegaly present.  Cardiovascular: Normal rate and regular rhythm.  Pulmonary/Chest: Breath sounds normal. No respiratory distress. She has no wheezes.  Abdominal: Soft. Bowel sounds are normal. There is no tenderness.  Musculoskeletal: She exhibits no edema or tenderness.  Lymphadenopathy:    She has no cervical adenopathy.  Skin: No rash noted. No erythema.  Psychiatric: She has a normal mood and affect. Her behavior is normal.    BP (!) 158/78   Pulse 66   Temp 98.1 F (36.7 C) (Oral)   Resp 17   Ht '5\' 5"'  (1.651 m)   Wt 134 lb 8 oz (61 kg)   SpO2 98%   BMI 22.38 kg/m  Wt Readings from Last 3 Encounters:  12/14/17 134 lb 8 oz (61 kg)  12/13/17 136 lb (61.7 kg)  11/16/17 134 lb 4 oz (60.9 kg)     Lab Results  Component Value Date   WBC 11.2 (H) 12/10/2017   HGB 15.4 12/10/2017   HCT 44.5 12/10/2017   PLT 166 12/10/2017   GLUCOSE 105 (H) 12/10/2017   CHOL 158 02/16/2016   TRIG 86.0 02/16/2016   HDL 60.10 02/16/2016   LDLCALC 81 02/16/2016   ALT 12 12/10/2017   AST 20 12/10/2017   NA 140 12/10/2017   K 3.8 12/10/2017   CL 105 12/10/2017   CREATININE 1.06 (H) 12/10/2017   BUN 21 12/10/2017   CO2 26 12/10/2017   TSH 1.61 02/08/2017   HGBA1C 5.1 05/25/2017    Ct Chest W Contrast  Result Date: 12/10/2017 CLINICAL DATA:  Bladder carcinoma.  Staging. EXAM: CT CHEST, ABDOMEN, AND PELVIS WITH CONTRAST TECHNIQUE: Multidetector CT imaging of the chest, abdomen and pelvis was performed following the standard protocol during bolus administration of intravenous contrast. CONTRAST:  192m OMNIPAQUE IOHEXOL 300 MG/ML  SOLN COMPARISON:  Chest CTA on 10/20/2017 and AP CT on 09/07/2017 FINDINGS: CT CHEST FINDINGS Cardiovascular: No acute findings. Aortic and coronary artery atherosclerosis.  Mediastinum/Lymph Nodes: No masses or  pathologically enlarged lymph nodes identified. Lungs/Pleura: No pulmonary infiltrate or mass identified. No effusion present. Mild emphysema noted. Musculoskeletal:  No suspicious bone lesions identified. CT ABDOMEN AND PELVIS FINDINGS Hepatobiliary: No masses identified. Mild hepatic steatosis. Gallbladder is unremarkable. Pancreas:  No mass or inflammatory changes. Spleen:  Within normal limits in size and appearance. Adrenals/Urinary tract: No masses or hydronephrosis. Bilateral renal cysts again seen, largest in the left kidney measuring 6.4 cm. No focal bladder mass visualized. Mild diffuse bladder wall thickening and mucosal enhancement is suspicious for cystitis. Stomach/Bowel: No evidence of obstruction, inflammatory process, or abnormal fluid collections. Vascular/Lymphatic: No pathologically enlarged lymph nodes identified. No abdominal aortic aneurysm. Aortic atherosclerosis. Reproductive:  No mass or other significant abnormality identified. Other:  None. Musculoskeletal:  No suspicious bone lesions identified. IMPRESSION: Mild diffuse bladder wall thickening and mucosal enhancement, suspicious for cystitis. No focal bladder mass visualized. No evidence of metastatic disease. Mild hepatic steatosis. Aortic Atherosclerosis (ICD10-I70.0) and Emphysema (ICD10-J43.9). Coronary artery calcification. Electronically Signed   By: Earle Gell M.D.   On: 12/10/2017 14:52   Ct Abdomen Pelvis W Contrast  Result Date: 12/10/2017 CLINICAL DATA:  Bladder carcinoma.  Staging. EXAM: CT CHEST, ABDOMEN, AND PELVIS WITH CONTRAST TECHNIQUE: Multidetector CT imaging of the chest, abdomen and pelvis was performed following the standard protocol during bolus administration of intravenous contrast. CONTRAST:  126m OMNIPAQUE IOHEXOL 300 MG/ML  SOLN COMPARISON:  Chest CTA on 10/20/2017 and AP CT on 09/07/2017 FINDINGS: CT CHEST FINDINGS Cardiovascular: No acute findings. Aortic and  coronary artery atherosclerosis. Mediastinum/Lymph Nodes: No masses or pathologically enlarged lymph nodes identified. Lungs/Pleura: No pulmonary infiltrate or mass identified. No effusion present. Mild emphysema noted. Musculoskeletal:  No suspicious bone lesions identified. CT ABDOMEN AND PELVIS FINDINGS Hepatobiliary: No masses identified. Mild hepatic steatosis. Gallbladder is unremarkable. Pancreas:  No mass or inflammatory changes. Spleen:  Within normal limits in size and appearance. Adrenals/Urinary tract: No masses or hydronephrosis. Bilateral renal cysts again seen, largest in the left kidney measuring 6.4 cm. No focal bladder mass visualized. Mild diffuse bladder wall thickening and mucosal enhancement is suspicious for cystitis. Stomach/Bowel: No evidence of obstruction, inflammatory process, or abnormal fluid collections. Vascular/Lymphatic: No pathologically enlarged lymph nodes identified. No abdominal aortic aneurysm. Aortic atherosclerosis. Reproductive:  No mass or other significant abnormality identified. Other:  None. Musculoskeletal:  No suspicious bone lesions identified. IMPRESSION: Mild diffuse bladder wall thickening and mucosal enhancement, suspicious for cystitis. No focal bladder mass visualized. No evidence of metastatic disease. Mild hepatic steatosis. Aortic Atherosclerosis (ICD10-I70.0) and Emphysema (ICD10-J43.9). Coronary artery calcification. Electronically Signed   By: JEarle GellM.D.   On: 12/10/2017 14:52       Assessment & Plan:   Problem List Items Addressed This Visit    CAD (coronary artery disease)    Followed by cardiology.  Back to line dancing with no symptoms.  Overall feels better.  Continue risk factor modification.        Relevant Medications   torsemide (DEMADEX) 20 MG tablet   CKD (chronic kidney disease), stage III (HCC)    Avoid antiinflammatories.  On torsemide now.  Followed by nephrology.        COPD (chronic obstructive pulmonary disease)  (HCC)    Breathing better.  Continue current regimen.  Follow.        Essential hypertension    Blood pressure improved today when compared to yesterday.  Just saw nephrology.  Lasix changed to torsemide in hopes of better blood pressure control.  Discussed with nephrology.  Will have her f/u there for blood pressure control.        Relevant Medications   torsemide (DEMADEX) 20 MG tablet   Hypercholesterolemia    On lovastatin.  Follow lipid panel and liver function tests.        Relevant Medications   torsemide (DEMADEX) 20 MG tablet   Hyperglycemia    Low carb diet and exercise.  Follow met b and a1c.        Polycythemia vera ([place])    Followed by hematology.  Just evaluated yesterday.         Other Visit Diagnoses    Encounter for screening mammogram for breast cancer    -  Primary   Relevant Orders   MM Digital Screening       Einar Pheasant, MD

## 2017-12-14 NOTE — Telephone Encounter (Signed)
I received a note from cancer center regarding pts blood pressure being elevated.  They had wanted [name] to be aware.  Need to know if she has been checking her pressures and what running.  She also sees nephrology as well.  If persistent elevation, needs to be evaluated.  Cancer cneter suggested ER evaluation in their note.  Just need to know how she is doing and how blood pressure is doing.  If problems or persistent elevation  she needs to be evaluated.

## 2017-12-14 NOTE — Telephone Encounter (Signed)
Patient has an appointment this afternoon

## 2017-12-14 NOTE — Telephone Encounter (Signed)
I called paraschos office today and said that pt had a blood clot and is on eliquis and she will come off of it in 2 weeks and when she originally was dx with clot we had to call paraschos to ask him since she needs to go on eliquis should she still be on plavix and asa and at the time he said to take her off plavix and asa while she is taking eliquis. Our office needs to know when patient is told in 2 weeks to come off eliquis should pt start back on plavix and asa and Gilmore Laroche took message and called back to let me know that she should start the plavix and asa the day she stops taking the eliquis The patient was contacted and she knows to start back on the meds when she stops the eliquis. I will contact Dr. Lucky Cowboy office and set up port removal and let her know that date and she is agreeable to above. She will count her pills of eliquis and let me know how many she has left. We want her to complete the bottle she has.

## 2017-12-16 ENCOUNTER — Encounter: Payer: Self-pay | Admitting: Internal Medicine

## 2017-12-16 NOTE — Assessment & Plan Note (Signed)
Followed by hematology.  Just evaluated yesterday.

## 2017-12-16 NOTE — Assessment & Plan Note (Signed)
Blood pressure improved today when compared to yesterday.  Just saw nephrology.  Lasix changed to torsemide in hopes of better blood pressure control.  Discussed with nephrology.  Will have her f/u there for blood pressure control.

## 2017-12-16 NOTE — Assessment & Plan Note (Signed)
Avoid antiinflammatories.  On torsemide now.  Followed by nephrology.

## 2017-12-16 NOTE — Assessment & Plan Note (Signed)
On lovastatin.  Follow lipid panel and liver function tests.

## 2017-12-16 NOTE — Assessment & Plan Note (Signed)
Followed by cardiology.  Back to line dancing with no symptoms.  Overall feels better.  Continue risk factor modification.

## 2017-12-16 NOTE — Assessment & Plan Note (Signed)
Low carb diet and exercise.  Follow met b and a1c.   

## 2017-12-16 NOTE — Assessment & Plan Note (Signed)
Breathing better.  Continue current regimen.  Follow.

## 2017-12-17 ENCOUNTER — Telehealth: Payer: Self-pay | Admitting: *Deleted

## 2017-12-17 NOTE — Telephone Encounter (Signed)
Called pt to ask how many eliquis pt has left so I can make appt for port to be remov/e/d.  She has 8 1/2 days left after today ; that will take her to 8/14 in am as last dose. I sent message to [place] to get a date to have port removed and will let pt know and she is ok with above.

## 2017-12-18 ENCOUNTER — Telehealth: Payer: Self-pay | Admitting: *Deleted

## 2017-12-18 ENCOUNTER — Encounter (INDEPENDENT_AMBULATORY_CARE_PROVIDER_SITE_OTHER): Payer: Self-pay

## 2017-12-18 ENCOUNTER — Other Ambulatory Visit (INDEPENDENT_AMBULATORY_CARE_PROVIDER_SITE_OTHER): Payer: Self-pay | Admitting: Vascular Surgery

## 2017-12-18 NOTE — Telephone Encounter (Signed)
Called patient after I got the message from Mickel Baas about port removal. I told patient to stop eliquis 8/9 last dose. Then she will be off sat and sun. And Monday procedure. After procedure that night of the procedure she will start back on daily aspirin and Plavix. Patient agreeable to the above. I told her she will just have to waste the eliquis she has left over and she will.

## 2017-12-18 NOTE — Telephone Encounter (Signed)
-----   Message from Devona Konig, [place] sent at 12/18/2017  1:08 PM EDT ----- Judeen Hammans, I spoke with the patient and she is on the schedule with [facility] for Monday 12/24/17. Mickel Baas ----- Message ----- From: Luella Cook, RN Sent: 12/17/2017   1:14 PM To: Devona Konig, CMA  Mickel Baas,  The above pt. Is needing to get set up to take port out. Dr. Lucky Cowboy put it in.    She is on eliquis and when she finishes her last pill which will be 8 1/2 days from today she will no longer need it any longer; however she use to be on asa and plavix but when she got the DVT and started eliquis her cardiologist said to put her on hold.  I have contacted them and they would like her to start back onplavix and ASA when she completes the eliquis.  I need to arrange a time in between that she completes her eliquis and hold starting the ASA and Plavix until after port placed.  She should take last pill am on 8/14.  How many days do you need to be off eliquis to have port removed and can you set it up with the above date in mind for her last dose of eliquis and let me know. Thank you Sorry for long winded informaiton. sherry

## 2017-12-20 NOTE — Progress Notes (Signed)
[name], female    DOB: 02/14/1935, 83 y.o.   MRN: [id number]  HPI  Tina [name] is a 83 y/o female with a history of asthma, bladder cancer, CAD, hyperlipidemia, HTN, CKD, COPD, GERD, anemia, previous tobacco use and chronic heart failure.   Echo report from 10/20/17 reviewed and showed an EF of 60-65% along with mild MR.   Admitted 10/20/17 due to acute hypoxic respiratory failure due to COPD/HF exacerbation. Cardiology consult obtained. Initially needed IV lasix and then transitioned to oral diuretics. Discharged after 2 days.   She presents today for a follow-up visit with a chief complaint of minimal fatigue upon moderate exertion. She says that this has been present for many years. She has associated shortness of breath, pedal edema, palpitations, light-headedness and easy bruising along with this. She denies any difficulty sleeping, abdominal distention, chest pain, cough or weight gain. She says that her nephrologist changed some medications but she didn't bring them with her and can't recall what was changed.   Past Medical History:  Diagnosis Date  . Anemia   . Asthma   . Bladder cancer ([place])   . BRCA positive   . CAD (coronary artery disease)   . CHF (congestive heart failure) ([place])   . COPD (chronic obstructive pulmonary disease) ([place])   . Emphysema lung ([place])   . GERD (gastroesophageal reflux disease)   . History of colon polyps   . Hypercholesterolemia   . Hyperglycemia   . Hypertension   . Lung nodules   . Osteoporosis   . Personal history of tobacco use, presenting hazards to health 11/25/2014  . Polycythemia vera(238.4)   . Renal cyst   . Skin cancer    Past Surgical History:  Procedure Laterality Date  . ANGIOPLASTY     coronary (x1)  . COLONOSCOPY WITH PROPOFOL N/A 03/02/2015   Procedure: COLONOSCOPY WITH PROPOFOL;  Surgeon: Lollie Sails, MD;  Location: [facility] ENDOSCOPY;  Service: Endoscopy;  Laterality: N/A;  . COLONOSCOPY WITH PROPOFOL N/A  02/13/2017   Procedure: COLONOSCOPY WITH PROPOFOL;  Surgeon: [name], MD;  Location: [facility] ENDOSCOPY;  Service: Endoscopy;  Laterality: N/A;  . CORONARY ANGIOPLASTY    . CORONARY ARTERY BYPASS GRAFT  09/17/2017   pt denies  . CYSTOSCOPY W/ RETROGRADES Bilateral 02/13/2017   Procedure: CYSTOSCOPY WITH RETROGRADE PYELOGRAM;  Surgeon: Hollice Espy, MD;  Location: [facility] ORS;  Service: Urology;  Laterality: Bilateral;  . CYSTOSCOPY W/ RETROGRADES Bilateral 09/19/2017   Procedure: CYSTOSCOPY WITH RETROGRADE PYELOGRAM;  Surgeon: Hollice Espy, MD;  Location: [facility] ORS;  Service: Urology;  Laterality: Bilateral;  . CYSTOSCOPY WITH BIOPSY  03/26/2017   Procedure: CYSTOSCOPY WITH BIOPSY;  Surgeon: Hollice Espy, MD;  Location: [facility] ORS;  Service: Urology;;  . Consuela Mimes WITH STENT PLACEMENT Bilateral 02/13/2017   Procedure: CYSTOSCOPY WITH STENT PLACEMENT;  Surgeon: Hollice Espy, MD;  Location: [facility] ORS;  Service: Urology;  Laterality: Bilateral;  . CYSTOSCOPY WITH STENT PLACEMENT Bilateral 03/26/2017   Procedure: CYSTOSCOPY WITH STENT EXCHANGE;  Surgeon: Hollice Espy, MD;  Location: [facility] ORS;  Service: Urology;  Laterality: Bilateral;  . ESOPHAGOGASTRODUODENOSCOPY (EGD) WITH PROPOFOL N/A 02/13/2017   Procedure: ESOPHAGOGASTRODUODENOSCOPY (EGD) WITH PROPOFOL;  Surgeon: [name], MD;  Location: [facility] ENDOSCOPY;  Service: Endoscopy;  Laterality: N/A;  . PORTA CATH INSERTION N/A 02/28/2017   Procedure: PORTA CATH INSERTION;  Surgeon: Algernon Huxley, MD;  Location: [place] CV LAB;  Service: Cardiovascular;  Laterality: N/A;  . SALPINGOOPHORECTOMY    .  TONSILECTOMY/ADENOIDECTOMY WITH MYRINGOTOMY    . TONSILLECTOMY    . TRANSURETHRAL RESECTION OF BLADDER TUMOR N/A 02/13/2017   Procedure: TRANSURETHRAL RESECTION OF BLADDER TUMOR (TURBT);  Surgeon: Hollice Espy, MD;  Location: [facility] ORS;  Service: Urology;  Laterality: N/A;  . TUBAL LIGATION    . UPPER GI ENDOSCOPY  02/13/2017  .  URETEROSCOPY Left 09/19/2017   Procedure: URETEROSCOPY;  Surgeon: Hollice Espy, MD;  Location: [facility] ORS;  Service: Urology;  Laterality: Left;  Marland Kitchen VISCERAL ARTERY INTERVENTION N/A 02/12/2017   Procedure: VISCERAL ARTERY INTERVENTION;  Surgeon: Algernon Huxley, MD;  Location: [place] [facility];  Service: Cardiovascular;  Laterality: N/A;   Family History  Problem Relation Age of Onset  . Cirrhosis Father        died age 30  . Alcohol abuse Father   . Asthma Mother   . Congestive Heart Failure Mother   . Breast cancer Mother        2 (1/2 sisters)  . Osteoarthritis Mother   . Colon cancer Mother   . Lupus Sister   . Alcohol abuse Sister   . Ovarian cancer Sister   . Osteoporosis Sister   . Skin cancer Sister   . Breast cancer Cousin   . Breast cancer Maternal Aunt    Social History   Tobacco Use  . Smoking status: Former Smoker    Packs/day: 1.00    Years: 45.00    Pack years: 45.00    Types: Cigarettes    Last attempt to quit: 11/11/2003    Years since quitting: 14.1  . Smokeless tobacco: Never Used  Substance Use Topics  . Alcohol use: No    Alcohol/week: 0.0 standard drinks    Comment: occasional   Allergies  Allergen Reactions  . Evista [Raloxifene] Other (See Comments)    Night sweats   . Fluticasone-Salmeterol Other (See Comments)    Cough, "chokes me"    Prior to Admission medications   Medication Sig Start Date End Date Taking? Authorizing Provider  albuterol (PROVENTIL HFA;VENTOLIN HFA) 108 (90 Base) MCG/ACT inhaler Inhale 2 puffs into the lungs every 6 (six) hours as needed for wheezing or shortness of breath. 10/22/17  Yes Mody, Ulice Bold, MD  amLODipine (NORVASC) 5 MG tablet Take 1 tablet (5 mg total) by mouth 2 (two) times daily. 10/22/17  Yes Bettey Costa, MD  cholecalciferol (VITAMIN D) 1000 units tablet Take 1,000 Units by mouth daily.   Yes [provider]  ELIQUIS 5 MG TABS tablet TAKE 1 TABLET BY MOUTH TWO  TIMES DAILY 07/31/17  Yes Sindy Guadeloupe,  MD  fluticasone (FLONASE) 50 MCG/ACT nasal spray Place 2 sprays into both nostrils daily as needed for allergies. 09/24/17  Yes Einar Pheasant, MD  ipratropium-albuterol (DUONEB) 0.5-2.5 (3) MG/3ML SOLN Take 3 mLs by nebulization every 6 (six) hours as needed (for shortness of breath). 11/14/14  Yes Joanne Gavel, MD  losartan (COZAAR) 100 MG tablet TAKE 124m BY MOUTH  DAILY 10/22/17  Yes Mody, Sital, MD  lovastatin (MEVACOR) 40 MG tablet TAKE 1 TABLET BY MOUTH  DAILY (evening) 10/22/17  Yes Mody, Sital, MD  metoprolol tartrate (LOPRESSOR) 25 MG tablet Take 1 tablet (25 mg total) by mouth 2 (two) times daily. 10/05/17  Yes SEinar Pheasant MD  potassium chloride (K-DUR,KLOR-CON) 10 MEQ tablet Take 1 tablet (10 mEq total) by mouth daily. 10/22/17  Yes MBettey Costa MD  torsemide (DEMADEX) 20 MG tablet Take 20 mg by mouth every morning. 12/12/17  Yes [provider]    Review of Systems  Constitutional: Positive for fatigue (with moderate exertion). Negative for appetite change.  HENT: Negative for congestion, rhinorrhea and sore throat.   Eyes: Negative.   Respiratory: Positive for shortness of breath (with moderate exertion). Negative for cough and chest tightness.   Cardiovascular: Positive for palpitations (at times) and leg swelling ("better"). Negative for chest pain.  Gastrointestinal: Negative for abdominal distention and abdominal pain.  Endocrine: Negative.   Genitourinary: Negative.   Musculoskeletal: Negative for back pain and neck pain.  Skin: Negative.   Allergic/Immunologic: Negative.   Neurological: Positive for light-headedness ("wobbly"). Negative for dizziness.  Hematological: Negative for adenopathy. Bruises/bleeds easily.  Psychiatric/Behavioral: Negative for dysphoric mood and sleep disturbance (sleeping on 1 pillow). The patient is not nervous/anxious.    Vitals:   12/21/17 0853  BP: (!) 199/54  Pulse: (!) 56  Resp: 18  SpO2: 98%  Weight: 134 lb 6 oz (61 kg)   Height: '5\' 5"'  (1.651 m)   Wt Readings from Last 3 Encounters:  12/21/17 134 lb 6 oz (61 kg)  12/14/17 134 lb 8 oz (61 kg)  12/13/17 136 lb (61.7 kg)   Lab Results  Component Value Date   CREATININE 1.06 (H) 12/10/2017   CREATININE 1.13 11/08/2017   CREATININE 1.10 10/31/2017    Physical Exam  Constitutional: She is oriented to person, place, and time. She appears well-developed and well-nourished.  HENT:  Head: Normocephalic and atraumatic.  Neck: Normal range of motion. Neck supple. No JVD present.  Cardiovascular: Normal rate and regular rhythm.  Pulmonary/Chest: Effort normal. No respiratory distress. She has no wheezes. She has no rales.  Abdominal: Soft. She exhibits no distension.  Musculoskeletal: She exhibits edema (1+ pitting edema in bilateral lower legs). She exhibits no tenderness.  Neurological: She is alert and oriented to person, place, and time.  Skin: Skin is warm and dry. Bruising (both arms) noted.  Psychiatric: She has a normal mood and affect. Her behavior is normal. Thought content normal.  Nursing note and vitals reviewed.  Assessment & Plan:  1: Chronic heart failure with preserved ejection fraction- - NYHA class II - mildly fluid overloaded with pedal edema - weighing daily although doesn't always write it down. Reminded to call for an overnight weight gain of >2 pounds or a weekly weight gain of >5 pounds - weight unchanged from previous visit here - not adding salt and has been reading food labels occasionally. Reminded to closely follow a 2013m sodium diet - saw cardiology ([facility]) 10/31/17 - wearing support pantyhose and says that after she elevates her legs, the edema goes down. No swelling when she first wakes up in the mornings - she feels like her edema is better since medications were changed - encouraged her to get compression socks and wear them daily with removal at bedtime - is quite active and line dances twice weekly - BNP 10/20/17  was 517.0  2: HTN- - BP elevated today (199/54) and slightly improved upon recheck (180/60) - she says that a medication was changed by her nephrologist but doesn't remember what was changed nor did she bring her medications; will call provider and ask for his note - checking her BP at home and says that it's running 150-160's/ 80-90's at home which is higher than it had been running  - she says that she's been in contact with PCP about this - saw PCP (Nicki Reaper 12/14/17 - BMP on 12/10/17 reviewed and showed sodium  140, potassium 3.8 and GFR 48  3: COPD- - saw pulmonology Raul Del) 10/29/17 - PFT's done in 2017  Patient did not bring her medications nor a list. Each medication was verbally reviewed with the patient and she was encouraged to bring the bottles to every visit to confirm accuracy of list.  Return in 4 months or sooner for any questions/problems before then.

## 2017-12-21 ENCOUNTER — Encounter: Payer: Self-pay | Admitting: Family

## 2017-12-21 ENCOUNTER — Ambulatory Visit: Payer: Medicare Other | Attending: Family | Admitting: Family

## 2017-12-21 VITALS — BP 180/60 | HR 56 | Resp 18 | Ht 65.0 in | Wt 134.4 lb

## 2017-12-21 DIAGNOSIS — Z888 Allergy status to other drugs, medicaments and biological substances status: Secondary | ICD-10-CM | POA: Insufficient documentation

## 2017-12-21 DIAGNOSIS — Z8551 Personal history of malignant neoplasm of bladder: Secondary | ICD-10-CM | POA: Diagnosis not present

## 2017-12-21 DIAGNOSIS — I5032 Chronic diastolic (congestive) heart failure: Secondary | ICD-10-CM | POA: Insufficient documentation

## 2017-12-21 DIAGNOSIS — J439 Emphysema, unspecified: Secondary | ICD-10-CM | POA: Insufficient documentation

## 2017-12-21 DIAGNOSIS — Z79899 Other long term (current) drug therapy: Secondary | ICD-10-CM | POA: Diagnosis not present

## 2017-12-21 DIAGNOSIS — J410 Simple chronic bronchitis: Secondary | ICD-10-CM

## 2017-12-21 DIAGNOSIS — Z7901 Long term (current) use of anticoagulants: Secondary | ICD-10-CM | POA: Diagnosis not present

## 2017-12-21 DIAGNOSIS — I10 Essential (primary) hypertension: Secondary | ICD-10-CM

## 2017-12-21 DIAGNOSIS — K219 Gastro-esophageal reflux disease without esophagitis: Secondary | ICD-10-CM | POA: Diagnosis not present

## 2017-12-21 DIAGNOSIS — I130 Hypertensive heart and chronic kidney disease with heart failure and stage 1 through stage 4 chronic kidney disease, or unspecified chronic kidney disease: Secondary | ICD-10-CM | POA: Insufficient documentation

## 2017-12-21 DIAGNOSIS — E7800 Pure hypercholesterolemia, unspecified: Secondary | ICD-10-CM | POA: Insufficient documentation

## 2017-12-21 DIAGNOSIS — Z85828 Personal history of other malignant neoplasm of skin: Secondary | ICD-10-CM | POA: Diagnosis not present

## 2017-12-21 DIAGNOSIS — Z86010 Personal history of colonic polyps: Secondary | ICD-10-CM | POA: Diagnosis not present

## 2017-12-21 DIAGNOSIS — Z87891 Personal history of nicotine dependence: Secondary | ICD-10-CM | POA: Diagnosis not present

## 2017-12-21 DIAGNOSIS — I2510 Atherosclerotic heart disease of native coronary artery without angina pectoris: Secondary | ICD-10-CM | POA: Insufficient documentation

## 2017-12-21 NOTE — Progress Notes (Signed)
Please call nephrology (Dr Candiss Norse) office and notify them that pt needs a nurse check for blood pressure.  I had talked with Dr Holley Raring about this pt and he suggested this.  Dr Candiss Norse just made a change in her blood pressure medication.  Then notify pt of need for blood pressure check with Dr Candiss Norse and notify of time can go.  thanks

## 2017-12-21 NOTE — Progress Notes (Signed)
Called Dr. Keturah Barre office and got the message that due to network difficulties call could not be completed. Will try again Monday

## 2017-12-21 NOTE — Patient Instructions (Signed)
Continue weighing daily and call for an overnight weight gain of > 2 pounds or a weekly weight gain of >5 pounds. 

## 2017-12-23 MED ORDER — CEFAZOLIN SODIUM-DEXTROSE 2-4 GM/100ML-% IV SOLN
2.0000 g | Freq: Once | INTRAVENOUS | Status: AC
  Administered 2017-12-24: 2 g via INTRAVENOUS

## 2017-12-24 ENCOUNTER — Ambulatory Visit: Payer: Medicare Other | Admitting: Radiation Oncology

## 2017-12-24 ENCOUNTER — Encounter
Admission: RE | Disposition: A | Discharge status: Home / Self Care | Payer: Self-pay | Source: Ambulatory Visit | Attending: Vascular Surgery

## 2017-12-24 ENCOUNTER — Ambulatory Visit
Admission: RE | Admit: 2017-12-24 | Discharge: 2017-12-24 | Disposition: A | Discharge status: Home / Self Care | Payer: Medicare Other | Source: Ambulatory Visit | Attending: Vascular Surgery | Admitting: Vascular Surgery

## 2017-12-24 ENCOUNTER — Encounter: Payer: Self-pay | Admitting: Vascular Surgery

## 2017-12-24 ENCOUNTER — Inpatient Hospital Stay: Payer: Medicare Other

## 2017-12-24 DIAGNOSIS — Z951 Presence of aortocoronary bypass graft: Secondary | ICD-10-CM | POA: Diagnosis not present

## 2017-12-24 DIAGNOSIS — Z960 Presence of urogenital implants: Secondary | ICD-10-CM | POA: Diagnosis not present

## 2017-12-24 DIAGNOSIS — J449 Chronic obstructive pulmonary disease, unspecified: Secondary | ICD-10-CM | POA: Diagnosis not present

## 2017-12-24 DIAGNOSIS — I2510 Atherosclerotic heart disease of native coronary artery without angina pectoris: Secondary | ICD-10-CM | POA: Diagnosis not present

## 2017-12-24 DIAGNOSIS — Z955 Presence of coronary angioplasty implant and graft: Secondary | ICD-10-CM | POA: Insufficient documentation

## 2017-12-24 DIAGNOSIS — Z9889 Other specified postprocedural states: Secondary | ICD-10-CM | POA: Insufficient documentation

## 2017-12-24 DIAGNOSIS — K219 Gastro-esophageal reflux disease without esophagitis: Secondary | ICD-10-CM | POA: Diagnosis not present

## 2017-12-24 DIAGNOSIS — Z7901 Long term (current) use of anticoagulants: Secondary | ICD-10-CM | POA: Diagnosis not present

## 2017-12-24 DIAGNOSIS — Z87891 Personal history of nicotine dependence: Secondary | ICD-10-CM | POA: Insufficient documentation

## 2017-12-24 DIAGNOSIS — Z86010 Personal history of colonic polyps: Secondary | ICD-10-CM | POA: Insufficient documentation

## 2017-12-24 DIAGNOSIS — Z452 Encounter for adjustment and management of vascular access device: Secondary | ICD-10-CM | POA: Insufficient documentation

## 2017-12-24 DIAGNOSIS — E7800 Pure hypercholesterolemia, unspecified: Secondary | ICD-10-CM | POA: Diagnosis not present

## 2017-12-24 DIAGNOSIS — I110 Hypertensive heart disease with heart failure: Secondary | ICD-10-CM | POA: Insufficient documentation

## 2017-12-24 DIAGNOSIS — R739 Hyperglycemia, unspecified: Secondary | ICD-10-CM | POA: Insufficient documentation

## 2017-12-24 DIAGNOSIS — Z8249 Family history of ischemic heart disease and other diseases of the circulatory system: Secondary | ICD-10-CM | POA: Diagnosis not present

## 2017-12-24 DIAGNOSIS — Z9851 Tubal ligation status: Secondary | ICD-10-CM | POA: Insufficient documentation

## 2017-12-24 DIAGNOSIS — I5032 Chronic diastolic (congestive) heart failure: Secondary | ICD-10-CM | POA: Diagnosis not present

## 2017-12-24 DIAGNOSIS — C679 Malignant neoplasm of bladder, unspecified: Secondary | ICD-10-CM | POA: Insufficient documentation

## 2017-12-24 DIAGNOSIS — Z888 Allergy status to other drugs, medicaments and biological substances status: Secondary | ICD-10-CM | POA: Diagnosis not present

## 2017-12-24 HISTORY — PX: PORTA CATH REMOVAL: CATH118286

## 2017-12-24 SURGERY — PORTA CATH REMOVAL
Anesthesia: Moderate Sedation

## 2017-12-24 MED ORDER — CEFAZOLIN SODIUM-DEXTROSE 2-4 GM/100ML-% IV SOLN
INTRAVENOUS | Status: AC
  Filled 2017-12-24: qty 100

## 2017-12-24 MED ORDER — LIDOCAINE-EPINEPHRINE (PF) 1 %-1:200000 IJ SOLN
INTRAMUSCULAR | Status: AC
  Filled 2017-12-24: qty 30

## 2017-12-24 MED ORDER — HEPARIN (PORCINE) IN NACL 1000-0.9 UT/500ML-% IV SOLN
INTRAVENOUS | Status: AC
  Filled 2017-12-24: qty 500

## 2017-12-24 MED ORDER — HYDROMORPHONE HCL 1 MG/ML IJ SOLN: 1.0000 mg | Freq: Once | INTRAMUSCULAR | Status: DC | PRN

## 2017-12-24 MED ORDER — SODIUM CHLORIDE 0.9 % IV SOLN
INTRAVENOUS | Status: DC
  Administered 2017-12-24: 10:00:00 via INTRAVENOUS

## 2017-12-24 MED ORDER — FENTANYL CITRATE (PF) 100 MCG/2ML IJ SOLN
INTRAMUSCULAR | Status: AC
  Filled 2017-12-24: qty 2

## 2017-12-24 MED ORDER — MIDAZOLAM HCL 5 MG/5ML IJ SOLN
INTRAMUSCULAR | Status: AC
  Filled 2017-12-24: qty 5

## 2017-12-24 MED ORDER — ONDANSETRON HCL 4 MG/2ML IJ SOLN: 4.0000 mg | Freq: Four times a day (QID) | INTRAMUSCULAR | Status: DC | PRN

## 2017-12-24 MED ORDER — MIDAZOLAM HCL 2 MG/2ML IJ SOLN
INTRAMUSCULAR | Status: DC | PRN
  Administered 2017-12-24: 2 mg via INTRAVENOUS

## 2017-12-24 MED ORDER — FENTANYL CITRATE (PF) 100 MCG/2ML IJ SOLN
INTRAMUSCULAR | Status: DC | PRN
  Administered 2017-12-24: 50 ug via INTRAVENOUS

## 2017-12-24 SURGICAL SUPPLY — 6 items
DERMABOND ADVANCED (GAUZE/BANDAGES/DRESSINGS) ×2
DERMABOND ADVANCED .7 DNX12 (GAUZE/BANDAGES/DRESSINGS) ×1 IMPLANT
PACK ANGIOGRAPHY (CUSTOM PROCEDURE TRAY) ×3 IMPLANT
SPONGE XRAY 4X4 16PLY STRL (MISCELLANEOUS) ×3 IMPLANT
SUT VIC AB 3-0 SH 27 (SUTURE) ×2
SUT VIC AB 3-0 SH 27X BRD (SUTURE) ×1 IMPLANT

## 2017-12-24 NOTE — Op Note (Signed)
[facility]       Operative Note  Date: 12/24/2017  Preoperative diagnosis:  1. Bladder cancer no longer using port  Postoperative diagnosis:  Same as above  Procedures: #1. Removal of left jugular port a cath   Surgeon: Leotis Pain, MD  Anesthesia: Local with moderate conscious sedation for 15 minutes using 2 mg of Versed and 50 mcg of Fentanyl  Fluoroscopy time: none  Contrast used: 0  Estimated blood loss: Minimal  Indication for the procedure:  The patient is a 83 y.o. female who has has completed her therapy for bladder cancer and no longer needs their Port-A-Cath. The patient desires to have this removed. Risks and benefits including need for potential replacement with recurrent disease were discussed and patient is agreeable to proceed.  Description of procedure: The patient was brought to the vascular and interventional radiology suite. Moderate conscious sedation was administered during a face to face encounter with the patient throughout the procedure with my supervision of the RN administering medicines and monitoring the patient's vital signs, pulse oximetry, telemetry and mental status throughout from the start of the procedure until the patient was taken to the recovery room.  The left neck chest and shoulder were sterilely prepped and draped, and a sterile surgical field was created. The area was then anesthetized with 1% lidocaine copiously. The previous incision was reopened and electrocautery used to dissected down to the port and the catheter. These were dissected free and the catheter was gently removed from the vein in its entirety. The port was dissected out from the fibrous connective tissue and the Prolene sutures were removed. The port was then removed in its entirety including the catheter. The wound was then closed with a 3-0 Vicryl and a 4-0 Monocryl and Dermabond was placed as a dressing. The patient  was then taken to the recovery room in stable condition having tolerated the procedure well.  Complications: none  Condition: stable   Leotis Pain, MD 12/24/2017 11:23 AM   This note was created with Dragon Medical transcription system. Any errors in dictation are purely unintentional.

## 2017-12-24 NOTE — Discharge Instructions (Signed)
Moderate Conscious Sedation, Adult, Care After These instructions provide you with information about caring for yourself after your procedure. Your health care provider may also give you more specific instructions. Your treatment has been planned according to current medical practices, but problems sometimes occur. Call your health care provider if you have any problems or questions after your procedure. What can I expect after the procedure? After your procedure, it is common:  To feel sleepy for several hours.  To feel clumsy and have poor balance for several hours.  To have poor judgment for several hours.  To vomit if you eat too soon.  Follow these instructions at home: For at least 24 hours after the procedure:   Do not: ? Participate in activities where you could fall or become injured. ? Drive. ? Use heavy machinery. ? Drink alcohol. ? Take sleeping pills or medicines that cause drowsiness. ? Make important decisions or sign legal documents. ? Take care of children on your own.  Rest. Eating and drinking  Follow the diet recommended by your health care provider.  If you vomit: ? Drink water, juice, or soup when you can drink without vomiting. ? Make sure you have little or no nausea before eating solid foods. General instructions  Have a responsible adult stay with you until you are awake and alert.  Take over-the-counter and prescription medicines only as told by your health care provider.  If you smoke, do not smoke without supervision.  Keep all follow-up visits as told by your health care provider. This is important. Contact a health care provider if:  You keep feeling nauseous or you keep vomiting.  You feel light-headed.  You develop a rash.  You have a fever. Get help right away if:  You have trouble breathing. This information is not intended to replace advice given to you by your health care provider. Make sure you discuss any questions you have  with your health care provider. Document Released: 02/19/2013 Document Revised: 10/04/2015 Document Reviewed: 08/21/2015 Elsevier Interactive Patient Education  2018 [place], Adult An incision is a surgical cut that is made through your skin. Most incisions are closed after surgery. Your incision may be closed with stitches (sutures), staples, skin glue, or adhesive strips. You may need to return to your health care provider to have sutures or staples removed. This may occur several days to several weeks after your surgery. The incision needs to be cared for properly to prevent infection. How to care for your incision Incision care   Follow instructions from your health care provider about how to take care of your incision. Make sure you: ? Wash your hands with soap and water before you change the bandage (dressing). If soap and water are not available, use hand sanitizer. ? Change your dressing as told by your health care provider. ? Leave sutures, skin glue, or adhesive strips in place. These skin closures may need to stay in place for 2 weeks or longer. If adhesive strip edges start to loosen and curl up, you may trim the loose edges. Do not remove adhesive strips completely unless your health care provider tells you to do that.  Check your incision area every day for signs of infection. Check for: ? More redness, swelling, or pain. ? More fluid or blood. ? Warmth. ? Pus or a bad smell.  Ask your health care provider how to clean the incision. This may include: ? Using mild soap and water. ? Using a  clean towel to pat the incision dry after cleaning it. ? Applying a cream or ointment. Do this only as told by your health care provider. ? Covering the incision with a clean dressing.  Ask your health care provider when you can leave the incision uncovered.  Do not take baths, swim, or use a hot tub until your health care provider approves. Ask your health care  provider if you can take showers. You may only be allowed to take sponge baths for bathing. Medicines  If you were prescribed an antibiotic medicine, cream, or ointment, take or apply the antibiotic as told by your health care provider. Do not stop taking or applying the antibiotic even if your condition improves.  Take over-the-counter and prescription medicines only as told by your health care provider. General instructions  Limit movement around your incision to improve healing. ? Avoid straining, lifting, or exercise for the first month, or for as long as told by your health care provider. ? Follow instructions from your health care provider about returning to your normal activities. ? Ask your health care provider what activities are safe.  Protect your incision from the sun when you are outside for the first 6 months, or for as long as told by your health care provider. Apply sunscreen around the scar or cover it up.  Keep all follow-up visits as told by your health care provider. This is important. Contact a health care provider if:  Your have more redness, swelling, or pain around the incision.  You have more fluid or blood coming from the incision.  Your incision feels warm to the touch.  You have pus or a bad smell coming from the incision.  You have a fever or shaking chills.  You are nauseous or you vomit.  You are dizzy.  Your sutures or staples come undone. Get help right away if:  You have a red streak coming from your incision.  Your incision bleeds through the dressing and the bleeding does not stop with gentle pressure.  The edges of your incision open up and separate.  You have severe pain.  You have a rash.  You are confused.  You faint.  You have trouble breathing and a fast heartbeat. This information is not intended to replace advice given to you by your health care provider. Make sure you discuss any questions you have with your health care  provider. Document Released: 11/18/2004 Document Revised: 01/07/2016 Document Reviewed: 11/17/2015 Elsevier Interactive Patient Education  [employer].

## 2017-12-24 NOTE — H&P (Signed)
[facility] History & Physical Update  The patient was interviewed and re-examined.  The patient's previous History and Physical has been reviewed and is unchanged.  There is no change in the plan of care. We plan to proceed with the scheduled procedure.  Leotis Pain, MD  12/24/2017, 9:57 AM

## 2017-12-25 ENCOUNTER — Other Ambulatory Visit: Payer: Medicare Other | Admitting: Urology

## 2017-12-25 NOTE — Progress Notes (Signed)
Called Dr. Keturah Barre office. Patient has been put down for a bp check with Dr. Keturah Barre nurse on 12/31/17. I have called the patient and left a message to call back to make her aware of appt date

## 2018-01-03 ENCOUNTER — Ambulatory Visit: Payer: Medicare Other | Attending: Radiation Oncology | Admitting: Radiation Oncology

## 2018-01-24 DIAGNOSIS — I129 Hypertensive chronic kidney disease with stage 1 through stage 4 chronic kidney disease, or unspecified chronic kidney disease: Secondary | ICD-10-CM | POA: Diagnosis not present

## 2018-01-24 DIAGNOSIS — R600 Localized edema: Secondary | ICD-10-CM | POA: Diagnosis not present

## 2018-01-24 DIAGNOSIS — N281 Cyst of kidney, acquired: Secondary | ICD-10-CM | POA: Diagnosis not present

## 2018-01-24 DIAGNOSIS — N183 Chronic kidney disease, stage 3 (moderate): Secondary | ICD-10-CM | POA: Diagnosis not present

## 2018-01-29 ENCOUNTER — Other Ambulatory Visit: Payer: Self-pay | Admitting: Internal Medicine

## 2018-01-29 NOTE — Telephone Encounter (Signed)
Mevacor refill Last Refill: 10/22/17 # 90; 0 refills (per Dr. Bettey Costa) Last OV: 12/14/17 PCP: Dr. Nicki Reaper Pharmacy: [facility]  Last lipid panel 02/16/2016 Last Hepatic Panel 02/08/2017  Did not refill due to overdue lipid panel, and because last refill by Dr. Benjie Karvonen.  Please review/ advise.

## 2018-01-29 NOTE — Telephone Encounter (Signed)
Copied from [place] [id number]. Topic: Quick Communication - Rx Refill/Question >> Jan 29, 2018  8:42 AM Scherrie Gerlach wrote: Medication: lovastatin (MEVACOR) 40 MG tablet  Pt states optum advised pt to call her dr for this refill 90 day [place], [place] [phone] (Phone) [phone] (Fax)

## 2018-01-29 NOTE — Telephone Encounter (Signed)
rx refill was filled by a historic provider

## 2018-01-30 ENCOUNTER — Other Ambulatory Visit (INDEPENDENT_AMBULATORY_CARE_PROVIDER_SITE_OTHER): Payer: Medicare Other

## 2018-01-30 ENCOUNTER — Other Ambulatory Visit (INDEPENDENT_AMBULATORY_CARE_PROVIDER_SITE_OTHER): Payer: Self-pay | Admitting: Nephrology

## 2018-01-30 DIAGNOSIS — I10 Essential (primary) hypertension: Secondary | ICD-10-CM

## 2018-01-30 MED ORDER — LOVASTATIN 40 MG PO TABS: ORAL_TABLET | ORAL | 1 refills | Status: DC

## 2018-01-30 NOTE — Telephone Encounter (Signed)
OK to fill Lovastatin?

## 2018-01-30 NOTE — Telephone Encounter (Signed)
rx ok'd for lovastatin #90 with one refill.

## 2018-01-31 DIAGNOSIS — I10 Essential (primary) hypertension: Secondary | ICD-10-CM | POA: Diagnosis not present

## 2018-01-31 DIAGNOSIS — I2510 Atherosclerotic heart disease of native coronary artery without angina pectoris: Secondary | ICD-10-CM | POA: Diagnosis not present

## 2018-01-31 DIAGNOSIS — R002 Palpitations: Secondary | ICD-10-CM | POA: Diagnosis not present

## 2018-01-31 DIAGNOSIS — Z955 Presence of coronary angioplasty implant and graft: Secondary | ICD-10-CM | POA: Diagnosis not present

## 2018-01-31 DIAGNOSIS — R0602 Shortness of breath: Secondary | ICD-10-CM | POA: Diagnosis not present

## 2018-01-31 DIAGNOSIS — N183 Chronic kidney disease, stage 3 (moderate): Secondary | ICD-10-CM | POA: Diagnosis not present

## 2018-01-31 DIAGNOSIS — E7800 Pure hypercholesterolemia, unspecified: Secondary | ICD-10-CM | POA: Diagnosis not present

## 2018-02-02 ENCOUNTER — Other Ambulatory Visit: Payer: Self-pay | Admitting: Oncology

## 2018-02-02 ENCOUNTER — Other Ambulatory Visit: Payer: Self-pay | Admitting: Internal Medicine

## 2018-02-04 NOTE — Telephone Encounter (Signed)
Please see my note. I was not planning to continue it. I am not sure why she is asking for refill

## 2018-02-05 DIAGNOSIS — Z23 Encounter for immunization: Secondary | ICD-10-CM | POA: Diagnosis not present

## 2018-02-08 ENCOUNTER — Encounter: Payer: Self-pay | Admitting: Internal Medicine

## 2018-02-11 ENCOUNTER — Inpatient Hospital Stay: Payer: Medicare Other

## 2018-02-25 ENCOUNTER — Ambulatory Visit (INDEPENDENT_AMBULATORY_CARE_PROVIDER_SITE_OTHER): Payer: Medicare Other | Admitting: Internal Medicine

## 2018-02-25 VITALS — BP 146/62 | HR 76 | Temp 98.7°F | Ht 65.0 in | Wt 135.8 lb

## 2018-02-25 DIAGNOSIS — I2510 Atherosclerotic heart disease of native coronary artery without angina pectoris: Secondary | ICD-10-CM | POA: Diagnosis not present

## 2018-02-25 DIAGNOSIS — D45 Polycythemia vera: Secondary | ICD-10-CM | POA: Diagnosis not present

## 2018-02-25 DIAGNOSIS — L989 Disorder of the skin and subcutaneous tissue, unspecified: Secondary | ICD-10-CM | POA: Diagnosis not present

## 2018-02-25 DIAGNOSIS — I5032 Chronic diastolic (congestive) heart failure: Secondary | ICD-10-CM

## 2018-02-25 DIAGNOSIS — N281 Cyst of kidney, acquired: Secondary | ICD-10-CM | POA: Diagnosis not present

## 2018-02-25 DIAGNOSIS — I10 Essential (primary) hypertension: Secondary | ICD-10-CM | POA: Diagnosis not present

## 2018-02-25 DIAGNOSIS — E7800 Pure hypercholesterolemia, unspecified: Secondary | ICD-10-CM | POA: Diagnosis not present

## 2018-02-25 DIAGNOSIS — I129 Hypertensive chronic kidney disease with stage 1 through stage 4 chronic kidney disease, or unspecified chronic kidney disease: Secondary | ICD-10-CM | POA: Diagnosis not present

## 2018-02-25 DIAGNOSIS — J410 Simple chronic bronchitis: Secondary | ICD-10-CM | POA: Diagnosis not present

## 2018-02-25 DIAGNOSIS — R739 Hyperglycemia, unspecified: Secondary | ICD-10-CM

## 2018-02-25 DIAGNOSIS — N183 Chronic kidney disease, stage 3 unspecified: Secondary | ICD-10-CM

## 2018-02-25 DIAGNOSIS — R600 Localized edema: Secondary | ICD-10-CM | POA: Diagnosis not present

## 2018-02-25 NOTE — Progress Notes (Signed)
Pre visit review using our clinic review tool, if applicable. No additional management support is needed unless otherwise documented below in the visit note. 

## 2018-02-25 NOTE — Progress Notes (Signed)
[name], female   DOB: Nov 25, 1934, 83 y.o.   MRN: [id number]   Subjective:    [name], female    DOB: 12-20-1934, 83 y.o.   MRN: [id number]  HPI  Patient here for a scheduled follow up.  She reports she is doing relatively well. Saw cardiology 01/2018.  No changes made.  Recommended f/u in 3 months.  Recommended f/u in CHF clinic.  Overall she is doing better.  Is line dancing.  Still reports some increased fatigue, but overall better than previous check.  No chest pain.  Using inhalers.  No increased cough or congestion.  No acid reflux.  No abdominal pain.  Bowels moving.  States her blood pressure has been averaging 121-975O systolic.  Has persistent back lesion.  Request referral to dermatology.      Past Medical History:  Diagnosis Date  . Anemia   . Asthma   . Bladder cancer ([place])   . BRCA positive   . CAD (coronary artery disease)   . CHF (congestive heart failure) ([place])   . COPD (chronic obstructive pulmonary disease) ([place])   . Emphysema lung ([place])   . GERD (gastroesophageal reflux disease)   . History of colon polyps   . Hypercholesterolemia   . Hyperglycemia   . Hypertension   . Lung nodules   . Osteoporosis   . Personal history of tobacco use, presenting hazards to health 11/25/2014  . Polycythemia vera(238.4)   . Renal cyst   . Skin cancer    Past Surgical History:  Procedure Laterality Date  . ANGIOPLASTY     coronary (x1)  . COLONOSCOPY WITH PROPOFOL N/A 03/02/2015   Procedure: COLONOSCOPY WITH PROPOFOL;  Surgeon: Lollie Sails, MD;  Location: [facility] ENDOSCOPY;  Service: Endoscopy;  Laterality: N/A;  . COLONOSCOPY WITH PROPOFOL N/A 02/13/2017   Procedure: COLONOSCOPY WITH PROPOFOL;  Surgeon: [name], MD;  Location: [facility] ENDOSCOPY;  Service: Endoscopy;  Laterality: N/A;  . CORONARY ANGIOPLASTY    . CORONARY ARTERY BYPASS GRAFT  09/17/2017   pt denies  . CYSTOSCOPY W/ RETROGRADES Bilateral 02/13/2017   Procedure: CYSTOSCOPY  WITH RETROGRADE PYELOGRAM;  Surgeon: Hollice Espy, MD;  Location: [facility] ORS;  Service: Urology;  Laterality: Bilateral;  . CYSTOSCOPY W/ RETROGRADES Bilateral 09/19/2017   Procedure: CYSTOSCOPY WITH RETROGRADE PYELOGRAM;  Surgeon: Hollice Espy, MD;  Location: [facility] ORS;  Service: Urology;  Laterality: Bilateral;  . CYSTOSCOPY WITH BIOPSY  03/26/2017   Procedure: CYSTOSCOPY WITH BIOPSY;  Surgeon: Hollice Espy, MD;  Location: [facility] ORS;  Service: Urology;;  . Consuela Mimes WITH STENT PLACEMENT Bilateral 02/13/2017   Procedure: CYSTOSCOPY WITH STENT PLACEMENT;  Surgeon: Hollice Espy, MD;  Location: [facility] ORS;  Service: Urology;  Laterality: Bilateral;  . CYSTOSCOPY WITH STENT PLACEMENT Bilateral 03/26/2017   Procedure: CYSTOSCOPY WITH STENT EXCHANGE;  Surgeon: Hollice Espy, MD;  Location: [facility] ORS;  Service: Urology;  Laterality: Bilateral;  . ESOPHAGOGASTRODUODENOSCOPY (EGD) WITH PROPOFOL N/A 02/13/2017   Procedure: ESOPHAGOGASTRODUODENOSCOPY (EGD) WITH PROPOFOL;  Surgeon: [name], MD;  Location: [facility] ENDOSCOPY;  Service: Endoscopy;  Laterality: N/A;  . PORTA CATH INSERTION N/A 02/28/2017   Procedure: PORTA CATH INSERTION;  Surgeon: Algernon Huxley, MD;  Location: [place] CV LAB;  Service: Cardiovascular;  Laterality: N/A;  . PORTA CATH REMOVAL N/A 12/24/2017   Procedure: PORTA CATH REMOVAL;  Surgeon: Algernon Huxley, MD;  Location: [place] CV LAB;  Service: Cardiovascular;  Laterality: N/A;  . SALPINGOOPHORECTOMY    .  TONSILECTOMY/ADENOIDECTOMY WITH MYRINGOTOMY    . TONSILLECTOMY    . TRANSURETHRAL RESECTION OF BLADDER TUMOR N/A 02/13/2017   Procedure: TRANSURETHRAL RESECTION OF BLADDER TUMOR (TURBT);  Surgeon: Hollice Espy, MD;  Location: [facility] ORS;  Service: Urology;  Laterality: N/A;  . TUBAL LIGATION    . UPPER GI ENDOSCOPY  02/13/2017  . URETEROSCOPY Left 09/19/2017   Procedure: URETEROSCOPY;  Surgeon: Hollice Espy, MD;  Location: [facility] ORS;  Service: Urology;  Laterality:  Left;  Marland Kitchen VISCERAL ARTERY INTERVENTION N/A 02/12/2017   Procedure: VISCERAL ARTERY INTERVENTION;  Surgeon: Algernon Huxley, MD;  Location: [place] CV LAB;  Service: Cardiovascular;  Laterality: N/A;   Family History  Problem Relation Age of Onset  . Cirrhosis Father        died age 38  . Alcohol abuse Father   . Asthma Mother   . Congestive Heart Failure Mother   . Breast cancer Mother        2 (1/2 sisters)  . Osteoarthritis Mother   . Colon cancer Mother   . Lupus Sister   . Alcohol abuse Sister   . Ovarian cancer Sister   . Osteoporosis Sister   . Skin cancer Sister   . Breast cancer Cousin   . Breast cancer Maternal Aunt    Social History   Socioeconomic History  . Marital status: Widowed    Spouse name: Not on file  . Number of children: 2  . Years of education: Not on file  . Highest education level: Not on file  Occupational History  . Not on file  Social Needs  . Financial resource strain: Not hard at all  . Food insecurity:    Worry: Never true    Inability: Never true  . Transportation needs:    Medical: No    Non-medical: No  Tobacco Use  . Smoking status: Former Smoker    Packs/day: 1.00    Years: 45.00    Pack years: 45.00    Types: Cigarettes    Last attempt to quit: 11/11/2003    Years since quitting: 14.3  . Smokeless tobacco: Never Used  Substance and Sexual Activity  . Alcohol use: No    Alcohol/week: 0.0 standard drinks    Comment: occasional  . Drug use: No  . Sexual activity: Never  Lifestyle  . Physical activity:    Days per week: 2 days    Minutes per session: 90 min  . Stress: Not at all  Relationships  . Social connections:    Talks on phone: Not on file    Gets together: Not on file    Attends religious service: Not on file    Active member of club or organization: Not on file    Attends meetings of clubs or organizations: Not on file    Relationship status: Not on file  Other Topics Concern  . Not on file  Social  History Narrative  . Not on file    Outpatient Encounter Medications as of 02/25/2018  Medication Sig  . albuterol (PROVENTIL HFA;VENTOLIN HFA) 108 (90 Base) MCG/ACT inhaler Inhale 2 puffs into the lungs every 6 (six) hours as needed for wheezing or shortness of breath.  Marland Kitchen amLODipine (NORVASC) 5 MG tablet Take 1 tablet (5 mg total) by mouth 2 (two) times daily.  Marland Kitchen amLODipine (NORVASC) 5 MG tablet TAKE 1 TABLET BY MOUTH TWO  TIMES DAILY  . cholecalciferol (VITAMIN D) 1000 units tablet Take 1,000 Units by mouth daily.  Marland Kitchen  ELIQUIS 5 MG TABS tablet TAKE 1 TABLET BY MOUTH TWO  TIMES DAILY  . fluticasone (FLONASE) 50 MCG/ACT nasal spray Place 2 sprays into both nostrils daily as needed for allergies.  Marland Kitchen ipratropium-albuterol (DUONEB) 0.5-2.5 (3) MG/3ML SOLN Take 3 mLs by nebulization every 6 (six) hours as needed (for shortness of breath).  . losartan (COZAAR) 100 MG tablet TAKE 144m BY MOUTH  DAILY  . lovastatin (MEVACOR) 40 MG tablet TAKE 1 TABLET BY MOUTH  DAILY (evening)  . metoprolol tartrate (LOPRESSOR) 25 MG tablet TAKE 1 TABLET BY MOUTH  TWICE A DAY  . potassium chloride (K-DUR,KLOR-CON) 10 MEQ tablet Take 1 tablet (10 mEq total) by mouth daily.  . SYMBICORT 160-4.5 MCG/ACT inhaler USE 2 PUFFS TWO TIMES DAILY  . torsemide (DEMADEX) 20 MG tablet Take 20 mg by mouth every morning.   Facility-Administered Encounter Medications as of 02/25/2018  Medication  . heparin lock flush 100 unit/mL  . sodium chloride flush (NS) 0.9 % injection 10 mL    Review of Systems  Constitutional: Positive for fatigue. Negative for appetite change and unexpected weight change.  HENT: Negative for congestion and sinus pressure.   Respiratory: Negative for cough and chest tightness.        Breathing stable.    Cardiovascular: Negative for chest pain, palpitations and leg swelling.  Gastrointestinal: Negative for abdominal pain, diarrhea, nausea and vomiting.  Genitourinary: Negative for difficulty urinating  and dysuria.  Musculoskeletal: Negative for joint swelling and myalgias.  Skin: Negative for color change and rash.  Neurological: Negative for dizziness, light-headedness and headaches.  Psychiatric/Behavioral: Negative for agitation and dysphoric mood.       Objective:    Physical Exam  Constitutional: She appears well-developed and well-nourished. No distress.  HENT:  Nose: Nose normal.  Mouth/Throat: Oropharynx is clear and moist.  Neck: Neck supple. No thyromegaly present.  Cardiovascular: Normal rate and regular rhythm.  Pulmonary/Chest: Breath sounds normal. No respiratory distress. She has no wheezes.  Abdominal: Soft. Bowel sounds are normal. There is no tenderness.  Musculoskeletal: She exhibits no edema or tenderness.  Lymphadenopathy:    She has no cervical adenopathy.  Skin: No rash noted. No erythema.  Psychiatric: She has a normal mood and affect. Her behavior is normal.    BP (!) 146/62   Pulse 76   Temp 98.7 F (37.1 C) (Oral)   Ht _0  (1.651 m)   Wt 135 lb 12.8 oz (61.6 kg)   SpO2 93%   BMI 22.60 kg/m  Wt Readings from Last 3 Encounters:  02/25/18 135 lb 12.8 oz (61.6 kg)  12/24/17 134 lb (60.8 kg)  12/21/17 134 lb 6 oz (61 kg)     Lab Results  Component Value Date   WBC 11.2 (H) 12/10/2017   HGB 15.4 12/10/2017   HCT 44.5 12/10/2017   PLT 166 12/10/2017   GLUCOSE 105 (H) 12/10/2017   CHOL 158 02/16/2016   TRIG 86.0 02/16/2016   HDL 60.10 02/16/2016   LDLCALC 81 02/16/2016   ALT 12 12/10/2017   AST 20 12/10/2017   NA 140 12/10/2017   K 3.8 12/10/2017   CL 105 12/10/2017   CREATININE 1.06 (H) 12/10/2017   BUN 21 12/10/2017   CO2 26 12/10/2017   TSH 1.61 02/08/2017   HGBA1C 5.1 05/25/2017       Assessment & Plan:   Problem List Items Addressed This Visit    CAD (coronary artery disease)    Followed by cardiology.  Back - line dancing.  Last evaluated 01/2018.  Recommended f/u in 36month.        CHF (congestive heart failure)  (HCC)    Doing well on current regimen.  No evidence of volume overload on exam.  Follow.  Continue current medication regimen.        CKD (chronic kidney disease), stage III ([place]    Followed by nephrology.  Last evaluated 01/2018.  No changes made.  Recommended f/u in 05/2018.  Stable.  Follow metabolic panel.        Relevant Orders   Basic metabolic panel   COPD (chronic obstructive pulmonary disease) ([place]    Breathing better.  Continue current inhaler regimen.  Breathing stable.        Essential hypertension    Blood pressure overall improved.  Continue same medication regimen.  Follow pressures.  Follow metabolic panel.        Relevant Orders   TSH   Hypercholesterolemia    On lovastatin.  Follow lipid panel and liver function tests.        Relevant Orders   Hepatic function panel   Lipid panel   Hyperglycemia    Low carb diet and exercise.  Follow met b and a1c.        Relevant Orders   Hemoglobin A1c   Polycythemia vera ([place]    Followed by hematology.        Other Visit Diagnoses    Skin lesion of back    -  Primary   Relevant Orders   Ambulatory referral to Dermatology       CEinar Pheasant MD

## 2018-03-04 NOTE — Assessment & Plan Note (Signed)
Followed by hematology 

## 2018-03-04 NOTE — Assessment & Plan Note (Signed)
Low carb diet and exercise.  Follow met b and a1c.

## 2018-03-04 NOTE — Assessment & Plan Note (Signed)
Followed by nephrology.  Last evaluated 01/2018.  No changes made.  Recommended f/u in 05/2018.  Stable.  Follow metabolic panel.

## 2018-03-04 NOTE — Assessment & Plan Note (Signed)
Blood pressure overall improved.  Continue same medication regimen.  Follow pressures.  Follow metabolic panel.

## 2018-03-04 NOTE — Assessment & Plan Note (Addendum)
Breathing better.  Continue current inhaler regimen.  Breathing stable.

## 2018-03-04 NOTE — Assessment & Plan Note (Signed)
On lovastatin.  Follow lipid panel and liver function tests.

## 2018-03-04 NOTE — Assessment & Plan Note (Signed)
Followed by cardiology.  Back - line dancing.  Last evaluated 01/2018.  Recommended f/u in 33months.

## 2018-03-04 NOTE — Assessment & Plan Note (Signed)
Doing well on current regimen.  No evidence of volume overload on exam.  Follow.  Continue current medication regimen.

## 2018-03-05 DIAGNOSIS — H04123 Dry eye syndrome of bilateral lacrimal glands: Secondary | ICD-10-CM | POA: Diagnosis not present

## 2018-03-08 ENCOUNTER — Ambulatory Visit
Admission: RE | Admit: 2018-03-08 | Discharge: 2018-03-08 | Disposition: A | Discharge status: Home / Self Care | Payer: Medicare Other | Source: Ambulatory Visit | Attending: Internal Medicine | Admitting: Internal Medicine

## 2018-03-08 DIAGNOSIS — Z1239 Encounter for other screening for malignant neoplasm of breast: Secondary | ICD-10-CM | POA: Insufficient documentation

## 2018-03-08 DIAGNOSIS — R928 Other abnormal and inconclusive findings on diagnostic imaging of breast: Secondary | ICD-10-CM | POA: Insufficient documentation

## 2018-03-22 ENCOUNTER — Ambulatory Visit: Payer: Medicare Other | Attending: Family | Admitting: Family

## 2018-03-22 ENCOUNTER — Encounter: Payer: Self-pay | Admitting: Family

## 2018-03-22 VITALS — BP 176/65 | HR 67 | Resp 18 | Ht 65.0 in | Wt 139.2 lb

## 2018-03-22 DIAGNOSIS — Z87891 Personal history of nicotine dependence: Secondary | ICD-10-CM | POA: Insufficient documentation

## 2018-03-22 DIAGNOSIS — Z85828 Personal history of other malignant neoplasm of skin: Secondary | ICD-10-CM | POA: Diagnosis not present

## 2018-03-22 DIAGNOSIS — Z8249 Family history of ischemic heart disease and other diseases of the circulatory system: Secondary | ICD-10-CM | POA: Insufficient documentation

## 2018-03-22 DIAGNOSIS — Z7951 Long term (current) use of inhaled steroids: Secondary | ICD-10-CM | POA: Insufficient documentation

## 2018-03-22 DIAGNOSIS — I5032 Chronic diastolic (congestive) heart failure: Secondary | ICD-10-CM

## 2018-03-22 DIAGNOSIS — E7800 Pure hypercholesterolemia, unspecified: Secondary | ICD-10-CM | POA: Diagnosis not present

## 2018-03-22 DIAGNOSIS — Z79899 Other long term (current) drug therapy: Secondary | ICD-10-CM | POA: Insufficient documentation

## 2018-03-22 DIAGNOSIS — D649 Anemia, unspecified: Secondary | ICD-10-CM | POA: Insufficient documentation

## 2018-03-22 DIAGNOSIS — N189 Chronic kidney disease, unspecified: Secondary | ICD-10-CM | POA: Insufficient documentation

## 2018-03-22 DIAGNOSIS — I10 Essential (primary) hypertension: Secondary | ICD-10-CM

## 2018-03-22 DIAGNOSIS — I130 Hypertensive heart and chronic kidney disease with heart failure and stage 1 through stage 4 chronic kidney disease, or unspecified chronic kidney disease: Secondary | ICD-10-CM | POA: Insufficient documentation

## 2018-03-22 DIAGNOSIS — J449 Chronic obstructive pulmonary disease, unspecified: Secondary | ICD-10-CM | POA: Insufficient documentation

## 2018-03-22 DIAGNOSIS — Z7902 Long term (current) use of antithrombotics/antiplatelets: Secondary | ICD-10-CM | POA: Diagnosis not present

## 2018-03-22 DIAGNOSIS — Z951 Presence of aortocoronary bypass graft: Secondary | ICD-10-CM | POA: Insufficient documentation

## 2018-03-22 DIAGNOSIS — K219 Gastro-esophageal reflux disease without esophagitis: Secondary | ICD-10-CM | POA: Diagnosis not present

## 2018-03-22 DIAGNOSIS — Z8551 Personal history of malignant neoplasm of bladder: Secondary | ICD-10-CM | POA: Insufficient documentation

## 2018-03-22 DIAGNOSIS — I509 Heart failure, unspecified: Secondary | ICD-10-CM | POA: Diagnosis not present

## 2018-03-22 DIAGNOSIS — I2510 Atherosclerotic heart disease of native coronary artery without angina pectoris: Secondary | ICD-10-CM | POA: Diagnosis not present

## 2018-03-22 NOTE — Progress Notes (Signed)
[name], female    DOB: Sep 23, 1934, 83 y.o.   MRN: [id number]  HPI  Tina [name] is a 83 y/o female with a history of asthma, bladder cancer, CAD, hyperlipidemia, HTN, CKD, COPD, GERD, anemia, previous tobacco use and chronic heart failure.   Echo report from 10/20/17 reviewed and showed an EF of 60-65% along with mild MR.   Admitted 10/20/17 due to acute hypoxic respiratory failure due to COPD/HF exacerbation. Cardiology consult obtained. Initially needed IV lasix and then transitioned to oral diuretics. Discharged after 2 days.   She presents today for a follow-up visit with a chief complaint of moderate shortness of breath upon minimal exertion. She describes this as chronic in nature having been present for several years although she does feel like it's a little worse with the weather changes. She has associated fatigue, pedal edema, palpitations and light-headedness along with this. She denies any difficulty sleeping, abdominal distention, chest pain, cough or weight gain. Says that her home blood pressure ranges from 140-150/70's.   Past Medical History:  Diagnosis Date  . Anemia   . Asthma   . Bladder cancer ([place])   . BRCA positive   . CAD (coronary artery disease)   . CHF (congestive heart failure) ([place])   . COPD (chronic obstructive pulmonary disease) ([place])   . Emphysema lung ([place])   . GERD (gastroesophageal reflux disease)   . History of colon polyps   . Hypercholesterolemia   . Hyperglycemia   . Hypertension   . Lung nodules   . Osteoporosis   . Personal history of tobacco use, presenting hazards to health 11/25/2014  . Polycythemia vera(238.4)   . Renal cyst   . Skin cancer    Past Surgical History:  Procedure Laterality Date  . ANGIOPLASTY     coronary (x1)  . COLONOSCOPY WITH PROPOFOL N/A 03/02/2015   Procedure: COLONOSCOPY WITH PROPOFOL;  Surgeon: Lollie Sails, MD;  Location: [facility] ENDOSCOPY;  Service: Endoscopy;  Laterality: N/A;  . COLONOSCOPY  WITH PROPOFOL N/A 02/13/2017   Procedure: COLONOSCOPY WITH PROPOFOL;  Surgeon: [name], MD;  Location: [facility] ENDOSCOPY;  Service: Endoscopy;  Laterality: N/A;  . CORONARY ANGIOPLASTY    . CORONARY ARTERY BYPASS GRAFT  09/17/2017   pt denies  . CYSTOSCOPY W/ RETROGRADES Bilateral 02/13/2017   Procedure: CYSTOSCOPY WITH RETROGRADE PYELOGRAM;  Surgeon: Hollice Espy, MD;  Location: [facility] ORS;  Service: Urology;  Laterality: Bilateral;  . CYSTOSCOPY W/ RETROGRADES Bilateral 09/19/2017   Procedure: CYSTOSCOPY WITH RETROGRADE PYELOGRAM;  Surgeon: Hollice Espy, MD;  Location: [facility] ORS;  Service: Urology;  Laterality: Bilateral;  . CYSTOSCOPY WITH BIOPSY  03/26/2017   Procedure: CYSTOSCOPY WITH BIOPSY;  Surgeon: Hollice Espy, MD;  Location: [facility] ORS;  Service: Urology;;  . Consuela Mimes WITH STENT PLACEMENT Bilateral 02/13/2017   Procedure: CYSTOSCOPY WITH STENT PLACEMENT;  Surgeon: Hollice Espy, MD;  Location: [facility] ORS;  Service: Urology;  Laterality: Bilateral;  . CYSTOSCOPY WITH STENT PLACEMENT Bilateral 03/26/2017   Procedure: CYSTOSCOPY WITH STENT EXCHANGE;  Surgeon: Hollice Espy, MD;  Location: [facility] ORS;  Service: Urology;  Laterality: Bilateral;  . ESOPHAGOGASTRODUODENOSCOPY (EGD) WITH PROPOFOL N/A 02/13/2017   Procedure: ESOPHAGOGASTRODUODENOSCOPY (EGD) WITH PROPOFOL;  Surgeon: [name], MD;  Location: [facility] ENDOSCOPY;  Service: Endoscopy;  Laterality: N/A;  . PORTA CATH INSERTION N/A 02/28/2017   Procedure: PORTA CATH INSERTION;  Surgeon: Algernon Huxley, MD;  Location: [place] CV LAB;  Service: Cardiovascular;  Laterality: N/A;  . PORTA CATH REMOVAL  N/A 12/24/2017   Procedure: PORTA CATH REMOVAL;  Surgeon: Algernon Huxley, MD;  Location: [place] CV LAB;  Service: Cardiovascular;  Laterality: N/A;  . SALPINGOOPHORECTOMY    . TONSILECTOMY/ADENOIDECTOMY WITH MYRINGOTOMY    . TONSILLECTOMY    . TRANSURETHRAL RESECTION OF BLADDER TUMOR N/A 02/13/2017   Procedure: TRANSURETHRAL  RESECTION OF BLADDER TUMOR (TURBT);  Surgeon: Hollice Espy, MD;  Location: [facility] ORS;  Service: Urology;  Laterality: N/A;  . TUBAL LIGATION    . UPPER GI ENDOSCOPY  02/13/2017  . URETEROSCOPY Left 09/19/2017   Procedure: URETEROSCOPY;  Surgeon: Hollice Espy, MD;  Location: [facility] ORS;  Service: Urology;  Laterality: Left;  Marland Kitchen VISCERAL ARTERY INTERVENTION N/A 02/12/2017   Procedure: VISCERAL ARTERY INTERVENTION;  Surgeon: Algernon Huxley, MD;  Location: [place] CV LAB;  Service: Cardiovascular;  Laterality: N/A;   Family History  Problem Relation Age of Onset  . Cirrhosis Father        died age 74  . Alcohol abuse Father   . Asthma Mother   . Congestive Heart Failure Mother   . Breast cancer Mother        2 (1/2 sisters)  . Osteoarthritis Mother   . Colon cancer Mother   . Lupus Sister   . Alcohol abuse Sister   . Ovarian cancer Sister   . Osteoporosis Sister   . Skin cancer Sister   . Breast cancer Cousin   . Breast cancer Maternal Aunt    Social History   Tobacco Use  . Smoking status: Former Smoker    Packs/day: 1.00    Years: 45.00    Pack years: 45.00    Types: Cigarettes    Last attempt to quit: 11/11/2003    Years since quitting: 14.3  . Smokeless tobacco: Never Used  Substance Use Topics  . Alcohol use: No    Alcohol/week: 0.0 standard drinks    Comment: occasional   Allergies  Allergen Reactions  . Evista [Raloxifene] Other (See Comments)    Night sweats   . Fluticasone-Salmeterol Other (See Comments)    Cough, "chokes me"    Prior to Admission medications   Medication Sig Start Date End Date Taking? Authorizing Provider  albuterol (PROVENTIL HFA;VENTOLIN HFA) 108 (90 Base) MCG/ACT inhaler Inhale 2 puffs into the lungs every 6 (six) hours as needed for wheezing or shortness of breath. 10/22/17  Yes Mody, Ulice Bold, MD  amLODipine (NORVASC) 5 MG tablet Take 1 tablet (5 mg total) by mouth 2 (two) times daily. 10/22/17  Yes Bettey Costa, MD  cholecalciferol  (VITAMIN D) 1000 units tablet Take 1,000 Units by mouth daily.   Yes [provider]  clopidogrel (PLAVIX) 75 MG tablet Take 75 mg by mouth daily.   Yes [provider]  fluticasone (FLONASE) 50 MCG/ACT nasal spray Place 2 sprays into both nostrils daily as needed for allergies. 09/24/17  Yes Einar Pheasant, MD  ipratropium-albuterol (DUONEB) 0.5-2.5 (3) MG/3ML SOLN Take 3 mLs by nebulization every 6 (six) hours as needed (for shortness of breath). 11/14/14  Yes Joanne Gavel, MD  losartan (COZAAR) 100 MG tablet TAKE 160m BY MOUTH  DAILY 10/22/17  Yes Mody, Sital, MD  lovastatin (MEVACOR) 40 MG tablet TAKE 1 TABLET BY MOUTH  DAILY (evening) 01/30/18  Yes SEinar Pheasant MD  metoprolol tartrate (LOPRESSOR) 25 MG tablet TAKE 1 TABLET BY MOUTH  TWICE A DAY 02/06/18  Yes SEinar Pheasant MD  OVER THE COUNTER MEDICATION daily.   Yes [provider]  potassium chloride (K-DUR,KLOR-CON) 10 MEQ tablet Take 1 tablet (10 mEq total) by mouth daily. 10/22/17  Yes Bettey Costa, MD  SYMBICORT 160-4.5 MCG/ACT inhaler USE 2 PUFFS TWO TIMES DAILY 02/06/18  Yes Einar Pheasant, MD  torsemide (DEMADEX) 20 MG tablet Take 20 mg by mouth every morning. 12/12/17  Yes [provider]  amLODipine (NORVASC) 5 MG tablet TAKE 1 TABLET BY MOUTH TWO  TIMES DAILY 02/06/18   Einar Pheasant, MD    Review of Systems  Constitutional: Positive for fatigue (with moderate exertion). Negative for appetite change.  HENT: Negative for congestion, rhinorrhea and sore throat.   Eyes: Negative.   Respiratory: Positive for shortness of breath (with minimal exertion). Negative for cough and chest tightness.   Cardiovascular: Positive for palpitations (at times) and leg swelling ("better"). Negative for chest pain.  Gastrointestinal: Negative for abdominal distention and abdominal pain.  Endocrine: Negative.   Genitourinary: Negative.   Musculoskeletal: Negative for back pain and neck pain.  Skin: Negative.    Allergic/Immunologic: Negative.   Neurological: Positive for light-headedness ("wobbly"). Negative for dizziness.  Hematological: Negative for adenopathy. Bruises/bleeds easily.  Psychiatric/Behavioral: Negative for dysphoric mood and sleep disturbance (sleeping on 1 pillow). The patient is not nervous/anxious.    Vitals:   03/22/18 0900  BP: (!) 176/65  Pulse: 67  Resp: 18  SpO2: 95%  Weight: 139 lb 4 oz (63.2 kg)  Height: '5\' 5"'  (1.651 m)   Wt Readings from Last 3 Encounters:  03/22/18 139 lb 4 oz (63.2 kg)  02/25/18 135 lb 12.8 oz (61.6 kg)  12/24/17 134 lb (60.8 kg)   Lab Results  Component Value Date   CREATININE 1.06 (H) 12/10/2017   CREATININE 1.13 11/08/2017   CREATININE 1.10 10/31/2017    Physical Exam  Constitutional: She is oriented to person, place, and time. She appears well-developed and well-nourished.  HENT:  Head: Normocephalic and atraumatic.  Neck: Normal range of motion. Neck supple. No JVD present.  Cardiovascular: Normal rate and regular rhythm.  Pulmonary/Chest: Effort normal. No respiratory distress. She has no wheezes. She has no rales.  Abdominal: Soft. She exhibits no distension.  Musculoskeletal: She exhibits edema (trace pitting edema in bilateral lower legs). She exhibits no tenderness.  Neurological: She is alert and oriented to person, place, and time.  Skin: Skin is warm and dry. Bruising (both arms) noted.  Psychiatric: She has a normal mood and affect. Her behavior is normal. Thought content normal.  Nursing note and vitals reviewed.  Assessment & Plan:  1: Chronic heart failure with preserved ejection fraction- - NYHA class III - euvolemic today with pedal edema - weighing daily; Reminded to call for an overnight weight gain of >2 pounds or a weekly weight gain of >5 pounds - weight up 5 pounds since last visit here 3 months ago - not adding salt and has been reading food labels occasionally. Reminded to closely follow a 2044m  sodium diet - saw cardiology (Margarito Courser 01/31/18 - wearing support pantyhose and says that after she elevates her legs, the edema goes down. No swelling when she first wakes up in the mornings - encouraged her to get compression socks and wear them daily with removal at bedtime - is quite active and line dances twice weekly - BNP 10/20/17 was 517.0 - patient reports receiving her flu vaccine for this season - PharmD reconciled medications with the patient  2: HTN- - BP elevated today although she says that her home BP runs 140-150's/  70's - saw PCP Nicki Reaper) 02/25/18 - BMP on 12/10/17 reviewed and showed sodium 140, potassium 3.8 and GFR 48 - sees nephrology Candiss Norse)   Patient did not bring her medications nor a list. Each medication was verbally reviewed with the patient and she was encouraged to bring the bottles to every visit to confirm accuracy of list.  Patient opts to not make a return appointment at this time. Advised her that she could call back at anytime to make another appointment.

## 2018-03-22 NOTE — Patient Instructions (Signed)
Continue weighing daily and call for an overnight weight gain of > 2 pounds or a weekly weight gain of >5 pounds. 

## 2018-03-29 ENCOUNTER — Telehealth: Payer: Self-pay

## 2018-03-29 ENCOUNTER — Other Ambulatory Visit: Payer: Self-pay | Admitting: Internal Medicine

## 2018-03-29 MED ORDER — CLOPIDOGREL BISULFATE 75 MG PO TABS: 75.0000 mg | ORAL_TABLET | Freq: Every day | ORAL | 0 refills | Status: AC

## 2018-03-29 NOTE — Progress Notes (Signed)
rx sent in to [facility] for plavix #30 with no refills.  Please clarify with pt if she needs rx sent to [facility].

## 2018-03-29 NOTE — Telephone Encounter (Signed)
Need to clarify where to send plavix rx.

## 2018-03-29 NOTE — Telephone Encounter (Signed)
Left message for patient to return call back. PEC may give and obtain information from DR Nicki Reaper.

## 2018-03-29 NOTE — Telephone Encounter (Signed)
Copied from [place] [id number]. Topic: General - Other >> Mar 29, 2018  4:26 PM Keene Breath wrote: Reason for CRM: Patient called to request a refill for her medication, clopidogrel (PLAVIX) 75 MG tablet, which is listed as a historical med.  Please advise and call patient back when it is approved.  CB# [id number].

## 2018-04-01 ENCOUNTER — Inpatient Hospital Stay: Payer: Medicare Other

## 2018-04-15 ENCOUNTER — Inpatient Hospital Stay: Payer: Medicare Other | Attending: Oncology

## 2018-04-15 ENCOUNTER — Ambulatory Visit
Admission: RE | Admit: 2018-04-15 | Discharge: 2018-04-15 | Disposition: A | Discharge status: Home / Self Care | Payer: Medicare Other | Source: Ambulatory Visit | Attending: Oncology | Admitting: Oncology

## 2018-04-15 DIAGNOSIS — I110 Hypertensive heart disease with heart failure: Secondary | ICD-10-CM | POA: Insufficient documentation

## 2018-04-15 DIAGNOSIS — Z87891 Personal history of nicotine dependence: Secondary | ICD-10-CM | POA: Diagnosis not present

## 2018-04-15 DIAGNOSIS — Z9221 Personal history of antineoplastic chemotherapy: Secondary | ICD-10-CM | POA: Insufficient documentation

## 2018-04-15 DIAGNOSIS — D751 Secondary polycythemia: Secondary | ICD-10-CM | POA: Insufficient documentation

## 2018-04-15 DIAGNOSIS — C679 Malignant neoplasm of bladder, unspecified: Secondary | ICD-10-CM | POA: Insufficient documentation

## 2018-04-15 DIAGNOSIS — R5383 Other fatigue: Secondary | ICD-10-CM | POA: Insufficient documentation

## 2018-04-15 DIAGNOSIS — I509 Heart failure, unspecified: Secondary | ICD-10-CM | POA: Insufficient documentation

## 2018-04-15 DIAGNOSIS — Z923 Personal history of irradiation: Secondary | ICD-10-CM | POA: Insufficient documentation

## 2018-04-15 DIAGNOSIS — Z79899 Other long term (current) drug therapy: Secondary | ICD-10-CM | POA: Insufficient documentation

## 2018-04-15 DIAGNOSIS — I2510 Atherosclerotic heart disease of native coronary artery without angina pectoris: Secondary | ICD-10-CM | POA: Insufficient documentation

## 2018-04-15 DIAGNOSIS — R2689 Other abnormalities of gait and mobility: Secondary | ICD-10-CM | POA: Insufficient documentation

## 2018-04-15 LAB — CBC WITH DIFFERENTIAL/PLATELET
Abs Immature Granulocytes: 0.03 10*3/uL (ref 0.00–0.07)
BASOS PCT: 0 %
Basophils Absolute: 0.1 10*3/uL (ref 0.0–0.1)
EOS ABS: 0.5 10*3/uL (ref 0.0–0.5)
EOS PCT: 4 %
HCT: 44.9 % (ref 36.0–46.0)
Hemoglobin: 15.4 g/dL — ABNORMAL HIGH (ref 12.0–15.0)
Immature Granulocytes: 0 %
LYMPHS ABS: 1.8 10*3/uL (ref 0.7–4.0)
Lymphocytes Relative: 16 %
MCH: 32 pg (ref 26.0–34.0)
MCHC: 34.3 g/dL (ref 30.0–36.0)
MCV: 93.3 fL (ref 80.0–100.0)
MONO ABS: 1 10*3/uL (ref 0.1–1.0)
Monocytes Relative: 9 %
Neutro Abs: 7.9 10*3/uL — ABNORMAL HIGH (ref 1.7–7.7)
Neutrophils Relative %: 71 %
PLATELETS: 175 10*3/uL (ref 150–400)
RBC: 4.81 MIL/uL (ref 3.87–5.11)
RDW: 12.2 % (ref 11.5–15.5)
WBC: 11.2 10*3/uL — ABNORMAL HIGH (ref 4.0–10.5)
nRBC: 0 % (ref 0.0–0.2)

## 2018-04-15 LAB — COMPREHENSIVE METABOLIC PANEL
ALBUMIN: 4.7 g/dL (ref 3.5–5.0)
ALK PHOS: 63 U/L (ref 38–126)
ALT: 16 U/L (ref 0–44)
AST: 23 U/L (ref 15–41)
Anion gap: 9 (ref 5–15)
BUN: 31 mg/dL — ABNORMAL HIGH (ref 8–23)
CALCIUM: 10 mg/dL (ref 8.9–10.3)
CO2: 29 mmol/L (ref 22–32)
CREATININE: 1.31 mg/dL — AB (ref 0.44–1.00)
Chloride: 102 mmol/L (ref 98–111)
GFR calc non Af Amer: 38 mL/min — ABNORMAL LOW (ref >60)
GFR, EST AFRICAN AMERICAN: 44 mL/min — AB (ref >60)
GLUCOSE: 146 mg/dL — AB (ref 70–99)
Potassium: 3.9 mmol/L (ref 3.5–5.1)
SODIUM: 140 mmol/L (ref 135–145)
Total Bilirubin: 1 mg/dL (ref 0.3–1.2)
Total Protein: 7.6 g/dL (ref 6.5–8.1)

## 2018-04-15 MED ORDER — IOHEXOL 300 MG/ML  SOLN
75.0000 mL | Freq: Once | INTRAMUSCULAR | Status: AC | PRN
  Administered 2018-04-15: 75 mL via INTRAVENOUS

## 2018-04-16 ENCOUNTER — Telehealth: Payer: Self-pay | Admitting: *Deleted

## 2018-04-16 ENCOUNTER — Inpatient Hospital Stay (HOSPITAL_BASED_OUTPATIENT_CLINIC_OR_DEPARTMENT_OTHER): Payer: Medicare Other | Admitting: Oncology

## 2018-04-16 ENCOUNTER — Encounter: Payer: Self-pay | Admitting: Oncology

## 2018-04-16 ENCOUNTER — Inpatient Hospital Stay: Payer: Medicare Other

## 2018-04-16 VITALS — BP 153/63 | HR 61 | Temp 97.9°F | Resp 16 | Ht 65.0 in | Wt 137.3 lb

## 2018-04-16 DIAGNOSIS — Z79899 Other long term (current) drug therapy: Secondary | ICD-10-CM

## 2018-04-16 DIAGNOSIS — R2689 Other abnormalities of gait and mobility: Secondary | ICD-10-CM | POA: Diagnosis not present

## 2018-04-16 DIAGNOSIS — C679 Malignant neoplasm of bladder, unspecified: Secondary | ICD-10-CM | POA: Diagnosis not present

## 2018-04-16 DIAGNOSIS — I110 Hypertensive heart disease with heart failure: Secondary | ICD-10-CM | POA: Diagnosis not present

## 2018-04-16 DIAGNOSIS — Z87891 Personal history of nicotine dependence: Secondary | ICD-10-CM

## 2018-04-16 DIAGNOSIS — Z9221 Personal history of antineoplastic chemotherapy: Secondary | ICD-10-CM | POA: Diagnosis not present

## 2018-04-16 DIAGNOSIS — R5383 Other fatigue: Secondary | ICD-10-CM

## 2018-04-16 DIAGNOSIS — Z923 Personal history of irradiation: Secondary | ICD-10-CM

## 2018-04-16 DIAGNOSIS — I509 Heart failure, unspecified: Secondary | ICD-10-CM | POA: Diagnosis not present

## 2018-04-16 DIAGNOSIS — I2510 Atherosclerotic heart disease of native coronary artery without angina pectoris: Secondary | ICD-10-CM | POA: Diagnosis not present

## 2018-04-16 DIAGNOSIS — D751 Secondary polycythemia: Secondary | ICD-10-CM

## 2018-04-16 NOTE — Progress Notes (Signed)
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 She has had jak 2 testing as well as EPO levels in the past which was unremarkable. She gets a phlebotomy when her hematocrit is more than 50. She was last seen by me in June 2018. She also has a history of lung nodules and was getting low-dose lung cancer screening scans on 07/04/2014 when her insurance stopped paying.   She presented to the hospital in October 2018 with symptoms of nausea and diarrhea weight loss and fatigue. She was noted to have irregular wall thickening in the ascending colon suspicious for colon cancer as well as 4.2 x 3.2 cm posterior bladder wall mass that was incidentally noted. She was also seen by vascular surgery and underwent abdominal angiogram with stent placement at the [facility]. She is on aspirin and Plavix for the same. Patient underwent colonoscopy for concerns of ischemic colitis which showed irregular thickening of the descending colon consistent with ischemic colitis. EGD showed a small hiatal hernia. Losing gastric ulcers were noted which was treated with argon photocoagulation.  With regards to the posterior bladder wall mass she was seen by Dr. Erlene Quan and underwent TURBT with bilateral ureteral stent placement on 02/13/2017. Pathology showed small cell carcinoma with muscularis propria invasion. 4 cycles of carbo/etoposide completed on 12/261/8. She completed RT following that  as she was not deemed to be a surgical candidate. She remains in remission  Incidentally found to have PE on scans done in jan 2019   Interval history-overall patient is doing well but reports some ongoing fatigue and at times has trouble maintaining her balance.  She continues to do line dancing but is not able to sustain it for prolonged period of time.  She denies other complaints  ECOG PS- 2 Pain scale- 0 Opioid associated constipation- no  Review of systems- Review of Systems  Constitutional: Positive for malaise/fatigue. Negative for chills, fever and weight loss.  HENT: Negative for congestion, ear discharge and nosebleeds.   Eyes: Negative for blurred vision.  Respiratory: Negative for cough, hemoptysis, sputum production, shortness of breath and wheezing.   Cardiovascular: Negative for chest pain, palpitations, orthopnea and claudication.  Gastrointestinal: Negative for abdominal pain, blood in stool, constipation, diarrhea, heartburn, melena, nausea and vomiting.  Genitourinary: Negative for dysuria, flank pain, frequency, hematuria and urgency.  Musculoskeletal: Negative for back pain, joint pain and myalgias.  Skin: Negative for rash.  Neurological: Negative for dizziness, tingling, focal weakness, seizures, weakness and headaches.  Endo/Heme/Allergies: Does not bruise/bleed easily.  Psychiatric/Behavioral: Negative for depression and suicidal ideas. The patient does not have insomnia.      Allergies  Allergen Reactions  . Evista [Raloxifene] Other (See Comments)    Night sweats   . Fluticasone-Salmeterol Other (See Comments)    Cough, "chokes me"      Past Medical History:  Diagnosis Date  . Anemia   . Asthma   . Bladder cancer ([place])   . BRCA positive   . CAD (coronary artery disease)   .  CHF (congestive heart failure) ([place])   . COPD (chronic obstructive pulmonary disease) ([place])   . Emphysema lung ([place])   . GERD (gastroesophageal reflux disease)   .  History of colon polyps   . Hypercholesterolemia   . Hyperglycemia   . Hypertension   . Lung nodules   . Osteoporosis   . Personal history of tobacco use, presenting hazards to health 11/25/2014  . Polycythemia vera(238.4)   . Renal cyst   . Skin cancer      Past Surgical History:  Procedure Laterality Date  . ANGIOPLASTY     coronary (x1)  . COLONOSCOPY WITH PROPOFOL N/A 03/02/2015   Procedure: COLONOSCOPY WITH PROPOFOL;  Surgeon: Lollie Sails, MD;  Location: [facility] ENDOSCOPY;  Service: Endoscopy;  Laterality: N/A;  . COLONOSCOPY WITH PROPOFOL N/A 02/13/2017   Procedure: COLONOSCOPY WITH PROPOFOL;  Surgeon: [name], MD;  Location: [facility] ENDOSCOPY;  Service: Endoscopy;  Laterality: N/A;  . CORONARY ANGIOPLASTY    . CORONARY ARTERY BYPASS GRAFT  09/17/2017   pt denies  . CYSTOSCOPY W/ RETROGRADES Bilateral 02/13/2017   Procedure: CYSTOSCOPY WITH RETROGRADE PYELOGRAM;  Surgeon: Hollice Espy, MD;  Location: [facility] ORS;  Service: Urology;  Laterality: Bilateral;  . CYSTOSCOPY W/ RETROGRADES Bilateral 09/19/2017   Procedure: CYSTOSCOPY WITH RETROGRADE PYELOGRAM;  Surgeon: Hollice Espy, MD;  Location: [facility] ORS;  Service: Urology;  Laterality: Bilateral;  . CYSTOSCOPY WITH BIOPSY  03/26/2017   Procedure: CYSTOSCOPY WITH BIOPSY;  Surgeon: Hollice Espy, MD;  Location: [facility] ORS;  Service: Urology;;  . Consuela Mimes WITH STENT PLACEMENT Bilateral 02/13/2017   Procedure: CYSTOSCOPY WITH STENT PLACEMENT;  Surgeon: Hollice Espy, MD;  Location: [facility] ORS;  Service: Urology;  Laterality: Bilateral;  . CYSTOSCOPY WITH STENT PLACEMENT Bilateral 03/26/2017   Procedure: CYSTOSCOPY WITH STENT EXCHANGE;  Surgeon: Hollice Espy, MD;  Location: [facility] ORS;  Service: Urology;  Laterality: Bilateral;  . ESOPHAGOGASTRODUODENOSCOPY (EGD) WITH PROPOFOL N/A 02/13/2017   Procedure: ESOPHAGOGASTRODUODENOSCOPY (EGD) WITH PROPOFOL;  Surgeon: [name], MD;  Location[facility] ENDOSCOPY;  Service: Endoscopy;   Laterality: N/A;  . PORTA CATH INSERTION N/A 02/28/2017   Procedure: PORTA CATH INSERTION;  Surgeon: Algernon Huxley, MD;  Location: [place] CV LAB;  Service: Cardiovascular;  Laterality: N/A;  . PORTA CATH REMOVAL N/A 12/24/2017   Procedure: PORTA CATH REMOVAL;  Surgeon: Algernon Huxley, MD;  Location: [place] CV LAB;  Service: Cardiovascular;  Laterality: N/A;  . SALPINGOOPHORECTOMY    . TONSILECTOMY/ADENOIDECTOMY WITH MYRINGOTOMY    . TONSILLECTOMY    . TRANSURETHRAL RESECTION OF BLADDER TUMOR N/A 02/13/2017   Procedure: TRANSURETHRAL RESECTION OF BLADDER TUMOR (TURBT);  Surgeon: Hollice Espy, MD;  Location: [facility] ORS;  Service: Urology;  Laterality: N/A;  . TUBAL LIGATION    . UPPER GI ENDOSCOPY  02/13/2017  . URETEROSCOPY Left 09/19/2017   Procedure: URETEROSCOPY;  Surgeon: Hollice Espy, MD;  Location: [facility] ORS;  Service: Urology;  Laterality: Left;  Marland Kitchen VISCERAL ARTERY INTERVENTION N/A 02/12/2017   Procedure: VISCERAL ARTERY INTERVENTION;  Surgeon: Algernon Huxley, MD;  Location: [place] CV LAB;  Service: Cardiovascular;  Laterality: N/A;    Social History   Socioeconomic History  . Marital status: Widowed    Spouse name: Not on file  . Number of children: 2  . Years of education: Not on file  . Highest education level: Not on file  Occupational History  . Not on file  Social Needs  . Financial resource strain: Not hard at all  . Food  insecurity:    Worry: Never true    Inability: Never true  . Transportation needs:    Medical: No    Non-medical: No  Tobacco Use  . Smoking status: Former Smoker    Packs/day: 1.00    Years: 45.00    Pack years: 45.00    Types: Cigarettes    Last attempt to quit: 11/11/2003    Years since quitting: 14.4  . Smokeless tobacco: Never Used  Substance and Sexual Activity  . Alcohol use: No    Alcohol/week: 0.0 standard drinks    Comment: occasional  . Drug use: No  . Sexual activity: Never  Lifestyle  . Physical activity:     Days per week: 2 days    Minutes per session: 90 min  . Stress: Not at all  Relationships  . Social connections:    Talks on phone: Not on file    Gets together: Not on file    Attends religious service: Not on file    Active member of club or organization: Not on file    Attends meetings of clubs or organizations: Not on file    Relationship status: Not on file  . Intimate partner violence:    Fear of current or ex partner: No    Emotionally abused: No    Physically abused: No    Forced sexual activity: No  Other Topics Concern  . Not on file  Social History Narrative  . Not on file    Family History  Problem Relation Age of Onset  . Cirrhosis Father        died age 42  . Alcohol abuse Father   . Asthma Mother   . Congestive Heart Failure Mother   . Breast cancer Mother        2 (1/2 sisters)  . Osteoarthritis Mother   . Colon cancer Mother   . Lupus Sister   . Alcohol abuse Sister   . Ovarian cancer Sister   . Osteoporosis Sister   . Skin cancer Sister   . Breast cancer Cousin   . Breast cancer Maternal Aunt      Current Outpatient Medications:  .  albuterol (PROVENTIL HFA;VENTOLIN HFA) 108 (90 Base) MCG/ACT inhaler, Inhale 2 puffs into the lungs every 6 (six) hours as needed for wheezing or shortness of breath., Disp: 1 Inhaler, Rfl: 2 .  amLODipine (NORVASC) 5 MG tablet, Take 1 tablet (5 mg total) by mouth 2 (two) times daily., Disp: , Rfl:  .  cholecalciferol (VITAMIN D) 1000 units tablet, Take 1,000 Units by mouth daily., Disp: , Rfl:  .  clopidogrel (PLAVIX) 75 MG tablet, Take 1 tablet (75 mg total) by mouth daily., Disp: 30 tablet, Rfl: 0 .  fluticasone (FLONASE) 50 MCG/ACT nasal spray, Place 2 sprays into both nostrils daily as needed for allergies., Disp: 48 g, Rfl: 1 .  ipratropium-albuterol (DUONEB) 0.5-2.5 (3) MG/3ML SOLN, Take 3 mLs by nebulization every 6 (six) hours as needed (for shortness of breath)., Disp: 36 mL, Rfl: 0 .  losartan (COZAAR) 100 MG  tablet, TAKE 163m BY MOUTH  DAILY, Disp: 90 tablet, Rfl: 1 .  lovastatin (MEVACOR) 40 MG tablet, TAKE 1 TABLET BY MOUTH  DAILY (evening), Disp: 90 tablet, Rfl: 1 .  metoprolol tartrate (LOPRESSOR) 25 MG tablet, TAKE 1 TABLET BY MOUTH  TWICE A DAY, Disp: 180 tablet, Rfl: 1 .  OVER THE COUNTER MEDICATION, daily., Disp: , Rfl:  .  potassium chloride (K-DUR,KLOR-CON) 10 MEQ  tablet, Take 1 tablet (10 mEq total) by mouth daily., Disp: 90 tablet, Rfl: 1 .  SYMBICORT 160-4.5 MCG/ACT inhaler, USE 2 PUFFS TWO TIMES DAILY, Disp: 30.6 g, Rfl: 3 .  torsemide (DEMADEX) 20 MG tablet, Take 20 mg by mouth every morning., Disp: , Rfl: 11 No current facility-administered medications for this visit.   Facility-Administered Medications Ordered in Other Visits:  .  heparin lock flush 100 unit/mL, 500 Units, Intravenous, Once, Sindy Guadeloupe, MD .  sodium chloride flush (NS) 0.9 % injection 10 mL, 10 mL, Intravenous, PRN, Sindy Guadeloupe, MD  Physical exam:  Vitals:   04/16/18 1134  BP: (!) 153/63  Pulse: 61  Resp: 16  Temp: 97.9 F (36.6 C)  TempSrc: Oral  Weight: 137 lb 4.8 oz (62.3 kg)  Height: '5\' 5"'  (1.651 m)   Physical Exam  Constitutional: She is oriented to person, place, and time.  Thin elderly lady in no acute distress  HENT:  Head: Normocephalic and atraumatic.  Eyes: Pupils are equal, round, and reactive to light. EOM are normal.  Neck: Normal range of motion.  Cardiovascular: Normal rate, regular rhythm and normal heart sounds.  Pulmonary/Chest: Effort normal and breath sounds normal.  Abdominal: Soft. Bowel sounds are normal.  Musculoskeletal: She exhibits edema (Trace bilateral edema).  Neurological: She is alert and oriented to person, place, and time.  Grossly no focal neurological deficit  Skin: Skin is warm and dry.  Scattered subcutaneous bruising over forearms     CMP Latest Ref Rng & Units 04/15/2018  Glucose 70 - 99 mg/dL 146(H)  BUN 8 - 23 mg/dL 31(H)  Creatinine 0.44 -  1.00 mg/dL 1.31(H)  Sodium 135 - 145 mmol/L 140  Potassium 3.5 - 5.1 mmol/L 3.9  Chloride 98 - 111 mmol/L 102  CO2 22 - 32 mmol/L 29  Calcium 8.9 - 10.3 mg/dL 10.0  Total Protein 6.5 - 8.1 g/dL 7.6  Total Bilirubin 0.3 - 1.2 mg/dL 1.0  Alkaline Phos 38 - 126 U/L 63  AST 15 - 41 U/L 23  ALT 0 - 44 U/L 16   CBC Latest Ref Rng & Units 04/15/2018  WBC 4.0 - 10.5 K/uL 11.2(H)  Hemoglobin 12.0 - 15.0 g/dL 15.4(H)  Hematocrit 36.0 - 46.0 % 44.9  Platelets 150 - 400 K/uL 175    No images are attached to the encounter.  Ct Chest W Contrast  Result Date: 04/15/2018 CLINICAL DATA:  Invasive small cell bladder cancer. EXAM: CT CHEST, ABDOMEN, AND PELVIS WITH CONTRAST TECHNIQUE: Multidetector CT imaging of the chest, abdomen and pelvis was performed following the standard protocol during bolus administration of intravenous contrast. CONTRAST:  13m OMNIPAQUE IOHEXOL 300 MG/ML  SOLN COMPARISON:  12/10/2017. FINDINGS: CT CHEST FINDINGS Cardiovascular: Heart size upper normal. Coronary artery calcification is evident. Atherosclerotic calcification is noted in the wall of the thoracic aorta. Mediastinum/Nodes: No mediastinal lymphadenopathy. There is no hilar lymphadenopathy. The esophagus has normal imaging features. There is no axillary lymphadenopathy. Lungs/Pleura: The central tracheobronchial airways are patent. Centrilobular emphysema noted. No suspicious pulmonary nodule or mass. No focal airspace consolidation. No pleural effusion. Musculoskeletal: No worrisome lytic or sclerotic osseous abnormality. CT ABDOMEN PELVIS FINDINGS Hepatobiliary: No focal abnormality within the liver parenchyma. There is no evidence for gallstones, gallbladder wall thickening, or pericholecystic fluid. No intrahepatic or extrahepatic biliary dilation. Pancreas: No focal mass lesion. No dilatation of the main duct. No intraparenchymal cyst. No peripancreatic edema. Spleen: No splenomegaly. No focal mass lesion.  Adrenals/Urinary Tract: No  adrenal nodule or mass. Stable appearance bilateral renal cysts. No evidence for hydroureter. The urinary bladder appears normal for the degree of distention. Stomach/Bowel: Stomach is nondistended. No gastric wall thickening. No evidence of outlet obstruction. Duodenum is normally positioned as is the ligament of Treitz. No small bowel wall thickening. No small bowel dilatation. The terminal ileum is normal. The appendix is normal. No gross colonic mass. No colonic wall thickening. Vascular/Lymphatic: There is abdominal aortic atherosclerosis without aneurysm. There is no gastrohepatic or hepatoduodenal ligament lymphadenopathy. No intraperitoneal or retroperitoneal lymphadenopathy. No pelvic sidewall lymphadenopathy. Reproductive: The uterus has normal CT imaging appearance. There is no adnexal mass. Other: No intraperitoneal free fluid. Musculoskeletal: No worrisome lytic or sclerotic osseous abnormality. IMPRESSION: 1. Stable exam. No new or progressive interval findings. Specifically, no evidence for metastatic disease in the chest, abdomen, or pelvis. 2.  Emphysema. (ICD10-J43.9) 3.  Aortic Atherosclerois (ICD10-170.0) Electronically Signed   By: Misty Stanley M.D.   On: 04/15/2018 11:50   Ct Abdomen Pelvis W Contrast  Result Date: 04/15/2018 CLINICAL DATA:  Invasive small cell bladder cancer. EXAM: CT CHEST, ABDOMEN, AND PELVIS WITH CONTRAST TECHNIQUE: Multidetector CT imaging of the chest, abdomen and pelvis was performed following the standard protocol during bolus administration of intravenous contrast. CONTRAST:  81m OMNIPAQUE IOHEXOL 300 MG/ML  SOLN COMPARISON:  12/10/2017. FINDINGS: CT CHEST FINDINGS Cardiovascular: Heart size upper normal. Coronary artery calcification is evident. Atherosclerotic calcification is noted in the wall of the thoracic aorta. Mediastinum/Nodes: No mediastinal lymphadenopathy. There is no hilar lymphadenopathy. The esophagus has normal imaging  features. There is no axillary lymphadenopathy. Lungs/Pleura: The central tracheobronchial airways are patent. Centrilobular emphysema noted. No suspicious pulmonary nodule or mass. No focal airspace consolidation. No pleural effusion. Musculoskeletal: No worrisome lytic or sclerotic osseous abnormality. CT ABDOMEN PELVIS FINDINGS Hepatobiliary: No focal abnormality within the liver parenchyma. There is no evidence for gallstones, gallbladder wall thickening, or pericholecystic fluid. No intrahepatic or extrahepatic biliary dilation. Pancreas: No focal mass lesion. No dilatation of the main duct. No intraparenchymal cyst. No peripancreatic edema. Spleen: No splenomegaly. No focal mass lesion. Adrenals/Urinary Tract: No adrenal nodule or mass. Stable appearance bilateral renal cysts. No evidence for hydroureter. The urinary bladder appears normal for the degree of distention. Stomach/Bowel: Stomach is nondistended. No gastric wall thickening. No evidence of outlet obstruction. Duodenum is normally positioned as is the ligament of Treitz. No small bowel wall thickening. No small bowel dilatation. The terminal ileum is normal. The appendix is normal. No gross colonic mass. No colonic wall thickening. Vascular/Lymphatic: There is abdominal aortic atherosclerosis without aneurysm. There is no gastrohepatic or hepatoduodenal ligament lymphadenopathy. No intraperitoneal or retroperitoneal lymphadenopathy. No pelvic sidewall lymphadenopathy. Reproductive: The uterus has normal CT imaging appearance. There is no adnexal mass. Other: No intraperitoneal free fluid. Musculoskeletal: No worrisome lytic or sclerotic osseous abnormality. IMPRESSION: 1. Stable exam. No new or progressive interval findings. Specifically, no evidence for metastatic disease in the chest, abdomen, or pelvis. 2.  Emphysema. (ICD10-J43.9) 3.  Aortic Atherosclerois (ICD10-170.0) Electronically Signed   By: EMisty StanleyM.D.   On: 04/15/2018 11:50      Assessment and plan- Patient is a 83y.o. female  muscle invasive small cell carcinoma of the bladder s/p chemo/ RT. she is here to discuss results of her CT scan  1.  I personally reviewed CT chest abdomen and pelvis images independently.  She remains in remission 1 year since her prior treatment and there is no evidence of metastatic disease on  today's scan.  She will continue to get surveillance scans every 6 months at this time  2.  Patient was noted to have a creatinine of 1.3 prior to her CT scan.  I recommended that she should get 1 L of IV fluids.  At this time patient would like to improve her oral intake of fluids and we will repeat her BMP next week.  If her creatinine remains elevated we will give her some more IV fluids at that time  3.  Patient has had her port taken off and she is now off Eliquis.  She is now back on Plavix for her underlying cardiovascular disease.  4.  I have asked her to discuss her problem of gait imbalance with her primary care doctor.  If her problem worsens we will consider doing an MRI of her brain at that time  I will see her back in 6 months with CBC CMP and a repeat CT chest abdomen and pelvis prior   Visit Diagnosis 1. Small cell carcinoma of bladder ([place])      Dr. Randa Evens, MD, MPH [facility] [phone] 04/16/2018 3:43 PM

## 2018-04-16 NOTE — Telephone Encounter (Signed)
Called patient's home to give her the next week appt for 12/10 at 2:15 for labs and 2:30 for poss. Fluids. No answer at her home. No messaging system to leave a message. I will send her a my chart message. I will try calling her back tom. also

## 2018-04-17 NOTE — Telephone Encounter (Signed)
Unable to send a my chart message to patient.  Called her today and she answer the phone and she was given the appointment for December 10 at 215 for labs and 230 in case she needs fluids based on if her creatinine is better or not.  Patient agreeable to plan and will be there next week

## 2018-04-23 ENCOUNTER — Other Ambulatory Visit: Payer: Self-pay

## 2018-04-23 ENCOUNTER — Inpatient Hospital Stay: Payer: Medicare Other

## 2018-04-23 VITALS — BP 186/72 | HR 73 | Temp 96.0°F | Resp 18

## 2018-04-23 DIAGNOSIS — C679 Malignant neoplasm of bladder, unspecified: Secondary | ICD-10-CM

## 2018-04-23 DIAGNOSIS — R5383 Other fatigue: Secondary | ICD-10-CM | POA: Diagnosis not present

## 2018-04-23 DIAGNOSIS — I110 Hypertensive heart disease with heart failure: Secondary | ICD-10-CM | POA: Diagnosis not present

## 2018-04-23 DIAGNOSIS — I2510 Atherosclerotic heart disease of native coronary artery without angina pectoris: Secondary | ICD-10-CM | POA: Diagnosis not present

## 2018-04-23 DIAGNOSIS — D751 Secondary polycythemia: Secondary | ICD-10-CM | POA: Diagnosis not present

## 2018-04-23 DIAGNOSIS — I509 Heart failure, unspecified: Secondary | ICD-10-CM | POA: Diagnosis not present

## 2018-04-23 DIAGNOSIS — D72829 Elevated white blood cell count, unspecified: Secondary | ICD-10-CM

## 2018-04-23 LAB — BASIC METABOLIC PANEL
ANION GAP: 8 (ref 5–15)
BUN: 25 mg/dL — ABNORMAL HIGH (ref 8–23)
CHLORIDE: 100 mmol/L (ref 98–111)
CO2: 34 mmol/L — AB (ref 22–32)
Calcium: 10.7 mg/dL — ABNORMAL HIGH (ref 8.9–10.3)
Creatinine, Ser: 1.27 mg/dL — ABNORMAL HIGH (ref 0.44–1.00)
GFR calc Af Amer: 46 mL/min — ABNORMAL LOW (ref >60)
GFR calc non Af Amer: 40 mL/min — ABNORMAL LOW (ref >60)
GLUCOSE: 131 mg/dL — AB (ref 70–99)
Potassium: 3.6 mmol/L (ref 3.5–5.1)
Sodium: 142 mmol/L (ref 135–145)

## 2018-04-23 MED ORDER — SODIUM CHLORIDE 0.9 % IV SOLN
999.0000 mL/h | Freq: Once | INTRAVENOUS | Status: DC
  Filled 2018-04-23: qty 250

## 2018-04-23 MED ORDER — SODIUM CHLORIDE 0.9 % IV SOLN
999.0000 mL/h | Freq: Once | INTRAVENOUS | Status: AC
  Administered 2018-04-23: 999 mL/h via INTRAVENOUS
  Filled 2018-04-23: qty 250

## 2018-04-30 ENCOUNTER — Other Ambulatory Visit: Payer: Self-pay

## 2018-04-30 ENCOUNTER — Other Ambulatory Visit (INDEPENDENT_AMBULATORY_CARE_PROVIDER_SITE_OTHER): Payer: Medicare Other

## 2018-04-30 DIAGNOSIS — R739 Hyperglycemia, unspecified: Secondary | ICD-10-CM | POA: Diagnosis not present

## 2018-04-30 DIAGNOSIS — I10 Essential (primary) hypertension: Secondary | ICD-10-CM

## 2018-04-30 DIAGNOSIS — E7800 Pure hypercholesterolemia, unspecified: Secondary | ICD-10-CM

## 2018-04-30 DIAGNOSIS — N183 Chronic kidney disease, stage 3 unspecified: Secondary | ICD-10-CM

## 2018-04-30 LAB — LIPID PANEL
Cholesterol: 167 mg/dL (ref 0–200)
HDL: 63.3 mg/dL (ref >39.00)
LDL Cholesterol: 78 mg/dL (ref 0–99)
NonHDL: 103.29
Total CHOL/HDL Ratio: 3
Triglycerides: 125 mg/dL (ref 0.0–149.0)
VLDL: 25 mg/dL (ref 0.0–40.0)

## 2018-04-30 LAB — HEPATIC FUNCTION PANEL
ALT: 15 U/L (ref 0–35)
AST: 16 U/L (ref 0–37)
Albumin: 4.5 g/dL (ref 3.5–5.2)
Alkaline Phosphatase: 61 U/L (ref 39–117)
Bilirubin, Direct: 0.1 mg/dL (ref 0.0–0.3)
Total Bilirubin: 0.8 mg/dL (ref 0.2–1.2)
Total Protein: 7 g/dL (ref 6.0–8.3)

## 2018-04-30 LAB — BASIC METABOLIC PANEL
BUN: 25 mg/dL — ABNORMAL HIGH (ref 6–23)
CO2: 28 meq/L (ref 19–32)
Calcium: 9.7 mg/dL (ref 8.4–10.5)
Chloride: 103 mEq/L (ref 96–112)
Creatinine, Ser: 1.23 mg/dL — ABNORMAL HIGH (ref 0.40–1.20)
GFR: 44.49 mL/min — ABNORMAL LOW (ref >60.00)
Glucose, Bld: 119 mg/dL — ABNORMAL HIGH (ref 70–99)
Potassium: 3.5 mEq/L (ref 3.5–5.1)
Sodium: 141 mEq/L (ref 135–145)

## 2018-04-30 LAB — HEMOGLOBIN A1C: Hgb A1c MFr Bld: 5.6 % (ref 4.6–6.5)

## 2018-04-30 LAB — TSH: TSH: 5.78 u[IU]/mL — ABNORMAL HIGH (ref 0.35–4.50)

## 2018-04-30 MED ORDER — LOSARTAN POTASSIUM 100 MG PO TABS: ORAL_TABLET | ORAL | 1 refills | Status: DC

## 2018-05-01 DIAGNOSIS — Z955 Presence of coronary angioplasty implant and graft: Secondary | ICD-10-CM | POA: Diagnosis not present

## 2018-05-01 DIAGNOSIS — L821 Other seborrheic keratosis: Secondary | ICD-10-CM | POA: Diagnosis not present

## 2018-05-01 DIAGNOSIS — Z808 Family history of malignant neoplasm of other organs or systems: Secondary | ICD-10-CM | POA: Diagnosis not present

## 2018-05-01 DIAGNOSIS — D3617 Benign neoplasm of peripheral nerves and autonomic nervous system of trunk, unspecified: Secondary | ICD-10-CM | POA: Diagnosis not present

## 2018-05-01 DIAGNOSIS — E7800 Pure hypercholesterolemia, unspecified: Secondary | ICD-10-CM | POA: Diagnosis not present

## 2018-05-01 DIAGNOSIS — L820 Inflamed seborrheic keratosis: Secondary | ICD-10-CM | POA: Diagnosis not present

## 2018-05-01 DIAGNOSIS — D485 Neoplasm of uncertain behavior of skin: Secondary | ICD-10-CM | POA: Diagnosis not present

## 2018-05-01 DIAGNOSIS — I2510 Atherosclerotic heart disease of native coronary artery without angina pectoris: Secondary | ICD-10-CM | POA: Diagnosis not present

## 2018-05-01 DIAGNOSIS — L578 Other skin changes due to chronic exposure to nonionizing radiation: Secondary | ICD-10-CM | POA: Diagnosis not present

## 2018-05-01 DIAGNOSIS — R0602 Shortness of breath: Secondary | ICD-10-CM | POA: Diagnosis not present

## 2018-05-01 DIAGNOSIS — N183 Chronic kidney disease, stage 3 (moderate): Secondary | ICD-10-CM | POA: Diagnosis not present

## 2018-05-01 DIAGNOSIS — D225 Melanocytic nevi of trunk: Secondary | ICD-10-CM | POA: Diagnosis not present

## 2018-05-01 DIAGNOSIS — R002 Palpitations: Secondary | ICD-10-CM | POA: Diagnosis not present

## 2018-05-01 DIAGNOSIS — I5031 Acute diastolic (congestive) heart failure: Secondary | ICD-10-CM | POA: Diagnosis not present

## 2018-05-01 DIAGNOSIS — I10 Essential (primary) hypertension: Secondary | ICD-10-CM | POA: Diagnosis not present

## 2018-05-01 DIAGNOSIS — J449 Chronic obstructive pulmonary disease, unspecified: Secondary | ICD-10-CM | POA: Diagnosis not present

## 2018-05-02 ENCOUNTER — Ambulatory Visit (INDEPENDENT_AMBULATORY_CARE_PROVIDER_SITE_OTHER): Payer: Medicare Other | Admitting: Internal Medicine

## 2018-05-02 ENCOUNTER — Encounter: Payer: Self-pay | Admitting: Internal Medicine

## 2018-05-02 VITALS — BP 180/88 | HR 75 | Temp 98.1°F | Resp 18 | Ht 65.0 in | Wt 137.2 lb

## 2018-05-02 DIAGNOSIS — I10 Essential (primary) hypertension: Secondary | ICD-10-CM

## 2018-05-02 DIAGNOSIS — E7800 Pure hypercholesterolemia, unspecified: Secondary | ICD-10-CM | POA: Diagnosis not present

## 2018-05-02 DIAGNOSIS — I2510 Atherosclerotic heart disease of native coronary artery without angina pectoris: Secondary | ICD-10-CM | POA: Diagnosis not present

## 2018-05-02 DIAGNOSIS — D45 Polycythemia vera: Secondary | ICD-10-CM | POA: Diagnosis not present

## 2018-05-02 DIAGNOSIS — J410 Simple chronic bronchitis: Secondary | ICD-10-CM

## 2018-05-02 DIAGNOSIS — R2681 Unsteadiness on feet: Secondary | ICD-10-CM | POA: Diagnosis not present

## 2018-05-02 DIAGNOSIS — N183 Chronic kidney disease, stage 3 unspecified: Secondary | ICD-10-CM

## 2018-05-02 DIAGNOSIS — R739 Hyperglycemia, unspecified: Secondary | ICD-10-CM | POA: Diagnosis not present

## 2018-05-02 MED ORDER — HYDRALAZINE HCL 10 MG PO TABS: 10.0000 mg | ORAL_TABLET | Freq: Two times a day (BID) | ORAL | 2 refills | Status: DC

## 2018-05-02 NOTE — Progress Notes (Signed)
[name], female   DOB: 30-May-1934, 83 y.o.   MRN: [id number]   Subjective:    [name], female    DOB: [birth year], 83 y.o.   MRN: [id number]  HPI  Patient here for a scheduled physical, but she declines physical today. Saw Dr Raul Del yesterday for f/u  Stage II copd.  Recommended continue same regimen (symbicort and duo neb).  Also saw Dr Saralyn Pilar yesterday.  Stable.  Recommended f/u in 4 months.  She reports she still gets sob with increased activity.  Improved from initial discharge, but has persistent sob.  No chest pain. No acid reflux.  No abdominal pain.  Bowels moving.  Does report noticing some unsteadiness.  Gets a little off balance at times.  Discussed therapy.  She wants to hold at this time.     Past Medical History:  Diagnosis Date  . Anemia   . Asthma   . Bladder cancer ([place])   . BRCA positive   . CAD (coronary artery disease)   . CHF (congestive heart failure) ([place])   . COPD (chronic obstructive pulmonary disease) ([place])   . Emphysema lung ([place])   . GERD (gastroesophageal reflux disease)   . History of colon polyps   . Hypercholesterolemia   . Hyperglycemia   . Hypertension   . Lung nodules   . Osteoporosis   . Personal history of tobacco use, presenting hazards to health 11/25/2014  . Polycythemia vera(238.4)   . Renal cyst   . Skin cancer    Past Surgical History:  Procedure Laterality Date  . ANGIOPLASTY     coronary (x1)  . COLONOSCOPY WITH PROPOFOL N/A 03/02/2015   Procedure: COLONOSCOPY WITH PROPOFOL;  Surgeon: Lollie Sails, MD;  Location: [facility] ENDOSCOPY;  Service: Endoscopy;  Laterality: N/A;  . COLONOSCOPY WITH PROPOFOL N/A 02/13/2017   Procedure: COLONOSCOPY WITH PROPOFOL;  Surgeon: [name], MD;  Location: [facility] ENDOSCOPY;  Service: Endoscopy;  Laterality: N/A;  . CORONARY ANGIOPLASTY    . CORONARY ARTERY BYPASS GRAFT  09/17/2017   pt denies  . CYSTOSCOPY W/ RETROGRADES Bilateral 02/13/2017   Procedure:  CYSTOSCOPY WITH RETROGRADE PYELOGRAM;  Surgeon: Hollice Espy, MD;  Location: [facility] ORS;  Service: Urology;  Laterality: Bilateral;  . CYSTOSCOPY W/ RETROGRADES Bilateral 09/19/2017   Procedure: CYSTOSCOPY WITH RETROGRADE PYELOGRAM;  Surgeon: Hollice Espy, MD;  Location: [facility] ORS;  Service: Urology;  Laterality: Bilateral;  . CYSTOSCOPY WITH BIOPSY  03/26/2017   Procedure: CYSTOSCOPY WITH BIOPSY;  Surgeon: Hollice Espy, MD;  Location: [facility] ORS;  Service: Urology;;  . Consuela Mimes WITH STENT PLACEMENT Bilateral 02/13/2017   Procedure: CYSTOSCOPY WITH STENT PLACEMENT;  Surgeon: Hollice Espy, MD;  Location: [facility] ORS;  Service: Urology;  Laterality: Bilateral;  . CYSTOSCOPY WITH STENT PLACEMENT Bilateral 03/26/2017   Procedure: CYSTOSCOPY WITH STENT EXCHANGE;  Surgeon: Hollice Espy, MD;  Location: [facility] ORS;  Service: Urology;  Laterality: Bilateral;  . ESOPHAGOGASTRODUODENOSCOPY (EGD) WITH PROPOFOL N/A 02/13/2017   Procedure: ESOPHAGOGASTRODUODENOSCOPY (EGD) WITH PROPOFOL;  Surgeon: [name], MD;  Location: [facility] ENDOSCOPY;  Service: Endoscopy;  Laterality: N/A;  . PORTA CATH INSERTION N/A 02/28/2017   Procedure: PORTA CATH INSERTION;  Surgeon: Algernon Huxley, MD;  Location: [place] CV LAB;  Service: Cardiovascular;  Laterality: N/A;  . PORTA CATH REMOVAL N/A 12/24/2017   Procedure: PORTA CATH REMOVAL;  Surgeon: Algernon Huxley, MD;  Location: [place] CV LAB;  Service: Cardiovascular;  Laterality: N/A;  . SALPINGOOPHORECTOMY    .  TONSILECTOMY/ADENOIDECTOMY WITH MYRINGOTOMY    . TONSILLECTOMY    . TRANSURETHRAL RESECTION OF BLADDER TUMOR N/A 02/13/2017   Procedure: TRANSURETHRAL RESECTION OF BLADDER TUMOR (TURBT);  Surgeon: Hollice Espy, MD;  Location: [facility] ORS;  Service: Urology;  Laterality: N/A;  . TUBAL LIGATION    . UPPER GI ENDOSCOPY  02/13/2017  . URETEROSCOPY Left 09/19/2017   Procedure: URETEROSCOPY;  Surgeon: Hollice Espy, MD;  Location: [facility] ORS;  Service: Urology;   Laterality: Left;  Marland Kitchen VISCERAL ARTERY INTERVENTION N/A 02/12/2017   Procedure: VISCERAL ARTERY INTERVENTION;  Surgeon: Algernon Huxley, MD;  Location: [place] CV LAB;  Service: Cardiovascular;  Laterality: N/A;   Family History  Problem Relation Age of Onset  . Cirrhosis Father        died age 41  . Alcohol abuse Father   . Asthma Mother   . Congestive Heart Failure Mother   . Breast cancer Mother        2 (1/2 sisters)  . Osteoarthritis Mother   . Colon cancer Mother   . Lupus Sister   . Alcohol abuse Sister   . Ovarian cancer Sister   . Osteoporosis Sister   . Skin cancer Sister   . Breast cancer Cousin   . Breast cancer Maternal Aunt    Social History   Socioeconomic History  . Marital status: Widowed    Spouse name: Not on file  . Number of children: 2  . Years of education: Not on file  . Highest education level: Not on file  Occupational History  . Not on file  Social Needs  . Financial resource strain: Not hard at all  . Food insecurity:    Worry: Never true    Inability: Never true  . Transportation needs:    Medical: No    Non-medical: No  Tobacco Use  . Smoking status: Former Smoker    Packs/day: 1.00    Years: 45.00    Pack years: 45.00    Types: Cigarettes    Last attempt to quit: 11/11/2003    Years since quitting: 14.5  . Smokeless tobacco: Never Used  Substance and Sexual Activity  . Alcohol use: No    Alcohol/week: 0.0 standard drinks    Comment: occasional  . Drug use: No  . Sexual activity: Never  Lifestyle  . Physical activity:    Days per week: 2 days    Minutes per session: 90 min  . Stress: Not at all  Relationships  . Social connections:    Talks on phone: Not on file    Gets together: Not on file    Attends religious service: Not on file    Active member of club or organization: Not on file    Attends meetings of clubs or organizations: Not on file    Relationship status: Not on file  Other Topics Concern  . Not on file    Social History Narrative  . Not on file    Outpatient Encounter Medications as of 05/02/2018  Medication Sig  . albuterol (PROVENTIL HFA;VENTOLIN HFA) 108 (90 Base) MCG/ACT inhaler Inhale 2 puffs into the lungs every 6 (six) hours as needed for wheezing or shortness of breath.  Marland Kitchen amLODipine (NORVASC) 5 MG tablet Take 1 tablet (5 mg total) by mouth 2 (two) times daily.  . cholecalciferol (VITAMIN D) 1000 units tablet Take 1,000 Units by mouth daily.  . clopidogrel (PLAVIX) 75 MG tablet Take 1 tablet (75 mg total) by mouth daily.  Marland Kitchen  fluticasone (FLONASE) 50 MCG/ACT nasal spray Place 2 sprays into both nostrils daily as needed for allergies.  . hydrALAZINE (APRESOLINE) 10 MG tablet Take 1 tablet (10 mg total) by mouth 2 (two) times daily.  Marland Kitchen ipratropium-albuterol (DUONEB) 0.5-2.5 (3) MG/3ML SOLN Take 3 mLs by nebulization every 6 (six) hours as needed (for shortness of breath).  . losartan (COZAAR) 100 MG tablet TAKE 119m BY MOUTH  DAILY  . lovastatin (MEVACOR) 40 MG tablet TAKE 1 TABLET BY MOUTH  DAILY (evening)  . metoprolol tartrate (LOPRESSOR) 25 MG tablet TAKE 1 TABLET BY MOUTH  TWICE A DAY  . OVER THE COUNTER MEDICATION daily.  . potassium chloride (K-DUR,KLOR-CON) 10 MEQ tablet Take 1 tablet (10 mEq total) by mouth daily.  . SYMBICORT 160-4.5 MCG/ACT inhaler USE 2 PUFFS TWO TIMES DAILY  . torsemide (DEMADEX) 20 MG tablet Take 20 mg by mouth every morning.   Facility-Administered Encounter Medications as of 05/02/2018  Medication  . heparin lock flush 100 unit/mL  . sodium chloride flush (NS) 0.9 % injection 10 mL    Review of Systems  Constitutional: Negative for appetite change and unexpected weight change.  HENT: Negative for congestion and sinus pressure.   Respiratory: Negative for cough and chest tightness.        Still with sob with exertion.    Cardiovascular: Negative for chest pain, palpitations and leg swelling.  Gastrointestinal: Negative for abdominal pain,  diarrhea, nausea and vomiting.  Genitourinary: Negative for difficulty urinating and dysuria.  Musculoskeletal: Negative for joint swelling and myalgias.  Skin: Negative for color change and rash.  Neurological: Negative for dizziness, light-headedness and headaches.  Psychiatric/Behavioral: Negative for agitation and dysphoric mood.       Objective:    Physical Exam Constitutional:      General: She is not in acute distress.    Appearance: Normal appearance.  HENT:     Nose: Nose normal. No congestion.     Mouth/Throat:     Pharynx: No oropharyngeal exudate or posterior oropharyngeal erythema.  Neck:     Musculoskeletal: Neck supple. No muscular tenderness.     Thyroid: No thyromegaly.  Cardiovascular:     Rate and Rhythm: Normal rate and regular rhythm.  Pulmonary:     Effort: No respiratory distress.     Breath sounds: Normal breath sounds. No wheezing.  Abdominal:     General: Bowel sounds are normal.     Palpations: Abdomen is soft.     Tenderness: There is no abdominal tenderness.  Musculoskeletal:        General: No swelling or tenderness.  Lymphadenopathy:     Cervical: No cervical adenopathy.  Skin:    Findings: No erythema or rash.  Neurological:     Mental Status: She is alert.  Psychiatric:        Mood and Affect: Mood normal.        Behavior: Behavior normal.     BP (!) 180/88 (BP Location: Left Arm, Patient Position: Sitting, Cuff Size: Normal)   Pulse 75   Temp 98.1 F (36.7 C) (Oral)   Resp 18   Ht _0  (1.651 m)   Wt 137 lb 3.2 oz (62.2 kg)   SpO2 96%   BMI 22.83 kg/m  Wt Readings from Last 3 Encounters:  05/02/18 137 lb 3.2 oz (62.2 kg)  04/16/18 137 lb 4.8 oz (62.3 kg)  03/22/18 139 lb 4 oz (63.2 kg)     Lab Results  Component Value Date  WBC 11.2 (H) 04/15/2018   HGB 15.4 (H) 04/15/2018   HCT 44.9 04/15/2018   PLT 175 04/15/2018   GLUCOSE 129 (H) 05/07/2018   CHOL 167 04/30/2018   TRIG 125.0 04/30/2018   HDL 63.30 04/30/2018    LDLCALC 78 04/30/2018   ALT 15 04/30/2018   AST 16 04/30/2018   NA 141 05/07/2018   K 3.7 05/07/2018   CL 103 05/07/2018   CREATININE 1.11 (H) 05/07/2018   BUN 23 05/07/2018   CO2 30 05/07/2018   TSH 5.78 (H) 04/30/2018   HGBA1C 5.6 04/30/2018    Ct Chest W Contrast  Result Date: 04/15/2018 CLINICAL DATA:  Invasive small cell bladder cancer. EXAM: CT CHEST, ABDOMEN, AND PELVIS WITH CONTRAST TECHNIQUE: Multidetector CT imaging of the chest, abdomen and pelvis was performed following the standard protocol during bolus administration of intravenous contrast. CONTRAST:  56m OMNIPAQUE IOHEXOL 300 MG/ML  SOLN COMPARISON:  12/10/2017. FINDINGS: CT CHEST FINDINGS Cardiovascular: Heart size upper normal. Coronary artery calcification is evident. Atherosclerotic calcification is noted in the wall of the thoracic aorta. Mediastinum/Nodes: No mediastinal lymphadenopathy. There is no hilar lymphadenopathy. The esophagus has normal imaging features. There is no axillary lymphadenopathy. Lungs/Pleura: The central tracheobronchial airways are patent. Centrilobular emphysema noted. No suspicious pulmonary nodule or mass. No focal airspace consolidation. No pleural effusion. Musculoskeletal: No worrisome lytic or sclerotic osseous abnormality. CT ABDOMEN PELVIS FINDINGS Hepatobiliary: No focal abnormality within the liver parenchyma. There is no evidence for gallstones, gallbladder wall thickening, or pericholecystic fluid. No intrahepatic or extrahepatic biliary dilation. Pancreas: No focal mass lesion. No dilatation of the main duct. No intraparenchymal cyst. No peripancreatic edema. Spleen: No splenomegaly. No focal mass lesion. Adrenals/Urinary Tract: No adrenal nodule or mass. Stable appearance bilateral renal cysts. No evidence for hydroureter. The urinary bladder appears normal for the degree of distention. Stomach/Bowel: Stomach is nondistended. No gastric wall thickening. No evidence of outlet obstruction.  Duodenum is normally positioned as is the ligament of Treitz. No small bowel wall thickening. No small bowel dilatation. The terminal ileum is normal. The appendix is normal. No gross colonic mass. No colonic wall thickening. Vascular/Lymphatic: There is abdominal aortic atherosclerosis without aneurysm. There is no gastrohepatic or hepatoduodenal ligament lymphadenopathy. No intraperitoneal or retroperitoneal lymphadenopathy. No pelvic sidewall lymphadenopathy. Reproductive: The uterus has normal CT imaging appearance. There is no adnexal mass. Other: No intraperitoneal free fluid. Musculoskeletal: No worrisome lytic or sclerotic osseous abnormality. IMPRESSION: 1. Stable exam. No new or progressive interval findings. Specifically, no evidence for metastatic disease in the chest, abdomen, or pelvis. 2.  Emphysema. (ICD10-J43.9) 3.  Aortic Atherosclerois (ICD10-170.0) Electronically Signed   By: EMisty StanleyM.D.   On: 04/15/2018 11:50   Ct Abdomen Pelvis W Contrast  Result Date: 04/15/2018 CLINICAL DATA:  Invasive small cell bladder cancer. EXAM: CT CHEST, ABDOMEN, AND PELVIS WITH CONTRAST TECHNIQUE: Multidetector CT imaging of the chest, abdomen and pelvis was performed following the standard protocol during bolus administration of intravenous contrast. CONTRAST:  73mOMNIPAQUE IOHEXOL 300 MG/ML  SOLN COMPARISON:  12/10/2017. FINDINGS: CT CHEST FINDINGS Cardiovascular: Heart size upper normal. Coronary artery calcification is evident. Atherosclerotic calcification is noted in the wall of the thoracic aorta. Mediastinum/Nodes: No mediastinal lymphadenopathy. There is no hilar lymphadenopathy. The esophagus has normal imaging features. There is no axillary lymphadenopathy. Lungs/Pleura: The central tracheobronchial airways are patent. Centrilobular emphysema noted. No suspicious pulmonary nodule or mass. No focal airspace consolidation. No pleural effusion. Musculoskeletal: No worrisome lytic or sclerotic  osseous abnormality.  CT ABDOMEN PELVIS FINDINGS Hepatobiliary: No focal abnormality within the liver parenchyma. There is no evidence for gallstones, gallbladder wall thickening, or pericholecystic fluid. No intrahepatic or extrahepatic biliary dilation. Pancreas: No focal mass lesion. No dilatation of the main duct. No intraparenchymal cyst. No peripancreatic edema. Spleen: No splenomegaly. No focal mass lesion. Adrenals/Urinary Tract: No adrenal nodule or mass. Stable appearance bilateral renal cysts. No evidence for hydroureter. The urinary bladder appears normal for the degree of distention. Stomach/Bowel: Stomach is nondistended. No gastric wall thickening. No evidence of outlet obstruction. Duodenum is normally positioned as is the ligament of Treitz. No small bowel wall thickening. No small bowel dilatation. The terminal ileum is normal. The appendix is normal. No gross colonic mass. No colonic wall thickening. Vascular/Lymphatic: There is abdominal aortic atherosclerosis without aneurysm. There is no gastrohepatic or hepatoduodenal ligament lymphadenopathy. No intraperitoneal or retroperitoneal lymphadenopathy. No pelvic sidewall lymphadenopathy. Reproductive: The uterus has normal CT imaging appearance. There is no adnexal mass. Other: No intraperitoneal free fluid. Musculoskeletal: No worrisome lytic or sclerotic osseous abnormality. IMPRESSION: 1. Stable exam. No new or progressive interval findings. Specifically, no evidence for metastatic disease in the chest, abdomen, or pelvis. 2.  Emphysema. (ICD10-J43.9) 3.  Aortic Atherosclerois (ICD10-170.0) Electronically Signed   By: Misty Stanley M.D.   On: 04/15/2018 11:50       Assessment & Plan:   Problem List Items Addressed This Visit    CAD (coronary artery disease)    Followed by cardiology.  Just reevaluated.  Felt things were stable.  Continue risk factor modification.        Relevant Medications   hydrALAZINE (APRESOLINE) 10 MG tablet    CKD (chronic kidney disease), stage III ([place])    Followed by nephrology.  Last evaluated 01/2018.  No changes made.  Recommended f/u in 05/2018.  Stable.  Follow metabolic panel.        COPD (chronic obstructive pulmonary disease) ([place])     Just saw Dr Raul Del.  No changes made.        Essential hypertension    Blood pressure on recheck improved, but still elevated.  States her outside blood pressure checks have averaged 140-155/70-80.  Start hydralazine 3m bid.  Follow pressures.  Follow metabolic panel.        Relevant Medications   hydrALAZINE (APRESOLINE) 10 MG tablet   Hypercholesterolemia    On lovastatin.  Low cholesterol diet and exercise.  Follow lipid panel and liver function tests.        Relevant Medications   hydrALAZINE (APRESOLINE) 10 MG tablet   Hyperglycemia    Low carb diet and exercise.  Follow met b and a1c.        [place]a [place]    Followed by hematology.        Unsteadiness    Unsteadiness as outlined.  Discussed therapy.  She declines.  Check B12.         Other Visit Diagnoses    Unsteady gait    -  Primary   Relevant Orders   Vitamin B12       CEinar Pheasant MD

## 2018-05-07 ENCOUNTER — Inpatient Hospital Stay: Payer: Medicare Other

## 2018-05-07 ENCOUNTER — Other Ambulatory Visit: Payer: Self-pay

## 2018-05-07 DIAGNOSIS — R5383 Other fatigue: Secondary | ICD-10-CM | POA: Diagnosis not present

## 2018-05-07 DIAGNOSIS — I110 Hypertensive heart disease with heart failure: Secondary | ICD-10-CM | POA: Diagnosis not present

## 2018-05-07 DIAGNOSIS — I509 Heart failure, unspecified: Secondary | ICD-10-CM | POA: Diagnosis not present

## 2018-05-07 DIAGNOSIS — D751 Secondary polycythemia: Secondary | ICD-10-CM | POA: Diagnosis not present

## 2018-05-07 DIAGNOSIS — I2510 Atherosclerotic heart disease of native coronary artery without angina pectoris: Secondary | ICD-10-CM | POA: Diagnosis not present

## 2018-05-07 DIAGNOSIS — C679 Malignant neoplasm of bladder, unspecified: Secondary | ICD-10-CM

## 2018-05-07 LAB — BASIC METABOLIC PANEL
Anion gap: 8 (ref 5–15)
BUN: 23 mg/dL (ref 8–23)
CO2: 30 mmol/L (ref 22–32)
Calcium: 9.9 mg/dL (ref 8.9–10.3)
Chloride: 103 mmol/L (ref 98–111)
Creatinine, Ser: 1.11 mg/dL — ABNORMAL HIGH (ref 0.44–1.00)
GFR calc Af Amer: 54 mL/min — ABNORMAL LOW (ref >60)
GFR, EST NON AFRICAN AMERICAN: 47 mL/min — AB (ref >60)
Glucose, Bld: 129 mg/dL — ABNORMAL HIGH (ref 70–99)
Potassium: 3.7 mmol/L (ref 3.5–5.1)
Sodium: 141 mmol/L (ref 135–145)

## 2018-05-07 LAB — VITAMIN B12: Vitamin B-12: 203 pg/mL (ref 180–914)

## 2018-05-07 NOTE — Progress Notes (Signed)
No fluids today per Dr. Janese Banks.  She is aware of today's lab results.

## 2018-05-09 ENCOUNTER — Telehealth: Payer: Self-pay | Admitting: *Deleted

## 2018-05-09 NOTE — Telephone Encounter (Signed)
Called patient and let her know that her B12 levels is a little low at 208.  Dr. Janese Banks suggest that patient take B12 1000 mcg daily.  Patient is going to be running to the pharmacy this afternoon and will pick it up then and start it. syv

## 2018-05-09 NOTE — Telephone Encounter (Signed)
-----   Message from Sindy Guadeloupe, MD sent at 05/09/2018  8:03 AM EST ----- She needs to take po b12 1000 mcg daily. Thanks, Astrid Divine

## 2018-05-11 DIAGNOSIS — R2681 Unsteadiness on feet: Secondary | ICD-10-CM | POA: Insufficient documentation

## 2018-05-11 NOTE — Assessment & Plan Note (Signed)
Just saw Dr Raul Del.  No changes made.

## 2018-05-11 NOTE — Assessment & Plan Note (Signed)
Unsteadiness as outlined.  Discussed therapy.  She declines.  Check B12.

## 2018-05-11 NOTE — Assessment & Plan Note (Signed)
On lovastatin.  Low cholesterol diet and exercise.  Follow lipid panel and liver function tests.   

## 2018-05-11 NOTE — Assessment & Plan Note (Signed)
Followed by cardiology.  Just reevaluated.  Felt things were stable.  Continue risk factor modification.

## 2018-05-11 NOTE — Assessment & Plan Note (Signed)
Followed by nephrology.  Last evaluated 01/2018.  No changes made.  Recommended f/u in 05/2018.  Stable.  Follow metabolic panel.

## 2018-05-11 NOTE — Assessment & Plan Note (Signed)
Followed by hematology 

## 2018-05-11 NOTE — Assessment & Plan Note (Signed)
Low carb diet and exercise.  Follow met b and a1c.   

## 2018-05-11 NOTE — Assessment & Plan Note (Addendum)
Blood pressure on recheck improved, but still elevated.  States her outside blood pressure checks have averaged 140-155/70-80.  Start hydralazine 10mg  bid.  Follow pressures.  Follow metabolic panel.

## 2018-05-29 DIAGNOSIS — I129 Hypertensive chronic kidney disease with stage 1 through stage 4 chronic kidney disease, or unspecified chronic kidney disease: Secondary | ICD-10-CM | POA: Diagnosis not present

## 2018-05-29 DIAGNOSIS — R600 Localized edema: Secondary | ICD-10-CM | POA: Diagnosis not present

## 2018-05-29 DIAGNOSIS — N183 Chronic kidney disease, stage 3 (moderate): Secondary | ICD-10-CM | POA: Diagnosis not present

## 2018-06-18 ENCOUNTER — Other Ambulatory Visit: Payer: Self-pay | Admitting: Internal Medicine

## 2018-06-20 ENCOUNTER — Encounter: Payer: Self-pay | Admitting: Internal Medicine

## 2018-06-20 ENCOUNTER — Ambulatory Visit (INDEPENDENT_AMBULATORY_CARE_PROVIDER_SITE_OTHER): Payer: Medicare Other | Admitting: Internal Medicine

## 2018-06-20 DIAGNOSIS — R490 Dysphonia: Secondary | ICD-10-CM

## 2018-06-20 DIAGNOSIS — R739 Hyperglycemia, unspecified: Secondary | ICD-10-CM

## 2018-06-20 DIAGNOSIS — E7800 Pure hypercholesterolemia, unspecified: Secondary | ICD-10-CM | POA: Diagnosis not present

## 2018-06-20 DIAGNOSIS — J410 Simple chronic bronchitis: Secondary | ICD-10-CM | POA: Diagnosis not present

## 2018-06-20 DIAGNOSIS — N183 Chronic kidney disease, stage 3 unspecified: Secondary | ICD-10-CM

## 2018-06-20 DIAGNOSIS — I2510 Atherosclerotic heart disease of native coronary artery without angina pectoris: Secondary | ICD-10-CM | POA: Diagnosis not present

## 2018-06-20 DIAGNOSIS — D45 Polycythemia vera: Secondary | ICD-10-CM | POA: Diagnosis not present

## 2018-06-20 DIAGNOSIS — C679 Malignant neoplasm of bladder, unspecified: Secondary | ICD-10-CM | POA: Diagnosis not present

## 2018-06-20 DIAGNOSIS — I10 Essential (primary) hypertension: Secondary | ICD-10-CM | POA: Diagnosis not present

## 2018-06-20 DIAGNOSIS — I5032 Chronic diastolic (congestive) heart failure: Secondary | ICD-10-CM

## 2018-06-20 MED ORDER — HYDRALAZINE HCL 25 MG PO TABS: 25.0000 mg | ORAL_TABLET | Freq: Three times a day (TID) | ORAL | 2 refills | Status: DC

## 2018-06-20 NOTE — Progress Notes (Signed)
[name], female   DOB: 28-Sep-1934, 84 y.o.   MRN: [id number]   Subjective:    [name], female    DOB: Mar 30, 1935, 84 y.o.   MRN: [id number]  HPI  Patient here for a scheduled follow up.  She reports she is doing relatively well.  Still line dancing.  No chest pain.  Feels breathing is stable.  No cough.  Some hoarseness - intermittently.  Using nebs prn.  No acid reflux.  No abdominal pain.  Bowels moving.  Handling stress.  Seeing nephrology.  Stable.     Past Medical History:  Diagnosis Date  . Anemia   . Asthma   . Bladder cancer ([place])   . BRCA positive   . CAD (coronary artery disease)   . CHF (congestive heart failure) ([place])   . COPD (chronic obstructive pulmonary disease) ([place])   . Emphysema lung ([place])   . GERD (gastroesophageal reflux disease)   . History of colon polyps   . Hypercholesterolemia   . Hyperglycemia   . Hypertension   . Lung nodules   . Osteoporosis   . Personal history of tobacco use, presenting hazards to health 11/25/2014  . Polycythemia vera(238.4)   . Renal cyst   . Skin cancer    Past Surgical History:  Procedure Laterality Date  . ANGIOPLASTY     coronary (x1)  . COLONOSCOPY WITH PROPOFOL N/A 03/02/2015   Procedure: COLONOSCOPY WITH PROPOFOL;  Surgeon: Lollie Sails, MD;  Location: [facility] ENDOSCOPY;  Service: Endoscopy;  Laterality: N/A;  . COLONOSCOPY WITH PROPOFOL N/A 02/13/2017   Procedure: COLONOSCOPY WITH PROPOFOL;  Surgeon: [name], MD;  Location: [facility] ENDOSCOPY;  Service: Endoscopy;  Laterality: N/A;  . CORONARY ANGIOPLASTY    . CORONARY ARTERY BYPASS GRAFT  09/17/2017   pt denies  . CYSTOSCOPY W/ RETROGRADES Bilateral 02/13/2017   Procedure: CYSTOSCOPY WITH RETROGRADE PYELOGRAM;  Surgeon: Hollice Espy, MD;  Location: [facility] ORS;  Service: Urology;  Laterality: Bilateral;  . CYSTOSCOPY W/ RETROGRADES Bilateral 09/19/2017   Procedure: CYSTOSCOPY WITH RETROGRADE PYELOGRAM;  Surgeon: Hollice Espy,  MD;  Location: [facility] ORS;  Service: Urology;  Laterality: Bilateral;  . CYSTOSCOPY WITH BIOPSY  03/26/2017   Procedure: CYSTOSCOPY WITH BIOPSY;  Surgeon: Hollice Espy, MD;  Location: [facility] ORS;  Service: Urology;;  . Consuela Mimes WITH STENT PLACEMENT Bilateral 02/13/2017   Procedure: CYSTOSCOPY WITH STENT PLACEMENT;  Surgeon: Hollice Espy, MD;  Location: [facility] ORS;  Service: Urology;  Laterality: Bilateral;  . CYSTOSCOPY WITH STENT PLACEMENT Bilateral 03/26/2017   Procedure: CYSTOSCOPY WITH STENT EXCHANGE;  Surgeon: Hollice Espy, MD;  Location: [facility] ORS;  Service: Urology;  Laterality: Bilateral;  . ESOPHAGOGASTRODUODENOSCOPY (EGD) WITH PROPOFOL N/A 02/13/2017   Procedure: ESOPHAGOGASTRODUODENOSCOPY (EGD) WITH PROPOFOL;  Surgeon: [name], MD;  Location: [facility] ENDOSCOPY;  Service: Endoscopy;  Laterality: N/A;  . PORTA CATH INSERTION N/A 02/28/2017   Procedure: PORTA CATH INSERTION;  Surgeon: Algernon Huxley, MD;  Location: [place] CV LAB;  Service: Cardiovascular;  Laterality: N/A;  . PORTA CATH REMOVAL N/A 12/24/2017   Procedure: PORTA CATH REMOVAL;  Surgeon: Algernon Huxley, MD;  Location: [place] CV LAB;  Service: Cardiovascular;  Laterality: N/A;  . SALPINGOOPHORECTOMY    . TONSILECTOMY/ADENOIDECTOMY WITH MYRINGOTOMY    . TONSILLECTOMY    . TRANSURETHRAL RESECTION OF BLADDER TUMOR N/A 02/13/2017   Procedure: TRANSURETHRAL RESECTION OF BLADDER TUMOR (TURBT);  Surgeon: Hollice Espy, MD;  Location: [facility] ORS;  Service: Urology;  Laterality:  N/A;  . TUBAL LIGATION    . UPPER GI ENDOSCOPY  02/13/2017  . URETEROSCOPY Left 09/19/2017   Procedure: URETEROSCOPY;  Surgeon: Hollice Espy, MD;  Location: [facility] ORS;  Service: Urology;  Laterality: Left;  Marland Kitchen VISCERAL ARTERY INTERVENTION N/A 02/12/2017   Procedure: VISCERAL ARTERY INTERVENTION;  Surgeon: Algernon Huxley, MD;  Location: [place] CV LAB;  Service: Cardiovascular;  Laterality: N/A;   Family History  Problem Relation Age of  Onset  . Cirrhosis Father        died age 43  . Alcohol abuse Father   . Asthma Mother   . Congestive Heart Failure Mother   . Breast cancer Mother        2 (1/2 sisters)  . Osteoarthritis Mother   . Colon cancer Mother   . Lupus Sister   . Alcohol abuse Sister   . Ovarian cancer Sister   . Osteoporosis Sister   . Skin cancer Sister   . Breast cancer Cousin   . Breast cancer Maternal Aunt    Social History   Socioeconomic History  . Marital status: Widowed    Spouse name: Not on file  . Number of children: 2  . Years of education: Not on file  . Highest education level: Not on file  Occupational History  . Not on file  Social Needs  . Financial resource strain: Not hard at all  . Food insecurity:    Worry: Never true    Inability: Never true  . Transportation needs:    Medical: No    Non-medical: No  Tobacco Use  . Smoking status: Former Smoker    Packs/day: 1.00    Years: 45.00    Pack years: 45.00    Types: Cigarettes    Last attempt to quit: 11/11/2003    Years since quitting: 14.6  . Smokeless tobacco: Never Used  Substance and Sexual Activity  . Alcohol use: No    Alcohol/week: 0.0 standard drinks    Comment: occasional  . Drug use: No  . Sexual activity: Never  Lifestyle  . Physical activity:    Days per week: 2 days    Minutes per session: 90 min  . Stress: Not at all  Relationships  . Social connections:    Talks on phone: Not on file    Gets together: Not on file    Attends religious service: Not on file    Active member of club or organization: Not on file    Attends meetings of clubs or organizations: Not on file    Relationship status: Not on file  Other Topics Concern  . Not on file  Social History Narrative  . Not on file    Outpatient Encounter Medications as of 06/20/2018  Medication Sig  . albuterol (PROVENTIL HFA;VENTOLIN HFA) 108 (90 Base) MCG/ACT inhaler Inhale 2 puffs into the lungs every 6 (six) hours as needed for wheezing or  shortness of breath.  Marland Kitchen amLODipine (NORVASC) 5 MG tablet Take 1 tablet (5 mg total) by mouth 2 (two) times daily.  . cholecalciferol (VITAMIN D) 1000 units tablet Take 1,000 Units by mouth daily.  . clopidogrel (PLAVIX) 75 MG tablet Take 1 tablet (75 mg total) by mouth daily.  . fluticasone (FLONASE) 50 MCG/ACT nasal spray Place 2 sprays into both nostrils daily as needed for allergies.  . hydrALAZINE (APRESOLINE) 25 MG tablet Take 1 tablet (25 mg total) by mouth 3 (three) times daily.  Marland Kitchen ipratropium-albuterol (DUONEB) 0.5-2.5 (3)  MG/3ML SOLN Take 3 mLs by nebulization every 6 (six) hours as needed (for shortness of breath).  . losartan (COZAAR) 100 MG tablet TAKE 128m BY MOUTH  DAILY  . lovastatin (MEVACOR) 40 MG tablet TAKE 1 TABLET BY MOUTH  DAILY IN THE EVENING  . metoprolol tartrate (LOPRESSOR) 25 MG tablet TAKE 1 TABLET BY MOUTH  TWICE A DAY  . OVER THE COUNTER MEDICATION daily.  . potassium chloride (K-DUR,KLOR-CON) 10 MEQ tablet TAKE 1 TABLET BY MOUTH  DAILY  . SYMBICORT 160-4.5 MCG/ACT inhaler USE 2 PUFFS TWO TIMES DAILY  . torsemide (DEMADEX) 20 MG tablet Take 20 mg by mouth every morning.  . [DISCONTINUED] hydrALAZINE (APRESOLINE) 10 MG tablet Take 1 tablet (10 mg total) by mouth 2 (two) times daily.   Facility-Administered Encounter Medications as of 06/20/2018  Medication  . heparin lock flush 100 unit/mL  . sodium chloride flush (NS) 0.9 % injection 10 mL    Review of Systems  Constitutional: Negative for appetite change and unexpected weight change.  HENT: Negative for congestion and sinus pressure.        Intermittent hoarseness.    Respiratory: Negative for cough and chest tightness.        Breathing stable.    Cardiovascular: Negative for chest pain, palpitations and leg swelling.  Gastrointestinal: Negative for abdominal pain, diarrhea, nausea and vomiting.  Genitourinary: Negative for difficulty urinating and dysuria.  Musculoskeletal: Negative for joint swelling and  myalgias.  Skin: Negative for color change and rash.  Neurological: Negative for dizziness, light-headedness and headaches.  Psychiatric/Behavioral: Negative for agitation and dysphoric mood.       Objective:    Physical Exam Constitutional:      General: She is not in acute distress.    Appearance: Normal appearance.  HENT:     Nose: Nose normal. No congestion.     Mouth/Throat:     Pharynx: No oropharyngeal exudate or posterior oropharyngeal erythema.  Neck:     Musculoskeletal: Neck supple. No muscular tenderness.     Thyroid: No thyromegaly.  Cardiovascular:     Rate and Rhythm: Normal rate and regular rhythm.  Pulmonary:     Effort: No respiratory distress.     Breath sounds: Normal breath sounds. No wheezing.  Abdominal:     General: Bowel sounds are normal.     Palpations: Abdomen is soft.     Tenderness: There is no abdominal tenderness.  Musculoskeletal:        General: No swelling or tenderness.  Lymphadenopathy:     Cervical: No cervical adenopathy.  Skin:    Findings: No erythema or rash.  Neurological:     Mental Status: She is alert.  Psychiatric:        Mood and Affect: Mood normal.        Behavior: Behavior normal.     BP (!) 156/62 (BP Location: Left Arm, Patient Position: Sitting, Cuff Size: Normal)   Pulse 72   Temp 98.1 F (36.7 C) (Oral)   Resp 16   Wt 138 lb (62.6 kg)   SpO2 96%   BMI 22.96 kg/m  Wt Readings from Last 3 Encounters:  06/20/18 138 lb (62.6 kg)  05/02/18 137 lb 3.2 oz (62.2 kg)  04/16/18 137 lb 4.8 oz (62.3 kg)     Lab Results  Component Value Date   WBC 11.2 (H) 04/15/2018   HGB 15.4 (H) 04/15/2018   HCT 44.9 04/15/2018   PLT 175 04/15/2018   GLUCOSE 129 (  H) 05/07/2018   CHOL 167 04/30/2018   TRIG 125.0 04/30/2018   HDL 63.30 04/30/2018   LDLCALC 78 04/30/2018   ALT 15 04/30/2018   AST 16 04/30/2018   NA 141 05/07/2018   K 3.7 05/07/2018   CL 103 05/07/2018   CREATININE 1.11 (H) 05/07/2018   BUN 23  05/07/2018   CO2 30 05/07/2018   TSH 5.78 (H) 04/30/2018   HGBA1C 5.6 04/30/2018    Ct Chest W Contrast  Result Date: 04/15/2018 CLINICAL DATA:  Invasive small cell bladder cancer. EXAM: CT CHEST, ABDOMEN, AND PELVIS WITH CONTRAST TECHNIQUE: Multidetector CT imaging of the chest, abdomen and pelvis was performed following the standard protocol during bolus administration of intravenous contrast. CONTRAST:  50m OMNIPAQUE IOHEXOL 300 MG/ML  SOLN COMPARISON:  12/10/2017. FINDINGS: CT CHEST FINDINGS Cardiovascular: Heart size upper normal. Coronary artery calcification is evident. Atherosclerotic calcification is noted in the wall of the thoracic aorta. Mediastinum/Nodes: No mediastinal lymphadenopathy. There is no hilar lymphadenopathy. The esophagus has normal imaging features. There is no axillary lymphadenopathy. Lungs/Pleura: The central tracheobronchial airways are patent. Centrilobular emphysema noted. No suspicious pulmonary nodule or mass. No focal airspace consolidation. No pleural effusion. Musculoskeletal: No worrisome lytic or sclerotic osseous abnormality. CT ABDOMEN PELVIS FINDINGS Hepatobiliary: No focal abnormality within the liver parenchyma. There is no evidence for gallstones, gallbladder wall thickening, or pericholecystic fluid. No intrahepatic or extrahepatic biliary dilation. Pancreas: No focal mass lesion. No dilatation of the main duct. No intraparenchymal cyst. No peripancreatic edema. Spleen: No splenomegaly. No focal mass lesion. Adrenals/Urinary Tract: No adrenal nodule or mass. Stable appearance bilateral renal cysts. No evidence for hydroureter. The urinary bladder appears normal for the degree of distention. Stomach/Bowel: Stomach is nondistended. No gastric wall thickening. No evidence of outlet obstruction. Duodenum is normally positioned as is the ligament of Treitz. No small bowel wall thickening. No small bowel dilatation. The terminal ileum is normal. The appendix is  normal. No gross colonic mass. No colonic wall thickening. Vascular/Lymphatic: There is abdominal aortic atherosclerosis without aneurysm. There is no gastrohepatic or hepatoduodenal ligament lymphadenopathy. No intraperitoneal or retroperitoneal lymphadenopathy. No pelvic sidewall lymphadenopathy. Reproductive: The uterus has normal CT imaging appearance. There is no adnexal mass. Other: No intraperitoneal free fluid. Musculoskeletal: No worrisome lytic or sclerotic osseous abnormality. IMPRESSION: 1. Stable exam. No new or progressive interval findings. Specifically, no evidence for metastatic disease in the chest, abdomen, or pelvis. 2.  Emphysema. (ICD10-J43.9) 3.  Aortic Atherosclerois (ICD10-170.0) Electronically Signed   By: EMisty StanleyM.D.   On: 04/15/2018 11:50   Ct Abdomen Pelvis W Contrast  Result Date: 04/15/2018 CLINICAL DATA:  Invasive small cell bladder cancer. EXAM: CT CHEST, ABDOMEN, AND PELVIS WITH CONTRAST TECHNIQUE: Multidetector CT imaging of the chest, abdomen and pelvis was performed following the standard protocol during bolus administration of intravenous contrast. CONTRAST:  781mOMNIPAQUE IOHEXOL 300 MG/ML  SOLN COMPARISON:  12/10/2017. FINDINGS: CT CHEST FINDINGS Cardiovascular: Heart size upper normal. Coronary artery calcification is evident. Atherosclerotic calcification is noted in the wall of the thoracic aorta. Mediastinum/Nodes: No mediastinal lymphadenopathy. There is no hilar lymphadenopathy. The esophagus has normal imaging features. There is no axillary lymphadenopathy. Lungs/Pleura: The central tracheobronchial airways are patent. Centrilobular emphysema noted. No suspicious pulmonary nodule or mass. No focal airspace consolidation. No pleural effusion. Musculoskeletal: No worrisome lytic or sclerotic osseous abnormality. CT ABDOMEN PELVIS FINDINGS Hepatobiliary: No focal abnormality within the liver parenchyma. There is no evidence for gallstones, gallbladder wall  thickening, or pericholecystic fluid.  No intrahepatic or extrahepatic biliary dilation. Pancreas: No focal mass lesion. No dilatation of the main duct. No intraparenchymal cyst. No peripancreatic edema. Spleen: No splenomegaly. No focal mass lesion. Adrenals/Urinary Tract: No adrenal nodule or mass. Stable appearance bilateral renal cysts. No evidence for hydroureter. The urinary bladder appears normal for the degree of distention. Stomach/Bowel: Stomach is nondistended. No gastric wall thickening. No evidence of outlet obstruction. Duodenum is normally positioned as is the ligament of Treitz. No small bowel wall thickening. No small bowel dilatation. The terminal ileum is normal. The appendix is normal. No gross colonic mass. No colonic wall thickening. Vascular/Lymphatic: There is abdominal aortic atherosclerosis without aneurysm. There is no gastrohepatic or hepatoduodenal ligament lymphadenopathy. No intraperitoneal or retroperitoneal lymphadenopathy. No pelvic sidewall lymphadenopathy. Reproductive: The uterus has normal CT imaging appearance. There is no adnexal mass. Other: No intraperitoneal free fluid. Musculoskeletal: No worrisome lytic or sclerotic osseous abnormality. IMPRESSION: 1. Stable exam. No new or progressive interval findings. Specifically, no evidence for metastatic disease in the chest, abdomen, or pelvis. 2.  Emphysema. (ICD10-J43.9) 3.  Aortic Atherosclerois (ICD10-170.0) Electronically Signed   By: Misty Stanley M.D.   On: 04/15/2018 11:50       Assessment & Plan:   Problem List Items Addressed This Visit    CAD (coronary artery disease)    Followed by cardiology.  Stable.        Relevant Medications   hydrALAZINE (APRESOLINE) 25 MG tablet   CHF (congestive heart failure) ([place])    Doing well on current regimen.  No evidence of volume overload on exam.  Follow.        Relevant Medications   hydrALAZINE (APRESOLINE) 25 MG tablet   CKD (chronic kidney disease), stage III  ([place])    Followed by nephrology.  Stable.  Follow metabolic panel.        COPD (chronic obstructive pulmonary disease) ([facility])    Sees pulmonary.  Stable on current regimen.        Essential hypertension    Blood pressure remains elevated.  Increased hydralazine to 45m tid.  Follow pressures.  Follow metabolic panel.        Relevant Medications   hydrALAZINE (APRESOLINE) 25 MG tablet   Other Relevant Orders   TSH   Basic metabolic panel   Hoarseness    Intermittent hoarseness.  Continue treatment of acid reflux.  Continue pulmonary regimen.  Discussed ENT evaluation.  She wants to monitor.  Follow.        Hypercholesterolemia    On lovastatin.  Low cholesterol diet and exercise.  Follow lipid panel and liver function tests.        Relevant Medications   hydrALAZINE (APRESOLINE) 25 MG tablet   Other Relevant Orders   Hepatic function panel   Lipid panel   Hyperglycemia    Low carb diet and exercise.  Follow met b and a1c.       Relevant Orders   Hemoglobin A1c   [place]    Followed by hematology.       Small cell carcinoma of bladder ([facility]    Being followed by urology and oncology.            CEinar Pheasant MD

## 2018-06-23 ENCOUNTER — Encounter: Payer: Self-pay | Admitting: Internal Medicine

## 2018-06-23 DIAGNOSIS — R490 Dysphonia: Secondary | ICD-10-CM | POA: Insufficient documentation

## 2018-06-23 NOTE — Assessment & Plan Note (Signed)
Doing well on current regimen.  No evidence of volume overload on exam.  Follow.

## 2018-06-23 NOTE — Assessment & Plan Note (Addendum)
Followed by nephrology.  Stable.  Follow metabolic panel.

## 2018-06-23 NOTE — Assessment & Plan Note (Signed)
Intermittent hoarseness.  Continue treatment of acid reflux.  Continue pulmonary regimen.  Discussed ENT evaluation.  She wants to monitor.  Follow.

## 2018-06-23 NOTE — Assessment & Plan Note (Signed)
Blood pressure remains elevated.  Increased hydralazine to 25mg  tid.  Follow pressures.  Follow metabolic panel.

## 2018-06-23 NOTE — Assessment & Plan Note (Signed)
Low carb diet and exercise.  Follow met b and a1c.  

## 2018-06-23 NOTE — Assessment & Plan Note (Signed)
Sees pulmonary.  Stable on current regimen.

## 2018-06-23 NOTE — Assessment & Plan Note (Signed)
Followed by cardiology. Stable.   

## 2018-06-23 NOTE — Assessment & Plan Note (Signed)
Being followed by urology and oncology.

## 2018-06-23 NOTE — Assessment & Plan Note (Signed)
On lovastatin.  Low cholesterol diet and exercise.  Follow lipid panel and liver function tests.   

## 2018-06-23 NOTE — Assessment & Plan Note (Signed)
Followed by hematology 

## 2018-07-10 DIAGNOSIS — L820 Inflamed seborrheic keratosis: Secondary | ICD-10-CM | POA: Diagnosis not present

## 2018-08-22 ENCOUNTER — Other Ambulatory Visit: Payer: Medicare Other

## 2018-08-26 ENCOUNTER — Ambulatory Visit (INDEPENDENT_AMBULATORY_CARE_PROVIDER_SITE_OTHER): Payer: Medicare Other | Admitting: Internal Medicine

## 2018-08-26 ENCOUNTER — Encounter: Payer: Self-pay | Admitting: Internal Medicine

## 2018-08-26 DIAGNOSIS — R490 Dysphonia: Secondary | ICD-10-CM

## 2018-08-26 DIAGNOSIS — R739 Hyperglycemia, unspecified: Secondary | ICD-10-CM

## 2018-08-26 DIAGNOSIS — I2510 Atherosclerotic heart disease of native coronary artery without angina pectoris: Secondary | ICD-10-CM | POA: Diagnosis not present

## 2018-08-26 DIAGNOSIS — I10 Essential (primary) hypertension: Secondary | ICD-10-CM

## 2018-08-26 DIAGNOSIS — J410 Simple chronic bronchitis: Secondary | ICD-10-CM | POA: Diagnosis not present

## 2018-08-26 DIAGNOSIS — N183 Chronic kidney disease, stage 3 unspecified: Secondary | ICD-10-CM

## 2018-08-26 DIAGNOSIS — D45 Polycythemia vera: Secondary | ICD-10-CM

## 2018-08-26 DIAGNOSIS — I5032 Chronic diastolic (congestive) heart failure: Secondary | ICD-10-CM

## 2018-08-26 DIAGNOSIS — E7800 Pure hypercholesterolemia, unspecified: Secondary | ICD-10-CM | POA: Diagnosis not present

## 2018-08-26 NOTE — Progress Notes (Addendum)
[name], female   DOB: May 30, 1934, 84 y.o.   MRN: [id number] Telephone Visit:  Note  This visit type was conducted due to national recommendations for restrictions regarding the COVID-19 pandemic (e.g. social distancing).  This format is felt to be most appropriate for this patient at this time.  All issues noted in this document were discussed and addressed.  No physical exam was performed (except for noted visual exam findings with Video Visits).   I connected with Tina [name] on 08/26/18 at  2:30 PM EDT by telephone and verified that I am speaking with the correct person using two identifiers. Location patient: home Location provider: work Persons participating in the virtual visit: patient, provider  I discussed the limitations, risks, security and privacy concerns of performing an evaluation and management service by telephone and the availability of in person appointments.  The patient expressed understanding and agreed to proceed.   Reason for visit: scheduled follow up.    HPI: She reports she is doing relatively well.  Has a history of CAD/CHF.  Saw Dr Saralyn Pilar 06/2018.  Felt stable.  On plavix.  Has f/u planned 08/29/18.  She feels heart is stable.  Denies any chest pain.  No increased heart rate or palpitations.  She also has COPD.  Sees Dr Raul Del.  Using her inhalers regularly.  Has been using her nebulizer machine.  States usually uses 2x/day.  Recently has been using 3-4x/day.  Notices if she goes outside, she will need to use when she comes back into her house.  Some minimal cough.  She feels is related to the pollen, etc.  She is going to start wearing a mask and limiting her time outside.  No significant cough or chest tightness.  Again feels her heart is stable.  She is exercising.  Has f/u with Dr Raul Del 10/31/18, but will let me or him know if feels symptoms are progressing.  States she is taking all of her blood pressure medications, including hydralazine tid.   Blood pressures averaging 407-680 systolic.  Some hoarseness.  Have discussed further w/up with her.  She declines ENT evaluation.  Will notify me if she changes her mind.  Saw oncology 04/2018.  Stable.  Recommended 6 month f/u regarding her bladder caner. She denies known COVID exposure. No fever.  No increased sob.      ROS: See pertinent positives and negatives per HPI.  Past Medical History:  Diagnosis Date  . Anemia   . Asthma   . Bladder cancer ([place])   . BRCA positive   . CAD (coronary artery disease)   . CHF (congestive heart failure) ([place])   . COPD (chronic obstructive pulmonary disease) ([place])   . Emphysema lung ([place])   . GERD (gastroesophageal reflux disease)   . History of colon polyps   . Hypercholesterolemia   . Hyperglycemia   . Hypertension   . Lung nodules   . Osteoporosis   . Personal history of tobacco use, presenting hazards to health 11/25/2014  . Polycythemia vera(238.4)   . Renal cyst   . Skin cancer     Past Surgical History:  Procedure Laterality Date  . ANGIOPLASTY     coronary (x1)  . COLONOSCOPY WITH PROPOFOL N/A 03/02/2015   Procedure: COLONOSCOPY WITH PROPOFOL;  Surgeon: Lollie Sails, MD;  Location: [facility] ENDOSCOPY;  Service: Endoscopy;  Laterality: N/A;  . COLONOSCOPY WITH PROPOFOL N/A 02/13/2017   Procedure: COLONOSCOPY WITH PROPOFOL;  Surgeon: [name], MD;  Location: [facility] ENDOSCOPY;  Service: Endoscopy;  Laterality: N/A;  . CORONARY ANGIOPLASTY    . CORONARY ARTERY BYPASS GRAFT  09/17/2017   pt denies  . CYSTOSCOPY W/ RETROGRADES Bilateral 02/13/2017   Procedure: CYSTOSCOPY WITH RETROGRADE PYELOGRAM;  Surgeon: Hollice Espy, MD;  Location: [facility] ORS;  Service: Urology;  Laterality: Bilateral;  . CYSTOSCOPY W/ RETROGRADES Bilateral 09/19/2017   Procedure: CYSTOSCOPY WITH RETROGRADE PYELOGRAM;  Surgeon: Hollice Espy, MD;  Location: [facility] ORS;  Service: Urology;  Laterality: Bilateral;  . CYSTOSCOPY WITH BIOPSY  03/26/2017   Procedure:  CYSTOSCOPY WITH BIOPSY;  Surgeon: Hollice Espy, MD;  Location: [facility] ORS;  Service: Urology;;  . Consuela Mimes WITH STENT PLACEMENT Bilateral 02/13/2017   Procedure: CYSTOSCOPY WITH STENT PLACEMENT;  Surgeon: Hollice Espy, MD;  Location: [facility] ORS;  Service: Urology;  Laterality: Bilateral;  . CYSTOSCOPY WITH STENT PLACEMENT Bilateral 03/26/2017   Procedure: CYSTOSCOPY WITH STENT EXCHANGE;  Surgeon: Hollice Espy, MD;  Location: [facility] ORS;  Service: Urology;  Laterality: Bilateral;  . ESOPHAGOGASTRODUODENOSCOPY (EGD) WITH PROPOFOL N/A 02/13/2017   Procedure: ESOPHAGOGASTRODUODENOSCOPY (EGD) WITH PROPOFOL;  Surgeon: [name], MD;  Location: [facility] ENDOSCOPY;  Service: Endoscopy;  Laterality: N/A;  . PORTA CATH INSERTION N/A 02/28/2017   Procedure: PORTA CATH INSERTION;  Surgeon: Algernon Huxley, MD;  Location: [place] CV LAB;  Service: Cardiovascular;  Laterality: N/A;  . PORTA CATH REMOVAL N/A 12/24/2017   Procedure: PORTA CATH REMOVAL;  Surgeon: Algernon Huxley, MD;  Location: [place] CV LAB;  Service: Cardiovascular;  Laterality: N/A;  . SALPINGOOPHORECTOMY    . TONSILECTOMY/ADENOIDECTOMY WITH MYRINGOTOMY    . TONSILLECTOMY    . TRANSURETHRAL RESECTION OF BLADDER TUMOR N/A 02/13/2017   Procedure: TRANSURETHRAL RESECTION OF BLADDER TUMOR (TURBT);  Surgeon: Hollice Espy, MD;  Location: [facility] ORS;  Service: Urology;  Laterality: N/A;  . TUBAL LIGATION    . UPPER GI ENDOSCOPY  02/13/2017  . URETEROSCOPY Left 09/19/2017   Procedure: URETEROSCOPY;  Surgeon: Hollice Espy, MD;  Location: [facility] ORS;  Service: Urology;  Laterality: Left;  Marland Kitchen VISCERAL ARTERY INTERVENTION N/A 02/12/2017   Procedure: VISCERAL ARTERY INTERVENTION;  Surgeon: Algernon Huxley, MD;  Location: [place] CV LAB;  Service: Cardiovascular;  Laterality: N/A;    Family History  Problem Relation Age of Onset  . Cirrhosis Father        died age 69  . Alcohol abuse Father   . Asthma Mother   . Congestive Heart Failure  Mother   . Breast cancer Mother        2 (1/2 sisters)  . Osteoarthritis Mother   . Colon cancer Mother   . Lupus Sister   . Alcohol abuse Sister   . Ovarian cancer Sister   . Osteoporosis Sister   . Skin cancer Sister   . Breast cancer Cousin   . Breast cancer Maternal Aunt     SOCIAL HX: reviewed.    Current Outpatient Medications:  .  albuterol (PROVENTIL HFA;VENTOLIN HFA) 108 (90 Base) MCG/ACT inhaler, Inhale 2 puffs into the lungs every 6 (six) hours as needed for wheezing or shortness of breath., Disp: 1 Inhaler, Rfl: 2 .  amLODipine (NORVASC) 5 MG tablet, Take 1 tablet (5 mg total) by mouth 2 (two) times daily., Disp: , Rfl:  .  cholecalciferol (VITAMIN D) 1000 units tablet, Take 1,000 Units by mouth daily., Disp: , Rfl:  .  clopidogrel (PLAVIX) 75 MG tablet, Take 1 tablet (75 mg total) by mouth daily., Disp: 30 tablet, Rfl:  0 .  fluticasone (FLONASE) 50 MCG/ACT nasal spray, Place 2 sprays into both nostrils daily as needed for allergies., Disp: 48 g, Rfl: 1 .  hydrALAZINE (APRESOLINE) 25 MG tablet, Take 1 tablet (25 mg total) by mouth 3 (three) times daily., Disp: 90 tablet, Rfl: 2 .  ipratropium-albuterol (DUONEB) 0.5-2.5 (3) MG/3ML SOLN, Take 3 mLs by nebulization every 6 (six) hours as needed (for shortness of breath)., Disp: 36 mL, Rfl: 0 .  losartan (COZAAR) 100 MG tablet, TAKE 139m BY MOUTH  DAILY, Disp: 90 tablet, Rfl: 1 .  lovastatin (MEVACOR) 40 MG tablet, TAKE 1 TABLET BY MOUTH  DAILY IN THE EVENING, Disp: 90 tablet, Rfl: 1 .  metoprolol tartrate (LOPRESSOR) 25 MG tablet, TAKE 1 TABLET BY MOUTH  TWICE A DAY, Disp: 180 tablet, Rfl: 1 .  OVER THE COUNTER MEDICATION, daily., Disp: , Rfl:  .  potassium chloride (K-DUR,KLOR-CON) 10 MEQ tablet, TAKE 1 TABLET BY MOUTH  DAILY, Disp: 90 tablet, Rfl: 1 .  SYMBICORT 160-4.5 MCG/ACT inhaler, USE 2 PUFFS TWO TIMES DAILY, Disp: 30.6 g, Rfl: 3 .  torsemide (DEMADEX) 20 MG tablet, Take 20 mg by mouth every morning., Disp: , Rfl:  11 No current facility-administered medications for this visit.   Facility-Administered Medications Ordered in Other Visits:  .  heparin lock flush 100 unit/mL, 500 Units, Intravenous, Once, RRanda EvensC, MD .  sodium chloride flush (NS) 0.9 % injection 10 mL, 10 mL, Intravenous, PRN, RSindy Guadeloupe MD  EXAM:  VITALS per patient if applicable: she reports systolic blood pressures averaging 140-150.    GENERAL: alert.  Answering questions appropriately.  Sounds to be in no acute distress.    PSYCH/NEURO: pleasant and cooperative, no obvious depression or anxiety, speech and thought processing grossly intact  ASSESSMENT AND PLAN:  Discussed the following assessment and plan:  Coronary artery disease involving native coronary artery of native heart without angina pectoris  Chronic diastolic congestive heart failure (HCC)  CKD (chronic kidney disease), stage III (HCC)  Simple chronic bronchitis (HCC)  Essential hypertension  Hoarseness  Hypercholesterolemia  Hyperglycemia  Polycythemia vera (HCC)  CAD (coronary artery disease) Followed by cardiology.  Stable.  Has f/u planned 08/29/18.   CHF ([place]estive heart failure) ([place] Has been doing well on current regimen.  Continue f/u with cardiology.   CKD (chronic kidney disease), stage III ([place] Followed by nephrology.  Has been stable.  Follow metabolic panel.    COPD (chronic obstructive pulmonary disease) Followed by pulmonary.  Continue inhalers.  Some minimal increased cough recently.  Discussed wearing a mask if she goes outside.  Some increase use of her nebulizer recently.  Feels related to the pollen, etc.  Will notify me of Dr FRaul Delif any change or worsening symptoms.    Essential hypertension Blood pressure as outlined.  Continue current medication regimen.  Follow pressures.  May need to increase hydralazine if persistent elevation.    Hoarseness Persistent.  See last note.  Continue pulmonary regimen and  treatment of acid reflux.  Again discussed ENT evaluation.  She declines.  Will notify me if she changes her mind.    Hypercholesterolemia On lovastatin.  Low cholesterol diet and exercise.  Follow lipid panel and liver function tests.    Hyperglycemia Low carb diet and exercise.  Follow met b and a1c.    [place] by hematology.     I discussed the assessment and treatment plan with the patient. The patient was provided an  opportunity to ask questions and all were answered. The patient agreed with the plan and demonstrated an understanding of the instructions.   The patient was advised to call back or seek an in-person evaluation if the symptoms worsen or if the condition fails to improve as anticipated.  I provided 25 minutes of non-face-to-face time during this encounter.   Einar Pheasant, MD

## 2018-08-31 ENCOUNTER — Encounter: Payer: Self-pay | Admitting: Internal Medicine

## 2018-08-31 NOTE — Assessment & Plan Note (Signed)
Followed by cardiology.  Stable.  Has f/u planned 08/29/18.

## 2018-08-31 NOTE — Assessment & Plan Note (Signed)
Followed by nephrology.  Has been stable.  Follow metabolic panel.

## 2018-08-31 NOTE — Assessment & Plan Note (Signed)
Persistent.  See last note.  Continue pulmonary regimen and treatment of acid reflux.  Again discussed ENT evaluation.  She declines.  Will notify me if she changes her mind.

## 2018-08-31 NOTE — Assessment & Plan Note (Signed)
Followed by hematology 

## 2018-08-31 NOTE — Assessment & Plan Note (Signed)
Followed by pulmonary.  Continue inhalers.  Some minimal increased cough recently.  Discussed wearing a mask if she goes outside.  Some increase use of her nebulizer recently.  Feels related to the pollen, etc.  Will notify me of Dr Raul Del if any change or worsening symptoms.

## 2018-08-31 NOTE — Assessment & Plan Note (Signed)
Blood pressure as outlined.  Continue current medication regimen.  Follow pressures.  May need to increase hydralazine if persistent elevation.

## 2018-08-31 NOTE — Assessment & Plan Note (Signed)
Low carb diet and exercise.  Follow met b and a1c.   

## 2018-08-31 NOTE — Assessment & Plan Note (Signed)
On lovastatin.  Low cholesterol diet and exercise.  Follow lipid panel and liver function tests.   

## 2018-08-31 NOTE — Assessment & Plan Note (Signed)
Has been doing well on current regimen.  Continue f/u with cardiology.

## 2018-09-03 ENCOUNTER — Telehealth: Payer: Self-pay

## 2018-09-03 NOTE — Telephone Encounter (Signed)
Telephone call to patient to discuss SCP visit over the phone.  Patient agreeable to have SCP mailed to her and I will call her on April 27th at 3:30 to go over her treatment sum[place] and she will also talk to APP for follow up. SCP packet mailed today.

## 2018-09-06 ENCOUNTER — Other Ambulatory Visit: Payer: Self-pay

## 2018-09-09 ENCOUNTER — Other Ambulatory Visit: Payer: Self-pay

## 2018-09-09 ENCOUNTER — Inpatient Hospital Stay: Payer: Medicare Other | Attending: Nurse Practitioner | Admitting: Nurse Practitioner

## 2018-09-09 DIAGNOSIS — R5383 Other fatigue: Secondary | ICD-10-CM | POA: Diagnosis not present

## 2018-09-09 DIAGNOSIS — C679 Malignant neoplasm of bladder, unspecified: Secondary | ICD-10-CM | POA: Diagnosis not present

## 2018-09-09 NOTE — Progress Notes (Signed)
Virtual Visit via Telephone Note  [facility]  Telephone:([phone] [phone]  I connected with Tina [name], on 09/09/2018 at 3:30 EST by telephone visit and verified that I am speaking with the correct person using two identifiers.   I discussed the limitations, risks, security and privacy concerns of performing an evaluation and management service by telemedicine and the availability of in-person appointments. I also discussed with the patient that there may be a patient responsible charge related to this service. The patient expressed understanding and agreed to proceed.   Other persons participating in the visit and their role in the encounter: none  Patient's location: home  Provider's location: home  Chief Complaint: Bladder Cancer  Patient agreed to evaluation by telephone/telemedicine to discuss Survivorship Care Plan detailing treatment of Bladder Cancer.   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 She has had jak 2 testing as well as EPO levels in the past which was unremarkable. She gets a phlebotomy when her hematocrit is more than 50. She was last seen by me in June 2018. She also has a history of lung nodules and was getting low-dose lung cancer screening scans on 07/04/2014 when her insurance stopped paying.   She presented to the hospital in October 2018  with symptoms of nausea and diarrhea weight loss and fatigue. She was noted to have irregular wall thickening in the ascending colon suspicious for colon cancer as well as 4.2 x 3.2 cm posterior bladder wall mass that was incidentally noted. She was also seen by vascular surgery and underwent abdominal angiogram with stent placement at the [facility]. She is on aspirin and Plavix for the same. Patient underwent colonoscopy for concerns of ischemic colitis which showed irregular thickening of the descending colon consistent with ischemic colitis. EGD showed a small hiatal hernia. Losing gastric ulcers were noted which was treated with argon photocoagulation.  With regards to the posterior bladder wall mass she was seen by Dr. Erlene Quan and underwent TURBT with bilateral ureteral stent placement on 02/13/2017. Pathology showed small cell carcinoma with muscularis propria invasion. 4 cycles of carbo/etoposide completed on 12/261/8. She completed RT following that as she was not deemed to be a surgical candidate. 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   Overall, she reports feeling well since completing treatment.  she offers no specific complaints today. She is socially distancing herself during the covid-19 pandemic.    REVIEW OF SYSTEMS:  Review of Systems  Constitutional: Negative.   HENT: Negative.   Eyes: Negative.  Respiratory: Negative.   Cardiovascular: Negative.   Gastrointestinal: Negative.   Genitourinary: Negative.   Musculoskeletal: Negative.   Skin:  Negative.   Neurological: Negative.   Endo/Heme/Allergies: Negative.   Psychiatric/Behavioral: Negative.     ONCOLOGY TREATMENT TEAM:  1. Surgeon:  Dr. Brandon/Urology 2. Medical Oncologist: Dr. Janese Banks 3. Radiation Oncologist: Dr. Baruch Gouty    PAST MEDICAL/SURGICAL HISTORY:  Past Medical History:  Diagnosis Date   Anemia    Asthma    Bladder cancer ([place])    BRCA positive    CAD (coronary artery disease)    CHF (congestive heart failure) (HCC)    COPD (chronic obstructive pulmonary disease) (HCC)    Emphysema lung (HCC)    GERD (gastroesophageal reflux disease)    History of colon polyps    Hypercholesterolemia    Hyperglycemia    Hypertension    Lung nodules    Osteoporosis    Personal history of tobacco use, presenting hazards to health 11/25/2014   Polycythemia vera(238.4)    Renal cyst    Skin cancer    Past Surgical History:  Procedure Laterality Date   ANGIOPLASTY     coronary (x1)   COLONOSCOPY WITH PROPOFOL N/A 03/02/2015   Procedure: COLONOSCOPY WITH PROPOFOL;  Surgeon: Lollie Sails, MD;  Location[facility] ENDOSCOPY;  Service: Endoscopy;  Laterality: N/A;   COLONOSCOPY WITH PROPOFOL N/A 02/13/2017   Procedure: COLONOSCOPY WITH PROPOFOL;  Surgeon: [name], MD;  Location: [facility] ENDOSCOPY;  Service: Endoscopy;  Laterality: N/A;   CORONARY ANGIOPLASTY     CORONARY ARTERY BYPASS GRAFT  09/17/2017   pt denies   CYSTOSCOPY W/ RETROGRADES Bilateral 02/13/2017   Procedure: CYSTOSCOPY WITH RETROGRADE PYELOGRAM;  Surgeon: Hollice Espy, MD;  Location: [facility] ORS;  Service: Urology;  Laterality: Bilateral;   CYSTOSCOPY W/ RETROGRADES Bilateral 09/19/2017   Procedure: CYSTOSCOPY WITH RETROGRADE PYELOGRAM;  Surgeon: Hollice Espy, MD;  Location: [facility] ORS;  Service: Urology;  Laterality: Bilateral;   CYSTOSCOPY WITH BIOPSY  03/26/2017   Procedure: CYSTOSCOPY WITH BIOPSY;  Surgeon: Hollice Espy, MD;  Location: [facility] ORS;  Service: Urology;;    CYSTOSCOPY WITH STENT PLACEMENT Bilateral 02/13/2017   Procedure: CYSTOSCOPY WITH STENT PLACEMENT;  Surgeon: Hollice Espy, MD;  Location: [facility] ORS;  Service: Urology;  Laterality: Bilateral;   CYSTOSCOPY WITH STENT PLACEMENT Bilateral 03/26/2017   Procedure: CYSTOSCOPY WITH STENT EXCHANGE;  Surgeon: Hollice Espy, MD;  Location: [facility] ORS;  Service: Urology;  Laterality: Bilateral;   ESOPHAGOGASTRODUODENOSCOPY (EGD) WITH PROPOFOL N/A 02/13/2017   Procedure: ESOPHAGOGASTRODUODENOSCOPY (EGD) WITH PROPOFOL;  Surgeon: [name], MD;  Location: [facility] ENDOSCOPY;  Service: Endoscopy;  Laterality: N/A;   PORTA CATH INSERTION N/A 02/28/2017   Procedure: PORTA CATH INSERTION;  Surgeon: Algernon Huxley, MD;  Location: [place] CV LAB;  Service: Cardiovascular;  Laterality: N/A;   PORTA CATH REMOVAL N/A 12/24/2017   Procedure: PORTA CATH REMOVAL;  Surgeon: Algernon Huxley, MD;  Location: [facility] [place];  Service: Cardiovascular;  Laterality: N/A;   SALPINGOOPHORECTOMY     TONSILECTOMY/ADENOIDECTOMY WITH MYRINGOTOMY     TONSILLECTOMY     TRANSURETHRAL RESECTION OF BLADDER TUMOR N/A 02/13/2017   Procedure: TRANSURETHRAL RESECTION OF BLADDER TUMOR (TURBT);  Surgeon: Hollice Espy, MD;  Location: [facility] ORS;  Service: Urology;  Laterality: N/A;   TUBAL LIGATION     UPPER GI ENDOSCOPY  02/13/2017   URETEROSCOPY Left 09/19/2017   Procedure: URETEROSCOPY;  Surgeon: Hollice Espy, MD;  Location: [facility] ORS;  Service: Urology;  Laterality: Left;  VISCERAL ARTERY INTERVENTION N/A 02/12/2017   Procedure: VISCERAL ARTERY INTERVENTION;  Surgeon: Algernon Huxley, MD;  Location: [place] [facility];  Service: Cardiovascular;  Laterality: N/A;     ALLERGIES:  Allergies  Allergen Reactions   Evista [Raloxifene] Other (See Comments)    Night sweats    Fluticasone-Salmeterol Other (See Comments)    Cough, "chokes me"      CURRENT MEDICATIONS:  Outpatient Encounter Medications as of 09/09/2018    Medication Sig Note   albuterol (PROVENTIL HFA;VENTOLIN HFA) 108 (90 Base) MCG/ACT inhaler Inhale 2 puffs into the lungs every 6 (six) hours as needed for wheezing or shortness of breath.    amLODipine (NORVASC) 5 MG tablet Take 1 tablet (5 mg total) by mouth 2 (two) times daily. 03/22/2018: Takes once day   cholecalciferol (VITAMIN D) 1000 units tablet Take 1,000 Units by mouth daily.    clopidogrel (PLAVIX) 75 MG tablet Take 1 tablet (75 mg total) by mouth daily.    fluticasone (FLONASE) 50 MCG/ACT nasal spray Place 2 sprays into both nostrils daily as needed for allergies.    hydrALAZINE (APRESOLINE) 25 MG tablet Take 1 tablet (25 mg total) by mouth 3 (three) times daily.    ipratropium-albuterol (DUONEB) 0.5-2.5 (3) MG/3ML SOLN Take 3 mLs by nebulization every 6 (six) hours as needed (for shortness of breath).    losartan (COZAAR) 100 MG tablet TAKE 190m BY MOUTH  DAILY    lovastatin (MEVACOR) 40 MG tablet TAKE 1 TABLET BY MOUTH  DAILY IN THE EVENING    metoprolol tartrate (LOPRESSOR) 25 MG tablet TAKE 1 TABLET BY MOUTH  TWICE A DAY    OVER THE COUNTER MEDICATION daily.    potassium chloride (K-DUR,KLOR-CON) 10 MEQ tablet TAKE 1 TABLET BY MOUTH  DAILY    SYMBICORT 160-4.5 MCG/ACT inhaler USE 2 PUFFS TWO TIMES DAILY 04/16/2018: Per patient, taken PRN   torsemide (DEMADEX) 20 MG tablet Take 20 mg by mouth every morning.    Facility-Administered Encounter Medications as of 09/09/2018  Medication   heparin lock flush 100 unit/mL   sodium chloride flush (NS) 0.9 % injection 10 mL     ONCOLOGIC FAMILY HISTORY:  Family History  Problem Relation Age of Onset   Cirrhosis Father        died age 82  Alcohol abuse Father    Asthma Mother    Congestive Heart Failure Mother    Breast cancer Mother        2 (1/2 sisters)   Osteoarthritis Mother    Colon cancer Mother    Lupus Sister    Alcohol abuse Sister    Ovarian cancer Sister    Osteoporosis Sister     Skin cancer Sister    Breast cancer Cousin    Breast cancer Maternal Aunt     SOCIAL HISTORY:  Social History   Socioeconomic History   Marital status: Widowed    Spouse name: Not on file   Number of children: 2   Years of education: Not on file   Highest education level: Not on file  Occupational History   Not on file  Social Needs   Financial resource strain: Not hard at all   Food insecurity:    Worry: Never true    Inability: Never true   Transportation needs:    Medical: No    Non-medical: No  Tobacco Use   Smoking status: Former Smoker    Packs/day: 1.00    Years: 45.00  Pack years: 45.00    Types: Cigarettes    Last attempt to quit: 11/11/2003    Years since quitting: 14.8   Smokeless tobacco: Never Used  Substance and Sexual Activity   Alcohol use: No    Alcohol/week: 0.0 standard drinks    Comment: occasional   Drug use: No   Sexual activity: Never  Lifestyle   Physical activity:    Days per week: 2 days    Minutes per session: 90 min   Stress: Not at all  Relationships   Social connections:    Talks on phone: Not on file    Gets together: Not on file    Attends religious service: Not on file    Active member of club or organization: Not on file    Attends meetings of clubs or organizations: Not on file    Relationship status: Not on file  Other Topics Concern   Not on file  Social History Narrative   Not on file    PHYSICAL EXAMINATION:  Exam deferred due to telephone visit   ASSESSMENT AND PLAN:  Tina [name] is a pleasant 84 y.o. 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 She underwent TURBT with bilateral ureteral stent placement followed by by 4 cycles of carboplatin-etoposide (comleted 05/09/2017) followed by radiation therapy as she was felt not to be a surgical candidate. Most imaging showed no evidence of disease and continued remission. She presents to the Survivorship Clinic for our  initial meeting and routine follow-up post-completion of treatment.    1. Stage II Bladder Cancer - Tina [name] is continuing to recover from definitive treatment for muscle invasive small cell carcinoma of bladder (Stage II (cT2 cN0, cM0). She will follow-up with her medical oncologist, Dr. Janese Banks in June 2020 with history and physical exam per surveillance protocol.     Today, a comprehensive survivorship care plan and treatment summary was reviewed with the patient today detailing cancer diagnosis, treatment course, potential late/long-term effects of treatment, appropriate follow-up care with recommendations for the future, and patient education resources.  We discussed recommended follow up care per NCCN guidelines including cystoscopy, imaging, and lab work including urine tests every 3-6 months for the first 2 years then every 6 months for year 3-4 then annually or as clinically indicated.   Follow up CT scans scheduled for 10/18/2018.  2. Transitioning to Surveillance: Patient has an established relationship with primary care provider and today's survivorship care plan will be transmitted to PCP continue to provide long-term care for patient.   3. Fatigue- She reports moderate fatigue ongoing since completing her treatment for bladder cancer. Review of previous labs showed elevated TSH of 5.78. She also has a history of hyperglycemia, COPD, CAD, PCV, and other multiple comorbidities which may be contributing.   We discussed that chemotherapy and radiation are predisposing factors for cancer related fatigue and can occur in patients for up to one year following treatment however the time course is unique to each patient and his/her treatment plan. We discussed some of the things that can cause fatigue including comorbidities. I offered her appointment with our Symptom Management Clinic to evaluate/work-up her fatigue which she declines at this time but will consider if ongoing. In the meantime we  discussed self monitoring of fatigue levels, energy conservation such as priority setting, pacing, and scheduling activities. We discussed mindfulness to reduce stress and supportive programs as well as good nutrition, sleep hygiene, and exercise.   4. Port-a-Cath:  port removed  5. Health maintenance and wellness promotion: Tina [name] was encouraged to consume 5-7 servings of fruits and vegetables per day. We reviewed the "Nutrition Rainbow" handout, as well as the handout "Take Control of Your Health and Reduce Your Cancer Risk" from the [employer].  she  was also encouraged to engage in moderate to vigorous exercise for 30 minutes per day most days of the week. We discussed the [employer] fitness program & the CARE program, which is designed for cancer survivors to help them become more physically fit after cancer treatments. she was instructed to limit her alcohol consumption and continue to abstain from tobacco use.     6. Support services/counseling: It is not uncommon for this period of the patient's cancer care trajectory to be one of many emotions and stressors.  We discussed an opportunity for her to participate in the next session of [facility] ("Finding Your New Normal") support group series designed for patients after they have completed treatment.   Tina [name] was encouraged to take advantage of our many other support services programs, support groups, and/or counseling in coping with life as a cancer survivor/after completing anti-cancer treatment.  she  was offered support today through active listening and expressive supportive counseling.  she was given information regarding our available services and encouraged to contact me with any questions or for help enrolling in any of our support group/programs.   7. Cancer screening:  Due to Tina [name]'s history and her age, she should continue to be screened for cancers such as skin, colon, and gyn cancers through her PCP. The information  and recommendations are listed on the patient's comprehensive care plan/treatment summary and were reviewed in detail with the patient.     Dispo:   -Return to cancer center on 10/18/2018 for follow up with Dr. Janese Banks -Return back to the Survivorship Clinic at any time; no additional follow-up needed at this time.   I discussed the assessment and treatment plan with the patient. The patient was provided an opportunity to ask questions and all were answered. The patient agreed with the plan and demonstrated an understanding of the instructions.   The patient was advised to call back or seek an in-person evaluation if the symptoms worsen or if the condition fails to improve as anticipated.   I provided 22 minutes of non face-to-face telephone visit time during this encounter, and > 50% was spent counseling as documented under my assessment & plan.  Beckey Rutter, DNP, AGNP-C [place] [facility] [place] (work cell) [phone] (office)   Note: [place], [place], [place] [phone]   CC: Dr. Janese Banks

## 2018-09-09 NOTE — Progress Notes (Signed)
Survivorship Care Plan visit completed.  Treatment summary reviewed and mailed to patient last week.   ASCO answers booklet reviewed and had been mailed to patient.  CARE program and Cancer Transitions discussed with patient along with other resources cancer center offers to patients and caregivers.  Patient verbalized understanding.

## 2018-09-12 DIAGNOSIS — R0602 Shortness of breath: Secondary | ICD-10-CM | POA: Diagnosis not present

## 2018-09-12 DIAGNOSIS — J449 Chronic obstructive pulmonary disease, unspecified: Secondary | ICD-10-CM | POA: Diagnosis not present

## 2018-09-12 DIAGNOSIS — N183 Chronic kidney disease, stage 3 (moderate): Secondary | ICD-10-CM | POA: Diagnosis not present

## 2018-09-12 DIAGNOSIS — Z955 Presence of coronary angioplasty implant and graft: Secondary | ICD-10-CM | POA: Diagnosis not present

## 2018-09-12 DIAGNOSIS — I2510 Atherosclerotic heart disease of native coronary artery without angina pectoris: Secondary | ICD-10-CM | POA: Diagnosis not present

## 2018-09-12 DIAGNOSIS — E7800 Pure hypercholesterolemia, unspecified: Secondary | ICD-10-CM | POA: Diagnosis not present

## 2018-09-12 DIAGNOSIS — I5031 Acute diastolic (congestive) heart failure: Secondary | ICD-10-CM | POA: Diagnosis not present

## 2018-09-12 DIAGNOSIS — R002 Palpitations: Secondary | ICD-10-CM | POA: Diagnosis not present

## 2018-09-12 DIAGNOSIS — I10 Essential (primary) hypertension: Secondary | ICD-10-CM | POA: Diagnosis not present

## 2018-09-19 ENCOUNTER — Telehealth: Payer: Self-pay | Admitting: Internal Medicine

## 2018-09-19 NOTE — Telephone Encounter (Unsigned)
Copied from [place] [phone]. Topic: Quick Communication - Rx Refill/Question >> Sep 19, 2018  1:37 PM Celene Kras A wrote: Medication: hydrALAZINE (APRESOLINE) 25 MG tablet   Has the patient contacted their pharmacy? Yes.   (Agent: If no, request that the patient contact the pharmacy for the refill.) (Agent: If yes, when and what did the pharmacy advise?)  Preferred Pharmacy (with phone number or street name): [address], [place] [address] Phone: [phone] Fax: [phone] Not a 24 hour pharmacy; exact hours not known.    Agent: Please be advised that RX refills may take up to 3 business days. We ask that you follow-up with your pharmacy.

## 2018-09-20 ENCOUNTER — Telehealth: Payer: Self-pay | Admitting: Internal Medicine

## 2018-09-20 NOTE — Telephone Encounter (Signed)
Copied from [place] [id number]. Topic: Quick Communication - Rx Refill/Question >> Sep 20, 2018  4:40 PM Mcneil, Ja-Kwan wrote: Medication: hydrALAZINE (APRESOLINE) 25 MG tablet  Has the patient contacted their pharmacy? yes   Preferred Pharmacy (with phone number or street name): [place], [place] - [place] [phone] (Phone)  [phone] (Fax)  Agent: Please be advised that RX refills may take up to 3 business days. We ask that you follow-up with your pharmacy.

## 2018-09-23 MED ORDER — HYDRALAZINE HCL 25 MG PO TABS: 25.0000 mg | ORAL_TABLET | Freq: Three times a day (TID) | ORAL | 2 refills | Status: DC

## 2018-09-23 NOTE — Telephone Encounter (Signed)
rx ok'd for hydralazine #90 with 2 refills.

## 2018-09-27 ENCOUNTER — Other Ambulatory Visit: Payer: Self-pay | Admitting: Internal Medicine

## 2018-10-01 ENCOUNTER — Other Ambulatory Visit: Payer: Self-pay | Admitting: Internal Medicine

## 2018-10-17 ENCOUNTER — Other Ambulatory Visit: Payer: Self-pay

## 2018-10-17 ENCOUNTER — Encounter: Payer: Self-pay | Admitting: Internal Medicine

## 2018-10-17 ENCOUNTER — Ambulatory Visit (INDEPENDENT_AMBULATORY_CARE_PROVIDER_SITE_OTHER): Payer: Medicare Other

## 2018-10-17 ENCOUNTER — Ambulatory Visit (INDEPENDENT_AMBULATORY_CARE_PROVIDER_SITE_OTHER): Payer: Medicare Other | Admitting: Internal Medicine

## 2018-10-17 DIAGNOSIS — Z0000 Encounter for general adult medical examination without abnormal findings: Secondary | ICD-10-CM

## 2018-10-17 DIAGNOSIS — I2510 Atherosclerotic heart disease of native coronary artery without angina pectoris: Secondary | ICD-10-CM | POA: Diagnosis not present

## 2018-10-17 DIAGNOSIS — D45 Polycythemia vera: Secondary | ICD-10-CM | POA: Diagnosis not present

## 2018-10-17 DIAGNOSIS — N183 Chronic kidney disease, stage 3 unspecified: Secondary | ICD-10-CM

## 2018-10-17 DIAGNOSIS — R2681 Unsteadiness on feet: Secondary | ICD-10-CM | POA: Diagnosis not present

## 2018-10-17 DIAGNOSIS — C679 Malignant neoplasm of bladder, unspecified: Secondary | ICD-10-CM

## 2018-10-17 DIAGNOSIS — I10 Essential (primary) hypertension: Secondary | ICD-10-CM | POA: Diagnosis not present

## 2018-10-17 DIAGNOSIS — J410 Simple chronic bronchitis: Secondary | ICD-10-CM

## 2018-10-17 DIAGNOSIS — R8290 Unspecified abnormal findings in urine: Secondary | ICD-10-CM | POA: Diagnosis not present

## 2018-10-17 DIAGNOSIS — E7800 Pure hypercholesterolemia, unspecified: Secondary | ICD-10-CM | POA: Diagnosis not present

## 2018-10-17 DIAGNOSIS — R739 Hyperglycemia, unspecified: Secondary | ICD-10-CM

## 2018-10-17 NOTE — Progress Notes (Signed)
[name], female   DOB: 1934/09/24, 84 y.o.   MRN: [id number]   Virtual Visit via telephone Note  This visit type was conducted due to national recommendations for restrictions regarding the COVID-19 pandemic (e.g. social distancing).  This format is felt to be most appropriate for this patient at this time.  All issues noted in this document were discussed and addressed.  No physical exam was performed (except for noted visual exam findings with Video Visits).   I connected with Tina [name] by telephone and verified that I am speaking with the correct person using two identifiers. Location patient: home Location provider: work or home office Persons participating in the virtual visit: patient, provider  I discussed the limitations, risks, security and privacy concerns of performing an evaluation and management service by telephone and the availability of in person appointments. The patient expressed understanding and agreed to proceed.   Reason for visit: scheduled follow up.   HPI: She reports she is still feeling weak.  Saw cardiology 09/12/18.  Felt stable.  Discussed deconditioning and her underlying lung issues.  Feels her breathing is stable.  No increased cough or congestion.  No wheezing.  States when she walks out to the mailbox or has increased activity, notices more shortness of breath with exertion. States she just "gives out".  Fell x 2. No syncope or LOC.   After discussion, she request referral back to cardiology to confirm no further w/up warranted.  Also discussed physical therapy for strengthening exercises.  Describes legs feel weak.  Also some issues with her gait and balance.  No abdominal pain.  Bowels moving.  Does report noticed an odor - urine.  Sees oncology (Dr Janese Banks) for f/u history of bladder cancer.  Scheduled for CT chest, abdomen and pelvis - tomorrow.     ROS: See pertinent positives and negatives per HPI.  Past Medical History:  Diagnosis Date   . Anemia   . Asthma   . Bladder cancer ([place])   . BRCA positive   . CAD (coronary artery disease)   . CHF (congestive heart failure) ([place])   . COPD (chronic obstructive pulmonary disease) ([place])   . Emphysema lung ([place])   . GERD (gastroesophageal reflux disease)   . History of colon polyps   . Hypercholesterolemia   . Hyperglycemia   . Hypertension   . Lung nodules   . Osteoporosis   . Personal history of tobacco use, presenting hazards to health 11/25/2014  . Polycythemia vera(238.4)   . Renal cyst   . Skin cancer     Past Surgical History:  Procedure Laterality Date  . ANGIOPLASTY     coronary (x1)  . COLONOSCOPY WITH PROPOFOL N/A 03/02/2015   Procedure: COLONOSCOPY WITH PROPOFOL;  Surgeon: Lollie Sails, MD;  Location: [facility] ENDOSCOPY;  Service: Endoscopy;  Laterality: N/A;  . COLONOSCOPY WITH PROPOFOL N/A 02/13/2017   Procedure: COLONOSCOPY WITH PROPOFOL;  Surgeon: [name], MD;  Location: [facility] ENDOSCOPY;  Service: Endoscopy;  Laterality: N/A;  . CORONARY ANGIOPLASTY    . CORONARY ARTERY BYPASS GRAFT  09/17/2017   pt denies  . CYSTOSCOPY W/ RETROGRADES Bilateral 02/13/2017   Procedure: CYSTOSCOPY WITH RETROGRADE PYELOGRAM;  Surgeon: Hollice Espy, MD;  Location: [facility] ORS;  Service: Urology;  Laterality: Bilateral;  . CYSTOSCOPY W/ RETROGRADES Bilateral 09/19/2017   Procedure: CYSTOSCOPY WITH RETROGRADE PYELOGRAM;  Surgeon: Hollice Espy, MD;  Location: [facility] ORS;  Service: Urology;  Laterality: Bilateral;  . CYSTOSCOPY WITH BIOPSY  03/26/2017   Procedure: CYSTOSCOPY WITH BIOPSY;  Surgeon: Hollice Espy, MD;  Location: [facility] ORS;  Service: Urology;;  . Consuela Mimes WITH STENT PLACEMENT Bilateral 02/13/2017   Procedure: CYSTOSCOPY WITH STENT PLACEMENT;  Surgeon: Hollice Espy, MD;  Location: [facility] ORS;  Service: Urology;  Laterality: Bilateral;  . CYSTOSCOPY WITH STENT PLACEMENT Bilateral 03/26/2017   Procedure: CYSTOSCOPY WITH STENT EXCHANGE;  Surgeon: Hollice Espy,  MD;  Location: [facility] ORS;  Service: Urology;  Laterality: Bilateral;  . ESOPHAGOGASTRODUODENOSCOPY (EGD) WITH PROPOFOL N/A 02/13/2017   Procedure: ESOPHAGOGASTRODUODENOSCOPY (EGD) WITH PROPOFOL;  Surgeon: [name], MD;  Location: [facility] ENDOSCOPY;  Service: Endoscopy;  Laterality: N/A;  . PORTA CATH INSERTION N/A 02/28/2017   Procedure: PORTA CATH INSERTION;  Surgeon: Algernon Huxley, MD;  Location: [place] CV LAB;  Service: Cardiovascular;  Laterality: N/A;  . PORTA CATH REMOVAL N/A 12/24/2017   Procedure: PORTA CATH REMOVAL;  Surgeon: Algernon Huxley, MD;  Location: [place] CV LAB;  Service: Cardiovascular;  Laterality: N/A;  . SALPINGOOPHORECTOMY    . TONSILECTOMY/ADENOIDECTOMY WITH MYRINGOTOMY    . TONSILLECTOMY    . TRANSURETHRAL RESECTION OF BLADDER TUMOR N/A 02/13/2017   Procedure: TRANSURETHRAL RESECTION OF BLADDER TUMOR (TURBT);  Surgeon: Hollice Espy, MD;  Location: [facility] ORS;  Service: Urology;  Laterality: N/A;  . TUBAL LIGATION    . UPPER GI ENDOSCOPY  02/13/2017  . URETEROSCOPY Left 09/19/2017   Procedure: URETEROSCOPY;  Surgeon: Hollice Espy, MD;  Location: [facility] ORS;  Service: Urology;  Laterality: Left;  Marland Kitchen VISCERAL ARTERY INTERVENTION N/A 02/12/2017   Procedure: VISCERAL ARTERY INTERVENTION;  Surgeon: Algernon Huxley, MD;  Location: [place] CV LAB;  Service: Cardiovascular;  Laterality: N/A;    Family History  Problem Relation Age of Onset  . Cirrhosis Father        died age 57  . Alcohol abuse Father   . Asthma Mother   . Congestive Heart Failure Mother   . Breast cancer Mother        2 (1/2 sisters)  . Osteoarthritis Mother   . Colon cancer Mother   . Lupus Sister   . Alcohol abuse Sister   . Ovarian cancer Sister   . Osteoporosis Sister   . Skin cancer Sister   . Breast cancer Cousin   . Breast cancer Maternal Aunt     SOCIAL HX: reviewed.    Current Outpatient Medications:  .  albuterol (PROVENTIL HFA;VENTOLIN HFA) 108 (90 Base) MCG/ACT  inhaler, Inhale 2 puffs into the lungs every 6 (six) hours as needed for wheezing or shortness of breath., Disp: 1 Inhaler, Rfl: 2 .  amLODipine (NORVASC) 5 MG tablet, TAKE 1 TABLET BY MOUTH TWO  TIMES DAILY, Disp: 180 tablet, Rfl: 1 .  cholecalciferol (VITAMIN D) 1000 units tablet, Take 1,000 Units by mouth daily., Disp: , Rfl:  .  clopidogrel (PLAVIX) 75 MG tablet, Take 1 tablet (75 mg total) by mouth daily., Disp: 30 tablet, Rfl: 0 .  fluticasone (FLONASE) 50 MCG/ACT nasal spray, USE 2 SPRAYS INTO BOTH  NOSTRILS DAILY AS NEEDED  FOR ALLERGIES., Disp: 48 g, Rfl: 1 .  hydrALAZINE (APRESOLINE) 25 MG tablet, Take 1 tablet (25 mg total) by mouth 3 (three) times daily., Disp: 90 tablet, Rfl: 2 .  ipratropium-albuterol (DUONEB) 0.5-2.5 (3) MG/3ML SOLN, Take 3 mLs by nebulization every 6 (six) hours as needed (for shortness of breath)., Disp: 36 mL, Rfl: 0 .  losartan (COZAAR) 100 MG tablet, TAKE 1 TABLET BY MOUTH ONCE A  DAY, Disp: 90 tablet, Rfl: 1 .  lovastatin (MEVACOR) 40 MG tablet, TAKE 1 TABLET BY MOUTH  DAILY IN THE EVENING, Disp: 90 tablet, Rfl: 1 .  metoprolol tartrate (LOPRESSOR) 25 MG tablet, TAKE 1 TABLET BY MOUTH  TWICE A DAY, Disp: 180 tablet, Rfl: 1 .  OVER THE COUNTER MEDICATION, daily., Disp: , Rfl:  .  potassium chloride (K-DUR,KLOR-CON) 10 MEQ tablet, TAKE 1 TABLET BY MOUTH  DAILY, Disp: 90 tablet, Rfl: 1 .  SYMBICORT 160-4.5 MCG/ACT inhaler, USE 2 PUFFS TWO TIMES DAILY, Disp: 30.6 g, Rfl: 3 .  torsemide (DEMADEX) 20 MG tablet, Take 20 mg by mouth every morning., Disp: , Rfl: 11 No current facility-administered medications for this visit.   Facility-Administered Medications Ordered in Other Visits:  .  heparin lock flush 100 unit/mL, 500 Units, Intravenous, Once, Randa Evens C, MD .  sodium chloride flush (NS) 0.9 % injection 10 mL, 10 mL, Intravenous, PRN, Sindy Guadeloupe, MD  EXAM:  GENERAL: alert.  Sounds to be in no acute distress.  Answering questions appropriately.     PSYCH/NEURO: pleasant and cooperative, no obvious depression or anxiety, speech and thought processing grossly intact  ASSESSMENT AND PLAN:  Discussed the following assessment and plan:  Bad odor of urine - Plan: Urinalysis, Routine w reflex microscopic, Urine Culture  Coronary artery disease involving native coronary artery of native heart without angina pectoris  CKD (chronic kidney disease), stage III (HCC)  Simple chronic bronchitis (HCC)  Essential hypertension  Hypercholesterolemia  Hyperglycemia  Polycythemia vera (HCC)  Small cell carcinoma of bladder (HCC)  Unsteadiness - Plan: Ambulatory referral to Home Health  CAD (coronary artery disease) Followed by cardiology.  Has noticed more increased sob with exertion.  Increased fatigue.  Decreased energy.  Discussed further w/up.  Will check routine labs and urine.  Also, have cardiology reevaluate to confirm no further cardiac w/up warranted.  Breathing stable.  Currently with no increased cough, congestion or wheezing.  Arrange PT.    CKD (chronic kidney disease), stage III ([place]) Followed by nephrology.  Follow metabolic panel.   COPD (chronic obstructive pulmonary disease) Followed by pulmonary.  Continue inhalers.  Breathing stable.  No increased cough, congestion or wheezing.    Essential hypertension Blood pressure stable.  Continue current medication regimen.  Follow pressures.  Follow metabolic panel.    Hypercholesterolemia On lovastatin.  Low cholesterol diet and exercise.  Follow lipid panel and liver function tests.    Hyperglycemia Low carb diet and exercise.  Follow met b and a1c.    Polycythemia vera ([place]) Followed by hematology.    Small cell carcinoma of bladder ([place]) Followed by urology and hematology.    Unsteadiness With weakness and unsteadiness as outlined.  Will see if can arrange home health PT for evaluation and treatment.      I discussed the assessment and treatment plan with the  patient. The patient was provided an opportunity to ask questions and all were answered. The patient agreed with the plan and demonstrated an understanding of the instructions.   The patient was advised to call back or seek an in-person evaluation if the symptoms worsen or if the condition fails to improve as anticipated.  I provided 25 minutes of non-face-to-face time during this encounter.   Einar Pheasant, MD

## 2018-10-17 NOTE — Patient Instructions (Addendum)
  Ms. Yebra , Thank you for taking time to come for your Medicare Wellness Visit. I appreciate your ongoing commitment to your health goals. Please review the following plan we discussed and let me know if I can assist you in the future.   These are the goals we discussed: Goals      Patient Stated   . Increase physical activity (pt-stated)     Resume line dancing        This is a list of the screening recommended for you and due dates:  Health Maintenance  Topic Date Due  . Tetanus Vaccine  12/06/1953  . Mammogram  09/07/2018  . Flu Shot  12/14/2018  . Colon Cancer Screening  02/13/2022  . DEXA scan (bone density measurement)  Completed  . Pneumonia vaccines  Completed

## 2018-10-17 NOTE — Progress Notes (Signed)
Subjective:   Tina [name] is a 84 y.o. female who presents for Medicare Annual (Subsequent) preventive examination.  Review of Systems:  No ROS.  Medicare Wellness Virtual Visit.  Visual/audio telehealth visit, UTA vital signs.   See social history for additional risk factors.  Cardiac Risk Factors include: advanced age (>42mn, >>38women);hypertension     Objective:     Vitals: There were no vitals taken for this visit.  There is no height or weight on file to calculate BMI.  Advanced Directives 10/17/2018 04/16/2018 12/24/2017 12/13/2017 10/20/2017 10/15/2017 09/19/2017  Does Patient Have a Medical Advance Directive? Yes Yes No No Yes Yes Yes  Type of AParamed[place]of [place] will [place] will - - - Living will;Healthcare Power of [place]  Does patient want to make changes to medical advance directive? No - Patient declined No - Patient declined - No - Patient declined - No - Patient declined No - Patient declined  Copy of HGila Bendin Chart? Yes - validated most recent copy scanned in chart (See row information) Yes - validated most recent copy scanned in chart (See row information) - No - copy requested - No - copy requested No - copy requested  Would patient like information on creating a medical advance directive? - - No - Patient declined No - Patient declined - - -    Tobacco Social History   Tobacco Use  Smoking Status Former Smoker  . Packs/day: 1.00  . Years: 45.00  . Pack years: 45.00  . Types: Cigarettes  . Last attempt to quit: 11/11/2003  . Years since quitting: 14.9  Smokeless Tobacco Never Used     Counseling given: Not Answered   Clinical Intake:  Pre-visit preparation completed: Yes        Diabetes: No  How often do you need to have someone help you when you read instructions, pamphlets, or other written materials from your doctor or  pharmacy?: 1 - Never  Interpreter Needed?: No     Past Medical History:  Diagnosis Date  . Anemia   . Asthma   . Bladder cancer ([place]   . BRCA positive   . CAD (coronary artery disease)   . CHF (congestive heart failure) ([place]   . COPD (chronic obstructive pulmonary disease) ([place]   . Emphysema lung ([place]   . GERD (gastroesophageal reflux disease)   . History of colon polyps   . Hypercholesterolemia   . Hyperglycemia   . Hypertension   . Lung nodules   . Osteoporosis   . Personal history of tobacco use, presenting hazards to health 11/25/2014  . Polycythemia vera(238.4)   . Renal cyst   . Skin cancer    Past Surgical History:  Procedure Laterality Date  . ANGIOPLASTY     coronary (x1)  . COLONOSCOPY WITH PROPOFOL N/A 03/02/2015   Procedure: COLONOSCOPY WITH PROPOFOL;  Surgeon: MLollie Sails MD;  [facility]  Service: Endoscopy;  Laterality: N/A;  . COLONOSCOPY WITH PROPOFOL N/A 02/13/2017   Procedure: COLONOSCOPY WITH PROPOFOL;  Surgeon: WLucilla Lame MD;  [facility];  Service: Endoscopy;  Laterality: N/A;  . CORONARY ANGIOPLASTY    . CORONARY ARTERY BYPASS GRAFT  09/17/2017   pt denies  . CYSTOSCOPY W/ RETROGRADES Bilateral 02/13/2017   Procedure: CYSTOSCOPY WITH RETROGRADE PYELOGRAM;  Surgeon: BHollice Espy MD;  Location: [facility] ORS;  Service: Urology;  Laterality: Bilateral;  . CYSTOSCOPY  W/ RETROGRADES Bilateral 09/19/2017   Procedure: CYSTOSCOPY WITH RETROGRADE PYELOGRAM;  Surgeon: Hollice Espy, MD;  Location: [facility];  Service: Urology;  Laterality: Bilateral;  . CYSTOSCOPY WITH BIOPSY  03/26/2017   Procedure: CYSTOSCOPY WITH BIOPSY;  Surgeon: Hollice Espy, MD;  Location: [facility];  Service: Urology;;  . Consuela Mimes WITH STENT PLACEMENT Bilateral 02/13/2017   Procedure: CYSTOSCOPY WITH STENT PLACEMENT;  Surgeon: Hollice Espy, MD;  Location: [facility];  Service: Urology;  Laterality: Bilateral;  . CYSTOSCOPY WITH STENT PLACEMENT  Bilateral 03/26/2017   Procedure: CYSTOSCOPY WITH STENT EXCHANGE;  Surgeon: Hollice Espy, MD;  Location: [facility]S;  Service: Urology;  Laterality: Bilateral;  . ESOPHAGOGASTRODUODENOSCOPY (EGD) WITH PROPOFOL N/A 02/13/2017   Procedure: ESOPHAGOGASTRODUODENOSCOPY (EGD) WITH PROPOFOL;  Surgeon: [name], MD;  [facility];  Service: Endoscopy;  Laterality: N/A;  . PORTA CATH INSERTION N/A 02/28/2017   Procedure: PORTA CATH INSERTION;  Surgeon: Algernon Huxley, MD;  Location: [place] CV LAB;  Service: Cardiovascular;  Laterality: N/A;  . PORTA CATH REMOVAL N/A 12/24/2017   Procedure: PORTA CATH REMOVAL;  Surgeon: Algernon Huxley, MD;  Location: [place] CV LAB;  Service: Cardiovascular;  Laterality: N/A;  . SALPINGOOPHORECTOMY    . TONSILECTOMY/ADENOIDECTOMY WITH MYRINGOTOMY    . TONSILLECTOMY    . TRANSURETHRAL RESECTION OF BLADDER TUMOR N/A 02/13/2017   Procedure: TRANSURETHRAL RESECTION OF BLADDER TUMOR (TURBT);  Surgeon: Hollice Espy, MD;  Location: [facility] ORS;  Service: Urology;  Laterality: N/A;  . TUBAL LIGATION    . UPPER GI ENDOSCOPY  02/13/2017  . URETEROSCOPY Left 09/19/2017   Procedure: URETEROSCOPY;  Surgeon: Hollice Espy, MD;  Location: [facility] ORS;  Service: Urology;  Laterality: Left;  Marland Kitchen VISCERAL ARTERY INTERVENTION N/A 02/12/2017   Procedure: VISCERAL ARTERY INTERVENTION;  Surgeon: Algernon Huxley, MD;  Location: [place] CV LAB;  Service: Cardiovascular;  Laterality: N/A;   Family History  Problem Relation Age of Onset  . Cirrhosis Father        died age 68  . Alcohol abuse Father   . Asthma Mother   . Congestive Heart Failure Mother   . Breast cancer Mother        2 (1/2 sisters)  . Osteoarthritis Mother   . Colon cancer Mother   . Lupus Sister   . Alcohol abuse Sister   . Ovarian cancer Sister   . Osteoporosis Sister   . Skin cancer Sister   . Breast cancer Cousin   . Breast cancer Maternal Aunt    Social History   Socioeconomic History   . Marital status: Widowed    Spouse name: Not on file  . Number of children: 2  . Years of education: Not on file  . Highest education level: Not on file  Occupational History  . Not on file  Social Needs  . Financial resource strain: Not hard at all  . Food insecurity:    Worry: Never true    Inability: Never true  . Transportation needs:    Medical: No    Non-medical: No  Tobacco Use  . Smoking status: Former Smoker    Packs/day: 1.00    Years: 45.00    Pack years: 45.00    Types: Cigarettes    Last attempt to quit: 11/11/2003    Years since quitting: 14.9  . Smokeless tobacco: Never Used  Substance and Sexual Activity  . Alcohol use: No    Alcohol/week: 0.0 standard drinks    Comment: occasional  .  Drug use: No  . Sexual activity: Never  Lifestyle  . Physical activity:    Days per week: Not on file    Minutes per session: Not on file  . Stress: Not at all  Relationships  . Social connections:    Talks on phone: Not on file    Gets together: Not on file    Attends religious service: Not on file    Active member of club or organization: Not on file    Attends meetings of clubs or organizations: Not on file    Relationship status: Not on file  Other Topics Concern  . Not on file  Social History Narrative  . Not on file    Outpatient Encounter Medications as of 10/17/2018  Medication Sig  . albuterol (PROVENTIL HFA;VENTOLIN HFA) 108 (90 Base) MCG/ACT inhaler Inhale 2 puffs into the lungs every 6 (six) hours as needed for wheezing or shortness of breath.  Marland Kitchen amLODipine (NORVASC) 5 MG tablet TAKE 1 TABLET BY MOUTH TWO  TIMES DAILY  . cholecalciferol (VITAMIN D) 1000 units tablet Take 1,000 Units by mouth daily.  . clopidogrel (PLAVIX) 75 MG tablet Take 1 tablet (75 mg total) by mouth daily.  . fluticasone (FLONASE) 50 MCG/ACT nasal spray USE 2 SPRAYS INTO BOTH  NOSTRILS DAILY AS NEEDED  FOR ALLERGIES.  . hydrALAZINE (APRESOLINE) 25 MG tablet Take 1 tablet (25 mg  total) by mouth 3 (three) times daily.  Marland Kitchen ipratropium-albuterol (DUONEB) 0.5-2.5 (3) MG/3ML SOLN Take 3 mLs by nebulization every 6 (six) hours as needed (for shortness of breath).  . losartan (COZAAR) 100 MG tablet TAKE 1 TABLET BY MOUTH ONCE A DAY  . lovastatin (MEVACOR) 40 MG tablet TAKE 1 TABLET BY MOUTH  DAILY IN THE EVENING  . metoprolol tartrate (LOPRESSOR) 25 MG tablet TAKE 1 TABLET BY MOUTH  TWICE A DAY  . OVER THE COUNTER MEDICATION daily.  . potassium chloride (K-DUR,KLOR-CON) 10 MEQ tablet TAKE 1 TABLET BY MOUTH  DAILY  . SYMBICORT 160-4.5 MCG/ACT inhaler USE 2 PUFFS TWO TIMES DAILY  . torsemide (DEMADEX) 20 MG tablet Take 20 mg by mouth every morning.   Facility-Administered Encounter Medications as of 10/17/2018  Medication  . heparin lock flush 100 unit/mL  . sodium chloride flush (NS) 0.9 % injection 10 mL    Activities of Daily Living In your present state of health, do you have any difficulty performing the following activities: 10/17/2018 03/22/2018  Hearing? N N  Vision? N N  Difficulty concentrating or making decisions? N N  Walking or climbing stairs? Y N  Comment Leg weakness -  Dressing or bathing? N N  Doing errands, shopping? N N  Preparing Food and eating ? N -  Using the Toilet? N -  In the past six months, have you accidently leaked urine? N -  Do you have problems with loss of bowel control? N -  Managing your Medications? N -  Managing your Finances? N -  Housekeeping or managing your Housekeeping? N -  Some recent data might be hidden    Patient Care Team: Einar Pheasant, MD as PCP - General (Internal Medicine) Clent Jacks, RN as Registered Nurse Sindy Guadeloupe, MD as Consulting Physician (Oncology)    Assessment:   This is a routine wellness examination for [place].  I connected with patient 10/17/18 at  8:30 AM EDT by a video/audio enabled telemedicine application and verified that I am speaking with the correct person using two  identifiers. Patient stated full name and DOB. Patient gave permission to continue with virtual visit. Patient's location was at home and Nurse's location was at [place] office.   Health Screenings  Mammogram - 02/2018 Colonoscopy - 02/2017 Bone Density - 03/2015 Glaucoma -none Hearing -demonstrates normal hearing during visit. Hemoglobin A1C - 04/2018 Cholesterol - 04/2018 Dental- UTD Vision- UTD  Social  Alcohol intake - no     Smoking history- former Smokers in home? none Illicit drug use? none Exercise - chair/bed stretches every other day for about  Diet - no salt Sexually Active -no BMI- discussed the importance of a healthy diet, water intake and the benefits of aerobic exercise.  Educational material provided.   Safety  Patient feels safe at home- yes. Son resides with her.  Patient does have smoke detectors at home- yes Patient does wear sunscreen or protective clothing when in direct sunlight -yes Patient does wear seat belt when in a moving vehicle -yes Patient drives- yes  [id number] precautions and sickness symptoms discussed.   Activities of Daily Living Patient denies needing assistance with: driving, household chores, feeding themselves, getting from bed to chair, getting to the toilet, bathing/showering, dressing, managing money, or preparing meals.  No new identified risk were noted.    Depression Screen Patient denies losing interest in daily life, feeling hopeless, or crying easily over simple problems.   Medication-taking as directed and without issues.   Fall Screen Patient has fallen twice in the last year without injury. She has leg weakness and it becomes difficult to climb stairs.   Memory Screen Patient is alert.  Patient denies difficulty focusing, concentrating or misplacing items. Correctly identified the president of the [place], season and recall. Patient likes to complete puzzles for brain stimulation.  Immunizations The following  Immunizations were discussed: Influenza, shingles, pneumonia, and tetanus.   Other Providers Patient Care Team: Einar Pheasant, MD as PCP - General (Internal Medicine) Clent Jacks, RN as Registered Nurse Sindy Guadeloupe, MD as Consulting Physician (Oncology)  Exercise Activities and Dietary recommendations Current Exercise Habits: Home exercise routine, Type of exercise: stretching(Chair/bed exercises), Time (Minutes): 10, Frequency (Times/Week): 2, Weekly Exercise (Minutes/Week): 20, Intensity: Mild  Goals      Patient Stated   . Increase physical activity (pt-stated)     Resume line dancing        Fall Risk Fall Risk  10/17/2018 03/22/2018 02/25/2018 12/21/2017 11/16/2017  Falls in the past year? 1 0 No No No  Comment - - - - -  Number falls in past yr: 1 0 - - -  Injury with Fall? - 0 - - -   Depression Screen PHQ 2/9 Scores 10/17/2018 02/25/2018 12/21/2017 11/16/2017  PHQ - 2 Score 0 0 0 0     Cognitive Function     6CIT Screen 10/17/2018 10/15/2017 03/29/2016  What Year? 0 points 0 points 0 points  What month? 0 points 0 points 0 points  What time? 0 points 0 points 0 points  Count back from 20 0 points 0 points 0 points  Months in reverse 0 points 0 points 0 points  Repeat phrase 0 points 0 points -  Total Score 0 0 -    Immunization History  Administered Date(s) Administered  . Influenza Inj Mdck Quad Pf 02/20/2017  . Influenza Split 02/03/2013, 01/16/2014  . Influenza, High Dose Seasonal PF 02/02/2016  . Influenza,inj,Quad PF,6+ Mos 01/19/2015  . Influenza-Unspecified 01/21/2009, 01/10/2010, 11/14/2010, 01/22/2012, 01/16/2013, 01/16/2014, 01/19/2015, 02/02/2016, 02/11/2018  .  Pneumococcal Conjugate-13 02/05/2014  . Pneumococcal Polysaccharide-23 03/29/2016  . Pneumococcal-Unspecified 06/15/1998, 04/17/2005  . Zoster 08/06/2014   Screening Tests Health Maintenance  Topic Date Due  . TETANUS/TDAP  12/06/1953  . MAMMOGRAM  09/07/2018  . INFLUENZA VACCINE   12/14/2018  . COLONOSCOPY  02/13/2022  . DEXA SCAN  Completed  . PNA vac Low Risk Adult  Completed       Plan:    End of life planning; Advance aging; Advanced directives discussed.  Copy of current HCPOA/Living Will on file.    I have personally reviewed and noted the following in the patient's chart:   . Medical and social history . Use of alcohol, tobacco or illicit drugs  . Current medications and supplements . Functional ability and status . Nutritional status . Physical activity . Advanced directives . List of other physicians . Hospitalizations, surgeries, and ER visits in previous 12 months . Vitals . Screenings to include cognitive, depression, and falls . Referrals and appointments  In addition, I have reviewed and discussed with patient certain preventive protocols, quality metrics, and best practice recommendations. A written personalized care plan for preventive services as well as general preventive health recommendations were provided to patient.     Varney Biles, LPN  09/18/8976   Reviewed above information.  Agree with assessment and plan.  See my note regarding further evaluation and treatment for her weakness, etc.    Dr Nicki Reaper

## 2018-10-18 ENCOUNTER — Other Ambulatory Visit: Payer: Self-pay | Admitting: *Deleted

## 2018-10-18 ENCOUNTER — Inpatient Hospital Stay: Payer: Medicare Other | Attending: Oncology

## 2018-10-18 ENCOUNTER — Telehealth: Payer: Self-pay | Admitting: Internal Medicine

## 2018-10-18 ENCOUNTER — Other Ambulatory Visit: Payer: Self-pay

## 2018-10-18 ENCOUNTER — Ambulatory Visit
Admission: RE | Admit: 2018-10-18 | Discharge: 2018-10-18 | Disposition: A | Discharge status: Home / Self Care | Payer: Medicare Other | Source: Ambulatory Visit | Attending: Oncology | Admitting: Oncology

## 2018-10-18 DIAGNOSIS — Z7951 Long term (current) use of inhaled steroids: Secondary | ICD-10-CM | POA: Insufficient documentation

## 2018-10-18 DIAGNOSIS — R8290 Unspecified abnormal findings in urine: Secondary | ICD-10-CM

## 2018-10-18 DIAGNOSIS — J449 Chronic obstructive pulmonary disease, unspecified: Secondary | ICD-10-CM | POA: Diagnosis not present

## 2018-10-18 DIAGNOSIS — Z79899 Other long term (current) drug therapy: Secondary | ICD-10-CM | POA: Insufficient documentation

## 2018-10-18 DIAGNOSIS — R0602 Shortness of breath: Secondary | ICD-10-CM | POA: Insufficient documentation

## 2018-10-18 DIAGNOSIS — D751 Secondary polycythemia: Secondary | ICD-10-CM | POA: Insufficient documentation

## 2018-10-18 DIAGNOSIS — Z923 Personal history of irradiation: Secondary | ICD-10-CM | POA: Diagnosis not present

## 2018-10-18 DIAGNOSIS — Z8551 Personal history of malignant neoplasm of bladder: Secondary | ICD-10-CM | POA: Insufficient documentation

## 2018-10-18 DIAGNOSIS — Z803 Family history of malignant neoplasm of breast: Secondary | ICD-10-CM | POA: Diagnosis not present

## 2018-10-18 DIAGNOSIS — R609 Edema, unspecified: Secondary | ICD-10-CM | POA: Diagnosis not present

## 2018-10-18 DIAGNOSIS — I110 Hypertensive heart disease with heart failure: Secondary | ICD-10-CM | POA: Insufficient documentation

## 2018-10-18 DIAGNOSIS — Z9221 Personal history of antineoplastic chemotherapy: Secondary | ICD-10-CM | POA: Diagnosis not present

## 2018-10-18 DIAGNOSIS — R739 Hyperglycemia, unspecified: Secondary | ICD-10-CM

## 2018-10-18 DIAGNOSIS — Z7902 Long term (current) use of antithrombotics/antiplatelets: Secondary | ICD-10-CM | POA: Insufficient documentation

## 2018-10-18 DIAGNOSIS — Z7982 Long term (current) use of aspirin: Secondary | ICD-10-CM | POA: Diagnosis not present

## 2018-10-18 DIAGNOSIS — C679 Malignant neoplasm of bladder, unspecified: Secondary | ICD-10-CM

## 2018-10-18 DIAGNOSIS — Z87891 Personal history of nicotine dependence: Secondary | ICD-10-CM | POA: Diagnosis not present

## 2018-10-18 DIAGNOSIS — R300 Dysuria: Secondary | ICD-10-CM

## 2018-10-18 DIAGNOSIS — I10 Essential (primary) hypertension: Secondary | ICD-10-CM

## 2018-10-18 DIAGNOSIS — Z8249 Family history of ischemic heart disease and other diseases of the circulatory system: Secondary | ICD-10-CM | POA: Insufficient documentation

## 2018-10-18 DIAGNOSIS — E785 Hyperlipidemia, unspecified: Secondary | ICD-10-CM

## 2018-10-18 DIAGNOSIS — Z85828 Personal history of other malignant neoplasm of skin: Secondary | ICD-10-CM | POA: Insufficient documentation

## 2018-10-18 LAB — URINALYSIS, COMPLETE (UACMP) WITH MICROSCOPIC
Bacteria, UA: NONE SEEN
Bilirubin Urine: NEGATIVE
Glucose, UA: NEGATIVE mg/dL
Hgb urine dipstick: NEGATIVE
Ketones, ur: NEGATIVE mg/dL
Leukocytes,Ua: NEGATIVE
Nitrite: NEGATIVE
Protein, ur: NEGATIVE mg/dL
Specific Gravity, Urine: 1.008 (ref 1.005–1.030)
pH: 6 (ref 5.0–8.0)

## 2018-10-18 LAB — CBC WITH DIFFERENTIAL/PLATELET
Abs Immature Granulocytes: 0.03 10*3/uL (ref 0.00–0.07)
Basophils Absolute: 0 10*3/uL (ref 0.0–0.1)
Basophils Relative: 0 %
Eosinophils Absolute: 0.2 10*3/uL (ref 0.0–0.5)
Eosinophils Relative: 3 %
HCT: 43 % (ref 36.0–46.0)
Hemoglobin: 14.7 g/dL (ref 12.0–15.0)
Immature Granulocytes: 0 %
Lymphocytes Relative: 15 %
Lymphs Abs: 1.3 10*3/uL (ref 0.7–4.0)
MCH: 31.8 pg (ref 26.0–34.0)
MCHC: 34.2 g/dL (ref 30.0–36.0)
MCV: 93.1 fL (ref 80.0–100.0)
Monocytes Absolute: 0.8 10*3/uL (ref 0.1–1.0)
Monocytes Relative: 10 %
Neutro Abs: 6.3 10*3/uL (ref 1.7–7.7)
Neutrophils Relative %: 72 %
Platelets: 166 10*3/uL (ref 150–400)
RBC: 4.62 MIL/uL (ref 3.87–5.11)
RDW: 13 % (ref 11.5–15.5)
WBC: 8.7 10*3/uL (ref 4.0–10.5)
nRBC: 0 % (ref 0.0–0.2)

## 2018-10-18 LAB — COMPREHENSIVE METABOLIC PANEL
ALT: 21 U/L (ref 0–44)
AST: 24 U/L (ref 15–41)
Albumin: 4.5 g/dL (ref 3.5–5.0)
Alkaline Phosphatase: 53 U/L (ref 38–126)
Anion gap: 11 (ref 5–15)
BUN: 23 mg/dL (ref 8–23)
CO2: 25 mmol/L (ref 22–32)
Calcium: 9.5 mg/dL (ref 8.9–10.3)
Chloride: 103 mmol/L (ref 98–111)
Creatinine, Ser: 1.14 mg/dL — ABNORMAL HIGH (ref 0.44–1.00)
GFR calc Af Amer: 52 mL/min — ABNORMAL LOW (ref >60)
GFR calc non Af Amer: 45 mL/min — ABNORMAL LOW (ref >60)
Glucose, Bld: 121 mg/dL — ABNORMAL HIGH (ref 70–99)
Potassium: 3.6 mmol/L (ref 3.5–5.1)
Sodium: 139 mmol/L (ref 135–145)
Total Bilirubin: 0.8 mg/dL (ref 0.3–1.2)
Total Protein: 7.3 g/dL (ref 6.5–8.1)

## 2018-10-18 LAB — LIPID PANEL
Cholesterol: 156 mg/dL (ref 0–200)
HDL: 65 mg/dL (ref >40)
LDL Cholesterol: 74 mg/dL (ref 0–99)
Total CHOL/HDL Ratio: 2.4 RATIO
Triglycerides: 85 mg/dL (ref <150)
VLDL: 17 mg/dL (ref 0–40)

## 2018-10-18 LAB — TSH: TSH: 3.349 u[IU]/mL (ref 0.350–4.500)

## 2018-10-18 MED ORDER — IOHEXOL 300 MG/ML  SOLN
75.0000 mL | Freq: Once | INTRAMUSCULAR | Status: AC | PRN
  Administered 2018-10-18: 75 mL via INTRAVENOUS

## 2018-10-18 NOTE — Progress Notes (Signed)
I sent her a inbasket with the labs and results attached to Dr. Nicki Reaper

## 2018-10-18 NOTE — Telephone Encounter (Signed)
Kristie from Dr [facility] called concerning add on labs I advised her the Urine and culture plus labs from Feb. I hope this was right?

## 2018-10-18 NOTE — Telephone Encounter (Signed)
Yes.  This is correct.  Tina [name] told me she tried to get in touch with them yesterday.

## 2018-10-19 LAB — URINE CULTURE: Culture: NO GROWTH

## 2018-10-19 LAB — HEMOGLOBIN A1C
Hgb A1c MFr Bld: 5.2 % (ref 4.8–5.6)
Mean Plasma Glucose: 103 mg/dL

## 2018-10-20 ENCOUNTER — Encounter: Payer: Self-pay | Admitting: Internal Medicine

## 2018-10-20 NOTE — Assessment & Plan Note (Signed)
Followed by nephrology.  Follow metabolic panel.

## 2018-10-20 NOTE — Assessment & Plan Note (Signed)
Followed by hematology 

## 2018-10-20 NOTE — Assessment & Plan Note (Signed)
Followed by cardiology.  Has noticed more increased sob with exertion.  Increased fatigue.  Decreased energy.  Discussed further w/up.  Will check routine labs and urine.  Also, have cardiology reevaluate to confirm no further cardiac w/up warranted.  Breathing stable.  Currently with no increased cough, congestion or wheezing.  Arrange PT.

## 2018-10-20 NOTE — Assessment & Plan Note (Signed)
Blood pressure stable.  Continue current medication regimen.  Follow pressures.  Follow metabolic panel.

## 2018-10-20 NOTE — Assessment & Plan Note (Signed)
On lovastatin.  Low cholesterol diet and exercise.  Follow lipid panel and liver function tests.   

## 2018-10-20 NOTE — Assessment & Plan Note (Signed)
Low carb diet and exercise.  Follow met b and a1c.   

## 2018-10-20 NOTE — Assessment & Plan Note (Signed)
Followed by pulmonary.  Continue inhalers.  Breathing stable.  No increased cough, congestion or wheezing.

## 2018-10-20 NOTE — Assessment & Plan Note (Signed)
Followed by urology and hematology.

## 2018-10-20 NOTE — Assessment & Plan Note (Signed)
With weakness and unsteadiness as outlined.  Will see if can arrange home health PT for evaluation and treatment.

## 2018-10-22 ENCOUNTER — Encounter: Payer: Self-pay | Admitting: Oncology

## 2018-10-22 ENCOUNTER — Other Ambulatory Visit: Payer: Self-pay

## 2018-10-22 ENCOUNTER — Inpatient Hospital Stay (HOSPITAL_BASED_OUTPATIENT_CLINIC_OR_DEPARTMENT_OTHER): Payer: Medicare Other | Admitting: Oncology

## 2018-10-22 VITALS — BP 175/62 | HR 69 | Temp 99.0°F | Resp 18 | Ht 65.0 in | Wt 139.6 lb

## 2018-10-22 DIAGNOSIS — C679 Malignant neoplasm of bladder, unspecified: Secondary | ICD-10-CM

## 2018-10-22 DIAGNOSIS — Z951 Presence of aortocoronary bypass graft: Secondary | ICD-10-CM | POA: Diagnosis not present

## 2018-10-22 DIAGNOSIS — J449 Chronic obstructive pulmonary disease, unspecified: Secondary | ICD-10-CM | POA: Diagnosis not present

## 2018-10-22 DIAGNOSIS — I2510 Atherosclerotic heart disease of native coronary artery without angina pectoris: Secondary | ICD-10-CM | POA: Diagnosis not present

## 2018-10-22 DIAGNOSIS — I10 Essential (primary) hypertension: Secondary | ICD-10-CM | POA: Diagnosis not present

## 2018-10-22 DIAGNOSIS — Z87891 Personal history of nicotine dependence: Secondary | ICD-10-CM | POA: Diagnosis not present

## 2018-10-22 DIAGNOSIS — Z85828 Personal history of other malignant neoplasm of skin: Secondary | ICD-10-CM

## 2018-10-22 DIAGNOSIS — Z7982 Long term (current) use of aspirin: Secondary | ICD-10-CM | POA: Diagnosis not present

## 2018-10-22 DIAGNOSIS — Z923 Personal history of irradiation: Secondary | ICD-10-CM

## 2018-10-22 DIAGNOSIS — Z7951 Long term (current) use of inhaled steroids: Secondary | ICD-10-CM | POA: Diagnosis not present

## 2018-10-22 DIAGNOSIS — Z7902 Long term (current) use of antithrombotics/antiplatelets: Secondary | ICD-10-CM | POA: Diagnosis not present

## 2018-10-22 DIAGNOSIS — Z9221 Personal history of antineoplastic chemotherapy: Secondary | ICD-10-CM

## 2018-10-22 DIAGNOSIS — I110 Hypertensive heart disease with heart failure: Secondary | ICD-10-CM

## 2018-10-22 DIAGNOSIS — Z79899 Other long term (current) drug therapy: Secondary | ICD-10-CM | POA: Diagnosis not present

## 2018-10-22 DIAGNOSIS — D649 Anemia, unspecified: Secondary | ICD-10-CM | POA: Diagnosis not present

## 2018-10-22 DIAGNOSIS — R531 Weakness: Secondary | ICD-10-CM | POA: Diagnosis not present

## 2018-10-22 DIAGNOSIS — R609 Edema, unspecified: Secondary | ICD-10-CM | POA: Diagnosis not present

## 2018-10-22 DIAGNOSIS — D751 Secondary polycythemia: Secondary | ICD-10-CM | POA: Diagnosis not present

## 2018-10-22 DIAGNOSIS — R0602 Shortness of breath: Secondary | ICD-10-CM | POA: Diagnosis not present

## 2018-10-22 DIAGNOSIS — Z8551 Personal history of malignant neoplasm of bladder: Secondary | ICD-10-CM

## 2018-10-22 DIAGNOSIS — I509 Heart failure, unspecified: Secondary | ICD-10-CM | POA: Diagnosis not present

## 2018-10-22 DIAGNOSIS — Z8249 Family history of ischemic heart disease and other diseases of the circulatory system: Secondary | ICD-10-CM

## 2018-10-22 DIAGNOSIS — Z803 Family history of malignant neoplasm of breast: Secondary | ICD-10-CM

## 2018-10-22 DIAGNOSIS — M810 Age-related osteoporosis without current pathological fracture: Secondary | ICD-10-CM | POA: Diagnosis not present

## 2018-10-22 DIAGNOSIS — N183 Chronic kidney disease, stage 3 (moderate): Secondary | ICD-10-CM | POA: Diagnosis not present

## 2018-10-22 NOTE — Progress Notes (Signed)
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 She has had jak 2 testing as well as EPO levels in the past which was unremarkable. She gets a phlebotomy when her hematocrit is more than 50. She was last seen by me in June 2018. She also has a history of lung nodules and was getting low-dose lung cancer screening scans on 07/04/2014 when her insurance stopped paying.   She presented to the hospital in October 2018 with symptoms of nausea and diarrhea weight loss and fatigue. She was noted to have irregular wall thickening in the ascending colon suspicious for colon cancer as well as 4.2 x 3.2 cm posterior bladder wall mass that was incidentally noted. She was also seen by vascular surgery and underwent abdominal angiogram with stent placement at the [facility]. She is on aspirin and Plavix for the same. Patient underwent colonoscopy for concerns of ischemic colitis which showed irregular thickening of the descending colon consistent with ischemic colitis. EGD showed a small hiatal hernia. Gastric ulcers were noted which was treated with argon photocoagulation.  With regards to the posterior bladder wall mass she was seen by Dr. Erlene Quan and underwent TURBT with bilateral ureteral stent placement on 02/13/2017. Pathology showed small cell carcinoma with  muscularis propria invasion. 4 cycles of carbo/etoposide completed on 12/261/8. She completed RT following that as she was not deemed to be a surgical candidate. She remains in remission  Incidentally found to have PE on scans done in jan 2019.  She completed 6 months of anticoagulation and stopped    Interval history-  Patient reports feeling short of breath with mild-to-moderate exertion.  She has had similar episodes in the past especially after she received radiation back in 2018.  She is also working with physical therapy to work on her balance.  She has not had any recent falls.  ECOG PS- 1 Pain scale- 0 Opioid associated constipation- no  Review of systems- Review of Systems  Constitutional: Negative for chills, fever, malaise/fatigue and weight loss.  HENT: Negative for congestion, ear discharge and nosebleeds.   Eyes: Negative for blurred vision.  Respiratory: Negative for cough, hemoptysis, sputum production, shortness of breath and wheezing.   Cardiovascular: Negative for chest pain, palpitations, orthopnea and claudication.  Gastrointestinal: Negative for abdominal pain, blood in stool, constipation, diarrhea, heartburn, melena, nausea and vomiting.  Genitourinary: Negative for dysuria, flank pain, frequency, hematuria and urgency.  Musculoskeletal: Negative for back pain, joint pain and myalgias.  Skin: Negative for rash.  Neurological: Negative for dizziness, tingling, focal weakness, seizures, weakness and headaches.  Endo/Heme/Allergies: Does not bruise/bleed easily.  Psychiatric/Behavioral: Negative for depression and suicidal ideas. The patient does not have insomnia.       Allergies  Allergen Reactions   Evista [Raloxifene] Other (See Comments)    Night sweats    Fluticasone-Salmeterol Other (See Comments)    Cough, "chokes me"  Past Medical History:  Diagnosis Date   Anemia    Asthma    Bladder cancer ([place])    BRCA positive    CAD (coronary  artery disease)    CHF (congestive heart failure) (HCC)    COPD (chronic obstructive pulmonary disease) (HCC)    Emphysema lung (HCC)    GERD (gastroesophageal reflux disease)    History of colon polyps    Hypercholesterolemia    Hyperglycemia    Hypertension    Lung nodules    Osteoporosis    Personal history of tobacco use, presenting hazards to health 11/25/2014   Polycythemia vera(238.4)    Renal cyst    Skin cancer      Past Surgical History:  Procedure Laterality Date   ANGIOPLASTY     coronary (x1)   COLONOSCOPY WITH PROPOFOL N/A 03/02/2015   Procedure: COLONOSCOPY WITH PROPOFOL;  Surgeon: Lollie Sails, MD;  Location[facility] ENDOSCOPY;  Service: Endoscopy;  Laterality: N/A;   COLONOSCOPY WITH PROPOFOL N/A 02/13/2017   Procedure: COLONOSCOPY WITH PROPOFOL;  Surgeon: [name], MD;  Location: [facility] ENDOSCOPY;  Service: Endoscopy;  Laterality: N/A;   CORONARY ANGIOPLASTY     CORONARY ARTERY BYPASS GRAFT  09/17/2017   pt denies   CYSTOSCOPY W/ RETROGRADES Bilateral 02/13/2017   Procedure: CYSTOSCOPY WITH RETROGRADE PYELOGRAM;  Surgeon: Hollice Espy, MD;  Location: [facility] ORS;  Service: Urology;  Laterality: Bilateral;   CYSTOSCOPY W/ RETROGRADES Bilateral 09/19/2017   Procedure: CYSTOSCOPY WITH RETROGRADE PYELOGRAM;  Surgeon: Hollice Espy, MD;  Location: [facility] ORS;  Service: Urology;  Laterality: Bilateral;   CYSTOSCOPY WITH BIOPSY  03/26/2017   Procedure: CYSTOSCOPY WITH BIOPSY;  Surgeon: Hollice Espy, MD;  Location: [facility] ORS;  Service: Urology;;   CYSTOSCOPY WITH STENT PLACEMENT Bilateral 02/13/2017   Procedure: CYSTOSCOPY WITH STENT PLACEMENT;  Surgeon: Hollice Espy, MD;  Location: [facility] ORS;  Service: Urology;  Laterality: Bilateral;   CYSTOSCOPY WITH STENT PLACEMENT Bilateral 03/26/2017   Procedure: CYSTOSCOPY WITH STENT EXCHANGE;  Surgeon: Hollice Espy, MD;  Location: [facility] ORS;  Service: Urology;  Laterality: Bilateral;    ESOPHAGOGASTRODUODENOSCOPY (EGD) WITH PROPOFOL N/A 02/13/2017   Procedure: ESOPHAGOGASTRODUODENOSCOPY (EGD) WITH PROPOFOL;  Surgeon: [name], MD;  Location: [facility] ENDOSCOPY;  Service: Endoscopy;  Laterality: N/A;   PORTA CATH INSERTION N/A 02/28/2017   Procedure: PORTA CATH INSERTION;  Surgeon: Algernon Huxley, MD;  Location: [facility];  Service: Cardiovascular;  Laterality: N/A;   PORTA CATH REMOVAL N/A 12/24/2017   Procedure: PORTA CATH REMOVAL;  Surgeon: Algernon Huxley, MD;  Location: [place] [facility];  Service: Cardiovascular;  Laterality: N/A;   SALPINGOOPHORECTOMY     TONSILECTOMY/ADENOIDECTOMY WITH MYRINGOTOMY     TONSILLECTOMY     TRANSURETHRAL RESECTION OF BLADDER TUMOR N/A 02/13/2017   Procedure: TRANSURETHRAL RESECTION OF BLADDER TUMOR (TURBT);  Surgeon: Hollice Espy, MD;  Location: [facility] ORS;  Service: Urology;  Laterality: N/A;   TUBAL LIGATION     UPPER GI ENDOSCOPY  02/13/2017   URETEROSCOPY Left 09/19/2017   Procedure: URETEROSCOPY;  Surgeon: Hollice Espy, MD;  Location: [facility] ORS;  Service: Urology;  Laterality: Left;   VISCERAL ARTERY INTERVENTION N/A 02/12/2017   Procedure: VISCERAL ARTERY INTERVENTION;  Surgeon: Algernon Huxley, MD;  Location: [place] [facility];  Service: Cardiovascular;  Laterality: N/A;    Social History   Socioeconomic History   Marital status: Widowed    Spouse name: Not on file   Number of children: 2   Years of education:  Not on file   Highest education level: Not on file  Occupational History   Not on file  Social Needs   Financial resource strain: Not hard at all   Food insecurity:    Worry: Never true    Inability: Never true   Transportation needs:    Medical: No    Non-medical: No  Tobacco Use   Smoking status: Former Smoker    Packs/day: 1.00    Years: 45.00    Pack years: 45.00    Types: Cigarettes    Last attempt to quit: 11/11/2003    Years since quitting: 14.9   Smokeless tobacco: Never  Used  Substance and Sexual Activity   Alcohol use: No    Alcohol/week: 0.0 standard drinks    Comment: occasional   Drug use: No   Sexual activity: Never  Lifestyle   Physical activity:    Days per week: Not on file    Minutes per session: Not on file   Stress: Not at all  Relationships   Social connections:    Talks on phone: Not on file    Gets together: Not on file    Attends religious service: Not on file    Active member of club or organization: Not on file    Attends meetings of clubs or organizations: Not on file    Relationship status: Not on file   Intimate partner violence:    Fear of current or ex partner: No    Emotionally abused: No    Physically abused: No    Forced sexual activity: No  Other Topics Concern   Not on file  Social History Narrative   Not on file    Family History  Problem Relation Age of Onset   Cirrhosis Father        died age 82   Alcohol abuse Father    Asthma Mother    Congestive Heart Failure Mother    Breast cancer Mother        2 (1/2 sisters)   Osteoarthritis Mother    Colon cancer Mother    Lupus Sister    Alcohol abuse Sister    Ovarian cancer Sister    Osteoporosis Sister    Skin cancer Sister    Breast cancer Cousin    Breast cancer Maternal Aunt      Current Outpatient Medications:    albuterol (PROVENTIL HFA;VENTOLIN HFA) 108 (90 Base) MCG/ACT inhaler, Inhale 2 puffs into the lungs every 6 (six) hours as needed for wheezing or shortness of breath., Disp: 1 Inhaler, Rfl: 2   amLODipine (NORVASC) 5 MG tablet, TAKE 1 TABLET BY MOUTH TWO  TIMES DAILY, Disp: 180 tablet, Rfl: 1   cholecalciferol (VITAMIN D) 1000 units tablet, Take 1,000 Units by mouth daily., Disp: , Rfl:    clopidogrel (PLAVIX) 75 MG tablet, Take 1 tablet (75 mg total) by mouth daily., Disp: 30 tablet, Rfl: 0   fluticasone (FLONASE) 50 MCG/ACT nasal spray, USE 2 SPRAYS INTO BOTH  NOSTRILS DAILY AS NEEDED  FOR ALLERGIES., Disp:  48 g, Rfl: 1   hydrALAZINE (APRESOLINE) 25 MG tablet, Take 1 tablet (25 mg total) by mouth 3 (three) times daily., Disp: 90 tablet, Rfl: 2   ipratropium-albuterol (DUONEB) 0.5-2.5 (3) MG/3ML SOLN, Take 3 mLs by nebulization every 6 (six) hours as needed (for shortness of breath)., Disp: 36 mL, Rfl: 0   losartan (COZAAR) 100 MG tablet, TAKE 1 TABLET BY MOUTH ONCE A DAY, Disp: 90 tablet,  Rfl: 1   lovastatin (MEVACOR) 40 MG tablet, TAKE 1 TABLET BY MOUTH  DAILY IN THE EVENING, Disp: 90 tablet, Rfl: 1   metoprolol tartrate (LOPRESSOR) 25 MG tablet, TAKE 1 TABLET BY MOUTH  TWICE A DAY, Disp: 180 tablet, Rfl: 1   OVER THE COUNTER MEDICATION, daily., Disp: , Rfl:    potassium chloride (K-DUR,KLOR-CON) 10 MEQ tablet, TAKE 1 TABLET BY MOUTH  DAILY, Disp: 90 tablet, Rfl: 1   SYMBICORT 160-4.5 MCG/ACT inhaler, USE 2 PUFFS TWO TIMES DAILY, Disp: 30.6 g, Rfl: 3   torsemide (DEMADEX) 20 MG tablet, Take 20 mg by mouth every morning., Disp: , Rfl: 11 No current facility-administered medications for this visit.   Facility-Administered Medications Ordered in Other Visits:    heparin lock flush 100 unit/mL, 500 Units, Intravenous, Once, Sindy Guadeloupe, MD   sodium chloride flush (NS) 0.9 % injection 10 mL, 10 mL, Intravenous, PRN, Sindy Guadeloupe, MD  Physical exam:  Vitals:   10/22/18 1527  BP: (!) 175/62  Pulse: 69  Resp: 18  Temp: 99 F (37.2 C)  TempSrc: Tympanic  SpO2: 93%  Weight: 139 lb 9.6 oz (63.3 kg)  Height: '5\' 5"'  (1.651 m)   Physical Exam HENT:     Head: Normocephalic and atraumatic.  Eyes:     Pupils: Pupils are equal, round, and reactive to light.  Neck:     Musculoskeletal: Normal range of motion.  Cardiovascular:     Rate and Rhythm: Normal rate and regular rhythm.     Heart sounds: Normal heart sounds.  Pulmonary:     Effort: Pulmonary effort is normal.     Breath sounds: Normal breath sounds.  Abdominal:     General: Bowel sounds are normal.     Palpations:  Abdomen is soft.  Musculoskeletal:     Comments: Bilateral lower extremity edema  Skin:    General: Skin is warm and dry.  Neurological:     Mental Status: She is alert and oriented to person, place, and time.      CMP Latest Ref Rng & Units 10/18/2018  Glucose 70 - 99 mg/dL 121(H)  BUN 8 - 23 mg/dL 23  Creatinine 0.44 - 1.00 mg/dL 1.14(H)  Sodium 135 - 145 mmol/L 139  Potassium 3.5 - 5.1 mmol/L 3.6  Chloride 98 - 111 mmol/L 103  CO2 22 - 32 mmol/L 25  Calcium 8.9 - 10.3 mg/dL 9.5  Total Protein 6.5 - 8.1 g/dL 7.3  Total Bilirubin 0.3 - 1.2 mg/dL 0.8  Alkaline Phos 38 - 126 U/L 53  AST 15 - 41 U/L 24  ALT 0 - 44 U/L 21   CBC Latest Ref Rng & Units 10/18/2018  WBC 4.0 - 10.5 K/uL 8.7  Hemoglobin 12.0 - 15.0 g/dL 14.7  Hematocrit 36.0 - 46.0 % 43.0  Platelets 150 - 400 K/uL 166    No images are attached to the encounter.  Ct Chest W Contrast  Result Date: 10/18/2018 CLINICAL DATA:  Small cell cancer of the bladder. Status post chemo radiation therapy. EXAM: CT CHEST, ABDOMEN, AND PELVIS WITH CONTRAST TECHNIQUE: Multidetector CT imaging of the chest, abdomen and pelvis was performed following the standard protocol during bolus administration of intravenous contrast. CONTRAST:  19m OMNIPAQUE IOHEXOL 300 MG/ML  SOLN COMPARISON:  CT 04/15/2018 FINDINGS: CT CHEST FINDINGS Cardiovascular: Abdominal aorta is normal caliber with atherosclerotic calcification. There is no retroperitoneal or periportal lymphadenopathy. No pelvic lymphadenopathy. Mediastinum/Nodes: No axillary supraclavicular adenopathy. No mediastinal hilar adenopathy.  No pericardial effusion. Precarinal lymph node measuring 11 mm unchanged. Lungs/Pleura: No suspicious pulmonary nodules.  Airways normal. Musculoskeletal: No aggressive osseous lesion. CT ABDOMEN AND PELVIS FINDINGS Hepatobiliary: No focal hepatic lesion. No biliary ductal dilatation. Gallbladder is normal. Common bile duct is normal. Pancreas: Pancreas is normal.  No ductal dilatation. No pancreatic inflammation. Spleen: Normal spleen Adrenals/urinary tract: Bilateral simple fluid attenuation renal lesions. Ureters normal. No gross abnormality in bladder. delayed imaging through the kidneys and proximal ureters unremarkable. Stomach/Bowel: Stomach, small bowel, appendix, and cecum are normal. The colon and rectosigmoid colon are normal. Vascular/Lymphatic: Abdominal aorta is normal caliber with atherosclerotic calcification. There is no retroperitoneal or periportal lymphadenopathy. No pelvic lymphadenopathy. Reproductive: Uterus and necks are normal. Other: No peritoneal metastasis Musculoskeletal: No aggressive osseous lesion IMPRESSION: Chest Impression: 1. No evidence of thoracic metastasis. Abdomen / Pelvis Impression: 1. No evidence of metastatic disease in the abdomen pelvis. 2. Normal bladder. 3.  Aortic Atherosclerosis (ICD10-I70.0). Electronically Signed   By: Suzy Bouchard M.D.   On: 10/18/2018 17:04   Ct Abdomen Pelvis W Contrast  Result Date: 10/18/2018 CLINICAL DATA:  Small cell cancer of the bladder. Status post chemo radiation therapy. EXAM: CT CHEST, ABDOMEN, AND PELVIS WITH CONTRAST TECHNIQUE: Multidetector CT imaging of the chest, abdomen and pelvis was performed following the standard protocol during bolus administration of intravenous contrast. CONTRAST:  77m OMNIPAQUE IOHEXOL 300 MG/ML  SOLN COMPARISON:  CT 04/15/2018 FINDINGS: CT CHEST FINDINGS Cardiovascular: Abdominal aorta is normal caliber with atherosclerotic calcification. There is no retroperitoneal or periportal lymphadenopathy. No pelvic lymphadenopathy. Mediastinum/Nodes: No axillary supraclavicular adenopathy. No mediastinal hilar adenopathy. No pericardial effusion. Precarinal lymph node measuring 11 mm unchanged. Lungs/Pleura: No suspicious pulmonary nodules.  Airways normal. Musculoskeletal: No aggressive osseous lesion. CT ABDOMEN AND PELVIS FINDINGS Hepatobiliary: No focal hepatic  lesion. No biliary ductal dilatation. Gallbladder is normal. Common bile duct is normal. Pancreas: Pancreas is normal. No ductal dilatation. No pancreatic inflammation. Spleen: Normal spleen Adrenals/urinary tract: Bilateral simple fluid attenuation renal lesions. Ureters normal. No gross abnormality in bladder. delayed imaging through the kidneys and proximal ureters unremarkable. Stomach/Bowel: Stomach, small bowel, appendix, and cecum are normal. The colon and rectosigmoid colon are normal. Vascular/Lymphatic: Abdominal aorta is normal caliber with atherosclerotic calcification. There is no retroperitoneal or periportal lymphadenopathy. No pelvic lymphadenopathy. Reproductive: Uterus and necks are normal. Other: No peritoneal metastasis Musculoskeletal: No aggressive osseous lesion IMPRESSION: Chest Impression: 1. No evidence of thoracic metastasis. Abdomen / Pelvis Impression: 1. No evidence of metastatic disease in the abdomen pelvis. 2. Normal bladder. 3.  Aortic Atherosclerosis (ICD10-I70.0). Electronically Signed   By: SSuzy BouchardM.D.   On: 10/18/2018 17:04     Assessment and plan- Patient is a 84y.o. 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 she is currently in remission and here to discuss the results of CT scans  I have reviewed CT chest abdomen and pelvis images with the patient and discussed findings in detail which does not show any evidence of recurrent disease or metastatic disease.  Patient last completed carboplatin and etoposide chemotherapy back in 2018 and has been in remission since then.  I will continue getting scans every 6 months at this time.  I will see her back in 6 months with CBC, CMP and CT chest abdomen and pelvis with contrast prior  Shortness of breath: She will be following up with cardiology and her primary care doctor for this  Visit Diagnosis 1.  Small cell carcinoma of bladder ([place])      Dr. Randa Evens, MD,  [facility] at [facility] [phone] 10/22/2018 3:14 PM

## 2018-10-22 NOTE — Progress Notes (Signed)
Pt states that she feels bad. Her swelling is worse and her legs are weak. She is sob when I brought her back to check in for md. She says she walked from parking lot to inside cancer center . She came up the elevator and walked over to the door. She wa out in lobby for 3 min.before I got her in and she was still sob. Her sats 93. She states she is eating and drinking good.  Her bowels are ok but they do not work every day.

## 2018-10-23 ENCOUNTER — Telehealth: Payer: Self-pay | Admitting: Internal Medicine

## 2018-10-23 NOTE — Telephone Encounter (Unsigned)
Copied from [place] [id number]. Topic: Quick Communication - Home Health Verbal Orders >> Oct 23, 2018  3:08 PM Celene Kras A wrote: Caller/Agency: [facility]/ [facility] home [facility] Callback Number: [phone] Requesting OT/PT/Skilled Nursing/Social Work/Speech Therapy: PT Frequency: 2x for 2wks , 1x for 2wks

## 2018-10-23 NOTE — Telephone Encounter (Signed)
Verbals given  

## 2018-10-25 DIAGNOSIS — I10 Essential (primary) hypertension: Secondary | ICD-10-CM | POA: Diagnosis not present

## 2018-10-25 DIAGNOSIS — I2510 Atherosclerotic heart disease of native coronary artery without angina pectoris: Secondary | ICD-10-CM | POA: Diagnosis not present

## 2018-10-25 DIAGNOSIS — D649 Anemia, unspecified: Secondary | ICD-10-CM | POA: Diagnosis not present

## 2018-10-25 DIAGNOSIS — I509 Heart failure, unspecified: Secondary | ICD-10-CM | POA: Diagnosis not present

## 2018-10-25 DIAGNOSIS — R531 Weakness: Secondary | ICD-10-CM | POA: Diagnosis not present

## 2018-10-25 DIAGNOSIS — I110 Hypertensive heart disease with heart failure: Secondary | ICD-10-CM | POA: Diagnosis not present

## 2018-10-29 DIAGNOSIS — I110 Hypertensive heart disease with heart failure: Secondary | ICD-10-CM | POA: Diagnosis not present

## 2018-10-29 DIAGNOSIS — D649 Anemia, unspecified: Secondary | ICD-10-CM | POA: Diagnosis not present

## 2018-10-29 DIAGNOSIS — R531 Weakness: Secondary | ICD-10-CM | POA: Diagnosis not present

## 2018-10-29 DIAGNOSIS — I10 Essential (primary) hypertension: Secondary | ICD-10-CM | POA: Diagnosis not present

## 2018-10-29 DIAGNOSIS — I2510 Atherosclerotic heart disease of native coronary artery without angina pectoris: Secondary | ICD-10-CM | POA: Diagnosis not present

## 2018-10-29 DIAGNOSIS — I509 Heart failure, unspecified: Secondary | ICD-10-CM | POA: Diagnosis not present

## 2018-10-30 ENCOUNTER — Other Ambulatory Visit: Payer: Self-pay | Admitting: Internal Medicine

## 2018-10-31 DIAGNOSIS — R600 Localized edema: Secondary | ICD-10-CM | POA: Diagnosis not present

## 2018-10-31 DIAGNOSIS — J449 Chronic obstructive pulmonary disease, unspecified: Secondary | ICD-10-CM | POA: Diagnosis not present

## 2018-10-31 DIAGNOSIS — R5383 Other fatigue: Secondary | ICD-10-CM | POA: Diagnosis not present

## 2018-10-31 DIAGNOSIS — R0600 Dyspnea, unspecified: Secondary | ICD-10-CM | POA: Diagnosis not present

## 2018-11-01 DIAGNOSIS — R531 Weakness: Secondary | ICD-10-CM | POA: Diagnosis not present

## 2018-11-01 DIAGNOSIS — I10 Essential (primary) hypertension: Secondary | ICD-10-CM | POA: Diagnosis not present

## 2018-11-01 DIAGNOSIS — I110 Hypertensive heart disease with heart failure: Secondary | ICD-10-CM | POA: Diagnosis not present

## 2018-11-01 DIAGNOSIS — I509 Heart failure, unspecified: Secondary | ICD-10-CM | POA: Diagnosis not present

## 2018-11-01 DIAGNOSIS — I2510 Atherosclerotic heart disease of native coronary artery without angina pectoris: Secondary | ICD-10-CM | POA: Diagnosis not present

## 2018-11-01 DIAGNOSIS — D649 Anemia, unspecified: Secondary | ICD-10-CM | POA: Diagnosis not present

## 2018-11-04 DIAGNOSIS — I509 Heart failure, unspecified: Secondary | ICD-10-CM | POA: Diagnosis not present

## 2018-11-04 DIAGNOSIS — I2510 Atherosclerotic heart disease of native coronary artery without angina pectoris: Secondary | ICD-10-CM | POA: Diagnosis not present

## 2018-11-04 DIAGNOSIS — I10 Essential (primary) hypertension: Secondary | ICD-10-CM | POA: Diagnosis not present

## 2018-11-04 DIAGNOSIS — I110 Hypertensive heart disease with heart failure: Secondary | ICD-10-CM | POA: Diagnosis not present

## 2018-11-04 DIAGNOSIS — R531 Weakness: Secondary | ICD-10-CM | POA: Diagnosis not present

## 2018-11-04 DIAGNOSIS — D649 Anemia, unspecified: Secondary | ICD-10-CM | POA: Diagnosis not present

## 2018-11-12 DIAGNOSIS — I110 Hypertensive heart disease with heart failure: Secondary | ICD-10-CM | POA: Diagnosis not present

## 2018-11-12 DIAGNOSIS — D649 Anemia, unspecified: Secondary | ICD-10-CM | POA: Diagnosis not present

## 2018-11-12 DIAGNOSIS — I509 Heart failure, unspecified: Secondary | ICD-10-CM | POA: Diagnosis not present

## 2018-11-12 DIAGNOSIS — I2510 Atherosclerotic heart disease of native coronary artery without angina pectoris: Secondary | ICD-10-CM | POA: Diagnosis not present

## 2018-11-12 DIAGNOSIS — R531 Weakness: Secondary | ICD-10-CM | POA: Diagnosis not present

## 2018-11-12 DIAGNOSIS — I10 Essential (primary) hypertension: Secondary | ICD-10-CM | POA: Diagnosis not present

## 2018-11-13 DIAGNOSIS — I10 Essential (primary) hypertension: Secondary | ICD-10-CM | POA: Diagnosis not present

## 2018-11-13 DIAGNOSIS — D649 Anemia, unspecified: Secondary | ICD-10-CM | POA: Diagnosis not present

## 2018-11-13 DIAGNOSIS — I2510 Atherosclerotic heart disease of native coronary artery without angina pectoris: Secondary | ICD-10-CM | POA: Diagnosis not present

## 2018-11-13 DIAGNOSIS — I509 Heart failure, unspecified: Secondary | ICD-10-CM | POA: Diagnosis not present

## 2018-11-13 DIAGNOSIS — R531 Weakness: Secondary | ICD-10-CM | POA: Diagnosis not present

## 2018-11-13 DIAGNOSIS — I110 Hypertensive heart disease with heart failure: Secondary | ICD-10-CM | POA: Diagnosis not present

## 2018-12-06 ENCOUNTER — Other Ambulatory Visit: Payer: Self-pay

## 2018-12-06 DIAGNOSIS — W2209XA Striking against other stationary object, initial encounter: Secondary | ICD-10-CM | POA: Diagnosis not present

## 2018-12-06 DIAGNOSIS — S81811A Laceration without foreign body, right lower leg, initial encounter: Secondary | ICD-10-CM | POA: Insufficient documentation

## 2018-12-06 DIAGNOSIS — I509 Heart failure, unspecified: Secondary | ICD-10-CM | POA: Diagnosis not present

## 2018-12-06 DIAGNOSIS — Z23 Encounter for immunization: Secondary | ICD-10-CM | POA: Diagnosis not present

## 2018-12-06 DIAGNOSIS — Y92008 Other place in unspecified non-institutional (private) residence as the place of occurrence of the external cause: Secondary | ICD-10-CM | POA: Insufficient documentation

## 2018-12-06 DIAGNOSIS — H1033 Unspecified acute conjunctivitis, bilateral: Secondary | ICD-10-CM | POA: Insufficient documentation

## 2018-12-06 DIAGNOSIS — N183 Chronic kidney disease, stage 3 (moderate): Secondary | ICD-10-CM | POA: Insufficient documentation

## 2018-12-06 DIAGNOSIS — Y9389 Activity, other specified: Secondary | ICD-10-CM | POA: Insufficient documentation

## 2018-12-06 DIAGNOSIS — B9689 Other specified bacterial agents as the cause of diseases classified elsewhere: Secondary | ICD-10-CM | POA: Diagnosis not present

## 2018-12-06 DIAGNOSIS — I130 Hypertensive heart and chronic kidney disease with heart failure and stage 1 through stage 4 chronic kidney disease, or unspecified chronic kidney disease: Secondary | ICD-10-CM | POA: Insufficient documentation

## 2018-12-06 DIAGNOSIS — Z79899 Other long term (current) drug therapy: Secondary | ICD-10-CM | POA: Insufficient documentation

## 2018-12-06 DIAGNOSIS — J449 Chronic obstructive pulmonary disease, unspecified: Secondary | ICD-10-CM | POA: Insufficient documentation

## 2018-12-06 DIAGNOSIS — Y999 Unspecified external cause status: Secondary | ICD-10-CM | POA: Diagnosis not present

## 2018-12-06 DIAGNOSIS — Z87891 Personal history of nicotine dependence: Secondary | ICD-10-CM | POA: Diagnosis not present

## 2018-12-06 NOTE — ED Triage Notes (Signed)
Pt states she hit right lower leg on a cement step this afternoon. Pt is on plavix. Pt with skin tear, abrasion, laceration noted to right lower leg with dressing in place.

## 2018-12-06 NOTE — ED Notes (Signed)
Pt has bled through dressing, dressing replaced.

## 2018-12-07 ENCOUNTER — Emergency Department
Admission: EM | Admit: 2018-12-07 | Discharge: 2018-12-07 | Disposition: A | Discharge status: Home / Self Care | Payer: Medicare Other | Attending: Emergency Medicine | Admitting: Emergency Medicine

## 2018-12-07 DIAGNOSIS — S81811A Laceration without foreign body, right lower leg, initial encounter: Secondary | ICD-10-CM | POA: Diagnosis not present

## 2018-12-07 DIAGNOSIS — H1033 Unspecified acute conjunctivitis, bilateral: Secondary | ICD-10-CM

## 2018-12-07 MED ORDER — ACETAMINOPHEN 500 MG PO TABS
1000.0000 mg | ORAL_TABLET | Freq: Once | ORAL | Status: AC
  Administered 2018-12-07: 1000 mg via ORAL
  Filled 2018-12-07: qty 2

## 2018-12-07 MED ORDER — ERYTHROMYCIN 5 MG/GM OP OINT
TOPICAL_OINTMENT | Freq: Once | OPHTHALMIC | Status: AC
  Administered 2018-12-07: 1 via OPHTHALMIC
  Filled 2018-12-07: qty 1

## 2018-12-07 MED ORDER — TETANUS-DIPHTH-ACELL PERTUSSIS 5-2.5-18.5 LF-MCG/0.5 IM SUSP
0.5000 mL | Freq: Once | INTRAMUSCULAR | Status: AC
  Administered 2018-12-07: 01:00:00 0.5 mL via INTRAMUSCULAR
  Filled 2018-12-07: qty 0.5

## 2018-12-07 NOTE — ED Notes (Signed)
Pt to the er for injuries sustained at home. Pt has weakness to the right leg. Pt normally leads with her left leg to climb steps so her right leg wont give out. Pt lead with her right leg and caught her shin on the brick step. Pt has a large skin tear across the anterior right leg extending from the medial to lateral side. Clots now present. Bruising extending up the leg due to blood thinner.

## 2018-12-07 NOTE — ED Provider Notes (Signed)
[facility] Emergency Department Provider Note  ____________________________________________  Time seen: Approximately 1:32 AM  I have reviewed the triage vital signs and the nursing notes.   HISTORY  Chief Complaint Laceration   HPI Tina [name] is a 84 y.o. female with history as listed below who presents for evaluation of a skin tear to the right lower extremity.  Patient reports that she had a very exciting day today since states her birthday.  She arrived back home from her birthday dinner and was climbing up the steps when she scraped her leg onto a brick step.  She denies any trauma to the leg.  She noted a large skin tear across the anterior aspect of it.  She reports that she was bleeding pretty significantly from it which made her call 911.  She denies any fall, no head trauma or LOC.  She is complaining of mild pain at the site of the skin tear which is burning in quality.  She is able to ambulate and bear weight.  Does not remember when her last tetanus shot was.   Past Medical History:  Diagnosis Date   Anemia    Asthma    Bladder cancer ([place])    BRCA positive    CAD (coronary artery disease)    CHF (congestive heart failure) (HCC)    COPD (chronic obstructive pulmonary disease) (HCC)    Emphysema lung (HCC)    GERD (gastroesophageal reflux disease)    History of colon polyps    Hypercholesterolemia    Hyperglycemia    Hypertension    Lung nodules    Osteoporosis    Personal history of tobacco use, presenting hazards to health 11/25/2014   Polycythemia vera(238.4)    Renal cyst    Skin cancer     Patient Active Problem List   Diagnosis Date Noted   Hoarseness 06/23/2018   Unsteadiness 05/11/2018   CHF (congestive heart failure) ([place]) 10/20/2017   Pulmonary embolus ([place]) 06/28/2017   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   Goals of care, counseling/discussion 03/02/2017   Abnormal computed  tomography of gastrointestinal tract    Noninfectious diarrhea    Ulceration of intestine    Hernia, hiatal    Bladder mass    Mesenteric ischemia due to arterial insufficiency (HCC)    Polycythemia vera ([place]) 10/21/2016   Elevated troponin 06/14/2016   CKD (chronic kidney disease), stage III ([place]) 06/14/2016   Leukocytosis 06/14/2016   Abdominal bruit 04/16/2016   Abnormal mammogram 06/06/2015   Essential hypertension 02/16/2015   Numbness 01/24/2015   Personal history of tobacco use, presenting hazards to health 11/25/2014   BRCA positive    COPD exacerbation ([place]) 11/14/2014   Sensation of pressure in bladder area 08/15/2014   Health care maintenance 08/15/2014   Presence of coronary angioplasty implant and graft 09/30/2013   History of colonic polyps 08/25/2012   Female genuine stress incontinence 06/28/2012   Infection of urinary tract 06/28/2012   CAD (coronary artery disease) 05/23/2012   COPD (chronic obstructive pulmonary disease) ([place]) 05/23/2012   Hypercholesterolemia 05/23/2012   Hyperglycemia 05/23/2012   Polycythemia, secondary 05/23/2012   Renal cyst 05/23/2012   Osteoporosis 05/23/2012    Past Surgical History:  Procedure Laterality Date   ANGIOPLASTY     coronary (x1)   COLONOSCOPY WITH PROPOFOL N/A 03/02/2015   Procedure: COLONOSCOPY WITH PROPOFOL;  Surgeon: Lollie Sails, MD;  Location: [facility] ENDOSCOPY;  Service: Endoscopy;  Laterality: N/A;  COLONOSCOPY WITH PROPOFOL N/A 02/13/2017   Procedure: COLONOSCOPY WITH PROPOFOL;  Surgeon: [name], MD;  Location[facility] ENDOSCOPY;  Service: Endoscopy;  Laterality: N/A;   CORONARY ANGIOPLASTY     CORONARY ARTERY BYPASS GRAFT  09/17/2017   pt denies   CYSTOSCOPY W/ RETROGRADES Bilateral 02/13/2017   Procedure: CYSTOSCOPY WITH RETROGRADE PYELOGRAM;  Surgeon: Hollice Espy, MD;  Location: [facility] ORS;  Service: Urology;  Laterality: Bilateral;   CYSTOSCOPY W/ RETROGRADES  Bilateral 09/19/2017   Procedure: CYSTOSCOPY WITH RETROGRADE PYELOGRAM;  Surgeon: Hollice Espy, MD;  Location: [facility] ORS;  Service: Urology;  Laterality: Bilateral;   CYSTOSCOPY WITH BIOPSY  03/26/2017   Procedure: CYSTOSCOPY WITH BIOPSY;  Surgeon: Hollice Espy, MD;  Location: [facility] ORS;  Service: Urology;;   CYSTOSCOPY WITH STENT PLACEMENT Bilateral 02/13/2017   Procedure: CYSTOSCOPY WITH STENT PLACEMENT;  Surgeon: Hollice Espy, MD;  Location: [facility] ORS;  Service: Urology;  Laterality: Bilateral;   CYSTOSCOPY WITH STENT PLACEMENT Bilateral 03/26/2017   Procedure: CYSTOSCOPY WITH STENT EXCHANGE;  Surgeon: Hollice Espy, MD;  Location: [facility] ORS;  Service: Urology;  Laterality: Bilateral;   ESOPHAGOGASTRODUODENOSCOPY (EGD) WITH PROPOFOL N/A 02/13/2017   Procedure: ESOPHAGOGASTRODUODENOSCOPY (EGD) WITH PROPOFOL;  Surgeon: [name], MD;  Location: [facility] ENDOSCOPY;  Service: Endoscopy;  Laterality: N/A;   PORTA CATH INSERTION N/A 02/28/2017   Procedure: PORTA CATH INSERTION;  Surgeon: Algernon Huxley, MD;  Location: [place] CV LAB;  Service: Cardiovascular;  Laterality: N/A;   PORTA CATH REMOVAL N/A 12/24/2017   Procedure: PORTA CATH REMOVAL;  Surgeon: Algernon Huxley, MD;  Location: [place] CV LAB;  Service: Cardiovascular;  Laterality: N/A;   SALPINGOOPHORECTOMY     TONSILECTOMY/ADENOIDECTOMY WITH MYRINGOTOMY     TONSILLECTOMY     TRANSURETHRAL RESECTION OF BLADDER TUMOR N/A 02/13/2017   Procedure: TRANSURETHRAL RESECTION OF BLADDER TUMOR (TURBT);  Surgeon: Hollice Espy, MD;  Location: [facility] ORS;  Service: Urology;  Laterality: N/A;   TUBAL LIGATION     UPPER GI ENDOSCOPY  02/13/2017   URETEROSCOPY Left 09/19/2017   Procedure: URETEROSCOPY;  Surgeon: Hollice Espy, MD;  Location: [facility] ORS;  Service: Urology;  Laterality: Left;   VISCERAL ARTERY INTERVENTION N/A 02/12/2017   Procedure: VISCERAL ARTERY INTERVENTION;  Surgeon: Algernon Huxley, MD;  Location: [place] CV  LAB;  Service: Cardiovascular;  Laterality: N/A;    Prior to Admission medications   Medication Sig Start Date End Date Taking? Authorizing Provider  albuterol (PROVENTIL HFA;VENTOLIN HFA) 108 (90 Base) MCG/ACT inhaler Inhale 2 puffs into the lungs every 6 (six) hours as needed for wheezing or shortness of breath. 10/22/17   Bettey Costa, MD  amLODipine (NORVASC) 5 MG tablet TAKE 1 TABLET BY MOUTH TWO  TIMES DAILY 09/27/18   Einar Pheasant, MD  cholecalciferol (VITAMIN D) 1000 units tablet Take 1,000 Units by mouth daily.    [provider]  clopidogrel (PLAVIX) 75 MG tablet Take 1 tablet (75 mg total) by mouth daily. 03/29/18   Einar Pheasant, MD  fluticasone (FLONASE) 50 MCG/ACT nasal spray USE 2 SPRAYS INTO BOTH  NOSTRILS DAILY AS NEEDED  FOR ALLERGIES. 10/01/18   Einar Pheasant, MD  hydrALAZINE (APRESOLINE) 25 MG tablet Take 1 tablet (25 mg total) by mouth 3 (three) times daily. 09/23/18   Einar Pheasant, MD  ipratropium-albuterol (DUONEB) 0.5-2.5 (3) MG/3ML SOLN Take 3 mLs by nebulization every 6 (six) hours as needed (for shortness of breath). 11/14/14   Joanne Gavel, MD  losartan (COZAAR) 100 MG tablet TAKE 1 TABLET  BY MOUTH ONCE A DAY 09/27/18   Einar Pheasant, MD  lovastatin (MEVACOR) 40 MG tablet TAKE 1 TABLET BY MOUTH  DAILY IN THE EVENING 10/30/18   Einar Pheasant, MD  metoprolol tartrate (LOPRESSOR) 25 MG tablet TAKE 1 TABLET BY MOUTH  TWICE A DAY 10/01/18   Einar Pheasant, MD  OVER THE COUNTER MEDICATION daily.    [provider]  potassium chloride (K-DUR) 10 MEQ tablet TAKE 1 TABLET BY MOUTH  DAILY 10/30/18   Einar Pheasant, MD  SYMBICORT 160-4.5 MCG/ACT inhaler USE 2 PUFFS TWO TIMES DAILY 02/06/18   Einar Pheasant, MD  torsemide (DEMADEX) 20 MG tablet Take 20 mg by mouth every morning. 12/12/17   [provider]    Allergies Evista [raloxifene] and Fluticasone-salmeterol  Family History  Problem Relation Age of Onset   Cirrhosis Father        died  age 65   Alcohol abuse Father    Asthma Mother    Congestive Heart Failure Mother    Breast cancer Mother        2 (1/2 sisters)   Osteoarthritis Mother    Colon cancer Mother    Lupus Sister    Alcohol abuse Sister    Ovarian cancer Sister    Osteoporosis Sister    Skin cancer Sister    Breast cancer Cousin    Breast cancer Maternal Aunt     Social History Social History   Tobacco Use   Smoking status: Former Smoker    Packs/day: 1.00    Years: 45.00    Pack years: 45.00    Types: Cigarettes    Quit date: 11/11/2003    Years since quitting: 15.0   Smokeless tobacco: Never Used  Substance Use Topics   Alcohol use: No    Alcohol/week: 0.0 standard drinks    Comment: occasional   Drug use: No    Review of Systems  Constitutional: Negative for fever. Eyes: Negative for visual changes. ENT: Negative for sore throat. Neck: No neck pain  Cardiovascular: Negative for chest pain. Respiratory: Negative for shortness of breath. Gastrointestinal: Negative for abdominal pain, vomiting or diarrhea. Genitourinary: Negative for dysuria. Musculoskeletal: Negative for back pain. Skin: Negative for rash. + skin tear Neurological: Negative for headaches, weakness or numbness. Psych: No SI or HI  ____________________________________________   PHYSICAL EXAM:  VITAL SIGNS: ED Triage Vitals  Enc Vitals Group     BP 12/06/18 2022 139/65     Pulse Rate 12/06/18 2022 72     Resp 12/06/18 2022 16     Temp 12/06/18 2022 98 F (36.7 C)     Temp Source 12/06/18 2022 Oral     SpO2 12/06/18 2022 98 %     Weight 12/06/18 2023 135 lb (61.2 kg)     Height 12/06/18 2023 '5\' 4"'  (1.626 m)     Head Circumference --      Peak Flow --      Pain Score 12/06/18 2023 0     Pain Loc --      Pain Edu? --      Excl. in [place]? --     Constitutional: Alert and oriented. Well appearing and in no apparent distress. HEENT:      Head: Normocephalic and atraumatic.         Eyes:  Conjunctivae are normal. Sclera is non-icteric.  Bilateral crusty white discharge      Mouth/Throat: Mucous membranes are moist.       Neck:  Supple with no signs of meningismus. Cardiovascular: Regular rate and rhythm. No murmurs, gallops, or rubs. 2+ symmetrical distal pulses are present in all extremities. No JVD. Respiratory: Normal respiratory effort. Lungs are clear to auscultation bilaterally. No wheezes, crackles, or rhonchi.  Gastrointestinal: Soft, non tender, and non distended with positive bowel sounds. No rebound or guarding. Musculoskeletal: Nontender with normal range of motion in all extremities.  Neurologic: Normal speech and language. Face is symmetric. Moving all extremities. No gross focal neurologic deficits are appreciated. Skin: Skin is warm, dry and intact.  Large skin tear located in the anterior shin of the right leg, not actively bleeding Psychiatric: Mood and affect are normal. Speech and behavior are normal.  ____________________________________________   LABS (all labs ordered are listed, but only abnormal results are displayed)  Labs Reviewed - No data to display ____________________________________________  EKG  none  ____________________________________________  RADIOLOGY  none  ____________________________________________   PROCEDURES  Procedure(s) performed: None Procedures Critical Care performed:  None ____________________________________________   INITIAL IMPRESSION / ASSESSMENT AND PLAN / ED COURSE  84 y.o. female with history as listed below who presents for evaluation of a skin tear to the right lower extremity.  Patient with a large skin tear to the anterior shin area.  Wound was thoroughly washed.  Patient had jeans overhead and therefore wound did not look contaminated.  Skin present and was used to cover the wound and Steri-Strips were placed to hold it in place.  Wound was dressed.  Discussed wound care with the patient to follow-up  with PCP for close monitoring.  Patient with no bony deformities and no trauma to the leg other than scraping therefore no x-rays were done.  Tetanus was updated.  Patient also noted to have crusty discharge from both eyes.  She is complaining of 24 hours of feeling itchy on both of her eyes.  Concerning for conjunctivitis.  She was started on erythromycin.  She does not use contact lenses.  Tylenol for pain and follow-up with PCP was discussed.       As part of my medical decision making, I reviewed the following data within the [facility] notes reviewed and incorporated, Old chart reviewed, Notes from prior ED visits and [place] Controlled Substance Database   Patient was evaluated in Emergency Department today for the symptoms described in the history of present illness. Patient was evaluated in the context of the global COVID-19 pandemic, which necessitated consideration that the patient might be at risk for infection with the SARS-CoV-2 virus that causes COVID-19. Institutional protocols and algorithms that pertain to the evaluation of patients at risk for COVID-19 are in a state of rapid change based on information released by regulatory bodies including the CDC and federal and state organizations. These policies and algorithms were followed during the patient's care in the ED.   ____________________________________________   FINAL CLINICAL IMPRESSION(S) / ED DIAGNOSES   Final diagnoses:  Skin tear of right lower leg without complication, initial encounter  Acute bacterial conjunctivitis of both eyes      NEW MEDICATIONS STARTED DURING THIS VISIT:  ED Discharge Orders    None       Note:  This document was prepared using Dragon voice recognition software and may include unintentional dictation errors.    Alfred Levins, [place], MD 12/07/18 [phone]

## 2018-12-07 NOTE — Discharge Instructions (Signed)
Keep the dressing on for 24 to 48 hours.  Keep the wound dry and clean.  Protect with dressing and change it daily.  Follow-up with your doctor Monday.  Return to the emergency room for redness of the skin, fever, or pus.  Use the eyedrops as prescribed.

## 2018-12-10 ENCOUNTER — Telehealth: Payer: Self-pay | Admitting: Internal Medicine

## 2018-12-10 NOTE — Telephone Encounter (Signed)
Tried to reach patient again this afternoon second message left to please call office.

## 2018-12-10 NOTE — Telephone Encounter (Signed)
Called patient to screen for in office visit, left message for patient  To return call to office.Marland Kitchen

## 2018-12-11 ENCOUNTER — Encounter: Payer: Self-pay | Admitting: Internal Medicine

## 2018-12-11 ENCOUNTER — Other Ambulatory Visit: Payer: Self-pay

## 2018-12-11 ENCOUNTER — Encounter: Payer: Medicare Other | Attending: Internal Medicine | Admitting: Internal Medicine

## 2018-12-11 ENCOUNTER — Ambulatory Visit (INDEPENDENT_AMBULATORY_CARE_PROVIDER_SITE_OTHER): Payer: Medicare Other | Admitting: Internal Medicine

## 2018-12-11 DIAGNOSIS — Z1231 Encounter for screening mammogram for malignant neoplasm of breast: Secondary | ICD-10-CM | POA: Diagnosis not present

## 2018-12-11 DIAGNOSIS — I2510 Atherosclerotic heart disease of native coronary artery without angina pectoris: Secondary | ICD-10-CM | POA: Diagnosis not present

## 2018-12-11 DIAGNOSIS — Z923 Personal history of irradiation: Secondary | ICD-10-CM | POA: Insufficient documentation

## 2018-12-11 DIAGNOSIS — R739 Hyperglycemia, unspecified: Secondary | ICD-10-CM | POA: Diagnosis not present

## 2018-12-11 DIAGNOSIS — N183 Chronic kidney disease, stage 3 unspecified: Secondary | ICD-10-CM

## 2018-12-11 DIAGNOSIS — Z8551 Personal history of malignant neoplasm of bladder: Secondary | ICD-10-CM | POA: Diagnosis not present

## 2018-12-11 DIAGNOSIS — I509 Heart failure, unspecified: Secondary | ICD-10-CM | POA: Diagnosis not present

## 2018-12-11 DIAGNOSIS — Z85828 Personal history of other malignant neoplasm of skin: Secondary | ICD-10-CM | POA: Diagnosis not present

## 2018-12-11 DIAGNOSIS — Z87891 Personal history of nicotine dependence: Secondary | ICD-10-CM | POA: Diagnosis not present

## 2018-12-11 DIAGNOSIS — J449 Chronic obstructive pulmonary disease, unspecified: Secondary | ICD-10-CM | POA: Insufficient documentation

## 2018-12-11 DIAGNOSIS — E7800 Pure hypercholesterolemia, unspecified: Secondary | ICD-10-CM

## 2018-12-11 DIAGNOSIS — I87331 Chronic venous hypertension (idiopathic) with ulcer and inflammation of right lower extremity: Secondary | ICD-10-CM | POA: Insufficient documentation

## 2018-12-11 DIAGNOSIS — I5032 Chronic diastolic (congestive) heart failure: Secondary | ICD-10-CM

## 2018-12-11 DIAGNOSIS — C679 Malignant neoplasm of bladder, unspecified: Secondary | ICD-10-CM | POA: Diagnosis not present

## 2018-12-11 DIAGNOSIS — S81811A Laceration without foreign body, right lower leg, initial encounter: Secondary | ICD-10-CM | POA: Diagnosis not present

## 2018-12-11 DIAGNOSIS — J410 Simple chronic bronchitis: Secondary | ICD-10-CM | POA: Diagnosis not present

## 2018-12-11 DIAGNOSIS — Z9221 Personal history of antineoplastic chemotherapy: Secondary | ICD-10-CM | POA: Diagnosis not present

## 2018-12-11 DIAGNOSIS — F419 Anxiety disorder, unspecified: Secondary | ICD-10-CM | POA: Diagnosis not present

## 2018-12-11 DIAGNOSIS — D45 Polycythemia vera: Secondary | ICD-10-CM | POA: Diagnosis not present

## 2018-12-11 DIAGNOSIS — I10 Essential (primary) hypertension: Secondary | ICD-10-CM

## 2018-12-11 DIAGNOSIS — I110 Hypertensive heart disease with heart failure: Secondary | ICD-10-CM | POA: Insufficient documentation

## 2018-12-11 DIAGNOSIS — L97812 Non-pressure chronic ulcer of other part of right lower leg with fat layer exposed: Secondary | ICD-10-CM | POA: Insufficient documentation

## 2018-12-11 DIAGNOSIS — I872 Venous insufficiency (chronic) (peripheral): Secondary | ICD-10-CM | POA: Diagnosis not present

## 2018-12-11 MED ORDER — HYDRALAZINE HCL 25 MG PO TABS: 25.0000 mg | ORAL_TABLET | Freq: Three times a day (TID) | ORAL | 2 refills | Status: DC

## 2018-12-11 NOTE — Progress Notes (Signed)
Tina, [name] ([id number]) Visit Report for 12/11/2018 Abuse/Suicide Risk Screen Details Patient Name: Tina [name], Tina [name]. Date of Service: 12/11/2018 1:15 PM Medical Record Number: [id number] Patient Account Number: [id number] Date of Birth/Sex: 1934-11-10 (84 y.o. F) Treating RN: Montey Hora Primary Care Maliq Pilley: Einar Pheasant Other Clinician: Referring Tarius Stangelo: Einar Pheasant Treating Cornelious Bartolucci/Extender: Tito Dine in Treatment: 0 Abuse/Suicide Risk Screen Items Answer ABUSE RISK SCREEN: Has anyone close to you tried to hurt or harm you recentlyo No Do you feel uncomfortable with anyone in your familyo No Has anyone forced you do things that you didnot want to doo No Electronic Signature(s) Signed: 12/11/2018 4:41:53 PM By: Montey Hora Entered By: Montey Hora on 12/11/2018 13:13:32 [name] ([id number]) -------------------------------------------------------------------------------- Activities of Daily Living Details Patient Name: Tina Leek T. Date of Service: 12/11/2018 1:15 PM Medical Record Number: [id number] Patient Account Number: [id number] Date of Birth/Sex: 1935-02-13 (84 y.o. F) Treating RN: Montey Hora Primary Care Brylei Pedley: Einar Pheasant Other Clinician: Referring Yumalay Circle: Einar Pheasant Treating Aneka Fagerstrom/Extender: Tito Dine in Treatment: 0 Activities of Daily Living Items Answer Activities of Daily Living (Please select one for each item) Drive Automobile Completely Able Take Medications Completely Able Use Telephone Completely Able Care for Appearance Completely Able Use Toilet Completely Able Bath / Shower Completely Able Dress Self Completely Able Feed Self Completely Able Walk Completely Able Get In / Out Bed Completely Able Housework Completely Able Prepare Meals Completely Able Handle Money Completely Able Shop for Self Completely Able Electronic Signature(s) Signed: 12/11/2018 4:41:53 PM By: Montey Hora Entered By: Montey Hora on 12/11/2018 13:13:53 [name] ([id number]) -------------------------------------------------------------------------------- Education Screening Details Patient Name: Tina Leek T. Date of Service: 12/11/2018 1:15 PM Medical Record Number: [id number] Patient Account Number: [id number] Date of Birth/Sex: 07/11/1934 (84 y.o. F) Treating RN: Montey Hora Primary Care [name]: Einar Pheasant Other Clinician: Referring [name]: Einar Pheasant Treating [name]/Extender: Tito Dine in Treatment: 0 Primary Learner Assessed: Patient Learning Preferences/Education Level/Primary Language Learning Preference: Explanation, Demonstration Highest Education Level: High School Preferred Language: English Cognitive Barrier Language Barrier: No Translator Needed: No Memory Deficit: No Emotional Barrier: No Cultural/Religious Beliefs Affecting Medical Care: No Physical Barrier Impaired Vision: No Impaired Hearing: No Decreased Hand dexterity: No Knowledge/Comprehension Knowledge Level: Medium Comprehension Level: Medium Ability to understand written Medium instructions: Ability to understand verbal Medium instructions: Motivation Anxiety Level: Calm Cooperation: Cooperative Education Importance: Acknowledges Need Interest in Health Problems: Asks Questions Perception: Coherent Willingness to Engage in Self- Medium Management Activities: Readiness to Engage in Self- Medium Management Activities: Electronic Signature(s) Signed: 12/11/2018 4:41:53 PM By: Montey Hora Entered By: Montey Hora on 12/11/2018 13:14:13 [name] ([id number]) -------------------------------------------------------------------------------- Fall Risk Assessment Details Patient Name: Tina Leek T. Date of Service: 12/11/2018 1:15 PM Medical Record Number: [id number] Patient Account Number: [id number] Date of Birth/Sex: 1935-02-02 (84 y.o.  F) Treating RN: Montey Hora Primary Care [name]: Einar Pheasant Other Clinician: Referring [name]: Einar Pheasant Treating [name]/Extender: Tito Dine in Treatment: 0 Fall Risk Assessment Items Have you had 2 or more falls in the last 12 monthso 0 No Have you had any fall that resulted in injury in the last 12 monthso 0 Yes FALLS RISK SCREEN History of falling - immediate or within 3 months 25 Yes Secondary diagnosis (Do you have 2 or more medical diagnoseso) 0 No Ambulatory aid None/bed rest/wheelchair/nurse 0 Yes Crutches/cane/walker 0 No Furniture 0 No Intravenous therapy Access/Saline/Heparin Lock 0 No Gait/Transferring Normal/ bed rest/ wheelchair 0 No Weak (  short steps with or without shuffle, stooped but able to lift head while 10 Yes walking, may seek support from furniture) Impaired (short steps with shuffle, may have difficulty arising from chair, head 0 No down, impaired balance) Mental Status Oriented to own ability 0 Yes Electronic Signature(s) Signed: 12/11/2018 4:41:53 PM By: Montey Hora Entered By: Montey Hora on 12/11/2018 13:14:36 [name] ([id number]) -------------------------------------------------------------------------------- Foot Assessment Details Patient Name: Tina Leek T. Date of Service: 12/11/2018 1:15 PM Medical Record Number: [id number] Patient Account Number: [id number] Date of Birth/Sex: Mar 27, 1935 (84 y.o. F) Treating RN: Montey Hora Primary Care [name]: Einar Pheasant Other Clinician: Referring [name]: Einar Pheasant Treating [name]/Extender: Tito Dine in Treatment: 0 Foot Assessment Items Site Locations + = Sensation present, - = Sensation absent, C = Callus, U = Ulcer R = Redness, W = Warmth, M = Maceration, PU = Pre-ulcerative lesion F = Fissure, S = Swelling, D = Dryness Assessment Right: Left: Other Deformity: No No Prior Foot Ulcer: No No Prior Amputation: No  No Charcot Joint: No No Ambulatory Status: Ambulatory Without Help Gait: Steady Electronic Signature(s) Signed: 12/11/2018 4:41:53 PM By: Montey Hora Entered By: Montey Hora on 12/11/2018 13:14:50 [name] ([id number]) -------------------------------------------------------------------------------- Nutrition Risk Screening Details Patient Name: Tina Leek T. Date of Service: 12/11/2018 1:15 PM Medical Record Number: [id number] Patient Account Number: [id number] Date of Birth/Sex: 1935/02/10 (84 y.o. F) Treating RN: Montey Hora Primary Care Milayna Rotenberg: Einar Pheasant Other Clinician: Referring Leon Goodnow: Einar Pheasant Treating Jekhi Bolin/Extender: Tito Dine in Treatment: 0 Height (in): 65 Weight (lbs): 138 Body Mass Index (BMI): 23 Nutrition Risk Screening Items Score Screening NUTRITION RISK SCREEN: I have an illness or condition that made me change the kind and/or amount of 0 No food I eat I eat fewer than two meals per day 0 No I eat few fruits and vegetables, or milk products 0 No I have three or more drinks of beer, liquor or wine almost every day 0 No I have tooth or mouth problems that make it hard for me to eat 0 No I don't always have enough money to buy the food I need 0 No I eat alone most of the time 0 No I take three or more different prescribed or over-the-counter drugs a day 1 Yes Without wanting to, I have lost or gained 10 pounds in the last six months 0 No I am not always physically able to shop, cook and/or feed myself 0 No Nutrition Protocols Good Risk Protocol 0 No interventions needed Moderate Risk Protocol High Risk Proctocol Risk Level: Good Risk Score: 1 Electronic Signature(s) Signed: 12/11/2018 4:41:53 PM By: Montey Hora Entered By: Montey Hora on 12/11/2018 13:14:43

## 2018-12-11 NOTE — Progress Notes (Signed)
[name], female   DOB: February 03, 1935, 84 y.o.   MRN: [id number]   Virtual Visit via telephone Note  This visit type was conducted due to national recommendations for restrictions regarding the COVID-19 pandemic (e.g. social distancing).  This format is felt to be most appropriate for this patient at this time.  All issues noted in this document were discussed and addressed.  No physical exam was performed (except for noted visual exam findings with Video Visits).   I connected with Tina [name] by telephone and verified that I am speaking with the correct person using two identifiers. Location patient: home Location provider: work  Persons participating in the telephone visit: patient, provider  I discussed the limitations, risks, security and privacy concerns of performing an evaluation and management service by telephone and the availability of in person appointments. The patient expressed understanding and agreed to proceed.   Reason for visit: scheduled follow up  HPI: Went to ER 12/06/18 - scraped her leg - brick step.  Skin reattached with steri strips and wrapped.  Has f/u planned with wound clinic today.  States swelling is better.  Able to walk without difficulty.  No other falls.  No chest pain.  Breathing stable.  No increased cough or congestion.  Does still report that she gives out with increased exertion.  Had discussed previously further cardiac w/up, secondary to feeling her breathing is at her baseline.  Using symbicort.  Sees Dr Raul Del.  Just evaluated by pulmonary 10/31/18.  Felt breathing stable.  Desires f/u with Dr Saralyn Pilar.  Seeing oncology for f/u bladder cancer.  Last saw Dr Janese Banks 10/22/18.  Stable.  Recommended f/u in 6 months.  Blood pressure remains in the 140s.  No significant spikes.  Eating.  No nausea or vomiting.  Bowels moving.     ROS: See pertinent positives and negatives per HPI.  Past Medical History:  Diagnosis Date  . Anemia   . Asthma   .  Bladder cancer ([place])   . BRCA positive   . CAD (coronary artery disease)   . CHF (congestive heart failure) ([place])   . COPD (chronic obstructive pulmonary disease) ([place])   . Emphysema lung ([place])   . GERD (gastroesophageal reflux disease)   . History of colon polyps   . Hypercholesterolemia   . Hyperglycemia   . Hypertension   . Lung nodules   . Osteoporosis   . Personal history of tobacco use, presenting hazards to health 11/25/2014  . Polycythemia vera(238.4)   . Renal cyst   . Skin cancer     Past Surgical History:  Procedure Laterality Date  . ANGIOPLASTY     coronary (x1)  . COLONOSCOPY WITH PROPOFOL N/A 03/02/2015   Procedure: COLONOSCOPY WITH PROPOFOL;  Surgeon: Lollie Sails, MD;  Location: [facility] ENDOSCOPY;  Service: Endoscopy;  Laterality: N/A;  . COLONOSCOPY WITH PROPOFOL N/A 02/13/2017   Procedure: COLONOSCOPY WITH PROPOFOL;  Surgeon: [name], MD;  Location: [facility] ENDOSCOPY;  Service: Endoscopy;  Laterality: N/A;  . CORONARY ANGIOPLASTY    . CORONARY ARTERY BYPASS GRAFT  09/17/2017   pt denies  . CYSTOSCOPY W/ RETROGRADES Bilateral 02/13/2017   Procedure: CYSTOSCOPY WITH RETROGRADE PYELOGRAM;  Surgeon: Hollice Espy, MD;  Location: [facility] ORS;  Service: Urology;  Laterality: Bilateral;  . CYSTOSCOPY W/ RETROGRADES Bilateral 09/19/2017   Procedure: CYSTOSCOPY WITH RETROGRADE PYELOGRAM;  Surgeon: Hollice Espy, MD;  Location: [facility] ORS;  Service: Urology;  Laterality: Bilateral;  . CYSTOSCOPY WITH BIOPSY  03/26/2017   Procedure: CYSTOSCOPY WITH BIOPSY;  Surgeon: Hollice Espy, MD;  Location: [facility] ORS;  Service: Urology;;  . Consuela Mimes WITH STENT PLACEMENT Bilateral 02/13/2017   Procedure: CYSTOSCOPY WITH STENT PLACEMENT;  Surgeon: Hollice Espy, MD;  Location: [facility] ORS;  Service: Urology;  Laterality: Bilateral;  . CYSTOSCOPY WITH STENT PLACEMENT Bilateral 03/26/2017   Procedure: CYSTOSCOPY WITH STENT EXCHANGE;  Surgeon: Hollice Espy, MD;  Location: [facility] ORS;   Service: Urology;  Laterality: Bilateral;  . ESOPHAGOGASTRODUODENOSCOPY (EGD) WITH PROPOFOL N/A 02/13/2017   Procedure: ESOPHAGOGASTRODUODENOSCOPY (EGD) WITH PROPOFOL;  Surgeon: [name], MD;  Location: [facility] ENDOSCOPY;  Service: Endoscopy;  Laterality: N/A;  . PORTA CATH INSERTION N/A 02/28/2017   Procedure: PORTA CATH INSERTION;  Surgeon: Algernon Huxley, MD;  Location: [place] CV LAB;  Service: Cardiovascular;  Laterality: N/A;  . PORTA CATH REMOVAL N/A 12/24/2017   Procedure: PORTA CATH REMOVAL;  Surgeon: Algernon Huxley, MD;  Location: [place] CV LAB;  Service: Cardiovascular;  Laterality: N/A;  . SALPINGOOPHORECTOMY    . TONSILECTOMY/ADENOIDECTOMY WITH MYRINGOTOMY    . TONSILLECTOMY    . TRANSURETHRAL RESECTION OF BLADDER TUMOR N/A 02/13/2017   Procedure: TRANSURETHRAL RESECTION OF BLADDER TUMOR (TURBT);  Surgeon: Hollice Espy, MD;  Location: [facility] ORS;  Service: Urology;  Laterality: N/A;  . TUBAL LIGATION    . UPPER GI ENDOSCOPY  02/13/2017  . URETEROSCOPY Left 09/19/2017   Procedure: URETEROSCOPY;  Surgeon: Hollice Espy, MD;  Location: [facility] ORS;  Service: Urology;  Laterality: Left;  Marland Kitchen VISCERAL ARTERY INTERVENTION N/A 02/12/2017   Procedure: VISCERAL ARTERY INTERVENTION;  Surgeon: Algernon Huxley, MD;  Location: [facility] LAB;  Service: Cardiovascular;  Laterality: N/A;    Family History  Problem Relation Age of Onset  . Cirrhosis Father        died age 5  . Alcohol abuse Father   . Asthma Mother   . Congestive Heart Failure Mother   . Breast cancer Mother        2 (1/2 sisters)  . Osteoarthritis Mother   . Colon cancer Mother   . Lupus Sister   . Alcohol abuse Sister   . Ovarian cancer Sister   . Osteoporosis Sister   . Skin cancer Sister   . Breast cancer Cousin   . Breast cancer Maternal Aunt     SOCIAL HX: reviewed.    Current Outpatient Medications:  .  albuterol (PROVENTIL HFA;VENTOLIN HFA) 108 (90 Base) MCG/ACT inhaler, Inhale 2 puffs into the  lungs every 6 (six) hours as needed for wheezing or shortness of breath., Disp: 1 Inhaler, Rfl: 2 .  amLODipine (NORVASC) 5 MG tablet, TAKE 1 TABLET BY MOUTH TWO  TIMES DAILY, Disp: 180 tablet, Rfl: 1 .  cholecalciferol (VITAMIN D) 1000 units tablet, Take 1,000 Units by mouth daily., Disp: , Rfl:  .  clopidogrel (PLAVIX) 75 MG tablet, Take 1 tablet (75 mg total) by mouth daily., Disp: 30 tablet, Rfl: 0 .  fluticasone (FLONASE) 50 MCG/ACT nasal spray, USE 2 SPRAYS INTO BOTH  NOSTRILS DAILY AS NEEDED  FOR ALLERGIES., Disp: 48 g, Rfl: 1 .  hydrALAZINE (APRESOLINE) 25 MG tablet, Take 1 tablet (25 mg total) by mouth 3 (three) times daily., Disp: 90 tablet, Rfl: 2 .  ipratropium-albuterol (DUONEB) 0.5-2.5 (3) MG/3ML SOLN, Take 3 mLs by nebulization every 6 (six) hours as needed (for shortness of breath)., Disp: 36 mL, Rfl: 0 .  losartan (COZAAR) 100 MG tablet, TAKE 1 TABLET BY MOUTH ONCE A  DAY, Disp: 90 tablet, Rfl: 1 .  lovastatin (MEVACOR) 40 MG tablet, TAKE 1 TABLET BY MOUTH  DAILY IN THE EVENING, Disp: 90 tablet, Rfl: 1 .  metoprolol tartrate (LOPRESSOR) 25 MG tablet, TAKE 1 TABLET BY MOUTH  TWICE A DAY, Disp: 180 tablet, Rfl: 1 .  OVER THE COUNTER MEDICATION, daily., Disp: , Rfl:  .  potassium chloride (K-DUR) 10 MEQ tablet, TAKE 1 TABLET BY MOUTH  DAILY, Disp: 90 tablet, Rfl: 1 .  SYMBICORT 160-4.5 MCG/ACT inhaler, USE 2 PUFFS TWO TIMES DAILY, Disp: 30.6 g, Rfl: 3 .  torsemide (DEMADEX) 20 MG tablet, Take 20 mg by mouth every morning., Disp: , Rfl: 11 No current facility-administered medications for this visit.   Facility-Administered Medications Ordered in Other Visits:  .  heparin lock flush 100 unit/mL, 500 Units, Intravenous, Once, Randa Evens C, MD .  sodium chloride flush (NS) 0.9 % injection 10 mL, 10 mL, Intravenous, PRN, Sindy Guadeloupe, MD  EXAM:  GENERAL: alert.  Sounds to be in no acute distress.  Answering questions appropriately.    PSYCH/NEURO: pleasant and cooperative, no  obvious depression or anxiety, speech and thought processing grossly intact  ASSESSMENT AND PLAN:  Discussed the following assessment and plan:  CAD (coronary artery disease) Followed by cardiology.  Has noticed more sob with exertion.  Increased fatigue.  Decreased energy.  Feels from pulmonary standpoint, she is doing better.  No increased cough or chest congestion.  Discussed further cardiac evaluation.  She desires to f/u with Dr Saralyn Pilar.    CHF (congestive heart failure) ([place]) Has done well on current regimen.  Continue f/u with cardiology.    CKD (chronic kidney disease), stage III ([place]) Followed by nephrology.  Follow metabolic apnel.   COPD (chronic obstructive pulmonary disease) Followed by Dr Raul Del.  Just evaluated 10/31/18.  Stable.  Continue inhalers.  No increased cough/congestion.    Essential hypertension Blood pressure as outlined.  Continue current medication.  Follow pressures.  Follow metabolic panel.    Hypercholesterolemia On lovastatin.  Low cholesterol diet and exercise.  Follow lipid panel and liver function tests.    Hyperglycemia Low carb diet and exercise.  Follow met b and a1c.    Polycythemia vera ([place]) Followed by hematology.    Small cell carcinoma of bladder ([place]) Followed by urology and oncology.      I discussed the assessment and treatment plan with the patient. The patient was provided an opportunity to ask questions and all were answered. The patient agreed with the plan and demonstrated an understanding of the instructions.   The patient was advised to call back or seek an in-person evaluation if the symptoms worsen or if the condition fails to improve as anticipated.  I provided 15 minutes of non-face-to-face time during this encounter.   Einar Pheasant, MD

## 2018-12-11 NOTE — Telephone Encounter (Signed)
LPN for Dr. Nicki Reaper was able to contact patient and telephone visit scheduled.

## 2018-12-12 NOTE — Progress Notes (Signed)
ROGELIO, WINBUSH ([id number]) Visit Report for 12/11/2018 Chief Complaint Document Details Patient Name: Tina [name], Tina [name]. Date of Service: 12/11/2018 1:15 PM Medical Record Number: [id number] Patient Account Number: [id number] Date of Birth/Sex: 09-04-1934 (84 y.o. F) Treating RN: Harold Barban Primary Care Provider: Einar Pheasant Other Clinician: Referring Provider: Einar Pheasant Treating Provider/Extender: Tito Dine in Treatment: 0 Information Obtained from: Patient Chief Complaint 12/11/2018; patient is here for review of a traumatic wound on the right anterior lower leg Electronic Signature(s) Signed: 12/11/2018 5:12:30 PM By: Linton Ham MD Entered By: Linton Ham on 12/11/2018 14:01:43 Tina [name] ([id number]) -------------------------------------------------------------------------------- HPI Details Patient Name: Tina Leek T. Date of Service: 12/11/2018 1:15 PM Medical Record Number: [id number] Patient Account Number: [id number] Date of Birth/Sex: 12-24-1934 (84 y.o. F) Treating RN: Harold Barban Primary Care Provider: Einar Pheasant Other Clinician: Referring Provider: Einar Pheasant Treating Provider/Extender: Tito Dine in Treatment: 0 History of Present Illness HPI Description: ADMISSION 12/11/2018 This is an 84 year old woman with multiple medical problems. She was in the ER on 7/25 after falling while going up brick stairs. She had a fairly substantial skin tear on her right anterior leg. The skin was reattached with Steri-Strips. She was told that it would "fall off". They put Curlex and Coban on this. She has had previous wounds on the left anterior leg that she was able to get the heel roughly 3 years ago. 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 Electronic Signature(s) Signed: 12/11/2018 5:12:30 PM By: Linton Ham MD Entered By: Linton Ham on 12/11/2018 14:05:29 Tina [name] ([id number]) -------------------------------------------------------------------------------- Physical Exam Details Patient Name: Tina [name]. Date of Service: 12/11/2018 1:15 PM Medical Record Number: [id number] Patient Account Number: [id number] Date of Birth/Sex: 1934/09/14 (84 y.o. F) Treating RN: Harold Barban Primary Care Provider: Einar Pheasant Other Clinician: Referring Provider: Einar Pheasant Treating Provider/Extender: Tito Dine in Treatment: 0 Constitutional Patient is hypertensive.. Pulse regular and within target range for patient.Marland Kitchen Respirations regular, non-labored and within target range.. Temperature is normal and within the target range for the patient.Marland Kitchen appears in no distress. Eyes Conjunctivae clear. No discharge. Respiratory Respiratory effort is easy and symmetric bilaterally. Rate is normal at rest and on room air.Marland Kitchen Poor air entry bilaterally.. Cardiovascular Heart rhythm and rate regular, without murmur or gallop. No evidence of CHF. Femoral pulses palpable. Pedal pulses are palpable. Nonpitting edema in the right greater than left leg. Hemosiderin staining. Lymphatic None palpable in the popliteal or inguinal area. Integumentary (Hair, Skin) Anxious and chronic venous insufficiency in both legs. Psychiatric Somewhat depressed and anxious. Notes Wound exam; the patient has a large oval-shaped area on the right anterior mid tibia. This has visible subcutaneous tissue around almost all of it. Our intake nurse remove the Steri-Strips there is leaking serosanguineous fluid coming out from under this. The skin over the surface is dark black and nonviable she has some subcutaneous fluid probably  hematoma. There is no clear infection around this. Electronic Signature(s) Signed: 12/11/2018 5:12:30 PM By: Linton Ham MD Entered By: Linton Ham on 12/11/2018 14:08:23 Tina [name] ([id number]) -------------------------------------------------------------------------------- Physician Orders Details Patient Name: Tina Leek T. Date of Service: 12/11/2018 1:15 PM Medical Record Number: [id number] Patient Account Number:  [id number] Date of Birth/Sex: 04-24-1935 (84 y.o. F) Treating RN: Harold Barban Primary Care Provider: Einar Pheasant Other Clinician: Referring Provider: Einar Pheasant Treating Provider/Extender: Tito Dine in Treatment: 0 Verbal / Phone Orders: No Diagnosis Coding Wound Cleansing Wound #1 Right,Anterior Lower Leg o May shower with protection. - Can use a cast protector. o No tub bath. Primary Wound Dressing Wound #1 Right,Anterior Lower Leg o Xeroform Secondary Dressing Wound #1 Right,Anterior Lower Leg o ABD pad Follow-up Appointments Wound #1 Right,Anterior Lower Leg o Return Appointment in 1 week. Edema Control Wound #1 Right,Anterior Lower Leg o Kerlix and Coban - Right Lower Extremity Off-Loading Wound #1 Right,Anterior Lower Leg o Other: - Elevate leg when sitting Electronic Signature(s) Signed: 12/11/2018 4:53:39 PM By: Harold Barban Signed: 12/11/2018 5:12:30 PM By: Linton Ham MD Entered By: Harold Barban on 12/11/2018 13:56:11 Tina [name] ([id number]) -------------------------------------------------------------------------------- Problem List Details Patient Name: Tina Leek T. Date of Service: 12/11/2018 1:15 PM Medical Record Number: [id number] Patient Account Number: [id number] Date of Birth/Sex: Feb 23, 1935 (84 y.o. F) Treating RN: Harold Barban Primary Care Provider: Einar Pheasant Other Clinician: Referring Provider: Einar Pheasant Treating Provider/Extender: Tito Dine in Treatment: 0 Active Problems ICD-10 Evaluated Encounter Code Description Active Date Today Diagnosis S80.11XD Contusion of right lower leg, subsequent encounter 12/11/2018 No Yes L97.818 Non-pressure chronic ulcer of other part of right lower leg 12/11/2018 No Yes with other specified severity I87.331 Chronic venous hypertension (idiopathic) with ulcer and 12/11/2018 No Yes inflammation of right lower extremity Inactive Problems Resolved Problems Electronic Signature(s) Signed: 12/11/2018 5:12:30 PM By: Linton Ham MD Entered By: Linton Ham on 12/11/2018 14:00:53 Tina [name] ([id number]) -------------------------------------------------------------------------------- Progress Note Details Patient Name: Tina Leek T. Date of Service: 12/11/2018 1:15 PM Medical Record Number: [id number] Patient Account Number: [id number] Date of Birth/Sex: 1934/12/30 (84 y.o. F) Treating RN: Harold Barban Primary Care Provider: Einar Pheasant Other Clinician: Referring Provider: Einar Pheasant Treating Provider/Extender: Tito Dine in Treatment: 0 Subjective Chief Complaint Information obtained from Patient 12/11/2018; patient is here for review of a traumatic wound on the right anterior lower leg History of Present Illness (HPI) ADMISSION 12/11/2018 This is an 84 year old woman with multiple medical problems. She was in the ER on 7/25 after falling while going up brick stairs. She had a fairly substantial skin tear on her right anterior leg. The skin was reattached with Steri-Strips. She was told that it would "fall off". They put Curlex and Coban on this. She has had previous wounds on the left anterior leg that she was able to get the heel roughly 3 years ago. 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 Patient History Information obtained from Patient. Allergies Evista, Advair Diskus Family History Cancer - Child,Mother,Siblings, Diabetes - Siblings, Lung Disease - Child, No family history of Heart Disease, Hereditary Spherocytosis, Hypertension, Kidney Disease, Seizures, Stroke, Thyroid Problems, Tuberculosis. Social History Former smoker - 15 years, Marital Status - Divorced, Alcohol Use - Never, Drug Use - No History, Caffeine Use - Daily. Medical History Hematologic/Lymphatic Patient has history of Anemia Denies history of Hemophilia, Human Immunodeficiency Virus, Lymphedema, Sickle Cell Disease Respiratory Patient has history of Chronic Obstructive Pulmonary Disease (COPD) - emphysema Denies history of Aspiration, Asthma,  Pneumothorax, Sleep Apnea, Tuberculosis Cardiovascular Patient has history of Congestive Heart Failure, Coronary Artery Disease, Hypertension Denies history of Angina, Arrhythmia, Deep Vein Thrombosis, Hypotension, Myocardial Infarction, Peripheral Arterial Disease, Peripheral Venous Disease, Phlebitis, Vasculitis Genitourinary Denies history of End Stage Renal Disease Tina [name], Tina [name]. ([id number]) Integumentary (Skin) Denies history of History of Burn, History of pressure wounds Musculoskeletal Denies history of Gout, Rheumatoid Arthritis, Osteoarthritis, Osteomyelitis Oncologic Patient has history of Received Chemotherapy - 2018, Received Radiation - 2018 Medical And Surgical History Notes Respiratory lung nodules Genitourinary hx: bladder cancer Musculoskeletal OP Oncologic bladder cancer Review of Systems (ROS) Constitutional Symptoms (General Health) Denies complaints or symptoms of Fatigue, Fever, Chills, Marked Weight Change. Eyes Denies complaints or symptoms of Dry Eyes, Vision Changes, Glasses /  Contacts. Ear/Nose/Mouth/Throat Denies complaints or symptoms of Difficult clearing ears, Sinusitis. Hematologic/Lymphatic Denies complaints or symptoms of Bleeding / Clotting Disorders, Human Immunodeficiency Virus. Respiratory Denies complaints or symptoms of Chronic or frequent coughs, Shortness of Breath. Cardiovascular Complains or has symptoms of LE edema. Denies complaints or symptoms of Chest pain. Gastrointestinal Denies complaints or symptoms of Frequent diarrhea, Nausea, Vomiting. Endocrine Denies complaints or symptoms of Hepatitis, Thyroid disease, Polydypsia (Excessive Thirst). Genitourinary Denies complaints or symptoms of Kidney failure/ Dialysis, Incontinence/dribbling. Immunological Denies complaints or symptoms of Hives, Itching. Integumentary (Skin) Complains or has symptoms of Wounds, Swelling. Denies complaints or symptoms of Bleeding or bruising tendency, Breakdown. Neurologic Denies complaints or symptoms of Numbness/parasthesias, Focal/Weakness. Psychiatric Denies complaints or symptoms of Anxiety, Claustrophobia. Objective Constitutional Patient is hypertensive.. Pulse regular and within target range for patient.Marland Kitchen Respirations regular, non-labored and within target NA, Tina T. ([id number]) range.. Temperature is normal and within the target range for the patient.Marland Kitchen appears in no distress. Vitals Time Taken: 1:00 PM, Height: 65 in, Source: Stated, Weight: 138 lbs, Source: Measured, BMI: 23, Temperature: 98.5  F, Pulse: 74 bpm, Respiratory Rate: 16 breaths/min, Blood Pressure: 174/44 mmHg. Eyes Conjunctivae clear. No discharge. Respiratory Respiratory effort is easy and symmetric bilaterally. Rate is normal at rest and on room air.Marland Kitchen Poor air entry bilaterally.. Cardiovascular Heart rhythm and rate regular, without murmur or gallop. No evidence of CHF. Femoral pulses palpable. Pedal pulses are palpable. Nonpitting edema in the right greater than left  leg. Hemosiderin staining. Lymphatic None palpable in the popliteal or inguinal area. Psychiatric Somewhat depressed and anxious. General Notes: Wound exam; the patient has a large oval-shaped area on the right anterior mid tibia. This has visible subcutaneous tissue around almost all of it. Our intake nurse remove the Steri-Strips there is leaking serosanguineous fluid coming out from under this. The skin over the surface is dark black and nonviable she has some subcutaneous fluid probably hematoma. There is no clear infection around this. Integumentary (Hair, Skin) Anxious and chronic venous insufficiency in both legs. Wound #1 status is Open. Original cause of wound was Trauma. The wound is located on the Right,Anterior Lower Leg. The wound measures 6.5cm length x 8.5cm width x 0.1cm depth; 43.393cm^2 area and 4.339cm^3 volume. There is Fat Layer (Subcutaneous Tissue) Exposed exposed. There is no tunneling or undermining noted. There is a large amount of sanguinous drainage noted. The wound margin is flat and intact. There is small (1-33%) red granulation within the wound bed. There is no necrotic tissue within the wound bed. Assessment Active Problems ICD-10 Contusion of right lower leg, subsequent encounter Non-pressure chronic ulcer of other part of right lower leg with other specified severity Chronic venous hypertension (idiopathic) with ulcer and inflammation of right  lower extremity Plan Wound Cleansing: Wound #1 Right,Anterior Lower Leg: Tina [name], Tina [name] ([id number]) May shower with protection. - Can use a cast protector. No tub bath. Primary Wound Dressing: Wound #1 Right,Anterior Lower Leg: Xeroform Secondary Dressing: Wound #1 Right,Anterior Lower Leg: ABD pad Follow-up Appointments: Wound #1 Right,Anterior Lower Leg: Return Appointment in 1 week. Edema Control: Wound #1 Right,Anterior Lower Leg: Kerlix and Coban - Right Lower Extremity Off-Loading: Wound #1  Right,Anterior Lower Leg: Other: - Elevate leg when sitting 1. I was prepared to remove the dead skin from this area clean off the surface of the wound however the patient did not want me to do this stating that it was painful when the area was manipulated in the emergency room 4 days ago. 2. We put Xeroform on this with an ABD and put her in kerlix and Coban compression with the intent to keep this dry. If the drainage leaks out of this she is to call us and we will bring her in to deal with changing the dressing or having 1 of the providers see this for debridement 3. I have told her that the surface that this is not viable at some point this will separate will need to come off however she was not prepared to let me do this today. Electronic Signature(s) Signed: 12/11/2018 5:12:30 PM By: Linton Ham MD Entered By: Linton Ham on 12/11/2018 14:10:41 Tina [name] ([id number]) -------------------------------------------------------------------------------- ROS/PFSH Details Patient Name: Tina [name]. Date of Service: 12/11/2018 1:15 PM Medical Record Number: [id number] Patient Account Number: [id number] Date of Birth/Sex: [birth year] (84 y.o. F) Treating RN: Montey Hora Primary Care Provider: Einar Pheasant Other Clinician: Referring Provider: Einar Pheasant Treating Provider/Extender: Tito Dine in Treatment: 0 Information Obtained From Patient Constitutional Symptoms (General Health) Complaints and Symptoms: Negative for: Fatigue; Fever; Chills; Marked Weight Change Eyes Complaints and Symptoms: Negative for: Dry Eyes; Vision Changes; Glasses / Contacts Ear/Nose/Mouth/Throat Complaints and Symptoms: Negative for: Difficult clearing ears; Sinusitis Hematologic/Lymphatic Complaints and Symptoms: Negative for: Bleeding / Clotting Disorders; Human Immunodeficiency Virus Medical History: Positive for: Anemia Negative for: Hemophilia; Human Immunodeficiency  Virus; Lymphedema; Sickle Cell Disease Respiratory Complaints and Symptoms: Negative for: Chronic or frequent coughs; Shortness of Breath Medical History: Positive for: Chronic Obstructive Pulmonary Disease (COPD) - emphysema Negative for: Aspiration; Asthma; Pneumothorax; Sleep Apnea; Tuberculosis Past Medical History Notes: lung nodules Cardiovascular Complaints and Symptoms: Positive for: LE edema Negative for: Chest pain Medical History: Positive for: Congestive Heart Failure; Coronary Artery Disease; Hypertension Negative for: Angina; Arrhythmia; Deep Vein Thrombosis; Hypotension; Myocardial Infarction; Peripheral Arterial Disease; Peripheral Venous Disease; Phlebitis; Vasculitis Gastrointestinal Tina [name], Tina [name]. ([id number]) Complaints and Symptoms: Negative for: Frequent diarrhea; Nausea; Vomiting Endocrine Complaints and Symptoms: Negative for: Hepatitis; Thyroid disease; Polydypsia (Excessive Thirst) Genitourinary Complaints and Symptoms: Negative for: Kidney failure/ Dialysis; Incontinence/dribbling Medical History: Negative for: End Stage Renal Disease Past Medical History Notes: hx: bladder cancer Immunological Complaints and Symptoms: Negative for: Hives; Itching Integumentary (Skin) Complaints and Symptoms: Positive for: Wounds; Swelling Negative for: Bleeding or bruising tendency; Breakdown Medical History: Negative for: History of Burn; History of pressure wounds Neurologic Complaints and Symptoms: Negative for: Numbness/parasthesias; Focal/Weakness Psychiatric Complaints and Symptoms: Negative for: Anxiety; Claustrophobia Musculoskeletal Medical History: Negative for: Gout; Rheumatoid Arthritis; Osteoarthritis; Osteomyelitis Past Medical History Notes: OP Oncologic Medical History: Positive for: Received Chemotherapy - 2018; Received Radiation - 2018 Past Medical History Notes: bladder cancer Immunizations Pneumococcal Vaccine: Received  Pneumococcal Vaccination: Yes Tina [name], Tina [name] ([id number]) Implantable Devices None Family and  Social History Cancer: Yes - Child,Mother,Siblings; Diabetes: Yes - Siblings; Heart Disease: No; Hereditary Spherocytosis: No; Hypertension: No; Kidney Disease: No; Lung Disease: Yes - Child; Seizures: No; Stroke: No; Thyroid Problems: No; Tuberculosis: No; Former smoker - 15 years; Marital Status - Divorced; Alcohol Use: Never; Drug Use: No History; Caffeine Use: Daily; Financial Concerns: No; Food, Clothing or Shelter Needs: No; Support System Lacking: No; Transportation Concerns: No Electronic Signature(s) Signed: 12/11/2018 4:41:53 PM By: Montey Hora Signed: 12/11/2018 5:12:30 PM By: Linton Ham MD Entered By: Montey Hora on 12/11/2018 13:13:18 Tina [name] ([id number]) -------------------------------------------------------------------------------- [place] Details Patient Name: Tina Leek T. Date of Service: 12/11/2018 Medical Record Number: [id number] Patient Account Number: [id number] Date of Birth/Sex: March 26, 1935 (84 y.o. F) Treating RN: Harold Barban Primary Care Provider: Einar Pheasant Other Clinician: Referring Provider: Einar Pheasant Treating Provider/Extender: Tito Dine in Treatment: 0 Diagnosis Coding ICD-10 Codes Code Description S80.11XD Contusion of right lower leg, subsequent encounter L97.818 Non-pressure chronic ulcer of other part of right lower leg with other specified severity I87.331 Chronic venous hypertension (idiopathic) with ulcer and inflammation of right lower extremity Facility Procedures CPT4 Code: [id number] Description: 99213 - WOUND CARE VISIT-LEV 3 EST PT Modifier: Quantity: 1 Physician Procedures CPT4: Description Modifier Quantity Code [id number] WC PHYS LEVEL 3 o NEW PT 1 ICD-10 Diagnosis Description S80.11XD Contusion of right lower leg, subsequent encounter L97.818 Non-pressure chronic ulcer of other part of right lower  leg with other specified  severity I87.331 Chronic venous hypertension (idiopathic) with ulcer and inflammation of right lower extremity Electronic Signature(s) Signed: 12/11/2018 5:12:30 PM By: Linton Ham MD Entered By: Linton Ham on 12/11/2018 14:11:18

## 2018-12-12 NOTE — Progress Notes (Signed)
DORETHY, TOMEY ([id number]) Visit Report for 12/11/2018 Allergy List Details Patient Name: Tina [name], Tina [name]. Date of Service: 12/11/2018 1:15 PM Medical Record Number: [id number] Patient Account Number: [id number] Date of Birth/Sex: Sep 06, 1934 (84 y.o. F) Treating RN: Montey Hora Primary Care Anne-Marie Genson: Einar Pheasant Other Clinician: Referring Jazmarie Biever: Einar Pheasant Treating Nykeria Mealing/Extender: Ricard Dillon Weeks in Treatment: 0 Allergies Active Allergies Evista Advair Diskus Allergy Notes Electronic Signature(s) Signed: 12/11/2018 4:41:53 PM By: Montey Hora Entered By: Montey Hora on 12/11/2018 13:08:14 Tina [name] ([id number]) -------------------------------------------------------------------------------- Arrival Information Details Patient Name: Tina [name]. Date of Service: 12/11/2018 1:15 PM Medical Record Number: [id number] Patient Account Number: [id number] Date of Birth/Sex: February 26, 1935 (84 y.o. F) Treating RN: Harold Barban Primary Care Jodey Burbano: Einar Pheasant Other Clinician: Referring Voyd Groft: Einar Pheasant Treating Lachlyn Vanderstelt/Extender: Tito Dine in Treatment: 0 Visit Information Patient Arrived: Ambulatory Arrival Time: 13:00 Accompanied By: self Transfer Assistance: None Patient Identification Verified: Yes Secondary Verification Process Completed: Yes Electronic Signature(s) Signed: 12/11/2018 4:24:39 PM By: Lorine Bears RCP, RRT, CHT Entered By: Lorine Bears on 12/11/2018 13:01:10 Tina [name] ([id number]) -------------------------------------------------------------------------------- Clinic Level of Care Assessment Details Patient Name: Tina Leek T. Date of Service: 12/11/2018 1:15 PM Medical Record Number: [id number] Patient Account Number: [id number] Date of Birth/Sex: Aug 19, 1934 (84 y.o. F) Treating RN: Harold Barban Primary Care Legacie Dillingham: Einar Pheasant Other  Clinician: Referring Maude Hettich: Einar Pheasant Treating Garan Frappier/Extender: Tito Dine in Treatment: 0 Clinic Level of Care Assessment Items TOOL 2 Quantity Score []  - Use when only an EandM is performed on the INITIAL visit 0 ASSESSMENTS - Nursing Assessment / Reassessment X - General Physical Exam (combine w/ comprehensive assessment (listed just below) when 1 20 performed on new pt. evals) X- 1 25 Comprehensive Assessment (HX, ROS, Risk Assessments, Wounds Hx, etc.) ASSESSMENTS - Wound and Skin Assessment / Reassessment X - Simple Wound Assessment / Reassessment - one wound 1 5 []  - 0 Complex Wound Assessment / Reassessment - multiple wounds []  - 0 Dermatologic / Skin Assessment (not related to wound area) ASSESSMENTS - Ostomy and/or Continence Assessment and Care []  - Incontinence Assessment and Management 0 []  - 0 Ostomy Care Assessment and Management (repouching, etc.) PROCESS - Coordination of Care X - Simple Patient / Family Education for ongoing care 1 15 []  - 0 Complex (extensive) Patient / Family Education for ongoing care []  - 0 Staff obtains Programmer, systems, Records, Test Results / Process Orders []  - 0 Staff telephones HHA, Nursing Homes / Clarify orders / etc []  - 0 Routine Transfer to another Facility (non-emergent condition) []  - 0 Routine Hospital Admission (non-emergent condition) []  - 0 New Admissions / Biomedical engineer / Ordering NPWT, Apligraf, etc. []  - 0 Emergency Hospital Admission (emergent condition) X- 1 10 Simple Discharge Coordination []  - 0 Complex (extensive) Discharge Coordination PROCESS - Special Needs []  - Pediatric / Minor Patient Management 0 []  - 0 Isolation Patient Management KIAJA, [name] ([id number]) []  - 0 Hearing / Language / Visual special needs []  - 0 Assessment of Community assistance (transportation, D/C planning, etc.) []  - 0 Additional assistance / Altered mentation []  - 0 Support Surface(s)  Assessment (bed, cushion, seat, etc.) INTERVENTIONS - Wound Cleansing / Measurement X - Wound Imaging (photographs - any number of wounds) 1 5 []  - 0 Wound Tracing (instead of photographs) X- 1 5 Simple Wound Measurement - one wound []  - 0 Complex Wound Measurement - multiple wounds X- 1 5 Simple Wound Cleansing - one wound []  -  0 Complex Wound Cleansing - multiple wounds INTERVENTIONS - Wound Dressings X - Small Wound Dressing one or multiple wounds 1 10 []  - 0 Medium Wound Dressing one or multiple wounds []  - 0 Large Wound Dressing one or multiple wounds []  - 0 Application of Medications - injection INTERVENTIONS - Miscellaneous []  - External ear exam 0 []  - 0 Specimen Collection (cultures, biopsies, blood, body fluids, etc.) []  - 0 Specimen(s) / Culture(s) sent or taken to Lab for analysis []  - 0 Patient Transfer (multiple staff / Harrel Lemon Lift / Similar devices) []  - 0 Simple Staple / Suture removal (25 or less) []  - 0 Complex Staple / Suture removal (26 or more) []  - 0 Hypo / Hyperglycemic Management (close monitor of Blood Glucose) X- 1 15 Ankle / Brachial Index (ABI) - do not check if billed separately Has the patient been seen at the hospital within the last three years: Yes Total Score: 115 Level Of Care: New/Established - Level 3 Electronic Signature(s) Signed: 12/11/2018 4:53:39 PM By: Harold Barban Entered By: Harold Barban on 12/11/2018 13:51:55 Tina [name] ([id number]) -------------------------------------------------------------------------------- Encounter Discharge Information Details Patient Name: Tina Leek T. Date of Service: 12/11/2018 1:15 PM Medical Record Number: [id number] Patient Account Number: [id number] Date of Birth/Sex: Jan 13, 1935 (84 y.o. F) Treating RN: Army Melia Primary Care Raynold Blankenbaker: Einar Pheasant Other Clinician: Referring Amesha Bailey: Einar Pheasant Treating Jamaurion Slemmer/Extender: Tito Dine in Treatment:  0 Encounter Discharge Information Items Discharge Condition: Stable Ambulatory Status: Ambulatory Discharge Destination: Home Transportation: Private Auto Accompanied By: self Schedule Follow-up Appointment: Yes Clinical Summary of Care: Electronic Signature(s) Signed: 12/11/2018 4:31:27 PM By: Army Melia Entered By: Army Melia on 12/11/2018 14:04:59 Tina [name] ([id number]) -------------------------------------------------------------------------------- Lower Extremity Assessment Details Patient Name: Tina Leek T. Date of Service: 12/11/2018 1:15 PM Medical Record Number: [id number] Patient Account Number: [id number] Date of Birth/Sex: 04-13-1935 (84 y.o. F) Treating RN: Montey Hora Primary Care Rosary Filosa: Einar Pheasant Other Clinician: Referring Yerlin Gasparyan: Einar Pheasant Treating Lenita Peregrina/Extender: Tito Dine in Treatment: 0 Edema Assessment Assessed: [Left: No] [Right: No] Edema: [Left: Yes] [Right: Yes] Calf Left: Right: Point of Measurement: 32 cm From Medial Instep 35 cm 34.7 cm Ankle Left: Right: Point of Measurement: 10 cm From Medial Instep 24 cm 24.6 cm Vascular Assessment Pulses: Dorsalis Pedis Palpable: [Left:Yes] [Right:Yes] Doppler Audible: [Left:Yes] [Right:Yes] Posterior Tibial Palpable: [Left:No] [Right:No] Doppler Audible: [Left:Yes] [Right:Yes] Blood Pressure: Brachial: [Left:158] Dorsalis Pedis: 112 Ankle: Posterior Tibial: 114 Ankle Brachial Index: [Left:0.72] Notes No ABI attempted in right eg r/t hematoma Electronic Signature(s) Signed: 12/11/2018 4:41:53 PM By: Montey Hora Entered By: Montey Hora on 12/11/2018 13:26:53 Tina [name] ([id number]) -------------------------------------------------------------------------------- Multi Wound Chart Details Patient Name: Tina Leek T. Date of Service: 12/11/2018 1:15 PM Medical Record Number: [id number] Patient Account Number: [id number] Date of Birth/Sex:  [birth year] (84 y.o. F) Treating RN: Harold Barban Primary Care Elisama Thissen: Einar Pheasant Other Clinician: Referring Adonias Demore: Einar Pheasant Treating Kannen Moxey/Extender: Tito Dine in Treatment: 0 Vital Signs Height(in): 65 Pulse(bpm): 67 Weight(lbs): 138 Blood Pressure(mmHg): 174/44 Body Mass Index(BMI): 23 Temperature(F): 98.5 Respiratory Rate 16 (breaths/min): Photos: [N/A:N/A] Wound Location: Right Lower Leg - Anterior N/A N/A Wounding Event: Trauma N/A N/A Primary Etiology: Skin Tear N/A N/A Comorbid History: Anemia, Chronic Obstructive N/A N/A Pulmonary Disease (COPD), Congestive Heart Failure, Coronary Artery Disease, Hypertension, Received Chemotherapy, Received Radiation Date Acquired: 12/06/2018 N/A N/A Weeks of Treatment: 0 N/A N/A Wound Status: Open N/A N/A Measurements L x W x D 6.5x8.5x0.1 N/A N/A (cm)  Area (cm) : 43.393 N/A N/A Volume (cm) : 4.339 N/A N/A Classification: Full Thickness Without N/A N/A Exposed Support Structures Exudate Amount: Large N/A N/A Exudate Type: Sanguinous N/A N/A Exudate Color: red N/A N/A Wound Margin: Flat and Intact N/A N/A Granulation Amount: Small (1-33%) N/A N/A Granulation Quality: Red N/A N/A Necrotic Amount: None Present (0%) N/A N/A Exposed Structures: Fat Layer (Subcutaneous N/A N/A Tissue) Exposed: Yes Fascia: No THERESE, ROCCO T. ([id number]) Tendon: No Muscle: No Joint: No Bone: No Epithelialization: None N/A N/A Treatment Notes Electronic Signature(s) Signed: 12/11/2018 5:12:30 PM By: Linton Ham MD Entered By: Linton Ham on 12/11/2018 14:01:06 Tina [name] ([id number]) -------------------------------------------------------------------------------- Crowley Plan Details Patient Name: Tina Leek T. Date of Service: 12/11/2018 1:15 PM Medical Record Number: [id number] Patient Account Number: [id number] Date of Birth/Sex: 1934-06-03 (84 y.o. F) Treating RN:  Harold Barban Primary Care Jaythen Hamme: Einar Pheasant Other Clinician: Referring Eshal Propps: Einar Pheasant Treating Azaylea Maves/Extender: Tito Dine in Treatment: 0 Active Inactive Wound/Skin Impairment Nursing Diagnoses: Impaired tissue integrity Goals: Ulcer/skin breakdown will have a volume reduction of 30% by week 4 Date Initiated: 12/11/2018 Target Resolution Date: 01/11/2019 Goal Status: Active Interventions: Assess patient/caregiver ability to obtain necessary supplies Assess patient/caregiver ability to perform ulcer/skin care regimen upon admission and as needed Assess ulceration(s) every visit Notes: Electronic Signature(s) Signed: 12/11/2018 4:53:39 PM By: Harold Barban Entered By: Harold Barban on 12/11/2018 13:41:23 Tina [name] ([id number]) -------------------------------------------------------------------------------- Pain Assessment Details Patient Name: Tina Leek T. Date of Service: 12/11/2018 1:15 PM Medical Record Number: [id number] Patient Account Number: [id number] Date of Birth/Sex: 03-16-1935 (84 y.o. F) Treating RN: Harold Barban Primary Care Garik Diamant: Einar Pheasant Other Clinician: Referring Rowin Bayron: Einar Pheasant Treating Lilburn Straw/Extender: Tito Dine in Treatment: 0 Active Problems Location of Pain Severity and Description of Pain Patient Has Paino No Site Locations Pain Management and Medication Current Pain Management: Electronic Signature(s) Signed: 12/11/2018 4:24:39 PM By: Paulla Fore, RRT, CHT Signed: 12/11/2018 4:53:39 PM By: Harold Barban Entered By: Lorine Bears on 12/11/2018 13:01:44 Tina [name] ([id number]) -------------------------------------------------------------------------------- Patient/Caregiver Education Details Patient Name: Tina Leek T. Date of Service: 12/11/2018 1:15 PM Medical Record Number: [id number] Patient Account Number:  [id number] Date of Birth/Gender: Oct 07, 1934 (83 y.o. F) Treating RN: Harold Barban Primary Care Physician: Einar Pheasant Other Clinician: Referring Physician: Einar Pheasant Treating Physician/Extender: Tito Dine in Treatment: 0 Education Assessment Education Provided To: Patient Education Topics Provided Wound/Skin Impairment: Handouts: Caring for Your Ulcer Methods: Demonstration, Explain/Verbal Responses: State content correctly Electronic Signature(s) Signed: 12/11/2018 4:53:39 PM By: Harold Barban Entered By: Harold Barban on 12/11/2018 13:42:39 Tina [name] ([id number]) -------------------------------------------------------------------------------- Wound Assessment Details Patient Name: Tina Leek T. Date of Service: 12/11/2018 1:15 PM Medical Record Number: [id number] Patient Account Number: [id number] Date of Birth/Sex: 12-24-1934 (84 y.o. F) Treating RN: Montey Hora Primary Care Solomon Skowronek: Einar Pheasant Other Clinician: Referring Ramin Zoll: Einar Pheasant Treating Kenadee Gates/Extender: Tito Dine in Treatment: 0 Wound Status Wound Number: 1 Primary Skin Tear Etiology: Wound Location: Right Lower Leg - Anterior Wound Open Wounding Event: Trauma Status: Date Acquired: 12/06/2018 Comorbid Anemia, Chronic Obstructive Pulmonary Disease Weeks Of Treatment: 0 History: (COPD), Congestive Heart Failure, Coronary Clustered Wound: No Artery Disease, Hypertension, Received Chemotherapy, Received Radiation Photos Wound Measurements Length: (cm) 6.5 Width: (cm) 8.5 Depth: (cm) 0.1 Area: (cm) 43.393 Volume: (cm) 4.339 % Reduction in Area: % Reduction in Volume: Epithelialization: None Tunneling: No Undermining: No Wound Description Full Thickness Without Exposed Support Classification: Structures Wound  Margin: Flat and Intact Exudate Large Amount: Exudate Type: Sanguinous Exudate Color: red Foul Odor After Cleansing:  No Slough/Fibrino No Wound Bed Granulation Amount: Small (1-33%) Exposed Structure Granulation Quality: Red Fascia Exposed: No Necrotic Amount: None Present (0%) Fat Layer (Subcutaneous Tissue) Exposed: Yes Tendon Exposed: No Muscle Exposed: No Joint Exposed: No Bone Exposed: No Spradley, Lenka T. ([id number]) Treatment Notes Wound #1 (Right, Anterior Lower Leg) Notes xeroform, ABD, kerlix, coban Electronic Signature(s) Signed: 12/11/2018 4:41:53 PM By: Montey Hora Entered By: Montey Hora on 12/11/2018 13:18:11 Tina [name] ([id number]) -------------------------------------------------------------------------------- Vitals Details Patient Name: Tina Leek T. Date of Service: 12/11/2018 1:15 PM Medical Record Number: [id number] Patient Account Number: [id number] Date of Birth/Sex: 1934-11-03 (84 y.o. F) Treating RN: Harold Barban Primary Care Blakeley Margraf: Einar Pheasant Other Clinician: Referring Shantina Chronister: Einar Pheasant Treating Haniyyah Sakuma/Extender: Tito Dine in Treatment: 0 Vital Signs Time Taken: 13:00 Temperature (F): 98.5 Height (in): 65 Pulse (bpm): 74 Source: Stated Respiratory Rate (breaths/min): 16 Weight (lbs): 138 Blood Pressure (mmHg): 174/44 Source: Measured Reference Range: 80 - 120 mg / dl Body Mass Index (BMI): 23 Electronic Signature(s) Signed: 12/11/2018 4:24:39 PM By: Lorine Bears RCP, RRT, CHT Entered By: Lorine Bears on 12/11/2018 13:04:57

## 2018-12-15 ENCOUNTER — Encounter: Payer: Self-pay | Admitting: Internal Medicine

## 2018-12-15 NOTE — Assessment & Plan Note (Signed)
Blood pressure as outlined.  Continue current medication.  Follow pressures.  Follow metabolic panel.  

## 2018-12-15 NOTE — Assessment & Plan Note (Signed)
Followed by nephrology.  Follow metabolic apnel.

## 2018-12-15 NOTE — Assessment & Plan Note (Signed)
Low carb diet and exercise.  Follow met b and a1c.   

## 2018-12-15 NOTE — Assessment & Plan Note (Signed)
Followed by cardiology.  Has noticed more sob with exertion.  Increased fatigue.  Decreased energy.  Feels from pulmonary standpoint, she is doing better.  No increased cough or chest congestion.  Discussed further cardiac evaluation.  She desires to f/u with Dr Saralyn Pilar.

## 2018-12-15 NOTE — Assessment & Plan Note (Signed)
On lovastatin.  Low cholesterol diet and exercise.  Follow lipid panel and liver function tests.   

## 2018-12-15 NOTE — Assessment & Plan Note (Signed)
Followed by urology and oncology

## 2018-12-15 NOTE — Assessment & Plan Note (Signed)
Followed by hematology 

## 2018-12-15 NOTE — Assessment & Plan Note (Signed)
Followed by Dr Raul Del.  Just evaluated 10/31/18.  Stable.  Continue inhalers.  No increased cough/congestion.

## 2018-12-15 NOTE — Assessment & Plan Note (Signed)
Has done well on current regimen.  Continue f/u with cardiology.

## 2018-12-18 ENCOUNTER — Encounter: Payer: Medicare Other | Attending: Internal Medicine | Admitting: Internal Medicine

## 2018-12-18 ENCOUNTER — Other Ambulatory Visit: Payer: Self-pay

## 2018-12-18 DIAGNOSIS — Z9221 Personal history of antineoplastic chemotherapy: Secondary | ICD-10-CM | POA: Diagnosis not present

## 2018-12-18 DIAGNOSIS — I87331 Chronic venous hypertension (idiopathic) with ulcer and inflammation of right lower extremity: Secondary | ICD-10-CM | POA: Diagnosis not present

## 2018-12-18 DIAGNOSIS — L03115 Cellulitis of right lower limb: Secondary | ICD-10-CM | POA: Insufficient documentation

## 2018-12-18 DIAGNOSIS — Z923 Personal history of irradiation: Secondary | ICD-10-CM | POA: Diagnosis not present

## 2018-12-18 DIAGNOSIS — I509 Heart failure, unspecified: Secondary | ICD-10-CM | POA: Diagnosis not present

## 2018-12-18 DIAGNOSIS — E785 Hyperlipidemia, unspecified: Secondary | ICD-10-CM | POA: Diagnosis not present

## 2018-12-18 DIAGNOSIS — I2510 Atherosclerotic heart disease of native coronary artery without angina pectoris: Secondary | ICD-10-CM | POA: Insufficient documentation

## 2018-12-18 DIAGNOSIS — W108XXA Fall (on) (from) other stairs and steps, initial encounter: Secondary | ICD-10-CM | POA: Diagnosis not present

## 2018-12-18 DIAGNOSIS — S8011XA Contusion of right lower leg, initial encounter: Secondary | ICD-10-CM | POA: Diagnosis not present

## 2018-12-18 DIAGNOSIS — Z955 Presence of coronary angioplasty implant and graft: Secondary | ICD-10-CM | POA: Insufficient documentation

## 2018-12-18 DIAGNOSIS — J449 Chronic obstructive pulmonary disease, unspecified: Secondary | ICD-10-CM | POA: Diagnosis not present

## 2018-12-18 DIAGNOSIS — S81801A Unspecified open wound, right lower leg, initial encounter: Secondary | ICD-10-CM | POA: Diagnosis not present

## 2018-12-18 DIAGNOSIS — I110 Hypertensive heart disease with heart failure: Secondary | ICD-10-CM | POA: Diagnosis not present

## 2018-12-18 DIAGNOSIS — S81811A Laceration without foreign body, right lower leg, initial encounter: Secondary | ICD-10-CM | POA: Diagnosis not present

## 2018-12-20 NOTE — Progress Notes (Signed)
Tina, [name] ([id number]) Visit Report for 12/18/2018 Debridement Details Patient Name: Tina [name], Tina [name]. Date of Service: 12/18/2018 2:45 PM Medical Record Number: [id number] Patient Account Number: [id number] Date of Birth/Sex: 1935/03/09 (84 y.o. F) Treating RN: Cornell Barman Primary Care Provider: Einar Pheasant Other Clinician: Referring Provider: Einar Pheasant Treating Provider/Extender: Beverly Gust in Treatment: 1 Debridement Performed for Wound #1 Right,Anterior Lower Leg Assessment: Performed By: Physician Tobi Bastos, MD Debridement Type: Debridement Level of Consciousness (Pre- Awake and Alert procedure): Pre-procedure Verification/Time Yes - 15:10 Out Taken: Start Time: 15:10 Pain Control: Lidocaine Total Area Debrided (L x W): 8.5 (cm) x 8.7 (cm) = 73.95 (cm) Tissue and other material Viable, Non-Viable, Blood Clots, Subcutaneous, Skin: Dermis debrided: Level: Skin/Subcutaneous Tissue Debridement Description: Excisional Instrument: Forceps, Scissors Bleeding: Minimum Hemostasis Achieved: Pressure End Time: 15:20 Response to Treatment: Procedure was tolerated well Level of Consciousness Awake and Alert (Post-procedure): Post Debridement Measurements of Total Wound Length: (cm) 8.5 Width: (cm) 8.7 Depth: (cm) 0.2 Volume: (cm) 11.616 Character of Wound/Ulcer Post Debridement: Requires Further Debridement Post Procedure Diagnosis Same as Pre-procedure Electronic Signature(s) Signed: 12/18/2018 4:03:26 PM By: Tobi Bastos Signed: 12/20/2018 5:41:45 PM By: Gretta Cool, BSN, RN, CWS, Kim RN, BSN Entered By: Gretta Cool, BSN, RN, CWS, Kim on 12/18/2018 15:15:54 [name] ([id number]) -------------------------------------------------------------------------------- HPI Details Patient Name: Tina Leek T. Date of Service: 12/18/2018 2:45 PM Medical Record Number: [id number] Patient Account Number: [id number] Date of Birth/Sex: 1934/08/24 (84 y.o.  F) Treating RN: Cornell Barman Primary Care Provider: Einar Pheasant Other Clinician: Referring Provider: Einar Pheasant Treating Provider/Extender: Beverly Gust in Treatment: 1 History of Present Illness HPI Description: ADMISSION 12/11/2018 This is an 84 year old woman with multiple medical problems. She was in the ER on 7/25 after falling while going up brick stairs. She had a fairly substantial skin tear on her right anterior leg. The skin was reattached with Steri-Strips. She was told that it would "fall off". They put Curlex and Coban on this. She has had previous wounds on the left anterior leg that she was able to get the heel roughly 3 years ago. 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 8/5-Returns at 1 week right lacerated wound on the right anterior leg. Today the wound has the skin flap on it with hematoma below the skin flap line loosely on top. Electronic Signature(s) Signed: 12/18/2018 3:23:45 PM By: Tobi Bastos Entered By: Tobi Bastos on 12/18/2018 15:23:44 [name] ([id number]) -------------------------------------------------------------------------------- Physical Exam Details Patient Name: Tina Leek T. Date of Service: 12/18/2018 2:45 PM Medical Record Number: [id number] Patient Account Number: [id number] Date of Birth/Sex: 11-Jun-1934 (84 y.o. F) Treating RN: Cornell Barman Primary Care Provider: Einar Pheasant Other Clinician: Referring Provider: Einar Pheasant Treating Provider/Extender: Beverly Gust in Treatment: 1 Constitutional alert and oriented x 3. sitting or standing blood pressure is within target range for patient.. supine blood pressure is  within target range for patient.. pulse regular and within target range for patient.Marland Kitchen respirations regular, non-labored and within target range for patient.Marland Kitchen temperature within target range for patient.. . . Well-nourished and well-hydrated in no acute distress. Notes Right anterior leg skin tear with skin flap that is not taking, organized hematoma below it, I used pickup forceps and scissors  to cut out the skin flap, I used a #3 curette to lyse the hematoma and cut out some portions of it with the dried blood below. The edges of the wound are noted to have some granulation tissue, Electronic Signature(s) Signed: 12/18/2018 3:24:51 PM By: Tobi Bastos Entered By: Tobi Bastos on 12/18/2018 15:24:51 [name] ([id number]) -------------------------------------------------------------------------------- Physician Orders Details Patient Name: Tina Leek T. Date of Service: 12/18/2018 2:45 PM Medical Record Number: [id number] Patient Account Number: [id number] Date of Birth/Sex: [birth year] (84 y.o. F) Treating RN: Cornell Barman Primary Care Provider: Einar Pheasant Other Clinician: Referring Provider: Einar Pheasant Treating Provider/Extender: Beverly Gust in Treatment: 1 Verbal / Phone Orders: No Diagnosis Coding Wound Cleansing Wound #1 Right,Anterior Lower Leg o Cleanse wound with mild soap and water Anesthetic (add to Medication List) Wound #1 Right,Anterior Lower Leg o Topical Lidocaine 4% cream applied to wound bed prior to debridement (In Clinic Only). Primary Wound Dressing Wound #1 Right,Anterior Lower Leg o Silver Alginate Secondary Dressing o ABD pad Dressing Change Frequency Wound #1 Right,Anterior Lower Leg o Change dressing every week Follow-up Appointments Wound #1 Right,Anterior Lower Leg o Return Appointment in 1 week. Edema Control Wound #1 Right,Anterior Lower Leg o Kerlix and Coban - Right Lower Extremity Patient  Medications Allergies: Evista, Advair Diskus Notifications Medication Indication Start End lidocaine DOSE topical 4 % cream - cream topical Electronic Signature(s) Signed: 12/18/2018 4:03:26 PM By: Tobi Bastos Signed: 12/20/2018 5:41:45 PM By: Gretta Cool, BSN, RN, CWS, Kim RN, BSN Entered By: Gretta Cool, BSN, RN, CWS, Kim on 12/18/2018 15:20:02 [name] ([id number]) [name] ([id number]) -------------------------------------------------------------------------------- Progress Note Details Patient Name: Tina Leek T. Date of Service: 12/18/2018 2:45 PM Medical Record Number: [id number] Patient Account Number: [id number] Date of Birth/Sex: 15-Dec-1934 (84 y.o. F) Treating RN: Cornell Barman Primary Care Provider: Einar Pheasant Other Clinician: Referring Provider: Einar Pheasant Treating Provider/Extender: Beverly Gust in Treatment: 1 Subjective History of Present Illness (HPI) ADMISSION 12/11/2018 This is an 84 year old woman with multiple medical problems. She was in the ER on 7/25 after falling while going up brick stairs. She had a fairly substantial skin tear on her right anterior leg. The skin was reattached with Steri-Strips. She was told that it would "fall off". They put Curlex and Coban on this. She has had previous wounds on the left anterior leg that she was able to get the heel roughly 3 years ago. 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 8/5-Returns at 1 week right lacerated wound on the right anterior leg. Today the wound has the skin flap on it with hematoma below the skin flap line loosely on top. Objective Constitutional alert and oriented x 3. sitting or standing blood  pressure is within target range for patient.. supine blood pressure is within target range for patient.. pulse regular and within target range for patient.Marland Kitchen respirations regular, non-labored and within target range for patient.Marland Kitchen temperature within target range for patient.. Well-nourished and well-hydrated in no acute distress. Vitals Time Taken: 2:27 PM, Height: 65 in, Weight: 138 lbs, BMI: 23, Temperature: 98.3 F, Pulse: 72 bpm, Respiratory Rate: 18 breaths/min, Blood Pressure: 165/70 mmHg. General Notes: Please take BP Manually General Notes: Right anterior leg skin tear with skin flap  that is not taking, organized hematoma below it, I used pickup forceps and scissors to cut out the skin flap, I used a #3 curette to lyse the hematoma and cut out some portions of it with the dried blood below. The edges of the wound are noted to have some granulation tissue, Integumentary (Hair, Skin) Wound #1 status is Open. Original cause of wound was Trauma. The wound is located on the Right,Anterior Lower Leg. The wound measures 8.5cm length x 8.7cm width x 0.1cm depth; 58.08cm^2 area and 5.808cm^3 volume. There is Fat Layer (Subcutaneous Tissue) Exposed exposed. There is no tunneling or undermining noted. There is a large amount of sanguinous drainage noted. The wound margin is flat and intact. There is small (1-33%) red granulation within the wound bed. SHAILYN, WEYANDT T. ([id number]) There is no necrotic tissue within the wound bed. Procedures Wound #1 Pre-procedure diagnosis of Wound #1 is a Skin Tear located on the Right,Anterior Lower Leg . There was a Excisional Skin/Subcutaneous Tissue Debridement with a total area of 73.95 sq cm performed by Tobi Bastos, MD. With the following instrument(s): Forceps, and Scissors to remove Viable and Non-Viable tissue/material. Material removed includes Blood Clots, Subcutaneous Tissue, and Skin: Dermis after achieving pain control using Lidocaine. A time out  was conducted at 15:10, prior to the start of the procedure. A Minimum amount of bleeding was controlled with Pressure. The procedure was tolerated well. Post Debridement Measurements: 8.5cm length x 8.7cm width x 0.2cm depth; 11.616cm^3 volume. Character of Wound/Ulcer Post Debridement requires further debridement. Post procedure Diagnosis Wound #1: Same as Pre-Procedure Plan Wound Cleansing: Wound #1 Right,Anterior Lower Leg: Cleanse wound with mild soap and water Anesthetic (add to Medication List): Wound #1 Right,Anterior Lower Leg: Topical Lidocaine 4% cream applied to wound bed prior to debridement (In Clinic Only). Primary Wound Dressing: Wound #1 Right,Anterior Lower Leg: Silver Alginate Secondary Dressing: ABD pad Dressing Change Frequency: Wound #1 Right,Anterior Lower Leg: Change dressing every week Follow-up Appointments: Wound #1 Right,Anterior Lower Leg: Return Appointment in 1 week. Edema Control: Wound #1 Right,Anterior Lower Leg: Kerlix and Coban - Right Lower Extremity The following medication(s) was prescribed: lidocaine topical 4 % cream cream topical was prescribed at facility We will use silver alginate on the wound, this should start to heal up with removal of hematoma material Return to clinic next week BRENDALIZ, KUK ([id number]) Electronic Signature(s) Signed: 12/18/2018 3:27:07 PM By: Tobi Bastos Entered By: Tobi Bastos on 12/18/2018 15:27:06 [name] ([id number]) -------------------------------------------------------------------------------- SuperBill Details Patient Name: Tina Leek T. Date of Service: 12/18/2018 Medical Record Number: [id number] Patient Account Number: [id number] Date of Birth/Sex: April 12, 1935 (84 y.o. F) Treating RN: Cornell Barman Primary Care Provider: Einar Pheasant Other Clinician: Referring Provider: Einar Pheasant Treating Provider/Extender: Beverly Gust in Treatment: 1 Diagnosis Coding ICD-10  Codes Code Description S80.11XD Contusion of right lower leg, subsequent encounter L97.818 Non-pressure chronic ulcer of other part of right lower leg with other specified severity I87.331 Chronic venous hypertension (idiopathic) with ulcer and inflammation of right lower extremity Facility Procedures CPT4 Code: 24268341 Description: 11042 - DEB SUBQ TISSUE 20 SQ CM/< ICD-10 Diagnosis Description S80.11XD Contusion of right lower leg, subsequent encounter Modifier: Quantity: 1 CPT4 Code: 96222979 Description: 89211 - DEB SUBQ TISS EA ADDL 20CM ICD-10 Diagnosis Description S80.11XD Contusion of right lower leg, subsequent encounter Modifier: Quantity: 3 Physician Procedures CPT4 Code: 9417408 Description: 14481 - WC PHYS SUBQ TISS 20 SQ CM ICD-10 Diagnosis Description S80.11XD Contusion of right lower leg, subsequent encounter  Modifier: Quantity: 1 CPT4 Code: 2820601 Description: 56153 - WC PHYS SUBQ TISS EA ADDL 20 CM ICD-10 Diagnosis Description S80.11XD Contusion of right lower leg, subsequent encounter Modifier: Quantity: 3 Electronic Signature(s) Signed: 12/18/2018 3:37:44 PM By: Tobi Bastos Entered By: Tobi Bastos on 12/18/2018 15:37:43

## 2018-12-20 NOTE — Progress Notes (Signed)
AVONELLE, VIVEROS ([id number]) Visit Report for 12/18/2018 Arrival Information Details Patient Name: Tina [name], Tina [name]. Date of Service: 12/18/2018 2:45 PM Medical Record Number: [id number] Patient Account Number: [id number] Date of Birth/Sex: [birth year] (84 y.o. F) Treating RN: Tina [name] Primary Care Tina [name]: Tina [name] Other Clinician: Referring Sevanna Ballengee: Tina [name] Treating Osaze Hubbert/Extender: Tina [name] in Treatment: 1 Visit Information History Since Last Visit Added or deleted any medications: No Patient Arrived: Ambulatory Any new allergies or adverse reactions: No Arrival Time: 14:45 Had a fall or experienced change in No Accompanied By: self activities of daily living that may affect Transfer Assistance: None risk of falls: Patient Identification Verified: Yes Signs or symptoms of abuse/neglect since last visito No Secondary Verification Process Completed: Yes Hospitalized since last visit: No Has Dressing in Place as Prescribed: Yes Has Compression in Place as Prescribed: Yes Pain Present Now: Yes Electronic Signature(s) Signed: 12/18/2018 3:45:57 PM By: Tina [name] Entered By: Tina [name] on 12/18/2018 14:47:40 Tina [name] ([id number]) -------------------------------------------------------------------------------- Encounter Discharge Information Details Patient Name: Tina Leek T. Date of Service: 12/18/2018 2:45 PM Medical Record Number: [id number] Patient Account Number: [id number] Date of Birth/Sex: 1934-06-10 (84 y.o. F) Treating RN: Tina [name] Primary Care Americus Scheurich: Tina [name] Other Clinician: Referring Ezrah Dembeck: Tina [name] Treating Donovyn Guidice/Extender: Tina [name] in Treatment: 1 Encounter Discharge Information Items Post Procedure Vitals Discharge Condition: Stable Temperature (F): 98.3 Ambulatory Status: Ambulatory Pulse (bpm): 72 Discharge Destination: Home Respiratory Rate (breaths/min):  18 Transportation: Private Auto Blood Pressure (mmHg): 165/70 Accompanied By: self Schedule Follow-up Appointment: Yes Clinical Summary of Care: Electronic Signature(s) Signed: 12/20/2018 5:41:45 PM By: Tina [name], BSN, RN, CWS, Kim RN, BSN Entered By: Tina [name], BSN, RN, CWS, Kim on 12/18/2018 15:22:04 Tina [name] ([id number]) -------------------------------------------------------------------------------- Lower Extremity Assessment Details Patient Name: Tina Leek T. Date of Service: 12/18/2018 2:45 PM Medical Record Number: [id number] Patient Account Number: [id number] Date of Birth/Sex: 1934-09-09 (84 y.o. F) Treating RN: Tina [name] Primary Care Anayeli Arel: Tina [name] Other Clinician: Referring Javier Mamone: Tina [name] Treating Frazer Rainville/Extender: Tina [name] in Treatment: 1 Edema Assessment Assessed: [Left: No] [Right: No] [Left: Edema] [Right: :] Calf Left: Right: Point of Measurement: 32 cm From Medial Instep 36 cm cm Ankle Left: Right: Point of Measurement: 10 cm From Medial Instep 22 cm cm Vascular Assessment Pulses: Dorsalis Pedis Palpable: [Left:Yes] Posterior Tibial Palpable: [Left:Yes] Electronic Signature(s) Signed: 12/18/2018 3:45:57 PM By: Tina [name] Entered By: Tina [name] on 12/18/2018 15:01:28 Tina [name] ([id number]) -------------------------------------------------------------------------------- Multi Wound Chart Details Patient Name: Tina Leek T. Date of Service: 12/18/2018 2:45 PM Medical Record Number: [id number] Patient Account Number: [id number] Date of Birth/Sex: 06/20/1934 (84 y.o. F) Treating RN: Tina [name] Primary Care Ilyas Lipsitz: Tina [name] Other Clinician: Referring Lenea Bywater: Tina [name] Treating Suhaas Agena/Extender: Tina [name] in Treatment: 1 Vital Signs Height(in): 65 Pulse(bpm): 72 Weight(lbs): 138 Blood Pressure(mmHg): 165/70 Body Mass Index(BMI): 23 Temperature(F): 98.3 Respiratory  Rate 18 (breaths/min): Photos: [N/A:N/A] Wound Location: Right Lower Leg - Anterior N/A N/A Wounding Event: Trauma N/A N/A Primary Etiology: Skin Tear N/A N/A Comorbid History: Anemia, Chronic Obstructive N/A N/A Pulmonary Disease (COPD), Congestive Heart Failure, Coronary Artery Disease, Hypertension, Received Chemotherapy, Received Radiation Date Acquired: 12/06/2018 N/A N/A Weeks of Treatment: 1 N/A N/A Wound Status: Open N/A N/A Measurements L x W x D 8.5x8.7x0.1 N/A N/A (cm) Area (cm) : 58.08 N/A N/A Volume (cm) : 5.808 N/A N/A % Reduction in Area: -33.80% N/A N/A % Reduction in Volume: -33.90% N/A N/A Classification: Full Thickness Without N/A N/A Exposed Support  Structures Exudate Amount: Large N/A N/A Exudate Type: Sanguinous N/A N/A Exudate Color: red N/A N/A Wound Margin: Flat and Intact N/A N/A Granulation Amount: Small (1-33%) N/A N/A Granulation Quality: Red N/A N/A Necrotic Amount: None Present (0%) N/A N/A Exposed Structures: N/A N/A Tina [name], Tina [name] ([id number]) Fat Layer (Subcutaneous Tissue) Exposed: Yes Fascia: No Tendon: No Muscle: No Joint: No Bone: No Epithelialization: None N/A N/A Treatment Notes Electronic Signature(s) Signed: 12/20/2018 5:41:45 PM By: Tina [name], BSN, RN, CWS, Kim RN, BSN Entered By: Tina [name], BSN, RN, CWS, Kim on 12/18/2018 15:12:54 Tina [name] ([id number]) -------------------------------------------------------------------------------- [place] Details Patient Name: Tina [name]. Date of Service: 12/18/2018 2:45 PM Medical Record Number: [id number] Patient Account Number: [id number] Date of Birth/Sex: [birth year] (84 y.o. F) Treating RN: Tina [name] Primary Care Sybil Shrader: Tina [name] Other Clinician: Referring Celia Gibbons: Tina [name] Treating Masako Overall/Extender: Tina [name] in Treatment: 1 Active Inactive Abuse / Safety / Falls / Self Care Management Nursing Diagnoses: History of  Falls Goals: Patient will not experience any injury related to falls Date Initiated: 12/18/2018 Target Resolution Date: 01/01/2019 Goal Status: Active Patient will remain injury free related to falls Date Initiated: 12/18/2018 Target Resolution Date: 01/01/2019 Goal Status: Active Interventions: Assess Activities of Daily Living upon admission and as needed Notes: Orientation to the Wound Care Program Nursing Diagnoses: Knowledge deficit related to the wound healing center program Goals: Patient/caregiver will verbalize understanding of the [facility] Date Initiated: 12/18/2018 Target Resolution Date: 12/18/2018 Goal Status: Active Interventions: Provide education on orientation to the wound center Notes: Wound/Skin Impairment Nursing Diagnoses: Impaired tissue integrity Goals: Ulcer/skin breakdown will have a volume reduction of 30% by week 4 Date Initiated: 12/11/2018 Target Resolution Date: 01/11/2019 Goal Status: Active LULIA, SCHRINER ([id number]) Interventions: Assess patient/caregiver ability to obtain necessary supplies Assess patient/caregiver ability to perform ulcer/skin care regimen upon admission and as needed Assess ulceration(s) every visit Notes: Electronic Signature(s) Signed: 12/20/2018 5:41:45 PM By: Tina [name], BSN, RN, CWS, Kim RN, BSN Entered By: Tina [name], BSN, RN, CWS, Kim on 12/18/2018 15:12:38 Tina [name] ([id number]) -------------------------------------------------------------------------------- Pain Assessment Details Patient Name: Tina Leek T. Date of Service: 12/18/2018 2:45 PM Medical Record Number: [id number] Patient Account Number: [id number] Date of Birth/Sex: 07-Jun-1934 (84 y.o. F) Treating RN: Tina [name] Primary Care Jasher Barkan: Tina [name] Other Clinician: Referring Chayse Gracey: Tina [name] Treating Lillyth Spong/Extender: Tina [name] in Treatment: 1 Active Problems Location of Pain Severity and Description of  Pain Patient Has Paino No Site Locations Pain Management and Medication Current Pain Management: Electronic Signature(s) Signed: 12/18/2018 3:45:57 PM By: Tina [name] Entered By: Tina [name] on 12/18/2018 14:47:52 Tina [name] ([id number]) -------------------------------------------------------------------------------- Patient/Caregiver Education Details Patient Name: Tina [name]. Date of Service: 12/18/2018 2:45 PM Medical Record Number: [id number] Patient Account Number: [id number] Date of Birth/Gender: 1934/10/29 (84 y.o. F) Treating RN: Tina [name] Primary Care Physician: Tina [name] Other Clinician: Referring Physician: Einar [name] Treating Physician/Extender: Tina [name] in Treatment: 1 Education Assessment Education Provided To: Patient Education Topics Provided Wound Debridement: Handouts: Wound Debridement Methods: Demonstration, Explain/Verbal Responses: State content correctly Wound/Skin Impairment: Handouts: Caring for Your Ulcer Methods: Demonstration, Explain/Verbal Responses: State content correctly Electronic Signature(s) Signed: 12/20/2018 5:41:45 PM By: Tina [name], BSN, RN, CWS, Kim RN, BSN Entered By: Tina [name], BSN, RN, CWS, Kim on 12/18/2018 15:20:45 Tina [name] ([id number]) -------------------------------------------------------------------------------- Wound Assessment Details Patient Name: Tina Leek T. Date of Service: 12/18/2018 2:45 PM Medical Record Number: [id number] Patient Account Number: [id number] Date of Birth/Sex: October 15, 1934 (84 y.o. F) Treating  RN: Tina [name] Primary Care Sultan Pargas: Tina [name] Other Clinician: Referring Semone Orlov: Tina [name] Treating Larico Dimock/Extender: Tina [name] in Treatment: 1 Wound Status Wound Number: 1 Primary Skin Tear Etiology: Wound Location: Right Lower Leg - Anterior Wound Open Wounding Event: Trauma Status: Date Acquired: 12/06/2018 Comorbid Anemia, Chronic  Obstructive Pulmonary Disease Weeks Of Treatment: 1 History: (COPD), Congestive Heart Failure, Coronary Clustered Wound: No Artery Disease, Hypertension, Received Chemotherapy, Received Radiation Photos Wound Measurements Length: (cm) 8.5 Width: (cm) 8.7 Depth: (cm) 0.1 Area: (cm) 58.08 Volume: (cm) 5.808 % Reduction in Area: -33.8% % Reduction in Volume: -33.9% Epithelialization: None Tunneling: No Undermining: No Wound Description Full Thickness Without Exposed Support Foul Odo Classification: Structures Slough/F Wound Margin: Flat and Intact Exudate Large Amount: Exudate Type: Sanguinous Exudate Color: red r After Cleansing: No ibrino No Wound Bed Granulation Amount: Small (1-33%) Exposed Structure Granulation Quality: Red Fascia Exposed: No Necrotic Amount: None Present (0%) Fat Layer (Subcutaneous Tissue) Exposed: Yes Tendon Exposed: No Muscle Exposed: No Joint Exposed: No Bone Exposed: No Rivard, Inis T. ([id number]) Treatment Notes Wound #1 (Right, Anterior Lower Leg) Notes silver alginate, ABD, kerlix, coban Electronic Signature(s) Signed: 12/18/2018 3:45:57 PM By: Tina [name] Entered By: Tina [name] on 12/18/2018 14:59:13 Tina [name] ([id number]) -------------------------------------------------------------------------------- Vitals Details Patient Name: Tina Leek T. Date of Service: 12/18/2018 2:45 PM Medical Record Number: [id number] Patient Account Number: [id number] Date of Birth/Sex: [birth year] (84 y.o. F) Treating RN: Tina [name] Primary Care Aahana Elza: Tina [name] Other Clinician: Referring Briar Witherspoon: Tina [name] Treating Maybree Riling/Extender: Tina [name] in Treatment: 1 Vital Signs Time Taken: 14:27 Temperature (F): 98.3 Height (in): 65 Pulse (bpm): 72 Weight (lbs): 138 Respiratory Rate (breaths/min): 18 Body Mass Index (BMI): 23 Blood Pressure (mmHg): 165/70 Reference Range: 80 - 120 mg /  dl Notes Please take BP Manually Electronic Signature(s) Signed: 12/18/2018 3:45:57 PM By: Tina [name] Entered By: Tina [name] on 12/18/2018 14:51:32

## 2018-12-25 ENCOUNTER — Other Ambulatory Visit: Payer: Self-pay

## 2018-12-25 ENCOUNTER — Encounter: Payer: Medicare Other | Admitting: Internal Medicine

## 2018-12-25 DIAGNOSIS — L03115 Cellulitis of right lower limb: Secondary | ICD-10-CM | POA: Diagnosis not present

## 2018-12-25 DIAGNOSIS — S81801A Unspecified open wound, right lower leg, initial encounter: Secondary | ICD-10-CM | POA: Diagnosis not present

## 2018-12-25 DIAGNOSIS — I87331 Chronic venous hypertension (idiopathic) with ulcer and inflammation of right lower extremity: Secondary | ICD-10-CM | POA: Diagnosis not present

## 2018-12-25 DIAGNOSIS — E785 Hyperlipidemia, unspecified: Secondary | ICD-10-CM | POA: Diagnosis not present

## 2018-12-25 DIAGNOSIS — S81811A Laceration without foreign body, right lower leg, initial encounter: Secondary | ICD-10-CM | POA: Diagnosis not present

## 2018-12-25 DIAGNOSIS — J449 Chronic obstructive pulmonary disease, unspecified: Secondary | ICD-10-CM | POA: Diagnosis not present

## 2018-12-25 DIAGNOSIS — S8011XA Contusion of right lower leg, initial encounter: Secondary | ICD-10-CM | POA: Diagnosis not present

## 2018-12-25 DIAGNOSIS — L539 Erythematous condition, unspecified: Secondary | ICD-10-CM | POA: Diagnosis not present

## 2018-12-25 DIAGNOSIS — R600 Localized edema: Secondary | ICD-10-CM | POA: Diagnosis not present

## 2018-12-26 NOTE — Progress Notes (Signed)
TORII, ROYSE ([id number]) Visit Report for 12/25/2018 Debridement Details Patient Name: Tina [name], Tina [name]. Date of Service: 12/25/2018 3:15 PM Medical Record Number: [id number] Patient Account Number: [id number] Date of Birth/Sex: [birth year] (84 y.o. F) Treating RN: Cornell Barman Primary Care Provider: Einar Pheasant Other Clinician: Referring Provider: Einar Pheasant Treating Provider/Extender: Beverly Gust in Treatment: 2 Debridement Performed for Wound #1 Right,Anterior Lower Leg Assessment: Performed By: Physician Tobi Bastos, MD Debridement Type: Debridement Level of Consciousness (Pre- Awake and Alert procedure): Pre-procedure Verification/Time Yes - 15:36 Out Taken: Start Time: 15:36 Pain Control: Lidocaine Total Area Debrided (L x W): 3 (cm) x 5.1 (cm) = 15.3 (cm) Tissue and other material Viable, Non-Viable, Eschar, Slough, Subcutaneous, Slough debrided: Level: Skin/Subcutaneous Tissue Debridement Description: Excisional Instrument: Curette Bleeding: Minimum Hemostasis Achieved: Pressure End Time: 15:37 Response to Treatment: Procedure was tolerated well Level of Consciousness Awake and Alert (Post-procedure): Post Debridement Measurements of Total Wound Length: (cm) 7.4 Width: (cm) 7.5 Depth: (cm) 0.2 Volume: (cm) 8.718 Character of Wound/Ulcer Post Debridement: Requires Further Debridement Post Procedure Diagnosis Same as Pre-procedure Electronic Signature(s) Signed: 12/25/2018 4:28:38 PM By: Tobi Bastos Signed: 12/25/2018 5:00:13 PM By: Gretta Cool, BSN, RN, CWS, Kim RN, BSN Entered By: Gretta Cool, BSN, RN, CWS, Kim on 12/25/2018 15:37:31 [name] ([id number]) -------------------------------------------------------------------------------- HPI Details Patient Name: Tina Leek T. Date of Service: 12/25/2018 3:15 PM Medical Record Number: [id number] Patient Account Number: [id number] Date of Birth/Sex: 05/23/1934 (84 y.o. F) Treating RN:  Cornell Barman Primary Care Provider: Einar Pheasant Other Clinician: Referring Provider: Einar Pheasant Treating Provider/Extender: Beverly Gust in Treatment: 2 History of Present Illness HPI Description: ADMISSION 12/11/2018 This is an 84 year old woman with multiple medical problems. She was in the ER on 7/25 after falling while going up brick stairs. She had a fairly substantial skin tear on her right anterior leg. The skin was reattached with Steri-Strips. She was told that it would "fall off". They put Curlex and Coban on this. She has had previous wounds on the left anterior leg that she was able to get the heel roughly 3 years ago. 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 8/5-Returns at 1 week right lacerated wound on the right anterior leg. Today the wound has the skin flap on it with hematoma below the skin flap line loosely on top. 8/12-Patient returns at 1 week after the skin flap and hematoma were addressed, we put her in 2 layer wrap and today the leg looks swollen and red, patient also complaining of some discomfort in that area Electronic Signature(s) Signed: 12/25/2018 3:37:25 PM By: Tobi Bastos Entered By: Tobi Bastos on 12/25/2018 15:37:25 [name] ([id number]) -------------------------------------------------------------------------------- Physical Exam Details Patient Name: Tina Leek T. Date of Service: 12/25/2018 3:15 PM Medical Record Number: [id number] Patient Account Number: [id number] Date of Birth/Sex: [birth year] (84 y.o. F) Treating RN: Cornell Barman Primary Care Provider: Einar Pheasant Other Clinician: Referring Provider: Einar Pheasant Treating  Provider/Extender: Beverly Gust in Treatment: 2 Constitutional alert and oriented x 3. sitting or standing blood pressure is within target range for patient.. supine blood pressure is within target range for patient.. pulse regular and within target range for patient.Marland Kitchen respirations regular, non-labored and within target range for patient.Marland Kitchen temperature within target  range for patient.. . . Well-nourished and well-hydrated in no acute distress. Notes The right anterior leg wound has some remnants of hematoma, the wound surface is reddish, surrounding skin is reddish with the leg swollen and puffy with evidence of cellulitis on the leg. There is not any drainage or necrotic tissue on the wound itself Some of the remnants of the hematoma that is organized we removed with curette there was minimal bleeding that stopped with pressure Electronic Signature(s) Signed: 12/25/2018 3:38:50 PM By: Tobi Bastos Entered By: Tobi Bastos on 12/25/2018 15:38:49 [name] ([id number]) -------------------------------------------------------------------------------- Physician Orders Details Patient Name: Tina Leek T. Date of Service: 12/25/2018 3:15 PM Medical Record Number: [id number] Patient Account Number: [id number] Date of Birth/Sex: 04/11/1935 (84 y.o. F) Treating RN: Cornell Barman Primary Care Provider: Einar Pheasant Other Clinician: Referring Provider: Einar Pheasant Treating Provider/Extender: Beverly Gust in Treatment: 2 Verbal / Phone Orders: No Diagnosis Coding Wound Cleansing Wound #1 Right,Anterior Lower Leg o Cleanse wound with mild soap and water o Dial antibacterial soap, wash wounds, rinse and pat dry prior to dressing wounds o May Shower, gently pat wound dry prior to applying new dressing. Anesthetic (add to Medication List) Wound #1 Right,Anterior Lower Leg o Topical Lidocaine 4% cream applied to wound bed prior to debridement (In Clinic  Only). Primary Wound Dressing Wound #1 Right,Anterior Lower Leg o Mepitel One Contact layer Secondary Dressing Wound #1 Right,Anterior Lower Leg o ABD and Kerlix/Conform o Silver Alginate Dressing Change Frequency Wound #1 Right,Anterior Lower Leg o Change dressing every other day. Follow-up Appointments Wound #1 Right,Anterior Lower Leg o Return Appointment in 1 week. Edema Control Wound #1 Right,Anterior Lower Leg o Other: - Tubigrip G, if tolerated Medications-please add to medication list. Wound #1 Right,Anterior Lower Leg o P.O. Antibiotics Patient Medications Allergies: Evista, Advair Diskus Notifications Medication Indication Start End Keflex 12/25/2018 DOSE oral 500 mg capsule - 1 capsule oral 4 times daily Tina [name], Tina [name] ([id number]) Electronic Signature(s) Signed: 12/25/2018 3:42:55 PM By: Tobi Bastos Entered By: Tobi Bastos on 12/25/2018 15:42:55 [name] ([id number]) -------------------------------------------------------------------------------- Progress Note Details Patient Name: Tina Leek T. Date of Service: 12/25/2018 3:15 PM Medical Record Number: [id number] Patient Account Number: [id number] Date of Birth/Sex: 05-26-1934 (84 y.o. F) Treating RN: Cornell Barman Primary Care Provider: Einar Pheasant Other Clinician: Referring Provider: Einar Pheasant Treating Provider/Extender: Beverly Gust in Treatment: 2 Subjective History of Present Illness (HPI) ADMISSION 12/11/2018 This is an 84 year old woman with multiple medical problems. She was in the ER on 7/25 after falling while going up brick stairs. She had a fairly substantial skin tear on her right anterior leg. The skin was reattached with Steri-Strips. She was told that it would "fall off". They put Curlex and Coban on this. She has had previous wounds on the left anterior leg that she was able to get the heel roughly 3 years ago. 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 8/5-Returns at 1 week right lacerated wound on the right anterior leg. Today the wound has the skin flap on it with hematoma below the skin flap line loosely on top. 8/12-Patient returns at 1 week after the skin flap and hematoma were addressed, we put her in 2 layer wrap  and today the leg looks swollen and red, patient also complaining of some discomfort in that area Objective Constitutional alert and oriented x 3. sitting or standing blood pressure is within target range for patient.. supine blood pressure is within target range for patient.. pulse regular and within target range for patient.Marland Kitchen respirations regular, non-labored and within target range for patient.Marland Kitchen temperature within target range for patient.. Well-nourished and well-hydrated in no acute distress. Vitals Time Taken: 3:05 PM, Height: 65 in, Weight: 138 lbs, BMI: 23, Temperature: 98.3 F, Pulse: 78 bpm, Respiratory Rate: 16 breaths/min, Blood Pressure: 157/53 mmHg. General Notes: The right anterior leg wound has some remnants of hematoma, the wound surface is reddish, surrounding skin is reddish with the leg swollen and puffy with evidence of cellulitis on the leg. There is not any drainage or necrotic tissue on the wound itself Some of the remnants of the hematoma that is organized we removed with curette there was minimal bleeding that stopped with pressure Integumentary (Hair, Skin) Wound #1 status is Open. Original cause of wound was Trauma. The wound is located on the Right,Anterior Lower Leg. The wound measures 7.4cm length x 7.5cm width x 0.2cm depth; 43.59cm^2 area and 8.718cm^3 volume. There is Fat Layer (Subcutaneous  Tissue) Exposed exposed. There is no tunneling or undermining noted. There is a large amount of Loeb, Jinna T. ([id number]) sanguinous drainage noted. The wound margin is flat and intact. There is medium (34-66%) red granulation within the wound bed. There is a medium (34-66%) amount of necrotic tissue within the wound bed. Procedures Wound #1 Pre-procedure diagnosis of Wound #1 is a Skin Tear located on the Right,Anterior Lower Leg . There was a Excisional Skin/Subcutaneous Tissue Debridement with a total area of 15.3 sq cm performed by Tobi Bastos, MD. With the following instrument(s): Curette to remove Viable and Non-Viable tissue/material. Material removed includes Eschar, Subcutaneous Tissue, and Slough after achieving pain control using Lidocaine. No specimens were taken. A time out was conducted at 15:36, prior to the start of the procedure. A Minimum amount of bleeding was controlled with Pressure. The procedure was tolerated well. Post Debridement Measurements: 7.4cm length x 7.5cm width x 0.2cm depth; 8.718cm^3 volume. Character of Wound/Ulcer Post Debridement requires further debridement. Post procedure Diagnosis Wound #1: Same as Pre-Procedure Plan Wound Cleansing: Wound #1 Right,Anterior Lower Leg: Cleanse wound with mild soap and water Dial antibacterial soap, wash wounds, rinse and pat dry prior to dressing wounds May Shower, gently pat wound dry prior to applying new dressing. Anesthetic (add to Medication List): Wound #1 Right,Anterior Lower Leg: Topical Lidocaine 4% cream applied to wound bed prior to debridement (In Clinic Only). Primary Wound Dressing: Wound #1 Right,Anterior Lower Leg: Mepitel One Contact layer Secondary Dressing: Wound #1 Right,Anterior Lower Leg: ABD and Kerlix/Conform Silver Alginate Dressing Change Frequency: Wound #1 Right,Anterior Lower Leg: Change dressing every other day. Follow-up Appointments: Wound #1 Right,Anterior Lower  Leg: Return Appointment in 1 week. Edema Control: Wound #1 Right,Anterior Lower Leg: Other: - Tubigrip G, if tolerated Medications-please add to medication list.: Wound #1 Right,Anterior Lower Leg: P.O. Antibiotics MCKENSEY, Tina T. ([id number]) 1. Post hematoma removal, with cellulitis currently, will move to every other daily dressing changes using silver alginate, with conform, this last time the dressing was stuck to the wound. 2. We will start patient on antibiotic-Keflex for 7-day course 3. Return to clinic next week, patient to call in if she has changes or symptoms of fevers or infection otherwise Electronic Signature(s) Signed: 12/25/2018 3:43:33  PM By: Tobi Bastos Previous Signature: 12/25/2018 3:40:39 PM Version By: Tobi Bastos Entered By: Tobi Bastos on 12/25/2018 15:43:33 [name] ([id number]) -------------------------------------------------------------------------------- [place] Details Patient Name: Tina Leek T. Date of Service: 12/25/2018 Medical Record Number: [id number] Patient Account Number: [id number] Date of Birth/Sex: 1935-05-04 (84 y.o. F) Treating RN: Cornell Barman Primary Care Provider: Einar Pheasant Other Clinician: Referring Provider: Einar Pheasant Treating Provider/Extender: Beverly Gust in Treatment: 2 Diagnosis Coding ICD-10 Codes Code Description S80.11XD Contusion of right lower leg, subsequent encounter L97.818 Non-pressure chronic ulcer of other part of right lower leg with other specified severity I87.331 Chronic venous hypertension (idiopathic) with ulcer and inflammation of right lower extremity Facility Procedures CPT4 Code: 37048889 Description: 16945 - DEB SUBQ TISSUE 20 SQ CM/< ICD-10 Diagnosis Description S80.11XD Contusion of right lower leg, subsequent encounter Modifier: Quantity: 1 Physician Procedures CPT4 Code: 0388828 Description: 00349 - WC PHYS SUBQ TISS 20 SQ CM ICD-10 Diagnosis Description  S80.11XD Contusion of right lower leg, subsequent encounter Modifier: Quantity: 1 Electronic Signature(s) Signed: 12/25/2018 3:40:52 PM By: Tobi Bastos Entered By: Tobi Bastos on 12/25/2018 15:40:52

## 2018-12-26 NOTE — Progress Notes (Signed)
Tina [name], Tina [name] ([id number]) Visit Report for 12/25/2018 Arrival Information Details Patient Name: Tina [name], Tina [name]. Date of Service: 12/25/2018 3:15 PM Medical Record Number: [id number] Patient Account Number: [id number] Date of Birth/Sex: [birth year] (84 y.o. F) Treating RN: Army Melia Primary Care Kenneith Stief: Einar Pheasant Other Clinician: Referring Gwyn Hieronymus: Einar Pheasant Treating Laiklyn Pilkenton/Extender: Beverly Gust in Treatment: 2 Visit Information History Since Last Visit Added or deleted any medications: No Patient Arrived: Ambulatory Any new allergies or adverse reactions: No Arrival Time: 15:03 Had a fall or experienced change in No Accompanied By: self activities of daily living that may affect Transfer Assistance: None risk of falls: Signs or symptoms of abuse/neglect since last visito No Hospitalized since last visit: No Has Dressing in Place as Prescribed: Yes Pain Present Now: No Electronic Signature(s) Signed: 12/25/2018 4:29:12 PM By: Army Melia Entered By: Army Melia on 12/25/2018 15:04:07 Tina [name] ([id number]) -------------------------------------------------------------------------------- Lower Extremity Assessment Details Patient Name: Tina Leek T. Date of Service: 12/25/2018 3:15 PM Medical Record Number: [id number] Patient Account Number: [id number] Date of Birth/Sex: 1934/06/07 (84 y.o. F) Treating RN: Army Melia Primary Care Minha Fulco: Einar Pheasant Other Clinician: Referring Candiss Galeana: Einar Pheasant Treating Navy Belay/Extender: Beverly Gust in Treatment: 2 Edema Assessment Assessed: [Left: No] [Right: No] Edema: [Left: N] [Right: o] Vascular Assessment Pulses: Dorsalis Pedis Palpable: [Right:Yes] Electronic Signature(s) Signed: 12/25/2018 4:29:12 PM By: Army Melia Entered By: Army Melia on 12/25/2018 15:17:23 Tina [name]  ([id number]) -------------------------------------------------------------------------------- Multi Wound Chart Details Patient Name: Tina Leek T. Date of Service: 12/25/2018 3:15 PM Medical Record Number: [id number] Patient Account Number: [id number] Date of Birth/Sex: 10-Jun-1934 (84 y.o. F) Treating RN: Cornell Barman Primary Care Demarus Latterell: Einar Pheasant Other Clinician: Referring Antoney Biven: Einar Pheasant Treating Daevion Navarette/Extender: Beverly Gust in Treatment: 2 Vital Signs Height(in): 65 Pulse(bpm): 78 Weight(lbs): 138 Blood Pressure(mmHg): 157/53 Body Mass Index(BMI): 23 Temperature(F): 98.3 Respiratory Rate 16 (breaths/min): Photos: [N/A:N/A] Wound Location: Right Lower Leg - Anterior N/A N/A Wounding Event: Trauma N/A N/A Primary Etiology: Skin Tear N/A N/A Comorbid History: Anemia, Chronic Obstructive N/A N/A Pulmonary Disease (COPD), Congestive Heart Failure, Coronary Artery Disease, Hypertension, Received Chemotherapy, Received Radiation Date Acquired: 12/06/2018 N/A N/A Weeks of Treatment: 2 N/A N/A Wound Status: Open N/A N/A Measurements L x W x D 7.4x7.5x0.2 N/A N/A (cm) Area (cm) : 43.59 N/A N/A Volume (cm) : 8.718 N/A N/A % Reduction in Area: -0.50% N/A N/A % Reduction in Volume: -100.90% N/A N/A Classification: Full Thickness Without N/A N/A Exposed Support Structures Exudate Amount: Large N/A N/A Exudate Type: Sanguinous N/A N/A Exudate Color: red N/A N/A Wound Margin: Flat and Intact N/A N/A Granulation Amount: Medium (34-66%) N/A N/A Granulation Quality: Red N/A N/A Necrotic Amount: Medium (34-66%) N/A N/A Exposed Structures: N/A N/A Tina [name], Tina [name] ([id number]) Fat Layer (Subcutaneous Tissue) Exposed: Yes Fascia: No Tendon: No Muscle: No Joint: No Bone: No Epithelialization: None N/A N/A Treatment Notes Electronic Signature(s) Signed: 12/25/2018 5:00:13 PM By: Gretta Cool, BSN, RN, CWS, Kim RN, BSN Entered By: Gretta Cool, BSN, RN, CWS,  Kim on 12/25/2018 15:28:09 Tina [name] ([id number]) -------------------------------------------------------------------------------- [place] Details Patient Name: Tina [name]. Date of Service: 12/25/2018 3:15 PM Medical Record Number: [id number] Patient Account Number: [id number] Date of Birth/Sex: 1935-03-16 (84 y.o. F) Treating RN: Cornell Barman Primary Care Pavneet Markwood: Einar Pheasant Other Clinician: Referring Cathey Fredenburg: Einar Pheasant Treating Enedelia Martorelli/Extender: Beverly Gust in Treatment: 2 Active Inactive Abuse / Safety / Falls / Self Care Management Nursing Diagnoses: History of Falls Goals: Patient will not experience  any injury related to falls Date Initiated: 12/18/2018 Target Resolution Date: 01/01/2019 Goal Status: Active Patient will remain injury free related to falls Date Initiated: 12/18/2018 Target Resolution Date: 01/01/2019 Goal Status: Active Interventions: Assess Activities of Daily Living upon admission and as needed Notes: Orientation to the Wound Care Program Nursing Diagnoses: Knowledge deficit related to the wound healing center program Goals: Patient/caregiver will verbalize understanding of the [place] Date Initiated: 12/18/2018 Target Resolution Date: 12/18/2018 Goal Status: Active Interventions: Provide education on orientation to the wound center Notes: Wound/Skin Impairment Nursing Diagnoses: Impaired tissue integrity Goals: Ulcer/skin breakdown will have a volume reduction of 30% by week 4 Date Initiated: 12/11/2018 Target Resolution Date: 01/11/2019 Goal Status: Active Tina [name], Tina [name] ([id number]) Interventions: Assess patient/caregiver ability to obtain necessary supplies Assess patient/caregiver ability to perform ulcer/skin care regimen upon admission and as needed Assess ulceration(s) every visit Notes: Electronic Signature(s) Signed: 12/25/2018 5:00:13 PM By: Gretta Cool, BSN, RN, CWS, Kim RN,  BSN Entered By: Gretta Cool, BSN, RN, CWS, Kim on 12/25/2018 15:27:59 Tina [name] ([id number]) -------------------------------------------------------------------------------- Pain Assessment Details Patient Name: Tina Leek T. Date of Service: 12/25/2018 3:15 PM Medical Record Number: [id number] Patient Account Number: [id number] Date of Birth/Sex: 1935-02-05 (84 y.o. F) Treating RN: Army Melia Primary Care Alesandra Smart: Einar Pheasant Other Clinician: Referring Deion Forgue: Einar Pheasant Treating Quenisha Lovins/Extender: Beverly Gust in Treatment: 2 Active Problems Location of Pain Severity and Description of Pain Patient Has Paino No Site Locations Pain Management and Medication Current Pain Management: Electronic Signature(s) Signed: 12/25/2018 4:29:12 PM By: Army Melia Entered By: Army Melia on 12/25/2018 15:04:26 Tina [name] ([id number]) -------------------------------------------------------------------------------- Patient/Caregiver Education Details Patient Name: Tina [name]. Date of Service: 12/25/2018 3:15 PM Medical Record Number: [id number] Patient Account Number: [id number] Date of Birth/Gender: [birth year] (84 y.o. F) Treating RN: Cornell Barman Primary Care Physician: Einar Pheasant Other Clinician: Referring Physician: Einar Pheasant Treating Physician/Extender: Beverly Gust in Treatment: 2 Education Assessment Education Provided To: Patient Education Topics Provided Wound/Skin Impairment: Handouts: Caring for Your Ulcer Methods: Demonstration, Explain/Verbal Responses: State content correctly Electronic Signature(s) Signed: 12/25/2018 5:00:13 PM By: Gretta Cool, BSN, RN, CWS, Kim RN, BSN Entered By: Gretta Cool, BSN, RN, CWS, Kim on 12/25/2018 15:38:57 Tina [name] ([id number]) -------------------------------------------------------------------------------- Wound Assessment Details Patient Name: Tina Leek T. Date of Service: 12/25/2018 3:15  PM Medical Record Number: [id number] Patient Account Number: [id number] Date of Birth/Sex: January 03, 1935 (84 y.o. F) Treating RN: Army Melia Primary Care Andreah Goheen: Einar Pheasant Other Clinician: Referring Osama Coleson: Einar Pheasant Treating Agness Sibrian/Extender: Beverly Gust in Treatment: 2 Wound Status Wound Number: 1 Primary Skin Tear Etiology: Wound Location: Right Lower Leg - Anterior Wound Open Wounding Event: Trauma Status: Date Acquired: 12/06/2018 Comorbid Anemia, Chronic Obstructive Pulmonary Disease Weeks Of Treatment: 2 History: (COPD), Congestive Heart Failure, Coronary Clustered Wound: No Artery Disease, Hypertension, Received Chemotherapy, Received Radiation Photos Wound Measurements Length: (cm) 7.4 Width: (cm) 7.5 Depth: (cm) 0.2 Area: (cm) 43.59 Volume: (cm) 8.718 % Reduction in Area: -0.5% % Reduction in Volume: -100.9% Epithelialization: None Tunneling: No Undermining: No Wound Description Full Thickness Without Exposed Support Classification: Structures Wound Margin: Flat and Intact Exudate Large Amount: Exudate Type: Sanguinous Exudate Color: red Foul Odor After Cleansing: No Slough/Fibrino No Wound Bed Granulation Amount: Medium (34-66%) Exposed Structure Granulation Quality: Red Fascia Exposed: No Necrotic Amount: Medium (34-66%) Fat Layer (Subcutaneous Tissue) Exposed: Yes Tendon Exposed: No Muscle Exposed: No Joint Exposed: No Bone Exposed: No Tina [name], Tina [name] ([id number]) Electronic Signature(s) Signed: 12/25/2018 4:29:12 PM By: Army Melia  Entered By: Army Melia on 12/25/2018 15:16:18 Tina [name] ([id number]) -------------------------------------------------------------------------------- Vitals Details Patient Name: Tina Leek T. Date of Service: 12/25/2018 3:15 PM Medical Record Number: [id number] Patient Account Number: [id number] Date of Birth/Sex: Mar 31, 1935 (84 y.o. F) Treating RN: Army Melia Primary Care  Klinton Candelas: Einar Pheasant Other Clinician: Referring Shay Bartoli: Einar Pheasant Treating Willowdean Luhmann/Extender: Beverly Gust in Treatment: 2 Vital Signs Time Taken: 15:05 Temperature (F): 98.3 Height (in): 65 Pulse (bpm): 78 Weight (lbs): 138 Respiratory Rate (breaths/min): 16 Body Mass Index (BMI): 23 Blood Pressure (mmHg): 157/53 Reference Range: 80 - 120 mg / dl Electronic Signature(s) Signed: 12/25/2018 4:29:12 PM By: Army Melia Entered By: Army Melia on 12/25/2018 15:15:30

## 2018-12-27 DIAGNOSIS — R0602 Shortness of breath: Secondary | ICD-10-CM | POA: Diagnosis not present

## 2018-12-27 DIAGNOSIS — I10 Essential (primary) hypertension: Secondary | ICD-10-CM | POA: Diagnosis not present

## 2018-12-27 DIAGNOSIS — J449 Chronic obstructive pulmonary disease, unspecified: Secondary | ICD-10-CM | POA: Diagnosis not present

## 2018-12-27 DIAGNOSIS — E7800 Pure hypercholesterolemia, unspecified: Secondary | ICD-10-CM | POA: Diagnosis not present

## 2018-12-27 DIAGNOSIS — R002 Palpitations: Secondary | ICD-10-CM | POA: Diagnosis not present

## 2018-12-27 DIAGNOSIS — I2510 Atherosclerotic heart disease of native coronary artery without angina pectoris: Secondary | ICD-10-CM | POA: Diagnosis not present

## 2018-12-27 DIAGNOSIS — Z955 Presence of coronary angioplasty implant and graft: Secondary | ICD-10-CM | POA: Diagnosis not present

## 2019-01-01 ENCOUNTER — Other Ambulatory Visit: Payer: Self-pay

## 2019-01-01 ENCOUNTER — Encounter: Payer: Medicare Other | Admitting: Internal Medicine

## 2019-01-01 DIAGNOSIS — S8011XA Contusion of right lower leg, initial encounter: Secondary | ICD-10-CM | POA: Diagnosis not present

## 2019-01-01 DIAGNOSIS — S81801A Unspecified open wound, right lower leg, initial encounter: Secondary | ICD-10-CM | POA: Diagnosis not present

## 2019-01-01 DIAGNOSIS — I87331 Chronic venous hypertension (idiopathic) with ulcer and inflammation of right lower extremity: Secondary | ICD-10-CM | POA: Diagnosis not present

## 2019-01-01 DIAGNOSIS — I872 Venous insufficiency (chronic) (peripheral): Secondary | ICD-10-CM | POA: Diagnosis not present

## 2019-01-01 DIAGNOSIS — E785 Hyperlipidemia, unspecified: Secondary | ICD-10-CM | POA: Diagnosis not present

## 2019-01-01 DIAGNOSIS — L03115 Cellulitis of right lower limb: Secondary | ICD-10-CM | POA: Diagnosis not present

## 2019-01-01 DIAGNOSIS — J449 Chronic obstructive pulmonary disease, unspecified: Secondary | ICD-10-CM | POA: Diagnosis not present

## 2019-01-02 NOTE — Progress Notes (Signed)
ROSALEAH, PERSON ([id number]) Visit Report for 01/01/2019 Arrival Information Details Patient Name: AMAND, LEMOINE. Date of Service: 01/01/2019 3:00 PM Medical Record Number: [id number] Patient Account Number: [id number] Date of Birth/Sex: 1934-05-25 (84 y.o. F) Treating RN: Montey Hora Primary Care Delaynee Alred: Einar Pheasant Other Clinician: Referring Tamre Cass: Einar Pheasant Treating Marcellus Pulliam/Extender: Tito Dine in Treatment: 3 Visit Information History Since Last Visit Added or deleted any medications: No Patient Arrived: Ambulatory Any new allergies or adverse reactions: No Arrival Time: 14:59 Had a fall or experienced change in No Accompanied By: self activities of daily living that may affect Transfer Assistance: None risk of falls: Patient Identification Verified: Yes Signs or symptoms of abuse/neglect since last visito No Secondary Verification Process Completed: Yes Hospitalized since last visit: No Implantable device outside of the clinic excluding No cellular tissue based products placed in the center since last visit: Has Dressing in Place as Prescribed: Yes Pain Present Now: No Electronic Signature(s) Signed: 01/01/2019 4:23:45 PM By: Montey Hora Entered By: Montey Hora on 01/01/2019 15:01:04 [name] ([id number]) -------------------------------------------------------------------------------- Clinic Level of Care Assessment Details Patient Name: [name] Date of Service: 01/01/2019 3:00 PM Medical Record Number: [id number] Patient Account Number: [id number] Date of Birth/Sex: 11/10/1934 (84 y.o. F) Treating RN: Cornell Barman Primary Care Niyana Chesbro: Einar Pheasant Other Clinician: Referring Toleen Lachapelle: Einar Pheasant Treating Hue Steveson/Extender: Tito Dine in Treatment: 3 Clinic Level of Care Assessment Items TOOL 4 Quantity Score []  - Use when only an EandM is performed on FOLLOW-UP visit 0 ASSESSMENTS - Nursing  Assessment / Reassessment []  - Reassessment of Co-morbidities (includes updates in patient status) 0 X- 1 5 Reassessment of Adherence to Treatment Plan ASSESSMENTS - Wound and Skin Assessment / Reassessment X - Simple Wound Assessment / Reassessment - one wound 1 5 []  - 0 Complex Wound Assessment / Reassessment - multiple wounds []  - 0 Dermatologic / Skin Assessment (not related to wound area) ASSESSMENTS - Focused Assessment []  - Circumferential Edema Measurements - multi extremities 0 []  - 0 Nutritional Assessment / Counseling / Intervention []  - 0 Lower Extremity Assessment (monofilament, tuning fork, pulses) []  - 0 Peripheral Arterial Disease Assessment (using hand held doppler) ASSESSMENTS - Ostomy and/or Continence Assessment and Care []  - Incontinence Assessment and Management 0 []  - 0 Ostomy Care Assessment and Management (repouching, etc.) PROCESS - Coordination of Care X - Simple Patient / Family Education for ongoing care 1 15 []  - 0 Complex (extensive) Patient / Family Education for ongoing care X- 1 10 Staff obtains Programmer, systems, Records, Test Results / Process Orders []  - 0 Staff telephones HHA, Nursing Homes / Clarify orders / etc []  - 0 Routine Transfer to another Facility (non-emergent condition) []  - 0 Routine Hospital Admission (non-emergent condition) []  - 0 New Admissions / Biomedical engineer / Ordering NPWT, Apligraf, etc. []  - 0 Emergency Hospital Admission (emergent condition) X- 1 10 Simple Discharge Coordination ELENI, FRANK. ([id number]) []  - 0 Complex (extensive) Discharge Coordination PROCESS - Special Needs []  - Pediatric / Minor Patient Management 0 []  - 0 Isolation Patient Management []  - 0 Hearing / Language / Visual special needs []  - 0 Assessment of Community assistance (transportation, D/C planning, etc.) []  - 0 Additional assistance / Altered mentation []  - 0 Support Surface(s) Assessment (bed, cushion, seat,  etc.) INTERVENTIONS - Wound Cleansing / Measurement X - Simple Wound Cleansing - one wound 1 5 []  - 0 Complex Wound Cleansing - multiple wounds X- 1 5 Wound Imaging (photographs -  any number of wounds) []  - 0 Wound Tracing (instead of photographs) X- 1 5 Simple Wound Measurement - one wound []  - 0 Complex Wound Measurement - multiple wounds INTERVENTIONS - Wound Dressings []  - Small Wound Dressing one or multiple wounds 0 []  - 0 Medium Wound Dressing one or multiple wounds X- 1 20 Large Wound Dressing one or multiple wounds []  - 0 Application of Medications - topical []  - 0 Application of Medications - injection INTERVENTIONS - Miscellaneous []  - External ear exam 0 []  - 0 Specimen Collection (cultures, biopsies, blood, body fluids, etc.) []  - 0 Specimen(s) / Culture(s) sent or taken to Lab for analysis []  - 0 Patient Transfer (multiple staff / Civil Service fast streamer / Similar devices) []  - 0 Simple Staple / Suture removal (25 or less) []  - 0 Complex Staple / Suture removal (26 or more) []  - 0 Hypo / Hyperglycemic Management (close monitor of Blood Glucose) []  - 0 Ankle / Brachial Index (ABI) - do not check if billed separately X- 1 5 Vital Signs Peer, Jalaiyah T. ([id number]) Has the patient been seen at the hospital within the last three years: Yes Total Score: 85 Level Of Care: New/Established - Level 3 Electronic Signature(s) Signed: 01/01/2019 5:55:20 PM By: Gretta Cool, BSN, RN, CWS, Kim RN, BSN Entered By: Gretta Cool, BSN, RN, CWS, Kim on 01/01/2019 15:44:04 [name] ([id number]) -------------------------------------------------------------------------------- Encounter Discharge Information Details Patient Name: [name] Date of Service: 01/01/2019 3:00 PM Medical Record Number: [id number] Patient Account Number: [id number] Date of Birth/Sex: 1935/05/08 (84 y.o. F) Treating RN: Cornell Barman Primary Care Franciscojavier Wronski: Einar Pheasant Other Clinician: Referring Stepan Verrette: Einar Pheasant Treating Macarthur Lorusso/Extender: Tito Dine in Treatment: 3 Encounter Discharge Information Items Discharge Condition: Stable Ambulatory Status: Ambulatory Discharge Destination: Home Transportation: Private Auto Accompanied By: self Schedule Follow-up Appointment: Yes Clinical Summary of Care: Electronic Signature(s) Signed: 01/01/2019 5:55:20 PM By: Gretta Cool, BSN, RN, CWS, Kim RN, BSN Entered By: Gretta Cool, BSN, RN, CWS, Kim on 01/01/2019 15:45:35 [name] ([id number]) -------------------------------------------------------------------------------- Lower Extremity Assessment Details Patient Name: [name] Date of Service: 01/01/2019 3:00 PM Medical Record Number: [id number] Patient Account Number: [id number] Date of Birth/Sex: 17-Nov-1934 (84 y.o. F) Treating RN: Montey Hora Primary Care Tabias Swayze: Einar Pheasant Other Clinician: Referring Lamondre Wesche: Einar Pheasant Treating Aneliese Beaudry/Extender: Ricard Dillon Weeks in Treatment: 3 Edema Assessment Assessed: [Left: No] [Right: No] Edema: [Left: Ye] [Right: s] Vascular Assessment Pulses: Dorsalis Pedis Palpable: [Right:Yes] Electronic Signature(s) Signed: 01/01/2019 4:23:45 PM By: Montey Hora Entered By: Montey Hora on 01/01/2019 15:12:11 [name] ([id number]) -------------------------------------------------------------------------------- Multi Wound Chart Details Patient Name: [name] Date of Service: 01/01/2019 3:00 PM Medical Record Number: [id number] Patient Account Number: [id number] Date of Birth/Sex: 01-Mar-1935 (84 y.o. F) Treating RN: Cornell Barman Primary Care Taralyn Ferraiolo: Einar Pheasant Other Clinician: Referring Edda Orea: Einar Pheasant Treating Cleo Santucci/Extender: Tito Dine in Treatment: 3 Vital Signs Height(in): 65 Pulse(bpm): 73 Weight(lbs): 138 Blood Pressure(mmHg): 156/84 Body Mass Index(BMI): 23 Temperature(F): 98.8 Respiratory  Rate 16 (breaths/min): Photos: [N/A:N/A] Wound Location: Right Lower Leg - Anterior N/A N/A Wounding Event: Trauma N/A N/A Primary Etiology: Skin Tear N/A N/A Comorbid History: Anemia, Chronic Obstructive N/A N/A Pulmonary Disease (COPD), Congestive Heart Failure, Coronary Artery Disease, Hypertension, Received Chemotherapy, Received Radiation Date Acquired: 12/06/2018 N/A N/A Weeks of Treatment: 3 N/A N/A Wound Status: Open N/A N/A Measurements L x W x D 6.4x6.3x0.1 N/A N/A (cm) Area (cm) : 31.667 N/A N/A Volume (cm) : 3.167 N/A N/A %  Reduction in Area: 27.00% N/A N/A % Reduction in Volume: 27.00% N/A N/A Classification: Full Thickness Without N/A N/A Exposed Support Structures Exudate Amount: Large N/A N/A Exudate Type: Sanguinous N/A N/A Exudate Color: red N/A N/A Wound Margin: Flat and Intact N/A N/A Granulation Amount: Large (67-100%) N/A N/A Granulation Quality: Red N/A N/A Necrotic Amount: Small (1-33%) N/A N/A Exposed Structures: N/A N/A SARAYU, PREVOST ([id number]) Fat Layer (Subcutaneous Tissue) Exposed: Yes Fascia: No Tendon: No Muscle: No Joint: No Bone: No Epithelialization: Small (1-33%) N/A N/A Treatment Notes Wound #1 (Right, Anterior Lower Leg) Notes silver alginate, ABD, coban, tubigrip Electronic Signature(s) Signed: 01/01/2019 5:33:48 PM By: Linton Ham MD Entered By: Linton Ham on 01/01/2019 17:11:50 [name] ([id number]) -------------------------------------------------------------------------------- [place] Details Patient Name: [name] Date of Service: 01/01/2019 3:00 PM Medical Record Number: [id number] Patient Account Number: [id number] Date of Birth/Sex: [birth year] (84 y.o. F) Treating RN: Cornell Barman Primary Care Attallah Ontko: Einar Pheasant Other Clinician: Referring Irie Dowson: Einar Pheasant Treating Marv Alfrey/Extender: Tito Dine in Treatment: 3 Active Inactive Abuse / Safety /  Falls / Self Care Management Nursing Diagnoses: History of Falls Goals: Patient will not experience any injury related to falls Date Initiated: 12/18/2018 Target Resolution Date: 01/01/2019 Goal Status: Active Patient will remain injury free related to falls Date Initiated: 12/18/2018 Target Resolution Date: 01/01/2019 Goal Status: Active Interventions: Assess Activities of Daily Living upon admission and as needed Notes: Orientation to the Wound Care Program Nursing Diagnoses: Knowledge deficit related to the wound healing center program Goals: Patient/caregiver will verbalize understanding of the [place] Program Date Initiated: 12/18/2018 Target Resolution Date: 12/18/2018 Goal Status: Active Interventions: Provide education on orientation to the wound center Notes: Wound/Skin Impairment Nursing Diagnoses: Impaired tissue integrity Goals: Ulcer/skin breakdown will have a volume reduction of 30% by week 4 Date Initiated: 12/11/2018 Target Resolution Date: 01/11/2019 Goal Status: Active ADRIE, PICKING ([id number]) Interventions: Assess patient/caregiver ability to obtain necessary supplies Assess patient/caregiver ability to perform ulcer/skin care regimen upon admission and as needed Assess ulceration(s) every visit Notes: Electronic Signature(s) Signed: 01/01/2019 5:55:20 PM By: Gretta Cool, BSN, RN, CWS, Kim RN, BSN Entered By: Gretta Cool, BSN, RN, CWS, Kim on 01/01/2019 15:39:02 [name] ([id number]) -------------------------------------------------------------------------------- Pain Assessment Details Patient Name: [name] Date of Service: 01/01/2019 3:00 PM Medical Record Number: [id number] Patient Account Number: [id number] Date of Birth/Sex: 07/03/1934 (84 y.o. F) Treating RN: Montey Hora Primary Care Karyss Frese: Einar Pheasant Other Clinician: Referring Monico Sudduth: Einar Pheasant Treating Lorriane Dehart/Extender: Tito Dine in Treatment:  3 Active Problems Location of Pain Severity and Description of Pain Patient Has Paino No Site Locations Pain Management and Medication Current Pain Management: Electronic Signature(s) Signed: 01/01/2019 4:23:45 PM By: Montey Hora Entered By: Montey Hora on 01/01/2019 15:01:12 [name] ([id number]) -------------------------------------------------------------------------------- Patient/Caregiver Education Details Patient Name: [name]. Date of Service: 01/01/2019 3:00 PM Medical Record Number: [id number] Patient Account Number: [id number] Date of Birth/Gender: 03/27/1935 (84 y.o. F) Treating RN: Cornell Barman Primary Care Physician: Einar Pheasant Other Clinician: Referring Physician: Einar Pheasant Treating Physician/Extender: Tito Dine in Treatment: 3 Education Assessment Education Provided To: Patient Education Topics Provided Venous: Wound/Skin Impairment: Handouts: Caring for Your Ulcer, Caring for Your Ulcer Spanish Methods: Demonstration, Explain/Verbal Responses: State content correctly Electronic Signature(s) Signed: 01/01/2019 5:55:20 PM By: Gretta Cool, BSN, RN, CWS, Kim RN, BSN Entered By: Gretta Cool, BSN, RN, CWS, Kim on 01/01/2019 15:44:33 [name] ([id number]) -------------------------------------------------------------------------------- Wound Assessment Details Patient Name: Casimer Leek  T. Date of Service: 01/01/2019 3:00 PM Medical Record Number: [id number] Patient Account Number: [id number] Date of Birth/Sex: July 08, 1934 (84 y.o. F) Treating RN: Montey Hora Primary Care Javis Abboud: Einar Pheasant Other Clinician: Referring Marjon Doxtater: Einar Pheasant Treating Marbin Olshefski/Extender: Tito Dine in Treatment: 3 Wound Status Wound Number: 1 Primary Skin Tear Etiology: Wound Location: Right Lower Leg - Anterior Wound Open Wounding Event: Trauma Status: Date Acquired: 12/06/2018 Comorbid Anemia, Chronic Obstructive  Pulmonary Disease Weeks Of Treatment: 3 History: (COPD), Congestive Heart Failure, Coronary Clustered Wound: No Artery Disease, Hypertension, Received Chemotherapy, Received Radiation Photos Wound Measurements Length: (cm) 6.4 Width: (cm) 6.3 Depth: (cm) 0.1 Area: (cm) 31.667 Volume: (cm) 3.167 % Reduction in Area: 27% % Reduction in Volume: 27% Epithelialization: Small (1-33%) Tunneling: No Undermining: No Wound Description Full Thickness Without Exposed Support Classification: Structures Wound Margin: Flat and Intact Exudate Large Amount: Exudate Type: Sanguinous Exudate Color: red Foul Odor After Cleansing: No Slough/Fibrino No Wound Bed Granulation Amount: Large (67-100%) Exposed Structure Granulation Quality: Red Fascia Exposed: No Necrotic Amount: Small (1-33%) Fat Layer (Subcutaneous Tissue) Exposed: Yes Necrotic Quality: Adherent Slough Tendon Exposed: No Muscle Exposed: No Joint Exposed: No Bone Exposed: No Kyllo, Shanira T. ([id number]) Treatment Notes Wound #1 (Right, Anterior Lower Leg) Notes silver alginate, ABD, coban, tubigrip Electronic Signature(s) Signed: 01/01/2019 4:23:45 PM By: Montey Hora Entered By: Montey Hora on 01/01/2019 15:11:53 [name] ([id number]) -------------------------------------------------------------------------------- Vitals Details Patient Name: [name] Date of Service: 01/01/2019 3:00 PM Medical Record Number: [id number] Patient Account Number: [id number] Date of Birth/Sex: 04-08-1935 (84 y.o. F) Treating RN: Montey Hora Primary Care Mateen Franssen: Einar Pheasant Other Clinician: Referring Dajanee Voorheis: Einar Pheasant Treating Rayshad Riviello/Extender: Tito Dine in Treatment: 3 Vital Signs Time Taken: 15:01 Temperature (F): 98.8 Height (in): 65 Pulse (bpm): 69 Weight (lbs): 138 Respiratory Rate (breaths/min): 16 Body Mass Index (BMI): 23 Blood Pressure (mmHg): 156/84 Reference Range: 80  - 120 mg / dl Electronic Signature(s) Signed: 01/01/2019 4:23:45 PM By: Montey Hora Entered By: Montey Hora on 01/01/2019 15:10:09

## 2019-01-02 NOTE — Progress Notes (Signed)
Tina [name], Tina [name] ([id number]) Visit Report for 01/01/2019 HPI Details Patient Name: Tina [name], Tina [name]. Date of Service: 01/01/2019 3:00 PM Medical Record Number: [id number] Patient Account Number: [id number] Date of Birth/Sex: Jun 21, 1934 (84 y.o. F) Treating RN: Tina [name] Primary Care Provider: Einar [name] Other Clinician: Referring Provider: Einar [name] Treating Provider/Extender: Tina [name] in Treatment: 3 History of Present Illness HPI Description: ADMISSION 12/11/2018 This is an 84 year old woman with multiple medical problems. She was in the ER on 7/25 after falling while going up brick stairs. She had a fairly substantial skin tear on her right anterior leg. The skin was reattached with Steri-Strips. She was told that it would "fall off". They put Curlex and Coban on this. She has had previous wounds on the left anterior leg that she was able to get the heel roughly 3 years ago. 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 8/5-Returns at 1 week right lacerated wound on the right anterior leg. Today the wound has the skin flap on it with hematoma below the skin flap line loosely on top. 8/12-Patient returns at 1 week after the skin flap and hematoma were addressed, we put her in 2 layer wrap and today the leg looks swollen and red, patient also complaining of some discomfort in that area 8/19; this is a patient with chronic venous insufficiency with stasis dermatitis and very fragile skin. She arrived with a substantial skin tear on the right anterior lower leg with subcutaneous hematoma. Eventually this was evacuated or evacuated on its own and I am not really sure. In any case the patient  arrives with a reasonably healthy looking wound on the right anterior mid tibia area. She is supposed to be using a contact layer and silver alginate her daughter is apparently changing this every second day. I do not think she is putting any compression on this. She certainly needs this however in spite of this the wound looks quite a bit better than when I saw this 3 weeks ago Electronic Signature(s) Signed: 01/01/2019 5:33:48 PM By: Tina Ham MD Entered By: Tina [name] on 01/01/2019 17:13:26 Tina [name] ([id number]) -------------------------------------------------------------------------------- Physical Exam Details Patient Name: Tina Leek T. Date of Service: 01/01/2019 3:00 PM Medical Record Number: [id number] Patient Account Number: [id number] Date of Birth/Sex: 30-Nov-1934 (84 y.o. F) Treating RN: Tina [name] Primary Care Provider: Einar [name] Other Clinician: Referring Provider: Einar [name] Treating Provider/Extender: Tina [name] in Treatment: 3 Constitutional Patient is hypertensive.. Pulse regular and within target range for patient.Marland Kitchen Respirations regular, non-labored and within target range.. Temperature is normal and within the target range for the patient.Marland Kitchen appears in no distress. Eyes Conjunctivae clear. No discharge. Respiratory Respiratory effort is easy and symmetric bilaterally. Rate is normal at rest and on room air.. Cardiovascular Pedal pulses are palpable. Significant edema persists in the right lower extremity. Lymphatic None palpable in the popliteal area. Integumentary (Hair, Skin) The patient has severe chronic venous insufficiency changes with marked hemosiderin in the right lower leg. Very fragile skin. She has marginal edema control. Notes Wound exam; right anterior lower leg wound. There is no remanent of the hematoma. The wound is healthy looking. The wound was washed with saline and gauze. The surface looks healthy.  There is advancing epithelialization. No debridement is required Electronic Signature(s) Signed: 01/01/2019 5:33:48 PM By: Tina Ham MD Entered By: Tina [name] on 01/01/2019 17:16:39 Tina [name] ([id number]) -------------------------------------------------------------------------------- Physician Orders Details Patient Name: Tina Leek T. Date of Service: 01/01/2019 3:00 PM Medical Record Number: [id number] Patient Account Number: [id number] Date of Birth/Sex: 10-18-1934 (84 y.o. F) Treating RN: Tina [name] Primary Care Provider: Einar [name] Other Clinician: Referring Provider: Einar [name] Treating Provider/Extender: Tina [name] in Treatment: 3 Verbal / Phone Orders: No Diagnosis Coding Wound Cleansing Wound #1 Right,Anterior Lower Leg o Cleanse wound with mild soap and water o Dial antibacterial soap, wash wounds, rinse and pat dry prior to dressing wounds o May Shower, gently pat wound dry prior to applying new dressing. Anesthetic (add to Medication List) Wound #1 Right,Anterior Lower Leg o Topical Lidocaine 4% cream applied to wound bed prior to debridement (In Clinic Only). Primary Wound Dressing Wound #1 Right,Anterior Lower Leg o Mepitel One Contact layer Secondary Dressing Wound #1 Right,Anterior Lower Leg o ABD and Kerlix/Conform o Silver Alginate Dressing Change Frequency Wound #1 Right,Anterior Lower Leg o Change dressing every other day. Follow-up Appointments Wound #1 Right,Anterior Lower Leg o Return Appointment in 1 week. Edema Control Wound #1 Right,Anterior Lower Leg o Other: - Tubigrip F Electronic Signature(s) Signed: 01/01/2019 5:33:48 PM By: Tina Ham MD Signed: 01/01/2019 5:55:20 PM By: Tina [name], BSN, RN, CWS, Kim RN, BSN Entered By: Tina [name], BSN, RN, CWS, Kim on 01/01/2019 15:42:22 Tina [name] ([id number]) -------------------------------------------------------------------------------- Problem  List Details Patient Name: Tina [name], Tina [name]. Date of Service: 01/01/2019 3:00 PM Medical Record Number: [id number] Patient Account Number: [id number] Date of Birth/Sex: 1935/05/09 (84 y.o. F) Treating RN: Tina [name] Primary Care Provider: Einar [name] Other Clinician: Referring Provider: Einar [name] Treating Provider/Extender: Tina [name] in Treatment: 3 Active Problems ICD-10 Evaluated Encounter Code Description Active Date Today Diagnosis S80.11XD Contusion of right lower leg, subsequent encounter 12/11/2018 No Yes L97.818 Non-pressure chronic ulcer of other part of right lower leg 12/11/2018 No Yes with other specified severity I87.331 Chronic venous hypertension (idiopathic) with ulcer and 12/11/2018 No Yes inflammation of right lower extremity Inactive Problems Resolved Problems Electronic Signature(s) Signed: 01/01/2019 5:33:48 PM By: Tina Ham MD Entered By: Tina [name] on 01/01/2019 17:11:39 Tina [name] ([id number]) -------------------------------------------------------------------------------- Progress Note Details Patient Name: Tina Leek T. Date of Service: 01/01/2019 3:00 PM Medical Record Number: [id number] Patient Account Number: [id number] Date of Birth/Sex: 19-Oct-1934 (84 y.o. F) Treating RN: Tina [name] Primary Care Provider: Einar [name] Other Clinician: Referring Provider: Einar [name] Treating Provider/Extender: Tina [name] in Treatment: 3 Subjective History of Present Illness (HPI) ADMISSION 12/11/2018 This is an 84 year old woman with multiple medical problems. She was in the ER on 7/25 after falling while going up brick stairs. She had a fairly substantial skin tear on her right anterior leg. The skin was reattached with Steri-Strips. She was told that it would "fall off". They put Curlex and Coban on this. She has had previous wounds on the left anterior leg that she was able to get the heel roughly 3 years  ago. 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 8/5-Returns at 1 week right lacerated wound on the right anterior leg.  Today the wound has the skin flap on it with hematoma below the skin flap line loosely on top. 8/12-Patient returns at 1 week after the skin flap and hematoma were addressed, we put her in 2 layer wrap and today the leg looks swollen and red, patient also complaining of some discomfort in that area 8/19; this is a patient with chronic venous insufficiency with stasis dermatitis and very fragile skin. She arrived with a substantial skin tear on the right anterior lower leg with subcutaneous hematoma. Eventually this was evacuated or evacuated on its own and I am not really sure. In any case the patient arrives with a reasonably healthy looking wound on the right anterior mid tibia area. She is supposed to be using a contact layer and silver alginate her daughter is apparently changing this every second day. I do not think she is putting any compression on this. She certainly needs this however in spite of this the wound looks quite a bit better than when I saw this 3 weeks ago Objective Constitutional Patient is hypertensive.. Pulse regular and within target range for patient.Marland Kitchen Respirations regular, non-labored and within target range.. Temperature is normal and within the target range for the patient.Marland Kitchen appears in no distress. Vitals Time Taken: 3:01 PM, Height: 65 in, Weight: 138 lbs, BMI: 23, Temperature: 98.8 F, Pulse: 69 bpm, Respiratory Rate: 16 breaths/min, Blood Pressure: 156/84 mmHg. Eyes Conjunctivae clear. No discharge. Tina [name], Tina [name] Kitchen ([id number]) Respiratory Respiratory effort is  easy and symmetric bilaterally. Rate is normal at rest and on room air.. Cardiovascular Pedal pulses are palpable. Significant edema persists in the right lower extremity. Lymphatic None palpable in the popliteal area. General Notes: Wound exam; right anterior lower leg wound. There is no remanent of the hematoma. The wound is healthy looking. The wound was washed with saline and gauze. The surface looks healthy. There is advancing epithelialization. No debridement is required Integumentary (Hair, Skin) The patient has severe chronic venous insufficiency changes with marked hemosiderin in the right lower leg. Very fragile skin. She has marginal edema control. Wound #1 status is Open. Original cause of wound was Trauma. The wound is located on the Right,Anterior Lower Leg. The wound measures 6.4cm length x 6.3cm width x 0.1cm depth; 31.667cm^2 area and 3.167cm^3 volume. There is Fat Layer (Subcutaneous Tissue) Exposed exposed. There is no tunneling or undermining noted. There is a large amount of sanguinous drainage noted. The wound margin is flat and intact. There is large (67-100%) red granulation within the wound bed. There is a small (1-33%) amount of necrotic tissue within the wound bed including Adherent Slough. Assessment Active Problems ICD-10 Contusion of right lower leg, subsequent encounter Non-pressure chronic ulcer of other part of right lower leg with other specified severity Chronic venous hypertension (idiopathic) with ulcer and inflammation of right lower extremity Plan Wound Cleansing: Wound #1 Right,Anterior Lower Leg: Cleanse wound with mild soap and water Dial antibacterial soap, wash wounds, rinse and pat dry prior to dressing wounds May Shower, gently pat wound dry prior to applying new dressing. Anesthetic (add to Medication List): Wound #1 Right,Anterior Lower Leg: Topical Lidocaine 4% cream applied to wound bed prior to debridement (In Clinic Only). Primary  Wound Dressing: Wound #1 Right,Anterior Lower Leg: Mepitel One Contact layer Secondary Dressing: Wound #1 Right,Anterior Lower Leg: ABD and Kerlix/Conform Silver Alginate Oborn, Merly T. ([id number]) Dressing Change Frequency: Wound #1 Right,Anterior Lower Leg: Change dressing every other day. Follow-up Appointments: Wound #1 Right,Anterior Lower Leg: Return Appointment in  1 week. Edema Control: Wound #1 Right,Anterior Lower Leg: Other: - Tubigrip F 1. The wound looks a lot better. 2. The patient is resistant to any form of compression. Nevertheless we use Curlex and conform over the silver alginate Electronic Signature(s) Signed: 01/01/2019 5:33:48 PM By: Tina Ham MD Entered By: Tina [name] on 01/01/2019 17:17:55 Tina [name] ([id number]) -------------------------------------------------------------------------------- SuperBill Details Patient Name: Tina Leek T. Date of Service: 01/01/2019 Medical Record Number: [id number] Patient Account Number: [id number] Date of Birth/Sex: [birth year] (84 y.o. F) Treating RN: Tina [name] Primary Care Provider: Einar [name] Other Clinician: Referring Provider: Einar [name] Treating Provider/Extender: Tina [name] in Treatment: 3 Diagnosis Coding ICD-10 Codes Code Description S80.11XD Contusion of right lower leg, subsequent encounter L97.818 Non-pressure chronic ulcer of other part of right lower leg with other specified severity I87.331 Chronic venous hypertension (idiopathic) with ulcer and inflammation of right lower extremity Facility Procedures CPT4 Code: 01410301 Description: 99213 - WOUND CARE VISIT-LEV 3 EST PT Modifier: Quantity: 1 Physician Procedures CPT4: Description Modifier Quantity Code [id number] 99213 - WC PHYS LEVEL 3 - EST PT 1 ICD-10 Diagnosis Description S80.11XD Contusion of right lower leg, subsequent encounter L97.818 Non-pressure chronic ulcer of other part of right lower leg with other   specified severity I87.331 Chronic venous hypertension (idiopathic) with ulcer and inflammation of right lower extremity Electronic Signature(s) Signed: 01/01/2019 5:33:48 PM By: Tina Ham MD Entered By: Tina [name] on 01/01/2019 17:18:19

## 2019-01-03 ENCOUNTER — Ambulatory Visit (INDEPENDENT_AMBULATORY_CARE_PROVIDER_SITE_OTHER): Payer: Medicare Other | Admitting: Nurse Practitioner

## 2019-01-03 ENCOUNTER — Other Ambulatory Visit: Payer: Self-pay

## 2019-01-03 ENCOUNTER — Encounter (INDEPENDENT_AMBULATORY_CARE_PROVIDER_SITE_OTHER): Payer: Self-pay | Admitting: Nurse Practitioner

## 2019-01-03 VITALS — BP 197/70 | HR 71 | Resp 10 | Ht 64.0 in | Wt 138.0 lb

## 2019-01-03 DIAGNOSIS — K55059 Acute (reversible) ischemia of intestine, part and extent unspecified: Secondary | ICD-10-CM

## 2019-01-03 DIAGNOSIS — R29898 Other symptoms and signs involving the musculoskeletal system: Secondary | ICD-10-CM

## 2019-01-03 DIAGNOSIS — K551 Chronic vascular disorders of intestine: Secondary | ICD-10-CM

## 2019-01-03 DIAGNOSIS — J410 Simple chronic bronchitis: Secondary | ICD-10-CM | POA: Diagnosis not present

## 2019-01-03 DIAGNOSIS — I10 Essential (primary) hypertension: Secondary | ICD-10-CM | POA: Diagnosis not present

## 2019-01-06 ENCOUNTER — Encounter (INDEPENDENT_AMBULATORY_CARE_PROVIDER_SITE_OTHER): Payer: Self-pay | Admitting: Nurse Practitioner

## 2019-01-06 NOTE — Progress Notes (Signed)
SUBJECTIVE:  [name], female    DOB: 1935/04/20, 84 y.o.   MRN: [id number] Chief Complaint  Patient presents with  . Follow-up    HPI  Tina [name] is a 84 y.o. female that presents today on referral by Dr. Saralyn Pilar with concerns for peripheral artery disease.  Patient states that she has extreme weakness of her lower extremities.  However she endorses that is not just lower extremities but just extreme weakness overall.  She states that when she tries to get up and do anything she can only go for a few short moments before she needs to sit down and relax.  The patient previously had a mesenteric angiogram done on 02/12/2017 with placement of a stent to superior mesenteric artery.  Previous duplex showed a greater than 50% stenosis in the mid to proximal abdominal aorta.  Patient also had evidence of an occluded celiac artery, which was consistent with the angiogram.  Today the patient denies any nausea, vomiting or bowel changes.  She denies any abdominal pain or food phobia.  The patient states that the last time she felt like this she found that she needed to have a stent placed within her mesenteric artery.  The patient has not had a duplex of her mesenteric arteries since 03/21/2017.  Past Medical History:  Diagnosis Date  . Anemia   . Asthma   . Bladder cancer ([place])   . BRCA positive   . CAD (coronary artery disease)   . CHF (congestive heart failure) ([place])   . COPD (chronic obstructive pulmonary disease) ([place])   . Emphysema lung ([place])   . GERD (gastroesophageal reflux disease)   . History of colon polyps   . Hypercholesterolemia   . Hyperglycemia   . Hypertension   . Lung nodules   . Osteoporosis   . Personal history of tobacco use, presenting hazards to health 11/25/2014  . Polycythemia vera(238.4)   . Renal cyst   . Skin cancer     Past Surgical History:  Procedure Laterality Date  . ANGIOPLASTY     coronary (x1)  . COLONOSCOPY WITH PROPOFOL N/A  03/02/2015   Procedure: COLONOSCOPY WITH PROPOFOL;  Surgeon: Lollie Sails, MD;  Location: [facility] ENDOSCOPY;  Service: Endoscopy;  Laterality: N/A;  . COLONOSCOPY WITH PROPOFOL N/A 02/13/2017   Procedure: COLONOSCOPY WITH PROPOFOL;  Surgeon: [name], MD;  Location: [facility] ENDOSCOPY;  Service: Endoscopy;  Laterality: N/A;  . CORONARY ANGIOPLASTY    . CORONARY ARTERY BYPASS GRAFT  09/17/2017   pt denies  . CYSTOSCOPY W/ RETROGRADES Bilateral 02/13/2017   Procedure: CYSTOSCOPY WITH RETROGRADE PYELOGRAM;  Surgeon: Hollice Espy, MD;  Location: [facility] ORS;  Service: Urology;  Laterality: Bilateral;  . CYSTOSCOPY W/ RETROGRADES Bilateral 09/19/2017   Procedure: CYSTOSCOPY WITH RETROGRADE PYELOGRAM;  Surgeon: Hollice Espy, MD;  Location: [facility] ORS;  Service: Urology;  Laterality: Bilateral;  . CYSTOSCOPY WITH BIOPSY  03/26/2017   Procedure: CYSTOSCOPY WITH BIOPSY;  Surgeon: Hollice Espy, MD;  Location: [facility] ORS;  Service: Urology;;  . Consuela Mimes WITH STENT PLACEMENT Bilateral 02/13/2017   Procedure: CYSTOSCOPY WITH STENT PLACEMENT;  Surgeon: Hollice Espy, MD;  Location: [facility] ORS;  Service: Urology;  Laterality: Bilateral;  . CYSTOSCOPY WITH STENT PLACEMENT Bilateral 03/26/2017   Procedure: CYSTOSCOPY WITH STENT EXCHANGE;  Surgeon: Hollice Espy, MD;  Location: [facility] ORS;  Service: Urology;  Laterality: Bilateral;  . ESOPHAGOGASTRODUODENOSCOPY (EGD) WITH PROPOFOL N/A 02/13/2017   Procedure: ESOPHAGOGASTRODUODENOSCOPY (EGD) WITH PROPOFOL;  Surgeon: [name],  MD;  Location: [facility] ENDOSCOPY;  Service: Endoscopy;  Laterality: N/A;  . PORTA CATH INSERTION N/A 02/28/2017   Procedure: PORTA CATH INSERTION;  Surgeon: Algernon Huxley, MD;  Location: [place] CV LAB;  Service: Cardiovascular;  Laterality: N/A;  . PORTA CATH REMOVAL N/A 12/24/2017   Procedure: PORTA CATH REMOVAL;  Surgeon: Algernon Huxley, MD;  Location: [place] CV LAB;  Service: Cardiovascular;  Laterality: N/A;  .  SALPINGOOPHORECTOMY    . TONSILECTOMY/ADENOIDECTOMY WITH MYRINGOTOMY    . TONSILLECTOMY    . TRANSURETHRAL RESECTION OF BLADDER TUMOR N/A 02/13/2017   Procedure: TRANSURETHRAL RESECTION OF BLADDER TUMOR (TURBT);  Surgeon: Hollice Espy, MD;  Location: [facility] ORS;  Service: Urology;  Laterality: N/A;  . TUBAL LIGATION    . UPPER GI ENDOSCOPY  02/13/2017  . URETEROSCOPY Left 09/19/2017   Procedure: URETEROSCOPY;  Surgeon: Hollice Espy, MD;  Location: [facility] ORS;  Service: Urology;  Laterality: Left;  Marland Kitchen VISCERAL ARTERY INTERVENTION N/A 02/12/2017   Procedure: VISCERAL ARTERY INTERVENTION;  Surgeon: Algernon Huxley, MD;  Location: [place] [facility] LAB;  Service: Cardiovascular;  Laterality: N/A;    Social History   Socioeconomic History  . Marital status: Widowed    Spouse name: Not on file  . Number of children: 2  . Years of education: Not on file  . Highest education level: Not on file  Occupational History  . Not on file  Social Needs  . Financial resource strain: Not hard at all  . Food insecurity    Worry: Never true    Inability: Never true  . Transportation needs    Medical: No    Non-medical: No  Tobacco Use  . Smoking status: Former Smoker    Packs/day: 1.00    Years: 45.00    Pack years: 45.00    Types: Cigarettes    Quit date: 11/11/2003    Years since quitting: 15.1  . Smokeless tobacco: Never Used  Substance and Sexual Activity  . Alcohol use: No    Alcohol/week: 0.0 standard drinks    Comment: occasional  . Drug use: No  . Sexual activity: Never  Lifestyle  . Physical activity    Days per week: Not on file    Minutes per session: Not on file  . Stress: Not at all  Relationships  . Social Herbalist on phone: Not on file    Gets together: Not on file    Attends religious service: Not on file    Active member of club or organization: Not on file    Attends meetings of clubs or organizations: Not on file    Relationship status: Not on file  .  Intimate partner violence    Fear of current or ex partner: No    Emotionally abused: No    Physically abused: No    Forced sexual activity: No  Other Topics Concern  . Not on file  Social History Narrative  . Not on file    Family History  Problem Relation Age of Onset  . Cirrhosis Father        died age 51  . Alcohol abuse Father   . Asthma Mother   . Congestive Heart Failure Mother   . Breast cancer Mother        2 (1/2 sisters)  . Osteoarthritis Mother   . Colon cancer Mother   . Lupus Sister   . Alcohol abuse Sister   . Ovarian cancer Sister   .  Osteoporosis Sister   . Skin cancer Sister   . Breast cancer Cousin   . Breast cancer Maternal Aunt     Allergies  Allergen Reactions  . Evista [Raloxifene] Other (See Comments)    Night sweats   . Fluticasone-Salmeterol Other (See Comments)    Cough, "chokes me"      Review of Systems   Review of Systems: Negative Unless Checked Constitutional: '[]' Weight loss  '[]' Fever  '[]' Chills Cardiac: '[]' Chest pain   '[]'  Atrial Fibrillation  '[]' Palpitations   '[]' Shortness of breath when laying flat   '[]' Shortness of breath with exertion. '[]' Shortness of breath at rest Vascular:  '[]' Pain in legs with walking   '[]' Pain in legs with standing '[]' Pain in legs when laying flat   '[]' Claudication    '[]' Pain in feet when laying flat    '[]' History of DVT   '[]' Phlebitis   '[]' Swelling in legs   '[]' Varicose veins   '[]' Non-healing ulcers Pulmonary:   '[]' Uses home oxygen   '[]' Productive cough   '[]' Hemoptysis   '[]' Wheeze  '[x]' COPD   '[]' Asthma Neurologic:  '[]' Dizziness   '[]' Seizures  '[]' Blackouts '[]' History of stroke   '[]' History of TIA  '[]' Aphasia   '[]' Temporary Blindness   '[]' Weakness or numbness in arm   '[x]' Weakness or numbness in leg Musculoskeletal:   '[]' Joint swelling   '[]' Joint pain   '[]' Low back pain  '[]'  History of Knee Replacement '[]' Arthritis '[]' back Surgeries  '[]'  Spinal Stenosis    Hematologic:  '[]' Easy bruising  '[]' Easy bleeding   '[]' Hypercoagulable state   '[x]' Anemic  Gastrointestinal:  '[]' Diarrhea   '[]' Vomiting  '[]' Gastroesophageal reflux/heartburn   '[]' Difficulty swallowing. '[]' Abdominal pain Genitourinary:  '[x]' Chronic kidney disease   '[]' Difficult urination  '[]' Anuric   '[]' Blood in urine '[]' Frequent urination  '[]' Burning with urination   '[]' Hematuria Skin:  '[]' Rashes   '[]' Ulcers '[]' Wounds Psychological:  '[]' History of anxiety   '[]'  History of major depression  '[]'  Memory Difficulties      OBJECTIVE:   Physical Exam  BP (!) 197/70 (BP Location: Left Arm, Patient Position: Sitting, Cuff Size: Normal)   Pulse 71   Resp 10   Ht '5\' 4"'  (1.626 m)   Wt 138 lb (62.6 kg)   BMI 23.69 kg/m   Gen: WD/WN, NAD Head: [place]/AT, No temporalis wasting.  Ear/Nose/Throat: Hearing grossly intact, nares w/o erythema or drainage Eyes: PER, EOMI, sclera nonicteric.  Neck: Supple, no masses.  No JVD.  Pulmonary:  Good air movement, no use of accessory muscles.  Cardiac: RRR Vascular:  Vessel Right Left  Radial Palpable Palpable  Dorsalis Pedis Palpable Palpable  Posterior Tibial Palpable Palpable   Gastrointestinal: soft, non-distended. No guarding/no peritoneal signs.  Musculoskeletal: M/S 5/5 throughout.  No deformity or atrophy.  Neurologic: Pain and light touch intact in extremities.  Symmetrical.  Speech is fluent. Motor exam as listed above. Psychiatric: Judgment intact, Mood & affect appropriate for pt's clinical situation. Dermatologic: No Venous rashes. No Ulcers Noted.  No changes consistent with cellulitis. Lymph : No Cervical lymphadenopathy, no lichenification or skin changes of chronic lymphedema.       ASSESSMENT AND PLAN:  1. Mesenteric ischemia due to arterial insufficiency [facility]) Patient had a similar weakness prior to needing stent placement within her mesenteric arteries.  Patient last followed up in November 2018 regarding her mesenteric stent.  We will go obtain a duplex to assess stent patency. - VAS [name] MESENTERIC; Future  2. Weakness of both legs   Recommend:  The patient has atypical pain symptoms for pure atherosclerotic disease. However,  on physical exam there is evidence of mixed venous and arterial disease, given the diminished pulses and the edema associated with venous changes of the legs.  Noninvasive studies including ABI's of the legs will be obtained and the patient will follow up with me to review these studies.  The patient should continue walking and begin a more formal exercise program. The patient should continue his antiplatelet therapy and aggressive treatment of the lipid abnormalities.  The patient should begin wearing graduated compression socks 15-20 mmHg strength to control edema.  - VAS [name] ABI WITH/WO TBI; Future  3. Essential hypertension Continue antihypertensive medications as already ordered, these medications have been reviewed and there are no changes at this time.   4. Simple chronic bronchitis (HCC) Continue pulmonary medications and aerosols as already ordered, these medications have been reviewed and there are no changes at this time.     Current Outpatient Medications on File Prior to Visit  Medication Sig Dispense Refill  . albuterol (PROVENTIL HFA;VENTOLIN HFA) 108 (90 Base) MCG/ACT inhaler Inhale 2 puffs into the lungs every 6 (six) hours as needed for wheezing or shortness of breath. 1 Inhaler 2  . amLODipine (NORVASC) 5 MG tablet TAKE 1 TABLET BY MOUTH TWO  TIMES DAILY 180 tablet 1  . cholecalciferol (VITAMIN D) 1000 units tablet Take 1,000 Units by mouth daily.    . clopidogrel (PLAVIX) 75 MG tablet Take 1 tablet (75 mg total) by mouth daily. 30 tablet 0  . fluticasone (FLONASE) 50 MCG/ACT nasal spray USE 2 SPRAYS INTO BOTH  NOSTRILS DAILY AS NEEDED  FOR ALLERGIES. 48 g 1  . hydrALAZINE (APRESOLINE) 25 MG tablet Take 1 tablet (25 mg total) by mouth 3 (three) times daily. 90 tablet 2  . ipratropium-albuterol (DUONEB) 0.5-2.5 (3) MG/3ML SOLN Take 3 mLs by nebulization every 6 (six) hours as  needed (for shortness of breath). 36 mL 0  . losartan (COZAAR) 100 MG tablet TAKE 1 TABLET BY MOUTH ONCE A DAY 90 tablet 1  . lovastatin (MEVACOR) 40 MG tablet TAKE 1 TABLET BY MOUTH  DAILY IN THE EVENING 90 tablet 1  . metoprolol tartrate (LOPRESSOR) 25 MG tablet TAKE 1 TABLET BY MOUTH  TWICE A DAY 180 tablet 1  . potassium chloride (K-DUR) 10 MEQ tablet TAKE 1 TABLET BY MOUTH  DAILY 90 tablet 1  . SYMBICORT 160-4.5 MCG/ACT inhaler USE 2 PUFFS TWO TIMES DAILY 30.6 g 3  . torsemide (DEMADEX) 20 MG tablet Take 20 mg by mouth every morning.  11  . OVER THE COUNTER MEDICATION daily.     Current Facility-Administered Medications on File Prior to Visit  Medication Dose Route Frequency Provider Last Rate Last Dose  . heparin lock flush 100 unit/mL  500 Units Intravenous Once Sindy Guadeloupe, MD      . sodium chloride flush (NS) 0.9 % injection 10 mL  10 mL Intravenous PRN Sindy Guadeloupe, MD        There are no Patient Instructions on file for this visit. No follow-ups on file.   Kris Hartmann, NP  This note was completed with Sales executive.  Any errors are purely unintentional.

## 2019-01-07 ENCOUNTER — Other Ambulatory Visit: Payer: Self-pay

## 2019-01-07 ENCOUNTER — Encounter (INDEPENDENT_AMBULATORY_CARE_PROVIDER_SITE_OTHER): Payer: Self-pay | Admitting: Nurse Practitioner

## 2019-01-07 ENCOUNTER — Ambulatory Visit (INDEPENDENT_AMBULATORY_CARE_PROVIDER_SITE_OTHER): Payer: Medicare Other

## 2019-01-07 ENCOUNTER — Ambulatory Visit (INDEPENDENT_AMBULATORY_CARE_PROVIDER_SITE_OTHER): Payer: Medicare Other | Admitting: Nurse Practitioner

## 2019-01-07 VITALS — BP 195/73 | HR 71 | Resp 16 | Ht 65.0 in | Wt 138.0 lb

## 2019-01-07 DIAGNOSIS — R29898 Other symptoms and signs involving the musculoskeletal system: Secondary | ICD-10-CM | POA: Diagnosis not present

## 2019-01-07 DIAGNOSIS — I10 Essential (primary) hypertension: Secondary | ICD-10-CM

## 2019-01-07 DIAGNOSIS — J410 Simple chronic bronchitis: Secondary | ICD-10-CM

## 2019-01-07 DIAGNOSIS — K55059 Acute (reversible) ischemia of intestine, part and extent unspecified: Secondary | ICD-10-CM

## 2019-01-07 DIAGNOSIS — I700 Atherosclerosis of aorta: Secondary | ICD-10-CM

## 2019-01-07 DIAGNOSIS — K551 Chronic vascular disorders of intestine: Secondary | ICD-10-CM

## 2019-01-08 ENCOUNTER — Telehealth (INDEPENDENT_AMBULATORY_CARE_PROVIDER_SITE_OTHER): Payer: Self-pay

## 2019-01-08 ENCOUNTER — Encounter: Payer: Medicare Other | Admitting: Internal Medicine

## 2019-01-08 DIAGNOSIS — S8011XA Contusion of right lower leg, initial encounter: Secondary | ICD-10-CM | POA: Diagnosis not present

## 2019-01-08 DIAGNOSIS — J449 Chronic obstructive pulmonary disease, unspecified: Secondary | ICD-10-CM | POA: Diagnosis not present

## 2019-01-08 DIAGNOSIS — L03115 Cellulitis of right lower limb: Secondary | ICD-10-CM | POA: Diagnosis not present

## 2019-01-08 DIAGNOSIS — I87331 Chronic venous hypertension (idiopathic) with ulcer and inflammation of right lower extremity: Secondary | ICD-10-CM | POA: Diagnosis not present

## 2019-01-08 DIAGNOSIS — E785 Hyperlipidemia, unspecified: Secondary | ICD-10-CM | POA: Diagnosis not present

## 2019-01-08 DIAGNOSIS — S81801A Unspecified open wound, right lower leg, initial encounter: Secondary | ICD-10-CM | POA: Diagnosis not present

## 2019-01-08 DIAGNOSIS — S81811A Laceration without foreign body, right lower leg, initial encounter: Secondary | ICD-10-CM | POA: Diagnosis not present

## 2019-01-08 NOTE — Progress Notes (Signed)
Tina, [name] ([id number]) Visit Report for 01/08/2019 HPI Details Patient Name: Tina [name], Tina [name]. Date of Service: 01/08/2019 3:45 PM Medical Record Number: [id number] Patient Account Number: [id number] Date of Birth/Sex: [birth year] (84 y.o. F) Treating RN: Harold Barban Primary Care Provider: Einar Pheasant Other Clinician: Referring Provider: Einar Pheasant Treating Provider/Extender: Tito Dine in Treatment: 4 History of Present Illness HPI Description: ADMISSION 12/11/2018 This is an 84 year old woman with multiple medical problems. She was in the ER on 7/25 after falling while going up brick stairs. She had a fairly substantial skin tear on her right anterior leg. The skin was reattached with Steri-Strips. She was told that it would "fall off". They put Curlex and Coban on this. She has had previous wounds on the left anterior leg that she was able to get the heel roughly 3 years ago. 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 8/5-Returns at 1 week right lacerated wound on the right anterior leg. Today the wound has the skin flap on it with hematoma below the skin flap line loosely on top. 8/12-Patient returns at 1 week after the skin flap and hematoma were addressed, we put her in 2 layer wrap and today the leg looks swollen and red, patient also complaining of some discomfort in that area 8/19; this is a patient with chronic venous insufficiency with stasis dermatitis and very fragile skin. She arrived with a substantial skin tear on the right anterior lower leg with subcutaneous hematoma. Eventually this was evacuated or evacuated on its own and I am not really sure. In any case the  patient arrives with a reasonably healthy looking wound on the right anterior mid tibia area. She is supposed to be using a contact layer and silver alginate her daughter is apparently changing this every second day. I do not think she is putting any compression on this. She certainly needs this however in spite of this the wound looks quite a bit better than when I saw this 3 weeks ago 8/26; the patient is making gradual progress with this wound with [place] in [place] as of epithelialization. He is using a contact layer silver alginate ABDs. She did not tolerate any form of compression. She talk to [name] about going to see vein and vascular relating to intra-abdominal issues. I had a quick look at this. 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 I do not think this has anything to do with our issues. Electronic Signature(s) Signed: 01/08/2019 4:56:01 PM By: Linton Ham MD Entered By: Linton Ham on 01/08/2019 16:19:37 Tina [name] ([id number]) -------------------------------------------------------------------------------- Physical Exam Details Patient Name: Tina [name]. Date of Service: 01/08/2019 3:45 PM Medical Record Number: [id number] Patient Account Number: [id number] Date of Birth/Sex: [birth year] (84 y.o. F) Treating RN: Harold Barban Primary Care Provider: Einar Pheasant Other Clinician: Referring Provider: Einar Pheasant Treating Provider/Extender: Tito Dine in Treatment: 4 Constitutional Patient is hypertensive.. Pulse regular and within target range for patient.Marland Kitchen Respirations regular, non-labored and within target range.. Temperature is normal and within the target range for the patient.Marland Kitchen appears in no distress. Eyes Conjunctivae clear. No discharge. Respiratory Respiratory effort is easy  and symmetric bilaterally. Rate is normal at rest and on room  air.. Cardiovascular Pedal pulses palpable and strong bilaterally.. Edema present in both extremities. Changes of chronic venous. Lymphatic None palpable in the popliteal area bilaterally. Integumentary (Hair, Skin) Changes of chronic venous insufficiency. Psychiatric No evidence of depression, anxiety, or agitation. Calm, cooperative, and communicative. Appropriate interactions and affect.. Notes Wound exam; right anterior lower leg wound. The patient has mostly healthy looking granulation. Large areas of this wound are already epithelialized. There is some eschar on the lateral part of the wound that may require debridement next week. There is no evidence of surrounding infection. Electronic Signature(s) Signed: 01/08/2019 4:56:01 PM By: Linton Ham MD Entered By: Linton Ham on 01/08/2019 16:22:41 Tina [name] ([id number]) -------------------------------------------------------------------------------- Physician Orders Details Patient Name: Tina Leek T. Date of Service: 01/08/2019 3:45 PM Medical Record Number: [id number] Patient Account Number: [id number] Date of Birth/Sex: [birth year] (84 y.o. F) Treating RN: Harold Barban Primary Care Provider: Einar Pheasant Other Clinician: Referring Provider: Einar Pheasant Treating Provider/Extender: Tito Dine in Treatment: 4 Verbal / Phone Orders: No Diagnosis Coding Wound Cleansing Wound #1 Right,Anterior Lower Leg o Cleanse wound with mild soap and water o Dial antibacterial soap, wash wounds, rinse and pat dry prior to dressing wounds o May Shower, gently pat wound dry prior to applying new dressing. Anesthetic (add to Medication List) Wound #1 Right,Anterior Lower Leg o Topical Lidocaine 4% cream applied to wound bed prior to debridement (In Clinic Only). Primary Wound Dressing Wound #1 Right,Anterior Lower Leg o Mepitel One Contact layer Secondary Dressing Wound #1 Right,Anterior Lower  Leg o ABD and Kerlix/Conform o Silver Alginate Dressing Change Frequency Wound #1 Right,Anterior Lower Leg o Change dressing every other day. Follow-up Appointments Wound #1 Right,Anterior Lower Leg o Return Appointment in 1 week. Edema Control Wound #1 Right,Anterior Lower Leg o Other: - Tubigrip F Electronic Signature(s) Signed: 01/08/2019 4:23:12 PM By: Harold Barban Signed: 01/08/2019 4:56:01 PM By: Linton Ham MD Entered By: Harold Barban on 01/08/2019 16:14:53 Tina [name] ([id number]) -------------------------------------------------------------------------------- Problem List Details Patient Name: Tina Leek T. Date of Service: 01/08/2019 3:45 PM Medical Record Number: [id number] Patient Account Number: [id number] Date of Birth/Sex: December 31, 1934 (84 y.o. F) Treating RN: Harold Barban Primary Care Provider: Einar Pheasant Other Clinician: Referring Provider: Einar Pheasant Treating Provider/Extender: Tito Dine in Treatment: 4 Active Problems ICD-10 Evaluated Encounter Code Description Active Date Today Diagnosis S80.11XD Contusion of right lower leg, subsequent encounter 12/11/2018 No Yes L97.818 Non-pressure chronic ulcer of other part of right lower leg 12/11/2018 No Yes with other specified severity I87.331 Chronic venous hypertension (idiopathic) with ulcer and 12/11/2018 No Yes inflammation of right lower extremity Inactive Problems Resolved Problems Electronic Signature(s) Signed: 01/08/2019 4:56:01 PM By: Linton Ham MD Entered By: Linton Ham on 01/08/2019 16:18:13 Tina [name] ([id number]) -------------------------------------------------------------------------------- Progress Note Details Patient Name: Tina Leek T. Date of Service: 01/08/2019 3:45 PM Medical Record Number: [id number] Patient Account Number: [id number] Date of Birth/Sex: [birth year] (84 y.o. F) Treating RN: Harold Barban Primary Care Provider:  Einar Pheasant Other Clinician: Referring Provider: Einar Pheasant Treating Provider/Extender: Tito Dine in Treatment: 4 Subjective History of Present Illness (HPI) ADMISSION 12/11/2018 This is an 84 year old woman with multiple medical problems. She was in the ER on 7/25 after falling while going up brick stairs. She had a fairly substantial skin tear on her right anterior leg. The skin was reattached with Steri-Strips. She was told that it would "fall off". They put  Curlex and Coban on this. She has had previous wounds on the left anterior leg that she was able to get the heel roughly 3 years ago. 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 8/5-Returns at 1 week right lacerated wound on the right anterior leg. Today the wound has the skin flap on it with hematoma below the skin flap line loosely on top. 8/12-Patient returns at 1 week after the skin flap and hematoma were addressed, we put her in 2 layer wrap and today the leg looks swollen and red, patient also complaining of some discomfort in that area 8/19; this is a patient with chronic venous insufficiency with stasis dermatitis and very fragile skin. She arrived with a substantial skin tear on the right anterior lower leg with subcutaneous hematoma. Eventually this was evacuated or evacuated on its own and I am not really sure. In any case the patient arrives with a reasonably healthy looking wound on the right anterior mid tibia area. She is supposed to be using a contact layer and silver alginate her daughter is apparently changing this every second day. I do not think she is putting any compression on this. She certainly needs this  however in spite of this the wound looks quite a bit better than when I saw this 3 weeks ago 8/26; the patient is making gradual progress with this wound with [place] in [place] as of epithelialization. He is using a contact layer silver alginate ABDs. She did not tolerate any form of compression. She talk to [name] about going to see vein and vascular relating to intra-abdominal issues. I had a quick look at this. 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 I do not think this has anything to do with our issues. Objective Constitutional Patient is hypertensive.. Pulse regular and within target range for patient.Marland Kitchen Respirations regular, non-labored and within target range.. Temperature is normal and within the target range for the patient.Marland Kitchen appears in no distress. Vitals Time Taken: 3:51 PM, Height: 65 in, Weight: 138 lbs, BMI: 23, Temperature: 98.5 F, Pulse: 73 bpm, Respiratory Rate: Lords, Lusia T. ([id number]) 16 breaths/min, Blood Pressure: 172/59 mmHg. Eyes Conjunctivae clear. No discharge. Respiratory Respiratory effort is easy and symmetric bilaterally. Rate is normal at rest and on room air.. Cardiovascular Pedal pulses palpable and strong bilaterally.. Edema present in both extremities. Changes of chronic venous. Lymphatic None palpable in the popliteal area bilaterally. Psychiatric No evidence of depression, anxiety, or agitation. Calm, cooperative, and communicative. Appropriate interactions and affect.. General Notes: Wound exam; right anterior lower leg wound. The patient has mostly healthy looking granulation. Large areas of this wound are already epithelialized. There is some eschar on the lateral part of the wound that may require debridement next week. There is no evidence of surrounding infection. Integumentary (Hair, Skin) Changes of chronic venous insufficiency. Wound #1 status  is Open. Original cause of wound was Trauma. The wound is located on the Right,Anterior Lower Leg. The wound measures 6.4cm length x 6cm width x 0.1cm depth; 30.159cm^2 area and 3.016cm^3 volume. There is Fat Layer (Subcutaneous Tissue) Exposed exposed. There is no tunneling or undermining noted. There is  a large amount of sanguinous drainage noted. The wound margin is flat and intact. There is medium (34-66%) red granulation within the wound bed. There is a medium (34-66%) amount of necrotic tissue within the wound bed including Adherent Slough. Assessment Active Problems ICD-10 Contusion of right lower leg, subsequent encounter Non-pressure chronic ulcer of other part of right lower leg with other specified severity Chronic venous hypertension (idiopathic) with ulcer and inflammation of right lower extremity Plan Wound Cleansing: Wound #1 Right,Anterior Lower Leg: Cleanse wound with mild soap and water Dial antibacterial soap, wash wounds, rinse and pat dry prior to dressing wounds May Shower, gently pat wound dry prior to applying new dressing. Anesthetic (add to Medication List): Wound #1 Right,Anterior Lower Leg: AVRIE, KEDZIERSKI. ([id number]) Topical Lidocaine 4% cream applied to wound bed prior to debridement (In Clinic Only). Primary Wound Dressing: Wound #1 Right,Anterior Lower Leg: Mepitel One Contact layer Secondary Dressing: Wound #1 Right,Anterior Lower Leg: ABD and Kerlix/Conform Silver Alginate Dressing Change Frequency: Wound #1 Right,Anterior Lower Leg: Change dressing every other day. Follow-up Appointments: Wound #1 Right,Anterior Lower Leg: Return Appointment in 1 week. Edema Control: Wound #1 Right,Anterior Lower Leg: Other: - Tubigrip F 1. I did not change the primary dressing which seems to be working for her. 2. She did not tolerate any real compression but fortunately the wound appears to be epithelializing albeit in a regular fashion 3. She is going for  duplex ultrasounds to look at her mesenteric artery stents next week. That does not have anything to do with our wounds. There is no arterial insufficiency that is obvious in her lower extremities although her ABI on the left was only 0.72. Her peripheral pulses are easily palpable Electronic Signature(s) Signed: 01/08/2019 4:56:01 PM By: Linton Ham MD Entered By: Linton Ham on 01/08/2019 16:23:59 Tina [name] ([id number]) -------------------------------------------------------------------------------- SuperBill Details Patient Name: Tina Leek T. Date of Service: 01/08/2019 Medical Record Number: [id number] Patient Account Number: [id number] Date of Birth/Sex: 1935-02-07 (84 y.o. F) Treating RN: Harold Barban Primary Care Provider: Einar Pheasant Other Clinician: Referring Provider: Einar Pheasant Treating Provider/Extender: Tito Dine in Treatment: 4 Diagnosis Coding ICD-10 Codes Code Description S80.11XD Contusion of right lower leg, subsequent encounter L97.818 Non-pressure chronic ulcer of other part of right lower leg with other specified severity I87.331 Chronic venous hypertension (idiopathic) with ulcer and inflammation of right lower extremity Facility Procedures CPT4 Code: [id number] Description: [phone] - WOUND CARE VISIT-LEV 2 EST PT Modifier: Quantity: 1 Physician Procedures CPT4: Description Modifier Quantity Code [id number] - WC PHYS LEVEL 3 - EST PT 1 ICD-10 Diagnosis Description L97.818 Non-pressure chronic ulcer of other part of right lower leg with other specified severity S80.11XD Contusion of right lower leg,  subsequent encounter I87.331 Chronic venous hypertension (idiopathic) with ulcer and inflammation of right lower extremity Electronic Signature(s) Signed: 01/08/2019 4:56:01 PM By: Linton Ham MD Entered By: Linton Ham on 01/08/2019 16:24:19

## 2019-01-08 NOTE — Telephone Encounter (Signed)
Spoke with the patient and she is now scheduled with Dr. Lucky Cowboy for angio on 01/23/2019 with a 9:30 am arrival time to he [facility]. Patient will do her Covid testing on 01/21/2019 before 11:00 am at the [facility]. Pre-procedure instructions were discussed and this information will be mailed to the patient.

## 2019-01-08 NOTE — Progress Notes (Signed)
TARRA, PENCE ([id number]) Visit Report for 01/08/2019 Arrival Information Details Patient Name: Tina [name], Tina [name]. Date of Service: 01/08/2019 3:45 PM Medical Record Number: [id number] Patient Account Number: [id number] Date of Birth/Sex: [birth year] (84 y.o. F) Treating RN: Army Melia Primary Care Breuna Loveall: Einar Pheasant Other Clinician: Referring Quincey Quesinberry: Einar Pheasant Treating Greenly Rarick/Extender: Tito Dine in Treatment: 4 Visit Information History Since Last Visit Added or deleted any medications: No Patient Arrived: Ambulatory Any new allergies or adverse reactions: No Arrival Time: 15:51 Had a fall or experienced change in No Accompanied By: self activities of daily living that may affect Transfer Assistance: None risk of falls: Signs or symptoms of abuse/neglect since last visito No Hospitalized since last visit: No Has Dressing in Place as Prescribed: Yes Pain Present Now: No Electronic Signature(s) Signed: 01/08/2019 4:18:00 PM By: Army Melia Entered By: Army Melia on 01/08/2019 15:51:27 [name] ([id number]) -------------------------------------------------------------------------------- Clinic Level of Care Assessment Details Patient Name: Tina Leek T. Date of Service: 01/08/2019 3:45 PM Medical Record Number: [id number] Patient Account Number: [id number] Date of Birth/Sex: 1935/04/29 (84 y.o. F) Treating RN: Harold Barban Primary Care Carmisha Larusso: Einar Pheasant Other Clinician: Referring Eller Sweis: Einar Pheasant Treating Kellis Topete/Extender: Tito Dine in Treatment: 4 Clinic Level of Care Assessment Items TOOL 4 Quantity Score []  - Use when only an EandM is performed on FOLLOW-UP visit 0 ASSESSMENTS - Nursing Assessment / Reassessment X - Reassessment of Co-morbidities (includes updates in patient status) 1 10 X- 1 5 Reassessment of Adherence to Treatment Plan ASSESSMENTS - Wound and Skin Assessment / Reassessment X -  Simple Wound Assessment / Reassessment - one wound 1 5 []  - 0 Complex Wound Assessment / Reassessment - multiple wounds []  - 0 Dermatologic / Skin Assessment (not related to wound area) ASSESSMENTS - Focused Assessment []  - Circumferential Edema Measurements - multi extremities 0 []  - 0 Nutritional Assessment / Counseling / Intervention []  - 0 Lower Extremity Assessment (monofilament, tuning fork, pulses) []  - 0 Peripheral Arterial Disease Assessment (using hand held doppler) ASSESSMENTS - Ostomy and/or Continence Assessment and Care []  - Incontinence Assessment and Management 0 []  - 0 Ostomy Care Assessment and Management (repouching, etc.) PROCESS - Coordination of Care X - Simple Patient / Family Education for ongoing care 1 15 []  - 0 Complex (extensive) Patient / Family Education for ongoing care []  - 0 Staff obtains Programmer, systems, Records, Test Results / Process Orders []  - 0 Staff telephones HHA, Nursing Homes / Clarify orders / etc []  - 0 Routine Transfer to another Facility (non-emergent condition) []  - 0 Routine Hospital Admission (non-emergent condition) []  - 0 New Admissions / Biomedical engineer / Ordering NPWT, Apligraf, etc. []  - 0 Emergency Hospital Admission (emergent condition) X- 1 10 Simple Discharge Coordination Tina [name], MUENCH. ([id number]) []  - 0 Complex (extensive) Discharge Coordination PROCESS - Special Needs []  - Pediatric / Minor Patient Management 0 []  - 0 Isolation Patient Management []  - 0 Hearing / Language / Visual special needs []  - 0 Assessment of Community assistance (transportation, D/C planning, etc.) []  - 0 Additional assistance / Altered mentation []  - 0 Support Surface(s) Assessment (bed, cushion, seat, etc.) INTERVENTIONS - Wound Cleansing / Measurement X - Simple Wound Cleansing - one wound 1 5 []  - 0 Complex Wound Cleansing - multiple wounds X- 1 5 Wound Imaging (photographs - any number of wounds) []  - 0 Wound Tracing  (instead of photographs) X- 1 5 Simple Wound Measurement - one wound []  - 0 Complex Wound  Measurement - multiple wounds INTERVENTIONS - Wound Dressings X - Small Wound Dressing one or multiple wounds 1 10 []  - 0 Medium Wound Dressing one or multiple wounds []  - 0 Large Wound Dressing one or multiple wounds []  - 0 Application of Medications - topical []  - 0 Application of Medications - injection INTERVENTIONS - Miscellaneous []  - External ear exam 0 []  - 0 Specimen Collection (cultures, biopsies, blood, body fluids, etc.) []  - 0 Specimen(s) / Culture(s) sent or taken to Lab for analysis []  - 0 Patient Transfer (multiple staff / Civil Service fast streamer / Similar devices) []  - 0 Simple Staple / Suture removal (25 or less) []  - 0 Complex Staple / Suture removal (26 or more) []  - 0 Hypo / Hyperglycemic Management (close monitor of Blood Glucose) []  - 0 Ankle / Brachial Index (ABI) - do not check if billed separately X- 1 5 Vital Signs Tina [name], Tina T. ([id number]) Has the patient been seen at the hospital within the last three years: Yes Total Score: 75 Level Of Care: New/Established - Level 2 Electronic Signature(s) Signed: 01/08/2019 4:23:12 PM By: Harold Barban Entered By: Harold Barban on 01/08/2019 16:13:44 [name] ([id number]) -------------------------------------------------------------------------------- Lower Extremity Assessment Details Patient Name: Tina Leek T. Date of Service: 01/08/2019 3:45 PM Medical Record Number: [id number] Patient Account Number: [id number] Date of Birth/Sex: 09/27/1934 (84 y.o. F) Treating RN: Army Melia Primary Care Mansour Balboa: Einar Pheasant Other Clinician: Referring Asser Lucena: Einar Pheasant Treating Yovanna Cogan/Extender: Ricard Dillon Weeks in Treatment: 4 Edema Assessment Assessed: [Left: No] [Right: No] Edema: [Left: Ye] [Right: s] Calf Left: Right: Point of Measurement: 32 cm From Medial Instep cm 32 cm Ankle Left:  Right: Point of Measurement: 10 cm From Medial Instep cm 25 cm Vascular Assessment Pulses: Dorsalis Pedis Palpable: [Right:Yes] Electronic Signature(s) Signed: 01/08/2019 4:18:00 PM By: Army Melia Entered By: Army Melia on 01/08/2019 15:57:58 [name] ([id number]) -------------------------------------------------------------------------------- Multi Wound Chart Details Patient Name: Tina Leek T. Date of Service: 01/08/2019 3:45 PM Medical Record Number: [id number] Patient Account Number: [id number] Date of Birth/Sex: 16-Feb-1935 (84 y.o. F) Treating RN: Harold Barban Primary Care Kimm Ungaro: Einar Pheasant Other Clinician: Referring Genine Beckett: Einar Pheasant Treating Leann Mayweather/Extender: Tito Dine in Treatment: 4 Vital Signs Height(in): 65 Pulse(bpm): 73 Weight(lbs): 138 Blood Pressure(mmHg): 172/59 Body Mass Index(BMI): 23 Temperature(F): 98.5 Respiratory Rate 16 (breaths/min): Photos: [N/A:N/A] Wound Location: Right Lower Leg - Anterior N/A N/A Wounding Event: Trauma N/A N/A Primary Etiology: Skin Tear N/A N/A Comorbid History: Anemia, Chronic Obstructive N/A N/A Pulmonary Disease (COPD), Congestive Heart Failure, Coronary Artery Disease, Hypertension, Received Chemotherapy, Received Radiation Date Acquired: 12/06/2018 N/A N/A Weeks of Treatment: 4 N/A N/A Wound Status: Open N/A N/A Measurements L x W x D 6.4x6x0.1 N/A N/A (cm) Area (cm) : 30.159 N/A N/A Volume (cm) : 3.016 N/A N/A % Reduction in Area: 30.50% N/A N/A % Reduction in Volume: 30.50% N/A N/A Classification: Full Thickness Without N/A N/A Exposed Support Structures Exudate Amount: Large N/A N/A Exudate Type: Sanguinous N/A N/A Exudate Color: red N/A N/A Wound Margin: Flat and Intact N/A N/A Granulation Amount: Medium (34-66%) N/A N/A Granulation Quality: Red N/A N/A Necrotic Amount: Medium (34-66%) N/A N/A Exposed Structures: N/A N/A Tina [name], Tina [name] ([id number]) Fat  Layer (Subcutaneous Tissue) Exposed: Yes Fascia: No Tendon: No Muscle: No Joint: No Bone: No Epithelialization: Small (1-33%) N/A N/A Treatment Notes Electronic Signature(s) Signed: 01/08/2019 4:56:01 PM By: Linton Ham MD Entered By: Linton Ham on 01/08/2019 16:18:22 [name] ([id number]) -------------------------------------------------------------------------------- Multi-Disciplinary  Care Plan Details Patient Name: Tina [name], GRAGERT. Date of Service: 01/08/2019 3:45 PM Medical Record Number: [id number] Patient Account Number: [id number] Date of Birth/Sex: 04-Nov-1934 (84 y.o. F) Treating RN: Harold Barban Primary Care Amandeep Hogston: Einar Pheasant Other Clinician: Referring Caran Storck: Einar Pheasant Treating Rumor Sun/Extender: Tito Dine in Treatment: 4 Active Inactive Abuse / Safety / Falls / Self Care Management Nursing Diagnoses: History of Falls Goals: Patient will not experience any injury related to falls Date Initiated: 12/18/2018 Target Resolution Date: 01/01/2019 Goal Status: Active Patient will remain injury free related to falls Date Initiated: 12/18/2018 Target Resolution Date: 01/01/2019 Goal Status: Active Interventions: Assess Activities of Daily Living upon admission and as needed Notes: Orientation to the Wound Care Program Nursing Diagnoses: Knowledge deficit related to the wound healing center program Goals: Patient/caregiver will verbalize understanding of the [facility] Date Initiated: 12/18/2018 Target Resolution Date: 12/18/2018 Goal Status: Active Interventions: Provide education on orientation to the wound center Notes: Wound/Skin Impairment Nursing Diagnoses: Impaired tissue integrity Goals: Ulcer/skin breakdown will have a volume reduction of 30% by week 4 Date Initiated: 12/11/2018 Target Resolution Date: 01/11/2019 Goal Status: Active Tina [name], LOPATA ([id number]) Interventions: Assess  patient/caregiver ability to obtain necessary supplies Assess patient/caregiver ability to perform ulcer/skin care regimen upon admission and as needed Assess ulceration(s) every visit Notes: Electronic Signature(s) Signed: 01/08/2019 4:23:12 PM By: Harold Barban Entered By: Harold Barban on 01/08/2019 16:11:13 [name] ([id number]) -------------------------------------------------------------------------------- Pain Assessment Details Patient Name: Tina Leek T. Date of Service: 01/08/2019 3:45 PM Medical Record Number: [id number] Patient Account Number: [id number] Date of Birth/Sex: [birth year] (84 y.o. F) Treating RN: Army Melia Primary Care Kamy Poinsett: Einar Pheasant Other Clinician: Referring Severino Paolo: Einar Pheasant Treating Carlin Mamone/Extender: Tito Dine in Treatment: 4 Active Problems Location of Pain Severity and Description of Pain Patient Has Paino No Site Locations Pain Management and Medication Current Pain Management: Electronic Signature(s) Signed: 01/08/2019 4:18:00 PM By: Army Melia Entered By: Army Melia on 01/08/2019 15:51:33 [name] ([id number]) -------------------------------------------------------------------------------- Patient/Caregiver Education Details Patient Name: [name]. Date of Service: 01/08/2019 3:45 PM Medical Record Number: [id number] Patient Account Number: [id number] Date of Birth/Gender: [birth year] (84 y.o. F) Treating RN: Harold Barban Primary Care Physician: Einar Pheasant Other Clinician: Referring Physician: Einar Pheasant Treating Physician/Extender: Tito Dine in Treatment: 4 Education Assessment Education Provided To: Patient Education Topics Provided Wound/Skin Impairment: Handouts: Caring for Your Ulcer Methods: Demonstration, Explain/Verbal Responses: State content correctly Electronic Signature(s) Signed: 01/08/2019 4:23:12 PM By: Harold Barban Entered By: Harold Barban on 01/08/2019 16:11:44 [name] ([id number]) -------------------------------------------------------------------------------- Wound Assessment Details Patient Name: Tina Leek T. Date of Service: 01/08/2019 3:45 PM Medical Record Number: [id number] Patient Account Number: [id number] Date of Birth/Sex: 10/28/1934 (84 y.o. F) Treating RN: Army Melia Primary Care Kloe Oates: Einar Pheasant Other Clinician: Referring Shailee Foots: Einar Pheasant Treating Inigo Lantigua/Extender: Tito Dine in Treatment: 4 Wound Status Wound Number: 1 Primary Skin Tear Etiology: Wound Location: Right Lower Leg - Anterior Wound Open Wounding Event: Trauma Status: Date Acquired: 12/06/2018 Comorbid Anemia, Chronic Obstructive Pulmonary Disease Weeks Of Treatment: 4 History: (COPD), Congestive Heart Failure, Coronary Clustered Wound: No Artery Disease, Hypertension, Received Chemotherapy, Received Radiation Photos Wound Measurements Length: (cm) 6.4 Width: (cm) 6 Depth: (cm) 0.1 Area: (cm) 30.159 Volume: (cm) 3.016 % Reduction in Area: 30.5% % Reduction in Volume: 30.5% Epithelialization: Small (1-33%) Tunneling: No Undermining: No Wound Description Full Thickness Without Exposed Support Classification: Structures Wound Margin: Flat and Intact Exudate Large Amount:  Exudate Type: Sanguinous Exudate Color: red Foul Odor After Cleansing: No Slough/Fibrino No Wound Bed Granulation Amount: Medium (34-66%) Exposed Structure Granulation Quality: Red Fascia Exposed: No Necrotic Amount: Medium (34-66%) Fat Layer (Subcutaneous Tissue) Exposed: Yes Necrotic Quality: Adherent Slough Tendon Exposed: No Muscle Exposed: No Joint Exposed: No Bone Exposed: No Tina [name], Tina T. ([id number]) Treatment Notes Wound #1 (Right, Anterior Lower Leg) Notes mepitel, silver alginate, ABD, coban, tubigrip Electronic Signature(s) Signed: 01/08/2019 4:18:00 PM By: Army Melia Entered  By: Army Melia on 01/08/2019 15:56:04 [name] ([id number]) -------------------------------------------------------------------------------- Vitals Details Patient Name: Tina Leek T. Date of Service: 01/08/2019 3:45 PM Medical Record Number: [id number] Patient Account Number: [id number] Date of Birth/Sex: [birth year] (84 y.o. F) Treating RN: Army Melia Primary Care Honest Safranek: Einar Pheasant Other Clinician: Referring Puja Caffey: Einar Pheasant Treating Jovonne Wilton/Extender: Tito Dine in Treatment: 4 Vital Signs Time Taken: 15:51 Temperature (F): 98.5 Height (in): 65 Pulse (bpm): 73 Weight (lbs): 138 Respiratory Rate (breaths/min): 16 Body Mass Index (BMI): 23 Blood Pressure (mmHg): 172/59 Reference Range: 80 - 120 mg / dl Electronic Signature(s) Signed: 01/08/2019 4:18:00 PM By: Army Melia Entered By: Army Melia on 01/08/2019 15:52:11

## 2019-01-11 NOTE — Progress Notes (Signed)
SUBJECTIVE:  [name], female    DOB: 22-Jun-1934, 84 y.o.   MRN: [id number] Chief Complaint  Patient presents with  . Follow-up    HPI  Tina [name] is a 84 y.o. female that presents today on referral by Dr. Saralyn Pilar with concerns for peripheral artery disease.  Patient states that she has extreme weakness of her lower extremities.  However she endorses that is not just lower extremities but just extreme weakness overall.  She states that when she tries to get up and do anything she can only go for a few short moments before she needs to sit down and relax.  The patient previously had a mesenteric angiogram done on 02/12/2017 with placement of a stent to superior mesenteric artery.  Previous duplex showed a greater than 50% stenosis in the mid to proximal abdominal aorta.  Patient also had evidence of an occluded celiac artery, which was consistent with the angiogram.  Today the patient denies any nausea, vomiting or bowel changes.  She denies any abdominal pain or food phobia.  The patient states that the last time she felt like this she found that she needed to have a stent placed within her mesenteric artery.  The patient has not had a duplex of her mesenteric arteries since 03/21/2017.  Today non invasive studies showed an ABI of 0.86 on the right and 0.71 on the left.  The patient has triphasic waveforms in the bilateral tibial arteries.  Abdominal studies show 70-99% stenosis in the SMA.  History of an occluded celiac artery.  There are elevated velocities within the proximal to mid aorta that go from 54 to 680.    Past Medical History:  Diagnosis Date  . Anemia   . Asthma   . Bladder cancer ([place])   . BRCA positive   . CAD (coronary artery disease)   . CHF (congestive heart failure) ([place])   . COPD (chronic obstructive pulmonary disease) ([place])   . Emphysema lung ([place])   . GERD (gastroesophageal reflux disease)   . History of colon polyps   . Hypercholesterolemia   .  Hyperglycemia   . Hypertension   . Lung nodules   . Osteoporosis   . Personal history of tobacco use, presenting hazards to health 11/25/2014  . Polycythemia vera(238.4)   . Renal cyst   . Skin cancer     Past Surgical History:  Procedure Laterality Date  . ANGIOPLASTY     coronary (x1)  . COLONOSCOPY WITH PROPOFOL N/A 03/02/2015   Procedure: COLONOSCOPY WITH PROPOFOL;  Surgeon: Lollie Sails, MD;  Location: [facility] ENDOSCOPY;  Service: Endoscopy;  Laterality: N/A;  . COLONOSCOPY WITH PROPOFOL N/A 02/13/2017   Procedure: COLONOSCOPY WITH PROPOFOL;  Surgeon: [name], MD;  Location: [facility] ENDOSCOPY;  Service: Endoscopy;  Laterality: N/A;  . CORONARY ANGIOPLASTY    . CORONARY ARTERY BYPASS GRAFT  09/17/2017   pt denies  . CYSTOSCOPY W/ RETROGRADES Bilateral 02/13/2017   Procedure: CYSTOSCOPY WITH RETROGRADE PYELOGRAM;  Surgeon: Hollice Espy, MD;  Location: [facility] ORS;  Service: Urology;  Laterality: Bilateral;  . CYSTOSCOPY W/ RETROGRADES Bilateral 09/19/2017   Procedure: CYSTOSCOPY WITH RETROGRADE PYELOGRAM;  Surgeon: Hollice Espy, MD;  Location: [facility] ORS;  Service: Urology;  Laterality: Bilateral;  . CYSTOSCOPY WITH BIOPSY  03/26/2017   Procedure: CYSTOSCOPY WITH BIOPSY;  Surgeon: Hollice Espy, MD;  Location: [facility] ORS;  Service: Urology;;  . Consuela Mimes WITH STENT PLACEMENT Bilateral 02/13/2017   Procedure: CYSTOSCOPY WITH STENT PLACEMENT;  Surgeon: Hollice Espy, MD;  Location: [facility] ORS;  Service: Urology;  Laterality: Bilateral;  . CYSTOSCOPY WITH STENT PLACEMENT Bilateral 03/26/2017   Procedure: CYSTOSCOPY WITH STENT EXCHANGE;  Surgeon: Hollice Espy, MD;  Location: [facility] ORS;  Service: Urology;  Laterality: Bilateral;  . ESOPHAGOGASTRODUODENOSCOPY (EGD) WITH PROPOFOL N/A 02/13/2017   Procedure: ESOPHAGOGASTRODUODENOSCOPY (EGD) WITH PROPOFOL;  Surgeon: [name], MD;  Location: [facility] ENDOSCOPY;  Service: Endoscopy;  Laterality: N/A;  . PORTA CATH INSERTION N/A 02/28/2017    Procedure: PORTA CATH INSERTION;  Surgeon: Algernon Huxley, MD;  Location: [place] CV LAB;  Service: Cardiovascular;  Laterality: N/A;  . PORTA CATH REMOVAL N/A 12/24/2017   Procedure: PORTA CATH REMOVAL;  Surgeon: Algernon Huxley, MD;  Location: [place] CV LAB;  Service: Cardiovascular;  Laterality: N/A;  . SALPINGOOPHORECTOMY    . TONSILECTOMY/ADENOIDECTOMY WITH MYRINGOTOMY    . TONSILLECTOMY    . TRANSURETHRAL RESECTION OF BLADDER TUMOR N/A 02/13/2017   Procedure: TRANSURETHRAL RESECTION OF BLADDER TUMOR (TURBT);  Surgeon: Hollice Espy, MD;  Location: [facility] ORS;  Service: Urology;  Laterality: N/A;  . TUBAL LIGATION    . UPPER GI ENDOSCOPY  02/13/2017  . URETEROSCOPY Left 09/19/2017   Procedure: URETEROSCOPY;  Surgeon: Hollice Espy, MD;  Location: [facility] ORS;  Service: Urology;  Laterality: Left;  Marland Kitchen VISCERAL ARTERY INTERVENTION N/A 02/12/2017   Procedure: VISCERAL ARTERY INTERVENTION;  Surgeon: Algernon Huxley, MD;  Location: [place] [facility] LAB;  Service: Cardiovascular;  Laterality: N/A;    Social History   Socioeconomic History  . Marital status: Widowed    Spouse name: Not on file  . Number of children: 2  . Years of education: Not on file  . Highest education level: Not on file  Occupational History  . Not on file  Social Needs  . Financial resource strain: Not hard at all  . Food insecurity    Worry: Never true    Inability: Never true  . Transportation needs    Medical: No    Non-medical: No  Tobacco Use  . Smoking status: Former Smoker    Packs/day: 1.00    Years: 45.00    Pack years: 45.00    Types: Cigarettes    Quit date: 11/11/2003    Years since quitting: 15.1  . Smokeless tobacco: Never Used  Substance and Sexual Activity  . Alcohol use: No    Alcohol/week: 0.0 standard drinks    Comment: occasional  . Drug use: No  . Sexual activity: Never  Lifestyle  . Physical activity    Days per week: Not on file    Minutes per session: Not on file  .  Stress: Not at all  Relationships  . Social Herbalist on phone: Not on file    Gets together: Not on file    Attends religious service: Not on file    Active member of club or organization: Not on file    Attends meetings of clubs or organizations: Not on file    Relationship status: Not on file  . Intimate partner violence    Fear of current or ex partner: No    Emotionally abused: No    Physically abused: No    Forced sexual activity: No  Other Topics Concern  . Not on file  Social History Narrative  . Not on file    Family History  Problem Relation Age of Onset  . Cirrhosis Father        died  age 62  . Alcohol abuse Father   . Asthma Mother   . Congestive Heart Failure Mother   . Breast cancer Mother        2 (1/2 sisters)  . Osteoarthritis Mother   . Colon cancer Mother   . Lupus Sister   . Alcohol abuse Sister   . Ovarian cancer Sister   . Osteoporosis Sister   . Skin cancer Sister   . Breast cancer Cousin   . Breast cancer Maternal Aunt     Allergies  Allergen Reactions  . Evista [Raloxifene] Other (See Comments)    Night sweats   . Fluticasone-Salmeterol Other (See Comments)    Cough, "chokes me"      Review of Systems   Review of Systems: Negative Unless Checked Constitutional: _0 Weight loss  _1 Fever  _2 Chills Cardiac: _3 Chest pain   _4  Atrial Fibrillation  _5 Palpitations   _6 Shortness of breath when laying flat   _7 Shortness of breath with exertion. _8 Shortness of breath at rest Vascular:  _9 Pain in legs with walking   _10 Pain in legs with standing _11 Pain in legs when laying flat   _12 Claudication    _13 Pain in feet when laying flat    _14 History of DVT   _15 Phlebitis   _16 Swelling in legs   _17 Varicose veins   _18 Non-healing ulcers Pulmonary:   _19 Uses home oxygen   _20 Productive cough   _21 Hemoptysis   _22 Wheeze  _23 COPD   _24 Asthma Neurologic:  _25 Dizziness   _26 Seizures  _27 Blackouts _28 History of stroke   _29 History of TIA  _30 Aphasia   _31 Temporary  Blindness   _32 Weakness or numbness in arm   _33 Weakness or numbness in leg Musculoskeletal:   _34 Joint swelling   _35 Joint pain   _36 Low back pain  _37  History of Knee Replacement _38 Arthritis _39 back Surgeries  _40  Spinal Stenosis    Hematologic:  _41 Easy bruising  _42 Easy bleeding   _43 Hypercoagulable state   _44 Anemic Gastrointestinal:  _45 Diarrhea   _46 Vomiting  _47 Gastroesophageal reflux/heartburn   _48 Difficulty swallowing. _49 Abdominal pain Genitourinary:  _50 Chronic kidney disease   _51 Difficult urination  _52 Anuric   _53 Blood in urine _54 Frequent urination  _55 Burning with urination   _56 Hematuria Skin:  _57 Rashes   _58 Ulcers _59 Wounds Psychological:  _60 History of anxiety   _61  History of major depression  _62  Memory Difficulties      OBJECTIVE:   Physical Exam  BP (!) 195/73 (BP Location: Left Arm, Patient Position: Sitting, Cuff Size: Normal)   Pulse 71   Resp 16   Ht _63  (1.651 m)   Wt 138 lb (62.6 kg)   BMI 22.96 kg/m   Gen: WD/WN, NAD Head: [place]/AT, No temporalis wasting.  Ear/Nose/Throat: Hearing grossly intact, nares w/o erythema or drainage Eyes: PER, EOMI, sclera nonicteric.  Neck: Supple, no masses.  No JVD.  Pulmonary:  Good air movement, no use of accessory muscles.  Cardiac: RRR Vascular:  Vessel Right Left  Radial Palpable Palpable  Dorsalis Pedis Palpable Palpable  Posterior Tibial Palpable Palpable   Gastrointestinal: soft, non-distended. No guarding/no peritoneal signs.  Musculoskeletal: M/S 5/5 throughout.  No deformity or atrophy.  Neurologic: Pain and light touch intact in extremities.  Symmetrical.  Speech is fluent. Motor exam as listed above. Psychiatric: Judgment intact, Mood & affect appropriate for pt's clinical situation. Dermatologic: No Venous rashes. No Ulcers Noted.  No changes consistent with cellulitis. Lymph : No Cervical lymphadenopathy, no lichenification or skin changes of chronic lymphedema.       ASSESSMENT AND PLAN:  1. 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  This represents a high risk for bowel infarction and death.  Patient should undergo angiography of the mesenteric arteries with the hope for intervention to eliminate the ischemic symptoms.    The risks and benefits as well as the alternative therapies was discussed in detail with the patient.  All questions were answered.  Patient agrees to proceed with angiography and intervention.  The patient will follow up with me after the angiogram.  2. Essential hypertension Continue antihypertensive medications as already ordered, these medications have been reviewed and there are no changes at this time.   3. Simple chronic bronchitis (HCC) Continue pulmonary medications and aerosols as already ordered, these medications have been reviewed and there are no changes at this time.    4. Aortic atherosclerosis ([place])  Had a discussion with the patient today that in the midst of her mesenteric angiogram we will evaluate the status of her aortic stenosis.  Depending upon the extent of the aortic stenosis this could actually account for the diminished ABIs as well as the weakness in her lower extremities.  We discussed the possibility that as far as the aorta goes this may be more of a diagnostic study if possible continue to do further intervention.  Patient understands and agrees.  Current Outpatient Medications on File Prior to Visit  Medication Sig Dispense Refill  . albuterol (PROVENTIL HFA;VENTOLIN HFA) 108 (90 Base) MCG/ACT inhaler Inhale 2 puffs into the lungs every 6 (six) hours as needed for wheezing or shortness of breath. 1 Inhaler 2  . amLODipine (NORVASC) 5 MG tablet TAKE 1 TABLET BY MOUTH TWO  TIMES DAILY 180 tablet 1  . cholecalciferol (VITAMIN D) 1000 units tablet Take 1,000 Units by mouth daily.     . clopidogrel (PLAVIX) 75 MG tablet Take 1 tablet (75 mg total) by mouth daily. 30 tablet 0  . fluticasone (FLONASE) 50 MCG/ACT nasal spray USE 2 SPRAYS INTO BOTH  NOSTRILS DAILY AS NEEDED  FOR ALLERGIES. 48 g 1  . hydrALAZINE (APRESOLINE) 25 MG tablet Take 1 tablet (25 mg total) by mouth 3 (three) times daily. 90 tablet 2  . ipratropium-albuterol (DUONEB) 0.5-2.5 (3) MG/3ML SOLN Take 3 mLs by nebulization every 6 (six) hours as needed (for shortness of breath). 36 mL 0  . losartan (COZAAR) 100 MG tablet TAKE 1 TABLET BY MOUTH ONCE A DAY 90 tablet 1  . lovastatin (MEVACOR) 40 MG tablet TAKE 1 TABLET BY MOUTH  DAILY IN THE EVENING 90 tablet 1  . metoprolol tartrate (LOPRESSOR) 25 MG tablet TAKE 1 TABLET BY MOUTH  TWICE A DAY 180 tablet 1  . OVER THE COUNTER MEDICATION daily.    . potassium chloride (K-DUR) 10 MEQ tablet TAKE 1 TABLET BY MOUTH  DAILY 90 tablet 1  . SYMBICORT 160-4.5 MCG/ACT inhaler USE 2 PUFFS TWO TIMES DAILY 30.6 g 3  . torsemide (DEMADEX) 20 MG tablet Take 20 mg by mouth every morning.  11   Current Facility-Administered Medications on File Prior to Visit  Medication Dose Route Frequency Provider Last Rate Last Dose  . heparin lock flush 100 unit/mL  500 Units Intravenous Once Sindy Guadeloupe, MD      . sodium chloride flush (NS) 0.9 % injection 10 mL  10 mL Intravenous PRN Sindy Guadeloupe, MD  There are no Patient Instructions on file for this visit. No follow-ups on file.   Kris Hartmann, NP  This note was completed with Sales executive.  Any errors are purely unintentional.

## 2019-01-15 ENCOUNTER — Encounter: Payer: Medicare Other | Attending: Internal Medicine | Admitting: Internal Medicine

## 2019-01-15 ENCOUNTER — Other Ambulatory Visit: Payer: Self-pay

## 2019-01-15 DIAGNOSIS — Z923 Personal history of irradiation: Secondary | ICD-10-CM | POA: Insufficient documentation

## 2019-01-15 DIAGNOSIS — S8011XD Contusion of right lower leg, subsequent encounter: Secondary | ICD-10-CM | POA: Insufficient documentation

## 2019-01-15 DIAGNOSIS — Z8551 Personal history of malignant neoplasm of bladder: Secondary | ICD-10-CM | POA: Insufficient documentation

## 2019-01-15 DIAGNOSIS — X58XXXA Exposure to other specified factors, initial encounter: Secondary | ICD-10-CM | POA: Diagnosis not present

## 2019-01-15 DIAGNOSIS — Z9221 Personal history of antineoplastic chemotherapy: Secondary | ICD-10-CM | POA: Diagnosis not present

## 2019-01-15 DIAGNOSIS — Z85828 Personal history of other malignant neoplasm of skin: Secondary | ICD-10-CM | POA: Insufficient documentation

## 2019-01-15 DIAGNOSIS — J449 Chronic obstructive pulmonary disease, unspecified: Secondary | ICD-10-CM | POA: Insufficient documentation

## 2019-01-15 DIAGNOSIS — I509 Heart failure, unspecified: Secondary | ICD-10-CM | POA: Insufficient documentation

## 2019-01-15 DIAGNOSIS — S81811A Laceration without foreign body, right lower leg, initial encounter: Secondary | ICD-10-CM | POA: Diagnosis not present

## 2019-01-15 DIAGNOSIS — I110 Hypertensive heart disease with heart failure: Secondary | ICD-10-CM | POA: Diagnosis not present

## 2019-01-15 DIAGNOSIS — I872 Venous insufficiency (chronic) (peripheral): Secondary | ICD-10-CM | POA: Diagnosis not present

## 2019-01-15 DIAGNOSIS — I87311 Chronic venous hypertension (idiopathic) with ulcer of right lower extremity: Secondary | ICD-10-CM | POA: Insufficient documentation

## 2019-01-15 DIAGNOSIS — L97819 Non-pressure chronic ulcer of other part of right lower leg with unspecified severity: Secondary | ICD-10-CM | POA: Insufficient documentation

## 2019-01-15 DIAGNOSIS — I2510 Atherosclerotic heart disease of native coronary artery without angina pectoris: Secondary | ICD-10-CM | POA: Insufficient documentation

## 2019-01-15 DIAGNOSIS — E785 Hyperlipidemia, unspecified: Secondary | ICD-10-CM | POA: Insufficient documentation

## 2019-01-15 DIAGNOSIS — Z95820 Peripheral vascular angioplasty status with implants and grafts: Secondary | ICD-10-CM | POA: Diagnosis not present

## 2019-01-15 DIAGNOSIS — S81801A Unspecified open wound, right lower leg, initial encounter: Secondary | ICD-10-CM | POA: Diagnosis not present

## 2019-01-17 NOTE — Progress Notes (Signed)
Tina [name], Tina [name] ([id number]) Visit Report for 01/15/2019 Arrival Information Details Patient Name: Tina [name], Tina [name]. Date of Service: 01/15/2019 8:00 AM Medical Record Number: [id number] Patient Account Number: [id number] Date of Birth/Sex: 1934-08-09 (84 y.o. F) Treating RN: Army Melia Primary Care Philisha Weinel: Einar Pheasant Other Clinician: Referring Pippa Hanif: Einar Pheasant Treating Deondrick Searls/Extender: Tito Dine in Treatment: 5 Visit Information History Since Last Visit Added or deleted any medications: No Patient Arrived: Ambulatory Any new allergies or adverse reactions: No Arrival Time: 08:07 Had a fall or experienced change in No Accompanied By: self activities of daily living that may affect Transfer Assistance: None risk of falls: Signs or symptoms of abuse/neglect since last visito No Hospitalized since last visit: No Has Dressing in Place as Prescribed: Yes Pain Present Now: No Electronic Signature(s) Signed: 01/15/2019 10:38:13 AM By: Army Melia Entered By: Army Melia on 01/15/2019 08:07:33 Tina [name] ([id number]) -------------------------------------------------------------------------------- Clinic Level of Care Assessment Details Patient Name: Tina [name]. Date of Service: 01/15/2019 8:00 AM Medical Record Number: [id number] Patient Account Number: [id number] Date of Birth/Sex: Nov 02, 1934 (84 y.o. F) Treating RN: Cornell Barman Primary Care Tiaria Biby: Einar Pheasant Other Clinician: Referring Kieth Hartis: Einar Pheasant Treating Ajla Mcgeachy/Extender: Tito Dine in Treatment: 5 Clinic Level of Care Assessment Items TOOL 4 Quantity Score []  - Use when only an EandM is performed on FOLLOW-UP visit 0 ASSESSMENTS - Nursing Assessment / Reassessment []  - Reassessment of Co-morbidities (includes updates in patient status) 0 X- 1 5 Reassessment of Adherence to Treatment Plan ASSESSMENTS - Wound and Skin Assessment / Reassessment X - Simple Wound  Assessment / Reassessment - one wound 1 5 []  - 0 Complex Wound Assessment / Reassessment - multiple wounds []  - 0 Dermatologic / Skin Assessment (not related to wound area) ASSESSMENTS - Focused Assessment []  - Circumferential Edema Measurements - multi extremities 0 []  - 0 Nutritional Assessment / Counseling / Intervention []  - 0 Lower Extremity Assessment (monofilament, tuning fork, pulses) []  - 0 Peripheral Arterial Disease Assessment (using hand held doppler) ASSESSMENTS - Ostomy and/or Continence Assessment and Care []  - Incontinence Assessment and Management 0 []  - 0 Ostomy Care Assessment and Management (repouching, etc.) PROCESS - Coordination of Care X - Simple Patient / Family Education for ongoing care 1 15 []  - 0 Complex (extensive) Patient / Family Education for ongoing care []  - 0 Staff obtains Programmer, systems, Records, Test Results / Process Orders []  - 0 Staff telephones HHA, Nursing Homes / Clarify orders / etc []  - 0 Routine Transfer to another Facility (non-emergent condition) []  - 0 Routine Hospital Admission (non-emergent condition) []  - 0 New Admissions / Biomedical engineer / Ordering NPWT, Apligraf, etc. []  - 0 Emergency Hospital Admission (emergent condition) X- 1 10 Simple Discharge Coordination NOVIS, LEAGUE ([id number]) []  - 0 Complex (extensive) Discharge Coordination PROCESS - Special Needs []  - Pediatric / Minor Patient Management 0 []  - 0 Isolation Patient Management []  - 0 Hearing / Language / Visual special needs []  - 0 Assessment of Community assistance (transportation, D/C planning, etc.) []  - 0 Additional assistance / Altered mentation []  - 0 Support Surface(s) Assessment (bed, cushion, seat, etc.) INTERVENTIONS - Wound Cleansing / Measurement X - Simple Wound Cleansing - one wound 1 5 []  - 0 Complex Wound Cleansing - multiple wounds X- 1 5 Wound Imaging (photographs - any number of wounds) []  - 0 Wound Tracing (instead of  photographs) X- 1 5 Simple Wound Measurement - one wound []  - 0 Complex Wound Measurement -  multiple wounds INTERVENTIONS - Wound Dressings []  - Small Wound Dressing one or multiple wounds 0 []  - 0 Medium Wound Dressing one or multiple wounds X- 1 20 Large Wound Dressing one or multiple wounds []  - 0 Application of Medications - topical []  - 0 Application of Medications - injection INTERVENTIONS - Miscellaneous []  - External ear exam 0 []  - 0 Specimen Collection (cultures, biopsies, blood, body fluids, etc.) []  - 0 Specimen(s) / Culture(s) sent or taken to Lab for analysis []  - 0 Patient Transfer (multiple staff / Civil Service fast streamer / Similar devices) []  - 0 Simple Staple / Suture removal (25 or less) []  - 0 Complex Staple / Suture removal (26 or more) []  - 0 Hypo / Hyperglycemic Management (close monitor of Blood Glucose) []  - 0 Ankle / Brachial Index (ABI) - do not check if billed separately X- 1 5 Vital Signs Tina [name], Tina T. ([id number]) Has the patient been seen at the hospital within the last three years: Yes Total Score: 75 Level Of Care: New/Established - Level 2 Electronic Signature(s) Signed: 01/17/2019 10:04:45 AM By: Gretta Cool, BSN, RN, CWS, Kim RN, BSN Entered By: Gretta Cool, BSN, RN, CWS, Kim on 01/15/2019 26:94:85 Tina [name] ([id number]) -------------------------------------------------------------------------------- Encounter Discharge Information Details Patient Name: Tina Leek T. Date of Service: 01/15/2019 8:00 AM Medical Record Number: [id number] Patient Account Number: [id number] Date of Birth/Sex: 24-Nov-1934 (84 y.o. F) Treating RN: Cornell Barman Primary Care Tonita Bills: Einar Pheasant Other Clinician: Referring Vincent Streater: Einar Pheasant Treating Hafsah Hendler/Extender: Tito Dine in Treatment: 5 Encounter Discharge Information Items Discharge Condition: Stable Ambulatory Status: Ambulatory Discharge Destination: Home Transportation: Private  Auto Accompanied By: self Schedule Follow-up Appointment: Yes Clinical Summary of Care: Electronic Signature(s) Signed: 01/17/2019 10:04:45 AM By: Gretta Cool, BSN, RN, CWS, Kim RN, BSN Entered By: Gretta Cool, BSN, RN, CWS, Kim on 01/15/2019 08:27:47 Tina [name] ([id number]) -------------------------------------------------------------------------------- Lower Extremity Assessment Details Patient Name: Tina Leek T. Date of Service: 01/15/2019 8:00 AM Medical Record Number: [id number] Patient Account Number: [id number] Date of Birth/Sex: 01-25-1935 (84 y.o. F) Treating RN: Army Melia Primary Care Ruthvik Barnaby: Einar Pheasant Other Clinician: Referring Camry Robello: Einar Pheasant Treating Aino Heckert/Extender: Ricard Dillon Weeks in Treatment: 5 Edema Assessment Assessed: [Left: No] [Right: No] Edema: [Left: N] [Right: o] Vascular Assessment Pulses: Dorsalis Pedis Palpable: [Right:Yes] Electronic Signature(s) Signed: 01/15/2019 10:38:13 AM By: Army Melia Entered By: Army Melia on 01/15/2019 08:12:02 Tina [name] ([id number]) -------------------------------------------------------------------------------- Multi Wound Chart Details Patient Name: Tina Leek T. Date of Service: 01/15/2019 8:00 AM Medical Record Number: [id number] Patient Account Number: [id number] Date of Birth/Sex: 11-12-1934 (84 y.o. F) Treating RN: Cornell Barman Primary Care Yalena Colon: Einar Pheasant Other Clinician: Referring Janeli Lewison: Einar Pheasant Treating Rubina Basinski/Extender: Tito Dine in Treatment: 5 Vital Signs Height(in): 65 Pulse(bpm): 10 Weight(lbs): 138 Blood Pressure(mmHg): 152/46 Body Mass Index(BMI): 23 Temperature(F): 98.7 Respiratory Rate 16 (breaths/min): Photos: [N/A:N/A] Wound Location: Right Lower Leg - Anterior N/A N/A Wounding Event: Trauma N/A N/A Primary Etiology: Skin Tear N/A N/A Comorbid History: Anemia, Chronic Obstructive N/A N/A Pulmonary Disease (COPD), Congestive  Heart Failure, Coronary Artery Disease, Hypertension, Received Chemotherapy, Received Radiation Date Acquired: 12/06/2018 N/A N/A Weeks of Treatment: 5 N/A N/A Wound Status: Open N/A N/A Measurements L x W x D 5.5x4.5x0.1 N/A N/A (cm) Area (cm) : 19.439 N/A N/A Volume (cm) : 1.944 N/A N/A % Reduction in Area: 55.20% N/A N/A % Reduction in Volume: 55.20% N/A N/A Classification: Full Thickness Without N/A N/A Exposed Support Structures Exudate Amount: Large N/A N/A Exudate  Type: Sanguinous N/A N/A Exudate Color: red N/A N/A Wound Margin: Flat and Intact N/A N/A Granulation Amount: Medium (34-66%) N/A N/A Granulation Quality: Red N/A N/A Necrotic Amount: Medium (34-66%) N/A N/A Exposed Structures: N/A N/A Tina [name], Tina [name] ([id number]) Fat Layer (Subcutaneous Tissue) Exposed: Yes Fascia: No Tendon: No Muscle: No Joint: No Bone: No Epithelialization: Small (1-33%) N/A N/A Treatment Notes Electronic Signature(s) Signed: 01/15/2019 5:28:46 PM By: Linton Ham MD Entered By: Linton Ham on 01/15/2019 08:21:57 Tina [name] ([id number]) -------------------------------------------------------------------------------- [place] Plan Details Patient Name: Tina [name]. Date of Service: 01/15/2019 8:00 AM Medical Record Number: [id number] Patient Account Number: [id number] Date of Birth/Sex: 12/23/1934 (84 y.o. F) Treating RN: Cornell Barman Primary Care Malissia Rabbani: Einar Pheasant Other Clinician: Referring Zyair Russi: Einar Pheasant Treating Jewelz Kobus/Extender: Tito Dine in Treatment: 5 Active Inactive Abuse / Safety / Falls / Self Care Management Nursing Diagnoses: History of Falls Goals: Patient will not experience any injury related to falls Date Initiated: 12/18/2018 Target Resolution Date: 01/01/2019 Goal Status: Active Patient will remain injury free related to falls Date Initiated: 12/18/2018 Target Resolution Date: 01/01/2019 Goal Status:  Active Interventions: Assess Activities of Daily Living upon admission and as needed Notes: Orientation to the Wound Care Program Nursing Diagnoses: Knowledge deficit related to the wound healing center program Goals: Patient/caregiver will verbalize understanding of the [place] Program Date Initiated: 12/18/2018 Target Resolution Date: 12/18/2018 Goal Status: Active Interventions: Provide education on orientation to the wound center Notes: Wound/Skin Impairment Nursing Diagnoses: Impaired tissue integrity Goals: Ulcer/skin breakdown will have a volume reduction of 30% by week 4 Date Initiated: 12/11/2018 Target Resolution Date: 01/11/2019 Goal Status: Active Tina [name], Tina [name] ([id number]) Interventions: Assess patient/caregiver ability to obtain necessary supplies Assess patient/caregiver ability to perform ulcer/skin care regimen upon admission and as needed Assess ulceration(s) every visit Notes: Electronic Signature(s) Signed: 01/17/2019 10:04:45 AM By: Gretta Cool, BSN, RN, CWS, Kim RN, BSN Entered By: Gretta Cool, BSN, RN, CWS, Kim on 01/15/2019 08:15:29 Tina [name] ([id number]) -------------------------------------------------------------------------------- Pain Assessment Details Patient Name: Tina Leek T. Date of Service: 01/15/2019 8:00 AM Medical Record Number: [id number] Patient Account Number: [id number] Date of Birth/Sex: 01-02-1935 (84 y.o. F) Treating RN: Army Melia Primary Care Marylu Dudenhoeffer: Einar Pheasant Other Clinician: Referring Estefanny Moler: Einar Pheasant Treating Mikeila Burgen/Extender: Tito Dine in Treatment: 5 Active Problems Location of Pain Severity and Description of Pain Patient Has Paino No Site Locations Pain Management and Medication Current Pain Management: Electronic Signature(s) Signed: 01/15/2019 10:38:13 AM By: Army Melia Entered By: Army Melia on 01/15/2019 08:07:40 Tina [name]  ([id number]) -------------------------------------------------------------------------------- Patient/Caregiver Education Details Patient Name: Tina [name]. Date of Service: 01/15/2019 8:00 AM Medical Record Number: [id number] Patient Account Number: [id number] Date of Birth/Gender: [birth year] (84 y.o. F) Treating RN: Cornell Barman Primary Care Physician: Einar Pheasant Other Clinician: Referring Physician: Einar Pheasant Treating Physician/Extender: Tito Dine in Treatment: 5 Education Assessment Education Provided To: Patient Education Topics Provided Wound/Skin Impairment: Handouts: Caring for Your Ulcer Methods: Demonstration, Explain/Verbal Responses: State content correctly Electronic Signature(s) Signed: 01/17/2019 10:04:45 AM By: Gretta Cool, BSN, RN, CWS, Kim RN, BSN Entered By: Gretta Cool, BSN, RN, CWS, Kim on 01/15/2019 08:21:39 Tina [name] ([id number]) -------------------------------------------------------------------------------- Wound Assessment Details Patient Name: Tina Leek T. Date of Service: 01/15/2019 8:00 AM Medical Record Number: [id number] Patient Account Number: [id number] Date of Birth/Sex: 11-Mar-1935 (84 y.o. F) Treating RN: Army Melia Primary Care Jarious Lyon: Einar Pheasant Other Clinician: Referring Demarion Pondexter: Einar Pheasant Treating Arthuro Canelo/Extender: Ricard Dillon Weeks in Treatment: 5  Wound Status Wound Number: 1 Primary Skin Tear Etiology: Wound Location: Right Lower Leg - Anterior Wound Open Wounding Event: Trauma Status: Date Acquired: 12/06/2018 Comorbid Anemia, Chronic Obstructive Pulmonary Disease Weeks Of Treatment: 5 History: (COPD), Congestive Heart Failure, Coronary Clustered Wound: No Artery Disease, Hypertension, Received Chemotherapy, Received Radiation Photos Wound Measurements Length: (cm) 5.5 Width: (cm) 4.5 Depth: (cm) 0.1 Area: (cm) 19.439 Volume: (cm) 1.944 % Reduction in Area: 55.2% % Reduction in  Volume: 55.2% Epithelialization: Small (1-33%) Tunneling: No Undermining: No Wound Description Full Thickness Without Exposed Support Classification: Structures Wound Margin: Flat and Intact Exudate Large Amount: Exudate Type: Sanguinous Exudate Color: red Foul Odor After Cleansing: No Slough/Fibrino No Wound Bed Granulation Amount: Medium (34-66%) Exposed Structure Granulation Quality: Red Fascia Exposed: No Necrotic Amount: Medium (34-66%) Fat Layer (Subcutaneous Tissue) Exposed: Yes Necrotic Quality: Adherent Slough Tendon Exposed: No Muscle Exposed: No Joint Exposed: No Bone Exposed: No Tina [name], Tina T. ([id number]) Treatment Notes Wound #1 (Right, Anterior Lower Leg) Notes mepitel, silver alginate, ABD, coban, tubigrip Electronic Signature(s) Signed: 01/15/2019 10:38:13 AM By: Army Melia Entered By: Army Melia on 01/15/2019 08:11:45 Tina [name] ([id number]) -------------------------------------------------------------------------------- Vitals Details Patient Name: Tina Leek T. Date of Service: 01/15/2019 8:00 AM Medical Record Number: [id number] Patient Account Number: [id number] Date of Birth/Sex: January 19, 1935 (84 y.o. F) Treating RN: Army Melia Primary Care Ashleen Demma: Einar Pheasant Other Clinician: Referring Johnetta Sloniker: Einar Pheasant Treating Akeen Ledyard/Extender: Tito Dine in Treatment: 5 Vital Signs Time Taken: 08:07 Temperature (F): 98.7 Height (in): 65 Pulse (bpm): 57 Weight (lbs): 138 Respiratory Rate (breaths/min): 16 Body Mass Index (BMI): 23 Blood Pressure (mmHg): 152/46 Reference Range: 80 - 120 mg / dl Electronic Signature(s) Signed: 01/15/2019 10:38:13 AM By: Army Melia Entered By: Army Melia on 01/15/2019 08:08:13

## 2019-01-17 NOTE — Progress Notes (Signed)
AARYN, PARRILLA ([id number]) Visit Report for 01/15/2019 HPI Details Patient Name: Tina [name], Tina [name]. Date of Service: 01/15/2019 8:00 AM Medical Record Number: [id number] Patient Account Number: [id number] Date of Birth/Sex: November 06, 1934 (84 y.o. F) Treating RN: Cornell Barman Primary Care Provider: Einar Pheasant Other Clinician: Referring Provider: Einar Pheasant Treating Provider/Extender: Tito Dine in Treatment: 5 History of Present Illness HPI Description: ADMISSION 12/11/2018 This is an 84 year old woman with multiple medical problems. She was in the ER on 7/25 after falling while going up brick stairs. She had a fairly substantial skin tear on her right anterior leg. The skin was reattached with Steri-Strips. She was told that it would "fall off". They put Curlex and Coban on this. She has had previous wounds on the left anterior leg that she was able to get the heel roughly 3 years ago. 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 8/5-Returns at 1 week right lacerated wound on the right anterior leg. Today the wound has the skin flap on it with hematoma below the skin flap line loosely on top. 8/12-Patient returns at 1 week after the skin flap and hematoma were addressed, we put her in 2 layer wrap and today the leg looks swollen and red, patient also complaining of some discomfort in that area 8/19; this is a patient with chronic venous insufficiency with stasis dermatitis and very fragile skin. She arrived with a substantial skin tear on the right anterior lower leg with subcutaneous hematoma. Eventually this was evacuated or evacuated on its own and I am not really sure. In any case the patient  arrives with a reasonably healthy looking wound on the right anterior mid tibia area. She is supposed to be using a contact layer and silver alginate her daughter is apparently changing this every second day. I do not think she is putting any compression on this. She certainly needs this however in spite of this the wound looks quite a bit better than when I saw this 3 weeks ago 8/26; the patient is making gradual progress with this wound with [place] in [place] as of epithelialization. He is using a contact layer silver alginate ABDs. She did not tolerate any form of compression. She talk to [name] about going to see vein and vascular relating to intra-abdominal issues. I had a quick look at this. 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 I do not think this has anything to do with our issues. 9/2; the patient's wound on the right anterior tibia area continues to make excellent progress. She is using Mepitel contact layer with silver alginate and foam she is changing this herself. She is going to check her intra-abdominal vascular issues next week. I note the ABI on the left leg [done because of pain in the right leg on admission] was only 0.72 however her wound on the right leg is improving and her peripheral pulses in her foot are easily palpable. I have not felt the need to track this down. She also has chronic venous insufficiency with hemosiderin deposition. She did not tolerate any degree of compression although I think compression might of been helpful in here  and healing this wound more quickly Electronic Signature(s) Signed: 01/15/2019 5:28:46 PM By: Linton Ham MD Entered By: Linton Ham on 01/15/2019 08:23:55 Tina [name] ([id number]) Tina [name] ([id number]) -------------------------------------------------------------------------------- Physical Exam Details Patient Name:  Tina [name]. Date of Service: 01/15/2019 8:00 AM Medical Record Number: [id number] Patient Account Number: [id number] Date of Birth/Sex: 10-14-1934 (84 y.o. F) Treating RN: Cornell Barman Primary Care Provider: Einar Pheasant Other Clinician: Referring Provider: Einar Pheasant Treating Provider/Extender: Tito Dine in Treatment: 5 Constitutional Patient is hypertensive.. Pulse regular and within target range for patient.Marland Kitchen Respirations regular, non-labored and within target range.. Temperature is normal and within the target range for the patient.Marland Kitchen appears in no distress. Eyes Conjunctivae clear. No discharge. Respiratory Respiratory effort is easy and symmetric bilaterally. Rate is normal at rest and on room air.. Cardiovascular . Pedal pulses are palpable on the right. There is edema and changes of chronic venous insufficiency in the right leg. Lymphatic None palpable in the right popliteal or inguinal area. Integumentary (Hair, Skin) Changes of chronic venous insufficiency. No erythema around the wound and no evidence of infection. Psychiatric No evidence of depression, anxiety, or agitation. Calm, cooperative, and communicative. Appropriate interactions and affect.. Notes Wound exam; area on the right anterior lower extremity. This continues to make excellent progress. Continued epithelialization and only small areas remain of open wound. There is some eschar on the lateral part of this I did not remove this again this week. Things seem to be progressing nicely. No evidence of infection Electronic Signature(s) Signed: 01/15/2019 5:28:46 PM By: Linton Ham MD Entered By: Linton Ham on 01/15/2019 08:26:31 Tina [name] ([id number]) -------------------------------------------------------------------------------- Physician Orders Details Patient Name: Tina Leek T. Date of Service: 01/15/2019 8:00 AM Medical Record Number: [id number] Patient Account Number:  [id number] Date of Birth/Sex: 1934/08/30 (84 y.o. F) Treating RN: Cornell Barman Primary Care Provider: Einar Pheasant Other Clinician: Referring Provider: Einar Pheasant Treating Provider/Extender: Tito Dine in Treatment: 5 Verbal / Phone Orders: No Diagnosis Coding Wound Cleansing Wound #1 Right,Anterior Lower Leg o Cleanse wound with mild soap and water o Dial antibacterial soap, wash wounds, rinse and pat dry prior to dressing wounds o May Shower, gently pat wound dry prior to applying new dressing. Anesthetic (add to Medication List) Wound #1 Right,Anterior Lower Leg o Topical Lidocaine 4% cream applied to wound bed prior to debridement (In Clinic Only). Primary Wound Dressing Wound #1 Right,Anterior Lower Leg o Mepitel One Contact layer o Silver Alginate Secondary Dressing Wound #1 Right,Anterior Lower Leg o ABD and Kerlix/Conform Dressing Change Frequency Wound #1 Right,Anterior Lower Leg o Change dressing every other day. Follow-up Appointments Wound #1 Right,Anterior Lower Leg o Return Appointment in 2 weeks. Edema Control Wound #1 Right,Anterior Lower Leg o Other: - Tubigrip F Electronic Signature(s) Signed: 01/15/2019 5:28:46 PM By: Linton Ham MD Signed: 01/17/2019 10:04:45 AM By: Gretta Cool, BSN, RN, CWS, Kim RN, BSN Entered By: Gretta Cool, BSN, RN, CWS, Kim on 01/15/2019 08:22:50 Tina [name] ([id number]) -------------------------------------------------------------------------------- Problem List Details Patient Name: Tina [name], Tina [name]. Date of Service: 01/15/2019 8:00 AM Medical Record Number: [id number] Patient Account Number: [id number] Date of Birth/Sex: [birth year] (84 y.o. F) Treating RN: Cornell Barman Primary Care Provider: Einar Pheasant Other Clinician: Referring Provider: Einar Pheasant Treating Provider/Extender: Tito Dine in Treatment: 5 Active Problems ICD-10 Evaluated Encounter Code Description Active  Date Today Diagnosis S80.11XD Contusion of right lower leg, subsequent encounter 12/11/2018 No Yes L97.818 Non-pressure chronic ulcer of other part of right lower  leg 12/11/2018 No Yes with other specified severity I87.331 Chronic venous hypertension (idiopathic) with ulcer and 12/11/2018 No Yes inflammation of right lower extremity Inactive Problems Resolved Problems Electronic Signature(s) Signed: 01/15/2019 5:28:46 PM By: Linton Ham MD Entered By: Linton Ham on 01/15/2019 08:21:42 Tina [name] ([id number]) -------------------------------------------------------------------------------- Progress Note Details Patient Name: Tina Leek T. Date of Service: 01/15/2019 8:00 AM Medical Record Number: [id number] Patient Account Number: [id number] Date of Birth/Sex: 14-Jan-1935 (84 y.o. F) Treating RN: Cornell Barman Primary Care Provider: Einar Pheasant Other Clinician: Referring Provider: Einar Pheasant Treating Provider/Extender: Tito Dine in Treatment: 5 Subjective History of Present Illness (HPI) ADMISSION 12/11/2018 This is an 84 year old woman with multiple medical problems. She was in the ER on 7/25 after falling while going up brick stairs. She had a fairly substantial skin tear on her right anterior leg. The skin was reattached with Steri-Strips. She was told that it would "fall off". They put Curlex and Coban on this. She has had previous wounds on the left anterior leg that she was able to get the heel roughly 3 years ago. 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 8/5-Returns at 1 week right lacerated wound on the right anterior leg. Today the wound has the skin flap on it with  hematoma below the skin flap line loosely on top. 8/12-Patient returns at 1 week after the skin flap and hematoma were addressed, we put her in 2 layer wrap and today the leg looks swollen and red, patient also complaining of some discomfort in that area 8/19; this is a patient with chronic venous insufficiency with stasis dermatitis and very fragile skin. She arrived with a substantial skin tear on the right anterior lower leg with subcutaneous hematoma. Eventually this was evacuated or evacuated on its own and I am not really sure. In any case the patient arrives with a reasonably healthy looking wound on the right anterior mid tibia area. She is supposed to be using a contact layer and silver alginate her daughter is apparently changing this every second day. I do not think she is putting any compression on this. She certainly needs this however in spite of this the wound looks quite a bit better than when I saw this 3 weeks ago 8/26; the patient is making gradual progress with this wound with [place] in [place] as of epithelialization. He is using a contact layer silver alginate ABDs. She did not tolerate any form of compression. She talk to [name] about going to see vein and vascular relating to intra-abdominal issues. I had a quick look at this. 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 I do not think this has anything to do with our issues. 9/2; the patient's wound on the right anterior tibia area continues to make excellent progress. She is using Mepitel contact layer with silver alginate and foam she is changing this herself. She is going to check her intra-abdominal vascular issues next week. I note the ABI on the left leg [done because of pain in the right leg on admission] was only 0.72 however her wound on the right leg is improving and her peripheral pulses  in her foot are easily palpable. I  have not felt the need to track this down. She also has chronic venous insufficiency with hemosiderin deposition. She did not tolerate any degree of compression although I think compression might of been helpful in here and healing this wound more quickly Objective Tina [name], Tina T. ([id number]) Constitutional Patient is hypertensive.. Pulse regular and within target range for patient.Marland Kitchen Respirations regular, non-labored and within target range.. Temperature is normal and within the target range for the patient.Marland Kitchen appears in no distress. Vitals Time Taken: 8:07 AM, Height: 65 in, Weight: 138 lbs, BMI: 23, Temperature: 98.7 F, Pulse: 57 bpm, Respiratory Rate: 16 breaths/min, Blood Pressure: 152/46 mmHg. Eyes Conjunctivae clear. No discharge. Respiratory Respiratory effort is easy and symmetric bilaterally. Rate is normal at rest and on room air.. Cardiovascular Pedal pulses are palpable on the right. There is edema and changes of chronic venous insufficiency in the right leg. Lymphatic None palpable in the right popliteal or inguinal area. Psychiatric No evidence of depression, anxiety, or agitation. Calm, cooperative, and communicative. Appropriate interactions and affect.. General Notes: Wound exam; area on the right anterior lower extremity. This continues to make excellent progress. Continued epithelialization and only small areas remain of open wound. There is some eschar on the lateral part of this I did not remove this again this week. Things seem to be progressing nicely. No evidence of infection Integumentary (Hair, Skin) Changes of chronic venous insufficiency. No erythema around the wound and no evidence of infection. Wound #1 status is Open. Original cause of wound was Trauma. The wound is located on the Right,Anterior Lower Leg. The wound measures 5.5cm length x 4.5cm width x 0.1cm depth; 19.439cm^2 area and 1.944cm^3 volume. There is Fat Layer (Subcutaneous Tissue) Exposed  exposed. There is no tunneling or undermining noted. There is a large amount of sanguinous drainage noted. The wound margin is flat and intact. There is medium (34-66%) red granulation within the wound bed. There is a medium (34-66%) amount of necrotic tissue within the wound bed including Adherent Slough. Assessment Active Problems ICD-10 Contusion of right lower leg, subsequent encounter Non-pressure chronic ulcer of other part of right lower leg with other specified severity Chronic venous hypertension (idiopathic) with ulcer and inflammation of right lower extremity Plan Wound Cleansing: Tina [name], Tina [name] ([id number]) Wound #1 Right,Anterior Lower Leg: Cleanse wound with mild soap and water Dial antibacterial soap, wash wounds, rinse and pat dry prior to dressing wounds May Shower, gently pat wound dry prior to applying new dressing. Anesthetic (add to Medication List): Wound #1 Right,Anterior Lower Leg: Topical Lidocaine 4% cream applied to wound bed prior to debridement (In Clinic Only). Primary Wound Dressing: Wound #1 Right,Anterior Lower Leg: Mepitel One Contact layer Silver Alginate Secondary Dressing: Wound #1 Right,Anterior Lower Leg: ABD and Kerlix/Conform Dressing Change Frequency: Wound #1 Right,Anterior Lower Leg: Change dressing every other day. Follow-up Appointments: Wound #1 Right,Anterior Lower Leg: Return Appointment in 2 weeks. Edema Control: Wound #1 Right,Anterior Lower Leg: Other: - Tubigrip F 1. We have continued with the same dressing. This is contact layer with Mepitel silver alginate and foam. kerlix and conform 2. She did not tolerate compression when she came in here. 3. Although she is at some risk for PAD her wound is making nice progress and her pedal pulses are palpable. For now we have elected not to send her for formal testing Electronic Signature(s) Signed: 01/15/2019 5:28:46 PM By: Linton Ham MD Entered By: Linton Ham on 01/15/2019  08:27:45 Tina [name],  Tina [name] ([id number]) -------------------------------------------------------------------------------- SuperBill Details Patient Name: Tina [name], Tina [name]. Date of Service: 01/15/2019 Medical Record Number: [id number] Patient Account Number: [id number] Date of Birth/Sex: 14-Feb-1935 (84 y.o. F) Treating RN: Cornell Barman Primary Care Provider: Einar Pheasant Other Clinician: Referring Provider: Einar Pheasant Treating Provider/Extender: Tito Dine in Treatment: 5 Diagnosis Coding ICD-10 Codes Code Description S80.11XD Contusion of right lower leg, subsequent encounter L97.818 Non-pressure chronic ulcer of other part of right lower leg with other specified severity I87.331 Chronic venous hypertension (idiopathic) with ulcer and inflammation of right lower extremity Facility Procedures CPT4 Code: [id number] Description: [phone] - WOUND CARE VISIT-LEV 2 EST PT Modifier: Quantity: 1 Physician Procedures CPT4: Description Modifier Quantity Code [id number] - WC PHYS LEVEL 3 - EST PT 1 ICD-10 Diagnosis Description S80.11XD Contusion of right lower leg, subsequent encounter L97.818 Non-pressure chronic ulcer of other part of right lower leg with other  specified severity I87.331 Chronic venous hypertension (idiopathic) with ulcer and inflammation of right lower extremity Electronic Signature(s) Signed: 01/15/2019 5:28:46 PM By: Linton Ham MD Entered By: Linton Ham on 01/15/2019 08:28:01

## 2019-01-21 ENCOUNTER — Other Ambulatory Visit: Payer: Self-pay

## 2019-01-21 ENCOUNTER — Other Ambulatory Visit
Admission: RE | Admit: 2019-01-21 | Discharge: 2019-01-21 | Disposition: A | Discharge status: Home / Self Care | Payer: Medicare Other | Source: Ambulatory Visit | Attending: Vascular Surgery | Admitting: Vascular Surgery

## 2019-01-21 DIAGNOSIS — Z01812 Encounter for preprocedural laboratory examination: Secondary | ICD-10-CM | POA: Insufficient documentation

## 2019-01-21 DIAGNOSIS — Z20828 Contact with and (suspected) exposure to other viral communicable diseases: Secondary | ICD-10-CM | POA: Diagnosis not present

## 2019-01-21 LAB — SARS CORONAVIRUS 2 (TAT 6-24 HRS): SARS Coronavirus 2: NEGATIVE

## 2019-01-22 ENCOUNTER — Other Ambulatory Visit (INDEPENDENT_AMBULATORY_CARE_PROVIDER_SITE_OTHER): Payer: Self-pay | Admitting: Nurse Practitioner

## 2019-01-23 ENCOUNTER — Encounter: Payer: Self-pay | Admitting: *Deleted

## 2019-01-23 ENCOUNTER — Encounter
Admission: RE | Disposition: A | Discharge status: Home / Self Care | Payer: Self-pay | Source: Home / Self Care | Attending: Vascular Surgery

## 2019-01-23 ENCOUNTER — Ambulatory Visit
Admission: RE | Admit: 2019-01-23 | Discharge: 2019-01-23 | Disposition: A | Discharge status: Home / Self Care | Payer: Medicare Other | Attending: Vascular Surgery | Admitting: Vascular Surgery

## 2019-01-23 ENCOUNTER — Other Ambulatory Visit: Payer: Self-pay

## 2019-01-23 DIAGNOSIS — J40 Bronchitis, not specified as acute or chronic: Secondary | ICD-10-CM | POA: Insufficient documentation

## 2019-01-23 DIAGNOSIS — J449 Chronic obstructive pulmonary disease, unspecified: Secondary | ICD-10-CM | POA: Insufficient documentation

## 2019-01-23 DIAGNOSIS — K559 Vascular disorder of intestine, unspecified: Secondary | ICD-10-CM | POA: Insufficient documentation

## 2019-01-23 DIAGNOSIS — K219 Gastro-esophageal reflux disease without esophagitis: Secondary | ICD-10-CM | POA: Insufficient documentation

## 2019-01-23 DIAGNOSIS — I2510 Atherosclerotic heart disease of native coronary artery without angina pectoris: Secondary | ICD-10-CM | POA: Insufficient documentation

## 2019-01-23 DIAGNOSIS — Z79899 Other long term (current) drug therapy: Secondary | ICD-10-CM | POA: Insufficient documentation

## 2019-01-23 DIAGNOSIS — I700 Atherosclerosis of aorta: Secondary | ICD-10-CM | POA: Insufficient documentation

## 2019-01-23 DIAGNOSIS — I110 Hypertensive heart disease with heart failure: Secondary | ICD-10-CM | POA: Diagnosis not present

## 2019-01-23 DIAGNOSIS — M810 Age-related osteoporosis without current pathological fracture: Secondary | ICD-10-CM | POA: Insufficient documentation

## 2019-01-23 DIAGNOSIS — Z87891 Personal history of nicotine dependence: Secondary | ICD-10-CM | POA: Insufficient documentation

## 2019-01-23 DIAGNOSIS — I509 Heart failure, unspecified: Secondary | ICD-10-CM | POA: Insufficient documentation

## 2019-01-23 DIAGNOSIS — E7800 Pure hypercholesterolemia, unspecified: Secondary | ICD-10-CM | POA: Insufficient documentation

## 2019-01-23 DIAGNOSIS — K55059 Acute (reversible) ischemia of intestine, part and extent unspecified: Secondary | ICD-10-CM | POA: Diagnosis not present

## 2019-01-23 DIAGNOSIS — Z7902 Long term (current) use of antithrombotics/antiplatelets: Secondary | ICD-10-CM | POA: Diagnosis not present

## 2019-01-23 DIAGNOSIS — I771 Stricture of artery: Secondary | ICD-10-CM | POA: Diagnosis not present

## 2019-01-23 HISTORY — PX: VISCERAL ANGIOGRAPHY: CATH118276

## 2019-01-23 LAB — CREATININE, SERUM
Creatinine, Ser: 1.13 mg/dL — ABNORMAL HIGH (ref 0.44–1.00)
GFR calc Af Amer: 52 mL/min — ABNORMAL LOW (ref >60)
GFR calc non Af Amer: 45 mL/min — ABNORMAL LOW (ref >60)

## 2019-01-23 LAB — BUN: BUN: 20 mg/dL (ref 8–23)

## 2019-01-23 SURGERY — VISCERAL ANGIOGRAPHY
Anesthesia: Moderate Sedation

## 2019-01-23 MED ORDER — MIDAZOLAM HCL 2 MG/2ML IJ SOLN
INTRAMUSCULAR | Status: DC | PRN
  Administered 2019-01-23: 2 mg via INTRAVENOUS

## 2019-01-23 MED ORDER — ONDANSETRON HCL 4 MG/2ML IJ SOLN: 4.0000 mg | Freq: Four times a day (QID) | INTRAMUSCULAR | Status: DC | PRN

## 2019-01-23 MED ORDER — ASPIRIN EC 81 MG PO TBEC: 81.0000 mg | DELAYED_RELEASE_TABLET | Freq: Every day | ORAL | 2 refills | Status: AC

## 2019-01-23 MED ORDER — CEFAZOLIN SODIUM-DEXTROSE 2-4 GM/100ML-% IV SOLN
2.0000 g | Freq: Once | INTRAVENOUS | Status: AC
  Administered 2019-01-23: 10:00:00 2 g via INTRAVENOUS

## 2019-01-23 MED ORDER — HYDROMORPHONE HCL 1 MG/ML IJ SOLN: 1.0000 mg | Freq: Once | INTRAMUSCULAR | Status: DC | PRN

## 2019-01-23 MED ORDER — HEPARIN SODIUM (PORCINE) 1000 UNIT/ML IJ SOLN
INTRAMUSCULAR | Status: AC
  Filled 2019-01-23: qty 1

## 2019-01-23 MED ORDER — MIDAZOLAM HCL 2 MG/ML PO SYRP: 8.0000 mg | ORAL_SOLUTION | Freq: Once | ORAL | Status: DC | PRN

## 2019-01-23 MED ORDER — SODIUM CHLORIDE 0.9 % IV SOLN
INTRAVENOUS | Status: DC
  Administered 2019-01-23: 10:00:00 via INTRAVENOUS

## 2019-01-23 MED ORDER — FENTANYL CITRATE (PF) 100 MCG/2ML IJ SOLN
INTRAMUSCULAR | Status: AC
  Filled 2019-01-23: qty 2

## 2019-01-23 MED ORDER — METHYLPREDNISOLONE SODIUM SUCC 125 MG IJ SOLR: 125.0000 mg | Freq: Once | INTRAMUSCULAR | Status: DC | PRN

## 2019-01-23 MED ORDER — HEPARIN SODIUM (PORCINE) 1000 UNIT/ML IJ SOLN
INTRAMUSCULAR | Status: DC | PRN
  Administered 2019-01-23 (×2): 2500 [IU] via INTRAVENOUS

## 2019-01-23 MED ORDER — DIPHENHYDRAMINE HCL 50 MG/ML IJ SOLN: 50.0000 mg | Freq: Once | INTRAMUSCULAR | Status: DC | PRN

## 2019-01-23 MED ORDER — CEFAZOLIN SODIUM-DEXTROSE 2-4 GM/100ML-% IV SOLN
INTRAVENOUS | Status: AC
  Administered 2019-01-23: 2 g via INTRAVENOUS
  Filled 2019-01-23: qty 100

## 2019-01-23 MED ORDER — FENTANYL CITRATE (PF) 100 MCG/2ML IJ SOLN
INTRAMUSCULAR | Status: DC | PRN
  Administered 2019-01-23: 50 ug via INTRAVENOUS

## 2019-01-23 MED ORDER — FAMOTIDINE 20 MG PO TABS: 40.0000 mg | ORAL_TABLET | Freq: Once | ORAL | Status: DC | PRN

## 2019-01-23 MED ORDER — MIDAZOLAM HCL 5 MG/5ML IJ SOLN
INTRAMUSCULAR | Status: AC
  Filled 2019-01-23: qty 5

## 2019-01-23 MED ORDER — IODIXANOL 320 MG/ML IV SOLN
INTRAVENOUS | Status: DC | PRN
  Administered 2019-01-23: 11:00:00 75 mL via INTRA_ARTERIAL

## 2019-01-23 SURGICAL SUPPLY — 18 items
BALLN LUTONIX 018 6X40X130 (BALLOONS) ×3
BALLOON LUTONIX 018 6X40X130 (BALLOONS) ×1 IMPLANT
CATH BEACON 5 .035 65 C2 TIP (CATHETERS) ×3 IMPLANT
CATH BEACON 5 .035 65 KMP TIP (CATHETERS) ×3 IMPLANT
CATH PIG 70CM (CATHETERS) ×3 IMPLANT
COVER PROBE U/S 5X48 (MISCELLANEOUS) ×3 IMPLANT
DEVICE PRESTO INFLATION (MISCELLANEOUS) ×3 IMPLANT
DEVICE STARCLOSE SE CLOSURE (Vascular Products) ×3 IMPLANT
DEVICE TORQUE .025-.038 (MISCELLANEOUS) ×3 IMPLANT
GLIDEWIRE ADV .035X180CM (WIRE) ×3 IMPLANT
GLIDEWIRE STIFF .35X180X3 HYDR (WIRE) ×3 IMPLANT
PACK ANGIOGRAPHY (CUSTOM PROCEDURE TRAY) ×3 IMPLANT
SHEATH BRITE TIP 5FRX11 (SHEATH) ×3 IMPLANT
SYR MEDRAD MARK 7 150ML (SYRINGE) ×3 IMPLANT
TUBING CONTRAST HIGH PRESS 72 (TUBING) ×3 IMPLANT
VALVE HEMO TOUHY BORST Y (ADAPTER) ×3 IMPLANT
WIRE G 018X200 V18 (WIRE) ×3 IMPLANT
WIRE J 3MM .035X145CM (WIRE) ×3 IMPLANT

## 2019-01-23 NOTE — Op Note (Signed)
[facility] Percutaneous Study/Intervention Procedural Note   Date of Surgery: 01/23/2019  Surgeon(s): Leotis Pain   Assistants:None  Pre-operative Diagnosis: SMA recurrent stenosis, chronic mesenteric ischemia Post-operative diagnosis: Same  Procedure(s) Performed: 1. Ultrasound guidance for vascular access right femoral artery 2. Catheter placement into SMA from right femoral approach 3. Aortogram and selective SMA angiogram 4. Percutaneous transluminal angioplasty of the SMA with a 6 mm diameter Lutonix drug-coated angioplasty balloon 5. StarClose closure device right femoral artery  Anesthesia: local with moderate conscious sedation for approximately 25 minutes using 2 mg of Versed and 50 Mcg of Fentanyl  Fluoro Time: 5 minutes  Contrast: 75 cc  Indications: Patient is an 84 year old female who had a an SMA stent placed about 2 years ago for high-grade stenosis with chronic visceral ischemia.  A recent noninvasive study suggested recurrent stenosis or occlusion of the stent with worsening of her aortic stenosis in that area.  She continues to have chronic visceral ischemic symptoms and is brought in for further evaluation.  Angiogram is performed to evaluate the lesion more thoroughly and potentially allow treatment. Risks and benefits were discussed and informed consent was obtained  Procedure: The patient was identified and appropriate procedural time out was performed. The patient was then placed supine on the table and prepped and draped in the usual sterile fashion.Moderate conscious sedation was administered during a face to face encounter with the patient with the RN monitoring their vital signs, mental status, telemetry and pulse oximetry throughout the procedure.  Ultrasound was used to evaluate the right common femoral artery. It was patent . A digital ultrasound image  was acquired. A Seldinger needle was used to access the right common femoral artery under direct ultrasound guidance and a permanent image was performed. A 0.035 J wire was advanced without resistance and a 5Fr sheath was placed. 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  This created a 70 to 80% stenosis in the visceral aorta itself.  There was significant right renal artery stenosis in the greater than 70% range.  There appeared to be codominant left renal arteries with the inferior left renal artery having greater than 60% stenosis.  The aorta normalized below the renal arteries and there was a large IMA providing collateral flow. Lateral projection view was performed which showed occlusion of the celiac artery and the chunk of calcium limiting blood flow into the SMA stent.  It was difficult to discern the degree of stenosis at the leading edge of the stent but it appeared to be 80% or more.  I then selective cannulated the SMA with a Kumpe catheter. Selective imaging of the superior mesenteric artery demonstrated good flow in the SMA beyond the proximal portion of the stent with the remainder of the stent being widely patent.  There is brisk flow in the more distal superior mesenteric artery beyond the stent. The patient was then given 5000 units of intravenous heparin.  Using the Kumpe catheter I exchanged for a 0.018 wire and parked this in the distal superior mesenteric artery.  A 6 mm diameter by 4 cm length Lutonix drug-coated angioplasty balloon was then inflated encompassing the previously placed stent and going back into the aorta.  A waist was seen with inflation but appeared to resolve at about 6 to 8 atm.  The balloon  was inflated to 8 Atm for 1 minute.  Significantly improved flow with what appeared to be less than 30%  residual stenosis although again it was difficult to see on completion angiogram and I elected to terminate the procedure. The catheter was removed and oblique arteriogram was performed of the right femoral artery. StarClose closure device was deployed in the usual fashion with excellent hemostatic result.  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  This created a 70 to 80% stenosis in the visceral aorta itself.  There was significant right renal artery stenosis in the greater than 70% range.  There appeared to be codominant left renal arteries with the inferior left renal artery having greater than 60% stenosis.  The aorta normalized below the renal arteries and there was a large IMA providing collateral flow.  Selective imaging showed stenosis at the leading edge of the previously placed SMA stent back into the aorta.  The degree of stenosis was difficult to discern but appeared to be greater than 80%.  Beyond the stenosis at the proximal portion of the stent, the SMA was patent with brisk flow.      Disposition: Patient was taken to the recovery room in stable condition having tolerated the procedure well.   Leotis Pain 01/23/2019 10:54 AM   This note was created with Dragon Medical transcription system. Any errors in dictation are purely unintentional.

## 2019-01-23 NOTE — Discharge Instructions (Signed)

## 2019-01-29 ENCOUNTER — Other Ambulatory Visit: Payer: Self-pay

## 2019-01-29 ENCOUNTER — Encounter: Payer: Medicare Other | Admitting: Internal Medicine

## 2019-01-29 DIAGNOSIS — I110 Hypertensive heart disease with heart failure: Secondary | ICD-10-CM | POA: Diagnosis not present

## 2019-01-29 DIAGNOSIS — L97819 Non-pressure chronic ulcer of other part of right lower leg with unspecified severity: Secondary | ICD-10-CM | POA: Diagnosis not present

## 2019-01-29 DIAGNOSIS — I509 Heart failure, unspecified: Secondary | ICD-10-CM | POA: Diagnosis not present

## 2019-01-29 DIAGNOSIS — S8011XD Contusion of right lower leg, subsequent encounter: Secondary | ICD-10-CM | POA: Diagnosis not present

## 2019-01-29 DIAGNOSIS — I87311 Chronic venous hypertension (idiopathic) with ulcer of right lower extremity: Secondary | ICD-10-CM | POA: Diagnosis not present

## 2019-01-29 DIAGNOSIS — S81811A Laceration without foreign body, right lower leg, initial encounter: Secondary | ICD-10-CM | POA: Diagnosis not present

## 2019-01-29 NOTE — Progress Notes (Signed)
JULIENE, KIRSH ([id number]) Visit Report for 01/29/2019 Arrival Information Details Patient Name: Tina [name], Tina [name]. Date of Service: 01/29/2019 2:00 PM Medical Record Number: [id number] Patient Account Number: [id number] Date of Birth/Sex: January 09, 1935 (84 y.o. F) Treating RN: Cornell Barman Primary Care Andreah Goheen: Einar Pheasant Other Clinician: Referring Basil Blakesley: Einar Pheasant Treating Tate Jerkins/Extender: Tito Dine in Treatment: 7 Visit Information History Since Last Visit Added or deleted any medications: Yes Patient Arrived: Ambulatory Any new allergies or adverse reactions: No Arrival Time: 14:08 Had a fall or experienced change in No Accompanied By: self activities of daily living that may affect Transfer Assistance: None risk of falls: Patient Identification Verified: Yes Signs or symptoms of abuse/neglect since last visito No Secondary Verification Process Completed: Yes Hospitalized since last visit: No Implantable device outside of the clinic excluding No cellular tissue based products placed in the center since last visit: Has Dressing in Place as Prescribed: Yes Has Compression in Place as Prescribed: Yes Pain Present Now: No Electronic Signature(s) Signed: 01/29/2019 2:23:38 PM By: Lorine Bears RCP, RRT, CHT Entered By: Lorine Bears on 01/29/2019 14:08:50 Tina [name] ([id number]) -------------------------------------------------------------------------------- Clinic Level of Care Assessment Details Patient Name: Tina Leek T. Date of Service: 01/29/2019 2:00 PM Medical Record Number: [id number] Patient Account Number: [id number] Date of Birth/Sex: December 10, 1934 (84 y.o. F) Treating RN: Montey Hora Primary Care Vash Quezada: Einar Pheasant Other Clinician: Referring Ervan Heber: Einar Pheasant Treating Caylan Chenard/Extender: Tito Dine in Treatment: 7 Clinic Level of Care Assessment Items TOOL 4 Quantity Score []  -  Use when only an EandM is performed on FOLLOW-UP visit 0 ASSESSMENTS - Nursing Assessment / Reassessment X - Reassessment of Co-morbidities (includes updates in patient status) 1 10 X- 1 5 Reassessment of Adherence to Treatment Plan ASSESSMENTS - Wound and Skin Assessment / Reassessment X - Simple Wound Assessment / Reassessment - one wound 1 5 []  - 0 Complex Wound Assessment / Reassessment - multiple wounds []  - 0 Dermatologic / Skin Assessment (not related to wound area) ASSESSMENTS - Focused Assessment []  - Circumferential Edema Measurements - multi extremities 0 []  - 0 Nutritional Assessment / Counseling / Intervention []  - 0 Lower Extremity Assessment (monofilament, tuning fork, pulses) []  - 0 Peripheral Arterial Disease Assessment (using hand held doppler) ASSESSMENTS - Ostomy and/or Continence Assessment and Care []  - Incontinence Assessment and Management 0 []  - 0 Ostomy Care Assessment and Management (repouching, etc.) PROCESS - Coordination of Care X - Simple Patient / Family Education for ongoing care 1 15 []  - 0 Complex (extensive) Patient / Family Education for ongoing care []  - 0 Staff obtains Programmer, systems, Records, Test Results / Process Orders []  - 0 Staff telephones HHA, Nursing Homes / Clarify orders / etc []  - 0 Routine Transfer to another Facility (non-emergent condition) []  - 0 Routine Hospital Admission (non-emergent condition) []  - 0 New Admissions / Biomedical engineer / Ordering NPWT, Apligraf, etc. []  - 0 Emergency Hospital Admission (emergent condition) X- 1 10 Simple Discharge Coordination Tina [name], Tina [name] ([id number]) []  - 0 Complex (extensive) Discharge Coordination PROCESS - Special Needs []  - Pediatric / Minor Patient Management 0 []  - 0 Isolation Patient Management []  - 0 Hearing / Language / Visual special needs []  - 0 Assessment of Community assistance (transportation, D/C planning, etc.) []  - 0 Additional assistance / Altered  mentation []  - 0 Support Surface(s) Assessment (bed, cushion, seat, etc.) INTERVENTIONS - Wound Cleansing / Measurement X - Simple Wound Cleansing - one wound 1 5 []  -  0 Complex Wound Cleansing - multiple wounds X- 1 5 Wound Imaging (photographs - any number of wounds) []  - 0 Wound Tracing (instead of photographs) X- 1 5 Simple Wound Measurement - one wound []  - 0 Complex Wound Measurement - multiple wounds INTERVENTIONS - Wound Dressings X - Small Wound Dressing one or multiple wounds 1 10 []  - 0 Medium Wound Dressing one or multiple wounds []  - 0 Large Wound Dressing one or multiple wounds []  - 0 Application of Medications - topical []  - 0 Application of Medications - injection INTERVENTIONS - Miscellaneous []  - External ear exam 0 []  - 0 Specimen Collection (cultures, biopsies, blood, body fluids, etc.) []  - 0 Specimen(s) / Culture(s) sent or taken to Lab for analysis []  - 0 Patient Transfer (multiple staff / Civil Service fast streamer / Similar devices) []  - 0 Simple Staple / Suture removal (25 or less) []  - 0 Complex Staple / Suture removal (26 or more) []  - 0 Hypo / Hyperglycemic Management (close monitor of Blood Glucose) []  - 0 Ankle / Brachial Index (ABI) - do not check if billed separately X- 1 5 Vital Signs Tina [name], Tina T. ([id number]) Has the patient been seen at the hospital within the last three years: Yes Total Score: 75 Level Of Care: New/Established - Level 2 Electronic Signature(s) Signed: 01/29/2019 4:36:54 PM By: Montey Hora Entered By: Montey Hora on 01/29/2019 14:54:36 Tina [name] ([id number]) -------------------------------------------------------------------------------- Lower Extremity Assessment Details Patient Name: Tina Leek T. Date of Service: 01/29/2019 2:00 PM Medical Record Number: [id number] Patient Account Number: [id number] Date of Birth/Sex: Oct 23, 1934 (84 y.o. F) Treating RN: Montey Hora Primary Care Gervis Gaba: Einar Pheasant  Other Clinician: Referring Trevante Tennell: Einar Pheasant Treating Nadine Ryle/Extender: Tito Dine in Treatment: 7 Edema Assessment Assessed: [Left: No] [Right: No] Edema: [Left: Ye] [Right: s] Calf Left: Right: Point of Measurement: 32 cm From Medial Instep cm 32.5 cm Ankle Left: Right: Point of Measurement: 10 cm From Medial Instep cm 24.8 cm Vascular Assessment Pulses: Dorsalis Pedis Palpable: [Right:Yes] Posterior Tibial Palpable: [Right:Yes] Electronic Signature(s) Signed: 01/29/2019 4:36:54 PM By: Montey Hora Entered By: Montey Hora on 01/29/2019 14:24:49 Tina [name] ([id number]) -------------------------------------------------------------------------------- Multi Wound Chart Details Patient Name: Tina Leek T. Date of Service: 01/29/2019 2:00 PM Medical Record Number: [id number] Patient Account Number: [id number] Date of Birth/Sex: 13-Mar-1935 (84 y.o. F) Treating RN: Montey Hora Primary Care Danie Diehl: Einar Pheasant Other Clinician: Referring Mikah Rottinghaus: Einar Pheasant Treating Eilah Common/Extender: Tito Dine in Treatment: 7 Vital Signs Height(in): 65 Pulse(bpm): 60 Weight(lbs): 138 Blood Pressure(mmHg): 150/45 Body Mass Index(BMI): 23 Temperature(F): 98.4 Respiratory Rate 16 (breaths/min): Photos: [N/A:N/A] Wound Location: Right Lower Leg - Anterior N/A N/A Wounding Event: Trauma N/A N/A Primary Etiology: Skin Tear N/A N/A Comorbid History: Anemia, Chronic Obstructive N/A N/A Pulmonary Disease (COPD), Congestive Heart Failure, Coronary Artery Disease, Hypertension, Received Chemotherapy, Received Radiation Date Acquired: 12/06/2018 N/A N/A Weeks of Treatment: 7 N/A N/A Wound Status: Healed - Epithelialized N/A N/A Measurements L x W x D 0x0x0 N/A N/A (cm) Area (cm) : 0 N/A N/A Volume (cm) : 0 N/A N/A % Reduction in Area: 100.00% N/A N/A % Reduction in Volume: 100.00% N/A N/A Classification: Full Thickness Without  N/A N/A Exposed Support Structures Exudate Amount: Medium N/A N/A Exudate Type: Sanguinous N/A N/A Exudate Color: red N/A N/A Wound Margin: Flat and Intact N/A N/A Granulation Amount: None Present (0%) N/A N/A Necrotic Amount: None Present (0%) N/A N/A Exposed Structures: Fat Layer (Subcutaneous N/A N/A Tissue) Exposed: Yes Tina [name],  Tina [name] ([id number]) Fascia: No Tendon: No Muscle: No Joint: No Bone: No Epithelialization: Large (67-100%) N/A N/A Treatment Notes Electronic Signature(s) Signed: 01/29/2019 5:11:23 PM By: Linton Ham MD Entered By: Linton Ham on 01/29/2019 15:40:51 Tina [name] ([id number]) -------------------------------------------------------------------------------- [place] Details Patient Name: Tina Leek T. Date of Service: 01/29/2019 2:00 PM Medical Record Number: [id number] Patient Account Number: [id number] Date of Birth/Sex: May 07, 1935 (84 y.o. F) Treating RN: Montey Hora Primary Care Teryn Boerema: Einar Pheasant Other Clinician: Referring Azusena Erlandson: Einar Pheasant Treating Akiera Allbaugh/Extender: Tito Dine in Treatment: 7 Active Inactive Electronic Signature(s) Signed: 01/29/2019 4:36:54 PM By: Montey Hora Entered By: Montey Hora on 01/29/2019 14:50:51 Tina [name] ([id number]) -------------------------------------------------------------------------------- Pain Assessment Details Patient Name: Tina Leek T. Date of Service: 01/29/2019 2:00 PM Medical Record Number: [id number] Patient Account Number: [id number] Date of Birth/Sex: [birth year] (84 y.o. F) Treating RN: Cornell Barman Primary Care Valene Villa: Einar Pheasant Other Clinician: Referring Audrey Eller: Einar Pheasant Treating Shakila Mak/Extender: Tito Dine in Treatment: 7 Active Problems Location of Pain Severity and Description of Pain Patient Has Paino No Site Locations Pain Management and Medication Current Pain  Management: Electronic Signature(s) Signed: 01/29/2019 2:23:38 PM By: Lorine Bears RCP, RRT, CHT Signed: 01/29/2019 5:22:27 PM By: Gretta Cool, BSN, RN, CWS, Kim RN, BSN Entered By: Lorine Bears on 01/29/2019 14:09:26 Tina [name] ([id number]) -------------------------------------------------------------------------------- Patient/Caregiver Education Details Patient Name: Tina [name]. Date of Service: 01/29/2019 2:00 PM Medical Record Number: [id number] Patient Account Number: [id number] Date of Birth/Gender: 07/28/1934 (84 y.o. F) Treating RN: Montey Hora Primary Care Physician: Einar Pheasant Other Clinician: Referring Physician: Einar Pheasant Treating Physician/Extender: Tito Dine in Treatment: 7 Education Assessment Education Provided To: Patient Education Topics Provided Venous: Handouts: Controlling Swelling with Compression Stockings Methods: Demonstration Responses: State content correctly Electronic Signature(s) Signed: 01/29/2019 4:36:54 PM By: Montey Hora Entered By: Montey Hora on 01/29/2019 14:55:10 Tina [name] ([id number]) -------------------------------------------------------------------------------- Wound Assessment Details Patient Name: Tina Leek T. Date of Service: 01/29/2019 2:00 PM Medical Record Number: [id number] Patient Account Number: [id number] Date of Birth/Sex: 29-Jul-1934 (84 y.o. F) Treating RN: Montey Hora Primary Care Crewe Heathman: Einar Pheasant Other Clinician: Referring Amry Cathy: Einar Pheasant Treating Jaron Czarnecki/Extender: Tito Dine in Treatment: 7 Wound Status Wound Number: 1 Primary Skin Tear Etiology: Wound Location: Right Lower Leg - Anterior Wound Healed - Epithelialized Wounding Event: Trauma Status: Date Acquired: 12/06/2018 Comorbid Anemia, Chronic Obstructive Pulmonary Disease Weeks Of Treatment: 7 History: (COPD), Congestive Heart Failure,  Coronary Clustered Wound: No Artery Disease, Hypertension, Received Chemotherapy, Received Radiation Photos Wound Measurements Length: (cm) 0 Width: (cm) 0 Depth: (cm) 0 Area: (cm) 0 Volume: (cm) 0 % Reduction in Area: 100% % Reduction in Volume: 100% Epithelialization: Large (67-100%) Tunneling: No Undermining: No Wound Description Full Thickness Without Exposed Support Foul Classification: Structures Sloug Wound Margin: Flat and Intact Exudate Medium Amount: Exudate Type: Sanguinous Exudate Color: red Odor After Cleansing: No h/Fibrino No Wound Bed Granulation Amount: None Present (0%) Exposed Structure Necrotic Amount: None Present (0%) Fascia Exposed: No Fat Layer (Subcutaneous Tissue) Exposed: Yes Tendon Exposed: No Muscle Exposed: No Joint Exposed: No Bone Exposed: No Tina [name], Tina [name] ([id number]) Electronic Signature(s) Signed: 01/29/2019 4:36:54 PM By: Montey Hora Entered By: Montey Hora on 01/29/2019 14:50:17 Tina [name] ([id number]) -------------------------------------------------------------------------------- [place] Details Patient Name: Tina Leek T. Date of Service: 01/29/2019 2:00 PM Medical Record Number: [id number] Patient Account Number: [id number] Date of Birth/Sex: 1935/04/15 (84 y.o. F) Treating RN: Cornell Barman Primary Care Flora Ratz: Einar Pheasant  Other Clinician: Referring Araseli Sherry: Einar Pheasant Treating Tykee Heideman/Extender: Tito Dine in Treatment: 7 Vital Signs Time Taken: 14:10 Temperature (F): 98.4 Height (in): 65 Pulse (bpm): 60 Weight (lbs): 138 Respiratory Rate (breaths/min): 16 Body Mass Index (BMI): 23 Blood Pressure (mmHg): 150/45 Reference Range: 80 - 120 mg / dl Electronic Signature(s) Signed: 01/29/2019 2:23:38 PM By: Lorine Bears RCP, RRT, CHT Entered By: Lorine Bears on 01/29/2019 14:11:31

## 2019-01-29 NOTE — Progress Notes (Signed)
Tina [name] ([id number]) Visit Report for 01/29/2019 HPI Details Patient Name: Tina [name], Tina [name]. Date of Service: 01/29/2019 2:00 PM Medical Record Number: [id number] Patient Account Number: [id number] Date of Birth/Sex: 09-20-1934 (84 y.o. F) Treating RN: Cornell Barman Primary Care Provider: Einar Pheasant Other Clinician: Referring Provider: Einar Pheasant Treating Provider/Extender: Tito Dine in Treatment: 7 History of Present Illness HPI Description: ADMISSION 12/11/2018 This is an 84 year old woman with multiple medical problems. She was in the ER on 7/25 after falling while going up brick stairs. She had a fairly substantial skin tear on her right anterior leg. The skin was reattached with Steri-Strips. She was told that it would "fall off". They put Curlex and Coban on this. She has had previous wounds on the left anterior leg that she was able to get the heel roughly 3 years ago. 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 8/5-Returns at 1 week right lacerated wound on the right anterior leg. Today the wound has the skin flap on it with hematoma below the skin flap line loosely on top. 8/12-Patient returns at 1 week after the skin flap and hematoma were addressed, we put her in 2 layer wrap and today the leg looks swollen and red, patient also complaining of some discomfort in that area 8/19; this is a patient with chronic venous insufficiency with stasis dermatitis and very fragile skin. She arrived with a substantial skin tear on the right anterior lower leg with subcutaneous hematoma. Eventually this was evacuated or evacuated on its own and I am not really sure. In any case the patient  arrives with a reasonably healthy looking wound on the right anterior mid tibia area. She is supposed to be using a contact layer and silver alginate her daughter is apparently changing this every second day. I do not think she is putting any compression on this. She certainly needs this however in spite of this the wound looks quite a bit better than when I saw this 3 weeks ago 8/26; the patient is making gradual progress with this wound with [place] in [place] as of epithelialization. He is using a contact layer silver alginate ABDs. She did not tolerate any form of compression. She talk to [name] about going to see vein and vascular relating to intra-abdominal issues. I had a quick look at this. 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 I do not think this has anything to do with our issues. 9/2; the patient's wound on the right anterior tibia area continues to make excellent progress. She is using Mepitel contact layer with silver alginate and foam she is changing this herself. She is going to check her intra-abdominal vascular issues next week. I note the ABI on the left leg [done because of pain in the right leg on admission] was only 0.72 however her wound on the right leg is improving and her peripheral pulses in her foot are easily palpable. I have not felt the need to track this down. She also has chronic venous insufficiency with hemosiderin deposition. She did not tolerate any degree of compression although I think compression might of been helpful in here  and healing this wound more quickly 9/16; the patient's wound on the right anterior tibia is epithelialized. She has very vulnerable damaged skin in this area nevertheless I could not talk her into compression stockings. She has stockings at home that she bought from [place] which are probably support hose I asked her to consider wearing these  during the day and to lubricate her skin at night Electronic Signature(s) Signed: 01/29/2019 5:11:23 PM By: Linton Ham MD [name] ([id number]) Entered By: Linton Ham on 01/29/2019 15:41:42 [name] ([id number]) -------------------------------------------------------------------------------- Physical Exam Details Patient Name: Tina Leek T. Date of Service: 01/29/2019 2:00 PM Medical Record Number: [id number] Patient Account Number: [id number] Date of Birth/Sex: November 16, 1934 (84 y.o. F) Treating RN: Cornell Barman Primary Care Provider: Einar Pheasant Other Clinician: Referring Provider: Einar Pheasant Treating Provider/Extender: Tito Dine in Treatment: 7 Constitutional Patient is hypertensive.. Pulse regular and within target range for patient.Marland Kitchen Respirations regular, non-labored and within target range.. Temperature is normal and within the target range for the patient.Marland Kitchen appears in no distress. Cardiovascular Pedal pulses on the right are palpable. Notes Wound exam; area on the right anterior lower extremity. This is fully epithelialized today. The area where the wound was and also surrounding the area anteriorly looks vulnerable with significant hemosiderin deposition very damaged skin in this area. We have excellent edema control. Electronic Signature(s) Signed: 01/29/2019 5:11:23 PM By: Linton Ham MD Entered By: Linton Ham on 01/29/2019 15:42:42 [name] ([id number]) -------------------------------------------------------------------------------- Physician Orders Details Patient Name: Tina Leek T. Date of Service: 01/29/2019 2:00 PM Medical Record Number: [id number] Patient Account Number: [id number] Date of Birth/Sex: 1935/01/05 (84 y.o. F) Treating RN: Montey Hora Primary Care Provider: Einar Pheasant Other Clinician: Referring Provider: Einar Pheasant Treating Provider/Extender: Tito Dine in Treatment:  7 Verbal / Phone Orders: No Diagnosis Coding Edema Control o Patient to wear own compression stockings Discharge From [place] o Discharge from [place] complete Electronic Signature(s) Signed: 01/29/2019 4:36:54 PM By: Montey Hora Signed: 01/29/2019 5:11:23 PM By: Linton Ham MD Entered By: Montey Hora on 01/29/2019 14:54:12 [name] ([id number]) -------------------------------------------------------------------------------- Problem List Details Patient Name: Tina Leek T. Date of Service: 01/29/2019 2:00 PM Medical Record Number: [id number] Patient Account Number: [id number] Date of Birth/Sex: 02-28-1935 (84 y.o. F) Treating RN: Cornell Barman Primary Care Provider: Einar Pheasant Other Clinician: Referring Provider: Einar Pheasant Treating Provider/Extender: Tito Dine in Treatment: 7 Active Problems ICD-10 Evaluated Encounter Code Description Active Date Today Diagnosis S80.11XD Contusion of right lower leg, subsequent encounter 12/11/2018 No Yes L97.818 Non-pressure chronic ulcer of other part of right lower leg 12/11/2018 No Yes with other specified severity I87.331 Chronic venous hypertension (idiopathic) with ulcer and 12/11/2018 No Yes inflammation of right lower extremity Inactive Problems Resolved Problems Electronic Signature(s) Signed: 01/29/2019 5:11:23 PM By: Linton Ham MD Entered By: Linton Ham on 01/29/2019 15:40:39 [name] ([id number]) -------------------------------------------------------------------------------- Progress Note Details Patient Name: Tina Leek T. Date of Service: 01/29/2019 2:00 PM Medical Record Number: [id number] Patient Account Number: [id number] Date of Birth/Sex: [birth year] (84 y.o. F) Treating RN: Cornell Barman Primary Care Provider: Einar Pheasant Other Clinician: Referring Provider: Einar Pheasant Treating Provider/Extender: Tito Dine in  Treatment: 7 Subjective History of Present Illness (HPI) ADMISSION 12/11/2018 This is an 84 year old woman with multiple medical problems. She was in the ER on 7/25 after falling while going up brick stairs. She had a fairly substantial skin tear on her right anterior leg. The  skin was reattached with Steri-Strips. She was told that it would "fall off". They put Curlex and Coban on this. She has had previous wounds on the left anterior leg that she was able to get the heel roughly 3 years ago. 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 8/5-Returns at 1 week right lacerated wound on the right anterior leg. Today the wound has the skin flap on it with hematoma below the skin flap line loosely on top. 8/12-Patient returns at 1 week after the skin flap and hematoma were addressed, we put her in 2 layer wrap and today the leg looks swollen and red, patient also complaining of some discomfort in that area 8/19; this is a patient with chronic venous insufficiency with stasis dermatitis and very fragile skin. She arrived with a substantial skin tear on the right anterior lower leg with subcutaneous hematoma. Eventually this was evacuated or evacuated on its own and I am not really sure. In any case the patient arrives with a reasonably healthy looking wound on the right anterior mid tibia area. She is supposed to be using a contact layer and silver alginate her daughter is apparently changing this every second day. I do not think she is putting any compression on this. She certainly needs this however in spite of this the wound looks quite a bit better than when I saw this 3 weeks ago 8/26; the patient is making gradual  progress with this wound with [place] in [place] as of epithelialization. He is using a contact layer silver alginate ABDs. She did not tolerate any form of compression. She talk to [name] about going to see vein and vascular relating to intra-abdominal issues. I had a quick look at this. 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 I do not think this has anything to do with our issues. 9/2; the patient's wound on the right anterior tibia area continues to make excellent progress. She is using Mepitel contact layer with silver alginate and foam she is changing this herself. She is going to check her intra-abdominal vascular issues next week. I note the ABI on the left leg [done because of pain in the right leg on admission] was only 0.72 however her wound on the right leg is improving and her peripheral pulses in her foot are easily palpable. I have not felt the need to track this down. She also has chronic venous insufficiency with hemosiderin deposition. She did not tolerate any degree of compression although I think compression might of been helpful in here and healing this wound more quickly 9/16; the patient's wound on the right anterior tibia is epithelialized. She has very vulnerable damaged skin in this area nevertheless I could not talk her into compression stockings. She has stockings at home that she bought from [place] which are probably support hose I asked her to consider wearing these during the day and to lubricate her skin at night Tina [name], Tina T. ([id number]) Objective Constitutional Patient is hypertensive.. Pulse regular and within target range for patient.Marland Kitchen Respirations regular, non-labored and within target range.. Temperature is normal and within the target range for the patient.Marland Kitchen appears in  no distress. Vitals Time Taken: 2:10 PM, Height: 65 in, Weight: 138 lbs, BMI: 23, Temperature:  98.4 F, Pulse: 60 bpm, Respiratory Rate: 16 breaths/min, Blood Pressure: 150/45 mmHg. Cardiovascular Pedal pulses on the right are palpable. General Notes: Wound exam; area on the right anterior lower extremity. This is fully epithelialized today. The area where the wound was and also surrounding the area anteriorly looks vulnerable with significant hemosiderin deposition very damaged skin in this area. We have excellent edema control. Integumentary (Hair, Skin) Wound #1 status is Healed - Epithelialized. Original cause of wound was Trauma. The wound is located on the Right,Anterior Lower Leg. The wound measures 0cm length x 0cm width x 0cm depth; 0cm^2 area and 0cm^3 volume. There is Fat Layer (Subcutaneous Tissue) Exposed exposed. There is no tunneling or undermining noted. There is a medium amount of sanguinous drainage noted. The wound margin is flat and intact. There is no granulation within the wound bed. There is no necrotic tissue within the wound bed. Assessment Active Problems ICD-10 Contusion of right lower leg, subsequent encounter Non-pressure chronic ulcer of other part of right lower leg with other specified severity Chronic venous hypertension (idiopathic) with ulcer and inflammation of right lower extremity Plan Edema Control: Patient to wear own compression stockings Discharge From [facility] Services: Discharge from [place] complete 1. The patient can be discharged from the wound care center 2. I asked her to consider wearing her support stockings during the day. I have my doubts that she will comply with this. I warned her that any degree of even minor trauma would likely result in significant injury in this area Tina [name], Tina [name] ([id number]) Electronic Signature(s) Signed: 01/29/2019 5:11:23 PM By: Linton Ham MD Entered By: Linton Ham on 01/29/2019 15:43:31 Orner, Consuello Bossier  ([id number]) -------------------------------------------------------------------------------- SuperBill Details Patient Name: Tina Leek T. Date of Service: 01/29/2019 Medical Record Number: [id number] Patient Account Number: [id number] Date of Birth/Sex: Feb 04, 1935 (84 y.o. F) Treating RN: Cornell Barman Primary Care Provider: Einar Pheasant Other Clinician: Referring Provider: Einar Pheasant Treating Provider/Extender: Tito Dine in Treatment: 7 Diagnosis Coding ICD-10 Codes Code Description S80.11XD Contusion of right lower leg, subsequent encounter L97.818 Non-pressure chronic ulcer of other part of right lower leg with other specified severity I87.331 Chronic venous hypertension (idiopathic) with ulcer and inflammation of right lower extremity Facility Procedures CPT4 Code: [id number] Description: [phone] - WOUND CARE VISIT-LEV 2 EST PT Modifier: Quantity: 1 Physician Procedures CPT4: Description Modifier Quantity Code [id number] - WC PHYS LEVEL 2 - EST PT 1 ICD-10 Diagnosis Description S80.11XD Contusion of right lower leg, subsequent encounter L97.818 Non-pressure chronic ulcer of other part of right lower leg with other  specified severity I87.331 Chronic venous hypertension (idiopathic) with ulcer and inflammation of right lower extremity Electronic Signature(s) Signed: 01/29/2019 5:11:23 PM By: Linton Ham MD Entered By: Linton Ham on 01/29/2019 15:43:47

## 2019-02-21 ENCOUNTER — Ambulatory Visit (INDEPENDENT_AMBULATORY_CARE_PROVIDER_SITE_OTHER): Payer: Medicare Other | Admitting: Vascular Surgery

## 2019-02-21 ENCOUNTER — Other Ambulatory Visit: Payer: Self-pay

## 2019-02-21 ENCOUNTER — Encounter (INDEPENDENT_AMBULATORY_CARE_PROVIDER_SITE_OTHER): Payer: Self-pay | Admitting: Vascular Surgery

## 2019-02-21 VITALS — BP 142/64 | HR 67 | Resp 16 | Ht 64.0 in | Wt 137.0 lb

## 2019-02-21 DIAGNOSIS — I10 Essential (primary) hypertension: Secondary | ICD-10-CM | POA: Diagnosis not present

## 2019-02-21 DIAGNOSIS — K551 Chronic vascular disorders of intestine: Secondary | ICD-10-CM

## 2019-02-21 DIAGNOSIS — I700 Atherosclerosis of aorta: Secondary | ICD-10-CM

## 2019-02-21 DIAGNOSIS — I2510 Atherosclerotic heart disease of native coronary artery without angina pectoris: Secondary | ICD-10-CM

## 2019-02-21 NOTE — Progress Notes (Signed)
MRN : [id number]  Tina [name] is a 84 y.o. (1935/03/16) female who presents with chief complaint of  Chief Complaint  Patient presents with  . Follow-up    post op from mesenteric angio  .  History of Present Illness: Patient returns today in follow up of her aortic atherosclerosis in the visceral vessel level with recurrent SMA stenosis that we treated with angioplasty about a month ago.  That has resulted in her feeling some better and having more energy although she says her leg still give out on her and bother her on a daily basis.  She had no periprocedural complications.  Current Outpatient Medications  Medication Sig Dispense Refill  . albuterol (PROVENTIL HFA;VENTOLIN HFA) 108 (90 Base) MCG/ACT inhaler Inhale 2 puffs into the lungs every 6 (six) hours as needed for wheezing or shortness of breath. 1 Inhaler 2  . amLODipine (NORVASC) 5 MG tablet TAKE 1 TABLET BY MOUTH TWO  TIMES DAILY 180 tablet 1  . aspirin EC 81 MG tablet Take 1 tablet (81 mg total) by mouth daily. 150 tablet 2  . cholecalciferol (VITAMIN D) 1000 units tablet Take 1,000 Units by mouth daily.    . clopidogrel (PLAVIX) 75 MG tablet Take 1 tablet (75 mg total) by mouth daily. 30 tablet 0  . fluticasone (FLONASE) 50 MCG/ACT nasal spray USE 2 SPRAYS INTO BOTH  NOSTRILS DAILY AS NEEDED  FOR ALLERGIES. 48 g 1  . hydrALAZINE (APRESOLINE) 25 MG tablet Take 1 tablet (25 mg total) by mouth 3 (three) times daily. 90 tablet 2  . ipratropium-albuterol (DUONEB) 0.5-2.5 (3) MG/3ML SOLN Take 3 mLs by nebulization every 6 (six) hours as needed (for shortness of breath). 36 mL 0  . losartan (COZAAR) 100 MG tablet TAKE 1 TABLET BY MOUTH ONCE A DAY 90 tablet 1  . lovastatin (MEVACOR) 40 MG tablet TAKE 1 TABLET BY MOUTH  DAILY IN THE EVENING 90 tablet 1  . metoprolol tartrate (LOPRESSOR) 25 MG tablet TAKE 1 TABLET BY MOUTH  TWICE A DAY 180 tablet 1  . OVER THE COUNTER MEDICATION daily.    . potassium chloride (K-DUR) 10 MEQ tablet  TAKE 1 TABLET BY MOUTH  DAILY 90 tablet 1  . SYMBICORT 160-4.5 MCG/ACT inhaler USE 2 PUFFS TWO TIMES DAILY 30.6 g 3  . torsemide (DEMADEX) 20 MG tablet Take 20 mg by mouth every morning.  11   No current facility-administered medications for this visit.    Facility-Administered Medications Ordered in Other Visits  Medication Dose Route Frequency Provider Last Rate Last Dose  . heparin lock flush 100 unit/mL  500 Units Intravenous Once Sindy Guadeloupe, MD      . sodium chloride flush (NS) 0.9 % injection 10 mL  10 mL Intravenous PRN Sindy Guadeloupe, MD        Past Medical History:  Diagnosis Date  . Anemia   . Asthma   . Bladder cancer ([place])   . BRCA positive   . CAD (coronary artery disease)   . CHF (congestive heart failure) ([place])   . COPD (chronic obstructive pulmonary disease) ([place])   . Emphysema lung ([place])   . GERD (gastroesophageal reflux disease)   . History of colon polyps   . Hypercholesterolemia   . Hyperglycemia   . Hypertension   . Lung nodules   . Osteoporosis   . Personal history of tobacco use, presenting hazards to health 11/25/2014  . Polycythemia vera(238.4)   . Renal cyst   .  Skin cancer     Past Surgical History:  Procedure Laterality Date  . ANGIOPLASTY     coronary (x1)  . COLONOSCOPY WITH PROPOFOL N/A 03/02/2015   Procedure: COLONOSCOPY WITH PROPOFOL;  Surgeon: Lollie Sails, MD;  Location: [facility] ENDOSCOPY;  Service: Endoscopy;  Laterality: N/A;  . COLONOSCOPY WITH PROPOFOL N/A 02/13/2017   Procedure: COLONOSCOPY WITH PROPOFOL;  Surgeon: [name], MD;  Location: [facility] ENDOSCOPY;  Service: Endoscopy;  Laterality: N/A;  . CORONARY ANGIOPLASTY    . CORONARY ARTERY BYPASS GRAFT  09/17/2017   pt denies  . CYSTOSCOPY W/ RETROGRADES Bilateral 02/13/2017   Procedure: CYSTOSCOPY WITH RETROGRADE PYELOGRAM;  Surgeon: Hollice Espy, MD;  Location: [facility] ORS;  Service: Urology;  Laterality: Bilateral;  . CYSTOSCOPY W/ RETROGRADES Bilateral 09/19/2017    Procedure: CYSTOSCOPY WITH RETROGRADE PYELOGRAM;  Surgeon: Hollice Espy, MD;  Location: [facility] ORS;  Service: Urology;  Laterality: Bilateral;  . CYSTOSCOPY WITH BIOPSY  03/26/2017   Procedure: CYSTOSCOPY WITH BIOPSY;  Surgeon: Hollice Espy, MD;  Location: [facility] ORS;  Service: Urology;;  . Consuela Mimes WITH STENT PLACEMENT Bilateral 02/13/2017   Procedure: CYSTOSCOPY WITH STENT PLACEMENT;  Surgeon: Hollice Espy, MD;  Location: [facility] ORS;  Service: Urology;  Laterality: Bilateral;  . CYSTOSCOPY WITH STENT PLACEMENT Bilateral 03/26/2017   Procedure: CYSTOSCOPY WITH STENT EXCHANGE;  Surgeon: Hollice Espy, MD;  Location: [facility] ORS;  Service: Urology;  Laterality: Bilateral;  . ESOPHAGOGASTRODUODENOSCOPY (EGD) WITH PROPOFOL N/A 02/13/2017   Procedure: ESOPHAGOGASTRODUODENOSCOPY (EGD) WITH PROPOFOL;  Surgeon: [name], MD;  Location: [facility] ENDOSCOPY;  Service: Endoscopy;  Laterality: N/A;  . PORTA CATH INSERTION N/A 02/28/2017   Procedure: PORTA CATH INSERTION;  Surgeon: Algernon Huxley, MD;  Location: [place] CV LAB;  Service: Cardiovascular;  Laterality: N/A;  . PORTA CATH REMOVAL N/A 12/24/2017   Procedure: PORTA CATH REMOVAL;  Surgeon: Algernon Huxley, MD;  Location: [place] CV LAB;  Service: Cardiovascular;  Laterality: N/A;  . SALPINGOOPHORECTOMY    . TONSILECTOMY/ADENOIDECTOMY WITH MYRINGOTOMY    . TONSILLECTOMY    . TRANSURETHRAL RESECTION OF BLADDER TUMOR N/A 02/13/2017   Procedure: TRANSURETHRAL RESECTION OF BLADDER TUMOR (TURBT);  Surgeon: Hollice Espy, MD;  Location: [facility] ORS;  Service: Urology;  Laterality: N/A;  . TUBAL LIGATION    . UPPER GI ENDOSCOPY  02/13/2017  . URETEROSCOPY Left 09/19/2017   Procedure: URETEROSCOPY;  Surgeon: Hollice Espy, MD;  Location: [facility] ORS;  Service: Urology;  Laterality: Left;  Marland Kitchen VISCERAL ANGIOGRAPHY N/A 01/23/2019   Procedure: VISCERAL ANGIOGRAPHY;  Surgeon: Algernon Huxley, MD;  Location: [place] CV LAB;  Service: Cardiovascular;   Laterality: N/A;  . VISCERAL ARTERY INTERVENTION N/A 02/12/2017   Procedure: VISCERAL ARTERY INTERVENTION;  Surgeon: Algernon Huxley, MD;  Location: [place] CV LAB;  Service: Cardiovascular;  Laterality: N/A;    Social History Social History   Tobacco Use  . Smoking status: Former Smoker    Packs/day: 1.00    Years: 45.00    Pack years: 45.00    Types: Cigarettes    Quit date: 11/11/2003    Years since quitting: 15.2  . Smokeless tobacco: Never Used  Substance Use Topics  . Alcohol use: Not Currently    Alcohol/week: 0.0 standard drinks  . Drug use: No    Family History Family History  Problem Relation Age of Onset  . Cirrhosis Father        died age 48  . Alcohol abuse Father   . Asthma Mother   .  Congestive Heart Failure Mother   . Breast cancer Mother        2 (1/2 sisters)  . Osteoarthritis Mother   . Colon cancer Mother   . Lupus Sister   . Alcohol abuse Sister   . Ovarian cancer Sister   . Osteoporosis Sister   . Skin cancer Sister   . Breast cancer Cousin   . Breast cancer Maternal Aunt     Allergies  Allergen Reactions  . Evista [Raloxifene] Other (See Comments)    Night sweats   . Fluticasone-Salmeterol Other (See Comments)    Cough, "chokes me"      REVIEW OF SYSTEMS (Negative unless checked)  Constitutional: '[x]' Weight loss  '[]' Fever  '[]' Chills Cardiac: '[]' Chest pain   '[]' Chest pressure   '[]' Palpitations   '[]' Shortness of breath when laying flat   '[]' Shortness of breath at rest   '[x]' Shortness of breath with exertion. Vascular:  '[x]' Pain in legs with walking   '[]' Pain in legs at rest   '[]' Pain in legs when laying flat   '[x]' Claudication   '[]' Pain in feet when walking  '[]' Pain in feet at rest  '[]' Pain in feet when laying flat   '[]' History of DVT   '[]' Phlebitis   '[]' Swelling in legs   '[]' Varicose veins   '[]' Non-healing ulcers Pulmonary:   '[]' Uses home oxygen   '[]' Productive cough   '[]' Hemoptysis   '[]' Wheeze  '[x]' COPD   '[]' Asthma Neurologic:  '[]' Dizziness  '[]' Blackouts    '[]' Seizures   '[]' History of stroke   '[]' History of TIA  '[]' Aphasia   '[]' Temporary blindness   '[]' Dysphagia   '[]' Weakness or numbness in arms   '[]' Weakness or numbness in legs Musculoskeletal:  '[x]' Arthritis   '[]' Joint swelling   '[]' Joint pain   '[]' Low back pain Hematologic:  '[]' Easy bruising  '[]' Easy bleeding   '[]' Hypercoagulable state   '[]' Anemic   Gastrointestinal:  '[]' Blood in stool   '[]' Vomiting blood  '[x]' Gastroesophageal reflux/heartburn   '[x]' Abdominal pain Genitourinary:  '[]' Chronic kidney disease   '[]' Difficult urination  '[]' Frequent urination  '[]' Burning with urination   '[]' Hematuria Skin:  '[]' Rashes   '[]' Ulcers   '[]' Wounds Psychological:  '[]' History of anxiety   '[]'  History of major depression.  Physical Examination  BP (!) 142/64 (BP Location: Right Arm)   Pulse 67   Resp 16   Ht '5\' 4"'  (1.626 m)   Wt 137 lb (62.1 kg)   BMI 23.52 kg/m  Gen:  WD/WN, NAD Head: [place]/AT, No temporalis wasting. Ear/Nose/Throat: Hearing grossly intact, nares w/o erythema or drainage Eyes: Conjunctiva clear. Sclera non-icteric Neck: Supple.  Trachea midline Pulmonary:  Good air movement, no use of accessory muscles.  Cardiac: RRR, no JVD Vascular:  Vessel Right Left  Radial Palpable Palpable                          PT  1+ palpable  1+ palpable  DP  1+ palpable  1+ palpable   Gastrointestinal: soft, non-tender/non-distended. No guarding/reflex.  Musculoskeletal: M/S 5/5 throughout.  No deformity or atrophy.  No edema. Neurologic: Sensation grossly intact in extremities.  Symmetrical.  Speech is fluent.  Psychiatric: Judgment intact, Mood & affect appropriate for pt's clinical situation. Dermatologic: No rashes or ulcers noted.  No cellulitis or open wounds.       Labs Recent Results (from the past 2160 hour(s))  SARS CORONAVIRUS 2 (TAT 6-24 HRS) Nasopharyngeal Nasopharyngeal Swab     Status: None   Collection Time: 01/21/19 11:14 AM   Specimen: Nasopharyngeal  Swab  Result Value Ref Range   SARS Coronavirus 2  NEGATIVE NEGATIVE    Comment: (NOTE) SARS-CoV-2 target nucleic acids are NOT DETECTED. The SARS-CoV-2 RNA is generally detectable in upper and lower respiratory specimens during the acute phase of infection. Negative results do not preclude SARS-CoV-2 infection, do not rule out co-infections with other pathogens, and should not be used as the sole basis for treatment or other patient management decisions. Negative results must be combined with clinical observations, patient history, and epidemiological information. The expected result is Negative. Fact Sheet for Patients: SugarRoll.be Fact Sheet for Healthcare Providers: https://www.woods-mathews.com/ This test is not yet approved or cleared by the [place] FDA and  has been authorized for detection and/or diagnosis of SARS-CoV-2 by FDA under an Emergency Use Authorization (EUA). This EUA will remain  in effect (meaning this test can be used) for the duration of the COVID-19 declaration under Section 56 4(b)(1) of the Act, 21 U.S.C. section 360bbb-3(b)(1), unless the authorization is terminated or revoked sooner. Performed at [facility], [place] [address] [place], [place] [address]   BUN     Status: None   Collection Time: 01/23/19  9:37 AM  Result Value Ref Range   BUN 20 8 - 23 mg/dL    Comment: Performed at [facility], [place] [place], [place] [address] [address]  Creatinine, serum     Status: Abnormal   Collection Time: 01/23/19  9:37 AM  Result Value Ref Range   Creatinine, Ser 1.13 (H) 0.44 - 1.00 mg/dL   GFR calc non Af Amer 45 (L) >60 mL/min   GFR calc Af Amer 52 (L) >60 mL/min    Comment: Performed at [facility], [address], [place],  [address]    Radiology No results found.  Assessment/Plan  Essential hypertension blood pressure control important in reducing the progression of atherosclerotic disease. On appropriate oral  medications.   CAD (coronary artery disease) Continue cardiac and antihypertensive medications as already ordered and reviewed, no changes at this time. Continue statin as ordered and reviewed, no changes at this time Nitrates PRN for chest pain   Aortic atherosclerosis (HCC) Fairly severe calcific stenosis of the aorta around the visceral vessels that does not have an easy treatment.  She would be a very poor candidate for open surgical therapy in this location which would carry a very high morbidity and mortality.  We discussed this in some detail today.  We are going to assess her for infrainguinal vascular disease that may be more amenable to treatment.  Her distal aorta and iliac arteries did not have significant stenosis.  Mesenteric ischemia due to arterial insufficiency (HCC) Eating well and doing some better after her SMA angioplasty a month ago.  Plan to check this in 2 to 3 months with a duplex after we work-up her legs.    Leotis Pain, MD  02/21/2019 11:37 AM    This note was created with Dragon medical transcription system.  Any errors from dictation are purely unintentional

## 2019-02-21 NOTE — Assessment & Plan Note (Signed)
Eating well and doing some better after her SMA angioplasty a month ago.  Plan to check this in 2 to 3 months with a duplex after we work-up her legs.

## 2019-02-21 NOTE — Patient Instructions (Signed)
Peripheral Vascular Disease  Peripheral vascular disease (PVD) is a disease of the blood vessels that are not part of your heart and brain. A simple term for PVD is poor circulation. In most cases, PVD narrows the blood vessels that carry blood from your heart to the rest of your body. This can reduce the supply of blood to your arms, legs, and internal organs, like your stomach or kidneys. However, PVD most often affects a person's lower legs and feet. Without treatment, PVD tends to get worse. PVD can also lead to acute ischemic limb. This is when an arm or leg suddenly cannot get enough blood. This is a medical emergency. Follow these instructions at home: Lifestyle  Do not use any products that contain nicotine or tobacco, such as cigarettes and e-cigarettes. If you need help quitting, ask your doctor.  Lose weight if you are overweight. Or, stay at a healthy weight as told by your doctor.  Eat a diet that is low in fat and cholesterol. If you need help, ask your doctor.  Exercise regularly. Ask your doctor for activities that are right for you. General instructions  Take over-the-counter and prescription medicines only as told by your doctor.  Take good care of your feet: ? Wear comfortable shoes that fit well. ? Check your feet often for any cuts or sores.  Keep all follow-up visits as told by your doctor This is important. Contact a doctor if:  You have cramps in your legs when you walk.  You have leg pain when you are at rest.  You have coldness in a leg or foot.  Your skin changes.  You are unable to get or have an erection (erectile dysfunction).  You have cuts or sores on your feet that do not heal. Get help right away if:  Your arm or leg turns cold, numb, and blue.  Your arms or legs become red, warm, swollen, painful, or numb.  You have chest pain.  You have trouble breathing.  You suddenly have weakness in your face, arm, or leg.  You become very  confused or you cannot speak.  You suddenly have a very bad headache.  You suddenly cannot see. Summary  Peripheral vascular disease (PVD) is a disease of the blood vessels.  A simple term for PVD is poor circulation. Without treatment, PVD tends to get worse.  Treatment may include exercise, low fat and low cholesterol diet, and quitting smoking. This information is not intended to replace advice given to you by your health care provider. Make sure you discuss any questions you have with your health care provider. Document Released: 07/26/2009 Document Revised: 04/13/2017 Document Reviewed: 06/08/2016 Elsevier Patient Education  2020 Elsevier Inc.  

## 2019-02-21 NOTE — Assessment & Plan Note (Signed)
Continue cardiac and antihypertensive medications as already ordered and reviewed, no changes at this time. Continue statin as ordered and reviewed, no changes at this time Nitrates PRN for chest pain  

## 2019-02-21 NOTE — Assessment & Plan Note (Signed)
blood pressure control important in reducing the progression of atherosclerotic disease. On appropriate oral medications.  

## 2019-02-21 NOTE — Assessment & Plan Note (Addendum)
Fairly severe calcific stenosis of the aorta around the visceral vessels that does not have an easy treatment.  She would be a very poor candidate for open surgical therapy in this location which would carry a very high morbidity and mortality.  We discussed this in some detail today.  We are going to assess her for infrainguinal vascular disease that may be more amenable to treatment.  Her distal aorta and iliac arteries did not have significant stenosis.

## 2019-02-26 ENCOUNTER — Telehealth: Payer: Self-pay

## 2019-02-26 ENCOUNTER — Ambulatory Visit (INDEPENDENT_AMBULATORY_CARE_PROVIDER_SITE_OTHER): Payer: Medicare Other | Admitting: Nurse Practitioner

## 2019-02-26 ENCOUNTER — Ambulatory Visit (INDEPENDENT_AMBULATORY_CARE_PROVIDER_SITE_OTHER): Payer: Medicare Other

## 2019-02-26 ENCOUNTER — Encounter (INDEPENDENT_AMBULATORY_CARE_PROVIDER_SITE_OTHER): Payer: Self-pay | Admitting: Nurse Practitioner

## 2019-02-26 ENCOUNTER — Other Ambulatory Visit: Payer: Self-pay

## 2019-02-26 VITALS — BP 168/68 | HR 58 | Resp 14 | Ht 65.0 in | Wt 139.0 lb

## 2019-02-26 DIAGNOSIS — I700 Atherosclerosis of aorta: Secondary | ICD-10-CM | POA: Diagnosis not present

## 2019-02-26 DIAGNOSIS — K551 Chronic vascular disorders of intestine: Secondary | ICD-10-CM

## 2019-02-26 DIAGNOSIS — Z23 Encounter for immunization: Secondary | ICD-10-CM | POA: Diagnosis not present

## 2019-02-26 DIAGNOSIS — I10 Essential (primary) hypertension: Secondary | ICD-10-CM | POA: Diagnosis not present

## 2019-02-26 NOTE — Telephone Encounter (Signed)
Copied from [place] [id number]. Topic: General - Other >> Feb 26, 2019  3:01 PM Wynetta Emery, [place] C wrote: Reason for CRM: pt called in to be advised. Pt would like to know if PCP would like to have an apt is needed? Pt was that she seen Dr. Karena Addison [facility], she was told that he cant fix pt's concern and to check with PCP to see if there is something more that PCP could suggest   Pt would like a call back to discuss this further.

## 2019-02-26 NOTE — Telephone Encounter (Signed)
Copied from [place] [id number]. Topic: General - Other >> Feb 26, 2019  4:15 PM Pauline Good wrote: Reason for CRM: want to know if the pt is due for [facility] and pneumonia shot

## 2019-02-27 NOTE — Telephone Encounter (Signed)
Advised patient that she does not need a pneumonia shot but can get the shingrix.

## 2019-02-27 NOTE — Telephone Encounter (Signed)
Patient stated that Dr Lucky Cowboy had mentioned her doing a visit with you to discuss some ongoing balance issues. Stated that she may need to have some x-rays done of her back to ensure that is not a contributing factor. Patient stated that the issues are not new. They have been going on for awhile. She is not having any pain in her back. Advised patient that I would discuss with you and then call her back to set up an appointment to discuss.

## 2019-02-27 NOTE — Telephone Encounter (Signed)
Pharmacy called in to request script for the Shingrix shot Fax number [phone] pharmacy number [phone]

## 2019-02-28 MED ORDER — ZOSTER VAC RECOMB ADJUVANTED 50 MCG/0.5ML IM SUSR: 0.5000 mL | Freq: Once | INTRAMUSCULAR | 0 refills | Status: AC

## 2019-02-28 NOTE — Telephone Encounter (Signed)
I went ahead and sent over the script for shingrix to her pharmacy. You don't have to do anything with this message, just wanted you to be aware.

## 2019-02-28 NOTE — Telephone Encounter (Signed)
Called Almyra Free back from [facility] and gave dates and type of pna vaccinations that are listed in pt's chart.

## 2019-02-28 NOTE — Telephone Encounter (Signed)
Tina [name] is callling from [facility] to follow up on the date of the patients pneumia vaccine. And which vaccine was it? Please advise.  Tina [name] heard back about shrigrix vaccine. But not the pneumia.  Please advise Cb- [phone]  Fax- [phone]

## 2019-03-04 ENCOUNTER — Encounter (INDEPENDENT_AMBULATORY_CARE_PROVIDER_SITE_OTHER): Payer: Self-pay | Admitting: Nurse Practitioner

## 2019-03-04 NOTE — Progress Notes (Signed)
SUBJECTIVE:  [name], female    DOB: 13-Jun-1934, 84 y.o.   MRN: [id number] Chief Complaint  Patient presents with  . Follow-up    ultrasound patient unsteady    HPI  Tina [name] is a 84 y.o. female Presents today for follow-up noninvasive studies to determine if the patient has possible infrainguinal vascular disease that may be amenable to treatment.  Previous angiogram showed that the patient has fairly severe calcific stenosis of the aorta around the visceral vessels that would require open surgical treatment.  Due to her age and other comorbidities an open surgical procedure would carry a very high morbidity mortality.  Previous study showed that her distal aorta and iliac arteries did not have significant stenosis.  Today the patient still endorses weakness with walking.  She denies any claudication-like symptoms.  She denies any fever, chills, nausea, vomiting or diarrhea.  She denies any postprandial pain.  Today the patient underwent bilateral ABIs which reveals an ABI of 0.77 on the right and 0.71 on the left.  The left lower extremity reveals triphasic tibial artery waveforms with biphasic waveforms throughout the lower extremity.  The right lower extremity has biphasic waveforms all the way throughout.  The patient has strong toe waveforms bilaterally.  Past Medical History:  Diagnosis Date  . Anemia   . Asthma   . Bladder cancer ([place])   . BRCA positive   . CAD (coronary artery disease)   . CHF (congestive heart failure) ([place])   . COPD (chronic obstructive pulmonary disease) ([place])   . Emphysema lung ([place])   . GERD (gastroesophageal reflux disease)   . History of colon polyps   . Hypercholesterolemia   . Hyperglycemia   . Hypertension   . Lung nodules   . Osteoporosis   . Personal history of tobacco use, presenting hazards to health 11/25/2014  . Polycythemia vera(238.4)   . Renal cyst   . Skin cancer     Past Surgical History:  Procedure  Laterality Date  . ANGIOPLASTY     coronary (x1)  . COLONOSCOPY WITH PROPOFOL N/A 03/02/2015   Procedure: COLONOSCOPY WITH PROPOFOL;  Surgeon: Lollie Sails, MD;  Location: [facility] ENDOSCOPY;  Service: Endoscopy;  Laterality: N/A;  . COLONOSCOPY WITH PROPOFOL N/A 02/13/2017   Procedure: COLONOSCOPY WITH PROPOFOL;  Surgeon: [name], MD;  Location: [facility] ENDOSCOPY;  Service: Endoscopy;  Laterality: N/A;  . CORONARY ANGIOPLASTY    . CORONARY ARTERY BYPASS GRAFT  09/17/2017   pt denies  . CYSTOSCOPY W/ RETROGRADES Bilateral 02/13/2017   Procedure: CYSTOSCOPY WITH RETROGRADE PYELOGRAM;  Surgeon: Hollice Espy, MD;  Location: [facility] ORS;  Service: Urology;  Laterality: Bilateral;  . CYSTOSCOPY W/ RETROGRADES Bilateral 09/19/2017   Procedure: CYSTOSCOPY WITH RETROGRADE PYELOGRAM;  Surgeon: Hollice Espy, MD;  Location: [facility] ORS;  Service: Urology;  Laterality: Bilateral;  . CYSTOSCOPY WITH BIOPSY  03/26/2017   Procedure: CYSTOSCOPY WITH BIOPSY;  Surgeon: Hollice Espy, MD;  Location: [facility] ORS;  Service: Urology;;  . Consuela Mimes WITH STENT PLACEMENT Bilateral 02/13/2017   Procedure: CYSTOSCOPY WITH STENT PLACEMENT;  Surgeon: Hollice Espy, MD;  Location: [facility] ORS;  Service: Urology;  Laterality: Bilateral;  . CYSTOSCOPY WITH STENT PLACEMENT Bilateral 03/26/2017   Procedure: CYSTOSCOPY WITH STENT EXCHANGE;  Surgeon: Hollice Espy, MD;  Location: [facility] ORS;  Service: Urology;  Laterality: Bilateral;  . ESOPHAGOGASTRODUODENOSCOPY (EGD) WITH PROPOFOL N/A 02/13/2017   Procedure: ESOPHAGOGASTRODUODENOSCOPY (EGD) WITH PROPOFOL;  Surgeon: [name], MD;  Location: [facility] ENDOSCOPY;  Service: Endoscopy;  Laterality: N/A;  . PORTA CATH INSERTION N/A 02/28/2017   Procedure: PORTA CATH INSERTION;  Surgeon: Algernon Huxley, MD;  Location: [place] CV LAB;  Service: Cardiovascular;  Laterality: N/A;  . PORTA CATH REMOVAL N/A 12/24/2017   Procedure: PORTA CATH REMOVAL;  Surgeon: Algernon Huxley, MD;  Location:  [place] CV LAB;  Service: Cardiovascular;  Laterality: N/A;  . SALPINGOOPHORECTOMY    . TONSILECTOMY/ADENOIDECTOMY WITH MYRINGOTOMY    . TONSILLECTOMY    . TRANSURETHRAL RESECTION OF BLADDER TUMOR N/A 02/13/2017   Procedure: TRANSURETHRAL RESECTION OF BLADDER TUMOR (TURBT);  Surgeon: Hollice Espy, MD;  Location: [facility] ORS;  Service: Urology;  Laterality: N/A;  . TUBAL LIGATION    . UPPER GI ENDOSCOPY  02/13/2017  . URETEROSCOPY Left 09/19/2017   Procedure: URETEROSCOPY;  Surgeon: Hollice Espy, MD;  Location: [facility] ORS;  Service: Urology;  Laterality: Left;  Marland Kitchen VISCERAL ANGIOGRAPHY N/A 01/23/2019   Procedure: VISCERAL ANGIOGRAPHY;  Surgeon: Algernon Huxley, MD;  Location: [place] CV LAB;  Service: Cardiovascular;  Laterality: N/A;  . VISCERAL ARTERY INTERVENTION N/A 02/12/2017   Procedure: VISCERAL ARTERY INTERVENTION;  Surgeon: Algernon Huxley, MD;  Location: [place] CV LAB;  Service: Cardiovascular;  Laterality: N/A;    Social History   Socioeconomic History  . Marital status: Widowed    Spouse name: Not on file  . Number of children: 2  . Years of education: Not on file  . Highest education level: Not on file  Occupational History  . Not on file  Social Needs  . Financial resource strain: Not hard at all  . Food insecurity    Worry: Never true    Inability: Never true  . Transportation needs    Medical: No    Non-medical: No  Tobacco Use  . Smoking status: Former Smoker    Packs/day: 1.00    Years: 45.00    Pack years: 45.00    Types: Cigarettes    Quit date: 11/11/2003    Years since quitting: 15.3  . Smokeless tobacco: Never Used  Substance and Sexual Activity  . Alcohol use: Not Currently    Alcohol/week: 0.0 standard drinks  . Drug use: No  . Sexual activity: Never  Lifestyle  . Physical activity    Days per week: Not on file    Minutes per session: Not on file  . Stress: Not at all  Relationships  . Social Herbalist on phone: Not on  file    Gets together: Not on file    Attends religious service: Not on file    Active member of club or organization: Not on file    Attends meetings of clubs or organizations: Not on file    Relationship status: Not on file  . Intimate partner violence    Fear of current or ex partner: No    Emotionally abused: No    Physically abused: No    Forced sexual activity: No  Other Topics Concern  . Not on file  Social History Narrative  . Not on file    Family History  Problem Relation Age of Onset  . Cirrhosis Father        died age 24  . Alcohol abuse Father   . Asthma Mother   . Congestive Heart Failure Mother   . Breast cancer Mother        2 (1/2 sisters)  . Osteoarthritis Mother   . Colon cancer  Mother   . Lupus Sister   . Alcohol abuse Sister   . Ovarian cancer Sister   . Osteoporosis Sister   . Skin cancer Sister   . Breast cancer Cousin   . Breast cancer Maternal Aunt     Allergies  Allergen Reactions  . Evista [Raloxifene] Other (See Comments)    Night sweats   . Fluticasone-Salmeterol Other (See Comments)    Cough, "chokes me"      Review of Systems   Review of Systems: Negative Unless Checked Constitutional: _0 Weight loss  _1 Fever  _2 Chills Cardiac: _3 Chest pain   _4  Atrial Fibrillation  _5 Palpitations   _6 Shortness of breath when laying flat   _7 Shortness of breath with exertion. _8 Shortness of breath at rest Vascular:  _9 Pain in legs with walking   _10 Pain in legs with standing _11 Pain in legs when laying flat   _12 Claudication    _13 Pain in feet when laying flat    _14 History of DVT   _15 Phlebitis   _16 Swelling in legs   _17 Varicose veins   _18 Non-healing ulcers Pulmonary:   _19 Uses home oxygen   _20 Productive cough   _21 Hemoptysis   _22 Wheeze  _23 COPD   _24 Asthma Neurologic:  _25 Dizziness   _26 Seizures  _27 Blackouts _28 History of stroke   _29 History of TIA  _30 Aphasia   _31 Temporary Blindness   _32 Weakness or numbness in arm   _33 Weakness or numbness in leg  Musculoskeletal:   _34 Joint swelling   _35 Joint pain   _36 Low back pain  _37  History of Knee Replacement _38 Arthritis _39 back Surgeries  _40  Spinal Stenosis    Hematologic:  _41 Easy bruising  _42 Easy bleeding   _43 Hypercoagulable state   _44 Anemic Gastrointestinal:  _45 Diarrhea   _46 Vomiting  _47 Gastroesophageal reflux/heartburn   _48 Difficulty swallowing. _49 Abdominal pain Genitourinary:  _50 Chronic kidney disease   _51 Difficult urination  _52 Anuric   _53 Blood in urine _54 Frequent urination  _55 Burning with urination   _56 Hematuria Skin:  _57 Rashes   _58 Ulcers _59 Wounds Psychological:  _60 History of anxiety   _61  History of major depression  _62  Memory Difficulties      OBJECTIVE:   Physical Exam  BP (!) 168/68 (BP Location: Right Arm)   Pulse (!) 58   Resp 14   Ht _63  (1.651 m)   Wt 139 lb (63 kg)   BMI 23.13 kg/m   Gen: WD/WN, NAD Head: [place]/AT, No temporalis wasting.  Ear/Nose/Throat: Hearing grossly intact, nares w/o erythema or drainage Eyes: PER, EOMI, sclera nonicteric.  Neck: Supple, no masses.  No JVD.  Pulmonary:  Good air movement, no use of accessory muscles.  Cardiac: RRR Vascular:  Vessel Right Left  Radial Palpable Palpable  Dorsalis Pedis Palpable Palpable  Posterior Tibial Palpable Palpable   Gastrointestinal: soft, non-distended. No guarding/no peritoneal signs.  Musculoskeletal: M/S 5/5 throughout.  No deformity or atrophy.  Neurologic: Pain and light touch intact in extremities.  Symmetrical.  Speech is fluent. Motor exam as listed above. Psychiatric: Judgment intact, Mood & affect appropriate for pt's clinical situation. Dermatologic: No Venous rashes. No Ulcers Noted.  No changes consistent with cellulitis. Lymph : No Cervical lymphadenopathy, no lichenification or skin changes of chronic lymphedema.       ASSESSMENT AND PLAN:  1. Aortic atherosclerosis ([place]) Discussed again with the patient that open surgical intervention would come with a high morbidity mortality.  Based on  the patient's noninvasive studies it is unlikely that angiogram to either lower extremity would help reduce her symptoms or weakness.  Patient will continue to follow-up for her mesenteric stenosis.  We will also continue to follow her aortic atherosclerosis.  2. Mesenteric ischemia due to arterial insufficiency (HCC) Previous noninvasive study showed that the previously placed stent was patent.  Patient will follow-up in 3 months with mesenteric duplex to follow-up.  Otherwise the patient seems to be doing well post procedure.  3. Essential hypertension Continue antihypertensive medications as already ordered, these medications have been reviewed and there are no changes at this time.    Current Outpatient Medications on File Prior to Visit  Medication Sig Dispense Refill  . albuterol (PROVENTIL HFA;VENTOLIN HFA) 108 (90 Base) MCG/ACT inhaler Inhale 2 puffs into the lungs every 6 (six) hours as needed for wheezing or shortness of breath. 1 Inhaler 2  . amLODipine (NORVASC) 5 MG tablet TAKE 1 TABLET BY MOUTH TWO  TIMES DAILY 180 tablet 1  . aspirin EC 81 MG tablet Take 1 tablet (81 mg total) by mouth daily. 150 tablet 2  . cholecalciferol (VITAMIN D) 1000 units tablet Take 1,000 Units by mouth daily.    . clopidogrel (PLAVIX) 75 MG tablet Take 1 tablet (75 mg total) by mouth daily. 30 tablet 0  . fluticasone (FLONASE) 50 MCG/ACT nasal spray USE 2 SPRAYS INTO BOTH  NOSTRILS DAILY AS NEEDED  FOR ALLERGIES. 48 g 1  . hydrALAZINE (APRESOLINE) 25 MG tablet Take 1 tablet (25 mg total) by mouth 3 (three) times daily. 90 tablet 2  . ipratropium-albuterol (DUONEB) 0.5-2.5 (3) MG/3ML SOLN Take 3 mLs by nebulization every 6 (six) hours as needed (for shortness of breath). 36 mL 0  . losartan (COZAAR) 100 MG tablet TAKE 1 TABLET BY MOUTH ONCE A DAY 90 tablet 1  . lovastatin (MEVACOR) 40 MG tablet TAKE 1 TABLET BY MOUTH  DAILY IN THE EVENING 90 tablet 1  . metoprolol tartrate (LOPRESSOR) 25 MG tablet TAKE 1  TABLET BY MOUTH  TWICE A DAY 180 tablet 1  . OVER THE COUNTER MEDICATION daily.    . potassium chloride (K-DUR) 10 MEQ tablet TAKE 1 TABLET BY MOUTH  DAILY 90 tablet 1  . rosuvastatin (CRESTOR) 20 MG tablet Take by mouth.    . SYMBICORT 160-4.5 MCG/ACT inhaler USE 2 PUFFS TWO TIMES DAILY 30.6 g 3  . torsemide (DEMADEX) 20 MG tablet Take 20 mg by mouth every morning.  11   Current Facility-Administered Medications on File Prior to Visit  Medication Dose Route Frequency Provider Last Rate Last Dose  . heparin lock flush 100 unit/mL  500 Units Intravenous Once Sindy Guadeloupe, MD      . sodium chloride flush (NS) 0.9 % injection 10 mL  10 mL Intravenous PRN Sindy Guadeloupe, MD        There are no Patient Instructions on file for this visit. No follow-ups on file.   Kris Hartmann, NP  This note was completed with Sales executive.  Any errors are purely unintentional.

## 2019-03-05 NOTE — Telephone Encounter (Signed)
Patient scheduled for visit with Dr Nicki Reaper and records requested from Dr Lucky Cowboy for appt

## 2019-03-10 ENCOUNTER — Ambulatory Visit
Admission: RE | Admit: 2019-03-10 | Discharge: 2019-03-10 | Disposition: A | Discharge status: Home / Self Care | Payer: Medicare Other | Source: Ambulatory Visit | Attending: Internal Medicine | Admitting: Internal Medicine

## 2019-03-10 DIAGNOSIS — Z1231 Encounter for screening mammogram for malignant neoplasm of breast: Secondary | ICD-10-CM | POA: Insufficient documentation

## 2019-03-13 ENCOUNTER — Ambulatory Visit (INDEPENDENT_AMBULATORY_CARE_PROVIDER_SITE_OTHER): Payer: Medicare Other | Admitting: Internal Medicine

## 2019-03-13 ENCOUNTER — Other Ambulatory Visit: Payer: Self-pay

## 2019-03-13 DIAGNOSIS — N183 Chronic kidney disease, stage 3 unspecified: Secondary | ICD-10-CM | POA: Diagnosis not present

## 2019-03-13 DIAGNOSIS — E7800 Pure hypercholesterolemia, unspecified: Secondary | ICD-10-CM | POA: Diagnosis not present

## 2019-03-13 DIAGNOSIS — K551 Chronic vascular disorders of intestine: Secondary | ICD-10-CM

## 2019-03-13 DIAGNOSIS — I700 Atherosclerosis of aorta: Secondary | ICD-10-CM

## 2019-03-13 DIAGNOSIS — R739 Hyperglycemia, unspecified: Secondary | ICD-10-CM | POA: Diagnosis not present

## 2019-03-13 DIAGNOSIS — J410 Simple chronic bronchitis: Secondary | ICD-10-CM

## 2019-03-13 DIAGNOSIS — I2510 Atherosclerotic heart disease of native coronary artery without angina pectoris: Secondary | ICD-10-CM | POA: Diagnosis not present

## 2019-03-13 DIAGNOSIS — I10 Essential (primary) hypertension: Secondary | ICD-10-CM | POA: Diagnosis not present

## 2019-03-13 DIAGNOSIS — M5440 Lumbago with sciatica, unspecified side: Secondary | ICD-10-CM

## 2019-03-13 DIAGNOSIS — M79606 Pain in leg, unspecified: Secondary | ICD-10-CM | POA: Diagnosis not present

## 2019-03-13 DIAGNOSIS — D45 Polycythemia vera: Secondary | ICD-10-CM

## 2019-03-13 NOTE — Progress Notes (Signed)
[name], female   DOB: August 22, 1934, 84 y.o.   MRN: [id number] .   Virtual Visit via telephone Note  This visit type was conducted due to national recommendations for restrictions regarding the COVID-19 pandemic (e.g. social distancing).  This format is felt to be most appropriate for this patient at this time.  All issues noted in this document were discussed and addressed.  No physical exam was performed (except for noted visual exam findings with Video Visits).   I connected with Casimer Leek by telephone and verified that I am speaking with the correct person using two identifiers. Location patient: home Location provider: work  Persons participating in the telephone visit: patient, provider  I discussed the limitations, risks, security and privacy concerns of performing an evaluation and management service by telephone and the availability of in person appointments.  The patient expressed understanding and agreed to proceed.   Reason for visit: work in appt.   HPI: Recently had f/u with [facility].  F/u aortic atherosclerosis.  They discussed that based on the patient's noninvasive studies it was felt unlikely that angiogram to lower extremities would help reduce her symptoms or weakness.  Will continue to follow up with vascular surgery regarding mesenteric stenosis s/p stent placement.  States when she is standing - notices her back hurts.  Notices pain in her legs.  At times, she feels her legs are weak.  More unsteady.  No chest pain.  Breathing stable.  Eating. No nausea or vomiting.     ROS: See pertinent positives and negatives per HPI.  Past Medical History:  Diagnosis Date   Anemia    Asthma    Bladder cancer ([employer] [facility] [place])    BRCA positive    CAD (coronary artery disease)    CHF (congestive heart failure) (HCC)    COPD (chronic obstructive pulmonary disease) (HCC)    Emphysema lung (HCC)    GERD (gastroesophageal reflux disease)    History of colon polyps      Hypercholesterolemia    Hyperglycemia    Hypertension    Lung nodules    Osteoporosis    Personal history of tobacco use, presenting hazards to health 11/25/2014   Polycythemia vera(238.4)    Renal cyst    Skin cancer     Past Surgical History:  Procedure Laterality Date   ANGIOPLASTY     coronary (x1)   COLONOSCOPY WITH PROPOFOL N/A 03/02/2015   Procedure: COLONOSCOPY WITH PROPOFOL;  Surgeon: Lollie Sails, MD;  Location[facility] ENDOSCOPY;  Service: Endoscopy;  Laterality: N/A;   COLONOSCOPY WITH PROPOFOL N/A 02/13/2017   Procedure: COLONOSCOPY WITH PROPOFOL;  Surgeon: [name], MD;  Location: [facility] ENDOSCOPY;  Service: Endoscopy;  Laterality: N/A;   CORONARY ANGIOPLASTY     CORONARY ARTERY BYPASS GRAFT  09/17/2017   pt denies   CYSTOSCOPY W/ RETROGRADES Bilateral 02/13/2017   Procedure: CYSTOSCOPY WITH RETROGRADE PYELOGRAM;  Surgeon: Hollice Espy, MD;  Location: [facility] ORS;  Service: Urology;  Laterality: Bilateral;   CYSTOSCOPY W/ RETROGRADES Bilateral 09/19/2017   Procedure: CYSTOSCOPY WITH RETROGRADE PYELOGRAM;  Surgeon: Hollice Espy, MD;  Location: [facility] ORS;  Service: Urology;  Laterality: Bilateral;   CYSTOSCOPY WITH BIOPSY  03/26/2017   Procedure: CYSTOSCOPY WITH BIOPSY;  Surgeon: Hollice Espy, MD;  Location: [facility] ORS;  Service: Urology;;   CYSTOSCOPY WITH STENT PLACEMENT Bilateral 02/13/2017   Procedure: CYSTOSCOPY WITH STENT PLACEMENT;  Surgeon: Hollice Espy, MD;  Location: [facility] ORS;  Service: Urology;  Laterality: Bilateral;  CYSTOSCOPY WITH STENT PLACEMENT Bilateral 03/26/2017   Procedure: CYSTOSCOPY WITH STENT EXCHANGE;  Surgeon: Hollice Espy, MD;  Location: [facility] ORS;  Service: Urology;  Laterality: Bilateral;   ESOPHAGOGASTRODUODENOSCOPY (EGD) WITH PROPOFOL N/A 02/13/2017   Procedure: ESOPHAGOGASTRODUODENOSCOPY (EGD) WITH PROPOFOL;  Surgeon: [name], MD;  Location: [facility] ENDOSCOPY;  Service: Endoscopy;  Laterality: N/A;   PORTA  CATH INSERTION N/A 02/28/2017   Procedure: PORTA CATH INSERTION;  Surgeon: Algernon Huxley, MD;  Location: [place] CV LAB;  Service: Cardiovascular;  Laterality: N/A;   PORTA CATH REMOVAL N/A 12/24/2017   Procedure: PORTA CATH REMOVAL;  Surgeon: Algernon Huxley, MD;  Location: [place] CV LAB;  Service: Cardiovascular;  Laterality: N/A;   SALPINGOOPHORECTOMY     TONSILECTOMY/ADENOIDECTOMY WITH MYRINGOTOMY     TONSILLECTOMY     TRANSURETHRAL RESECTION OF BLADDER TUMOR N/A 02/13/2017   Procedure: TRANSURETHRAL RESECTION OF BLADDER TUMOR (TURBT);  Surgeon: Hollice Espy, MD;  Location: [facility] ORS;  Service: Urology;  Laterality: N/A;   TUBAL LIGATION     UPPER GI ENDOSCOPY  02/13/2017   URETEROSCOPY Left 09/19/2017   Procedure: URETEROSCOPY;  Surgeon: Hollice Espy, MD;  Location: [facility] ORS;  Service: Urology;  Laterality: Left;   VISCERAL ANGIOGRAPHY N/A 01/23/2019   Procedure: VISCERAL ANGIOGRAPHY;  Surgeon: Algernon Huxley, MD;  Location: [place] CV LAB;  Service: Cardiovascular;  Laterality: N/A;   VISCERAL ARTERY INTERVENTION N/A 02/12/2017   Procedure: VISCERAL ARTERY INTERVENTION;  Surgeon: Algernon Huxley, MD;  Location: [place] CV LAB;  Service: Cardiovascular;  Laterality: N/A;    Family History  Problem Relation Age of Onset   Cirrhosis Father        died age 71   Alcohol abuse Father    Asthma Mother    Congestive Heart Failure Mother    Breast cancer Mother        2 (1/2 sisters)   Osteoarthritis Mother    Colon cancer Mother    Lupus Sister    Alcohol abuse Sister    Ovarian cancer Sister    Osteoporosis Sister    Skin cancer Sister    Breast cancer Cousin    Breast cancer Maternal Aunt     SOCIAL HX: reviewed.    Current Outpatient Medications:    albuterol (PROVENTIL HFA;VENTOLIN HFA) 108 (90 Base) MCG/ACT inhaler, Inhale 2 puffs into the lungs every 6 (six) hours as needed for wheezing or shortness of breath., Disp: 1 Inhaler,  Rfl: 2   amLODipine (NORVASC) 5 MG tablet, TAKE 1 TABLET BY MOUTH TWO  TIMES DAILY, Disp: 180 tablet, Rfl: 1   aspirin EC 81 MG tablet, Take 1 tablet (81 mg total) by mouth daily., Disp: 150 tablet, Rfl: 2   cholecalciferol (VITAMIN D) 1000 units tablet, Take 1,000 Units by mouth daily., Disp: , Rfl:    clopidogrel (PLAVIX) 75 MG tablet, Take 1 tablet (75 mg total) by mouth daily., Disp: 30 tablet, Rfl: 0   fluticasone (FLONASE) 50 MCG/ACT nasal spray, USE 2 SPRAYS INTO BOTH  NOSTRILS DAILY AS NEEDED  FOR ALLERGIES., Disp: 48 g, Rfl: 1   hydrALAZINE (APRESOLINE) 25 MG tablet, Take 1 tablet (25 mg total) by mouth 3 (three) times daily., Disp: 90 tablet, Rfl: 2   ipratropium-albuterol (DUONEB) 0.5-2.5 (3) MG/3ML SOLN, Take 3 mLs by nebulization every 6 (six) hours as needed (for shortness of breath)., Disp: 36 mL, Rfl: 0   losartan (COZAAR) 100 MG tablet, TAKE 1 TABLET BY MOUTH ONCE A DAY,  Disp: 90 tablet, Rfl: 1   metoprolol tartrate (LOPRESSOR) 25 MG tablet, TAKE 1 TABLET BY MOUTH  TWICE A DAY, Disp: 180 tablet, Rfl: 1   OVER THE COUNTER MEDICATION, daily., Disp: , Rfl:    potassium chloride (K-DUR) 10 MEQ tablet, TAKE 1 TABLET BY MOUTH  DAILY, Disp: 90 tablet, Rfl: 1   rosuvastatin (CRESTOR) 20 MG tablet, Take by mouth., Disp: , Rfl:    SYMBICORT 160-4.5 MCG/ACT inhaler, USE 2 PUFFS TWO TIMES DAILY, Disp: 30.6 g, Rfl: 3   torsemide (DEMADEX) 20 MG tablet, Take 20 mg by mouth every morning., Disp: , Rfl: 11 No current facility-administered medications for this visit.   Facility-Administered Medications Ordered in Other Visits:    heparin lock flush 100 unit/mL, 500 Units, Intravenous, Once, Sindy Guadeloupe, MD   sodium chloride flush (NS) 0.9 % injection 10 mL, 10 mL, Intravenous, PRN, Sindy Guadeloupe, MD  EXAM:  GENERAL: alert.  Sounds to be in no acute distress.  Answering questions appropriately.    PSYCH/NEURO: pleasant and cooperative, no obvious depression or anxiety,  speech and thought processing grossly intact  ASSESSMENT AND PLAN:  Discussed the following assessment and plan:  Aortic atherosclerosis (HCC) Found to have fairly severe calcific stenosis of the aorta around the visceral vessels.  Felt to be a poor candidate for open surgery.  On crestor.    CAD (coronary artery disease) Sees cardiology.  Continue risk factor modification.    CKD (chronic kidney disease), stage III ([place]) Followed by nephrology.  Follow metabolic panel.    COPD (chronic obstructive pulmonary disease) Followed by Dr Raul Del.  Breathing stable.    Essential hypertension Follow blood pressure.  Follow metabolic paenl.  Continue same medication.    Hypercholesterolemia On crestor.  Low cholesterol diet and exercise. Follow lipid panel and liver function tests.    Hyperglycemia Low carb diet and exercise.  Follow met b and a1c.    Mesenteric ischemia due to arterial insufficiency (HCC) Continue risk factor modification.    Polycythemia vera ([place]) Followed by hematology.    Low back pain Low back pain as outlined.  Persistent.  Check xray.  Will also check NCS.    Leg pain Leg pain and feeling weaker in her legs.  Check back xray.  Check NCS.      I discussed the assessment and treatment plan with the patient. The patient was provided an opportunity to ask questions and all were answered. The patient agreed with the plan and demonstrated an understanding of the instructions.   The patient was advised to call back or seek an in-person evaluation if the symptoms worsen or if the condition fails to improve as anticipated.  I provided 24 minutes of non-face-to-face time during this encounter.   Einar Pheasant, MD

## 2019-03-16 ENCOUNTER — Encounter: Payer: Self-pay | Admitting: Internal Medicine

## 2019-03-16 DIAGNOSIS — M545 Low back pain, unspecified: Secondary | ICD-10-CM | POA: Insufficient documentation

## 2019-03-16 DIAGNOSIS — M79606 Pain in leg, unspecified: Secondary | ICD-10-CM | POA: Insufficient documentation

## 2019-03-16 NOTE — Assessment & Plan Note (Signed)
Continue risk factor modification 

## 2019-03-16 NOTE — Assessment & Plan Note (Signed)
Low carb diet and exercise.  Follow met b and a1c.   

## 2019-03-16 NOTE — Assessment & Plan Note (Signed)
Sees cardiology.  Continue risk factor modification.

## 2019-03-16 NOTE — Assessment & Plan Note (Signed)
On crestor.  Low cholesterol diet and exercise.  Follow lipid panel and liver function tests.   

## 2019-03-16 NOTE — Assessment & Plan Note (Signed)
Follow blood pressure.  Follow metabolic paenl.  Continue same medication.

## 2019-03-16 NOTE — Assessment & Plan Note (Signed)
Leg pain and feeling weaker in her legs.  Check back xray.  Check NCS.

## 2019-03-16 NOTE — Assessment & Plan Note (Signed)
Low back pain as outlined.  Persistent.  Check xray.  Will also check NCS.

## 2019-03-16 NOTE — Assessment & Plan Note (Signed)
Found to have fairly severe calcific stenosis of the aorta around the visceral vessels.  Felt to be a poor candidate for open surgery.  On crestor.

## 2019-03-16 NOTE — Assessment & Plan Note (Signed)
Followed by hematology 

## 2019-03-16 NOTE — Assessment & Plan Note (Signed)
Followed by nephrology.  Follow metabolic panel.  

## 2019-03-16 NOTE — Assessment & Plan Note (Signed)
Followed by Dr Fleming.  Breathing stable.   

## 2019-03-17 ENCOUNTER — Other Ambulatory Visit: Payer: Self-pay | Admitting: Internal Medicine

## 2019-03-19 ENCOUNTER — Other Ambulatory Visit: Payer: Medicare Other

## 2019-03-21 ENCOUNTER — Other Ambulatory Visit: Payer: Self-pay

## 2019-03-21 ENCOUNTER — Ambulatory Visit (INDEPENDENT_AMBULATORY_CARE_PROVIDER_SITE_OTHER): Payer: Medicare Other

## 2019-03-21 ENCOUNTER — Other Ambulatory Visit (INDEPENDENT_AMBULATORY_CARE_PROVIDER_SITE_OTHER): Payer: Medicare Other

## 2019-03-21 DIAGNOSIS — E7800 Pure hypercholesterolemia, unspecified: Secondary | ICD-10-CM

## 2019-03-21 DIAGNOSIS — M79606 Pain in leg, unspecified: Secondary | ICD-10-CM

## 2019-03-21 DIAGNOSIS — N183 Chronic kidney disease, stage 3 unspecified: Secondary | ICD-10-CM

## 2019-03-21 DIAGNOSIS — M5440 Lumbago with sciatica, unspecified side: Secondary | ICD-10-CM

## 2019-03-21 DIAGNOSIS — R739 Hyperglycemia, unspecified: Secondary | ICD-10-CM | POA: Diagnosis not present

## 2019-03-21 DIAGNOSIS — M47816 Spondylosis without myelopathy or radiculopathy, lumbar region: Secondary | ICD-10-CM | POA: Diagnosis not present

## 2019-03-21 LAB — LIPID PANEL
Cholesterol: 161 mg/dL (ref 0–200)
HDL: 67.5 mg/dL (ref >39.00)
LDL Cholesterol: 76 mg/dL (ref 0–99)
NonHDL: 93.93
Total CHOL/HDL Ratio: 2
Triglycerides: 89 mg/dL (ref 0.0–149.0)
VLDL: 17.8 mg/dL (ref 0.0–40.0)

## 2019-03-21 LAB — HEPATIC FUNCTION PANEL
ALT: 14 U/L (ref 0–35)
AST: 16 U/L (ref 0–37)
Albumin: 4.3 g/dL (ref 3.5–5.2)
Alkaline Phosphatase: 54 U/L (ref 39–117)
Bilirubin, Direct: 0.2 mg/dL (ref 0.0–0.3)
Total Bilirubin: 0.9 mg/dL (ref 0.2–1.2)
Total Protein: 6.4 g/dL (ref 6.0–8.3)

## 2019-03-21 LAB — BASIC METABOLIC PANEL
BUN: 17 mg/dL (ref 6–23)
CO2: 26 mEq/L (ref 19–32)
Calcium: 9.4 mg/dL (ref 8.4–10.5)
Chloride: 104 mEq/L (ref 96–112)
Creatinine, Ser: 0.99 mg/dL (ref 0.40–1.20)
GFR: 53.66 mL/min — ABNORMAL LOW (ref >60.00)
Glucose, Bld: 135 mg/dL — ABNORMAL HIGH (ref 70–99)
Potassium: 4 mEq/L (ref 3.5–5.1)
Sodium: 140 mEq/L (ref 135–145)

## 2019-03-21 LAB — HEMOGLOBIN A1C: Hgb A1c MFr Bld: 5.5 % (ref 4.6–6.5)

## 2019-03-21 NOTE — Progress Notes (Signed)
dy

## 2019-03-24 ENCOUNTER — Other Ambulatory Visit: Payer: Self-pay | Admitting: Internal Medicine

## 2019-03-24 DIAGNOSIS — R29898 Other symptoms and signs involving the musculoskeletal system: Secondary | ICD-10-CM

## 2019-03-24 DIAGNOSIS — M5440 Lumbago with sciatica, unspecified side: Secondary | ICD-10-CM

## 2019-03-24 NOTE — Progress Notes (Signed)
Order placed for home health PT.

## 2019-03-25 IMAGING — CT CT CTA ABD/PEL W/CM AND/OR W/O CM
3 of 9 series · 10 of 46 positions shown, 16 images · IV contrast (isovue)
Comparison: Chest, abdomen and pelvis CT dated 01/14/2009.

CLINICAL DATA: Nausea and abdominal pain after eating. Clinical
concern for vascular insufficiency.

EXAM:
CTA ABDOMEN AND PELVIS WITHOUT AND WITH CONTRAST
TECHNIQUE: Multidetector CT imaging of the abdomen and pelvis was performed
using the standard protocol during bolus administration of
intravenous contrast. Multiplanar reconstructed images and MIPs were
obtained and reviewed to evaluate the vascular anatomy.
CONTRAST:  75 cc Isovue 370

[Series 4: axial arterial · axial · arterial · 0.79mm/px · z∈[-455,-411]mm · 2 of 208 slices shown]
[im 22/208  soft-tissue]
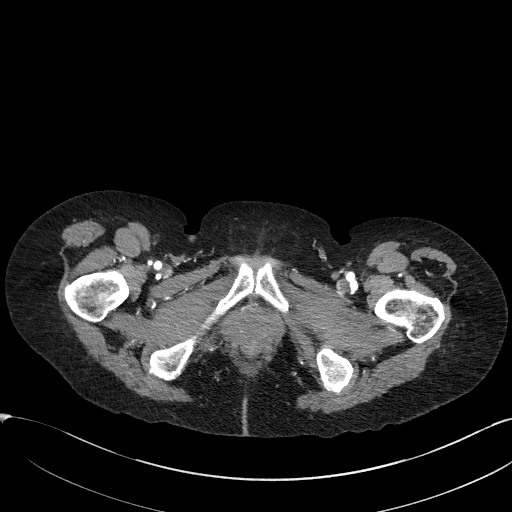
[im 44/208  soft-tissue]
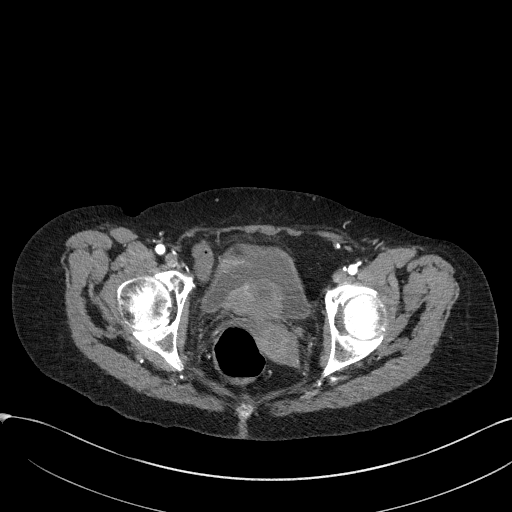

[Series 5: axial venous · axial · portal-venous · 0.79mm/px · z∈[-438,-143]mm · 6 of 83 slices shown, 11 images]
[im 12/83  soft-tissue]
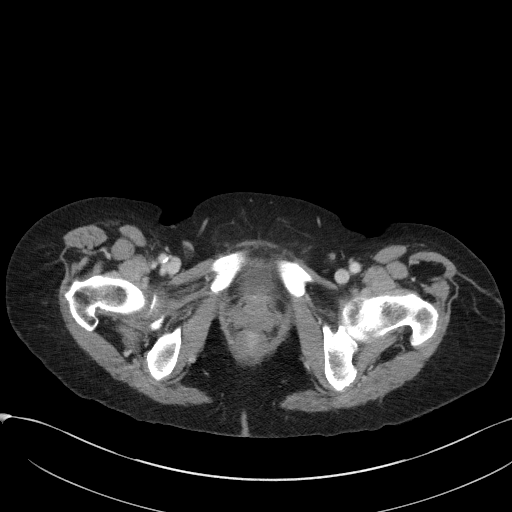
[im 12/83  bone]
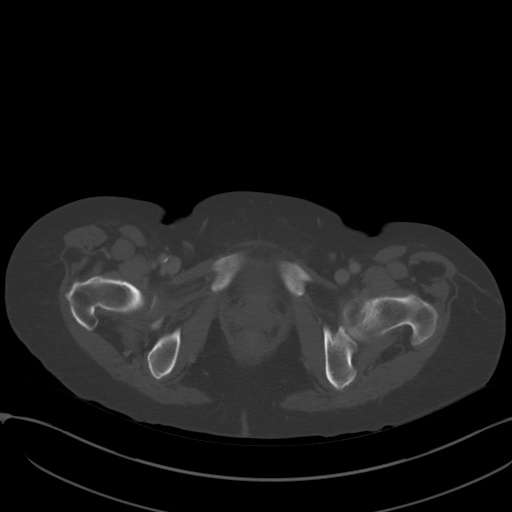
[im 24/83  soft-tissue]
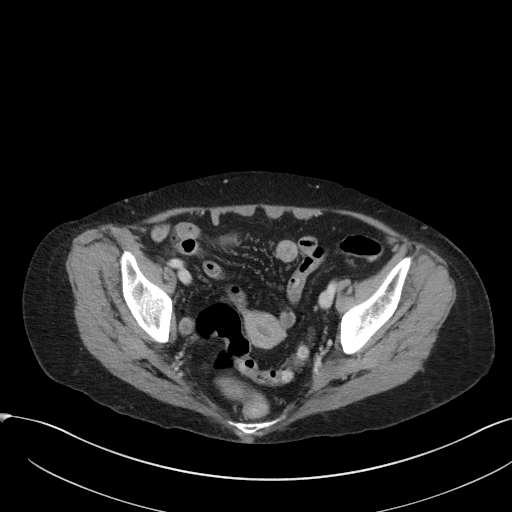
[im 36/83  soft-tissue]
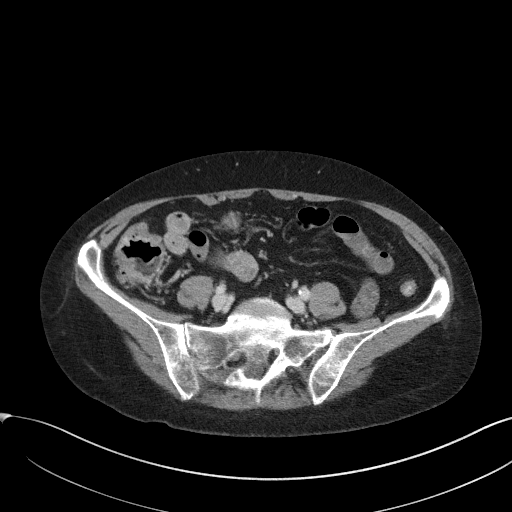
[im 36/83  lung]
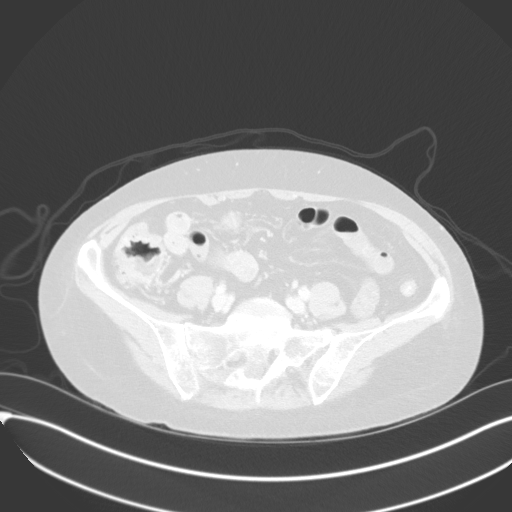
[im 47/83  soft-tissue]
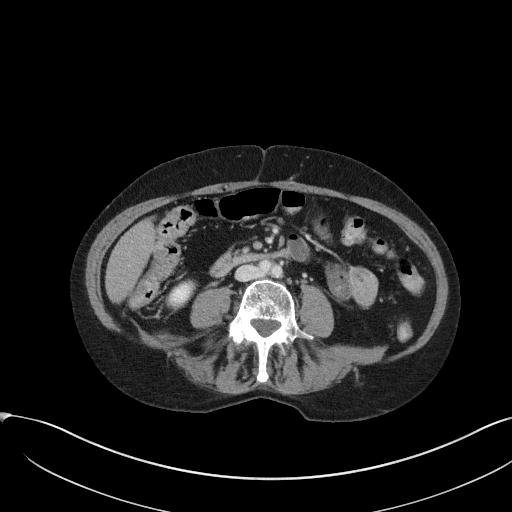
[im 47/83  lung]
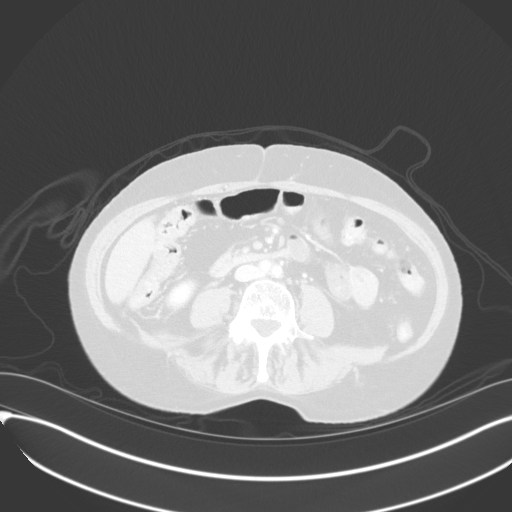
[im 59/83  soft-tissue]
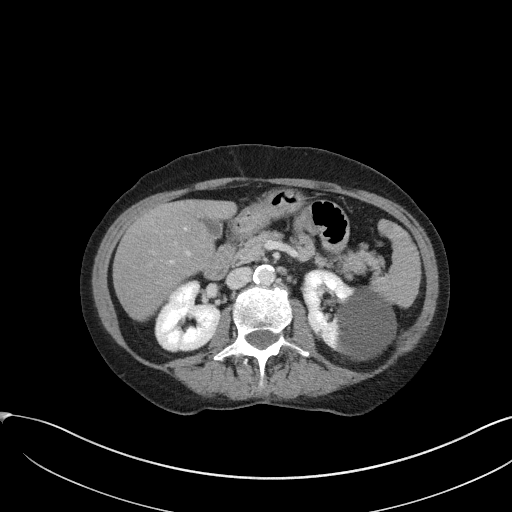
[im 59/83  lung]
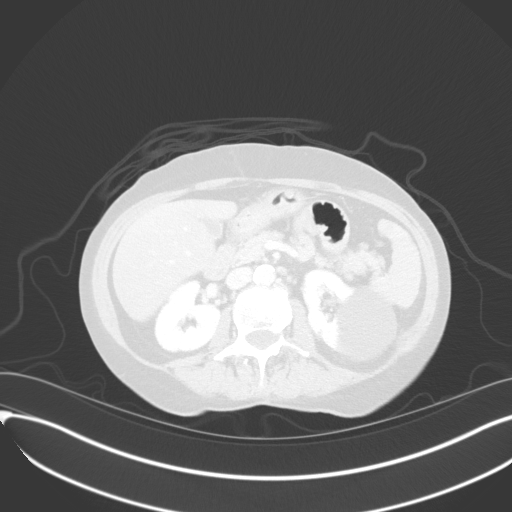
[im 71/83  soft-tissue]
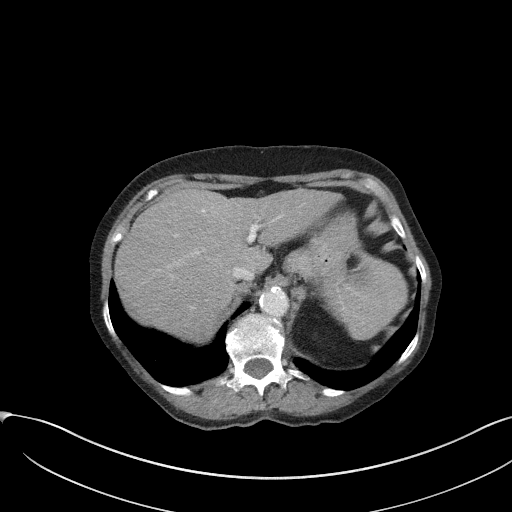
[im 71/83  lung]
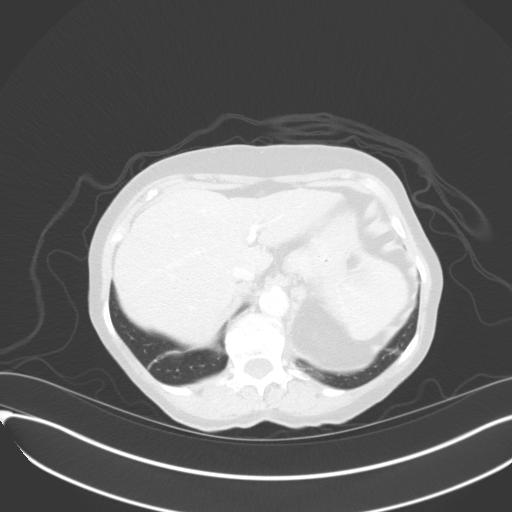

[Series 14: coronal venous mpr · coronal · portal-venous · 0.68mm/px · 2 of 114 slices shown, 3 images]
[im 38/114  soft-tissue]
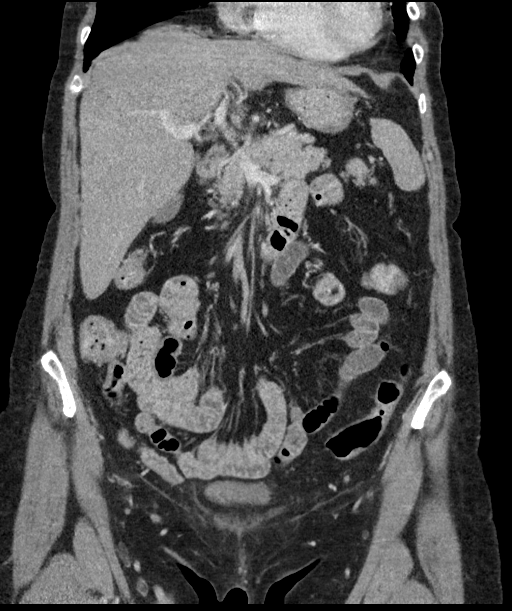
[im 38/114  bone]
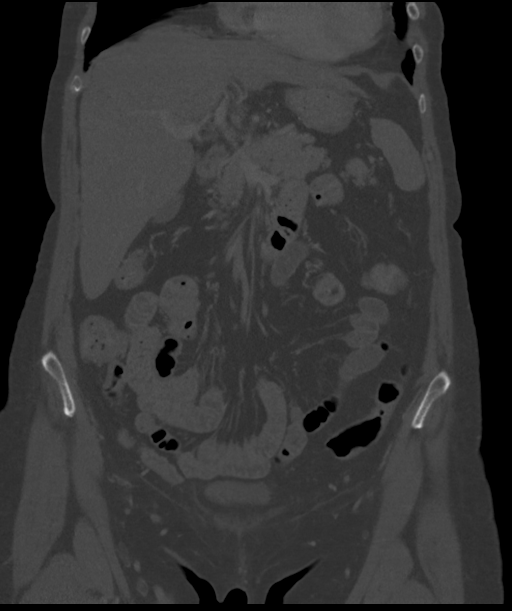
[im 76/114  soft-tissue]
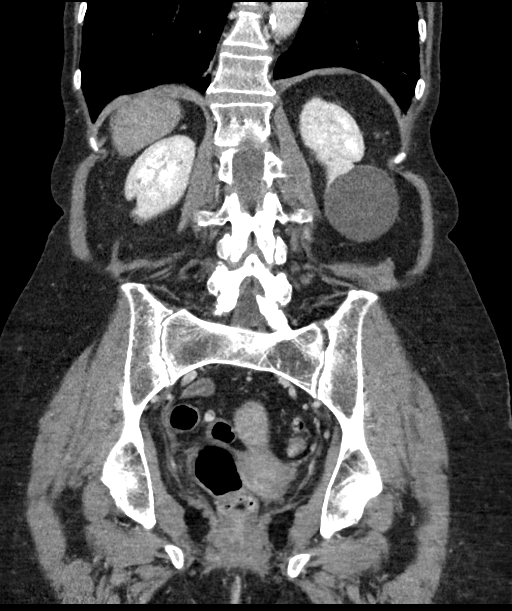

[10 of 46 positions shown; findings below may reference images not displayed]

FINDINGS: VASCULAR

Aorta: Marked calcified and noncalcified plaque formation in the
anterior aspect of the proximal abdominal aorta at the level of the
celiac axis and superior mesenteric artery origins. Additional
calcified plaque formation elsewhere in the aorta. No aortic
aneurysm or dissection seen.

Celiac: Marked calcified and noncalcified plaque formation at the
origin with complete occlusion of the first 8 mm of the celiac axis.
There is collateral reconstitution distal to the occlusion.

SMA: Marked calcified and noncalcified plaque formation at the
origin with complete or near complete occlusion of a 3 mm segment of
the proximal superior mesenteric artery.

Renals: Single right renal artery with plaque formation at the
origin producing approximately 50% stenosis. One anterior and 1
posterior left renal artery with plaque formation at the origin of
both. There is approximately 80% stenosis of the proximal portion of
the anterior artery at its origin. The posterior artery is
completely occluded over a 3 mm long segment proximally.

IMA: Calcified and noncalcified plaque formation at the origin with
complete or near complete occlusion.

Inflow: Mild bilateral iliac calcified plaque formation without
significant stenosis on either side.

Proximal Outflow: Normal appearing proximal common femoral, femoral
and profunda femoral arteries.

Veins: No venous thrombosis demonstrated.

Review of the MIP images confirms the above findings.

NON-VASCULAR

Lower chest: Linear atelectasis or scarring at both lung bases.

Hepatobiliary: No focal liver abnormality is seen. No gallstones,
gallbladder wall thickening, or biliary dilatation.

Pancreas: Unremarkable. No pancreatic ductal dilatation or
surrounding inflammatory changes.

Spleen: Normal in size without focal abnormality.

Adrenals/Urinary Tract: Bilateral renal cysts. Mildly irregular and
heterogeneous posterior bladder mass, measuring 4.7 x 3.0 cm on
image number 168 of series 4. This measures 3.2 cm in length on
sagittal image number 85 series seen. Unremarkable ureters. No
urinary tract calculi or hydronephrosis. Mild bilateral adrenal
hyperplasia.

Stomach/Bowel: Small hiatal hernia. Mild sigmoid colon
diverticulosis. Poorly distended ascending colon with some irregular
wall thickening. Unremarkable small bowel.

Lymphatic: No enlarged lymph nodes.

Reproductive: Uterus and bilateral adnexa are unremarkable.

Other: No abdominal wall hernia or abnormality. No abdominopelvic
ascites.

Musculoskeletal: Approximately 20% L2 superior endplate compression
deformity with Schmorl's node formation. No acute fracture lines or
bony retropulsion. Mild lumbar and lower thoracic spine degenerative
changes.
IMPRESSION: VASCULAR

1. Marked densely calcified and noncalcified plaque formation in the
anterior aspect of the proximal abdominal aorta at the origins of
the celiac axis and superior mesenteric artery. There is associated
complete occlusion of the first 8 mm of the celiac axis and complete
or near complete occlusion of a 3 mm segment of the proximal
superior mesenteric artery.
2. Plaque formation causing complete or near complete occlusion of
the inferior mesenteric artery at its origin.
3. Plaque formation causing complete occlusion of a 3 mm long
segment of the proximal portion of the posterior left renal artery
and plaque formation causing approximately 80% stenosis of the
proximal portion of the anterior left renal artery.
4. Plaque formation causing approximately 50% stenosis of the origin
of the right renal artery.
5. Catheter angiography and stent placement as indicated at that
time is recommended.

NON-VASCULAR

1. **An incidental finding of potential clinical significance has
been found. 4.7 x 3.2 x 3.0 cm posterior bladder mass. This is
compatible with a primary bladder malignancy.**
2. **An incidental finding of potential clinical significance has
been found. Irregular wall thickening involving the poorly distended
ascending colon segment, suspicious for the possibility of colon
cancer. Correlation with colonoscopy is recommended.**
3. Small hiatal hernia.
4. Mild sigmoid diverticulosis.

## 2019-03-27 ENCOUNTER — Telehealth: Payer: Self-pay | Admitting: Internal Medicine

## 2019-03-27 DIAGNOSIS — N189 Chronic kidney disease, unspecified: Secondary | ICD-10-CM | POA: Diagnosis not present

## 2019-03-27 DIAGNOSIS — Z8551 Personal history of malignant neoplasm of bladder: Secondary | ICD-10-CM | POA: Diagnosis not present

## 2019-03-27 DIAGNOSIS — J449 Chronic obstructive pulmonary disease, unspecified: Secondary | ICD-10-CM | POA: Diagnosis not present

## 2019-03-27 DIAGNOSIS — M810 Age-related osteoporosis without current pathological fracture: Secondary | ICD-10-CM | POA: Diagnosis not present

## 2019-03-27 DIAGNOSIS — Z85828 Personal history of other malignant neoplasm of skin: Secondary | ICD-10-CM | POA: Diagnosis not present

## 2019-03-27 DIAGNOSIS — Z87891 Personal history of nicotine dependence: Secondary | ICD-10-CM | POA: Diagnosis not present

## 2019-03-27 DIAGNOSIS — I2510 Atherosclerotic heart disease of native coronary artery without angina pectoris: Secondary | ICD-10-CM | POA: Diagnosis not present

## 2019-03-27 DIAGNOSIS — I509 Heart failure, unspecified: Secondary | ICD-10-CM | POA: Diagnosis not present

## 2019-03-27 DIAGNOSIS — I130 Hypertensive heart and chronic kidney disease with heart failure and stage 1 through stage 4 chronic kidney disease, or unspecified chronic kidney disease: Secondary | ICD-10-CM | POA: Diagnosis not present

## 2019-03-27 DIAGNOSIS — Z1501 Genetic susceptibility to malignant neoplasm of breast: Secondary | ICD-10-CM | POA: Diagnosis not present

## 2019-03-27 DIAGNOSIS — K551 Chronic vascular disorders of intestine: Secondary | ICD-10-CM | POA: Diagnosis not present

## 2019-03-27 DIAGNOSIS — I700 Atherosclerosis of aorta: Secondary | ICD-10-CM | POA: Diagnosis not present

## 2019-03-27 DIAGNOSIS — M5441 Lumbago with sciatica, right side: Secondary | ICD-10-CM | POA: Diagnosis not present

## 2019-03-27 DIAGNOSIS — D45 Polycythemia vera: Secondary | ICD-10-CM | POA: Diagnosis not present

## 2019-03-27 DIAGNOSIS — Z951 Presence of aortocoronary bypass graft: Secondary | ICD-10-CM | POA: Diagnosis not present

## 2019-03-27 DIAGNOSIS — M21371 Foot drop, right foot: Secondary | ICD-10-CM | POA: Diagnosis not present

## 2019-03-27 NOTE — Telephone Encounter (Signed)
Caller name: Suezanne Jacquet  Relation to pt: PT from [facility]  Call back number: [phone]    Reason for call:  Verbal orders for 2x 1 1x 1 2x 2 for PT

## 2019-03-27 NOTE — Telephone Encounter (Signed)
Called and left voice message for [facility] w/ [facility].  Gave verbal orders for PT as requested.

## 2019-03-31 ENCOUNTER — Other Ambulatory Visit: Payer: Self-pay

## 2019-03-31 ENCOUNTER — Other Ambulatory Visit: Payer: Self-pay | Admitting: Internal Medicine

## 2019-03-31 MED ORDER — HYDRALAZINE HCL 25 MG PO TABS: 25.0000 mg | ORAL_TABLET | Freq: Three times a day (TID) | ORAL | 1 refills | Status: DC

## 2019-03-31 NOTE — Telephone Encounter (Signed)
Copied from [place] [id number]. Topic: Quick Communication - Rx Refill/Question >> Mar 31, 2019  9:24 AM Rainey Pines A wrote: Medication: hydrALAZINE (APRESOLINE) 25 MG tablet (Patient said that pharmacy has called multiple times to get medication called in and now patient is out of medication.)  Has the patient contacted their pharmacy? Yes (Agent: If no, request that the patient contact the pharmacy for the refill.) (Agent: If yes, when and what did the pharmacy advise?)Contact PCP  Preferred Pharmacy (with phone number or street name): [place], [place] - [place] [phone] (Phone) [phone] (Fax)    Agent: Please be advised that RX refills may take up to 3 business days. We ask that you follow-up with your pharmacy.

## 2019-04-01 ENCOUNTER — Ambulatory Visit (INDEPENDENT_AMBULATORY_CARE_PROVIDER_SITE_OTHER): Payer: Medicare Other | Admitting: Internal Medicine

## 2019-04-01 ENCOUNTER — Encounter: Payer: Self-pay | Admitting: Internal Medicine

## 2019-04-01 DIAGNOSIS — D45 Polycythemia vera: Secondary | ICD-10-CM

## 2019-04-01 DIAGNOSIS — J410 Simple chronic bronchitis: Secondary | ICD-10-CM | POA: Diagnosis not present

## 2019-04-01 DIAGNOSIS — I2510 Atherosclerotic heart disease of native coronary artery without angina pectoris: Secondary | ICD-10-CM

## 2019-04-01 DIAGNOSIS — E7800 Pure hypercholesterolemia, unspecified: Secondary | ICD-10-CM | POA: Diagnosis not present

## 2019-04-01 DIAGNOSIS — I700 Atherosclerosis of aorta: Secondary | ICD-10-CM

## 2019-04-01 DIAGNOSIS — R2681 Unsteadiness on feet: Secondary | ICD-10-CM

## 2019-04-01 DIAGNOSIS — M21371 Foot drop, right foot: Secondary | ICD-10-CM | POA: Diagnosis not present

## 2019-04-01 DIAGNOSIS — I10 Essential (primary) hypertension: Secondary | ICD-10-CM | POA: Diagnosis not present

## 2019-04-01 DIAGNOSIS — N183 Chronic kidney disease, stage 3 unspecified: Secondary | ICD-10-CM

## 2019-04-01 MED ORDER — HYDRALAZINE HCL 25 MG PO TABS: 25.0000 mg | ORAL_TABLET | Freq: Three times a day (TID) | ORAL | 0 refills | Status: DC

## 2019-04-01 NOTE — Progress Notes (Signed)
[name], female   DOB: 10/08/1934, 84 y.o.   MRN: [id number]   Subjective:    [name], female    DOB: [birth year], 83 y.o.   MRN: [id number]  HPI  Patient here as a work in appt. for home health evaluation.  She has had a couple of falls recently.  She describes right foot "not strong enough".  When she walks, she feels off, trying to compensate for her right foot and leg.  This has been present since the spring (or before), but has become more noticeable to her over the last couple of months.  Has to think about walking.  No headache.  No chest pain.  Breathing stable.  No increased cough or congestion.  No acid reflux.  Eating.  No abdominal pain.  Bowels moving.     Past Medical History:  Diagnosis Date   Anemia    Asthma    Bladder cancer ([place])    BRCA positive    CAD (coronary artery disease)    CHF (congestive heart failure) (HCC)    COPD (chronic obstructive pulmonary disease) (HCC)    Emphysema lung (HCC)    GERD (gastroesophageal reflux disease)    History of colon polyps    Hypercholesterolemia    Hyperglycemia    Hypertension    Lung nodules    Osteoporosis    Personal history of tobacco use, presenting hazards to health 11/25/2014   Polycythemia vera(238.4)    Renal cyst    Skin cancer    Past Surgical History:  Procedure Laterality Date   ANGIOPLASTY     coronary (x1)   COLONOSCOPY WITH PROPOFOL N/A 03/02/2015   Procedure: COLONOSCOPY WITH PROPOFOL;  Surgeon: Lollie Sails, MD;  Location[facility] ENDOSCOPY;  Service: Endoscopy;  Laterality: N/A;   COLONOSCOPY WITH PROPOFOL N/A 02/13/2017   Procedure: COLONOSCOPY WITH PROPOFOL;  Surgeon: [name], MD;  Location: [facility] ENDOSCOPY;  Service: Endoscopy;  Laterality: N/A;   CORONARY ANGIOPLASTY     CORONARY ARTERY BYPASS GRAFT  09/17/2017   pt denies   CYSTOSCOPY W/ RETROGRADES Bilateral 02/13/2017   Procedure: CYSTOSCOPY WITH RETROGRADE PYELOGRAM;  Surgeon:  Hollice Espy, MD;  Location: [facility] ORS;  Service: Urology;  Laterality: Bilateral;   CYSTOSCOPY W/ RETROGRADES Bilateral 09/19/2017   Procedure: CYSTOSCOPY WITH RETROGRADE PYELOGRAM;  Surgeon: Hollice Espy, MD;  Location: [facility] ORS;  Service: Urology;  Laterality: Bilateral;   CYSTOSCOPY WITH BIOPSY  03/26/2017   Procedure: CYSTOSCOPY WITH BIOPSY;  Surgeon: Hollice Espy, MD;  Location: [facility] ORS;  Service: Urology;;   CYSTOSCOPY WITH STENT PLACEMENT Bilateral 02/13/2017   Procedure: CYSTOSCOPY WITH STENT PLACEMENT;  Surgeon: Hollice Espy, MD;  Location: [facility] ORS;  Service: Urology;  Laterality: Bilateral;   CYSTOSCOPY WITH STENT PLACEMENT Bilateral 03/26/2017   Procedure: CYSTOSCOPY WITH STENT EXCHANGE;  Surgeon: Hollice Espy, MD;  Location: [facility] ORS;  Service: Urology;  Laterality: Bilateral;   ESOPHAGOGASTRODUODENOSCOPY (EGD) WITH PROPOFOL N/A 02/13/2017   Procedure: ESOPHAGOGASTRODUODENOSCOPY (EGD) WITH PROPOFOL;  Surgeon: [name], MD;  Location: [facility] ENDOSCOPY;  Service: Endoscopy;  Laterality: N/A;   PORTA CATH INSERTION N/A 02/28/2017   Procedure: PORTA CATH INSERTION;  Surgeon: Algernon Huxley, MD;  Location: [place] CV LAB;  Service: Cardiovascular;  Laterality: N/A;   PORTA CATH REMOVAL N/A 12/24/2017   Procedure: PORTA CATH REMOVAL;  Surgeon: Algernon Huxley, MD;  Location: [place] CV LAB;  Service: Cardiovascular;  Laterality: N/A;   SALPINGOOPHORECTOMY  TONSILECTOMY/ADENOIDECTOMY WITH MYRINGOTOMY     TONSILLECTOMY     TRANSURETHRAL RESECTION OF BLADDER TUMOR N/A 02/13/2017   Procedure: TRANSURETHRAL RESECTION OF BLADDER TUMOR (TURBT);  Surgeon: Hollice Espy, MD;  Location: [facility] ORS;  Service: Urology;  Laterality: N/A;   TUBAL LIGATION     UPPER GI ENDOSCOPY  02/13/2017   URETEROSCOPY Left 09/19/2017   Procedure: URETEROSCOPY;  Surgeon: Hollice Espy, MD;  Location: [facility] ORS;  Service: Urology;  Laterality: Left;   VISCERAL ANGIOGRAPHY N/A  01/23/2019   Procedure: VISCERAL ANGIOGRAPHY;  Surgeon: Algernon Huxley, MD;  Location: [place] CV LAB;  Service: Cardiovascular;  Laterality: N/A;   VISCERAL ARTERY INTERVENTION N/A 02/12/2017   Procedure: VISCERAL ARTERY INTERVENTION;  Surgeon: Algernon Huxley, MD;  Location: [place] CV LAB;  Service: Cardiovascular;  Laterality: N/A;   Family History  Problem Relation Age of Onset   Cirrhosis Father        died age 7   Alcohol abuse Father    Asthma Mother    Congestive Heart Failure Mother    Breast cancer Mother        2 (1/2 sisters)   Osteoarthritis Mother    Colon cancer Mother    Lupus Sister    Alcohol abuse Sister    Ovarian cancer Sister    Osteoporosis Sister    Skin cancer Sister    Breast cancer Cousin    Breast cancer Maternal Aunt    Social History   Socioeconomic History   Marital status: Widowed    Spouse name: Not on file   Number of children: 2   Years of education: Not on file   Highest education level: Not on file  Occupational History   Not on file  Social Needs   Financial resource strain: Not hard at all   Food insecurity    Worry: Never true    Inability: Never true   Transportation needs    Medical: No    Non-medical: No  Tobacco Use   Smoking status: Former Smoker    Packs/day: 1.00    Years: 45.00    Pack years: 45.00    Types: Cigarettes    Quit date: 11/11/2003    Years since quitting: 15.4   Smokeless tobacco: Never Used  Substance and Sexual Activity   Alcohol use: Not Currently    Alcohol/week: 0.0 standard drinks   Drug use: No   Sexual activity: Never  Lifestyle   Physical activity    Days per week: Not on file    Minutes per session: Not on file   Stress: Not at all  Relationships   Social connections    Talks on phone: Not on file    Gets together: Not on file    Attends religious service: Not on file    Active member of club or organization: Not on file    Attends meetings of  clubs or organizations: Not on file    Relationship status: Not on file  Other Topics Concern   Not on file  Social History Narrative   Not on file    Outpatient Encounter Medications as of 04/01/2019  Medication Sig   albuterol (PROVENTIL HFA;VENTOLIN HFA) 108 (90 Base) MCG/ACT inhaler Inhale 2 puffs into the lungs every 6 (six) hours as needed for wheezing or shortness of breath.   amLODipine (NORVASC) 5 MG tablet TAKE 1 TABLET BY MOUTH  TWICE DAILY   aspirin EC 81 MG tablet Take 1 tablet (81  mg total) by mouth daily.   cholecalciferol (VITAMIN D) 1000 units tablet Take 1,000 Units by mouth daily.   clopidogrel (PLAVIX) 75 MG tablet Take 1 tablet (75 mg total) by mouth daily.   fluticasone (FLONASE) 50 MCG/ACT nasal spray USE 2 SPRAYS INTO BOTH  NOSTRILS DAILY AS NEEDED  FOR ALLERGIES.   hydrALAZINE (APRESOLINE) 25 MG tablet Take 1 tablet (25 mg total) by mouth 3 (three) times daily.   ipratropium-albuterol (DUONEB) 0.5-2.5 (3) MG/3ML SOLN Take 3 mLs by nebulization every 6 (six) hours as needed (for shortness of breath).   losartan (COZAAR) 100 MG tablet TAKE 1 TABLET BY MOUTH ONCE DAILY   metoprolol tartrate (LOPRESSOR) 25 MG tablet TAKE 1 TABLET BY MOUTH  TWICE DAILY   OVER THE COUNTER MEDICATION daily.   potassium chloride (K-DUR) 10 MEQ tablet TAKE 1 TABLET BY MOUTH  DAILY   rosuvastatin (CRESTOR) 20 MG tablet Take by mouth.   SYMBICORT 160-4.5 MCG/ACT inhaler USE 2 PUFFS TWO TIMES DAILY   torsemide (DEMADEX) 20 MG tablet Take 20 mg by mouth every morning.   [DISCONTINUED] hydrALAZINE (APRESOLINE) 25 MG tablet Take 1 tablet (25 mg total) by mouth 3 (three) times daily.   [DISCONTINUED] hydrALAZINE (APRESOLINE) 25 MG tablet Take 1 tablet (25 mg total) by mouth 3 (three) times daily.   Facility-Administered Encounter Medications as of 04/01/2019  Medication   heparin lock flush 100 unit/mL   sodium chloride flush (NS) 0.9 % injection 10 mL    Review of  Systems  Constitutional: Negative for appetite change and unexpected weight change.  HENT: Negative for congestion and sinus pressure.   Respiratory: Negative for cough and chest tightness.        Breathing stable.   Cardiovascular: Negative for chest pain, palpitations and leg swelling.  Gastrointestinal: Negative for abdominal pain, diarrhea, nausea and vomiting.  Genitourinary: Negative for difficulty urinating and dysuria.  Musculoskeletal: Positive for gait problem. Negative for joint swelling.       Right foot/leg change as outlined.    Skin: Negative for color change and rash.  Neurological: Negative for dizziness, light-headedness and headaches.  Psychiatric/Behavioral: Negative for agitation and dysphoric mood.       Objective:    Physical Exam Constitutional:      General: She is not in acute distress.    Appearance: Normal appearance.  HENT:     Head: Normocephalic and atraumatic.     Right Ear: External ear normal.     Left Ear: External ear normal.  Eyes:     General: No scleral icterus.       Right eye: No discharge.        Left eye: No discharge.     Conjunctiva/sclera: Conjunctivae normal.  Neck:     Musculoskeletal: Neck supple. No muscular tenderness.     Thyroid: No thyromegaly.  Cardiovascular:     Rate and Rhythm: Normal rate and regular rhythm.  Pulmonary:     Effort: No respiratory distress.     Breath sounds: Normal breath sounds. No wheezing.  Abdominal:     General: Bowel sounds are normal.     Palpations: Abdomen is soft.     Tenderness: There is no abdominal tenderness.  Musculoskeletal:        General: No swelling or tenderness.     Comments: Right foot drop.  Decreased strength:  dorsiflexion and plantar flexion - right foot.  Strength - normal and equal more proximal lower extremities.    Lymphadenopathy:  Cervical: No cervical adenopathy.  Skin:    Findings: No erythema or rash.  Neurological:     Mental Status: She is alert.      Comments: Right foot drop as outlined.    Psychiatric:        Mood and Affect: Mood normal.        Behavior: Behavior normal.     BP (!) 148/78    Pulse 80    Temp 98 F (36.7 C)    Resp 16    Wt 138 lb 12.8 oz (63 kg)    SpO2 97%    BMI 23.10 kg/m  Wt Readings from Last 3 Encounters:  04/01/19 138 lb 12.8 oz (63 kg)  02/26/19 139 lb (63 kg)  02/21/19 137 lb (62.1 kg)     Lab Results  Component Value Date   WBC 8.7 10/18/2018   HGB 14.7 10/18/2018   HCT 43.0 10/18/2018   PLT 166 10/18/2018   GLUCOSE 135 (H) 03/21/2019   CHOL 161 03/21/2019   TRIG 89.0 03/21/2019   HDL 67.50 03/21/2019   LDLCALC 76 03/21/2019   ALT 14 03/21/2019   AST 16 03/21/2019   NA 140 03/21/2019   K 4.0 03/21/2019   CL 104 03/21/2019   CREATININE 0.99 03/21/2019   BUN 17 03/21/2019   CO2 26 03/21/2019   TSH 3.349 10/18/2018   HGBA1C 5.5 03/21/2019    Mm 3d Screen Breast Bilateral  Result Date: 03/10/2019 CLINICAL DATA:  Screening. EXAM: DIGITAL SCREENING BILATERAL MAMMOGRAM WITH TOMO AND CAD COMPARISON:  Previous exam(s). ACR Breast Density Category b: There are scattered areas of fibroglandular density. FINDINGS: There are no findings suspicious for malignancy. Images were processed with CAD. IMPRESSION: No mammographic evidence of malignancy. A result letter of this screening mammogram will be mailed directly to the patient. RECOMMENDATION: Screening mammogram in one year. (Code:SM-B-01Y) BI-RADS CATEGORY  1: Negative. Electronically Signed   By: Lajean Manes M.D.   On: 03/10/2019 13:35       Assessment & Plan:   Problem List Items Addressed This Visit    Aortic atherosclerosis ([place])    On crestor.  Found to have fairly severe calcific stenosis of the aorta around the visceral vessels.  Felt to be a poor candidate for open surgery.        Relevant Medications   hydrALAZINE (APRESOLINE) 25 MG tablet   CAD (coronary artery disease)    Followed by cardiology.  Continue risk factor  modification.       Relevant Medications   hydrALAZINE (APRESOLINE) 25 MG tablet   CKD (chronic kidney disease), stage III ([place])    Followed by nephrology.  Follow metabolic panel.       COPD (chronic obstructive pulmonary disease) ([place])    Followed by Dr Raul Del.  Breathing stable.  Continue current regimen.       Essential hypertension    Blood pressure as outlined.  Elevated some today.  She has been taking one hydralazine per day for the last several days due to difficulty getting prescription. rx sent in.  Continue current medication regimen - tid hydralazine.  If persistent elevation, will need to adjust medication.  Follow pressures.  Follow metabolic panel.        Relevant Medications   hydrALAZINE (APRESOLINE) 25 MG tablet   Foot drop    Feel this is affecting her gait and contributing to her falls.  Also describes weakness.  Will see if can get earlier appt with  neurology for evaluation/NCS.  Also, home health PT to evaluate and treat.        Hypercholesterolemia    On crestor.  Follow lipid panel and liver function tests.        Relevant Medications   hydrALAZINE (APRESOLINE) 25 MG tablet   Polycythemia vera ([place])    Followed by hematology.       Unsteadiness    Describes some weakness and unsteadiness with ambulation.  On exam, pt found to have right foot drop.  Affecting her gait.   Per pt, legs don't feel as strong.  Discussed home health PT to evaluate and treat.  Also, assess for possible AFO brace.  Discussed neurology evaluation.  She has an appt scheduled in January.  See if can get an earlier appt - NCS and evaluation.            Einar Pheasant, MD

## 2019-04-02 ENCOUNTER — Encounter: Payer: Self-pay | Admitting: Internal Medicine

## 2019-04-02 ENCOUNTER — Telehealth: Payer: Self-pay | Admitting: Internal Medicine

## 2019-04-02 DIAGNOSIS — M21379 Foot drop, unspecified foot: Secondary | ICD-10-CM | POA: Insufficient documentation

## 2019-04-02 DIAGNOSIS — N189 Chronic kidney disease, unspecified: Secondary | ICD-10-CM | POA: Diagnosis not present

## 2019-04-02 DIAGNOSIS — I2510 Atherosclerotic heart disease of native coronary artery without angina pectoris: Secondary | ICD-10-CM | POA: Diagnosis not present

## 2019-04-02 DIAGNOSIS — I130 Hypertensive heart and chronic kidney disease with heart failure and stage 1 through stage 4 chronic kidney disease, or unspecified chronic kidney disease: Secondary | ICD-10-CM | POA: Diagnosis not present

## 2019-04-02 DIAGNOSIS — I509 Heart failure, unspecified: Secondary | ICD-10-CM | POA: Diagnosis not present

## 2019-04-02 DIAGNOSIS — M21371 Foot drop, right foot: Secondary | ICD-10-CM | POA: Diagnosis not present

## 2019-04-02 DIAGNOSIS — I700 Atherosclerosis of aorta: Secondary | ICD-10-CM | POA: Diagnosis not present

## 2019-04-02 NOTE — Assessment & Plan Note (Signed)
On crestor.  Follow lipid panel and liver function tests.   

## 2019-04-02 NOTE — Assessment & Plan Note (Signed)
On crestor.  Found to have fairly severe calcific stenosis of the aorta around the visceral vessels.  Felt to be a poor candidate for open surgery.

## 2019-04-02 NOTE — Assessment & Plan Note (Signed)
Followed by Dr Raul Del.  Breathing stable.  Continue current regimen.

## 2019-04-02 NOTE — Assessment & Plan Note (Signed)
Followed by cardiology.  Continue risk factor modification.   

## 2019-04-02 NOTE — Assessment & Plan Note (Signed)
Describes some weakness and unsteadiness with ambulation.  On exam, pt found to have right foot drop.  Affecting her gait.   Per pt, legs don't feel as strong.  Discussed home health PT to evaluate and treat.  Also, assess for possible AFO brace.  Discussed neurology evaluation.  She has an appt scheduled in January.  See if can get an earlier appt - NCS and evaluation.

## 2019-04-02 NOTE — Assessment & Plan Note (Signed)
Blood pressure as outlined.  Elevated some today.  She has been taking one hydralazine per day for the last several days due to difficulty getting prescription. rx sent in.  Continue current medication regimen - tid hydralazine.  If persistent elevation, will need to adjust medication.  Follow pressures.  Follow metabolic panel.

## 2019-04-02 NOTE — Assessment & Plan Note (Signed)
Followed by hematology 

## 2019-04-02 NOTE — Telephone Encounter (Signed)
Please send note from yesterday.  Also, do I need to sign order for AFO brace.  Also, please call and check on pt from her fall yesterday and let her know that I did contact neurology and they are going to try to work her in for earlier appt.

## 2019-04-02 NOTE — Assessment & Plan Note (Signed)
Followed by nephrology.  Follow metabolic panel.  

## 2019-04-02 NOTE — Telephone Encounter (Signed)
Faxed note to home health. Confirmed pt doing ok since fall. Advised her that neurology is going to try to work her in. I will find out about how we need to order brace.

## 2019-04-02 NOTE — Telephone Encounter (Signed)
Sharyn Lull with [facility] calling and states that they helped to initiate the appointment that the patient had with Dr Nicki Reaper yesterday; to help clarify what home health was seeing patient for. States that patient was needing home health therapy for shortness of breath, and low back and leg pain and weakness. States that the patient did not have initially have a face to face appointment, but a virtual visit; and this appointment yesterday was to do the F2F encounter. States that the information regarding why Home Health was needed was not documented in OV notes from F2F visit with Dr Nicki Reaper. States that the notes did not mention weakness or therapy interventions due to pain, weakness, and shortness of breath in regard to Home Health. States that Dr Nicki Reaper is needing to addend the notes from yesterday's visit to include these concerns in order for Home Health to continue seeing patient. States that under their guidelines,there has to be an encounter that addressses the needs for Home Health. Please advise. CB#: [phone] (can leave a voicemail)

## 2019-04-02 NOTE — Assessment & Plan Note (Signed)
Feel this is affecting her gait and contributing to her falls.  Also describes weakness.  Will see if can get earlier appt with neurology for evaluation/NCS.  Also, home health PT to evaluate and treat.

## 2019-04-03 DIAGNOSIS — D45 Polycythemia vera: Secondary | ICD-10-CM | POA: Diagnosis not present

## 2019-04-03 DIAGNOSIS — J449 Chronic obstructive pulmonary disease, unspecified: Secondary | ICD-10-CM | POA: Diagnosis not present

## 2019-04-03 DIAGNOSIS — R0600 Dyspnea, unspecified: Secondary | ICD-10-CM | POA: Diagnosis not present

## 2019-04-03 DIAGNOSIS — R5383 Other fatigue: Secondary | ICD-10-CM | POA: Diagnosis not present

## 2019-04-04 DIAGNOSIS — N189 Chronic kidney disease, unspecified: Secondary | ICD-10-CM | POA: Diagnosis not present

## 2019-04-04 DIAGNOSIS — M21371 Foot drop, right foot: Secondary | ICD-10-CM | POA: Diagnosis not present

## 2019-04-04 DIAGNOSIS — I2510 Atherosclerotic heart disease of native coronary artery without angina pectoris: Secondary | ICD-10-CM | POA: Diagnosis not present

## 2019-04-04 DIAGNOSIS — I509 Heart failure, unspecified: Secondary | ICD-10-CM | POA: Diagnosis not present

## 2019-04-04 DIAGNOSIS — I700 Atherosclerosis of aorta: Secondary | ICD-10-CM | POA: Diagnosis not present

## 2019-04-04 DIAGNOSIS — I130 Hypertensive heart and chronic kidney disease with heart failure and stage 1 through stage 4 chronic kidney disease, or unspecified chronic kidney disease: Secondary | ICD-10-CM | POA: Diagnosis not present

## 2019-04-07 DIAGNOSIS — M5441 Lumbago with sciatica, right side: Secondary | ICD-10-CM | POA: Diagnosis not present

## 2019-04-07 DIAGNOSIS — Z951 Presence of aortocoronary bypass graft: Secondary | ICD-10-CM

## 2019-04-07 DIAGNOSIS — M810 Age-related osteoporosis without current pathological fracture: Secondary | ICD-10-CM | POA: Diagnosis not present

## 2019-04-07 DIAGNOSIS — I2510 Atherosclerotic heart disease of native coronary artery without angina pectoris: Secondary | ICD-10-CM | POA: Diagnosis not present

## 2019-04-07 DIAGNOSIS — Z1501 Genetic susceptibility to malignant neoplasm of breast: Secondary | ICD-10-CM | POA: Diagnosis not present

## 2019-04-07 DIAGNOSIS — I700 Atherosclerosis of aorta: Secondary | ICD-10-CM | POA: Diagnosis not present

## 2019-04-07 DIAGNOSIS — Z8551 Personal history of malignant neoplasm of bladder: Secondary | ICD-10-CM

## 2019-04-07 DIAGNOSIS — Z85828 Personal history of other malignant neoplasm of skin: Secondary | ICD-10-CM

## 2019-04-07 DIAGNOSIS — D45 Polycythemia vera: Secondary | ICD-10-CM | POA: Diagnosis not present

## 2019-04-07 DIAGNOSIS — I509 Heart failure, unspecified: Secondary | ICD-10-CM | POA: Diagnosis not present

## 2019-04-07 DIAGNOSIS — Z87891 Personal history of nicotine dependence: Secondary | ICD-10-CM

## 2019-04-07 DIAGNOSIS — K551 Chronic vascular disorders of intestine: Secondary | ICD-10-CM | POA: Diagnosis not present

## 2019-04-07 DIAGNOSIS — I130 Hypertensive heart and chronic kidney disease with heart failure and stage 1 through stage 4 chronic kidney disease, or unspecified chronic kidney disease: Secondary | ICD-10-CM | POA: Diagnosis not present

## 2019-04-07 DIAGNOSIS — N189 Chronic kidney disease, unspecified: Secondary | ICD-10-CM | POA: Diagnosis not present

## 2019-04-07 DIAGNOSIS — J449 Chronic obstructive pulmonary disease, unspecified: Secondary | ICD-10-CM

## 2019-04-07 DIAGNOSIS — M21371 Foot drop, right foot: Secondary | ICD-10-CM | POA: Diagnosis not present

## 2019-04-18 DIAGNOSIS — I700 Atherosclerosis of aorta: Secondary | ICD-10-CM | POA: Diagnosis not present

## 2019-04-18 DIAGNOSIS — I130 Hypertensive heart and chronic kidney disease with heart failure and stage 1 through stage 4 chronic kidney disease, or unspecified chronic kidney disease: Secondary | ICD-10-CM | POA: Diagnosis not present

## 2019-04-18 DIAGNOSIS — N189 Chronic kidney disease, unspecified: Secondary | ICD-10-CM | POA: Diagnosis not present

## 2019-04-18 DIAGNOSIS — I2510 Atherosclerotic heart disease of native coronary artery without angina pectoris: Secondary | ICD-10-CM | POA: Diagnosis not present

## 2019-04-18 DIAGNOSIS — M21371 Foot drop, right foot: Secondary | ICD-10-CM | POA: Diagnosis not present

## 2019-04-18 DIAGNOSIS — I509 Heart failure, unspecified: Secondary | ICD-10-CM | POA: Diagnosis not present

## 2019-04-22 ENCOUNTER — Other Ambulatory Visit: Payer: Self-pay

## 2019-04-22 ENCOUNTER — Ambulatory Visit
Admission: RE | Admit: 2019-04-22 | Discharge: 2019-04-22 | Disposition: A | Discharge status: Home / Self Care | Payer: Medicare Other | Source: Ambulatory Visit | Attending: Oncology | Admitting: Oncology

## 2019-04-22 DIAGNOSIS — C679 Malignant neoplasm of bladder, unspecified: Secondary | ICD-10-CM | POA: Insufficient documentation

## 2019-04-22 MED ORDER — IOHEXOL 300 MG/ML  SOLN
80.0000 mL | Freq: Once | INTRAMUSCULAR | Status: AC | PRN
  Administered 2019-04-22: 80 mL via INTRAVENOUS

## 2019-04-23 ENCOUNTER — Other Ambulatory Visit: Payer: Self-pay | Admitting: Internal Medicine

## 2019-04-24 ENCOUNTER — Inpatient Hospital Stay: Payer: Medicare Other

## 2019-04-24 ENCOUNTER — Inpatient Hospital Stay: Payer: Medicare Other | Admitting: Oncology

## 2019-04-26 DIAGNOSIS — J449 Chronic obstructive pulmonary disease, unspecified: Secondary | ICD-10-CM | POA: Diagnosis not present

## 2019-04-26 DIAGNOSIS — D45 Polycythemia vera: Secondary | ICD-10-CM | POA: Diagnosis not present

## 2019-04-26 DIAGNOSIS — M21371 Foot drop, right foot: Secondary | ICD-10-CM | POA: Diagnosis not present

## 2019-04-26 DIAGNOSIS — K551 Chronic vascular disorders of intestine: Secondary | ICD-10-CM | POA: Diagnosis not present

## 2019-04-26 DIAGNOSIS — Z87891 Personal history of nicotine dependence: Secondary | ICD-10-CM | POA: Diagnosis not present

## 2019-04-26 DIAGNOSIS — I2510 Atherosclerotic heart disease of native coronary artery without angina pectoris: Secondary | ICD-10-CM | POA: Diagnosis not present

## 2019-04-26 DIAGNOSIS — Z951 Presence of aortocoronary bypass graft: Secondary | ICD-10-CM | POA: Diagnosis not present

## 2019-04-26 DIAGNOSIS — N189 Chronic kidney disease, unspecified: Secondary | ICD-10-CM | POA: Diagnosis not present

## 2019-04-26 DIAGNOSIS — I130 Hypertensive heart and chronic kidney disease with heart failure and stage 1 through stage 4 chronic kidney disease, or unspecified chronic kidney disease: Secondary | ICD-10-CM | POA: Diagnosis not present

## 2019-04-26 DIAGNOSIS — Z1501 Genetic susceptibility to malignant neoplasm of breast: Secondary | ICD-10-CM | POA: Diagnosis not present

## 2019-04-26 DIAGNOSIS — Z85828 Personal history of other malignant neoplasm of skin: Secondary | ICD-10-CM | POA: Diagnosis not present

## 2019-04-26 DIAGNOSIS — I700 Atherosclerosis of aorta: Secondary | ICD-10-CM | POA: Diagnosis not present

## 2019-04-26 DIAGNOSIS — I509 Heart failure, unspecified: Secondary | ICD-10-CM | POA: Diagnosis not present

## 2019-04-26 DIAGNOSIS — M810 Age-related osteoporosis without current pathological fracture: Secondary | ICD-10-CM | POA: Diagnosis not present

## 2019-04-26 DIAGNOSIS — Z8551 Personal history of malignant neoplasm of bladder: Secondary | ICD-10-CM | POA: Diagnosis not present

## 2019-04-26 DIAGNOSIS — M5441 Lumbago with sciatica, right side: Secondary | ICD-10-CM | POA: Diagnosis not present

## 2019-04-28 DIAGNOSIS — I509 Heart failure, unspecified: Secondary | ICD-10-CM | POA: Diagnosis not present

## 2019-04-28 DIAGNOSIS — N189 Chronic kidney disease, unspecified: Secondary | ICD-10-CM | POA: Diagnosis not present

## 2019-04-28 DIAGNOSIS — I700 Atherosclerosis of aorta: Secondary | ICD-10-CM | POA: Diagnosis not present

## 2019-04-28 DIAGNOSIS — I130 Hypertensive heart and chronic kidney disease with heart failure and stage 1 through stage 4 chronic kidney disease, or unspecified chronic kidney disease: Secondary | ICD-10-CM | POA: Diagnosis not present

## 2019-04-28 DIAGNOSIS — M21371 Foot drop, right foot: Secondary | ICD-10-CM | POA: Diagnosis not present

## 2019-04-28 DIAGNOSIS — I2510 Atherosclerotic heart disease of native coronary artery without angina pectoris: Secondary | ICD-10-CM | POA: Diagnosis not present

## 2019-04-30 ENCOUNTER — Telehealth: Payer: Self-pay | Admitting: Internal Medicine

## 2019-04-30 DIAGNOSIS — M21371 Foot drop, right foot: Secondary | ICD-10-CM | POA: Diagnosis not present

## 2019-04-30 DIAGNOSIS — N189 Chronic kidney disease, unspecified: Secondary | ICD-10-CM | POA: Diagnosis not present

## 2019-04-30 DIAGNOSIS — I700 Atherosclerosis of aorta: Secondary | ICD-10-CM | POA: Diagnosis not present

## 2019-04-30 DIAGNOSIS — I2510 Atherosclerotic heart disease of native coronary artery without angina pectoris: Secondary | ICD-10-CM | POA: Diagnosis not present

## 2019-04-30 DIAGNOSIS — I130 Hypertensive heart and chronic kidney disease with heart failure and stage 1 through stage 4 chronic kidney disease, or unspecified chronic kidney disease: Secondary | ICD-10-CM | POA: Diagnosis not present

## 2019-04-30 DIAGNOSIS — I509 Heart failure, unspecified: Secondary | ICD-10-CM | POA: Diagnosis not present

## 2019-04-30 NOTE — Telephone Encounter (Signed)
Called pt  Unable to leave message

## 2019-04-30 NOTE — Telephone Encounter (Signed)
Pt called in said she has had diarrhea for 7 days and tried [employer] but it turns the stool black. She wants to know if Dr. Nicki Reaper will call her in something to stop the diarrhea.    CB [phone]

## 2019-05-01 NOTE — Telephone Encounter (Signed)
She has not been on any abx recently. She is going to try this and let me know if it does not work.

## 2019-05-01 NOTE — Telephone Encounter (Signed)
Has she been on any abx recently?  I would recommend adding a probiotic daily.  (florastor, culturelle or phillips colon health).  Also can add fiber, may help to bulk up the stool.  She can also use kaopectae .  If persistent, we will need to confirm reason for diarrhea.  Continue to stay hydrated.  Bland foods.  If symptoms persists or if any worsening, she needs to be evaluated.

## 2019-05-01 NOTE — Telephone Encounter (Signed)
Patient complaining of diarrhea x8 days. Started after she had a CT scan done. She is not having any abdominal pain or nausea/vomiting or any other acute issues. She says it is not all the time. Mostly after she eats. Based on a 24 hr period she says she would guess she has about 6-8 small loose stools. She is still eating and drinking to stay hydrated. Asked her about her stools being dark- she has not had anymore dark stools or noticed any blood. The only time stool was dark was when she used the pepto bismol. She is wondering if there is something else she can use to help with the diarrhea or if it is ok for her to use the pepto bismol even though it made her stool dark.

## 2019-05-10 ENCOUNTER — Inpatient Hospital Stay
Admission: EM | Admit: 2019-05-10 | Discharge: 2019-05-13 | DRG: 683 | Disposition: A | Discharge status: Home / Self Care | Payer: Medicare Other | Attending: Internal Medicine | Admitting: Internal Medicine

## 2019-05-10 ENCOUNTER — Encounter: Payer: Self-pay | Admitting: Internal Medicine

## 2019-05-10 ENCOUNTER — Other Ambulatory Visit: Payer: Self-pay

## 2019-05-10 DIAGNOSIS — Z8551 Personal history of malignant neoplasm of bladder: Secondary | ICD-10-CM

## 2019-05-10 DIAGNOSIS — E872 Acidosis: Secondary | ICD-10-CM | POA: Diagnosis present

## 2019-05-10 DIAGNOSIS — E860 Dehydration: Secondary | ICD-10-CM | POA: Diagnosis present

## 2019-05-10 DIAGNOSIS — K551 Chronic vascular disorders of intestine: Secondary | ICD-10-CM | POA: Diagnosis present

## 2019-05-10 DIAGNOSIS — Z7982 Long term (current) use of aspirin: Secondary | ICD-10-CM

## 2019-05-10 DIAGNOSIS — R197 Diarrhea, unspecified: Secondary | ICD-10-CM | POA: Diagnosis not present

## 2019-05-10 DIAGNOSIS — E7800 Pure hypercholesterolemia, unspecified: Secondary | ICD-10-CM | POA: Diagnosis present

## 2019-05-10 DIAGNOSIS — Z79899 Other long term (current) drug therapy: Secondary | ICD-10-CM

## 2019-05-10 DIAGNOSIS — Z7902 Long term (current) use of antithrombotics/antiplatelets: Secondary | ICD-10-CM

## 2019-05-10 DIAGNOSIS — D45 Polycythemia vera: Secondary | ICD-10-CM | POA: Diagnosis present

## 2019-05-10 DIAGNOSIS — J449 Chronic obstructive pulmonary disease, unspecified: Secondary | ICD-10-CM | POA: Diagnosis present

## 2019-05-10 DIAGNOSIS — Z888 Allergy status to other drugs, medicaments and biological substances status: Secondary | ICD-10-CM

## 2019-05-10 DIAGNOSIS — I700 Atherosclerosis of aorta: Secondary | ICD-10-CM | POA: Diagnosis present

## 2019-05-10 DIAGNOSIS — Z87891 Personal history of nicotine dependence: Secondary | ICD-10-CM

## 2019-05-10 DIAGNOSIS — Z9221 Personal history of antineoplastic chemotherapy: Secondary | ICD-10-CM

## 2019-05-10 DIAGNOSIS — Z85828 Personal history of other malignant neoplasm of skin: Secondary | ICD-10-CM

## 2019-05-10 DIAGNOSIS — Z803 Family history of malignant neoplasm of breast: Secondary | ICD-10-CM

## 2019-05-10 DIAGNOSIS — I2510 Atherosclerotic heart disease of native coronary artery without angina pectoris: Secondary | ICD-10-CM | POA: Diagnosis present

## 2019-05-10 DIAGNOSIS — Z811 Family history of alcohol abuse and dependence: Secondary | ICD-10-CM

## 2019-05-10 DIAGNOSIS — Z8041 Family history of malignant neoplasm of ovary: Secondary | ICD-10-CM

## 2019-05-10 DIAGNOSIS — K219 Gastro-esophageal reflux disease without esophagitis: Secondary | ICD-10-CM | POA: Diagnosis present

## 2019-05-10 DIAGNOSIS — Z20828 Contact with and (suspected) exposure to other viral communicable diseases: Secondary | ICD-10-CM | POA: Diagnosis present

## 2019-05-10 DIAGNOSIS — E876 Hypokalemia: Secondary | ICD-10-CM | POA: Diagnosis not present

## 2019-05-10 DIAGNOSIS — Z8249 Family history of ischemic heart disease and other diseases of the circulatory system: Secondary | ICD-10-CM

## 2019-05-10 DIAGNOSIS — C679 Malignant neoplasm of bladder, unspecified: Secondary | ICD-10-CM | POA: Diagnosis present

## 2019-05-10 DIAGNOSIS — Z800 Family history of malignant neoplasm of digestive organs: Secondary | ICD-10-CM

## 2019-05-10 DIAGNOSIS — I110 Hypertensive heart disease with heart failure: Secondary | ICD-10-CM | POA: Diagnosis present

## 2019-05-10 DIAGNOSIS — Z86711 Personal history of pulmonary embolism: Secondary | ICD-10-CM

## 2019-05-10 DIAGNOSIS — Z808 Family history of malignant neoplasm of other organs or systems: Secondary | ICD-10-CM

## 2019-05-10 DIAGNOSIS — Z9861 Coronary angioplasty status: Secondary | ICD-10-CM

## 2019-05-10 DIAGNOSIS — J439 Emphysema, unspecified: Secondary | ICD-10-CM | POA: Diagnosis present

## 2019-05-10 DIAGNOSIS — I5032 Chronic diastolic (congestive) heart failure: Secondary | ICD-10-CM | POA: Diagnosis present

## 2019-05-10 DIAGNOSIS — Z7951 Long term (current) use of inhaled steroids: Secondary | ICD-10-CM

## 2019-05-10 DIAGNOSIS — N179 Acute kidney failure, unspecified: Secondary | ICD-10-CM | POA: Diagnosis present

## 2019-05-10 DIAGNOSIS — Z825 Family history of asthma and other chronic lower respiratory diseases: Secondary | ICD-10-CM

## 2019-05-10 DIAGNOSIS — Z8719 Personal history of other diseases of the digestive system: Secondary | ICD-10-CM

## 2019-05-10 DIAGNOSIS — Z8262 Family history of osteoporosis: Secondary | ICD-10-CM

## 2019-05-10 DIAGNOSIS — Z923 Personal history of irradiation: Secondary | ICD-10-CM

## 2019-05-10 DIAGNOSIS — I739 Peripheral vascular disease, unspecified: Secondary | ICD-10-CM | POA: Diagnosis present

## 2019-05-10 DIAGNOSIS — Z1501 Genetic susceptibility to malignant neoplasm of breast: Secondary | ICD-10-CM

## 2019-05-10 DIAGNOSIS — M810 Age-related osteoporosis without current pathological fracture: Secondary | ICD-10-CM | POA: Diagnosis present

## 2019-05-10 DIAGNOSIS — Z832 Family history of diseases of the blood and blood-forming organs and certain disorders involving the immune mechanism: Secondary | ICD-10-CM

## 2019-05-10 DIAGNOSIS — Z66 Do not resuscitate: Secondary | ICD-10-CM | POA: Diagnosis present

## 2019-05-10 LAB — CBC
HCT: 43 % (ref 36.0–46.0)
Hemoglobin: 15.2 g/dL — ABNORMAL HIGH (ref 12.0–15.0)
MCH: 30.5 pg (ref 26.0–34.0)
MCHC: 35.3 g/dL (ref 30.0–36.0)
MCV: 86.3 fL (ref 80.0–100.0)
Platelets: 247 10*3/uL (ref 150–400)
RBC: 4.98 MIL/uL (ref 3.87–5.11)
RDW: 13.2 % (ref 11.5–15.5)
WBC: 9.1 10*3/uL (ref 4.0–10.5)
nRBC: 0 % (ref 0.0–0.2)

## 2019-05-10 LAB — RESPIRATORY PANEL BY RT PCR (FLU A&B, COVID)
Influenza A by PCR: NEGATIVE
Influenza B by PCR: NEGATIVE
SARS Coronavirus 2 by RT PCR: NEGATIVE

## 2019-05-10 LAB — COMPREHENSIVE METABOLIC PANEL
ALT: 23 U/L (ref 0–44)
AST: 29 U/L (ref 15–41)
Albumin: 4.5 g/dL (ref 3.5–5.0)
Alkaline Phosphatase: 54 U/L (ref 38–126)
Anion gap: 18 — ABNORMAL HIGH (ref 5–15)
BUN: 41 mg/dL — ABNORMAL HIGH (ref 8–23)
CO2: 21 mmol/L — ABNORMAL LOW (ref 22–32)
Calcium: 9.6 mg/dL (ref 8.9–10.3)
Chloride: 104 mmol/L (ref 98–111)
Creatinine, Ser: 2.71 mg/dL — ABNORMAL HIGH (ref 0.44–1.00)
GFR calc Af Amer: 18 mL/min — ABNORMAL LOW (ref >60)
GFR calc non Af Amer: 16 mL/min — ABNORMAL LOW (ref >60)
Glucose, Bld: 116 mg/dL — ABNORMAL HIGH (ref 70–99)
Potassium: 2.9 mmol/L — ABNORMAL LOW (ref 3.5–5.1)
Sodium: 143 mmol/L (ref 135–145)
Total Bilirubin: 1.2 mg/dL (ref 0.3–1.2)
Total Protein: 7.7 g/dL (ref 6.5–8.1)

## 2019-05-10 LAB — URINALYSIS, COMPLETE (UACMP) WITH MICROSCOPIC
Bilirubin Urine: NEGATIVE
Glucose, UA: NEGATIVE mg/dL
Hgb urine dipstick: NEGATIVE
Ketones, ur: NEGATIVE mg/dL
Leukocytes,Ua: NEGATIVE
Nitrite: NEGATIVE
Protein, ur: NEGATIVE mg/dL
Specific Gravity, Urine: 1.011 (ref 1.005–1.030)
pH: 5 (ref 5.0–8.0)

## 2019-05-10 LAB — LIPASE, BLOOD: Lipase: 23 U/L (ref 11–51)

## 2019-05-10 LAB — MAGNESIUM: Magnesium: 2.2 mg/dL (ref 1.7–2.4)

## 2019-05-10 MED ORDER — ALBUTEROL SULFATE (2.5 MG/3ML) 0.083% IN NEBU
2.5000 mg | INHALATION_SOLUTION | Freq: Four times a day (QID) | RESPIRATORY_TRACT | Status: DC | PRN
  Administered 2019-05-11 – 2019-05-13 (×6): 2.5 mg via RESPIRATORY_TRACT
  Filled 2019-05-10 (×6): qty 3

## 2019-05-10 MED ORDER — ACETAMINOPHEN 650 MG RE SUPP: 650.0000 mg | Freq: Four times a day (QID) | RECTAL | Status: DC | PRN

## 2019-05-10 MED ORDER — FLUTICASONE PROPIONATE 50 MCG/ACT NA SUSP
2.0000 | Freq: Every day | NASAL | Status: DC
  Administered 2019-05-12 – 2019-05-13 (×2): 2 via NASAL
  Filled 2019-05-10: qty 16

## 2019-05-10 MED ORDER — ENOXAPARIN SODIUM 30 MG/0.3ML ~~LOC~~ SOLN
30.0000 mg | SUBCUTANEOUS | Status: DC
  Administered 2019-05-10 – 2019-05-11 (×2): 30 mg via SUBCUTANEOUS
  Filled 2019-05-10 (×2): qty 0.3

## 2019-05-10 MED ORDER — ENOXAPARIN SODIUM 40 MG/0.4ML ~~LOC~~ SOLN
30.0000 mg | SUBCUTANEOUS | Status: DC
  Filled 2019-05-10: qty 0.4

## 2019-05-10 MED ORDER — ACETAMINOPHEN 325 MG PO TABS: 650.0000 mg | ORAL_TABLET | Freq: Four times a day (QID) | ORAL | Status: DC | PRN

## 2019-05-10 MED ORDER — SODIUM CHLORIDE 0.9 % IV BOLUS
1000.0000 mL | Freq: Once | INTRAVENOUS | Status: AC
  Administered 2019-05-10: 17:00:00 1000 mL via INTRAVENOUS

## 2019-05-10 MED ORDER — PRAVASTATIN SODIUM 40 MG PO TABS
40.0000 mg | ORAL_TABLET | Freq: Every day | ORAL | Status: DC
  Administered 2019-05-10 – 2019-05-12 (×3): 40 mg via ORAL
  Filled 2019-05-10 (×2): qty 2
  Filled 2019-05-10 (×4): qty 1

## 2019-05-10 MED ORDER — ASPIRIN EC 81 MG PO TBEC
81.0000 mg | DELAYED_RELEASE_TABLET | Freq: Every day | ORAL | Status: DC
  Administered 2019-05-10 – 2019-05-13 (×4): 81 mg via ORAL
  Filled 2019-05-10 (×4): qty 1

## 2019-05-10 MED ORDER — HYDRALAZINE HCL 50 MG PO TABS
25.0000 mg | ORAL_TABLET | Freq: Three times a day (TID) | ORAL | Status: DC
  Administered 2019-05-10 – 2019-05-13 (×9): 25 mg via ORAL
  Filled 2019-05-10 (×9): qty 1

## 2019-05-10 MED ORDER — ONDANSETRON HCL 4 MG PO TABS: 4.0000 mg | ORAL_TABLET | Freq: Four times a day (QID) | ORAL | Status: DC | PRN

## 2019-05-10 MED ORDER — LACTATED RINGERS IV SOLN: INTRAVENOUS | Status: DC

## 2019-05-10 MED ORDER — POTASSIUM CHLORIDE 10 MEQ/100ML IV SOLN
10.0000 meq | INTRAVENOUS | Status: AC
  Administered 2019-05-10 (×2): 10 meq via INTRAVENOUS
  Filled 2019-05-10 (×2): qty 100

## 2019-05-10 MED ORDER — CLOPIDOGREL BISULFATE 75 MG PO TABS
75.0000 mg | ORAL_TABLET | Freq: Every day | ORAL | Status: DC
  Administered 2019-05-11 – 2019-05-13 (×3): 75 mg via ORAL
  Filled 2019-05-10 (×4): qty 1

## 2019-05-10 MED ORDER — ALBUTEROL SULFATE HFA 108 (90 BASE) MCG/ACT IN AERS: 2.0000 | INHALATION_SPRAY | Freq: Four times a day (QID) | RESPIRATORY_TRACT | Status: DC | PRN

## 2019-05-10 MED ORDER — VITAMIN D3 25 MCG (1000 UNIT) PO TABS
1000.0000 [IU] | ORAL_TABLET | Freq: Every day | ORAL | Status: DC
  Administered 2019-05-11 – 2019-05-13 (×3): 1000 [IU] via ORAL
  Filled 2019-05-10 (×6): qty 1

## 2019-05-10 MED ORDER — AMLODIPINE BESYLATE 5 MG PO TABS
5.0000 mg | ORAL_TABLET | Freq: Two times a day (BID) | ORAL | Status: DC
  Administered 2019-05-10 – 2019-05-13 (×6): 5 mg via ORAL
  Filled 2019-05-10 (×6): qty 1

## 2019-05-10 MED ORDER — ONDANSETRON HCL 4 MG/2ML IJ SOLN: 4.0000 mg | Freq: Four times a day (QID) | INTRAMUSCULAR | Status: DC | PRN

## 2019-05-10 MED ORDER — METOPROLOL TARTRATE 25 MG PO TABS
25.0000 mg | ORAL_TABLET | Freq: Two times a day (BID) | ORAL | Status: DC
  Administered 2019-05-10 – 2019-05-13 (×6): 25 mg via ORAL
  Filled 2019-05-10 (×6): qty 1

## 2019-05-10 MED ORDER — POTASSIUM CHLORIDE CRYS ER 20 MEQ PO TBCR: 40.0000 meq | EXTENDED_RELEASE_TABLET | Freq: Two times a day (BID) | ORAL | Status: DC

## 2019-05-10 MED ORDER — POTASSIUM CHLORIDE CRYS ER 20 MEQ PO TBCR
40.0000 meq | EXTENDED_RELEASE_TABLET | Freq: Once | ORAL | Status: AC
  Administered 2019-05-10: 19:00:00 40 meq via ORAL
  Filled 2019-05-10: qty 2

## 2019-05-10 MED ORDER — SODIUM CHLORIDE 0.9% FLUSH
3.0000 mL | Freq: Two times a day (BID) | INTRAVENOUS | Status: DC
  Administered 2019-05-10 – 2019-05-13 (×5): 3 mL via INTRAVENOUS

## 2019-05-10 NOTE — ED Triage Notes (Signed)
Pt arrived via POV with reports of diarrhea since Dec 8, pt states she came today because she can't take it much longer with the diarrhea. Pt reports increased weakness and poor PO intake.  Pt reports the diarrhea is watery and occurs multiple times throughout the day and night.  Pt denies any abdominal pain, but has vomited x 2 which she states was green in color.

## 2019-05-10 NOTE — ED Notes (Signed)
Pt assisted to restroom and given warm blanket. RN assisted pt in connecting to the hospital Wi-fi so that she can use her cell phone.

## 2019-05-10 NOTE — ED Notes (Signed)
Pt given a tray of food to tolerate.

## 2019-05-10 NOTE — ED Notes (Addendum)
Pt to the er for diarrhea for several days. Pt had a ct recently with iv dye and was told to increase fluids to flush her system. Pt reports decreased appetite and diarrhea continues even with immodium. Pt denies abd pain. Pt denies covid exposure. MD at bedside.

## 2019-05-10 NOTE — ED Provider Notes (Signed)
[facility] Emergency Department Provider Note  ____________________________________________   First MD Initiated Contact with Patient 05/10/19 1601     (approximate)  I have reviewed the triage vital signs and the nursing notes.   HISTORY  Chief Complaint Diarrhea    HPI Tina [name] is a 84 y.o. female with CHF, COPD, asthma who comes in with diarrhea.  Patient has had diarrhea since December 8.  She is not been eating or drinking well.  Patient states diarrhea has been happening 6 times a day or more.  Had 2 episodes of vomiting but it was over a week ago.  No abdominal pain.  She states the diarrhea is worse every time she eats or drinks, severe, better with Imodium.  She denies being tested for coronavirus.  She denies any recent antibiotics.  Denies having this happen previously.          Past Medical History:  Diagnosis Date  . Anemia   . Asthma   . Bladder cancer ([place])   . BRCA positive   . CAD (coronary artery disease)   . CHF (congestive heart failure) ([place])   . COPD (chronic obstructive pulmonary disease) ([place])   . Emphysema lung ([place])   . GERD (gastroesophageal reflux disease)   . History of colon polyps   . Hypercholesterolemia   . Hyperglycemia   . Hypertension   . Lung nodules   . Osteoporosis   . Personal history of tobacco use, presenting hazards to health 11/25/2014  . Polycythemia vera(238.4)   . Renal cyst   . Skin cancer     Patient Active Problem List   Diagnosis Date Noted  . Foot drop 04/02/2019  . Low back pain 03/16/2019  . Leg pain 03/16/2019  . Aortic atherosclerosis ([place]) 01/07/2019  . Hoarseness 06/23/2018  . Unsteadiness 05/11/2018  . CHF (congestive heart failure) ([place]) 10/20/2017  . Pulmonary embolus ([place]) 06/28/2017  . 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 Goals of care, counseling/discussion 03/02/2017  . Abnormal computed tomography of gastrointestinal tract   . Noninfectious  diarrhea   . Ulceration of intestine   . Hernia, hiatal   . Bladder mass   . Mesenteric ischemia due to arterial insufficiency ([place])   . Polycythemia vera ([place]) 10/21/2016  . Elevated troponin 06/14/2016  . CKD (chronic kidney disease), stage III ([place]) 06/14/2016  . Leukocytosis 06/14/2016  . Abdominal bruit 04/16/2016  . Abnormal mammogram 06/06/2015  . Essential hypertension 02/16/2015  . Numbness 01/24/2015  . Personal history of tobacco use, presenting hazards to health 11/25/2014  . BRCA positive   . COPD exacerbation ([place]) 11/14/2014  . Sensation of pressure in bladder area 08/15/2014  . Health care maintenance 08/15/2014  . Presence of coronary angioplasty implant and graft 09/30/2013  . History of colonic polyps 08/25/2012  . Female genuine stress incontinence 06/28/2012  . Infection of urinary tract 06/28/2012  . CAD (coronary artery disease) 05/23/2012  . COPD (chronic obstructive pulmonary disease) ([place]) 05/23/2012  . Hypercholesterolemia 05/23/2012  . Hyperglycemia 05/23/2012  . Polycythemia, secondary 05/23/2012  . Renal cyst 05/23/2012  . Osteoporosis 05/23/2012    Past Surgical History:  Procedure Laterality Date  . ANGIOPLASTY     coronary (x1)  . COLONOSCOPY WITH PROPOFOL N/A 03/02/2015   Procedure: COLONOSCOPY WITH PROPOFOL;  Surgeon: Lollie Sails, MD;  Location: [facility] ENDOSCOPY;  Service: Endoscopy;  Laterality: N/A;  . COLONOSCOPY WITH PROPOFOL N/A 02/13/2017   Procedure: COLONOSCOPY WITH PROPOFOL;  Surgeon: [name], MD;  Location: [facility] ENDOSCOPY;  Service: Endoscopy;  Laterality: N/A;  . CORONARY ANGIOPLASTY    . CORONARY ARTERY BYPASS GRAFT  09/17/2017   pt denies  . CYSTOSCOPY W/ RETROGRADES Bilateral 02/13/2017   Procedure: CYSTOSCOPY WITH RETROGRADE PYELOGRAM;  Surgeon: Hollice Espy, MD;  Location: [facility] ORS;  Service: Urology;  Laterality: Bilateral;  . CYSTOSCOPY W/ RETROGRADES Bilateral 09/19/2017   Procedure: CYSTOSCOPY WITH RETROGRADE  PYELOGRAM;  Surgeon: Hollice Espy, MD;  Location: [facility] ORS;  Service: Urology;  Laterality: Bilateral;  . CYSTOSCOPY WITH BIOPSY  03/26/2017   Procedure: CYSTOSCOPY WITH BIOPSY;  Surgeon: Hollice Espy, MD;  Location: [facility] ORS;  Service: Urology;;  . Consuela Mimes WITH STENT PLACEMENT Bilateral 02/13/2017   Procedure: CYSTOSCOPY WITH STENT PLACEMENT;  Surgeon: Hollice Espy, MD;  Location: [facility] ORS;  Service: Urology;  Laterality: Bilateral;  . CYSTOSCOPY WITH STENT PLACEMENT Bilateral 03/26/2017   Procedure: CYSTOSCOPY WITH STENT EXCHANGE;  Surgeon: Hollice Espy, MD;  Location: [facility] ORS;  Service: Urology;  Laterality: Bilateral;  . ESOPHAGOGASTRODUODENOSCOPY (EGD) WITH PROPOFOL N/A 02/13/2017   Procedure: ESOPHAGOGASTRODUODENOSCOPY (EGD) WITH PROPOFOL;  Surgeon: [name], MD;  Location: [facility] ENDOSCOPY;  Service: Endoscopy;  Laterality: N/A;  . PORTA CATH INSERTION N/A 02/28/2017   Procedure: PORTA CATH INSERTION;  Surgeon: Algernon Huxley, MD;  Location: [facility] CV LAB;  Service: Cardiovascular;  Laterality: N/A;  . PORTA CATH REMOVAL N/A 12/24/2017   Procedure: PORTA CATH REMOVAL;  Surgeon: Algernon Huxley, MD;  Location: [facility] CV LAB;  Service: Cardiovascular;  Laterality: N/A;  . SALPINGOOPHORECTOMY    . TONSILECTOMY/ADENOIDECTOMY WITH MYRINGOTOMY    . TONSILLECTOMY    . TRANSURETHRAL RESECTION OF BLADDER TUMOR N/A 02/13/2017   Procedure: TRANSURETHRAL RESECTION OF BLADDER TUMOR (TURBT);  Surgeon: Hollice Espy, MD;  Location: [facility] ORS;  Service: Urology;  Laterality: N/A;  . TUBAL LIGATION    . UPPER GI ENDOSCOPY  02/13/2017  . URETEROSCOPY Left 09/19/2017   Procedure: URETEROSCOPY;  Surgeon: Hollice Espy, MD;  Location: [facility] ORS;  Service: Urology;  Laterality: Left;  Marland Kitchen VISCERAL ANGIOGRAPHY N/A 01/23/2019   Procedure: VISCERAL ANGIOGRAPHY;  Surgeon: Algernon Huxley, MD;  Location: [facility] CV LAB;  Service: Cardiovascular;  Laterality: N/A;  . VISCERAL ARTERY  INTERVENTION N/A 02/12/2017   Procedure: VISCERAL ARTERY INTERVENTION;  Surgeon: Algernon Huxley, MD;  Location: [facility] LAB;  Service: Cardiovascular;  Laterality: N/A;    Prior to Admission medications   Medication Sig Start Date End Date Taking? Authorizing Provider  albuterol (PROVENTIL HFA;VENTOLIN HFA) 108 (90 Base) MCG/ACT inhaler Inhale 2 puffs into the lungs every 6 (six) hours as needed for wheezing or shortness of breath. 10/22/17   Bettey Costa, MD  amLODipine (NORVASC) 5 MG tablet TAKE 1 TABLET BY MOUTH  TWICE DAILY 03/18/19   Einar Pheasant, MD  aspirin EC 81 MG tablet Take 1 tablet (81 mg total) by mouth daily. 01/23/19   Algernon Huxley, MD  cholecalciferol (VITAMIN D) 1000 units tablet Take 1,000 Units by mouth daily.    [provider]  clopidogrel (PLAVIX) 75 MG tablet Take 1 tablet (75 mg total) by mouth daily. 03/29/18   Einar Pheasant, MD  fluticasone (FLONASE) 50 MCG/ACT nasal spray USE 2 SPRAYS INTO BOTH  NOSTRILS DAILY AS NEEDED  FOR ALLERGIES. 10/01/18   Einar Pheasant, MD  hydrALAZINE (APRESOLINE) 25 MG tablet TAKE 1 TABLET BY MOUTH 3  TIMES DAILY 04/23/19   Einar Pheasant, MD  ipratropium-albuterol (DUONEB)  0.5-2.5 (3) MG/3ML SOLN Take 3 mLs by nebulization every 6 (six) hours as needed (for shortness of breath). 11/14/14   Joanne Gavel, MD  losartan (COZAAR) 100 MG tablet TAKE 1 TABLET BY MOUTH ONCE DAILY 03/18/19   Einar Pheasant, MD  metoprolol tartrate (LOPRESSOR) 25 MG tablet TAKE 1 TABLET BY MOUTH  TWICE DAILY 03/18/19   Einar Pheasant, MD  OVER THE COUNTER MEDICATION daily.    [provider]  potassium chloride (K-DUR) 10 MEQ tablet TAKE 1 TABLET BY MOUTH  DAILY 10/30/18   Einar Pheasant, MD  rosuvastatin (CRESTOR) 20 MG tablet Take by mouth. 06/28/12   [provider]  SYMBICORT 160-4.5 MCG/ACT inhaler USE 2 PUFFS TWO TIMES DAILY 02/06/18   Einar Pheasant, MD  torsemide (DEMADEX) 20 MG tablet Take 20 mg by mouth every morning. 12/12/17    [provider]    Allergies Evista [raloxifene] and Fluticasone-salmeterol  Family History  Problem Relation Age of Onset  . Cirrhosis Father        died age 52  . Alcohol abuse Father   . Asthma Mother   . Congestive Heart Failure Mother   . Breast cancer Mother        2 (1/2 sisters)  . Osteoarthritis Mother   . Colon cancer Mother   . Lupus Sister   . Alcohol abuse Sister   . Ovarian cancer Sister   . Osteoporosis Sister   . Skin cancer Sister   . Breast cancer Cousin   . Breast cancer Maternal Aunt     Social History Social History   Tobacco Use  . Smoking status: Former Smoker    Packs/day: 1.00    Years: 45.00    Pack years: 45.00    Types: Cigarettes    Quit date: 11/11/2003    Years since quitting: 15.5  . Smokeless tobacco: Never Used  Substance Use Topics  . Alcohol use: Not Currently    Alcohol/week: 0.0 standard drinks  . Drug use: No      Review of Systems Constitutional: No fever/chills Eyes: No visual changes. ENT: No sore throat. Cardiovascular: Denies chest pain. Respiratory: Denies shortness of breath. Gastrointestinal: Positive diarrhea, decreased p.o. intake, vomiting previously but now resolved Genitourinary: Negative for dysuria. Musculoskeletal: Negative for back pain. Skin: Negative for rash. Neurological: Negative for headaches, focal weakness or numbness. All other ROS negative ____________________________________________   PHYSICAL EXAM:  VITAL SIGNS: ED Triage Vitals  Enc Vitals Group     BP 05/10/19 1212 (!) 159/46     Pulse Rate 05/10/19 1212 78     Resp 05/10/19 1212 18     Temp 05/10/19 1212 98.6 F (37 C)     Temp Source 05/10/19 1212 Oral     SpO2 05/10/19 1212 98 %     Weight 05/10/19 1213 133 lb (60.3 kg)     Height 05/10/19 1213 '5\' 5"'  (1.651 m)     Head Circumference --      Peak Flow --      Pain Score 05/10/19 1213 0     Pain Loc --      Pain Edu? --      Excl. in [place]? --      Constitutional: Alert and oriented. Well appearing and in no acute distress. Eyes: Conjunctivae are normal. EOMI. Head: Atraumatic. Nose: No congestion/rhinnorhea. Mouth/Throat: Mucous membranes are moist.   Neck: No stridor. Trachea Midline. FROM Cardiovascular: Normal rate, regular rhythm. Grossly normal heart sounds.  Good  peripheral circulation. Respiratory: Normal respiratory effort.  No retractions. Lungs CTAB. Gastrointestinal: Soft and nontender. No distention. No abdominal bruits.  Musculoskeletal: No lower extremity tenderness nor edema.  No joint effusions. Neurologic:  Normal speech and language. No gross focal neurologic deficits are appreciated.  Skin:  Skin is warm, dry and intact. No rash noted. Psychiatric: Mood and affect are normal. Speech and behavior are normal. GU: Deferred   ____________________________________________   LABS (all labs ordered are listed, but only abnormal results are displayed)  Labs Reviewed  COMPREHENSIVE METABOLIC PANEL - Abnormal; Notable for the following components:      Result Value   Potassium 2.9 (*)    CO2 21 (*)    Glucose, Bld 116 (*)    BUN 41 (*)    Creatinine, Ser 2.71 (*)    GFR calc non Af Amer 16 (*)    GFR calc Af Amer 18 (*)    Anion gap 18 (*)    All other components within normal limits  CBC - Abnormal; Notable for the following components:   Hemoglobin 15.2 (*)    All other components within normal limits  GI PATHOGEN PANEL BY PCR, STOOL  C DIFFICILE QUICK SCREEN W PCR REFLEX  RESPIRATORY PANEL BY RT PCR (FLU A&B, COVID)  LIPASE, BLOOD  MAGNESIUM  URINALYSIS, COMPLETE (UACMP) WITH MICROSCOPIC   ____________________________________________    INITIAL IMPRESSION / ASSESSMENT AND PLAN / ED COURSE  Tina [name] was evaluated in Emergency Department on 05/10/2019 for the symptoms described in the history of present illness. She was evaluated in the context of the global COVID-19 pandemic, which  necessitated consideration that the patient might be at risk for infection with the SARS-CoV-2 virus that causes COVID-19. Institutional protocols and algorithms that pertain to the evaluation of patients at risk for COVID-19 are in a state of rapid change based on information released by regulatory bodies including the CDC and federal and state organizations. These policies and algorithms were followed during the patient's care in the ED.    Patient is an 84 year old who presents with multiple episodes of diarrhea and not eating for the past 3 weeks.  Will get labs evaluate for electrolyte abnormalities, AKI.  Abdomen is soft and nontender suspicion for acute abdominal process.  Will send off stool cultures and coronavirus test.  No evidence of anemia.  No white count elevation.  Labs notable for significant jump in her creatinine up to 2.7 and a BUN of 41.  Potassium also low at 2.9 and an anion gap of 18 showing signs of dehydration  We will give patient 1 L of fluid and 20 of IV K.  Given the significant jump in her creatinine and her symptoms are are still occurring even with Imodium I think the patient needs to be admitted for rehydration while waiting stool sample testing.  Discussed with the hospital team and they will admit patient ____________________________________________   FINAL CLINICAL IMPRESSION(S) / ED DIAGNOSES   Final diagnoses:  Diarrhea, unspecified type  AKI (acute kidney injury) ([place])  Hypokalemia      MEDICATIONS GIVEN DURING THIS VISIT:  Medications  sodium chloride 0.9 % bolus 1,000 mL (has no administration in time range)  potassium chloride 10 mEq in 100 mL IVPB (has no administration in time range)     ED Discharge Orders    None       Note:  This document was prepared using Dragon voice recognition software and may include unintentional dictation  errors.   Vanessa Maxton, MD 05/10/19 [phone]

## 2019-05-10 NOTE — ED Notes (Signed)
Pt alert, states no diarrhea since coming to the ER.

## 2019-05-10 NOTE — ED Notes (Signed)
ED TO INPATIENT HANDOFF REPORT  ED Nurse Name and Phone #:  Dorothyann Gibbs [phone]  S Name/Age/Gender Tina [name] 84 y.o. female Room/Bed: ED26A/ED26A  Code Status   Code Status: DNR  Home/SNF/Other Home Patient oriented to: self, place, time and situation Is this baseline? Yes   Triage Complete: Triage complete  Chief Complaint Diarrhea [R19.7]  Triage Note Pt arrived via POV with reports of diarrhea since Dec 8, pt states she came today because she can't take it much longer with the diarrhea. Pt reports increased weakness and poor PO intake.  Pt reports the diarrhea is watery and occurs multiple times throughout the day and night.  Pt denies any abdominal pain, but has vomited x 2 which she states was green in color.     Allergies Allergies  Allergen Reactions  . Evista [Raloxifene] Other (See Comments)    Night sweats   . Fluticasone-Salmeterol Other (See Comments)    Cough, "chokes me"     Level of Care/Admitting Diagnosis ED Disposition    ED Disposition Condition [facility] Area: [place] [100120]  Level of Care: Med-Surg [16]  Covid Evaluation: Asymptomatic Screening Protocol (No Symptoms)  Diagnosis: Diarrhea [787.91.ICD-9-CM]  Admitting Physician: Lorella Nimrod [[id number]]  Attending Physician: Lorella Nimrod [id number]  PT Class (Do Not Modify): Observation [104]  PT Acc Code (Do Not Modify): Observation [10022]       B Medical/Surgery History Past Medical History:  Diagnosis Date  . Anemia   . Asthma   . Bladder cancer ([place])   . BRCA positive   . CAD (coronary artery disease)   . CHF (congestive heart failure) ([place])   . COPD (chronic obstructive pulmonary disease) ([place])   . Emphysema lung ([place])   . GERD (gastroesophageal reflux disease)   . History of colon polyps   . Hypercholesterolemia   . Hyperglycemia   . Hypertension   . Lung nodules   . Osteoporosis   . Personal history of tobacco use, presenting  hazards to health 11/25/2014  . Polycythemia vera(238.4)   . Renal cyst   . Skin cancer    Past Surgical History:  Procedure Laterality Date  . ANGIOPLASTY     coronary (x1)  . COLONOSCOPY WITH PROPOFOL N/A 03/02/2015   Procedure: COLONOSCOPY WITH PROPOFOL;  Surgeon: Lollie Sails, MD;  Location[facility];  Service: Endoscopy;  Laterality: N/A;  . COLONOSCOPY WITH PROPOFOL N/A 02/13/2017   Procedure: COLONOSCOPY WITH PROPOFOL;  Surgeon: [name], MD;  Location: [facility] ENDOSCOPY;  Service: Endoscopy;  Laterality: N/A;  . CORONARY ANGIOPLASTY    . CORONARY ARTERY BYPASS GRAFT  09/17/2017   pt denies  . CYSTOSCOPY W/ RETROGRADES Bilateral 02/13/2017   Procedure: CYSTOSCOPY WITH RETROGRADE PYELOGRAM;  Surgeon: Hollice Espy, MD;  Location: [facility] ORS;  Service: Urology;  Laterality: Bilateral;  . CYSTOSCOPY W/ RETROGRADES Bilateral 09/19/2017   Procedure: CYSTOSCOPY WITH RETROGRADE PYELOGRAM;  Surgeon: Hollice Espy, MD;  Location: [facility] ORS;  Service: Urology;  Laterality: Bilateral;  . CYSTOSCOPY WITH BIOPSY  03/26/2017   Procedure: CYSTOSCOPY WITH BIOPSY;  Surgeon: Hollice Espy, MD;  Location: [facility] ORS;  Service: Urology;;  . Consuela Mimes WITH STENT PLACEMENT Bilateral 02/13/2017   Procedure: CYSTOSCOPY WITH STENT PLACEMENT;  Surgeon: Hollice Espy, MD;  Location: [facility] ORS;  Service: Urology;  Laterality: Bilateral;  . CYSTOSCOPY WITH STENT PLACEMENT Bilateral 03/26/2017   Procedure: CYSTOSCOPY WITH STENT EXCHANGE;  Surgeon: Hollice Espy, MD;  Location: [facility] ORS;  Service: Urology;  Laterality: Bilateral;  . ESOPHAGOGASTRODUODENOSCOPY (EGD) WITH PROPOFOL N/A 02/13/2017   Procedure: ESOPHAGOGASTRODUODENOSCOPY (EGD) WITH PROPOFOL;  Surgeon: [name], MD;  Location: [facility] ENDOSCOPY;  Service: Endoscopy;  Laterality: N/A;  . PORTA CATH INSERTION N/A 02/28/2017   Procedure: PORTA CATH INSERTION;  Surgeon: Algernon Huxley, MD;  Location: [place] CV LAB;  Service:  Cardiovascular;  Laterality: N/A;  . PORTA CATH REMOVAL N/A 12/24/2017   Procedure: PORTA CATH REMOVAL;  Surgeon: Algernon Huxley, MD;  Location: [facility];  Service: Cardiovascular;  Laterality: N/A;  . SALPINGOOPHORECTOMY    . TONSILECTOMY/ADENOIDECTOMY WITH MYRINGOTOMY    . TONSILLECTOMY    . TRANSURETHRAL RESECTION OF BLADDER TUMOR N/A 02/13/2017   Procedure: TRANSURETHRAL RESECTION OF BLADDER TUMOR (TURBT);  Surgeon: Hollice Espy, MD;  Location: [facility] ORS;  Service: Urology;  Laterality: N/A;  . TUBAL LIGATION    . UPPER GI ENDOSCOPY  02/13/2017  . URETEROSCOPY Left 09/19/2017   Procedure: URETEROSCOPY;  Surgeon: Hollice Espy, MD;  Location: [facility] ORS;  Service: Urology;  Laterality: Left;  Marland Kitchen VISCERAL ANGIOGRAPHY N/A 01/23/2019   Procedure: VISCERAL ANGIOGRAPHY;  Surgeon: Algernon Huxley, MD;  Location: [place] [facility];  Service: Cardiovascular;  Laterality: N/A;  . VISCERAL ARTERY INTERVENTION N/A 02/12/2017   Procedure: VISCERAL ARTERY INTERVENTION;  Surgeon: Algernon Huxley, MD;  Location: [place] [facility];  Service: Cardiovascular;  Laterality: N/A;     A IV Location/Drains/Wounds Patient Lines/Drains/Airways Status   Active Line/Drains/Airways    Name:   Placement date:   Placement time:   Site:   Days:   Peripheral IV 05/10/19 Right Antecubital   05/10/19    1223    Antecubital   less than 1   Sheath 02/12/17   02/12/17    1622    --   817   Urethral Catheter dr Erlene Quan Triple-lumen;Straight-tip;Latex 22 Fr.   02/13/17    1140    Triple-lumen;Straight-tip;Latex   816   Ureteral Drain/Stent Right ureter 6 Fr.   02/13/17    1159    Right ureter   816   Ureteral Drain/Stent Left ureter 6 Fr.   02/13/17    1201    Left ureter   816   Ureteral Drain/Stent Left ureter 6 Fr.   03/26/17    0811    Left ureter   775   Ureteral Drain/Stent Right ureter 6 Fr.   03/26/17    0812    Right ureter   775   Incision (Closed) 02/13/17 Vagina Other (Comment)   02/13/17    1049     816    Incision (Closed) 03/26/17 Vagina Other (Comment)   03/26/17    0808     775   Incision (Closed) 09/19/17 Perineum Other (Comment)   09/19/17    1142     598          Intake/Output Last 24 hours No intake or output data in the 24 hours ending 05/10/19 1855  Labs/Imaging Results for orders placed or performed during the hospital encounter of 05/10/19 (from the past 48 hour(s))  Lipase, blood     Status: None   Collection Time: 05/10/19 12:24 PM  Result Value Ref Range   Lipase 23 11 - 51 U/L    Comment: Performed at [facility], [address], [place], [place] [address]  Comprehensive metabolic panel     Status: Abnormal   Collection Time: 05/10/19 12:24 PM  Result Value  Ref Range   Sodium 143 135 - 145 mmol/L   Potassium 2.9 (L) 3.5 - 5.1 mmol/L   Chloride 104 98 - 111 mmol/L   CO2 21 (L) 22 - 32 mmol/L   Glucose, Bld 116 (H) 70 - 99 mg/dL   BUN 41 (H) 8 - 23 mg/dL   Creatinine, Ser 2.71 (H) 0.44 - 1.00 mg/dL   Calcium 9.6 8.9 - 10.3 mg/dL   Total Protein 7.7 6.5 - 8.1 g/dL   Albumin 4.5 3.5 - 5.0 g/dL   AST 29 15 - 41 U/L   ALT 23 0 - 44 U/L   Alkaline Phosphatase 54 38 - 126 U/L   Total Bilirubin 1.2 0.3 - 1.2 mg/dL   GFR calc non Af Amer 16 (L) >60 mL/min   GFR calc Af Amer 18 (L) >60 mL/min   Anion gap 18 (H) 5 - 15    Comment: Performed at [facility], [address] [place], [place] [address]  CBC     Status: Abnormal   Collection Time: 05/10/19 12:24 PM  Result Value Ref Range   WBC 9.1 4.0 - 10.5 K/uL   RBC 4.98 3.87 - 5.11 MIL/uL   Hemoglobin 15.2 (H) 12.0 - 15.0 g/dL   HCT 43.0 36.0 - 46.0 %   MCV 86.3 80.0 - 100.0 fL   MCH 30.5 26.0 - 34.0 pg   MCHC 35.3 30.0 - 36.0 g/dL   RDW 13.2 11.5 - 15.5 %   Platelets 247 150 - 400 K/uL   nRBC 0.0 0.0 - 0.2 %    Comment: Performed at [facility], [address] [place], [place] [address]  Magnesium     Status: None   Collection Time: 05/10/19 12:24 PM  Result Value Ref Range    Magnesium 2.2 1.7 - 2.4 mg/dL    Comment: Performed at [facility], [address] [place], [place] [address]  Respiratory Panel by RT PCR (Flu A&B, Covid) - Nasopharyngeal Swab     Status: None   Collection Time: 05/10/19  5:13 PM   Specimen: Nasopharyngeal Swab  Result Value Ref Range   SARS Coronavirus 2 by RT PCR NEGATIVE NEGATIVE    Comment: (NOTE) SARS-CoV-2 target nucleic acids are NOT DETECTED. The SARS-CoV-2 RNA is generally detectable in upper respiratoy specimens during the acute phase of infection. The lowest concentration of SARS-CoV-2 viral copies this assay can detect is 131 copies/mL. A negative result does not preclude SARS-Cov-2 infection and should not be used as the sole basis for treatment or other patient management decisions. A negative result may occur with  improper specimen collection/handling, submission of specimen other than nasopharyngeal swab, presence of viral mutation(s) within the areas targeted by this assay, and inadequate number of viral copies (<131 copies/mL). A negative result must be combined with clinical observations, patient history, and epidemiological information. The expected result is Negative. Fact Sheet for Patients:  PinkCheek.be Fact Sheet for Healthcare Providers:  GravelBags.it This test is not yet ap proved or cleared by the [place] FDA and  has been authorized for detection and/or diagnosis of SARS-CoV-2 by FDA under an Emergency Use Authorization (EUA). This EUA will remain  in effect (meaning this test can be used) for the duration of the COVID-19 declaration under Section 564(b)(1) of the Act, 21 U.S.C. section 360bbb-3(b)(1), unless the authorization is terminated or revoked sooner.    Influenza A by PCR NEGATIVE NEGATIVE   Influenza B by PCR NEGATIVE NEGATIVE  Comment: (NOTE) The Xpert Xpress SARS-CoV-2/FLU/RSV assay is intended as an aid in   the diagnosis of influenza from Nasopharyngeal swab specimens and  should not be used as a sole basis for treatment. Nasal washings and  aspirates are unacceptable for Xpert Xpress SARS-CoV-2/FLU/RSV  testing. Fact Sheet for Patients: PinkCheek.be Fact Sheet for Healthcare Providers: GravelBags.it This test is not yet approved or cleared by the [place] FDA and  has been authorized for detection and/or diagnosis of SARS-CoV-2 by  FDA under an Emergency Use Authorization (EUA). This EUA will remain  in effect (meaning this test can be used) for the duration of the  Covid-19 declaration under Section 564(b)(1) of the Act, 21  U.S.C. section 360bbb-3(b)(1), unless the authorization is  terminated or revoked. Performed at [facility], [address] [place], [place] [address]    No results found.  Pending Labs Unresulted Labs (From admission, onward)    Start     Ordered   05/17/19 0500  Creatinine, serum  (enoxaparin (LOVENOX)    CrCl < 30 ml/min)  Weekly,   STAT    Comments: while on enoxaparin therapy.    05/10/19 1747   05/11/19 0500  TSH  Tomorrow morning,   STAT     05/10/19 1747   05/11/19 3875  Basic metabolic panel  Tomorrow morning,   STAT     05/10/19 1747   05/11/19 0500  CBC  Tomorrow morning,   STAT     05/10/19 1747   05/10/19 1741  CBC  (enoxaparin (LOVENOX)    CrCl < 30 ml/min)  Once,   STAT    Comments: Baseline for enoxaparin therapy IF NOT ALREADY DRAWN.  Notify MD if PLT < 100 K.    05/10/19 1747   05/10/19 1741  Creatinine, serum  (enoxaparin (LOVENOX)    CrCl < 30 ml/min)  Once,   STAT    Comments: Baseline for enoxaparin therapy IF NOT ALREADY DRAWN.    05/10/19 1747   05/10/19 1604  GI pathogen panel by PCR, stool  (Gastrointestinal Panel by PCR, Stool                                                                                                                                                      *Does Not include CLOSTRIDIUM DIFFICILE testing.**If CDIFF testing is needed, select the C Difficile Quick Screen w PCR reflex order below)  Once,   STAT     05/10/19 1604   05/10/19 1604  C difficile quick scan w PCR reflex  (C Difficile quick screen w PCR reflex panel)  Once, for 24 hours,   STAT     05/10/19 1604   05/10/19 1215  Urinalysis, Complete w Microscopic  ONCE - STAT,   STAT     05/10/19 1214  Vitals/Pain Today's Vitals   05/10/19 1700 05/10/19 1730 05/10/19 1800 05/10/19 1830  BP: (!) 154/53 (!) 181/71 (!) 185/73 (!) 137/55  Pulse: 75 85 85 79  Resp: 18     Temp:      TempSrc:      SpO2: 93% 94% 93% 92%  Weight:      Height:      PainSc:        Isolation Precautions Enteric precautions (UV disinfection)  Medications Medications  potassium chloride 10 mEq in 100 mL IVPB (10 mEq Intravenous New Bag/Given 05/10/19 1805)  enoxaparin (LOVENOX) injection 30 mg (has no administration in time range)  sodium chloride flush (NS) 0.9 % injection 3 mL (has no administration in time range)  lactated ringers infusion (has no administration in time range)  acetaminophen (TYLENOL) tablet 650 mg (has no administration in time range)    Or  acetaminophen (TYLENOL) suppository 650 mg (has no administration in time range)  ondansetron (ZOFRAN) tablet 4 mg (has no administration in time range)    Or  ondansetron (ZOFRAN) injection 4 mg (has no administration in time range)  albuterol (VENTOLIN HFA) 108 (90 Base) MCG/ACT inhaler 2 puff (has no administration in time range)  amLODipine (NORVASC) tablet 5 mg (has no administration in time range)  aspirin EC tablet 81 mg (has no administration in time range)  cholecalciferol (VITAMIN D) tablet 1,000 Units (has no administration in time range)  clopidogrel (PLAVIX) tablet 75 mg (has no administration in time range)  fluticasone (FLONASE) 50 MCG/ACT nasal spray 2 spray (has no administration in time range)   hydrALAZINE (APRESOLINE) tablet 25 mg (has no administration in time range)  pravastatin (PRAVACHOL) tablet 40 mg (has no administration in time range)  metoprolol tartrate (LOPRESSOR) tablet 25 mg (has no administration in time range)  sodium chloride 0.9 % bolus 1,000 mL (1,000 mLs Intravenous New Bag/Given 05/10/19 1653)  potassium chloride SA (KLOR-CON) CR tablet 40 mEq (40 mEq Oral Given 05/10/19 1834)    Mobility walks Low fall risk   Focused Assessments n/a   R Recommendations: See Admitting Provider Note  Report given to:   Additional Notes: n/a

## 2019-05-10 NOTE — ED Notes (Addendum)
Secretary to put in transport request to move pt to 146

## 2019-05-10 NOTE — H&P (Signed)
History and Physical    Tina [name] TMB:[id number] DOB: Nov 24, 1934 DOA: 05/10/2019  PCP: Einar Pheasant, MD   Patient coming from:   I have personally briefly reviewed patient's old medical records in [place]  Chief Complaint: Diarrhea since 04/22/2019  HPI: Tina [name] is a 84 y.o. female with medical history significant of COPD, secondary polycythemia,HFpEF and bladder cancer came to ED with complaints of diarrhea since December 8 after getting CT and abdomen with contrast for follow-up of her bladder cancer. She denies any abdominal pain.  Had couple of vomitus a week ago, no current nausea or vomiting.  According to patient she gets diarrhea whenever she eats or drink.  Imodium does help.  She denies taking any recent antibiotics.  Denies any recent travel or eating out of her normal routine.  She denies any fever or chills.  No urinary symptoms except that she is urinating less than normal.  Denies any hematochezia or hematuria.  No sick contacts.  ED Course: On presentation she was hemodynamically stable with mildly elevated blood pressure at 152/50, labs positive for hemoglobin of 15.2, no leukocytosis, potassium of 2.9, magnesium of 2.2, BUN of 41 and creatinine of 2.71 with baseline around 1.  She was admitted for IV fluids.  Review of Systems: As per HPI otherwise 10 point review of systems negative.   Past Medical History:  Diagnosis Date  . Anemia   . Asthma   . Bladder cancer ([place])   . BRCA positive   . CAD (coronary artery disease)   . CHF (congestive heart failure) ([place])   . COPD (chronic obstructive pulmonary disease) ([place])   . Emphysema lung ([place])   . GERD (gastroesophageal reflux disease)   . History of colon polyps   . Hypercholesterolemia   . Hyperglycemia   . Hypertension   . Lung nodules   . Osteoporosis   . Personal history of tobacco use, presenting hazards to health 11/25/2014  . Polycythemia vera(238.4)   . Renal cyst   . Skin cancer      Past Surgical History:  Procedure Laterality Date  . ANGIOPLASTY     coronary (x1)  . COLONOSCOPY WITH PROPOFOL N/A 03/02/2015   Procedure: COLONOSCOPY WITH PROPOFOL;  Surgeon: Lollie Sails, MD;  Location: [facility] ENDOSCOPY;  Service: Endoscopy;  Laterality: N/A;  . COLONOSCOPY WITH PROPOFOL N/A 02/13/2017   Procedure: COLONOSCOPY WITH PROPOFOL;  Surgeon: [name], MD;  Location: [facility] ENDOSCOPY;  Service: Endoscopy;  Laterality: N/A;  . CORONARY ANGIOPLASTY    . CORONARY ARTERY BYPASS GRAFT  09/17/2017   pt denies  . CYSTOSCOPY W/ RETROGRADES Bilateral 02/13/2017   Procedure: CYSTOSCOPY WITH RETROGRADE PYELOGRAM;  Surgeon: Hollice Espy, MD;  Location: [facility] ORS;  Service: Urology;  Laterality: Bilateral;  . CYSTOSCOPY W/ RETROGRADES Bilateral 09/19/2017   Procedure: CYSTOSCOPY WITH RETROGRADE PYELOGRAM;  Surgeon: Hollice Espy, MD;  Location: [facility] ORS;  Service: Urology;  Laterality: Bilateral;  . CYSTOSCOPY WITH BIOPSY  03/26/2017   Procedure: CYSTOSCOPY WITH BIOPSY;  Surgeon: Hollice Espy, MD;  Location: [facility] ORS;  Service: Urology;;  . Consuela Mimes WITH STENT PLACEMENT Bilateral 02/13/2017   Procedure: CYSTOSCOPY WITH STENT PLACEMENT;  Surgeon: Hollice Espy, MD;  Location: [facility] ORS;  Service: Urology;  Laterality: Bilateral;  . CYSTOSCOPY WITH STENT PLACEMENT Bilateral 03/26/2017   Procedure: CYSTOSCOPY WITH STENT EXCHANGE;  Surgeon: Hollice Espy, MD;  Location: [facility] ORS;  Service: Urology;  Laterality: Bilateral;  . ESOPHAGOGASTRODUODENOSCOPY (EGD) WITH PROPOFOL N/A 02/13/2017  Procedure: ESOPHAGOGASTRODUODENOSCOPY (EGD) WITH PROPOFOL;  Surgeon: [name], MD;  Location: [facility] ENDOSCOPY;  Service: Endoscopy;  Laterality: N/A;  . PORTA CATH INSERTION N/A 02/28/2017   Procedure: PORTA CATH INSERTION;  Surgeon: Algernon Huxley, MD;  Location: [place] CV LAB;  Service: Cardiovascular;  Laterality: N/A;  . PORTA CATH REMOVAL N/A 12/24/2017   Procedure: PORTA CATH  REMOVAL;  Surgeon: Algernon Huxley, MD;  Location: [place] CV LAB;  Service: Cardiovascular;  Laterality: N/A;  . SALPINGOOPHORECTOMY    . TONSILECTOMY/ADENOIDECTOMY WITH MYRINGOTOMY    . TONSILLECTOMY    . TRANSURETHRAL RESECTION OF BLADDER TUMOR N/A 02/13/2017   Procedure: TRANSURETHRAL RESECTION OF BLADDER TUMOR (TURBT);  Surgeon: Hollice Espy, MD;  Location: [facility] ORS;  Service: Urology;  Laterality: N/A;  . TUBAL LIGATION    . UPPER GI ENDOSCOPY  02/13/2017  . URETEROSCOPY Left 09/19/2017   Procedure: URETEROSCOPY;  Surgeon: Hollice Espy, MD;  Location: [facility] ORS;  Service: Urology;  Laterality: Left;  Marland Kitchen VISCERAL ANGIOGRAPHY N/A 01/23/2019   Procedure: VISCERAL ANGIOGRAPHY;  Surgeon: Algernon Huxley, MD;  Location: [place] CV LAB;  Service: Cardiovascular;  Laterality: N/A;  . VISCERAL ARTERY INTERVENTION N/A 02/12/2017   Procedure: VISCERAL ARTERY INTERVENTION;  Surgeon: Algernon Huxley, MD;  Location: [place] CV LAB;  Service: Cardiovascular;  Laterality: N/A;     reports that she quit smoking about 15 years ago. Her smoking use included cigarettes. She has a 45.00 pack-year smoking history. She has never used smokeless tobacco. She reports previous alcohol use. She reports that she does not use drugs.  Allergies  Allergen Reactions  . Evista [Raloxifene] Other (See Comments)    Night sweats   . Fluticasone-Salmeterol Other (See Comments)    Cough, "chokes me"     Family History  Problem Relation Age of Onset  . Cirrhosis Father        died age 59  . Alcohol abuse Father   . Asthma Mother   . Congestive Heart Failure Mother   . Breast cancer Mother        2 (1/2 sisters)  . Osteoarthritis Mother   . Colon cancer Mother   . Lupus Sister   . Alcohol abuse Sister   . Ovarian cancer Sister   . Osteoporosis Sister   . Skin cancer Sister   . Breast cancer Cousin   . Breast cancer Maternal Aunt     Prior to Admission medications   Medication Sig Start Date End  Date Taking? Authorizing Provider  albuterol (PROVENTIL HFA;VENTOLIN HFA) 108 (90 Base) MCG/ACT inhaler Inhale 2 puffs into the lungs every 6 (six) hours as needed for wheezing or shortness of breath. 10/22/17   Bettey Costa, MD  amLODipine (NORVASC) 5 MG tablet TAKE 1 TABLET BY MOUTH  TWICE DAILY Patient taking differently: Take 5 mg by mouth 2 (two) times daily.  03/18/19   Einar Pheasant, MD  aspirin EC 81 MG tablet Take 1 tablet (81 mg total) by mouth daily. 01/23/19   Algernon Huxley, MD  cholecalciferol (VITAMIN D) 1000 units tablet Take 1,000 Units by mouth daily.    [provider]  clopidogrel (PLAVIX) 75 MG tablet Take 1 tablet (75 mg total) by mouth daily. 03/29/18   Einar Pheasant, MD  fluticasone (FLONASE) 50 MCG/ACT nasal spray USE 2 SPRAYS INTO BOTH  NOSTRILS DAILY AS NEEDED  FOR ALLERGIES. Patient taking differently: Place 2 sprays into both nostrils daily.  10/01/18   Einar Pheasant, MD  hydrALAZINE (APRESOLINE) 25 MG tablet TAKE 1 TABLET BY MOUTH 3  TIMES DAILY Patient taking differently: Take 25 mg by mouth 3 (three) times daily.  04/23/19   Einar Pheasant, MD  ipratropium-albuterol (DUONEB) 0.5-2.5 (3) MG/3ML SOLN Take 3 mLs by nebulization every 6 (six) hours as needed (for shortness of breath). 11/14/14   Joanne Gavel, MD  losartan (COZAAR) 100 MG tablet TAKE 1 TABLET BY MOUTH ONCE DAILY Patient taking differently: Take 100 mg by mouth daily. TAKE 1 TABLET BY MOUTH ONCE DAILY 03/18/19   Einar Pheasant, MD  lovastatin (MEVACOR) 40 MG tablet Take 40 mg by mouth daily. 04/23/19   [provider]  metoprolol tartrate (LOPRESSOR) 25 MG tablet TAKE 1 TABLET BY MOUTH  TWICE DAILY Patient taking differently: Take 25 mg by mouth 2 (two) times daily.  03/18/19   Einar Pheasant, MD  OVER THE COUNTER MEDICATION daily.    [provider]  potassium chloride (K-DUR) 10 MEQ tablet TAKE 1 TABLET BY MOUTH  DAILY Patient taking differently: Take 10 mEq by mouth daily.   10/30/18   Einar Pheasant, MD  SYMBICORT 160-4.5 MCG/ACT inhaler USE 2 PUFFS TWO TIMES DAILY 02/06/18   Einar Pheasant, MD  torsemide [facility]) 20 MG tablet Take 20 mg by mouth every morning. 12/12/17   [provider]    Physical Exam: Vitals:   05/10/19 1213 05/10/19 1611 05/10/19 1630 05/10/19 1700  BP:  (!) 152/52 (!) 152/50 (!) 154/53  Pulse:  82 73 75  Resp:    18  Temp:      TempSrc:      SpO2:  95% 95% 93%  Weight: 60.3 kg     Height: '5\' 5"'  (1.651 m)       General: Vital signs reviewed.  Patient is well-developed and well-nourished, in no acute distress and cooperative with exam.  Head: Normocephalic and atraumatic. Eyes: EOMI, conjunctivae normal, no scleral icterus.  ENMT: Mucous membranes are dry.  Neck: Supple, trachea midline, normal ROM, no JVD, masses, thyromegaly, or carotid bruit present.  Cardiovascular: RRR, S1 normal, S2 normal, no murmurs, gallops, or rubs. Pulmonary/Chest: Clear to auscultation bilaterally, no wheezes, rales, or rhonchi. Abdominal: Soft, non-tender, non-distended, BS +, no masses, organomegaly, or guarding present.  Extremities: No lower extremity edema bilaterally,  pulses symmetric and intact bilaterally. No cyanosis or clubbing. Neurological: A&O x3, Strength is normal and symmetric bilaterally, cranial nerve II-XII are grossly intact, no focal motor deficit, sensory intact to light touch bilaterally.  Skin: Warm, dry and intact. No rashes or erythema. Psychiatric: Normal mood and affect. speech and behavior is normal. Cognition and memory are normal.   Labs on Admission: I have personally reviewed following labs and imaging studies  CBC: Recent Labs  Lab 05/10/19 1224  WBC 9.1  HGB 15.2*  HCT 43.0  MCV 86.3  PLT 128   Basic Metabolic Panel: Recent Labs  Lab 05/10/19 1224  NA 143  K 2.9*  CL 104  CO2 21*  GLUCOSE 116*  BUN 41*  CREATININE 2.71*  CALCIUM 9.6  MG 2.2   GFR: Estimated Creatinine Clearance: 14.4  mL/min (A) (by C-G formula based on SCr of 2.71 mg/dL (H)). Liver Function Tests: Recent Labs  Lab 05/10/19 1224  AST 29  ALT 23  ALKPHOS 54  BILITOT 1.2  PROT 7.7  ALBUMIN 4.5   Recent Labs  Lab 05/10/19 1224  LIPASE 23   No results for input(s): AMMONIA in the last 168 hours. Coagulation  Profile: No results for input(s): INR, PROTIME in the last 168 hours. Cardiac Enzymes: No results for input(s): CKTOTAL, CKMB, CKMBINDEX, TROPONINI in the last 168 hours. BNP (last 3 results) No results for input(s): PROBNP in the last 8760 hours. HbA1C: No results for input(s): HGBA1C in the last 72 hours. CBG: No results for input(s): GLUCAP in the last 168 hours. Lipid Profile: No results for input(s): CHOL, HDL, LDLCALC, TRIG, CHOLHDL, LDLDIRECT in the last 72 hours. Thyroid Function Tests: No results for input(s): TSH, T4TOTAL, FREET4, T3FREE, THYROIDAB in the last 72 hours. Anemia Panel: No results for input(s): VITAMINB12, FOLATE, FERRITIN, TIBC, IRON, RETICCTPCT in the last 72 hours. Urine analysis:    Component Value Date/Time   COLORURINE STRAW (A) 10/18/2018 0914   APPEARANCEUR CLEAR (A) 10/18/2018 0914   APPEARANCEUR Clear 09/13/2017 1502   LABSPEC 1.008 10/18/2018 0914   PHURINE 6.0 10/18/2018 0914   GLUCOSEU NEGATIVE 10/18/2018 0914   GLUCOSEU NEGATIVE 10/09/2017 1121   HGBUR NEGATIVE 10/18/2018 0914   BILIRUBINUR NEGATIVE 10/18/2018 0914   BILIRUBINUR Negative 09/13/2017 1502   KETONESUR NEGATIVE 10/18/2018 0914   PROTEINUR NEGATIVE 10/18/2018 0914   UROBILINOGEN 0.2 10/09/2017 1121   NITRITE NEGATIVE 10/18/2018 0914   LEUKOCYTESUR NEGATIVE 10/18/2018 0914    Radiological Exams on Admission: No results found.  Assessment/Plan Active Problems:   Diarrhea   Diarrhea.  Had colonoscopy in 2018 which shows diverticulosis.  Patient has not prior history of mesenteric ischemia due to peripheral vascular disease. 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 Less likely to have a inflammatory or infectious cause as patient is afebrile with no leukocytosis and no associated pain.  Less likely C. difficile as there is no recent use of antibiotics and no prior history of C. difficile colitis. Patient had radiation 2 years ago, occasionally patient can get radiation induced colitis after few years too.  No sign of inflammation. She received 1 L in ED -LR at 100 mL/h. -Checking for C. difficile and stool pathogens. -Imodium as needed. -Might need an outpatient GI appointment.  AKI.  BUN of 41 with creatinine of 2.71 with baseline around 1. Most likely secondary to diarrhea and she was taking torsemide and losartan. Appears dehydrated. -IV fluid. -Hold torsemide-told patient to hold torsemide until diarrhea resolves, she should weigh herself daily and monitor her respiratory status, if she started gaining weight she can start torsemide. -Hold losartan-can be restarted once renal function improves.  Hypokalemia.  Potassium of 2.9 with magnesium of 2.2. -Replete electrolyte and monitor  Hypertension.  Blood pressure elevated. -Continuing home amlodipine and hydralazine.  COPD.  No sign of acute exacerbation.  CAD.  No chest pain. -Continuing home dose of metoprolol. -Continuing home dose of aspirin and Plavix. -Continuing home dose of statin.  HFpEF.  Echo done last year with some diastolic dysfunction. Patient was on torsemide 20 mg daily at home. Patient appears dehydrated. -Holding torsemide -Daily weight. -Intake and output.  History of small cell carcinoma of bladder.  Surveillance CT chest and abdomen earlier this month was negative for any disease recurrence. Being monitored by oncologist.  Secondary polycythemia.  Most likely secondary to smoking.  Hemoglobin at 15.2. -Managed by hematologist.  DVT prophylaxis: Lovenox Code Status: DNR Family Communication: No family at  bedside Disposition Plan: Most likely home tomorrow/pending improvement in renal function with fluid. Consults called: None Admission status: Observation   Lorella Nimrod MD [facility] Pager [phone]  If  7PM-7AM, please contact night-coverage [email or web address] Password [facility]  05/10/2019, 6:14 PM   This record has been created using Dragon voice recognition software. Errors have been sought and corrected,but may not always be located. Such creation errors do not reflect on the standard of care.

## 2019-05-11 ENCOUNTER — Encounter: Payer: Self-pay | Admitting: Internal Medicine

## 2019-05-11 DIAGNOSIS — K551 Chronic vascular disorders of intestine: Secondary | ICD-10-CM | POA: Diagnosis present

## 2019-05-11 DIAGNOSIS — Z85828 Personal history of other malignant neoplasm of skin: Secondary | ICD-10-CM | POA: Diagnosis not present

## 2019-05-11 DIAGNOSIS — I5032 Chronic diastolic (congestive) heart failure: Secondary | ICD-10-CM | POA: Diagnosis present

## 2019-05-11 DIAGNOSIS — M810 Age-related osteoporosis without current pathological fracture: Secondary | ICD-10-CM | POA: Diagnosis present

## 2019-05-11 DIAGNOSIS — Z811 Family history of alcohol abuse and dependence: Secondary | ICD-10-CM | POA: Diagnosis not present

## 2019-05-11 DIAGNOSIS — I110 Hypertensive heart disease with heart failure: Secondary | ICD-10-CM | POA: Diagnosis present

## 2019-05-11 DIAGNOSIS — Z8719 Personal history of other diseases of the digestive system: Secondary | ICD-10-CM | POA: Diagnosis not present

## 2019-05-11 DIAGNOSIS — D45 Polycythemia vera: Secondary | ICD-10-CM | POA: Diagnosis present

## 2019-05-11 DIAGNOSIS — J410 Simple chronic bronchitis: Secondary | ICD-10-CM | POA: Diagnosis not present

## 2019-05-11 DIAGNOSIS — J439 Emphysema, unspecified: Secondary | ICD-10-CM | POA: Diagnosis present

## 2019-05-11 DIAGNOSIS — N179 Acute kidney failure, unspecified: Secondary | ICD-10-CM | POA: Diagnosis present

## 2019-05-11 DIAGNOSIS — Z20828 Contact with and (suspected) exposure to other viral communicable diseases: Secondary | ICD-10-CM | POA: Diagnosis present

## 2019-05-11 DIAGNOSIS — I700 Atherosclerosis of aorta: Secondary | ICD-10-CM | POA: Diagnosis present

## 2019-05-11 DIAGNOSIS — Z87891 Personal history of nicotine dependence: Secondary | ICD-10-CM | POA: Diagnosis not present

## 2019-05-11 DIAGNOSIS — E872 Acidosis: Secondary | ICD-10-CM | POA: Diagnosis present

## 2019-05-11 DIAGNOSIS — Z825 Family history of asthma and other chronic lower respiratory diseases: Secondary | ICD-10-CM | POA: Diagnosis not present

## 2019-05-11 DIAGNOSIS — Z8249 Family history of ischemic heart disease and other diseases of the circulatory system: Secondary | ICD-10-CM | POA: Diagnosis not present

## 2019-05-11 DIAGNOSIS — E7800 Pure hypercholesterolemia, unspecified: Secondary | ICD-10-CM | POA: Diagnosis present

## 2019-05-11 DIAGNOSIS — E876 Hypokalemia: Secondary | ICD-10-CM

## 2019-05-11 DIAGNOSIS — Z888 Allergy status to other drugs, medicaments and biological substances status: Secondary | ICD-10-CM | POA: Diagnosis not present

## 2019-05-11 DIAGNOSIS — I2510 Atherosclerotic heart disease of native coronary artery without angina pectoris: Secondary | ICD-10-CM | POA: Diagnosis present

## 2019-05-11 DIAGNOSIS — Z66 Do not resuscitate: Secondary | ICD-10-CM | POA: Diagnosis present

## 2019-05-11 DIAGNOSIS — K219 Gastro-esophageal reflux disease without esophagitis: Secondary | ICD-10-CM | POA: Diagnosis present

## 2019-05-11 DIAGNOSIS — R197 Diarrhea, unspecified: Secondary | ICD-10-CM | POA: Diagnosis present

## 2019-05-11 DIAGNOSIS — Z803 Family history of malignant neoplasm of breast: Secondary | ICD-10-CM | POA: Diagnosis not present

## 2019-05-11 LAB — BASIC METABOLIC PANEL
Anion gap: 11 (ref 5–15)
BUN: 32 mg/dL — ABNORMAL HIGH (ref 8–23)
CO2: 21 mmol/L — ABNORMAL LOW (ref 22–32)
Calcium: 8.6 mg/dL — ABNORMAL LOW (ref 8.9–10.3)
Chloride: 109 mmol/L (ref 98–111)
Creatinine, Ser: 1.72 mg/dL — ABNORMAL HIGH (ref 0.44–1.00)
GFR calc Af Amer: 32 mL/min — ABNORMAL LOW (ref >60)
GFR calc non Af Amer: 27 mL/min — ABNORMAL LOW (ref >60)
Glucose, Bld: 112 mg/dL — ABNORMAL HIGH (ref 70–99)
Potassium: 3.2 mmol/L — ABNORMAL LOW (ref 3.5–5.1)
Sodium: 141 mmol/L (ref 135–145)

## 2019-05-11 LAB — PHOSPHORUS: Phosphorus: 2.4 mg/dL — ABNORMAL LOW (ref 2.5–4.6)

## 2019-05-11 LAB — CBC
HCT: 36.1 % (ref 36.0–46.0)
Hemoglobin: 12.7 g/dL (ref 12.0–15.0)
MCH: 30.5 pg (ref 26.0–34.0)
MCHC: 35.2 g/dL (ref 30.0–36.0)
MCV: 86.6 fL (ref 80.0–100.0)
Platelets: 195 K/uL (ref 150–400)
RBC: 4.17 MIL/uL (ref 3.87–5.11)
RDW: 13.3 % (ref 11.5–15.5)
WBC: 8.2 K/uL (ref 4.0–10.5)
nRBC: 0 % (ref 0.0–0.2)

## 2019-05-11 LAB — GLUCOSE, CAPILLARY: Glucose-Capillary: 105 mg/dL — ABNORMAL HIGH (ref 70–99)

## 2019-05-11 LAB — C DIFFICILE QUICK SCREEN W PCR REFLEX
C Diff antigen: NEGATIVE
C Diff interpretation: NOT DETECTED
C Diff toxin: NEGATIVE

## 2019-05-11 LAB — TSH: TSH: 1.919 u[IU]/mL (ref 0.350–4.500)

## 2019-05-11 MED ORDER — MOMETASONE FURO-FORMOTEROL FUM 200-5 MCG/ACT IN AERO
2.0000 | INHALATION_SPRAY | Freq: Two times a day (BID) | RESPIRATORY_TRACT | Status: DC
  Administered 2019-05-11 – 2019-05-13 (×5): 2 via RESPIRATORY_TRACT
  Filled 2019-05-11: qty 8.8

## 2019-05-11 MED ORDER — KCL-LACTATED RINGERS 20 MEQ/L IV SOLN
INTRAVENOUS | Status: DC
  Filled 2019-05-11 (×3): qty 1000

## 2019-05-11 MED ORDER — LOPERAMIDE HCL 2 MG PO CAPS
2.0000 mg | ORAL_CAPSULE | Freq: Four times a day (QID) | ORAL | Status: DC | PRN
  Administered 2019-05-11 (×2): 2 mg via ORAL
  Filled 2019-05-11 (×3): qty 1

## 2019-05-11 MED ORDER — CHLORHEXIDINE GLUCONATE CLOTH 2 % EX PADS
6.0000 | MEDICATED_PAD | Freq: Every day | CUTANEOUS | Status: DC
  Administered 2019-05-11 – 2019-05-13 (×3): 6 via TOPICAL

## 2019-05-11 MED ORDER — POTASSIUM CHLORIDE 2 MEQ/ML IV SOLN
INTRAVENOUS | Status: DC
  Filled 2019-05-11 (×3): qty 1000

## 2019-05-11 MED ORDER — KCL-LACTATED RINGERS 20 MEQ/L IV SOLN: INTRAVENOUS | Status: DC

## 2019-05-11 MED ORDER — LOPERAMIDE HCL 2 MG PO CAPS
2.0000 mg | ORAL_CAPSULE | ORAL | Status: DC | PRN
  Filled 2019-05-11: qty 1

## 2019-05-11 NOTE — Progress Notes (Signed)
Awaiting GI pathogen report- cdiff negative. Continued enteric/contact precautions. Imodium given to abdominal cramping related to diarrhea with benefit reported. IVF' s continued with K+ suppliment. Up to [facility] independently.

## 2019-05-11 NOTE — Progress Notes (Addendum)
Progress Note    Tina [name]  TGY:[id number] DOB: [birth year]  DOA: 05/10/2019 PCP: Einar Pheasant, MD      Brief Narrative:    Medical records reviewed and are as summarized below:  Tina [name] is an 84 y.o. female with multiple medical problems including COPD/asthma, secondary polycythemia, chronic diastolic CHF, CAD, hypertension, history of blood cancer, presented to the hospital with a 19-day history (started on December 8) of diarrhea following ingestion of contrast for CT abdomen and pelvis.  Stool for C. difficile toxin was negative.  She was admitted for diarrhea complicated by hypokalemia and acute kidney injury.      Assessment/Plan:   Principal Problem:   Diarrhea Active Problems:   COPD (chronic obstructive pulmonary disease) (HCC)   Small cell carcinoma of bladder (HCC)   AKI (acute kidney injury) (HCC)   Hypokalemia   Body mass index is 22.93 kg/m.    Diarrhea complicated by normal anion gap metabolic acidosis: Etiology unclear.  Stool for C. difficile toxin was negative.  Imodium as needed for diarrhea.  Acute kidney injury: Continue IV fluids monitor BMP  Hypokalemia: Replete potassium  Hypertension/chronic diastolic CHF: Hold torsemide and losartan because of AKI.  Continue amlodipine and hydralazine.  COPD/asthma: Compensated.  Continue bronchodilators  CAD: She is on aspirin, Plavix and pravastatin.   Family Communication/Anticipated D/C date and plan/Code Status   DVT prophylaxis: Lovenox Code Status: Full code Family Communication: Plan discussed with the patient Disposition Plan: Possible discharge to home in 1 to 2 days      Subjective:   She complains of generalized weakness and diarrhea.  She said she had 4 watery stools this morning.  Stools were nonbloody.  No abdominal pain, vomiting or fever.  Objective:    Vitals:   05/10/19 2012 05/10/19 2339 05/11/19 0700 05/11/19 0715  BP: (!) 143/65 (!) 174/56 (!) 143/47    Pulse: 85 78 70   Resp: 16 16 16    Temp: 98.8 F (37.1 C) 97.9 F (36.6 C) (!) 97.5 F (36.4 C)   TempSrc: Oral Oral Oral   SpO2: 95% 95% 95%   Weight:    62.5 kg  Height:        Intake/Output Summary (Last 24 hours) at 05/11/2019 1038 Last data filed at 05/11/2019 0919 Gross per 24 hour  Intake 1993.66 ml  Output 100 ml  Net 1893.66 ml   Filed Weights   05/10/19 1213 05/11/19 0715  Weight: 60.3 kg 62.5 kg    Exam:  GEN: NAD SKIN: No rash EYES: EOMI ENT: MMM CV: RRR PULM: CTA B ABD: soft, ND, NT, +BS CNS: AAO x 3, non focal EXT: No edema or tenderness   Data Reviewed:   I have personally reviewed following labs and imaging studies:  Labs: Labs show the following:   Basic Metabolic Panel: Recent Labs  Lab 05/10/19 1224 05/11/19 0519  NA 143 141  K 2.9* 3.2*  CL 104 109  CO2 21* 21*  GLUCOSE 116* 112*  BUN 41* 32*  CREATININE 2.71* 1.72*  CALCIUM 9.6 8.6*  MG 2.2  --   PHOS  --  2.4*   GFR Estimated Creatinine Clearance: 22.7 mL/min (A) (by C-G formula based on SCr of 1.72 mg/dL (H)). Liver Function Tests: Recent Labs  Lab 05/10/19 1224  AST 29  ALT 23  ALKPHOS 54  BILITOT 1.2  PROT 7.7  ALBUMIN 4.5   Recent Labs  Lab 05/10/19 1224  LIPASE 23  No results for input(s): AMMONIA in the last 168 hours. Coagulation profile No results for input(s): INR, PROTIME in the last 168 hours.  CBC: Recent Labs  Lab 05/10/19 1224 05/11/19 0519  WBC 9.1 8.2  HGB 15.2* 12.7  HCT 43.0 36.1  MCV 86.3 86.6  PLT 247 195   Cardiac Enzymes: No results for input(s): CKTOTAL, CKMB, CKMBINDEX, TROPONINI in the last 168 hours. BNP (last 3 results) No results for input(s): PROBNP in the last 8760 hours. CBG: Recent Labs  Lab 05/11/19 0807  GLUCAP 105*   D-Dimer: No results for input(s): DDIMER in the last 72 hours. Hgb A1c: No results for input(s): HGBA1C in the last 72 hours. Lipid Profile: No results for input(s): CHOL, HDL, LDLCALC,  TRIG, CHOLHDL, LDLDIRECT in the last 72 hours. Thyroid function studies: Recent Labs    05/11/19 0519  TSH 1.919   Anemia work up: No results for input(s): VITAMINB12, FOLATE, FERRITIN, TIBC, IRON, RETICCTPCT in the last 72 hours. Sepsis Labs: Recent Labs  Lab 05/10/19 1224 05/11/19 0519  WBC 9.1 8.2    Microbiology Recent Results (from the past 240 hour(s))  Respiratory Panel by RT PCR (Flu A&B, Covid) - Nasopharyngeal Swab     Status: None   Collection Time: 05/10/19  5:13 PM   Specimen: Nasopharyngeal Swab  Result Value Ref Range Status   SARS Coronavirus 2 by RT PCR NEGATIVE NEGATIVE Final    Comment: (NOTE) SARS-CoV-2 target nucleic acids are NOT DETECTED. The SARS-CoV-2 RNA is generally detectable in upper respiratoy specimens during the acute phase of infection. The lowest concentration of SARS-CoV-2 viral copies this assay can detect is 131 copies/mL. A negative result does not preclude SARS-Cov-2 infection and should not be used as the sole basis for treatment or other patient management decisions. A negative result may occur with  improper specimen collection/handling, submission of specimen other than nasopharyngeal swab, presence of viral mutation(s) within the areas targeted by this assay, and inadequate number of viral copies (<131 copies/mL). A negative result must be combined with clinical observations, patient history, and epidemiological information. The expected result is Negative. Fact Sheet for Patients:  PinkCheek.be Fact Sheet for Healthcare Providers:  GravelBags.it This test is not yet ap proved or cleared by the [place] FDA and  has been authorized for detection and/or diagnosis of SARS-CoV-2 by FDA under an Emergency Use Authorization (EUA). This EUA will remain  in effect (meaning this test can be used) for the duration of the COVID-19 declaration under Section 564(b)(1) of the  Act, 21 U.S.C. section 360bbb-3(b)(1), unless the authorization is terminated or revoked sooner.    Influenza A by PCR NEGATIVE NEGATIVE Final   Influenza B by PCR NEGATIVE NEGATIVE Final    Comment: (NOTE) The Xpert Xpress SARS-CoV-2/FLU/RSV assay is intended as an aid in  the diagnosis of influenza from Nasopharyngeal swab specimens and  should not be used as a sole basis for treatment. Nasal washings and  aspirates are unacceptable for Xpert Xpress SARS-CoV-2/FLU/RSV  testing. Fact Sheet for Patients: PinkCheek.be Fact Sheet for Healthcare Providers: GravelBags.it This test is not yet approved or cleared by the [place] FDA and  has been authorized for detection and/or diagnosis of SARS-CoV-2 by  FDA under an Emergency Use Authorization (EUA). This EUA will remain  in effect (meaning this test can be used) for the duration of the  Covid-19 declaration under Section 564(b)(1) of the Act, 21  U.S.C. section 360bbb-3(b)(1), unless the authorization is  terminated  or revoked. Performed at [facility], [address] [place], [place] [address]   C difficile quick scan w PCR reflex     Status: None   Collection Time: 05/11/19  4:41 AM   Specimen: STOOL  Result Value Ref Range Status   C Diff antigen NEGATIVE NEGATIVE Final   C Diff toxin NEGATIVE NEGATIVE Final   C Diff interpretation No C. difficile detected.  Final    Comment: Performed at [facility], [address] [place], [place] [address]    Procedures and diagnostic studies:  No results found.  Medications:   . amLODipine  5 mg Oral BID  . aspirin EC  81 mg Oral Daily  . cholecalciferol  1,000 Units Oral Daily  . clopidogrel  75 mg Oral Daily  . enoxaparin (LOVENOX) injection  30 mg Subcutaneous Q24H  . fluticasone  2 spray Each Nare Daily  . hydrALAZINE  25 mg Oral TID  . metoprolol tartrate  25 mg Oral BID  .  mometasone-formoterol  2 puff Inhalation BID  . pravastatin  40 mg Oral q1800  . sodium chloride flush  3 mL Intravenous Q12H   Continuous Infusions: . lactated ringers with KCl 20 mEq/L 75 mL/hr at 05/11/19 0922     LOS: 0 days   Thurmon Mizell  [facility]   *Please refer to [email or web address], password [email or web address] to get updated schedule on who will round on this patient, as hospitalists switch teams weekly. If 7PM-7AM, please contact night-coverage at [email or web address], password [email or web address] for any overnight needs.  05/11/2019, 10:38 AM

## 2019-05-12 LAB — BASIC METABOLIC PANEL
Anion gap: 8 (ref 5–15)
BUN: 17 mg/dL (ref 8–23)
CO2: 21 mmol/L — ABNORMAL LOW (ref 22–32)
Calcium: 8.4 mg/dL — ABNORMAL LOW (ref 8.9–10.3)
Chloride: 114 mmol/L — ABNORMAL HIGH (ref 98–111)
Creatinine, Ser: 0.87 mg/dL (ref 0.44–1.00)
GFR calc Af Amer: >60 mL/min (ref >60)
GFR calc non Af Amer: >60 mL/min (ref >60)
Glucose, Bld: 98 mg/dL (ref 70–99)
Potassium: 3.2 mmol/L — ABNORMAL LOW (ref 3.5–5.1)
Sodium: 143 mmol/L (ref 135–145)

## 2019-05-12 LAB — MAGNESIUM: Magnesium: 1.8 mg/dL (ref 1.7–2.4)

## 2019-05-12 LAB — PHOSPHORUS: Phosphorus: 2.1 mg/dL — ABNORMAL LOW (ref 2.5–4.6)

## 2019-05-12 MED ORDER — ENOXAPARIN SODIUM 40 MG/0.4ML ~~LOC~~ SOLN
40.0000 mg | SUBCUTANEOUS | Status: DC
  Administered 2019-05-12: 40 mg via SUBCUTANEOUS
  Filled 2019-05-12: qty 0.4

## 2019-05-12 MED ORDER — K PHOS MONO-SOD PHOS DI & MONO 155-852-130 MG PO TABS
500.0000 mg | ORAL_TABLET | Freq: Three times a day (TID) | ORAL | Status: DC
  Administered 2019-05-12 – 2019-05-13 (×4): 500 mg via ORAL
  Filled 2019-05-12 (×4): qty 2

## 2019-05-12 NOTE — Progress Notes (Addendum)
Progress Note    Tina [name]  ERD:[id number] DOB: May 19, 1934  DOA: 05/10/2019 PCP: Tina Pheasant, MD      Brief Narrative:    Medical records reviewed and are as summarized below:  Tina [name] is an 84 y.o. female with multiple medical problems including COPD/asthma, secondary polycythemia, chronic diastolic CHF, CAD, aortic atherosclerosis, mesenteric ischemia with history of SMA stent placement, hypertension, history of blood cancer, presented to the hospital with a 19-day history (started on December 8) of diarrhea following ingestion of contrast for CT abdomen and pelvis.  Stool for C. difficile toxin was negative.  She was admitted for diarrhea complicated by hypokalemia and acute kidney injury.      Assessment/Plan:   Principal Problem:   Diarrhea Active Problems:   COPD (chronic obstructive pulmonary disease) (HCC)   Small cell carcinoma of bladder (HCC)   AKI (acute kidney injury) (HCC)   Hypokalemia   Body mass index is 22.93 kg/m.    Diarrhea complicated by normal anion gap metabolic acidosis: Etiology unclear.  Stool for C. difficile toxin was negative.  Stool for GI pathogen panel is pending.  Imodium as needed for diarrhea.  Acute kidney injury: Resolved.  Discontinue IV fluids avoid fluid overload.  Hypokalemia and hypophosphatemia: Replete potassium phosphorus respectively  Hypertension/chronic diastolic CHF: Hold torsemide and losartan because of AKI.  Continue amlodipine and hydralazine.  COPD/asthma: Compensated.  Continue bronchodilators  CAD/aortic atherosclerosis/history of mesenteric ischemia and placement to SMA: She is on aspirin, Plavix and pravastatin.  History of bladder cancer remission   Family Communication/Anticipated D/C date and plan/Code Status   DVT prophylaxis: Lovenox Code Status: Full code Family Communication: Plan discussed with the patient Disposition Plan: Possible discharge to home  tomorrow      Subjective:   She feels better today but not quite back to baseline.  She thinks Imodium is helping with her diarrhea.  No diarrhea this morning.  She still feels a little weak. .  Objective:    Vitals:   05/12/19 0721 05/12/19 0738 05/12/19 1354 05/12/19 1521  BP: (!) 144/55   (!) 146/62  Pulse: 74   75  Resp: 17   17  Temp: 98.3 F (36.8 C)   98.7 F (37.1 C)  TempSrc: Oral   Oral  SpO2: 94% 95% 95% 97%  Weight:      Height:        Intake/Output Summary (Last 24 hours) at 05/12/2019 1609 Last data filed at 05/12/2019 0931 Gross per 24 hour  Intake 713.75 ml  Output --  Net 713.75 ml   Filed Weights   05/10/19 1213 05/11/19 0715  Weight: 60.3 kg 62.5 kg    Exam:  GEN: NAD SKIN: No rash EYES: Anicteric ENT: MMM CV: RRR PULM: CTA B ABD: soft, ND, NT, +BS CNS: AAO x 3, non focal EXT: No edema or tenderness    Data Reviewed:   I have personally reviewed following labs and imaging studies:  Labs: Labs show the following:   Basic Metabolic Panel: Recent Labs  Lab 05/10/19 1224 05/11/19 0519 05/12/19 0713  NA 143 141 143  K 2.9* 3.2* 3.2*  CL 104 109 114*  CO2 21* 21* 21*  GLUCOSE 116* 112* 98  BUN 41* 32* 17  CREATININE 2.71* 1.72* 0.87  CALCIUM 9.6 8.6* 8.4*  MG 2.2  --  1.8  PHOS  --  2.4* 2.1*   GFR Estimated Creatinine Clearance: 44.9 mL/min (by C-G formula based  on SCr of 0.87 mg/dL). Liver Function Tests: Recent Labs  Lab 05/10/19 1224  AST 29  ALT 23  ALKPHOS 54  BILITOT 1.2  PROT 7.7  ALBUMIN 4.5   Recent Labs  Lab 05/10/19 1224  LIPASE 23   No results for input(s): AMMONIA in the last 168 hours. Coagulation profile No results for input(s): INR, PROTIME in the last 168 hours.  CBC: Recent Labs  Lab 05/10/19 1224 05/11/19 0519  WBC 9.1 8.2  HGB 15.2* 12.7  HCT 43.0 36.1  MCV 86.3 86.6  PLT 247 195   Cardiac Enzymes: No results for input(s): CKTOTAL, CKMB, CKMBINDEX, TROPONINI in the last  168 hours. BNP (last 3 results) No results for input(s): PROBNP in the last 8760 hours. CBG: Recent Labs  Lab 05/11/19 0807  GLUCAP 105*   D-Dimer: No results for input(s): DDIMER in the last 72 hours. Hgb A1c: No results for input(s): HGBA1C in the last 72 hours. Lipid Profile: No results for input(s): CHOL, HDL, LDLCALC, TRIG, CHOLHDL, LDLDIRECT in the last 72 hours. Thyroid function studies: Recent Labs    05/11/19 0519  TSH 1.919   Anemia work up: No results for input(s): VITAMINB12, FOLATE, FERRITIN, TIBC, IRON, RETICCTPCT in the last 72 hours. Sepsis Labs: Recent Labs  Lab 05/10/19 1224 05/11/19 0519  WBC 9.1 8.2    Microbiology Recent Results (from the past 240 hour(s))  Respiratory Panel by RT PCR (Flu A&B, Covid) - Nasopharyngeal Swab     Status: None   Collection Time: 05/10/19  5:13 PM   Specimen: Nasopharyngeal Swab  Result Value Ref Range Status   SARS Coronavirus 2 by RT PCR NEGATIVE NEGATIVE Final    Comment: (NOTE) SARS-CoV-2 target nucleic acids are NOT DETECTED. The SARS-CoV-2 RNA is generally detectable in upper respiratoy specimens during the acute phase of infection. The lowest concentration of SARS-CoV-2 viral copies this assay can detect is 131 copies/mL. A negative result does not preclude SARS-Cov-2 infection and should not be used as the sole basis for treatment or other patient management decisions. A negative result may occur with  improper specimen collection/handling, submission of specimen other than nasopharyngeal swab, presence of viral mutation(s) within the areas targeted by this assay, and inadequate number of viral copies (<131 copies/mL). A negative result must be combined with clinical observations, patient history, and epidemiological information. The expected result is Negative. Fact Sheet for Patients:  PinkCheek.be Fact Sheet for Healthcare Providers:   GravelBags.it This test is not yet ap proved or cleared by the [place] FDA and  has been authorized for detection and/or diagnosis of SARS-CoV-2 by FDA under an Emergency Use Authorization (EUA). This EUA will remain  in effect (meaning this test can be used) for the duration of the COVID-19 declaration under Section 564(b)(1) of the Act, 21 U.S.C. section 360bbb-3(b)(1), unless the authorization is terminated or revoked sooner.    Influenza A by PCR NEGATIVE NEGATIVE Final   Influenza B by PCR NEGATIVE NEGATIVE Final    Comment: (NOTE) The Xpert Xpress SARS-CoV-2/FLU/RSV assay is intended as an aid in  the diagnosis of influenza from Nasopharyngeal swab specimens and  should not be used as a sole basis for treatment. Nasal washings and  aspirates are unacceptable for Xpert Xpress SARS-CoV-2/FLU/RSV  testing. Fact Sheet for Patients: PinkCheek.be Fact Sheet for Healthcare Providers: GravelBags.it This test is not yet approved or cleared by the [place] FDA and  has been authorized for detection and/or diagnosis of SARS-CoV-2 by  FDA under an Emergency Use Authorization (EUA). This EUA will remain  in effect (meaning this test can be used) for the duration of the  Covid-19 declaration under Section 564(b)(1) of the Act, 21  U.S.C. section 360bbb-3(b)(1), unless the authorization is  terminated or revoked. Performed at [facility], [address] [place], [place] [address]   C difficile quick scan w PCR reflex     Status: None   Collection Time: 05/11/19  4:41 AM   Specimen: STOOL  Result Value Ref Range Status   C Diff antigen NEGATIVE NEGATIVE Final   C Diff toxin NEGATIVE NEGATIVE Final   C Diff interpretation No C. difficile detected.  Final    Comment: Performed at [facility], [address] [place], [place] [address]    Procedures and diagnostic  studies:  No results found.  Medications:   . amLODipine  5 mg Oral BID  . aspirin EC  81 mg Oral Daily  . Chlorhexidine Gluconate Cloth  6 each Topical Daily  . cholecalciferol  1,000 Units Oral Daily  . clopidogrel  75 mg Oral Daily  . enoxaparin (LOVENOX) injection  40 mg Subcutaneous Q24H  . fluticasone  2 spray Each Nare Daily  . hydrALAZINE  25 mg Oral TID  . metoprolol tartrate  25 mg Oral BID  . mometasone-formoterol  2 puff Inhalation BID  . phosphorus  500 mg Oral TID  . pravastatin  40 mg Oral q1800  . sodium chloride flush  3 mL Intravenous Q12H   Continuous Infusions:    LOS: 1 day   Merridith Dershem  [facility]   *Please refer to [email or web address], password [email or web address] to get updated schedule on who will round on this patient, as hospitalists switch teams weekly. If 7PM-7AM, please contact night-coverage at [email or web address], password [email or web address] for any overnight needs.  05/12/2019, 4:09 PM

## 2019-05-12 NOTE — Progress Notes (Signed)
PHARMACIST - PHYSICIAN COMMUNICATION  CONCERNING:  Enoxaparin (Lovenox) for DVT Prophylaxis   RECOMMENDATION: Patient was prescribed enoxaprin 30mg  q24 hours for VTE prophylaxis.   Filed Weights   05/10/19 1213 05/11/19 0715  Weight: 133 lb (60.3 kg) 137 lb 12.6 oz (62.5 kg)    Body mass index is 22.93 kg/m.  Estimated Creatinine Clearance: 44.9 mL/min (by C-G formula based on SCr of 0.87 mg/dL).  Patient is candidate for enoxaparin 40mg  every 24 hours based on CrCl >53ml/min and  Weight > 45kg  DESCRIPTION: Pharmacy has adjusted enoxaparin dose per [facility] policy.  Patient is now receiving enoxaparin 40mg  every 24 hours.   Pernell Dupre, PharmD, BCPS Clinical Pharmacist 05/12/2019 9:10 AM

## 2019-05-13 LAB — BASIC METABOLIC PANEL
Anion gap: 11 (ref 5–15)
BUN: 12 mg/dL (ref 8–23)
CO2: 23 mmol/L (ref 22–32)
Calcium: 8.3 mg/dL — ABNORMAL LOW (ref 8.9–10.3)
Chloride: 108 mmol/L (ref 98–111)
Creatinine, Ser: 0.81 mg/dL (ref 0.44–1.00)
GFR calc Af Amer: >60 mL/min (ref >60)
GFR calc non Af Amer: >60 mL/min (ref >60)
Glucose, Bld: 108 mg/dL — ABNORMAL HIGH (ref 70–99)
Potassium: 2.8 mmol/L — ABNORMAL LOW (ref 3.5–5.1)
Sodium: 142 mmol/L (ref 135–145)

## 2019-05-13 LAB — GI PATHOGEN PANEL BY PCR, STOOL

## 2019-05-13 LAB — PHOSPHORUS: Phosphorus: 2.9 mg/dL (ref 2.5–4.6)

## 2019-05-13 LAB — POTASSIUM: Potassium: 3.3 mmol/L — ABNORMAL LOW (ref 3.5–5.1)

## 2019-05-13 LAB — MAGNESIUM: Magnesium: 1.7 mg/dL (ref 1.7–2.4)

## 2019-05-13 LAB — GLUCOSE, CAPILLARY: Glucose-Capillary: 102 mg/dL — ABNORMAL HIGH (ref 70–99)

## 2019-05-13 MED ORDER — ALUM & MAG HYDROXIDE-SIMETH 200-200-20 MG/5ML PO SUSP: 15.0000 mL | ORAL | Status: DC | PRN

## 2019-05-13 MED ORDER — POTASSIUM CHLORIDE CRYS ER 20 MEQ PO TBCR
40.0000 meq | EXTENDED_RELEASE_TABLET | ORAL | Status: AC
  Administered 2019-05-13 (×2): 40 meq via ORAL
  Filled 2019-05-13 (×2): qty 2

## 2019-05-13 MED ORDER — ALUM & MAG HYDROXIDE-SIMETH 200-200-20 MG/5ML PO SUSP
30.0000 mL | ORAL | Status: DC | PRN
  Administered 2019-05-13 (×3): 30 mL via ORAL
  Filled 2019-05-13 (×3): qty 30

## 2019-05-13 NOTE — Discharge Summary (Signed)
Physician Discharge Summary  Tina [name] OTL:[id number] DOB: 04/25/1935 DOA: 05/10/2019  PCP: Einar Pheasant, MD  Admit date: 05/10/2019 Discharge date: 05/13/2019  Discharge disposition: Home    Recommendations for Outpatient Follow-Up:   Outpatient follow-up with PCP in 1 week   Discharge Diagnosis:   Principal Problem:   Diarrhea Active Problems:   COPD (chronic obstructive pulmonary disease) ([place])   Small cell carcinoma of bladder ([place])   AKI (acute kidney injury) ([place])   Hypokalemia    Discharge Condition: Stable.  Diet recommendation: Low-salt diet  Code status: DNR   Hospital Course:    Tina [name] is an 84 y.o. female with multiple medical problems including COPD/asthma, secondary polycythemia, chronic diastolic CHF, CAD, aortic atherosclerosis, mesenteric ischemia with history of SMA stent placement, hypertension, history of blood cancer in remission, presented to the hospital with a 19-day history (started on December 8) of diarrhea following ingestion of contrast for CT abdomen and pelvis.  Stool for C. difficile toxin was negative.  She was admitted for diarrhea complicated by hypokalemia and acute kidney injury.  She was treated with IV fluids.  Hypokalemia and hypophosphatemia were treated..  Of note, patient has history of hypokalemia and she is on potassium supplement at home.  Hypokalemia was likely exacerbated by recent diarrhea.  Diarrhea and AKI have resolved and hyperkalemia has improved.  The etiology of diarrhea was not established.  Her condition has improved and she is deemed stable for discharge to home today.  Follow-up with PCP is recommended to monitor kidney function and electrolytes.    Discharge Exam:   Vitals:   05/13/19 1409 05/13/19 1611  BP:  (!) 146/61  Pulse:  84  Resp:    Temp:  98.6 F (37 C)  SpO2: 95% 96%   Vitals:   05/13/19 0454 05/13/19 0822 05/13/19 1409 05/13/19 1611  BP:  (!) 174/63  (!) 146/61  Pulse:  79   84  Resp:      Temp:  98.2 F (36.8 C)  98.6 F (37 C)  TempSrc:  Oral  Oral  SpO2:  95% 95% 96%  Weight: 63 kg     Height:         GEN: NAD SKIN: No rash EYES: EOMI ENT: MMM CV: RRR PULM: CTA B ABD: soft, ND, NT, +BS CNS: AAO x 3, non focal EXT: No edema or tenderness   The results of significant diagnostics from this hospitalization (including imaging, microbiology, ancillary and laboratory) are listed below for reference.     Procedures and Diagnostic Studies:   No results found.   Labs:   Basic Metabolic Panel: Recent Labs  Lab 05/10/19 1224 05/11/19 0519 05/12/19 0713 05/13/19 0755 05/13/19 1451  NA 143 141 143 142  --   K 2.9* 3.2* 3.2* 2.8* 3.3*  CL 104 109 114* 108  --   CO2 21* 21* 21* 23  --   GLUCOSE 116* 112* 98 108*  --   BUN 41* 32* 17 12  --   CREATININE 2.71* 1.72* 0.87 0.81  --   CALCIUM 9.6 8.6* 8.4* 8.3*  --   MG 2.2  --  1.8 1.7  --   PHOS  --  2.4* 2.1* 2.9  --    GFR Estimated Creatinine Clearance: 48.2 mL/min (by C-G formula based on SCr of 0.81 mg/dL). Liver Function Tests: Recent Labs  Lab 05/10/19 1224  AST 29  ALT 23  ALKPHOS 54  BILITOT 1.2  PROT  7.7  ALBUMIN 4.5   Recent Labs  Lab 05/10/19 1224  LIPASE 23   No results for input(s): AMMONIA in the last 168 hours. Coagulation profile No results for input(s): INR, PROTIME in the last 168 hours.  CBC: Recent Labs  Lab 05/10/19 1224 05/11/19 0519  WBC 9.1 8.2  HGB 15.2* 12.7  HCT 43.0 36.1  MCV 86.3 86.6  PLT 247 195   Cardiac Enzymes: No results for input(s): CKTOTAL, CKMB, CKMBINDEX, TROPONINI in the last 168 hours. BNP: Invalid input(s): POCBNP CBG: Recent Labs  Lab 05/11/19 0807 05/13/19 0824  GLUCAP 105* 102*   D-Dimer No results for input(s): DDIMER in the last 72 hours. Hgb A1c No results for input(s): HGBA1C in the last 72 hours. Lipid Profile No results for input(s): CHOL, HDL, LDLCALC, TRIG, CHOLHDL, LDLDIRECT in the last 72  hours. Thyroid function studies Recent Labs    05/11/19 0519  TSH 1.919   Anemia work up No results for input(s): VITAMINB12, FOLATE, FERRITIN, TIBC, IRON, RETICCTPCT in the last 72 hours. Microbiology Recent Results (from the past 240 hour(s))  Respiratory Panel by RT PCR (Flu A&B, Covid) - Nasopharyngeal Swab     Status: None   Collection Time: 05/10/19  5:13 PM   Specimen: Nasopharyngeal Swab  Result Value Ref Range Status   SARS Coronavirus 2 by RT PCR NEGATIVE NEGATIVE Final    Comment: (NOTE) SARS-CoV-2 target nucleic acids are NOT DETECTED. The SARS-CoV-2 RNA is generally detectable in upper respiratoy specimens during the acute phase of infection. The lowest concentration of SARS-CoV-2 viral copies this assay can detect is 131 copies/mL. A negative result does not preclude SARS-Cov-2 infection and should not be used as the sole basis for treatment or other patient management decisions. A negative result may occur with  improper specimen collection/handling, submission of specimen other than nasopharyngeal swab, presence of viral mutation(s) within the areas targeted by this assay, and inadequate number of viral copies (<131 copies/mL). A negative result must be combined with clinical observations, patient history, and epidemiological information. The expected result is Negative. Fact Sheet for Patients:  PinkCheek.be Fact Sheet for Healthcare Providers:  GravelBags.it This test is not yet ap proved or cleared by the [place] FDA and  has been authorized for detection and/or diagnosis of SARS-CoV-2 by FDA under an Emergency Use Authorization (EUA). This EUA will remain  in effect (meaning this test can be used) for the duration of the COVID-19 declaration under Section 564(b)(1) of the Act, 21 U.S.C. section 360bbb-3(b)(1), unless the authorization is terminated or revoked sooner.    Influenza A by PCR  NEGATIVE NEGATIVE Final   Influenza B by PCR NEGATIVE NEGATIVE Final    Comment: (NOTE) The Xpert Xpress SARS-CoV-2/FLU/RSV assay is intended as an aid in  the diagnosis of influenza from Nasopharyngeal swab specimens and  should not be used as a sole basis for treatment. Nasal washings and  aspirates are unacceptable for Xpert Xpress SARS-CoV-2/FLU/RSV  testing. Fact Sheet for Patients: PinkCheek.be Fact Sheet for Healthcare Providers: GravelBags.it This test is not yet approved or cleared by the [place] FDA and  has been authorized for detection and/or diagnosis of SARS-CoV-2 by  FDA under an Emergency Use Authorization (EUA). This EUA will remain  in effect (meaning this test can be used) for the duration of the  Covid-19 declaration under Section 564(b)(1) of the Act, 21  U.S.C. section 360bbb-3(b)(1), unless the authorization is  terminated or revoked. Performed at [facility],  [place], [place] [address]   GI pathogen panel by PCR, stool     Status: None   Collection Time: 05/11/19  4:41 AM   Specimen: Stool  Result Value Ref Range Status   Plesiomonas shigelloides NOT DETECTED NOT DETECTED Final   Yersinia enterocolitica NOT DETECTED NOT DETECTED Final   Vibrio NOT DETECTED NOT DETECTED Final   Enteropathogenic E coli NOT DETECTED NOT DETECTED Final   E coli (ETEC) LT/ST NOT DETECTED NOT DETECTED Final   E coli 3154 by PCR Not applicable NOT DETECTED Final   Cryptosporidium by PCR NOT DETECTED NOT DETECTED Final   Entamoeba histolytica NOT DETECTED NOT DETECTED Final   Adenovirus F 40/41 NOT DETECTED NOT DETECTED Final   Norovirus GI/GII NOT DETECTED NOT DETECTED Final   Sapovirus NOT DETECTED NOT DETECTED Final    Comment: (NOTE) Performed At: [facility] [place], [place] [id number] Rush Farmer MD KD:[id number]    Vibrio cholerae NOT DETECTED NOT  DETECTED Final   Campylobacter by PCR NOT DETECTED NOT DETECTED Final   Salmonella by PCR NOT DETECTED NOT DETECTED Final   E coli (STEC) NOT DETECTED NOT DETECTED Final   Enteroaggregative E coli NOT DETECTED NOT DETECTED Final   Shigella by PCR NOT DETECTED NOT DETECTED Final   Cyclospora cayetanensis NOT DETECTED NOT DETECTED Final   Astrovirus NOT DETECTED NOT DETECTED Final   G lamblia by PCR NOT DETECTED NOT DETECTED Final   Rotavirus A by PCR NOT DETECTED NOT DETECTED Final  C difficile quick scan w PCR reflex     Status: None   Collection Time: 05/11/19  4:41 AM   Specimen: STOOL  Result Value Ref Range Status   C Diff antigen NEGATIVE NEGATIVE Final   C Diff toxin NEGATIVE NEGATIVE Final   C Diff interpretation No C. difficile detected.  Final    Comment: Performed at [facility], [address] [place], [place] [address]     Discharge Instructions:   Discharge Instructions    Diet - low sodium heart healthy   Complete by: As directed    Increase activity slowly   Complete by: As directed      Allergies as of 05/13/2019      Reactions   Evista [raloxifene] Other (See Comments)   Night sweats    Fluticasone-salmeterol Other (See Comments)   Cough, "chokes me"       Medication List    TAKE these medications   albuterol 108 (90 Base) MCG/ACT inhaler Commonly known as: VENTOLIN HFA Inhale 2 puffs into the lungs every 6 (six) hours as needed for wheezing or shortness of breath.   amLODipine 5 MG tablet Commonly known as: NORVASC TAKE 1 TABLET BY MOUTH  TWICE DAILY   aspirin EC 81 MG tablet Take 1 tablet (81 mg total) by mouth daily.   cholecalciferol 1000 units tablet Commonly known as: VITAMIN D Take 1,000 Units by mouth daily.   clopidogrel 75 MG tablet Commonly known as: PLAVIX Take 1 tablet (75 mg total) by mouth daily.   fluticasone 50 MCG/ACT nasal spray Commonly known as: FLONASE USE 2 SPRAYS INTO BOTH  NOSTRILS DAILY AS NEEDED  FOR  ALLERGIES. What changed: See the new instructions.   hydrALAZINE 25 MG tablet Commonly known as: APRESOLINE TAKE 1 TABLET BY MOUTH 3  TIMES DAILY   ipratropium-albuterol 0.5-2.5 (3) MG/3ML Soln Commonly known as: DUONEB Take 3 mLs by nebulization every 6 (six) hours as  needed (for shortness of breath).   losartan 100 MG tablet Commonly known as: COZAAR TAKE 1 TABLET BY MOUTH ONCE DAILY What changed:   how much to take  how to take this  when to take this   lovastatin 40 MG tablet Commonly known as: MEVACOR Take 40 mg by mouth daily.   metoprolol tartrate 25 MG tablet Commonly known as: LOPRESSOR TAKE 1 TABLET BY MOUTH  TWICE DAILY   OVER THE COUNTER MEDICATION daily.   potassium chloride 10 MEQ tablet Commonly known as: KLOR-CON TAKE 1 TABLET BY MOUTH  DAILY   Symbicort 160-4.5 MCG/ACT inhaler Generic drug: budesonide-formoterol USE 2 PUFFS TWO TIMES DAILY   torsemide 20 MG tablet Commonly known as: DEMADEX Take 20 mg by mouth every morning.         Time coordinating discharge: 28 minutes  Signed:   [place]  [facility] 05/13/2019, 4:38 PM

## 2019-05-13 NOTE — Progress Notes (Signed)
Patient discharged home per MD order. All discharge instructions given and all questions answered. 

## 2019-05-14 ENCOUNTER — Other Ambulatory Visit: Payer: Self-pay | Admitting: *Deleted

## 2019-05-14 ENCOUNTER — Telehealth: Payer: Self-pay

## 2019-05-14 DIAGNOSIS — C679 Malignant neoplasm of bladder, unspecified: Secondary | ICD-10-CM

## 2019-05-14 NOTE — Telephone Encounter (Signed)
Transition Care Management Follow-up Telephone Call   Date discharged? 05/13/19   How have you been since you were released from the hospital? Patient states," I am still a little weak in the legs but I am eating/drinking good and working on building my strength slowly." Denies nausea, vomiting, diarrhea, pain.    Do you understand why you were in the hospital? Yes.   Do you understand the discharge instructions? Yes, increase activity as tolerated, build strength up slowly. Stay hydrated. Follow up with pcp in 1 week.    Where were you discharged to? Home.   Items Reviewed:  Medications reviewed: Yes, taking scheduled medications as directed.   Allergies reviewed: Yes, none new.   Dietary changes reviewed: Yes, low sodium/heart healthy.   Referrals reviewed: Yes.     Functional Questionnaire:   Activities of Daily Living (ADLs):   She states they are independent in the following: Independent in all ADLs.  States they require assistance with the following: Ambulates with walker as needed.    Any transportation issues/concerns?: None at this time.    Any patient concerns? Patient states she was not given fluid pill in hospital due to her kidney function. Now that she is home notices feet are swelling today. Denies pitting when asked to press on the area. Taking torsemide 20mg  as directed and keeping feet elevated.     Confirmed importance and date/time of follow-up visits scheduled Yes,   Provider Appointment booked with Dr. Nicki Reaper, pcp.   Confirmed with patient if condition begins to worsen call PCP or go to the ER.  Patient was given the office number and encouraged to call back with question or concerns.  : Yes.

## 2019-05-15 ENCOUNTER — Encounter: Payer: Self-pay | Admitting: Oncology

## 2019-05-15 ENCOUNTER — Inpatient Hospital Stay: Payer: Medicare Other

## 2019-05-15 ENCOUNTER — Other Ambulatory Visit: Payer: Self-pay

## 2019-05-15 ENCOUNTER — Inpatient Hospital Stay: Payer: Medicare Other | Attending: Oncology | Admitting: Oncology

## 2019-05-15 VITALS — BP 167/57 | HR 75 | Temp 99.1°F | Wt 149.0 lb

## 2019-05-15 DIAGNOSIS — K449 Diaphragmatic hernia without obstruction or gangrene: Secondary | ICD-10-CM | POA: Insufficient documentation

## 2019-05-15 DIAGNOSIS — Z8719 Personal history of other diseases of the digestive system: Secondary | ICD-10-CM | POA: Diagnosis not present

## 2019-05-15 DIAGNOSIS — Z90721 Acquired absence of ovaries, unilateral: Secondary | ICD-10-CM | POA: Insufficient documentation

## 2019-05-15 DIAGNOSIS — C679 Malignant neoplasm of bladder, unspecified: Secondary | ICD-10-CM | POA: Diagnosis present

## 2019-05-15 DIAGNOSIS — Z8249 Family history of ischemic heart disease and other diseases of the circulatory system: Secondary | ICD-10-CM | POA: Insufficient documentation

## 2019-05-15 DIAGNOSIS — K5730 Diverticulosis of large intestine without perforation or abscess without bleeding: Secondary | ICD-10-CM | POA: Insufficient documentation

## 2019-05-15 DIAGNOSIS — Z85828 Personal history of other malignant neoplasm of skin: Secondary | ICD-10-CM | POA: Diagnosis not present

## 2019-05-15 DIAGNOSIS — Z9221 Personal history of antineoplastic chemotherapy: Secondary | ICD-10-CM | POA: Diagnosis not present

## 2019-05-15 DIAGNOSIS — Z7982 Long term (current) use of aspirin: Secondary | ICD-10-CM | POA: Insufficient documentation

## 2019-05-15 DIAGNOSIS — I700 Atherosclerosis of aorta: Secondary | ICD-10-CM | POA: Insufficient documentation

## 2019-05-15 DIAGNOSIS — K760 Fatty (change of) liver, not elsewhere classified: Secondary | ICD-10-CM | POA: Insufficient documentation

## 2019-05-15 DIAGNOSIS — Z800 Family history of malignant neoplasm of digestive organs: Secondary | ICD-10-CM | POA: Insufficient documentation

## 2019-05-15 DIAGNOSIS — E876 Hypokalemia: Secondary | ICD-10-CM | POA: Diagnosis not present

## 2019-05-15 DIAGNOSIS — Z79899 Other long term (current) drug therapy: Secondary | ICD-10-CM | POA: Insufficient documentation

## 2019-05-15 DIAGNOSIS — K259 Gastric ulcer, unspecified as acute or chronic, without hemorrhage or perforation: Secondary | ICD-10-CM | POA: Insufficient documentation

## 2019-05-15 DIAGNOSIS — R634 Abnormal weight loss: Secondary | ICD-10-CM | POA: Diagnosis not present

## 2019-05-15 DIAGNOSIS — I110 Hypertensive heart disease with heart failure: Secondary | ICD-10-CM | POA: Diagnosis not present

## 2019-05-15 DIAGNOSIS — J449 Chronic obstructive pulmonary disease, unspecified: Secondary | ICD-10-CM | POA: Insufficient documentation

## 2019-05-15 DIAGNOSIS — I2510 Atherosclerotic heart disease of native coronary artery without angina pectoris: Secondary | ICD-10-CM | POA: Insufficient documentation

## 2019-05-15 DIAGNOSIS — D751 Secondary polycythemia: Secondary | ICD-10-CM | POA: Diagnosis not present

## 2019-05-15 DIAGNOSIS — N179 Acute kidney failure, unspecified: Secondary | ICD-10-CM | POA: Diagnosis not present

## 2019-05-15 DIAGNOSIS — Z7902 Long term (current) use of antithrombotics/antiplatelets: Secondary | ICD-10-CM | POA: Diagnosis not present

## 2019-05-15 DIAGNOSIS — E7800 Pure hypercholesterolemia, unspecified: Secondary | ICD-10-CM | POA: Diagnosis not present

## 2019-05-15 DIAGNOSIS — Z87891 Personal history of nicotine dependence: Secondary | ICD-10-CM | POA: Insufficient documentation

## 2019-05-15 DIAGNOSIS — Z8041 Family history of malignant neoplasm of ovary: Secondary | ICD-10-CM | POA: Insufficient documentation

## 2019-05-15 DIAGNOSIS — Z8379 Family history of other diseases of the digestive system: Secondary | ICD-10-CM | POA: Insufficient documentation

## 2019-05-15 DIAGNOSIS — Z803 Family history of malignant neoplasm of breast: Secondary | ICD-10-CM | POA: Insufficient documentation

## 2019-05-15 DIAGNOSIS — R5383 Other fatigue: Secondary | ICD-10-CM | POA: Diagnosis not present

## 2019-05-15 DIAGNOSIS — Z8262 Family history of osteoporosis: Secondary | ICD-10-CM | POA: Diagnosis not present

## 2019-05-15 DIAGNOSIS — Z811 Family history of alcohol abuse and dependence: Secondary | ICD-10-CM | POA: Insufficient documentation

## 2019-05-15 DIAGNOSIS — Z8261 Family history of arthritis: Secondary | ICD-10-CM | POA: Insufficient documentation

## 2019-05-15 NOTE — Progress Notes (Signed)
Patient stated that she has had lower extremity edema and loose stools since yesterday. Patient stated that she had been in the hospital due to having diarrhea but cleared before she left.

## 2019-05-19 ENCOUNTER — Other Ambulatory Visit: Payer: Self-pay

## 2019-05-19 ENCOUNTER — Encounter: Payer: Self-pay | Admitting: *Deleted

## 2019-05-19 ENCOUNTER — Other Ambulatory Visit: Payer: Self-pay | Admitting: *Deleted

## 2019-05-19 ENCOUNTER — Ambulatory Visit (INDEPENDENT_AMBULATORY_CARE_PROVIDER_SITE_OTHER): Payer: Medicare Other | Admitting: Internal Medicine

## 2019-05-19 DIAGNOSIS — N179 Acute kidney failure, unspecified: Secondary | ICD-10-CM

## 2019-05-19 DIAGNOSIS — I700 Atherosclerosis of aorta: Secondary | ICD-10-CM

## 2019-05-19 DIAGNOSIS — D45 Polycythemia vera: Secondary | ICD-10-CM

## 2019-05-19 DIAGNOSIS — N183 Chronic kidney disease, stage 3 unspecified: Secondary | ICD-10-CM

## 2019-05-19 DIAGNOSIS — I5032 Chronic diastolic (congestive) heart failure: Secondary | ICD-10-CM | POA: Diagnosis not present

## 2019-05-19 DIAGNOSIS — I10 Essential (primary) hypertension: Secondary | ICD-10-CM | POA: Diagnosis not present

## 2019-05-19 DIAGNOSIS — E876 Hypokalemia: Secondary | ICD-10-CM | POA: Diagnosis not present

## 2019-05-19 DIAGNOSIS — R197 Diarrhea, unspecified: Secondary | ICD-10-CM | POA: Diagnosis not present

## 2019-05-19 DIAGNOSIS — R739 Hyperglycemia, unspecified: Secondary | ICD-10-CM | POA: Diagnosis not present

## 2019-05-19 DIAGNOSIS — E7800 Pure hypercholesterolemia, unspecified: Secondary | ICD-10-CM

## 2019-05-19 DIAGNOSIS — R0609 Other forms of dyspnea: Secondary | ICD-10-CM

## 2019-05-19 DIAGNOSIS — K551 Chronic vascular disorders of intestine: Secondary | ICD-10-CM

## 2019-05-19 DIAGNOSIS — R0600 Dyspnea, unspecified: Secondary | ICD-10-CM

## 2019-05-19 DIAGNOSIS — J410 Simple chronic bronchitis: Secondary | ICD-10-CM

## 2019-05-19 MED ORDER — PREDNISONE 10 MG PO TABS: ORAL_TABLET | ORAL | 0 refills | Status: DC

## 2019-05-19 NOTE — Progress Notes (Signed)
[name], female   DOB: 1935/04/26, 85 y.o.   MRN: [id number]   Virtual Visit via video Note  This visit type was conducted due to national recommendations for restrictions regarding the COVID-19 pandemic (e.g. social distancing).  This format is felt to be most appropriate for this patient at this time.  All issues noted in this document were discussed and addressed.  No physical exam was performed (except for noted visual exam findings with Video Visits).   I connected with Casimer Leek by a video enabled telemedicine application or telephone and verified that I am speaking with the correct person using two identifiers. Location patient: home Location provider: work or home office Persons participating in the virtual visit: patient, provider  I discussed the limitations, risks, security and privacy concerns of performing an evaluation and management service by video and the availability of in person appointments. The patient expressed understanding and agreed to proceed.   Reason for visit: hospital follow up.   HPI: Admitted 05/10/19 - 05/13/19 with diarrhea.  She reports diarrhea started on 04/22/19 - after ingestion of contrast for CT abdomen.  Admitted for diarrhea complicated by hypokalemia and acute kidney injury.  Treated with IV fluids.  Hypokalemia and hypophosphatemia - treated.  C.diff negative.  No clear etiology of her diarrhea found.  Discharged home.  Furosemide was stopped while she was in the hospital.  Weight increased after discharge - 149 pounds per her report.  She restarted her lasix and weight is now 137 pounds.  She is still noticing loose stool.  States after she eats - she has to go to the bathroom.  May occur 4-6x/day.  Some soft stool and light diarrhea.  Took imodium this am. No acid reflux.  No persistent abdominal pain.  No blood in stool.  She does report having some increased cough and coughing fits.  No fever.  Using nebulizer. Using symbicort bid.   Increased cough and some increased sob with going up her stairs.  Feels is similar to her pulmonary flares.  Feels needs course of prednisone.  Is feeling better than when went in to the hospital.  Still some weakness.  Discussed physical therapy.  She declines.     ROS: See pertinent positives and negatives per HPI.  Past Medical History:  Diagnosis Date  . Anemia   . Asthma   . Bladder cancer ([place])   . BRCA positive   . CAD (coronary artery disease)   . CHF (congestive heart failure) ([place])   . COPD (chronic obstructive pulmonary disease) ([place])   . Emphysema lung ([place])   . GERD (gastroesophageal reflux disease)   . History of colon polyps   . Hypercholesterolemia   . Hyperglycemia   . Hypertension   . Lung nodules   . Osteoporosis   . Personal history of tobacco use, presenting hazards to health 11/25/2014  . Polycythemia vera(238.4)   . Renal cyst   . Skin cancer     Past Surgical History:  Procedure Laterality Date  . ANGIOPLASTY     coronary (x1)  . COLONOSCOPY WITH PROPOFOL N/A 03/02/2015   Procedure: COLONOSCOPY WITH PROPOFOL;  Surgeon: Lollie Sails, MD;  Location: [facility] ENDOSCOPY;  Service: Endoscopy;  Laterality: N/A;  . COLONOSCOPY WITH PROPOFOL N/A 02/13/2017   Procedure: COLONOSCOPY WITH PROPOFOL;  Surgeon: [name], MD;  Location: [facility] ENDOSCOPY;  Service: Endoscopy;  Laterality: N/A;  . CORONARY ANGIOPLASTY    . CORONARY ARTERY BYPASS GRAFT  09/17/2017  pt denies  . CYSTOSCOPY W/ RETROGRADES Bilateral 02/13/2017   Procedure: CYSTOSCOPY WITH RETROGRADE PYELOGRAM;  Surgeon: Hollice Espy, MD;  Location: [facility] ORS;  Service: Urology;  Laterality: Bilateral;  . CYSTOSCOPY W/ RETROGRADES Bilateral 09/19/2017   Procedure: CYSTOSCOPY WITH RETROGRADE PYELOGRAM;  Surgeon: Hollice Espy, MD;  Location: [facility] ORS;  Service: Urology;  Laterality: Bilateral;  . CYSTOSCOPY WITH BIOPSY  03/26/2017   Procedure: CYSTOSCOPY WITH BIOPSY;  Surgeon: Hollice Espy, MD;   Location: [facility] ORS;  Service: Urology;;  . Consuela Mimes WITH STENT PLACEMENT Bilateral 02/13/2017   Procedure: CYSTOSCOPY WITH STENT PLACEMENT;  Surgeon: Hollice Espy, MD;  Location: [facility] ORS;  Service: Urology;  Laterality: Bilateral;  . CYSTOSCOPY WITH STENT PLACEMENT Bilateral 03/26/2017   Procedure: CYSTOSCOPY WITH STENT EXCHANGE;  Surgeon: Hollice Espy, MD;  Location: [facility] ORS;  Service: Urology;  Laterality: Bilateral;  . ESOPHAGOGASTRODUODENOSCOPY (EGD) WITH PROPOFOL N/A 02/13/2017   Procedure: ESOPHAGOGASTRODUODENOSCOPY (EGD) WITH PROPOFOL;  Surgeon: [name], MD;  Location[facility] ENDOSCOPY;  Service: Endoscopy;  Laterality: N/A;  . PORTA CATH INSERTION N/A 02/28/2017   Procedure: PORTA CATH INSERTION;  Surgeon: Algernon Huxley, MD;  Location: [place] CV LAB;  Service: Cardiovascular;  Laterality: N/A;  . PORTA CATH REMOVAL N/A 12/24/2017   Procedure: PORTA CATH REMOVAL;  Surgeon: Algernon Huxley, MD;  Location: [place] CV LAB;  Service: Cardiovascular;  Laterality: N/A;  . SALPINGOOPHORECTOMY    . TONSILECTOMY/ADENOIDECTOMY WITH MYRINGOTOMY    . TONSILLECTOMY    . TRANSURETHRAL RESECTION OF BLADDER TUMOR N/A 02/13/2017   Procedure: TRANSURETHRAL RESECTION OF BLADDER TUMOR (TURBT);  Surgeon: Hollice Espy, MD;  Location: [facility] ORS;  Service: Urology;  Laterality: N/A;  . TUBAL LIGATION    . UPPER GI ENDOSCOPY  02/13/2017  . URETEROSCOPY Left 09/19/2017   Procedure: URETEROSCOPY;  Surgeon: Hollice Espy, MD;  Location: [facility] ORS;  Service: Urology;  Laterality: Left;  Marland Kitchen VISCERAL ANGIOGRAPHY N/A 01/23/2019   Procedure: VISCERAL ANGIOGRAPHY;  Surgeon: Algernon Huxley, MD;  Location: [place] CV LAB;  Service: Cardiovascular;  Laterality: N/A;  . VISCERAL ARTERY INTERVENTION N/A 02/12/2017   Procedure: VISCERAL ARTERY INTERVENTION;  Surgeon: Algernon Huxley, MD;  Location: [place] CV LAB;  Service: Cardiovascular;  Laterality: N/A;    Family History  Problem Relation Age of  Onset  . Cirrhosis Father        died age 2  . Alcohol abuse Father   . Asthma Mother   . Congestive Heart Failure Mother   . Breast cancer Mother        2 (1/2 sisters)  . Osteoarthritis Mother   . Colon cancer Mother   . Lupus Sister   . Alcohol abuse Sister   . Ovarian cancer Sister   . Osteoporosis Sister   . Skin cancer Sister   . Breast cancer Cousin   . Breast cancer Maternal Aunt     SOCIAL HX: reviewed.     Current Outpatient Medications:  .  albuterol (PROVENTIL HFA;VENTOLIN HFA) 108 (90 Base) MCG/ACT inhaler, Inhale 2 puffs into the lungs every 6 (six) hours as needed for wheezing or shortness of breath., Disp: 1 Inhaler, Rfl: 2 .  amLODipine (NORVASC) 5 MG tablet, TAKE 1 TABLET BY MOUTH  TWICE DAILY (Patient taking differently: Take 5 mg by mouth 2 (two) times daily. ), Disp: 180 tablet, Rfl: 3 .  aspirin EC 81 MG tablet, Take 1 tablet (81 mg total) by mouth daily., Disp: 150 tablet, Rfl: 2 .  cholecalciferol (VITAMIN D) 1000 units tablet, Take 1,000 Units by mouth daily., Disp: , Rfl:  .  clopidogrel (PLAVIX) 75 MG tablet, Take 1 tablet (75 mg total) by mouth daily., Disp: 30 tablet, Rfl: 0 .  fluticasone (FLONASE) 50 MCG/ACT nasal spray, USE 2 SPRAYS INTO BOTH  NOSTRILS DAILY AS NEEDED  FOR ALLERGIES. (Patient taking differently: Place 2 sprays into both nostrils daily. ), Disp: 48 g, Rfl: 1 .  hydrALAZINE (APRESOLINE) 25 MG tablet, TAKE 1 TABLET BY MOUTH 3  TIMES DAILY (Patient taking differently: Take 25 mg by mouth 3 (three) times daily. ), Disp: 270 tablet, Rfl: 3 .  ipratropium-albuterol (DUONEB) 0.5-2.5 (3) MG/3ML SOLN, Take 3 mLs by nebulization every 6 (six) hours as needed (for shortness of breath)., Disp: 36 mL, Rfl: 0 .  losartan (COZAAR) 100 MG tablet, TAKE 1 TABLET BY MOUTH ONCE DAILY (Patient taking differently: Take 100 mg by mouth daily. TAKE 1 TABLET BY MOUTH ONCE DAILY), Disp: 90 tablet, Rfl: 3 .  lovastatin (MEVACOR) 40 MG tablet, Take 40 mg by mouth  daily., Disp: , Rfl:  .  metoprolol tartrate (LOPRESSOR) 25 MG tablet, TAKE 1 TABLET BY MOUTH  TWICE DAILY (Patient taking differently: Take 25 mg by mouth 2 (two) times daily. ), Disp: 180 tablet, Rfl: 3 .  OVER THE COUNTER MEDICATION, daily., Disp: , Rfl:  .  potassium chloride (K-DUR) 10 MEQ tablet, TAKE 1 TABLET BY MOUTH  DAILY (Patient taking differently: Take 10 mEq by mouth daily. ), Disp: 90 tablet, Rfl: 1 .  predniSONE (DELTASONE) 10 MG tablet, Take 6 tablets x 1 day and then decrease by 1/2 tablet per day until down to zero mg., Disp: 39 tablet, Rfl: 0 .  SYMBICORT 160-4.5 MCG/ACT inhaler, USE 2 PUFFS TWO TIMES DAILY, Disp: 30.6 g, Rfl: 3 .  torsemide (DEMADEX) 20 MG tablet, Take 20 mg by mouth every morning., Disp: , Rfl: 11 No current facility-administered medications for this visit.  Facility-Administered Medications Ordered in Other Visits:  .  heparin lock flush 100 unit/mL, 500 Units, Intravenous, Once, Sindy Guadeloupe, MD .  sodium chloride flush (NS) 0.9 % injection 10 mL, 10 mL, Intravenous, PRN, Sindy Guadeloupe, MD  EXAM:  VITALS per patient if applicable:  270/62  GENERAL: alert, oriented, appears well and in no acute distress  HEENT: atraumatic, conjunttiva clear, no obvious abnormalities on inspection of external nose and ears  NECK: normal movements of the head and neck  LUNGS: on inspection no signs of respiratory distress, breathing rate appears normal, no obvious gross SOB, gasping or wheezing  CV: no obvious cyanosis  PSYCH/NEURO: pleasant and cooperative, no obvious depression or anxiety, speech and thought processing grossly intact  ASSESSMENT AND PLAN:  Discussed the following assessment and plan:  AKI (acute kidney injury) ([place]) Found on admission to hospital.  Hydrated.  Felt to be related to increased diarrhea.  Lasix held.  Back on lasix now.  Still with loose stool.  Eating and drinking.  Follow metabolic panel.    Aortic atherosclerosis  ([place]) Continue on crestor.  Found to have fairly severe calcific stenosis of the aorta around the visceral vessels.  Felt to be a poor candidate for open surgery.    CHF (congestive heart failure) (HCC) Lasix stopped during recent hospitalization.  Weight increased.  She is back on lasix now.  Weight back down.  Feels her current cough,congestion and sob related to underlying pulmonary etiology.  Follow metabolic panel.   CKD (chronic  kidney disease), stage III ([place]) Has been followed by nephrology.  Recent worsening with increased diarrhea.  Treated with IVFs.  Avoid antiinflammatories.  Follow metabolic panel.    COPD (chronic obstructive pulmonary disease) ([place]) Has been followed by Dr Raul Del.  Recent increased cough and DOE.  Continue nebs and symbicort as outlined.  Prednisone taper as directed.  Follow.    Diarrhea Recent admission as outlined.  C.diff negative.  Started after oral contrast.  Unclear etiology.  Refer to GI.  Discussed taking a probiotic.    Essential hypertension Follow pressures.  Back on lasix.  Follow metabolic panel.    Hypercholesterolemia On crestor.  Follow lipid panel and liver function tests.    Hyperglycemia Follow met b and a1c.   Hypokalemia Replaced while in hospital.  Follow potassium.    Mesenteric ischemia due to arterial insufficiency (HCC) Continue risk factor modification.  Has seen vascular as outlined.    Polycythemia vera ([place]) Followed by hematology.    DOE (dyspnea on exertion) Reports feeling some sob with exertion.  Treat acute flare with prednisone as outlined.  Was questioning if needed oxygen with ambulation.  Has known COPD. Followed by Dr Raul Del.  See if home health can walk to qualify for oxygen.     Meds ordered this encounter  Medications  . predniSONE (DELTASONE) 10 MG tablet    Sig: Take 6 tablets x 1 day and then decrease by 1/2 tablet per day until down to zero mg.    Dispense:  39 tablet    Refill:  0     I  discussed the assessment and treatment plan with the patient. The patient was provided an opportunity to ask questions and all were answered. The patient agreed with the plan and demonstrated an understanding of the instructions.   The patient was advised to call back or seek an in-person evaluation if the symptoms worsen or if the condition fails to improve as anticipated.   Einar Pheasant, MD

## 2019-05-19 NOTE — Patient Outreach (Signed)
[facility]) Care Management [facility] CM Telephone Outreach, [place] Discharge PCP office completes Transition of Care follow up post-hospital discharge Post-hospital discharge day # 6  05/19/2019  [name] [birth year] [phone]  EMMI Red Alert- General Discharge EMMI Call date/ day:  Friday May 16, 2019/ day # 1 EMMI Red Alert reasons:   Has not read discharge papers/ does not know who to call for concerns or changes/ has questions and concerns  Successful telephone outreach to Tina [name], 85 y/o female, referred this morning to [facility] CM after EMMI Red-Alert for General discharge; patient had recent hospitalization December 26-29, 2020 for diarrhea, AKI- dehydration, and hypokalemia.  Patient was discharged home to self-care without home health services in place.  Patient has history including, but not limited to, CAD; COPD; CKD-III; HTN/ HLD; and bladder cancer- now in remission.  HIPAA/ identity verified with patient and purpose of call discussed with patient; patient remembers receiving automated phone call post-hospital discharge and reports that "everything has worked out nowToys ''[employer]'' Us that she attended PCP office visit today and has already picked up post-office visit medications from her pharmacy.  Reports that she self-manages medications and denies concerns around current medications, stating that she "just reviewed them all" with PCP this afternoon during office visit.  She denies pain/ new/ recent falls, and sounds to be in no distress throughout phone call today.  Patient agrees to screening and she describes herself as completely independent with all ADL's and iADL's; SDOH completed for depression/ transportation/ and food insecurity and no concerns were identified.  Patient declines ongoing care coordination needs; informed her to be looking out for letter and to contact [facility] CM services as indicated in the future, should needs arise.   She is agreeable to this.  Plan:  Will close [facility] EMMI Case, as no needs identified during screening phone call today and patient declines ongoing [facility] CM services; will place successful outreach patient letter in mail to patient should needs arise in the future and will make patient's PCP aware that patient has declined [facility] CM services.  Oneta Rack, RN, BSN, [employer] [facility] Care Management  [phone]

## 2019-05-19 NOTE — Progress Notes (Addendum)
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 She has had jak 2 testing as well as EPO levels in the past which was unremarkable. She gets a phlebotomy when her hematocrit is more than 50. She was last seen by me in June 2018. She also has a history of lung nodules and was getting low-dose lung cancer screening scans on 07/04/2014 when her insurance stopped paying.   She presented to the hospital in October 2018 with symptoms of nausea and diarrhea weight loss and fatigue. She was noted to have irregular wall thickening in the ascending colon suspicious for colon cancer as well as 4.2 x 3.2 cm posterior bladder wall mass that was incidentally noted. She was also seen by vascular surgery and underwent abdominal angiogram with stent placement at the [facility]. She is on aspirin and Plavix for the same. Patient underwent colonoscopy for concerns of ischemic colitis which showed irregular thickening of the descending colon consistent with ischemic colitis. EGD showed a small hiatal hernia. Gastric ulcers were noted which was treated with argon photocoagulation.  With regards to the posterior bladder wall mass she was seen by Dr. Erlene Quan and underwent TURBT with bilateral ureteral stent placement on 02/13/2017. Pathology showed small cell carcinoma with muscularis propria invasion. 4 cycles of carbo/etoposide completed  on 12/261/8. She completed RT following that as she was not deemed to be a surgical candidate. She remains in remission  Incidentally found to have PE on scans done in jan 2019.  She completed 6 months of anticoagulation and stopped  Interval history-patient had CT scan on 04/22/2019 and states that following contrast injection patient had significant diarrhea and was admitted to the hospital for a week for acute kidney injury.  C. difficile was negative.  Her diarrhea got better but she is concerned that it may be returning back over the last 1 day when she has had soft mushy stools but no frank diarrhea.  Patient was also severely hypokalemic and required IV potassium replacements while she was in the hospital.  ECOG PS- 1 Pain scale- 0   Review of systems- Review of Systems  Constitutional: Positive for malaise/fatigue. Negative for chills, fever and weight loss.  HENT: Negative for congestion, ear discharge and nosebleeds.   Eyes: Negative for blurred vision.  Respiratory: Negative for cough, hemoptysis, sputum production, shortness of breath and wheezing.   Cardiovascular: Negative for chest pain, palpitations, orthopnea and claudication.  Gastrointestinal: Negative for abdominal pain, blood in stool, constipation, diarrhea, heartburn, melena, nausea and vomiting.  Genitourinary: Negative for dysuria, flank pain, frequency, hematuria and urgency.  Musculoskeletal: Negative for back pain, joint pain and myalgias.  Skin: Negative for rash.  Neurological: Negative for dizziness, tingling, focal weakness, seizures, weakness and headaches.  Endo/Heme/Allergies: Does not bruise/bleed easily.  Psychiatric/Behavioral: Negative for depression and suicidal ideas. The patient does not have insomnia.        Allergies  Allergen Reactions  . Evista [Raloxifene] Other (See Comments)  Night sweats   . Fluticasone-Salmeterol Other (See Comments)    Cough, "chokes me"      Past Medical  History:  Diagnosis Date  . Anemia   . Asthma   . Bladder cancer ([place])   . BRCA positive   . CAD (coronary artery disease)   . CHF (congestive heart failure) ([place])   . COPD (chronic obstructive pulmonary disease) ([place])   . Emphysema lung ([place])   . GERD (gastroesophageal reflux disease)   . History of colon polyps   . Hypercholesterolemia   . Hyperglycemia   . Hypertension   . Lung nodules   . Osteoporosis   . Personal history of tobacco use, presenting hazards to health 11/25/2014  . Polycythemia vera(238.4)   . Renal cyst   . Skin cancer      Past Surgical History:  Procedure Laterality Date  . ANGIOPLASTY     coronary (x1)  . COLONOSCOPY WITH PROPOFOL N/A 03/02/2015   Procedure: COLONOSCOPY WITH PROPOFOL;  Surgeon: Lollie Sails, MD;  Location: [facility] ENDOSCOPY;  Service: Endoscopy;  Laterality: N/A;  . COLONOSCOPY WITH PROPOFOL N/A 02/13/2017   Procedure: COLONOSCOPY WITH PROPOFOL;  Surgeon: [name], MD;  Location: [facility] ENDOSCOPY;  Service: Endoscopy;  Laterality: N/A;  . CORONARY ANGIOPLASTY    . CORONARY ARTERY BYPASS GRAFT  09/17/2017   pt denies  . CYSTOSCOPY W/ RETROGRADES Bilateral 02/13/2017   Procedure: CYSTOSCOPY WITH RETROGRADE PYELOGRAM;  Surgeon: Hollice Espy, MD;  Location: [facility] ORS;  Service: Urology;  Laterality: Bilateral;  . CYSTOSCOPY W/ RETROGRADES Bilateral 09/19/2017   Procedure: CYSTOSCOPY WITH RETROGRADE PYELOGRAM;  Surgeon: Hollice Espy, MD;  Location: [facility] ORS;  Service: Urology;  Laterality: Bilateral;  . CYSTOSCOPY WITH BIOPSY  03/26/2017   Procedure: CYSTOSCOPY WITH BIOPSY;  Surgeon: Hollice Espy, MD;  Location: [facility] ORS;  Service: Urology;;  . Consuela Mimes WITH STENT PLACEMENT Bilateral 02/13/2017   Procedure: CYSTOSCOPY WITH STENT PLACEMENT;  Surgeon: Hollice Espy, MD;  Location: [facility] ORS;  Service: Urology;  Laterality: Bilateral;  . CYSTOSCOPY WITH STENT PLACEMENT Bilateral 03/26/2017   Procedure: CYSTOSCOPY WITH STENT  EXCHANGE;  Surgeon: Hollice Espy, MD;  Location: [facility] ORS;  Service: Urology;  Laterality: Bilateral;  . ESOPHAGOGASTRODUODENOSCOPY (EGD) WITH PROPOFOL N/A 02/13/2017   Procedure: ESOPHAGOGASTRODUODENOSCOPY (EGD) WITH PROPOFOL;  Surgeon: [name], MD;  Location: [facility] ENDOSCOPY;  Service: Endoscopy;  Laterality: N/A;  . PORTA CATH INSERTION N/A 02/28/2017   Procedure: PORTA CATH INSERTION;  Surgeon: Algernon Huxley, MD;  Location: [place] CV LAB;  Service: Cardiovascular;  Laterality: N/A;  . PORTA CATH REMOVAL N/A 12/24/2017   Procedure: PORTA CATH REMOVAL;  Surgeon: Algernon Huxley, MD;  Location: [place] CV LAB;  Service: Cardiovascular;  Laterality: N/A;  . SALPINGOOPHORECTOMY    . TONSILECTOMY/ADENOIDECTOMY WITH MYRINGOTOMY    . TONSILLECTOMY    . TRANSURETHRAL RESECTION OF BLADDER TUMOR N/A 02/13/2017   Procedure: TRANSURETHRAL RESECTION OF BLADDER TUMOR (TURBT);  Surgeon: Hollice Espy, MD;  Location: [facility] ORS;  Service: Urology;  Laterality: N/A;  . TUBAL LIGATION    . UPPER GI ENDOSCOPY  02/13/2017  . URETEROSCOPY Left 09/19/2017   Procedure: URETEROSCOPY;  Surgeon: Hollice Espy, MD;  Location: [facility] ORS;  Service: Urology;  Laterality: Left;  Marland Kitchen VISCERAL ANGIOGRAPHY N/A 01/23/2019   Procedure: VISCERAL ANGIOGRAPHY;  Surgeon: Algernon Huxley, MD;  Location: [place] [facility] LAB;  Service: Cardiovascular;  Laterality: N/A;  . VISCERAL ARTERY INTERVENTION N/A 02/12/2017   Procedure: VISCERAL ARTERY INTERVENTION;  Surgeon:  Algernon Huxley, MD;  Location: [place] CV LAB;  Service: Cardiovascular;  Laterality: N/A;    Social History   Socioeconomic History  . Marital status: Widowed    Spouse name: Not on file  . Number of children: 2  . Years of education: Not on file  . Highest education level: Not on file  Occupational History  . Not on file  Tobacco Use  . Smoking status: Former Smoker    Packs/day: 1.00    Years: 45.00    Pack years: 45.00    Types: Cigarettes     Quit date: 11/11/2003    Years since quitting: 15.5  . Smokeless tobacco: Never Used  Substance and Sexual Activity  . Alcohol use: Not Currently    Alcohol/week: 0.0 standard drinks  . Drug use: No  . Sexual activity: Never  Other Topics Concern  . Not on file  Social History Narrative  . Not on file   Social Determinants of Health   Financial Resource Strain:   . Difficulty of Paying Living Expenses: Not on file  Food Insecurity:   . Worried About Charity fundraiser in the Last Year: Not on file  . Ran Out of Food in the Last Year: Not on file  Transportation Needs:   . Lack of Transportation (Medical): Not on file  . Lack of Transportation (Non-Medical): Not on file  Physical Activity:   . Days of Exercise per Week: Not asked  . Minutes of Exercise per Session: Not asked  Stress:   . Feeling of Stress : Not on file  Social Connections:   . Frequency of Communication with Friends and Family: Not on file  . Frequency of Social Gatherings with Friends and Family: Not on file  . Attends Religious Services: Not on file  . Active Member of Clubs or Organizations: Not on file  . Attends Archivist Meetings: Not on file  . Marital Status: Not on file  Intimate Partner Violence:   . Fear of Current or Ex-Partner: Not on file  . Emotionally Abused: Not on file  . Physically Abused: Not on file  . Sexually Abused: Not on file    Family History  Problem Relation Age of Onset  . Cirrhosis Father        died age 26  . Alcohol abuse Father   . Asthma Mother   . Congestive Heart Failure Mother   . Breast cancer Mother        2 (1/2 sisters)  . Osteoarthritis Mother   . Colon cancer Mother   . Lupus Sister   . Alcohol abuse Sister   . Ovarian cancer Sister   . Osteoporosis Sister   . Skin cancer Sister   . Breast cancer Cousin   . Breast cancer Maternal Aunt      Current Outpatient Medications:  .  albuterol (PROVENTIL HFA;VENTOLIN HFA) 108 (90 Base)  MCG/ACT inhaler, Inhale 2 puffs into the lungs every 6 (six) hours as needed for wheezing or shortness of breath., Disp: 1 Inhaler, Rfl: 2 .  amLODipine (NORVASC) 5 MG tablet, TAKE 1 TABLET BY MOUTH  TWICE DAILY (Patient taking differently: Take 5 mg by mouth 2 (two) times daily. ), Disp: 180 tablet, Rfl: 3 .  aspirin EC 81 MG tablet, Take 1 tablet (81 mg total) by mouth daily., Disp: 150 tablet, Rfl: 2 .  cholecalciferol (VITAMIN D) 1000 units tablet, Take 1,000 Units by mouth daily., Disp: , Rfl:  .  clopidogrel (PLAVIX) 75 MG tablet, Take 1 tablet (75 mg total) by mouth daily., Disp: 30 tablet, Rfl: 0 .  fluticasone (FLONASE) 50 MCG/ACT nasal spray, USE 2 SPRAYS INTO BOTH  NOSTRILS DAILY AS NEEDED  FOR ALLERGIES. (Patient taking differently: Place 2 sprays into both nostrils daily. ), Disp: 48 g, Rfl: 1 .  hydrALAZINE (APRESOLINE) 25 MG tablet, TAKE 1 TABLET BY MOUTH 3  TIMES DAILY (Patient taking differently: Take 25 mg by mouth 3 (three) times daily. ), Disp: 270 tablet, Rfl: 3 .  ipratropium-albuterol (DUONEB) 0.5-2.5 (3) MG/3ML SOLN, Take 3 mLs by nebulization every 6 (six) hours as needed (for shortness of breath)., Disp: 36 mL, Rfl: 0 .  losartan (COZAAR) 100 MG tablet, TAKE 1 TABLET BY MOUTH ONCE DAILY (Patient taking differently: Take 100 mg by mouth daily. TAKE 1 TABLET BY MOUTH ONCE DAILY), Disp: 90 tablet, Rfl: 3 .  lovastatin (MEVACOR) 40 MG tablet, Take 40 mg by mouth daily., Disp: , Rfl:  .  metoprolol tartrate (LOPRESSOR) 25 MG tablet, TAKE 1 TABLET BY MOUTH  TWICE DAILY (Patient taking differently: Take 25 mg by mouth 2 (two) times daily. ), Disp: 180 tablet, Rfl: 3 .  OVER THE COUNTER MEDICATION, daily., Disp: , Rfl:  .  potassium chloride (K-DUR) 10 MEQ tablet, TAKE 1 TABLET BY MOUTH  DAILY (Patient taking differently: Take 10 mEq by mouth daily. ), Disp: 90 tablet, Rfl: 1 .  SYMBICORT 160-4.5 MCG/ACT inhaler, USE 2 PUFFS TWO TIMES DAILY, Disp: 30.6 g, Rfl: 3 .  torsemide (DEMADEX)  20 MG tablet, Take 20 mg by mouth every morning., Disp: , Rfl: 11 No current facility-administered medications for this visit.  Facility-Administered Medications Ordered in Other Visits:  .  heparin lock flush 100 unit/mL, 500 Units, Intravenous, Once, Randa Evens C, MD .  sodium chloride flush (NS) 0.9 % injection 10 mL, 10 mL, Intravenous, PRN, Sindy Guadeloupe, MD  Physical exam:  Vitals:   05/15/19 1022 05/19/19 1018  BP:  (!) 167/57  Pulse:  75  Temp:  99.1 F (37.3 C)  TempSrc:  Tympanic  Weight: 149 lb (67.6 kg)    Physical Exam Constitutional:      General: She is not in acute distress. HENT:     Head: Normocephalic and atraumatic.  Eyes:     Pupils: Pupils are equal, round, and reactive to light.  Cardiovascular:     Rate and Rhythm: Normal rate and regular rhythm.     Heart sounds: Normal heart sounds.  Pulmonary:     Effort: Pulmonary effort is normal.     Breath sounds: Normal breath sounds.  Abdominal:     General: Bowel sounds are normal.     Palpations: Abdomen is soft.  Musculoskeletal:     Cervical back: Normal range of motion.     Comments: Trace b/l edema  Skin:    General: Skin is warm and dry.  Neurological:     Mental Status: She is alert and oriented to person, place, and time.      CMP Latest Ref Rng & Units 05/13/2019  Glucose 70 - 99 mg/dL -  BUN 8 - 23 mg/dL -  Creatinine 0.44 - 1.00 mg/dL -  Sodium 135 - 145 mmol/L -  Potassium 3.5 - 5.1 mmol/L 3.3(L)  Chloride 98 - 111 mmol/L -  CO2 22 - 32 mmol/L -  Calcium 8.9 - 10.3 mg/dL -  Total Protein 6.5 - 8.1 g/dL -  Total Bilirubin 0.3 -  1.2 mg/dL -  Alkaline Phos 38 - 126 U/L -  AST 15 - 41 U/L -  ALT 0 - 44 U/L -   CBC Latest Ref Rng & Units 05/11/2019  WBC 4.0 - 10.5 K/uL 8.2  Hemoglobin 12.0 - 15.0 g/dL 12.7  Hematocrit 36.0 - 46.0 % 36.1  Platelets 150 - 400 K/uL 195    No images are attached to the encounter.  CT Chest W Contrast  Result Date: 04/22/2019 CLINICAL DATA:   Follow-up bladder cancer, s/p chemo radiation EXAM: CT CHEST, ABDOMEN, AND PELVIS WITH CONTRAST TECHNIQUE: Multidetector CT imaging of the chest, abdomen and pelvis was performed following the standard protocol during bolus administration of intravenous contrast. CONTRAST:  51m OMNIPAQUE IOHEXOL 300 MG/ML SOLN, additional oral enteric contrast COMPARISON:  10/18/2018 FINDINGS: CT CHEST FINDINGS Cardiovascular: Aortic atherosclerosis. Normal heart size. Left coronary artery calcifications and/or stents. No pericardial effusion. Mediastinum/Nodes: No enlarged mediastinal, hilar, or axillary lymph nodes. Thyroid gland, trachea, and esophagus demonstrate no significant findings. Lungs/Pleura: Mild centrilobular emphysema. No pleural effusion or pneumothorax. Musculoskeletal: No chest wall mass or suspicious bone lesions identified. CT ABDOMEN PELVIS FINDINGS Hepatobiliary: No solid liver abnormality is seen. Hepatic steatosis. No gallstones, gallbladder wall thickening, or biliary dilatation. Pancreas: Unremarkable. No pancreatic ductal dilatation or surrounding inflammatory changes. Spleen: Normal in size without significant abnormality. Adrenals/Urinary Tract: Adrenal glands are unremarkable. Multiple bilateral renal cysts. Kidneys are otherwise normal, without renal calculi, solid lesion, or hydronephrosis. Bladder is unremarkable. Stomach/Bowel: Stomach is within normal limits. No evidence of bowel wall thickening, distention, or inflammatory changes. Occasional sigmoid diverticula. Vascular/Lymphatic: Aortic atherosclerosis. There is an unusual, almost entirely occlusive calcific stenosis of the abdominal aorta at the level of the celiac axis and the superior mesenteric artery, status post superior mesenteric artery stenting (series 6, image 97). No enlarged abdominal or pelvic lymph nodes. Reproductive: No mass or other abnormality. Other: No abdominal wall hernia or abnormality. No abdominopelvic ascites.  Musculoskeletal: No acute or significant osseous findings. IMPRESSION: 1. No evidence of metastatic disease to the chest, abdomen or pelvis. 2. Normal appearance of the urinary bladder on non urographic examination. 3.  Emphysema (ICD10-J43.9). 4. Aortic Atherosclerosis (ICD10-I70.0). There is an unusual, almost entirely occlusive calcific stenosis of the abdominal aorta at the level of the celiac axis and the superior mesenteric artery, status post superior mesenteric artery stenting (series 6, image 97). Electronically Signed   By: AEddie CandleM.D.   On: 04/22/2019 12:03   CT ABDOMEN PELVIS W CONTRAST  Result Date: 04/22/2019 CLINICAL DATA:  Follow-up bladder cancer, s/p chemo radiation EXAM: CT CHEST, ABDOMEN, AND PELVIS WITH CONTRAST TECHNIQUE: Multidetector CT imaging of the chest, abdomen and pelvis was performed following the standard protocol during bolus administration of intravenous contrast. CONTRAST:  864mOMNIPAQUE IOHEXOL 300 MG/ML SOLN, additional oral enteric contrast COMPARISON:  10/18/2018 FINDINGS: CT CHEST FINDINGS Cardiovascular: Aortic atherosclerosis. Normal heart size. Left coronary artery calcifications and/or stents. No pericardial effusion. Mediastinum/Nodes: No enlarged mediastinal, hilar, or axillary lymph nodes. Thyroid gland, trachea, and esophagus demonstrate no significant findings. Lungs/Pleura: Mild centrilobular emphysema. No pleural effusion or pneumothorax. Musculoskeletal: No chest wall mass or suspicious bone lesions identified. CT ABDOMEN PELVIS FINDINGS Hepatobiliary: No solid liver abnormality is seen. Hepatic steatosis. No gallstones, gallbladder wall thickening, or biliary dilatation. Pancreas: Unremarkable. No pancreatic ductal dilatation or surrounding inflammatory changes. Spleen: Normal in size without significant abnormality. Adrenals/Urinary Tract: Adrenal glands are unremarkable. Multiple bilateral renal cysts. Kidneys are otherwise normal, without renal  calculi,  solid lesion, or hydronephrosis. Bladder is unremarkable. Stomach/Bowel: Stomach is within normal limits. No evidence of bowel wall thickening, distention, or inflammatory changes. Occasional sigmoid diverticula. Vascular/Lymphatic: Aortic atherosclerosis. There is an unusual, almost entirely occlusive calcific stenosis of the abdominal aorta at the level of the celiac axis and the superior mesenteric artery, status post superior mesenteric artery stenting (series 6, image 97). No enlarged abdominal or pelvic lymph nodes. Reproductive: No mass or other abnormality. Other: No abdominal wall hernia or abnormality. No abdominopelvic ascites. Musculoskeletal: No acute or significant osseous findings. IMPRESSION: 1. No evidence of metastatic disease to the chest, abdomen or pelvis. 2. Normal appearance of the urinary bladder on non urographic examination. 3.  Emphysema (ICD10-J43.9). 4. Aortic Atherosclerosis (ICD10-I70.0). There is an unusual, almost entirely occlusive calcific stenosis of the abdominal aorta at the level of the celiac axis and the superior mesenteric artery, status post superior mesenteric artery stenting (series 6, image 97). Electronically Signed   By: Eddie Candle M.D.   On: 04/22/2019 12:03     Assessment and plan- 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  she is here to discuss CT scan results and further management  I have reviewed CT chest abdomen pelvis images independently and discussed findings with the patient.  Patient has no evidence of recurrence of her small cell cancer of the bladder which was treated back in 2018.  She continues to be in remission.  Since patient was hospitalized after her CT scans for hypokalemia acute kidney injury and diarrhea following her CT scans I will get CT chest and abdomen and pelvis without contrast in 6 months and see her thereafter.  I will repeat BMP next week to follow-up on  her hypokalemia  Visit Diagnosis 1. Small cell carcinoma of bladder ([place])   2. AKI (acute kidney injury) ([place])   3. Hypokalemia      Dr. Randa Evens, MD, MPH [facility] [phone] 05/19/2019 8:25 AM

## 2019-05-21 DIAGNOSIS — N189 Chronic kidney disease, unspecified: Secondary | ICD-10-CM | POA: Diagnosis not present

## 2019-05-21 DIAGNOSIS — I509 Heart failure, unspecified: Secondary | ICD-10-CM | POA: Diagnosis not present

## 2019-05-21 DIAGNOSIS — I130 Hypertensive heart and chronic kidney disease with heart failure and stage 1 through stage 4 chronic kidney disease, or unspecified chronic kidney disease: Secondary | ICD-10-CM | POA: Diagnosis not present

## 2019-05-21 DIAGNOSIS — I2510 Atherosclerotic heart disease of native coronary artery without angina pectoris: Secondary | ICD-10-CM | POA: Diagnosis not present

## 2019-05-21 DIAGNOSIS — I700 Atherosclerosis of aorta: Secondary | ICD-10-CM | POA: Diagnosis not present

## 2019-05-21 DIAGNOSIS — M21371 Foot drop, right foot: Secondary | ICD-10-CM | POA: Diagnosis not present

## 2019-05-23 ENCOUNTER — Inpatient Hospital Stay: Payer: Medicare Other

## 2019-05-25 ENCOUNTER — Telehealth: Payer: Self-pay | Admitting: Internal Medicine

## 2019-05-25 ENCOUNTER — Encounter: Payer: Self-pay | Admitting: Internal Medicine

## 2019-05-25 DIAGNOSIS — R0609 Other forms of dyspnea: Secondary | ICD-10-CM | POA: Insufficient documentation

## 2019-05-25 NOTE — Assessment & Plan Note (Signed)
Replaced while in hospital.  Follow potassium.

## 2019-05-25 NOTE — Assessment & Plan Note (Signed)
Continue risk factor modification.  Has seen vascular as outlined.

## 2019-05-25 NOTE — Assessment & Plan Note (Signed)
Continue on crestor.  Found to have fairly severe calcific stenosis of the aorta around the visceral vessels.  Felt to be a poor candidate for open surgery.

## 2019-05-25 NOTE — Assessment & Plan Note (Signed)
Found on admission to hospital.  Hydrated.  Felt to be related to increased diarrhea.  Lasix held.  Back on lasix now.  Still with loose stool.  Eating and drinking.  Follow metabolic panel.

## 2019-05-25 NOTE — Assessment & Plan Note (Signed)
Follow pressures.  Back on lasix.  Follow metabolic panel.

## 2019-05-25 NOTE — Assessment & Plan Note (Signed)
On crestor.  Follow lipid panel and liver function tests.   

## 2019-05-25 NOTE — Assessment & Plan Note (Signed)
Has been followed by Dr Raul Del.  Recent increased cough and DOE.  Continue nebs and symbicort as outlined.  Prednisone taper as directed.  Follow.

## 2019-05-25 NOTE — Assessment & Plan Note (Signed)
Reports feeling some sob with exertion.  Treat acute flare with prednisone as outlined.  Was questioning if needed oxygen with ambulation.  Has known COPD. Followed by Dr Raul Del.  See if home health can walk to qualify for oxygen.

## 2019-05-25 NOTE — Telephone Encounter (Signed)
Treated her with prednisone.  Please call and confirm cough/breathing ok.  If persistent problems, she is if she is still interested in seeing if qualifies for home O2 - with ambulation.  Will need to arrange if so.

## 2019-05-25 NOTE — Assessment & Plan Note (Signed)
Follow met b and a1c.  

## 2019-05-25 NOTE — Assessment & Plan Note (Signed)
Lasix stopped during recent hospitalization.  Weight increased.  She is back on lasix now.  Weight back down.  Feels her current cough,congestion and sob related to underlying pulmonary etiology.  Follow metabolic panel.

## 2019-05-25 NOTE — Assessment & Plan Note (Signed)
Followed by hematology 

## 2019-05-25 NOTE — Assessment & Plan Note (Signed)
Has been followed by nephrology.  Recent worsening with increased diarrhea.  Treated with IVFs.  Avoid antiinflammatories.  Follow metabolic panel.

## 2019-05-25 NOTE — Assessment & Plan Note (Signed)
Recent admission as outlined.  C.diff negative.  Started after oral contrast.  Unclear etiology.  Refer to GI.  Discussed taking a probiotic.

## 2019-05-26 ENCOUNTER — Other Ambulatory Visit: Payer: Self-pay

## 2019-05-26 ENCOUNTER — Inpatient Hospital Stay: Payer: Medicare Other | Attending: Oncology

## 2019-05-26 DIAGNOSIS — M21371 Foot drop, right foot: Secondary | ICD-10-CM | POA: Diagnosis not present

## 2019-05-26 DIAGNOSIS — Z87891 Personal history of nicotine dependence: Secondary | ICD-10-CM | POA: Diagnosis not present

## 2019-05-26 DIAGNOSIS — I2510 Atherosclerotic heart disease of native coronary artery without angina pectoris: Secondary | ICD-10-CM | POA: Diagnosis not present

## 2019-05-26 DIAGNOSIS — Z951 Presence of aortocoronary bypass graft: Secondary | ICD-10-CM | POA: Diagnosis not present

## 2019-05-26 DIAGNOSIS — Z1501 Genetic susceptibility to malignant neoplasm of breast: Secondary | ICD-10-CM | POA: Diagnosis not present

## 2019-05-26 DIAGNOSIS — N189 Chronic kidney disease, unspecified: Secondary | ICD-10-CM | POA: Diagnosis not present

## 2019-05-26 DIAGNOSIS — E876 Hypokalemia: Secondary | ICD-10-CM | POA: Diagnosis not present

## 2019-05-26 DIAGNOSIS — M810 Age-related osteoporosis without current pathological fracture: Secondary | ICD-10-CM | POA: Diagnosis not present

## 2019-05-26 DIAGNOSIS — I509 Heart failure, unspecified: Secondary | ICD-10-CM | POA: Diagnosis not present

## 2019-05-26 DIAGNOSIS — Z85828 Personal history of other malignant neoplasm of skin: Secondary | ICD-10-CM | POA: Diagnosis not present

## 2019-05-26 DIAGNOSIS — I130 Hypertensive heart and chronic kidney disease with heart failure and stage 1 through stage 4 chronic kidney disease, or unspecified chronic kidney disease: Secondary | ICD-10-CM | POA: Diagnosis not present

## 2019-05-26 DIAGNOSIS — J449 Chronic obstructive pulmonary disease, unspecified: Secondary | ICD-10-CM | POA: Diagnosis not present

## 2019-05-26 DIAGNOSIS — Z8551 Personal history of malignant neoplasm of bladder: Secondary | ICD-10-CM | POA: Diagnosis not present

## 2019-05-26 DIAGNOSIS — M5441 Lumbago with sciatica, right side: Secondary | ICD-10-CM | POA: Diagnosis not present

## 2019-05-26 DIAGNOSIS — C679 Malignant neoplasm of bladder, unspecified: Secondary | ICD-10-CM

## 2019-05-26 DIAGNOSIS — K551 Chronic vascular disorders of intestine: Secondary | ICD-10-CM | POA: Diagnosis not present

## 2019-05-26 DIAGNOSIS — N179 Acute kidney failure, unspecified: Secondary | ICD-10-CM | POA: Diagnosis not present

## 2019-05-26 DIAGNOSIS — D45 Polycythemia vera: Secondary | ICD-10-CM | POA: Diagnosis not present

## 2019-05-26 DIAGNOSIS — R197 Diarrhea, unspecified: Secondary | ICD-10-CM | POA: Diagnosis not present

## 2019-05-26 DIAGNOSIS — I700 Atherosclerosis of aorta: Secondary | ICD-10-CM | POA: Diagnosis not present

## 2019-05-26 LAB — CBC WITH DIFFERENTIAL/PLATELET
Abs Immature Granulocytes: 0.21 10*3/uL — ABNORMAL HIGH (ref 0.00–0.07)
Basophils Absolute: 0 10*3/uL (ref 0.0–0.1)
Basophils Relative: 0 %
Eosinophils Absolute: 0 10*3/uL (ref 0.0–0.5)
Eosinophils Relative: 0 %
HCT: 46.5 % — ABNORMAL HIGH (ref 36.0–46.0)
Hemoglobin: 15.5 g/dL — ABNORMAL HIGH (ref 12.0–15.0)
Immature Granulocytes: 1 %
Lymphocytes Relative: 5 %
Lymphs Abs: 0.8 10*3/uL (ref 0.7–4.0)
MCH: 30.2 pg (ref 26.0–34.0)
MCHC: 33.3 g/dL (ref 30.0–36.0)
MCV: 90.6 fL (ref 80.0–100.0)
Monocytes Absolute: 0.5 10*3/uL (ref 0.1–1.0)
Monocytes Relative: 3 %
Neutro Abs: 15.1 10*3/uL — ABNORMAL HIGH (ref 1.7–7.7)
Neutrophils Relative %: 91 %
Platelets: 338 10*3/uL (ref 150–400)
RBC: 5.13 MIL/uL — ABNORMAL HIGH (ref 3.87–5.11)
RDW: 12.9 % (ref 11.5–15.5)
WBC: 16.7 10*3/uL — ABNORMAL HIGH (ref 4.0–10.5)
nRBC: 0 % (ref 0.0–0.2)

## 2019-05-26 LAB — COMPREHENSIVE METABOLIC PANEL
ALT: 21 U/L (ref 0–44)
AST: 19 U/L (ref 15–41)
Albumin: 4.3 g/dL (ref 3.5–5.0)
Alkaline Phosphatase: 57 U/L (ref 38–126)
Anion gap: 15 (ref 5–15)
BUN: 38 mg/dL — ABNORMAL HIGH (ref 8–23)
CO2: 29 mmol/L (ref 22–32)
Calcium: 9.6 mg/dL (ref 8.9–10.3)
Chloride: 91 mmol/L — ABNORMAL LOW (ref 98–111)
Creatinine, Ser: 1.21 mg/dL — ABNORMAL HIGH (ref 0.44–1.00)
GFR calc Af Amer: 48 mL/min — ABNORMAL LOW (ref >60)
GFR calc non Af Amer: 42 mL/min — ABNORMAL LOW (ref >60)
Glucose, Bld: 173 mg/dL — ABNORMAL HIGH (ref 70–99)
Potassium: 3.4 mmol/L — ABNORMAL LOW (ref 3.5–5.1)
Sodium: 135 mmol/L (ref 135–145)
Total Bilirubin: 1 mg/dL (ref 0.3–1.2)
Total Protein: 7.4 g/dL (ref 6.5–8.1)

## 2019-05-26 NOTE — Telephone Encounter (Signed)
It would be easier for her to do at home.  Let me know what I need to do.

## 2019-05-26 NOTE — Telephone Encounter (Signed)
LMTCB

## 2019-05-26 NOTE — Telephone Encounter (Signed)
Patient doing better but would like to proceed with oxygen. Do you want me to see if home health can do her walk or have her come in for nurse visit at our office?

## 2019-05-27 DIAGNOSIS — R197 Diarrhea, unspecified: Secondary | ICD-10-CM | POA: Diagnosis not present

## 2019-05-27 DIAGNOSIS — I700 Atherosclerosis of aorta: Secondary | ICD-10-CM | POA: Diagnosis not present

## 2019-05-27 DIAGNOSIS — N179 Acute kidney failure, unspecified: Secondary | ICD-10-CM | POA: Diagnosis not present

## 2019-05-27 DIAGNOSIS — E876 Hypokalemia: Secondary | ICD-10-CM | POA: Diagnosis not present

## 2019-05-27 DIAGNOSIS — I130 Hypertensive heart and chronic kidney disease with heart failure and stage 1 through stage 4 chronic kidney disease, or unspecified chronic kidney disease: Secondary | ICD-10-CM | POA: Diagnosis not present

## 2019-05-27 DIAGNOSIS — I509 Heart failure, unspecified: Secondary | ICD-10-CM | POA: Diagnosis not present

## 2019-05-29 DIAGNOSIS — E876 Hypokalemia: Secondary | ICD-10-CM | POA: Diagnosis not present

## 2019-05-29 DIAGNOSIS — I700 Atherosclerosis of aorta: Secondary | ICD-10-CM | POA: Diagnosis not present

## 2019-05-29 DIAGNOSIS — I130 Hypertensive heart and chronic kidney disease with heart failure and stage 1 through stage 4 chronic kidney disease, or unspecified chronic kidney disease: Secondary | ICD-10-CM | POA: Diagnosis not present

## 2019-05-29 DIAGNOSIS — N179 Acute kidney failure, unspecified: Secondary | ICD-10-CM | POA: Diagnosis not present

## 2019-05-29 DIAGNOSIS — I509 Heart failure, unspecified: Secondary | ICD-10-CM | POA: Diagnosis not present

## 2019-05-29 DIAGNOSIS — R197 Diarrhea, unspecified: Secondary | ICD-10-CM | POA: Diagnosis not present

## 2019-05-30 ENCOUNTER — Other Ambulatory Visit: Payer: Self-pay

## 2019-05-30 ENCOUNTER — Ambulatory Visit (INDEPENDENT_AMBULATORY_CARE_PROVIDER_SITE_OTHER): Payer: Medicare Other | Admitting: Vascular Surgery

## 2019-05-30 ENCOUNTER — Ambulatory Visit (INDEPENDENT_AMBULATORY_CARE_PROVIDER_SITE_OTHER): Payer: Medicare Other

## 2019-05-30 ENCOUNTER — Encounter (INDEPENDENT_AMBULATORY_CARE_PROVIDER_SITE_OTHER): Payer: Self-pay | Admitting: Vascular Surgery

## 2019-05-30 VITALS — BP 160/63 | HR 64 | Resp 15 | Wt 131.0 lb

## 2019-05-30 DIAGNOSIS — N183 Chronic kidney disease, stage 3 unspecified: Secondary | ICD-10-CM | POA: Diagnosis not present

## 2019-05-30 DIAGNOSIS — E7800 Pure hypercholesterolemia, unspecified: Secondary | ICD-10-CM | POA: Diagnosis not present

## 2019-05-30 DIAGNOSIS — I10 Essential (primary) hypertension: Secondary | ICD-10-CM | POA: Diagnosis not present

## 2019-05-30 DIAGNOSIS — K551 Chronic vascular disorders of intestine: Secondary | ICD-10-CM | POA: Diagnosis not present

## 2019-05-30 DIAGNOSIS — I2510 Atherosclerotic heart disease of native coronary artery without angina pectoris: Secondary | ICD-10-CM

## 2019-05-30 NOTE — Patient Instructions (Signed)
Angiogram  An angiogram is a procedure used to examine the blood vessels. In this procedure, contrast dye is injected through a long, thin tube (catheter) into an artery. X-rays are then taken, which show if there is a blockage or problem in a blood vessel. The catheter may be inserted in:  The groin area. This is the most common.  The fold of the arm, near the elbow.  The wrist. Tell a health care provider about:  Any allergies you have, including allergies to medicines, shellfish, contrast dye, or iodine.  All medicines you are taking, including vitamins, herbs, eye drops, creams, and over-the-counter medicines.  Any problems you or family members have had with anesthetic medicines.  Any blood disorders you have.  Any surgeries you have had.  Any medical conditions you have or have had, including any kidney problems or kidney failure.  Whether you are pregnant or may be pregnant.  Whether you are breastfeeding. What are the risks? Generally, this is a safe procedure. However, problems may occur, including:  Infection.  Bleeding and bruising.  Allergic reactions to medicines or dyes, including the contrast dye used.  Damage to nearby structures or organs, including damage to blood vessels and kidney damage from the contrast dye.  Blood clots that can lead to a stroke or heart attack. What happens before the procedure? Staying hydrated Follow instructions from your health care provider about hydration, which may include:  Up to 2 hours before the procedure - you may continue to drink clear liquids, such as water, clear fruit juice, black coffee, and plain tea.  Eating and drinking restrictions Follow instructions from your health care provider about eating and drinking, which may include:  8 hours before the procedure - stop eating heavy meals or foods, such as meat, fried foods, or fatty foods.  6 hours before the procedure - stop eating light meals or foods, such  as toast or cereal.  2 hours before the procedure - stop drinking clear liquids. Medicines Ask your health care provider about:  Changing or stopping your regular medicines. This is especially important if you are taking diabetes medicines or blood thinners.  Taking medicines such as aspirin and ibuprofen. These medicines can thin your blood. Do not take these medicines unless your health care provider tells you to take them.  Taking over-the-counter medicines, vitamins, herbs, and supplements. Surgery safety Ask your health care provider:  How your insertion site will be marked.  What steps will be taken to help prevent infection. These may include: ? Removing hair at the insertion site. ? Washing skin with a germ-killing soap. ? Taking antibiotic medicine. General instructions  Do not use any products that contain nicotine or tobacco for at least 4 weeks before the procedure. These products include cigarettes, e-cigarettes, and chewing tobacco. If you need help quitting, ask your health care provider.  You may have blood samples taken.  Plan to have someone take you home from the hospital or clinic.  If you will be going home right after the procedure, plan to have someone with you for 24 hours. What happens during the procedure?  You will lie on your back on an X-ray table. You may be strapped to the table if it is tilted.  An IV will be inserted into one of your veins.  Electrodes may be placed on your chest to monitor your heart rate during the procedure.  You will be given one or both of the following: ? A medicine to   help you relax (sedative). ? A medicine to numb the area where the catheter will be inserted (local anesthetic).  A small incision will be made for catheter insertion.  The catheter will be inserted into an artery using a guide wire. An X-ray procedure (fluoroscopy) will be used to help guide the catheter to the blood vessel to be examined.  A contrast  dye will then be injected into the catheter, and X-rays will be taken. The contrast will help to show where any narrowing or blockages are located in the blood vessels. You may feel flushed as the contrast dye is injected.  Tell your health care provider if you develop chest pain or trouble breathing.  After the fluoroscopy is complete, the catheter will be removed.  A bandage (dressing) will be placed over the site where the catheter was inserted. Pressure will be applied to help stop any bleeding.  The IV will be removed. The procedure may vary among health care providers and hospitals. What happens after the procedure?  Your blood pressure, heart rate, breathing rate, and blood oxygen level will be monitored until you leave the hospital or clinic.  You will be kept in bed lying flat for 6 hours. If the catheter was inserted through your leg, you will be instructed not to bend or cross your legs.  The insertion area and the pulse in your feet or wrist will be checked frequently.  You will be instructed to drink plenty of fluids. This will help wash the contrast dye out of your body.  You may have more blood tests and X-rays. You may also have a test that records the electrical activity of your heart (electrocardiogram, or ECG).  Do not drive for 24 hours if you were given a sedative during your procedure.  It is up to you to get the results of your procedure. Ask your health care provider, or the department that is doing the procedure, when your results will be ready. Summary  An angiogram is a procedure used to examine the blood vessels.  Before the procedure, follow your health care provider's instructions about eating and drinking restrictions. You may be asked to stop eating and drinking several hours before the procedure.  During the procedure, contrast dye is injected through a thin tube (catheter) into an artery. X-rays are then taken.  After the procedure, you will need to  drink plenty of fluids and lie flat for 6 hour. This information is not intended to replace advice given to you by your health care provider. Make sure you discuss any questions you have with your health care provider. Document Revised: 11/13/2018 Document Reviewed: 11/13/2018 [employer] Patient Education  2020 [employer]  

## 2019-05-30 NOTE — Assessment & Plan Note (Signed)
Hydrate and limit contrast with procedure

## 2019-05-30 NOTE — Assessment & Plan Note (Signed)
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 With her worsening symptoms, this is a worrisome finding.  I think at this point, proceeding with angiogram and possible intervention would be in her best interest.  Risks and benefits are discussed and informed consent is obtained.

## 2019-05-30 NOTE — Progress Notes (Signed)
MRN : [id number]  Tina [name] is a 85 y.o. (1934/06/07) female who presents with chief complaint of  Chief Complaint  Patient presents with  . Follow-up    ultrasound follow up  .  History of Present Illness: Patient returns today in follow up of her mesenteric disease.  She reports worsening diarrhea and abdominal pain.  She is having difficulty with eating.  She is still losing weight.  She says she feels miserable.  No fevers or chills.  She is also having a lot of difficulty with her legs and sees a neurologist Monday for neuropathy.  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  Current Outpatient Medications  Medication Sig Dispense Refill  . albuterol (PROVENTIL HFA;VENTOLIN HFA) 108 (90 Base) MCG/ACT inhaler Inhale 2 puffs into the lungs every 6 (six) hours as needed for wheezing or shortness of breath. 1 Inhaler 2  . amLODipine (NORVASC) 5 MG tablet TAKE 1 TABLET BY MOUTH  TWICE DAILY (Patient taking differently: Take 5 mg by mouth 2 (two) times daily. ) 180 tablet 3  . aspirin EC 81 MG tablet Take 1 tablet (81 mg total) by mouth daily. 150 tablet 2  . cholecalciferol (VITAMIN D) 1000 units tablet Take 1,000 Units by mouth daily.    . clopidogrel (PLAVIX) 75 MG tablet Take 1 tablet (75 mg total) by mouth daily. 30 tablet 0  . fluticasone (FLONASE) 50 MCG/ACT nasal spray USE 2 SPRAYS INTO BOTH  NOSTRILS DAILY AS NEEDED  FOR ALLERGIES. (Patient taking differently: Place 2 sprays into both nostrils daily. ) 48 g 1  . hydrALAZINE (APRESOLINE) 25 MG tablet TAKE 1 TABLET BY MOUTH 3  TIMES DAILY (Patient taking differently: Take 25 mg by mouth 3 (three) times daily. ) 270 tablet 3  . ipratropium-albuterol (DUONEB) 0.5-2.5 (3) MG/3ML SOLN Take 3 mLs by nebulization every 6 (six) hours as needed (for shortness of breath). 36 mL 0  .  losartan (COZAAR) 100 MG tablet TAKE 1 TABLET BY MOUTH ONCE DAILY (Patient taking differently: Take 100 mg by mouth daily. TAKE 1 TABLET BY MOUTH ONCE DAILY) 90 tablet 3  . lovastatin (MEVACOR) 40 MG tablet Take 40 mg by mouth daily.    . metoprolol tartrate (LOPRESSOR) 25 MG tablet TAKE 1 TABLET BY MOUTH  TWICE DAILY (Patient taking differently: Take 25 mg by mouth 2 (two) times daily. ) 180 tablet 3  . OVER THE COUNTER MEDICATION daily.    . potassium chloride (K-DUR) 10 MEQ tablet TAKE 1 TABLET BY MOUTH  DAILY (Patient taking differently: Take 10 mEq by mouth daily. ) 90 tablet 1  . predniSONE (DELTASONE) 10 MG tablet Take 6 tablets x 1 day and then decrease by 1/2 tablet per day until down to zero mg. 39 tablet 0  . SYMBICORT 160-4.5 MCG/ACT inhaler USE 2 PUFFS TWO TIMES DAILY 30.6 g 3  . torsemide (DEMADEX) 20 MG tablet Take 20 mg by mouth every morning.  11   No current facility-administered medications for this visit.   Facility-Administered Medications Ordered in Other Visits  Medication Dose Route Frequency Provider Last Rate Last Admin  . heparin lock flush 100 unit/mL  500 Units Intravenous Once Sindy Guadeloupe, MD      . sodium chloride flush (NS) 0.9 % injection 10 mL  10 mL Intravenous PRN Randa Evens  C, MD        Past Medical History:  Diagnosis Date  . Anemia   . Asthma   . Bladder cancer ([place])   . BRCA positive   . CAD (coronary artery disease)   . CHF (congestive heart failure) ([place])   . COPD (chronic obstructive pulmonary disease) ([place])   . Emphysema lung ([place])   . GERD (gastroesophageal reflux disease)   . History of colon polyps   . Hypercholesterolemia   . Hyperglycemia   . Hypertension   . Lung nodules   . Osteoporosis   . Personal history of tobacco use, presenting hazards to health 11/25/2014  . Polycythemia vera(238.4)   . Renal cyst   . Skin cancer     Past Surgical History:  Procedure Laterality Date  . ANGIOPLASTY     coronary (x1)  .  COLONOSCOPY WITH PROPOFOL N/A 03/02/2015   Procedure: COLONOSCOPY WITH PROPOFOL;  Surgeon: Lollie Sails, MD;  Location[facility] ENDOSCOPY;  Service: Endoscopy;  Laterality: N/A;  . COLONOSCOPY WITH PROPOFOL N/A 02/13/2017   Procedure: COLONOSCOPY WITH PROPOFOL;  Surgeon: [name], MD;  Location: [facility] ENDOSCOPY;  Service: Endoscopy;  Laterality: N/A;  . CORONARY ANGIOPLASTY    . CORONARY ARTERY BYPASS GRAFT  09/17/2017   pt denies  . CYSTOSCOPY W/ RETROGRADES Bilateral 02/13/2017   Procedure: CYSTOSCOPY WITH RETROGRADE PYELOGRAM;  Surgeon: Hollice Espy, MD;  Location: [facility] ORS;  Service: Urology;  Laterality: Bilateral;  . CYSTOSCOPY W/ RETROGRADES Bilateral 09/19/2017   Procedure: CYSTOSCOPY WITH RETROGRADE PYELOGRAM;  Surgeon: Hollice Espy, MD;  Location: [facility] ORS;  Service: Urology;  Laterality: Bilateral;  . CYSTOSCOPY WITH BIOPSY  03/26/2017   Procedure: CYSTOSCOPY WITH BIOPSY;  Surgeon: Hollice Espy, MD;  Location: [facility] ORS;  Service: Urology;;  . Consuela Mimes WITH STENT PLACEMENT Bilateral 02/13/2017   Procedure: CYSTOSCOPY WITH STENT PLACEMENT;  Surgeon: Hollice Espy, MD;  Location: [facility] ORS;  Service: Urology;  Laterality: Bilateral;  . CYSTOSCOPY WITH STENT PLACEMENT Bilateral 03/26/2017   Procedure: CYSTOSCOPY WITH STENT EXCHANGE;  Surgeon: Hollice Espy, MD;  Location: [facility] ORS;  Service: Urology;  Laterality: Bilateral;  . ESOPHAGOGASTRODUODENOSCOPY (EGD) WITH PROPOFOL N/A 02/13/2017   Procedure: ESOPHAGOGASTRODUODENOSCOPY (EGD) WITH PROPOFOL;  Surgeon: [name], MD;  [facility] END[facility];  Service: Endoscopy;  Laterality: N/A;  . PORTA CATH INSERTION N/A 02/28/2017   Procedure: PORTA CATH INSERTION;  Surgeon: Algernon Huxley, MD;  Location: [place] CV LAB;  Service: Cardiovascular;  Laterality: N/A;  . PORTA CATH REMOVAL N/A 12/24/2017   Procedure: PORTA CATH REMOVAL;  Surgeon: Algernon Huxley, MD;  Location: [place] CV LAB;  Service: Cardiovascular;   Laterality: N/A;  . SALPINGOOPHORECTOMY    . TONSILECTOMY/ADENOIDECTOMY WITH MYRINGOTOMY    . TONSILLECTOMY    . TRANSURETHRAL RESECTION OF BLADDER TUMOR N/A 02/13/2017   Procedure: TRANSURETHRAL RESECTION OF BLADDER TUMOR (TURBT);  Surgeon: Hollice Espy, MD;  Location: [facility] ORS;  Service: Urology;  Laterality: N/A;  . TUBAL LIGATION    . UPPER GI ENDOSCOPY  02/13/2017  . URETEROSCOPY Left 09/19/2017   Procedure: URETEROSCOPY;  Surgeon: Hollice Espy, MD;  Location: [facility] ORS;  Service: Urology;  Laterality: Left;  Marland Kitchen VISCERAL ANGIOGRAPHY N/A 01/23/2019   Procedure: VISCERAL ANGIOGRAPHY;  Surgeon: Algernon Huxley, MD;  Location: [place] CV LAB;  Service: Cardiovascular;  Laterality: N/A;  . VISCERAL ARTERY INTERVENTION N/A 02/12/2017   Procedure: VISCERAL ARTERY INTERVENTION;  Surgeon: Algernon Huxley, MD;  Location: [place] CV LAB;  Service:  Cardiovascular;  Laterality: N/A;     Social History   Tobacco Use  . Smoking status: Former Smoker    Packs/day: 1.00    Years: 45.00    Pack years: 45.00    Types: Cigarettes    Quit date: 11/11/2003    Years since quitting: 15.5  . Smokeless tobacco: Never Used  Substance Use Topics  . Alcohol use: Not Currently    Alcohol/week: 0.0 standard drinks  . Drug use: No    Family History  Problem Relation Age of Onset  . Cirrhosis Father        died age 50  . Alcohol abuse Father   . Asthma Mother   . Congestive Heart Failure Mother   . Breast cancer Mother        2 (1/2 sisters)  . Osteoarthritis Mother   . Colon cancer Mother   . Lupus Sister   . Alcohol abuse Sister   . Ovarian cancer Sister   . Osteoporosis Sister   . Skin cancer Sister   . Breast cancer Cousin   . Breast cancer Maternal Aunt      Allergies  Allergen Reactions  . Evista [Raloxifene] Other (See Comments)    Night sweats   . Fluticasone-Salmeterol Other (See Comments)    Cough, "chokes me"     REVIEW OF SYSTEMS (Negative unless  checked)  Constitutional: _0 ?Weight loss  _1 ?Fever  _2 ?Chills Cardiac: _3 ?Chest pain   _4 ?Chest pressure   _5 ?Palpitations   _6 ?Shortness of breath when laying flat   _7 ?Shortness of breath at rest   _8 ?Shortness of breath with exertion. Vascular:  _9 ?Pain in legs with walking   _10 ?Pain in legs at rest   _11 ?Pain in legs when laying flat   _12 ?Claudication   _13 ?Pain in feet when walking  _14 ?Pain in feet at rest  _15 ?Pain in feet when laying flat   _16 ?History of DVT   _17 ?Phlebitis   _18 ?Swelling in legs   _19 ?Varicose veins   _20 ?Non-healing ulcers Pulmonary:   _21 ?Uses home oxygen   _22 ?Productive cough   _23 ?Hemoptysis   _24 ?Wheeze  _25 ?COPD   _26 ?Asthma Neurologic:  _27 ?Dizziness  _28 ?Blackouts   _29 ?Seizures   _30 ?History of stroke   _31 ?History of TIA  _32 ?Aphasia   _33 ?Temporary blindness   _34 ?Dysphagia   _35 ?Weakness or numbness in arms   _36 ?Weakness or numbness in legs Musculoskeletal:  _37 ?Arthritis   _38 ?Joint swelling   _39 ?Joint pain   _40 ?Low back pain Hematologic:  _41 ?Easy bruising  _42 ?Easy bleeding   _43 ?Hypercoagulable state   _44 ?Anemic   Gastrointestinal:  _45 ?Blood in stool   _46 ?Vomiting blood  _47 ?Gastroesophageal reflux/heartburn   _48 ?Abdominal pain Genitourinary:  _49 ?Chronic kidney disease   _50 ?Difficult urination  _51 ?Frequent urination  _52 ?Burning with urination   _53 ?Hematuria Skin:  _54 ?Rashes   _55 ?Ulcers   _56 ?Wounds Psychological:  _57 ?History of anxiety   _58 ? History of major depression.  Physical Examination  BP (!) 160/63 (BP Location: Right Arm)   Pulse 64   Resp 15   Wt 131 lb (59.4 kg)   BMI 22.49 kg/m  Gen: NAD.  Thin. Head: [place]/AT, No temporalis wasting. Ear/Nose/Throat: Hearing grossly intact, nares w/o erythema or drainage Eyes: Conjunctiva clear. Sclera non-icteric Neck: Supple.  Trachea midline Pulmonary:  Good air movement, no use of accessory muscles.  Cardiac: RRR, no JVD Vascular:  Vessel Right Left  Radial Palpable Palpable  PT Palpable  Palpable  DP Palpable Palpable   Gastrointestinal: soft, mildly tender. No guarding/reflex.  Musculoskeletal: Right lower leg is weak.  Walks with a cane. Neurologic: Sensation decreased distally in lower extremities.  Symmetrical.  Speech is fluent.  Psychiatric: Judgment intact, Mood & affect appropriate for pt's clinical situation. Dermatologic: No rashes or ulcers noted.  No cellulitis or open wounds.       Labs Recent Results (from the past 2160 hour(s))  Basic metabolic panel     Status: Abnormal   Collection Time: 03/21/19  9:27 AM  Result Value Ref Range   Sodium 140 135 - 145 mEq/L   Potassium 4.0 3.5 - 5.1 mEq/L   Chloride 104 96 - 112 mEq/L   CO2 26 19 - 32 mEq/L   Glucose, Bld 135 (H) 70 - 99 mg/dL   BUN 17 6 - 23 mg/dL   Creatinine, Ser 0.99 0.40 - 1.20 mg/dL   GFR 53.66 (L) >60.00 mL/min   Calcium 9.4 8.4 - 10.5 mg/dL  Lipid panel     Status: None   Collection Time: 03/21/19  9:27 AM  Result Value Ref Range   Cholesterol 161 0 - 200 mg/dL    Comment: ATP III Classification       Desirable:  < 200 mg/dL               Borderline High:  200 - 239 mg/dL          High:  > = 240 mg/dL   Triglycerides 89.0 0.0 - 149.0 mg/dL    Comment: Normal:  <150 mg/dLBorderline High:  150 - 199 mg/dL   HDL 67.50 >39.00 mg/dL   VLDL 17.8 0.0 - 40.0 mg/dL   LDL Cholesterol 76 0 - 99 mg/dL   Total CHOL/HDL Ratio 2     Comment:                Men          Women1/2 Average Risk     3.4          3.3Average Risk          5.0          4.42X Average Risk          9.6          7.13X Average Risk          15.0          11.0                       NonHDL 93.93     Comment: NOTE:  Non-HDL goal should be 30 mg/dL higher than patient's LDL goal (i.e. LDL goal of < 70 mg/dL, would have non-HDL goal of < 100 mg/dL)  Hepatic function panel     Status: None   Collection Time: 03/21/19  9:27 AM  Result Value Ref Range   Total Bilirubin 0.9 0.2 - 1.2 mg/dL   Bilirubin, Direct 0.2 0.0 - 0.3 mg/dL    Alkaline Phosphatase 54 39 - 117 U/L   AST 16 0 - 37 U/L   ALT 14 0 - 35 U/L   Total Protein 6.4 6.0 - 8.3 g/dL   Albumin 4.3 3.5 - 5.2 g/dL  Hemoglobin A1c     Status: None   Collection Time: 03/21/19  9:27 AM  Result Value Ref Range   Hgb A1c MFr Bld 5.5 4.6 - 6.5 %  Comment: Glycemic Control Guidelines for People with Diabetes:Non Diabetic:  <6%Goal of Therapy: <7%Additional Action Suggested:  >8%   Urinalysis, Complete w Microscopic     Status: Abnormal   Collection Time: 05/10/19 12:15 PM  Result Value Ref Range   Color, Urine YELLOW (A) YELLOW   APPearance CLEAR (A) CLEAR   Specific Gravity, Urine 1.011 1.005 - 1.030   pH 5.0 5.0 - 8.0   Glucose, UA NEGATIVE NEGATIVE mg/dL   Hgb urine dipstick NEGATIVE NEGATIVE   Bilirubin Urine NEGATIVE NEGATIVE   Ketones, ur NEGATIVE NEGATIVE mg/dL   Protein, ur NEGATIVE NEGATIVE mg/dL   Nitrite NEGATIVE NEGATIVE   Leukocytes,Ua NEGATIVE NEGATIVE   RBC / HPF 0-5 0 - 5 RBC/hpf   WBC, UA 0-5 0 - 5 WBC/hpf   Bacteria, UA RARE (A) NONE SEEN   Squamous Epithelial / LPF 0-5 0 - 5   Mucus PRESENT    Hyaline Casts, UA PRESENT    Cellular Cast, UA PRESENT     Comment: Performed at [facility], [address] [place], [place] [address]  Lipase, blood     Status: None   Collection Time: 05/10/19 12:24 PM  Result Value Ref Range   Lipase 23 11 - 51 U/L    Comment: Performed at [facility], [address] [place], [place] [address]  Comprehensive metabolic panel     Status: Abnormal   Collection Time: 05/10/19 12:24 PM  Result Value Ref Range   Sodium 143 135 - 145 mmol/L   Potassium 2.9 (L) 3.5 - 5.1 mmol/L   Chloride 104 98 - 111 mmol/L   CO2 21 (L) 22 - 32 mmol/L   Glucose, Bld 116 (H) 70 - 99 mg/dL   BUN 41 (H) 8 - 23 mg/dL   Creatinine, Ser 2.71 (H) 0.44 - 1.00 mg/dL   Calcium 9.6 8.9 - 10.3 mg/dL   Total Protein 7.7 6.5 - 8.1 g/dL   Albumin 4.5 3.5 - 5.0 g/dL   AST 29 15 - 41 U/L   ALT 23 0 - 44 U/L    Alkaline Phosphatase 54 38 - 126 U/L   Total Bilirubin 1.2 0.3 - 1.2 mg/dL   GFR calc non Af Amer 16 (L) >60 mL/min   GFR calc Af Amer 18 (L) >60 mL/min   Anion gap 18 (H) 5 - 15    Comment: Performed at [facility], [address] [place], [place] [address]  CBC     Status: Abnormal   Collection Time: 05/10/19 12:24 PM  Result Value Ref Range   WBC 9.1 4.0 - 10.5 K/uL   RBC 4.98 3.87 - 5.11 MIL/uL   Hemoglobin 15.2 (H) 12.0 - 15.0 g/dL   HCT 43.0 36.0 - 46.0 %   MCV 86.3 80.0 - 100.0 fL   MCH 30.5 26.0 - 34.0 pg   MCHC 35.3 30.0 - 36.0 g/dL   RDW 13.2 11.5 - 15.5 %   Platelets 247 150 - 400 K/uL   nRBC 0.0 0.0 - 0.2 %    Comment: Performed at [facility], [address] [place], [place] [address]  Magnesium     Status: None   Collection Time: 05/10/19 12:24 PM  Result Value Ref Range   Magnesium 2.2 1.7 - 2.4 mg/dL    Comment: Performed at [facility], [address] [place], [place] [address]  Respiratory Panel by RT PCR (Flu A&B, Covid) - Nasopharyngeal Swab  Status: None   Collection Time: 05/10/19  5:13 PM   Specimen: Nasopharyngeal Swab  Result Value Ref Range   SARS Coronavirus 2 by RT PCR NEGATIVE NEGATIVE    Comment: (NOTE) SARS-CoV-2 target nucleic acids are NOT DETECTED. The SARS-CoV-2 RNA is generally detectable in upper respiratoy specimens during the acute phase of infection. The lowest concentration of SARS-CoV-2 viral copies this assay can detect is 131 copies/mL. A negative result does not preclude SARS-Cov-2 infection and should not be used as the sole basis for treatment or other patient management decisions. A negative result may occur with  improper specimen collection/handling, submission of specimen other than nasopharyngeal swab, presence of viral mutation(s) within the areas targeted by this assay, and inadequate number of viral copies (<131 copies/mL). A negative result must be combined with  clinical observations, patient history, and epidemiological information. The expected result is Negative. Fact Sheet for Patients:  PinkCheek.be Fact Sheet for Healthcare Providers:  GravelBags.it This test is not yet ap proved or cleared by the [place] FDA and  has been authorized for detection and/or diagnosis of SARS-CoV-2 by FDA under an Emergency Use Authorization (EUA). This EUA will remain  in effect (meaning this test can be used) for the duration of the COVID-19 declaration under Section 564(b)(1) of the Act, 21 U.S.C. section 360bbb-3(b)(1), unless the authorization is terminated or revoked sooner.    Influenza A by PCR NEGATIVE NEGATIVE   Influenza B by PCR NEGATIVE NEGATIVE    Comment: (NOTE) The Xpert Xpress SARS-CoV-2/FLU/RSV assay is intended as an aid in  the diagnosis of influenza from Nasopharyngeal swab specimens and  should not be used as a sole basis for treatment. Nasal washings and  aspirates are unacceptable for Xpert Xpress SARS-CoV-2/FLU/RSV  testing. Fact Sheet for Patients: PinkCheek.be Fact Sheet for Healthcare Providers: GravelBags.it This test is not yet approved or cleared by the [place] FDA and  has been authorized for detection and/or diagnosis of SARS-CoV-2 by  FDA under an Emergency Use Authorization (EUA). This EUA will remain  in effect (meaning this test can be used) for the duration of the  Covid-19 declaration under Section 564(b)(1) of the Act, 21  U.S.C. section 360bbb-3(b)(1), unless the authorization is  terminated or revoked. Performed at [facility], [address] [place], [place] [address]   GI pathogen panel by PCR, stool     Status: None   Collection Time: 05/11/19  4:41 AM   Specimen: Stool  Result Value Ref Range   Plesiomonas shigelloides NOT DETECTED NOT DETECTED   Yersinia  enterocolitica NOT DETECTED NOT DETECTED   Vibrio NOT DETECTED NOT DETECTED   Enteropathogenic E coli NOT DETECTED NOT DETECTED   E coli (ETEC) LT/ST NOT DETECTED NOT DETECTED   E coli 0109 by PCR Not applicable NOT DETECTED   Cryptosporidium by PCR NOT DETECTED NOT DETECTED   Entamoeba histolytica NOT DETECTED NOT DETECTED   Adenovirus F 40/41 NOT DETECTED NOT DETECTED   Norovirus GI/GII NOT DETECTED NOT DETECTED   Sapovirus NOT DETECTED NOT DETECTED    Comment: (NOTE) Performed At: [facility] [place], [place] [id number] Rush Farmer MD [id number]    Vibrio cholerae NOT DETECTED NOT DETECTED   Campylobacter by PCR NOT DETECTED NOT DETECTED   Salmonella by PCR NOT DETECTED NOT DETECTED   E coli (STEC) NOT DETECTED NOT DETECTED   Enteroaggregative E coli NOT DETECTED NOT DETECTED   Shigella by PCR NOT DETECTED NOT DETECTED  Cyclospora cayetanensis NOT DETECTED NOT DETECTED   Astrovirus NOT DETECTED NOT DETECTED   G lamblia by PCR NOT DETECTED NOT DETECTED   Rotavirus A by PCR NOT DETECTED NOT DETECTED  C difficile quick scan w PCR reflex     Status: None   Collection Time: 05/11/19  4:41 AM   Specimen: STOOL  Result Value Ref Range   C Diff antigen NEGATIVE NEGATIVE   C Diff toxin NEGATIVE NEGATIVE   C Diff interpretation No C. difficile detected.     Comment: Performed at [facility], [address] [place], [place] [address]  TSH     Status: None   Collection Time: 05/11/19  5:19 AM  Result Value Ref Range   TSH 1.919 0.350 - 4.500 uIU/mL    Comment: Performed by a 3rd Generation assay with a functional sensitivity of <=0.01 uIU/mL. Performed at [facility], [address] [place], [place] [address]   Basic metabolic panel     Status: Abnormal   Collection Time: 05/11/19  5:19 AM  Result Value Ref Range   Sodium 141 135 - 145 mmol/L   Potassium 3.2 (L) 3.5 - 5.1 mmol/L   Chloride 109 98 - 111 mmol/L   CO2 21 (L) 22  - 32 mmol/L   Glucose, Bld 112 (H) 70 - 99 mg/dL   BUN 32 (H) 8 - 23 mg/dL   Creatinine, Ser 1.72 (H) 0.44 - 1.00 mg/dL   Calcium 8.6 (L) 8.9 - 10.3 mg/dL   GFR calc non Af Amer 27 (L) >60 mL/min   GFR calc Af Amer 32 (L) >60 mL/min   Anion gap 11 5 - 15    Comment: Performed at [facility], [address] [place], [place] [address]  CBC     Status: None   Collection Time: 05/11/19  5:19 AM  Result Value Ref Range   WBC 8.2 4.0 - 10.5 K/uL   RBC 4.17 3.87 - 5.11 MIL/uL   Hemoglobin 12.7 12.0 - 15.0 g/dL   HCT 36.1 36.0 - 46.0 %   MCV 86.6 80.0 - 100.0 fL   MCH 30.5 26.0 - 34.0 pg   MCHC 35.2 30.0 - 36.0 g/dL   RDW 13.3 11.5 - 15.5 %   Platelets 195 150 - 400 K/uL   nRBC 0.0 0.0 - 0.2 %    Comment: Performed at [facility], [address] [place], [place] [address]  Phosphorus     Status: Abnormal   Collection Time: 05/11/19  5:19 AM  Result Value Ref Range   Phosphorus 2.4 (L) 2.5 - 4.6 mg/dL    Comment: Performed at [facility], [address] [place], [place] [address]  Glucose, capillary     Status: Abnormal   Collection Time: 05/11/19  8:07 AM  Result Value Ref Range   Glucose-Capillary 105 (H) 70 - 99 mg/dL   Comment 1 Notify RN   Basic metabolic panel     Status: Abnormal   Collection Time: 05/12/19  7:13 AM  Result Value Ref Range   Sodium 143 135 - 145 mmol/L   Potassium 3.2 (L) 3.5 - 5.1 mmol/L   Chloride 114 (H) 98 - 111 mmol/L   CO2 21 (L) 22 - 32 mmol/L   Glucose, Bld 98 70 - 99 mg/dL   BUN 17 8 - 23 mg/dL   Creatinine, Ser 0.87 0.44 - 1.00 mg/dL   Calcium 8.4 (L) 8.9 - 10.3 mg/dL  GFR calc non Af Amer >60 >60 mL/min   GFR calc Af Amer >60 >60 mL/min   Anion gap 8 5 - 15    Comment: Performed at [facility], [address] [place], [place] [address]  Magnesium     Status: None   Collection Time: 05/12/19  7:13 AM  Result Value Ref Range   Magnesium 1.8 1.7 - 2.4 mg/dL    Comment: Performed at [facility], [address] [place], [place] [address]  Phosphorus     Status: Abnormal   Collection Time: 05/12/19  7:13 AM  Result Value Ref Range   Phosphorus 2.1 (L) 2.5 - 4.6 mg/dL    Comment: Performed at [facility], [address] [place], [place] [address]  Basic metabolic panel     Status: Abnormal   Collection Time: 05/13/19  7:55 AM  Result Value Ref Range   Sodium 142 135 - 145 mmol/L   Potassium 2.8 (L) 3.5 - 5.1 mmol/L   Chloride 108 98 - 111 mmol/L   CO2 23 22 - 32 mmol/L   Glucose, Bld 108 (H) 70 - 99 mg/dL   BUN 12 8 - 23 mg/dL   Creatinine, Ser 0.81 0.44 - 1.00 mg/dL   Calcium 8.3 (L) 8.9 - 10.3 mg/dL   GFR calc non Af Amer >60 >60 mL/min   GFR calc Af Amer >60 >60 mL/min   Anion gap 11 5 - 15    Comment: Performed at [facility], [address] [place], [place] [address]  Magnesium     Status: None   Collection Time: 05/13/19  7:55 AM  Result Value Ref Range   Magnesium 1.7 1.7 - 2.4 mg/dL    Comment: Performed at [facility], [address] [place], [place] [address]  Phosphorus     Status: None   Collection Time: 05/13/19  7:55 AM  Result Value Ref Range   Phosphorus 2.9 2.5 - 4.6 mg/dL    Comment: Performed at [facility], [address] [place], [place] [address]  Glucose, capillary     Status: Abnormal   Collection Time: 05/13/19  8:24 AM  Result Value Ref Range   Glucose-Capillary 102 (H) 70 - 99 mg/dL  Potassium     Status: Abnormal   Collection Time: 05/13/19  2:51 PM  Result Value Ref Range   Potassium 3.3 (L) 3.5 - 5.1 mmol/L    Comment: Performed at [facility], [address] [place], [place] [address]  Comprehensive metabolic panel     Status: Abnormal   Collection Time: 05/26/19 11:21 AM  Result Value Ref Range   Sodium 135 135 - 145 mmol/L   Potassium 3.4 (L) 3.5 - 5.1 mmol/L   Chloride 91 (L) 98 - 111 mmol/L   CO2 29 22 - 32 mmol/L   Glucose, Bld 173 (H) 70 - 99 mg/dL    BUN 38 (H) 8 - 23 mg/dL   Creatinine, Ser 1.21 (H) 0.44 - 1.00 mg/dL   Calcium 9.6 8.9 - 10.3 mg/dL   Total Protein 7.4 6.5 - 8.1 g/dL   Albumin 4.3 3.5 - 5.0 g/dL   AST 19 15 - 41 U/L   ALT 21 0 - 44 U/L   Alkaline Phosphatase 57 38 - 126 U/L   Total Bilirubin 1.0 0.3 - 1.2 mg/dL   GFR calc non Af Amer 42 (L) >60 mL/min   GFR calc Af Amer 48 (L) >60  mL/min   Anion gap 15 5 - 15    Comment: Performed at [facility], [address] [place], [place] [address]  CBC with Differential/Platelet     Status: Abnormal   Collection Time: 05/26/19 11:21 AM  Result Value Ref Range   WBC 16.7 (H) 4.0 - 10.5 K/uL   RBC 5.13 (H) 3.87 - 5.11 MIL/uL   Hemoglobin 15.5 (H) 12.0 - 15.0 g/dL   HCT 46.5 (H) 36.0 - 46.0 %   MCV 90.6 80.0 - 100.0 fL   MCH 30.2 26.0 - 34.0 pg   MCHC 33.3 30.0 - 36.0 g/dL   RDW 12.9 11.5 - 15.5 %   Platelets 338 150 - 400 K/uL   nRBC 0.0 0.0 - 0.2 %   Neutrophils Relative % 91 %   Neutro Abs 15.1 (H) 1.7 - 7.7 K/uL   Lymphocytes Relative 5 %   Lymphs Abs 0.8 0.7 - 4.0 K/uL   Monocytes Relative 3 %   Monocytes Absolute 0.5 0.1 - 1.0 K/uL   Eosinophils Relative 0 %   Eosinophils Absolute 0.0 0.0 - 0.5 K/uL   Basophils Relative 0 %   Basophils Absolute 0.0 0.0 - 0.1 K/uL   Immature Granulocytes 1 %   Abs Immature Granulocytes 0.21 (H) 0.00 - 0.07 K/uL    Comment: Performed at [facility], [address] [place], [place] [address]    Radiology No results found.  Assessment/Plan Essential hypertension blood pressure control important in reducing the progression of atherosclerotic disease. On appropriate oral medications.   CAD (coronary artery disease) Continue cardiac and antihypertensive medications as already ordered and reviewed, no changes at this time. Continue statin as ordered and reviewed, no changes at this time Nitrates PRN for chest pain  Hypercholesterolemia lipid control important in reducing the progression of  atherosclerotic disease. Continue statin therapy   CKD (chronic kidney disease), stage III (HCC) Hydrate and limit contrast with procedure  Mesenteric ischemia due to arterial insufficiency ([place]) Her duplex today shows more elevated velocities in her SMA stent than previously seen.  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 With her worsening symptoms, this is a worrisome finding.  I think at this point, proceeding with angiogram and possible intervention would be in her best interest.  Risks and benefits are discussed and informed consent is obtained.    Leotis Pain, MD  05/30/2019 11:54 AM    This note was created with Dragon medical transcription system.  Any errors from dictation are purely unintentional

## 2019-05-30 NOTE — Telephone Encounter (Signed)
Spoke with Home Health. They are checking to see what needs to be done to do walk at home. They will call if anything is needed from [name]. If not, they will send [name] readings to submit

## 2019-05-30 NOTE — Assessment & Plan Note (Signed)
lipid control important in reducing the progression of atherosclerotic disease. Continue statin therapy  

## 2019-06-02 DIAGNOSIS — E559 Vitamin D deficiency, unspecified: Secondary | ICD-10-CM | POA: Diagnosis not present

## 2019-06-02 DIAGNOSIS — E531 Pyridoxine deficiency: Secondary | ICD-10-CM | POA: Diagnosis not present

## 2019-06-02 DIAGNOSIS — Z114 Encounter for screening for human immunodeficiency virus [HIV]: Secondary | ICD-10-CM | POA: Diagnosis not present

## 2019-06-02 DIAGNOSIS — Z79899 Other long term (current) drug therapy: Secondary | ICD-10-CM | POA: Diagnosis not present

## 2019-06-02 DIAGNOSIS — E538 Deficiency of other specified B group vitamins: Secondary | ICD-10-CM | POA: Diagnosis not present

## 2019-06-02 DIAGNOSIS — E519 Thiamine deficiency, unspecified: Secondary | ICD-10-CM | POA: Diagnosis not present

## 2019-06-02 DIAGNOSIS — R202 Paresthesia of skin: Secondary | ICD-10-CM | POA: Diagnosis not present

## 2019-06-02 DIAGNOSIS — R2689 Other abnormalities of gait and mobility: Secondary | ICD-10-CM | POA: Diagnosis not present

## 2019-06-02 DIAGNOSIS — R200 Anesthesia of skin: Secondary | ICD-10-CM | POA: Diagnosis not present

## 2019-06-03 ENCOUNTER — Telehealth (INDEPENDENT_AMBULATORY_CARE_PROVIDER_SITE_OTHER): Payer: Self-pay

## 2019-06-03 DIAGNOSIS — E876 Hypokalemia: Secondary | ICD-10-CM | POA: Diagnosis not present

## 2019-06-03 DIAGNOSIS — N179 Acute kidney failure, unspecified: Secondary | ICD-10-CM | POA: Diagnosis not present

## 2019-06-03 DIAGNOSIS — R197 Diarrhea, unspecified: Secondary | ICD-10-CM | POA: Diagnosis not present

## 2019-06-03 DIAGNOSIS — I509 Heart failure, unspecified: Secondary | ICD-10-CM | POA: Diagnosis not present

## 2019-06-03 DIAGNOSIS — I700 Atherosclerosis of aorta: Secondary | ICD-10-CM | POA: Diagnosis not present

## 2019-06-03 DIAGNOSIS — I130 Hypertensive heart and chronic kidney disease with heart failure and stage 1 through stage 4 chronic kidney disease, or unspecified chronic kidney disease: Secondary | ICD-10-CM | POA: Diagnosis not present

## 2019-06-03 NOTE — Telephone Encounter (Signed)
Spoke with the patient and she is now scheduled for a mesenteric angio with Dr. Lucky Cowboy for 06/09/19 with a 10:15 am arrival time to the [facility]. Patient will do covid testing on 06/05/19 between 12:30-2:30 pm at the [facility]. Pre-procedure instructions were discussed and will be mailed to the patient. Patient stated she was writing it down.

## 2019-06-04 DIAGNOSIS — R200 Anesthesia of skin: Secondary | ICD-10-CM | POA: Diagnosis not present

## 2019-06-04 DIAGNOSIS — R202 Paresthesia of skin: Secondary | ICD-10-CM | POA: Diagnosis not present

## 2019-06-04 NOTE — Telephone Encounter (Signed)
Order faxed for PT to do walk

## 2019-06-05 ENCOUNTER — Other Ambulatory Visit
Admission: RE | Admit: 2019-06-05 | Discharge: 2019-06-05 | Disposition: A | Discharge status: Home / Self Care | Payer: Medicare Other | Source: Ambulatory Visit | Attending: Vascular Surgery | Admitting: Vascular Surgery

## 2019-06-05 DIAGNOSIS — N179 Acute kidney failure, unspecified: Secondary | ICD-10-CM | POA: Diagnosis not present

## 2019-06-05 DIAGNOSIS — I509 Heart failure, unspecified: Secondary | ICD-10-CM | POA: Diagnosis not present

## 2019-06-05 DIAGNOSIS — E876 Hypokalemia: Secondary | ICD-10-CM | POA: Diagnosis not present

## 2019-06-05 DIAGNOSIS — I130 Hypertensive heart and chronic kidney disease with heart failure and stage 1 through stage 4 chronic kidney disease, or unspecified chronic kidney disease: Secondary | ICD-10-CM | POA: Diagnosis not present

## 2019-06-05 DIAGNOSIS — Z01812 Encounter for preprocedural laboratory examination: Secondary | ICD-10-CM | POA: Diagnosis not present

## 2019-06-05 DIAGNOSIS — R197 Diarrhea, unspecified: Secondary | ICD-10-CM | POA: Diagnosis not present

## 2019-06-05 DIAGNOSIS — I700 Atherosclerosis of aorta: Secondary | ICD-10-CM | POA: Diagnosis not present

## 2019-06-05 DIAGNOSIS — Z20822 Contact with and (suspected) exposure to covid-19: Secondary | ICD-10-CM | POA: Diagnosis not present

## 2019-06-05 LAB — SARS CORONAVIRUS 2 (TAT 6-24 HRS): SARS Coronavirus 2: NEGATIVE

## 2019-06-08 ENCOUNTER — Other Ambulatory Visit (INDEPENDENT_AMBULATORY_CARE_PROVIDER_SITE_OTHER): Payer: Self-pay | Admitting: Nurse Practitioner

## 2019-06-09 ENCOUNTER — Ambulatory Visit
Admission: RE | Admit: 2019-06-09 | Discharge: 2019-06-09 | Disposition: A | Discharge status: Home / Self Care | Payer: Medicare Other | Attending: Vascular Surgery | Admitting: Vascular Surgery

## 2019-06-09 ENCOUNTER — Encounter: Payer: Self-pay | Admitting: Vascular Surgery

## 2019-06-09 ENCOUNTER — Encounter
Admission: RE | Disposition: A | Discharge status: Home / Self Care | Payer: Self-pay | Source: Home / Self Care | Attending: Vascular Surgery

## 2019-06-09 ENCOUNTER — Other Ambulatory Visit: Payer: Self-pay

## 2019-06-09 DIAGNOSIS — I701 Atherosclerosis of renal artery: Secondary | ICD-10-CM | POA: Diagnosis not present

## 2019-06-09 DIAGNOSIS — Z79899 Other long term (current) drug therapy: Secondary | ICD-10-CM | POA: Insufficient documentation

## 2019-06-09 DIAGNOSIS — M810 Age-related osteoporosis without current pathological fracture: Secondary | ICD-10-CM | POA: Insufficient documentation

## 2019-06-09 DIAGNOSIS — Z7982 Long term (current) use of aspirin: Secondary | ICD-10-CM | POA: Insufficient documentation

## 2019-06-09 DIAGNOSIS — N183 Chronic kidney disease, stage 3 unspecified: Secondary | ICD-10-CM | POA: Insufficient documentation

## 2019-06-09 DIAGNOSIS — I350 Nonrheumatic aortic (valve) stenosis: Secondary | ICD-10-CM | POA: Diagnosis not present

## 2019-06-09 DIAGNOSIS — D649 Anemia, unspecified: Secondary | ICD-10-CM | POA: Diagnosis not present

## 2019-06-09 DIAGNOSIS — J449 Chronic obstructive pulmonary disease, unspecified: Secondary | ICD-10-CM | POA: Diagnosis not present

## 2019-06-09 DIAGNOSIS — E7800 Pure hypercholesterolemia, unspecified: Secondary | ICD-10-CM | POA: Diagnosis not present

## 2019-06-09 DIAGNOSIS — I130 Hypertensive heart and chronic kidney disease with heart failure and stage 1 through stage 4 chronic kidney disease, or unspecified chronic kidney disease: Secondary | ICD-10-CM | POA: Insufficient documentation

## 2019-06-09 DIAGNOSIS — K551 Chronic vascular disorders of intestine: Secondary | ICD-10-CM | POA: Insufficient documentation

## 2019-06-09 DIAGNOSIS — K219 Gastro-esophageal reflux disease without esophagitis: Secondary | ICD-10-CM | POA: Diagnosis not present

## 2019-06-09 DIAGNOSIS — Z7902 Long term (current) use of antithrombotics/antiplatelets: Secondary | ICD-10-CM | POA: Insufficient documentation

## 2019-06-09 DIAGNOSIS — I509 Heart failure, unspecified: Secondary | ICD-10-CM | POA: Insufficient documentation

## 2019-06-09 DIAGNOSIS — Z8551 Personal history of malignant neoplasm of bladder: Secondary | ICD-10-CM | POA: Insufficient documentation

## 2019-06-09 DIAGNOSIS — Z87891 Personal history of nicotine dependence: Secondary | ICD-10-CM | POA: Diagnosis not present

## 2019-06-09 DIAGNOSIS — I2510 Atherosclerotic heart disease of native coronary artery without angina pectoris: Secondary | ICD-10-CM | POA: Insufficient documentation

## 2019-06-09 DIAGNOSIS — Z7951 Long term (current) use of inhaled steroids: Secondary | ICD-10-CM | POA: Insufficient documentation

## 2019-06-09 HISTORY — PX: VISCERAL ANGIOGRAPHY: CATH118276

## 2019-06-09 LAB — BUN: BUN: 20 mg/dL (ref 8–23)

## 2019-06-09 LAB — CREATININE, SERUM
Creatinine, Ser: 1.08 mg/dL — ABNORMAL HIGH (ref 0.44–1.00)
GFR calc Af Amer: 55 mL/min — ABNORMAL LOW (ref >60)
GFR calc non Af Amer: 48 mL/min — ABNORMAL LOW (ref >60)

## 2019-06-09 SURGERY — VISCERAL ANGIOGRAPHY
Anesthesia: Moderate Sedation

## 2019-06-09 MED ORDER — HEPARIN SODIUM (PORCINE) 1000 UNIT/ML IJ SOLN
INTRAMUSCULAR | Status: DC | PRN
  Administered 2019-06-09: 4000 [IU] via INTRAVENOUS

## 2019-06-09 MED ORDER — FAMOTIDINE 20 MG PO TABS: 40.0000 mg | ORAL_TABLET | Freq: Once | ORAL | Status: DC | PRN

## 2019-06-09 MED ORDER — CEFAZOLIN SODIUM-DEXTROSE 2-4 GM/100ML-% IV SOLN
2.0000 g | Freq: Once | INTRAVENOUS | Status: AC
  Administered 2019-06-09: 12:00:00 2 g via INTRAVENOUS

## 2019-06-09 MED ORDER — SODIUM CHLORIDE 0.9 % IV SOLN
INTRAVENOUS | Status: DC
  Administered 2019-06-09: 1000 mL via INTRAVENOUS

## 2019-06-09 MED ORDER — ONDANSETRON HCL 4 MG/2ML IJ SOLN: 4.0000 mg | Freq: Four times a day (QID) | INTRAMUSCULAR | Status: DC | PRN

## 2019-06-09 MED ORDER — HYDROMORPHONE HCL 1 MG/ML IJ SOLN: 1.0000 mg | Freq: Once | INTRAMUSCULAR | Status: DC | PRN

## 2019-06-09 MED ORDER — METHYLPREDNISOLONE SODIUM SUCC 125 MG IJ SOLR: 125.0000 mg | Freq: Once | INTRAMUSCULAR | Status: DC | PRN

## 2019-06-09 MED ORDER — DIPHENHYDRAMINE HCL 50 MG/ML IJ SOLN: 50.0000 mg | Freq: Once | INTRAMUSCULAR | Status: DC | PRN

## 2019-06-09 MED ORDER — MIDAZOLAM HCL 2 MG/2ML IJ SOLN
INTRAMUSCULAR | Status: DC | PRN
  Administered 2019-06-09: 2 mg via INTRAVENOUS

## 2019-06-09 MED ORDER — FENTANYL CITRATE (PF) 100 MCG/2ML IJ SOLN
INTRAMUSCULAR | Status: DC | PRN
  Administered 2019-06-09: 50 ug via INTRAVENOUS

## 2019-06-09 MED ORDER — FENTANYL CITRATE (PF) 100 MCG/2ML IJ SOLN
INTRAMUSCULAR | Status: AC
  Filled 2019-06-09: qty 2

## 2019-06-09 MED ORDER — HEPARIN SODIUM (PORCINE) 1000 UNIT/ML IJ SOLN
INTRAMUSCULAR | Status: AC
  Filled 2019-06-09: qty 1

## 2019-06-09 MED ORDER — IODIXANOL 320 MG/ML IV SOLN
INTRAVENOUS | Status: DC | PRN
  Administered 2019-06-09: 45 mL via INTRA_ARTERIAL

## 2019-06-09 MED ORDER — MIDAZOLAM HCL 2 MG/ML PO SYRP: 8.0000 mg | ORAL_SOLUTION | Freq: Once | ORAL | Status: DC | PRN

## 2019-06-09 MED ORDER — SODIUM CHLORIDE 0.9 % IV BOLUS
INTRAVENOUS | Status: AC | PRN
  Administered 2019-06-09: 250 mL via INTRAVENOUS

## 2019-06-09 MED ORDER — MIDAZOLAM HCL 5 MG/5ML IJ SOLN
INTRAMUSCULAR | Status: AC
  Filled 2019-06-09: qty 5

## 2019-06-09 SURGICAL SUPPLY — 15 items
BALLN LUTONIX 018 5X40X130 (BALLOONS) ×3
BALLOON LUTONIX 018 5X40X130 (BALLOONS) ×1 IMPLANT
CATH BEACON 5 .035 65 KMP TIP (CATHETERS) ×3 IMPLANT
CATH PIG 70CM (CATHETERS) ×3 IMPLANT
DEVICE PRESTO INFLATION (MISCELLANEOUS) ×3 IMPLANT
DEVICE STARCLOSE SE CLOSURE (Vascular Products) ×3 IMPLANT
GLIDEWIRE ADV .035X180CM (WIRE) ×3 IMPLANT
GUIDEWIRE ADV .018X180CM (WIRE) ×3 IMPLANT
PACK ANGIOGRAPHY (CUSTOM PROCEDURE TRAY) ×3 IMPLANT
SHEATH BRITE TIP 5FRX11 (SHEATH) ×3 IMPLANT
SYR MEDRAD MARK 7 150ML (SYRINGE) ×3 IMPLANT
TUBING CONTRAST HIGH PRESS 72 (TUBING) ×3 IMPLANT
VALVE HEMO TOUHY BORST Y (ADAPTER) ×3 IMPLANT
WIRE G V18X300CM (WIRE) ×3 IMPLANT
WIRE J 3MM .035X145CM (WIRE) ×3 IMPLANT

## 2019-06-09 NOTE — Op Note (Addendum)
[name] Percutaneous Study/Intervention Procedural Note   Date: 06/09/2019  Surgeon(s): Leotis Pain, MD  Assistants: none  Pre-operative Diagnosis: 1.  Chronic Mesenteric ischemia 2. SMA stenosis   Post-operative diagnosis: Same with right renal artery stenosis and severe aortic stenosis just above the SMA  Procedure(s) Performed: 1. Ultrasound guidance for vascular access right femoral artery  2. Catheter placement into right renal artery and SMA  from right femoral approach 3. Aortogram and selective angiogram of the right renal artery and the SMA  4. PTA of the SMA with 5 mm diameter by 4 cm length Lutonix drug-coated angioplasty balloon 5. StarClose closure device right femoral artery  Contrast: 45 cc  Fluoro time: 6.6 minutes  EBL: 5 cc  Anesthesia: Approximately 30 minutes of Moderate conscious sedation using 2 mg of Versed and 50 mcg of Fentanyl  Indications: [name] who has symptoms consistent with mesenteric ischemia. The patient has a markedly elevated velocities in the aorta at the level of the SMA with elevated SMA velocities although it was difficult to discern with the aortic lesion. The patient is brought in for angiography for further evaluation and potential treatment. Risks and benefits are discussed and informed consent is obtained  Procedure: The patient was identified and appropriate procedural time out was performed. The patient was then placed supine on the table and prepped and draped in the usual sterile fashion. Moderate conscious sedation was administered during a face to face encounter with the patient throughout the procedure with my supervision of the RN administering medicines and monitoring the patient's vital signs, pulse oximetry, telemetry and mental status throughout from the start of the procedure until the patient was  taken to the recovery room. Ultrasound was used to evaluate the right common femoral artery. It was patent . A digital ultrasound image was acquired. A Seldinger needle was used to access the right common femoral artery under direct ultrasound guidance and a permanent image was performed. A 0.035 J wire was advanced without resistance and a 5Fr sheath was placed. Pigtail catheter was placed into the aorta and an AP aortogram and then a steep RAO projection image was performed. This demonstrated a huge chunk of calcium at and above the visceral vessels.  No flow was seen in the celiac artery.  The SMA stent had flow although it was impossible to discern if there was significant stenosis.  There also appeared to be worsening disease of the origin of the right renal artery with 70% or more stenosis suggested on the aortogram but again this was difficult to discern.   A Kumpe catheter was used to easily cannulate the right renal artery where selective imaging was performed.  This confirmed a 70 to 75% stenosis of the origin of the right renal artery.  The Kumpe catheter was used to selectively cannulate the SMA stent where selective imaging was performed of the SMA as well. This was extremely difficult image in large part due to the large calcific lesion and a high-grade stenosis in the aorta at and just above the level of the SMA.  The aortic lesion was at least 75 to 80%.  There appeared to be spillover disease at the origin of the SMA stent creating at least a 60% stenosis although image quality was very limited even with selective cannulation of the SMA for selective image.  I elected to go ahead and treat this lesion to see if it improves the situation.  Using a  0.018 wire and the Kumpe catheter I gained purchase and remove the diagnostic catheter.  The patient was heparinized.  A 5 mm diameter by 4 cm likely tonics drug-coated angioplasty balloon was inflated to 10 atm for 1 minute.  Only a mild degree of  waist was seen at the origin of the SMA stent but it did resolve with inflation.  Completion imaging showed what appeared to be only mild stenosis of the SMA stent although again image quality was extremely limited due to the aortic lesion. At this point, I elected to terminate the procedure. The diagnostic catheter was removed. StarClose closure device was deployed in usual fashion with excellent hemostatic result. The patient was taken to the recovery room in stable condition having tolerated the procedure well.     Findings:70 to 75% stenosis of the origin of the right renal artery.  Moderate appearing stenosis of the origin of the SMA stent due to overflow of the aortic disease which was high-grade in the 75 to 80% range at the level of the visceral vessels.  Disposition: Patient was taken to the recovery room in stable condition having tolerated the procedure well.  Complications: None  Leotis Pain 06/09/2019 12:37 PM   This note was created with Dragon Medical transcription system. Any errors in dictation are purely unintentional.

## 2019-06-09 NOTE — H&P (Signed)
[facility] History & Physical Update  The patient was interviewed and re-examined.  The patient's previous History and Physical has been reviewed and is unchanged.  There is no change in the plan of care. We plan to proceed with the scheduled procedure.  Leotis Pain, MD  06/09/2019, 10:34 AM

## 2019-06-12 DIAGNOSIS — R197 Diarrhea, unspecified: Secondary | ICD-10-CM | POA: Diagnosis not present

## 2019-06-12 DIAGNOSIS — I130 Hypertensive heart and chronic kidney disease with heart failure and stage 1 through stage 4 chronic kidney disease, or unspecified chronic kidney disease: Secondary | ICD-10-CM | POA: Diagnosis not present

## 2019-06-12 DIAGNOSIS — N179 Acute kidney failure, unspecified: Secondary | ICD-10-CM | POA: Diagnosis not present

## 2019-06-12 DIAGNOSIS — I509 Heart failure, unspecified: Secondary | ICD-10-CM | POA: Diagnosis not present

## 2019-06-12 DIAGNOSIS — E876 Hypokalemia: Secondary | ICD-10-CM | POA: Diagnosis not present

## 2019-06-12 DIAGNOSIS — I700 Atherosclerosis of aorta: Secondary | ICD-10-CM | POA: Diagnosis not present

## 2019-06-13 DIAGNOSIS — R197 Diarrhea, unspecified: Secondary | ICD-10-CM | POA: Diagnosis not present

## 2019-06-13 DIAGNOSIS — I130 Hypertensive heart and chronic kidney disease with heart failure and stage 1 through stage 4 chronic kidney disease, or unspecified chronic kidney disease: Secondary | ICD-10-CM | POA: Diagnosis not present

## 2019-06-13 DIAGNOSIS — E876 Hypokalemia: Secondary | ICD-10-CM | POA: Diagnosis not present

## 2019-06-13 DIAGNOSIS — I700 Atherosclerosis of aorta: Secondary | ICD-10-CM | POA: Diagnosis not present

## 2019-06-13 DIAGNOSIS — N179 Acute kidney failure, unspecified: Secondary | ICD-10-CM | POA: Diagnosis not present

## 2019-06-13 DIAGNOSIS — I509 Heart failure, unspecified: Secondary | ICD-10-CM | POA: Diagnosis not present

## 2019-06-17 ENCOUNTER — Encounter: Payer: Self-pay | Admitting: Cardiology

## 2019-06-17 DIAGNOSIS — R197 Diarrhea, unspecified: Secondary | ICD-10-CM | POA: Diagnosis not present

## 2019-06-17 DIAGNOSIS — N179 Acute kidney failure, unspecified: Secondary | ICD-10-CM | POA: Diagnosis not present

## 2019-06-17 DIAGNOSIS — I700 Atherosclerosis of aorta: Secondary | ICD-10-CM | POA: Diagnosis not present

## 2019-06-17 DIAGNOSIS — E876 Hypokalemia: Secondary | ICD-10-CM | POA: Diagnosis not present

## 2019-06-17 DIAGNOSIS — I130 Hypertensive heart and chronic kidney disease with heart failure and stage 1 through stage 4 chronic kidney disease, or unspecified chronic kidney disease: Secondary | ICD-10-CM | POA: Diagnosis not present

## 2019-06-17 DIAGNOSIS — I509 Heart failure, unspecified: Secondary | ICD-10-CM | POA: Diagnosis not present

## 2019-06-19 DIAGNOSIS — R0602 Shortness of breath: Secondary | ICD-10-CM | POA: Diagnosis not present

## 2019-06-19 DIAGNOSIS — Z955 Presence of coronary angioplasty implant and graft: Secondary | ICD-10-CM | POA: Diagnosis not present

## 2019-06-19 DIAGNOSIS — I10 Essential (primary) hypertension: Secondary | ICD-10-CM | POA: Diagnosis not present

## 2019-06-19 DIAGNOSIS — I2510 Atherosclerotic heart disease of native coronary artery without angina pectoris: Secondary | ICD-10-CM | POA: Diagnosis not present

## 2019-06-19 DIAGNOSIS — E7800 Pure hypercholesterolemia, unspecified: Secondary | ICD-10-CM | POA: Diagnosis not present

## 2019-06-25 ENCOUNTER — Other Ambulatory Visit: Payer: Self-pay | Admitting: Internal Medicine

## 2019-06-26 ENCOUNTER — Ambulatory Visit (INDEPENDENT_AMBULATORY_CARE_PROVIDER_SITE_OTHER): Payer: Medicare Other | Admitting: Internal Medicine

## 2019-06-26 ENCOUNTER — Encounter: Payer: Self-pay | Admitting: Internal Medicine

## 2019-06-26 VITALS — BP 140/70 | Ht 65.0 in | Wt 133.0 lb

## 2019-06-26 DIAGNOSIS — R739 Hyperglycemia, unspecified: Secondary | ICD-10-CM | POA: Diagnosis not present

## 2019-06-26 DIAGNOSIS — I5032 Chronic diastolic (congestive) heart failure: Secondary | ICD-10-CM

## 2019-06-26 DIAGNOSIS — E7800 Pure hypercholesterolemia, unspecified: Secondary | ICD-10-CM | POA: Diagnosis not present

## 2019-06-26 DIAGNOSIS — M21371 Foot drop, right foot: Secondary | ICD-10-CM | POA: Diagnosis not present

## 2019-06-26 DIAGNOSIS — I10 Essential (primary) hypertension: Secondary | ICD-10-CM

## 2019-06-26 DIAGNOSIS — I2510 Atherosclerotic heart disease of native coronary artery without angina pectoris: Secondary | ICD-10-CM

## 2019-06-26 DIAGNOSIS — J410 Simple chronic bronchitis: Secondary | ICD-10-CM | POA: Diagnosis not present

## 2019-06-26 DIAGNOSIS — K551 Chronic vascular disorders of intestine: Secondary | ICD-10-CM

## 2019-06-26 DIAGNOSIS — R197 Diarrhea, unspecified: Secondary | ICD-10-CM | POA: Diagnosis not present

## 2019-06-26 DIAGNOSIS — C679 Malignant neoplasm of bladder, unspecified: Secondary | ICD-10-CM

## 2019-06-26 DIAGNOSIS — I700 Atherosclerosis of aorta: Secondary | ICD-10-CM

## 2019-06-26 DIAGNOSIS — D45 Polycythemia vera: Secondary | ICD-10-CM

## 2019-06-26 DIAGNOSIS — N183 Chronic kidney disease, stage 3 unspecified: Secondary | ICD-10-CM

## 2019-06-26 NOTE — Progress Notes (Signed)
[name], female   DOB: 01-Aug-1934, 85 y.o.   MRN: [id number]   Virtual Visit via video Note  This visit type was conducted due to national recommendations for restrictions regarding the COVID-19 pandemic (e.g. social distancing).  This format is felt to be most appropriate for this patient at this time.  All issues noted in this document were discussed and addressed.  No physical exam was performed (except for noted visual exam findings with Video Visits).   I connected with Tina [name] by a video enabled telemedicine application and verified that I am speaking with the correct person using two identifiers. Location patient: home Location provider: work Persons participating in the virtual visit: patient, provider  I discussed the limitations, risks, security and privacy concerns of performing an evaluation and management service by video and the availability of in person appointments. The patient expressed understanding and agreed to proceed.   Reason for visit: scheduled follow up.    HPI: She reports she is doing relatively well.  Saw cardiology 06/19/19.  Felt stable.  Recommended f/u in 6 months.  No changes made.  Recently evaluated by [facility] - moderate stenosis - SMA stent - observe.  Continues to f/u with [facility].  Saw Dr Manuella Ghazi - 06/02/19 - [facility] - polyneuropathy.  On gabapentin.  Recommended physical therapy.  No chest pain.  Breathing overall stable. 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  Scheduled for f/u CT abdomen/pelvis - 11/12/19.  Had walk at home and states did not qualify for home O2. States blood pressures averaging 130-140/70-80. Still with bowel issues.  Probiotics did not help.  pepto bismol helps.  No abdominal pain.  No blood.      ROS: See pertinent positives and negatives per HPI.  Past Medical History:  Diagnosis Date  . Anemia   . Asthma   . Bladder cancer ([place])   . BRCA positive   . CAD (coronary artery disease)   .  CHF (congestive heart failure) ([place])   . COPD (chronic obstructive pulmonary disease) ([place])   . Emphysema lung ([place])   . GERD (gastroesophageal reflux disease)   . History of colon polyps   . Hypercholesterolemia   . Hyperglycemia   . Hypertension   . Lung nodules   . Osteoporosis   . Personal history of tobacco use, presenting hazards to health 11/25/2014  . Polycythemia vera(238.4)   . Renal cyst   . Skin cancer     Past Surgical History:  Procedure Laterality Date  . ANGIOPLASTY     coronary (x1)  . COLONOSCOPY WITH PROPOFOL N/A 03/02/2015   Procedure: COLONOSCOPY WITH PROPOFOL;  Surgeon: Lollie Sails, MD;  Location: [facility] ENDOSCOPY;  Service: Endoscopy;  Laterality: N/A;  . COLONOSCOPY WITH PROPOFOL N/A 02/13/2017   Procedure: COLONOSCOPY WITH PROPOFOL;  Surgeon: [name], MD;  Location: [facility] ENDOSCOPY;  Service: Endoscopy;  Laterality: N/A;  . CORONARY ANGIOPLASTY    . CORONARY ARTERY BYPASS GRAFT  09/17/2017   pt denies  . CYSTOSCOPY W/ RETROGRADES Bilateral 02/13/2017   Procedure: CYSTOSCOPY WITH RETROGRADE PYELOGRAM;  Surgeon: Hollice Espy, MD;  Location: [facility] ORS;  Service: Urology;  Laterality: Bilateral;  . CYSTOSCOPY W/ RETROGRADES Bilateral 09/19/2017   Procedure: CYSTOSCOPY WITH RETROGRADE PYELOGRAM;  Surgeon: Hollice Espy, MD;  Location: [facility] ORS;  Service: Urology;  Laterality: Bilateral;  . CYSTOSCOPY WITH BIOPSY  03/26/2017   Procedure: CYSTOSCOPY WITH BIOPSY;  Surgeon: Hollice Espy, MD;  Location: [facility] ORS;  Service: Urology;;  . Consuela Mimes WITH STENT PLACEMENT Bilateral 02/13/2017   Procedure: CYSTOSCOPY WITH STENT PLACEMENT;  Surgeon: Hollice Espy, MD;  Location: [facility] ORS;  Service: Urology;  Laterality: Bilateral;  . CYSTOSCOPY WITH STENT PLACEMENT Bilateral 03/26/2017   Procedure: CYSTOSCOPY WITH STENT EXCHANGE;  Surgeon: Hollice Espy, MD;  Location: [facility] ORS;  Service: Urology;  Laterality: Bilateral;  . ESOPHAGOGASTRODUODENOSCOPY (EGD)  WITH PROPOFOL N/A 02/13/2017   Procedure: ESOPHAGOGASTRODUODENOSCOPY (EGD) WITH PROPOFOL;  Surgeon: [name], MD;  Location: [facility] ENDOSCOPY;  Service: Endoscopy;  Laterality: N/A;  . PORTA CATH INSERTION N/A 02/28/2017   Procedure: PORTA CATH INSERTION;  Surgeon: Algernon Huxley, MD;  Location: [facility] CV LAB;  Service: Cardiovascular;  Laterality: N/A;  . PORTA CATH REMOVAL N/A 12/24/2017   Procedure: PORTA CATH REMOVAL;  Surgeon: Algernon Huxley, MD;  Location: [facility] CV LAB;  Service: Cardiovascular;  Laterality: N/A;  . SALPINGOOPHORECTOMY    . TONSILECTOMY/ADENOIDECTOMY WITH MYRINGOTOMY    . TONSILLECTOMY    . TRANSURETHRAL RESECTION OF BLADDER TUMOR N/A 02/13/2017   Procedure: TRANSURETHRAL RESECTION OF BLADDER TUMOR (TURBT);  Surgeon: Hollice Espy, MD;  Location: [facility] ORS;  Service: Urology;  Laterality: N/A;  . TUBAL LIGATION    . UPPER GI ENDOSCOPY  02/13/2017  . URETEROSCOPY Left 09/19/2017   Procedure: URETEROSCOPY;  Surgeon: Hollice Espy, MD;  Location: [facility] ORS;  Service: Urology;  Laterality: Left;  Marland Kitchen VISCERAL ANGIOGRAPHY N/A 01/23/2019   Procedure: VISCERAL ANGIOGRAPHY;  Surgeon: Algernon Huxley, MD;  Location: [facility];  Service: Cardiovascular;  Laterality: N/A;  . VISCERAL ANGIOGRAPHY N/A 06/09/2019   Procedure: VISCERAL ANGIOGRAPHY;  Surgeon: Algernon Huxley, MD;  Location: [facility];  Service: Cardiovascular;  Laterality: N/A;  . VISCERAL ARTERY INTERVENTION N/A 02/12/2017   Procedure: VISCERAL ARTERY INTERVENTION;  Surgeon: Algernon Huxley, MD;  Location: [facility];  Service: Cardiovascular;  Laterality: N/A;    Family History  Problem Relation Age of Onset  . Cirrhosis Father        died age 75  . Alcohol abuse Father   . Asthma Mother   . Congestive Heart Failure Mother   . Breast cancer Mother        2 (1/2 sisters)  . Osteoarthritis Mother   . Colon cancer Mother   . Lupus Sister   . Alcohol abuse Sister   . Ovarian cancer  Sister   . Osteoporosis Sister   . Skin cancer Sister   . Breast cancer Cousin   . Breast cancer Maternal Aunt     SOCIAL HX: reviewed.    Current Outpatient Medications:  .  cyanocobalamin 1000 MCG tablet, Take 2,000 mcg by mouth daily., Disp: , Rfl:  .  albuterol (PROVENTIL HFA;VENTOLIN HFA) 108 (90 Base) MCG/ACT inhaler, Inhale 2 puffs into the lungs every 6 (six) hours as needed for wheezing or shortness of breath., Disp: 1 Inhaler, Rfl: 2 .  amLODipine (NORVASC) 5 MG tablet, TAKE 1 TABLET BY MOUTH  TWICE DAILY (Patient taking differently: Take 5 mg by mouth 2 (two) times daily. ), Disp: 180 tablet, Rfl: 3 .  aspirin EC 81 MG tablet, Take 1 tablet (81 mg total) by mouth daily., Disp: 150 tablet, Rfl: 2 .  cholecalciferol (VITAMIN D) 1000 units tablet, Take 1,000 Units by mouth daily., Disp: , Rfl:  .  clopidogrel (PLAVIX) 75 MG tablet, Take 1 tablet (75 mg total) by mouth daily., Disp: 30 tablet,  Rfl: 0 .  fluticasone (FLONASE) 50 MCG/ACT nasal spray, USE 2 SPRAYS INTO BOTH  NOSTRILS DAILY AS NEEDED  FOR ALLERGIES. (Patient taking differently: Place 2 sprays into both nostrils daily. ), Disp: 48 g, Rfl: 1 .  gabapentin (NEURONTIN) 100 MG capsule, Take 2 capsules by mouth at bedtime., Disp: , Rfl:  .  hydrALAZINE (APRESOLINE) 25 MG tablet, TAKE 1 TABLET BY MOUTH 3  TIMES DAILY (Patient taking differently: Take 25 mg by mouth 3 (three) times daily. ), Disp: 270 tablet, Rfl: 3 .  ipratropium-albuterol (DUONEB) 0.5-2.5 (3) MG/3ML SOLN, Take 3 mLs by nebulization every 6 (six) hours as needed (for shortness of breath)., Disp: 36 mL, Rfl: 0 .  losartan (COZAAR) 100 MG tablet, TAKE 1 TABLET BY MOUTH ONCE DAILY (Patient taking differently: Take 100 mg by mouth daily. TAKE 1 TABLET BY MOUTH ONCE DAILY), Disp: 90 tablet, Rfl: 3 .  lovastatin (MEVACOR) 40 MG tablet, TAKE 1 TABLET BY MOUTH  DAILY IN THE EVENING, Disp: 90 tablet, Rfl: 3 .  metoprolol tartrate (LOPRESSOR) 25 MG tablet, TAKE 1 TABLET BY  MOUTH  TWICE DAILY (Patient taking differently: Take 25 mg by mouth 2 (two) times daily. ), Disp: 180 tablet, Rfl: 3 .  OVER THE COUNTER MEDICATION, daily., Disp: , Rfl:  .  potassium chloride (KLOR-CON) 10 MEQ tablet, TAKE 1 TABLET BY MOUTH  DAILY, Disp: 90 tablet, Rfl: 3 .  SYMBICORT 160-4.5 MCG/ACT inhaler, USE 2 PUFFS TWO TIMES DAILY, Disp: 30.6 g, Rfl: 3 .  torsemide (DEMADEX) 20 MG tablet, Take 20 mg by mouth every morning., Disp: , Rfl: 11 No current facility-administered medications for this visit.  Facility-Administered Medications Ordered in Other Visits:  .  heparin lock flush 100 unit/mL, 500 Units, Intravenous, Once, Sindy Guadeloupe, MD .  sodium chloride flush (NS) 0.9 % injection 10 mL, 10 mL, Intravenous, PRN, Sindy Guadeloupe, MD  EXAM:  VITALS per patient if applicable: [id number]  GENERAL: alert, oriented, appears well and in no acute distress  HEENT: atraumatic, conjunttiva clear, no obvious abnormalities on inspection of external nose and ears  NECK: normal movements of the head and neck  LUNGS: on inspection no signs of respiratory distress, breathing rate appears normal, no obvious gross SOB, gasping or wheezing  CV: no obvious cyanosis  PSYCH/NEURO: pleasant and cooperative, no obvious depression or anxiety, speech and thought processing grossly intact  ASSESSMENT AND PLAN:  Discussed the following assessment and plan:  Aortic atherosclerosis ([place]) On crestor.    CAD (coronary artery disease) Continue risk factor modification.  Followed by cardiology.    CHF (congestive heart failure) ([place]) Does not appear to be volume overloaded.  Continue current medication regimen.  Follow.    CKD (chronic kidney disease), stage III (HCC) Avoid antiinflammatories.  Follow metabolic panel.   COPD (chronic obstructive pulmonary disease) ([place]) Has been followed by Dr Raul Del.  Breathing better.  No increased cough or congestion.  Follow. Continue current inhaler  regimen.    Diarrhea Recent admission. C.diff negative.  Probiotic did not help.  pepto bismol helps.  Needs f/u with GI given persistent loose stool. Has scheduled for next week.   Essential hypertension Blood pressure under good control.  Continue same medication regimen.  Follow pressures.  Follow metabolic panel.    Foot drop Saw neurology.  Physical therapy. Follow.    Hypercholesterolemia On lovastatin.  Low cholesterol diet and exercise.  Follow lipid panel and liver function tests.    Hyperglycemia Low carb  diet and exercise.  Follow met b and a1c.   Mesenteric ischemia due to arterial insufficiency (HCC) Followed by [facility].    Polycythemia vera ([place]) Followed by hematology.   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    Orders Placed This Encounter  Procedures  . CBC with Differential/Platelet    Standing Status:   Future    Standing Expiration Date:   06/25/2020  . Hepatic function panel    Standing Status:   Future    Standing Expiration Date:   06/25/2020  . Hemoglobin A1c    Standing Status:   Future    Standing Expiration Date:   06/25/2020  . Lipid panel    Standing Status:   Future    Standing Expiration Date:   06/25/2020  . Basic metabolic panel    Standing Status:   Future    Standing Expiration Date:   06/25/2020    No orders of the defined types were placed in this encounter.    I discussed the assessment and treatment plan with the patient. The patient was provided an opportunity to ask questions and all were answered. The patient agreed with the plan and demonstrated an understanding of the instructions.   The patient was advised to call back or seek an in-person evaluation if the symptoms worsen or if the condition fails to improve as anticipated.   Einar Pheasant, MD

## 2019-06-29 ENCOUNTER — Encounter: Payer: Self-pay | Admitting: Internal Medicine

## 2019-06-29 NOTE — Assessment & Plan Note (Signed)
On lovastatin.  Low cholesterol diet and exercise.  Follow lipid panel and liver function tests.   

## 2019-06-29 NOTE — Assessment & Plan Note (Signed)
Low carb diet and exercise.  Follow met b and a1c.  

## 2019-06-29 NOTE — Assessment & Plan Note (Signed)
Avoid antiinflammatories.  Follow metabolic panel.  

## 2019-06-29 NOTE — Assessment & Plan Note (Signed)
Does not appear to be volume overloaded.  Continue current medication regimen.  Follow.

## 2019-06-29 NOTE — Assessment & Plan Note (Addendum)
Recent admission. C.diff negative.  Probiotic did not help.  pepto bismol helps.  Needs f/u with GI given persistent loose stool. Has scheduled for next week.

## 2019-06-29 NOTE — Assessment & Plan Note (Signed)
On crestor.   

## 2019-06-29 NOTE — Assessment & Plan Note (Signed)
Continue risk factor modification.  Followed by cardiology.  

## 2019-06-29 NOTE — Assessment & Plan Note (Signed)
Saw neurology.  Physical therapy. Follow.

## 2019-06-29 NOTE — Assessment & Plan Note (Signed)
Followed by urology and oncology

## 2019-06-29 NOTE — Assessment & Plan Note (Signed)
Has been followed by Dr Raul Del.  Breathing better.  No increased cough or congestion.  Follow. Continue current inhaler regimen.

## 2019-06-29 NOTE — Assessment & Plan Note (Signed)
Followed by hematology 

## 2019-06-29 NOTE — Assessment & Plan Note (Signed)
Followed by [facility].   

## 2019-06-29 NOTE — Assessment & Plan Note (Signed)
Blood pressure under good control.  Continue same medication regimen.  Follow pressures.  Follow metabolic panel.   

## 2019-06-30 ENCOUNTER — Other Ambulatory Visit: Payer: Self-pay | Admitting: Internal Medicine

## 2019-07-01 ENCOUNTER — Telehealth: Payer: Self-pay | Admitting: Internal Medicine

## 2019-07-01 ENCOUNTER — Other Ambulatory Visit: Payer: Self-pay

## 2019-07-01 ENCOUNTER — Ambulatory Visit (INDEPENDENT_AMBULATORY_CARE_PROVIDER_SITE_OTHER): Payer: Medicare Other | Admitting: Vascular Surgery

## 2019-07-01 ENCOUNTER — Encounter (INDEPENDENT_AMBULATORY_CARE_PROVIDER_SITE_OTHER): Payer: Self-pay | Admitting: Vascular Surgery

## 2019-07-01 VITALS — BP 146/65 | HR 74 | Resp 16 | Wt 135.6 lb

## 2019-07-01 DIAGNOSIS — I701 Atherosclerosis of renal artery: Secondary | ICD-10-CM | POA: Diagnosis not present

## 2019-07-01 DIAGNOSIS — I700 Atherosclerosis of aorta: Secondary | ICD-10-CM | POA: Diagnosis not present

## 2019-07-01 DIAGNOSIS — E7800 Pure hypercholesterolemia, unspecified: Secondary | ICD-10-CM | POA: Diagnosis not present

## 2019-07-01 DIAGNOSIS — I2510 Atherosclerotic heart disease of native coronary artery without angina pectoris: Secondary | ICD-10-CM | POA: Diagnosis not present

## 2019-07-01 DIAGNOSIS — N183 Chronic kidney disease, stage 3 unspecified: Secondary | ICD-10-CM

## 2019-07-01 DIAGNOSIS — I10 Essential (primary) hypertension: Secondary | ICD-10-CM | POA: Diagnosis not present

## 2019-07-01 DIAGNOSIS — K551 Chronic vascular disorders of intestine: Secondary | ICD-10-CM

## 2019-07-01 NOTE — Progress Notes (Signed)
MRN : [id number]  Tina [name] is a 85 y.o. (06/17/1934) female who presents with chief complaint of  Chief Complaint  Patient presents with   Follow-up    [facility] 3week post visceral angio  .  History of Present Illness: Patient returns today in follow up of aortic stenosis at the level of the visceral vessels and recent SMA intervention.  She is eating better and not losing weight.  She just feels tired and weak all the time mostly in her legs with activity.  She has a known high-grade lesion of the aorta which is in a difficult location at the level of the celiac artery and involves all the visceral vessels.  No periprocedural complications.  Current Outpatient Medications  Medication Sig Dispense Refill   albuterol (PROVENTIL HFA;VENTOLIN HFA) 108 (90 Base) MCG/ACT inhaler Inhale 2 puffs into the lungs every 6 (six) hours as needed for wheezing or shortness of breath. 1 Inhaler 2   amLODipine (NORVASC) 5 MG tablet TAKE 1 TABLET BY MOUTH  TWICE DAILY (Patient taking differently: Take 5 mg by mouth 2 (two) times daily. ) 180 tablet 3   aspirin EC 81 MG tablet Take 1 tablet (81 mg total) by mouth daily. 150 tablet 2   cholecalciferol (VITAMIN D) 1000 units tablet Take 1,000 Units by mouth daily.     clopidogrel (PLAVIX) 75 MG tablet Take 1 tablet (75 mg total) by mouth daily. 30 tablet 0   cyanocobalamin 1000 MCG tablet Take 2,000 mcg by mouth daily.     fluticasone (FLONASE) 50 MCG/ACT nasal spray USE 2 SPRAYS INTO BOTH  NOSTRILS DAILY AS NEEDED  FOR ALLERGIES. (Patient taking differently: Place 2 sprays into both nostrils daily. ) 48 g 1   gabapentin (NEURONTIN) 100 MG capsule Take 2 capsules by mouth at bedtime.     hydrALAZINE (APRESOLINE) 25 MG tablet TAKE 1 TABLET BY MOUTH 3  TIMES DAILY (Patient taking differently: Take 25 mg by mouth 3 (three) times daily. ) 270 tablet 3   ipratropium-albuterol (DUONEB) 0.5-2.5 (3) MG/3ML SOLN Take 3 mLs by nebulization every 6 (six) hours  as needed (for shortness of breath). 36 mL 0   losartan (COZAAR) 100 MG tablet TAKE 1 TABLET BY MOUTH ONCE DAILY (Patient taking differently: Take 100 mg by mouth daily. TAKE 1 TABLET BY MOUTH ONCE DAILY) 90 tablet 3   lovastatin (MEVACOR) 40 MG tablet TAKE 1 TABLET BY MOUTH  DAILY IN THE EVENING 90 tablet 3   metoprolol tartrate (LOPRESSOR) 25 MG tablet TAKE 1 TABLET BY MOUTH  TWICE DAILY (Patient taking differently: Take 25 mg by mouth 2 (two) times daily. ) 180 tablet 3   OVER THE COUNTER MEDICATION daily.     potassium chloride (KLOR-CON) 10 MEQ tablet TAKE 1 TABLET BY MOUTH  DAILY 90 tablet 3   SYMBICORT 160-4.5 MCG/ACT inhaler USE 2 PUFFS BY MOUTH TWO  TIMES DAILY 30.6 g 3   torsemide (DEMADEX) 20 MG tablet Take 20 mg by mouth every morning.  11   No current facility-administered medications for this visit.   Facility-Administered Medications Ordered in Other Visits  Medication Dose Route Frequency Provider Last Rate Last Admin   heparin lock flush 100 unit/mL  500 Units Intravenous Once Sindy Guadeloupe, MD       sodium chloride flush (NS) 0.9 % injection 10 mL  10 mL Intravenous PRN Sindy Guadeloupe, MD        Past Medical History:  Diagnosis Date  Anemia    Asthma    Bladder cancer (HCC)    BRCA positive    CAD (coronary artery disease)    CHF (congestive heart failure) (HCC)    COPD (chronic obstructive pulmonary disease) (HCC)    Emphysema lung (HCC)    GERD (gastroesophageal reflux disease)    History of colon polyps    Hypercholesterolemia    Hyperglycemia    Hypertension    Lung nodules    Osteoporosis    Personal history of tobacco use, presenting hazards to health 11/25/2014   Polycythemia vera(238.4)    Renal cyst    Skin cancer     Past Surgical History:  Procedure Laterality Date   ANGIOPLASTY     coronary (x1)   COLONOSCOPY WITH PROPOFOL N/A 03/02/2015   Procedure: COLONOSCOPY WITH PROPOFOL;  Surgeon: Lollie Sails, MD;   Location: [facility] ENDOSCOPY;  Service: Endoscopy;  Laterality: N/A;   COLONOSCOPY WITH PROPOFOL N/A 02/13/2017   Procedure: COLONOSCOPY WITH PROPOFOL;  Surgeon: [name], MD;  Location[facility] ENDOSCOPY;  Service: Endoscopy;  Laterality: N/A;   CORONARY ANGIOPLASTY     CORONARY ARTERY BYPASS GRAFT  09/17/2017   pt denies   CYSTOSCOPY W/ RETROGRADES Bilateral 02/13/2017   Procedure: CYSTOSCOPY WITH RETROGRADE PYELOGRAM;  Surgeon: Hollice Espy, MD;  Location: [facility] ORS;  Service: Urology;  Laterality: Bilateral;   CYSTOSCOPY W/ RETROGRADES Bilateral 09/19/2017   Procedure: CYSTOSCOPY WITH RETROGRADE PYELOGRAM;  Surgeon: Hollice Espy, MD;  Location: [facility] ORS;  Service: Urology;  Laterality: Bilateral;   CYSTOSCOPY WITH BIOPSY  03/26/2017   Procedure: CYSTOSCOPY WITH BIOPSY;  Surgeon: Hollice Espy, MD;  Location: [facility] ORS;  Service: Urology;;   CYSTOSCOPY WITH STENT PLACEMENT Bilateral 02/13/2017   Procedure: CYSTOSCOPY WITH STENT PLACEMENT;  Surgeon: Hollice Espy, MD;  Location: [facility] ORS;  Service: Urology;  Laterality: Bilateral;   CYSTOSCOPY WITH STENT PLACEMENT Bilateral 03/26/2017   Procedure: CYSTOSCOPY WITH STENT EXCHANGE;  Surgeon: Hollice Espy, MD;  Location: [facility] ORS;  Service: Urology;  Laterality: Bilateral;   ESOPHAGOGASTRODUODENOSCOPY (EGD) WITH PROPOFOL N/A 02/13/2017   Procedure: ESOPHAGOGASTRODUODENOSCOPY (EGD) WITH PROPOFOL;  Surgeon: [name], MD;  Location: [facility] ENDOSCOPY;  Service: Endoscopy;  Laterality: N/A;   PORTA CATH INSERTION N/A 02/28/2017   Procedure: PORTA CATH INSERTION;  Surgeon: Algernon Huxley, MD;  Location: [place] CV LAB;  Service: Cardiovascular;  Laterality: N/A;   PORTA CATH REMOVAL N/A 12/24/2017   Procedure: PORTA CATH REMOVAL;  Surgeon: Algernon Huxley, MD;  Location: [place] CV LAB;  Service: Cardiovascular;  Laterality: N/A;   SALPINGOOPHORECTOMY     TONSILECTOMY/ADENOIDECTOMY WITH MYRINGOTOMY     TONSILLECTOMY      TRANSURETHRAL RESECTION OF BLADDER TUMOR N/A 02/13/2017   Procedure: TRANSURETHRAL RESECTION OF BLADDER TUMOR (TURBT);  Surgeon: Hollice Espy, MD;  Location: [facility] ORS;  Service: Urology;  Laterality: N/A;   TUBAL LIGATION     UPPER GI ENDOSCOPY  02/13/2017   URETEROSCOPY Left 09/19/2017   Procedure: URETEROSCOPY;  Surgeon: Hollice Espy, MD;  Location: [facility] ORS;  Service: Urology;  Laterality: Left;   VISCERAL ANGIOGRAPHY N/A 01/23/2019   Procedure: VISCERAL ANGIOGRAPHY;  Surgeon: Algernon Huxley, MD;  Location: [place] CV LAB;  Service: Cardiovascular;  Laterality: N/A;   VISCERAL ANGIOGRAPHY N/A 06/09/2019   Procedure: VISCERAL ANGIOGRAPHY;  Surgeon: Algernon Huxley, MD;  Location: [place] CV LAB;  Service: Cardiovascular;  Laterality: N/A;   VISCERAL ARTERY INTERVENTION N/A 02/12/2017   Procedure: VISCERAL ARTERY INTERVENTION;  Surgeon:  Algernon Huxley, MD;  Location: [place] CV LAB;  Service: Cardiovascular;  Laterality: N/A;     Social History   Tobacco Use   Smoking status: Former Smoker    Packs/day: 1.00    Years: 45.00    Pack years: 45.00    Types: Cigarettes    Quit date: 11/11/2003    Years since quitting: 15.6   Smokeless tobacco: Never Used  Substance Use Topics   Alcohol use: Not Currently    Alcohol/week: 0.0 standard drinks   Drug use: No    Family History  Problem Relation Age of Onset   Cirrhosis Father        died age 44   Alcohol abuse Father    Asthma Mother    Congestive Heart Failure Mother    Breast cancer Mother        2 (1/2 sisters)   Osteoarthritis Mother    Colon cancer Mother    Lupus Sister    Alcohol abuse Sister    Ovarian cancer Sister    Osteoporosis Sister    Skin cancer Sister    Breast cancer Cousin    Breast cancer Maternal Aunt      Allergies  Allergen Reactions   Evista [Raloxifene] Other (See Comments)    Night sweats    Fluticasone-Salmeterol Other (See Comments)    Cough, "chokes  me"      REVIEW OF SYSTEMS(Negative unless checked)  Constitutional: '[x]' ??Weight loss '[]' ??Fever '[]' ??Chills Cardiac: '[]' ??Chest pain '[]' ??Chest pressure '[]' ??Palpitations '[]' ??Shortness of breath when laying flat '[]' ??Shortness of breath at rest '[x]' ??Shortness of breath with exertion. Vascular: '[x]' ??Pain in legs with walking '[]' ??Pain in legs at rest '[]' ??Pain in legs when laying flat '[x]' ??Claudication '[]' ??Pain in feet when walking '[]' ??Pain in feet at rest '[]' ??Pain in feet when laying flat '[]' ??History of DVT '[]' ??Phlebitis '[]' ??Swelling in legs '[]' ??Varicose veins '[]' ??Non-healing ulcers Pulmonary: '[]' ??Uses home oxygen '[]' ??Productive cough '[]' ??Hemoptysis '[]' ??Wheeze '[x]' ??COPD '[]' ??Asthma Neurologic: '[]' ??Dizziness '[]' ??Blackouts '[]' ??Seizures '[]' ??History of stroke '[]' ??History of TIA '[]' ??Aphasia '[]' ??Temporary blindness '[]' ??Dysphagia '[]' ??Weakness or numbness in arms '[]' ??Weakness or numbness in legs Musculoskeletal: '[x]' ??Arthritis '[]' ??Joint swelling '[]' ??Joint pain '[]' ??Low back pain Hematologic: '[]' ??Easy bruising '[]' ??Easy bleeding '[]' ??Hypercoagulable state '[]' ??Anemic  Gastrointestinal: '[]' ??Blood in stool '[]' ??Vomiting blood '[x]' ??Gastroesophageal reflux/heartburn '[x]' ??Abdominal pain Genitourinary: '[]' ??Chronic kidney disease '[]' ??Difficult urination '[]' ??Frequent urination '[]' ??Burning with urination '[]' ??Hematuria Skin: '[]' ??Rashes '[]' ??Ulcers '[]' ??Wounds Psychological: '[]' ??History of anxiety '[]' ??History of major depression.  Physical Examination  BP (!) 146/65 (BP Location: Right Arm)    Pulse 74    Resp 16    Wt 135 lb 9.6 oz (61.5 kg)    BMI 22.57 kg/m  Gen:  WD/WN, NAD Head: [place]/AT, No temporalis wasting. Ear/Nose/Throat: Hearing grossly intact, nares w/o erythema or drainage Eyes: Conjunctiva clear. Sclera non-icteric Neck: Supple.  Trachea midline Pulmonary:  Good air movement, no use of accessory muscles.  Cardiac:  RRR, no JVD Vascular:  Vessel Right Left  Radial Palpable Palpable                          PT Palpable Palpable  DP Palpable Palpable   Gastrointestinal: soft, non-tender/non-distended. No guarding/reflex.  Musculoskeletal: M/S 5/5 throughout.  No deformity or atrophy.  No edema. Neurologic: Sensation grossly intact in extremities.  Symmetrical.  Speech is fluent.  Psychiatric: Judgment intact, Mood & affect appropriate for pt's clinical situation. Dermatologic: No rashes or ulcers noted.  No cellulitis or open wounds.       Labs Recent Results (from the past 2160 hour(s))  Urinalysis, Complete w Microscopic     Status: Abnormal   Collection Time: 05/10/19 12:15 PM  Result Value Ref Range   Color, Urine YELLOW (A) YELLOW   APPearance CLEAR (A) CLEAR   Specific Gravity, Urine 1.011 1.005 - 1.030   pH 5.0 5.0 - 8.0   Glucose, UA NEGATIVE NEGATIVE mg/dL   Hgb urine dipstick NEGATIVE NEGATIVE   Bilirubin Urine NEGATIVE NEGATIVE   Ketones, ur NEGATIVE NEGATIVE mg/dL   Protein, ur NEGATIVE NEGATIVE mg/dL   Nitrite NEGATIVE NEGATIVE   Leukocytes,Ua NEGATIVE NEGATIVE   RBC / HPF 0-5 0 - 5 RBC/hpf   WBC, UA 0-5 0 - 5 WBC/hpf   Bacteria, UA RARE (A) NONE SEEN   Squamous Epithelial / LPF 0-5 0 - 5   Mucus PRESENT    Hyaline Casts, UA PRESENT    Cellular Cast, UA PRESENT     Comment: Performed at [facility], [address] [place], [place] [address]  Lipase, blood     Status: None   Collection Time: 05/10/19 12:24 PM  Result Value Ref Range   Lipase 23 11 - 51 U/L    Comment: Performed at [facility], [address] [place], [place] [address]  Comprehensive metabolic panel     Status: Abnormal   Collection Time: 05/10/19 12:24 PM  Result Value Ref Range   Sodium 143 135 - 145 mmol/L   Potassium 2.9 (L) 3.5 - 5.1 mmol/L   Chloride 104 98 - 111 mmol/L   CO2 21 (L) 22 - 32 mmol/L   Glucose, Bld 116 (H) 70 - 99 mg/dL   BUN 41 (H) 8 - 23 mg/dL    Creatinine, Ser 2.71 (H) 0.44 - 1.00 mg/dL   Calcium 9.6 8.9 - 10.3 mg/dL   Total Protein 7.7 6.5 - 8.1 g/dL   Albumin 4.5 3.5 - 5.0 g/dL   AST 29 15 - 41 U/L   ALT 23 0 - 44 U/L   Alkaline Phosphatase 54 38 - 126 U/L   Total Bilirubin 1.2 0.3 - 1.2 mg/dL   GFR calc non Af Amer 16 (L) >60 mL/min   GFR calc Af Amer 18 (L) >60 mL/min   Anion gap 18 (H) 5 - 15    Comment: Performed at [facility], [address] [place], [place] [address]  CBC     Status: Abnormal   Collection Time: 05/10/19 12:24 PM  Result Value Ref Range   WBC 9.1 4.0 - 10.5 K/uL   RBC 4.98 3.87 - 5.11 MIL/uL   Hemoglobin 15.2 (H) 12.0 - 15.0 g/dL   HCT 43.0 36.0 - 46.0 %   MCV 86.3 80.0 - 100.0 fL   MCH 30.5 26.0 - 34.0 pg   MCHC 35.3 30.0 - 36.0 g/dL   RDW 13.2 11.5 - 15.5 %   Platelets 247 150 - 400 K/uL   nRBC 0.0 0.0 - 0.2 %    Comment: Performed at [facility], [address] [place], [place] [address]  Magnesium     Status: None   Collection Time: 05/10/19 12:24 PM  Result Value Ref Range   Magnesium 2.2 1.7 - 2.4 mg/dL    Comment: Performed at [facility], [address] [place], [place] [address]  Respiratory Panel by RT PCR (Flu A&B, Covid) - Nasopharyngeal Swab     Status: None   Collection Time: 05/10/19  5:13 PM   Specimen: Nasopharyngeal Swab  Result Value Ref Range  SARS Coronavirus 2 by RT PCR NEGATIVE NEGATIVE    Comment: (NOTE) SARS-CoV-2 target nucleic acids are NOT DETECTED. The SARS-CoV-2 RNA is generally detectable in upper respiratoy specimens during the acute phase of infection. The lowest concentration of SARS-CoV-2 viral copies this assay can detect is 131 copies/mL. A negative result does not preclude SARS-Cov-2 infection and should not be used as the sole basis for treatment or other patient management decisions. A negative result may occur with  improper specimen collection/handling, submission of specimen other than nasopharyngeal swab,  presence of viral mutation(s) within the areas targeted by this assay, and inadequate number of viral copies (<131 copies/mL). A negative result must be combined with clinical observations, patient history, and epidemiological information. The expected result is Negative. Fact Sheet for Patients:  PinkCheek.be Fact Sheet for Healthcare Providers:  GravelBags.it This test is not yet ap proved or cleared by the [place] FDA and  has been authorized for detection and/or diagnosis of SARS-CoV-2 by FDA under an Emergency Use Authorization (EUA). This EUA will remain  in effect (meaning this test can be used) for the duration of the COVID-19 declaration under Section 564(b)(1) of the Act, 21 U.S.C. section 360bbb-3(b)(1), unless the authorization is terminated or revoked sooner.    Influenza A by PCR NEGATIVE NEGATIVE   Influenza B by PCR NEGATIVE NEGATIVE    Comment: (NOTE) The Xpert Xpress SARS-CoV-2/FLU/RSV assay is intended as an aid in  the diagnosis of influenza from Nasopharyngeal swab specimens and  should not be used as a sole basis for treatment. Nasal washings and  aspirates are unacceptable for Xpert Xpress SARS-CoV-2/FLU/RSV  testing. Fact Sheet for Patients: PinkCheek.be Fact Sheet for Healthcare Providers: GravelBags.it This test is not yet approved or cleared by the [place] FDA and  has been authorized for detection and/or diagnosis of SARS-CoV-2 by  FDA under an Emergency Use Authorization (EUA). This EUA will remain  in effect (meaning this test can be used) for the duration of the  Covid-19 declaration under Section 564(b)(1) of the Act, 21  U.S.C. section 360bbb-3(b)(1), unless the authorization is  terminated or revoked. Performed at [facility], [address] [place], [place] [address]   GI pathogen panel by PCR, stool      Status: None   Collection Time: 05/11/19  4:41 AM   Specimen: Stool  Result Value Ref Range   Plesiomonas shigelloides NOT DETECTED NOT DETECTED   Yersinia enterocolitica NOT DETECTED NOT DETECTED   Vibrio NOT DETECTED NOT DETECTED   Enteropathogenic E coli NOT DETECTED NOT DETECTED   E coli (ETEC) LT/ST NOT DETECTED NOT DETECTED   E coli 7026 by PCR Not applicable NOT DETECTED   Cryptosporidium by PCR NOT DETECTED NOT DETECTED   Entamoeba histolytica NOT DETECTED NOT DETECTED   Adenovirus F 40/41 NOT DETECTED NOT DETECTED   Norovirus GI/GII NOT DETECTED NOT DETECTED   Sapovirus NOT DETECTED NOT DETECTED    Comment: (NOTE) Performed At: [facility] [place], [place] [id number] Rush Farmer MD DX:[id number]    Vibrio cholerae NOT DETECTED NOT DETECTED   Campylobacter by PCR NOT DETECTED NOT DETECTED   Salmonella by PCR NOT DETECTED NOT DETECTED   E coli (STEC) NOT DETECTED NOT DETECTED   Enteroaggregative E coli NOT DETECTED NOT DETECTED   Shigella by PCR NOT DETECTED NOT DETECTED   Cyclospora cayetanensis NOT DETECTED NOT DETECTED   Astrovirus NOT DETECTED NOT DETECTED   G lamblia by PCR NOT DETECTED  NOT DETECTED   Rotavirus A by PCR NOT DETECTED NOT DETECTED  C difficile quick scan w PCR reflex     Status: None   Collection Time: 05/11/19  4:41 AM   Specimen: STOOL  Result Value Ref Range   C Diff antigen NEGATIVE NEGATIVE   C Diff toxin NEGATIVE NEGATIVE   C Diff interpretation No C. difficile detected.     Comment: Performed at [facility], [address] [place], [place] [address]  TSH     Status: None   Collection Time: 05/11/19  5:19 AM  Result Value Ref Range   TSH 1.919 0.350 - 4.500 uIU/mL    Comment: Performed by a 3rd Generation assay with a functional sensitivity of <=0.01 uIU/mL. Performed at [facility], [address] [place], [place] [address]   Basic metabolic panel     Status: Abnormal   Collection Time:  05/11/19  5:19 AM  Result Value Ref Range   Sodium 141 135 - 145 mmol/L   Potassium 3.2 (L) 3.5 - 5.1 mmol/L   Chloride 109 98 - 111 mmol/L   CO2 21 (L) 22 - 32 mmol/L   Glucose, Bld 112 (H) 70 - 99 mg/dL   BUN 32 (H) 8 - 23 mg/dL   Creatinine, Ser 1.72 (H) 0.44 - 1.00 mg/dL   Calcium 8.6 (L) 8.9 - 10.3 mg/dL   GFR calc non Af Amer 27 (L) >60 mL/min   GFR calc Af Amer 32 (L) >60 mL/min   Anion gap 11 5 - 15    Comment: Performed at [facility], [address] [place], [place] [address]  CBC     Status: None   Collection Time: 05/11/19  5:19 AM  Result Value Ref Range   WBC 8.2 4.0 - 10.5 K/uL   RBC 4.17 3.87 - 5.11 MIL/uL   Hemoglobin 12.7 12.0 - 15.0 g/dL   HCT 36.1 36.0 - 46.0 %   MCV 86.6 80.0 - 100.0 fL   MCH 30.5 26.0 - 34.0 pg   MCHC 35.2 30.0 - 36.0 g/dL   RDW 13.3 11.5 - 15.5 %   Platelets 195 150 - 400 K/uL   nRBC 0.0 0.0 - 0.2 %    Comment: Performed at [facility], [address] [place], [place] [address]  Phosphorus     Status: Abnormal   Collection Time: 05/11/19  5:19 AM  Result Value Ref Range   Phosphorus 2.4 (L) 2.5 - 4.6 mg/dL    Comment: Performed at [facility], [address] [place],  [address]  Glucose, capillary     Status: Abnormal   Collection Time: 05/11/19  8:07 AM  Result Value Ref Range   Glucose-Capillary 105 (H) 70 - 99 mg/dL   Comment 1 Notify RN   Basic metabolic panel     Status: Abnormal   Collection Time: 05/12/19  7:13 AM  Result Value Ref Range   Sodium 143 135 - 145 mmol/L   Potassium 3.2 (L) 3.5 - 5.1 mmol/L   Chloride 114 (H) 98 - 111 mmol/L   CO2 21 (L) 22 - 32 mmol/L   Glucose, Bld 98 70 - 99 mg/dL   BUN 17 8 - 23 mg/dL   Creatinine, Ser 0.87 0.44 - 1.00 mg/dL   Calcium 8.4 (L) 8.9 - 10.3 mg/dL   GFR calc non Af Amer >60 >60 mL/min   GFR calc Af Amer >60 >60 mL/min   Anion gap  8 5 - 15    Comment: Performed at [facility], [address] [place], [place] [address]    Magnesium     Status: None   Collection Time: 05/12/19  7:13 AM  Result Value Ref Range   Magnesium 1.8 1.7 - 2.4 mg/dL    Comment: Performed at [facility], [address] [place], [place] [address]  Phosphorus     Status: Abnormal   Collection Time: 05/12/19  7:13 AM  Result Value Ref Range   Phosphorus 2.1 (L) 2.5 - 4.6 mg/dL    Comment: Performed at [facility], [address] [place], [place] [address]  Basic metabolic panel     Status: Abnormal   Collection Time: 05/13/19  7:55 AM  Result Value Ref Range   Sodium 142 135 - 145 mmol/L   Potassium 2.8 (L) 3.5 - 5.1 mmol/L   Chloride 108 98 - 111 mmol/L   CO2 23 22 - 32 mmol/L   Glucose, Bld 108 (H) 70 - 99 mg/dL   BUN 12 8 - 23 mg/dL   Creatinine, Ser 0.81 0.44 - 1.00 mg/dL   Calcium 8.3 (L) 8.9 - 10.3 mg/dL   GFR calc non Af Amer >60 >60 mL/min   GFR calc Af Amer >60 >60 mL/min   Anion gap 11 5 - 15    Comment: Performed at [facility], [address] [place], [place] [address]  Magnesium     Status: None   Collection Time: 05/13/19  7:55 AM  Result Value Ref Range   Magnesium 1.7 1.7 - 2.4 mg/dL    Comment: Performed at [facility], [address] [place], [place] [address]  Phosphorus     Status: None   Collection Time: 05/13/19  7:55 AM  Result Value Ref Range   Phosphorus 2.9 2.5 - 4.6 mg/dL    Comment: Performed at [facility], [address] [place], [place] [address]  Glucose, capillary     Status: Abnormal   Collection Time: 05/13/19  8:24 AM  Result Value Ref Range   Glucose-Capillary 102 (H) 70 - 99 mg/dL  Potassium     Status: Abnormal   Collection Time: 05/13/19  2:51 PM  Result Value Ref Range   Potassium 3.3 (L) 3.5 - 5.1 mmol/L    Comment: Performed at [facility], [address] [place], [place] [address]  Comprehensive metabolic panel     Status: Abnormal   Collection Time: 05/26/19 11:21 AM  Result Value Ref Range   Sodium  135 135 - 145 mmol/L   Potassium 3.4 (L) 3.5 - 5.1 mmol/L   Chloride 91 (L) 98 - 111 mmol/L   CO2 29 22 - 32 mmol/L   Glucose, Bld 173 (H) 70 - 99 mg/dL   BUN 38 (H) 8 - 23 mg/dL   Creatinine, Ser 1.21 (H) 0.44 - 1.00 mg/dL   Calcium 9.6 8.9 - 10.3 mg/dL   Total Protein 7.4 6.5 - 8.1 g/dL   Albumin 4.3 3.5 - 5.0 g/dL   AST 19 15 - 41 U/L   ALT 21 0 - 44 U/L   Alkaline Phosphatase 57 38 - 126 U/L   Total Bilirubin 1.0 0.3 - 1.2 mg/dL   GFR calc non Af Amer 42 (L) >60 mL/min   GFR calc Af Amer 48 (L) >60 mL/min   Anion gap 15 5 - 15    Comment: Performed at [facility], [address] [place], [place] [address]  CBC with Differential/Platelet     Status: Abnormal   Collection Time: 05/26/19 11:21 AM  Result Value Ref Range   WBC 16.7 (H) 4.0 - 10.5 K/uL   RBC 5.13 (H) 3.87 - 5.11 MIL/uL   Hemoglobin 15.5 (H) 12.0 - 15.0 g/dL   HCT 46.5 (H) 36.0 - 46.0 %   MCV 90.6 80.0 - 100.0 fL   MCH 30.2 26.0 - 34.0 pg   MCHC 33.3 30.0 - 36.0 g/dL   RDW 12.9 11.5 - 15.5 %   Platelets 338 150 - 400 K/uL   nRBC 0.0 0.0 - 0.2 %   Neutrophils Relative % 91 %   Neutro Abs 15.1 (H) 1.7 - 7.7 K/uL   Lymphocytes Relative 5 %   Lymphs Abs 0.8 0.7 - 4.0 K/uL   Monocytes Relative 3 %   Monocytes Absolute 0.5 0.1 - 1.0 K/uL   Eosinophils Relative 0 %   Eosinophils Absolute 0.0 0.0 - 0.5 K/uL   Basophils Relative 0 %   Basophils Absolute 0.0 0.0 - 0.1 K/uL   Immature Granulocytes 1 %   Abs Immature Granulocytes 0.21 (H) 0.00 - 0.07 K/uL    Comment: Performed at [facility], [place], [place] [address]  SARS CORONAVIRUS 2 (TAT 6-24 HRS) Nasopharyngeal Nasopharyngeal Swab     Status: None   Collection Time: 06/05/19  2:32 PM   Specimen: Nasopharyngeal Swab  Result Value Ref Range   SARS Coronavirus 2 NEGATIVE NEGATIVE    Comment: (NOTE) SARS-CoV-2 target nucleic acids are NOT DETECTED. The SARS-CoV-2 RNA is generally detectable in upper and lower respiratory  specimens during the acute phase of infection. Negative results do not preclude SARS-CoV-2 infection, do not rule out co-infections with other pathogens, and should not be used as the sole basis for treatment or other patient management decisions. Negative results must be combined with clinical observations, patient history, and epidemiological information. The expected result is Negative. Fact Sheet for Patients: SugarRoll.be Fact Sheet for Healthcare Providers: https://www.woods-mathews.com/ This test is not yet approved or cleared by the [place] FDA and  has been authorized for detection and/or diagnosis of SARS-CoV-2 by FDA under an Emergency Use Authorization (EUA). This EUA will remain  in effect (meaning this test can be used) for the duration of the COVID-19 declaration under Section 56 4(b)(1) of the Act, 21 U.S.C. section 360bbb-3(b)(1), unless the authorization is terminated or revoked sooner. Performed at [facility] Lab, Nadine [address], [place], [place] [address]   BUN     Status: None   Collection Time: 06/09/19 10:48 AM  Result Value Ref Range   BUN 20 8 - 23 mg/dL    Comment: Performed at [facility], [address] [place], [place] [address]  Creatinine, serum     Status: Abnormal   Collection Time: 06/09/19 10:48 AM  Result Value Ref Range   Creatinine, Ser 1.08 (H) 0.44 - 1.00 mg/dL   GFR calc non Af Amer 48 (L) >60 mL/min   GFR calc Af Amer 55 (L) >60 mL/min    Comment: Performed at [facility], [address] [place], [place] [address]    Radiology PERIPHERAL VASCULAR CATHETERIZATION  Result Date: 06/09/2019 See op note   Assessment/Plan Essential hypertension blood pressure control important in reducing the progression of atherosclerotic disease. On appropriate oral medications.   CAD (coronary artery disease) Continue cardiac and antihypertensive medications as already  ordered and  reviewed, no changes at this time. Continue statin as ordered and reviewed, no changes at this time Nitrates PRN for chest pain  Hypercholesterolemia lipid control important in reducing the progression of atherosclerotic disease. Continue statin therapy   CKD (chronic kidney disease), stage III (HCC) Hydrate and limit contrast with procedure  Renal artery stenosis (HCC) Check duplex on next visit. Seen on angiogram  Mesenteric ischemia due to arterial insufficiency (HCC) Eating better and not losing weight.  SMA stent had recent intervention.  Aortic atherosclerosis ([place]) This is a primary problem the supersedes the renal and mesenteric artery stenosis as this is the inflow for this.  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  I do think it is playing a contributing role in her lower extremity symptoms as well.  There is not an easy endovascular fix for this and an open surgical therapy would be a highly morbid surgery that she is not interested in at this point.  I discussed that if she is absolutely miserable that is something we can consider going forward, but this is a relatively high risk surgery with a 5 to 10% morbidity and mortality at least.  For now, we will continue conservative therapy and I will see her back in 3 months.    Leotis Pain, MD  07/01/2019 2:20 PM    This note was created with Dragon medical transcription system.  Any errors from dictation are purely unintentional

## 2019-07-01 NOTE — Telephone Encounter (Signed)
LMTCB an Schedule a follow up appt in April and labs before appt

## 2019-07-01 NOTE — Assessment & Plan Note (Signed)
Check duplex on next visit. Seen on angiogram

## 2019-07-01 NOTE — Assessment & Plan Note (Signed)
This is a primary problem the supersedes the renal and mesenteric artery stenosis as this is the inflow for this.  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  I do think it is playing a contributing role in her lower extremity symptoms as well.  There is not an easy endovascular fix for this and an open surgical therapy would be a highly morbid surgery that she is not interested in at this point.  I discussed that if she is absolutely miserable that is something we can consider going forward, but this is a relatively high risk surgery with a 5 to 10% morbidity and mortality at least.  For now, we will continue conservative therapy and I will see her back in 3 months.

## 2019-07-01 NOTE — Assessment & Plan Note (Signed)
Eating better and not losing weight.  SMA stent had recent intervention.

## 2019-07-02 DIAGNOSIS — N183 Chronic kidney disease, stage 3 unspecified: Secondary | ICD-10-CM | POA: Diagnosis not present

## 2019-07-02 DIAGNOSIS — K551 Chronic vascular disorders of intestine: Secondary | ICD-10-CM | POA: Diagnosis not present

## 2019-07-02 DIAGNOSIS — Z955 Presence of coronary angioplasty implant and graft: Secondary | ICD-10-CM | POA: Diagnosis not present

## 2019-07-02 DIAGNOSIS — I10 Essential (primary) hypertension: Secondary | ICD-10-CM | POA: Diagnosis not present

## 2019-07-02 DIAGNOSIS — R197 Diarrhea, unspecified: Secondary | ICD-10-CM | POA: Diagnosis not present

## 2019-07-02 DIAGNOSIS — I5031 Acute diastolic (congestive) heart failure: Secondary | ICD-10-CM | POA: Diagnosis not present

## 2019-07-02 DIAGNOSIS — Z8719 Personal history of other diseases of the digestive system: Secondary | ICD-10-CM | POA: Diagnosis not present

## 2019-07-02 DIAGNOSIS — Z8551 Personal history of malignant neoplasm of bladder: Secondary | ICD-10-CM | POA: Diagnosis not present

## 2019-07-02 DIAGNOSIS — J449 Chronic obstructive pulmonary disease, unspecified: Secondary | ICD-10-CM | POA: Diagnosis not present

## 2019-07-04 ENCOUNTER — Other Ambulatory Visit: Payer: Self-pay | Admitting: Internal Medicine

## 2019-07-07 DIAGNOSIS — R197 Diarrhea, unspecified: Secondary | ICD-10-CM | POA: Diagnosis not present

## 2019-08-02 ENCOUNTER — Other Ambulatory Visit: Payer: Self-pay

## 2019-08-02 ENCOUNTER — Inpatient Hospital Stay
Admission: EM | Admit: 2019-08-02 | Discharge: 2019-08-06 | DRG: 190 | Disposition: A | Discharge status: Home / Self Care | Payer: Medicare Other | Attending: Internal Medicine | Admitting: Internal Medicine

## 2019-08-02 ENCOUNTER — Encounter: Payer: Self-pay | Admitting: Emergency Medicine

## 2019-08-02 ENCOUNTER — Emergency Department: Payer: Medicare Other

## 2019-08-02 DIAGNOSIS — I2510 Atherosclerotic heart disease of native coronary artery without angina pectoris: Secondary | ICD-10-CM | POA: Diagnosis present

## 2019-08-02 DIAGNOSIS — E785 Hyperlipidemia, unspecified: Secondary | ICD-10-CM | POA: Diagnosis present

## 2019-08-02 DIAGNOSIS — J439 Emphysema, unspecified: Principal | ICD-10-CM | POA: Diagnosis present

## 2019-08-02 DIAGNOSIS — Z800 Family history of malignant neoplasm of digestive organs: Secondary | ICD-10-CM

## 2019-08-02 DIAGNOSIS — I110 Hypertensive heart disease with heart failure: Secondary | ICD-10-CM | POA: Diagnosis present

## 2019-08-02 DIAGNOSIS — Z87891 Personal history of nicotine dependence: Secondary | ICD-10-CM

## 2019-08-02 DIAGNOSIS — D638 Anemia in other chronic diseases classified elsewhere: Secondary | ICD-10-CM | POA: Diagnosis present

## 2019-08-02 DIAGNOSIS — G629 Polyneuropathy, unspecified: Secondary | ICD-10-CM | POA: Diagnosis present

## 2019-08-02 DIAGNOSIS — Z85828 Personal history of other malignant neoplasm of skin: Secondary | ICD-10-CM

## 2019-08-02 DIAGNOSIS — Z79899 Other long term (current) drug therapy: Secondary | ICD-10-CM

## 2019-08-02 DIAGNOSIS — Z808 Family history of malignant neoplasm of other organs or systems: Secondary | ICD-10-CM

## 2019-08-02 DIAGNOSIS — J441 Chronic obstructive pulmonary disease with (acute) exacerbation: Secondary | ICD-10-CM | POA: Diagnosis present

## 2019-08-02 DIAGNOSIS — J9601 Acute respiratory failure with hypoxia: Secondary | ICD-10-CM | POA: Diagnosis present

## 2019-08-02 DIAGNOSIS — Z8262 Family history of osteoporosis: Secondary | ICD-10-CM | POA: Diagnosis not present

## 2019-08-02 DIAGNOSIS — N179 Acute kidney failure, unspecified: Secondary | ICD-10-CM | POA: Diagnosis not present

## 2019-08-02 DIAGNOSIS — E7800 Pure hypercholesterolemia, unspecified: Secondary | ICD-10-CM | POA: Diagnosis present

## 2019-08-02 DIAGNOSIS — Z955 Presence of coronary angioplasty implant and graft: Secondary | ICD-10-CM

## 2019-08-02 DIAGNOSIS — Z825 Family history of asthma and other chronic lower respiratory diseases: Secondary | ICD-10-CM | POA: Diagnosis not present

## 2019-08-02 DIAGNOSIS — I10 Essential (primary) hypertension: Secondary | ICD-10-CM | POA: Diagnosis present

## 2019-08-02 DIAGNOSIS — R269 Unspecified abnormalities of gait and mobility: Secondary | ICD-10-CM

## 2019-08-02 DIAGNOSIS — M810 Age-related osteoporosis without current pathological fracture: Secondary | ICD-10-CM | POA: Diagnosis present

## 2019-08-02 DIAGNOSIS — Z832 Family history of diseases of the blood and blood-forming organs and certain disorders involving the immune mechanism: Secondary | ICD-10-CM

## 2019-08-02 DIAGNOSIS — N183 Chronic kidney disease, stage 3 unspecified: Secondary | ICD-10-CM | POA: Diagnosis present

## 2019-08-02 DIAGNOSIS — N184 Chronic kidney disease, stage 4 (severe): Secondary | ICD-10-CM | POA: Diagnosis not present

## 2019-08-02 DIAGNOSIS — K219 Gastro-esophageal reflux disease without esophagitis: Secondary | ICD-10-CM | POA: Diagnosis present

## 2019-08-02 DIAGNOSIS — Z20822 Contact with and (suspected) exposure to covid-19: Secondary | ICD-10-CM | POA: Diagnosis present

## 2019-08-02 DIAGNOSIS — Z7951 Long term (current) use of inhaled steroids: Secondary | ICD-10-CM

## 2019-08-02 DIAGNOSIS — Z7902 Long term (current) use of antithrombotics/antiplatelets: Secondary | ICD-10-CM

## 2019-08-02 DIAGNOSIS — Z8551 Personal history of malignant neoplasm of bladder: Secondary | ICD-10-CM | POA: Diagnosis not present

## 2019-08-02 DIAGNOSIS — I5031 Acute diastolic (congestive) heart failure: Secondary | ICD-10-CM | POA: Diagnosis not present

## 2019-08-02 DIAGNOSIS — I701 Atherosclerosis of renal artery: Secondary | ICD-10-CM | POA: Diagnosis present

## 2019-08-02 DIAGNOSIS — I5033 Acute on chronic diastolic (congestive) heart failure: Secondary | ICD-10-CM | POA: Diagnosis not present

## 2019-08-02 DIAGNOSIS — Z803 Family history of malignant neoplasm of breast: Secondary | ICD-10-CM

## 2019-08-02 DIAGNOSIS — Z8249 Family history of ischemic heart disease and other diseases of the circulatory system: Secondary | ICD-10-CM | POA: Diagnosis not present

## 2019-08-02 DIAGNOSIS — Z66 Do not resuscitate: Secondary | ICD-10-CM | POA: Diagnosis present

## 2019-08-02 DIAGNOSIS — I5032 Chronic diastolic (congestive) heart failure: Secondary | ICD-10-CM | POA: Diagnosis present

## 2019-08-02 DIAGNOSIS — R0602 Shortness of breath: Secondary | ICD-10-CM | POA: Diagnosis not present

## 2019-08-02 DIAGNOSIS — Z7982 Long term (current) use of aspirin: Secondary | ICD-10-CM

## 2019-08-02 DIAGNOSIS — Q271 Congenital renal artery stenosis: Secondary | ICD-10-CM | POA: Diagnosis not present

## 2019-08-02 DIAGNOSIS — Z86010 Personal history of colonic polyps: Secondary | ICD-10-CM

## 2019-08-02 DIAGNOSIS — R0600 Dyspnea, unspecified: Secondary | ICD-10-CM

## 2019-08-02 DIAGNOSIS — Z8041 Family history of malignant neoplasm of ovary: Secondary | ICD-10-CM

## 2019-08-02 LAB — BASIC METABOLIC PANEL
Anion gap: 14 (ref 5–15)
BUN: 33 mg/dL — ABNORMAL HIGH (ref 8–23)
CO2: 22 mmol/L (ref 22–32)
Calcium: 9.1 mg/dL (ref 8.9–10.3)
Chloride: 99 mmol/L (ref 98–111)
Creatinine, Ser: 2.1 mg/dL — ABNORMAL HIGH (ref 0.44–1.00)
GFR calc Af Amer: 25 mL/min — ABNORMAL LOW (ref >60)
GFR calc non Af Amer: 21 mL/min — ABNORMAL LOW (ref >60)
Glucose, Bld: 135 mg/dL — ABNORMAL HIGH (ref 70–99)
Potassium: 3.9 mmol/L (ref 3.5–5.1)
Sodium: 135 mmol/L (ref 135–145)

## 2019-08-02 LAB — SARS CORONAVIRUS 2 (TAT 6-24 HRS): SARS Coronavirus 2: NEGATIVE

## 2019-08-02 LAB — CBC
HCT: 40 % (ref 36.0–46.0)
Hemoglobin: 13.4 g/dL (ref 12.0–15.0)
MCH: 31 pg (ref 26.0–34.0)
MCHC: 33.5 g/dL (ref 30.0–36.0)
MCV: 92.6 fL (ref 80.0–100.0)
Platelets: 162 10*3/uL (ref 150–400)
RBC: 4.32 MIL/uL (ref 3.87–5.11)
RDW: 13.2 % (ref 11.5–15.5)
WBC: 13.3 10*3/uL — ABNORMAL HIGH (ref 4.0–10.5)
nRBC: 0 % (ref 0.0–0.2)

## 2019-08-02 LAB — TROPONIN I (HIGH SENSITIVITY): Troponin I (High Sensitivity): 26 ng/L — ABNORMAL HIGH (ref <18)

## 2019-08-02 LAB — BRAIN NATRIURETIC PEPTIDE: B Natriuretic Peptide: 443 pg/mL — ABNORMAL HIGH (ref 0.0–100.0)

## 2019-08-02 MED ORDER — LOSARTAN POTASSIUM 50 MG PO TABS
100.0000 mg | ORAL_TABLET | Freq: Every day | ORAL | Status: DC
  Administered 2019-08-02 – 2019-08-05 (×4): 100 mg via ORAL
  Filled 2019-08-02 (×4): qty 2

## 2019-08-02 MED ORDER — IPRATROPIUM-ALBUTEROL 0.5-2.5 (3) MG/3ML IN SOLN: 3.0000 mL | Freq: Four times a day (QID) | RESPIRATORY_TRACT | Status: DC | PRN

## 2019-08-02 MED ORDER — FUROSEMIDE 10 MG/ML IJ SOLN
40.0000 mg | Freq: Two times a day (BID) | INTRAMUSCULAR | Status: DC
  Administered 2019-08-02 – 2019-08-03 (×2): 40 mg via INTRAVENOUS
  Filled 2019-08-02 (×2): qty 4

## 2019-08-02 MED ORDER — METOPROLOL TARTRATE 25 MG PO TABS
25.0000 mg | ORAL_TABLET | Freq: Two times a day (BID) | ORAL | Status: DC
  Administered 2019-08-02 – 2019-08-03 (×3): 25 mg via ORAL
  Filled 2019-08-02 (×3): qty 1

## 2019-08-02 MED ORDER — ACETAMINOPHEN 650 MG RE SUPP: 650.0000 mg | Freq: Four times a day (QID) | RECTAL | Status: DC | PRN

## 2019-08-02 MED ORDER — SODIUM CHLORIDE 0.9% FLUSH: 3.0000 mL | Freq: Once | INTRAVENOUS | Status: DC

## 2019-08-02 MED ORDER — SODIUM CHLORIDE 0.9 % IV SOLN: INTRAVENOUS | Status: DC

## 2019-08-02 MED ORDER — HYDRALAZINE HCL 25 MG PO TABS
25.0000 mg | ORAL_TABLET | Freq: Three times a day (TID) | ORAL | Status: DC
  Administered 2019-08-02 – 2019-08-03 (×5): 25 mg via ORAL
  Filled 2019-08-02 (×5): qty 1

## 2019-08-02 MED ORDER — HEPARIN SODIUM (PORCINE) 5000 UNIT/ML IJ SOLN
5000.0000 [IU] | Freq: Three times a day (TID) | INTRAMUSCULAR | Status: DC
  Administered 2019-08-02 – 2019-08-06 (×11): 5000 [IU] via SUBCUTANEOUS
  Filled 2019-08-02 (×11): qty 1

## 2019-08-02 MED ORDER — FLUTICASONE PROPIONATE 50 MCG/ACT NA SUSP
2.0000 | Freq: Every day | NASAL | Status: DC
  Filled 2019-08-02: qty 16

## 2019-08-02 MED ORDER — IPRATROPIUM-ALBUTEROL 0.5-2.5 (3) MG/3ML IN SOLN
3.0000 mL | Freq: Once | RESPIRATORY_TRACT | Status: AC
  Administered 2019-08-02: 3 mL via RESPIRATORY_TRACT
  Filled 2019-08-02: qty 3

## 2019-08-02 MED ORDER — ACETAMINOPHEN 325 MG PO TABS: 650.0000 mg | ORAL_TABLET | Freq: Four times a day (QID) | ORAL | Status: DC | PRN

## 2019-08-02 MED ORDER — ASPIRIN EC 81 MG PO TBEC
81.0000 mg | DELAYED_RELEASE_TABLET | Freq: Every day | ORAL | Status: DC
  Administered 2019-08-02 – 2019-08-06 (×5): 81 mg via ORAL
  Filled 2019-08-02 (×4): qty 1

## 2019-08-02 MED ORDER — ONDANSETRON HCL 4 MG PO TABS: 4.0000 mg | ORAL_TABLET | Freq: Four times a day (QID) | ORAL | Status: DC | PRN

## 2019-08-02 MED ORDER — AMLODIPINE BESYLATE 5 MG PO TABS
5.0000 mg | ORAL_TABLET | Freq: Two times a day (BID) | ORAL | Status: DC
  Administered 2019-08-02 – 2019-08-03 (×3): 5 mg via ORAL
  Filled 2019-08-02 (×3): qty 1

## 2019-08-02 MED ORDER — GABAPENTIN 100 MG PO CAPS
200.0000 mg | ORAL_CAPSULE | Freq: Every day | ORAL | Status: DC
  Administered 2019-08-02 – 2019-08-05 (×4): 200 mg via ORAL
  Filled 2019-08-02 (×4): qty 2

## 2019-08-02 MED ORDER — PRAVASTATIN SODIUM 40 MG PO TABS
40.0000 mg | ORAL_TABLET | Freq: Every day | ORAL | Status: DC
  Administered 2019-08-02 – 2019-08-05 (×4): 40 mg via ORAL
  Filled 2019-08-02 (×5): qty 1

## 2019-08-02 MED ORDER — ONDANSETRON HCL 4 MG/2ML IJ SOLN: 4.0000 mg | Freq: Four times a day (QID) | INTRAMUSCULAR | Status: DC | PRN

## 2019-08-02 MED ORDER — VITAMIN B-12 1000 MCG PO TABS
2000.0000 ug | ORAL_TABLET | Freq: Every day | ORAL | Status: DC
  Administered 2019-08-02 – 2019-08-06 (×5): 2000 ug via ORAL
  Filled 2019-08-02 (×6): qty 2

## 2019-08-02 MED ORDER — CLOPIDOGREL BISULFATE 75 MG PO TABS
75.0000 mg | ORAL_TABLET | Freq: Every day | ORAL | Status: DC
  Administered 2019-08-02 – 2019-08-06 (×5): 75 mg via ORAL
  Filled 2019-08-02 (×5): qty 1

## 2019-08-02 MED ORDER — HYDRALAZINE HCL 20 MG/ML IJ SOLN
10.0000 mg | Freq: Four times a day (QID) | INTRAMUSCULAR | Status: DC | PRN
  Administered 2019-08-04: 10 mg via INTRAVENOUS
  Filled 2019-08-02: qty 1

## 2019-08-02 MED ORDER — METHYLPREDNISOLONE SODIUM SUCC 125 MG IJ SOLR
125.0000 mg | Freq: Once | INTRAMUSCULAR | Status: AC
  Administered 2019-08-02: 15:00:00 125 mg via INTRAVENOUS
  Filled 2019-08-02: qty 2

## 2019-08-02 MED ORDER — MOMETASONE FURO-FORMOTEROL FUM 200-5 MCG/ACT IN AERO
2.0000 | INHALATION_SPRAY | Freq: Two times a day (BID) | RESPIRATORY_TRACT | Status: DC
  Administered 2019-08-02 – 2019-08-06 (×8): 2 via RESPIRATORY_TRACT
  Filled 2019-08-02: qty 8.8

## 2019-08-02 MED ORDER — VITAMIN D 25 MCG (1000 UNIT) PO TABS
1000.0000 [IU] | ORAL_TABLET | Freq: Every day | ORAL | Status: DC
  Administered 2019-08-02 – 2019-08-06 (×5): 1000 [IU] via ORAL
  Filled 2019-08-02 (×5): qty 1

## 2019-08-02 MED ORDER — POTASSIUM CHLORIDE CRYS ER 10 MEQ PO TBCR
10.0000 meq | EXTENDED_RELEASE_TABLET | Freq: Every day | ORAL | Status: DC
  Administered 2019-08-02 – 2019-08-05 (×4): 10 meq via ORAL
  Filled 2019-08-02 (×4): qty 1

## 2019-08-02 MED ORDER — TORSEMIDE 20 MG PO TABS: 20.0000 mg | ORAL_TABLET | Freq: Every morning | ORAL | Status: DC

## 2019-08-02 NOTE — ED Notes (Signed)
RN attempted to call report. RN spoke to Retail banker and stated that the Charge RN will call back with the receiving RN and number in order to transport patient.

## 2019-08-02 NOTE — ED Notes (Signed)
Patient transported to X-ray 

## 2019-08-02 NOTE — ED Provider Notes (Signed)
[facility] Emergency Department Provider Note       Time seen: ----------------------------------------- 1:41 PM on 08/02/2019 -----------------------------------------   I have reviewed the triage vital signs and the nursing notes.  HISTORY   Chief Complaint Shortness of Breath and Weakness    HPI Tina [name] is a 85 y.o. female with a history of anemia, asthma, coronary artery disease, CHF, COPD, hyperlipidemia, hypertension who presents to the ED for increasing shortness of breath and weakness over the past 2 to 3 weeks.  Patient states she is unable to climb her stairs as normal.  She noticed a drop in her oxygen saturation to the low 90s.  Denies any recent sickness.  Past Medical History:  Diagnosis Date  . Anemia   . Asthma   . Bladder cancer ([place])   . BRCA positive   . CAD (coronary artery disease)   . CHF (congestive heart failure) ([place])   . COPD (chronic obstructive pulmonary disease) ([place])   . Emphysema lung ([place])   . GERD (gastroesophageal reflux disease)   . History of colon polyps   . Hypercholesterolemia   . Hyperglycemia   . Hypertension   . Lung nodules   . Osteoporosis   . Personal history of tobacco use, presenting hazards to health 11/25/2014  . Polycythemia vera(238.4)   . Renal cyst   . Skin cancer     Patient Active Problem List   Diagnosis Date Noted  . Renal artery stenosis ([place]) 07/01/2019  . DOE (dyspnea on exertion) 05/25/2019  . Diarrhea 05/10/2019  . AKI (acute kidney injury) ([place])   . Hypokalemia   . Foot drop 04/02/2019  . Low back pain 03/16/2019  . Leg pain 03/16/2019  . Aortic atherosclerosis ([place]) 01/07/2019  . Hoarseness 06/23/2018  . Unsteadiness 05/11/2018  . CHF (congestive heart failure) ([place]) 10/20/2017  . Pulmonary embolus ([place]) 06/28/2017  . 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 Goals of care, counseling/discussion 03/02/2017  . Abnormal computed tomography of  gastrointestinal tract   . Noninfectious diarrhea   . Ulceration of intestine   . Hernia, hiatal   . Bladder mass   . Mesenteric ischemia due to arterial insufficiency ([place])   . Polycythemia vera ([place]) 10/21/2016  . Elevated troponin 06/14/2016  . CKD (chronic kidney disease), stage III ([place]) 06/14/2016  . Leukocytosis 06/14/2016  . Abdominal bruit 04/16/2016  . Abnormal mammogram 06/06/2015  . Essential hypertension 02/16/2015  . Numbness 01/24/2015  . Personal history of tobacco use, presenting hazards to health 11/25/2014  . BRCA positive   . COPD exacerbation ([place]) 11/14/2014  . Sensation of pressure in bladder area 08/15/2014  . Health care maintenance 08/15/2014  . Presence of coronary angioplasty implant and graft 09/30/2013  . History of colonic polyps 08/25/2012  . Female genuine stress incontinence 06/28/2012  . Infection of urinary tract 06/28/2012  . CAD (coronary artery disease) 05/23/2012  . COPD (chronic obstructive pulmonary disease) ([place]) 05/23/2012  . Hypercholesterolemia 05/23/2012  . Hyperglycemia 05/23/2012  . Polycythemia, secondary 05/23/2012  . Renal cyst 05/23/2012  . Osteoporosis 05/23/2012    Past Surgical History:  Procedure Laterality Date  . ANGIOPLASTY     coronary (x1)  . COLONOSCOPY WITH PROPOFOL N/A 03/02/2015   Procedure: COLONOSCOPY WITH PROPOFOL;  Surgeon: Lollie Sails, MD;  Location: [facility]COPY;  Service: Endoscopy;  Laterality: N/A;  . COLONOSCOPY WITH PROPOFOL N/A 02/13/2017   Procedure: COLONOSCOPY WITH PROPOFOL;  Surgeon: [name], MD;  Location:  [place] ENDOSCOPY;  Service: Endoscopy;  Laterality: N/A;  . CORONARY ANGIOPLASTY    . CORONARY ARTERY BYPASS GRAFT  09/17/2017   pt denies  . CYSTOSCOPY W/ RETROGRADES Bilateral 02/13/2017   Procedure: CYSTOSCOPY WITH RETROGRADE PYELOGRAM;  Surgeon: Hollice Espy, MD;  Location: [facility] ORS;  Service: Urology;  Laterality: Bilateral;  . CYSTOSCOPY W/ RETROGRADES Bilateral 09/19/2017    Procedure: CYSTOSCOPY WITH RETROGRADE PYELOGRAM;  Surgeon: Hollice Espy, MD;  Location: [facility] ORS;  Service: Urology;  Laterality: Bilateral;  . CYSTOSCOPY WITH BIOPSY  03/26/2017   Procedure: CYSTOSCOPY WITH BIOPSY;  Surgeon: Hollice Espy, MD;  Location: [facility] ORS;  Service: Urology;;  . Consuela Mimes WITH STENT PLACEMENT Bilateral 02/13/2017   Procedure: CYSTOSCOPY WITH STENT PLACEMENT;  Surgeon: Hollice Espy, MD;  Location: [facility] ORS;  Service: Urology;  Laterality: Bilateral;  . CYSTOSCOPY WITH STENT PLACEMENT Bilateral 03/26/2017   Procedure: CYSTOSCOPY WITH STENT EXCHANGE;  Surgeon: Hollice Espy, MD;  Location: [facility] ORS;  Service: Urology;  Laterality: Bilateral;  . ESOPHAGOGASTRODUODENOSCOPY (EGD) WITH PROPOFOL N/A 02/13/2017   Procedure: ESOPHAGOGASTRODUODENOSCOPY (EGD) WITH PROPOFOL;  Surgeon: [name], MD;  Location: [facility] ENDOSCOPY;  Service: Endoscopy;  Laterality: N/A;  . PORTA CATH INSERTION N/A 02/28/2017   Procedure: PORTA CATH INSERTION;  Surgeon: Algernon Huxley, MD;  Location: [place] CV LAB;  Service: Cardiovascular;  Laterality: N/A;  . PORTA CATH REMOVAL N/A 12/24/2017   Procedure: PORTA CATH REMOVAL;  Surgeon: Algernon Huxley, MD;  Location: [place] [facility] LAB;  Service: Cardiovascular;  Laterality: N/A;  . SALPINGOOPHORECTOMY    . TONSILECTOMY/ADENOIDECTOMY WITH MYRINGOTOMY    . TONSILLECTOMY    . TRANSURETHRAL RESECTION OF BLADDER TUMOR N/A 02/13/2017   Procedure: TRANSURETHRAL RESECTION OF BLADDER TUMOR (TURBT);  Surgeon: Hollice Espy, MD;  Location: [facility] ORS;  Service: Urology;  Laterality: N/A;  . TUBAL LIGATION    . UPPER GI ENDOSCOPY  02/13/2017  . URETEROSCOPY Left 09/19/2017   Procedure: URETEROSCOPY;  Surgeon: Hollice Espy, MD;  Location: [facility] ORS;  Service: Urology;  Laterality: Left;  Marland Kitchen VISCERAL ANGIOGRAPHY N/A 01/23/2019   Procedure: VISCERAL ANGIOGRAPHY;  Surgeon: Algernon Huxley, MD;  Location: [place] CV LAB;  Service: Cardiovascular;   Laterality: N/A;  . VISCERAL ANGIOGRAPHY N/A 06/09/2019   Procedure: VISCERAL ANGIOGRAPHY;  Surgeon: Algernon Huxley, MD;  Location: [place] [facility] LAB;  Service: Cardiovascular;  Laterality: N/A;  . VISCERAL ARTERY INTERVENTION N/A 02/12/2017   Procedure: VISCERAL ARTERY INTERVENTION;  Surgeon: Algernon Huxley, MD;  Location: [place] [facility] LAB;  Service: Cardiovascular;  Laterality: N/A;    Allergies Evista [raloxifene] and Fluticasone-salmeterol  Social History Social History   Tobacco Use  . Smoking status: Former Smoker    Packs/day: 1.00    Years: 45.00    Pack years: 45.00    Types: Cigarettes    Quit date: 11/11/2003    Years since quitting: 15.7  . Smokeless tobacco: Never Used  Substance Use Topics  . Alcohol use: Not Currently    Alcohol/week: 0.0 standard drinks  . Drug use: No    Review of Systems Constitutional: Negative for fever. Cardiovascular: Negative for chest pain. Respiratory: Positive for shortness of breath Gastrointestinal: Negative for abdominal pain, vomiting and diarrhea. Musculoskeletal: Negative for back pain. Skin: Negative for rash. Neurological: Negative for headaches, focal weakness or numbness.  All systems negative/normal/unremarkable except as stated in the HPI  ____________________________________________   PHYSICAL EXAM:  VITAL SIGNS: ED Triage Vitals  Enc Vitals Group  BP 08/02/19 1156 (!) 187/104     Pulse Rate 08/02/19 1132 95     Resp 08/02/19 1156 (!) 22     Temp 08/02/19 1156 99.5 F (37.5 C)     Temp Source 08/02/19 1156 Oral     SpO2 08/02/19 1132 95 %     Weight 08/02/19 1157 133 lb (60.3 kg)     Height 08/02/19 1157 _0  (1.651 m)     Head Circumference --      Peak Flow --      Pain Score 08/02/19 1157 0     Pain Loc --      Pain Edu? --      Excl. in [place]? --     Constitutional: Alert and oriented. Well appearing and in no distress. Eyes: Conjunctivae are normal. Normal extraocular movements. ENT       Head: Normocephalic and atraumatic.      Nose: No congestion/rhinnorhea.      Mouth/Throat: Mucous membranes are moist.      Neck: No stridor. Cardiovascular: Normal rate, regular rhythm. No murmurs, rubs, or gallops. Respiratory: Normal respiratory effort without tachypnea nor retractions. Breath sounds are clear and equal bilaterally. No wheezes/rales/rhonchi. Gastrointestinal: Soft and nontender. Normal bowel sounds Musculoskeletal: Nontender with normal range of motion in extremities.  Mild peripheral edema is noted Neurologic:  Normal speech and language. No gross focal neurologic deficits are appreciated.  Skin:  Skin is warm, dry and intact. No rash noted. Psychiatric: Mood and affect are normal. Speech and behavior are normal.  ____________________________________________  EKG: Interpreted by me.  Atrial fibrillation with a rate of 89 bpm, normal QRS, normal axis  ____________________________________________  ED COURSE:  As part of my medical decision making, I reviewed the following data within the [place] History obtained from family if available, nursing notes, old chart and ekg, as well as notes from prior ED visits. Patient presented for shortness of breath and weakness, we will assess with labs and imaging as indicated at this time.   Procedures  LORENA BENHAM was evaluated in Emergency Department on 08/02/2019 for the symptoms described in the history of present illness. She was evaluated in the context of the global COVID-19 pandemic, which necessitated consideration that the patient might be at risk for infection with the SARS-CoV-2 virus that causes COVID-19. Institutional protocols and algorithms that pertain to the evaluation of patients at risk for COVID-19 are in a state of rapid change based on information released by regulatory bodies including the CDC and federal and state organizations. These policies and algorithms were followed during the  patient's care in the ED.  ____________________________________________   LABS (pertinent positives/negatives)  Labs Reviewed  BASIC METABOLIC PANEL - Abnormal; Notable for the following components:      Result Value   Glucose, Bld 135 (*)    BUN 33 (*)    Creatinine, Ser 2.10 (*)    GFR calc non Af Amer 21 (*)    GFR calc Af Amer 25 (*)    All other components within normal limits  CBC - Abnormal; Notable for the following components:   WBC 13.3 (*)    All other components within normal limits  BRAIN NATRIURETIC PEPTIDE - Abnormal; Notable for the following components:   B Natriuretic Peptide 443.0 (*)    All other components within normal limits  TROPONIN I (HIGH SENSITIVITY) - Abnormal; Notable for the following components:   Troponin I (High Sensitivity) 26 (*)  All other components within normal limits    RADIOLOGY Images were viewed by me  Chest x-ray IMPRESSION:  Small left pleural effusion and mild bibasilar opacities favored to  represent scarring or atelectasis. COPD and chronic bronchitic  change.  ____________________________________________   DIFFERENTIAL DIAGNOSIS   CHF, COPD, pneumonia, COVID-19, dehydration, renal failure, electrolyte abnormality  FINAL ASSESSMENT AND PLAN  Dyspnea, acute kidney injury, CHF, COPD   Plan: The patient had presented for worsening shortness of breath. Patient's labs did indicate acute kidney injury compared to prior. Patient's imaging revealed a small left pleural effusion and bibasilar opacities.  Patient will be treated for COPD and CHF in the setting of acute kidney injury which will need to be handled carefully.  She has severe dyspnea on exertion.  She may require repeat echocardiogram.  I will discuss with the hospitalist for admission.   Laurence Aly, MD    Note: This note was generated in part or whole with voice recognition software. Voice recognition is usually quite accurate but there are  transcription errors that can and very often do occur. I apologize for any typographical errors that were not detected and corrected.     Earleen Newport, MD 08/02/19 [phone]

## 2019-08-02 NOTE — ED Triage Notes (Signed)
Pt to ER states increasing shortness of breath and weakness over last 2-3 weeks. PT states she in unable to climb her stairs at home as normal.  States she has noted a drop in exertional O2 sats to low 90s.  Pt denies cough, fever or sick contacts.

## 2019-08-02 NOTE — H&P (Signed)
History and Physical    Tina [name] BRA:[id number] DOB: 1935-04-12 DOA: 08/02/2019  PCP: Einar Pheasant, MD   Patient coming from: Home  I have personally briefly reviewed patient's old medical records in [place]  Chief Complaint: Shortness of breath  HPI: Tina [name] is a 85 y.o. female with medical history significant for COPD, hypertension, CHF, anemia of chronic disease and GERD  Who presented to the emergency room for evaluation of shortness of breath and worsening lower extremity weakness which she has had for about 3 weeks.  Shortness of breath is mostly with exertion and she has noted she is unable to climb her stairs as she usually would. For the last 1 week she has been unable to ambulate and states that her right leg appears weaker than her left leg. She has been told in the past that she has peripheral neuropathy. She has bilateral lower extremity swelling and some puffiness of her hands.   She denies having any chest pain, orthopnea, diaphoresis or palpitations. She denies having any nausea, vomiting, abdominal pain or changes in her bowel habits.  ED Course: The patient had presented for worsening shortness of breath. Patient's labs did indicate an acute kidney injury compared to prior 1.08 >> 2.10. Patient's imaging revealed a small left pleural effusion and bibasilar opacities suggestive of COPD.  Patient will be treated for COPD and CHF in the setting of acute kidney injury which will need to be handled carefully.  She has severe dyspnea on exertion.  She may require repeat echocardiogram. Admission has bee requested for further management  Review of Systems: As per HPI otherwise 10 point review of systems negative.    Past Medical History:  Diagnosis Date  . Anemia   . Asthma   . Bladder cancer ([place])   . BRCA positive   . CAD (coronary artery disease)   . CHF (congestive heart failure) ([place])   . COPD (chronic obstructive pulmonary disease) ([place])   .  Emphysema lung ([place])   . GERD (gastroesophageal reflux disease)   . History of colon polyps   . Hypercholesterolemia   . Hyperglycemia   . Hypertension   . Lung nodules   . Osteoporosis   . Personal history of tobacco use, presenting hazards to health 11/25/2014  . Polycythemia vera(238.4)   . Renal cyst   . Skin cancer     Past Surgical History:  Procedure Laterality Date  . ANGIOPLASTY     coronary (x1)  . COLONOSCOPY WITH PROPOFOL N/A 03/02/2015   Procedure: COLONOSCOPY WITH PROPOFOL;  Surgeon: Lollie Sails, MD;  Location: [facility] ENDOSCOPY;  Service: Endoscopy;  Laterality: N/A;  . COLONOSCOPY WITH PROPOFOL N/A 02/13/2017   Procedure: COLONOSCOPY WITH PROPOFOL;  Surgeon: [name], MD;  Location: [facility] ENDOSCOPY;  Service: Endoscopy;  Laterality: N/A;  . CORONARY ANGIOPLASTY    . CORONARY ARTERY BYPASS GRAFT  09/17/2017   pt denies  . CYSTOSCOPY W/ RETROGRADES Bilateral 02/13/2017   Procedure: CYSTOSCOPY WITH RETROGRADE PYELOGRAM;  Surgeon: Hollice Espy, MD;  Location: [facility] ORS;  Service: Urology;  Laterality: Bilateral;  . CYSTOSCOPY W/ RETROGRADES Bilateral 09/19/2017   Procedure: CYSTOSCOPY WITH RETROGRADE PYELOGRAM;  Surgeon: Hollice Espy, MD;  Location: [facility] ORS;  Service: Urology;  Laterality: Bilateral;  . CYSTOSCOPY WITH BIOPSY  03/26/2017   Procedure: CYSTOSCOPY WITH BIOPSY;  Surgeon: Hollice Espy, MD;  Location: [facility] ORS;  Service: Urology;;  . Consuela Mimes WITH STENT PLACEMENT Bilateral 02/13/2017   Procedure: CYSTOSCOPY WITH STENT  PLACEMENT;  Surgeon: Hollice Espy, MD;  Location: [facility] ORS;  Service: Urology;  Laterality: Bilateral;  . CYSTOSCOPY WITH STENT PLACEMENT Bilateral 03/26/2017   Procedure: CYSTOSCOPY WITH STENT EXCHANGE;  Surgeon: Hollice Espy, MD;  Location: [facility] ORS;  Service: Urology;  Laterality: Bilateral;  . ESOPHAGOGASTRODUODENOSCOPY (EGD) WITH PROPOFOL N/A 02/13/2017   Procedure: ESOPHAGOGASTRODUODENOSCOPY (EGD) WITH PROPOFOL;  Surgeon:  [name], MD;  Location: [facility] ENDOSCOPY;  Service: Endoscopy;  Laterality: N/A;  . PORTA CATH INSERTION N/A 02/28/2017   Procedure: PORTA CATH INSERTION;  Surgeon: Algernon Huxley, MD;  Location: [place] CV LAB;  Service: Cardiovascular;  Laterality: N/A;  . PORTA CATH REMOVAL N/A 12/24/2017   Procedure: PORTA CATH REMOVAL;  Surgeon: Algernon Huxley, MD;  Location: [place] CV LAB;  Service: Cardiovascular;  Laterality: N/A;  . SALPINGOOPHORECTOMY    . TONSILECTOMY/ADENOIDECTOMY WITH MYRINGOTOMY    . TONSILLECTOMY    . TRANSURETHRAL RESECTION OF BLADDER TUMOR N/A 02/13/2017   Procedure: TRANSURETHRAL RESECTION OF BLADDER TUMOR (TURBT);  Surgeon: Hollice Espy, MD;  Location: [facility] ORS;  Service: Urology;  Laterality: N/A;  . TUBAL LIGATION    . UPPER GI ENDOSCOPY  02/13/2017  . URETEROSCOPY Left 09/19/2017   Procedure: URETEROSCOPY;  Surgeon: Hollice Espy, MD;  Location: [facility] ORS;  Service: Urology;  Laterality: Left;  Marland Kitchen VISCERAL ANGIOGRAPHY N/A 01/23/2019   Procedure: VISCERAL ANGIOGRAPHY;  Surgeon: Algernon Huxley, MD;  Location: [place] CV LAB;  Service: Cardiovascular;  Laterality: N/A;  . VISCERAL ANGIOGRAPHY N/A 06/09/2019   Procedure: VISCERAL ANGIOGRAPHY;  Surgeon: Algernon Huxley, MD;  Location: [place] CV LAB;  Service: Cardiovascular;  Laterality: N/A;  . VISCERAL ARTERY INTERVENTION N/A 02/12/2017   Procedure: VISCERAL ARTERY INTERVENTION;  Surgeon: Algernon Huxley, MD;  Location: [place] CV LAB;  Service: Cardiovascular;  Laterality: N/A;     reports that she quit smoking about 15 years ago. Her smoking use included cigarettes. She has a 45.00 pack-year smoking history. She has never used smokeless tobacco. She reports previous alcohol use. She reports that she does not use drugs.  Allergies  Allergen Reactions  . Evista [Raloxifene] Other (See Comments)    Night sweats   . Fluticasone-Salmeterol Other (See Comments)    Cough, "chokes me"     Family History   Problem Relation Age of Onset  . Cirrhosis Father        died age 68  . Alcohol abuse Father   . Asthma Mother   . Congestive Heart Failure Mother   . Breast cancer Mother        2 (1/2 sisters)  . Osteoarthritis Mother   . Colon cancer Mother   . Lupus Sister   . Alcohol abuse Sister   . Ovarian cancer Sister   . Osteoporosis Sister   . Skin cancer Sister   . Breast cancer Cousin   . Breast cancer Maternal Aunt      Prior to Admission medications   Medication Sig Start Date End Date Taking? Authorizing Provider  amLODipine (NORVASC) 5 MG tablet TAKE 1 TABLET BY MOUTH  TWICE DAILY Patient taking differently: Take 5 mg by mouth 2 (two) times daily.  03/18/19  Yes Einar Pheasant, MD  aspirin EC 81 MG tablet Take 1 tablet (81 mg total) by mouth daily. 01/23/19  Yes Dew, Erskine Squibb, MD  cholecalciferol (VITAMIN D) 1000 units tablet Take 1,000 Units by mouth daily.   Yes [provider]  clopidogrel (PLAVIX) 75 MG tablet  Take 1 tablet (75 mg total) by mouth daily. 03/29/18  Yes Einar Pheasant, MD  cyanocobalamin 1000 MCG tablet Take 2,000 mcg by mouth daily.   Yes [provider]  fluticasone (FLONASE) 50 MCG/ACT nasal spray USE 2 SPRAYS INTO BOTH  NOSTRILS DAILY AS NEEDED  FOR ALLERGIES. Patient taking differently: Place 2 sprays into both nostrils daily.  10/01/18  Yes Einar Pheasant, MD  gabapentin (NEURONTIN) 100 MG capsule Take 2 capsules by mouth at bedtime. 06/02/19  Yes [provider]  hydrALAZINE (APRESOLINE) 25 MG tablet TAKE ONE TABLET BY MOUTH THREE TIMES DAILY 07/04/19  Yes Einar Pheasant, MD  losartan (COZAAR) 100 MG tablet TAKE 1 TABLET BY MOUTH ONCE DAILY Patient taking differently: Take 100 mg by mouth daily. TAKE 1 TABLET BY MOUTH ONCE DAILY 03/18/19  Yes Einar Pheasant, MD  lovastatin (MEVACOR) 40 MG tablet TAKE 1 TABLET BY MOUTH  DAILY IN THE EVENING 06/25/19  Yes Einar Pheasant, MD  metoprolol tartrate (LOPRESSOR) 25 MG tablet TAKE 1 TABLET  BY MOUTH  TWICE DAILY Patient taking differently: Take 25 mg by mouth 2 (two) times daily.  03/18/19  Yes Einar Pheasant, MD  potassium chloride (KLOR-CON) 10 MEQ tablet TAKE 1 TABLET BY MOUTH  DAILY 06/25/19  Yes Einar Pheasant, MD  SYMBICORT 160-4.5 MCG/ACT inhaler USE 2 PUFFS BY MOUTH TWO  TIMES DAILY 06/30/19  Yes Einar Pheasant, MD  torsemide (DEMADEX) 20 MG tablet Take 20 mg by mouth every morning. 12/12/17  Yes [provider]  albuterol (PROVENTIL HFA;VENTOLIN HFA) 108 (90 Base) MCG/ACT inhaler Inhale 2 puffs into the lungs every 6 (six) hours as needed for wheezing or shortness of breath. 10/22/17   Bettey Costa, MD  ipratropium-albuterol (DUONEB) 0.5-2.5 (3) MG/3ML SOLN Take 3 mLs by nebulization every 6 (six) hours as needed (for shortness of breath). 11/14/14   Joanne Gavel, MD    Physical Exam: Vitals:   08/02/19 1500 08/02/19 1530 08/02/19 1600 08/02/19 1630  BP: (!) 178/73 (!) 177/64 (!) 180/81 (!) 165/72  Pulse: 81 82 84 87  Resp: (!) '22 17 15 18  ' Temp:      TempSrc:      SpO2: 94% 98% 97% 98%  Weight:      Height:         Vitals:   08/02/19 1500 08/02/19 1530 08/02/19 1600 08/02/19 1630  BP: (!) 178/73 (!) 177/64 (!) 180/81 (!) 165/72  Pulse: 81 82 84 87  Resp: (!) '22 17 15 18  ' Temp:      TempSrc:      SpO2: 94% 98% 97% 98%  Weight:      Height:        Constitutional: NAD, alert and oriented x 3 Eyes: PERRL, lids and conjunctivae normal ENMT: Mucous membranes are moist.  Neck: normal, supple, no masses, no thyromegaly Respiratory: clear to auscultation bilaterally, no wheezing, scattered crackles at the bases. Normal respiratory effort. No accessory muscle use.  Cardiovascular: Regular rate and rhythm,no murmurs / rubs / gallops. 1+ extremity edema. 2+ pedal pulses. No carotid bruits.  Abdomen: no tenderness, no masses palpated. No hepatosplenomegaly. Bowel sounds positive.  Musculoskeletal: no clubbing / cyanosis. No joint deformity upper and lower  extremities.  Skin: no rashes, lesions, ulcers.  Neurologic: No gross focal neurologic deficit. Psychiatric: Normal mood and affect.   Labs on Admission: I have personally reviewed following labs and imaging studies  CBC: Recent Labs  Lab 08/02/19 1210  WBC 13.3*  HGB 13.4  HCT 40.0  MCV 92.6  PLT 130   Basic Metabolic Panel: Recent Labs  Lab 08/02/19 1210  NA 135  K 3.9  CL 99  CO2 22  GLUCOSE 135*  BUN 33*  CREATININE 2.10*  CALCIUM 9.1   GFR: Estimated Creatinine Clearance: 18.6 mL/min (A) (by C-G formula based on SCr of 2.1 mg/dL (H)). Liver Function Tests: No results for input(s): AST, ALT, ALKPHOS, BILITOT, PROT, ALBUMIN in the last 168 hours. No results for input(s): LIPASE, AMYLASE in the last 168 hours. No results for input(s): AMMONIA in the last 168 hours. Coagulation Profile: No results for input(s): INR, PROTIME in the last 168 hours. Cardiac Enzymes: No results for input(s): CKTOTAL, CKMB, CKMBINDEX, TROPONINI in the last 168 hours. BNP (last 3 results) No results for input(s): PROBNP in the last 8760 hours. HbA1C: No results for input(s): HGBA1C in the last 72 hours. CBG: No results for input(s): GLUCAP in the last 168 hours. Lipid Profile: No results for input(s): CHOL, HDL, LDLCALC, TRIG, CHOLHDL, LDLDIRECT in the last 72 hours. Thyroid Function Tests: No results for input(s): TSH, T4TOTAL, FREET4, T3FREE, THYROIDAB in the last 72 hours. Anemia Panel: No results for input(s): VITAMINB12, FOLATE, FERRITIN, TIBC, IRON, RETICCTPCT in the last 72 hours. Urine analysis:    Component Value Date/Time   COLORURINE YELLOW (A) 05/10/2019 1215   APPEARANCEUR CLEAR (A) 05/10/2019 1215   APPEARANCEUR Clear 09/13/2017 1502   LABSPEC 1.011 05/10/2019 1215   PHURINE 5.0 05/10/2019 1215   GLUCOSEU NEGATIVE 05/10/2019 1215   GLUCOSEU NEGATIVE 10/09/2017 1121   HGBUR NEGATIVE 05/10/2019 1215   BILIRUBINUR NEGATIVE 05/10/2019 1215   BILIRUBINUR Negative  09/13/2017 [place] 05/10/2019 1215   PROTEINUR NEGATIVE 05/10/2019 1215   UROBILINOGEN 0.2 10/09/2017 1121   NITRITE NEGATIVE 05/10/2019 1215   LEUKOCYTESUR NEGATIVE 05/10/2019 1215    Radiological Exams on Admission: DG Chest 2 View  Result Date: 08/02/2019 CLINICAL DATA:  increasing shortness of breath and weakness over last 2-3 weeks. PT states she in unable to climb her stairs at home as normal. States she has noted a drop in exertional O2 sats to low 90s. EXAM: CHEST - 2 VIEW COMPARISON:  Chest radiograph 10/20/2017 FINDINGS: The heart size and mediastinal contours are within normal limits. Aortic arch calcification. Lungs are hyperinflated chronic bilateral bronchial wall thickening. There is a small left pleural effusion and mild bibasilar opacities which could reflect scarring or atelectasis. No pneumothorax. No acute finding in the visualized skeleton. Both lungs are clear. The visualized skeletal structures are unremarkable. IMPRESSION: Small left pleural effusion and mild bibasilar opacities favored to represent scarring or atelectasis. COPD and chronic bronchitic change. Electronically Signed   By: Audie Pinto M.D.   On: 08/02/2019 13:53    EKG: Independently reviewed. Atrial fibrillation  Assessment/Plan Principal Problem:   Acute on chronic diastolic CHF (congestive heart failure) (HCC) Active Problems:   CAD (coronary artery disease)   Essential hypertension   AKI (acute kidney injury) ([place])   COPD with acute exacerbation ([facility])   Abnormality of gait     Acute on chronic diastolic dysfunction CHF Patient presents for evaluation of shortness of breath mostly with exertion Blood pressure was significantly elevated Last 2D echocardiogram shows an LVEF of 60 to 65% Place patient on Lasix 40 mg IV every 12 Optimize blood pressure control Continue metoprolol, losartan and hydralazine Maintain low-sodium diet Repeat 2D echocardiogram   Acute  kidney injury Unclear etiology At baseline patient has a  serum creatinine of 1.08 and on admission it was 2.1 Continue diuresis Renal parameters in a.m. Obtain renal ultrasound if no improvement in renal function   COPD with acute exacerbation Continue scheduled and as needed bronchodilator therapy Continue inhaled steroids   History coronary artery disease Continue aspirin, Plavix and statin    Peripheral neuropathy/gait dysfunction Patient has a history of peripheral neuropathy She is on gabapentin She complains of worsening lower extremity weakness which has resulted in gait dysfunction/abnormality We will request neurology consult Will request PT evaluation   DVT prophylaxis: Heparin Code Status: DNR Family Communication: Plan of care has been discussed with patient in detail. She verbalizes understanding and agrees with the plan  Disposition Plan: Back to previous home environment Consults called: Neurology    Collier Bullock MD [facility]     08/02/2019, 4:41 PM

## 2019-08-02 NOTE — ED Notes (Signed)
Nurse Secretary has put patient in for transport via [place] transport service team.

## 2019-08-03 ENCOUNTER — Inpatient Hospital Stay
Admit: 2019-08-03 | Discharge: 2019-08-03 | Disposition: A | Discharge status: Home / Self Care | Payer: Medicare Other | Attending: Internal Medicine | Admitting: Internal Medicine

## 2019-08-03 LAB — ECHOCARDIOGRAM COMPLETE
Height: 65 in
Weight: 2161.6 oz

## 2019-08-03 LAB — BASIC METABOLIC PANEL
Anion gap: 9 (ref 5–15)
BUN: 44 mg/dL — ABNORMAL HIGH (ref 8–23)
CO2: 26 mmol/L (ref 22–32)
Calcium: 8.9 mg/dL (ref 8.9–10.3)
Chloride: 102 mmol/L (ref 98–111)
Creatinine, Ser: 2.13 mg/dL — ABNORMAL HIGH (ref 0.44–1.00)
GFR calc Af Amer: 24 mL/min — ABNORMAL LOW (ref >60)
GFR calc non Af Amer: 21 mL/min — ABNORMAL LOW (ref >60)
Glucose, Bld: 171 mg/dL — ABNORMAL HIGH (ref 70–99)
Potassium: 4.6 mmol/L (ref 3.5–5.1)
Sodium: 137 mmol/L (ref 135–145)

## 2019-08-03 LAB — CBC
HCT: 39.9 % (ref 36.0–46.0)
Hemoglobin: 13.5 g/dL (ref 12.0–15.0)
MCH: 30.8 pg (ref 26.0–34.0)
MCHC: 33.8 g/dL (ref 30.0–36.0)
MCV: 90.9 fL (ref 80.0–100.0)
Platelets: 173 10*3/uL (ref 150–400)
RBC: 4.39 MIL/uL (ref 3.87–5.11)
RDW: 13 % (ref 11.5–15.5)
WBC: 5.4 10*3/uL (ref 4.0–10.5)
nRBC: 0 % (ref 0.0–0.2)

## 2019-08-03 MED ORDER — IPRATROPIUM-ALBUTEROL 0.5-2.5 (3) MG/3ML IN SOLN
3.0000 mL | RESPIRATORY_TRACT | Status: DC
  Administered 2019-08-03 – 2019-08-05 (×7): 3 mL via RESPIRATORY_TRACT
  Filled 2019-08-03 (×8): qty 3

## 2019-08-03 MED ORDER — PREDNISONE 20 MG PO TABS
40.0000 mg | ORAL_TABLET | Freq: Every day | ORAL | Status: DC
  Administered 2019-08-04 – 2019-08-06 (×3): 40 mg via ORAL
  Filled 2019-08-03 (×4): qty 2

## 2019-08-03 MED ORDER — LOPERAMIDE HCL 2 MG PO CAPS
2.0000 mg | ORAL_CAPSULE | ORAL | Status: DC | PRN
  Administered 2019-08-03 – 2019-08-04 (×2): 2 mg via ORAL
  Filled 2019-08-03 (×2): qty 1

## 2019-08-03 NOTE — Plan of Care (Signed)
  Problem: Clinical Measurements: Goal: Respiratory complications will improve Outcome: Progressing   Problem: Activity: Goal: Risk for activity intolerance will decrease Outcome: Progressing   

## 2019-08-03 NOTE — Progress Notes (Signed)
*  PRELIMINARY RESULTS* Echocardiogram 2D Echocardiogram has been performed.  Lavell Luster Kamy [place] 08/03/2019, 12:13 PM

## 2019-08-03 NOTE — Progress Notes (Signed)
PROGRESS NOTE    Tina [name]  ZES:[id number] DOB: Jun 30, 1934 DOA: 08/02/2019 PCP: Einar Pheasant, MD    Assessment & Plan:   Principal Problem:   Acute on chronic diastolic CHF (congestive heart failure) ([place]) Active Problems:   CAD (coronary artery disease)   Essential hypertension   AKI (acute kidney injury) ([place])   COPD with acute exacerbation (HCC)   Abnormality of gait    Tina [name] is a 85 y.o. female with medical history significant for COPD, hypertension, CHF, anemia of chronic disease and GERD  Who presented to the emergency room for evaluation of shortness of breath and worsening lower extremity weakness which she has had for about 3 weeks.  Shortness of breath is mostly with exertion and she has noted she is unable to climb her stairs as she usually would.   Acute hypoxic respiratory failure --on 1-2L supplemental O2, new. --tx as below  Acute on chronic diastolic dysfunction CHF Patient presents for evaluation of shortness of breath mostly with exertion Blood pressure was significantly elevated Last 2D echocardiogram showed an LVEF of 60 to 65%.  Pt was started on Lasix 40 mg IV every 12 PLAN: --Hold further diuresis for now, as Cr already trended up and pt does not appear volume overloaded --metoprolol, losartan and hydralazine --Maintain low-sodium diet  COPD with acute exacerbation --schedule DuoNeb q4h --continue steroid as prednisone 40 mg daily  Acute kidney injury Unclear etiology At baseline patient has a serum creatinine of 1.08 and on admission it was 2.1 --hold diuresis for now   History coronary artery disease Continue aspirin, Plavix and statin   Peripheral neuropathy/gait dysfunction Patient has a history of peripheral neuropathy She is on gabapentin She complains of worsening lower extremity weakness which has resulted in gait dysfunction/abnormality --d/c neurology consult ordered on admission --PT evaluation   DVT  prophylaxis: Heparin SQ Code Status: DNR  Family Communication:  Disposition Plan: from home, likely back home, once back on RA, with tx of CHF and COPD exacerbation.   Subjective and Interval History:  Pt reported dyspnea on minimum exertion.  No fever, chest pain, abdominal pain, N/V/D, dysuria.   Objective: Vitals:   08/03/19 0856 08/03/19 1223 08/03/19 1539 08/03/19 1600  BP: (!) 169/87 (!) 155/58 (!) 165/57   Pulse: 83 78 77   Resp:  18 17   Temp:  97.8 F (36.6 C) 97.7 F (36.5 C)   TempSrc:  Oral Oral   SpO2: 97% 98% 97% 94%  Weight:      Height:        Intake/Output Summary (Last 24 hours) at 08/03/2019 1825 Last data filed at 08/03/2019 0839 Gross per 24 hour  Intake -  Output 600 ml  Net -600 ml   Filed Weights   08/02/19 1157 08/02/19 1743 08/03/19 0335  Weight: 60.3 kg 59.9 kg 61.3 kg    Examination:   Constitutional: NAD, AAOx3 HEENT: conjunctivae and lids normal, EOMI CV: RRR no M,R,G. Distal pulses +2.  No cyanosis.   RESP: CTA B/L, reduced lung sounds, normal respiratory effort, on 1L GI: +BS, NTND Extremities: Edema in BLE SKIN: warm, dry and intact Neuro: II - XII grossly intact.  Sensation intact Psych: Normal mood and affect.  Appropriate judgement and reason   Data Reviewed: I have personally reviewed following labs and imaging studies  CBC: Recent Labs  Lab 08/02/19 1210 08/03/19 0540  WBC 13.3* 5.4  HGB 13.4 13.5  HCT 40.0 39.9  MCV 92.6 90.9  PLT 162 323   Basic Metabolic Panel: Recent Labs  Lab 08/02/19 1210 08/03/19 0540  NA 135 137  K 3.9 4.6  CL 99 102  CO2 22 26  GLUCOSE 135* 171*  BUN 33* 44*  CREATININE 2.10* 2.13*  CALCIUM 9.1 8.9   GFR: Estimated Creatinine Clearance: 18.3 mL/min (A) (by C-G formula based on SCr of 2.13 mg/dL (H)). Liver Function Tests: No results for input(s): AST, ALT, ALKPHOS, BILITOT, PROT, ALBUMIN in the last 168 hours. No results for input(s): LIPASE, AMYLASE in the last 168 hours.  No results for input(s): AMMONIA in the last 168 hours. Coagulation Profile: No results for input(s): INR, PROTIME in the last 168 hours. Cardiac Enzymes: No results for input(s): CKTOTAL, CKMB, CKMBINDEX, TROPONINI in the last 168 hours. BNP (last 3 results) No results for input(s): PROBNP in the last 8760 hours. HbA1C: No results for input(s): HGBA1C in the last 72 hours. CBG: No results for input(s): GLUCAP in the last 168 hours. Lipid Profile: No results for input(s): CHOL, HDL, LDLCALC, TRIG, CHOLHDL, LDLDIRECT in the last 72 hours. Thyroid Function Tests: No results for input(s): TSH, T4TOTAL, FREET4, T3FREE, THYROIDAB in the last 72 hours. Anemia Panel: No results for input(s): VITAMINB12, FOLATE, FERRITIN, TIBC, IRON, RETICCTPCT in the last 72 hours. Sepsis Labs: No results for input(s): PROCALCITON, LATICACIDVEN in the last 168 hours.  Recent Results (from the past 240 hour(s))  SARS CORONAVIRUS 2 (TAT 6-24 HRS) Nasopharyngeal Nasopharyngeal Swab     Status: None   Collection Time: 08/02/19  3:07 PM   Specimen: Nasopharyngeal Swab  Result Value Ref Range Status   SARS Coronavirus 2 NEGATIVE NEGATIVE Final    Comment: (NOTE) SARS-CoV-2 target nucleic acids are NOT DETECTED. The SARS-CoV-2 RNA is generally detectable in upper and lower respiratory specimens during the acute phase of infection. Negative results do not preclude SARS-CoV-2 infection, do not rule out co-infections with other pathogens, and should not be used as the sole basis for treatment or other patient management decisions. Negative results must be combined with clinical observations, patient history, and epidemiological information. The expected result is Negative. Fact Sheet for Patients: SugarRoll.be Fact Sheet for Healthcare Providers: https://www.woods-mathews.com/ This test is not yet approved or cleared by the [place] FDA and  has been authorized  for detection and/or diagnosis of SARS-CoV-2 by FDA under an Emergency Use Authorization (EUA). This EUA will remain  in effect (meaning this test can be used) for the duration of the COVID-19 declaration under Section 56 4(b)(1) of the Act, 21 U.S.C. section 360bbb-3(b)(1), unless the authorization is terminated or revoked sooner. Performed at [facility], [place] [address] [place], [place] [address]       Radiology Studies: DG Chest 2 View  Result Date: 08/02/2019 CLINICAL DATA:  increasing shortness of breath and weakness over last 2-3 weeks. PT states she in unable to climb her stairs at home as normal. States she has noted a drop in exertional O2 sats to low 90s. EXAM: CHEST - 2 VIEW COMPARISON:  Chest radiograph 10/20/2017 FINDINGS: The heart size and mediastinal contours are within normal limits. Aortic arch calcification. Lungs are hyperinflated chronic bilateral bronchial wall thickening. There is a small left pleural effusion and mild bibasilar opacities which could reflect scarring or atelectasis. No pneumothorax. No acute finding in the visualized skeleton. Both lungs are clear. The visualized skeletal structures are unremarkable. IMPRESSION: Small left pleural effusion and mild bibasilar opacities favored to represent scarring or atelectasis. COPD  and chronic bronchitic change. Electronically Signed   By: Audie Pinto M.D.   On: 08/02/2019 13:53   ECHOCARDIOGRAM COMPLETE  Result Date: 08/03/2019    ECHOCARDIOGRAM REPORT   Patient Name:   BROOKELLE PELLICANE Date of Exam: 08/03/2019 Medical Rec #:  [id number]     Height:       65.0 in Accession #:    [id number]    Weight:       135.1 lb Date of Birth:  06-02-1934     BSA:          1.674 m Patient Age:    50 years      BP:           183/76 mmHg Patient Gender: F             HR:           81 bpm. Exam Location:  [facility] Procedure: 2D Echo Indications:     CHF 428.31/ I50.31  History:         Patient has prior history of Echocardiogram  examinations, most                  recent 10/20/2017.  Sonographer:     Arville Go RDCS Referring Phys:  [id number] AGBATA Diagnosing Phys: Yolonda Kida MD IMPRESSIONS  1. Left ventricular ejection fraction, by estimation, is 50 to 55%. The left ventricle has low normal function. The left ventricle demonstrates global hypokinesis. Left ventricular diastolic parameters are consistent with Grade I diastolic dysfunction (impaired relaxation).  2. Right ventricular systolic function is normal. The right ventricular size is normal.  3. The mitral valve is grossly normal. Trivial mitral valve regurgitation.  4. The aortic valve is grossly normal. Aortic valve regurgitation is not visualized. FINDINGS  Left Ventricle: Left ventricular ejection fraction, by estimation, is 50 to 55%. The left ventricle has low normal function. The left ventricle demonstrates global hypokinesis. The left ventricular internal cavity size was normal in size. There is no left ventricular hypertrophy. Left ventricular diastolic parameters are consistent with Grade I diastolic dysfunction (impaired relaxation). Right Ventricle: The right ventricular size is normal. No increase in right ventricular wall thickness. Right ventricular systolic function is normal. Left Atrium: Left atrial size was normal in size. Right Atrium: Right atrial size was normal in size. Pericardium: There is no evidence of pericardial effusion. Mitral Valve: The mitral valve is grossly normal. Trivial mitral valve regurgitation. Tricuspid Valve: The tricuspid valve is normal in structure. Tricuspid valve regurgitation is mild. Aortic Valve: The aortic valve is grossly normal. Aortic valve regurgitation is not visualized. Aortic valve peak gradient measures 7.0 mmHg. Pulmonic Valve: The pulmonic valve was normal in structure. Pulmonic valve regurgitation is mild. Aorta: The aortic root is normal in size and structure. IAS/Shunts: The atrial septum is grossly  normal.  LEFT VENTRICLE PLAX 2D LVIDd:         4.96 cm     Diastology LVIDs:         3.99 cm     LV e' lateral:   5.87 cm/s LV PW:         1.03 cm     LV E/e' lateral: 15.3 LV IVS:        0.91 cm     LV e' medial:    5.87 cm/s LVOT diam:     1.70 cm     LV E/e' medial:  15.3 LV SV:  49 LV SV Index:   29 LVOT Area:     2.27 cm  LV Volumes (MOD) LV vol d, MOD A4C: 44.2 ml LV vol s, MOD A4C: 19.3 ml LV SV MOD A4C:     44.2 ml RIGHT VENTRICLE RV Basal diam:  1.90 cm RV S prime:     14.90 cm/s TAPSE (M-mode): 2.5 cm LEFT ATRIUM             Index       RIGHT ATRIUM           Index LA diam:        3.70 cm 2.21 cm/m  RA Area:     11.10 cm LA Vol (A2C):   31.0 ml 18.51 ml/m RA Volume:   22.20 ml  13.26 ml/m LA Vol (A4C):   14.9 ml 8.90 ml/m LA Biplane Vol: 22.9 ml 13.68 ml/m  AORTIC VALVE                PULMONIC VALVE AV Area (Vmax): 1.51 cm    PV Vmax:       1.06 m/s AV Vmax:        132.00 cm/s PV Peak grad:  4.5 mmHg AV Peak Grad:   7.0 mmHg LVOT Vmax:      87.90 cm/s LVOT Vmean:     52.800 cm/s LVOT VTI:       0.215 m  AORTA Ao Root diam: 2.60 cm MITRAL VALVE               TRICUSPID VALVE MV Area (PHT): 3.27 cm    TV Peak grad:   29.2 mmHg MV Decel Time: 232 msec    TV Vmax:        2.70 m/s MV E velocity: 89.70 cm/s MV A velocity: 96.00 cm/s  SHUNTS MV E/A ratio:  0.93        Systemic VTI:  0.22 m                            Systemic Diam: 1.70 cm Dwayne Prince Rome MD Electronically signed by Yolonda Kida MD Signature Date/Time: 08/03/2019/3:38:16 PM    Final      Scheduled Meds: . amLODipine  5 mg Oral BID  . aspirin EC  81 mg Oral Daily  . cholecalciferol  1,000 Units Oral Daily  . clopidogrel  75 mg Oral Daily  . fluticasone  2 spray Each Nare Daily  . gabapentin  200 mg Oral QHS  . heparin  5,000 Units Subcutaneous Q8H  . hydrALAZINE  25 mg Oral TID  . ipratropium-albuterol  3 mL Nebulization Q4H  . losartan  100 mg Oral Daily  . metoprolol tartrate  25 mg Oral BID  .  mometasone-formoterol  2 puff Inhalation BID  . potassium chloride  10 mEq Oral Daily  . pravastatin  40 mg Oral q1800  . sodium chloride flush  3 mL Intravenous Once  . cyanocobalamin  2,000 mcg Oral Daily   Continuous Infusions:   LOS: 1 day     Enzo Bi, MD [facility] If 7PM-7AM, please contact night-coverage 08/03/2019, 6:25 PM

## 2019-08-04 LAB — BASIC METABOLIC PANEL
Anion gap: 9 (ref 5–15)
BUN: 61 mg/dL — ABNORMAL HIGH (ref 8–23)
CO2: 24 mmol/L (ref 22–32)
Calcium: 8.9 mg/dL (ref 8.9–10.3)
Chloride: 106 mmol/L (ref 98–111)
Creatinine, Ser: 1.99 mg/dL — ABNORMAL HIGH (ref 0.44–1.00)
GFR calc Af Amer: 26 mL/min — ABNORMAL LOW (ref >60)
GFR calc non Af Amer: 23 mL/min — ABNORMAL LOW (ref >60)
Glucose, Bld: 141 mg/dL — ABNORMAL HIGH (ref 70–99)
Potassium: 4.3 mmol/L (ref 3.5–5.1)
Sodium: 139 mmol/L (ref 135–145)

## 2019-08-04 LAB — CBC
HCT: 38.9 % (ref 36.0–46.0)
Hemoglobin: 13.4 g/dL (ref 12.0–15.0)
MCH: 31.2 pg (ref 26.0–34.0)
MCHC: 34.4 g/dL (ref 30.0–36.0)
MCV: 90.7 fL (ref 80.0–100.0)
Platelets: 228 10*3/uL (ref 150–400)
RBC: 4.29 MIL/uL (ref 3.87–5.11)
RDW: 13.2 % (ref 11.5–15.5)
WBC: 13.7 10*3/uL — ABNORMAL HIGH (ref 4.0–10.5)
nRBC: 0 % (ref 0.0–0.2)

## 2019-08-04 LAB — MAGNESIUM: Magnesium: 2.5 mg/dL — ABNORMAL HIGH (ref 1.7–2.4)

## 2019-08-04 MED ORDER — HYDRALAZINE HCL 50 MG PO TABS
50.0000 mg | ORAL_TABLET | Freq: Three times a day (TID) | ORAL | Status: DC
  Administered 2019-08-04 – 2019-08-05 (×4): 50 mg via ORAL
  Filled 2019-08-04 (×4): qty 1

## 2019-08-04 MED ORDER — METOPROLOL TARTRATE 50 MG PO TABS
50.0000 mg | ORAL_TABLET | Freq: Two times a day (BID) | ORAL | Status: DC
  Administered 2019-08-04 – 2019-08-05 (×3): 50 mg via ORAL
  Filled 2019-08-04 (×3): qty 1

## 2019-08-04 MED ORDER — TORSEMIDE 20 MG PO TABS: 20.0000 mg | ORAL_TABLET | Freq: Every morning | ORAL | Status: DC

## 2019-08-04 MED ORDER — AMLODIPINE BESYLATE 10 MG PO TABS
10.0000 mg | ORAL_TABLET | Freq: Every day | ORAL | Status: DC
  Administered 2019-08-04 – 2019-08-06 (×3): 10 mg via ORAL
  Filled 2019-08-04 (×3): qty 1

## 2019-08-04 NOTE — Plan of Care (Signed)
  Problem: Clinical Measurements: Goal: Respiratory complications will improve Outcome: Progressing   Problem: Activity: Goal: Risk for activity intolerance will decrease Outcome: Progressing   

## 2019-08-04 NOTE — Progress Notes (Signed)
PROGRESS NOTE    Tina [name]  AOZ:[id number] DOB: 1934/12/07 DOA: 08/02/2019 PCP: Einar Pheasant, MD    Assessment & Plan:   Principal Problem:   Acute on chronic diastolic CHF (congestive heart failure) ([place]) Active Problems:   CAD (coronary artery disease)   Essential hypertension   AKI (acute kidney injury) ([place])   COPD with acute exacerbation (HCC)   Abnormality of gait    Tina [name] is a 85 y.o. female with medical history significant for COPD, hypertension, CHF, anemia of chronic disease and GERD  Who presented to the emergency room for evaluation of shortness of breath and worsening lower extremity weakness which she has had for about 3 weeks.  Shortness of breath is mostly with exertion and she has noted she is unable to climb her stairs as she usually would.   Acute hypoxic respiratory failure, resolved Persistent dyspnea on exertion --Patient presents for evaluation of shortness of breath mostly with exertion.  On presentation, needed 1-2L supplemental O2, now on RA even with walking.  Does not qualify for home O2.  Persistent DOE may be part deconditioning, and part progression of COPD. --tx as below   COPD with acute exacerbation --schedule DuoNeb q4h --continue steroid as prednisone 40 mg daily  Chronic diastolic dysfunction CHF, does not appear to be in exacerbation --Last 2D echocardiogram showed an LVEF of 60 to 65%.  TTE during this admission showed LVEF 50-55% with grade I diastolic dysfunction.  Pt received IV Lasix 40 mg x2, and Cr already trended up.  Pt did not appear to be volume overloaded, and CXR showed both lungs clear. PLAN: --Hold further diuresis for now, as Cr already trended up and pt does not appear volume overloaded --continue metoprolol, losartan and hydralazine --Maintain low-sodium diet  Acute kidney injury Unclear etiology At baseline patient has a serum creatinine of 1.08 and on admission it was 2.1 --hold diuresis for now  HTN  BP uncontrolled. --increase hydralazine to 50 mg TID, metop to 50 mg BID --continue amlodipine as 10 mg daily --continue hom Losartan  History coronary artery disease Continue aspirin, Plavix and statin  Peripheral neuropathy/gait dysfunction Patient has a history of peripheral neuropathy She is on gabapentin She complains of worsening lower extremity weakness which has resulted in gait dysfunction/abnormality --d/c neurology consult ordered on admission --PT evaluation   DVT prophylaxis: Heparin SQ Code Status: DNR  Family Communication:  Disposition Plan: from home, likely back home, once back on RA, with tx of CHF and COPD exacerbation.  Likely tomorrow.   Subjective and Interval History:  Pt reported no improvement in her breathing, and still gets very short of breath just walking to the bathroom.  No fever, chest pain, abdominal pain, N/V/D, dysuria, increased swelling.  Nurse ambulation test showed pt sating 93% in RA while ambulating.     Objective: Vitals:   08/04/19 0804 08/04/19 1155 08/04/19 1206 08/04/19 1552  BP: (!) 156/65 (!) 167/65    Pulse: 88 79    Resp: 19 18    Temp: 97.7 F (36.5 C) 97.6 F (36.4 C)    TempSrc: Oral Oral    SpO2: 94% 95% 96% 93%  Weight:      Height:        Intake/Output Summary (Last 24 hours) at 08/04/2019 1628 Last data filed at 08/04/2019 1345 Gross per 24 hour  Intake 960 ml  Output 400 ml  Net 560 ml   Filed Weights   08/02/19 1743 08/03/19 0335  08/04/19 0427  Weight: 59.9 kg 61.3 kg 61.8 kg    Examination:   Constitutional: NAD, AAOx3 HEENT: conjunctivae and lids normal, EOMI CV: RRR no M,R,G. Distal pulses +2.  No cyanosis.   RESP: CTA B/L, reduced lung sounds, normal respiratory effort, on 1L GI: +BS, NTND Extremities: Edema in BLE SKIN: warm, dry and intact Neuro: II - XII grossly intact.  Sensation intact Psych: Normal mood and affect.  Appropriate judgement and reason   Data Reviewed: I have  personally reviewed following labs and imaging studies  CBC: Recent Labs  Lab 08/02/19 1210 08/03/19 0540 08/04/19 0513  WBC 13.3* 5.4 13.7*  HGB 13.4 13.5 13.4  HCT 40.0 39.9 38.9  MCV 92.6 90.9 90.7  PLT 162 173 751   Basic Metabolic Panel: Recent Labs  Lab 08/02/19 1210 08/03/19 0540 08/04/19 0513  NA 135 137 139  K 3.9 4.6 4.3  CL 99 102 106  CO2 22 26 24   GLUCOSE 135* 171* 141*  BUN 33* 44* 61*  CREATININE 2.10* 2.13* 1.99*  CALCIUM 9.1 8.9 8.9  MG  --   --  2.5*   GFR: Estimated Creatinine Clearance: 19.6 mL/min (A) (by C-G formula based on SCr of 1.99 mg/dL (H)). Liver Function Tests: No results for input(s): AST, ALT, ALKPHOS, BILITOT, PROT, ALBUMIN in the last 168 hours. No results for input(s): LIPASE, AMYLASE in the last 168 hours. No results for input(s): AMMONIA in the last 168 hours. Coagulation Profile: No results for input(s): INR, PROTIME in the last 168 hours. Cardiac Enzymes: No results for input(s): CKTOTAL, CKMB, CKMBINDEX, TROPONINI in the last 168 hours. BNP (last 3 results) No results for input(s): PROBNP in the last 8760 hours. HbA1C: No results for input(s): HGBA1C in the last 72 hours. CBG: No results for input(s): GLUCAP in the last 168 hours. Lipid Profile: No results for input(s): CHOL, HDL, LDLCALC, TRIG, CHOLHDL, LDLDIRECT in the last 72 hours. Thyroid Function Tests: No results for input(s): TSH, T4TOTAL, FREET4, T3FREE, THYROIDAB in the last 72 hours. Anemia Panel: No results for input(s): VITAMINB12, FOLATE, FERRITIN, TIBC, IRON, RETICCTPCT in the last 72 hours. Sepsis Labs: No results for input(s): PROCALCITON, LATICACIDVEN in the last 168 hours.  Recent Results (from the past 240 hour(s))  SARS CORONAVIRUS 2 (TAT 6-24 HRS) Nasopharyngeal Nasopharyngeal Swab     Status: None   Collection Time: 08/02/19  3:07 PM   Specimen: Nasopharyngeal Swab  Result Value Ref Range Status   SARS Coronavirus 2 NEGATIVE NEGATIVE Final     Comment: (NOTE) SARS-CoV-2 target nucleic acids are NOT DETECTED. The SARS-CoV-2 RNA is generally detectable in upper and lower respiratory specimens during the acute phase of infection. Negative results do not preclude SARS-CoV-2 infection, do not rule out co-infections with other pathogens, and should not be used as the sole basis for treatment or other patient management decisions. Negative results must be combined with clinical observations, patient history, and epidemiological information. The expected result is Negative. Fact Sheet for Patients: SugarRoll.be Fact Sheet for Healthcare Providers: https://www.woods-mathews.com/ This test is not yet approved or cleared by the [place] FDA and  has been authorized for detection and/or diagnosis of SARS-CoV-2 by FDA under an Emergency Use Authorization (EUA). This EUA will remain  in effect (meaning this test can be used) for the duration of the COVID-19 declaration under Section 56 4(b)(1) of the Act, 21 U.S.C. section 360bbb-3(b)(1), unless the authorization is terminated or revoked sooner. Performed at [facility], [address] [place], [place] [address]       Radiology Studies: ECHOCARDIOGRAM COMPLETE  Result Date: 08/03/2019    ECHOCARDIOGRAM REPORT   Patient Name:   Tina [name] Date of Exam: 08/03/2019 Medical Rec #:  [id number]     Height:       65.0 in Accession #:    [id number]    Weight:       135.1 lb Date of Birth:  May 19, 1934     BSA:          1.674 m Patient Age:    57 years      BP:           183/76 mmHg Patient Gender: F             HR:           81 bpm. Exam Location:  [facility] Procedure: 2D Echo Indications:     CHF 428.31/ I50.31  History:         Patient has prior history of Echocardiogram examinations, most                  recent 10/20/2017.  Sonographer:     Arville Go RDCS Referring Phys:  [id number] AGBATA Diagnosing Phys: Yolonda Kida MD  IMPRESSIONS  1. Left ventricular ejection fraction, by estimation, is 50 to 55%. The left ventricle has low normal function. The left ventricle demonstrates global hypokinesis. Left ventricular diastolic parameters are consistent with Grade I diastolic dysfunction (impaired relaxation).  2. Right ventricular systolic function is normal. The right ventricular size is normal.  3. The mitral valve is grossly normal. Trivial mitral valve regurgitation.  4. The aortic valve is grossly normal. Aortic valve regurgitation is not visualized. FINDINGS  Left Ventricle: Left ventricular ejection fraction, by estimation, is 50 to 55%. The left ventricle has low normal function. The left ventricle demonstrates global hypokinesis. The left ventricular internal cavity size was normal in size. There is no left ventricular hypertrophy. Left ventricular diastolic parameters are consistent with Grade I diastolic dysfunction (impaired relaxation). Right Ventricle: The right ventricular size is normal. No increase in right ventricular wall thickness. Right ventricular systolic function is normal. Left Atrium: Left atrial size was normal in size. Right Atrium: Right atrial size was normal in size. Pericardium: There is no evidence of pericardial effusion. Mitral Valve: The mitral valve is grossly normal. Trivial mitral valve regurgitation. Tricuspid Valve: The tricuspid valve is normal in structure. Tricuspid valve regurgitation is mild. Aortic Valve: The aortic valve is grossly normal. Aortic valve regurgitation is not visualized. Aortic valve peak gradient measures 7.0 mmHg. Pulmonic Valve: The pulmonic valve was normal in structure. Pulmonic valve regurgitation is mild. Aorta: The aortic root is normal in size and structure. IAS/Shunts: The atrial septum is grossly normal.  LEFT VENTRICLE PLAX 2D LVIDd:         4.96 cm     Diastology LVIDs:         3.99 cm     LV e' lateral:   5.87 cm/s LV PW:         1.03 cm     LV E/e' lateral: 15.3  LV IVS:        0.91 cm     LV e' medial:    5.87 cm/s LVOT diam:     1.70 cm     LV E/e' medial:  15.3 LV SV:         49 LV SV Index:  29 LVOT Area:     2.27 cm  LV Volumes (MOD) LV vol d, MOD A4C: 44.2 ml LV vol s, MOD A4C: 19.3 ml LV SV MOD A4C:     44.2 ml RIGHT VENTRICLE RV Basal diam:  1.90 cm RV S prime:     14.90 cm/s TAPSE (M-mode): 2.5 cm LEFT ATRIUM             Index       RIGHT ATRIUM           Index LA diam:        3.70 cm 2.21 cm/m  RA Area:     11.10 cm LA Vol (A2C):   31.0 ml 18.51 ml/m RA Volume:   22.20 ml  13.26 ml/m LA Vol (A4C):   14.9 ml 8.90 ml/m LA Biplane Vol: 22.9 ml 13.68 ml/m  AORTIC VALVE                PULMONIC VALVE AV Area (Vmax): 1.51 cm    PV Vmax:       1.06 m/s AV Vmax:        132.00 cm/s PV Peak grad:  4.5 mmHg AV Peak Grad:   7.0 mmHg LVOT Vmax:      87.90 cm/s LVOT Vmean:     52.800 cm/s LVOT VTI:       0.215 m  AORTA Ao Root diam: 2.60 cm MITRAL VALVE               TRICUSPID VALVE MV Area (PHT): 3.27 cm    TV Peak grad:   29.2 mmHg MV Decel Time: 232 msec    TV Vmax:        2.70 m/s MV E velocity: 89.70 cm/s MV A velocity: 96.00 cm/s  SHUNTS MV E/A ratio:  0.93        Systemic VTI:  0.22 m                            Systemic Diam: 1.70 cm Dwayne Prince Rome MD Electronically signed by Yolonda Kida MD Signature Date/Time: 08/03/2019/3:38:16 PM    Final      Scheduled Meds: . amLODipine  10 mg Oral Daily  . aspirin EC  81 mg Oral Daily  . cholecalciferol  1,000 Units Oral Daily  . clopidogrel  75 mg Oral Daily  . fluticasone  2 spray Each Nare Daily  . gabapentin  200 mg Oral QHS  . heparin  5,000 Units Subcutaneous Q8H  . hydrALAZINE  50 mg Oral TID  . ipratropium-albuterol  3 mL Nebulization Q4H  . losartan  100 mg Oral Daily  . metoprolol tartrate  50 mg Oral BID  . mometasone-formoterol  2 puff Inhalation BID  . potassium chloride  10 mEq Oral Daily  . pravastatin  40 mg Oral q1800  . predniSONE  40 mg Oral Q breakfast  . sodium chloride flush   3 mL Intravenous Once  . cyanocobalamin  2,000 mcg Oral Daily   Continuous Infusions:   LOS: 2 days     Enzo Bi, MD [facility] If 7PM-7AM, please contact night-coverage 08/04/2019, 4:28 PM

## 2019-08-04 NOTE — Progress Notes (Signed)
SATURATION QUALIFICATIONS: (This note is used to comply with regulatory documentation for home oxygen)  Patient Saturations on Room Air at Rest = 94%  Patient Saturations on Room Air while Ambulating = 93%   

## 2019-08-05 ENCOUNTER — Inpatient Hospital Stay: Payer: Medicare Other

## 2019-08-05 LAB — BASIC METABOLIC PANEL
Anion gap: 11 (ref 5–15)
BUN: 85 mg/dL — ABNORMAL HIGH (ref 8–23)
CO2: 22 mmol/L (ref 22–32)
Calcium: 8.9 mg/dL (ref 8.9–10.3)
Chloride: 104 mmol/L (ref 98–111)
Creatinine, Ser: 2.24 mg/dL — ABNORMAL HIGH (ref 0.44–1.00)
GFR calc Af Amer: 23 mL/min — ABNORMAL LOW (ref >60)
GFR calc non Af Amer: 20 mL/min — ABNORMAL LOW (ref >60)
Glucose, Bld: 144 mg/dL — ABNORMAL HIGH (ref 70–99)
Potassium: 4.3 mmol/L (ref 3.5–5.1)
Sodium: 137 mmol/L (ref 135–145)

## 2019-08-05 LAB — NA AND K (SODIUM & POTASSIUM), RAND UR
Potassium Urine: 42 mmol/L
Sodium, Ur: 18 mmol/L

## 2019-08-05 LAB — CBC
HCT: 38.6 % (ref 36.0–46.0)
Hemoglobin: 13.3 g/dL (ref 12.0–15.0)
MCH: 31.6 pg (ref 26.0–34.0)
MCHC: 34.5 g/dL (ref 30.0–36.0)
MCV: 91.7 fL (ref 80.0–100.0)
Platelets: 259 10*3/uL (ref 150–400)
RBC: 4.21 MIL/uL (ref 3.87–5.11)
RDW: 13.2 % (ref 11.5–15.5)
WBC: 12.5 10*3/uL — ABNORMAL HIGH (ref 4.0–10.5)
nRBC: 0 % (ref 0.0–0.2)

## 2019-08-05 LAB — MAGNESIUM: Magnesium: 2.5 mg/dL — ABNORMAL HIGH (ref 1.7–2.4)

## 2019-08-05 MED ORDER — METOPROLOL TARTRATE 50 MG PO TABS
50.0000 mg | ORAL_TABLET | Freq: Once | ORAL | Status: AC
  Administered 2019-08-05: 50 mg via ORAL
  Filled 2019-08-05: qty 1

## 2019-08-05 MED ORDER — HYDRALAZINE HCL 50 MG PO TABS
50.0000 mg | ORAL_TABLET | Freq: Once | ORAL | Status: AC
  Administered 2019-08-05: 50 mg via ORAL
  Filled 2019-08-05: qty 1

## 2019-08-05 MED ORDER — ALBUTEROL SULFATE (2.5 MG/3ML) 0.083% IN NEBU: 2.5000 mg | INHALATION_SOLUTION | RESPIRATORY_TRACT | Status: DC | PRN

## 2019-08-05 MED ORDER — IPRATROPIUM-ALBUTEROL 0.5-2.5 (3) MG/3ML IN SOLN
3.0000 mL | Freq: Four times a day (QID) | RESPIRATORY_TRACT | Status: DC
  Administered 2019-08-05 – 2019-08-06 (×4): 3 mL via RESPIRATORY_TRACT
  Filled 2019-08-05 (×4): qty 3

## 2019-08-05 MED ORDER — METOPROLOL TARTRATE 50 MG PO TABS
100.0000 mg | ORAL_TABLET | Freq: Two times a day (BID) | ORAL | Status: DC
  Administered 2019-08-05 – 2019-08-06 (×2): 100 mg via ORAL
  Filled 2019-08-05 (×2): qty 2

## 2019-08-05 MED ORDER — HYDRALAZINE HCL 50 MG PO TABS
100.0000 mg | ORAL_TABLET | Freq: Three times a day (TID) | ORAL | Status: DC
  Administered 2019-08-05 – 2019-08-06 (×3): 100 mg via ORAL
  Filled 2019-08-05 (×3): qty 2

## 2019-08-05 NOTE — Consult Note (Signed)
[address] [place], [place] [address] Phone [phone]. Fax [phone]  Date: 08/05/2019                  Patient Name:  Tina [name]  MRN: [id number]  DOB: 11/10/1934  Age / Sex: 85 y.o., female         PCP: Einar Pheasant, MD                 Service Requesting Consult: IM/ Enzo Bi, MD                 Reason for Consult: ARF            History of Present Illness: [name] with medical problems of COPD, hypertension, CHF, GERD, who was admitted to [facility] on 08/02/2019 for evaluation of difficulty breathing and dyspnea on exertion.  Patient reports getting short of breath going to the bathroom or going up to the door.  She reports she is coughing but not able to bring up sputum.  No complaints of hemoptysis She is a former smoker quit 20 years ago Baseline creatinine of 1.08/GFR 48 from June 09, 2019 Admission creatinine of 2.10, GFR 29 Patient had IV contrast exposure for a CT abdomen pelvis and chest with contrast on April 22, 2019 Repeat IV contrast exposure for aortogram on June 09, 2019 Results demonstrated severe 70-75% left right renal artery stenosis, PTA of SMA artery with stent placement. 45 cc contrast used  Medications: Outpatient medications: Medications Prior to Admission  Medication Sig Dispense Refill Last Dose  . amLODipine (NORVASC) 5 MG tablet TAKE 1 TABLET BY MOUTH  TWICE DAILY (Patient taking differently: Take 5 mg by mouth 2 (two) times daily. ) 180 tablet 3 08/01/2019 at Unknown time  . aspirin EC 81 MG tablet Take 1 tablet (81 mg total) by mouth daily. 150 tablet 2 08/01/2019 at Unknown time  . cholecalciferol (VITAMIN D) 1000 units tablet Take 1,000 Units by mouth daily.   08/01/2019 at Unknown time  . clopidogrel (PLAVIX) 75 MG tablet Take 1 tablet (75 mg total) by mouth daily. 30 tablet 0 08/01/2019 at Unknown time  . cyanocobalamin 1000 MCG tablet Take 2,000 mcg by mouth daily.   08/01/2019 at Unknown time  .  fluticasone (FLONASE) 50 MCG/ACT nasal spray USE 2 SPRAYS INTO BOTH  NOSTRILS DAILY AS NEEDED  FOR ALLERGIES. (Patient taking differently: Place 2 sprays into both nostrils daily. ) 48 g 1 08/01/2019 at Unknown time  . gabapentin (NEURONTIN) 100 MG capsule Take 2 capsules by mouth at bedtime.   08/01/2019 at Unknown time  . hydrALAZINE (APRESOLINE) 25 MG tablet TAKE ONE TABLET BY MOUTH THREE TIMES DAILY 90 tablet 0 08/01/2019 at Unknown time  . losartan (COZAAR) 100 MG tablet TAKE 1 TABLET BY MOUTH ONCE DAILY (Patient taking differently: Take 100 mg by mouth daily. TAKE 1 TABLET BY MOUTH ONCE DAILY) 90 tablet 3 08/01/2019 at Unknown time  . lovastatin (MEVACOR) 40 MG tablet TAKE 1 TABLET BY MOUTH  DAILY IN THE EVENING 90 tablet 3 08/01/2019 at Unknown time  . metoprolol tartrate (LOPRESSOR) 25 MG tablet TAKE 1 TABLET BY MOUTH  TWICE DAILY (Patient taking differently: Take 25 mg by mouth 2 (two) times daily. ) 180 tablet 3 08/01/2019 at Unknown time  . potassium chloride (KLOR-CON) 10 MEQ tablet TAKE 1 TABLET BY MOUTH  DAILY 90 tablet 3 08/01/2019 at Unknown time  . SYMBICORT 160-4.5 MCG/ACT inhaler USE 2  PUFFS BY MOUTH TWO  TIMES DAILY 30.6 g 3 08/01/2019 at Unknown time  . torsemide (DEMADEX) 20 MG tablet Take 20 mg by mouth every morning.  11 08/01/2019 at Unknown time  . albuterol (PROVENTIL HFA;VENTOLIN HFA) 108 (90 Base) MCG/ACT inhaler Inhale 2 puffs into the lungs every 6 (six) hours as needed for wheezing or shortness of breath. 1 Inhaler 2 PRN at PRN  . ipratropium-albuterol (DUONEB) 0.5-2.5 (3) MG/3ML SOLN Take 3 mLs by nebulization every 6 (six) hours as needed (for shortness of breath). 36 mL 0 PRN at PRN    Current medications: Current Facility-Administered Medications  Medication Dose Route Frequency Provider Last Rate Last Admin  . acetaminophen (TYLENOL) tablet 650 mg  650 mg Oral Q6H PRN Agbata, Tochukwu, MD       Or  . acetaminophen (TYLENOL) suppository 650 mg  650 mg Rectal Q6H PRN  Agbata, Tochukwu, MD      . albuterol (PROVENTIL) (2.5 MG/3ML) 0.083% nebulizer solution 2.5 mg  2.5 mg Nebulization Q3H PRN Enzo Bi, MD      . amLODipine (NORVASC) tablet 10 mg  10 mg Oral Daily Enzo Bi, MD   10 mg at 08/05/19 0845  . aspirin EC tablet 81 mg  81 mg Oral Daily Agbata, Tochukwu, MD   81 mg at 08/05/19 0844  . cholecalciferol (VITAMIN D3) tablet 1,000 Units  1,000 Units Oral Daily Agbata, Tochukwu, MD   1,000 Units at 08/05/19 0846  . clopidogrel (PLAVIX) tablet 75 mg  75 mg Oral Daily Agbata, Tochukwu, MD   75 mg at 08/05/19 0846  . fluticasone (FLONASE) 50 MCG/ACT nasal spray 2 spray  2 spray Each Nare Daily Agbata, Tochukwu, MD      . gabapentin (NEURONTIN) capsule 200 mg  200 mg Oral QHS Agbata, Tochukwu, MD   200 mg at 08/04/19 2052  . heparin injection 5,000 Units  5,000 Units Subcutaneous Q8H Agbata, Tochukwu, MD   5,000 Units at 08/05/19 0449  . hydrALAZINE (APRESOLINE) injection 10 mg  10 mg Intravenous Q6H PRN Agbata, Tochukwu, MD   10 mg at 08/04/19 0452  . hydrALAZINE (APRESOLINE) tablet 100 mg  100 mg Oral TID Enzo Bi, MD      . ipratropium-albuterol (DUONEB) 0.5-2.5 (3) MG/3ML nebulizer solution 3 mL  3 mL Nebulization Q6H Enzo Bi, MD      . loperamide (IMODIUM) capsule 2 mg  2 mg Oral PRN Enzo Bi, MD   2 mg at 08/04/19 1748  . metoprolol tartrate (LOPRESSOR) tablet 100 mg  100 mg Oral BID Enzo Bi, MD      . mometasone-formoterol [facility]) 200-5 MCG/ACT inhaler 2 puff  2 puff Inhalation BID Francine Graven, Tochukwu, MD   2 puff at 08/05/19 0845  . ondansetron (ZOFRAN) tablet 4 mg  4 mg Oral Q6H PRN Agbata, Tochukwu, MD       Or  . ondansetron (ZOFRAN) injection 4 mg  4 mg Intravenous Q6H PRN Agbata, Tochukwu, MD      . pravastatin (PRAVACHOL) tablet 40 mg  40 mg Oral q1800 Agbata, Tochukwu, MD   40 mg at 08/04/19 1745  . predniSONE (DELTASONE) tablet 40 mg  40 mg Oral Q breakfast Enzo Bi, MD   40 mg at 08/05/19 0846  . sodium chloride flush (NS) 0.9 % injection 3  mL  3 mL Intravenous Once Earleen Newport, MD      . vitamin B-12 (CYANOCOBALAMIN) tablet 2,000 mcg  2,000 mcg Oral Daily Agbata, Tochukwu, MD  2,000 mcg at 08/05/19 0845   Facility-Administered Medications Ordered in Other Encounters  Medication Dose Route Frequency Provider Last Rate Last Admin  . heparin lock flush 100 unit/mL  500 Units Intravenous Once Sindy Guadeloupe, MD      . sodium chloride flush (NS) 0.9 % injection 10 mL  10 mL Intravenous PRN Sindy Guadeloupe, MD          Allergies: Allergies  Allergen Reactions  . Evista [Raloxifene] Other (See Comments)    Night sweats   . Fluticasone-Salmeterol Other (See Comments)    Cough, "chokes me"       Past Medical History: Past Medical History:  Diagnosis Date  . Anemia   . Asthma   . Bladder cancer ([place])   . BRCA positive   . CAD (coronary artery disease)   . CHF (congestive heart failure) ([place])   . COPD (chronic obstructive pulmonary disease) ([place])   . Emphysema lung ([place])   . GERD (gastroesophageal reflux disease)   . History of colon polyps   . Hypercholesterolemia   . Hyperglycemia   . Hypertension   . Lung nodules   . Osteoporosis   . Personal history of tobacco use, presenting hazards to health 11/25/2014  . Polycythemia vera(238.4)   . Renal cyst   . Skin cancer      Past Surgical History: Past Surgical History:  Procedure Laterality Date  . ANGIOPLASTY     coronary (x1)  . COLONOSCOPY WITH PROPOFOL N/A 03/02/2015   Procedure: COLONOSCOPY WITH PROPOFOL;  Surgeon: Lollie Sails, MD;  Location[facility] ENDOSCOPY;  Service: Endoscopy;  Laterality: N/A;  . COLONOSCOPY WITH PROPOFOL N/A 02/13/2017   Procedure: COLONOSCOPY WITH PROPOFOL;  Surgeon: [name], MD;  Location: [facility] ENDOSCOPY;  Service: Endoscopy;  Laterality: N/A;  . CORONARY ANGIOPLASTY    . CORONARY ARTERY BYPASS GRAFT  09/17/2017   pt denies  . CYSTOSCOPY W/ RETROGRADES Bilateral 02/13/2017   Procedure: CYSTOSCOPY WITH RETROGRADE  PYELOGRAM;  Surgeon: Hollice Espy, MD;  Location: [facility] ORS;  Service: Urology;  Laterality: Bilateral;  . CYSTOSCOPY W/ RETROGRADES Bilateral 09/19/2017   Procedure: CYSTOSCOPY WITH RETROGRADE PYELOGRAM;  Surgeon: Hollice Espy, MD;  Location: [facility] ORS;  Service: Urology;  Laterality: Bilateral;  . CYSTOSCOPY WITH BIOPSY  03/26/2017   Procedure: CYSTOSCOPY WITH BIOPSY;  Surgeon: Hollice Espy, MD;  Location: [facility] ORS;  Service: Urology;;  . Consuela Mimes WITH STENT PLACEMENT Bilateral 02/13/2017   Procedure: CYSTOSCOPY WITH STENT PLACEMENT;  Surgeon: Hollice Espy, MD;  Location: [facility] ORS;  Service: Urology;  Laterality: Bilateral;  . CYSTOSCOPY WITH STENT PLACEMENT Bilateral 03/26/2017   Procedure: CYSTOSCOPY WITH STENT EXCHANGE;  Surgeon: Hollice Espy, MD;  Location: [facility] ORS;  Service: Urology;  Laterality: Bilateral;  . ESOPHAGOGASTRODUODENOSCOPY (EGD) WITH PROPOFOL N/A 02/13/2017   Procedure: ESOPHAGOGASTRODUODENOSCOPY (EGD) WITH PROPOFOL;  Surgeon: [name], MD;  Location[facility]COPY;  Service: Endoscopy;  Laterality: N/A;  . PORTA CATH INSERTION N/A 02/28/2017   Procedure: PORTA CATH INSERTION;  Surgeon: Algernon Huxley, MD;  Location: [place] CV LAB;  Service: Cardiovascular;  Laterality: N/A;  . PORTA CATH REMOVAL N/A 12/24/2017   Procedure: PORTA CATH REMOVAL;  Surgeon: Algernon Huxley, MD;  Location: [place] CV LAB;  Service: Cardiovascular;  Laterality: N/A;  . SALPINGOOPHORECTOMY    . TONSILECTOMY/ADENOIDECTOMY WITH MYRINGOTOMY    . TONSILLECTOMY    . TRANSURETHRAL RESECTION OF BLADDER TUMOR N/A 02/13/2017   Procedure: TRANSURETHRAL RESECTION OF BLADDER TUMOR (TURBT);  Surgeon: Erlene Quan,  Caryl Pina, MD;  Location: [facility] ORS;  Service: Urology;  Laterality: N/A;  . TUBAL LIGATION    . UPPER GI ENDOSCOPY  02/13/2017  . URETEROSCOPY Left 09/19/2017   Procedure: URETEROSCOPY;  Surgeon: Hollice Espy, MD;  Location: [facility] ORS;  Service: Urology;  Laterality: Left;  Marland Kitchen  VISCERAL ANGIOGRAPHY N/A 01/23/2019   Procedure: VISCERAL ANGIOGRAPHY;  Surgeon: Algernon Huxley, MD;  Location: [place] CV LAB;  Service: Cardiovascular;  Laterality: N/A;  . VISCERAL ANGIOGRAPHY N/A 06/09/2019   Procedure: VISCERAL ANGIOGRAPHY;  Surgeon: Algernon Huxley, MD;  Location: [place] CV LAB;  Service: Cardiovascular;  Laterality: N/A;  . VISCERAL ARTERY INTERVENTION N/A 02/12/2017   Procedure: VISCERAL ARTERY INTERVENTION;  Surgeon: Algernon Huxley, MD;  Location: [place] CV LAB;  Service: Cardiovascular;  Laterality: N/A;     Family History: Family History  Problem Relation Age of Onset  . Cirrhosis Father        died age 45  . Alcohol abuse Father   . Asthma Mother   . Congestive Heart Failure Mother   . Breast cancer Mother        2 (1/2 sisters)  . Osteoarthritis Mother   . Colon cancer Mother   . Lupus Sister   . Alcohol abuse Sister   . Ovarian cancer Sister   . Osteoporosis Sister   . Skin cancer Sister   . Breast cancer Cousin   . Breast cancer Maternal Aunt      Social History: Social History   Socioeconomic History  . Marital status: Widowed    Spouse name: Not on file  . Number of children: 2  . Years of education: Not on file  . Highest education level: Not on file  Occupational History  . Not on file  Tobacco Use  . Smoking status: Former Smoker    Packs/day: 1.00    Years: 45.00    Pack years: 45.00    Types: Cigarettes    Quit date: 11/11/2003    Years since quitting: 15.7  . Smokeless tobacco: Never Used  Substance and Sexual Activity  . Alcohol use: Not Currently    Alcohol/week: 0.0 standard drinks  . Drug use: No  . Sexual activity: Never  Other Topics Concern  . Not on file  Social History Narrative  . Not on file   Social Determinants of Health   Financial Resource Strain:   . Difficulty of Paying Living Expenses:   Food Insecurity: No Food Insecurity  . Worried About Charity fundraiser in the Last Year: Never  true  . Ran Out of Food in the Last Year: Never true  Transportation Needs: No Transportation Needs  . Lack of Transportation (Medical): No  . Lack of Transportation (Non-Medical): No  Physical Activity:   . Days of Exercise per Week:   . Minutes of Exercise per Session:   Stress:   . Feeling of Stress :   Social Connections:   . Frequency of Communication with Friends and Family:   . Frequency of Social Gatherings with Friends and Family:   . Attends Religious Services:   . Active Member of Clubs or Organizations:   . Attends Archivist Meetings:   Marland Kitchen Marital Status:   Intimate Partner Violence:   . Fear of Current or Ex-Partner:   . Emotionally Abused:   Marland Kitchen Physically Abused:   . Sexually Abused:      Review of Systems: Gen: Denies any fevers or chills HEENT:  Denies vision or hearing complaints CV: Reports shortness of breath Resp: Reports cough, no sputum GI: No nausea or vomiting.  Appetite is good.  No diarrhea or emesis GU : No hematuria or history of kidney stones MS: Denies any acute complaints Derm:    No complaints Psych: No complaints Heme: No complaints Neuro: No complaints Endocrine: No complaints  Vital Signs: Blood pressure (!) 171/68, pulse 82, temperature 97.8 F (36.6 C), temperature source Oral, resp. rate 19, height '5\' 5"'  (1.651 m), weight 62.6 kg, SpO2 93 %.   Intake/Output Summary (Last 24 hours) at 08/05/2019 1333 Last data filed at 08/05/2019 1259 Gross per 24 hour  Intake 960 ml  Output 500 ml  Net 460 ml    Weight trends: Filed Weights   08/03/19 0335 08/04/19 0427 08/05/19 0450  Weight: 61.3 kg 61.8 kg 62.6 kg    Physical Exam: General:  No acute distress, laying in the bed  HEENT  anicteric, moist oral mucous membranes  Lungs:  Normal breathing effort, clear to auscultation  Heart::  No rub or gallop  Abdomen:  Soft, nontender  Extremities:  Trace to 1+ dependent edema  Neurologic:  Alert, oriented  Skin:  No acute  rashes    Lab results: Basic Metabolic Panel: Recent Labs  Lab 08/03/19 0540 08/04/19 0513 08/05/19 0547  NA 137 139 137  K 4.6 4.3 4.3  CL 102 106 104  CO2 '26 24 22  ' GLUCOSE 171* 141* 144*  BUN 44* 61* 85*  CREATININE 2.13* 1.99* 2.24*  CALCIUM 8.9 8.9 8.9  MG  --  2.5* 2.5*    Liver Function Tests: No results for input(s): AST, ALT, ALKPHOS, BILITOT, PROT, ALBUMIN in the last 168 hours. No results for input(s): LIPASE, AMYLASE in the last 168 hours. No results for input(s): AMMONIA in the last 168 hours.  CBC: Recent Labs  Lab 08/04/19 0513 08/05/19 0547  WBC 13.7* 12.5*  HGB 13.4 13.3  HCT 38.9 38.6  MCV 90.7 91.7  PLT 228 259    Cardiac Enzymes: No results for input(s): CKTOTAL, TROPONINI in the last 168 hours.  BNP: Invalid input(s): POCBNP  CBG: No results for input(s): GLUCAP in the last 168 hours.  Microbiology: Recent Results (from the past 720 hour(s))  SARS CORONAVIRUS 2 (TAT 6-24 HRS) Nasopharyngeal Nasopharyngeal Swab     Status: None   Collection Time: 08/02/19  3:07 PM   Specimen: Nasopharyngeal Swab  Result Value Ref Range Status   SARS Coronavirus 2 NEGATIVE NEGATIVE Final    Comment: (NOTE) SARS-CoV-2 target nucleic acids are NOT DETECTED. The SARS-CoV-2 RNA is generally detectable in upper and lower respiratory specimens during the acute phase of infection. Negative results do not preclude SARS-CoV-2 infection, do not rule out co-infections with other pathogens, and should not be used as the sole basis for treatment or other patient management decisions. Negative results must be combined with clinical observations, patient history, and epidemiological information. The expected result is Negative. Fact Sheet for Patients: SugarRoll.be Fact Sheet for Healthcare Providers: https://www.woods-mathews.com/ This test is not yet approved or cleared by the [place] FDA and  has been authorized  for detection and/or diagnosis of SARS-CoV-2 by FDA under an Emergency Use Authorization (EUA). This EUA will remain  in effect (meaning this test can be used) for the duration of the COVID-19 declaration under Section 56 4(b)(1) of the Act, 21 U.S.C. section 360bbb-3(b)(1), unless the authorization is terminated or revoked sooner. Performed at [facility],  [address], [place], [place] [address]      Coagulation Studies: No results for input(s): LABPROT, INR in the last 72 hours.  Urinalysis: No results for input(s): COLORURINE, LABSPEC, PHURINE, GLUCOSEU, HGBUR, BILIRUBINUR, KETONESUR, PROTEINUR, UROBILINOGEN, NITRITE, LEUKOCYTESUR in the last 72 hours.  Invalid input(s): APPERANCEUR      Imaging: US RENAL  Result Date: 08/05/2019 CLINICAL DATA:  Acute kidney injury. EXAM: RENAL / URINARY TRACT ULTRASOUND COMPLETE COMPARISON:  04/22/2019 FINDINGS: Right Kidney: Renal measurements: 9.6 x 3.6 x 4.1 cm = volume: 74.4 mL. No hydronephrosis. Echogenicity normal. There is a upper pole renal sinus cyst measuring 2 x 1.9 x 1.7 cm. Left Kidney: Renal measurements: 9.3 x 3.9 x 4.1 cm = volume: 77.0 mL. Large exophytic cyst arising from the lateral cortex of the left mid kidney measures 6.6 x 5.5 x 6.0 cm. No hydronephrosis. Normal echogenicity. Bladder: Appears normal for degree of bladder distention. Other: None. IMPRESSION: 1. No acute findings.  No hydronephrosis. 2. Bilateral kidney cysts. Electronically Signed   By: Kerby Moors M.D.   On: 08/05/2019 11:09      Assessment & Plan: Pt is a 85 y.o. Caucasian  female with coronary artery disease, severe atherosclerosis right renal artery stenosis, SMA stenosis with recent stent placement, COPD, peripheral neuropathy, was admitted on 08/02/2019 with AKI (acute kidney injury) ([place]) [N17.9] COPD with acute exacerbation (H[facility]) [J44.1] Dyspnea, unspecified type [R06.00]  2D echo from March 21: LVEF 50 to 81%, grade 1 diastolic  dysfunction, normal right ventricular systolic function  #Acute kidney injury, chronic kidney disease stage IV, right renal artery stenosis Original baseline of 1.08/GFR 48 from January 25 However, multiple IV contrast exposures - in December 2020 and also in January 2021 for angioplasty and stent placement in SMA artery.  Patient has underlying severe atherosclerotic disease which may be contributing to chronic kidney disease Patient may have established a new baseline Recommend to use careful diuresis.  Use torsemide as needed Hold ARB losartan for now.  We will reevaluate use as outpatient once renal function is stabilized Avoid nonsteroidals and IV contrast         LOS: 3 Calli Bashor 3/23/20211:33 PM    Note: This note was prepared with Dragon dictation. Any transcription errors are unintentional

## 2019-08-05 NOTE — Progress Notes (Signed)
PROGRESS NOTE    Tina [name]  KNL:[id number] DOB: September 07, 1934 DOA: 08/02/2019 PCP: Einar Pheasant, MD    Assessment & Plan:   Principal Problem:   Acute on chronic diastolic CHF (congestive heart failure) ([place]) Active Problems:   CAD (coronary artery disease)   Essential hypertension   AKI (acute kidney injury) ([place])   COPD with acute exacerbation (H[facility])   Abnormality of gait    Tina [name] is a 85 y.o. female with medical history significant for COPD, hypertension, CHF, anemia of chronic disease and GERD  Who presented to the emergency room for evaluation of shortness of breath and worsening lower extremity weakness which she has had for about 3 weeks.  Shortness of breath is mostly with exertion and she has noted she is unable to climb her stairs as she usually would.   Acute hypoxic respiratory failure, resolved Persistent dyspnea on exertion --Patient presents for evaluation of shortness of breath mostly with exertion.  On presentation, needed 1-2L supplemental O2, now on RA even with walking.  Does not qualify for home O2.  Persistent DOE may be part deconditioning, and part progression of COPD. --tx as below   COPD with acute exacerbation --schedule DuoNeb q4h --continue steroid as prednisone 40 mg daily for total of 5 days.  Chronic diastolic dysfunction CHF, does not appear to be in exacerbation --Last 2D echocardiogram showed an LVEF of 60 to 65%.  TTE during this admission showed LVEF 50-55% with grade I diastolic dysfunction.  Pt received IV Lasix 40 mg x2, and Cr already trended up.  Pt did not appear to be volume overloaded, and CXR showed both lungs clear. PLAN: --Hold further diuresis for now, as Cr already trended up and pt does not appear volume overloaded --continue metoprolol, losartan and hydralazine --Maintain low-sodium diet  Acute kidney injury Unclear etiology At baseline patient has a serum creatinine of 1.08 and on admission it was  2.1 PLAN: --hold diuresis for now --Renal US today, no acute findings --Nephrology consult today due to resolving AKI  HTN BP uncontrolled. --increase hydralazine to 100 mg TID, metop to 100 mg BID --continue amlodipine as 10 mg daily --Hold hom Losartan and torsemide 2/2 AKI  History coronary artery disease Continue aspirin, Plavix and statin  Peripheral neuropathy/gait dysfunction Patient has a history of peripheral neuropathy She is on gabapentin She complains of worsening lower extremity weakness which has resulted in gait dysfunction/abnormality --d/c neurology consult ordered on admission --PT evaluation   DVT prophylaxis: Heparin SQ Code Status: DNR  Family Communication: sister updated at bedside Disposition Plan: from home, back home tomorrow, if BP better controlled.  Can follow up with nephrology as outpatient.   Subjective and Interval History:  Dyspnea remained the same.  No fever, chest pain, abdominal pain, N/V/D, dysuria, increased swelling.  BP have been elevated despite increasing BP meds.  AKI not improving.  Nephrology consult today.   Objective: Vitals:   08/05/19 0820 08/05/19 1058 08/05/19 1446 08/05/19 1637  BP:  (!) 171/68  (!) 155/62  Pulse: 78 82 83 75  Resp: 20 19 18 19   Temp:  97.8 F (36.6 C)  98.2 F (36.8 C)  TempSrc:  Oral  Oral  SpO2: 94% 93% 93% 93%  Weight:      Height:        Intake/Output Summary (Last 24 hours) at 08/05/2019 1926 Last data filed at 08/05/2019 1259 Gross per 24 hour  Intake --  Output 500 ml  Net -500  ml   Filed Weights   08/03/19 0335 08/04/19 0427 08/05/19 0450  Weight: 61.3 kg 61.8 kg 62.6 kg    Examination:   Constitutional: NAD, AAOx3 HEENT: conjunctivae and lids normal, EOMI CV: RRR no M,R,G. Distal pulses +2.  No cyanosis.   RESP: CTA B/L, reduced lung sounds, normal respiratory effort, on RA GI: +BS, NTND Extremities: Edema in BLE improved SKIN: warm, dry and intact Neuro: II - XII  grossly intact.  Sensation intact Psych: Normal mood and affect.  Appropriate judgement and reason   Data Reviewed: I have personally reviewed following labs and imaging studies  CBC: Recent Labs  Lab 08/02/19 1210 08/03/19 0540 08/04/19 0513 08/05/19 0547  WBC 13.3* 5.4 13.7* 12.5*  HGB 13.4 13.5 13.4 13.3  HCT 40.0 39.9 38.9 38.6  MCV 92.6 90.9 90.7 91.7  PLT 162 173 228 619   Basic Metabolic Panel: Recent Labs  Lab 08/02/19 1210 08/03/19 0540 08/04/19 0513 08/05/19 0547  NA 135 137 139 137  K 3.9 4.6 4.3 4.3  CL 99 102 106 104  CO2 22 26 24 22   GLUCOSE 135* 171* 141* 144*  BUN 33* 44* 61* 85*  CREATININE 2.10* 2.13* 1.99* 2.24*  CALCIUM 9.1 8.9 8.9 8.9  MG  --   --  2.5* 2.5*   GFR: Estimated Creatinine Clearance: 17.4 mL/min (A) (by C-G formula based on SCr of 2.24 mg/dL (H)). Liver Function Tests: No results for input(s): AST, ALT, ALKPHOS, BILITOT, PROT, ALBUMIN in the last 168 hours. No results for input(s): LIPASE, AMYLASE in the last 168 hours. No results for input(s): AMMONIA in the last 168 hours. Coagulation Profile: No results for input(s): INR, PROTIME in the last 168 hours. Cardiac Enzymes: No results for input(s): CKTOTAL, CKMB, CKMBINDEX, TROPONINI in the last 168 hours. BNP (last 3 results) No results for input(s): PROBNP in the last 8760 hours. HbA1C: No results for input(s): HGBA1C in the last 72 hours. CBG: No results for input(s): GLUCAP in the last 168 hours. Lipid Profile: No results for input(s): CHOL, HDL, LDLCALC, TRIG, CHOLHDL, LDLDIRECT in the last 72 hours. Thyroid Function Tests: No results for input(s): TSH, T4TOTAL, FREET4, T3FREE, THYROIDAB in the last 72 hours. Anemia Panel: No results for input(s): VITAMINB12, FOLATE, FERRITIN, TIBC, IRON, RETICCTPCT in the last 72 hours. Sepsis Labs: No results for input(s): PROCALCITON, LATICACIDVEN in the last 168 hours.  Recent Results (from the past 240 hour(s))  SARS CORONAVIRUS 2  (TAT 6-24 HRS) Nasopharyngeal Nasopharyngeal Swab     Status: None   Collection Time: 08/02/19  3:07 PM   Specimen: Nasopharyngeal Swab  Result Value Ref Range Status   SARS Coronavirus 2 NEGATIVE NEGATIVE Final    Comment: (NOTE) SARS-CoV-2 target nucleic acids are NOT DETECTED. The SARS-CoV-2 RNA is generally detectable in upper and lower respiratory specimens during the acute phase of infection. Negative results do not preclude SARS-CoV-2 infection, do not rule out co-infections with other pathogens, and should not be used as the sole basis for treatment or other patient management decisions. Negative results must be combined with clinical observations, patient history, and epidemiological information. The expected result is Negative. Fact Sheet for Patients: SugarRoll.be Fact Sheet for Healthcare Providers: https://www.woods-mathews.com/ This test is not yet approved or cleared by the [place] FDA and  has been authorized for detection and/or diagnosis of SARS-CoV-2 by FDA under an Emergency Use Authorization (EUA). This EUA will remain  in effect (meaning this test can be used) for the duration  of the COVID-19 declaration under Section 56 4(b)(1) of the Act, 21 U.S.C. section 360bbb-3(b)(1), unless the authorization is terminated or revoked sooner. Performed at [facility], [place] [address], [place], [place] [address]       Radiology Studies: US RENAL  Result Date: 08/05/2019 CLINICAL DATA:  Acute kidney injury. EXAM: RENAL / URINARY TRACT ULTRASOUND COMPLETE COMPARISON:  04/22/2019 FINDINGS: Right Kidney: Renal measurements: 9.6 x 3.6 x 4.1 cm = volume: 74.4 mL. No hydronephrosis. Echogenicity normal. There is a upper pole renal sinus cyst measuring 2 x 1.9 x 1.7 cm. Left Kidney: Renal measurements: 9.3 x 3.9 x 4.1 cm = volume: 77.0 mL. Large exophytic cyst arising from the lateral cortex of the left mid kidney measures 6.6 x  5.5 x 6.0 cm. No hydronephrosis. Normal echogenicity. Bladder: Appears normal for degree of bladder distention. Other: None. IMPRESSION: 1. No acute findings.  No hydronephrosis. 2. Bilateral kidney cysts. Electronically Signed   By: Kerby Moors M.D.   On: 08/05/2019 11:09     Scheduled Meds: . amLODipine  10 mg Oral Daily  . aspirin EC  81 mg Oral Daily  . cholecalciferol  1,000 Units Oral Daily  . clopidogrel  75 mg Oral Daily  . fluticasone  2 spray Each Nare Daily  . gabapentin  200 mg Oral QHS  . heparin  5,000 Units Subcutaneous Q8H  . hydrALAZINE  100 mg Oral TID  . ipratropium-albuterol  3 mL Nebulization Q6H  . metoprolol tartrate  100 mg Oral BID  . mometasone-formoterol  2 puff Inhalation BID  . pravastatin  40 mg Oral q1800  . predniSONE  40 mg Oral Q breakfast  . sodium chloride flush  3 mL Intravenous Once  . cyanocobalamin  2,000 mcg Oral Daily   Continuous Infusions:   LOS: 3 days     Enzo Bi, MD [facility] If 7PM-7AM, please contact night-coverage 08/05/2019, 7:26 PM

## 2019-08-05 NOTE — Progress Notes (Signed)
PT Cancellation Note  Patient Details Name: Tina [name] MRN: [id number] DOB: May 17, 1934   Cancelled Treatment:    Reason Eval/Treat Not Completed: Other (comment). Currently out of room, unavailable for therapy. This Probation officer observed pt ambulating in hallway safely with Rn staff. Doubt will need PT services. Due to surge in new consults, will re-attempt next date if available.   Shaquill Iseman 08/05/2019, 10:51 AM Greggory Stallion, PT, DPT [phone]

## 2019-08-05 NOTE — Care Management Important Message (Signed)
Important Message  Patient Details  Name: Tina [name] MRN: [id number] Date of Birth: June 18, 1934   Medicare Important Message Given:  Yes     Dannette Barbara 08/05/2019, 11:11 AM

## 2019-08-06 ENCOUNTER — Telehealth: Payer: Self-pay | Admitting: Internal Medicine

## 2019-08-06 DIAGNOSIS — J441 Chronic obstructive pulmonary disease with (acute) exacerbation: Secondary | ICD-10-CM

## 2019-08-06 DIAGNOSIS — I10 Essential (primary) hypertension: Secondary | ICD-10-CM

## 2019-08-06 LAB — BASIC METABOLIC PANEL
Anion gap: 9 (ref 5–15)
BUN: 80 mg/dL — ABNORMAL HIGH (ref 8–23)
CO2: 22 mmol/L (ref 22–32)
Calcium: 9.2 mg/dL (ref 8.9–10.3)
Chloride: 107 mmol/L (ref 98–111)
Creatinine, Ser: 1.56 mg/dL — ABNORMAL HIGH (ref 0.44–1.00)
GFR calc Af Amer: 35 mL/min — ABNORMAL LOW (ref >60)
GFR calc non Af Amer: 31 mL/min — ABNORMAL LOW (ref >60)
Glucose, Bld: 137 mg/dL — ABNORMAL HIGH (ref 70–99)
Potassium: 4.6 mmol/L (ref 3.5–5.1)
Sodium: 138 mmol/L (ref 135–145)

## 2019-08-06 LAB — URINALYSIS, COMPLETE (UACMP) WITH MICROSCOPIC
Bacteria, UA: NONE SEEN
Bilirubin Urine: NEGATIVE
Glucose, UA: NEGATIVE mg/dL
Hgb urine dipstick: NEGATIVE
Ketones, ur: NEGATIVE mg/dL
Leukocytes,Ua: NEGATIVE
Nitrite: NEGATIVE
Protein, ur: NEGATIVE mg/dL
Specific Gravity, Urine: 1.016 (ref 1.005–1.030)
Squamous Epithelial / HPF: NONE SEEN (ref 0–5)
pH: 5 (ref 5.0–8.0)

## 2019-08-06 LAB — CBC
HCT: 40.2 % (ref 36.0–46.0)
Hemoglobin: 13.5 g/dL (ref 12.0–15.0)
MCH: 30.8 pg (ref 26.0–34.0)
MCHC: 33.6 g/dL (ref 30.0–36.0)
MCV: 91.8 fL (ref 80.0–100.0)
Platelets: 301 10*3/uL (ref 150–400)
RBC: 4.38 MIL/uL (ref 3.87–5.11)
RDW: 13.4 % (ref 11.5–15.5)
WBC: 10.2 10*3/uL (ref 4.0–10.5)
nRBC: 0 % (ref 0.0–0.2)

## 2019-08-06 LAB — MAGNESIUM: Magnesium: 2.7 mg/dL — ABNORMAL HIGH (ref 1.7–2.4)

## 2019-08-06 MED ORDER — METOPROLOL TARTRATE 100 MG PO TABS: 100.0000 mg | ORAL_TABLET | Freq: Two times a day (BID) | ORAL | 1 refills | Status: DC

## 2019-08-06 MED ORDER — HYDRALAZINE HCL 100 MG PO TABS: 100.0000 mg | ORAL_TABLET | Freq: Three times a day (TID) | ORAL | 0 refills | Status: DC

## 2019-08-06 MED ORDER — PREDNISONE 20 MG PO TABS: 40.0000 mg | ORAL_TABLET | Freq: Every day | ORAL | 0 refills | Status: DC

## 2019-08-06 MED ORDER — HYDRALAZINE HCL 50 MG PO TABS: 50.0000 mg | ORAL_TABLET | Freq: Three times a day (TID) | ORAL | 1 refills | Status: DC

## 2019-08-06 MED ORDER — TORSEMIDE 20 MG PO TABS: 20.0000 mg | ORAL_TABLET | Freq: Every day | ORAL | 0 refills | Status: AC | PRN

## 2019-08-06 NOTE — Evaluation (Signed)
Physical Therapy Evaluation Patient Details Name: Tina [name] MRN: [id number] DOB: 09/28/1934 Today's Date: 08/06/2019   History of Present Illness  Pt admitted for acute on chronic diastolic CHF with complaints of SOB and weakness x 2-3 weeks. History includes COPD, HTN, CHF, GERD, bladder ca, and neuropathy on B LE.  Clinical Impression  Pt is a pleasant 85 year old female who was admitted for acute on chronic diastolic CHF with complaints of SOB and weakness x 2-3 weeks. Pt performs bed mobility with independence, transfers with mod I, and ambulation with supervision. Pt demonstrates deficits with strength in R LE, however not affecting functional mobility at this time. 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 No further physical therapy needs in acute setting. This patient is safe to continue ambulation attempts with nursing staff. Pre gait vitals: 93% on RA and 74bpm and post exertion: 92% on RA with 80bpm. Will dc current orders.     Follow Up Recommendations Outpatient PT(wants to do therapy at the [facility] OP rehab)    Equipment Recommendations  None recommended by PT    Recommendations for Other Services       Precautions / Restrictions Precautions Precautions: Fall Restrictions Weight Bearing Restrictions: No      Mobility  Bed Mobility Overal bed mobility: Independent             General bed mobility comments: safe technique  Transfers Overall transfer level: Modified independent Equipment used: Rolling walker (2 wheeled)             General transfer comment: safe technique with upright posture. RW used  Ambulation/Gait Ambulation/Gait assistance: Supervision Social research officer, government (Feet): 800 Feet Assistive device: Rolling walker (2 wheeled) Gait Pattern/deviations: Step-through pattern     General Gait Details: ambulated around nurses station multiple times, able to carry conversation during mobility attempts without SOB symptoms.  All mobility performed on RA. No LOB noted.  Stairs            Wheelchair Mobility    Modified Rankin (Stroke Patients Only)       Balance Overall balance assessment: No apparent balance deficits (not formally assessed)(1 fall over a year ago)                                           Pertinent Vitals/Pain Pain Assessment: No/denies pain    Home Living Family/patient expects to be discharged to:: Private residence Living Arrangements: Children(son) Available Help at Discharge: Family;Available 24 hours/day Type of Home: House Home Access: Stairs to enter Entrance Stairs-Rails: None Entrance Stairs-Number of Steps: 1 Home Layout: Two level Home Equipment: Walker - 4 wheels      Prior Function Level of Independence: Independent with assistive device(s)         Comments: has been using rollater since last July. Reports she gets fatigued if she doesn't use RW     Hand Dominance        Extremity/Trunk Assessment   Upper Extremity Assessment Upper Extremity Assessment: Overall WFL for tasks assessed    Lower Extremity Assessment Lower Extremity Assessment: Generalized weakness(R LE grossly 4/5; L LE grossly 5/5)       Communication   Communication: No difficulties  Cognition Arousal/Alertness: Awake/alert Behavior During Therapy: WFL for tasks assessed/performed Overall Cognitive Status: Within Functional Limits for tasks assessed  General Comments      Exercises     Assessment/Plan    PT Assessment All further PT needs can be met in the next venue of care  PT Problem List Decreased strength;Decreased activity tolerance;Cardiopulmonary status limiting activity       PT Treatment Interventions      PT Goals (Current goals can be found in the Care Plan section)  Acute Rehab PT Goals Patient Stated Goal: to go home PT Goal Formulation: All assessment and education  complete, DC therapy Time For Goal Achievement: 08/06/19 Potential to Achieve Goals: Good    Frequency     Barriers to discharge        Co-evaluation               AM-PAC PT "6 Clicks" Mobility  Outcome Measure Help needed turning from your back to your side while in a flat bed without using bedrails?: None Help needed moving from lying on your back to sitting on the side of a flat bed without using bedrails?: None Help needed moving to and from a bed to a chair (including a wheelchair)?: None Help needed standing up from a chair using your arms (e.g., wheelchair or bedside chair)?: None Help needed to walk in hospital room?: None Help needed climbing 3-5 steps with a railing? : None 6 Click Score: 24    End of Session Equipment Utilized During Treatment: Gait belt Activity Tolerance: Patient tolerated treatment well Patient left: in chair;with chair alarm set Nurse Communication: Mobility status PT Visit Diagnosis: Muscle weakness (generalized) (M62.81)    Time: 1505-6979 PT Time Calculation (min) (ACUTE ONLY): 26 min   Charges:   PT Evaluation $PT Eval Low Complexity: 1 Low PT Treatments $Gait Training: 8-22 mins       Greggory Stallion, PT, DPT [phone]   Mikaiya Tramble 08/06/2019, 10:40 AM

## 2019-08-06 NOTE — Discharge Summary (Addendum)
Physician Discharge Summary  Tina [name] QMV:[id number] DOB: 1934/08/07 DOA: 08/02/2019  PCP: Einar Pheasant, MD  Admit date: 08/02/2019 Discharge date: 08/06/2019  Admitted From: home Disposition:  home  Recommendations for Outpatient Follow-up:  1. Follow up with PCP in 1-2 weeks 2. Please obtain BMP/CBC in one week 3. Please follow up with nephrology in 2 weeks 4. Please follow up on blood pressure.  Losartan held on discharge due to worsened renal function.  Increased hydralazine and metoprolol.  Home Health: No  Equipment/Devices: Non4   Discharge Condition: Stable  CODE STATUS: DNR Diet recommendation: Heart Healthy  Brief/Interim Summary:  Tina [name] Viersis a 85 y.o.femalewith medical history significant forCOPD,hypertension,CHF,anemia of chronic disease and GERD Whopresented to the ED for evaluation of shortness of breathandworsening lower extremity weaknesswhich she has had for about 3 weeks.Shortness of breath is mostly with exertion andshe has noted she is unable to climb her stairs asshe usually would.     Acute hypoxic respiratory failure, resolved, due to COPD  Persistent dyspnea on exertion - seems to be mostly due to her COPD. --weaned off oxygen, was requiring 1-2 L/min oxygen   COPD with acute exacerbation --schedule DuoNeb q4h --complete prednisone 40 mg daily x 2 more days  Chronic diastolic dysfunction CHF, does not appear to be in exacerbation --Last 2D echocardiogram showed an LVEF of 60 to 65%.  TTE during this admission showed LVEF 50-55% with grade I diastolic dysfunction.  Pt received IV Lasix 40 mg x2, and Cr already trended up. Clinically patient appears euvolemic. --nephrology consulted, recommends torsemide use only PRN (from daily) --hold losartan until follow up --increased metoprolol and hydralazine for better BP control with ARB held. --low sodium diet  Acute kidney injury - vs progression of CKD.   At baseline patient has a  serum creatinine of1.08 and on admission it was 2.1.  Renal ultrasound unremarkable.  Nephrology consulted. --nephrology follow up in 2 weeks.  Hypertension - uncontrolled --increase hydralazine to 100 mg TID, metop to 100 mg BID --continue amlodipine as 10 mg daily --Hold hom Losartan and torsemide 2/2 AKI --monitor BP at home  Historycoronary artery disease Continue aspirin, Plavixand statin  Peripheral neuropathy/gait dysfunction --continue gabapentin --PT eval, recommended outpatient PT follow up   Discharge Diagnoses: Principal Problem:   Acute on chronic diastolic CHF (congestive heart failure) ([place]) Active Problems:   CAD (coronary artery disease)   Essential hypertension   AKI (acute kidney injury) ([place])   COPD with acute exacerbation (HCC)   Abnormality of gait    Discharge Instructions   Discharge Instructions    (HEART FAILURE PATIENTS) Call MD:  Anytime you have any of the following symptoms: 1) 3 pound weight gain in 24 hours or 5 pounds in 1 week 2) shortness of breath, with or without a dry hacking cough 3) swelling in the hands, feet or stomach 4) if you have to sleep on extra pillows at night in order to breathe.   Complete by: As directed    Call MD for:  extreme fatigue   Complete by: As directed    Call MD for:  persistant dizziness or light-headedness   Complete by: As directed    Diet - low sodium heart healthy   Complete by: As directed    Discharge instructions   Complete by: As directed    Please take all medications as prescribed.   Torsemide - you should stop taking every day, take it daily as needed for increased swelling or worsening  shortness of breath.   Stop taking losartan for now, until nephrology tells you to take it again. See nephrology in 2 weeks. Continue prednisone for another 2 days for COPD.   Increase activity slowly   Complete by: As directed      Allergies as of 08/06/2019      Reactions   Evista [raloxifene] Other  (See Comments)   Night sweats    Fluticasone-salmeterol Other (See Comments)   Cough, "chokes me"       Medication List    STOP taking these medications   losartan 100 MG tablet Commonly known as: COZAAR   potassium chloride 10 MEQ tablet Commonly known as: KLOR-CON     TAKE these medications   albuterol 108 (90 Base) MCG/ACT inhaler Commonly known as: VENTOLIN HFA Inhale 2 puffs into the lungs every 6 (six) hours as needed for wheezing or shortness of breath.   amLODipine 5 MG tablet Commonly known as: NORVASC TAKE 1 TABLET BY MOUTH  TWICE DAILY   aspirin EC 81 MG tablet Take 1 tablet (81 mg total) by mouth daily.   cholecalciferol 1000 units tablet Commonly known as: VITAMIN D Take 1,000 Units by mouth daily.   clopidogrel 75 MG tablet Commonly known as: PLAVIX Take 1 tablet (75 mg total) by mouth daily.   cyanocobalamin 1000 MCG tablet Take 2,000 mcg by mouth daily.   fluticasone 50 MCG/ACT nasal spray Commonly known as: FLONASE USE 2 SPRAYS INTO BOTH  NOSTRILS DAILY AS NEEDED  FOR ALLERGIES. What changed: See the new instructions.   gabapentin 100 MG capsule Commonly known as: NEURONTIN Take 2 capsules by mouth at bedtime.   hydrALAZINE 100 MG tablet Commonly known as: APRESOLINE Take 1 tablet (100 mg total) by mouth 3 (three) times daily. What changed:   medication strength  how much to take   ipratropium-albuterol 0.5-2.5 (3) MG/3ML Soln Commonly known as: DUONEB Take 3 mLs by nebulization every 6 (six) hours as needed (for shortness of breath).   lovastatin 40 MG tablet Commonly known as: MEVACOR TAKE 1 TABLET BY MOUTH  DAILY IN THE EVENING   metoprolol tartrate 100 MG tablet Commonly known as: LOPRESSOR Take 1 tablet (100 mg total) by mouth 2 (two) times daily. What changed:   medication strength  how much to take   predniSONE 20 MG tablet Commonly known as: DELTASONE Take 2 tablets (40 mg total) by mouth daily with breakfast. Start  taking on: August 07, 2019   Symbicort 160-4.5 MCG/ACT inhaler Generic drug: budesonide-formoterol USE 2 PUFFS BY MOUTH TWO  TIMES DAILY   torsemide 20 MG tablet Commonly known as: DEMADEX Take 1 tablet (20 mg total) by mouth daily as needed (swelling or shortness of breath). What changed:   when to take this  reasons to take this      [place] Follow up on 08/13/2019.   Specialty: Cardiology Why: at 1:00pm. Enter through the [place] entrance Contact information: [place] [phone]       Murlean Iba, MD. Daphane Shepherd on 09/02/2019.   Specialty: Nephrology Why: appointment at [facility] information: [place] [address] [phone]        Einar Pheasant, MD. Go on 08/19/2019.   Specialty: Internal Medicine Why: Appointment at [facility] information: [address] [place] [address] [phone]  [facility]. Schedule an appointment as soon as possible for a visit in 1 week.   Specialty: Rehabilitation Contact information: [place] [id number] [address] [place] [address] [phone]         Allergies  Allergen Reactions  . Evista [Raloxifene] Other (See Comments)    Night sweats   . Fluticasone-Salmeterol Other (See Comments)    Cough, "chokes me"     Consultations:  Nephrology    Procedures/Studies: DG Chest 2 View  Result Date: 08/02/2019 CLINICAL DATA:  increasing shortness of breath and weakness over last 2-3 weeks. PT states she in unable to climb her stairs at home as normal. States she has noted a drop in exertional O2 sats to low 90s. EXAM: CHEST - 2 VIEW COMPARISON:  Chest radiograph 10/20/2017 FINDINGS: The heart size and mediastinal contours are within normal limits. Aortic arch  calcification. Lungs are hyperinflated chronic bilateral bronchial wall thickening. There is a small left pleural effusion and mild bibasilar opacities which could reflect scarring or atelectasis. No pneumothorax. No acute finding in the visualized skeleton. Both lungs are clear. The visualized skeletal structures are unremarkable. IMPRESSION: Small left pleural effusion and mild bibasilar opacities favored to represent scarring or atelectasis. COPD and chronic bronchitic change. Electronically Signed   By: Audie Pinto M.D.   On: 08/02/2019 13:53   US RENAL  Result Date: 08/05/2019 CLINICAL DATA:  Acute kidney injury. EXAM: RENAL / URINARY TRACT ULTRASOUND COMPLETE COMPARISON:  04/22/2019 FINDINGS: Right Kidney: Renal measurements: 9.6 x 3.6 x 4.1 cm = volume: 74.4 mL. No hydronephrosis. Echogenicity normal. There is a upper pole renal sinus cyst measuring 2 x 1.9 x 1.7 cm. Left Kidney: Renal measurements: 9.3 x 3.9 x 4.1 cm = volume: 77.0 mL. Large exophytic cyst arising from the lateral cortex of the left mid kidney measures 6.6 x 5.5 x 6.0 cm. No hydronephrosis. Normal echogenicity. Bladder: Appears normal for degree of bladder distention. Other: None. IMPRESSION: 1. No acute findings.  No hydronephrosis. 2. Bilateral kidney cysts. Electronically Signed   By: Kerby Moors M.D.   On: 08/05/2019 11:09   ECHOCARDIOGRAM COMPLETE  Result Date: 08/03/2019    ECHOCARDIOGRAM REPORT   Patient Name:   Tina [name] Date of Exam: 08/03/2019 Medical Rec #:  [id number]     Height:       65.0 in Accession #:    [id number]    Weight:       135.1 lb Date of Birth:  July 25, 1934     BSA:          1.674 m Patient Age:    61 years      BP:           183/76 mmHg Patient Gender: F             HR:           81 bpm. Exam Location:  [facility] Procedure: 2D Echo Indications:     CHF 428.31/ I50.31  History:         Patient has prior history of Echocardiogram examinations, most                  recent 10/20/2017.  Sonographer:      Arville Go RDCS Referring Phys:  [id number] AGBATA Diagnosing Phys: Yolonda Kida MD IMPRESSIONS  1. Left ventricular ejection fraction, by estimation, is 50 to 55%. The left ventricle has low normal function. The left ventricle demonstrates  global hypokinesis. Left ventricular diastolic parameters are consistent with Grade I diastolic dysfunction (impaired relaxation).  2. Right ventricular systolic function is normal. The right ventricular size is normal.  3. The mitral valve is grossly normal. Trivial mitral valve regurgitation.  4. The aortic valve is grossly normal. Aortic valve regurgitation is not visualized. FINDINGS  Left Ventricle: Left ventricular ejection fraction, by estimation, is 50 to 55%. The left ventricle has low normal function. The left ventricle demonstrates global hypokinesis. The left ventricular internal cavity size was normal in size. There is no left ventricular hypertrophy. Left ventricular diastolic parameters are consistent with Grade I diastolic dysfunction (impaired relaxation). Right Ventricle: The right ventricular size is normal. No increase in right ventricular wall thickness. Right ventricular systolic function is normal. Left Atrium: Left atrial size was normal in size. Right Atrium: Right atrial size was normal in size. Pericardium: There is no evidence of pericardial effusion. Mitral Valve: The mitral valve is grossly normal. Trivial mitral valve regurgitation. Tricuspid Valve: The tricuspid valve is normal in structure. Tricuspid valve regurgitation is mild. Aortic Valve: The aortic valve is grossly normal. Aortic valve regurgitation is not visualized. Aortic valve peak gradient measures 7.0 mmHg. Pulmonic Valve: The pulmonic valve was normal in structure. Pulmonic valve regurgitation is mild. Aorta: The aortic root is normal in size and structure. IAS/Shunts: The atrial septum is grossly normal.  LEFT VENTRICLE PLAX 2D LVIDd:         4.96 cm     Diastology  LVIDs:         3.99 cm     LV e' lateral:   5.87 cm/s LV PW:         1.03 cm     LV E/e' lateral: 15.3 LV IVS:        0.91 cm     LV e' medial:    5.87 cm/s LVOT diam:     1.70 cm     LV E/e' medial:  15.3 LV SV:         49 LV SV Index:   29 LVOT Area:     2.27 cm  LV Volumes (MOD) LV vol d, MOD A4C: 44.2 ml LV vol s, MOD A4C: 19.3 ml LV SV MOD A4C:     44.2 ml RIGHT VENTRICLE RV Basal diam:  1.90 cm RV S prime:     14.90 cm/s TAPSE (M-mode): 2.5 cm LEFT ATRIUM             Index       RIGHT ATRIUM           Index LA diam:        3.70 cm 2.21 cm/m  RA Area:     11.10 cm LA Vol (A2C):   31.0 ml 18.51 ml/m RA Volume:   22.20 ml  13.26 ml/m LA Vol (A4C):   14.9 ml 8.90 ml/m LA Biplane Vol: 22.9 ml 13.68 ml/m  AORTIC VALVE                PULMONIC VALVE AV Area (Vmax): 1.51 cm    PV Vmax:       1.06 m/s AV Vmax:        132.00 cm/s PV Peak grad:  4.5 mmHg AV Peak Grad:   7.0 mmHg LVOT Vmax:      87.90 cm/s LVOT Vmean:     52.800 cm/s LVOT VTI:       0.215 m  AORTA Ao Root diam: 2.60 cm  MITRAL VALVE               TRICUSPID VALVE MV Area (PHT): 3.27 cm    TV Peak grad:   29.2 mmHg MV Decel Time: 232 msec    TV Vmax:        2.70 m/s MV E velocity: 89.70 cm/s MV A velocity: 96.00 cm/s  SHUNTS MV E/A ratio:  0.93        Systemic VTI:  0.22 m                            Systemic Diam: 1.70 cm Dwayne Prince Rome MD Electronically signed by Yolonda Kida MD Signature Date/Time: 08/03/2019/3:38:16 PM    Final       Echo    Subjective: Patient seen with daughter at bedside today.  Reports she is still SOB with activity but feels at her baseline.  Denies worsening swelling or SOB.  No CP, fever/chills or other complaints.   Discharge Exam: Vitals:   08/06/19 1152 08/06/19 1316  BP: (!) 176/110 (!) 148/64  Pulse: 66 65  Resp: 16   Temp: 98.3 F (36.8 C)   SpO2: 94% 95%   Vitals:   08/06/19 0752 08/06/19 0803 08/06/19 1152 08/06/19 1316  BP:  (!) 174/72 (!) 176/110 (!) 148/64  Pulse: (!) 58 64 66 65   Resp: (!) 22 16 16    Temp:  97.7 F (36.5 C) 98.3 F (36.8 C)   TempSrc:  Oral Oral   SpO2: 96% 95% 94% 95%  Weight:      Height:        General: Pt is alert, awake, not in acute distress Cardiovascular: RRR, S1/S2 +, no rubs, no gallops Respiratory: CTA bilaterally, no wheezing, no rhonchi Abdominal: Soft, NT, ND, bowel sounds + Extremities: mild lower extremity nonpitting edema, no cyanosis    The results of significant diagnostics from this hospitalization (including imaging, microbiology, ancillary and laboratory) are listed below for reference.     Microbiology: Recent Results (from the past 240 hour(s))  SARS CORONAVIRUS 2 (TAT 6-24 HRS) Nasopharyngeal Nasopharyngeal Swab     Status: None   Collection Time: 08/02/19  3:07 PM   Specimen: Nasopharyngeal Swab  Result Value Ref Range Status   SARS Coronavirus 2 NEGATIVE NEGATIVE Final    Comment: (NOTE) SARS-CoV-2 target nucleic acids are NOT DETECTED. The SARS-CoV-2 RNA is generally detectable in upper and lower respiratory specimens during the acute phase of infection. Negative results do not preclude SARS-CoV-2 infection, do not rule out co-infections with other pathogens, and should not be used as the sole basis for treatment or other patient management decisions. Negative results must be combined with clinical observations, patient history, and epidemiological information. The expected result is Negative. Fact Sheet for Patients: SugarRoll.be Fact Sheet for Healthcare Providers: https://www.woods-mathews.com/ This test is not yet approved or cleared by the [place] FDA and  has been authorized for detection and/or diagnosis of SARS-CoV-2 by FDA under an Emergency Use Authorization (EUA). This EUA will remain  in effect (meaning this test can be used) for the duration of the COVID-19 declaration under Section 56 4(b)(1) of the Act, 21 U.S.C. section 360bbb-3(b)(1),  unless the authorization is terminated or revoked sooner. Performed at [facility], [place] [address], [place], [place] [address]      Labs: BNP (last 3 results) Recent Labs    08/02/19 1352  BNP 443.0*  Basic Metabolic Panel: Recent Labs  Lab 08/02/19 1210 08/03/19 0540 08/04/19 0513 08/05/19 0547 08/06/19 0609  NA 135 137 139 137 138  K 3.9 4.6 4.3 4.3 4.6  CL 99 102 106 104 107  CO2 22 26 24 22 22   GLUCOSE 135* 171* 141* 144* 137*  BUN 33* 44* 61* 85* 80*  CREATININE 2.10* 2.13* 1.99* 2.24* 1.56*  CALCIUM 9.1 8.9 8.9 8.9 9.2  MG  --   --  2.5* 2.5* 2.7*   Liver Function Tests: No results for input(s): AST, ALT, ALKPHOS, BILITOT, PROT, ALBUMIN in the last 168 hours. No results for input(s): LIPASE, AMYLASE in the last 168 hours. No results for input(s): AMMONIA in the last 168 hours. CBC: Recent Labs  Lab 08/02/19 1210 08/03/19 0540 08/04/19 0513 08/05/19 0547 08/06/19 0609  WBC 13.3* 5.4 13.7* 12.5* 10.2  HGB 13.4 13.5 13.4 13.3 13.5  HCT 40.0 39.9 38.9 38.6 40.2  MCV 92.6 90.9 90.7 91.7 91.8  PLT 162 173 228 259 301   Cardiac Enzymes: No results for input(s): CKTOTAL, CKMB, CKMBINDEX, TROPONINI in the last 168 hours. BNP: Invalid input(s): POCBNP CBG: No results for input(s): GLUCAP in the last 168 hours. D-Dimer No results for input(s): DDIMER in the last 72 hours. Hgb A1c No results for input(s): HGBA1C in the last 72 hours. Lipid Profile No results for input(s): CHOL, HDL, LDLCALC, TRIG, CHOLHDL, LDLDIRECT in the last 72 hours. Thyroid function studies No results for input(s): TSH, T4TOTAL, T3FREE, THYROIDAB in the last 72 hours.  Invalid input(s): FREET3 Anemia work up No results for input(s): VITAMINB12, FOLATE, FERRITIN, TIBC, IRON, RETICCTPCT in the last 72 hours. Urinalysis    Component Value Date/Time   COLORURINE YELLOW (A) 08/05/2019 1257   APPEARANCEUR CLEAR (A) 08/05/2019 1257   APPEARANCEUR Clear 09/13/2017 1502    LABSPEC 1.016 08/05/2019 1257   PHURINE 5.0 08/05/2019 1257   GLUCOSEU NEGATIVE 08/05/2019 1257   GLUCOSEU NEGATIVE 10/09/2017 1121   HGBUR NEGATIVE 08/05/2019 1257   BILIRUBINUR NEGATIVE 08/05/2019 1257   BILIRUBINUR Negative 09/13/2017 1502   KETONESUR NEGATIVE 08/05/2019 1257   PROTEINUR NEGATIVE 08/05/2019 1257   UROBILINOGEN 0.2 10/09/2017 1121   NITRITE NEGATIVE 08/05/2019 1257   LEUKOCYTESUR NEGATIVE 08/05/2019 1257   Sepsis Labs Invalid input(s): PROCALCITONIN,  WBC,  LACTICIDVEN Microbiology Recent Results (from the past 240 hour(s))  SARS CORONAVIRUS 2 (TAT 6-24 HRS) Nasopharyngeal Nasopharyngeal Swab     Status: None   Collection Time: 08/02/19  3:07 PM   Specimen: Nasopharyngeal Swab  Result Value Ref Range Status   SARS Coronavirus 2 NEGATIVE NEGATIVE Final    Comment: (NOTE) SARS-CoV-2 target nucleic acids are NOT DETECTED. The SARS-CoV-2 RNA is generally detectable in upper and lower respiratory specimens during the acute phase of infection. Negative results do not preclude SARS-CoV-2 infection, do not rule out co-infections with other pathogens, and should not be used as the sole basis for treatment or other patient management decisions. Negative results must be combined with clinical observations, patient history, and epidemiological information. The expected result is Negative. Fact Sheet for Patients: SugarRoll.be Fact Sheet for Healthcare Providers: https://www.woods-mathews.com/ This test is not yet approved or cleared by the [place] FDA and  has been authorized for detection and/or diagnosis of SARS-CoV-2 by FDA under an Emergency Use Authorization (EUA). This EUA will remain  in effect (meaning this test can be used) for the duration of the COVID-19 declaration under Section 56 4(b)(1) of the Act, 21 U.S.C. section 360bbb-3(b)(1),  unless the authorization is terminated or revoked sooner. Performed at [facility], [place] [address], [place], [place] [address]      Time coordinating discharge: Over 30 minutes  SIGNED:   Ezekiel Slocumb, DO [facility] 08/06/2019, 1:58 PM   If 7PM-7AM, please contact night-coverage [email or web address]

## 2019-08-06 NOTE — Telephone Encounter (Signed)
Hospital follow up scheduled for patient 08/19/19 @ 11am.

## 2019-08-06 NOTE — Plan of Care (Signed)
  Problem: Education: Goal: Knowledge of General Education information will improve Description: Including pain rating scale, medication(s)/side effects and non-pharmacologic comfort measures Outcome: Adequate for Discharge   

## 2019-08-07 DIAGNOSIS — Z961 Presence of intraocular lens: Secondary | ICD-10-CM | POA: Diagnosis not present

## 2019-08-07 LAB — UREA NITROGEN, URINE: Urea Nitrogen, Ur: 941 mg/dL

## 2019-08-08 ENCOUNTER — Telehealth: Payer: Self-pay | Admitting: Family

## 2019-08-08 NOTE — Telephone Encounter (Signed)
Called to follow up with patient regarding follow up chf clinic appointment we made after her recent hospital discharge but was unable to reach. Asked patient to call back to confirm appointment.   Alyse Low, [place]

## 2019-08-08 NOTE — Telephone Encounter (Signed)
Transition Care Management Follow-up Telephone Call  Date of discharge and from where: 08/06/19 from [facility]  How have you been since you were released from the hospital? Difficulty standing this morning and slid down to the floor. No injury. Legs were weak upon standing but feels stronger now. Encouraged to use walker.  Shortness of breath is better. No home oxygen. Room oxygen currently 97. Today noticed swelling in both legs. Wearing compression socks. Took 1 torsemide today and swelling is going down. Will continue to monitor. Today BP 171/77 HR 77. Denies headaches, dizziness, diarrhea, nausea, vomiting, weight gain and all other symptoms.  Any questions or concerns? Concerned about holding potassium.   Items Reviewed:  Did the pt receive and understand the discharge instructions provided? Yes, increase activity as tolerated. Monitor weight daily.   Medications obtained and verified? Yes, holding potassium and losartan starting tomorrow. Taking torsemide as needed. Starting prednisone today for 2 days. Taking all other scheduled medications as directed.  Any new allergies since your discharge? No  Dietary orders reviewed? Low sodium, heart healthy  Do you have support at home? Yes, daughter and son assist.  Functional Questionnaire: (I = Independent and D = Dependent) ADLs: I  Bathing/Dressing- I  Meal Prep- I  Eating- I  Maintaining continence- I with BSC  Transferring/Ambulation- Walker. Encouraged to keep home phone/cell phone with her while ambulating.   Managing Meds- I  Follow up appointments reviewed:   PCP Hospital f/u appt confirmed?  Scheduled to see Dr. Nicki Reaper on 08/19/19 @ 11:00. Patient is flexible to be seen sooner if appointment is available.   Cardiology 3/31 and Nephrology 4/20) f/u appt confirmed?  Yes.  Are transportation arrangements needed? No  If their condition worsens, is the pt aware to call PCP or go to the Emergency Dept.? Yes  Was the patient  provided with contact information for the PCP's office or ED? Yes  Was to pt encouraged to call back with questions or concerns? Yes

## 2019-08-09 NOTE — Telephone Encounter (Signed)
Tried to call pt.  Left her a message.  Regarding the potassium, she can take this on the days she takes torsemide.  Nephrology recommended torsemide daily prn.  On the days she takes torsemide, she can take the potassium.  Let me know if any questions or any problems.    Thanks   Dr Nicki Reaper

## 2019-08-11 NOTE — Telephone Encounter (Signed)
No answer, no voicemail.

## 2019-08-11 NOTE — Telephone Encounter (Signed)
Please call and get more information regarding pts breathing, blood pressure, etc.  Confirm no acute issues going on.

## 2019-08-11 NOTE — Telephone Encounter (Signed)
Last BP that was taken was 177/85. Breathing has not worsened but has not improved after doing 2 day prednisone. Confirmed doing ok at this time. She has an Appt tomorrow with nephrology and appt with heart failure clinic on 3/31. O2 94-95 at rest. Low 90s while ambulating. Advised that we would follow up with her and can let us know if she needs anything.

## 2019-08-11 NOTE — Telephone Encounter (Signed)
Patient confirms understanding of how to take torsemide prn and will add potassium only on days when she takes torsemide. States she has taken torsemide everyday except 2 days and the swelling does not seem to stay away long. Monitoring BP with yesterday 208/85 and today 177/85. Breathing has not improved since completing prescribed prednisone for 2 days. Today she has used symbicort once, rescue inhaler twice and had one nebulizer treatment. Oxygen while resting 94-95, ambulating 92. HFU currently scheduled 08/19/19.

## 2019-08-12 NOTE — Telephone Encounter (Signed)
Reviewed. Spoke with nurse.  She discussed earlier appt with pt.  Did not feel needed given had noted f/u appts scheduled.  Will let us know if problems.

## 2019-08-13 ENCOUNTER — Inpatient Hospital Stay
Admission: EM | Admit: 2019-08-13 | Discharge: 2019-08-22 | DRG: 291 | Disposition: A | Discharge status: Skilled Nursing Facility | Payer: Medicare Other | Attending: Family Medicine | Admitting: Family Medicine

## 2019-08-13 ENCOUNTER — Other Ambulatory Visit: Payer: Self-pay

## 2019-08-13 ENCOUNTER — Emergency Department: Payer: Medicare Other

## 2019-08-13 ENCOUNTER — Ambulatory Visit: Payer: Medicare Other | Admitting: Family

## 2019-08-13 DIAGNOSIS — Z8041 Family history of malignant neoplasm of ovary: Secondary | ICD-10-CM | POA: Diagnosis not present

## 2019-08-13 DIAGNOSIS — Z803 Family history of malignant neoplasm of breast: Secondary | ICD-10-CM | POA: Diagnosis not present

## 2019-08-13 DIAGNOSIS — Z808 Family history of malignant neoplasm of other organs or systems: Secondary | ICD-10-CM

## 2019-08-13 DIAGNOSIS — M810 Age-related osteoporosis without current pathological fracture: Secondary | ICD-10-CM | POA: Diagnosis present

## 2019-08-13 DIAGNOSIS — Z8551 Personal history of malignant neoplasm of bladder: Secondary | ICD-10-CM

## 2019-08-13 DIAGNOSIS — R739 Hyperglycemia, unspecified: Secondary | ICD-10-CM | POA: Diagnosis not present

## 2019-08-13 DIAGNOSIS — J918 Pleural effusion in other conditions classified elsewhere: Secondary | ICD-10-CM | POA: Diagnosis present

## 2019-08-13 DIAGNOSIS — I739 Peripheral vascular disease, unspecified: Secondary | ICD-10-CM | POA: Diagnosis present

## 2019-08-13 DIAGNOSIS — J439 Emphysema, unspecified: Secondary | ICD-10-CM | POA: Diagnosis not present

## 2019-08-13 DIAGNOSIS — R0600 Dyspnea, unspecified: Secondary | ICD-10-CM | POA: Diagnosis present

## 2019-08-13 DIAGNOSIS — J9811 Atelectasis: Secondary | ICD-10-CM | POA: Diagnosis not present

## 2019-08-13 DIAGNOSIS — Z9889 Other specified postprocedural states: Secondary | ICD-10-CM | POA: Diagnosis not present

## 2019-08-13 DIAGNOSIS — R197 Diarrhea, unspecified: Secondary | ICD-10-CM | POA: Diagnosis present

## 2019-08-13 DIAGNOSIS — I509 Heart failure, unspecified: Secondary | ICD-10-CM

## 2019-08-13 DIAGNOSIS — E46 Unspecified protein-calorie malnutrition: Secondary | ICD-10-CM | POA: Diagnosis not present

## 2019-08-13 DIAGNOSIS — E785 Hyperlipidemia, unspecified: Secondary | ICD-10-CM | POA: Diagnosis present

## 2019-08-13 DIAGNOSIS — Z832 Family history of diseases of the blood and blood-forming organs and certain disorders involving the immune mechanism: Secondary | ICD-10-CM

## 2019-08-13 DIAGNOSIS — Z66 Do not resuscitate: Secondary | ICD-10-CM | POA: Diagnosis present

## 2019-08-13 DIAGNOSIS — Z825 Family history of asthma and other chronic lower respiratory diseases: Secondary | ICD-10-CM

## 2019-08-13 DIAGNOSIS — M2550 Pain in unspecified joint: Secondary | ICD-10-CM | POA: Diagnosis not present

## 2019-08-13 DIAGNOSIS — I5033 Acute on chronic diastolic (congestive) heart failure: Secondary | ICD-10-CM | POA: Diagnosis present

## 2019-08-13 DIAGNOSIS — J432 Centrilobular emphysema: Secondary | ICD-10-CM | POA: Diagnosis present

## 2019-08-13 DIAGNOSIS — Z7189 Other specified counseling: Secondary | ICD-10-CM | POA: Diagnosis not present

## 2019-08-13 DIAGNOSIS — M6281 Muscle weakness (generalized): Secondary | ICD-10-CM | POA: Diagnosis not present

## 2019-08-13 DIAGNOSIS — R260 Ataxic gait: Secondary | ICD-10-CM | POA: Diagnosis not present

## 2019-08-13 DIAGNOSIS — I959 Hypotension, unspecified: Secondary | ICD-10-CM | POA: Diagnosis not present

## 2019-08-13 DIAGNOSIS — G9009 Other idiopathic peripheral autonomic neuropathy: Secondary | ICD-10-CM | POA: Diagnosis not present

## 2019-08-13 DIAGNOSIS — Z86711 Personal history of pulmonary embolism: Secondary | ICD-10-CM | POA: Diagnosis not present

## 2019-08-13 DIAGNOSIS — J449 Chronic obstructive pulmonary disease, unspecified: Secondary | ICD-10-CM | POA: Diagnosis present

## 2019-08-13 DIAGNOSIS — N179 Acute kidney failure, unspecified: Secondary | ICD-10-CM | POA: Diagnosis present

## 2019-08-13 DIAGNOSIS — Z87891 Personal history of nicotine dependence: Secondary | ICD-10-CM

## 2019-08-13 DIAGNOSIS — K219 Gastro-esophageal reflux disease without esophagitis: Secondary | ICD-10-CM | POA: Diagnosis not present

## 2019-08-13 DIAGNOSIS — J9601 Acute respiratory failure with hypoxia: Secondary | ICD-10-CM | POA: Diagnosis not present

## 2019-08-13 DIAGNOSIS — I10 Essential (primary) hypertension: Secondary | ICD-10-CM | POA: Diagnosis present

## 2019-08-13 DIAGNOSIS — E43 Unspecified severe protein-calorie malnutrition: Secondary | ICD-10-CM | POA: Diagnosis present

## 2019-08-13 DIAGNOSIS — R2681 Unsteadiness on feet: Secondary | ICD-10-CM | POA: Diagnosis not present

## 2019-08-13 DIAGNOSIS — Z1501 Genetic susceptibility to malignant neoplasm of breast: Secondary | ICD-10-CM | POA: Diagnosis not present

## 2019-08-13 DIAGNOSIS — J441 Chronic obstructive pulmonary disease with (acute) exacerbation: Secondary | ICD-10-CM | POA: Diagnosis not present

## 2019-08-13 DIAGNOSIS — Z7401 Bed confinement status: Secondary | ICD-10-CM | POA: Diagnosis not present

## 2019-08-13 DIAGNOSIS — J90 Pleural effusion, not elsewhere classified: Secondary | ICD-10-CM | POA: Diagnosis present

## 2019-08-13 DIAGNOSIS — N281 Cyst of kidney, acquired: Secondary | ICD-10-CM | POA: Diagnosis not present

## 2019-08-13 DIAGNOSIS — I2510 Atherosclerotic heart disease of native coronary artery without angina pectoris: Secondary | ICD-10-CM | POA: Diagnosis present

## 2019-08-13 DIAGNOSIS — D45 Polycythemia vera: Secondary | ICD-10-CM | POA: Diagnosis present

## 2019-08-13 DIAGNOSIS — Z6822 Body mass index (BMI) 22.0-22.9, adult: Secondary | ICD-10-CM

## 2019-08-13 DIAGNOSIS — N1832 Chronic kidney disease, stage 3b: Secondary | ICD-10-CM | POA: Diagnosis present

## 2019-08-13 DIAGNOSIS — R0602 Shortness of breath: Secondary | ICD-10-CM | POA: Diagnosis not present

## 2019-08-13 DIAGNOSIS — Z515 Encounter for palliative care: Secondary | ICD-10-CM

## 2019-08-13 DIAGNOSIS — Z8249 Family history of ischemic heart disease and other diseases of the circulatory system: Secondary | ICD-10-CM

## 2019-08-13 DIAGNOSIS — J9611 Chronic respiratory failure with hypoxia: Secondary | ICD-10-CM | POA: Diagnosis present

## 2019-08-13 DIAGNOSIS — G629 Polyneuropathy, unspecified: Secondary | ICD-10-CM | POA: Diagnosis present

## 2019-08-13 DIAGNOSIS — Z85828 Personal history of other malignant neoplasm of skin: Secondary | ICD-10-CM | POA: Diagnosis not present

## 2019-08-13 DIAGNOSIS — Z79899 Other long term (current) drug therapy: Secondary | ICD-10-CM | POA: Diagnosis not present

## 2019-08-13 DIAGNOSIS — I130 Hypertensive heart and chronic kidney disease with heart failure and stage 1 through stage 4 chronic kidney disease, or unspecified chronic kidney disease: Principal | ICD-10-CM | POA: Diagnosis present

## 2019-08-13 DIAGNOSIS — Z95828 Presence of other vascular implants and grafts: Secondary | ICD-10-CM

## 2019-08-13 DIAGNOSIS — E876 Hypokalemia: Secondary | ICD-10-CM | POA: Diagnosis not present

## 2019-08-13 DIAGNOSIS — N183 Chronic kidney disease, stage 3 unspecified: Secondary | ICD-10-CM | POA: Diagnosis not present

## 2019-08-13 DIAGNOSIS — R269 Unspecified abnormalities of gait and mobility: Secondary | ICD-10-CM | POA: Diagnosis present

## 2019-08-13 DIAGNOSIS — Z20822 Contact with and (suspected) exposure to covid-19: Secondary | ICD-10-CM | POA: Diagnosis present

## 2019-08-13 DIAGNOSIS — Z800 Family history of malignant neoplasm of digestive organs: Secondary | ICD-10-CM

## 2019-08-13 DIAGNOSIS — Z9221 Personal history of antineoplastic chemotherapy: Secondary | ICD-10-CM

## 2019-08-13 DIAGNOSIS — R911 Solitary pulmonary nodule: Secondary | ICD-10-CM | POA: Diagnosis not present

## 2019-08-13 DIAGNOSIS — C679 Malignant neoplasm of bladder, unspecified: Secondary | ICD-10-CM | POA: Diagnosis not present

## 2019-08-13 DIAGNOSIS — R0902 Hypoxemia: Secondary | ICD-10-CM | POA: Diagnosis not present

## 2019-08-13 DIAGNOSIS — Z8262 Family history of osteoporosis: Secondary | ICD-10-CM

## 2019-08-13 DIAGNOSIS — R2689 Other abnormalities of gait and mobility: Secondary | ICD-10-CM | POA: Diagnosis not present

## 2019-08-13 DIAGNOSIS — Z9861 Coronary angioplasty status: Secondary | ICD-10-CM

## 2019-08-13 LAB — CBC WITH DIFFERENTIAL/PLATELET
Abs Immature Granulocytes: 0.37 10*3/uL — ABNORMAL HIGH (ref 0.00–0.07)
Basophils Absolute: 0.1 10*3/uL (ref 0.0–0.1)
Basophils Relative: 0 %
Eosinophils Absolute: 0 10*3/uL (ref 0.0–0.5)
Eosinophils Relative: 0 %
HCT: 40.5 % (ref 36.0–46.0)
Hemoglobin: 13.7 g/dL (ref 12.0–15.0)
Immature Granulocytes: 2 %
Lymphocytes Relative: 8 %
Lymphs Abs: 1.6 10*3/uL (ref 0.7–4.0)
MCH: 30.8 pg (ref 26.0–34.0)
MCHC: 33.8 g/dL (ref 30.0–36.0)
MCV: 91 fL (ref 80.0–100.0)
Monocytes Absolute: 2 10*3/uL — ABNORMAL HIGH (ref 0.1–1.0)
Monocytes Relative: 10 %
Neutro Abs: 16.5 10*3/uL — ABNORMAL HIGH (ref 1.7–7.7)
Neutrophils Relative %: 80 %
Platelets: 217 10*3/uL (ref 150–400)
RBC: 4.45 MIL/uL (ref 3.87–5.11)
RDW: 13.3 % (ref 11.5–15.5)
WBC: 20.6 10*3/uL — ABNORMAL HIGH (ref 4.0–10.5)
nRBC: 0 % (ref 0.0–0.2)

## 2019-08-13 LAB — FIBRIN DERIVATIVES D-DIMER (ARMC ONLY): Fibrin derivatives D-dimer (ARMC): 375.41 ng/mL (FEU) (ref 0.00–499.00)

## 2019-08-13 LAB — BRAIN NATRIURETIC PEPTIDE: B Natriuretic Peptide: 1107 pg/mL — ABNORMAL HIGH (ref 0.0–100.0)

## 2019-08-13 LAB — BASIC METABOLIC PANEL
Anion gap: 11 (ref 5–15)
BUN: 24 mg/dL — ABNORMAL HIGH (ref 8–23)
CO2: 27 mmol/L (ref 22–32)
Calcium: 9.4 mg/dL (ref 8.9–10.3)
Chloride: 98 mmol/L (ref 98–111)
Creatinine, Ser: 1.09 mg/dL — ABNORMAL HIGH (ref 0.44–1.00)
GFR calc Af Amer: 55 mL/min — ABNORMAL LOW (ref >60)
GFR calc non Af Amer: 47 mL/min — ABNORMAL LOW (ref >60)
Glucose, Bld: 196 mg/dL — ABNORMAL HIGH (ref 70–99)
Potassium: 3.7 mmol/L (ref 3.5–5.1)
Sodium: 136 mmol/L (ref 135–145)

## 2019-08-13 LAB — TROPONIN I (HIGH SENSITIVITY)
Troponin I (High Sensitivity): 22 ng/L — ABNORMAL HIGH (ref <18)
Troponin I (High Sensitivity): 21 ng/L — ABNORMAL HIGH (ref <18)

## 2019-08-13 LAB — PROCALCITONIN: Procalcitonin: 0.15 ng/mL

## 2019-08-13 MED ORDER — HYDRALAZINE HCL 50 MG PO TABS
100.0000 mg | ORAL_TABLET | Freq: Three times a day (TID) | ORAL | Status: DC
  Administered 2019-08-13 – 2019-08-21 (×23): 100 mg via ORAL
  Filled 2019-08-13 (×24): qty 2

## 2019-08-13 MED ORDER — ENOXAPARIN SODIUM 40 MG/0.4ML ~~LOC~~ SOLN
40.0000 mg | SUBCUTANEOUS | Status: DC
  Filled 2019-08-13: qty 0.4

## 2019-08-13 MED ORDER — IPRATROPIUM-ALBUTEROL 0.5-2.5 (3) MG/3ML IN SOLN
3.0000 mL | RESPIRATORY_TRACT | Status: DC
  Administered 2019-08-13 – 2019-08-18 (×26): 3 mL via RESPIRATORY_TRACT
  Filled 2019-08-13 (×27): qty 3

## 2019-08-13 MED ORDER — CALCIUM CARBONATE ANTACID 500 MG PO CHEW
1.0000 | CHEWABLE_TABLET | Freq: Three times a day (TID) | ORAL | Status: DC | PRN
  Administered 2019-08-18: 200 mg via ORAL
  Filled 2019-08-13: qty 1

## 2019-08-13 MED ORDER — POLYETHYLENE GLYCOL 3350 17 G PO PACK: 17.0000 g | PACK | Freq: Two times a day (BID) | ORAL | Status: DC | PRN

## 2019-08-13 MED ORDER — GUAIFENESIN-DM 100-10 MG/5ML PO SYRP: 10.0000 mL | ORAL_SOLUTION | Freq: Four times a day (QID) | ORAL | Status: DC | PRN

## 2019-08-13 MED ORDER — CLOPIDOGREL BISULFATE 75 MG PO TABS: 75.0000 mg | ORAL_TABLET | Freq: Every day | ORAL | Status: DC

## 2019-08-13 MED ORDER — ENOXAPARIN SODIUM 40 MG/0.4ML ~~LOC~~ SOLN
40.0000 mg | SUBCUTANEOUS | Status: DC
  Administered 2019-08-13 – 2019-08-14 (×2): 40 mg via SUBCUTANEOUS
  Filled 2019-08-13: qty 0.4

## 2019-08-13 MED ORDER — AMLODIPINE BESYLATE 10 MG PO TABS
10.0000 mg | ORAL_TABLET | Freq: Every day | ORAL | Status: DC
  Administered 2019-08-13 – 2019-08-22 (×9): 10 mg via ORAL
  Filled 2019-08-13: qty 2
  Filled 2019-08-13 (×8): qty 1

## 2019-08-13 MED ORDER — ASPIRIN EC 81 MG PO TBEC: 81.0000 mg | DELAYED_RELEASE_TABLET | Freq: Every day | ORAL | Status: DC

## 2019-08-13 MED ORDER — ONDANSETRON 4 MG PO TBDP
4.0000 mg | ORAL_TABLET | Freq: Three times a day (TID) | ORAL | Status: DC | PRN
  Filled 2019-08-13: qty 1

## 2019-08-13 MED ORDER — METOPROLOL TARTRATE 50 MG PO TABS
100.0000 mg | ORAL_TABLET | Freq: Two times a day (BID) | ORAL | Status: DC
  Administered 2019-08-13 – 2019-08-14 (×3): 100 mg via ORAL
  Filled 2019-08-13 (×3): qty 2

## 2019-08-13 MED ORDER — LABETALOL HCL 5 MG/ML IV SOLN: 10.0000 mg | Freq: Once | INTRAVENOUS | Status: DC

## 2019-08-13 MED ORDER — ACETAMINOPHEN 500 MG PO TABS
1000.0000 mg | ORAL_TABLET | Freq: Three times a day (TID) | ORAL | Status: DC | PRN
  Administered 2019-08-14 – 2019-08-15 (×3): 1000 mg via ORAL
  Filled 2019-08-13 (×3): qty 2

## 2019-08-13 MED ORDER — PRAVASTATIN SODIUM 20 MG PO TABS
40.0000 mg | ORAL_TABLET | Freq: Every day | ORAL | Status: DC
  Administered 2019-08-13 – 2019-08-21 (×9): 40 mg via ORAL
  Filled 2019-08-13 (×5): qty 2
  Filled 2019-08-13: qty 1
  Filled 2019-08-13 (×5): qty 2

## 2019-08-13 MED ORDER — FUROSEMIDE 10 MG/ML IJ SOLN
40.0000 mg | Freq: Once | INTRAMUSCULAR | Status: AC
  Administered 2019-08-13: 40 mg via INTRAVENOUS
  Filled 2019-08-13: qty 4

## 2019-08-13 MED ORDER — AZITHROMYCIN 500 MG PO TABS
500.0000 mg | ORAL_TABLET | Freq: Every day | ORAL | Status: DC
  Administered 2019-08-13 – 2019-08-20 (×7): 500 mg via ORAL
  Filled 2019-08-13 (×2): qty 2
  Filled 2019-08-13 (×2): qty 1
  Filled 2019-08-13 (×2): qty 2
  Filled 2019-08-13 (×2): qty 1

## 2019-08-13 MED ORDER — DOCUSATE SODIUM 100 MG PO CAPS: 100.0000 mg | ORAL_CAPSULE | Freq: Two times a day (BID) | ORAL | Status: DC | PRN

## 2019-08-13 MED ORDER — METHYLPREDNISOLONE SODIUM SUCC 40 MG IJ SOLR
40.0000 mg | Freq: Every day | INTRAMUSCULAR | Status: DC
  Administered 2019-08-13 – 2019-08-14 (×2): 40 mg via INTRAVENOUS
  Filled 2019-08-13 (×2): qty 1

## 2019-08-13 MED ORDER — ONDANSETRON HCL 4 MG/2ML IJ SOLN: 4.0000 mg | Freq: Four times a day (QID) | INTRAMUSCULAR | Status: DC | PRN

## 2019-08-13 MED ORDER — HYDRALAZINE HCL 20 MG/ML IJ SOLN
10.0000 mg | Freq: Four times a day (QID) | INTRAMUSCULAR | Status: DC | PRN
  Administered 2019-08-13 – 2019-08-17 (×4): 10 mg via INTRAVENOUS
  Filled 2019-08-13 (×4): qty 1

## 2019-08-13 MED ORDER — LABETALOL HCL 5 MG/ML IV SOLN
10.0000 mg | Freq: Once | INTRAVENOUS | Status: AC
  Administered 2019-08-13: 18:00:00 10 mg via INTRAVENOUS
  Filled 2019-08-13: qty 4

## 2019-08-13 MED ORDER — ALUM & MAG HYDROXIDE-SIMETH 200-200-20 MG/5ML PO SUSP
15.0000 mL | Freq: Four times a day (QID) | ORAL | Status: DC | PRN
  Administered 2019-08-21 (×2): 15 mL via ORAL
  Filled 2019-08-13 (×2): qty 30

## 2019-08-13 MED ORDER — GABAPENTIN 100 MG PO CAPS
200.0000 mg | ORAL_CAPSULE | Freq: Every day | ORAL | Status: DC
  Administered 2019-08-13 – 2019-08-21 (×8): 200 mg via ORAL
  Filled 2019-08-13 (×10): qty 2

## 2019-08-13 NOTE — ED Notes (Addendum)
Pt's belonging given/received to pt.

## 2019-08-13 NOTE — ED Notes (Signed)
Pt sat at 97% on 2L [place].

## 2019-08-13 NOTE — ED Notes (Signed)
Verbal order given by Dr. Billie Ruddy to give 10mg  labetalol IV stat

## 2019-08-13 NOTE — ED Notes (Signed)
Pt dropping to 90% periodically while being triaged. Pt placed on 2L [place].

## 2019-08-13 NOTE — H&P (Addendum)
History and Physical    Tina [name] GNF:[id number] DOB: 08-Feb-1935 DOA: 08/13/2019  PCP: Einar Pheasant, MD  Patient coming from: home  I have personally briefly reviewed patient's old medical records in [place]  Chief Complaint: persistent dyspnea  HPI: Tina [name] a 85 y.o.Caucasian femalewith medical history significant forCOPD,hypertension,diastolic CHF,CAD, CKD 3b whopresented with persistent dyspnea.  Pt was recently hospitalized from 08/02/19 to 08/06/19 for dyspnea and treated as COPD exacerbation and discharged home to finish 2 more days of prednisone 40 mg daily (for a total of 5 days).  Prior to discharge, ambulation test showed pt sating 93% in RA while ambulating, so pt didn't qualify for home O2.  Pt reported that her breathing was maybe better on the day of discharge, but soon after she went home, breathing got worse again.  Pt reported using neb tx at home multiple times in the morning and at night.  Pt took her torsemide PRN as directed, which was about every other day.  No fever, chest pain, abdominal pain, N/V/D, dysuria, increased swelling.  Pt reported taking her BP medications as directed.   ED Course: initial vitals: afebrile, pulse 65, BP 187/73, RR 24, sating 94% on room air.  Per ED triage note, pt dropped to 90% periodically, so was placed on 2L [place].  Labs notable for WBC 20.6.  CXR showed "Moderate left pleural effusion, increased from prior. Hyper expanded lungs with chronically coarsened interstitial markings."     Assessment/Plan Active Problems:   Dyspnea  # Dyspnea 2/2 COPD exacerbation --Able to maintain O2 sat, however, has increased RR and visibly uncomfortable.  Lung exams revealed coarse wheezes and rhonchi.  Presented with WBC 20.6, though no fever, or consolidation seen on CXR.  Does have mod left pleural effusion, but doesn't seem large enough to cause this amount of dyspnea.  Was recently hospitalized for COPD exacerbation only  about a week ago, and finished 5 days of prednisone 40 mg daily, maybe needs a longer course or taper? PLAN: --IV solumedrol 40 mg daily --DuoNeb q4h scheduled --D-dimer (returned neg, so ruled out PE) --obtain procal --Start azithromycin 500 mg daily --Pulm consult tomorrow  Chronic diastolic dysfunction CHF  --Last TTE on 08/03/19 showed LVEF 50-55% with grade I diastolic dysfunction.  Pt was discharged with PRN torsemide due to AKI.  CXR on presentation didn't show pulm edema, however, did have mod pleural effusion. PLAN: --BNP --IV lasix 40 mg x1 --Strict I/O --Low sodium diet  Mod left pleural effusion --increased since 08/02/19.  Maybe contributing to dyspnea. PLAN: --Thoracentesis tomorrow --diuresis  CKD 3b --Cr 1.09 on presentation, which is baseline.  Uncontrolled HTN --was discharged on increase hydralazine to 100 mg TID, metop to 100 mg BID and home amlodipine (home losartan held due to AKI), and noted to have elevated BP at home. PLAN: --continue home amlodipine, hydralazine, and metop --IV lasix 40 mg x1 --IV hydralazine PRN  Historycoronary artery disease Continue statin Hold aspirin, Plavixfor upcoming thoracentesis  Peripheral neuropathy and gait dysfunction --continue home gabapentin   DVT prophylaxis: Lovenox SQ Code Status: DNR  Family Communication: daughter updated at bedside  Disposition Plan: Home  Admission status: Inpatient   Review of Systems: As per HPI otherwise 10 point review of systems negative.   Past Medical History:  Diagnosis Date  . Anemia   . Asthma   . Bladder cancer ([place])   . BRCA positive   . CAD (coronary artery disease)   . CHF (congestive  heart failure) ([place])   . COPD (chronic obstructive pulmonary disease) ([place])   . Emphysema lung ([place])   . GERD (gastroesophageal reflux disease)   . History of colon polyps   . Hypercholesterolemia   . Hyperglycemia   . Hypertension   . Lung nodules   . Osteoporosis   .  Personal history of tobacco use, presenting hazards to health 11/25/2014  . Polycythemia vera(238.4)   . Renal cyst   . Skin cancer     Past Surgical History:  Procedure Laterality Date  . ANGIOPLASTY     coronary (x1)  . COLONOSCOPY WITH PROPOFOL N/A 03/02/2015   Procedure: COLONOSCOPY WITH PROPOFOL;  Surgeon: Lollie Sails, MD;  Location: [facility] ENDOSCOPY;  Service: Endoscopy;  Laterality: N/A;  . COLONOSCOPY WITH PROPOFOL N/A 02/13/2017   Procedure: COLONOSCOPY WITH PROPOFOL;  Surgeon: [name], MD;  Location[facility]COPY;  Service: Endoscopy;  Laterality: N/A;  . CORONARY ANGIOPLASTY    . CORONARY ARTERY BYPASS GRAFT  09/17/2017   pt denies  . CYSTOSCOPY W/ RETROGRADES Bilateral 02/13/2017   Procedure: CYSTOSCOPY WITH RETROGRADE PYELOGRAM;  Surgeon: Hollice Espy, MD;  Location: [facility] ORS;  Service: Urology;  Laterality: Bilateral;  . CYSTOSCOPY W/ RETROGRADES Bilateral 09/19/2017   Procedure: CYSTOSCOPY WITH RETROGRADE PYELOGRAM;  Surgeon: Hollice Espy, MD;  Location: [facility] ORS;  Service: Urology;  Laterality: Bilateral;  . CYSTOSCOPY WITH BIOPSY  03/26/2017   Procedure: CYSTOSCOPY WITH BIOPSY;  Surgeon: Hollice Espy, MD;  Location: [facility] ORS;  Service: Urology;;  . Consuela Mimes WITH STENT PLACEMENT Bilateral 02/13/2017   Procedure: CYSTOSCOPY WITH STENT PLACEMENT;  Surgeon: Hollice Espy, MD;  Location: [facility] ORS;  Service: Urology;  Laterality: Bilateral;  . CYSTOSCOPY WITH STENT PLACEMENT Bilateral 03/26/2017   Procedure: CYSTOSCOPY WITH STENT EXCHANGE;  Surgeon: Hollice Espy, MD;  Location: [facility] ORS;  Service: Urology;  Laterality: Bilateral;  . ESOPHAGOGASTRODUODENOSCOPY (EGD) WITH PROPOFOL N/A 02/13/2017   Procedure: ESOPHAGOGASTRODUODENOSCOPY (EGD) WITH PROPOFOL;  Surgeon: [name], MD;  Location: [facility] ENDOSCOPY;  Service: Endoscopy;  Laterality: N/A;  . PORTA CATH INSERTION N/A 02/28/2017   Procedure: PORTA CATH INSERTION;  Surgeon: Algernon Huxley, MD;  Location:  [place] CV LAB;  Service: Cardiovascular;  Laterality: N/A;  . PORTA CATH REMOVAL N/A 12/24/2017   Procedure: PORTA CATH REMOVAL;  Surgeon: Algernon Huxley, MD;  Location: [place] CV LAB;  Service: Cardiovascular;  Laterality: N/A;  . SALPINGOOPHORECTOMY    . TONSILECTOMY/ADENOIDECTOMY WITH MYRINGOTOMY    . TONSILLECTOMY    . TRANSURETHRAL RESECTION OF BLADDER TUMOR N/A 02/13/2017   Procedure: TRANSURETHRAL RESECTION OF BLADDER TUMOR (TURBT);  Surgeon: Hollice Espy, MD;  Location: [facility] ORS;  Service: Urology;  Laterality: N/A;  . TUBAL LIGATION    . UPPER GI ENDOSCOPY  02/13/2017  . URETEROSCOPY Left 09/19/2017   Procedure: URETEROSCOPY;  Surgeon: Hollice Espy, MD;  Location: [facility] ORS;  Service: Urology;  Laterality: Left;  Marland Kitchen VISCERAL ANGIOGRAPHY N/A 01/23/2019   Procedure: VISCERAL ANGIOGRAPHY;  Surgeon: Algernon Huxley, MD;  Location: [place] [facility];  Service: Cardiovascular;  Laterality: N/A;  . VISCERAL ANGIOGRAPHY N/A 06/09/2019   Procedure: VISCERAL ANGIOGRAPHY;  Surgeon: Algernon Huxley, MD;  Location: [place] CV LAB;  Service: Cardiovascular;  Laterality: N/A;  . VISCERAL ARTERY INTERVENTION N/A 02/12/2017   Procedure: VISCERAL ARTERY INTERVENTION;  Surgeon: Algernon Huxley, MD;  Location: [place] [facility] LAB;  Service: Cardiovascular;  Laterality: N/A;     reports that she quit smoking about 15 years  ago. Her smoking use included cigarettes. She has a 45.00 pack-year smoking history. She has never used smokeless tobacco. She reports previous alcohol use. She reports that she does not use drugs.  Allergies  Allergen Reactions  . Evista [Raloxifene] Other (See Comments)    Night sweats   . Fluticasone-Salmeterol Other (See Comments)    Cough, "chokes me"     Family History  Problem Relation Age of Onset  . Cirrhosis Father        died age 69  . Alcohol abuse Father   . Asthma Mother   . Congestive Heart Failure Mother   . Breast cancer Mother        2 (1/2  sisters)  . Osteoarthritis Mother   . Colon cancer Mother   . Lupus Sister   . Alcohol abuse Sister   . Ovarian cancer Sister   . Osteoporosis Sister   . Skin cancer Sister   . Breast cancer Cousin   . Breast cancer Maternal Aunt      Prior to Admission medications   Medication Sig Start Date End Date Taking? Authorizing Provider  albuterol (PROVENTIL HFA;VENTOLIN HFA) 108 (90 Base) MCG/ACT inhaler Inhale 2 puffs into the lungs every 6 (six) hours as needed for wheezing or shortness of breath. 10/22/17  Yes Mody, Sital, MD  amLODipine (NORVASC) 5 MG tablet TAKE 1 TABLET BY MOUTH  TWICE DAILY Patient taking differently: Take 5 mg by mouth 2 (two) times daily.  03/18/19  Yes Einar Pheasant, MD  aspirin EC 81 MG tablet Take 1 tablet (81 mg total) by mouth daily. 01/23/19  Yes Dew, Erskine Squibb, MD  cholecalciferol (VITAMIN D) 1000 units tablet Take 1,000 Units by mouth daily.   Yes [provider]  clopidogrel (PLAVIX) 75 MG tablet Take 1 tablet (75 mg total) by mouth daily. 03/29/18  Yes Einar Pheasant, MD  cyanocobalamin 1000 MCG tablet Take 2,000 mcg by mouth daily.   Yes [provider]  fluticasone (FLONASE) 50 MCG/ACT nasal spray USE 2 SPRAYS INTO BOTH  NOSTRILS DAILY AS NEEDED  FOR ALLERGIES. Patient taking differently: Place 2 sprays into both nostrils daily.  10/01/18  Yes Einar Pheasant, MD  gabapentin (NEURONTIN) 100 MG capsule Take 2 capsules by mouth at bedtime. 06/02/19  Yes [provider]  hydrALAZINE (APRESOLINE) 100 MG tablet Take 1 tablet (100 mg total) by mouth 3 (three) times daily. Patient taking differently: Take 100 mg by mouth 3 (three) times daily. (take with 45m doses for total 1557mdoses) 08/06/19  Yes GrNicole Kindred, DO  hydrALAZINE (APRESOLINE) 50 MG tablet Take 50 mg by mouth 3 (three) times daily. (take with 10023mablets for 150m58mtal doses)   Yes [provider]  ipratropium-albuterol (DUONEB) 0.5-2.5 (3) MG/3ML SOLN Take 3  mLs by nebulization every 6 (six) hours as needed (for shortness of breath). 11/14/14  Yes GaylJoanne Gavel  lovastatin (MEVACOR) 40 MG tablet TAKE 1 TABLET BY MOUTH  DAILY IN THE EVENING Patient taking differently: Take 40 mg by mouth every evening.  06/25/19  Yes ScotEinar Pheasant  metoprolol tartrate (LOPRESSOR) 100 MG tablet Take 1 tablet (100 mg total) by mouth 2 (two) times daily. 08/06/19  Yes GrifNicole KindredDO  SYMBICORT 160-4.5 MCG/ACT inhaler USE 2 PUFFS BY MOUTH TWO  TIMES DAILY Patient taking differently: Inhale 2 puffs into the lungs in the morning and at bedtime.  06/30/19  Yes ScotEinar Pheasant  torsemide (DEMADEX) 20 MG tablet  Take 1 tablet (20 mg total) by mouth daily as needed (swelling or shortness of breath). 08/06/19  Yes Ezekiel Slocumb, DO    Physical Exam: Vitals:   08/13/19 1530 08/13/19 1630 08/13/19 1730 08/13/19 1844  BP: (!) 193/73 (!) 196/61 (!) 188/79 (!) 174/61  Pulse: 76  68 65  Resp: (!) 25 (!) 24 13 (!) 21  Temp:    98.8 F (37.1 C)  TempSrc:    Oral  SpO2:   94% 96%  Weight:      Height:        Constitutional: NAD, AAOx3 HEENT: conjunctivae and lids normal, EOMI CV: RRR no M,R,G. Distal pulses +2.  No cyanosis.   RESP: diffuse rhonchi and coarse expiratory wheezes, increased RR, on 2L GI: +BS, NTND Extremities: Mild non-pitting edema in BLE SKIN: warm, dry Neuro: II - XII grossly intact.  Sensation intact Psych: Depressed mood and affect.  Appropriate judgement and reason   Labs on Admission: I have personally reviewed following labs and imaging studies  CBC: Recent Labs  Lab 08/13/19 1234  WBC 20.6*  NEUTROABS 16.5*  HGB 13.7  HCT 40.5  MCV 91.0  PLT 161   Basic Metabolic Panel: Recent Labs  Lab 08/13/19 1234  NA 136  K 3.7  CL 98  CO2 27  GLUCOSE 196*  BUN 24*  CREATININE 1.09*  CALCIUM 9.4   GFR: Estimated Creatinine Clearance: 35.8 mL/min (A) (by C-G formula based on SCr of 1.09 mg/dL (H)). Liver Function  Tests: No results for input(s): AST, ALT, ALKPHOS, BILITOT, PROT, ALBUMIN in the last 168 hours. No results for input(s): LIPASE, AMYLASE in the last 168 hours. No results for input(s): AMMONIA in the last 168 hours. Coagulation Profile: No results for input(s): INR, PROTIME in the last 168 hours. Cardiac Enzymes: No results for input(s): CKTOTAL, CKMB, CKMBINDEX, TROPONINI in the last 168 hours. BNP (last 3 results) No results for input(s): PROBNP in the last 8760 hours. HbA1C: No results for input(s): HGBA1C in the last 72 hours. CBG: No results for input(s): GLUCAP in the last 168 hours. Lipid Profile: No results for input(s): CHOL, HDL, LDLCALC, TRIG, CHOLHDL, LDLDIRECT in the last 72 hours. Thyroid Function Tests: No results for input(s): TSH, T4TOTAL, FREET4, T3FREE, THYROIDAB in the last 72 hours. Anemia Panel: No results for input(s): VITAMINB12, FOLATE, FERRITIN, TIBC, IRON, RETICCTPCT in the last 72 hours. Urine analysis:    Component Value Date/Time   COLORURINE YELLOW (A) 08/05/2019 1257   APPEARANCEUR CLEAR (A) 08/05/2019 1257   APPEARANCEUR Clear 09/13/2017 1502   LABSPEC 1.016 08/05/2019 1257   PHURINE 5.0 08/05/2019 1257   GLUCOSEU NEGATIVE 08/05/2019 1257   GLUCOSEU NEGATIVE 10/09/2017 1121   HGBUR NEGATIVE 08/05/2019 1257   BILIRUBINUR NEGATIVE 08/05/2019 1257   BILIRUBINUR Negative 09/13/2017 1502   KETONESUR NEGATIVE 08/05/2019 1257   PROTEINUR NEGATIVE 08/05/2019 1257   UROBILINOGEN 0.2 10/09/2017 1121   NITRITE NEGATIVE 08/05/2019 1257   LEUKOCYTESUR NEGATIVE 08/05/2019 1257    Radiological Exams on Admission: DG Chest 2 View  Result Date: 08/13/2019 CLINICAL DATA:  Shortness of breath EXAM: CHEST - 2 VIEW COMPARISON:  08/02/2019 FINDINGS: The heart size and mediastinal contours are stable. Atherosclerotic calcification of the aortic knob. Moderate left pleural effusion, increased from prior. Hyper expanded lungs with chronically coarsened interstitial  markings. No pneumothorax. Exaggerated thoracic kyphosis. IMPRESSION: 1. Moderate left pleural effusion, increased from prior. 2. COPD. Electronically Signed   By: Davina Poke D.O.   On:  08/13/2019 13:16      Enzo Bi MD [facility]  If 7PM-7AM, please contact night-coverage 08/13/2019, 7:08 PM

## 2019-08-13 NOTE — ED Provider Notes (Signed)
[facility] Emergency Department Provider Note  Time seen: 1:20 PM  I have reviewed the triage vital signs and the nursing notes.   HISTORY  Chief Complaint Shortness of Breath   HPI Tina [name] is a 85 y.o. female with a past medical history of anemia, asthma, CAD, CHF, COPD, hypertension, hyperlipidemia, presents to the emergency department for shortness of breath.  Patient was discharged from the hospital 08/06/2019 after an admission for shortness of breath.  Ultimately thought largely due to COPD.  Patient finished her steroids 08/08/2019.  Patient states she continues to feel short of breath in fact possibly worse than when she was discharged.  She also states her blood pressure has been running high currently 791 systolic.  Patient is only prescribed torsemide as needed, states she has not been taking it recently because she has not had any significant swelling.  Patient denies any chest pain.  Patient does not use oxygen at home currently satting in the upper 80s to low 90s on room air.  Patient is becoming shortness of breath with minimal exertion.  Past Medical History:  Diagnosis Date  . Anemia   . Asthma   . Bladder cancer ([place])   . BRCA positive   . CAD (coronary artery disease)   . CHF (congestive heart failure) ([place])   . COPD (chronic obstructive pulmonary disease) ([place])   . Emphysema lung ([place])   . GERD (gastroesophageal reflux disease)   . History of colon polyps   . Hypercholesterolemia   . Hyperglycemia   . Hypertension   . Lung nodules   . Osteoporosis   . Personal history of tobacco use, presenting hazards to health 11/25/2014  . Polycythemia vera(238.4)   . Renal cyst   . Skin cancer     Patient Active Problem List   Diagnosis Date Noted  . COPD with acute exacerbation ([place]) 08/02/2019  . Acute on chronic diastolic CHF (congestive heart failure) ([place]) 08/02/2019  . Abnormality of gait 08/02/2019  . Renal artery stenosis ([place])  07/01/2019  . DOE (dyspnea on exertion) 05/25/2019  . Diarrhea 05/10/2019  . AKI (acute kidney injury) ([place])   . Hypokalemia   . Foot drop 04/02/2019  . Low back pain 03/16/2019  . Leg pain 03/16/2019  . Aortic atherosclerosis ([place]) 01/07/2019  . Hoarseness 06/23/2018  . Unsteadiness 05/11/2018  . CHF (congestive heart failure) ([place]) 10/20/2017  . Pulmonary embolus ([place]) 06/28/2017  . 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 Goals of care, counseling/discussion 03/02/2017  . Abnormal computed tomography of gastrointestinal tract   . Noninfectious diarrhea   . Ulceration of intestine   . Hernia, hiatal   . Bladder mass   . Mesenteric ischemia due to arterial insufficiency ([place])   . Polycythemia vera ([place]) 10/21/2016  . Elevated troponin 06/14/2016  . Leukocytosis 06/14/2016  . Abdominal bruit 04/16/2016  . Abnormal mammogram 06/06/2015  . Essential hypertension 02/16/2015  . Numbness 01/24/2015  . Personal history of tobacco use, presenting hazards to health 11/25/2014  . BRCA positive   . COPD exacerbation ([place]) 11/14/2014  . Sensation of pressure in bladder area 08/15/2014  . Health care maintenance 08/15/2014  . Presence of coronary angioplasty implant and graft 09/30/2013  . History of colonic polyps 08/25/2012  . Female genuine stress incontinence 06/28/2012  . Infection of urinary tract 06/28/2012  . CAD (coronary artery disease) 05/23/2012  . COPD (chronic obstructive pulmonary disease) ([place]) 05/23/2012  . Hypercholesterolemia 05/23/2012  .  Hyperglycemia 05/23/2012  . Polycythemia, secondary 05/23/2012  . Renal cyst 05/23/2012  . Osteoporosis 05/23/2012    Past Surgical History:  Procedure Laterality Date  . ANGIOPLASTY     coronary (x1)  . COLONOSCOPY WITH PROPOFOL N/A 03/02/2015   Procedure: COLONOSCOPY WITH PROPOFOL;  Surgeon: Lollie Sails, MD;  Location: [facility] ENDOSCOPY;  Service: Endoscopy;  Laterality: N/A;  . COLONOSCOPY WITH PROPOFOL  N/A 02/13/2017   Procedure: COLONOSCOPY WITH PROPOFOL;  Surgeon: [name], MD;  Location: [facility] ENDOSCOPY;  Service: Endoscopy;  Laterality: N/A;  . CORONARY ANGIOPLASTY    . CORONARY ARTERY BYPASS GRAFT  09/17/2017   pt denies  . CYSTOSCOPY W/ RETROGRADES Bilateral 02/13/2017   Procedure: CYSTOSCOPY WITH RETROGRADE PYELOGRAM;  Surgeon: Hollice Espy, MD;  Location: [facility] ORS;  Service: Urology;  Laterality: Bilateral;  . CYSTOSCOPY W/ RETROGRADES Bilateral 09/19/2017   Procedure: CYSTOSCOPY WITH RETROGRADE PYELOGRAM;  Surgeon: Hollice Espy, MD;  Location: [facility] ORS;  Service: Urology;  Laterality: Bilateral;  . CYSTOSCOPY WITH BIOPSY  03/26/2017   Procedure: CYSTOSCOPY WITH BIOPSY;  Surgeon: Hollice Espy, MD;  Location: [facility] ORS;  Service: Urology;;  . Consuela Mimes WITH STENT PLACEMENT Bilateral 02/13/2017   Procedure: CYSTOSCOPY WITH STENT PLACEMENT;  Surgeon: Hollice Espy, MD;  Location: [facility] ORS;  Service: Urology;  Laterality: Bilateral;  . CYSTOSCOPY WITH STENT PLACEMENT Bilateral 03/26/2017   Procedure: CYSTOSCOPY WITH STENT EXCHANGE;  Surgeon: Hollice Espy, MD;  Location: [facility] ORS;  Service: Urology;  Laterality: Bilateral;  . ESOPHAGOGASTRODUODENOSCOPY (EGD) WITH PROPOFOL N/A 02/13/2017   Procedure: ESOPHAGOGASTRODUODENOSCOPY (EGD) WITH PROPOFOL;  Surgeon: [name], MD;  Location: [facility] ENDOSCOPY;  Service: Endoscopy;  Laterality: N/A;  . PORTA CATH INSERTION N/A 02/28/2017   Procedure: PORTA CATH INSERTION;  Surgeon: Algernon Huxley, MD;  Location: [place] CV LAB;  Service: Cardiovascular;  Laterality: N/A;  . PORTA CATH REMOVAL N/A 12/24/2017   Procedure: PORTA CATH REMOVAL;  Surgeon: Algernon Huxley, MD;  Location: [place] [facility] LAB;  Service: Cardiovascular;  Laterality: N/A;  . SALPINGOOPHORECTOMY    . TONSILECTOMY/ADENOIDECTOMY WITH MYRINGOTOMY    . TONSILLECTOMY    . TRANSURETHRAL RESECTION OF BLADDER TUMOR N/A 02/13/2017   Procedure: TRANSURETHRAL RESECTION OF  BLADDER TUMOR (TURBT);  Surgeon: Hollice Espy, MD;  Location: [facility] ORS;  Service: Urology;  Laterality: N/A;  . TUBAL LIGATION    . UPPER GI ENDOSCOPY  02/13/2017  . URETEROSCOPY Left 09/19/2017   Procedure: URETEROSCOPY;  Surgeon: Hollice Espy, MD;  Location: [facility] ORS;  Service: Urology;  Laterality: Left;  Marland Kitchen VISCERAL ANGIOGRAPHY N/A 01/23/2019   Procedure: VISCERAL ANGIOGRAPHY;  Surgeon: Algernon Huxley, MD;  Location: [place] [facility] LAB;  Service: Cardiovascular;  Laterality: N/A;  . VISCERAL ANGIOGRAPHY N/A 06/09/2019   Procedure: VISCERAL ANGIOGRAPHY;  Surgeon: Algernon Huxley, MD;  Location: [place] [facility];  Service: Cardiovascular;  Laterality: N/A;  . VISCERAL ARTERY INTERVENTION N/A 02/12/2017   Procedure: VISCERAL ARTERY INTERVENTION;  Surgeon: Algernon Huxley, MD;  Location: [place] [facility];  Service: Cardiovascular;  Laterality: N/A;    Prior to Admission medications   Medication Sig Start Date End Date Taking? Authorizing Provider  albuterol (PROVENTIL HFA;VENTOLIN HFA) 108 (90 Base) MCG/ACT inhaler Inhale 2 puffs into the lungs every 6 (six) hours as needed for wheezing or shortness of breath. 10/22/17   Bettey Costa, MD  amLODipine (NORVASC) 5 MG tablet TAKE 1 TABLET BY MOUTH  TWICE DAILY Patient taking differently: Take 5 mg by mouth 2 (two) times  daily.  03/18/19   Einar Pheasant, MD  aspirin EC 81 MG tablet Take 1 tablet (81 mg total) by mouth daily. 01/23/19   Algernon Huxley, MD  cholecalciferol (VITAMIN D) 1000 units tablet Take 1,000 Units by mouth daily.    [provider]  clopidogrel (PLAVIX) 75 MG tablet Take 1 tablet (75 mg total) by mouth daily. 03/29/18   Einar Pheasant, MD  cyanocobalamin 1000 MCG tablet Take 2,000 mcg by mouth daily.    [provider]  fluticasone (FLONASE) 50 MCG/ACT nasal spray USE 2 SPRAYS INTO BOTH  NOSTRILS DAILY AS NEEDED  FOR ALLERGIES. Patient taking differently: Place 2 sprays into both nostrils daily.  10/01/18    Einar Pheasant, MD  gabapentin (NEURONTIN) 100 MG capsule Take 2 capsules by mouth at bedtime. 06/02/19   [provider]  hydrALAZINE (APRESOLINE) 100 MG tablet Take 1 tablet (100 mg total) by mouth 3 (three) times daily. 08/06/19   Nicole Kindred A, DO  ipratropium-albuterol (DUONEB) 0.5-2.5 (3) MG/3ML SOLN Take 3 mLs by nebulization every 6 (six) hours as needed (for shortness of breath). 11/14/14   Joanne Gavel, MD  lovastatin (MEVACOR) 40 MG tablet TAKE 1 TABLET BY MOUTH  DAILY IN THE EVENING 06/25/19   Einar Pheasant, MD  metoprolol tartrate (LOPRESSOR) 100 MG tablet Take 1 tablet (100 mg total) by mouth 2 (two) times daily. 08/06/19   Ezekiel Slocumb, DO  predniSONE (DELTASONE) 20 MG tablet Take 2 tablets (40 mg total) by mouth daily with breakfast. 08/07/19   Nicole Kindred A, DO  SYMBICORT 160-4.5 MCG/ACT inhaler USE 2 PUFFS BY MOUTH TWO  TIMES DAILY 06/30/19   Einar Pheasant, MD  torsemide (DEMADEX) 20 MG tablet Take 1 tablet (20 mg total) by mouth daily as needed (swelling or shortness of breath). 08/06/19   Ezekiel Slocumb, DO    Allergies  Allergen Reactions  . Evista [Raloxifene] Other (See Comments)    Night sweats   . Fluticasone-Salmeterol Other (See Comments)    Cough, "chokes me"     Family History  Problem Relation Age of Onset  . Cirrhosis Father        died age 82  . Alcohol abuse Father   . Asthma Mother   . Congestive Heart Failure Mother   . Breast cancer Mother        2 (1/2 sisters)  . Osteoarthritis Mother   . Colon cancer Mother   . Lupus Sister   . Alcohol abuse Sister   . Ovarian cancer Sister   . Osteoporosis Sister   . Skin cancer Sister   . Breast cancer Cousin   . Breast cancer Maternal Aunt     Social History Social History   Tobacco Use  . Smoking status: Former Smoker    Packs/day: 1.00    Years: 45.00    Pack years: 45.00    Types: Cigarettes    Quit date: 11/11/2003    Years since quitting: 15.7  . Smokeless tobacco:  Never Used  Substance Use Topics  . Alcohol use: Not Currently    Alcohol/week: 0.0 standard drinks  . Drug use: No    Review of Systems Constitutional: Negative for fever. Cardiovascular: Negative for chest pain. Respiratory: Positive for shortness of breath, much worse with any exertion. Gastrointestinal: Negative for abdominal pain, vomiting Genitourinary: Negative for urinary compaints Musculoskeletal: Minimal lower extremity and upper extremity edema. Neurological: Negative for headache All other ROS negative  ____________________________________________   PHYSICAL  EXAM:  VITAL SIGNS: ED Triage Vitals  Enc Vitals Group     BP 08/13/19 1227 (!) 187/73     Pulse Rate 08/13/19 1227 65     Resp 08/13/19 1227 (!) 24     Temp 08/13/19 1227 98.4 F (36.9 C)     Temp Source 08/13/19 1227 Oral     SpO2 08/13/19 1227 94 %     Weight 08/13/19 1228 136 lb 11 oz (62 kg)     Height 08/13/19 1228 _0  (1.651 m)     Head Circumference --      Peak Flow --      Pain Score 08/13/19 1228 0     Pain Loc --      Pain Edu? --      Excl. in [place]? --    Constitutional: Alert and oriented. Well appearing and in no distress. Eyes: Normal exam ENT      Head: Normocephalic and atraumatic.      Mouth/Throat: Mucous membranes are moist. Cardiovascular: Normal rate, regular rhythm.  Respiratory: Normal respiratory effort without tachypnea nor retractions. Breath sounds are clear without obvious wheeze rales or rhonchi. Gastrointestinal: Soft and nontender. No distention.  Musculoskeletal: Nontender with normal range of motion in all extremities.  Neurologic:  Normal speech and language. No gross focal neurologic deficits Skin:  Skin is warm, dry and intact.  Psychiatric: Mood and affect are normal.   ____________________________________________    EKG  EKG viewed and interpreted by myself shows a normal sinus rhythm at 65 bpm with a narrow QRS, normal axis, normal intervals, no  concerning ST changes.  ____________________________________________    RADIOLOGY  Chest x-ray shows a moderate left-sided pleural effusion increased from prior exam 08/02/2019.  ____________________________________________   INITIAL IMPRESSION / ASSESSMENT AND PLAN / ED COURSE  Pertinent labs & imaging results that were available during my care of the patient were reviewed by me and considered in my medical decision making (see chart for details).   Patient presents to the emergency department for continued shortness of breath in fact worse than when she was discharged 08/06/2019.  Patient finished her steroids without improvement.  Patient is hypoxic at times in the upper 80s on room air with no baseline O2 requirement.  Placed on 2 L of nasal cannula oxygen with sats in the mid 90s.  Patient remains quite hypertensive currently 208/68.  Took her morning blood pressure medications.  Patient does have mild edema in her upper and lower extremities.  Patient's chest x-ray shows an increase in moderate sized left pleural effusion.  The pleural effusion could very likely be the cause of the patient's continued shortness of breath.  Given the patient's continued intermittent hypoxia with increasing left pleural effusion we will readmit the patient to the hospitalist service for further treatment work-up and possible pleurocentesis.  Patient's labs are largely nonrevealing today.  White blood cell count is elevated 20,000 however she just completed a course of prednisone.  Patient's kidney function appears quite improved from discharge.  Tina [name] was evaluated in Emergency Department on 08/13/2019 for the symptoms described in the history of present illness. She was evaluated in the context of the global COVID-19 pandemic, which necessitated consideration that the patient might be at risk for infection with the SARS-CoV-2 virus that causes COVID-19. Institutional protocols and algorithms that  pertain to the evaluation of patients at risk for COVID-19 are in a state of rapid change based on information released by regulatory  bodies including the CDC and federal and state organizations. These policies and algorithms were followed during the patient's care in the ED.  ____________________________________________   FINAL CLINICAL IMPRESSION(S) / ED DIAGNOSES  Pleural effusion Dyspnea Hypoxia   Harvest Dark, MD 08/13/19 1357

## 2019-08-13 NOTE — ED Notes (Signed)
Report given to Misty, RN

## 2019-08-13 NOTE — ED Notes (Signed)
Pt sat at 96% 2 L.

## 2019-08-13 NOTE — ED Notes (Signed)
Daughter at bedside. Warm blanket provided.

## 2019-08-13 NOTE — ED Notes (Signed)
Meal provided with meal tray.

## 2019-08-13 NOTE — ED Triage Notes (Signed)
Pt comes into the ED with c/o SOB, states she was admitted on 3/2 for 5 days and is not feeling any better. Pt is tachypneic on arrival

## 2019-08-13 NOTE — ED Notes (Signed)
[facility] pharmacy requesting med verification. Pt provided with update regarding emdicaiton and treatment,

## 2019-08-14 ENCOUNTER — Inpatient Hospital Stay: Payer: Medicare Other

## 2019-08-14 LAB — PROTEIN, PLEURAL OR PERITONEAL FLUID: Total protein, fluid: <3 g/dL

## 2019-08-14 LAB — CBC
HCT: 37.1 % (ref 36.0–46.0)
Hemoglobin: 12.7 g/dL (ref 12.0–15.0)
MCH: 30.8 pg (ref 26.0–34.0)
MCHC: 34.2 g/dL (ref 30.0–36.0)
MCV: 89.8 fL (ref 80.0–100.0)
Platelets: 196 10*3/uL (ref 150–400)
RBC: 4.13 MIL/uL (ref 3.87–5.11)
RDW: 13.2 % (ref 11.5–15.5)
WBC: 10.7 10*3/uL — ABNORMAL HIGH (ref 4.0–10.5)
nRBC: 0 % (ref 0.0–0.2)

## 2019-08-14 LAB — BODY FLUID CELL COUNT WITH DIFFERENTIAL
Eos, Fluid: 0 %
Lymphs, Fluid: 24 %
Monocyte-Macrophage-Serous Fluid: 5 %
Neutrophil Count, Fluid: 71 %
Total Nucleated Cell Count, Fluid: 23 cu mm

## 2019-08-14 LAB — BASIC METABOLIC PANEL
Anion gap: 10 (ref 5–15)
BUN: 25 mg/dL — ABNORMAL HIGH (ref 8–23)
CO2: 27 mmol/L (ref 22–32)
Calcium: 9 mg/dL (ref 8.9–10.3)
Chloride: 98 mmol/L (ref 98–111)
Creatinine, Ser: 1.16 mg/dL — ABNORMAL HIGH (ref 0.44–1.00)
GFR calc Af Amer: 51 mL/min — ABNORMAL LOW (ref >60)
GFR calc non Af Amer: 44 mL/min — ABNORMAL LOW (ref >60)
Glucose, Bld: 158 mg/dL — ABNORMAL HIGH (ref 70–99)
Potassium: 3.5 mmol/L (ref 3.5–5.1)
Sodium: 135 mmol/L (ref 135–145)

## 2019-08-14 LAB — LACTATE DEHYDROGENASE, PLEURAL OR PERITONEAL FLUID: LD, Fluid: 90 U/L — ABNORMAL HIGH (ref 3–23)

## 2019-08-14 LAB — MAGNESIUM: Magnesium: 2.4 mg/dL (ref 1.7–2.4)

## 2019-08-14 LAB — SARS CORONAVIRUS 2 (TAT 6-24 HRS): SARS Coronavirus 2: NEGATIVE

## 2019-08-14 LAB — PATHOLOGIST SMEAR REVIEW

## 2019-08-14 MED ORDER — CLOPIDOGREL BISULFATE 75 MG PO TABS
75.0000 mg | ORAL_TABLET | Freq: Every day | ORAL | Status: DC
  Administered 2019-08-15 – 2019-08-22 (×8): 75 mg via ORAL
  Filled 2019-08-14 (×8): qty 1

## 2019-08-14 MED ORDER — ROFLUMILAST 500 MCG PO TABS
250.0000 ug | ORAL_TABLET | Freq: Every day | ORAL | Status: DC
  Administered 2019-08-14 – 2019-08-22 (×8): 250 ug via ORAL
  Filled 2019-08-14 (×9): qty 1

## 2019-08-14 MED ORDER — LABETALOL HCL 200 MG PO TABS
200.0000 mg | ORAL_TABLET | Freq: Two times a day (BID) | ORAL | Status: DC
  Administered 2019-08-14 – 2019-08-22 (×17): 200 mg via ORAL
  Filled 2019-08-14 (×19): qty 1

## 2019-08-14 MED ORDER — METHYLPREDNISOLONE SODIUM SUCC 40 MG IJ SOLR
20.0000 mg | Freq: Every day | INTRAMUSCULAR | Status: DC
  Administered 2019-08-15 – 2019-08-19 (×5): 20 mg via INTRAVENOUS
  Filled 2019-08-14 (×5): qty 1

## 2019-08-14 MED ORDER — ASPIRIN EC 81 MG PO TBEC
81.0000 mg | DELAYED_RELEASE_TABLET | Freq: Every day | ORAL | Status: DC
  Administered 2019-08-15 – 2019-08-22 (×6): 81 mg via ORAL
  Filled 2019-08-14 (×7): qty 1

## 2019-08-14 MED ORDER — FUROSEMIDE 10 MG/ML IJ SOLN
40.0000 mg | Freq: Once | INTRAMUSCULAR | Status: AC
  Administered 2019-08-14: 40 mg via INTRAVENOUS
  Filled 2019-08-14: qty 4

## 2019-08-14 NOTE — Progress Notes (Signed)
PROGRESS NOTE    Tina [name]  VEL:[id number] DOB: 1934/12/25 DOA: 08/13/2019 PCP: Einar Pheasant, MD    Assessment & Plan:   Active Problems:   Dyspnea    Tina [name] a 85 y.o.Caucasian femalewith medical history significant forCOPD,hypertension,diastolic CHF,CAD, CKD 3b whopresented with persistent dyspnea.   # Dyspnea 2/2 COPD exacerbation # Chronic dyspnea --Able to maintain O2 sat, however, has increased RR and visibly uncomfortable.  Lung exams revealed coarse wheezes and rhonchi.  Presented with WBC 20.6, though no fever, or consolidation seen on CXR.  Does have mod left pleural effusion, but doesn't seem large enough to cause this amount of dyspnea.  Was recently hospitalized for COPD exacerbation only about a week ago, and finished 5 days of prednisone 40 mg daily, maybe needs a longer course or taper? --neg D-dimer, procal 0.15 PLAN: --Pulm consult today, recommend below:  - Decrease solumedrol to 20 IV with transition to prednisone 40mg  tomorrow --patient will likely require prolonged steroid taper on d/c from 40 to 5mg  over period of 14days --DuoNeb q4h scheduled --continue azithromycin 500 mg daily   - will perform nocturnal pulxe oximetry to qualify for O2 while asleep   - adding Roflumilast 246mcg po  - will add IS and flutter at bedside   - will add MetaNeb with saline for recruitment of LLL atelectasis -PT/OT -outpatient pulmonary rehab  Hx of Pulmonary embolism - finished 6 months of anticoagulation  -patient may need to be evaluated for chronic thromboembolic disease as this does cause signficinant dyspnea  Chronic diastolic dysfunction CHF  --Last TTE on 08/03/19 showed LVEF 50-55% with grade I diastolic dysfunction. Pt was discharged with PRN torsemide due to AKI.  CXR on presentation didn't show pulm edema, however, did have mod pleural effusion. --BNP 1107 PLAN: --IV lasix 40 mg x1 today --Strict I/O --Low sodium diet  Mod left  pleural effusion --increased since 08/02/19.  Maybe contributing to dyspnea. PLAN: --Thoracentesis today with 300 cc removed, transudate --diuresis  CKD 3b --Cr 1.09 on presentation, which is baseline.  Uncontrolled HTN --was discharged on increase hydralazine to100mg  TID, metop to100mg  BID and home amlodipine (home losartan held due to AKI), and noted to have elevated BP at home. PLAN: --continue home amlodipine, hydralazine, and metop --IV lasix 40 mg x1 --IV hydralazine PRN  Historycoronary artery disease Continue statin Resume aspirin, Plavixafter thoracentesis  Peripheral neuropathy and gait dysfunction --continue home gabapentin   DVT prophylaxis: Lovenox SQ Code Status: DNR  Family Communication: daughter updated at bedside today Disposition Plan: Home with HH, in 2-3 days   Subjective and Interval History:  Pt reported feeling and breathing better, able to breath while eating and talking all at the same time.  Good urine output with diuretic.  No fever, chest pain, abdominal pain, N/V/D, dysuria.   Objective: Vitals:   08/14/19 1126 08/14/19 1205 08/14/19 1452 08/14/19 1617  BP:  (!) 180/57 (!) 148/58   Pulse:  70 69   Resp:  18 20   Temp:  (!) 97.5 F (36.4 C)    TempSrc:  Oral    SpO2: 93% 93% 93% 94%  Weight:      Height:        Intake/Output Summary (Last 24 hours) at 08/14/2019 1924 Last data filed at 08/14/2019 1837 Gross per 24 hour  Intake 480 ml  Output 800 ml  Net -320 ml   Filed Weights   08/13/19 1228  Weight: 62 kg    Examination:  Constitutional: NAD, AAOx3 HEENT: conjunctivae and lids normal, EOMI CV: RRR no M,R,G. Distal pulses +2.  No cyanosis.   RESP: lungs sound more clear today, normal work of breathing, on 1.5L GI: +BS, NTND Extremities: Mild non-pitting edema in BLE SKIN: warm, dry, extensive bruising on both lower legs Neuro: II - XII grossly intact.  Sensation intact Psych: Better mood and affect today.   Appropriate judgement and reason   Data Reviewed: I have personally reviewed following labs and imaging studies  CBC: Recent Labs  Lab 08/13/19 1234 08/14/19 0544  WBC 20.6* 10.7*  NEUTROABS 16.5*  --   HGB 13.7 12.7  HCT 40.5 37.1  MCV 91.0 89.8  PLT 217 767   Basic Metabolic Panel: Recent Labs  Lab 08/13/19 1234 08/14/19 0544  NA 136 135  K 3.7 3.5  CL 98 98  CO2 27 27  GLUCOSE 196* 158*  BUN 24* 25*  CREATININE 1.09* 1.16*  CALCIUM 9.4 9.0  MG  --  2.4   GFR: Estimated Creatinine Clearance: 33.6 mL/min (A) (by C-G formula based on SCr of 1.16 mg/dL (H)). Liver Function Tests: No results for input(s): AST, ALT, ALKPHOS, BILITOT, PROT, ALBUMIN in the last 168 hours. No results for input(s): LIPASE, AMYLASE in the last 168 hours. No results for input(s): AMMONIA in the last 168 hours. Coagulation Profile: No results for input(s): INR, PROTIME in the last 168 hours. Cardiac Enzymes: No results for input(s): CKTOTAL, CKMB, CKMBINDEX, TROPONINI in the last 168 hours. BNP (last 3 results) No results for input(s): PROBNP in the last 8760 hours. HbA1C: No results for input(s): HGBA1C in the last 72 hours. CBG: No results for input(s): GLUCAP in the last 168 hours. Lipid Profile: No results for input(s): CHOL, HDL, LDLCALC, TRIG, CHOLHDL, LDLDIRECT in the last 72 hours. Thyroid Function Tests: No results for input(s): TSH, T4TOTAL, FREET4, T3FREE, THYROIDAB in the last 72 hours. Anemia Panel: No results for input(s): VITAMINB12, FOLATE, FERRITIN, TIBC, IRON, RETICCTPCT in the last 72 hours. Sepsis Labs: Recent Labs  Lab 08/13/19 1949  PROCALCITON 0.15    Recent Results (from the past 240 hour(s))  SARS CORONAVIRUS 2 (TAT 6-24 HRS) Nasopharyngeal Nasopharyngeal Swab     Status: None   Collection Time: 08/13/19  2:46 PM   Specimen: Nasopharyngeal Swab  Result Value Ref Range Status   SARS Coronavirus 2 NEGATIVE NEGATIVE Final    Comment: (NOTE) SARS-CoV-2  target nucleic acids are NOT DETECTED. The SARS-CoV-2 RNA is generally detectable in upper and lower respiratory specimens during the acute phase of infection. Negative results do not preclude SARS-CoV-2 infection, do not rule out co-infections with other pathogens, and should not be used as the sole basis for treatment or other patient management decisions. Negative results must be combined with clinical observations, patient history, and epidemiological information. The expected result is Negative. Fact Sheet for Patients: SugarRoll.be Fact Sheet for Healthcare Providers: https://www.woods-mathews.com/ This test is not yet approved or cleared by the [place] FDA and  has been authorized for detection and/or diagnosis of SARS-CoV-2 by FDA under an Emergency Use Authorization (EUA). This EUA will remain  in effect (meaning this test can be used) for the duration of the COVID-19 declaration under Section 56 4(b)(1) of the Act, 21 U.S.C. section 360bbb-3(b)(1), unless the authorization is terminated or revoked sooner. Performed at [facility], [place] [address], [place], [place] [address]       Radiology Studies: DG Chest 2 View  Result Date: 08/13/2019  CLINICAL DATA:  Shortness of breath EXAM: CHEST - 2 VIEW COMPARISON:  08/02/2019 FINDINGS: The heart size and mediastinal contours are stable. Atherosclerotic calcification of the aortic knob. Moderate left pleural effusion, increased from prior. Hyper expanded lungs with chronically coarsened interstitial markings. No pneumothorax. Exaggerated thoracic kyphosis. IMPRESSION: 1. Moderate left pleural effusion, increased from prior. 2. COPD. Electronically Signed   By: Davina Poke D.O.   On: 08/13/2019 13:16   DG Chest Port 1 View  Result Date: 08/14/2019 CLINICAL DATA:  85 year old female status post thoracentesis EXAM: PORTABLE CHEST 1 VIEW COMPARISON:  08/13/2019 FINDINGS:  Cardiomediastinal silhouette unchanged in size and contour. Evidence of prior PTCA. Mild interlobular septal thickening, seems increased from the prior. Coarsened interstitial markings persist from the prior. Decreased opacity at the left lung base with persistent blunting at the left costophrenic angle. No pneumothorax. Minimal blunting at the right costophrenic angle. IMPRESSION: Improved left-sided pleural effusion status post thoracentesis. No pneumothorax. New trace right-sided pleural fluid. Early pulmonary edema. Electronically Signed   By: Corrie Mckusick D.O.   On: 08/14/2019 15:34   US THORACENTESIS ASP PLEURAL SPACE W/IMG GUIDE  Result Date: 08/14/2019 INDICATION: 85 year old female with a history of left-sided pleural effusion EXAM: ULTRASOUND GUIDED LEFT THORACENTESIS MEDICATIONS: None. COMPLICATIONS: None PROCEDURE: An ultrasound guided thoracentesis was thoroughly discussed with the patient and questions answered. The benefits, risks, alternatives and complications were also discussed. The patient understands and wishes to proceed with the procedure. Written consent was obtained. Ultrasound was performed to localize and mark an adequate pocket of fluid in the left chest. The area was then prepped and draped in the normal sterile fashion. 1% Lidocaine was used for local anesthesia. Under ultrasound guidance a 8 Fr Safe-T-Centesis catheter was introduced. Thoracentesis was performed. The catheter was removed and a dressing applied. FINDINGS: A total of approximately 300 cc of thin yellow fluid was removed. The blood-tinged fluid is present secondary to contamination from the initial drain placement. Samples were sent to the laboratory as requested by the clinical team. IMPRESSION: Status post ultrasound-guided left-sided thoracentesis Signed, Dulcy Fanny. Dellia Nims, RPVI [facility] Electronically Signed   By: Corrie Mckusick D.O.   On: 08/14/2019  15:31     Scheduled Meds: . amLODipine  10 mg Oral Daily  . azithromycin  500 mg Oral Daily  . enoxaparin (LOVENOX) injection  40 mg Subcutaneous Q24H  . gabapentin  200 mg Oral QHS  . hydrALAZINE  100 mg Oral TID  . ipratropium-albuterol  3 mL Nebulization Q4H  . labetalol  200 mg Oral BID  . [START ON 08/15/2019] methylPREDNISolone (SOLU-MEDROL) injection  20 mg Intravenous Daily  . pravastatin  40 mg Oral q1800  . roflumilast  250 mcg Oral Daily   Continuous Infusions:   LOS: 1 day     Enzo Bi, MD [facility] If 7PM-7AM, please contact night-coverage 08/14/2019, 7:24 PM

## 2019-08-14 NOTE — Consult Note (Signed)
Pulmonary Medicine          Date: 08/14/2019,   MRN# [id number] Tina [name] November 09, 1934     AdmissionWeight: 62 kg                 CurrentWeight: 62 kg Referring physician: Dr Billie Ruddy      CHIEF COMPLAINT:   Severe acute exacerbation of COPD with re-admission.    HISTORY OF PRESENT ILLNESS   Tina Corriher Viersis a 56 Fwith medical history significant for advanced centrilobular emphysema and COPD, HTN HFpEF, CAD CKD3 who came in with worsening dyspnea. She was recenlty discharged on March 24th 2021 with AECOPD with short term improvement followed by recurrence of symptoms post tapering gluccocorticoids. Upon discharge patient reported improvement with ability to ambulate on room air without needing supplemental O2.  Shortly after d/c home patient noted she needed to take frequent breaks after moments of activity. Further she shares requirement of very frequent use of inhalers post d/c home due to worsening dyspnea and labored breathing.  In admission patient noted to be tachypneic with hypoxemia on mild exertion with leukocytosis over 20,000 and a chest x-ray that showed a left moderate-sized pleural effusion as well as coarse airway markings suggestive of COPD.  Pulmonary consultation placed for further evaluation management of recurrent acute exacerbation of COPD with readmission.  PAST MEDICAL HISTORY   Past Medical History:  Diagnosis Date  . Anemia   . Asthma   . Bladder cancer ([place])   . BRCA positive   . CAD (coronary artery disease)   . CHF (congestive heart failure) ([place])   . COPD (chronic obstructive pulmonary disease) ([place])   . Emphysema lung ([place])   . GERD (gastroesophageal reflux disease)   . History of colon polyps   . Hypercholesterolemia   . Hyperglycemia   . Hypertension   . Lung nodules   . Osteoporosis   . Personal history of tobacco use, presenting hazards to health 11/25/2014  . Polycythemia vera(238.4)   . Renal cyst   . Skin cancer       SURGICAL HISTORY   Past Surgical History:  Procedure Laterality Date  . ANGIOPLASTY     coronary (x1)  . COLONOSCOPY WITH PROPOFOL N/A 03/02/2015   Procedure: COLONOSCOPY WITH PROPOFOL;  Surgeon: Lollie Sails, MD;  Location: [facility] ENDOSCOPY;  Service: Endoscopy;  Laterality: N/A;  . COLONOSCOPY WITH PROPOFOL N/A 02/13/2017   Procedure: COLONOSCOPY WITH PROPOFOL;  Surgeon: [name], MD;  Location: [facility] ENDOSCOPY;  Service: Endoscopy;  Laterality: N/A;  . CORONARY ANGIOPLASTY    . CORONARY ARTERY BYPASS GRAFT  09/17/2017   pt denies  . CYSTOSCOPY W/ RETROGRADES Bilateral 02/13/2017   Procedure: CYSTOSCOPY WITH RETROGRADE PYELOGRAM;  Surgeon: Hollice Espy, MD;  Location: [facility] ORS;  Service: Urology;  Laterality: Bilateral;  . CYSTOSCOPY W/ RETROGRADES Bilateral 09/19/2017   Procedure: CYSTOSCOPY WITH RETROGRADE PYELOGRAM;  Surgeon: Hollice Espy, MD;  Location: [facility] ORS;  Service: Urology;  Laterality: Bilateral;  . CYSTOSCOPY WITH BIOPSY  03/26/2017   Procedure: CYSTOSCOPY WITH BIOPSY;  Surgeon: Hollice Espy, MD;  Location: [facility] ORS;  Service: Urology;;  . Consuela Mimes WITH STENT PLACEMENT Bilateral 02/13/2017   Procedure: CYSTOSCOPY WITH STENT PLACEMENT;  Surgeon: Hollice Espy, MD;  Location: [facility] ORS;  Service: Urology;  Laterality: Bilateral;  . CYSTOSCOPY WITH STENT PLACEMENT Bilateral 03/26/2017   Procedure: CYSTOSCOPY WITH STENT EXCHANGE;  Surgeon: Hollice Espy, MD;  Location: [facility] ORS;  Service: Urology;  Laterality: Bilateral;  .  ESOPHAGOGASTRODUODENOSCOPY (EGD) WITH PROPOFOL N/A 02/13/2017   Procedure: ESOPHAGOGASTRODUODENOSCOPY (EGD) WITH PROPOFOL;  Surgeon: [name], MD;  Location: [facility] ENDOSCOPY;  Service: Endoscopy;  Laterality: N/A;  . PORTA CATH INSERTION N/A 02/28/2017   Procedure: PORTA CATH INSERTION;  Surgeon: Algernon Huxley, MD;  Location: [place] CV LAB;  Service: Cardiovascular;  Laterality: N/A;  . PORTA CATH REMOVAL N/A 12/24/2017    Procedure: PORTA CATH REMOVAL;  Surgeon: Algernon Huxley, MD;  Location: [place] CV LAB;  Service: Cardiovascular;  Laterality: N/A;  . SALPINGOOPHORECTOMY    . TONSILECTOMY/ADENOIDECTOMY WITH MYRINGOTOMY    . TONSILLECTOMY    . TRANSURETHRAL RESECTION OF BLADDER TUMOR N/A 02/13/2017   Procedure: TRANSURETHRAL RESECTION OF BLADDER TUMOR (TURBT);  Surgeon: Hollice Espy, MD;  Location: [facility] ORS;  Service: Urology;  Laterality: N/A;  . TUBAL LIGATION    . UPPER GI ENDOSCOPY  02/13/2017  . URETEROSCOPY Left 09/19/2017   Procedure: URETEROSCOPY;  Surgeon: Hollice Espy, MD;  Location: [facility] ORS;  Service: Urology;  Laterality: Left;  Marland Kitchen VISCERAL ANGIOGRAPHY N/A 01/23/2019   Procedure: VISCERAL ANGIOGRAPHY;  Surgeon: Algernon Huxley, MD;  Location: [place] CV LAB;  Service: Cardiovascular;  Laterality: N/A;  . VISCERAL ANGIOGRAPHY N/A 06/09/2019   Procedure: VISCERAL ANGIOGRAPHY;  Surgeon: Algernon Huxley, MD;  Location: [place] [facility] LAB;  Service: Cardiovascular;  Laterality: N/A;  . VISCERAL ARTERY INTERVENTION N/A 02/12/2017   Procedure: VISCERAL ARTERY INTERVENTION;  Surgeon: Algernon Huxley, MD;  Location: [place] CV LAB;  Service: Cardiovascular;  Laterality: N/A;     FAMILY HISTORY   Family History  Problem Relation Age of Onset  . Cirrhosis Father        died age 44  . Alcohol abuse Father   . Asthma Mother   . Congestive Heart Failure Mother   . Breast cancer Mother        2 (1/2 sisters)  . Osteoarthritis Mother   . Colon cancer Mother   . Lupus Sister   . Alcohol abuse Sister   . Ovarian cancer Sister   . Osteoporosis Sister   . Skin cancer Sister   . Breast cancer Cousin   . Breast cancer Maternal Aunt      SOCIAL HISTORY   Social History   Tobacco Use  . Smoking status: Former Smoker    Packs/day: 1.00    Years: 45.00    Pack years: 45.00    Types: Cigarettes    Quit date: 11/11/2003    Years since quitting: 15.7  . Smokeless tobacco: Never Used   Substance Use Topics  . Alcohol use: Not Currently    Alcohol/week: 0.0 standard drinks  . Drug use: No     MEDICATIONS    Home Medication:    Current Medication:  Current Facility-Administered Medications:  .  acetaminophen (TYLENOL) tablet 1,000 mg, 1,000 mg, Oral, Q8H PRN, Enzo Bi, MD .  alum & mag hydroxide-simeth (MAALOX/MYLANTA) 200-200-20 MG/5ML suspension 15 mL, 15 mL, Oral, Q6H PRN, Enzo Bi, MD .  amLODipine (NORVASC) tablet 10 mg, 10 mg, Oral, Daily, Enzo Bi, MD, 10 mg at 08/14/19 0805 .  azithromycin [facility]) tablet 500 mg, 500 mg, Oral, Daily, Enzo Bi, MD, 500 mg at 08/14/19 0805 .  calcium carbonate (TUMS - dosed in mg elemental calcium) chewable tablet 200 mg of elemental calcium, 1 tablet, Oral, TID PRN, Enzo Bi, MD .  docusate sodium (COLACE) capsule 100 mg, 100 mg, Oral, BID PRN, Enzo Bi, MD .  enoxaparin (LOVENOX) injection 40 mg, 40 mg, Subcutaneous, Q24H, Enzo Bi, MD, 40 mg at 08/13/19 2001 .  furosemide (LASIX) injection 40 mg, 40 mg, Intravenous, Once, Enzo Bi, MD .  gabapentin (NEURONTIN) capsule 200 mg, 200 mg, Oral, QHS, Enzo Bi, MD, 200 mg at 08/13/19 2245 .  guaiFENesin-dextromethorphan (ROBITUSSIN DM) 100-10 MG/5ML syrup 10 mL, 10 mL, Oral, Q6H PRN, Enzo Bi, MD .  hydrALAZINE (APRESOLINE) injection 10 mg, 10 mg, Intravenous, Q6H PRN, Enzo Bi, MD, 10 mg at 08/14/19 0175 .  hydrALAZINE (APRESOLINE) tablet 100 mg, 100 mg, Oral, TID, Enzo Bi, MD, 100 mg at 08/14/19 0805 .  ipratropium-albuterol (DUONEB) 0.5-2.5 (3) MG/3ML nebulizer solution 3 mL, 3 mL, Nebulization, Q4H, Enzo Bi, MD, 3 mL at 08/14/19 0748 .  labetalol (NORMODYNE) tablet 200 mg, 200 mg, Oral, BID, Enzo Bi, MD .  methylPREDNISolone sodium succinate (SOLU-MEDROL) 40 mg/mL injection 40 mg, 40 mg, Intravenous, Daily, Enzo Bi, MD, 40 mg at 08/14/19 0806 .  ondansetron (ZOFRAN) injection 4 mg, 4 mg, Intravenous, Q6H PRN, Enzo Bi, MD .  ondansetron (ZOFRAN-ODT)  disintegrating tablet 4 mg, 4 mg, Oral, Q8H PRN, Enzo Bi, MD .  polyethylene glycol (MIRALAX / GLYCOLAX) packet 17 g, 17 g, Oral, BID PRN, Enzo Bi, MD .  pravastatin (PRAVACHOL) tablet 40 mg, 40 mg, Oral, q1800, Enzo Bi, MD, 40 mg at 08/13/19 2000  Facility-Administered Medications Ordered in Other Encounters:  .  heparin lock flush 100 unit/mL, 500 Units, Intravenous, Once, Sindy Guadeloupe, MD .  sodium chloride flush (NS) 0.9 % injection 10 mL, 10 mL, Intravenous, PRN, Sindy Guadeloupe, MD    ALLERGIES   Evista [raloxifene] and Fluticasone-salmeterol     REVIEW OF SYSTEMS    Review of Systems:  Gen:  Denies  fever, sweats, chills weigh loss  HEENT: Denies blurred vision, double vision, ear pain, eye pain, hearing loss, nose bleeds, sore throat Cardiac:  No dizziness, chest pain or heaviness, chest tightness,edema Resp:   Denies cough or sputum porduction, shortness of breath,wheezing, hemoptysis,  Gi: Denies swallowing difficulty, stomach pain, nausea or vomiting, diarrhea, constipation, bowel incontinence Gu:  Denies bladder incontinence, burning urine Ext:   Denies Joint pain, stiffness or swelling Skin: Denies  skin rash, easy bruising or bleeding or hives Endoc:  Denies polyuria, polydipsia , polyphagia or weight change Psych:   Denies depression, insomnia or hallucinations   Other:  All other systems negative   VS: BP (!) 174/68   Pulse 66   Temp 98.4 F (36.9 C) (Oral)   Resp 18   Ht '5\' 5"'  (1.651 m)   Wt 62 kg   SpO2 95%   BMI 22.75 kg/m      PHYSICAL EXAM    GENERAL:NAD, no fevers, chills, no weakness no fatigue HEAD: Normocephalic, atraumatic.  EYES: Pupils equal, round, reactive to light. Extraocular muscles intact. No scleral icterus.  MOUTH: Moist mucosal membrane. Dentition intact. No abscess noted.  EAR, NOSE, THROAT: Clear without exudates. No external lesions.  NECK: Supple. No thyromegaly. No nodules. No JVD.  PULMONARY: mild rhonchi  bilaterally CARDIOVASCULAR: S1 and S2. Regular rate and rhythm. No murmurs, rubs, or gallops. No edema. Pedal pulses 2+ bilaterally.  GASTROINTESTINAL: Soft, nontender, nondistended. No masses. Positive bowel sounds. No hepatosplenomegaly.  MUSCULOSKELETAL: No swelling, clubbing, or edema. Range of motion full in all extremities.  NEUROLOGIC: Cranial nerves II through XII are intact. No gross focal neurological deficits. Sensation intact. Reflexes intact.  SKIN: No ulceration, lesions, rashes, or  cyanosis. Skin warm and dry. Turgor intact.  PSYCHIATRIC: Mood, affect within normal limits. The patient is awake, alert and oriented x 3. Insight, judgment intact.       IMAGING    DG Chest 2 View  Result Date: 08/13/2019 CLINICAL DATA:  Shortness of breath EXAM: CHEST - 2 VIEW COMPARISON:  08/02/2019 FINDINGS: The heart size and mediastinal contours are stable. Atherosclerotic calcification of the aortic knob. Moderate left pleural effusion, increased from prior. Hyper expanded lungs with chronically coarsened interstitial markings. No pneumothorax. Exaggerated thoracic kyphosis. IMPRESSION: 1. Moderate left pleural effusion, increased from prior. 2. COPD. Electronically Signed   By: Davina Poke D.O.   On: 08/13/2019 13:16   DG Chest 2 View  Result Date: 08/02/2019 CLINICAL DATA:  increasing shortness of breath and weakness over last 2-3 weeks. PT states she in unable to climb her stairs at home as normal. States she has noted a drop in exertional O2 sats to low 90s. EXAM: CHEST - 2 VIEW COMPARISON:  Chest radiograph 10/20/2017 FINDINGS: The heart size and mediastinal contours are within normal limits. Aortic arch calcification. Lungs are hyperinflated chronic bilateral bronchial wall thickening. There is a small left pleural effusion and mild bibasilar opacities which could reflect scarring or atelectasis. No pneumothorax. No acute finding in the visualized skeleton. Both lungs are clear. The  visualized skeletal structures are unremarkable. IMPRESSION: Small left pleural effusion and mild bibasilar opacities favored to represent scarring or atelectasis. COPD and chronic bronchitic change. Electronically Signed   By: Audie Pinto M.D.   On: 08/02/2019 13:53   US RENAL  Result Date: 08/05/2019 CLINICAL DATA:  Acute kidney injury. EXAM: RENAL / URINARY TRACT ULTRASOUND COMPLETE COMPARISON:  04/22/2019 FINDINGS: Right Kidney: Renal measurements: 9.6 x 3.6 x 4.1 cm = volume: 74.4 mL. No hydronephrosis. Echogenicity normal. There is a upper pole renal sinus cyst measuring 2 x 1.9 x 1.7 cm. Left Kidney: Renal measurements: 9.3 x 3.9 x 4.1 cm = volume: 77.0 mL. Large exophytic cyst arising from the lateral cortex of the left mid kidney measures 6.6 x 5.5 x 6.0 cm. No hydronephrosis. Normal echogenicity. Bladder: Appears normal for degree of bladder distention. Other: None. IMPRESSION: 1. No acute findings.  No hydronephrosis. 2. Bilateral kidney cysts. Electronically Signed   By: Kerby Moors M.D.   On: 08/05/2019 11:09   ECHOCARDIOGRAM COMPLETE  Result Date: 08/03/2019    ECHOCARDIOGRAM REPORT   Patient Name:   BERANIA PEEDIN Date of Exam: 08/03/2019 Medical Rec #:  [id number]     Height:       65.0 in Accession #:    [id number]    Weight:       135.1 lb Date of Birth:  August 05, 1934     BSA:          1.674 m Patient Age:    17 years      BP:           183/76 mmHg Patient Gender: F             HR:           81 bpm. Exam Location:  [facility] Procedure: 2D Echo Indications:     CHF 428.31/ I50.31  History:         Patient has prior history of Echocardiogram examinations, most                  recent 10/20/2017.  Sonographer:     [place] Referring  Phys:  [id number] NTIRWERX AGBATA Diagnosing Phys: Yolonda Kida MD IMPRESSIONS  1. Left ventricular ejection fraction, by estimation, is 50 to 55%. The left ventricle has low normal function. The left ventricle demonstrates global hypokinesis. Left  ventricular diastolic parameters are consistent with Grade I diastolic dysfunction (impaired relaxation).  2. Right ventricular systolic function is normal. The right ventricular size is normal.  3. The mitral valve is grossly normal. Trivial mitral valve regurgitation.  4. The aortic valve is grossly normal. Aortic valve regurgitation is not visualized. FINDINGS  Left Ventricle: Left ventricular ejection fraction, by estimation, is 50 to 55%. The left ventricle has low normal function. The left ventricle demonstrates global hypokinesis. The left ventricular internal cavity size was normal in size. There is no left ventricular hypertrophy. Left ventricular diastolic parameters are consistent with Grade I diastolic dysfunction (impaired relaxation). Right Ventricle: The right ventricular size is normal. No increase in right ventricular wall thickness. Right ventricular systolic function is normal. Left Atrium: Left atrial size was normal in size. Right Atrium: Right atrial size was normal in size. Pericardium: There is no evidence of pericardial effusion. Mitral Valve: The mitral valve is grossly normal. Trivial mitral valve regurgitation. Tricuspid Valve: The tricuspid valve is normal in structure. Tricuspid valve regurgitation is mild. Aortic Valve: The aortic valve is grossly normal. Aortic valve regurgitation is not visualized. Aortic valve peak gradient measures 7.0 mmHg. Pulmonic Valve: The pulmonic valve was normal in structure. Pulmonic valve regurgitation is mild. Aorta: The aortic root is normal in size and structure. IAS/Shunts: The atrial septum is grossly normal.  LEFT VENTRICLE PLAX 2D LVIDd:         4.96 cm     Diastology LVIDs:         3.99 cm     LV e' lateral:   5.87 cm/s LV PW:         1.03 cm     LV E/e' lateral: 15.3 LV IVS:        0.91 cm     LV e' medial:    5.87 cm/s LVOT diam:     1.70 cm     LV E/e' medial:  15.3 LV SV:         49 LV SV Index:   29 LVOT Area:     2.27 cm  LV Volumes (MOD)  LV vol d, MOD A4C: 44.2 ml LV vol s, MOD A4C: 19.3 ml LV SV MOD A4C:     44.2 ml RIGHT VENTRICLE RV Basal diam:  1.90 cm RV S prime:     14.90 cm/s TAPSE (M-mode): 2.5 cm LEFT ATRIUM             Index       RIGHT ATRIUM           Index LA diam:        3.70 cm 2.21 cm/m  RA Area:     11.10 cm LA Vol (A2C):   31.0 ml 18.51 ml/m RA Volume:   22.20 ml  13.26 ml/m LA Vol (A4C):   14.9 ml 8.90 ml/m LA Biplane Vol: 22.9 ml 13.68 ml/m  AORTIC VALVE                PULMONIC VALVE AV Area (Vmax): 1.51 cm    PV Vmax:       1.06 m/s AV Vmax:        132.00 cm/s PV Peak grad:  4.5 mmHg AV Peak Grad:  7.0 mmHg LVOT Vmax:      87.90 cm/s LVOT Vmean:     52.800 cm/s LVOT VTI:       0.215 m  AORTA Ao Root diam: 2.60 cm MITRAL VALVE               TRICUSPID VALVE MV Area (PHT): 3.27 cm    TV Peak grad:   29.2 mmHg MV Decel Time: 232 msec    TV Vmax:        2.70 m/s MV E velocity: 89.70 cm/s MV A velocity: 96.00 cm/s  SHUNTS MV E/A ratio:  0.93        Systemic VTI:  0.22 m                            Systemic Diam: 1.70 cm Dwayne D Callwood MD Electronically signed by Yolonda Kida MD Signature Date/Time: 08/03/2019/3:38:16 PM    Final       ASSESSMENT/PLAN   Severe acute exacerbation of COPD with recurrence and re-admission   - patient will likely require prolonged steroid taper on d/c from 40 to 57m over period of 14days   - will perform nocturnal pulxe oximetry to qualify for O2 while asleep   - adding Roflumilast 2598m po daily   - Decrease solumedrol to 20 IV with transition to prednisone [id number]   -agree with duoneb, mucomyst, zithromax   - will add IS and flutter at bedside   - will add MetaNeb with saline for recruitment of LLL atelectasis -PT/OT -outpatient pulmonary rehab  Hx of Pulmonary embolism - finished 6 months of anticoagulation  -patient may need to be evaluated for chronic thromboembolic disease as this does cause signficinant dyspnea   Left moderate pleural effusion   -  patient with neutrophil predominant transudate    -patient with HFpEF and BNP >1000 which points towards cardiac etiology, would recommend outpatient cardiac evaluation as this is hindering respiratory status   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   Patient/Family are satisfied with care plan and all questions have been answered.  This document was prepared using Dragon voice recognition software and may include unintentional dictation errors.     FuaOttie Glazier.D.  Division of [facility]

## 2019-08-14 NOTE — TOC Initial Note (Signed)
Transition of Care [facility]) - Initial/Assessment Note    Patient Details  Name: Tina [name] MRN: [id number] Date of Birth: [birth year]  Transition of Care [facility]) CM/SW Contact:    Candie Chroman, LCSW Phone Number: 08/14/2019, 9:53 AM  Clinical Narrative: Readmission prevention screen complete. CSW met with patient. No supports at bedside. CSW introduced role and explained that discharge planning would be discussed. Patient's son lives with her. She still drives. PCP is Dr. Einar Pheasant at [facility] on [address]. She has her medications mailed to her and makes sure to order them ahead of time since it takes 8 days for them to be delivered. When prescriptions needed sooner, she uses [facility]. No home health prior to admission. She has a walker, wheelchair, and bedside commode at home. Patient was not on oxygen prior to admission. She did not qualify for oxygen when she discharged last Wednesday and believes she will not qualify now because her sats do not go down to 88%. Will continue to follow for this need at discharge. No further concerns. CSW encouraged patient to contact CSW as needed. CSW will continue to follow patient for support and facilitate return home at discharge.              Expected Discharge Plan: Home/Self Care Barriers to Discharge: Continued Medical Work up   Patient Goals and CMS Choice     Choice offered to / list presented to : NA  Expected Discharge Plan and Services Expected Discharge Plan: Home/Self Care       Living arrangements for the past 2 months: Single Family Home                                      Prior Living Arrangements/Services Living arrangements for the past 2 months: Single Family Home Lives with:: Adult Children Patient language and need for interpreter reviewed:: Yes Do you feel safe going back to the place where you live?: Yes      Need for Family Participation in Patient Care: Yes (Comment) Care giver support  system in place?: Yes (comment) Current home services: DME Criminal Activity/Legal Involvement Pertinent to Current Situation/Hospitalization: No - Comment as needed  Activities of Daily Living Home Assistive Devices/Equipment: Gilford Rile (specify type) ADL Screening (condition at time of admission) Patient's cognitive ability adequate to safely complete daily activities?: Yes Is the patient deaf or have difficulty hearing?: No Does the patient have difficulty seeing, even when wearing glasses/contacts?: No Does the patient have difficulty concentrating, remembering, or making decisions?: No Patient able to express need for assistance with ADLs?: Yes Does the patient have difficulty dressing or bathing?: No Independently performs ADLs?: Yes (appropriate for developmental age) Does the patient have difficulty walking or climbing stairs?: No Weakness of Legs: Both Weakness of Arms/Hands: None  Permission Sought/Granted                  Emotional Assessment Appearance:: Appears stated age Attitude/Demeanor/Rapport: Engaged, Gracious Affect (typically observed): Accepting, Appropriate, Calm, Pleasant Orientation: : Oriented to Self, Oriented to Place, Oriented to  Time, Oriented to Situation Alcohol / Substance Use: Not Applicable Psych Involvement: No (comment)  Admission diagnosis:  Dyspnea [R06.00] Pleural effusion [J90] Dyspnea, unspecified type [R06.00] Patient Active Problem List   Diagnosis Date Noted  . Dyspnea 08/13/2019  . COPD with acute exacerbation ([place]) 08/02/2019  . Acute on chronic diastolic CHF (congestive heart  failure) ([place]) 08/02/2019  . Abnormality of gait 08/02/2019  . Renal artery stenosis ([place]) 07/01/2019  . DOE (dyspnea on exertion) 05/25/2019  . Diarrhea 05/10/2019  . AKI (acute kidney injury) ([place])   . Hypokalemia   . Foot drop 04/02/2019  . Low back pain 03/16/2019  . Leg pain 03/16/2019  . Aortic atherosclerosis ([place]) 01/07/2019  . Hoarseness  06/23/2018  . Unsteadiness 05/11/2018  . CHF (congestive heart failure) ([place]) 10/20/2017  . Pulmonary embolus ([place]) 06/28/2017  . 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 Goals of care, counseling/discussion 03/02/2017  . Abnormal computed tomography of gastrointestinal tract   . Noninfectious diarrhea   . Ulceration of intestine   . Hernia, hiatal   . Bladder mass   . Mesenteric ischemia due to arterial insufficiency ([place])   . Polycythemia vera ([place]) 10/21/2016  . Elevated troponin 06/14/2016  . Leukocytosis 06/14/2016  . Abdominal bruit 04/16/2016  . Abnormal mammogram 06/06/2015  . Essential hypertension 02/16/2015  . Numbness 01/24/2015  . Personal history of tobacco use, presenting hazards to health 11/25/2014  . BRCA positive   . COPD exacerbation ([place]) 11/14/2014  . Sensation of pressure in bladder area 08/15/2014  . Health care maintenance 08/15/2014  . Presence of coronary angioplasty implant and graft 09/30/2013  . History of colonic polyps 08/25/2012  . Female genuine stress incontinence 06/28/2012  . Infection of urinary tract 06/28/2012  . CAD (coronary artery disease) 05/23/2012  . COPD (chronic obstructive pulmonary disease) ([place]) 05/23/2012  . Hypercholesterolemia 05/23/2012  . Hyperglycemia 05/23/2012  . Polycythemia, secondary 05/23/2012  . Renal cyst 05/23/2012  . Osteoporosis 05/23/2012   PCP:  Einar Pheasant, MD Pharmacy:   [facility] [place] [address] [place] [address] Phone: [phone] Fax: [facility], [place], [place] [address] [place] [address] Phone: [phone] Fax: [place], [place] - [address] [place] [address] Phone: [phone] Fax: [phone]     Social Determinants of Health ([place]) Interventions    Readmission Risk  Interventions Readmission Risk Prevention Plan 08/14/2019  Transportation Screening Complete  PCP or Specialist Appt within 3-5 Days Complete  HRI or [place] (No Data)  Social Work Consult for [place] Planning/Counseling [place] Not Applicable  Medication Review Press photographer) Complete  Some recent data might be hidden

## 2019-08-14 NOTE — Procedures (Signed)
Interventional Radiology Procedure Note  Procedure: Left thoracentesis, US guided.  ~300cc of fluid removed.  Complications: None Recommendations:  - CXR now - routine wound care - Do not submerge for 7 days - Routine care   Signed,  Dulcy Fanny. Earleen Newport, DO

## 2019-08-15 LAB — BASIC METABOLIC PANEL
Anion gap: 11 (ref 5–15)
BUN: 42 mg/dL — ABNORMAL HIGH (ref 8–23)
CO2: 27 mmol/L (ref 22–32)
Calcium: 8.9 mg/dL (ref 8.9–10.3)
Chloride: 96 mmol/L — ABNORMAL LOW (ref 98–111)
Creatinine, Ser: 1.39 mg/dL — ABNORMAL HIGH (ref 0.44–1.00)
GFR calc Af Amer: 41 mL/min — ABNORMAL LOW (ref >60)
GFR calc non Af Amer: 35 mL/min — ABNORMAL LOW (ref >60)
Glucose, Bld: 166 mg/dL — ABNORMAL HIGH (ref 70–99)
Potassium: 3.7 mmol/L (ref 3.5–5.1)
Sodium: 134 mmol/L — ABNORMAL LOW (ref 135–145)

## 2019-08-15 LAB — MAGNESIUM: Magnesium: 2.4 mg/dL (ref 1.7–2.4)

## 2019-08-15 LAB — CBC
HCT: 36.2 % (ref 36.0–46.0)
Hemoglobin: 12.4 g/dL (ref 12.0–15.0)
MCH: 30.7 pg (ref 26.0–34.0)
MCHC: 34.3 g/dL (ref 30.0–36.0)
MCV: 89.6 fL (ref 80.0–100.0)
Platelets: 188 10*3/uL (ref 150–400)
RBC: 4.04 MIL/uL (ref 3.87–5.11)
RDW: 13.2 % (ref 11.5–15.5)
WBC: 15.7 10*3/uL — ABNORMAL HIGH (ref 4.0–10.5)
nRBC: 0 % (ref 0.0–0.2)

## 2019-08-15 LAB — PROTEIN, BODY FLUID (OTHER): Total Protein, Body Fluid Other: 1.4 g/dL

## 2019-08-15 MED ORDER — ENOXAPARIN SODIUM 30 MG/0.3ML ~~LOC~~ SOLN
30.0000 mg | SUBCUTANEOUS | Status: DC
  Administered 2019-08-15: 30 mg via SUBCUTANEOUS
  Filled 2019-08-15: qty 0.3

## 2019-08-15 MED ORDER — LORAZEPAM 0.5 MG PO TABS
0.5000 mg | ORAL_TABLET | Freq: Once | ORAL | Status: AC
  Administered 2019-08-15: 17:00:00 0.5 mg via ORAL
  Filled 2019-08-15: qty 1

## 2019-08-15 NOTE — Care Management Important Message (Signed)
Important Message  Patient Details  Name: Tina [name] MRN: [id number] Date of Birth: May 11, 1935   Medicare Important Message Given:  Yes  Initial Medicare IM given by Patient Access Associate on 08/14/2019 at 10:55am.     Dannette Barbara 08/15/2019, 8:33 AM

## 2019-08-15 NOTE — Progress Notes (Signed)
Patient states breathing "doesn't feel right" like she can't breathe.  Oxygenation 96% on 2 liters nasal cannula. Patient doesn't appear in distress but is anxious. She requests for Dr. Vella Kohler to be consulted.  MD on call is notified and ativan and ABG ordered.  RT notified for  ABG.

## 2019-08-15 NOTE — TOC Progression Note (Addendum)
Transition of Care [facility]) - Progression Note    Patient Details  Name: Tina [name] MRN: [id number] Date of Birth: 26-May-1934  Transition of Care [facility]) CM/SW Contact  Beverly Sessions, RN Phone Number: 08/15/2019, 9:13 AM  Clinical Narrative:    Notified that patient would need home health at discharge  RNCM spoke with patient she is in agreement.  States that she would like to use [facility].  Referral made to Santiago Glad with [facility] on overnight pulse ox patient does not qualify for nocturnal O2   Expected Discharge Plan: [place] Barriers to Discharge: Continued Medical Work up  Expected Discharge Plan and Services Expected Discharge Plan: [place] arrangements for the past 2 months: Single Family Home                           HH Arranged: RN, PT [facility] Agency: [place] ([facility]) Date HH Agency Contacted: 08/15/19   Representative spoke with at [place] ([place]) Interventions    Readmission Risk Interventions Readmission Risk Prevention Plan 08/14/2019  Transportation Screening Complete  PCP or Specialist Appt within 3-5 Days Complete  HRI or [place] (No Data)  Social Work Consult for [place] Planning/Counseling [place] Not Applicable  Medication Review Press photographer) Complete  Some recent data might be hidden

## 2019-08-15 NOTE — Progress Notes (Signed)
Pharmacy Lovenox Dosing  85 y.o. female admitted with Shortness of Breath . Patient ordered Lovenox 40 mg daily for VTE prophylaxis.   Filed Weights   08/13/19 1228  Weight: 62 kg (136 lb 11 oz)    Body mass index is 22.75 kg/m.  Estimated Creatinine Clearance: 28.1 mL/min (A) (by C-G formula based on SCr of 1.39 mg/dL (H)).  Will adjust Lovenox dosing to 30 mg Q24 hours.   Tina [name] 08/15/2019 3:21 PM

## 2019-08-15 NOTE — Progress Notes (Addendum)
PROGRESS NOTE    MAALLE STARRETT  ZDG:[id number] DOB: 1934/07/05 DOA: 08/13/2019 PCP: Einar Pheasant, MD    Assessment & Plan:   Active Problems:   Dyspnea    Tykisha Areola Viersis a 85 y.o.Caucasian femalewith medical history significant forCOPD,hypertension,diastolic CHF,CAD, CKD 3b whopresented with persistent dyspnea.   # Dyspnea 2/2 COPD exacerbation # Chronic dyspnea --Able to maintain O2 sat, however, has increased RR and visibly uncomfortable.  Lung exams revealed coarse wheezes and rhonchi.  Presented with WBC 20.6, though no fever, or consolidation seen on CXR.  Does have mod left pleural effusion, but doesn't seem large enough to cause this amount of dyspnea.  Was recently hospitalized for COPD exacerbation only about a week ago, and finished 5 days of prednisone 40 mg daily, maybe needs a longer course or taper? --neg D-dimer, procal 0.15 PLAN: --Pulm consult, recommend below:  - continue steroid as prednisone 40mg  today --patient will likely require prolonged steroid taper on d/c from 40 to 5mg  over period of 14days --DuoNeb q4h scheduled --continue azithromycin 500 mg daily   - Based on overnight pulse ox patient does not qualify for nocturnal O2   - Roflumilast 220mcg po  - IS and flutter at bedside   - MetaNeb with saline for recruitment of LLL atelectasis -PT/OT -outpatient pulmonary rehab  Hx of Pulmonary embolism - finished 6 months of anticoagulation  --D-dimer neg during this admission. -patient may need to be evaluated for chronic thromboembolic disease as this does cause signficinant dyspnea  Acute on Chronic diastolic CHF exacerbation --Last TTE on 08/03/19 showed LVEF 50-55% with grade I diastolic dysfunction. Pt was discharged with PRN torsemide due to AKI.  CXR on presentation didn't show pulm edema, however, did have mod pleural effusion. --BNP 1107 --s/p IV lasix 40 mg daily for 2 days, and already Cr bumped PLAN: --Hold lasix today --Strict  I/O --Low sodium diet  Mod left pleural effusion --increased since 08/02/19.  Maybe contributing to dyspnea. --Thoracentesis 4/1 with 300 cc removed, transudate  CKD 3b --Cr 1.09 on presentation, which is baseline.  Uncontrolled HTN --was discharged on increase hydralazine to100mg  TID, metop to100mg  BID and home amlodipine (home losartan held due to AKI), and noted to have elevated BP at home. PLAN: --continue home amlodipine, hydralazine, and metop --IV hydralazine PRN  Historycoronary artery disease Continue statin continue aspirin, Plavix  Peripheral neuropathy and gait dysfunction --continue home gabapentin   DVT prophylaxis: Lovenox SQ Code Status: DNR  Family Communication:  Disposition Plan: Home with HH, in 1-2 days, currently does not qualify for supplemental O2.   Subjective and Interval History:  Pt reported feeling very short-of-breath after eating her lunch.  Also got short-of-breath after eating breakfast.  No fever, chest pain, abdominal pain, N/V/D, dysuria.  Based on overnight pulse ox patient does not qualify for nocturnal O2.   Objective: Vitals:   08/15/19 0809 08/15/19 0935 08/15/19 1243 08/15/19 1515  BP:  (!) 178/54 (!) 164/56   Pulse:  75 77 78  Resp:  20 20 20   Temp:  97.7 F (36.5 C) 97.8 F (36.6 C)   TempSrc:  Oral Oral   SpO2: 92% 93% 94% 95%  Weight:      Height:        Intake/Output Summary (Last 24 hours) at 08/15/2019 1550 Last data filed at 08/15/2019 1100 Gross per 24 hour  Intake 480 ml  Output 1050 ml  Net -570 ml   Filed Weights   08/13/19 1228  Weight:  62 kg    Examination:   Constitutional: NAD, AAOx3 HEENT: conjunctivae and lids normal, EOMI CV: RRR no M,R,G. Distal pulses +2.  No cyanosis.   RESP: lungs sound more clear today, reduced lung sounds, normal work of breathing, on 2L GI: +BS, NTND Extremities: Mild non-pitting edema in BLE SKIN: warm, dry, extensive bruising on both lower legs Neuro: II  - XII grossly intact.  Sensation intact   Data Reviewed: I have personally reviewed following labs and imaging studies  CBC: Recent Labs  Lab 08/13/19 1234 08/14/19 0544 08/15/19 0544  WBC 20.6* 10.7* 15.7*  NEUTROABS 16.5*  --   --   HGB 13.7 12.7 12.4  HCT 40.5 37.1 36.2  MCV 91.0 89.8 89.6  PLT 217 196 338   Basic Metabolic Panel: Recent Labs  Lab 08/13/19 1234 08/14/19 0544 08/15/19 0544  NA 136 135 134*  K 3.7 3.5 3.7  CL 98 98 96*  CO2 27 27 27   GLUCOSE 196* 158* 166*  BUN 24* 25* 42*  CREATININE 1.09* 1.16* 1.39*  CALCIUM 9.4 9.0 8.9  MG  --  2.4 2.4   GFR: Estimated Creatinine Clearance: 28.1 mL/min (A) (by C-G formula based on SCr of 1.39 mg/dL (H)). Liver Function Tests: No results for input(s): AST, ALT, ALKPHOS, BILITOT, PROT, ALBUMIN in the last 168 hours. No results for input(s): LIPASE, AMYLASE in the last 168 hours. No results for input(s): AMMONIA in the last 168 hours. Coagulation Profile: No results for input(s): INR, PROTIME in the last 168 hours. Cardiac Enzymes: No results for input(s): CKTOTAL, CKMB, CKMBINDEX, TROPONINI in the last 168 hours. BNP (last 3 results) No results for input(s): PROBNP in the last 8760 hours. HbA1C: No results for input(s): HGBA1C in the last 72 hours. CBG: No results for input(s): GLUCAP in the last 168 hours. Lipid Profile: No results for input(s): CHOL, HDL, LDLCALC, TRIG, CHOLHDL, LDLDIRECT in the last 72 hours. Thyroid Function Tests: No results for input(s): TSH, T4TOTAL, FREET4, T3FREE, THYROIDAB in the last 72 hours. Anemia Panel: No results for input(s): VITAMINB12, FOLATE, FERRITIN, TIBC, IRON, RETICCTPCT in the last 72 hours. Sepsis Labs: Recent Labs  Lab 08/13/19 1949  PROCALCITON 0.15    Recent Results (from the past 240 hour(s))  SARS CORONAVIRUS 2 (TAT 6-24 HRS) Nasopharyngeal Nasopharyngeal Swab     Status: None   Collection Time: 08/13/19  2:46 PM   Specimen: Nasopharyngeal Swab    Result Value Ref Range Status   SARS Coronavirus 2 NEGATIVE NEGATIVE Final    Comment: (NOTE) SARS-CoV-2 target nucleic acids are NOT DETECTED. The SARS-CoV-2 RNA is generally detectable in upper and lower respiratory specimens during the acute phase of infection. Negative results do not preclude SARS-CoV-2 infection, do not rule out co-infections with other pathogens, and should not be used as the sole basis for treatment or other patient management decisions. Negative results must be combined with clinical observations, patient history, and epidemiological information. The expected result is Negative. Fact Sheet for Patients: SugarRoll.be Fact Sheet for Healthcare Providers: https://www.woods-mathews.com/ This test is not yet approved or cleared by the [place] FDA and  has been authorized for detection and/or diagnosis of SARS-CoV-2 by FDA under an Emergency Use Authorization (EUA). This EUA will remain  in effect (meaning this test can be used) for the duration of the COVID-19 declaration under Section 56 4(b)(1) of the Act, 21 U.S.C. section 360bbb-3(b)(1), unless the authorization is terminated or revoked sooner. Performed at [facility], [address] [place], [place] [address]       Radiology Studies: [facility] Chest [place] 1 View  Result Date: 08/14/2019 CLINICAL DATA:  85 year old female status post thoracentesis EXAM: PORTABLE CHEST 1 VIEW COMPARISON:  08/13/2019 FINDINGS: Cardiomediastinal silhouette unchanged in size and contour. Evidence of prior PTCA. Mild interlobular septal thickening, seems increased from the prior. Coarsened interstitial markings persist from the prior. Decreased opacity at the left lung base with persistent blunting at the left costophrenic angle. No pneumothorax. Minimal blunting at the right costophrenic angle. IMPRESSION: Improved left-sided pleural effusion status post thoracentesis. No  pneumothorax. New trace right-sided pleural fluid. Early pulmonary edema. Electronically Signed   By: Corrie Mckusick D.O.   On: 08/14/2019 15:34   US THORACENTESIS ASP PLEURAL SPACE W/IMG GUIDE  Result Date: 08/14/2019 INDICATION: 85 year old female with a history of left-sided pleural effusion EXAM: ULTRASOUND GUIDED LEFT THORACENTESIS MEDICATIONS: None. COMPLICATIONS: None PROCEDURE: An ultrasound guided thoracentesis was thoroughly discussed with the patient and questions answered. The benefits, risks, alternatives and complications were also discussed. The patient understands and wishes to proceed with the procedure. Written consent was obtained. Ultrasound was performed to localize and mark an adequate pocket of fluid in the left chest. The area was then prepped and draped in the normal sterile fashion. 1% Lidocaine was used for local anesthesia. Under ultrasound guidance a 8 Fr Safe-T-Centesis catheter was introduced. Thoracentesis was performed. The catheter was removed and a dressing applied. FINDINGS: A total of approximately 300 cc of thin yellow fluid was removed. The blood-tinged fluid is present secondary to contamination from the initial drain placement. Samples were sent to the laboratory as requested by the clinical team. IMPRESSION: Status post ultrasound-guided left-sided thoracentesis Signed, Dulcy Fanny. Dellia Nims, RPVI [facility] Radiology Electronically Signed   By: Corrie Mckusick D.O.   On: 08/14/2019 15:31     Scheduled Meds: . amLODipine  10 mg Oral Daily  . aspirin EC  81 mg Oral Daily  . azithromycin  500 mg Oral Daily  . clopidogrel  75 mg Oral Daily  . enoxaparin (LOVENOX) injection  30 mg Subcutaneous Q24H  . gabapentin  200 mg Oral QHS  . hydrALAZINE  100 mg Oral TID  . ipratropium-albuterol  3 mL Nebulization Q4H  . labetalol  200 mg Oral BID  . methylPREDNISolone (SOLU-MEDROL) injection  20 mg Intravenous Daily  .  pravastatin  40 mg Oral q1800  . roflumilast  250 mcg Oral Daily   Continuous Infusions:   LOS: 2 days     Enzo Bi, MD [facility] If 7PM-7AM, please contact night-coverage 08/15/2019, 3:50 PM

## 2019-08-16 ENCOUNTER — Inpatient Hospital Stay: Payer: Medicare Other

## 2019-08-16 LAB — CBC
HCT: 35.6 % — ABNORMAL LOW (ref 36.0–46.0)
Hemoglobin: 12 g/dL (ref 12.0–15.0)
MCH: 30.5 pg (ref 26.0–34.0)
MCHC: 33.7 g/dL (ref 30.0–36.0)
MCV: 90.6 fL (ref 80.0–100.0)
Platelets: 183 10*3/uL (ref 150–400)
RBC: 3.93 MIL/uL (ref 3.87–5.11)
RDW: 13.3 % (ref 11.5–15.5)
WBC: 18 10*3/uL — ABNORMAL HIGH (ref 4.0–10.5)
nRBC: 0 % (ref 0.0–0.2)

## 2019-08-16 LAB — BASIC METABOLIC PANEL
Anion gap: 9 (ref 5–15)
BUN: 38 mg/dL — ABNORMAL HIGH (ref 8–23)
CO2: 28 mmol/L (ref 22–32)
Calcium: 9.1 mg/dL (ref 8.9–10.3)
Chloride: 97 mmol/L — ABNORMAL LOW (ref 98–111)
Creatinine, Ser: 1.2 mg/dL — ABNORMAL HIGH (ref 0.44–1.00)
GFR calc Af Amer: 49 mL/min — ABNORMAL LOW (ref >60)
GFR calc non Af Amer: 42 mL/min — ABNORMAL LOW (ref >60)
Glucose, Bld: 133 mg/dL — ABNORMAL HIGH (ref 70–99)
Potassium: 3.7 mmol/L (ref 3.5–5.1)
Sodium: 134 mmol/L — ABNORMAL LOW (ref 135–145)

## 2019-08-16 LAB — GLUCOSE, CAPILLARY: Glucose-Capillary: 205 mg/dL — ABNORMAL HIGH (ref 70–99)

## 2019-08-16 LAB — MAGNESIUM: Magnesium: 2.6 mg/dL — ABNORMAL HIGH (ref 1.7–2.4)

## 2019-08-16 MED ORDER — DIAZEPAM 2 MG PO TABS
1.0000 mg | ORAL_TABLET | Freq: Once | ORAL | Status: DC
  Filled 2019-08-16 (×2): qty 1

## 2019-08-16 MED ORDER — MORPHINE SULFATE (PF) 2 MG/ML IV SOLN
1.0000 mg | Freq: Once | INTRAVENOUS | Status: AC
  Administered 2019-08-16: 22:00:00 1 mg via INTRAVENOUS
  Filled 2019-08-16: qty 1

## 2019-08-16 MED ORDER — CHLORHEXIDINE GLUCONATE CLOTH 2 % EX PADS
6.0000 | MEDICATED_PAD | Freq: Every day | CUTANEOUS | Status: DC
  Administered 2019-08-20 – 2019-08-22 (×3): 6 via TOPICAL

## 2019-08-16 MED ORDER — FUROSEMIDE 10 MG/ML IJ SOLN
20.0000 mg | Freq: Once | INTRAMUSCULAR | Status: AC
  Administered 2019-08-16: 11:00:00 20 mg via INTRAVENOUS
  Filled 2019-08-16: qty 4

## 2019-08-16 MED ORDER — MORPHINE SULFATE (PF) 2 MG/ML IV SOLN
1.0000 mg | Freq: Once | INTRAVENOUS | Status: AC
  Administered 2019-08-16: 1 mg via INTRAVENOUS
  Filled 2019-08-16: qty 1

## 2019-08-16 MED ORDER — METHYLPREDNISOLONE SODIUM SUCC 40 MG IJ SOLR
40.0000 mg | Freq: Once | INTRAMUSCULAR | Status: AC
  Administered 2019-08-16: 40 mg via INTRAVENOUS

## 2019-08-16 MED ORDER — ENOXAPARIN SODIUM 40 MG/0.4ML ~~LOC~~ SOLN
40.0000 mg | SUBCUTANEOUS | Status: DC
  Administered 2019-08-16 – 2019-08-17 (×2): 40 mg via SUBCUTANEOUS
  Filled 2019-08-16 (×2): qty 0.4

## 2019-08-16 NOTE — Progress Notes (Signed)
PROGRESS NOTE    Tina [name]  FXT:[id number] DOB: 10/30/1934 DOA: 08/13/2019 PCP: Einar Pheasant, MD    Assessment & Plan:   Active Problems:   Dyspnea    Tina [name] a 85 y.o.Caucasian femalewith medical history significant forCOPD,hypertension,diastolic CHF,CAD, CKD 3b whopresented with persistent dyspnea.   # Dyspnea 2/2 COPD exacerbation # Chronic dyspnea --Able to maintain O2 sat, however, has increased RR and visibly uncomfortable.  Lung exams revealed coarse wheezes and rhonchi.  Presented with WBC 20.6, though no fever, or consolidation seen on CXR.  Does have mod left pleural effusion, but doesn't seem large enough to cause this amount of dyspnea.  Was recently hospitalized for COPD exacerbation only about a week ago, and finished 5 days of prednisone 40 mg daily, maybe needs a longer course or taper? --neg D-dimer, procal 0.15 PLAN: --Pulm consult, recommend below:  - continue steroid as IV solumedrol 20 mg daily --patient will likely require prolonged steroid taper on d/c from 40 to 5mg  over period of 14days --DuoNeb q4h scheduled --continue azithromycin 500 mg daily   - Based on overnight pulse ox patient does not qualify for nocturnal O2   - Roflumilast 258mcg po  - IS and flutter at bedside   - MetaNeb with saline for recruitment of LLL atelectasis -PT/OT -outpatient pulmonary rehab --Pt's outpatient pulmonologist Dr. Raul Del to see pt on Monday  Hx of Pulmonary embolism - finished 6 months of anticoagulation  --D-dimer neg during this admission.  Acute on Chronic diastolic CHF exacerbation --Last TTE on 08/03/19 showed LVEF 50-55% with grade I diastolic dysfunction. Pt was discharged with PRN torsemide due to AKI.  CXR on presentation didn't show pulm edema, however, did have mod pleural effusion. --BNP 1107 --s/p IV lasix 40 mg daily for 2 days, and already Cr bumped PLAN: --IV Lasix 20 mg x1 today --Strict I/O --Low sodium diet  Mod  left pleural effusion --increased since 08/02/19.  Maybe contributing to dyspnea. --Thoracentesis 4/1 with 300 cc removed, transudate  CKD 3b --Cr 1.09 on presentation, which is baseline.  Uncontrolled HTN --was discharged on increase hydralazine to100mg  TID, metop to100mg  BID and home amlodipine (home losartan held due to AKI), and noted to have elevated BP at home. PLAN: --continue home amlodipine, hydralazine, and metop --IV hydralazine PRN  Historycoronary artery disease Continue statin continue aspirin, Plavix  Peripheral neuropathy and gait dysfunction --continue home gabapentin   DVT prophylaxis: Lovenox SQ Code Status: DNR  Family Communication:  Disposition Plan: Home on Monday after eval by Dr. Raul Del.     Subjective and Interval History:  Pt continued to complain of constant dyspnea that limits all her activities.  Low-dose Ativan yesterday didn't help, and pt denied being anxious.  Neb tx doesn't help.  No fever, N/V.     Objective: Vitals:   08/16/19 0104 08/16/19 0516 08/16/19 0656 08/16/19 1022  BP:  (!) 194/78 (!) 165/95   Pulse:  89    Resp:  20    Temp:  97.6 F (36.4 C)    TempSrc:  Oral    SpO2: 95% 94%  95%  Weight:      Height:        Intake/Output Summary (Last 24 hours) at 08/16/2019 1236 Last data filed at 08/16/2019 0516 Gross per 24 hour  Intake --  Output 600 ml  Net -600 ml   Filed Weights   08/13/19 1228  Weight: 62 kg    Examination:   Constitutional: NAD, AAOx3 HEENT:  conjunctivae and lids normal, EOMI CV: RRR no M,R,G. Distal pulses +2.  No cyanosis.   RESP: lungs sound more clear today, reduced lung sounds, normal work of breathing, on 2L GI: +BS, NTND Extremities: Edema in BLE resolved SKIN: warm, dry, extensive bruising on both lower legs Neuro: II - XII grossly intact.  Sensation intact   Data Reviewed: I have personally reviewed following labs and imaging studies  CBC: Recent Labs  Lab 08/13/19 1234  08/14/19 0544 08/15/19 0544 08/16/19 0459  WBC 20.6* 10.7* 15.7* 18.0*  NEUTROABS 16.5*  --   --   --   HGB 13.7 12.7 12.4 12.0  HCT 40.5 37.1 36.2 35.6*  MCV 91.0 89.8 89.6 90.6  PLT 217 196 188 341   Basic Metabolic Panel: Recent Labs  Lab 08/13/19 1234 08/14/19 0544 08/15/19 0544 08/16/19 0459  NA 136 135 134* 134*  K 3.7 3.5 3.7 3.7  CL 98 98 96* 97*  CO2 27 27 27 28   GLUCOSE 196* 158* 166* 133*  BUN 24* 25* 42* 38*  CREATININE 1.09* 1.16* 1.39* 1.20*  CALCIUM 9.4 9.0 8.9 9.1  MG  --  2.4 2.4 2.6*   GFR: Estimated Creatinine Clearance: 32.5 mL/min (A) (by C-G formula based on SCr of 1.2 mg/dL (H)). Liver Function Tests: No results for input(s): AST, ALT, ALKPHOS, BILITOT, PROT, ALBUMIN in the last 168 hours. No results for input(s): LIPASE, AMYLASE in the last 168 hours. No results for input(s): AMMONIA in the last 168 hours. Coagulation Profile: No results for input(s): INR, PROTIME in the last 168 hours. Cardiac Enzymes: No results for input(s): CKTOTAL, CKMB, CKMBINDEX, TROPONINI in the last 168 hours. BNP (last 3 results) No results for input(s): PROBNP in the last 8760 hours. HbA1C: No results for input(s): HGBA1C in the last 72 hours. CBG: No results for input(s): GLUCAP in the last 168 hours. Lipid Profile: No results for input(s): CHOL, HDL, LDLCALC, TRIG, CHOLHDL, LDLDIRECT in the last 72 hours. Thyroid Function Tests: No results for input(s): TSH, T4TOTAL, FREET4, T3FREE, THYROIDAB in the last 72 hours. Anemia Panel: No results for input(s): VITAMINB12, FOLATE, FERRITIN, TIBC, IRON, RETICCTPCT in the last 72 hours. Sepsis Labs: Recent Labs  Lab 08/13/19 1949  PROCALCITON 0.15    Recent Results (from the past 240 hour(s))  SARS CORONAVIRUS 2 (TAT 6-24 HRS) Nasopharyngeal Nasopharyngeal Swab     Status: None   Collection Time: 08/13/19  2:46 PM   Specimen: Nasopharyngeal Swab  Result Value Ref Range Status   SARS Coronavirus 2 NEGATIVE  NEGATIVE Final    Comment: (NOTE) SARS-CoV-2 target nucleic acids are NOT DETECTED. The SARS-CoV-2 RNA is generally detectable in upper and lower respiratory specimens during the acute phase of infection. Negative results do not preclude SARS-CoV-2 infection, do not rule out co-infections with other pathogens, and should not be used as the sole basis for treatment or other patient management decisions. Negative results must be combined with clinical observations, patient history, and epidemiological information. The expected result is Negative. Fact Sheet for Patients: SugarRoll.be Fact Sheet for Healthcare Providers: https://www.woods-mathews.com/ This test is not yet approved or cleared by the [place] FDA and  has been authorized for detection and/or diagnosis of SARS-CoV-2 by FDA under an Emergency Use Authorization (EUA). This EUA will remain  in effect (meaning this test can be used) for the duration of the COVID-19 declaration under Section 56 4(b)(1) of the Act, 21 U.S.C. section 360bbb-3(b)(1), unless the authorization is terminated or revoked sooner.  Performed at [facility], [place] [address], [place], [place] [address]       Radiology Studies: DG Chest [place] 1 View  Result Date: 08/14/2019 CLINICAL DATA:  85 year old female status post thoracentesis EXAM: PORTABLE CHEST 1 VIEW COMPARISON:  08/13/2019 FINDINGS: Cardiomediastinal silhouette unchanged in size and contour. Evidence of prior PTCA. Mild interlobular septal thickening, seems increased from the prior. Coarsened interstitial markings persist from the prior. Decreased opacity at the left lung base with persistent blunting at the left costophrenic angle. No pneumothorax. Minimal blunting at the right costophrenic angle. IMPRESSION: Improved left-sided pleural effusion status post thoracentesis. No pneumothorax. New trace right-sided pleural fluid. Early pulmonary edema.  Electronically Signed   By: Corrie Mckusick D.O.   On: 08/14/2019 15:34   US THORACENTESIS ASP PLEURAL SPACE W/IMG GUIDE  Result Date: 08/14/2019 INDICATION: 85 year old female with a history of left-sided pleural effusion EXAM: ULTRASOUND GUIDED LEFT THORACENTESIS MEDICATIONS: None. COMPLICATIONS: None PROCEDURE: An ultrasound guided thoracentesis was thoroughly discussed with the patient and questions answered. The benefits, risks, alternatives and complications were also discussed. The patient understands and wishes to proceed with the procedure. Written consent was obtained. Ultrasound was performed to localize and mark an adequate pocket of fluid in the left chest. The area was then prepped and draped in the normal sterile fashion. 1% Lidocaine was used for local anesthesia. Under ultrasound guidance a 8 Fr Safe-T-Centesis catheter was introduced. Thoracentesis was performed. The catheter was removed and a dressing applied. FINDINGS: A total of approximately 300 cc of thin yellow fluid was removed. The blood-tinged fluid is present secondary to contamination from the initial drain placement. Samples were sent to the laboratory as requested by the clinical team. IMPRESSION: Status post ultrasound-guided left-sided thoracentesis Signed, Dulcy Fanny. Dellia Nims, RPVI [facility] Electronically Signed   By: Corrie Mckusick D.O.   On: 08/14/2019 15:31     Scheduled Meds: . amLODipine  10 mg Oral Daily  . aspirin EC  81 mg Oral Daily  . azithromycin  500 mg Oral Daily  . clopidogrel  75 mg Oral Daily  . enoxaparin (LOVENOX) injection  40 mg Subcutaneous Q24H  . gabapentin  200 mg Oral QHS  . hydrALAZINE  100 mg Oral TID  . ipratropium-albuterol  3 mL Nebulization Q4H  . labetalol  200 mg Oral BID  . methylPREDNISolone (SOLU-MEDROL) injection  20 mg Intravenous Daily  . pravastatin  40 mg Oral q1800  . roflumilast  250 mcg Oral Daily   Continuous  Infusions:   LOS: 3 days     Enzo Bi, MD [facility] If 7PM-7AM, please contact night-coverage 08/16/2019, 12:36 PM

## 2019-08-16 NOTE — Progress Notes (Signed)
    BRIEF OVERNIGHT PROGRESS REPORT  SUBJECTIVE: Patient noted with worsening shortness of breath, wheezing, increased work of breathing, tachycardia and hypoxia.  OBJECTIVE: On arrival to the bedside, she was afebrile with blood pressure 181/87 mm Hg and pulse rate 112 beats/min. There were no focal neurological deficits; she was alert but very dyspneic at rest.    ASSESSMENT/PLAN: 1. Acute hypoxemic respiratory failure due to severe COPD execerbation and component of CHF?Marland Kitchen - Transfer to stepdown - Will place on Bipap - ABG 08/15/19 with no evidence of hpoxia or hypercapnea, CXR 08/16/19 shows moderate pleural effusion - Bronchodilators (albuterol/ipratropium) standing and PRN - Continue Solumedrol 20 mg IV daily as scheduled  2. Acute on chronic Diastolic Congestive Heart Failure: Acute presentation likely due to volume overload? with associated symptoms of SOB. BNP elevated at 1107 - Last Echo 08/03/2019 , EF 50-55% - Received Lasix 40 mg x 1 during the day. - Low Na diet  - Check daily weight - Strict I&Os  3. Hypertension - BP persistently elevated despite multiple scheduled and PRN antihypertensive. Also on IV steroids could be contributing. - Received SL nitro with great improvement - Continue current BP meds   Rufina Falco, DNP, CCRN, FNP-C [facility] Nurse Practitioner Between 7pm to 7am - Pager [phone]  After 7am go to [email or web address] - password:[email or web address]  [facility] [employer]  [phone]

## 2019-08-16 NOTE — Hospital Course (Addendum)
08/16/19@2300  -transfer to stepdown for acute respiratory failure with hypoxia requiring Bipap

## 2019-08-16 NOTE — Progress Notes (Signed)
Patient seen for svn tx. Patient was resting and has stated she does not want any more txs tonight because they are not helping with her breathing. Patient has a loose npc. o2 saturation on 3liters in 90s. RN updated. Will continue to monitor.

## 2019-08-16 NOTE — Progress Notes (Signed)
svn tx given per NP due to distress. HR and RR increased. Patient transferred to icu for continued care

## 2019-08-17 DIAGNOSIS — Z86711 Personal history of pulmonary embolism: Secondary | ICD-10-CM | POA: Diagnosis present

## 2019-08-17 DIAGNOSIS — J90 Pleural effusion, not elsewhere classified: Secondary | ICD-10-CM | POA: Diagnosis present

## 2019-08-17 DIAGNOSIS — G629 Polyneuropathy, unspecified: Secondary | ICD-10-CM

## 2019-08-17 LAB — CBC
HCT: 36 % (ref 36.0–46.0)
Hemoglobin: 12.4 g/dL (ref 12.0–15.0)
MCH: 30.7 pg (ref 26.0–34.0)
MCHC: 34.4 g/dL (ref 30.0–36.0)
MCV: 89.1 fL (ref 80.0–100.0)
Platelets: 177 10*3/uL (ref 150–400)
RBC: 4.04 MIL/uL (ref 3.87–5.11)
RDW: 13.5 % (ref 11.5–15.5)
WBC: 18.6 10*3/uL — ABNORMAL HIGH (ref 4.0–10.5)
nRBC: 0 % (ref 0.0–0.2)

## 2019-08-17 LAB — BASIC METABOLIC PANEL
Anion gap: 11 (ref 5–15)
BUN: 39 mg/dL — ABNORMAL HIGH (ref 8–23)
CO2: 27 mmol/L (ref 22–32)
Calcium: 9.2 mg/dL (ref 8.9–10.3)
Chloride: 98 mmol/L (ref 98–111)
Creatinine, Ser: 1.14 mg/dL — ABNORMAL HIGH (ref 0.44–1.00)
GFR calc Af Amer: 52 mL/min — ABNORMAL LOW (ref >60)
GFR calc non Af Amer: 45 mL/min — ABNORMAL LOW (ref >60)
Glucose, Bld: 180 mg/dL — ABNORMAL HIGH (ref 70–99)
Potassium: 4.3 mmol/L (ref 3.5–5.1)
Sodium: 136 mmol/L (ref 135–145)

## 2019-08-17 LAB — MRSA PCR SCREENING: MRSA by PCR: NEGATIVE

## 2019-08-17 LAB — MAGNESIUM: Magnesium: 2.5 mg/dL — ABNORMAL HIGH (ref 1.7–2.4)

## 2019-08-17 MED ORDER — FUROSEMIDE 10 MG/ML IJ SOLN
4.0000 mg/h | INTRAVENOUS | Status: DC
  Administered 2019-08-17 – 2019-08-19 (×2): 4 mg/h via INTRAVENOUS
  Filled 2019-08-17 (×2): qty 25

## 2019-08-17 MED ORDER — NITROGLYCERIN 0.4 MG SL SUBL
0.4000 mg | SUBLINGUAL_TABLET | Freq: Once | SUBLINGUAL | Status: AC
  Administered 2019-08-17: 0.4 mg via SUBLINGUAL

## 2019-08-17 MED ORDER — NITROGLYCERIN 0.4 MG SL SUBL
0.4000 mg | SUBLINGUAL_TABLET | Freq: Once | SUBLINGUAL | Status: AC
  Administered 2019-08-17: 0.4 mg via SUBLINGUAL
  Filled 2019-08-17: qty 1

## 2019-08-17 MED ORDER — BUDESONIDE 0.5 MG/2ML IN SUSP
0.5000 mg | Freq: Two times a day (BID) | RESPIRATORY_TRACT | Status: DC
  Administered 2019-08-17 – 2019-08-22 (×10): 0.5 mg via RESPIRATORY_TRACT
  Filled 2019-08-17 (×10): qty 2

## 2019-08-17 MED ORDER — FUROSEMIDE 10 MG/ML IJ SOLN
40.0000 mg | Freq: Once | INTRAMUSCULAR | Status: AC
  Administered 2019-08-17: 40 mg via INTRAVENOUS
  Filled 2019-08-17: qty 4

## 2019-08-17 NOTE — Progress Notes (Signed)
PROGRESS NOTE    Tina [name]  XVQ:[id number] DOB: [birth year] DOA: 08/13/2019 PCP: Einar Pheasant, MD    Assessment & Plan:   Active Problems:   Dyspnea    Tina [name] a 85 y.o.Caucasian femalewith medical history significant forCOPD,hypertension,diastolic CHF,CAD, CKD 3b whopresented with persistent dyspnea.   # Dyspnea 2/2 COPD exacerbation # Chronic dyspnea # Severe COPD --Able to maintain O2 sat, however, has increased RR and visibly uncomfortable. Presented with WBC 20.6, though no fever, or consolidation seen on CXR.  Did have mod left pleural effusion, but didn't seem large enough to cause this amount of dyspnea.  Was recently hospitalized for COPD exacerbation only about a week ago, and finished 5 days of prednisone 40 mg daily, maybe needs a longer course or taper? --neg D-dimer, procal 0.15 --Based on overnight pulse ox patient does not qualify for nocturnal O2 PLAN: --Pulm consult on 4/1, recommend below:  - continue steroid as IV solumedrol 20 mg daily --patient will likely require prolonged steroid taper on d/c from 40 to 5mg  over period of 14days --DuoNeb q4h scheduled --Start pulmicort --continue azithromycin 500 mg daily   - Roflumilast 271mcg po  - IS and flutter at bedside   - MetaNeb with saline for recruitment of LLL atelectasis -PT/OT -outpatient pulmonary rehab --Pt's outpatient pulmonologist Dr. Raul [name] to see pt on Monday --Palliative consult on Monday  Hx of Pulmonary embolism - finished 6 months of anticoagulation  --D-dimer neg during this admission.  Acute on Chronic diastolic CHF exacerbation --Last TTE on 08/03/19 showed LVEF 50-55% with grade I diastolic dysfunction. Pt was discharged with PRN torsemide due to AKI.  CXR on presentation didn't show pulm edema, however, did have mod pleural effusion. --BNP 1107 --s/p IV lasix 40 mg daily for 2 days, and already Cr bumped PLAN: --IV Lasix 40 mg x1 f/b lasix gtt@4  --Strict I/O    --Low sodium diet  Mod left pleural effusion --increased since 08/02/19.  Maybe contributing to dyspnea. --Thoracentesis 4/1 with 300 cc removed, transudate, but then it re-accumulated.  CKD 3b --Cr 1.09 on presentation, which is baseline.  Uncontrolled HTN --was discharged on increase hydralazine to100mg  TID, metop to100mg  BID and home amlodipine (home losartan held due to AKI), and noted to have elevated BP at home. PLAN: --continue home amlodipine, hydralazine --continue labetalol (switched from home metop) --IV hydralazine PRN --Order renal artery duplex  Historycoronary artery disease Continue statin continue aspirin, Plavix  Peripheral neuropathy and gait dysfunction --continue home gabapentin   DVT prophylaxis: Lovenox SQ Code Status: DNR  Family Communication:  Disposition Plan: Undetermined when, since pt is currently in stepdown and on BiPAP.     Subjective and Interval History:  Overnight, pt continued to have significant dyspnea, so was transferred to stepdown to start BiPAP.  Pt this morning reported BiPAP was the only thing that helped with her breathing.  IV morphine helped her sleep, but when she woke up, she felt short-of-breath again.  No fever, chest pain, abdominal pain, N/V/D, dysuria.   Objective: Vitals:   08/17/19 1253 08/17/19 1300 08/17/19 1400 08/17/19 1415  BP:  (!) 158/59 (!) 155/70   Pulse: 98 99 89 95  Resp: (!) 22 (!) 25 16 (!) 26  Temp: 98.4 F (36.9 C)     TempSrc: Oral     SpO2: 95% 95% 94% 94%  Weight:      Height:        Intake/Output Summary (Last 24 hours) at 08/17/2019 1454 Last data  filed at 08/17/2019 1415 Gross per 24 hour  Intake 14.38 ml  Output 1550 ml  Net -1535.62 ml   Filed Weights   08/13/19 1228 08/16/19 2313  Weight: 62 kg 62.2 kg    Examination:   Constitutional: NAD, AAOx3 HEENT: conjunctivae and lids normal, EOMI CV: RRR no M,R,G. Distal pulses +2.  No cyanosis.   RESP: No wheezing, on  BiPAP GI: +BS, NTND Extremities: Edema in BLE resolved SKIN: warm, dry, extensive bruising on both lower legs Neuro: II - XII grossly intact.  Sensation intact   Data Reviewed: I have personally reviewed following labs and imaging studies  CBC: Recent Labs  Lab 08/13/19 1234 08/14/19 0544 08/15/19 0544 08/16/19 0459 08/17/19 0354  WBC 20.6* 10.7* 15.7* 18.0* 18.6*  NEUTROABS 16.5*  --   --   --   --   HGB 13.7 12.7 12.4 12.0 12.4  HCT 40.5 37.1 36.2 35.6* 36.0  MCV 91.0 89.8 89.6 90.6 89.1  PLT 217 196 188 183 010   Basic Metabolic Panel: Recent Labs  Lab 08/13/19 1234 08/14/19 0544 08/15/19 0544 08/16/19 0459 08/17/19 0354  NA 136 135 134* 134* 136  K 3.7 3.5 3.7 3.7 4.3  CL 98 98 96* 97* 98  CO2 27 27 27 28 27   GLUCOSE 196* 158* 166* 133* 180*  BUN 24* 25* 42* 38* 39*  CREATININE 1.09* 1.16* 1.39* 1.20* 1.14*  CALCIUM 9.4 9.0 8.9 9.1 9.2  MG  --  2.4 2.4 2.6* 2.5*   GFR: Estimated Creatinine Clearance: 34.2 mL/min (A) (by C-G formula based on SCr of 1.14 mg/dL (H)). Liver Function Tests: No results for input(s): AST, ALT, ALKPHOS, BILITOT, PROT, ALBUMIN in the last 168 hours. No results for input(s): LIPASE, AMYLASE in the last 168 hours. No results for input(s): AMMONIA in the last 168 hours. Coagulation Profile: No results for input(s): INR, PROTIME in the last 168 hours. Cardiac Enzymes: No results for input(s): CKTOTAL, CKMB, CKMBINDEX, TROPONINI in the last 168 hours. BNP (last 3 results) No results for input(s): PROBNP in the last 8760 hours. HbA1C: No results for input(s): HGBA1C in the last 72 hours. CBG: Recent Labs  Lab 08/16/19 2310  GLUCAP 205*   Lipid Profile: No results for input(s): CHOL, HDL, LDLCALC, TRIG, CHOLHDL, LDLDIRECT in the last 72 hours. Thyroid Function Tests: No results for input(s): TSH, T4TOTAL, FREET4, T3FREE, THYROIDAB in the last 72 hours. Anemia Panel: No results for input(s): VITAMINB12, FOLATE, FERRITIN, TIBC,  IRON, RETICCTPCT in the last 72 hours. Sepsis Labs: Recent Labs  Lab 08/13/19 1949  PROCALCITON 0.15    Recent Results (from the past 240 hour(s))  SARS CORONAVIRUS 2 (TAT 6-24 HRS) Nasopharyngeal Nasopharyngeal Swab     Status: None   Collection Time: 08/13/19  2:46 PM   Specimen: Nasopharyngeal Swab  Result Value Ref Range Status   SARS Coronavirus 2 NEGATIVE NEGATIVE Final    Comment: (NOTE) SARS-CoV-2 target nucleic acids are NOT DETECTED. The SARS-CoV-2 RNA is generally detectable in upper and lower respiratory specimens during the acute phase of infection. Negative results do not preclude SARS-CoV-2 infection, do not rule out co-infections with other pathogens, and should not be used as the sole basis for treatment or other patient management decisions. Negative results must be combined with clinical observations, patient history, and epidemiological information. The expected result is Negative. Fact Sheet for Patients: SugarRoll.be Fact Sheet for Healthcare Providers: https://www.woods-mathews.com/ This test is not yet approved or cleared by the [place] FDA  and  has been authorized for detection and/or diagnosis of SARS-CoV-2 by FDA under an Emergency Use Authorization (EUA). This EUA will remain  in effect (meaning this test can be used) for the duration of the COVID-19 declaration under Section 56 4(b)(1) of the Act, 21 U.S.C. section 360bbb-3(b)(1), unless the authorization is terminated or revoked sooner. Performed at [facility], [place] [address] [place], [place] [address]   MRSA PCR Screening     Status: None   Collection Time: 08/16/19 11:05 PM   Specimen: Nasopharyngeal  Result Value Ref Range Status   MRSA by PCR NEGATIVE NEGATIVE Final    Comment:        The GeneXpert MRSA Assay (FDA approved for NASAL specimens only), is one component of a comprehensive MRSA colonization surveillance program. It is  not intended to diagnose MRSA infection nor to guide or monitor treatment for MRSA infections. Performed at [facility], [address], [place], [place] [address]       Radiology Studies: DG Chest [place] 1 View  Result Date: 08/16/2019 CLINICAL DATA:  Dyspnea EXAM: PORTABLE CHEST 1 VIEW COMPARISON:  08/02/2019 FINDINGS: Increased small to moderate left pleural effusion and left basilar atelectasis. Similar small right pleural effusion. Persistent interstitial prominence likely reflecting mild edema superimposed chronic changes. Similar cardiomediastinal contours. IMPRESSION: Increased small to moderate left pleural effusion with associated left basilar atelectasis. Similar small right pleural effusion. Persistent probable mild pulmonary edema superimposed on chronic interstitial prominence. Electronically Signed   By: Macy Mis M.D.   On: 08/16/2019 16:28     Scheduled Meds: . amLODipine  10 mg Oral Daily  . aspirin EC  81 mg Oral Daily  . azithromycin  500 mg Oral Daily  . budesonide (PULMICORT) nebulizer solution  0.5 mg Nebulization BID  . Chlorhexidine Gluconate Cloth  6 each Topical Daily  . clopidogrel  75 mg Oral Daily  . diazepam  1 mg Oral Once  . enoxaparin (LOVENOX) injection  40 mg Subcutaneous Q24H  . gabapentin  200 mg Oral QHS  . hydrALAZINE  100 mg Oral TID  . ipratropium-albuterol  3 mL Nebulization Q4H  . labetalol  200 mg Oral BID  . methylPREDNISolone (SOLU-MEDROL) injection  20 mg Intravenous Daily  . pravastatin  40 mg Oral q1800  . roflumilast  250 mcg Oral Daily   Continuous Infusions: . furosemide (LASIX) infusion 4 mg/hr (08/17/19 1400)     LOS: 4 days     Enzo Bi, MD [facility] If 7PM-7AM, please contact night-coverage 08/17/2019, 2:54 PM

## 2019-08-18 ENCOUNTER — Inpatient Hospital Stay: Payer: Medicare Other

## 2019-08-18 DIAGNOSIS — J449 Chronic obstructive pulmonary disease, unspecified: Secondary | ICD-10-CM

## 2019-08-18 LAB — BASIC METABOLIC PANEL
Anion gap: 11 (ref 5–15)
BUN: 35 mg/dL — ABNORMAL HIGH (ref 8–23)
CO2: 33 mmol/L — ABNORMAL HIGH (ref 22–32)
Calcium: 8.8 mg/dL — ABNORMAL LOW (ref 8.9–10.3)
Chloride: 96 mmol/L — ABNORMAL LOW (ref 98–111)
Creatinine, Ser: 0.96 mg/dL (ref 0.44–1.00)
GFR calc Af Amer: >60 mL/min (ref >60)
GFR calc non Af Amer: 55 mL/min — ABNORMAL LOW (ref >60)
Glucose, Bld: 161 mg/dL — ABNORMAL HIGH (ref 70–99)
Potassium: 3.2 mmol/L — ABNORMAL LOW (ref 3.5–5.1)
Sodium: 140 mmol/L (ref 135–145)

## 2019-08-18 LAB — MAGNESIUM: Magnesium: 2.5 mg/dL — ABNORMAL HIGH (ref 1.7–2.4)

## 2019-08-18 LAB — CBC
HCT: 35 % — ABNORMAL LOW (ref 36.0–46.0)
Hemoglobin: 12.2 g/dL (ref 12.0–15.0)
MCH: 31.1 pg (ref 26.0–34.0)
MCHC: 34.9 g/dL (ref 30.0–36.0)
MCV: 89.3 fL (ref 80.0–100.0)
Platelets: 161 10*3/uL (ref 150–400)
RBC: 3.92 MIL/uL (ref 3.87–5.11)
RDW: 13.8 % (ref 11.5–15.5)
WBC: 16 10*3/uL — ABNORMAL HIGH (ref 4.0–10.5)
nRBC: 0 % (ref 0.0–0.2)

## 2019-08-18 MED ORDER — ENOXAPARIN SODIUM 40 MG/0.4ML ~~LOC~~ SOLN
40.0000 mg | SUBCUTANEOUS | Status: DC
  Administered 2019-08-18 – 2019-08-21 (×4): 40 mg via SUBCUTANEOUS
  Filled 2019-08-18 (×4): qty 0.4

## 2019-08-18 MED ORDER — IPRATROPIUM-ALBUTEROL 0.5-2.5 (3) MG/3ML IN SOLN
3.0000 mL | Freq: Three times a day (TID) | RESPIRATORY_TRACT | Status: DC
  Administered 2019-08-18 – 2019-08-22 (×12): 3 mL via RESPIRATORY_TRACT
  Filled 2019-08-18 (×12): qty 3

## 2019-08-18 MED ORDER — ALBUTEROL SULFATE (2.5 MG/3ML) 0.083% IN NEBU: 2.5000 mg | INHALATION_SOLUTION | RESPIRATORY_TRACT | Status: DC | PRN

## 2019-08-18 MED ORDER — LORAZEPAM 2 MG/ML IJ SOLN
0.5000 mg | Freq: Four times a day (QID) | INTRAMUSCULAR | Status: DC | PRN
  Administered 2019-08-18 (×2): 0.5 mg via INTRAVENOUS
  Filled 2019-08-18 (×2): qty 1

## 2019-08-18 MED ORDER — POTASSIUM CHLORIDE CRYS ER 20 MEQ PO TBCR
40.0000 meq | EXTENDED_RELEASE_TABLET | Freq: Once | ORAL | Status: AC
  Administered 2019-08-18: 40 meq via ORAL
  Filled 2019-08-18: qty 2

## 2019-08-18 NOTE — Progress Notes (Signed)
[name], female   DOB: [birth year], 84 y.o.   MRN: [id number]   Date: 08/18/2019,   MRN# [id number] Tina [name] Dec 31, 1934 Code Status: DNR  Hosp day:'@LENGTHOFSTAYDAYS' @ Referring MD: '@ATDPROV' @     PCP:      AdmissionWeight: 62 kg                 CurrentWeight: 62.2 kg  HPI: This is an 85 year old frial, advance copd lady, well known to my service. In the ICU, copd exacerbation, anxious, requiring bipap and oxygen. Came to see in follow, had been seen by Tina [name]. She is not having associated chest pain, leg pain, minimum swelling. Asking about when she is to go home and does she need home o2.  She denies gerd, rhinitis, tightness, ectopy or fever   PMHX:   Past Medical History:  Diagnosis Date  . Anemia   . Asthma   . Bladder cancer ([place])   . BRCA positive   . CAD (coronary artery disease)   . CHF (congestive heart failure) ([place])   . COPD (chronic obstructive pulmonary disease) ([place])   . Emphysema lung ([place])   . GERD (gastroesophageal reflux disease)   . History of colon polyps   . Hypercholesterolemia   . Hyperglycemia   . Hypertension   . Lung nodules   . Osteoporosis   . Personal history of tobacco use, presenting hazards to health 11/25/2014  . Polycythemia vera(238.4)   . Renal cyst   . Skin cancer    Surgical Hx:  Past Surgical History:  Procedure Laterality Date  . ANGIOPLASTY     coronary (x1)  . COLONOSCOPY WITH PROPOFOL N/A 03/02/2015   Procedure: COLONOSCOPY WITH PROPOFOL;  Surgeon: Lollie Sails, MD;  Location: [facility] ENDOSCOPY;  Service: Endoscopy;  Laterality: N/A;  . COLONOSCOPY WITH PROPOFOL N/A 02/13/2017   Procedure: COLONOSCOPY WITH PROPOFOL;  Surgeon: [name], MD;  Location: [facility] ENDOSCOPY;  Service: Endoscopy;  Laterality: N/A;  . CORONARY ANGIOPLASTY    . CORONARY ARTERY BYPASS GRAFT  09/17/2017   pt denies  . CYSTOSCOPY W/ RETROGRADES Bilateral 02/13/2017   Procedure: CYSTOSCOPY WITH RETROGRADE PYELOGRAM;  Surgeon: Hollice Espy, MD;  Location: [facility] ORS;  Service: Urology;  Laterality: Bilateral;  . CYSTOSCOPY W/ RETROGRADES Bilateral 09/19/2017   Procedure: CYSTOSCOPY WITH RETROGRADE PYELOGRAM;  Surgeon: Hollice Espy, MD;  Location: [facility] ORS;  Service: Urology;  Laterality: Bilateral;  . CYSTOSCOPY WITH BIOPSY  03/26/2017   Procedure: CYSTOSCOPY WITH BIOPSY;  Surgeon: Hollice Espy, MD;  Location: [facility] ORS;  Service: Urology;;  . Consuela Mimes WITH STENT PLACEMENT Bilateral 02/13/2017   Procedure: CYSTOSCOPY WITH STENT PLACEMENT;  Surgeon: Hollice Espy, MD;  Location: [facility] ORS;  Service: Urology;  Laterality: Bilateral;  . CYSTOSCOPY WITH STENT PLACEMENT Bilateral 03/26/2017   Procedure: CYSTOSCOPY WITH STENT EXCHANGE;  Surgeon: Hollice Espy, MD;  Location: [facility] ORS;  Service: Urology;  Laterality: Bilateral;  . ESOPHAGOGASTRODUODENOSCOPY (EGD) WITH PROPOFOL N/A 02/13/2017   Procedure: ESOPHAGOGASTRODUODENOSCOPY (EGD) WITH PROPOFOL;  Surgeon: [name], MD;  Location[facility] END[facility]COPY;  Service: Endoscopy;  Laterality: N/A;  . PORTA CATH INSERTION N/A 02/28/2017   Procedure: PORTA CATH INSERTION;  Surgeon: Algernon Huxley, MD;  Location: [place] CV LAB;  Service: Cardiovascular;  Laterality: N/A;  . PORTA CATH REMOVAL N/A 12/24/2017   Procedure: PORTA CATH REMOVAL;  Surgeon: Algernon Huxley, MD;  Location: [place] CV LAB;  Service: Cardiovascular;  Laterality: N/A;  . SALPINGOOPHORECTOMY    .  TONSILECTOMY/ADENOIDECTOMY WITH MYRINGOTOMY    . TONSILLECTOMY    . TRANSURETHRAL RESECTION OF BLADDER TUMOR N/A 02/13/2017   Procedure: TRANSURETHRAL RESECTION OF BLADDER TUMOR (TURBT);  Surgeon: Hollice Espy, MD;  Location: [facility] ORS;  Service: Urology;  Laterality: N/A;  . TUBAL LIGATION    . UPPER GI ENDOSCOPY  02/13/2017  . URETEROSCOPY Left 09/19/2017   Procedure: URETEROSCOPY;  Surgeon: Hollice Espy, MD;  Location: [facility] ORS;  Service: Urology;  Laterality: Left;  Marland Kitchen VISCERAL ANGIOGRAPHY N/A 01/23/2019    Procedure: VISCERAL ANGIOGRAPHY;  Surgeon: Algernon Huxley, MD;  Location: [place] CV LAB;  Service: Cardiovascular;  Laterality: N/A;  . VISCERAL ANGIOGRAPHY N/A 06/09/2019   Procedure: VISCERAL ANGIOGRAPHY;  Surgeon: Algernon Huxley, MD;  Location: [place] CV LAB;  Service: Cardiovascular;  Laterality: N/A;  . VISCERAL ARTERY INTERVENTION N/A 02/12/2017   Procedure: VISCERAL ARTERY INTERVENTION;  Surgeon: Algernon Huxley, MD;  Location: [place] CV LAB;  Service: Cardiovascular;  Laterality: N/A;   Family Hx:  Family History  Problem Relation Age of Onset  . Cirrhosis Father        died age 40  . Alcohol abuse Father   . Asthma Mother   . Congestive Heart Failure Mother   . Breast cancer Mother        2 (1/2 sisters)  . Osteoarthritis Mother   . Colon cancer Mother   . Lupus Sister   . Alcohol abuse Sister   . Ovarian cancer Sister   . Osteoporosis Sister   . Skin cancer Sister   . Breast cancer Cousin   . Breast cancer Maternal Aunt    Social Hx:   Social History   Tobacco Use  . Smoking status: Former Smoker    Packs/day: 1.00    Years: 45.00    Pack years: 45.00    Types: Cigarettes    Quit date: 11/11/2003    Years since quitting: 15.7  . Smokeless tobacco: Never Used  Substance Use Topics  . Alcohol use: Not Currently    Alcohol/week: 0.0 standard drinks  . Drug use: No   Medication:    Home Medication:    Current Medication: '@CURMEDTAB' @   Allergies:  Evista [raloxifene] and Fluticasone-salmeterol  Review of Systems: Gen:  Denies  fever, sweats, chills HEENT: Denies blurred vision, double vision, ear pain, eye pain, hearing loss, nose bleeds, sore throat Cvc:  No dizziness, chest pain or heaviness Resp:  Very sob with minimum activity  Gi: Denies swallowing difficulty, stomach pain, nausea or vomiting, diarrhea, constipation, bowel incontinence Gu:  Denies bladder incontinence, burning urine Ext:   No Joint pain, stiffness or swelling Skin: No  skin rash, easy bruising or bleeding or hives Endoc:  No polyuria, polydipsia , polyphagia or weight change Psych: No depression, insomnia or hallucinations  Other:  All other systems negative  Physical Examination:   VS: BP (!) 147/79   Pulse 94   Temp 97.6 F (36.4 C) (Axillary)   Resp (!) 29   Ht '5\' 5"'  (1.651 m)   Wt 62.2 kg   SpO2 92%   BMI 22.82 kg/m   General Appearance: mild distress family in room, [place] o2 in place at 5 liters Neuro: without focal findings, mental status, speech normal, alert and oriented, cranial nerves 2-12 intact, reflexes normal and symmetric, sensation grossly normal  HEENT: PERRLA, EOM intact, no ptosis, no other lesions noticed, Mallampati: Pulmonary:.no wheezing, No rales  Mild use of accessory muscles   Cardiovascular:  Normal S1,S2.  No m/r/g.  Abdominal aorta pulsation normal.    Abdomen:Benign, Soft, non-tender, No masses, hepatosplenomegaly, No lymphadenopathy Endoc: No evident thyromegaly, no signs of acromegaly or Cushing features Skin:   warm, no rashes, no ecchymosis  Extremities: normal, no cyanosis, clubbing, trace  edema, + trophic changes,  Other findings:   Labs results:   Recent Labs    08/16/19 0459 08/17/19 0354 08/18/19 0452  HGB 12.0 12.4 12.2  HCT 35.6* 36.0 35.0*  MCV 90.6 89.1 89.3  WBC 18.0* 18.6* 16.0*  BUN 38* 39* 35*  CREATININE 1.20* 1.14* 0.96  GLUCOSE 133* 180* 161*  CALCIUM 9.1 9.2 8.8*  ,  EKG:     Other:   Assessment and Plan:  Stage III copd,dnr, Acute on chronic dyspnea, today resisting bipap earlier, strong presence of anxiety.   -continue sameregimen -trial of low dose benzo as you doing, ? Low dose ms sometimes help also if the benzo not working to help decrease the sensation of dyspnea -palliative is reasonable -will revisit tomorrow -On d/c evaluate for home o2 if indicated -ck phos -follow up in 64month  Polycythemia vera, stable Hematology following  Hx of bladdercancer, s/p  chemo/xrt, in remission Following oncology recs  Mild renal insufficiency ( cr1.2) cautious diuresis Bmp in am Avoid nephrotoxic meds    I have personally obtained a history, examined the patient, evaluated laboratory and imaging results, formulated the assessment and plan and placed orders.  The Patient requires high complexity decision making for assessment and support, frequent evaluation and titration of therapies, application of advanced monitoring technologies and extensive interpretation of multiple databases.   Lucresha Dismuke,M.D. Pulmonary & Critical care Medicine [facility]

## 2019-08-18 NOTE — Progress Notes (Signed)
PROGRESS NOTE    Tina [name]  JQB:[id number] DOB: 10-11-1934 DOA: 08/13/2019 PCP: Einar Pheasant, MD    Assessment & Plan:   Active Problems:   CAD (coronary artery disease)   COPD, severe (HCC)   COPD exacerbation (HCC)   Essential hypertension   CKD (chronic kidney disease), stage III (HCC)   CHF (congestive heart failure) (HCC)   Acute on chronic diastolic CHF (congestive heart failure) (HCC)   Dyspnea   Pleural effusion, left   Peripheral neuropathy   History of pulmonary embolism    Tina Harper Viersis a 85 y.o.Caucasian femalewith medical history significant forCOPD,hypertension,diastolic CHF,CAD, CKD 3b whopresented with persistent dyspnea.   # Dyspnea 2/2 COPD exacerbation # Chronic dyspnea # Severe COPD --Able to maintain O2 sat, however, has increased RR and visibly uncomfortable. Presented with WBC 20.6, though no fever, or consolidation seen on CXR.  Did have mod left pleural effusion, but didn't seem large enough to cause this amount of dyspnea.  Was recently hospitalized for COPD exacerbation only about a week ago, and finished 5 days of prednisone 40 mg daily, maybe needs a longer course or taper? --neg D-dimer, procal 0.15 --Based on overnight pulse ox patient does not qualify for nocturnal O2 PLAN: --Pulm consult on 4/1, recommend below: --continue steroid as IV solumedrol 20 mg daily --patient will likely require prolonged steroid taper on d/c from 40 to 5mg  over period of 14days --DuoNeb q4h scheduled --continue pulmicort --continue azithromycin 500 mg daily --Roflumilast 222mcg po  - IS and flutter at bedside --MetaNeb with saline for recruitment of LLL atelectasis -PT/OT -outpatient pulmonary rehab --Pt's outpatient pulmonologist Dr. Raul Del to see pt on Monday --Palliative consult  --IV ativan PRN for anxiety  Hx of Pulmonary embolism - finished 6 months of anticoagulation  --D-dimer neg during this admission.  Acute on Chronic  diastolic CHF exacerbation --Last TTE on 08/03/19 showed LVEF 50-55% with grade I diastolic dysfunction. Pt was discharged with PRN torsemide due to AKI.  CXR on presentation didn't show pulm edema, however, did have mod pleural effusion. --BNP 1107 --s/p IV lasix 40 mg daily for 2 days, and already Cr bumped --IV Lasix 40 mg x1 f/b lasix gtt@4  on 4/4 PLAN: --continue lasix gtt --Strict I/O  --Low sodium diet  Mod left pleural effusion --increased since 08/02/19.  Maybe contributing to dyspnea. --Thoracentesis 4/1 with 300 cc removed, transudate, but then it re-accumulated. --continue diuresis  CKD 3b --Cr 1.09 on presentation, which is baseline.  Uncontrolled HTN --was discharged on increase hydralazine to100mg  TID, metop to100mg  BID and home amlodipine (home losartan held due to AKI), and noted to have elevated BP at home. PLAN: --continue home amlodipine, hydralazine --continue labetalol (switched from home metop) --IV hydralazine PRN --renal artery duplex tomorrow (NPO MN)  Historycoronary artery disease Continue statin continue aspirin, Plavix  Peripheral neuropathy and gait dysfunction --continue home gabapentin   DVT prophylaxis: Lovenox SQ Code Status: DNR  Family Communication: Daughter updated at bedside today Disposition Plan: Undetermined when, since pt is currently in stepdown and on BiPAP.     Subjective and Interval History:  Pt has been diuresing well with improved Cr, however, still complained of dyspnea, and now BiPAP isn't helping anymore.  No fever, chest pain, abdominal pain, N/V/D, dysuria.  Pt's outpatient pulm Dr. Raul Del to see pt today.   Objective: Vitals:   08/18/19 1650 08/18/19 1700 08/18/19 1730 08/18/19 1800  BP:   (!) 169/93 (!) 155/58  Pulse: 92 94 98 91  Resp: (!) 26 (!) 29 (!) 23 (!) 23  Temp:      TempSrc:      SpO2: 94% 92% 94% (!) 87%  Weight:      Height:        Intake/Output Summary (Last 24 hours) at  08/18/2019 1842 Last data filed at 08/18/2019 1800 Gross per 24 hour  Intake 444.04 ml  Output 750 ml  Net -305.96 ml   Filed Weights   08/13/19 1228 08/16/19 2313  Weight: 62 kg 62.2 kg    Examination:   Constitutional: NAD, AAOx3 HEENT: conjunctivae and lids normal, EOMI CV: RRR no M,R,G. Distal pulses +2.  No cyanosis.   RESP: No wheezing, on BiPAP GI: +BS, NTND Extremities: Edema in BLE resolved SKIN: warm, dry, extensive bruising on both lower legs Neuro: II - XII grossly intact.  Sensation intact   Data Reviewed: I have personally reviewed following labs and imaging studies  CBC: Recent Labs  Lab 08/13/19 1234 08/13/19 1234 08/14/19 0544 08/15/19 0544 08/16/19 0459 08/17/19 0354 08/18/19 0452  WBC 20.6*   < > 10.7* 15.7* 18.0* 18.6* 16.0*  NEUTROABS 16.5*  --   --   --   --   --   --   HGB 13.7   < > 12.7 12.4 12.0 12.4 12.2  HCT 40.5   < > 37.1 36.2 35.6* 36.0 35.0*  MCV 91.0   < > 89.8 89.6 90.6 89.1 89.3  PLT 217   < > 196 188 183 177 161   < > = values in this interval not displayed.   Basic Metabolic Panel: Recent Labs  Lab 08/14/19 0544 08/15/19 0544 08/16/19 0459 08/17/19 0354 08/18/19 0452  NA 135 134* 134* 136 140  K 3.5 3.7 3.7 4.3 3.2*  CL 98 96* 97* 98 96*  CO2 27 27 28 27  33*  GLUCOSE 158* 166* 133* 180* 161*  BUN 25* 42* 38* 39* 35*  CREATININE 1.16* 1.39* 1.20* 1.14* 0.96  CALCIUM 9.0 8.9 9.1 9.2 8.8*  MG 2.4 2.4 2.6* 2.5* 2.5*   GFR: Estimated Creatinine Clearance: 40.7 mL/min (by C-G formula based on SCr of 0.96 mg/dL). Liver Function Tests: No results for input(s): AST, ALT, ALKPHOS, BILITOT, PROT, ALBUMIN in the last 168 hours. No results for input(s): LIPASE, AMYLASE in the last 168 hours. No results for input(s): AMMONIA in the last 168 hours. Coagulation Profile: No results for input(s): INR, PROTIME in the last 168 hours. Cardiac Enzymes: No results for input(s): CKTOTAL, CKMB, CKMBINDEX, TROPONINI in the last 168  hours. BNP (last 3 results) No results for input(s): PROBNP in the last 8760 hours. HbA1C: No results for input(s): HGBA1C in the last 72 hours. CBG: Recent Labs  Lab 08/16/19 2310  GLUCAP 205*   Lipid Profile: No results for input(s): CHOL, HDL, LDLCALC, TRIG, CHOLHDL, LDLDIRECT in the last 72 hours. Thyroid Function Tests: No results for input(s): TSH, T4TOTAL, FREET4, T3FREE, THYROIDAB in the last 72 hours. Anemia Panel: No results for input(s): VITAMINB12, FOLATE, FERRITIN, TIBC, IRON, RETICCTPCT in the last 72 hours. Sepsis Labs: Recent Labs  Lab 08/13/19 1949  PROCALCITON 0.15    Recent Results (from the past 240 hour(s))  SARS CORONAVIRUS 2 (TAT 6-24 HRS) Nasopharyngeal Nasopharyngeal Swab     Status: None   Collection Time: 08/13/19  2:46 PM   Specimen: Nasopharyngeal Swab  Result Value Ref Range Status   SARS Coronavirus 2 NEGATIVE NEGATIVE Final    Comment: (NOTE) SARS-CoV-2 target nucleic  acids are NOT DETECTED. The SARS-CoV-2 RNA is generally detectable in upper and lower respiratory specimens during the acute phase of infection. Negative results do not preclude SARS-CoV-2 infection, do not rule out co-infections with other pathogens, and should not be used as the sole basis for treatment or other patient management decisions. Negative results must be combined with clinical observations, patient history, and epidemiological information. The expected result is Negative. Fact Sheet for Patients: SugarRoll.be Fact Sheet for Healthcare Providers: https://www.woods-mathews.com/ This test is not yet approved or cleared by the [place] FDA and  has been authorized for detection and/or diagnosis of SARS-CoV-2 by FDA under an Emergency Use Authorization (EUA). This EUA will remain  in effect (meaning this test can be used) for the duration of the COVID-19 declaration under Section 56 4(b)(1) of the Act, 21 U.S.C. section  360bbb-3(b)(1), unless the authorization is terminated or revoked sooner. Performed at [facility], [place] [address], [place], [place] [address]   MRSA PCR Screening     Status: None   Collection Time: 08/16/19 11:05 PM   Specimen: Nasopharyngeal  Result Value Ref Range Status   MRSA by PCR NEGATIVE NEGATIVE Final    Comment:        The GeneXpert MRSA Assay (FDA approved for NASAL specimens only), is one component of a comprehensive MRSA colonization surveillance program. It is not intended to diagnose MRSA infection nor to guide or monitor treatment for MRSA infections. Performed at [facility], [address], [place], [place] [address]       Radiology Studies: No results found.   Scheduled Meds: . amLODipine  10 mg Oral Daily  . aspirin EC  81 mg Oral Daily  . azithromycin  500 mg Oral Daily  . budesonide (PULMICORT) nebulizer solution  0.5 mg Nebulization BID  . Chlorhexidine Gluconate Cloth  6 each Topical Daily  . clopidogrel  75 mg Oral Daily  . diazepam  1 mg Oral Once  . enoxaparin (LOVENOX) injection  40 mg Subcutaneous Q24H  . gabapentin  200 mg Oral QHS  . hydrALAZINE  100 mg Oral TID  . ipratropium-albuterol  3 mL Nebulization Q4H  . labetalol  200 mg Oral BID  . methylPREDNISolone (SOLU-MEDROL) injection  20 mg Intravenous Daily  . pravastatin  40 mg Oral q1800  . roflumilast  250 mcg Oral Daily   Continuous Infusions: . furosemide (LASIX) infusion 4 mg/hr (08/18/19 1800)     LOS: 5 days     Enzo Bi, MD [facility] If 7PM-7AM, please contact night-coverage 08/18/2019, 6:42 PM

## 2019-08-18 NOTE — Progress Notes (Signed)
RN received call from [name] in reference to renal artery [name]. Pt has not been NPO and has eaten breakfast. May have this done tomorrow AM if NPO after midnight. Pt taken off bi-pap and placed on 4 L nasal cannula for 2 hours while eating breakfast. Pt sats 92-94% on 4 L [place] but pt became SOB and bi-pap was placed back on pt. BP 172/99 this AM but has cam down to 142/57 with morning antihypertensives. Will continue to monitor.

## 2019-08-19 ENCOUNTER — Inpatient Hospital Stay: Payer: Medicare Other

## 2019-08-19 ENCOUNTER — Encounter: Payer: Self-pay | Admitting: Hospitalist

## 2019-08-19 ENCOUNTER — Inpatient Hospital Stay: Payer: Medicare Other | Admitting: Internal Medicine

## 2019-08-19 DIAGNOSIS — J441 Chronic obstructive pulmonary disease with (acute) exacerbation: Secondary | ICD-10-CM

## 2019-08-19 DIAGNOSIS — I5033 Acute on chronic diastolic (congestive) heart failure: Secondary | ICD-10-CM

## 2019-08-19 DIAGNOSIS — Z515 Encounter for palliative care: Secondary | ICD-10-CM

## 2019-08-19 DIAGNOSIS — Z7189 Other specified counseling: Secondary | ICD-10-CM

## 2019-08-19 LAB — CBC
HCT: 37.1 % (ref 36.0–46.0)
Hemoglobin: 12.5 g/dL (ref 12.0–15.0)
MCH: 30.3 pg (ref 26.0–34.0)
MCHC: 33.7 g/dL (ref 30.0–36.0)
MCV: 90 fL (ref 80.0–100.0)
Platelets: 153 10*3/uL (ref 150–400)
RBC: 4.12 MIL/uL (ref 3.87–5.11)
RDW: 13.6 % (ref 11.5–15.5)
WBC: 15.6 10*3/uL — ABNORMAL HIGH (ref 4.0–10.5)
nRBC: 0 % (ref 0.0–0.2)

## 2019-08-19 LAB — MAGNESIUM: Magnesium: 2.7 mg/dL — ABNORMAL HIGH (ref 1.7–2.4)

## 2019-08-19 LAB — BASIC METABOLIC PANEL
Anion gap: 12 (ref 5–15)
BUN: 33 mg/dL — ABNORMAL HIGH (ref 8–23)
CO2: 33 mmol/L — ABNORMAL HIGH (ref 22–32)
Calcium: 8.5 mg/dL — ABNORMAL LOW (ref 8.9–10.3)
Chloride: 93 mmol/L — ABNORMAL LOW (ref 98–111)
Creatinine, Ser: 0.89 mg/dL (ref 0.44–1.00)
GFR calc Af Amer: >60 mL/min (ref >60)
GFR calc non Af Amer: >60 mL/min (ref >60)
Glucose, Bld: 140 mg/dL — ABNORMAL HIGH (ref 70–99)
Potassium: 3.2 mmol/L — ABNORMAL LOW (ref 3.5–5.1)
Sodium: 138 mmol/L (ref 135–145)

## 2019-08-19 LAB — PHOSPHORUS: Phosphorus: 2.7 mg/dL (ref 2.5–4.6)

## 2019-08-19 MED ORDER — POTASSIUM CHLORIDE CRYS ER 20 MEQ PO TBCR
40.0000 meq | EXTENDED_RELEASE_TABLET | Freq: Once | ORAL | Status: AC
  Administered 2019-08-19: 40 meq via ORAL
  Filled 2019-08-19: qty 2

## 2019-08-19 MED ORDER — FUROSEMIDE 10 MG/ML IJ SOLN: 40.0000 mg | Freq: Once | INTRAMUSCULAR | Status: DC

## 2019-08-19 NOTE — Consult Note (Signed)
Consultation Note Date: 08/19/2019   Patient Name: Tina [name]  DOB: [birth year]  MRN: [id number]  Age / Sex: 85 y.o., female  PCP: Einar Pheasant, MD Referring Physician: Enzo Bi, MD  Reason for Consultation: Establishing goals of care and Psychosocial/spiritual support  HPI/Patient Profile: 85 y.o. female  with past medical history of CAD, COPD, GERD, HTN/HLD, Bladder cancer, Lung nodules, osteoporosis, 3 hospitalizations in 6 months admitted on 08/13/2019 with COPD stage 3 exacerbation.   Clinical Assessment and Goals of Care: I have reviewed medical records including EPIC notes, labs and imaging, received report from bedside nursing staff, examined the patient and met at bedside with sister Silva Bandy to discuss diagnosis prognosis, [place], EOL wishes, disposition and options.  I introduced Palliative Medicine as specialized medical care for people living with serious illness. It focuses on providing relief from the symptoms and stress of a serious illness.  We talk about the use of ativan, which she tells me that she feels helps her, and the use of morphine for breathlessness.  She is agreeable to try morphine, but I'm not sure who will write preseriptions for these ongoing without out patient palliative services.   As far as functional and nutritional status, Mrs. Loseke tells me that she is independent with IADLs/ADLs.  She tells me that she is able to drive, get groceries and manage household bills.  Her adult son lives in her home, but does not help with household chores.   Mrs. Kizer tells me that she will take [facility] services and she would like to have [facility], has used them in the past.   We discussed her current illness and what it means in the larger context of her on-going co-morbidities.  Natural disease trajectory and expectations at EOL were discussed.    Advanced directives, concepts specific to  code status,  were considered and discussed.  Mrs. Raabe tells me that she wants treat the treatable care, but no life support.  She tells me that her family is aware of her wishes.    Hospice and Palliative Care services outpatient were explained and offered.  We talk about out patient palliative services, the benefits of early intake.  We also talk about hospice services and symptom management.  Mrs. Deshazer tells me "I don't think I'm ready for that" (Palliative care).  Sister, Silva Bandy is at bedside and is shaking her head.  I share that Silva Bandy seems to feel that palliative would be a good idea, and she states agreement.    Questions and concerns were addressed.  The family was encouraged to call with questions or concerns.    HCPOA   NEXT OF KIN - Mrs. Orrick names her daughter Atlee Abide as her health care surrogate.  She has a son who lives in her home also. She has 1 living sister who is supportive, Silva Bandy.    SUMMARY OF RECOMMENDATIONS   NO CPR or intubation FULL scope treatment otherwise.  Declines out patient palliative services at this time. Agreeable to [facility]  services.    Code Status/Advance Care Planning:  DNR - verified with patient and sister Silva Bandy.   Symptom Management:   Per hospitalist/pulmonology  Recommend Morphine 2.6 mg PO Q 4 hours PRN.  Once established, over 2-3 days of PRN use, calculate long acting dose BID  Palliative Prophylaxis:   Frequent Pain Assessment  Additional Recommendations (Limitations, Scope, Preferences):  treat the treatable, but no CPR or intubation.   Psycho-social/Spiritual:   Desire for further Chaplaincy support:no  Additional Recommendations: Caregiving  Support/Resources and Education on Hospice  Prognosis:   Unable to determine, based on outcomes.  Prognostication for end stage lung disease can be difficult.   Discharge Planning: home with [facility], asking for [facility] services.       Primary Diagnoses: Present  on Admission: . Dyspnea . COPD, severe ([place]) . CAD (coronary artery disease) . COPD exacerbation ([place]) . Essential hypertension . Acute on chronic diastolic CHF (congestive heart failure) ([place]) . CKD (chronic kidney disease), stage III ([place]) . Pleural effusion, left . History of pulmonary embolism   I have reviewed the medical record, interviewed the patient and family, and examined the patient. The following aspects are pertinent.  Past Medical History:  Diagnosis Date  . Anemia   . Asthma   . Bladder cancer ([place])   . BRCA positive   . CAD (coronary artery disease)   . CHF (congestive heart failure) ([place])   . COPD (chronic obstructive pulmonary disease) ([place])   . Emphysema lung ([place])   . GERD (gastroesophageal reflux disease)   . History of colon polyps   . Hypercholesterolemia   . Hyperglycemia   . Hypertension   . Lung nodules   . Osteoporosis   . Personal history of tobacco use, presenting hazards to health 11/25/2014  . Polycythemia vera(238.4)   . Renal cyst   . Skin cancer    Social History   Socioeconomic History  . Marital status: Widowed    Spouse name: Not on file  . Number of children: 2  . Years of education: Not on file  . Highest education level: Not on file  Occupational History  . Not on file  Tobacco Use  . Smoking status: Former Smoker    Packs/day: 1.00    Years: 45.00    Pack years: 45.00    Types: Cigarettes    Quit date: 11/11/2003    Years since quitting: 15.7  . Smokeless tobacco: Never Used  Substance and Sexual Activity  . Alcohol use: Not Currently    Alcohol/week: 0.0 standard drinks  . Drug use: No  . Sexual activity: Never  Other Topics Concern  . Not on file  Social History Narrative  . Not on file   Social Determinants of Health   Financial Resource Strain:   . Difficulty of Paying Living Expenses:   Food Insecurity: No Food Insecurity  . Worried About Charity fundraiser in the Last Year: Never true  . Ran Out of  Food in the Last Year: Never true  Transportation Needs: No Transportation Needs  . Lack of Transportation (Medical): No  . Lack of Transportation (Non-Medical): No  Physical Activity:   . Days of Exercise per Week:   . Minutes of Exercise per Session:   Stress:   . Feeling of Stress :   Social Connections:   . Frequency of Communication with Friends and Family:   . Frequency of Social Gatherings with Friends and Family:   . Attends Religious Services:   .  Active Member of Clubs or Organizations:   . Attends Archivist Meetings:   Marland Kitchen Marital Status:    Family History  Problem Relation Age of Onset  . Cirrhosis Father        died age 29  . Alcohol abuse Father   . Asthma Mother   . Congestive Heart Failure Mother   . Breast cancer Mother        2 (1/2 sisters)  . Osteoarthritis Mother   . Colon cancer Mother   . Lupus Sister   . Alcohol abuse Sister   . Ovarian cancer Sister   . Osteoporosis Sister   . Skin cancer Sister   . Breast cancer Cousin   . Breast cancer Maternal Aunt    Scheduled Meds: . amLODipine  10 mg Oral Daily  . aspirin EC  81 mg Oral Daily  . azithromycin  500 mg Oral Daily  . budesonide (PULMICORT) nebulizer solution  0.5 mg Nebulization BID  . Chlorhexidine Gluconate Cloth  6 each Topical Daily  . clopidogrel  75 mg Oral Daily  . diazepam  1 mg Oral Once  . enoxaparin (LOVENOX) injection  40 mg Subcutaneous Q24H  . gabapentin  200 mg Oral QHS  . hydrALAZINE  100 mg Oral TID  . ipratropium-albuterol  3 mL Nebulization TID  . labetalol  200 mg Oral BID  . methylPREDNISolone (SOLU-MEDROL) injection  20 mg Intravenous Daily  . pravastatin  40 mg Oral q1800  . roflumilast  250 mcg Oral Daily   Continuous Infusions: . furosemide (LASIX) infusion 4 mg/hr (08/18/19 1800)   PRN Meds:.acetaminophen, albuterol, alum & mag hydroxide-simeth, calcium carbonate, docusate sodium, guaiFENesin-dextromethorphan, hydrALAZINE, LORazepam, ondansetron  (ZOFRAN) IV, ondansetron, polyethylene glycol Medications Prior to Admission:  Prior to Admission medications   Medication Sig Start Date End Date Taking? Authorizing Provider  albuterol (PROVENTIL HFA;VENTOLIN HFA) 108 (90 Base) MCG/ACT inhaler Inhale 2 puffs into the lungs every 6 (six) hours as needed for wheezing or shortness of breath. 10/22/17  Yes Mody, Sital, MD  amLODipine (NORVASC) 5 MG tablet TAKE 1 TABLET BY MOUTH  TWICE DAILY Patient taking differently: Take 5 mg by mouth 2 (two) times daily.  03/18/19  Yes Einar Pheasant, MD  aspirin EC 81 MG tablet Take 1 tablet (81 mg total) by mouth daily. 01/23/19  Yes Dew, Erskine Squibb, MD  cholecalciferol (VITAMIN D) 1000 units tablet Take 1,000 Units by mouth daily.   Yes [provider]  clopidogrel (PLAVIX) 75 MG tablet Take 1 tablet (75 mg total) by mouth daily. 03/29/18  Yes Einar Pheasant, MD  cyanocobalamin 1000 MCG tablet Take 2,000 mcg by mouth daily.   Yes [provider]  fluticasone (FLONASE) 50 MCG/ACT nasal spray USE 2 SPRAYS INTO BOTH  NOSTRILS DAILY AS NEEDED  FOR ALLERGIES. Patient taking differently: Place 2 sprays into both nostrils daily.  10/01/18  Yes Einar Pheasant, MD  gabapentin (NEURONTIN) 100 MG capsule Take 2 capsules by mouth at bedtime. 06/02/19  Yes [provider]  hydrALAZINE (APRESOLINE) 100 MG tablet Take 1 tablet (100 mg total) by mouth 3 (three) times daily. Patient taking differently: Take 100 mg by mouth 3 (three) times daily. (take with 42m doses for total 1560mdoses) 08/06/19  Yes GrNicole Kindred, DO  hydrALAZINE (APRESOLINE) 50 MG tablet Take 50 mg by mouth 3 (three) times daily. (take with 100106mablets for 150m37mtal doses)   Yes [provider]  ipratropium-albuterol (DUONEB) 0.5-2.5 (3) MG/3ML SOLN  Take 3 mLs by nebulization every 6 (six) hours as needed (for shortness of breath). 11/14/14  Yes Joanne Gavel, MD  lovastatin (MEVACOR) 40 MG tablet TAKE 1 TABLET BY MOUTH   DAILY IN THE EVENING Patient taking differently: Take 40 mg by mouth every evening.  06/25/19  Yes Einar Pheasant, MD  metoprolol tartrate (LOPRESSOR) 100 MG tablet Take 1 tablet (100 mg total) by mouth 2 (two) times daily. 08/06/19  Yes Nicole Kindred A, DO  SYMBICORT 160-4.5 MCG/ACT inhaler USE 2 PUFFS BY MOUTH TWO  TIMES DAILY Patient taking differently: Inhale 2 puffs into the lungs in the morning and at bedtime.  06/30/19  Yes Einar Pheasant, MD  torsemide (DEMADEX) 20 MG tablet Take 1 tablet (20 mg total) by mouth daily as needed (swelling or shortness of breath). 08/06/19  Yes Nicole Kindred A, DO   Allergies  Allergen Reactions  . Evista [Raloxifene] Other (See Comments)    Night sweats   . Fluticasone-Salmeterol Other (See Comments)    Cough, "chokes me"    Review of Systems  Unable to perform ROS: Age    Physical Exam Vitals and nursing note reviewed.  Constitutional:      General: She is not in acute distress.    Appearance: She is normal weight. She is ill-appearing.  Cardiovascular:     Rate and Rhythm: Normal rate.  Pulmonary:     Effort: Pulmonary effort is normal. No tachypnea.  Abdominal:     Palpations: Abdomen is soft.     Tenderness: There is no guarding.  Musculoskeletal:     Right lower leg: No edema.     Left lower leg: No edema.  Skin:    General: Skin is warm and dry.  Neurological:     Mental Status: She is alert and oriented to person, place, and time.  Psychiatric:        Mood and Affect: Mood normal.        Behavior: Behavior normal.     Vital Signs: BP (!) 143/77   Pulse (!) 108   Temp 98 F (36.7 C) (Oral)   Resp 17   Ht '5\' 5"'  (1.651 m)   Wt 62.2 kg   SpO2 98%   BMI 22.82 kg/m  Pain Scale: 0-10 POSS *See Group Information*: 1-Acceptable,Awake and alert Pain Score: 0-No pain   SpO2: SpO2: 98 % O2 Device:SpO2: 98 % O2 Flow Rate: .O2 Flow Rate (L/min): 4 L/min  IO: Intake/output summary:   Intake/Output Summary (Last 24  hours) at 08/19/2019 1254 Last data filed at 08/19/2019 0800 Gross per 24 hour  Intake 252.69 ml  Output 500 ml  Net -247.31 ml    LBM: Last BM Date: 08/18/19 Baseline Weight: Weight: 62 kg Most recent weight: Weight: 62.2 kg     Palliative Assessment/Data:   Flowsheet Rows     Most Recent Value  Intake Tab  Referral Department  Hospitalist  Unit at Time of Referral  Intermediate Care Unit  Palliative Care Primary Diagnosis  Pulmonary  Date Notified  08/18/19  Palliative Care Type  New Palliative care  Reason for referral  Clarify Goals of Care  Date of Admission  08/13/19  Date first seen by Palliative Care  08/19/19  # of days Palliative referral response time  1 Day(s)  # of days IP prior to Palliative referral  5  Clinical Assessment  Palliative Performance Scale Score  40%  Pain Max last 24 hours  Not able to report  Pain Min Last 24 hours  Not able to report  Dyspnea Max Last 24 Hours  Not able to report  Dyspnea Min Last 24 hours  Not able to report  Psychosocial & Spiritual Assessment  Palliative Care Outcomes      Time In: 0910 Time Out: 1020 Time Total: 70 minutes  Greater than 50%  of this time was spent counseling and coordinating care related to the above assessment and plan.  Signed by: Drue Novel, NP   Please contact Palliative Medicine Team phone at [phone] for questions and concerns.  For individual provider: See Shea Evans

## 2019-08-19 NOTE — Progress Notes (Signed)
Date: 08/19/2019,   MRN# [id number] Tina [name] 1934-06-19 Code Status:     Code Status Orders  (From admission, onward)         Start     Ordered   08/13/19 1827  Do not attempt resuscitation (DNR)  Continuous    Question Answer Comment  In the event of cardiac or respiratory ARREST Do not call a "code blue"   In the event of cardiac or respiratory ARREST Do not perform Intubation, CPR, defibrillation or ACLS   In the event of cardiac or respiratory ARREST Use medication by any route, position, wound care, and other measures to relive pain and suffering. May use oxygen, suction and manual treatment of airway obstruction as needed for comfort.   Comments Code status was discussed with patient and she wishes to be placed on a DNR status      08/13/19 1826        Code Status History    Date Active Date Inactive Code Status Order ID Comments User Context   08/02/2019 1627 08/06/2019 2158 DNR [id number]  Collier Bullock, MD ED   08/02/2019 1547 08/02/2019 1627 Full Code [id number]  Collier Bullock, MD ED   05/10/2019 1747 05/14/2019 0108 DNR [id number]  Lorella Nimrod, MD ED   10/21/2017 1101 10/22/2017 1529 DNR [id number]  Bettey Costa, MD Inpatient   10/20/2017 1405 10/21/2017 1101 Full Code [id number]  Idelle Crouch, MD Inpatient   02/11/2017 2018 02/15/2017 1534 DNR [id number]  Fritzi Mandes, MD Inpatient   08/21/2016 2204 08/23/2016 1511 Full Code [id number]  Henreitta Leber, MD Inpatient   06/14/2016 2028 06/17/2016 1621 Full Code [id number]  Theodoro Grist, MD Inpatient   11/14/2014 0857 11/15/2014 1557 Full Code [id number]  Aldean Jewett, MD Inpatient   Advance Care Planning Activity    Advance Directive Documentation     Most Recent Value  Type of Advance Directive  Healthcare Power of Attorney  Pre-existing out of facility DNR order (yellow form or pink MOST form)  --  "MOST" Form in Place?  --     Hosp day:_0 @ Referring MD: _1 @     PCP:       HPI: Less dyspnea, off  cpap. On  4liters o2, sats 95%, meds reviewed, less anxious. Palliative did see.  PMHX:   Past Medical History:  Diagnosis Date  . Anemia   . Asthma   . Bladder cancer ([place])   . BRCA positive   . CAD (coronary artery disease)   . CHF (congestive heart failure) ([place])   . COPD (chronic obstructive pulmonary disease) ([place])   . Emphysema lung ([place])   . GERD (gastroesophageal reflux disease)   . History of colon polyps   . Hypercholesterolemia   . Hyperglycemia   . Hypertension   . Lung nodules   . Osteoporosis   . Personal history of tobacco use, presenting hazards to health 11/25/2014  . Polycythemia vera(238.4)   . Renal cyst   . Skin cancer    Surgical Hx:  Past Surgical History:  Procedure Laterality Date  . ANGIOPLASTY     coronary (x1)  . COLONOSCOPY WITH PROPOFOL N/A 03/02/2015   Procedure: COLONOSCOPY WITH PROPOFOL;  Surgeon: Lollie Sails, MD;  Location[facility] ENDOSCOPY;  Service: Endoscopy;  Laterality: N/A;  . COLONOSCOPY WITH PROPOFOL N/A 02/13/2017   Procedure: COLONOSCOPY WITH PROPOFOL;  Surgeon: [name], MD;  Location: [facility] ENDOSCOPY;  Service: Endoscopy;  Laterality: N/A;  . CORONARY ANGIOPLASTY    .  CORONARY ARTERY BYPASS GRAFT  09/17/2017   pt denies  . CYSTOSCOPY W/ RETROGRADES Bilateral 02/13/2017   Procedure: CYSTOSCOPY WITH RETROGRADE PYELOGRAM;  Surgeon: Brandon, Ashley, MD;  Location: [facility] ORS;  Service: Urology;  Laterality: Bilateral;  . CYSTOSCOPY W/ RETROGRADES Bilateral 09/19/2017   Procedure: CYSTOSCOPY WITH RETROGRADE PYELOGRAM;  Surgeon: Brandon, Ashley, MD;  Location: [facility] ORS;  Service: Urology;  Laterality: Bilateral;  . CYSTOSCOPY WITH BIOPSY  03/26/2017   Procedure: CYSTOSCOPY WITH BIOPSY;  Surgeon: Brandon, Ashley, MD;  Location: [facility] ORS;  Service: Urology;;  . CYSTOSCOPY WITH STENT PLACEMENT Bilateral 02/13/2017   Procedure: CYSTOSCOPY WITH STENT PLACEMENT;  Surgeon: Brandon, Ashley, MD;  Location: [facility] ORS;  Service: Urology;   Laterality: Bilateral;  . CYSTOSCOPY WITH STENT PLACEMENT Bilateral 03/26/2017   Procedure: CYSTOSCOPY WITH STENT EXCHANGE;  Surgeon: Brandon, Ashley, MD;  Location: [facility] ORS;  Service: Urology;  Laterality: Bilateral;  . ESOPHAGOGASTRODUODENOSCOPY (EGD) WITH PROPOFOL N/A 02/13/2017   Procedure: ESOPHAGOGASTRODUODENOSCOPY (EGD) WITH PROPOFOL;  Surgeon: Wohl, Darren, MD;  Location: [facility] ENDOSCOPY;  Service: Endoscopy;  Laterality: N/A;  . PORTA CATH INSERTION N/A 02/28/2017   Procedure: PORTA CATH INSERTION;  Surgeon: Dew, Jason S, MD;  Location: [facility] INVASIVE CV LAB;  Service: Cardiovascular;  Laterality: N/A;  . PORTA CATH REMOVAL N/A 12/24/2017   Procedure: PORTA CATH REMOVAL;  Surgeon: Dew, Jason S, MD;  Location: [facility] INVASIVE CV LAB;  Service: Cardiovascular;  Laterality: N/A;  . SALPINGOOPHORECTOMY    . TONSILECTOMY/ADENOIDECTOMY WITH MYRINGOTOMY    . TONSILLECTOMY    . TRANSURETHRAL RESECTION OF BLADDER TUMOR N/A 02/13/2017   Procedure: TRANSURETHRAL RESECTION OF BLADDER TUMOR (TURBT);  Surgeon: Brandon, Ashley, MD;  Location: [facility] ORS;  Service: Urology;  Laterality: N/A;  . TUBAL LIGATION    . UPPER GI ENDOSCOPY  02/13/2017  . URETEROSCOPY Left 09/19/2017   Procedure: URETEROSCOPY;  Surgeon: Brandon, Ashley, MD;  Location: [facility] ORS;  Service: Urology;  Laterality: Left;  . VISCERAL ANGIOGRAPHY N/A 01/23/2019   Procedure: VISCERAL ANGIOGRAPHY;  Surgeon: Dew, Jason S, MD;  Location: [facility] INVASIVE CV LAB;  Service: Cardiovascular;  Laterality: N/A;  . VISCERAL ANGIOGRAPHY N/A 06/09/2019   Procedure: VISCERAL ANGIOGRAPHY;  Surgeon: Dew, Jason S, MD;  Location: [facility] INVASIVE CV LAB;  Service: Cardiovascular;  Laterality: N/A;  . VISCERAL ARTERY INTERVENTION N/A 02/12/2017   Procedure: VISCERAL ARTERY INTERVENTION;  Surgeon: Dew, Jason S, MD;  Location: [facility] INVASIVE CV LAB;  Service: Cardiovascular;  Laterality: N/A;   Family Hx:  Family History  Problem Relation Age of Onset  . Cirrhosis  Father        died age 52  . Alcohol abuse Father   . Asthma Mother   . Congestive Heart Failure Mother   . Breast cancer Mother        2 (1/2 sisters)  . Osteoarthritis Mother   . Colon cancer Mother   . Lupus Sister   . Alcohol abuse Sister   . Ovarian cancer Sister   . Osteoporosis Sister   . Skin cancer Sister   . Breast cancer Cousin   . Breast cancer Maternal Aunt    Social Hx:   Social History   Tobacco Use  . Smoking status: Former Smoker    Packs/day: 1.00    Years: 45.00    Pack years: 45.00    Types: Cigarettes    Quit date: 11/11/2003    Years since quitting: 15.7  . Smokeless tobacco: Never Used  Substance Use Topics  . Alcohol   use: Not Currently    Alcohol/week: 0.0 standard drinks  . Drug use: No   Medication:    Home Medication:    Current Medication: _0 @   Allergies:  Evista [raloxifene] and Fluticasone-salmeterol  Review of Systems: Gen:  Denies  fever, sweats, chills HEENT: Denies blurred vision, double vision, ear pain, eye pain, hearing loss, nose bleeds, sore throat Cvc:  No dizziness, chest pain or heaviness Resp:   Less dyspnea. Off bipap.  Gi: Denies swallowing difficulty, stomach pain, nausea or vomiting, diarrhea, constipation, bowel incontinence Gu:  Denies bladder incontinence, burning urine Ext:   No Joint pain, stiffness or swelling Skin: No skin rash, easy bruising or bleeding or hives Endoc:  No polyuria, polydipsia , polyphagia or weight change Psych: No depression, insomnia or hallucinations  Other:  All other systems negative  Physical Examination:   VS: BP (!) 183/69 (BP Location: Left Arm)   Pulse 79   Temp 98.5 F (36.9 C) (Oral)   Resp (!) 21   Ht 5' 5" (1.651 m)   Wt 62.2 kg   SpO2 94%   BMI 22.82 kg/m   General Appearance: No distress 4 Lliters [place] 02, no use of accessory muscles, smiling. Family in room Neuro: without focal findings, mental status, speech normal, alert and oriented, cranial nerves  2-12 intact, reflexes normal and symmetric, sensation grossly normal  HEENT: PERRLA, EOM intact, no ptosis, no other lesions noticedi: Pulmonary:.No wheezing, No rales  NO Sputum Production:   Cardiovascular:  Normal S1,S2.  No m/r/g.  Abdominal aorta pulsation normal.    Abdomen:Benign, Soft, non-tender, No masses, hepatosplenomegaly, No lymphadenopathy Endoc: No evident thyromegaly, no signs of acromegaly or Cushing features Skin:   warm, no rashes, no ecchymosis  Extremities: normal, no cyanosis, clubbing, no edema, warm with normal capillary refill.    Labs results:   Recent Labs    08/17/19 0354 08/18/19 0452 08/19/19 0556  HGB 12.4 12.2 12.5  HCT 36.0 35.0* 37.1  MCV 89.1 89.3 90.0  WBC 18.6* 16.0* 15.6*  BUN 39* 35* 33*  CREATININE 1.14* 0.96 0.89  GLUCOSE 180* 161* 140*  CALCIUM 9.2 8.8* 8.5*  ,      Rad results:   US RENAL ARTERY DUPLEX COMPLETE  Result Date: 08/19/2019 CLINICAL DATA:  85 year old female with a history of hypertension EXAM: RENAL/URINARY TRACT ULTRASOUND RENAL DUPLEX DOPPLER ULTRASOUND COMPARISON:  None. FINDINGS: Right Kidney: Length: 9.1 cm. Echogenicity within normal limits. No hydronephrosis. Focal hypoechoic region of the lateral cortex on the right kidney measures estimated 3.1 cm x 1.8 cm. Recent CT dated 04/22/2019 demonstrates no correlate, and this is a most likely artifactual Anechoic cyst on the lateral cortex, inferior pole right kidney measures less than 2 cm, compatible with Bosniak 1 cyst. Left Kidney: Length: 9.7 cm. Echogenicity similar to the contralateral kidney. No hydronephrosis. Anechoic cyst on the left kidney cortex compatible with Bosniak 1 cyst. Bladder:  Unremarkable RENAL DUPLEX ULTRASOUND Right Renal Artery Velocities: Origin:  152 cm/sec Mid:  60 cm/sec [place]:  79 cm/sec Interlobar:  37 cm/sec Arcuate:  33 cm/sec Left Renal Artery Velocities: Origin:  107 cm/sec Mid:  60 cm/sec Hilum:  71 cm/sec Interlobar:  49 cm/sec Arcuate:   36 cm/sec Aortic Velocity:  98 cm/sec Right Renal-Aortic Ratios: Origin: 1.6 Mid:  0.6 Hilum: 0.8 Interlobar: 0.4 Arcuate: 0.3 Left Renal-Aortic Ratios: Origin: 1.1 Mid: 0.6 Hilum: 0.7 Interlobar: 0.5 Arcuate: 0.4 IMPRESSION: Duplex of the renal artery demonstrates no evidence of high-grade stenosis. Bilateral Bosniak  1 cysts. Signed, Jaime S. Wagner, DO, RPVI [facility] Electronically Signed   By: Jaime  Wagner D.O.   On: 08/19/2019 12:13      Assessment and Plan: Stage III copd,dnr, Acute on chronic dyspnea, off bipap, less presence of anxiety on benzo.  Want to get up and ambulate.  -continue sameregimen -appreciate palliative input -On d/c evaluate for home o2 if indicated Following  Polycythemia vera, stable Hematology following  Hx of bladdercancer, s/p chemo/xrt, in remission Following oncology recs  Mild renal insufficiency ( cr1.12), creatine stable Cautious diuresis Avoid nephrotoxic meds   I have personally obtained a history, examined the patient, evaluated laboratory and imaging results, formulated the assessment and plan and placed orders.  The Patient requires high complexity decision making for assessment and support, frequent evaluation and titration of therapies, application of advanced monitoring technologies and extensive interpretation of multiple databases.    ,M.D. Pulmonary  Medicine [facility]    

## 2019-08-19 NOTE — Progress Notes (Signed)
Back from Ultrasound

## 2019-08-19 NOTE — Progress Notes (Signed)
0845 Down to Ultrasound unmonitored per Dr.Lai.

## 2019-08-19 NOTE — Progress Notes (Signed)
PROGRESS NOTE    Tina [name]  ACZ:[id number] DOB: May 27, 1934 DOA: 08/13/2019 PCP: Einar Pheasant, MD    Assessment & Plan:   Active Problems:   CAD (coronary artery disease)   COPD, severe (HCC)   COPD exacerbation (HCC)   Essential hypertension   CKD (chronic kidney disease), stage III (HCC)   CHF (congestive heart failure) (HCC)   Acute on chronic diastolic CHF (congestive heart failure) (HCC)   Dyspnea   Pleural effusion, left   Peripheral neuropathy   History of pulmonary embolism   Palliative care by specialist   DNR (do not resuscitate) discussion    Tina [name] a 85 y.o.Caucasian femalewith medical history significant forCOPD,hypertension,diastolic CHF,CAD, CKD 3b whopresented with persistent dyspnea.   # Dyspnea 2/2 COPD exacerbation # Chronic dyspnea # Severe COPD --Able to maintain O2 sat, however, has increased RR and visibly uncomfortable. Presented with WBC 20.6, though no fever, or consolidation seen on CXR.  Did have mod left pleural effusion, but didn't seem large enough to cause this amount of dyspnea.  Was recently hospitalized for COPD exacerbation only about a week ago, and finished 5 days of prednisone 40 mg daily, maybe needs a longer course or taper? --neg D-dimer, procal 0.15 --Based on overnight pulse ox patient does not qualify for nocturnal O2 --Pt's outpatient pulmonologist Dr. Raul Del saw pt on 4/5 PLAN: --Pulm consult on 4/1, recommend below: --continue steroid as IV solumedrol 20 mg daily --patient will likely require prolonged steroid taper on d/c from 40 to 5mg  over period of 14days --DuoNeb q4h scheduled --continue pulmicort --continue azithromycin 500 mg daily --Roflumilast 282mcg po  - IS and flutter at bedside --MetaNeb with saline for recruitment of LLL atelectasis -PT/OT --outpatient pulmonary rehab --Palliative consult  --IV ativan PRN for anxiety  Hx of Pulmonary embolism - finished 6 months of anticoagulation   --D-dimer neg during this admission.  Acute on Chronic diastolic CHF exacerbation --Last TTE on 08/03/19 showed LVEF 50-55% with grade I diastolic dysfunction. Pt was discharged with PRN torsemide due to AKI.  CXR on presentation didn't show pulm edema, however, did have mod pleural effusion. --BNP 1107 --s/p IV lasix 40 mg daily for 2 days, and already Cr bumped --IV Lasix 40 mg x1 f/b lasix gtt@4  on 4/4 PLAN: --continue lasix gtt, redose IV lasix 40 mg x1 today --Strict I/O  --Low sodium diet  Mod left pleural effusion --increased since 08/02/19.  Maybe contributing to dyspnea. --Thoracentesis 4/1 with 300 cc removed, transudate, but then it re-accumulated. --continue diuresis  CKD 3b --Cr 1.09 on presentation, which is baseline.  Uncontrolled HTN --was discharged on increase hydralazine to100mg  TID, metop to100mg  BID and home amlodipine (home losartan held due to AKI), and noted to have elevated BP at home. PLAN: --continue home amlodipine, hydralazine --continue labetalol (switched from home metop) --IV hydralazine PRN --renal artery duplex today, showed no stenosis  Historycoronary artery disease Angioplasty and stent placement in [facility]MA  Artery in January 2021  Continue statin continue aspirin, Plavix  Peripheral neuropathy and gait dysfunction --continue home gabapentin   DVT prophylaxis: Lovenox SQ Code Status: DNR  Family Communication: Sister updated at bedside today Disposition Plan: pt transferred back to floor today.  If dyspnea continues to improve, will discharge in 1-2 days, and do O2 qualifying test prior to discharge.     Subjective and Interval History:  Pt reported feeling better today, slept well, ate.  Didn't use BiPAP overnight.  Couldn't tell what worked to make her  feel better.  Urine output dropped off since yesterday.  No fever, chest pain, abdominal pain, N/V/D, dysuria.  Transfer out of stepdown.   Objective: Vitals:   08/19/19  1200 08/19/19 1400 08/19/19 1420 08/19/19 1547  BP: (!) 174/61 (!) 151/65  (!) 183/69  Pulse: (!) 102 68 93 79  Resp: 18 (!) 24 (!) 21   Temp:    98.5 F (36.9 C)  TempSrc:    Oral  SpO2: 90% 90% 95% 94%  Weight:      Height:        Intake/Output Summary (Last 24 hours) at 08/19/2019 1806 Last data filed at 08/19/2019 1000 Gross per 24 hour  Intake 120 ml  Output 1500 ml  Net -1380 ml   Filed Weights   08/13/19 1228 08/16/19 2313  Weight: 62 kg 62.2 kg    Examination:   Constitutional: NAD, AAOx3 HEENT: conjunctivae and lids normal, EOMI CV: RRR no M,R,G. Distal pulses +2.  No cyanosis.   RESP: No wheezing, clear, on 4L [place] GI: +BS, NTND Extremities: Edema in BLE resolved SKIN: warm, dry, bruising on both lower legs Neuro: II - XII grossly intact.  Sensation intact   Data Reviewed: I have personally reviewed following labs and imaging studies  CBC: Recent Labs  Lab 08/13/19 1234 08/14/19 0544 08/15/19 0544 08/16/19 0459 08/17/19 0354 08/18/19 0452 08/19/19 0556  WBC 20.6*   < > 15.7* 18.0* 18.6* 16.0* 15.6*  NEUTROABS 16.5*  --   --   --   --   --   --   HGB 13.7   < > 12.4 12.0 12.4 12.2 12.5  HCT 40.5   < > 36.2 35.6* 36.0 35.0* 37.1  MCV 91.0   < > 89.6 90.6 89.1 89.3 90.0  PLT 217   < > 188 183 177 161 153   < > = values in this interval not displayed.   Basic Metabolic Panel: Recent Labs  Lab 08/15/19 0544 08/16/19 0459 08/17/19 0354 08/18/19 0452 08/19/19 0556  NA 134* 134* 136 140 138  K 3.7 3.7 4.3 3.2* 3.2*  CL 96* 97* 98 96* 93*  CO2 27 28 27  33* 33*  GLUCOSE 166* 133* 180* 161* 140*  BUN 42* 38* 39* 35* 33*  CREATININE 1.39* 1.20* 1.14* 0.96 0.89  CALCIUM 8.9 9.1 9.2 8.8* 8.5*  MG 2.4 2.6* 2.5* 2.5* 2.7*  PHOS  --   --   --   --  2.7   GFR: Estimated Creatinine Clearance: 43.9 mL/min (by C-G formula based on SCr of 0.89 mg/dL). Liver Function Tests: No results for input(s): AST, ALT, ALKPHOS, BILITOT, PROT, ALBUMIN in the last 168  hours. No results for input(s): LIPASE, AMYLASE in the last 168 hours. No results for input(s): AMMONIA in the last 168 hours. Coagulation Profile: No results for input(s): INR, PROTIME in the last 168 hours. Cardiac Enzymes: No results for input(s): CKTOTAL, CKMB, CKMBINDEX, TROPONINI in the last 168 hours. BNP (last 3 results) No results for input(s): PROBNP in the last 8760 hours. HbA1C: No results for input(s): HGBA1C in the last 72 hours. CBG: Recent Labs  Lab 08/16/19 2310  GLUCAP 205*   Lipid Profile: No results for input(s): CHOL, HDL, LDLCALC, TRIG, CHOLHDL, LDLDIRECT in the last 72 hours. Thyroid Function Tests: No results for input(s): TSH, T4TOTAL, FREET4, T3FREE, THYROIDAB in the last 72 hours. Anemia Panel: No results for input(s): VITAMINB12, FOLATE, FERRITIN, TIBC, IRON, RETICCTPCT in the last 72 hours. Sepsis Labs:  Recent Labs  Lab 08/13/19 1949  PROCALCITON 0.15    Recent Results (from the past 240 hour(s))  SARS CORONAVIRUS 2 (TAT 6-24 HRS) Nasopharyngeal Nasopharyngeal Swab     Status: None   Collection Time: 08/13/19  2:46 PM   Specimen: Nasopharyngeal Swab  Result Value Ref Range Status   SARS Coronavirus 2 NEGATIVE NEGATIVE Final    Comment: (NOTE) SARS-CoV-2 target nucleic acids are NOT DETECTED. The SARS-CoV-2 RNA is generally detectable in upper and lower respiratory specimens during the acute phase of infection. Negative results do not preclude SARS-CoV-2 infection, do not rule out co-infections with other pathogens, and should not be used as the sole basis for treatment or other patient management decisions. Negative results must be combined with clinical observations, patient history, and epidemiological information. The expected result is Negative. Fact Sheet for Patients: SugarRoll.be Fact Sheet for Healthcare Providers: https://www.woods-mathews.com/ This test is not yet approved or cleared by  the [place] FDA and  has been authorized for detection and/or diagnosis of SARS-CoV-2 by FDA under an Emergency Use Authorization (EUA). This EUA will remain  in effect (meaning this test can be used) for the duration of the COVID-19 declaration under Section 56 4(b)(1) of the Act, 21 U.S.C. section 360bbb-3(b)(1), unless the authorization is terminated or revoked sooner. Performed at [facility], [place] [address] [place], [place] [address]   MRSA PCR Screening     Status: None   Collection Time: 08/16/19 11:05 PM   Specimen: Nasopharyngeal  Result Value Ref Range Status   MRSA by PCR NEGATIVE NEGATIVE Final    Comment:        The GeneXpert MRSA Assay (FDA approved for NASAL specimens only), is one component of a comprehensive MRSA colonization surveillance program. It is not intended to diagnose MRSA infection nor to guide or monitor treatment for MRSA infections. Performed at [facility], [address], [place], [place] [address]       Radiology Studies: US RENAL ARTERY DUPLEX COMPLETE  Result Date: 08/19/2019 CLINICAL DATA:  85 year old female with a history of hypertension EXAM: RENAL/URINARY TRACT ULTRASOUND RENAL DUPLEX DOPPLER ULTRASOUND COMPARISON:  None. FINDINGS: Right Kidney: Length: 9.1 cm. Echogenicity within normal limits. No hydronephrosis. Focal hypoechoic region of the lateral cortex on the right kidney measures estimated 3.1 cm x 1.8 cm. Recent CT dated 04/22/2019 demonstrates no correlate, and this is a most likely artifactual Anechoic cyst on the lateral cortex, inferior pole right kidney measures less than 2 cm, compatible with Bosniak 1 cyst. Left Kidney: Length: 9.7 cm. Echogenicity similar to the contralateral kidney. No hydronephrosis. Anechoic cyst on the left kidney cortex compatible with Bosniak 1 cyst. 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 Bilateral Bosniak 1 cysts. Signed, Dulcy Fanny. Dellia Nims, RPVI [facility] Radiology Electronically Signed   By: Corrie Mckusick D.O.   On: 08/19/2019 12:13     Scheduled Meds: . amLODipine  10  mg Oral Daily  . aspirin EC  81 mg Oral Daily  . azithromycin  500 mg Oral Daily  . budesonide (PULMICORT) nebulizer solution  0.5 mg Nebulization BID  . Chlorhexidine Gluconate Cloth  6 each Topical Daily  . clopidogrel  75 mg Oral Daily  . diazepam  1 mg Oral Once  . enoxaparin (LOVENOX) injection  40 mg Subcutaneous Q24H  . furosemide  40 mg Intravenous Once  . gabapentin  200 mg Oral QHS  . hydrALAZINE  100 mg Oral TID  . ipratropium-albuterol  3 mL Nebulization TID  . labetalol  200 mg Oral BID  . methylPREDNISolone (SOLU-MEDROL) injection  20 mg Intravenous Daily  . pravastatin  40 mg Oral q1800  . roflumilast  250 mcg Oral Daily   Continuous Infusions: . furosemide (LASIX) infusion 4 mg/hr (08/18/19 1800)     LOS: 6 days     Enzo Bi, MD [facility] If 7PM-7AM, please contact night-coverage 08/19/2019, 6:06 PM

## 2019-08-20 LAB — CBC
HCT: 37.9 % (ref 36.0–46.0)
Hemoglobin: 12.8 g/dL (ref 12.0–15.0)
MCH: 30.5 pg (ref 26.0–34.0)
MCHC: 33.8 g/dL (ref 30.0–36.0)
MCV: 90.5 fL (ref 80.0–100.0)
Platelets: 152 10*3/uL (ref 150–400)
RBC: 4.19 MIL/uL (ref 3.87–5.11)
RDW: 13.4 % (ref 11.5–15.5)
WBC: 13.3 10*3/uL — ABNORMAL HIGH (ref 4.0–10.5)
nRBC: 0 % (ref 0.0–0.2)

## 2019-08-20 LAB — BASIC METABOLIC PANEL
Anion gap: 11 (ref 5–15)
BUN: 27 mg/dL — ABNORMAL HIGH (ref 8–23)
CO2: 33 mmol/L — ABNORMAL HIGH (ref 22–32)
Calcium: 8.6 mg/dL — ABNORMAL LOW (ref 8.9–10.3)
Chloride: 93 mmol/L — ABNORMAL LOW (ref 98–111)
Creatinine, Ser: 0.82 mg/dL (ref 0.44–1.00)
GFR calc Af Amer: >60 mL/min (ref >60)
GFR calc non Af Amer: >60 mL/min (ref >60)
Glucose, Bld: 112 mg/dL — ABNORMAL HIGH (ref 70–99)
Potassium: 3.2 mmol/L — ABNORMAL LOW (ref 3.5–5.1)
Sodium: 137 mmol/L (ref 135–145)

## 2019-08-20 LAB — BLOOD GAS, ARTERIAL
Acid-Base Excess: 3.6 mmol/L — ABNORMAL HIGH (ref 0.0–2.0)
Bicarbonate: 26.3 mmol/L (ref 20.0–28.0)
FIO2: 0.32
O2 Saturation: 97.8 %
Patient temperature: 37
pCO2 arterial: 33 mmHg (ref 32.0–48.0)
pH, Arterial: 7.51 — ABNORMAL HIGH (ref 7.350–7.450)
pO2, Arterial: 91 mmHg (ref 83.0–108.0)

## 2019-08-20 LAB — MAGNESIUM: Magnesium: 2.4 mg/dL (ref 1.7–2.4)

## 2019-08-20 MED ORDER — LOSARTAN POTASSIUM 50 MG PO TABS
100.0000 mg | ORAL_TABLET | Freq: Every day | ORAL | Status: DC
  Administered 2019-08-20 – 2019-08-22 (×3): 100 mg via ORAL
  Filled 2019-08-20 (×2): qty 2

## 2019-08-20 MED ORDER — POTASSIUM CHLORIDE CRYS ER 20 MEQ PO TBCR
40.0000 meq | EXTENDED_RELEASE_TABLET | Freq: Once | ORAL | Status: AC
  Administered 2019-08-20: 09:00:00 40 meq via ORAL
  Filled 2019-08-20: qty 2

## 2019-08-20 MED ORDER — PREDNISONE 20 MG PO TABS
40.0000 mg | ORAL_TABLET | Freq: Every day | ORAL | Status: DC
  Administered 2019-08-20 – 2019-08-22 (×3): 40 mg via ORAL
  Filled 2019-08-20 (×3): qty 2

## 2019-08-20 NOTE — Progress Notes (Signed)
Date: 08/20/2019,   MRN# [id number] DIAHN WAIDELICH Dec 27, 1934 Code Status:     Code Status Orders  (From admission, onward)         Start     Ordered   08/13/19 1827  Do not attempt resuscitation (DNR)  Continuous    Question Answer Comment  In the event of cardiac or respiratory ARREST Do not call a "code blue"   In the event of cardiac or respiratory ARREST Do not perform Intubation, CPR, defibrillation or ACLS   In the event of cardiac or respiratory ARREST Use medication by any route, position, wound care, and other measures to relive pain and suffering. May use oxygen, suction and manual treatment of airway obstruction as needed for comfort.   Comments Code status was discussed with patient and she wishes to be placed on a DNR status      08/13/19 1826        Code Status History    Date Active Date Inactive Code Status Order ID Comments User Context   08/02/2019 1627 08/06/2019 2158 DNR [id number]  Collier Bullock, MD ED   08/02/2019 1547 08/02/2019 1627 Full Code [id number]  Collier Bullock, MD ED   05/10/2019 1747 05/14/2019 0108 DNR [id number]  Lorella Nimrod, MD ED   10/21/2017 1101 10/22/2017 1529 DNR [id number]  Bettey Costa, MD Inpatient   10/20/2017 1405 10/21/2017 1101 Full Code [id number]  Idelle Crouch, MD Inpatient   02/11/2017 2018 02/15/2017 1534 DNR [id number]  Fritzi Mandes, MD Inpatient   08/21/2016 2204 08/23/2016 1511 Full Code [id number]  Henreitta Leber, MD Inpatient   06/14/2016 2028 06/17/2016 1621 Full Code [id number]  Theodoro Grist, MD Inpatient   11/14/2014 0857 11/15/2014 1557 Full Code [id number]  Aldean Jewett, MD Inpatient   Advance Care Planning Activity    Advance Directive Documentation     Most Recent Value  Type of Advance Directive  Healthcare Power of Attorney  Pre-existing out of facility DNR order (yellow form or pink MOST form)  --  "MOST" Form in Place?  --      HPI: today see is c/o sob with minimum activity. No chest pain, leg pain, rare wheezing, Had a  long talk with her: she will always be short of breath, complicated by anxiety, and the sensation odf dyspnea. Seem less anxious today. She need to work on her strength. She is in no way able to home and walkup her stairs to the bathrooms which are all up stairs. Discussed rehab with and the team. Pt to evaluate.      PMHX:   Past Medical History:  Diagnosis Date  . Anemia   . Asthma   . Bladder cancer ([place])   . BRCA positive   . CAD (coronary artery disease)   . CHF (congestive heart failure) ([place])   . COPD (chronic obstructive pulmonary disease) ([place])   . Emphysema lung ([place])   . GERD (gastroesophageal reflux disease)   . History of colon polyps   . Hypercholesterolemia   . Hyperglycemia   . Hypertension   . Lung nodules   . Osteoporosis   . Personal history of tobacco use, presenting hazards to health 11/25/2014  . Polycythemia vera(238.4)   . Renal cyst   . Skin cancer    Surgical Hx:  Past Surgical History:  Procedure Laterality Date  . ANGIOPLASTY     coronary (x1)  . COLONOSCOPY WITH PROPOFOL N/A 03/02/2015   Procedure: COLONOSCOPY WITH PROPOFOL;  Surgeon: Billie Ruddy  Gustavo Lah, MD;  Location: [facility] ENDOSCOPY;  Service: Endoscopy;  Laterality: N/A;  . COLONOSCOPY WITH PROPOFOL N/A 02/13/2017   Procedure: COLONOSCOPY WITH PROPOFOL;  Surgeon: [name], MD;  [facility]COPY;  Service: Endoscopy;  Laterality: N/A;  . CORONARY ANGIOPLASTY    . CORONARY ARTERY BYPASS GRAFT  09/17/2017   pt denies  . CYSTOSCOPY W/ RETROGRADES Bilateral 02/13/2017   Procedure: CYSTOSCOPY WITH RETROGRADE PYELOGRAM;  Surgeon: Hollice Espy, MD;  Location: [facility] ORS;  Service: Urology;  Laterality: Bilateral;  . CYSTOSCOPY W/ RETROGRADES Bilateral 09/19/2017   Procedure: CYSTOSCOPY WITH RETROGRADE PYELOGRAM;  Surgeon: Hollice Espy, MD;  Location: [facility] ORS;  Service: Urology;  Laterality: Bilateral;  . CYSTOSCOPY WITH BIOPSY  03/26/2017   Procedure: CYSTOSCOPY WITH BIOPSY;  Surgeon:  Hollice Espy, MD;  Location: [facility] ORS;  Service: Urology;;  . Consuela Mimes WITH STENT PLACEMENT Bilateral 02/13/2017   Procedure: CYSTOSCOPY WITH STENT PLACEMENT;  Surgeon: Hollice Espy, MD;  Location: [facility] ORS;  Service: Urology;  Laterality: Bilateral;  . CYSTOSCOPY WITH STENT PLACEMENT Bilateral 03/26/2017   Procedure: CYSTOSCOPY WITH STENT EXCHANGE;  Surgeon: Hollice Espy, MD;  Location: [facility] ORS;  Service: Urology;  Laterality: Bilateral;  . ESOPHAGOGASTRODUODENOSCOPY (EGD) WITH PROPOFOL N/A 02/13/2017   Procedure: ESOPHAGOGASTRODUODENOSCOPY (EGD) WITH PROPOFOL;  Surgeon: [name], MD;  Location: [facility] ENDOSCOPY;  Service: Endoscopy;  Laterality: N/A;  . PORTA CATH INSERTION N/A 02/28/2017   Procedure: PORTA CATH INSERTION;  Surgeon: Algernon Huxley, MD;  Location: [place] CV LAB;  Service: Cardiovascular;  Laterality: N/A;  . PORTA CATH REMOVAL N/A 12/24/2017   Procedure: PORTA CATH REMOVAL;  Surgeon: Algernon Huxley, MD;  Location: [place] CV LAB;  Service: Cardiovascular;  Laterality: N/A;  . SALPINGOOPHORECTOMY    . TONSILECTOMY/ADENOIDECTOMY WITH MYRINGOTOMY    . TONSILLECTOMY    . TRANSURETHRAL RESECTION OF BLADDER TUMOR N/A 02/13/2017   Procedure: TRANSURETHRAL RESECTION OF BLADDER TUMOR (TURBT);  Surgeon: Hollice Espy, MD;  Location: [facility] ORS;  Service: Urology;  Laterality: N/A;  . TUBAL LIGATION    . UPPER GI ENDOSCOPY  02/13/2017  . URETEROSCOPY Left 09/19/2017   Procedure: URETEROSCOPY;  Surgeon: Hollice Espy, MD;  Location: [facility] ORS;  Service: Urology;  Laterality: Left;  Marland Kitchen VISCERAL ANGIOGRAPHY N/A 01/23/2019   Procedure: VISCERAL ANGIOGRAPHY;  Surgeon: Algernon Huxley, MD;  Location: [place] CV LAB;  Service: Cardiovascular;  Laterality: N/A;  . VISCERAL ANGIOGRAPHY N/A 06/09/2019   Procedure: VISCERAL ANGIOGRAPHY;  Surgeon: Algernon Huxley, MD;  Location: [place] CV LAB;  Service: Cardiovascular;  Laterality: N/A;  . VISCERAL ARTERY INTERVENTION N/A  02/12/2017   Procedure: VISCERAL ARTERY INTERVENTION;  Surgeon: Algernon Huxley, MD;  Location: [place] CV LAB;  Service: Cardiovascular;  Laterality: N/A;   Family Hx:  Family History  Problem Relation Age of Onset  . Cirrhosis Father        died age 55  . Alcohol abuse Father   . Asthma Mother   . Congestive Heart Failure Mother   . Breast cancer Mother        2 (1/2 sisters)  . Osteoarthritis Mother   . Colon cancer Mother   . Lupus Sister   . Alcohol abuse Sister   . Ovarian cancer Sister   . Osteoporosis Sister   . Skin cancer Sister   . Breast cancer Cousin   . Breast cancer Maternal Aunt    Social Hx:   Social History   Tobacco Use  . Smoking status: Former  Smoker    Packs/day: 1.00    Years: 45.00    Pack years: 45.00    Types: Cigarettes    Quit date: 11/11/2003    Years since quitting: 15.7  . Smokeless tobacco: Never Used  Substance Use Topics  . Alcohol use: Not Currently    Alcohol/week: 0.0 standard drinks  . Drug use: No   Medication:    Home Medication:    Current Medication: _0 @   Allergies:  Evista [raloxifene] and Fluticasone-salmeterol  Review of Systems: Gen:  Denies  fever, sweats, chills HEENT: Denies blurred vision, double vision, ear pain, eye pain, hearing loss, nose bleeds, sore throat Cvc:  No dizziness, chest pain or heaviness Resp:   Sob with minimum activity Gi: Denies swallowing difficulty, stomach pain, nausea or vomiting, diarrhea, constipation, bowel incontinence Gu:  Denies bladder incontinence, burning urine Ext:   No Joint pain, stiffness or swelling Skin: No skin rash, easy bruising or bleeding or hives Endoc:  No polyuria, polydipsia , polyphagia or weight change Psych: No depression, insomnia or hallucinations  Other:  All other systems negative  Physical Examination:   VS: BP (!) 186/90 (BP Location: Left Arm)   Pulse (!) 112   Temp 98.4 F (36.9 C) (Oral)   Resp 19   Ht _1  (1.651 m)   Wt 62.2  kg   SpO2 93%   BMI 22.82 kg/m   General Appearance: No distress , [place] o2 on at 3 liters, family in th room Neuro: without focal findings, mental status, speech normal, alert and oriented, cranial nerves 2-12 intact, reflexes normal and symmetric, sensation grossly normal  HEENT: PERRLA, EOM intact, no ptosis, no other lesions noticed Pulmonary:.No wheezing, No rales  No use of accessory muscle.   Cardiovascular:  Normal S1,S2.  No m/r/g.  Abdominal aorta pulsation normal.    Abdomen:Benign, Soft, non-tender, No masses, hepatosplenomegaly, No lymphadenopathy Endoc: No evident thyromegaly, no signs of acromegaly or Cushing features Skin:   warm, no rashes, no ecchymosis  Extremities: normal, no cyanosis, clubbing, no edema, warm with normal capillary refill. Other findings:   Labs results:   Recent Labs    08/18/19 0452 08/19/19 0556 08/20/19 0413  HGB 12.2 12.5 12.8  HCT 35.0* 37.1 37.9  MCV 89.3 90.0 90.5  WBC 16.0* 15.6* 13.3*  BUN 35* 33* 27*  CREATININE 0.96 0.89 0.82  GLUCOSE 161* 140* 112*  CALCIUM 8.8* 8.5* 8.6*  ,       Assessment and Plan: Stage IIIcopd,dnr, Acute on chronic dyspnea, off bipap, less presence of anxiety on benzo.pt to evaluate for rehab -continue sameregimen -appreciate palliative input -home o2 if indicated Following  Polycythemia vera, stable Hematology following  Hx of bladdercancer, s/p chemo/xrt, in remission Following oncology recs    I have personally obtained a history, examined the patient, evaluated laboratory and imaging results, formulated the assessment and plan and placed orders.  The Patient requires high complexity decision making for assessment and support, frequent evaluation and titration of therapies, application of advanced monitoring technologies and extensive interpretation of multiple databases.   Deaisha Welborn,M.D. Pulmonary  Medicine [facility]

## 2019-08-20 NOTE — Progress Notes (Signed)
No scale designated on HS Novolog coverage. Pharmacy notified. Per pharmacist, correcting order so that scale will show in mar.

## 2019-08-20 NOTE — Care Management Important Message (Signed)
Important Message  Patient Details  Name: Tina [name] MRN: [id number] Date of Birth: 21-Jun-1934   Medicare Important Message Given:  Yes     Juliann Pulse A Jaleiah Asay 08/20/2019, 10:22 AM

## 2019-08-20 NOTE — Progress Notes (Addendum)
PROGRESS NOTE    Tina [name]  IDP:[id number] DOB: 1935/01/23 DOA: 08/13/2019 PCP: Einar Pheasant, MD    Assessment & Plan:   Active Problems:   CAD (coronary artery disease)   COPD, severe (HCC)   COPD exacerbation (HCC)   Essential hypertension   CKD (chronic kidney disease), stage III (HCC)   CHF (congestive heart failure) (HCC)   Acute on chronic diastolic CHF (congestive heart failure) (HCC)   Dyspnea   Pleural effusion, left   Peripheral neuropathy   History of pulmonary embolism   Palliative care by specialist   DNR (do not resuscitate) discussion    Tina [name] a 85 y.o.Caucasian femalewith medical history significant forsevere COPD,hypertension,diastolic CHF,CAD, CKD 3b whopresented with persistent dyspnea.   # Dyspnea 2/2 COPD exacerbation # Chronic dyspnea # Severe COPD --On presentation was able to maintain O2 sat, however, had increased RR and visibly uncomfortable. Presented with WBC 20.6, though no fever, or consolidation seen on CXR (recent prednisone use).  Did have mod left pleural effusion, but didn't seem large enough to cause this amount of dyspnea.  Was recently hospitalized for COPD exacerbation only about a week prior, and finished 5 days of prednisone 40 mg daily, maybe needs a longer course or taper? --neg D-dimer, procal 0.15 --Based on overnight pulse ox patient does not qualify for nocturnal O2 --Pulm consult on 4/1.  Pt's outpatient pulmonologist Dr. Raul Del saw pt on 4/5 --finished a course of azithromycin 500 mg PLAN: --Taper steroid down to prednisone 40 mg daily today --patient will likely require prolonged steroid taper on d/c from 40 to 5 mg over period of 14days --DuoNeb q4h scheduled --continue pulmicort --Roflumilast 264mcg po  - IS and flutter at bedside --MetaNeb with saline for recruitment of LLL atelectasis --outpatient pulmonary rehab and f/u with Dr. Raul Del. --IV ativan PRN for anxiety --Palliative consult, per  pulm, pt is end-stage COPD. --Attempt to qualify for home O2 today with ambulation test. --PT/OT to assess for rehab needs.  Hx of Pulmonary embolism - finished 6 months of anticoagulation  --D-dimer neg during this admission.  Acute on Chronic diastolic CHF exacerbation --Last TTE on 08/03/19 showed LVEF 50-55% with grade I diastolic dysfunction. Pt was discharged with torsemide as PRN due to AKI.  CXR on presentation didn't show pulm edema, however, did have mod pleural effusion. --BNP 1107 --s/p IV lasix 40 mg daily for 2 days on presentation, and already Cr bumped, so aggressive diuresis was held.  However, subsequent CXR showed signs of more fluid overload, so diuresis attempt again, and this time, pt did well on IV Lasix 40 mg x1 f/b Lasix gtt@4  started on 4/4, with improvement in Cr even.  BLE swelling has resolved, however, pt still reported no improvement in her dyspnea. PLAN: --continue lasix gtt, redose IV lasix 40 mg x1 today --transition to home oral torsemide prior to discharge. --Strict I/O  --Low sodium diet  Mod left pleural effusion --increased since 08/02/19.  Maybe contributing to dyspnea. --Thoracentesis 4/1 with 300 cc removed, transudate, but then it re-accumulated. --continue diuresis as above  CKD 3b --Cr 1.09 on presentation, which is baseline.  Cr actually improved down to 0.82 with diuresis.  Uncontrolled HTN --was recently discharged on increase hydralazine to100mg  TID, metop to100mg  BID and home amlodipine (home losartan held due to AKI), and noted to have elevated BP at home. --renal artery duplex 08/19/19, showed no stenosis PLAN: --continue home amlodipine, hydralazine --continue labetalol (switched from home metop) --Add losartan 100  mg daily today, since Cr improved --IV hydralazine PRN   Historycoronary artery disease Angioplasty and stent placement in [facility]  Artery in January 2021  Continue statin continue aspirin, Plavix  Peripheral  neuropathy and gait dysfunction --continue home gabapentin   DVT prophylaxis: Lovenox SQ Code Status: DNR  Family Communication: Sister and daughter updated at bedside today Disposition Plan: Pt's own pulm Dr. Raul Del does not think pt's dyspnea will improve any more, and pt has already received all available treatment.  We have both told pt today that she needs to learn to live with her chronic dyspnea.  Therefore, pt is medically ready to be discharged tomorrow with prednisone taper and suppl O2 (hopefully qualify).  PT/OT to determine home vs SNF rehab.  Was not able to get PT earlier due to severe dyspnea with any exertion, and had been in stepdown until today.     Subjective and Interval History:  Pt transferred out of stepdown yesterday.  Today, pt reported not feeling better.  Complained of not being able to get out of bed.  No fever, chest pain, abdominal pain, N/V/D, dysuria.  PT and walk O2 test today.   Objective: Vitals:   08/20/19 0737 08/20/19 0748 08/20/19 1339 08/20/19 1600  BP:  (!) 183/59  (!) 186/90  Pulse:  70  (!) 112  Resp:  (!) 21  19  Temp:  98.6 F (37 C)  98.4 F (36.9 C)  TempSrc:  Oral  Oral  SpO2: 97% 96% 97% 93%  Weight:      Height:        Intake/Output Summary (Last 24 hours) at 08/20/2019 1740 Last data filed at 08/20/2019 1615 Gross per 24 hour  Intake 1043.36 ml  Output 2300 ml  Net -1256.64 ml   Filed Weights   08/13/19 1228 08/16/19 2313  Weight: 62 kg 62.2 kg    Examination:   Constitutional: NAD, AAOx3 HEENT: conjunctivae and lids normal, EOMI CV: RRR no M,R,G. Distal pulses +2.  No cyanosis.   RESP: Diffuse soft wheezes, normal work-of-breathing on 3L [place] GI: +BS, NTND Extremities: Edema in BLE resolved SKIN: warm, dry, bruising on both lower legs Neuro: II - XII grossly intact.  Sensation intact   Data Reviewed: I have personally reviewed following labs and imaging studies  CBC: Recent Labs  Lab 08/16/19 0459 08/17/19  0354 08/18/19 0452 08/19/19 0556 08/20/19 0413  WBC 18.0* 18.6* 16.0* 15.6* 13.3*  HGB 12.0 12.4 12.2 12.5 12.8  HCT 35.6* 36.0 35.0* 37.1 37.9  MCV 90.6 89.1 89.3 90.0 90.5  PLT 183 177 161 153 440   Basic Metabolic Panel: Recent Labs  Lab 08/16/19 0459 08/17/19 0354 08/18/19 0452 08/19/19 0556 08/20/19 0413  NA 134* 136 140 138 137  K 3.7 4.3 3.2* 3.2* 3.2*  CL 97* 98 96* 93* 93*  CO2 28 27 33* 33* 33*  GLUCOSE 133* 180* 161* 140* 112*  BUN 38* 39* 35* 33* 27*  CREATININE 1.20* 1.14* 0.96 0.89 0.82  CALCIUM 9.1 9.2 8.8* 8.5* 8.6*  MG 2.6* 2.5* 2.5* 2.7* 2.4  PHOS  --   --   --  2.7  --    GFR: Estimated Creatinine Clearance: 47.6 mL/min (by C-G formula based on SCr of 0.82 mg/dL). Liver Function Tests: No results for input(s): AST, ALT, ALKPHOS, BILITOT, PROT, ALBUMIN in the last 168 hours. No results for input(s): LIPASE, AMYLASE in the last 168 hours. No results for input(s): AMMONIA in the last 168 hours. Coagulation  Profile: No results for input(s): INR, PROTIME in the last 168 hours. Cardiac Enzymes: No results for input(s): CKTOTAL, CKMB, CKMBINDEX, TROPONINI in the last 168 hours. BNP (last 3 results) No results for input(s): PROBNP in the last 8760 hours. HbA1C: No results for input(s): HGBA1C in the last 72 hours. CBG: Recent Labs  Lab 08/16/19 2310  GLUCAP 205*   Lipid Profile: No results for input(s): CHOL, HDL, LDLCALC, TRIG, CHOLHDL, LDLDIRECT in the last 72 hours. Thyroid Function Tests: No results for input(s): TSH, T4TOTAL, FREET4, T3FREE, THYROIDAB in the last 72 hours. Anemia Panel: No results for input(s): VITAMINB12, FOLATE, FERRITIN, TIBC, IRON, RETICCTPCT in the last 72 hours. Sepsis Labs: Recent Labs  Lab 08/13/19 1949  PROCALCITON 0.15    Recent Results (from the past 240 hour(s))  SARS CORONAVIRUS 2 (TAT 6-24 HRS) Nasopharyngeal Nasopharyngeal Swab     Status: None   Collection Time: 08/13/19  2:46 PM   Specimen:  Nasopharyngeal Swab  Result Value Ref Range Status   SARS Coronavirus 2 NEGATIVE NEGATIVE Final    Comment: (NOTE) SARS-CoV-2 target nucleic acids are NOT DETECTED. The SARS-CoV-2 RNA is generally detectable in upper and lower respiratory specimens during the acute phase of infection. Negative results do not preclude SARS-CoV-2 infection, do not rule out co-infections with other pathogens, and should not be used as the sole basis for treatment or other patient management decisions. Negative results must be combined with clinical observations, patient history, and epidemiological information. The expected result is Negative. Fact Sheet for Patients: SugarRoll.be Fact Sheet for Healthcare Providers: https://www.woods-mathews.com/ This test is not yet approved or cleared by the [place] FDA and  has been authorized for detection and/or diagnosis of SARS-CoV-2 by FDA under an Emergency Use Authorization (EUA). This EUA will remain  in effect (meaning this test can be used) for the duration of the COVID-19 declaration under Section 56 4(b)(1) of the Act, 21 U.S.C. section 360bbb-3(b)(1), unless the authorization is terminated or revoked sooner. Performed at [facility], [place] [address] [place], [place] [address]   MRSA PCR Screening     Status: None   Collection Time: 08/16/19 11:05 PM   Specimen: Nasopharyngeal  Result Value Ref Range Status   MRSA by PCR NEGATIVE NEGATIVE Final    Comment:        The GeneXpert MRSA Assay (FDA approved for NASAL specimens only), is one component of a comprehensive MRSA colonization surveillance program. It is not intended to diagnose MRSA infection nor to guide or monitor treatment for MRSA infections. Performed at [facility], [address] [place], [place] [address]       Radiology Studies: US RENAL ARTERY DUPLEX COMPLETE  Result Date: 08/19/2019 CLINICAL DATA:   85 year old female with a history of hypertension EXAM: RENAL/URINARY TRACT ULTRASOUND RENAL DUPLEX DOPPLER ULTRASOUND COMPARISON:  None. FINDINGS: Right Kidney: Length: 9.1 cm. Echogenicity within normal limits. No hydronephrosis. Focal hypoechoic region of the lateral cortex on the right kidney measures estimated 3.1 cm x 1.8 cm. Recent CT dated 04/22/2019 demonstrates no correlate, and this is a most likely artifactual Anechoic cyst on the lateral cortex, inferior pole right kidney measures less than 2 cm, compatible with Bosniak 1 cyst. Left Kidney: Length: 9.7 cm. Echogenicity similar to the contralateral kidney. No hydronephrosis. Anechoic cyst on the left kidney cortex compatible with Bosniak 1 cyst. 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 Bilateral Bosniak 1 cysts. Signed, Dulcy Fanny. Dellia Nims, RPVI [facility] Radiology Electronically Signed   By: Corrie Mckusick D.O.   On: 08/19/2019 12:13     Scheduled Meds: . amLODipine  10 mg Oral Daily  . aspirin EC  81 mg Oral Daily  . budesonide (PULMICORT) nebulizer solution  0.5 mg Nebulization BID  . Chlorhexidine Gluconate Cloth  6 each Topical Daily  . clopidogrel  75 mg Oral Daily  . diazepam  1 mg Oral Once  . enoxaparin (LOVENOX) injection  40 mg Subcutaneous Q24H  . furosemide  40 mg Intravenous Once  . gabapentin  200 mg Oral QHS  . hydrALAZINE  100 mg  Oral TID  . ipratropium-albuterol  3 mL Nebulization TID  . labetalol  200 mg Oral BID  . losartan  100 mg Oral Daily  . pravastatin  40 mg Oral q1800  . predniSONE  40 mg Oral Q breakfast  . roflumilast  250 mcg Oral Daily   Continuous Infusions: . furosemide (LASIX) infusion 4 mg/hr (08/19/19 2038)     LOS: 7 days     Enzo Bi, MD [facility] If 7PM-7AM, please contact night-coverage 08/20/2019, 5:40 PM

## 2019-08-21 DIAGNOSIS — N183 Chronic kidney disease, stage 3 unspecified: Secondary | ICD-10-CM

## 2019-08-21 LAB — SARS CORONAVIRUS 2 (TAT 6-24 HRS): SARS Coronavirus 2: NEGATIVE

## 2019-08-21 LAB — CBC
HCT: 36.7 % (ref 36.0–46.0)
Hemoglobin: 12.3 g/dL (ref 12.0–15.0)
MCH: 30.1 pg (ref 26.0–34.0)
MCHC: 33.5 g/dL (ref 30.0–36.0)
MCV: 90 fL (ref 80.0–100.0)
Platelets: 153 10*3/uL (ref 150–400)
RBC: 4.08 MIL/uL (ref 3.87–5.11)
RDW: 13.3 % (ref 11.5–15.5)
WBC: 12.4 10*3/uL — ABNORMAL HIGH (ref 4.0–10.5)
nRBC: 0 % (ref 0.0–0.2)

## 2019-08-21 LAB — BASIC METABOLIC PANEL
Anion gap: 10 (ref 5–15)
BUN: 33 mg/dL — ABNORMAL HIGH (ref 8–23)
CO2: 36 mmol/L — ABNORMAL HIGH (ref 22–32)
Calcium: 8.8 mg/dL — ABNORMAL LOW (ref 8.9–10.3)
Chloride: 91 mmol/L — ABNORMAL LOW (ref 98–111)
Creatinine, Ser: 0.89 mg/dL (ref 0.44–1.00)
GFR calc Af Amer: >60 mL/min (ref >60)
GFR calc non Af Amer: >60 mL/min (ref >60)
Glucose, Bld: 124 mg/dL — ABNORMAL HIGH (ref 70–99)
Potassium: 3.2 mmol/L — ABNORMAL LOW (ref 3.5–5.1)
Sodium: 137 mmol/L (ref 135–145)

## 2019-08-21 LAB — MAGNESIUM: Magnesium: 2.5 mg/dL — ABNORMAL HIGH (ref 1.7–2.4)

## 2019-08-21 MED ORDER — HYDRALAZINE HCL 50 MG PO TABS
50.0000 mg | ORAL_TABLET | Freq: Three times a day (TID) | ORAL | Status: DC
  Administered 2019-08-21: 22:00:00 50 mg via ORAL
  Filled 2019-08-21 (×2): qty 1

## 2019-08-21 MED ORDER — POTASSIUM CHLORIDE CRYS ER 20 MEQ PO TBCR
20.0000 meq | EXTENDED_RELEASE_TABLET | Freq: Once | ORAL | Status: AC
  Administered 2019-08-21: 08:00:00 20 meq via ORAL
  Filled 2019-08-21: qty 1

## 2019-08-21 MED ORDER — LOPERAMIDE HCL 2 MG PO CAPS
2.0000 mg | ORAL_CAPSULE | ORAL | Status: DC | PRN
  Filled 2019-08-21: qty 1

## 2019-08-21 NOTE — Progress Notes (Signed)
Patient had hydralazine due and BP 122/33 map 60, messaged Dr. Loleta Books to see if he wanted me to hold the medication, Dr. Loleta Books called me and said he would put in parameters and it was okay for me to hold it.

## 2019-08-21 NOTE — TOC Progression Note (Signed)
Transition of Care [facility]) - Progression Note    Patient Details  Name: Tina [name] MRN: [id number] Date of Birth: 1935/02/24  Transition of Care [facility]) CM/SW Contact  Shelbie Ammons, RN Phone Number: 08/21/2019, 10:42 AM  Clinical Narrative:   Per Aniceto Boss with Palliative she is going to touch base with patient again today. Seen by pulmonology and they feel that her shortness of breath is not going to change with any more treatment. She will be re-checked today for oxygen needs.     Expected Discharge Plan: [place] Barriers to Discharge: Continued Medical Work up  Expected Discharge Plan and Services Expected Discharge Plan: [place] arrangements for the past 2 months: Single Family Home                           HH Arranged: RN, PT [facility] Agency: [place] ([facility]) Date [place]: 08/15/19   Representative spoke with at [place]: [place] ([place]) Interventions    Readmission Risk Interventions Readmission Risk Prevention Plan 08/15/2019 08/14/2019  Transportation Screening - Complete  PCP or Specialist Appt within 3-5 Days - Complete  HRI or Home Care Consult Complete (No Data)  Social Work Consult for [place] Planning/Counseling - Complete  Palliative Care Screening - Not Applicable  Medication Review [employer]) - Complete  Some recent data might be hidden

## 2019-08-21 NOTE — TOC Progression Note (Signed)
Transition of Care [facility]) - Progression Note    Patient Details  Name: Tina [name] MRN: [id number] Date of Birth: January 17, 1935  Transition of [facility]) CM/SW Contact  Shelbie Ammons, RN Phone Number: 08/21/2019, 12:15 PM  Clinical Narrative:   RNCM spoke to patient by phone, she is agreeable to going to rehab as she feels like she needs it for a little while. PASSR completed, FL2 completed and SNF bed search started.     Expected Discharge Plan: [place] Barriers to Discharge: Continued Medical Work up  Expected Discharge Plan and Services Expected Discharge Plan: [place] arrangements for the past 2 months: Single Family Home                           HH Arranged: RN, PT [facility] Agency: [place] ([facility]) Date [place]: 08/15/19   Representative spoke with at [place]: [place] ([place]) Interventions    Readmission Risk Interventions Readmission Risk Prevention Plan 08/15/2019 08/14/2019  Transportation Screening - Complete  PCP or Specialist Appt within 3-5 Days - Complete  HRI or Home Care Consult Complete (No Data)  Social Work Consult for [place] Planning/Counseling - Complete  Palliative Care Screening - Not Applicable  Medication Review Press photographer) - Complete  Some recent data might be hidden

## 2019-08-21 NOTE — Progress Notes (Signed)
Palliative:  Patient seen walking hallway with PT, trusted pulmonologist is at bedside.  I return later to discuss goals of care.  Mrs. Pottle greets me making somewhat keeping eye contact.  She is calm and cooperative, able to make her needs known.  She does not appear to be short of breath at this time, and is able to make full sentences with no work of breathing noted.  Her daughter Ella is at bedside, and her Sister Phyllis arrives as we are speaking.  We talked about her work with physical therapy, recommendations for rehab.  Talked about some logistics related to rehab, and I encourage patient and family to work closely with the discharge planner/social worker at her rehab to make sure her needs are met as she is going home.    We talked about being evaluated for oxygen needs today also.  Mrs. Anastasia tells me that she does not think that she can go without oxygen.  We talked about breathlessness, and symptom management.  I share that sometimes people feel as though they cannot catch their breath, but there oxygenation numbers look good.  Mrs. Caldeira tells me that she feels like this is her situation.  We talked about palliative services, experts at symptom management.  Mrs. Waggle continues to decline outpatient services, and I encouraged her to reach out when she feels she is ready.  Patient and family with no questions or concerns at this time.  Conference with Pulmonology, attending, PT, bedside nursing staff and TOC team related to patient condition, needs, goals of care discussion, discharge plan.  Plan: Continue to treat the treatable but no CPR or intubation.  Agreeable to rehab if qualified, [facility] is facility of choice.  Continues to decline outpatient palliative services.  35 minutes Tasha Dove, NP Palliative Medicine Team Team Phone # [phone] Greater than 50% of this time was spent counseling and coordinating care related to the above assessment and plan. 

## 2019-08-21 NOTE — Evaluation (Signed)
Physical Therapy Evaluation Patient Details Name: Tina [name] MRN: [id number] DOB: 17-Jun-1934 Today's Date: 08/21/2019   History of Present Illness  85 yo female with onset of SOB and weakness was re-admitted after recent admit for COPD, had thoracentesis on 08/14/19.   PMHx:  CHF, CAD, emphysema, HTN, COPD, CKD, PN, osteoporosis, bladder CA, lung nodules,   Clinical Impression  Pt was seen for mobility on RW with monitoring of O2 sats, noted her tolerance from sats was good with elevation of pulse from 70 to 96 during mobility.  Pt is also requiring some assist with direction and setting limits, but with all her lines and being on 4L O2 she is not a good candidate for stairs yet.  Will reconsider tomorrow depending on the number of devices and restrictions she has for getting up half a flight.  Follow acutely for strength, balance and endurance needs, to shorten stay in SNF.    Follow Up Recommendations SNF    Equipment Recommendations  None recommended by PT    Recommendations for Other Services       Precautions / Restrictions Precautions Precautions: Fall Precaution Comments: monitor O2 sats Restrictions Weight Bearing Restrictions: No      Mobility  Bed Mobility Overal bed mobility: Needs Assistance Bed Mobility: Supine to Sit     Supine to sit: Min assist     General bed mobility comments: cued hand placement  Transfers Overall transfer level: Needs assistance Equipment used: Rolling walker (2 wheeled);1 person hand held assist Transfers: Sit to/from Stand Sit to Stand: Mod assist         General transfer comment: mod to power up but then more steady  Ambulation/Gait Ambulation/Gait assistance: Min assist;Min guard Gait Distance (Feet): 100 Feet Assistive device: Rolling walker (2 wheeled) Gait Pattern/deviations: Step-through pattern;Decreased stride length;Wide base of support;Trunk flexed Gait velocity: reduced Gait velocity interpretation: <1.31 ft/sec,  indicative of household ambulator General Gait Details: pt is maintaining O2 sats at 95% or greater but upon seeing any distraction lists to the left and must be steadied by PT  Stairs            Wheelchair Mobility    Modified Rankin (Stroke Patients Only)       Balance Overall balance assessment: Needs assistance Sitting-balance support: Feet supported Sitting balance-Leahy Scale: Fair     Standing balance support: Bilateral upper extremity supported;During functional activity Standing balance-Leahy Scale: Fair Standing balance comment: less than fair dynamic standing                             Pertinent Vitals/Pain Pain Assessment: No/denies pain    Home Living Family/patient expects to be discharged to:: Private residence Living Arrangements: Children Available Help at Discharge: Family;Available 24 hours/day Type of Home: House Home Access: Stairs to enter Entrance Stairs-Rails: None Entrance Stairs-Number of Steps: 1 Home Layout: Two level Home Equipment: Walker - 2 wheels;Walker - 4 wheels Additional Comments: pt is driving and does her own housework    Prior Function Level of Independence: Independent with assistive device(s)         Comments: RW for mobility     Hand Dominance        Extremity/Trunk Assessment   Upper Extremity Assessment Upper Extremity Assessment: Generalized weakness    Lower Extremity Assessment Lower Extremity Assessment: Generalized weakness    Cervical / Trunk Assessment Cervical / Trunk Assessment: Kyphotic  Communication   Communication: No difficulties  Cognition Arousal/Alertness: Awake/alert Behavior During Therapy: WFL for tasks assessed/performed Overall Cognitive Status: Within Functional Limits for tasks assessed                                        General Comments General comments (skin integrity, edema, etc.): pt is demonstrating distracted LOB on RW and is  requiring help with O2 and other lines.  Has a need for rehab since her son has not been assisting her with mobility    Exercises     Assessment/Plan    PT Assessment Patient needs continued PT services  PT Problem List Decreased strength;Decreased range of motion;Decreased activity tolerance;Decreased balance;Decreased mobility;Decreased coordination;Decreased safety awareness;Cardiopulmonary status limiting activity       PT Treatment Interventions DME instruction;Gait training;Stair training;Functional mobility training;Therapeutic activities;Therapeutic exercise;Balance training;Neuromuscular re-education;Patient/family education    PT Goals (Current goals can be found in the Care Plan section)  Acute Rehab PT Goals Patient Stated Goal: get home with son PT Goal Formulation: With patient Time For Goal Achievement: 09/04/19 Potential to Achieve Goals: Good    Frequency Min 2X/week   Barriers to discharge Inaccessible home environment;Decreased caregiver support home with stairs to go up to her bedroom    Co-evaluation               AM-PAC PT "6 Clicks" Mobility  Outcome Measure Help needed turning from your back to your side while in a flat bed without using bedrails?: A Little Help needed moving from lying on your back to sitting on the side of a flat bed without using bedrails?: A Little Help needed moving to and from a bed to a chair (including a wheelchair)?: A Little Help needed standing up from a chair using your arms (e.g., wheelchair or bedside chair)?: A Little Help needed to walk in hospital room?: A Little Help needed climbing 3-5 steps with a railing? : A Lot 6 Click Score: 17    End of Session Equipment Utilized During Treatment: Gait belt;Oxygen Activity Tolerance: Patient tolerated treatment well;Treatment limited secondary to medical complications (Comment) Patient left: in chair;with call bell/phone within reach;with nursing/sitter in room Nurse  Communication: Mobility status PT Visit Diagnosis: Unsteadiness on feet (R26.81);Muscle weakness (generalized) (M62.81);Difficulty in walking, not elsewhere classified (R26.2);Ataxic gait (R26.0)    Time: 5170-0174 PT Time Calculation (min) (ACUTE ONLY): 28 min   Charges:   PT Evaluation $PT Eval Moderate Complexity: 1 Mod PT Treatments $Gait Training: 8-22 mins       Ramond Dial 08/21/2019, 11:06 AM  Mee Hives, PT MS Acute Rehab Dept. Number: [place]

## 2019-08-21 NOTE — NC FL2 (Signed)
[place] LEVEL OF CARE SCREENING TOOL     IDENTIFICATION  Patient Name: Tina [name] Birthdate: 1934/08/10 Sex: female Admission Date (Current Location): 08/13/2019  [place] and [place] Number:  Engineering geologist and Address:  [facility], [address] [place], [place] [address]      Provider Number: [id number]  Attending Physician Name and Address:  Edwin Dada, *  Relative Name and Phone Number:       Current Level of Care:   Recommended Level of Care: [place] Prior Approval Number:    Date Approved/Denied:   PASRR Number: [id number]  Discharge Plan: SNF    Current Diagnoses: Patient Active Problem List   Diagnosis Date Noted  . Palliative care by specialist   . DNR (do not resuscitate) discussion   . Pleural effusion, left 08/17/2019  . Peripheral neuropathy 08/17/2019  . History of pulmonary embolism 08/17/2019  . Dyspnea 08/13/2019  . COPD with acute exacerbation ([place]) 08/02/2019  . Acute on chronic diastolic CHF (congestive heart failure) ([place]) 08/02/2019  . Abnormality of gait 08/02/2019  . Renal artery stenosis ([place]) 07/01/2019  . DOE (dyspnea on exertion) 05/25/2019  . Diarrhea 05/10/2019  . AKI (acute kidney injury) ([place])   . Hypokalemia   . Foot drop 04/02/2019  . Low back pain 03/16/2019  . Leg pain 03/16/2019  . Aortic atherosclerosis ([place]) 01/07/2019  . Hoarseness 06/23/2018  . Unsteadiness 05/11/2018  . CHF (congestive heart failure) ([place]) 10/20/2017  . Pulmonary embolus ([place]) 06/28/2017  . 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 Goals of care, counseling/discussion 03/02/2017  . Abnormal computed tomography of gastrointestinal tract   . Noninfectious diarrhea   . Ulceration of intestine   . Hernia, hiatal   . Bladder mass   . Mesenteric ischemia due to arterial insufficiency ([place])   . Polycythemia vera ([place]) 10/21/2016  . Elevated troponin 06/14/2016   . CKD (chronic kidney disease), stage III ([place]) 06/14/2016  . Leukocytosis 06/14/2016  . Abdominal bruit 04/16/2016  . Abnormal mammogram 06/06/2015  . Essential hypertension 02/16/2015  . Numbness 01/24/2015  . Personal history of tobacco use, presenting hazards to health 11/25/2014  . BRCA positive   . COPD exacerbation ([place]) 11/14/2014  . Sensation of pressure in bladder area 08/15/2014  . Health care maintenance 08/15/2014  . Presence of coronary angioplasty implant and graft 09/30/2013  . History of colonic polyps 08/25/2012  . Female genuine stress incontinence 06/28/2012  . Infection of urinary tract 06/28/2012  . CAD (coronary artery disease) 05/23/2012  . COPD, severe ([place]) 05/23/2012  . Hypercholesterolemia 05/23/2012  . Hyperglycemia 05/23/2012  . Polycythemia, secondary 05/23/2012  . Renal cyst 05/23/2012  . Osteoporosis 05/23/2012    Orientation RESPIRATION BLADDER Height & Weight     Self, Time, Situation, Place  O2(possible) Continent Weight: 62.2 kg Height:  '5\' 5"'  (165.1 cm)  BEHAVIORAL SYMPTOMS/MOOD NEUROLOGICAL BOWEL NUTRITION STATUS      Continent Diet(Heart Healthy)  AMBULATORY STATUS COMMUNICATION OF NEEDS Skin   Limited Assist Verbally Normal                       Personal Care Assistance Level of Assistance  Dressing, Bathing, Feeding Bathing Assistance: Limited assistance Feeding assistance: Limited assistance Dressing Assistance: Limited assistance     Functional Limitations Info  Sight, Hearing, Speech Sight Info: Adequate Hearing Info: Adequate Speech Info: Adequate    SPECIAL CARE FACTORS FREQUENCY  OT (  By licensed OT), PT (By licensed PT)                    Contractures Contractures Info: Not present    Additional Factors Info  Code Status, Allergies Code Status Info: DNR Allergies Info: Evista, Fluticasone           Current Medications (08/21/2019):  This is the current hospital active medication list Current  Facility-Administered Medications  Medication Dose Route Frequency Provider Last Rate Last Admin  . acetaminophen (TYLENOL) tablet 1,000 mg  1,000 mg Oral Q8H PRN Enzo Bi, MD   1,000 mg at 08/15/19 2035  . albuterol (PROVENTIL) (2.5 MG/3ML) 0.083% nebulizer solution 2.5 mg  2.5 mg Nebulization Q3H PRN Enzo Bi, MD      . alum & mag hydroxide-simeth (MAALOX/MYLANTA) 200-200-20 MG/5ML suspension 15 mL  15 mL Oral Q6H PRN Enzo Bi, MD   15 mL at 08/21/19 0002  . amLODipine (NORVASC) tablet 10 mg  10 mg Oral Daily Enzo Bi, MD   10 mg at 08/21/19 1049  . aspirin EC tablet 81 mg  81 mg Oral Daily Enzo Bi, MD   81 mg at 08/21/19 1026  . budesonide (PULMICORT) nebulizer solution 0.5 mg  0.5 mg Nebulization BID Enzo Bi, MD   0.5 mg at 08/21/19 0801  . calcium carbonate (TUMS - dosed in mg elemental calcium) chewable tablet 200 mg of elemental calcium  1 tablet Oral TID PRN Enzo Bi, MD   200 mg of elemental calcium at 08/18/19 1107  . Chlorhexidine Gluconate Cloth 2 % PADS 6 each  6 each Topical Daily Enzo Bi, MD   6 each at 08/21/19 1028  . clopidogrel (PLAVIX) tablet 75 mg  75 mg Oral Daily Enzo Bi, MD   75 mg at 08/21/19 1028  . diazepam (VALIUM) tablet 1 mg  1 mg Oral Once Enzo Bi, MD      . docusate sodium (COLACE) capsule 100 mg  100 mg Oral BID PRN Enzo Bi, MD      . enoxaparin (LOVENOX) injection 40 mg  40 mg Subcutaneous Q24H Enzo Bi, MD   40 mg at 08/20/19 2224  . furosemide (LASIX) 250 mg in dextrose 5 % 250 mL (1 mg/mL) infusion  4 mg/hr Intravenous Continuous Enzo Bi, MD 4 mL/hr at 08/21/19 1029 4 mg/hr at 08/21/19 1029  . furosemide (LASIX) injection 40 mg  40 mg Intravenous Once Enzo Bi, MD      . gabapentin (NEURONTIN) capsule 200 mg  200 mg Oral QHS Enzo Bi, MD   200 mg at 08/20/19 2224  . guaiFENesin-dextromethorphan (ROBITUSSIN DM) 100-10 MG/5ML syrup 10 mL  10 mL Oral Q6H PRN Enzo Bi, MD      . hydrALAZINE (APRESOLINE) injection 10 mg  10 mg Intravenous Q6H  PRN Enzo Bi, MD   10 mg at 08/17/19 0053  . hydrALAZINE (APRESOLINE) tablet 100 mg  100 mg Oral TID Enzo Bi, MD   100 mg at 08/21/19 1050  . ipratropium-albuterol (DUONEB) 0.5-2.5 (3) MG/3ML nebulizer solution 3 mL  3 mL Nebulization TID Enzo Bi, MD   3 mL at 08/21/19 0801  . labetalol (NORMODYNE) tablet 200 mg  200 mg Oral BID Enzo Bi, MD   200 mg at 08/21/19 1050  . LORazepam (ATIVAN) injection 0.5 mg  0.5 mg Intravenous Q6H PRN Enzo Bi, MD   0.5 mg at 08/18/19 1745  . losartan (COZAAR) tablet 100 mg  100 mg Oral Daily Billie Ruddy,  Otila Kluver, MD   100 mg at 08/21/19 1050  . ondansetron (ZOFRAN) injection 4 mg  4 mg Intravenous Q6H PRN Enzo Bi, MD      . ondansetron (ZOFRAN-ODT) disintegrating tablet 4 mg  4 mg Oral Q8H PRN Enzo Bi, MD      . polyethylene glycol (MIRALAX / GLYCOLAX) packet 17 g  17 g Oral BID PRN Enzo Bi, MD      . pravastatin (PRAVACHOL) tablet 40 mg  40 mg Oral q1800 Enzo Bi, MD   40 mg at 08/20/19 1745  . predniSONE (DELTASONE) tablet 40 mg  40 mg Oral Q breakfast Enzo Bi, MD   40 mg at 08/21/19 0755  . roflumilast (DALIRESP) tablet 250 mcg  250 mcg Oral Daily Enzo Bi, MD   250 mcg at 08/21/19 1028   Facility-Administered Medications Ordered in Other Encounters  Medication Dose Route Frequency Provider Last Rate Last Admin  . heparin lock flush 100 unit/mL  500 Units Intravenous Once Sindy Guadeloupe, MD      . sodium chloride flush (NS) 0.9 % injection 10 mL  10 mL Intravenous PRN Sindy Guadeloupe, MD         Discharge Medications: Please see discharge summary for a list of discharge medications.  Relevant Imaging Results:  Relevant Lab Results:   Additional Information SS# [id number]  Shelbie Ammons, RN

## 2019-08-21 NOTE — Progress Notes (Signed)
SATURATION QUALIFICATIONS: (This note is used to comply with regulatory documentation for home oxygen)  Patient Saturations on Room Air at Rest = 95%  Patient Saturations on Room Air while Ambulating = 88%  Patient Saturations on 2 Liters of oxygen while Ambulating = 97%  Please briefly explain why patient needs home oxygen: She needs home oxygen because her O2 saturation drops when she exerts herself with movement or ambulating.

## 2019-08-21 NOTE — Progress Notes (Signed)
Pt with dressing change orders. Pt has no dressing, wound or incision. Notified NP to request d/c of order. Order d/c as per NP, B. Randol Kern.

## 2019-08-21 NOTE — Progress Notes (Addendum)
This patients blood pressure is 157/47 map 88 hr 76 , she has Losartan, Hydralazine, Amlodipine and Norvasc due now and she is on a Lasix gtt at 4, messaged Dr. Loleta Books about same and asked if he wanted me to hold any of these medications. I have been waiting since 9am and her pressure has not increased only her systolic. Dr. Loleta Books said it was okay to give all of her blood pressure medications. All were given and documented in [facility].

## 2019-08-21 NOTE — Progress Notes (Signed)
Date: 08/21/2019,   MRN# [id number] CHANNA HAZELETT [birth year] Code Status:     Code Status Orders  (From admission, onward)         Start     Ordered   08/13/19 1827  Do not attempt resuscitation (DNR)  Continuous    Question Answer Comment  In the event of cardiac or respiratory ARREST Do not call a "code blue"   In the event of cardiac or respiratory ARREST Do not perform Intubation, CPR, defibrillation or ACLS   In the event of cardiac or respiratory ARREST Use medication by any route, position, wound care, and other measures to relive pain and suffering. May use oxygen, suction and manual treatment of airway obstruction as needed for comfort.   Comments Code status was discussed with patient and she wishes to be placed on a DNR status      08/13/19 1826        Code Status History    Date Active Date Inactive Code Status Order ID Comments User Context   08/02/2019 1627 08/06/2019 2158 DNR [id number]  Collier Bullock, MD ED   08/02/2019 1547 08/02/2019 1627 Full Code [id number]  Collier Bullock, MD ED   05/10/2019 1747 05/14/2019 0108 DNR [id number]  Lorella Nimrod, MD ED   10/21/2017 1101 10/22/2017 1529 DNR [id number]  Bettey Costa, MD Inpatient   10/20/2017 1405 10/21/2017 1101 Full Code [id number]  Idelle Crouch, MD Inpatient   02/11/2017 2018 02/15/2017 1534 DNR [id number]  Fritzi Mandes, MD Inpatient   08/21/2016 2204 08/23/2016 1511 Full Code [id number]  Henreitta Leber, MD Inpatient   06/14/2016 2028 06/17/2016 1621 Full Code [id number]  Theodoro Grist, MD Inpatient   11/14/2014 0857 11/15/2014 1557 Full Code [id number]  Aldean Jewett, MD Inpatient   Advance Care Planning Activity    Advance Directive Documentation     Most Recent Value  Type of Advance Directive  Healthcare Power of Attorney  Pre-existing out of facility DNR order (yellow form or pink MOST form)  --  "MOST" Form in Place?  --      HPI: ambulating in th corridor with pt this am, she is motivated, to [facility] rehab. No new  compliants, no cp, edmam fever, wheezing , eaily dyspneic with minimum activity. Again made known she would be better served at rehab, spoke with palliative. PMHX:   Past Medical History:  Diagnosis Date  . Anemia   . Asthma   . Bladder cancer ([place])   . BRCA positive   . CAD (coronary artery disease)   . CHF (congestive heart failure) ([place])   . COPD (chronic obstructive pulmonary disease) ([place])   . Emphysema lung ([place])   . GERD (gastroesophageal reflux disease)   . History of colon polyps   . Hypercholesterolemia   . Hyperglycemia   . Hypertension   . Lung nodules   . Osteoporosis   . Personal history of tobacco use, presenting hazards to health 11/25/2014  . Polycythemia vera(238.4)   . Renal cyst   . Skin cancer    Surgical Hx:  Past Surgical History:  Procedure Laterality Date  . ANGIOPLASTY     coronary (x1)  . COLONOSCOPY WITH PROPOFOL N/A 03/02/2015   Procedure: COLONOSCOPY WITH PROPOFOL;  Surgeon: Lollie Sails, MD;  Location: [facility] ENDOSCOPY;  Service: Endoscopy;  Laterality: N/A;  . COLONOSCOPY WITH PROPOFOL N/A 02/13/2017   Procedure: COLONOSCOPY WITH PROPOFOL;  Surgeon: [name], MD;  Location: [facility] ENDOSCOPY;  Service: Endoscopy;  Laterality: N/A;  .  CORONARY ANGIOPLASTY    . CORONARY ARTERY BYPASS GRAFT  09/17/2017   pt denies  . CYSTOSCOPY W/ RETROGRADES Bilateral 02/13/2017   Procedure: CYSTOSCOPY WITH RETROGRADE PYELOGRAM;  Surgeon: Hollice Espy, MD;  Location: [facility] ORS;  Service: Urology;  Laterality: Bilateral;  . CYSTOSCOPY W/ RETROGRADES Bilateral 09/19/2017   Procedure: CYSTOSCOPY WITH RETROGRADE PYELOGRAM;  Surgeon: Hollice Espy, MD;  Location: [facility] ORS;  Service: Urology;  Laterality: Bilateral;  . CYSTOSCOPY WITH BIOPSY  03/26/2017   Procedure: CYSTOSCOPY WITH BIOPSY;  Surgeon: Hollice Espy, MD;  Location: [facility] ORS;  Service: Urology;;  . Consuela Mimes WITH STENT PLACEMENT Bilateral 02/13/2017   Procedure: CYSTOSCOPY WITH STENT PLACEMENT;   Surgeon: Hollice Espy, MD;  Location: [facility] ORS;  Service: Urology;  Laterality: Bilateral;  . CYSTOSCOPY WITH STENT PLACEMENT Bilateral 03/26/2017   Procedure: CYSTOSCOPY WITH STENT EXCHANGE;  Surgeon: Hollice Espy, MD;  Location: [facility] ORS;  Service: Urology;  Laterality: Bilateral;  . ESOPHAGOGASTRODUODENOSCOPY (EGD) WITH PROPOFOL N/A 02/13/2017   Procedure: ESOPHAGOGASTRODUODENOSCOPY (EGD) WITH PROPOFOL;  Surgeon: [name], MD;  Location: [facility] ENDOSCOPY;  Service: Endoscopy;  Laterality: N/A;  . PORTA CATH INSERTION N/A 02/28/2017   Procedure: PORTA CATH INSERTION;  Surgeon: Algernon Huxley, MD;  Location: [place] CV LAB;  Service: Cardiovascular;  Laterality: N/A;  . PORTA CATH REMOVAL N/A 12/24/2017   Procedure: PORTA CATH REMOVAL;  Surgeon: Algernon Huxley, MD;  Location: [place] CV LAB;  Service: Cardiovascular;  Laterality: N/A;  . SALPINGOOPHORECTOMY    . TONSILECTOMY/ADENOIDECTOMY WITH MYRINGOTOMY    . TONSILLECTOMY    . TRANSURETHRAL RESECTION OF BLADDER TUMOR N/A 02/13/2017   Procedure: TRANSURETHRAL RESECTION OF BLADDER TUMOR (TURBT);  Surgeon: Hollice Espy, MD;  Location: [facility] ORS;  Service: Urology;  Laterality: N/A;  . TUBAL LIGATION    . UPPER GI ENDOSCOPY  02/13/2017  . URETEROSCOPY Left 09/19/2017   Procedure: URETEROSCOPY;  Surgeon: Hollice Espy, MD;  Location: [facility] ORS;  Service: Urology;  Laterality: Left;  Marland Kitchen VISCERAL ANGIOGRAPHY N/A 01/23/2019   Procedure: VISCERAL ANGIOGRAPHY;  Surgeon: Algernon Huxley, MD;  Location: [place] CV LAB;  Service: Cardiovascular;  Laterality: N/A;  . VISCERAL ANGIOGRAPHY N/A 06/09/2019   Procedure: VISCERAL ANGIOGRAPHY;  Surgeon: Algernon Huxley, MD;  Location: [place] CV LAB;  Service: Cardiovascular;  Laterality: N/A;  . VISCERAL ARTERY INTERVENTION N/A 02/12/2017   Procedure: VISCERAL ARTERY INTERVENTION;  Surgeon: Algernon Huxley, MD;  Location: [place] CV LAB;  Service: Cardiovascular;  Laterality: N/A;   Family  Hx:  Family History  Problem Relation Age of Onset  . Cirrhosis Father        died age 56  . Alcohol abuse Father   . Asthma Mother   . Congestive Heart Failure Mother   . Breast cancer Mother        2 (1/2 sisters)  . Osteoarthritis Mother   . Colon cancer Mother   . Lupus Sister   . Alcohol abuse Sister   . Ovarian cancer Sister   . Osteoporosis Sister   . Skin cancer Sister   . Breast cancer Cousin   . Breast cancer Maternal Aunt    Social Hx:   Social History   Tobacco Use  . Smoking status: Former Smoker    Packs/day: 1.00    Years: 45.00    Pack years: 45.00    Types: Cigarettes    Quit date: 11/11/2003    Years since quitting: 15.7  . Smokeless tobacco: Never Used  Substance Use Topics  . Alcohol use: Not Currently    Alcohol/week: 0.0 standard drinks  . Drug use: No   Medication:    Home Medication:    Current Medication: '@CURMEDTAB' @   Allergies:  Evista [raloxifene] and Fluticasone-salmeterol  Review of Systems: Gen:  Denies  fever, sweats, chills HEENT: Denies blurred vision, double vision, ear pain, eye pain, hearing loss, nose bleeds, sore throat Cvc:  No dizziness, chest pain or heaviness Resp: sob with minimum activity   Gi: Denies swallowing difficulty, stomach pain, nausea or vomiting, diarrhea, constipation, bowel incontinence Gu:  Denies bladder incontinence, burning urine Ext:   No Joint pain, stiffness or swelling Skin: No skin rash, easy bruising or bleeding or hives Endoc:  No polyuria, polydipsia , polyphagia or weight change Psych: No depression, insomnia or hallucinations  Other:  All other systems negative  Physical Examination:   VS: BP (!) 124/33 (BP Location: Right Arm)   Pulse 61   Temp 98.7 F (37.1 C) (Oral)   Resp 20   Ht '5\' 5"'  (1.651 m)   Wt 62.2 kg   SpO2 95%   BMI 22.82 kg/m   General Appearance: No distress  Neuro: without focal findings, mental status, speech normal, alert and oriented, cranial nerves 2-12  intact, reflexes normal and symmetric, sensation grossly normal  HEENT: PERRLA, EOM intact, no ptosis, no other lesions noticed, Mallampati: Pulmonary:.No wheezing, No rales  Sputum Production:   Cardiovascular:  Normal S1,S2.  No m/r/g.  Abdominal aorta pulsation normal.    Abdomen:Benign, Soft, non-tender, No masses, hepatosplenomegaly, No lymphadenopathy Endoc: No evident thyromegaly, no signs of acromegaly or Cushing features Skin:   warm, no rashes, no ecchymosis  Extremities: normal, no cyanosis, clubbing, no edema, warm with normal capillary refill. Other findings:   Labs results:   Recent Labs    08/19/19 0556 08/20/19 0413 08/21/19 0432  HGB 12.5 12.8 12.3  HCT 37.1 37.9 36.7  MCV 90.0 90.5 90.0  WBC 15.6* 13.3* 12.4*  BUN 33* 27* 33*  CREATININE 0.89 0.82 0.89  GLUCOSE 140* 112* 124*  CALCIUM 8.5* 8.6* 8.8*  ,     Assessment and Plan:   Advanced copd, weak, dyspneic with minimum activity, acute copd excerabtion and anxiety controlled. Agree with plans for d/c tomorrow, on present regimen. thanks    I have personally obtained a history, examined the patient, evaluated laboratory and imaging results, formulated the assessment and plan and placed orders.  The Patient requires high complexity decision making for assessment and support, frequent evaluation and titration of therapies, application of advanced monitoring technologies and extensive interpretation of multiple databases.   Melitta Tigue,M.D. Pulmonary & Critical care Medicine [facility]

## 2019-08-21 NOTE — TOC Progression Note (Signed)
Transition of Care [facility]) - Progression Note    Patient Details  Name: Tina [name] MRN: [id number] Date of Birth: November 19, 1934  [facility]) CM/SW Contact  Zaiyden Strozier, Gardiner Rhyme, LCSW Phone Number: 08/21/2019, 3:35 PM  Clinical Narrative:   Met with pt and her two daughter's while here visiting to ask preference regarding bed offers-[facility], [facility], [facility]. She has chosen [facility] aware plan to transfer tomorrow. MD aware need new COIVD test. Will work on transfer tomorrow. Made aware will go non-emergency ambulance.     Expected Discharge Plan: [place] Barriers to Discharge: Continued Medical Work up  Expected Discharge Plan and Services Expected Discharge Plan: [place] arrangements for the past 2 months: Single Family Home                           HH Arranged: RN, PT [facility] Agency: [facility] ([facility]) Date [place]: 08/15/19   Representative spoke with at [place]: [place] ([place]) Interventions    Readmission Risk Interventions Readmission Risk Prevention Plan 08/15/2019 08/14/2019  Transportation Screening - Complete  PCP or Specialist Appt within 3-5 Days - Complete  HRI or Home Care Consult Complete (No Data)  Social Work Consult for [place] Planning/Counseling - Complete  Palliative Care Screening - Not Applicable  Medication Review Press photographer) - Complete  Some recent data might be hidden

## 2019-08-21 NOTE — Progress Notes (Signed)
PROGRESS NOTE    Tina [name]  NTZ:[id number] DOB: 03-20-1935 DOA: 08/13/2019 PCP: Einar Pheasant, MD      Brief Narrative:  Tina [name] is a 85 y.o. F with COPD, HTN, dCHF< CAD, CKD IIIb who presented with progressive dyspnea on exertion.  Patient was recently admitted for 5 days, treated with steroids for COPD exacerbation, and was discharged continue oral prednisone.  After returning home, her symptoms worsen again so she came back to the ER.  In the ER, chest x-ray showed pleural effusion, leukocytosis.  She was started on steroids, bronchodilators, and Lasix.        Assessment & Plan:  COPD exacerbation Chronic hypoxic respiratory failure Acute hypoxic respiratory failure ruled out.  Patient desaturated to 88% with exertion today on room air, after applying 2 L oxygen, her O2 sat returned to 93%. -Continue steroids -Continue bronchodilators -Continue Roflumilast  Acute on chronic diastolic CHF Moderate left pleural effusion Echocardiogram last month showed EF 50 to 55% with grade 1 diastolic dysfunction.  She had worsening pleural effusion on admission. Net negative 5.9 L on admission, 300 mL yesterday, creatinine stable, potassium slightly low appears euvolemic on exam -Stop Lasix drip -Resume torsemide at discharge  History of pulmonary embolism Completed 6 months of anticoagulant  CKD stage IIIb  Hypertension Coronary artery disease, secondary prevention Peripheral vascular disease with stent placement SMA artery January 2021 -Continue aspirin, Plavix -Continue pravastatin -Continue losartan, labetalol, hydralazine, amlodipine  Peripheral neuropathy -Continue gabapentin  Hypokalemia -Supplement potassium   Protein calorie malnutrition As evidenced by chronic lung disease, poor oral intake, and severe depletion of subcutaneous muscle mass and fat, BMI 22.           Disposition: The patient was admitted with dyspnea due to COPD exacerbation,  CHF, and pleural effusions.  She has been diuresed, her COPD exacerbation has been treated with steroids, and she is qualified for oxygen.  She remains extremely dyspneic with minimal exertion, and will need extensive rehabilitation at discharge. I will discharge when we have found a safe disposition.        MDM: The below labs and imaging reports were reviewed and summarized above.  Medication management as above.  This is a severe exacerbation of her chronic diseases.    DVT prophylaxis: Lovenox Code Status: Full code Family Communication: Family members at the bedside    Consultants:   Pulmonology  Procedures:   4/1 left-sided thoracentesis  Antimicrobials:   Azithromycin x5 days  Culture data:              Subjective: Patient still feeling dyspneic with exertion.  She has had no wheezing, hemoptysis, chest pain.  Objective: Vitals:   08/21/19 0011 08/21/19 0800 08/21/19 0853 08/21/19 1024  BP: (!) 114/52 (!) 161/49 (!) 131/48 (!) 157/47  Pulse: 71 75 68   Resp: (!) 22 20    Temp: (!) 97.4 F (36.3 C) 98.6 F (37 C)    TempSrc: Oral Oral    SpO2: 97% 99%    Weight:      Height:        Intake/Output Summary (Last 24 hours) at 08/21/2019 1540 Last data filed at 08/21/2019 1342 Gross per 24 hour  Intake 120 ml  Output 1300 ml  Net -1180 ml   Filed Weights   08/13/19 1228 08/16/19 2313  Weight: 62 kg 62.2 kg    Examination: General appearance:  adult female, alert and in no acute distress.  Appears tired, lying in bed.  HEENT: Anicteric, conjunctiva pink, lids and lashes normal. No nasal deformity, discharge, epistaxis.  Lips dry, hearing diminished.   Skin: Warm and dry.  No jaundice.  No suspicious rashes or lesions. Cardiac: RRR, nl S1-S2, no murmurs appreciated.  Capillary refill is brisk.  JVP normal.  No LE edema.  Radial pulses 2+ and symmetric. Respiratory: Normal respiratory rate and rhythm.  CTAB without rales or wheezes.  Diminished  at bilateral bases. Abdomen: Abdomen soft.  No TTP or guarding. No ascites, distension, hepatosplenomegaly.   MSK: No deformities or effusions.  Diffuse loss of subcutaneous muscle mass intact Neuro: Awake and alert.  EOMI, moves all extremities. Speech fluent.    Psych: Sensorium intact and responding to questions, attention normal. Affect normal.  Judgment and insight appear normal.    Data Reviewed: I have personally reviewed following labs and imaging studies:  CBC: Recent Labs  Lab 08/17/19 0354 08/18/19 0452 08/19/19 0556 08/20/19 0413 08/21/19 0432  WBC 18.6* 16.0* 15.6* 13.3* 12.4*  HGB 12.4 12.2 12.5 12.8 12.3  HCT 36.0 35.0* 37.1 37.9 36.7  MCV 89.1 89.3 90.0 90.5 90.0  PLT 177 161 153 152 709   Basic Metabolic Panel: Recent Labs  Lab 08/17/19 0354 08/18/19 0452 08/19/19 0556 08/20/19 0413 08/21/19 0432  NA 136 140 138 137 137  K 4.3 3.2* 3.2* 3.2* 3.2*  CL 98 96* 93* 93* 91*  CO2 27 33* 33* 33* 36*  GLUCOSE 180* 161* 140* 112* 124*  BUN 39* 35* 33* 27* 33*  CREATININE 1.14* 0.96 0.89 0.82 0.89  CALCIUM 9.2 8.8* 8.5* 8.6* 8.8*  MG 2.5* 2.5* 2.7* 2.4 2.5*  PHOS  --   --  2.7  --   --    GFR: Estimated Creatinine Clearance: 43.9 mL/min (by C-G formula based on SCr of 0.89 mg/dL). Liver Function Tests: No results for input(s): AST, ALT, ALKPHOS, BILITOT, PROT, ALBUMIN in the last 168 hours. No results for input(s): LIPASE, AMYLASE in the last 168 hours. No results for input(s): AMMONIA in the last 168 hours. Coagulation Profile: No results for input(s): INR, PROTIME in the last 168 hours. Cardiac Enzymes: No results for input(s): CKTOTAL, CKMB, CKMBINDEX, TROPONINI in the last 168 hours. BNP (last 3 results) No results for input(s): PROBNP in the last 8760 hours. HbA1C: No results for input(s): HGBA1C in the last 72 hours. CBG: Recent Labs  Lab 08/16/19 2310  GLUCAP 205*   Lipid Profile: No results for input(s): CHOL, HDL, LDLCALC, TRIG, CHOLHDL,  LDLDIRECT in the last 72 hours. Thyroid Function Tests: No results for input(s): TSH, T4TOTAL, FREET4, T3FREE, THYROIDAB in the last 72 hours. Anemia Panel: No results for input(s): VITAMINB12, FOLATE, FERRITIN, TIBC, IRON, RETICCTPCT in the last 72 hours. Urine analysis:    Component Value Date/Time   COLORURINE YELLOW (A) 08/05/2019 1257   APPEARANCEUR CLEAR (A) 08/05/2019 1257   APPEARANCEUR Clear 09/13/2017 1502   LABSPEC 1.016 08/05/2019 1257   PHURINE 5.0 08/05/2019 1257   GLUCOSEU NEGATIVE 08/05/2019 1257   GLUCOSEU NEGATIVE 10/09/2017 1121   HGBUR NEGATIVE 08/05/2019 1257   BILIRUBINUR NEGATIVE 08/05/2019 1257   BILIRUBINUR Negative 09/13/2017 1502   KETONESUR NEGATIVE 08/05/2019 1257   PROTEINUR NEGATIVE 08/05/2019 1257   UROBILINOGEN 0.2 10/09/2017 1121   NITRITE NEGATIVE 08/05/2019 1257   LEUKOCYTESUR NEGATIVE 08/05/2019 1257   Sepsis Labs: @LABRCNTIP (procalcitonin:4,lacticacidven:4)  ) Recent Results (from the past 240 hour(s))  SARS CORONAVIRUS 2 (TAT 6-24 HRS) Nasopharyngeal Nasopharyngeal Swab     Status: None  Collection Time: 08/13/19  2:46 PM   Specimen: Nasopharyngeal Swab  Result Value Ref Range Status   SARS Coronavirus 2 NEGATIVE NEGATIVE Final    Comment: (NOTE) SARS-CoV-2 target nucleic acids are NOT DETECTED. The SARS-CoV-2 RNA is generally detectable in upper and lower respiratory specimens during the acute phase of infection. Negative results do not preclude SARS-CoV-2 infection, do not rule out co-infections with other pathogens, and should not be used as the sole basis for treatment or other patient management decisions. Negative results must be combined with clinical observations, patient history, and epidemiological information. The expected result is Negative. Fact Sheet for Patients: SugarRoll.be Fact Sheet for Healthcare Providers: https://www.woods-mathews.com/ This test is not yet approved or  cleared by the [place] FDA and  has been authorized for detection and/or diagnosis of SARS-CoV-2 by FDA under an Emergency Use Authorization (EUA). This EUA will remain  in effect (meaning this test can be used) for the duration of the COVID-19 declaration under Section 56 4(b)(1) of the Act, 21 U.S.C. section 360bbb-3(b)(1), unless the authorization is terminated or revoked sooner. Performed at [facility], [place] [address] [place], [place] [address]   MRSA PCR Screening     Status: None   Collection Time: 08/16/19 11:05 PM   Specimen: Nasopharyngeal  Result Value Ref Range Status   MRSA by PCR NEGATIVE NEGATIVE Final    Comment:        The GeneXpert MRSA Assay (FDA approved for NASAL specimens only), is one component of a comprehensive MRSA colonization surveillance program. It is not intended to diagnose MRSA infection nor to guide or monitor treatment for MRSA infections. Performed at [facility], [address] [place], [place] [address]          Radiology Studies: No results found.      Scheduled Meds: . amLODipine  10 mg Oral Daily  . aspirin EC  81 mg Oral Daily  . budesonide (PULMICORT) nebulizer solution  0.5 mg Nebulization BID  . Chlorhexidine Gluconate Cloth  6 each Topical Daily  . clopidogrel  75 mg Oral Daily  . diazepam  1 mg Oral Once  . enoxaparin (LOVENOX) injection  40 mg Subcutaneous Q24H  . furosemide  40 mg Intravenous Once  . gabapentin  200 mg Oral QHS  . hydrALAZINE  100 mg Oral TID  . ipratropium-albuterol  3 mL Nebulization TID  . labetalol  200 mg Oral BID  . losartan  100 mg Oral Daily  . pravastatin  40 mg Oral q1800  . predniSONE  40 mg Oral Q breakfast  . roflumilast  250 mcg Oral Daily   Continuous Infusions:   LOS: 8 days    Time spent: 35 minutes    Edwin Dada, MD [facility] 08/21/2019, 3:40 PM     Please page though [place] or Epic secure chat:  For [employer],  Adult nurse

## 2019-08-22 DIAGNOSIS — K219 Gastro-esophageal reflux disease without esophagitis: Secondary | ICD-10-CM | POA: Diagnosis not present

## 2019-08-22 DIAGNOSIS — C679 Malignant neoplasm of bladder, unspecified: Secondary | ICD-10-CM | POA: Diagnosis not present

## 2019-08-22 DIAGNOSIS — M6281 Muscle weakness (generalized): Secondary | ICD-10-CM | POA: Diagnosis not present

## 2019-08-22 DIAGNOSIS — Z8551 Personal history of malignant neoplasm of bladder: Secondary | ICD-10-CM | POA: Diagnosis not present

## 2019-08-22 DIAGNOSIS — I130 Hypertensive heart and chronic kidney disease with heart failure and stage 1 through stage 4 chronic kidney disease, or unspecified chronic kidney disease: Secondary | ICD-10-CM | POA: Diagnosis not present

## 2019-08-22 DIAGNOSIS — E43 Unspecified severe protein-calorie malnutrition: Secondary | ICD-10-CM | POA: Diagnosis not present

## 2019-08-22 DIAGNOSIS — R0689 Other abnormalities of breathing: Secondary | ICD-10-CM | POA: Diagnosis not present

## 2019-08-22 DIAGNOSIS — G629 Polyneuropathy, unspecified: Secondary | ICD-10-CM | POA: Diagnosis not present

## 2019-08-22 DIAGNOSIS — M810 Age-related osteoporosis without current pathological fracture: Secondary | ICD-10-CM | POA: Diagnosis not present

## 2019-08-22 DIAGNOSIS — N179 Acute kidney failure, unspecified: Secondary | ICD-10-CM | POA: Diagnosis not present

## 2019-08-22 DIAGNOSIS — R0989 Other specified symptoms and signs involving the circulatory and respiratory systems: Secondary | ICD-10-CM | POA: Diagnosis not present

## 2019-08-22 DIAGNOSIS — R2681 Unsteadiness on feet: Secondary | ICD-10-CM | POA: Diagnosis not present

## 2019-08-22 DIAGNOSIS — I959 Hypotension, unspecified: Secondary | ICD-10-CM | POA: Diagnosis not present

## 2019-08-22 DIAGNOSIS — I2510 Atherosclerotic heart disease of native coronary artery without angina pectoris: Secondary | ICD-10-CM | POA: Diagnosis not present

## 2019-08-22 DIAGNOSIS — R0602 Shortness of breath: Secondary | ICD-10-CM | POA: Diagnosis not present

## 2019-08-22 DIAGNOSIS — R739 Hyperglycemia, unspecified: Secondary | ICD-10-CM | POA: Diagnosis not present

## 2019-08-22 DIAGNOSIS — R0600 Dyspnea, unspecified: Secondary | ICD-10-CM | POA: Diagnosis not present

## 2019-08-22 DIAGNOSIS — G9009 Other idiopathic peripheral autonomic neuropathy: Secondary | ICD-10-CM | POA: Diagnosis not present

## 2019-08-22 DIAGNOSIS — E7800 Pure hypercholesterolemia, unspecified: Secondary | ICD-10-CM | POA: Diagnosis not present

## 2019-08-22 DIAGNOSIS — I5033 Acute on chronic diastolic (congestive) heart failure: Secondary | ICD-10-CM | POA: Diagnosis not present

## 2019-08-22 DIAGNOSIS — Z79899 Other long term (current) drug therapy: Secondary | ICD-10-CM | POA: Diagnosis not present

## 2019-08-22 DIAGNOSIS — I509 Heart failure, unspecified: Secondary | ICD-10-CM | POA: Diagnosis not present

## 2019-08-22 DIAGNOSIS — E785 Hyperlipidemia, unspecified: Secondary | ICD-10-CM | POA: Diagnosis not present

## 2019-08-22 DIAGNOSIS — J45909 Unspecified asthma, uncomplicated: Secondary | ICD-10-CM | POA: Diagnosis not present

## 2019-08-22 DIAGNOSIS — Z9889 Other specified postprocedural states: Secondary | ICD-10-CM | POA: Diagnosis not present

## 2019-08-22 DIAGNOSIS — D45 Polycythemia vera: Secondary | ICD-10-CM | POA: Diagnosis not present

## 2019-08-22 DIAGNOSIS — I10 Essential (primary) hypertension: Secondary | ICD-10-CM | POA: Diagnosis not present

## 2019-08-22 DIAGNOSIS — M2550 Pain in unspecified joint: Secondary | ICD-10-CM | POA: Diagnosis not present

## 2019-08-22 DIAGNOSIS — R634 Abnormal weight loss: Secondary | ICD-10-CM | POA: Diagnosis not present

## 2019-08-22 DIAGNOSIS — J90 Pleural effusion, not elsewhere classified: Secondary | ICD-10-CM | POA: Diagnosis not present

## 2019-08-22 DIAGNOSIS — I5032 Chronic diastolic (congestive) heart failure: Secondary | ICD-10-CM | POA: Diagnosis not present

## 2019-08-22 DIAGNOSIS — J439 Emphysema, unspecified: Secondary | ICD-10-CM | POA: Diagnosis not present

## 2019-08-22 DIAGNOSIS — R260 Ataxic gait: Secondary | ICD-10-CM | POA: Diagnosis not present

## 2019-08-22 DIAGNOSIS — Z515 Encounter for palliative care: Secondary | ICD-10-CM | POA: Diagnosis not present

## 2019-08-22 DIAGNOSIS — Z9861 Coronary angioplasty status: Secondary | ICD-10-CM | POA: Diagnosis not present

## 2019-08-22 DIAGNOSIS — I739 Peripheral vascular disease, unspecified: Secondary | ICD-10-CM | POA: Diagnosis not present

## 2019-08-22 DIAGNOSIS — N1832 Chronic kidney disease, stage 3b: Secondary | ICD-10-CM | POA: Diagnosis not present

## 2019-08-22 DIAGNOSIS — J441 Chronic obstructive pulmonary disease with (acute) exacerbation: Secondary | ICD-10-CM | POA: Diagnosis not present

## 2019-08-22 DIAGNOSIS — Z7982 Long term (current) use of aspirin: Secondary | ICD-10-CM | POA: Diagnosis not present

## 2019-08-22 DIAGNOSIS — Z85828 Personal history of other malignant neoplasm of skin: Secondary | ICD-10-CM | POA: Diagnosis not present

## 2019-08-22 DIAGNOSIS — Z20822 Contact with and (suspected) exposure to covid-19: Secondary | ICD-10-CM | POA: Diagnosis not present

## 2019-08-22 DIAGNOSIS — R911 Solitary pulmonary nodule: Secondary | ICD-10-CM | POA: Diagnosis not present

## 2019-08-22 DIAGNOSIS — Z7902 Long term (current) use of antithrombotics/antiplatelets: Secondary | ICD-10-CM | POA: Diagnosis not present

## 2019-08-22 DIAGNOSIS — J9611 Chronic respiratory failure with hypoxia: Secondary | ICD-10-CM | POA: Diagnosis not present

## 2019-08-22 DIAGNOSIS — Z86711 Personal history of pulmonary embolism: Secondary | ICD-10-CM | POA: Diagnosis not present

## 2019-08-22 DIAGNOSIS — R2689 Other abnormalities of gait and mobility: Secondary | ICD-10-CM | POA: Diagnosis not present

## 2019-08-22 DIAGNOSIS — Z66 Do not resuscitate: Secondary | ICD-10-CM | POA: Diagnosis not present

## 2019-08-22 DIAGNOSIS — E46 Unspecified protein-calorie malnutrition: Secondary | ICD-10-CM | POA: Diagnosis not present

## 2019-08-22 DIAGNOSIS — Z951 Presence of aortocoronary bypass graft: Secondary | ICD-10-CM | POA: Diagnosis not present

## 2019-08-22 DIAGNOSIS — Z7401 Bed confinement status: Secondary | ICD-10-CM | POA: Diagnosis not present

## 2019-08-22 LAB — CBC
HCT: 38.5 % (ref 36.0–46.0)
Hemoglobin: 13.1 g/dL (ref 12.0–15.0)
MCH: 30.7 pg (ref 26.0–34.0)
MCHC: 34 g/dL (ref 30.0–36.0)
MCV: 90.2 fL (ref 80.0–100.0)
Platelets: 151 10*3/uL (ref 150–400)
RBC: 4.27 MIL/uL (ref 3.87–5.11)
RDW: 13.5 % (ref 11.5–15.5)
WBC: 11.9 10*3/uL — ABNORMAL HIGH (ref 4.0–10.5)
nRBC: 0 % (ref 0.0–0.2)

## 2019-08-22 LAB — BASIC METABOLIC PANEL
Anion gap: 10 (ref 5–15)
BUN: 41 mg/dL — ABNORMAL HIGH (ref 8–23)
CO2: 32 mmol/L (ref 22–32)
Calcium: 9 mg/dL (ref 8.9–10.3)
Chloride: 91 mmol/L — ABNORMAL LOW (ref 98–111)
Creatinine, Ser: 1.01 mg/dL — ABNORMAL HIGH (ref 0.44–1.00)
GFR calc Af Amer: >60 mL/min (ref >60)
GFR calc non Af Amer: 52 mL/min — ABNORMAL LOW (ref >60)
Glucose, Bld: 110 mg/dL — ABNORMAL HIGH (ref 70–99)
Potassium: 3.5 mmol/L (ref 3.5–5.1)
Sodium: 133 mmol/L — ABNORMAL LOW (ref 135–145)

## 2019-08-22 LAB — MAGNESIUM: Magnesium: 3.1 mg/dL — ABNORMAL HIGH (ref 1.7–2.4)

## 2019-08-22 MED ORDER — LOPERAMIDE HCL 2 MG PO CAPS: 2.0000 mg | ORAL_CAPSULE | ORAL | 0 refills | Status: AC | PRN

## 2019-08-22 MED ORDER — AMLODIPINE BESYLATE 10 MG PO TABS: 10.0000 mg | ORAL_TABLET | Freq: Every day | ORAL | Status: AC

## 2019-08-22 MED ORDER — PREDNISONE 10 MG PO TABS: ORAL_TABLET | ORAL | Status: DC

## 2019-08-22 MED ORDER — LABETALOL HCL 200 MG PO TABS: 200.0000 mg | ORAL_TABLET | Freq: Two times a day (BID) | ORAL | Status: AC

## 2019-08-22 MED ORDER — ROFLUMILAST 500 MCG PO TABS: 250.0000 ug | ORAL_TABLET | Freq: Every day | ORAL | Status: DC

## 2019-08-22 MED ORDER — LOSARTAN POTASSIUM 100 MG PO TABS: 100.0000 mg | ORAL_TABLET | Freq: Every day | ORAL | Status: AC

## 2019-08-22 NOTE — TOC Transition Note (Signed)
Transition of Care [facility] - CM/SW Discharge Note   Patient Details  Name: Tina [name] MRN: [id number] Date of Birth: 02-18-1935  [facility] of [facility]) CM/SW Contact:  Shelbie Ammons, RN Phone Number: 08/22/2019, 10:28 AM   Clinical Narrative:   EMS paperwork completed, RNCM contacted [place] and spoke with [place] they are ready for patient reported to call main number for report. EMS called and bedside nurse aware.       Barriers to Discharge: Continued Medical Work up   Patient Goals and CMS Choice     Choice offered to / list presented to : NA  Discharge Placement                       Discharge Plan and Services                          HH Arranged: RN, PT [facility] Agency: [place] ([place]) Date [place]: 08/15/19   Representative spoke with at [place]: [place] ([place]) Interventions     Readmission Risk Interventions Readmission Risk Prevention Plan 08/15/2019 08/14/2019  Transportation Screening - Complete  PCP or Specialist Appt within 3-5 Days - Complete  HRI or Home Care Consult Complete (No Data)  Social Work Consult for [place] Planning/Counseling - Complete  Palliative Care Screening - Not Applicable  Medication Review [employer] photographer) - Complete  Some recent data might be hidden

## 2019-08-22 NOTE — Discharge Summary (Signed)
Physician Discharge Summary  Tina [name] FWY:[id number] DOB: 04-18-1935 DOA: 08/13/2019  PCP: Einar Pheasant, MD  Admit date: 08/13/2019 Discharge date: 08/22/2019  Admitted From: Home  Disposition:  SNF [facility]   Recommendations for Outpatient Follow-up:  1. Follow up with PCP in 1-2 weeks after discharge from SNF 2. Follow up with Dr. Lanney Gins in 2-4 weeks after discharge from SNF 3. Obtain BMP in 1 week 4. Taper prednisone: 40 mg (4 tabs) for 2 days then 30 mg (3 tabs) for 2 days then 20 mg for 2 days then 10 mg for 2 days then stop      Home Health: N/A  Equipment/Devices: Oxygen  Discharge Condition: Good  CODE STATUS: DNR Diet recommendation: Cardiac  Brief/Interim Summary: Tina [name] is a 85 y.o. F with COPD, HTN, dCHF< CAD, CKD IIIb who presented with progressive dyspnea on exertion.  Patient was recently admitted for 5 days, treated with steroids for COPD exacerbation, and was discharged continue oral prednisone.  After returning home, her symptoms worsen again so she came back to the ER.  In the ER, chest x-ray showed pleural effusion, leukocytosis.  She was started on steroids, bronchodilators, and Lasix.      PRINCIPAL HOSPITAL DIAGNOSIS: COPD exacerbation and CHF flare    Discharge Diagnoses:   COPD exacerbation Chronic hypoxic respiratory failure Acute hypoxic respiratory failure ruled out.  Patient desaturated to 88% with exertion on room air, after applying 2 L oxygen, her O2 sat returned to 93%.  Continue prednisone taper for 8 days. Follow up with Pulmonology.  Continue bronchodilators, maintenance inhalers and Roflumilast.   Acute on chronic diastolic CHF Moderate left pleural effusion Admitted and started on Lasix.  Echocardiogram last month showed EF 50 to 55% with grade 1 diastolic dysfunction.  She had worsening pleural effusion on admission.  Net negative 5.9 L on admission.  Resume torsemide at discharge.  History of pulmonary  embolism Completed 6 months of anticoagulant  CKD stage IIIb  Hypertension Coronary artery disease, secondary prevention Peripheral vascular disease with stent placement SMA artery January 2021 Continue aspirin, Plavix, pravastatin.  Continue losartan, labetalol, hydralazine, amlodipine.  Peripheral neuropathy Continue gabapentin  Protein calorie malnutrition As evidenced by chronic lung disease, poor oral intake, and severe depletion of subcutaneous muscle mass and fat, BMI 22.  Diarrhea Patient had frequent stools.  In absence of leukocytosis, renal failure, abdominal pain or fever, doubt colitis/Cdiff.          Discharge Instructions  Discharge Instructions    Diet - low sodium heart healthy   Complete by: As directed    Discharge instructions   Complete by: As directed    From Dr. Loleta Books: Start new amlodipine dose Continue hydralazine Stop metoprolol Start new labetalol, losartan  Take prednisone according to the following taper: Take 40 mg for 2 days then take 30 mg for 2 days then take 20 mg for 2 days then take 10 mg for 2 days then stop   Increase activity slowly   Complete by: As directed      Allergies as of 08/22/2019      Reactions   Evista [raloxifene] Other (See Comments)   Night sweats    Fluticasone-salmeterol Other (See Comments)   Cough, "chokes me"       Medication List    STOP taking these medications   metoprolol tartrate 100 MG tablet Commonly known as: LOPRESSOR     TAKE these medications   albuterol 108 (90 Base) MCG/ACT inhaler  Commonly known as: VENTOLIN HFA Inhale 2 puffs into the lungs every 6 (six) hours as needed for wheezing or shortness of breath.   amLODipine 10 MG tablet Commonly known as: NORVASC Take 1 tablet (10 mg total) by mouth daily. Start taking on: August 23, 2019 What changed:   medication strength  how much to take  when to take this   aspirin EC 81 MG tablet Take 1 tablet (81 mg total) by  mouth daily.   cholecalciferol 1000 units tablet Commonly known as: VITAMIN D Take 1,000 Units by mouth daily.   clopidogrel 75 MG tablet Commonly known as: PLAVIX Take 1 tablet (75 mg total) by mouth daily.   cyanocobalamin 1000 MCG tablet Take 2,000 mcg by mouth daily.   fluticasone 50 MCG/ACT nasal spray Commonly known as: FLONASE USE 2 SPRAYS INTO BOTH  NOSTRILS DAILY AS NEEDED  FOR ALLERGIES. What changed: See the new instructions.   gabapentin 100 MG capsule Commonly known as: NEURONTIN Take 2 capsules by mouth at bedtime.   hydrALAZINE 50 MG tablet Commonly known as: APRESOLINE Take 50 mg by mouth 3 (three) times daily. (take with 100mg  tablets for 150mg  total doses) What changed: Another medication with the same name was removed. Continue taking this medication, and follow the directions you see here.   ipratropium-albuterol 0.5-2.5 (3) MG/3ML Soln Commonly known as: DUONEB Take 3 mLs by nebulization every 6 (six) hours as needed (for shortness of breath).   labetalol 200 MG tablet Commonly known as: NORMODYNE Take 1 tablet (200 mg total) by mouth 2 (two) times daily.   loperamide 2 MG capsule Commonly known as: IMODIUM Take 1 capsule (2 mg total) by mouth as needed for diarrhea or loose stools.   losartan 100 MG tablet Commonly known as: COZAAR Take 1 tablet (100 mg total) by mouth daily. Start taking on: August 23, 2019   lovastatin 40 MG tablet Commonly known as: MEVACOR TAKE 1 TABLET BY MOUTH  DAILY IN THE EVENING   predniSONE 10 MG tablet Commonly known as: DELTASONE Take 40 mg for 2 days then take 30 mg for 2 days then take 20 mg for 2 days then take 10 mg for 2 days then stop   roflumilast 500 MCG Tabs tablet Commonly known as: DALIRESP Take 0.5 tablets (250 mcg total) by mouth daily. Start taking on: August 23, 2019   Symbicort 160-4.5 MCG/ACT inhaler Generic drug: budesonide-formoterol USE 2 PUFFS BY MOUTH TWO  TIMES DAILY What changed: See  the new instructions.   torsemide 20 MG tablet Commonly known as: DEMADEX Take 1 tablet (20 mg total) by mouth daily as needed (swelling or shortness of breath).       Contact information for follow-up providers    [facility] Follow up on 08/26/2019.   Specialty: Cardiology Why: at 1:30pm. Enter through the [place] entrance Contact information: [place], [place], [place]. Schedule an appointment as soon as possible for a visit in 1 week(s).   Specialty: Internal Medicine Why: after discharge from rehab Contact information: [address] [place] [address] [phone]        Ottie Glazier, MD. Schedule an appointment as soon as possible for a visit in 1 month(s).   Specialty: Pulmonary Disease Why: after discharge from rehab Contact information: [place] [address] [phone]  Contact information for after-discharge care    [facility] .   Service: Skilled Nursing Contact information: [place] [phone]                 Allergies  Allergen Reactions  . Evista [Raloxifene] Other (See Comments)    Night sweats   . Fluticasone-Salmeterol Other (See Comments)    Cough, "chokes me"     Consultations:  Pulmonology  Palliative Care   Procedures/Studies: DG Chest 2 View  Result Date: 08/13/2019 CLINICAL DATA:  Shortness of breath EXAM: CHEST - 2 VIEW COMPARISON:  08/02/2019 FINDINGS: The heart size and mediastinal contours are stable. Atherosclerotic calcification of the aortic knob. Moderate left pleural effusion, increased from prior. Hyper expanded lungs with chronically coarsened interstitial markings. No pneumothorax. Exaggerated thoracic kyphosis. IMPRESSION: 1. Moderate left  pleural effusion, increased from prior. 2. COPD. Electronically Signed   By: Davina Poke D.O.   On: 08/13/2019 13:16   DG Chest 2 View  Result Date: 08/02/2019 CLINICAL DATA:  increasing shortness of breath and weakness over last 2-3 weeks. PT states she in unable to climb her stairs at home as normal. States she has noted a drop in exertional O2 sats to low 90s. EXAM: CHEST - 2 VIEW COMPARISON:  Chest radiograph 10/20/2017 FINDINGS: The heart size and mediastinal contours are within normal limits. Aortic arch calcification. Lungs are hyperinflated chronic bilateral bronchial wall thickening. There is a small left pleural effusion and mild bibasilar opacities which could reflect scarring or atelectasis. No pneumothorax. No acute finding in the visualized skeleton. Both lungs are clear. The visualized skeletal structures are unremarkable. IMPRESSION: Small left pleural effusion and mild bibasilar opacities favored to represent scarring or atelectasis. COPD and chronic bronchitic change. Electronically Signed   By: Audie Pinto M.D.   On: 08/02/2019 13:53   US RENAL  Result Date: 08/05/2019 CLINICAL DATA:  Acute kidney injury. EXAM: RENAL / URINARY TRACT ULTRASOUND COMPLETE COMPARISON:  04/22/2019 FINDINGS: Right Kidney: Renal measurements: 9.6 x 3.6 x 4.1 cm = volume: 74.4 mL. No hydronephrosis. Echogenicity normal. There is a upper pole renal sinus cyst measuring 2 x 1.9 x 1.7 cm. Left Kidney: Renal measurements: 9.3 x 3.9 x 4.1 cm = volume: 77.0 mL. Large exophytic cyst arising from the lateral cortex of the left mid kidney measures 6.6 x 5.5 x 6.0 cm. No hydronephrosis. Normal echogenicity. Bladder: Appears normal for degree of bladder distention. Other: None. IMPRESSION: 1. No acute findings.  No hydronephrosis. 2. Bilateral kidney cysts. Electronically Signed   By: Kerby Moors M.D.   On: 08/05/2019 11:09   DG Chest Port 1 View  Result Date: 08/16/2019 CLINICAL DATA:  Dyspnea EXAM:  PORTABLE CHEST 1 VIEW COMPARISON:  08/02/2019 FINDINGS: Increased small to moderate left pleural effusion and left basilar atelectasis. Similar small right pleural effusion. Persistent interstitial prominence likely reflecting mild edema superimposed chronic changes. Similar cardiomediastinal contours. IMPRESSION: Increased small to moderate left pleural effusion with associated left basilar atelectasis. Similar small right pleural effusion. Persistent probable mild pulmonary edema superimposed on chronic interstitial prominence. Electronically Signed   By: Macy Mis M.D.   On: 08/16/2019 16:28   DG Chest Port 1 View  Result Date: 08/14/2019 CLINICAL DATA:  85 year old female status post thoracentesis EXAM: PORTABLE CHEST 1 VIEW COMPARISON:  08/13/2019 FINDINGS: Cardiomediastinal silhouette unchanged in size and contour. Evidence of prior PTCA. Mild interlobular septal thickening, seems increased from the  prior. Coarsened interstitial markings persist from the prior. Decreased opacity at the left lung base with persistent blunting at the left costophrenic angle. No pneumothorax. Minimal blunting at the right costophrenic angle. IMPRESSION: Improved left-sided pleural effusion status post thoracentesis. No pneumothorax. New trace right-sided pleural fluid. Early pulmonary edema. Electronically Signed   By: Corrie Mckusick D.O.   On: 08/14/2019 15:34   US RENAL ARTERY DUPLEX COMPLETE  Result Date: 08/19/2019 CLINICAL DATA:  85 year old female with a history of hypertension EXAM: RENAL/URINARY TRACT ULTRASOUND RENAL DUPLEX DOPPLER ULTRASOUND COMPARISON:  None. FINDINGS: Right Kidney: Length: 9.1 cm. Echogenicity within normal limits. No hydronephrosis. Focal hypoechoic region of the lateral cortex on the right kidney measures estimated 3.1 cm x 1.8 cm. Recent CT dated 04/22/2019 demonstrates no correlate, and this is a most likely artifactual Anechoic cyst on the lateral cortex, inferior pole right kidney  measures less than 2 cm, compatible with Bosniak 1 cyst. Left Kidney: Length: 9.7 cm. Echogenicity similar to the contralateral kidney. No hydronephrosis. Anechoic cyst on the left kidney cortex compatible with Bosniak 1 cyst. 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 Bilateral Bosniak 1 cysts. Signed, Dulcy Fanny. Dellia Nims, RPVI [facility] Rad[facility] Electronically Signed   By: Corrie Mckusick D.O.   On: 08/19/2019 12:13   ECHOCARDIOGRAM COMPLETE  Result Date: 08/03/2019    ECHOCARDIOGRAM REPORT   Patient Name:   JEANINE CAVEN Date of Exam: 08/03/2019 Medical Rec #:  [id number]     Height:       65.0 in Accession #:    [id number]    Weight:       135.1 lb Date of Birth:  [birth year]     BSA:          1.674 m Patient Age:    71 years      BP:           183/76 mmHg Patient Gender: F             HR:           81 bpm. Exam Location:  [facility] Procedure: 2D Echo Indications:     CHF 428.31/ I50.31  History:         Patient has prior history of Echocardiogram examinations, most                  recent 10/20/2017.  Sonographer:     Arville Go RDCS Referring Phys:  [id number] AGBATA Diagnosing Phys: Yolonda Kida MD IMPRESSIONS  1. Left ventricular ejection fraction, by estimation, is 50 to 55%. The left ventricle has low normal function. The left ventricle demonstrates global hypokinesis. Left ventricular diastolic parameters  are consistent with Grade I diastolic dysfunction (impaired relaxation).  2. Right ventricular systolic function is normal. The right ventricular size is normal.  3. The mitral valve is grossly normal. Trivial mitral valve regurgitation.  4. The aortic valve is grossly normal. Aortic valve  regurgitation is not visualized. FINDINGS  Left Ventricle: Left ventricular ejection fraction, by estimation, is 50 to 55%. The left ventricle has low normal function. The left ventricle demonstrates global hypokinesis. The left ventricular internal cavity size was normal in size. There is no left ventricular hypertrophy. Left ventricular diastolic parameters are consistent with Grade I diastolic dysfunction (impaired relaxation). Right Ventricle: The right ventricular size is normal. No increase in right ventricular wall thickness. Right ventricular systolic function is normal. Left Atrium: Left atrial size was normal in size. Right Atrium: Right atrial size was normal in size. Pericardium: There is no evidence of pericardial effusion. Mitral Valve: The mitral valve is grossly normal. Trivial mitral valve regurgitation. Tricuspid Valve: The tricuspid valve is normal in structure. Tricuspid valve regurgitation is mild. Aortic Valve: The aortic valve is grossly normal. Aortic valve regurgitation is not visualized. Aortic valve peak gradient measures 7.0 mmHg. Pulmonic Valve: The pulmonic valve was normal in structure. Pulmonic valve regurgitation is mild. Aorta: The aortic root is normal in size and structure. IAS/Shunts: The atrial septum is grossly normal.  LEFT VENTRICLE PLAX 2D LVIDd:         4.96 cm     Diastology LVIDs:         3.99 cm     LV e' lateral:   5.87 cm/s LV PW:         1.03 cm     LV E/e' lateral: 15.3 LV IVS:        0.91 cm     LV e' medial:    5.87 cm/s LVOT diam:     1.70 cm     LV E/e' medial:  15.3 LV SV:         49 LV SV Index:   29 LVOT Area:     2.27 cm  LV Volumes (MOD) LV vol d, MOD A4C: 44.2 ml LV vol  s, MOD A4C: 19.3 ml LV SV MOD A4C:     44.2 ml RIGHT VENTRICLE RV Basal diam:  1.90 cm RV S prime:     14.90 cm/s TAPSE (M-mode): 2.5 cm LEFT ATRIUM             Index       RIGHT ATRIUM           Index LA diam:        3.70 cm 2.21 cm/m  RA Area:     11.10 cm LA Vol (A2C):   31.0 ml 18.51 ml/m RA Volume:   22.20 ml  13.26 ml/m LA Vol (A4C):   14.9 ml 8.90 ml/m LA Biplane Vol: 22.9 ml 13.68 ml/m  AORTIC VALVE                PULMONIC VALVE AV Area (Vmax): 1.51 cm    PV Vmax:       1.06 m/s AV Vmax:        132.00 cm/s PV Peak grad:  4.5 mmHg AV Peak Grad:   7.0 mmHg LVOT Vmax:      87.90 cm/s LVOT Vmean:     52.800 cm/s LVOT VTI:       0.215 m  AORTA Ao Root diam: 2.60 cm MITRAL VALVE               TRICUSPID VALVE MV Area (PHT): 3.27 cm    TV Peak grad:   29.2 mmHg MV Decel Time: 232 msec    TV Vmax:        2.70  m/s MV E velocity: 89.70 cm/s MV A velocity: 96.00 cm/s  SHUNTS MV E/A ratio:  0.93        Systemic VTI:  0.22 m                            Systemic Diam: 1.70 cm Dwayne Prince [place] MD Electronically signed by Yolonda Kida MD Signature Date/Time: 08/03/2019/3:38:16 PM    Final    US THORACENTESIS ASP PLEURAL SPACE W/IMG GUIDE  Result Date: 08/14/2019 INDICATION: 85 year old female with a history of left-sided pleural effusion EXAM: ULTRASOUND GUIDED LEFT THORACENTESIS MEDICATIONS: None. COMPLICATIONS: None PROCEDURE: An ultrasound guided thoracentesis was thoroughly discussed with the patient and questions answered. The benefits, risks, alternatives and complications were also discussed. The patient understands and wishes to proceed with the procedure. Written consent was obtained. Ultrasound was performed to localize and mark an adequate pocket of fluid in the left chest. The area was then prepped and draped in the normal sterile fashion. 1% Lidocaine was used for local anesthesia. Under ultrasound guidance a 8 Fr Safe-T-Centesis catheter was introduced. Thoracentesis was performed. The catheter  was removed and a dressing applied. FINDINGS: A total of approximately 300 cc of thin yellow fluid was removed. The blood-tinged fluid is present secondary to contamination from the initial drain placement. Samples were sent to the laboratory as requested by the clinical team. IMPRESSION: Status post ultrasound-guided left-sided thoracentesis Signed, Dulcy Fanny. Dellia Nims, RPVI [facility] Radiology Electronically Signed   By: Corrie Mckusick D.O.   On: 08/14/2019 15:31       Subjective: Still has loose stools but more solid after imodium.  No fever, no abdominal pain, no cramps.  Still moderate dyspnea.  Discharge Exam: Vitals:   08/22/19 0006 08/22/19 0730  BP: (!) 121/42   Pulse: 67   Resp: 16   Temp:    SpO2: 96% 96%   Vitals:   08/21/19 1927 08/21/19 2225 08/22/19 0006 08/22/19 0730  BP:  (!) 157/56 (!) 121/42   Pulse:  81 67   Resp:  (!) 21 16   Temp:      TempSrc:      SpO2: 93% 96% 96% 96%  Weight:      Height:        General: Pt is alert, awake, not in acute distress Cardiovascular: RRR, nl S1-S2, no murmurs appreciated.   No LE edema.   Respiratory: Normal respiratory rate and rhythm. Lung sounds diminished, no rales or wheezes. Abdominal: Abdomen soft and non-tender.  No distension or HSM.   Neuro/Psych: Strength symmetric in upper and lower extremities.  Judgment and insight appear normal.   The results of significant diagnostics from this hospitalization (including imaging, microbiology, ancillary and laboratory) are listed below for reference.     Microbiology: Recent Results (from the past 240 hour(s))  SARS CORONAVIRUS 2 (TAT 6-24 HRS) Nasopharyngeal Nasopharyngeal Swab     Status: None   Collection Time: 08/13/19  2:46 PM   Specimen: Nasopharyngeal Swab  Result Value Ref Range Status   SARS Coronavirus 2 NEGATIVE NEGATIVE Final    Comment: (NOTE) SARS-CoV-2 target nucleic acids are NOT DETECTED. The  SARS-CoV-2 RNA is generally detectable in upper and lower respiratory specimens during the acute phase of infection. Negative results do not preclude SARS-CoV-2 infection, do not rule out co-infections with other pathogens, and should not be used as the sole basis for treatment or  other patient management decisions. Negative results must be combined with clinical observations, patient history, and epidemiological information. The expected result is Negative. Fact Sheet for Patients: SugarRoll.be Fact Sheet for Healthcare Providers: https://www.woods-mathews.com/ This test is not yet approved or cleared by the [place] FDA and  has been authorized for detection and/or diagnosis of SARS-CoV-2 by FDA under an Emergency Use Authorization (EUA). This EUA will remain  in effect (meaning this test can be used) for the duration of the COVID-19 declaration under Section 56 4(b)(1) of the Act, 21 U.S.C. section 360bbb-3(b)(1), unless the authorization is terminated or revoked sooner. Performed at [facility], [place] [address], [place], [place] [address]   MRSA PCR Screening     Status: None   Collection Time: 08/16/19 11:05 PM   Specimen: Nasopharyngeal  Result Value Ref Range Status   MRSA by PCR NEGATIVE NEGATIVE Final    Comment:        The GeneXpert MRSA Assay (FDA approved for NASAL specimens only), is one component of a comprehensive MRSA colonization surveillance program. It is not intended to diagnose MRSA infection nor to guide or monitor treatment for MRSA infections. Performed at [facility], [place], [place] [address]   SARS CORONAVIRUS 2 (TAT 6-24 HRS) Nasopharyngeal Nasopharyngeal Swab     Status: None   Collection Time: 08/21/19  4:34 PM   Specimen: Nasopharyngeal Swab  Result Value Ref Range Status   SARS Coronavirus 2 NEGATIVE NEGATIVE Final    Comment: (NOTE) SARS-CoV-2 target nucleic  acids are NOT DETECTED. The SARS-CoV-2 RNA is generally detectable in upper and lower respiratory specimens during the acute phase of infection. Negative results do not preclude SARS-CoV-2 infection, do not rule out co-infections with other pathogens, and should not be used as the sole basis for treatment or other patient management decisions. Negative results must be combined with clinical observations, patient history, and epidemiological information. The expected result is Negative. Fact Sheet for Patients: SugarRoll.be Fact Sheet for Healthcare Providers: https://www.woods-mathews.com/ This test is not yet approved or cleared by the [place] FDA and  has been authorized for detection and/or diagnosis of SARS-CoV-2 by FDA under an Emergency Use Authorization (EUA). This EUA will remain  in effect (meaning this test can be used) for the duration of the COVID-19 declaration under Section 56 4(b)(1) of the Act, 21 U.S.C. section 360bbb-3(b)(1), unless the authorization is terminated or revoked sooner. Performed at [facility], [place] [address], [place], [place] [address]      Labs: BNP (last 3 results) Recent Labs    08/02/19 1352 08/13/19 1949  BNP 443.0* 0,258.5*   Basic Metabolic Panel: Recent Labs  Lab 08/18/19 0452 08/19/19 0556 08/20/19 0413 08/21/19 0432 08/22/19 0538  NA 140 138 137 137 133*  K 3.2* 3.2* 3.2* 3.2* 3.5  CL 96* 93* 93* 91* 91*  CO2 33* 33* 33* 36* 32  GLUCOSE 161* 140* 112* 124* 110*  BUN 35* 33* 27* 33* 41*  CREATININE 0.96 0.89 0.82 0.89 1.01*  CALCIUM 8.8* 8.5* 8.6* 8.8* 9.0  MG 2.5* 2.7* 2.4 2.5* 3.1*  PHOS  --  2.7  --   --   --    Liver Function Tests: No results for input(s): AST, ALT, ALKPHOS, BILITOT, PROT, ALBUMIN in the last 168 hours. No results for input(s): LIPASE, AMYLASE in the last 168 hours. No results for input(s): AMMONIA in the last 168 hours. CBC: Recent Labs  Lab  08/18/19  5701 08/19/19 0556 08/20/19 0413 08/21/19 0432 08/22/19 0538  WBC 16.0* 15.6* 13.3* 12.4* 11.9*  HGB 12.2 12.5 12.8 12.3 13.1  HCT 35.0* 37.1 37.9 36.7 38.5  MCV 89.3 90.0 90.5 90.0 90.2  PLT 161 153 152 153 151   Cardiac Enzymes: No results for input(s): CKTOTAL, CKMB, CKMBINDEX, TROPONINI in the last 168 hours. BNP: Invalid input(s): POCBNP CBG: Recent Labs  Lab 08/16/19 2310  GLUCAP 205*   D-Dimer No results for input(s): DDIMER in the last 72 hours. Hgb A1c No results for input(s): HGBA1C in the last 72 hours. Lipid Profile No results for input(s): CHOL, HDL, LDLCALC, TRIG, CHOLHDL, LDLDIRECT in the last 72 hours. Thyroid function studies No results for input(s): TSH, T4TOTAL, T3FREE, THYROIDAB in the last 72 hours.  Invalid input(s): FREET3 Anemia work up No results for input(s): VITAMINB12, FOLATE, FERRITIN, TIBC, IRON, RETICCTPCT in the last 72 hours. Urinalysis    Component Value Date/Time   COLORURINE YELLOW (A) 08/05/2019 1257   APPEARANCEUR CLEAR (A) 08/05/2019 1257   APPEARANCEUR Clear 09/13/2017 1502   LABSPEC 1.016 08/05/2019 1257   PHURINE 5.0 08/05/2019 1257   GLUCOSEU NEGATIVE 08/05/2019 1257   GLUCOSEU NEGATIVE 10/09/2017 1121   HGBUR NEGATIVE 08/05/2019 1257   BILIRUBINUR NEGATIVE 08/05/2019 1257   BILIRUBINUR Negative 09/13/2017 1502   KETONESUR NEGATIVE 08/05/2019 1257   PROTEINUR NEGATIVE 08/05/2019 1257   UROBILINOGEN 0.2 10/09/2017 1121   NITRITE NEGATIVE 08/05/2019 1257   LEUKOCYTESUR NEGATIVE 08/05/2019 1257   Sepsis Labs Invalid input(s): PROCALCITONIN,  WBC,  LACTICIDVEN Microbiology Recent Results (from the past 240 hour(s))  SARS CORONAVIRUS 2 (TAT 6-24 HRS) Nasopharyngeal Nasopharyngeal Swab     Status: None   Collection Time: 08/13/19  2:46 PM   Specimen: Nasopharyngeal Swab  Result Value Ref Range Status   SARS Coronavirus 2 NEGATIVE NEGATIVE Final    Comment: (NOTE) SARS-CoV-2 target nucleic acids are NOT  DETECTED. The SARS-CoV-2 RNA is generally detectable in upper and lower respiratory specimens during the acute phase of infection. Negative results do not preclude SARS-CoV-2 infection, do not rule out co-infections with other pathogens, and should not be used as the sole basis for treatment or other patient management decisions. Negative results must be combined with clinical observations, patient history, and epidemiological information. The expected result is Negative. Fact Sheet for Patients: SugarRoll.be Fact Sheet for Healthcare Providers: https://www.woods-mathews.com/ This test is not yet approved or cleared by the [place] FDA and  has been authorized for detection and/or diagnosis of SARS-CoV-2 by FDA under an Emergency Use Authorization (EUA). This EUA will remain  in effect (meaning this test can be used) for the duration of the COVID-19 declaration under Section 56 4(b)(1) of the Act, 21 U.S.C. section 360bbb-3(b)(1), unless the authorization is terminated or revoked sooner. Performed at [facility], [place] [address] [place], [place] [address]   MRSA PCR Screening     Status: None   Collection Time: 08/16/19 11:05 PM   Specimen: Nasopharyngeal  Result Value Ref Range Status   MRSA by PCR NEGATIVE NEGATIVE Final    Comment:        The GeneXpert MRSA Assay (FDA approved for NASAL specimens only), is one component of a comprehensive MRSA colonization surveillance program. It is not intended to diagnose MRSA infection nor to guide or monitor treatment for MRSA infections. Performed at [facility], [place], [place] [address]   SARS CORONAVIRUS 2 (TAT 6-24 HRS) Nasopharyngeal Nasopharyngeal Swab  Status: None   Collection Time: 08/21/19  4:34 PM   Specimen: Nasopharyngeal Swab  Result Value Ref Range Status   SARS Coronavirus 2 NEGATIVE NEGATIVE Final    Comment: (NOTE) SARS-CoV-2  target nucleic acids are NOT DETECTED. The SARS-CoV-2 RNA is generally detectable in upper and lower respiratory specimens during the acute phase of infection. Negative results do not preclude SARS-CoV-2 infection, do not rule out co-infections with other pathogens, and should not be used as the sole basis for treatment or other patient management decisions. Negative results must be combined with clinical observations, patient history, and epidemiological information. The expected result is Negative. Fact Sheet for Patients: SugarRoll.be Fact Sheet for Healthcare Providers: https://www.woods-mathews.com/ This test is not yet approved or cleared by the [place] FDA and  has been authorized for detection and/or diagnosis of SARS-CoV-2 by FDA under an Emergency Use Authorization (EUA). This EUA will remain  in effect (meaning this test can be used) for the duration of the COVID-19 declaration under Section 56 4(b)(1) of the Act, 21 U.S.C. section 360bbb-3(b)(1), unless the authorization is terminated or revoked sooner. Performed at [facility], [place] [address] [place], [place] [address]      Time coordinating discharge: 25 minutes The [place] controlled substances registry was reviewed for this patient       SIGNED:   Edwin Dada, MD  [facility] 08/22/2019, 9:56 AM

## 2019-08-23 LAB — CALCIUM, IONIZED: Calcium, Ionized, Serum: 5.1 mg/dL (ref 4.5–5.6)

## 2019-08-25 ENCOUNTER — Telehealth: Payer: Self-pay | Admitting: Family

## 2019-08-25 DIAGNOSIS — E785 Hyperlipidemia, unspecified: Secondary | ICD-10-CM | POA: Diagnosis not present

## 2019-08-25 DIAGNOSIS — R0600 Dyspnea, unspecified: Secondary | ICD-10-CM | POA: Diagnosis not present

## 2019-08-25 DIAGNOSIS — J9611 Chronic respiratory failure with hypoxia: Secondary | ICD-10-CM | POA: Diagnosis not present

## 2019-08-25 DIAGNOSIS — R0602 Shortness of breath: Secondary | ICD-10-CM | POA: Diagnosis not present

## 2019-08-25 NOTE — Progress Notes (Deleted)
Patient ID: LONA SIX, female    DOB: [birth year], 85 y.o.   MRN: [id number]  HPI  Ms Pajak is a 85 y/o female with a history of asthma, bladder cancer, CAD, hyperlipidemia, HTN, CKD, COPD, GERD, anemia, previous tobacco use and chronic heart failure.   Echo report from 08/03/19 reviewed and showed an EF of 50-55% with trivial MR. Echo report from 10/20/17 reviewed and showed an EF of 60-65% along with mild MR.   Admitted 08/13/19 due to COPD exacerbation along with acute on chronic HF. Pulmonology and palliative care consults obtained. Placed on oxygen and prednisone taper. Initially needed IV lasix and then transitioned to oral diuretics. Net loss of 5.9L.     Discharged after 9 days. Admitted 08/02/19 due to COPD exacerbation. Nephrology consult obtained. Initially needed oxygen but was able to be weaned off of it. Given prednisone and nebulizer treatment. Given IV lasix but then held due to worsening renal function. Discharged after 4 days.   She presents today for a follow-up visit although hasn't been seen since November 2019. She presents with a chief complaint of  Past Medical History:  Diagnosis Date  . Anemia   . Asthma   . Bladder cancer ([place])   . BRCA positive   . CAD (coronary artery disease)   . CHF (congestive heart failure) ([place])   . COPD (chronic obstructive pulmonary disease) ([place])   . Emphysema lung ([place])   . GERD (gastroesophageal reflux disease)   . History of colon polyps   . Hypercholesterolemia   . Hyperglycemia   . Hypertension   . Lung nodules   . Osteoporosis   . Personal history of tobacco use, presenting hazards to health 11/25/2014  . Polycythemia vera(238.4)   . Renal cyst   . Skin cancer    Past Surgical History:  Procedure Laterality Date  . ANGIOPLASTY     coronary (x1)  . COLONOSCOPY WITH PROPOFOL N/A 03/02/2015   Procedure: COLONOSCOPY WITH PROPOFOL;  Surgeon: Lollie Sails, MD;  Location: [facility] ENDOSCOPY;  Service: Endoscopy;  Laterality:  N/A;  . COLONOSCOPY WITH PROPOFOL N/A 02/13/2017   Procedure: COLONOSCOPY WITH PROPOFOL;  Surgeon: [name], MD;  Location: [facility] ENDOSCOPY;  Service: Endoscopy;  Laterality: N/A;  . CORONARY ANGIOPLASTY    . CORONARY ARTERY BYPASS GRAFT  09/17/2017   pt denies  . CYSTOSCOPY W/ RETROGRADES Bilateral 02/13/2017   Procedure: CYSTOSCOPY WITH RETROGRADE PYELOGRAM;  Surgeon: Hollice Espy, MD;  Location: [facility] ORS;  Service: Urology;  Laterality: Bilateral;  . CYSTOSCOPY W/ RETROGRADES Bilateral 09/19/2017   Procedure: CYSTOSCOPY WITH RETROGRADE PYELOGRAM;  Surgeon: Hollice Espy, MD;  Location: [facility] ORS;  Service: Urology;  Laterality: Bilateral;  . CYSTOSCOPY WITH BIOPSY  03/26/2017   Procedure: CYSTOSCOPY WITH BIOPSY;  Surgeon: Hollice Espy, MD;  Location: [facility] ORS;  Service: Urology;;  . Consuela Mimes WITH STENT PLACEMENT Bilateral 02/13/2017   Procedure: CYSTOSCOPY WITH STENT PLACEMENT;  Surgeon: Hollice Espy, MD;  Location: [facility] ORS;  Service: Urology;  Laterality: Bilateral;  . CYSTOSCOPY WITH STENT PLACEMENT Bilateral 03/26/2017   Procedure: CYSTOSCOPY WITH STENT EXCHANGE;  Surgeon: Hollice Espy, MD;  Location: [facility] ORS;  Service: Urology;  Laterality: Bilateral;  . ESOPHAGOGASTRODUODENOSCOPY (EGD) WITH PROPOFOL N/A 02/13/2017   Procedure: ESOPHAGOGASTRODUODENOSCOPY (EGD) WITH PROPOFOL;  Surgeon: [name], MD;  Location: [facility] ENDOSCOPY;  Service: Endoscopy;  Laterality: N/A;  . PORTA CATH INSERTION N/A 02/28/2017   Procedure: PORTA CATH INSERTION;  Surgeon: Algernon Huxley, MD;  Location: [place]  CV LAB;  Service: Cardiovascular;  Laterality: N/A;  . PORTA CATH REMOVAL N/A 12/24/2017   Procedure: PORTA CATH REMOVAL;  Surgeon: Algernon Huxley, MD;  Location: [place] CV LAB;  Service: Cardiovascular;  Laterality: N/A;  . SALPINGOOPHORECTOMY    . TONSILECTOMY/ADENOIDECTOMY WITH MYRINGOTOMY    . TONSILLECTOMY    . TRANSURETHRAL RESECTION OF BLADDER TUMOR N/A 02/13/2017    Procedure: TRANSURETHRAL RESECTION OF BLADDER TUMOR (TURBT);  Surgeon: Hollice Espy, MD;  Location: [facility] ORS;  Service: Urology;  Laterality: N/A;  . TUBAL LIGATION    . UPPER GI ENDOSCOPY  02/13/2017  . URETEROSCOPY Left 09/19/2017   Procedure: URETEROSCOPY;  Surgeon: Hollice Espy, MD;  Location: [facility] ORS;  Service: Urology;  Laterality: Left;  Marland Kitchen VISCERAL ANGIOGRAPHY N/A 01/23/2019   Procedure: VISCERAL ANGIOGRAPHY;  Surgeon: Algernon Huxley, MD;  Location: [place] CV LAB;  Service: Cardiovascular;  Laterality: N/A;  . VISCERAL ANGIOGRAPHY N/A 06/09/2019   Procedure: VISCERAL ANGIOGRAPHY;  Surgeon: Algernon Huxley, MD;  Location: [place] CV LAB;  Service: Cardiovascular;  Laterality: N/A;  . VISCERAL ARTERY INTERVENTION N/A 02/12/2017   Procedure: VISCERAL ARTERY INTERVENTION;  Surgeon: Algernon Huxley, MD;  Location: [place] CV LAB;  Service: Cardiovascular;  Laterality: N/A;   Family History  Problem Relation Age of Onset  . Cirrhosis Father        died age 72  . Alcohol abuse Father   . Asthma Mother   . Congestive Heart Failure Mother   . Breast cancer Mother        2 (1/2 sisters)  . Osteoarthritis Mother   . Colon cancer Mother   . Lupus Sister   . Alcohol abuse Sister   . Ovarian cancer Sister   . Osteoporosis Sister   . Skin cancer Sister   . Breast cancer Cousin   . Breast cancer Maternal Aunt    Social History   Tobacco Use  . Smoking status: Former Smoker    Packs/day: 1.00    Years: 45.00    Pack years: 45.00    Types: Cigarettes    Quit date: 11/11/2003    Years since quitting: 15.7  . Smokeless tobacco: Never Used  Substance Use Topics  . Alcohol use: Not Currently    Alcohol/week: 0.0 standard drinks   Allergies  Allergen Reactions  . Evista [Raloxifene] Other (See Comments)    Night sweats   . Fluticasone-Salmeterol Other (See Comments)    Cough, "chokes me"      Review of Systems  Constitutional: Positive for fatigue (with moderate  exertion). Negative for appetite change.  HENT: Negative for congestion, rhinorrhea and sore throat.   Eyes: Negative.   Respiratory: Positive for shortness of breath (with minimal exertion). Negative for cough and chest tightness.   Cardiovascular: Positive for palpitations (at times) and leg swelling ("better"). Negative for chest pain.  Gastrointestinal: Negative for abdominal distention and abdominal pain.  Endocrine: Negative.   Genitourinary: Negative.   Musculoskeletal: Negative for back pain and neck pain.  Skin: Negative.   Allergic/Immunologic: Negative.   Neurological: Positive for light-headedness ("wobbly"). Negative for dizziness.  Hematological: Negative for adenopathy. Bruises/bleeds easily.  Psychiatric/Behavioral: Negative for dysphoric mood and sleep disturbance (sleeping on 1 pillow). The patient is not nervous/anxious.      Physical Exam Vitals and nursing note reviewed.  Constitutional:      Appearance: She is well-developed.  HENT:     Head: Normocephalic and atraumatic.  Neck:  Vascular: No JVD.  Cardiovascular:     Rate and Rhythm: Normal rate and regular rhythm.  Pulmonary:     Effort: Pulmonary effort is normal. No respiratory distress.     Breath sounds: No wheezing or rales.  Abdominal:     General: There is no distension.     Palpations: Abdomen is soft.  Musculoskeletal:        General: No tenderness.     Cervical back: Normal range of motion and neck supple.  Skin:    General: Skin is warm and dry.     Findings: Bruising (both arms) present.  Neurological:     Mental Status: She is alert and oriented to person, place, and time.  Psychiatric:        Behavior: Behavior normal.        Thought Content: Thought content normal.    Assessment & Plan:  1: Chronic heart failure with preserved ejection fraction- - NYHA class III - euvolemic today with pedal edema - weighing daily; Reminded to call for an overnight weight gain of >2 pounds or  a weekly weight gain of >5 pounds - not adding salt and has been reading food labels occasionally. Reminded to closely follow a 2090m sodium diet - saw cardiology (Margarito Courser 06/19/19 - wearing support pantyhose and says that after she elevates her legs, the edema goes down. No swelling when she first wakes up in the mornings - encouraged her to get compression socks and wear them daily with removal at bedtime - is quite active and line dances twice weekly - BNP 10/20/17 was 517.0   2: HTN- - BP  - saw PCP (Nicki Reaper 06/26/19 - BMP on 12/10/17 reviewed and showed sodium 140, potassium 3.8 and GFR 48 - sees nephrology (Candiss Norse   3: COPD- - saw pulmonology (Raul Del 04/03/2019    Patient did not bring her medications nor a list. Each medication was verbally reviewed with the patient and she was encouraged to bring the bottles to every visit to confirm accuracy of list.

## 2019-08-25 NOTE — Telephone Encounter (Signed)
LVM with patient reminding her of the follow up [facility] appointment we have scheduled for 4/13 and to see how shes doing since she left hospital.   Alyse Low, NT

## 2019-08-26 ENCOUNTER — Ambulatory Visit: Payer: Medicare Other | Admitting: Family

## 2019-08-26 ENCOUNTER — Telehealth: Payer: Self-pay | Admitting: Family

## 2019-08-26 NOTE — Telephone Encounter (Signed)
Patient did not show for her [facility] appointment on 08/26/19. Will attempt to reschedule.  

## 2019-08-27 ENCOUNTER — Non-Acute Institutional Stay: Payer: Medicare Other | Admitting: Nurse Practitioner

## 2019-08-27 ENCOUNTER — Encounter: Payer: Self-pay | Admitting: Nurse Practitioner

## 2019-08-27 ENCOUNTER — Other Ambulatory Visit: Payer: Self-pay

## 2019-08-27 VITALS — BP 106/61 | HR 75 | Temp 97.6°F | Resp 22 | Wt 122.0 lb

## 2019-08-27 DIAGNOSIS — Z515 Encounter for palliative care: Secondary | ICD-10-CM | POA: Diagnosis not present

## 2019-08-27 DIAGNOSIS — I509 Heart failure, unspecified: Secondary | ICD-10-CM | POA: Diagnosis not present

## 2019-08-27 NOTE — Progress Notes (Signed)
[facility] Palliative Care Consult Note Telephone: [phone]  Fax: [phone]  PATIENT NAME: Tina [name] DOB: [birth year] MRN: [id number]  PRIMARY CARE PROVIDER:   Einar Pheasant, MD  REFERRING PROVIDER:  Dr [facility] RESPONSIBLE PARTY:   Self/ Othelia Pulling Son [phone]  I was asked by Dr Nyra Capes to see Ms. Heikkila for Palliative care consult for goals of care  RECOMMENDATIONS and PLAN:  1. ACP: DNR; placed in [place]   2. Palliative care encounter; Palliative medicine team will continue to support patient, patient's family, and medical team. Visit consisted of counseling and education dealing with the complex and emotionally intense issues of symptom management and palliative care in the setting of serious and potentially life-threatening illness  I spent 65 minutes providing this consultation,  From 1:15pm to 2:20pm. More than 50% of the time in this consultation was spent coordinating communication.   HISTORY OF PRESENT ILLNESS:  Tina [name] is a 85 y.o. 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 Hospitalized 12 / 26 / 2020 to 12 / 29 / 2020 for 19 days of diarrhea following ingestion for CT contrast still negative for C difficile. Received IV fluids with diarrhea, hyperkalemia, Aki resolved. Ideologies of diarrhea not established and she was discharged home. Hospitalized 3 / 20 / 2021 to  3 / 24 / 2021 for shortness of breath, lower extremity weakness for three weeks with shortness of breath. Work up significant for acute hypoxic respiratory failure which resolve secondary to COPD. She was weaned off her oxygen. Chronic diastolic congestive heart failure with last Echo lvef 60 to 65%/TEE completed this hospitalization with lvef 50 to 55% with grade 1 diastolic dysfunction. Received IV Lasix. Acute kidney injury with progression of chronic kidney disease. She was discharged home. Hospitalized 3 / 31 / 2021 to 4 / 9 / 2021 for Progressive dyspnea on exertion with work up revealing pleural effusions, leukocytosis. COPD with chronic respiratory failure acute hypoxia ruled out continued on oxygen upon discharge, prednisone taper and pulmonology follow up with inhalation therapy. Acute on chronic diastolic congestive heart failure started on Lasix with worsening pleural effusions. Coronary artery disease secondary prevention peripheral vascular disease with stent placement SMA artery 05/2019. Protein calorie malnutrition evidence by chronic lung disease poor oral intake and depletion of subcutaneous muscle mass with BMI 22. Seen by palliative care during this hospitalization. Was agreeable to trying morphine for dyspnea. Per documentation she is independent with ADLs and iadls that she is able to drive get groceries and manage her household bills. She lives with her adult son at home. For documentation Ms. Deats names her daughter Atlee Abide as her health care surrogate and she has one living sister who is supportive. Wishes are for no CPR or intubation but full scope treatment otherwise. Palliative care to continue to follow during short-term rehab where she was discharged to [facility] what she currently resides. Ms. Lines does transfer to the wheelchair. Ms. Iyer has been ambulating with a walker, assistance with therapy. Ms. Oxendine does require assistance with ADLs, toileting. Ms. Meany does  feed herself and appetite has been declined. Ms. Vukelich does remain on 2 liters of oxygen. At present Ms. Salatino is sitting in bed. Ms. Duell just completed therapy and is mildly short of breath. We talked about how she was feeling today. Ms. Shepperson endorses she is improving very slowly. We talked about purpose of palliative care visit. Ms. Pecha in agreement. We talked about symptoms of pain but she denies. We talked about shortness of breath, continuous oxygen. We talked about her appetite which has been improving somewhat. We talked about the work that she has been doing with therapy. Praised Ms. Tarpley for her hard work. Ms. Perkins was cooperative with assessment. We talked about medical goals of care. Ms. Hoel is a do not resuscitate. We talked about recent hospitalization, chronic disease. Will wait to see how Ms Tullos does with therapy and ongoing discussions with discharge planning. Emotional support provided. We talked about role of palliative care and plan of care. Discuss will follow up in 2 days with palliative follow-up visit. I have attempted to contact her daughter for update on palliative care visit. I updated nursing staff many changes at present time. will put DNR in [place]. .  Palliative Care was asked to help address goals of care.   CODE STATUS: DNR  PPS: 40% HOSPICE ELIGIBILITY/DIAGNOSIS: TBD  PAST MEDICAL HISTORY:  Past Medical History:  Diagnosis Date  . Anemia   . Asthma   . Bladder cancer ([place])   . BRCA positive   . CAD (coronary artery disease)   . CHF (congestive heart failure) ([place])   . COPD (chronic obstructive pulmonary disease) ([place])   . Emphysema lung ([place])   . GERD (gastroesophageal reflux disease)   . History of colon polyps   . Hypercholesterolemia   . Hyperglycemia   . Hypertension   . Lung nodules   . Osteoporosis   . Personal history of tobacco use, presenting hazards to health 11/25/2014  . Polycythemia vera(238.4)   . Renal cyst   . Skin cancer       SOCIAL HX:  Social History   Tobacco Use  . Smoking status: Former Smoker    Packs/day: 1.00    Years: 45.00    Pack years: 45.00    Types: Cigarettes    Quit date: 11/11/2003    Years since quitting: 15.8  . Smokeless tobacco: Never Used  Substance Use Topics  . Alcohol use: Not Currently    Alcohol/week: 0.0 standard drinks    ALLERGIES:  Allergies  Allergen Reactions  . Evista [Raloxifene] Other (See Comments)    Night sweats   . Fluticasone-Salmeterol Other (See Comments)    Cough, "chokes me"      PERTINENT MEDICATIONS:  Outpatient Encounter Medications as of 08/27/2019  Medication Sig  . albuterol (PROVENTIL HFA;VENTOLIN HFA) 108 (90 Base) MCG/ACT inhaler Inhale 2 puffs into the lungs every 6 (six) hours as needed for wheezing or shortness of breath.  Marland Kitchen amLODipine (NORVASC) 10 MG tablet Take 1 tablet (10 mg total) by mouth daily.  Marland Kitchen aspirin EC 81 MG tablet Take 1 tablet (81 mg total) by mouth daily.  . cholecalciferol (VITAMIN D) 1000 units tablet Take 1,000 Units by mouth daily.  . clopidogrel (PLAVIX) 75 MG tablet Take 1 tablet (75 mg total) by mouth daily.  . cyanocobalamin 1000 MCG tablet Take 2,000 mcg by mouth daily.  . fluticasone (FLONASE) 50 MCG/ACT nasal spray USE 2 SPRAYS INTO BOTH  NOSTRILS DAILY AS NEEDED  FOR  ALLERGIES. (Patient taking differently: Place 2 sprays into both nostrils daily. )  . gabapentin (NEURONTIN) 100 MG capsule Take 2 capsules by mouth at bedtime.  . hydrALAZINE (APRESOLINE) 50 MG tablet Take 50 mg by mouth 3 (three) times daily. (take with 156m tablets for 1516mtotal doses)  . ipratropium-albuterol (DUONEB) 0.5-2.5 (3) MG/3ML SOLN Take 3 mLs by nebulization every 6 (six) hours as needed (for shortness of breath).  . labetalol (NORMODYNE) 200 MG tablet Take 1 tablet (200 mg total) by mouth 2 (two) times daily.  . Marland Kitchenoperamide (IMODIUM) 2 MG capsule Take 1 capsule (2 mg total) by mouth as needed for diarrhea or loose stools.  . Marland Kitchenosartan  (COZAAR) 100 MG tablet Take 1 tablet (100 mg total) by mouth daily.  . Marland Kitchenovastatin (MEVACOR) 40 MG tablet TAKE 1 TABLET BY MOUTH  DAILY IN THE EVENING (Patient taking differently: Take 40 mg by mouth every evening. )  . predniSONE (DELTASONE) 10 MG tablet Take 40 mg for 2 days then take 30 mg for 2 days then take 20 mg for 2 days then take 10 mg for 2 days then stop  . roflumilast (DALIRESP) 500 MCG TABS tablet Take 0.5 tablets (250 mcg total) by mouth daily.  . SYMBICORT 160-4.5 MCG/ACT inhaler USE 2 PUFFS BY MOUTH TWO  TIMES DAILY (Patient taking differently: Inhale 2 puffs into the lungs in the morning and at bedtime. )  . torsemide (DEMADEX) 20 MG tablet Take 1 tablet (20 mg total) by mouth daily as needed (swelling or shortness of breath).   Facility-Administered Encounter Medications as of 08/27/2019  Medication  . heparin lock flush 100 unit/mL  . sodium chloride flush (NS) 0.9 % injection 10 mL    PHYSICAL EXAM:   General: chronically ill, frail appearing, thin, pleasant female Cardiovascular: regular rate and rhythm Pulmonary: clear ant fields Extremities: no edema, no joint deformities Neurological: generalized weakness, steps with walker  Tylin Stradley Z Ihor GullyNP

## 2019-08-28 ENCOUNTER — Emergency Department: Payer: Medicare Other

## 2019-08-28 ENCOUNTER — Observation Stay
Admission: EM | Admit: 2019-08-28 | Discharge: 2019-08-29 | Disposition: A | Discharge status: Home / Self Care | Payer: Medicare Other | Attending: Internal Medicine | Admitting: Internal Medicine

## 2019-08-28 ENCOUNTER — Other Ambulatory Visit: Payer: Self-pay

## 2019-08-28 DIAGNOSIS — Z7951 Long term (current) use of inhaled steroids: Secondary | ICD-10-CM | POA: Insufficient documentation

## 2019-08-28 DIAGNOSIS — Z8551 Personal history of malignant neoplasm of bladder: Secondary | ICD-10-CM | POA: Diagnosis not present

## 2019-08-28 DIAGNOSIS — E7800 Pure hypercholesterolemia, unspecified: Secondary | ICD-10-CM | POA: Diagnosis not present

## 2019-08-28 DIAGNOSIS — Z85828 Personal history of other malignant neoplasm of skin: Secondary | ICD-10-CM | POA: Diagnosis not present

## 2019-08-28 DIAGNOSIS — Z79899 Other long term (current) drug therapy: Secondary | ICD-10-CM | POA: Insufficient documentation

## 2019-08-28 DIAGNOSIS — E43 Unspecified severe protein-calorie malnutrition: Secondary | ICD-10-CM | POA: Insufficient documentation

## 2019-08-28 DIAGNOSIS — Z7902 Long term (current) use of antithrombotics/antiplatelets: Secondary | ICD-10-CM | POA: Insufficient documentation

## 2019-08-28 DIAGNOSIS — I959 Hypotension, unspecified: Secondary | ICD-10-CM | POA: Insufficient documentation

## 2019-08-28 DIAGNOSIS — Z86711 Personal history of pulmonary embolism: Secondary | ICD-10-CM | POA: Insufficient documentation

## 2019-08-28 DIAGNOSIS — N1832 Chronic kidney disease, stage 3b: Secondary | ICD-10-CM | POA: Insufficient documentation

## 2019-08-28 DIAGNOSIS — Z20822 Contact with and (suspected) exposure to covid-19: Secondary | ICD-10-CM | POA: Insufficient documentation

## 2019-08-28 DIAGNOSIS — I5032 Chronic diastolic (congestive) heart failure: Secondary | ICD-10-CM | POA: Diagnosis not present

## 2019-08-28 DIAGNOSIS — Z87891 Personal history of nicotine dependence: Secondary | ICD-10-CM | POA: Insufficient documentation

## 2019-08-28 DIAGNOSIS — J45909 Unspecified asthma, uncomplicated: Secondary | ICD-10-CM | POA: Insufficient documentation

## 2019-08-28 DIAGNOSIS — R0602 Shortness of breath: Secondary | ICD-10-CM

## 2019-08-28 DIAGNOSIS — Z9861 Coronary angioplasty status: Secondary | ICD-10-CM | POA: Diagnosis not present

## 2019-08-28 DIAGNOSIS — I739 Peripheral vascular disease, unspecified: Secondary | ICD-10-CM | POA: Diagnosis not present

## 2019-08-28 DIAGNOSIS — D45 Polycythemia vera: Secondary | ICD-10-CM | POA: Diagnosis not present

## 2019-08-28 DIAGNOSIS — Z951 Presence of aortocoronary bypass graft: Secondary | ICD-10-CM | POA: Insufficient documentation

## 2019-08-28 DIAGNOSIS — G629 Polyneuropathy, unspecified: Secondary | ICD-10-CM | POA: Diagnosis not present

## 2019-08-28 DIAGNOSIS — Z7982 Long term (current) use of aspirin: Secondary | ICD-10-CM | POA: Diagnosis not present

## 2019-08-28 DIAGNOSIS — I2510 Atherosclerotic heart disease of native coronary artery without angina pectoris: Secondary | ICD-10-CM | POA: Diagnosis not present

## 2019-08-28 DIAGNOSIS — J439 Emphysema, unspecified: Secondary | ICD-10-CM | POA: Diagnosis not present

## 2019-08-28 DIAGNOSIS — I130 Hypertensive heart and chronic kidney disease with heart failure and stage 1 through stage 4 chronic kidney disease, or unspecified chronic kidney disease: Secondary | ICD-10-CM | POA: Diagnosis not present

## 2019-08-28 DIAGNOSIS — Z66 Do not resuscitate: Secondary | ICD-10-CM | POA: Diagnosis not present

## 2019-08-28 DIAGNOSIS — N179 Acute kidney failure, unspecified: Principal | ICD-10-CM | POA: Diagnosis present

## 2019-08-28 DIAGNOSIS — I5033 Acute on chronic diastolic (congestive) heart failure: Secondary | ICD-10-CM | POA: Diagnosis not present

## 2019-08-28 DIAGNOSIS — R634 Abnormal weight loss: Secondary | ICD-10-CM | POA: Diagnosis not present

## 2019-08-28 LAB — COMPREHENSIVE METABOLIC PANEL
ALT: 15 U/L (ref 0–44)
AST: 13 U/L — ABNORMAL LOW (ref 15–41)
Albumin: 3.5 g/dL (ref 3.5–5.0)
Alkaline Phosphatase: 56 U/L (ref 38–126)
Anion gap: 11 (ref 5–15)
BUN: 42 mg/dL — ABNORMAL HIGH (ref 8–23)
CO2: 27 mmol/L (ref 22–32)
Calcium: 9.2 mg/dL (ref 8.9–10.3)
Chloride: 98 mmol/L (ref 98–111)
Creatinine, Ser: 1.81 mg/dL — ABNORMAL HIGH (ref 0.44–1.00)
GFR calc Af Amer: 30 mL/min — ABNORMAL LOW (ref >60)
GFR calc non Af Amer: 26 mL/min — ABNORMAL LOW (ref >60)
Glucose, Bld: 139 mg/dL — ABNORMAL HIGH (ref 70–99)
Potassium: 4.7 mmol/L (ref 3.5–5.1)
Sodium: 136 mmol/L (ref 135–145)
Total Bilirubin: 1.3 mg/dL — ABNORMAL HIGH (ref 0.3–1.2)
Total Protein: 6.1 g/dL — ABNORMAL LOW (ref 6.5–8.1)

## 2019-08-28 LAB — TROPONIN I (HIGH SENSITIVITY)
Troponin I (High Sensitivity): 27 ng/L — ABNORMAL HIGH (ref <18)
Troponin I (High Sensitivity): 15 ng/L (ref <18)

## 2019-08-28 LAB — CBC
HCT: 38.8 % (ref 36.0–46.0)
Hemoglobin: 12.7 g/dL (ref 12.0–15.0)
MCH: 30.8 pg (ref 26.0–34.0)
MCHC: 32.7 g/dL (ref 30.0–36.0)
MCV: 94.2 fL (ref 80.0–100.0)
Platelets: 199 10*3/uL (ref 150–400)
RBC: 4.12 MIL/uL (ref 3.87–5.11)
RDW: 14 % (ref 11.5–15.5)
WBC: 12.2 10*3/uL — ABNORMAL HIGH (ref 4.0–10.5)
nRBC: 0 % (ref 0.0–0.2)

## 2019-08-28 LAB — BRAIN NATRIURETIC PEPTIDE: B Natriuretic Peptide: 242 pg/mL — ABNORMAL HIGH (ref 0.0–100.0)

## 2019-08-28 LAB — TSH: TSH: 1.531 u[IU]/mL (ref 0.350–4.500)

## 2019-08-28 LAB — SARS CORONAVIRUS 2 (TAT 6-24 HRS): SARS Coronavirus 2: NEGATIVE

## 2019-08-28 MED ORDER — LOSARTAN POTASSIUM 50 MG PO TABS: 100.0000 mg | ORAL_TABLET | Freq: Every day | ORAL | Status: DC

## 2019-08-28 MED ORDER — ASPIRIN EC 81 MG PO TBEC
81.0000 mg | DELAYED_RELEASE_TABLET | Freq: Every day | ORAL | Status: DC
  Administered 2019-08-29: 81 mg via ORAL
  Filled 2019-08-28: qty 1

## 2019-08-28 MED ORDER — DOCUSATE SODIUM 100 MG PO CAPS
100.0000 mg | ORAL_CAPSULE | Freq: Two times a day (BID) | ORAL | Status: DC
  Administered 2019-08-28 – 2019-08-29 (×2): 100 mg via ORAL
  Filled 2019-08-28 (×2): qty 1

## 2019-08-28 MED ORDER — AMLODIPINE BESYLATE 10 MG PO TABS
10.0000 mg | ORAL_TABLET | Freq: Every day | ORAL | Status: DC
  Administered 2019-08-29: 10 mg via ORAL
  Filled 2019-08-28: qty 1

## 2019-08-28 MED ORDER — CLOPIDOGREL BISULFATE 75 MG PO TABS
75.0000 mg | ORAL_TABLET | Freq: Every day | ORAL | Status: DC
  Administered 2019-08-29: 10:00:00 75 mg via ORAL
  Filled 2019-08-28: qty 1

## 2019-08-28 MED ORDER — ONDANSETRON HCL 4 MG/2ML IJ SOLN: 4.0000 mg | Freq: Four times a day (QID) | INTRAMUSCULAR | Status: DC | PRN

## 2019-08-28 MED ORDER — VITAMIN B-12 1000 MCG PO TABS
1000.0000 ug | ORAL_TABLET | Freq: Every day | ORAL | Status: DC
  Administered 2019-08-29: 1000 ug via ORAL
  Filled 2019-08-28: qty 1

## 2019-08-28 MED ORDER — BISACODYL 5 MG PO TBEC: 5.0000 mg | DELAYED_RELEASE_TABLET | Freq: Every day | ORAL | Status: DC | PRN

## 2019-08-28 MED ORDER — TRAZODONE HCL 50 MG PO TABS: 25.0000 mg | ORAL_TABLET | Freq: Every evening | ORAL | Status: DC | PRN

## 2019-08-28 MED ORDER — ROFLUMILAST 500 MCG PO TABS
250.0000 ug | ORAL_TABLET | Freq: Every day | ORAL | Status: DC
  Administered 2019-08-29: 250 ug via ORAL
  Filled 2019-08-28: qty 1

## 2019-08-28 MED ORDER — LOPERAMIDE HCL 2 MG PO CAPS: 2.0000 mg | ORAL_CAPSULE | ORAL | Status: DC | PRN

## 2019-08-28 MED ORDER — ONDANSETRON HCL 4 MG PO TABS: 4.0000 mg | ORAL_TABLET | Freq: Four times a day (QID) | ORAL | Status: DC | PRN

## 2019-08-28 MED ORDER — SODIUM CHLORIDE 0.9 % IV SOLN: INTRAVENOUS | Status: DC

## 2019-08-28 MED ORDER — GABAPENTIN 100 MG PO CAPS
200.0000 mg | ORAL_CAPSULE | Freq: Every day | ORAL | Status: DC
  Administered 2019-08-28: 22:00:00 200 mg via ORAL
  Filled 2019-08-28 (×2): qty 2

## 2019-08-28 MED ORDER — HYDRALAZINE HCL 50 MG PO TABS: 50.0000 mg | ORAL_TABLET | Freq: Three times a day (TID) | ORAL | Status: DC

## 2019-08-28 MED ORDER — LACTATED RINGERS IV BOLUS
500.0000 mL | Freq: Once | INTRAVENOUS | Status: AC
  Administered 2019-08-28: 16:00:00 500 mL via INTRAVENOUS

## 2019-08-28 MED ORDER — VITAMIN D 25 MCG (1000 UNIT) PO TABS
1000.0000 [IU] | ORAL_TABLET | Freq: Every day | ORAL | Status: DC
  Administered 2019-08-29: 10:00:00 1000 [IU] via ORAL
  Filled 2019-08-28: qty 1

## 2019-08-28 MED ORDER — HEPARIN SODIUM (PORCINE) 5000 UNIT/ML IJ SOLN
5000.0000 [IU] | Freq: Three times a day (TID) | INTRAMUSCULAR | Status: DC
  Administered 2019-08-28 – 2019-08-29 (×2): 5000 [IU] via SUBCUTANEOUS
  Filled 2019-08-28 (×2): qty 1

## 2019-08-28 NOTE — H&P (Signed)
[place] [facility] [place] NAME: Tina [name]    MR#:  [id number]  DATE OF BIRTH:  [birth year]  DATE OF ADMISSION:  08/28/2019  PRIMARY CARE PHYSICIAN: Einar Pheasant, MD   REQUESTING/REFERRING PHYSICIAN: Blake Divine, MD  CHIEF COMPLAINT:   Chief Complaint  Patient presents with  . Shortness of Breath    HISTORY OF PRESENT ILLNESS:  Tina [name]  is a 85 y.o. female with a known history of CAD, diastolic CHF, COPD, hypertension, CKD is being admitted for acute on chronic kidney disease stage IIIb.  She reports shortness of breath mainly on exertion.  Patient reports that she got short of breath today while performing her usual physical therapy.  She was recently discharged home from the hospital on 2 L nasal cannula and she states that they have been taking off her oxygen when she works with physical therapy.  After returning to her room from physical therapy at [facility], she replaced her oxygen and her breathing gradually improved from there.    Chest x-ray shows small pleural effusion.  Patient does not want go back to [place] would like to go home with oxygen and physical therapy.  She was seen by [facility] outpatient palliative care on 4/14. PAST MEDICAL HISTORY:   Past Medical History:  Diagnosis Date  . Anemia   . Asthma   . Bladder cancer ([place])   . BRCA positive   . CAD (coronary artery disease)   . CHF (congestive heart failure) ([place])   . COPD (chronic obstructive pulmonary disease) ([place])   . Emphysema lung ([place])   . GERD (gastroesophageal reflux disease)   . History of colon polyps   . Hypercholesterolemia   . Hyperglycemia   . Hypertension   . Lung nodules   . Osteoporosis   . Personal history of tobacco use, presenting hazards to health 11/25/2014  . Polycythemia vera(238.4)   . Renal cyst   . Skin cancer    PAST SURGICAL HISTORY:   Past Surgical History:  Procedure Laterality Date  . ANGIOPLASTY     coronary  (x1)  . COLONOSCOPY WITH PROPOFOL N/A 03/02/2015   Procedure: COLONOSCOPY WITH PROPOFOL;  Surgeon: Lollie Sails, MD;  Location: [facility] ENDOSCOPY;  Service: Endoscopy;  Laterality: N/A;  . COLONOSCOPY WITH PROPOFOL N/A 02/13/2017   Procedure: COLONOSCOPY WITH PROPOFOL;  Surgeon: [name], MD;  Location: [facility] ENDOSCOPY;  Service: Endoscopy;  Laterality: N/A;  . CORONARY ANGIOPLASTY    . CORONARY ARTERY BYPASS GRAFT  09/17/2017   pt denies  . CYSTOSCOPY W/ RETROGRADES Bilateral 02/13/2017   Procedure: CYSTOSCOPY WITH RETROGRADE PYELOGRAM;  Surgeon: Hollice Espy, MD;  Location: [facility] ORS;  Service: Urology;  Laterality: Bilateral;  . CYSTOSCOPY W/ RETROGRADES Bilateral 09/19/2017   Procedure: CYSTOSCOPY WITH RETROGRADE PYELOGRAM;  Surgeon: Hollice Espy, MD;  Location: [facility] ORS;  Service: Urology;  Laterality: Bilateral;  . CYSTOSCOPY WITH BIOPSY  03/26/2017   Procedure: CYSTOSCOPY WITH BIOPSY;  Surgeon: Hollice Espy, MD;  Location: [facility] ORS;  Service: Urology;;  . Consuela Mimes WITH STENT PLACEMENT Bilateral 02/13/2017   Procedure: CYSTOSCOPY WITH STENT PLACEMENT;  Surgeon: Hollice Espy, MD;  Location: [facility] ORS;  Service: Urology;  Laterality: Bilateral;  . CYSTOSCOPY WITH STENT PLACEMENT Bilateral 03/26/2017   Procedure: CYSTOSCOPY WITH STENT EXCHANGE;  Surgeon: Hollice Espy, MD;  Location: [facility] ORS;  Service: Urology;  Laterality: Bilateral;  . ESOPHAGOGASTRODUODENOSCOPY (EGD) WITH PROPOFOL N/A 02/13/2017   Procedure: ESOPHAGOGASTRODUODENOSCOPY (EGD) WITH PROPOFOL;  Surgeon: Allen Norris,  Darren, MD;  Location: [place] ENDOSCOPY;  Service: Endoscopy;  Laterality: N/A;  . PORTA CATH INSERTION N/A 02/28/2017   Procedure: PORTA CATH INSERTION;  Surgeon: Algernon Huxley, MD;  Location: [place] CV LAB;  Service: Cardiovascular;  Laterality: N/A;  . PORTA CATH REMOVAL N/A 12/24/2017   Procedure: PORTA CATH REMOVAL;  Surgeon: Algernon Huxley, MD;  Location: [place] CV LAB;  Service:  Cardiovascular;  Laterality: N/A;  . SALPINGOOPHORECTOMY    . TONSILECTOMY/ADENOIDECTOMY WITH MYRINGOTOMY    . TONSILLECTOMY    . TRANSURETHRAL RESECTION OF BLADDER TUMOR N/A 02/13/2017   Procedure: TRANSURETHRAL RESECTION OF BLADDER TUMOR (TURBT);  Surgeon: Hollice Espy, MD;  Location: [facility] ORS;  Service: Urology;  Laterality: N/A;  . TUBAL LIGATION    . UPPER GI ENDOSCOPY  02/13/2017  . URETEROSCOPY Left 09/19/2017   Procedure: URETEROSCOPY;  Surgeon: Hollice Espy, MD;  Location: [facility] ORS;  Service: Urology;  Laterality: Left;  Marland Kitchen VISCERAL ANGIOGRAPHY N/A 01/23/2019   Procedure: VISCERAL ANGIOGRAPHY;  Surgeon: Algernon Huxley, MD;  Location: [place] CV LAB;  Service: Cardiovascular;  Laterality: N/A;  . VISCERAL ANGIOGRAPHY N/A 06/09/2019   Procedure: VISCERAL ANGIOGRAPHY;  Surgeon: Algernon Huxley, MD;  Location: [place] CV LAB;  Service: Cardiovascular;  Laterality: N/A;  . VISCERAL ARTERY INTERVENTION N/A 02/12/2017   Procedure: VISCERAL ARTERY INTERVENTION;  Surgeon: Algernon Huxley, MD;  Location: [place] CV LAB;  Service: Cardiovascular;  Laterality: N/A;   SOCIAL HISTORY:   Social History   Tobacco Use  . Smoking status: Former Smoker    Packs/day: 1.00    Years: 45.00    Pack years: 45.00    Types: Cigarettes    Quit date: 11/11/2003    Years since quitting: 15.8  . Smokeless tobacco: Never Used  Substance Use Topics  . Alcohol use: Not Currently    Alcohol/week: 0.0 standard drinks   FAMILY HISTORY:   Family History  Problem Relation Age of Onset  . Cirrhosis Father        died age 93  . Alcohol abuse Father   . Asthma Mother   . Congestive Heart Failure Mother   . Breast cancer Mother        2 (1/2 sisters)  . Osteoarthritis Mother   . Colon cancer Mother   . Lupus Sister   . Alcohol abuse Sister   . Ovarian cancer Sister   . Osteoporosis Sister   . Skin cancer Sister   . Breast cancer Cousin   . Breast cancer Maternal Aunt    DRUG ALLERGIES:     Allergies  Allergen Reactions  . Evista [Raloxifene] Other (See Comments)    Night sweats   . Fluticasone-Salmeterol Other (See Comments)    Cough, "chokes me"    REVIEW OF SYSTEMS:  Review of Systems  Constitutional: Positive for malaise/fatigue and weight loss. Negative for diaphoresis and fever.  HENT: Negative for ear discharge, ear pain, hearing loss, nosebleeds, sore throat and tinnitus.   Eyes: Negative for blurred vision and pain.  Respiratory: Positive for shortness of breath. Negative for cough, hemoptysis and wheezing.   Cardiovascular: Negative for chest pain, palpitations, orthopnea and leg swelling.  Gastrointestinal: Negative for abdominal pain, blood in stool, constipation, diarrhea, heartburn, nausea and vomiting.  Genitourinary: Negative for dysuria, frequency and urgency.  Musculoskeletal: Negative for back pain and myalgias.  Skin: Negative for itching and rash.  Neurological: Negative for dizziness, tingling, tremors, focal weakness, seizures, weakness and headaches.  Psychiatric/Behavioral: Negative for depression. The patient is not nervous/anxious.    MEDICATIONS AT HOME:   Prior to Admission medications   Medication Sig Start Date End Date Taking? Authorizing Provider  amLODipine (NORVASC) 10 MG tablet Take 1 tablet (10 mg total) by mouth daily. 08/23/19  Yes Danford, Suann Larry, MD  aspirin EC 81 MG tablet Take 1 tablet (81 mg total) by mouth daily. 01/23/19  Yes Dew, Erskine Squibb, MD  cholecalciferol (VITAMIN D) 1000 units tablet Take 1,000 Units by mouth daily.   Yes [provider]  clopidogrel (PLAVIX) 75 MG tablet Take 1 tablet (75 mg total) by mouth daily. 03/29/18  Yes Einar Pheasant, MD  cyanocobalamin 1000 MCG tablet Take 1,000 mcg by mouth daily.    Yes [provider]  gabapentin (NEURONTIN) 100 MG capsule Take 200 mg by mouth at bedtime.  06/02/19  Yes [provider]  hydrALAZINE (APRESOLINE) 50 MG tablet Take 50 mg by  mouth 3 (three) times daily.    Yes [provider]  labetalol (NORMODYNE) 200 MG tablet Take 1 tablet (200 mg total) by mouth 2 (two) times daily. Patient taking differently: Take 200 mg by mouth daily in the afternoon.  08/22/19  Yes Danford, Suann Larry, MD  losartan (COZAAR) 100 MG tablet Take 1 tablet (100 mg total) by mouth daily. 08/23/19  Yes Danford, Suann Larry, MD  lovastatin (MEVACOR) 40 MG tablet TAKE 1 TABLET BY MOUTH  DAILY IN THE EVENING Patient taking differently: Take 40 mg by mouth every evening.  06/25/19  Yes Einar Pheasant, MD  roflumilast (DALIRESP) 500 MCG TABS tablet Take 0.5 tablets (250 mcg total) by mouth daily. 08/23/19  Yes Danford, Suann Larry, MD  SYMBICORT 160-4.5 MCG/ACT inhaler USE 2 PUFFS BY MOUTH TWO  TIMES DAILY Patient taking differently: Inhale 2 puffs into the lungs in the morning and at bedtime.  06/30/19  Yes Einar Pheasant, MD  albuterol (PROVENTIL HFA;VENTOLIN HFA) 108 (90 Base) MCG/ACT inhaler Inhale 2 puffs into the lungs every 6 (six) hours as needed for wheezing or shortness of breath. 10/22/17   Bettey Costa, MD  fluticasone (FLONASE) 50 MCG/ACT nasal spray USE 2 SPRAYS INTO BOTH  NOSTRILS DAILY AS NEEDED  FOR ALLERGIES. Patient taking differently: Place 2 sprays into both nostrils daily.  10/01/18   Einar Pheasant, MD  ipratropium-albuterol (DUONEB) 0.5-2.5 (3) MG/3ML SOLN Take 3 mLs by nebulization every 6 (six) hours as needed (for shortness of breath). 11/14/14   Joanne Gavel, MD  loperamide (IMODIUM) 2 MG capsule Take 1 capsule (2 mg total) by mouth as needed for diarrhea or loose stools. 08/22/19   Danford, Suann Larry, MD  predniSONE (DELTASONE) 10 MG tablet Take 40 mg for 2 days then take 30 mg for 2 days then take 20 mg for 2 days then take 10 mg for 2 days then stop Patient not taking: Reported on 08/28/2019 08/22/19   Edwin Dada, MD  torsemide (DEMADEX) 20 MG tablet Take 1 tablet (20 mg total) by mouth daily as needed  (swelling or shortness of breath). 08/06/19   Ezekiel Slocumb, DO    VITAL SIGNS:  Blood pressure (!) 137/52, pulse (!) 107, temperature 98.2 F (36.8 C), temperature source Oral, resp. rate 20, height '5\' 5"'  (1.651 m), weight 60.3 kg, SpO2 98 %. PHYSICAL EXAMINATION:  Physical Exam  GENERAL:  86 y.o.-year-old chronically ill, frail appearing, thin, pleasant female EYES: Pupils equal, round, reactive to light and accommodation. No scleral icterus. Extraocular  muscles intact.  HEENT: Head atraumatic, normocephalic. Oropharynx and nasopharynx clear.  NECK:  Supple, no jugular venous distention. No thyroid enlargement, no tenderness.  LUNGS: Normal breath sounds bilaterally, no wheezing, rales,rhonchi or crepitation. No use of accessory muscles of respiration.  CARDIOVASCULAR: S1, S2 normal. No murmurs, rubs, or gallops.  ABDOMEN: Soft, nontender, nondistended. Bowel sounds present. No organomegaly or mass.  EXTREMITIES: No pedal edema, cyanosis, or clubbing.  NEUROLOGIC: Cranial nerves II through XII are intact. Muscle strength 5/5 in all extremities. Sensation intact. Gait not checked.  PSYCHIATRIC: The patient is alert and oriented x 3.  SKIN: No obvious rash, lesion, or ulcer.  LABORATORY PANEL:   CBC Recent Labs  Lab 08/28/19 1304  WBC 12.2*  HGB 12.7  HCT 38.8  PLT 199   ------------------------------------------------------------------------------------------------------------------  Chemistries  Recent Labs  Lab 08/22/19 0538 08/22/19 0538 08/28/19 1304  NA 133*   < > 136  K 3.5   < > 4.7  CL 91*   < > 98  CO2 32   < > 27  GLUCOSE 110*   < > 139*  BUN 41*   < > 42*  CREATININE 1.01*   < > 1.81*  CALCIUM 9.0   < > 9.2  MG 3.1*  --   --   AST  --   --  13*  ALT  --   --  15  ALKPHOS  --   --  56  BILITOT  --   --  1.3*   < > = values in this interval not displayed.    ------------------------------------------------------------------------------------------------------------------  Cardiac Enzymes No results for input(s): TROPONINI in the last 168 hours. ------------------------------------------------------------------------------------------------------------------  RADIOLOGY:  DG Chest Portable 1 View  Result Date: 08/28/2019 CLINICAL DATA:  Shortness of breath. EXAM: PORTABLE CHEST 1 VIEW COMPARISON:  August 16, 2019. FINDINGS: Stable cardiomegaly. No pneumothorax is noted. Right lung is clear. Mild left basilar atelectasis or infiltrate is noted with associated effusion. Bony thorax is unremarkable. IMPRESSION: Mild left basilar atelectasis or infiltrate is noted with associated pleural effusion. Electronically Signed   By: Marijo Conception M.D.   On: 08/28/2019 13:24   IMPRESSION AND PLAN:  Mrs. Bonello a 85 y.o.Fwith COPD, HTN, dCHF< CAD, CKD IIIb whopresented with progressive dyspnea on exertion.  Labs show worsening renal function.  She is being admitted for acute on chronic kidney disease stage IIIb.  Please note this is her third readmission in last 12-month  Last admission was from 08/13/2019 to 08/22/2019  Acute on CKD stage IIIb -Gentle hydration and monitor kidney function.  Hold diuretics  COPD - Chronic hypoxic respiratory failure Stable.  Uses 2 L oxygen via nasal cannula chronically  chronic diastolic CHF Mild left pleural effusion Recent echocardiogram last month showed EF 50 to 55% with grade 1 diastolic dysfunction.   History of pulmonary embolism Completed 6 months of anticoagulant  Hypotension with a history of hypertension Coronary artery disease, secondary prevention Peripheral vascular disease with stent placement SMA artery January 2021 Continue aspirin, Plavix, pravastatin.   Hold losartan, labetalol, hydralazine, continue amlodipine.  Peripheral neuropathy Continue gabapentin  Severe protein calorie  malnutrition As evidenced by chronic lung disease, poor oral intake, and severe depletion of subcutaneous muscle mass and fat, BMI 22.  Weight loss Unintentional  Overall poor prognosis.  Will consult palliative care while here PT/OT eval while here -will benefit from home health PT    All the records are reviewed and case discussed with ED provider.  Management plans discussed with the patient, family (daughter at bedside) and they are in agreement.  CODE STATUS: DNR  TOTAL TIME TAKING CARE OF THIS PATIENT: 45 minutes.    Max Sane M.D on 08/28/2019 at 4:45 PM  [facility]   CC: Primary care physician; Einar Pheasant, MD   Note: This dictation was prepared with Dragon dictation along with smaller phrase technology. Any transcriptional errors that result from this process are unintentional.

## 2019-08-28 NOTE — ED Triage Notes (Signed)
Pt arrives via EMS from [facility] for [facility]- per the staff she has  A pleural effusion per an xray they did after she continued to complain of SHOB during her therapy- pt was d/c'ed from the hospital on O2 and is on 2L [place] for EMS but [place] had her on 5L [place]

## 2019-08-28 NOTE — ED Notes (Signed)
ED TO INPATIENT HANDOFF REPORT  ED Nurse Name and Phone #: [place] Name/Age/Gender Tina [name] 85 y.o. female Room/Bed: ED24A/ED24A  Code Status   Code Status: DNR  Home/SNF/Other Home Patient oriented to: self, place, time and situation Is this baseline? Yes   Triage Complete: Triage complete  Chief Complaint Acute renal failure (ARF) ([place]) [N17.9]  Triage Note Pt arrives via EMS from [facility] [place] [facility] for [facility]- per the staff she has  A pleural effusion per an xray they did after she continued to complain of SHOB during her therapy- pt was d/c'ed from the hospital on O2 and is on 2L [place] for EMS but  healthcare had her on 5L [place]    Allergies Allergies  Allergen Reactions  . Evista [Raloxifene] Other (See Comments)    Night sweats   . Fluticasone-Salmeterol Other (See Comments)    Cough, "chokes me"     Level of Care/Admitting Diagnosis ED Disposition    ED Disposition Condition [facility] Area: [place] [[id number]]  Level of Care: Med-Surg [16]  Covid Evaluation: Asymptomatic Screening Protocol (No Symptoms)  Diagnosis: Acute renal failure (ARF) [facility]) [[id number]]  Admitting Physician: [place], [place]  Attending Physician: Max Sane [[id number]]       B Medical/Surgery History Past Medical History:  Diagnosis Date  . Anemia   . Asthma   . Bladder cancer ([place])   . BRCA positive   . CAD (coronary artery disease)   . CHF (congestive heart failure) ([place])   . COPD (chronic obstructive pulmonary disease) ([place])   . Emphysema lung ([place])   . GERD (gastroesophageal reflux disease)   . History of colon polyps   . Hypercholesterolemia   . Hyperglycemia   . Hypertension   . Lung nodules   . Osteoporosis   . Personal history of tobacco use, presenting hazards to health 11/25/2014  . Polycythemia vera(238.4)   . Renal cyst   . Skin cancer    Past Surgical History:  Procedure Laterality Date  . ANGIOPLASTY      coronary (x1)  . COLONOSCOPY WITH PROPOFOL N/A 03/02/2015   Procedure: COLONOSCOPY WITH PROPOFOL;  Surgeon: Lollie Sails, MD;  Location: [facility] ENDOSCOPY;  Service: Endoscopy;  Laterality: N/A;  . COLONOSCOPY WITH PROPOFOL N/A 02/13/2017   Procedure: COLONOSCOPY WITH PROPOFOL;  Surgeon: [name], MD;  Location: [facility] ENDOSCOPY;  Service: Endoscopy;  Laterality: N/A;  . CORONARY ANGIOPLASTY    . CORONARY ARTERY BYPASS GRAFT  09/17/2017   pt denies  . CYSTOSCOPY W/ RETROGRADES Bilateral 02/13/2017   Procedure: CYSTOSCOPY WITH RETROGRADE PYELOGRAM;  Surgeon: Hollice Espy, MD;  Location: [facility] ORS;  Service: Urology;  Laterality: Bilateral;  . CYSTOSCOPY W/ RETROGRADES Bilateral 09/19/2017   Procedure: CYSTOSCOPY WITH RETROGRADE PYELOGRAM;  Surgeon: Hollice Espy, MD;  Location: [facility] ORS;  Service: Urology;  Laterality: Bilateral;  . CYSTOSCOPY WITH BIOPSY  03/26/2017   Procedure: CYSTOSCOPY WITH BIOPSY;  Surgeon: Hollice Espy, MD;  Location: [facility] ORS;  Service: Urology;;  . Consuela Mimes WITH STENT PLACEMENT Bilateral 02/13/2017   Procedure: CYSTOSCOPY WITH STENT PLACEMENT;  Surgeon: Hollice Espy, MD;  Location: [facility] ORS;  Service: Urology;  Laterality: Bilateral;  . CYSTOSCOPY WITH STENT PLACEMENT Bilateral 03/26/2017   Procedure: CYSTOSCOPY WITH STENT EXCHANGE;  Surgeon: Hollice Espy, MD;  Location: [facility] ORS;  Service: Urology;  Laterality: Bilateral;  . ESOPHAGOGASTRODUODENOSCOPY (EGD) WITH PROPOFOL N/A 02/13/2017   Procedure: ESOPHAGOGASTRODUODENOSCOPY (EGD) WITH PROPOFOL;  Surgeon: [name], MD;  Location: [facility] ENDOSCOPY;  Service: Endoscopy;  Laterality: N/A;  . PORTA CATH INSERTION N/A 02/28/2017   Procedure: PORTA CATH INSERTION;  Surgeon: Algernon Huxley, MD;  Location: [place] CV LAB;  Service: Cardiovascular;  Laterality: N/A;  . PORTA CATH REMOVAL N/A 12/24/2017   Procedure: PORTA CATH REMOVAL;  Surgeon: Algernon Huxley, MD;  Location: [place] CV LAB;  Service:  Cardiovascular;  Laterality: N/A;  . SALPINGOOPHORECTOMY    . TONSILECTOMY/ADENOIDECTOMY WITH MYRINGOTOMY    . TONSILLECTOMY    . TRANSURETHRAL RESECTION OF BLADDER TUMOR N/A 02/13/2017   Procedure: TRANSURETHRAL RESECTION OF BLADDER TUMOR (TURBT);  Surgeon: Hollice Espy, MD;  Location: [facility] ORS;  Service: Urology;  Laterality: N/A;  . TUBAL LIGATION    . UPPER GI ENDOSCOPY  02/13/2017  . URETEROSCOPY Left 09/19/2017   Procedure: URETEROSCOPY;  Surgeon: Hollice Espy, MD;  Location: [facility] ORS;  Service: Urology;  Laterality: Left;  Marland Kitchen VISCERAL ANGIOGRAPHY N/A 01/23/2019   Procedure: VISCERAL ANGIOGRAPHY;  Surgeon: Algernon Huxley, MD;  Location: [place] [facility] LAB;  Service: Cardiovascular;  Laterality: N/A;  . VISCERAL ANGIOGRAPHY N/A 06/09/2019   Procedure: VISCERAL ANGIOGRAPHY;  Surgeon: Algernon Huxley, MD;  Location: [place] CV LAB;  Service: Cardiovascular;  Laterality: N/A;  . VISCERAL ARTERY INTERVENTION N/A 02/12/2017   Procedure: VISCERAL ARTERY INTERVENTION;  Surgeon: Algernon Huxley, MD;  Location: [place] [facility] LAB;  Service: Cardiovascular;  Laterality: N/A;     A IV Location/Drains/Wounds Patient Lines/Drains/Airways Status   Active Line/Drains/Airways    Name:   Placement date:   Placement time:   Site:   Days:   Peripheral IV 08/28/19 Left Antecubital   08/28/19    1328    Antecubital   less than 1   External Urinary Catheter   08/16/19    2108    --   12          Intake/Output Last 24 hours  Intake/Output Summary (Last 24 hours) at 08/28/2019 1916 Last data filed at 08/28/2019 1750 Gross per 24 hour  Intake 500 ml  Output --  Net 500 ml    Labs/Imaging Results for orders placed or performed during the hospital encounter of 08/28/19 (from the past 48 hour(s))  CBC     Status: Abnormal   Collection Time: 08/28/19  1:04 PM  Result Value Ref Range   WBC 12.2 (H) 4.0 - 10.5 K/uL   RBC 4.12 3.87 - 5.11 MIL/uL   Hemoglobin 12.7 12.0 - 15.0 g/dL   HCT 38.8 36.0  - 46.0 %   MCV 94.2 80.0 - 100.0 fL   MCH 30.8 26.0 - 34.0 pg   MCHC 32.7 30.0 - 36.0 g/dL   RDW 14.0 11.5 - 15.5 %   Platelets 199 150 - 400 K/uL   nRBC 0.0 0.0 - 0.2 %    Comment: Performed at [facility], [address] [place], [place] [address]  Comprehensive metabolic panel     Status: Abnormal   Collection Time: 08/28/19  1:04 PM  Result Value Ref Range   Sodium 136 135 - 145 mmol/L   Potassium 4.7 3.5 - 5.1 mmol/L    Comment: HEMOLYSIS AT THIS LEVEL MAY AFFECT RESULT   Chloride 98 98 - 111 mmol/L   CO2 27 22 - 32 mmol/L   Glucose, Bld 139 (H) 70 - 99 mg/dL    Comment: Glucose reference range applies only to samples taken after fasting for at least 8 hours.  BUN 42 (H) 8 - 23 mg/dL   Creatinine, Ser 1.81 (H) 0.44 - 1.00 mg/dL   Calcium 9.2 8.9 - 10.3 mg/dL   Total Protein 6.1 (L) 6.5 - 8.1 g/dL   Albumin 3.5 3.5 - 5.0 g/dL   AST 13 (L) 15 - 41 U/L   ALT 15 0 - 44 U/L   Alkaline Phosphatase 56 38 - 126 U/L   Total Bilirubin 1.3 (H) 0.3 - 1.2 mg/dL   GFR calc non Af Amer 26 (L) >60 mL/min   GFR calc Af Amer 30 (L) >60 mL/min   Anion gap 11 5 - 15    Comment: Performed at [facility], [place], [place] [address]  Troponin I (High Sensitivity)     Status: Abnormal   Collection Time: 08/28/19  1:28 PM  Result Value Ref Range   Troponin I (High Sensitivity) 27 (H) <18 ng/L    Comment: (NOTE) Elevated high sensitivity troponin I (hsTnI) values and significant  changes across serial measurements may suggest ACS but many other  chronic and acute conditions are known to elevate hsTnI results.  Refer to the "Links" section for chest pain algorithms and additional  guidance. Performed at [facility], [address] [place], [place] [address]   Brain natriuretic peptide     Status: Abnormal   Collection Time: 08/28/19  3:37 PM  Result Value Ref Range   B Natriuretic Peptide 242.0 (H) 0.0 - 100.0 pg/mL    Comment: Performed  at [facility], [place], [place] [address]  Troponin I (High Sensitivity)     Status: None   Collection Time: 08/28/19  3:37 PM  Result Value Ref Range   Troponin I (High Sensitivity) 15 <18 ng/L    Comment: (NOTE) Elevated high sensitivity troponin I (hsTnI) values and significant  changes across serial measurements may suggest ACS but many other  chronic and acute conditions are known to elevate hsTnI results.  Refer to the "Links" section for chest pain algorithms and additional  guidance. Performed at [facility], [address] [place], [place] [address]   TSH     Status: None   Collection Time: 08/28/19  3:37 PM  Result Value Ref Range   TSH 1.531 0.350 - 4.500 uIU/mL    Comment: Performed by a 3rd Generation assay with a functional sensitivity of <=0.01 uIU/mL. Performed at [facility], [address] [place], [place] [address]    DG Chest Portable 1 View  Result Date: 08/28/2019 CLINICAL DATA:  Shortness of breath. EXAM: PORTABLE CHEST 1 VIEW COMPARISON:  August 16, 2019. FINDINGS: Stable cardiomegaly. No pneumothorax is noted. Right lung is clear. Mild left basilar atelectasis or infiltrate is noted with associated effusion. Bony thorax is unremarkable. IMPRESSION: Mild left basilar atelectasis or infiltrate is noted with associated pleural effusion. Electronically Signed   By: Marijo Conception M.D.   On: 08/28/2019 13:24    Pending Labs Unresulted Labs (From admission, onward)    Start     Ordered   08/29/19 4132  Basic metabolic panel  Tomorrow morning,   STAT     08/28/19 1635   08/29/19 0500  CBC  Tomorrow morning,   STAT     08/28/19 1635   08/28/19 1540  SARS CORONAVIRUS 2 (TAT 6-24 HRS) Nasopharyngeal Nasopharyngeal Swab  (Tier 3 (TAT 6-24 hrs))  Once,   STAT    Question Answer Comment  Is this test  for diagnosis or screening Screening   Symptomatic for COVID-19 as defined by CDC No   Hospitalized for COVID-19 No    Admitted to ICU for COVID-19 No   Previously tested for COVID-19 Yes   Resident in a congregate (group) care setting No   Employed in healthcare setting No   Pregnant No      08/28/19 1539          Vitals/Pain Today's Vitals   08/28/19 1630 08/28/19 1700 08/28/19 1730 08/28/19 1900  BP: (!) 137/52 (!) 154/54 133/77 (!) 164/84  Pulse:  98 89 76  Resp: 20 (!) 26  20  Temp:      TempSrc:      SpO2:  99% 98% 98%  Weight:      Height:      PainSc:        Isolation Precautions No active isolations  Medications Medications  cholecalciferol (VITAMIN D3) tablet 1,000 Units (has no administration in time range)  clopidogrel (PLAVIX) tablet 75 mg (has no administration in time range)  aspirin EC tablet 81 mg (has no administration in time range)  gabapentin (NEURONTIN) capsule 200 mg (has no administration in time range)  vitamin B-12 (CYANOCOBALAMIN) tablet 1,000 mcg (has no administration in time range)  amLODipine (NORVASC) tablet 10 mg (has no administration in time range)  loperamide (IMODIUM) capsule 2 mg (has no administration in time range)  roflumilast (DALIRESP) tablet 250 mcg (has no administration in time range)  heparin injection 5,000 Units (has no administration in time range)  0.9 %  sodium chloride infusion (has no administration in time range)  traZODone (DESYREL) tablet 25 mg (has no administration in time range)  docusate sodium (COLACE) capsule 100 mg (has no administration in time range)  bisacodyl (DULCOLAX) EC tablet 5 mg (has no administration in time range)  ondansetron (ZOFRAN) tablet 4 mg (has no administration in time range)    Or  ondansetron (ZOFRAN) injection 4 mg (has no administration in time range)  lactated ringers bolus 500 mL (0 mLs Intravenous Stopped 08/28/19 1750)    Mobility walks with person assist Low fall risk   Focused Assessments Pulmonary Assessment Handoff:  Lung sounds:   O2 Device: Nasal Cannula O2 Flow Rate (L/min):  2 L/min      R Recommendations: See Admitting Provider Note  Report given to:   Additional Notes:

## 2019-08-28 NOTE — ED Provider Notes (Signed)
[facility] Emergency Department Provider Note   ____________________________________________   First MD Initiated Contact with Patient 08/28/19 1259     (approximate)  I have reviewed the triage vital signs and the nursing notes.   HISTORY  Chief Complaint Shortness of Breath    HPI Tina [name] is a 85 y.o. female with possible history of CAD, diastolic CHF, COPD, hypertension, CKD who presents to the ED for shortness of breath.  Patient reports that she got short of breath today while performing her usual physical therapy.  She was recently discharged home from the hospital on 2 L nasal cannula and she states that they have been taking off her oxygen when she works with physical therapy.  After returning to her room from physical therapy at [place], she replaced her oxygen and her breathing gradually improved from there.  She now feels like she is back to her baseline, denies any fevers or cough recently.  She did not have any chest pain with the episode today and has otherwise been feeling well recently.  An x-ray was performed at [place] and was concerning for pleural effusion, therefore was recommended that patient be evaluated in the ED.        Past Medical History:  Diagnosis Date  . Anemia   . Asthma   . Bladder cancer ([place])   . BRCA positive   . CAD (coronary artery disease)   . CHF (congestive heart failure) ([place])   . COPD (chronic obstructive pulmonary disease) ([place])   . Emphysema lung ([place])   . GERD (gastroesophageal reflux disease)   . History of colon polyps   . Hypercholesterolemia   . Hyperglycemia   . Hypertension   . Lung nodules   . Osteoporosis   . Personal history of tobacco use, presenting hazards to health 11/25/2014  . Polycythemia vera(238.4)   . Renal cyst   . Skin cancer     Patient Active Problem List   Diagnosis Date Noted  . Palliative care by specialist   . DNR (do not resuscitate) discussion     . Pleural effusion, left 08/17/2019  . Peripheral neuropathy 08/17/2019  . History of pulmonary embolism 08/17/2019  . Dyspnea 08/13/2019  . COPD with acute exacerbation ([place]) 08/02/2019  . Acute on chronic diastolic CHF (congestive heart failure) ([place]) 08/02/2019  . Abnormality of gait 08/02/2019  . Renal artery stenosis ([place]) 07/01/2019  . DOE (dyspnea on exertion) 05/25/2019  . Diarrhea 05/10/2019  . AKI (acute kidney injury) ([place])   . Hypokalemia   . Foot drop 04/02/2019  . Low back pain 03/16/2019  . Leg pain 03/16/2019  . Aortic atherosclerosis ([place]) 01/07/2019  . Hoarseness 06/23/2018  . Unsteadiness 05/11/2018  . CHF (congestive heart failure) ([place]) 10/20/2017  . Pulmonary embolus ([place]) 06/28/2017  . 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 Goals of care, counseling/discussion 03/02/2017  . Abnormal computed tomography of gastrointestinal tract   . Noninfectious diarrhea   . Ulceration of intestine   . Hernia, hiatal   . Bladder mass   . Mesenteric ischemia due to arterial insufficiency ([place])   . Polycythemia vera ([place]) 10/21/2016  . Elevated troponin 06/14/2016  . CKD (chronic kidney disease), stage III ([place]) 06/14/2016  . Leukocytosis 06/14/2016  . Abdominal bruit 04/16/2016  . Abnormal mammogram 06/06/2015  . Essential hypertension 02/16/2015  . Numbness 01/24/2015  . Personal history of tobacco use, presenting hazards to health 11/25/2014  . BRCA positive   .  COPD exacerbation ([place]) 11/14/2014  . Sensation of pressure in bladder area 08/15/2014  . Health care maintenance 08/15/2014  . Presence of coronary angioplasty implant and graft 09/30/2013  . History of colonic polyps 08/25/2012  . Female genuine stress incontinence 06/28/2012  . Infection of urinary tract 06/28/2012  . CAD (coronary artery disease) 05/23/2012  . COPD, severe ([place]) 05/23/2012  . Hypercholesterolemia 05/23/2012  . Hyperglycemia 05/23/2012  . Polycythemia, secondary  05/23/2012  . Renal cyst 05/23/2012  . Osteoporosis 05/23/2012    Past Surgical History:  Procedure Laterality Date  . ANGIOPLASTY     coronary (x1)  . COLONOSCOPY WITH PROPOFOL N/A 03/02/2015   Procedure: COLONOSCOPY WITH PROPOFOL;  Surgeon: Lollie Sails, MD;  Location: [facility] ENDOSCOPY;  Service: Endoscopy;  Laterality: N/A;  . COLONOSCOPY WITH PROPOFOL N/A 02/13/2017   Procedure: COLONOSCOPY WITH PROPOFOL;  Surgeon: [name], MD;  Location: [facility]OSCOPY;  Service: Endoscopy;  Laterality: N/A;  . CORONARY ANGIOPLASTY    . CORONARY ARTERY BYPASS GRAFT  09/17/2017   pt denies  . CYSTOSCOPY W/ RETROGRADES Bilateral 02/13/2017   Procedure: CYSTOSCOPY WITH RETROGRADE PYELOGRAM;  Surgeon: Hollice Espy, MD;  Location: [facility] ORS;  Service: Urology;  Laterality: Bilateral;  . CYSTOSCOPY W/ RETROGRADES Bilateral 09/19/2017   Procedure: CYSTOSCOPY WITH RETROGRADE PYELOGRAM;  Surgeon: Hollice Espy, MD;  Location: [facility] ORS;  Service: Urology;  Laterality: Bilateral;  . CYSTOSCOPY WITH BIOPSY  03/26/2017   Procedure: CYSTOSCOPY WITH BIOPSY;  Surgeon: Hollice Espy, MD;  Location: [facility] ORS;  Service: Urology;;  . Consuela Mimes WITH STENT PLACEMENT Bilateral 02/13/2017   Procedure: CYSTOSCOPY WITH STENT PLACEMENT;  Surgeon: Hollice Espy, MD;  Location: [facility] ORS;  Service: Urology;  Laterality: Bilateral;  . CYSTOSCOPY WITH STENT PLACEMENT Bilateral 03/26/2017   Procedure: CYSTOSCOPY WITH STENT EXCHANGE;  Surgeon: Hollice Espy, MD;  Location: [facility] ORS;  Service: Urology;  Laterality: Bilateral;  . ESOPHAGOGASTRODUODENOSCOPY (EGD) WITH PROPOFOL N/A 02/13/2017   Procedure: ESOPHAGOGASTRODUODENOSCOPY (EGD) WITH PROPOFOL;  Surgeon: [name], MD;  Location: [facility] ENDOSCOPY;  Service: Endoscopy;  Laterality: N/A;  . PORTA CATH INSERTION N/A 02/28/2017   Procedure: PORTA CATH INSERTION;  Surgeon: Algernon Huxley, MD;  Location: [place] CV LAB;  Service: Cardiovascular;  Laterality: N/A;  .  PORTA CATH REMOVAL N/A 12/24/2017   Procedure: PORTA CATH REMOVAL;  Surgeon: Algernon Huxley, MD;  Location: [place] CV LAB;  Service: Cardiovascular;  Laterality: N/A;  . SALPINGOOPHORECTOMY    . TONSILECTOMY/ADENOIDECTOMY WITH MYRINGOTOMY    . TONSILLECTOMY    . TRANSURETHRAL RESECTION OF BLADDER TUMOR N/A 02/13/2017   Procedure: TRANSURETHRAL RESECTION OF BLADDER TUMOR (TURBT);  Surgeon: Hollice Espy, MD;  Location: [facility] ORS;  Service: Urology;  Laterality: N/A;  . TUBAL LIGATION    . UPPER GI ENDOSCOPY  02/13/2017  . URETEROSCOPY Left 09/19/2017   Procedure: URETEROSCOPY;  Surgeon: Hollice Espy, MD;  Location: [facility] ORS;  Service: Urology;  Laterality: Left;  Marland Kitchen VISCERAL ANGIOGRAPHY N/A 01/23/2019   Procedure: VISCERAL ANGIOGRAPHY;  Surgeon: Algernon Huxley, MD;  Location: [place] CV LAB;  Service: Cardiovascular;  Laterality: N/A;  . VISCERAL ANGIOGRAPHY N/A 06/09/2019   Procedure: VISCERAL ANGIOGRAPHY;  Surgeon: Algernon Huxley, MD;  Location: [place] CV LAB;  Service: Cardiovascular;  Laterality: N/A;  . VISCERAL ARTERY INTERVENTION N/A 02/12/2017   Procedure: VISCERAL ARTERY INTERVENTION;  Surgeon: Algernon Huxley, MD;  Location: [place] CV LAB;  Service: Cardiovascular;  Laterality: N/A;    Prior to Admission medications  Medication Sig Start Date End Date Taking? Authorizing Provider  albuterol (PROVENTIL HFA;VENTOLIN HFA) 108 (90 Base) MCG/ACT inhaler Inhale 2 puffs into the lungs every 6 (six) hours as needed for wheezing or shortness of breath. 10/22/17   Bettey Costa, MD  amLODipine (NORVASC) 10 MG tablet Take 1 tablet (10 mg total) by mouth daily. 08/23/19   [place], Suann Larry, MD  aspirin EC 81 MG tablet Take 1 tablet (81 mg total) by mouth daily. 01/23/19   Algernon Huxley, MD  cholecalciferol (VITAMIN D) 1000 units tablet Take 1,000 Units by mouth daily.    [provider]  clopidogrel (PLAVIX) 75 MG tablet Take 1 tablet (75 mg total) by mouth daily.  03/29/18   Einar Pheasant, MD  cyanocobalamin 1000 MCG tablet Take 2,000 mcg by mouth daily.    [provider]  fluticasone (FLONASE) 50 MCG/ACT nasal spray USE 2 SPRAYS INTO BOTH  NOSTRILS DAILY AS NEEDED  FOR ALLERGIES. Patient taking differently: Place 2 sprays into both nostrils daily.  10/01/18   Einar Pheasant, MD  gabapentin (NEURONTIN) 100 MG capsule Take 2 capsules by mouth at bedtime. 06/02/19   [provider]  hydrALAZINE (APRESOLINE) 50 MG tablet Take 50 mg by mouth 3 (three) times daily. (take with 173m tablets for 1541mtotal doses)    [provider]  ipratropium-albuterol (DUONEB) 0.5-2.5 (3) MG/3ML SOLN Take 3 mLs by nebulization every 6 (six) hours as needed (for shortness of breath). 11/14/14   GaJoanne GavelMD  labetalol (NORMODYNE) 200 MG tablet Take 1 tablet (200 mg total) by mouth 2 (two) times daily. 08/22/19   [place], ChSuann LarryMD  loperamide (IMODIUM) 2 MG capsule Take 1 capsule (2 mg total) by mouth as needed for diarrhea or loose stools. 08/22/19   [place], ChSuann LarryMD  losartan (COZAAR) 100 MG tablet Take 1 tablet (100 mg total) by mouth daily. 08/23/19   [place], ChSuann LarryMD  lovastatin (MEVACOR) 40 MG tablet TAKE 1 TABLET BY MOUTH  DAILY IN THE EVENING Patient taking differently: Take 40 mg by mouth every evening.  06/25/19   ScEinar PheasantMD  predniSONE (DELTASONE) 10 MG tablet Take 40 mg for 2 days then take 30 mg for 2 days then take 20 mg for 2 days then take 10 mg for 2 days then stop 08/22/19   [place], ChSuann LarryMD  roflumilast (DALIRESP) 500 MCG TABS tablet Take 0.5 tablets (250 mcg total) by mouth daily. 08/23/19   [place], ChSuann LarryMD  SYMBICORT 160-4.5 MCG/ACT inhaler USE 2 PUFFS BY MOUTH TWO  TIMES DAILY Patient taking differently: Inhale 2 puffs into the lungs in the morning and at bedtime.  06/30/19   ScEinar PheasantMD  torsemide (DEMADEX) 20 MG tablet Take 1 tablet (20 mg total) by mouth daily as  needed (swelling or shortness of breath). 08/06/19   GrEzekiel SlocumbDO    Allergies Evista [raloxifene] and Fluticasone-salmeterol  Family History  Problem Relation Age of Onset  . Cirrhosis Father        died age 668. Alcohol abuse Father   . Asthma Mother   . Congestive Heart Failure Mother   . Breast cancer Mother        2 (1/2 sisters)  . Osteoarthritis Mother   . Colon cancer Mother   . Lupus Sister   . Alcohol abuse Sister   . Ovarian cancer Sister   . Osteoporosis Sister   . Skin cancer Sister   .  Breast cancer Cousin   . Breast cancer Maternal Aunt     Social History Social History   Tobacco Use  . Smoking status: Former Smoker    Packs/day: 1.00    Years: 45.00    Pack years: 45.00    Types: Cigarettes    Quit date: 11/11/2003    Years since quitting: 15.8  . Smokeless tobacco: Never Used  Substance Use Topics  . Alcohol use: Not Currently    Alcohol/week: 0.0 standard drinks  . Drug use: No    Review of Systems  Constitutional: No fever/chills Eyes: No visual changes. ENT: No sore throat. Cardiovascular: Denies chest pain. Respiratory: Positive for shortness of breath. Gastrointestinal: No abdominal pain.  No nausea, no vomiting.  No diarrhea.  No constipation. Genitourinary: Negative for dysuria. Musculoskeletal: Negative for back pain. Skin: Negative for rash. Neurological: Negative for headaches, focal weakness or numbness.  ____________________________________________   PHYSICAL EXAM:  VITAL SIGNS: ED Triage Vitals [08/28/19 1301]  Enc Vitals Group     BP      Pulse      Resp      Temp      Temp src      SpO2      Weight 133 lb (60.3 kg)     Height '5\' 5"'  (1.651 m)     Head Circumference      Peak Flow      Pain Score 0     Pain Loc      Pain Edu?      Excl. in [place]?     Constitutional: Alert and oriented. Eyes: Conjunctivae are normal. Head: Atraumatic. Nose: No congestion/rhinnorhea. Mouth/Throat: Mucous membranes  are moist. Neck: Normal ROM Cardiovascular: Normal rate, irregularly irregular rhythm. Grossly normal heart sounds. Respiratory: Normal respiratory effort.  No retractions. Lungs CTAB. Gastrointestinal: Soft and nontender. No distention. Genitourinary: deferred Musculoskeletal: No lower extremity tenderness nor edema. Neurologic:  Normal speech and language. No gross focal neurologic deficits are appreciated. Skin:  Skin is warm, dry and intact. No rash noted. Psychiatric: Mood and affect are normal. Speech and behavior are normal.  ____________________________________________   LABS (all labs ordered are listed, but only abnormal results are displayed)  Labs Reviewed  CBC - Abnormal; Notable for the following components:      Result Value   WBC 12.2 (*)    All other components within normal limits  COMPREHENSIVE METABOLIC PANEL - Abnormal; Notable for the following components:   Glucose, Bld 139 (*)    BUN 42 (*)    Creatinine, Ser 1.81 (*)    Total Protein 6.1 (*)    AST 13 (*)    Total Bilirubin 1.3 (*)    GFR calc non Af Amer 26 (*)    GFR calc Af Amer 30 (*)    All other components within normal limits  TROPONIN I (HIGH SENSITIVITY) - Abnormal; Notable for the following components:   Troponin I (High Sensitivity) 27 (*)    All other components within normal limits  SARS CORONAVIRUS 2 (TAT 6-24 HRS)  BRAIN NATRIURETIC PEPTIDE  TROPONIN I (HIGH SENSITIVITY)   ____________________________________________  EKG  ED ECG REPORT I, Blake Divine, the attending physician, personally viewed and interpreted this ECG.   Date: 08/28/2019  EKG Time: 13:19  Rate: 83  Rhythm: atrial fibrillation, rate 83  Axis: Normal  Intervals:none  ST&T Change: None   PROCEDURES  Procedure(s) performed (including Critical Care):  .1-3 Lead EKG Interpretation Performed by: Charna Archer,  Juanda Crumble, MD Authorized by: Blake Divine, MD     Interpretation: normal     ECG rate:  81   ECG  rate assessment: normal     Rhythm: atrial fibrillation     Ectopy: none     Conduction: normal       ____________________________________________   INITIAL IMPRESSION / ASSESSMENT AND PLAN / ED COURSE       85 year old female with history of COPD, CHF, CAD, hypertension, and CKD presents to the ED for increased difficulty breathing after completing physical therapy with her oxygen off.  She has gradually felt better since her typical 2 L nasal cannula was replaced, now back to her baseline respiratory status with no respiratory distress.  Her O2 sats are maintained on the 2 L nasal cannula and she does not appear hypervolemic at this time.  There is no wheezing to suggest significant COPD exacerbation.  Her EKG does show atrial fibrillation with a controlled rate, patient reports a history of this and there are no ischemic changes.  Chest x-ray shows small pleural effusion and overall is much improved from patient's recent admission, where she required IV diuresis.  However, she does still have some findings of CHF and diuresis will be difficult with her AKI.  We will initially start with IV fluid bolus and she may likely need some additional diuresis from there.  Case was discussed with hospitalist for admission.      ____________________________________________   FINAL CLINICAL IMPRESSION(S) / ED DIAGNOSES  Final diagnoses:  AKI (acute kidney injury) ([place])  Shortness of breath     ED Discharge Orders    None       Note:  This document was prepared using Dragon voice recognition software and may include unintentional dictation errors.   Blake Divine, MD 08/28/19 [phone]

## 2019-08-28 NOTE — ED Notes (Signed)
Pt given warm blanket.

## 2019-08-28 NOTE — ED Notes (Signed)
Purple and green top sent to lab

## 2019-08-29 DIAGNOSIS — N184 Chronic kidney disease, stage 4 (severe): Secondary | ICD-10-CM | POA: Diagnosis not present

## 2019-08-29 DIAGNOSIS — Z20822 Contact with and (suspected) exposure to covid-19: Secondary | ICD-10-CM | POA: Diagnosis not present

## 2019-08-29 DIAGNOSIS — J441 Chronic obstructive pulmonary disease with (acute) exacerbation: Secondary | ICD-10-CM | POA: Diagnosis not present

## 2019-08-29 DIAGNOSIS — I4891 Unspecified atrial fibrillation: Secondary | ICD-10-CM | POA: Diagnosis not present

## 2019-08-29 DIAGNOSIS — R000 Tachycardia, unspecified: Secondary | ICD-10-CM | POA: Diagnosis not present

## 2019-08-29 DIAGNOSIS — R0602 Shortness of breath: Secondary | ICD-10-CM | POA: Diagnosis not present

## 2019-08-29 DIAGNOSIS — J9621 Acute and chronic respiratory failure with hypoxia: Secondary | ICD-10-CM | POA: Diagnosis not present

## 2019-08-29 DIAGNOSIS — I5033 Acute on chronic diastolic (congestive) heart failure: Secondary | ICD-10-CM | POA: Diagnosis not present

## 2019-08-29 DIAGNOSIS — N179 Acute kidney failure, unspecified: Secondary | ICD-10-CM | POA: Diagnosis not present

## 2019-08-29 DIAGNOSIS — I130 Hypertensive heart and chronic kidney disease with heart failure and stage 1 through stage 4 chronic kidney disease, or unspecified chronic kidney disease: Secondary | ICD-10-CM | POA: Diagnosis not present

## 2019-08-29 LAB — CBC
HCT: 35.6 % — ABNORMAL LOW (ref 36.0–46.0)
Hemoglobin: 11.9 g/dL — ABNORMAL LOW (ref 12.0–15.0)
MCH: 30.8 pg (ref 26.0–34.0)
MCHC: 33.4 g/dL (ref 30.0–36.0)
MCV: 92.2 fL (ref 80.0–100.0)
Platelets: 184 10*3/uL (ref 150–400)
RBC: 3.86 MIL/uL — ABNORMAL LOW (ref 3.87–5.11)
RDW: 13.8 % (ref 11.5–15.5)
WBC: 7.8 10*3/uL (ref 4.0–10.5)
nRBC: 0 % (ref 0.0–0.2)

## 2019-08-29 LAB — BASIC METABOLIC PANEL
Anion gap: 10 (ref 5–15)
BUN: 34 mg/dL — ABNORMAL HIGH (ref 8–23)
CO2: 28 mmol/L (ref 22–32)
Calcium: 9.2 mg/dL (ref 8.9–10.3)
Chloride: 102 mmol/L (ref 98–111)
Creatinine, Ser: 1.2 mg/dL — ABNORMAL HIGH (ref 0.44–1.00)
GFR calc Af Amer: 49 mL/min — ABNORMAL LOW (ref >60)
GFR calc non Af Amer: 42 mL/min — ABNORMAL LOW (ref >60)
Glucose, Bld: 107 mg/dL — ABNORMAL HIGH (ref 70–99)
Potassium: 4 mmol/L (ref 3.5–5.1)
Sodium: 140 mmol/L (ref 135–145)

## 2019-08-29 LAB — GLUCOSE, CAPILLARY: Glucose-Capillary: 119 mg/dL — ABNORMAL HIGH (ref 70–99)

## 2019-08-29 MED ORDER — ROFLUMILAST 500 MCG PO TABS: 250.0000 ug | ORAL_TABLET | Freq: Every day | ORAL | 0 refills | Status: AC

## 2019-08-29 NOTE — Evaluation (Signed)
Physical Therapy Evaluation Patient Details Name: Tina [name] MRN: [id number] DOB: 1934-09-06 Today's Date: 08/29/2019   History of Present Illness  Pt is an 85 yo female that presented to ED from [facility] (SNF) for SOB, admitted for acute renal failure. Recent hospital stay due to dyspnea on exertion, discharged to rehab 08/22/19. PMHx:  CHF, CAD, emphysema, HTN, COPD, CKD, PN, osteoporosis, bladder CA, lung nodules.     Clinical Impression  Pt alert, oriented, up in room with OT upon entering room. PLOF gathered from OT, pt did endorse 6 STE her home and use of rollator. Son and daughter will be able to assist patient.  The patient was able to ambulate ~148ft with RW, supervision. Seated rest break prior to stair navigation, minA for 6 steps, pt held onto rail with opposite hand. The patient exhibited some difficulty with eccentric control on the stairs but overall safe, no LOB noted. Patient ambulated with decreased step height/length, no LOB noted, mildly decreased gait velocity.  Overall the patient demonstrated mild deficits (see "PT Problem List") that impede the patient's functional abilities, safety, and mobility and would benefit from skilled PT intervention. Recommendation is HHPT with intermittent supervision.  SpO2 monitored throughout, 94% or greater on 3L via [place].      Follow Up Recommendations Home health PT;Supervision - Intermittent    Equipment Recommendations  None recommended by PT;Other (comment)(pt has RW and rollator)    Recommendations for Other Services       Precautions / Restrictions Precautions Precautions: Fall Precaution Comments: monitor O2 sats Restrictions Weight Bearing Restrictions: No      Mobility  Bed Mobility Overal bed mobility: Needs Assistance             General bed mobility comments: Pt up in room with OT  Transfers Overall transfer level: Needs assistance Equipment used: Rolling walker (2 wheeled) Transfers: Sit  to/from Stand Sit to Stand: Min guard         General transfer comment: cued for handplacement for safety, no physical assistance needed from EOB or from recliner  Ambulation/Gait Ambulation/Gait assistance: Supervision Gait Distance (Feet): 150 Feet Assistive device: Rolling walker (2 wheeled)       General Gait Details: Decreased gait velocity noted, tended to have RW outside BOS able to correct with cueing. Decreased step height/length, but no LOB noted.  Stairs Stairs: Yes Stairs assistance: Min assist Stair Management: One rail Right Number of Stairs: 6 General stair comments: handheld assist provided on side without railing. Slow, cautious, but no LOB noted  Wheelchair Mobility    Modified Rankin (Stroke Patients Only)       Balance Overall balance assessment: Needs assistance Sitting-balance support: Feet supported Sitting balance-Leahy Scale: Good     Standing balance support: During functional activity Standing balance-Leahy Scale: Fair Standing balance comment: Pt demonstrated 2 posterior LOBs while standing sink side to brush teeth - able to self correct                             Pertinent Vitals/Pain Pain Assessment: No/denies pain    Home Living Family/patient expects to be discharged to:: Private residence Living Arrangements: Children(Son and Dtr) Available Help at Discharge: Family;Available 24 hours/day Type of Home: House Home Access: Stairs to enter Entrance Stairs-Rails: None Entrance Stairs-Number of Steps: 1 Home Layout: Two level Home Equipment: Walker - 2 wheels;Walker - 4 wheels;Shower seat      Prior Function Level of  Independence: Needs assistance         Comments: Rollator for mobility in home. Pt came from [facility] and was working with PT.      Hand Dominance   Dominant Hand: Right    Extremity/Trunk Assessment   Upper Extremity Assessment Upper Extremity Assessment: Defer to OT  evaluation    Lower Extremity Assessment Lower Extremity Assessment: Generalized weakness    Cervical / Trunk Assessment Cervical / Trunk Assessment: Kyphotic  Communication   Communication: No difficulties  Cognition Arousal/Alertness: Awake/alert Behavior During Therapy: WFL for tasks assessed/performed Overall Cognitive Status: Within Functional Limits for tasks assessed                                        General Comments General comments (skin integrity, edema, etc.): Pt on 3L via [place] throughout session. spO2 >94%, HR 120s-low 130s after stair navigation, returned to low 100s with seated rest break quickly.    Exercises Other Exercises Other Exercises: Pt educted on PT role, POC, expectations of physical therapy Other Exercises: safe stair navigation with assistance of family member to ensure safety entering home at discharge   Assessment/Plan    PT Assessment Patient needs continued PT services  PT Problem List Decreased strength;Decreased range of motion;Decreased activity tolerance;Decreased balance;Decreased mobility;Decreased coordination;Decreased safety awareness;Cardiopulmonary status limiting activity       PT Treatment Interventions DME instruction;Gait training;Stair training;Functional mobility training;Therapeutic activities;Therapeutic exercise;Balance training;Neuromuscular re-education;Patient/family education    PT Goals (Current goals can be found in the Care Plan section)  Acute Rehab PT Goals Patient Stated Goal: to go home PT Goal Formulation: With patient Time For Goal Achievement: 09/12/19 Potential to Achieve Goals: Good    Frequency Min 2X/week   Barriers to discharge        Co-evaluation               AM-PAC PT "6 Clicks" Mobility  Outcome Measure Help needed turning from your back to your side while in a flat bed without using bedrails?: A Little Help needed moving from lying on your back to sitting on the  side of a flat bed without using bedrails?: A Little Help needed moving to and from a bed to a chair (including a wheelchair)?: A Little Help needed standing up from a chair using your arms (e.g., wheelchair or bedside chair)?: None Help needed to walk in hospital room?: None Help needed climbing 3-5 steps with a railing? : A Little 6 Click Score: 20    End of Session Equipment Utilized During Treatment: Gait belt;Oxygen(3L) Activity Tolerance: Patient tolerated treatment well;Treatment limited secondary to medical complications (Comment) Patient left: in chair;with call bell/phone within reach;with nursing/sitter in room Nurse Communication: Mobility status PT Visit Diagnosis: Unsteadiness on feet (R26.81);Muscle weakness (generalized) (M62.81);Difficulty in walking, not elsewhere classified (R26.2);Ataxic gait (R26.0)    Time: 9201-0071 PT Time Calculation (min) (ACUTE ONLY): 19 min   Charges:   PT Evaluation $PT Eval Low Complexity: 1 Low PT Treatments $Therapeutic Exercise: 8-22 mins        Lieutenant Diego PT, DPT 10:38 AM,08/29/19

## 2019-08-29 NOTE — TOC Initial Note (Signed)
Transition of Care [facility]) - Initial/Assessment Note    Patient Details  Name: Tina [name] MRN: [id number] Date of Birth: 20-May-1934  Transition of Care [facility]) CM/SW Contact:    Beverly Sessions, RN Phone Number: 08/29/2019, 1:20 PM  Clinical Narrative:                 Patient admitted from [facility] with Acute renal failure  Patient does not wish to return to [facility] at discharge.  [place] at [employer].   Patient to return home with home health services.  Patient states that she would like [place]  Patient states that she lives in her own home and her son lives there.    PCP Scott - at baseline patient drives herself.    Patient uses mail order pharmacy.  It needed patient uses [employer].  Patient has walker, WC, and BSC in the home   Patient is adamant that she needs O2 for discharge.  Patient ambulated 500 feet on RA and maintained sats of 94%.  Patient does not qualify for home O2.  Disscussed private pay O2 and patient declined.  MD updated  Leroy Sea with [employer] to check with [place] to determine if they have any documentation that would qualify patient for O2  Referral made to [facility] with [place] and aware of discharge   Expected Discharge Plan: [place] Barriers to Discharge: No Barriers Identified   Patient Goals and CMS Choice        Expected Discharge Plan and Services Expected Discharge Plan: [place] arrangements for the past 2 months: (Came from [facility], not returning) Expected Discharge Date: 08/29/19                         HH Arranged: PT, OT [place] Agency: [facility] ([place]) Date [place]: 08/29/19   Representative spoke with at [place]: Corene Cornea  Prior Living Arrangements/Services Living arrangements for the past 2 months: (Came from [facility], not returning)   Patient  language and need for interpreter reviewed:: Yes        Need for Family Participation in Patient Care: Yes (Comment) Care giver support system in place?: Yes (comment) Current home services: DME    Activities of Daily Living Home Assistive Devices/Equipment: Environmental consultant (specify type), Eyeglasses ADL Screening (condition at time of admission) Patient's cognitive ability adequate to safely complete daily activities?: Yes Is the patient deaf or have difficulty hearing?: No Does the patient have difficulty seeing, even when wearing glasses/contacts?: No Does the patient have difficulty concentrating, remembering, or making decisions?: No Patient able to express need for assistance with ADLs?: Yes Does the patient have difficulty dressing or bathing?: No Independently performs ADLs?: Yes (appropriate for developmental age) Does the patient have difficulty walking or climbing stairs?: No Weakness of Legs: Both Weakness of Arms/Hands: None  Permission Sought/Granted                  Emotional Assessment Appearance:: Appears stated age     Orientation: : Oriented to Self, Oriented to Place, Oriented to  Time, Oriented to Situation      Admission diagnosis:  Shortness of breath [R06.02] Acute renal failure (ARF) (HCC) [N17.9] AKI (acute kidney injury) ([place]) [N17.9] Patient Active Problem List   Diagnosis Date Noted  . Acute renal failure (  ARF) ([place]) 08/28/2019  . Palliative care by specialist   . DNR (do not resuscitate) discussion   . Pleural effusion, left 08/17/2019  . Peripheral neuropathy 08/17/2019  . History of pulmonary embolism 08/17/2019  . Dyspnea 08/13/2019  . COPD with acute exacerbation ([place]) 08/02/2019  . Acute on chronic diastolic CHF (congestive heart failure) ([place]) 08/02/2019  . Abnormality of gait 08/02/2019  . Renal artery stenosis ([place]) 07/01/2019  . DOE (dyspnea on exertion) 05/25/2019  . Diarrhea 05/10/2019  . AKI (acute kidney injury) ([place])   .  Hypokalemia   . Foot drop 04/02/2019  . Low back pain 03/16/2019  . Leg pain 03/16/2019  . Aortic atherosclerosis ([place]) 01/07/2019  . Hoarseness 06/23/2018  . Unsteadiness 05/11/2018  . CHF (congestive heart failure) ([place]) 10/20/2017  . Pulmonary embolus ([place]) 06/28/2017  . 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 Goals of care, counseling/discussion 03/02/2017  . Abnormal computed tomography of gastrointestinal tract   . Noninfectious diarrhea   . Ulceration of intestine   . Hernia, hiatal   . Bladder mass   . Mesenteric ischemia due to arterial insufficiency ([place])   . Polycythemia vera ([place]) 10/21/2016  . Elevated troponin 06/14/2016  . CKD (chronic kidney disease), stage III ([place]) 06/14/2016  . Leukocytosis 06/14/2016  . Abdominal bruit 04/16/2016  . Abnormal mammogram 06/06/2015  . Essential hypertension 02/16/2015  . Numbness 01/24/2015  . Personal history of tobacco use, presenting hazards to health 11/25/2014  . BRCA positive   . COPD exacerbation ([place]) 11/14/2014  . Sensation of pressure in bladder area 08/15/2014  . Health care maintenance 08/15/2014  . Presence of coronary angioplasty implant and graft 09/30/2013  . History of colonic polyps 08/25/2012  . Female genuine stress incontinence 06/28/2012  . Infection of urinary tract 06/28/2012  . CAD (coronary artery disease) 05/23/2012  . COPD, severe ([place]) 05/23/2012  . Hypercholesterolemia 05/23/2012  . Hyperglycemia 05/23/2012  . Polycythemia, secondary 05/23/2012  . Renal cyst 05/23/2012  . Osteoporosis 05/23/2012   PCP:  Einar Pheasant, MD Pharmacy:   [place], [place] [place] [address] Phone: [phone] Fax: [place], [place], [place] [address] Phone: [phone] Fax: [place], [place] - [address] [place] [address] Phone: [phone] Fax: [phone]     Social Determinants of Health ([place]) Interventions    Readmission Risk Interventions Readmission Risk Prevention Plan 08/15/2019 08/14/2019  Transportation Screening - Complete  PCP or Specialist Appt within 3-5 Days - Complete  HRI or Home Care Consult Complete (No Data)  Social Work Consult for [place] Planning/Counseling - Complete  Palliative Care Screening - Not Applicable  Medication Review Press photographer) - Complete  Some recent data might be hidden

## 2019-08-29 NOTE — Progress Notes (Signed)
Tina [name] to be D/C'd home per MD order.  Discussed prescriptions and follow up appointments with the patient. Prescriptions given to patient, medication list explained in detail. Pt verbalized understanding.  Allergies as of 08/29/2019      Reactions   Evista [raloxifene] Other (See Comments)   Night sweats    Fluticasone-salmeterol Other (See Comments)   Cough, "chokes me"       Medication List    STOP taking these medications   predniSONE 10 MG tablet Commonly known as: DELTASONE     TAKE these medications   albuterol 108 (90 Base) MCG/ACT inhaler Commonly known as: VENTOLIN HFA Inhale 2 puffs into the lungs every 6 (six) hours as needed for wheezing or shortness of breath.   amLODipine 10 MG tablet Commonly known as: NORVASC Take 1 tablet (10 mg total) by mouth daily.   aspirin EC 81 MG tablet Take 1 tablet (81 mg total) by mouth daily.   cholecalciferol 1000 units tablet Commonly known as: VITAMIN D Take 1,000 Units by mouth daily.   clopidogrel 75 MG tablet Commonly known as: PLAVIX Take 1 tablet (75 mg total) by mouth daily.   cyanocobalamin 1000 MCG tablet Take 1,000 mcg by mouth daily.   fluticasone 50 MCG/ACT nasal spray Commonly known as: FLONASE USE 2 SPRAYS INTO BOTH  NOSTRILS DAILY AS NEEDED  FOR ALLERGIES. What changed: See the new instructions.   gabapentin 100 MG capsule Commonly known as: NEURONTIN Take 200 mg by mouth at bedtime.   hydrALAZINE 50 MG tablet Commonly known as: APRESOLINE Take 50 mg by mouth 3 (three) times daily.   ipratropium-albuterol 0.5-2.5 (3) MG/3ML Soln Commonly known as: DUONEB Take 3 mLs by nebulization every 6 (six) hours as needed (for shortness of breath).   labetalol 200 MG tablet Commonly known as: NORMODYNE Take 1 tablet (200 mg total) by mouth 2 (two) times daily. What changed: when to take this   loperamide 2 MG capsule Commonly known as: IMODIUM Take 1 capsule (2 mg total) by mouth as needed for  diarrhea or loose stools.   losartan 100 MG tablet Commonly known as: COZAAR Take 1 tablet (100 mg total) by mouth daily.   lovastatin 40 MG tablet Commonly known as: MEVACOR TAKE 1 TABLET BY MOUTH  DAILY IN THE EVENING   roflumilast 500 MCG Tabs tablet Commonly known as: DALIRESP Take 0.5 tablets (250 mcg total) by mouth daily.   Symbicort 160-4.5 MCG/ACT inhaler Generic drug: budesonide-formoterol USE 2 PUFFS BY MOUTH TWO  TIMES DAILY What changed: See the new instructions.   torsemide 20 MG tablet Commonly known as: DEMADEX Take 1 tablet (20 mg total) by mouth daily as needed (swelling or shortness of breath).       Vitals:   08/29/19 0812 08/29/19 1212  BP: (!) 147/92 (!) 160/87  Pulse: 90 (!) 105  Resp: 20 20  Temp: 98.4 F (36.9 C) 97.9 F (36.6 C)  SpO2: 96% 97%    Skin clean, dry and intact without evidence of skin break down, no evidence of skin tears noted. IV catheter discontinued intact. Site without signs and symptoms of complications. Dressing and pressure applied. Pt denies pain at this time. No complaints noted.  An After Visit Summary was printed and given to the patient. Patient escorted via [place], and D/C home via private auto.  Fuller Mandril, RN

## 2019-08-29 NOTE — Discharge Instructions (Signed)
Chronic Obstructive Pulmonary Disease Chronic obstructive pulmonary disease (COPD) is a long-term (chronic) lung problem. When you have COPD, it is hard for air to get in and out of your lungs. Usually the condition gets worse over time, and your lungs will never return to normal. There are things you can do to keep yourself as healthy as possible.  Your doctor may treat your condition with: ? Medicines. ? Oxygen. ? Lung surgery.  Your doctor may also recommend: ? Rehabilitation. This includes steps to make your body work better. It may involve a team of specialists. ? Quitting smoking, if you smoke. ? Exercise and changes to your diet. ? Comfort measures (palliative care). Follow these instructions at home: Medicines  Take over-the-counter and prescription medicines only as told by your doctor.  Talk to your doctor before taking any cough or allergy medicines. You may need to avoid medicines that cause your lungs to be dry. Lifestyle  If you smoke, stop. Smoking makes the problem worse. If you need help quitting, ask your doctor.  Avoid being around things that make your breathing worse. This may include smoke, chemicals, and fumes.  Stay active, but remember to rest as well.  Learn and use tips on how to relax.  Make sure you get enough sleep. Most adults need at least 7 hours of sleep every night.  Eat healthy foods. Eat smaller meals more often. Rest before meals. Controlled breathing Learn and use tips on how to control your breathing as told by your doctor. Try:  Breathing in (inhaling) through your nose for 1 second. Then, pucker your lips and breath out (exhale) through your lips for 2 seconds.  Putting one hand on your belly (abdomen). Breathe in slowly through your nose for 1 second. Your hand on your belly should move out. Pucker your lips and breathe out slowly through your lips. Your hand on your belly should move in as you breathe out.  Controlled coughing Learn  and use controlled coughing to clear mucus from your lungs. Follow these steps: 1. Lean your head a little forward. 2. Breathe in deeply. 3. Try to hold your breath for 3 seconds. 4. Keep your mouth slightly open while coughing 2 times. 5. Spit any mucus out into a tissue. 6. Rest and do the steps again 1 or 2 times as needed. General instructions  Make sure you get all the shots (vaccines) that your doctor recommends. Ask your doctor about a flu shot and a pneumonia shot.  Use oxygen therapy and pulmonary rehabilitation if told by your doctor. If you need home oxygen therapy, ask your doctor if you should buy a tool to measure your oxygen level (oximeter).  Make a COPD action plan with your doctor. This helps you to know what to do if you feel worse than usual.  Manage any other conditions you have as told by your doctor.  Avoid going outside when it is very hot, cold, or humid.  Avoid people who have a sickness you can catch (contagious).  Keep all follow-up visits as told by your doctor. This is important. Contact a doctor if:  You cough up more mucus than usual.  There is a change in the color or thickness of the mucus.  It is harder to breathe than usual.  Your breathing is faster than usual.  You have trouble sleeping.  You need to use your medicines more often than usual.  You have trouble doing your normal activities such as getting dressed   or walking around the house. Get help right away if:  You have shortness of breath while resting.  You have shortness of breath that stops you from: ? Being able to talk. ? Doing normal activities.  Your chest hurts for longer than 5 minutes.  Your skin color is more blue than usual.  Your pulse oximeter shows that you have low oxygen for longer than 5 minutes.  You have a fever.  You feel too tired to breathe normally. Summary  Chronic obstructive pulmonary disease (COPD) is a long-term lung problem.  The way your  lungs work will never return to normal. Usually the condition gets worse over time. There are things you can do to keep yourself as healthy as possible.  Take over-the-counter and prescription medicines only as told by your doctor.  If you smoke, stop. Smoking makes the problem worse. This information is not intended to replace advice given to you by your health care provider. Make sure you discuss any questions you have with your health care provider. Document Revised: 04/13/2017 Document Reviewed: 06/05/2016 Elsevier Patient Education  2020 Elsevier Inc.  

## 2019-08-29 NOTE — Care Management Obs Status (Signed)
[place] NOTIFICATION   Patient Details  Name: Tina [name] MRN: [id number] Date of Birth: 05/03/1935   Medicare Observation Status Notification Given:  No(admitted obs less than 24 hours)    Beverly Sessions, RN 08/29/2019, 1:28 PM

## 2019-08-29 NOTE — Progress Notes (Signed)
Manufacturing engineer [facility]) Hospital Liaison RN note  This is a current community based palliative care patient followed by [employer].  Please call with any questions.  Thank you.  Margaretmary Eddy, BSN, RN [facility] Liaison [phone]

## 2019-08-29 NOTE — Progress Notes (Signed)
SATURATION QUALIFICATIONS: (This note is used to comply with regulatory documentation for home oxygen)  Patient Saturations on Room Air at Rest = 98%  Patient Saturations on Room Air while Ambulating = 94%  Please briefly explain why patient needs home oxygen: COPD

## 2019-08-29 NOTE — Progress Notes (Signed)
Palliative:  Consult received and chart reviewed. Patient is discharging home today with home health. Recommend outpatient palliative to see following discharge.   Juel Burrow, DNP, AGNP-C Palliative Medicine Team Team Phone # [phone]  Pager # [phone]  NO CHARGE

## 2019-08-29 NOTE — Evaluation (Addendum)
Occupational Therapy Evaluation Patient Details Name: Tina [name] MRN: [id number] DOB: May 02, 1935 Today's Date: 08/29/2019    History of Present Illness Pt is an 85 yo female that presented to ED from [facility] (SNF) for SOB, admitted for acute renal failure. Recent hospital stay due to dyspnea on exertion, discharged to rehab 08/22/19. PMHx:  CHF, CAD, emphysema, HTN, COPD, CKD, PN, osteoporosis, bladder CA, lung nodules.    Clinical Impression   Tina [name] was seen for OT evaluation this date. Prior to hospital admission, pt was living at [facility], pt was recently d/c from hospital on 08/22/19. Pt presents to acute OT demonstrating impaired ADL performance and functional mobility 2/2 decreased LB access, functional strength/endurance/balance deficits, and decreased safety awareness. Pt currently requires CGA + RW for standing grooming tasks - pt fatigues easily but improved activity tolerance c 3L [place]. SETUP only for seated grooming, self-feeding, and UBD. Pt lives c son who is home full time and reports that her daughter will also be assisting her as needed. Pt would benefit from skilled OT to address noted impairments and functional limitations (see below for any additional details) in order to maximize safety and independence while minimizing falls risk and caregiver burden. Upon hospital discharge, recommend HHOT to maximize pt safety and return to functional independence during meaningful occupations of daily life.     Follow Up Recommendations  Home health OT;Supervision/Assistance - 24 hour    Equipment Recommendations  3 in 1 bedside commode    Recommendations for Other Services       Precautions / Restrictions Precautions Precautions: Fall Restrictions Weight Bearing Restrictions: No      Mobility Bed Mobility Overal bed mobility: Needs Assistance             General bed mobility comments: Pt received sitting on toilet and left up in room c  PT  Transfers Overall transfer level: Needs assistance Equipment used: Rolling walker (2 wheeled);1 person hand held assist Transfers: Sit to/from Stand Sit to Stand: Min assist;From elevated surface         General transfer comment: MIN A + 1HHA sit>stand at standard commode. CGA + RW sit<>stand at bed c elevated surface     Balance Overall balance assessment: Needs assistance Sitting-balance support: Feet supported Sitting balance-Leahy Scale: Good     Standing balance support: Single extremity supported;During functional activity Standing balance-Leahy Scale: Fair Standing balance comment: Pt demonstrated 2 posterior LOBs while standing sink side to brush teeth - able to self correct                           ADL either performed or assessed with clinical judgement   ADL Overall ADL's : Needs assistance/impaired                                       General ADL Comments: CGA toileting at standard commode including briefs management. SBA + RW hand washing and tooth brushing standing sink side. SETUP simulated UBD.      Vision Baseline Vision/History: Wears glasses Wears Glasses: Reading only       Perception     Praxis      Pertinent Vitals/Pain Pain Assessment: No/denies pain     Hand Dominance Right   Extremity/Trunk Assessment Upper Extremity Assessment Upper Extremity Assessment: Overall WFL for tasks assessed   Lower Extremity  Assessment Lower Extremity Assessment: Generalized weakness   Cervical / Trunk Assessment Cervical / Trunk Assessment: Kyphotic   Communication Communication Communication: No difficulties   Cognition Arousal/Alertness: Awake/alert Behavior During Therapy: WFL for tasks assessed/performed Overall Cognitive Status: Within Functional Limits for tasks assessed                                     General Comments  SpO2 93% on room air after toilet t/f. SpO2 95% on 2L [place] seated EOB -  pt reporting still feels SOB. SpO2 97% on 3L [place] seated EOB and standing sink side    Exercises Exercises: Other exercises Other Exercises Other Exercises: Pt educated re: falls prevention, energy conservation, DME recommendations, d/c recommendations Other Exercises: Toileting, hand washing, tooth brushing, sit<>stand x2   Shoulder Instructions      Home Living Family/patient expects to be discharged to:: Private residence Living Arrangements: Children(Son and Dtr) Available Help at Discharge: Family;Available 24 hours/day Type of Home: House Home Access: Stairs to enter [employer] of Steps: 1 Entrance Stairs-Rails: None Home Layout: Two level Alternate Level Stairs-Number of Steps: 6 steps to bedroom and 7 steps to laundry with R railing Alternate Level Stairs-Rails: Right Bathroom Shower/Tub: Occupational psychologist: Standard     Home Equipment: Environmental consultant - 2 wheels;Walker - 4 wheels;Shower seat          Prior Functioning/Environment Level of Independence: Needs assistance        Comments: Rollator for mobility in home. Pt came from [facility] and was working with PT.         OT Problem List: Decreased strength;Decreased activity tolerance;Impaired balance (sitting and/or standing);Decreased knowledge of use of DME or AE      OT Treatment/Interventions: Self-care/ADL training;Therapeutic exercise;Energy conservation;DME and/or AE instruction;Therapeutic activities;Visual/perceptual remediation/compensation;Patient/family education;Balance training    OT Goals(Current goals can be found in the care plan section) Acute Rehab OT Goals Patient Stated Goal: go home  OT Goal Formulation: With patient Time For Goal Achievement: 09/12/19 Potential to Achieve Goals: Good ADL Goals Pt Will Perform Grooming: with supervision;standing Pt Will Transfer to Toilet: with supervision;ambulating;regular height toilet(c LRAD PRN) Pt Will Perform  Toileting - Clothing Manipulation and hygiene: with supervision;sit to/from stand(c LRAD PRN) Additional ADL Goal #1: Pt will independently verbalize plan to implement x3 energy conservation strategies  OT Frequency: Min 2X/week   Barriers to D/C: Inaccessible home environment;Decreased caregiver support          Co-evaluation              AM-PAC OT "6 Clicks" Daily Activity     Outcome Measure Help from another person eating meals?: A Little Help from another person taking care of personal grooming?: A Little Help from another person toileting, which includes using toliet, bedpan, or urinal?: A Little Help from another person bathing (including washing, rinsing, drying)?: A Little Help from another person to put on and taking off regular upper body clothing?: A Little Help from another person to put on and taking off regular lower body clothing?: A Lot 6 Click Score: 17   End of Session Equipment Utilized During Treatment: Rolling walker;Oxygen(3L [place])  Activity Tolerance: Patient tolerated treatment well Patient left: Other (comment)(PT in room at end of session)  OT Visit Diagnosis: Unsteadiness on feet (R26.81);Other abnormalities of gait and mobility (R26.89)  Time: 7218-2883 OT Time Calculation (min): 15 min Charges:  OT General Charges $OT Visit: 1 Visit OT Evaluation $OT Eval Low Complexity: 1 Low OT Treatments $Self Care/Home Management : 8-22 mins  Dessie Coma, M.S. OTR/L  08/29/19, 10:02 AM

## 2019-08-30 NOTE — Discharge Summary (Signed)
[place] [facility] [place] NAME: Tina [name]    MR#:  [id number]  DATE OF BIRTH:  [birth year]  DATE OF ADMISSION:  08/28/2019   ADMITTING PHYSICIAN: Max Sane, MD  DATE OF DISCHARGE: 08/29/2019  6:10 PM  PRIMARY CARE PHYSICIAN: Einar Pheasant, MD   ADMISSION DIAGNOSIS:  Shortness of breath [R06.02] Acute renal failure (ARF) ([facility]) [N17.9] AKI (acute kidney injury) ([place]) [N17.9] DISCHARGE DIAGNOSIS:  Active Problems:   Acute renal failure (ARF) ([place])  SECONDARY DIAGNOSIS:   Past Medical History:  Diagnosis Date  . Anemia   . Asthma   . Bladder cancer ([place])   . BRCA positive   . CAD (coronary artery disease)   . CHF (congestive heart failure) ([place])   . COPD (chronic obstructive pulmonary disease) ([place])   . Emphysema lung ([place])   . GERD (gastroesophageal reflux disease)   . History of colon polyps   . Hypercholesterolemia   . Hyperglycemia   . Hypertension   . Lung nodules   . Osteoporosis   . Personal history of tobacco use, presenting hazards to health 11/25/2014  . Polycythemia vera(238.4)   . Renal cyst   . Skin cancer    HOSPITAL COURSE:  Tina [name] a 85 y.o.Fwith COPD, HTN, dCHF< CAD, CKD IIIb whopresented with progressive dyspnea on exertion.  Labs show worsening renal function.  She was admitted for acute on chronic kidney disease stage IIIb. Third readmission in last 17-month  Last admission was from 08/13/2019 to 08/22/2019  Acute on CKD stage IIIb -Improved with hydration and creatinine at baseline.  COPD - Chronic hypoxic respiratory failure Stable.  Uses 2 L oxygen via nasal cannula chronically  chronic diastolic CHF Mild left pleural effusion Recent echocardiogram last month showed EF 50 to 55% with grade 1 diastolic dysfunction.   History of pulmonary embolism Completed 6 months of anticoagulant  Hypotension with a history of hypertension Coronary artery disease, secondary prevention Peripheral vascular  disease with stent placement SMA artery January 2021 Continue aspirin, Plavix, pravastatin.   Peripheral neuropathy Continue gabapentin  Severe protein calorie malnutrition As evidenced by chronic lung disease, poor oral intake, and severe depletion of subcutaneous muscle mass and fat, BMI 22.  Weight loss Unintentional  Overall poor prognosis.  Patient is agreeable to have palliative care follow her outpatient consideration for transition to hospice if and when appropriate.  DISCHARGE CONDITIONS:  Fair CONSULTS OBTAINED:   DRUG ALLERGIES:   Allergies  Allergen Reactions  . Evista [Raloxifene] Other (See Comments)    Night sweats   . Fluticasone-Salmeterol Other (See Comments)    Cough, "chokes me"    DISCHARGE MEDICATIONS:   Allergies as of 08/29/2019      Reactions   Evista [raloxifene] Other (See Comments)   Night sweats    Fluticasone-salmeterol Other (See Comments)   Cough, "chokes me"       Medication List    STOP taking these medications   predniSONE 10 MG tablet Commonly known as: DELTASONE     TAKE these medications   albuterol 108 (90 Base) MCG/ACT inhaler Commonly known as: VENTOLIN HFA Inhale 2 puffs into the lungs every 6 (six) hours as needed for wheezing or shortness of breath.   amLODipine 10 MG tablet Commonly known as: NORVASC Take 1 tablet (10 mg total) by mouth daily.   aspirin EC 81 MG tablet Take 1 tablet (81 mg total) by mouth daily.   cholecalciferol 1000  units tablet Commonly known as: VITAMIN D Take 1,000 Units by mouth daily.   clopidogrel 75 MG tablet Commonly known as: PLAVIX Take 1 tablet (75 mg total) by mouth daily.   cyanocobalamin 1000 MCG tablet Take 1,000 mcg by mouth daily.   fluticasone 50 MCG/ACT nasal spray Commonly known as: FLONASE USE 2 SPRAYS INTO BOTH  NOSTRILS DAILY AS NEEDED  FOR ALLERGIES. What changed: See the new instructions.   gabapentin 100 MG capsule Commonly known as: NEURONTIN Take 200  mg by mouth at bedtime.   hydrALAZINE 50 MG tablet Commonly known as: APRESOLINE Take 50 mg by mouth 3 (three) times daily.   ipratropium-albuterol 0.5-2.5 (3) MG/3ML Soln Commonly known as: DUONEB Take 3 mLs by nebulization every 6 (six) hours as needed (for shortness of breath).   labetalol 200 MG tablet Commonly known as: NORMODYNE Take 1 tablet (200 mg total) by mouth 2 (two) times daily. What changed: when to take this   loperamide 2 MG capsule Commonly known as: IMODIUM Take 1 capsule (2 mg total) by mouth as needed for diarrhea or loose stools.   losartan 100 MG tablet Commonly known as: COZAAR Take 1 tablet (100 mg total) by mouth daily.   lovastatin 40 MG tablet Commonly known as: MEVACOR TAKE 1 TABLET BY MOUTH  DAILY IN THE EVENING   roflumilast 500 MCG Tabs tablet Commonly known as: DALIRESP Take 0.5 tablets (250 mcg total) by mouth daily.   Symbicort 160-4.5 MCG/ACT inhaler Generic drug: budesonide-formoterol USE 2 PUFFS BY MOUTH TWO  TIMES DAILY What changed: See the new instructions.   torsemide 20 MG tablet Commonly known as: DEMADEX Take 1 tablet (20 mg total) by mouth daily as needed (swelling or shortness of breath).      DISCHARGE INSTRUCTIONS:  Patient is adamant that she needs O2 for discharge.  Patient ambulated 500 feet on RA and maintained sats of 94%.  Patient does not qualify for home O2 DIET:  Cardiac diet DISCHARGE CONDITION:  Stable ACTIVITY:  Activity as tolerated OXYGEN:  Home Oxygen: No. DISCHARGE LOCATION:  home with home health PT/OT and palliative care to follow  If you experience worsening of your admission symptoms, develop shortness of breath, life threatening emergency, suicidal or homicidal thoughts you must seek medical attention immediately by calling 911 or calling your MD immediately  if symptoms less severe.  You Must read complete instructions/literature along with all the possible adverse reactions/side effects  for all the Medicines you take and that have been prescribed to you. Take any new Medicines after you have completely understood and accpet all the possible adverse reactions/side effects.   Please note  You were cared for by a hospitalist during your hospital stay. If you have any questions about your discharge medications or the care you received while you were in the hospital after you are discharged, you can call the unit and asked to speak with the hospitalist on call if the hospitalist that took care of you is not available. Once you are discharged, your primary care physician will handle any further medical issues. Please note that NO REFILLS for any discharge medications will be authorized once you are discharged, as it is imperative that you return to your primary care physician (or establish a relationship with a primary care physician if you do not have one) for your aftercare needs so that they can reassess your need for medications and monitor your lab values.    On the day of Discharge:  VITAL SIGNS:  Blood pressure (!) 160/87, pulse (!) 105, temperature 97.9 F (36.6 C), temperature source Oral, resp. rate 20, height '5\' 5"'  (1.651 m), weight 60.3 kg, SpO2 97 %. PHYSICAL EXAMINATION:  GENERAL:  85 y.o.-year-old patient lying in the bed with no acute distress.  EYES: Pupils equal, round, reactive to light and accommodation. No scleral icterus. Extraocular muscles intact.  HEENT: Head atraumatic, normocephalic. Oropharynx and nasopharynx clear.  NECK:  Supple, no jugular venous distention. No thyroid enlargement, no tenderness.  LUNGS: Normal breath sounds bilaterally, no wheezing, rales,rhonchi or crepitation. No use of accessory muscles of respiration.  CARDIOVASCULAR: S1, S2 normal. No murmurs, rubs, or gallops.  ABDOMEN: Soft, non-tender, non-distended. Bowel sounds present. No organomegaly or mass.  EXTREMITIES: No pedal edema, cyanosis, or clubbing.  NEUROLOGIC: Cranial nerves  II through XII are intact. Muscle strength 5/5 in all extremities. Sensation intact. Gait not checked.  PSYCHIATRIC: The patient is alert and oriented x 3.  SKIN: No obvious rash, lesion, or ulcer.  DATA REVIEW:   CBC Recent Labs  Lab 08/29/19 0442  WBC 7.8  HGB 11.9*  HCT 35.6*  PLT 184    Chemistries  Recent Labs  Lab 08/28/19 1304 08/28/19 1304 08/29/19 0442  NA 136   < > 140  K 4.7   < > 4.0  CL 98   < > 102  CO2 27   < > 28  GLUCOSE 139*   < > 107*  BUN 42*   < > 34*  CREATININE 1.81*   < > 1.20*  CALCIUM 9.2   < > 9.2  AST 13*  --   --   ALT 15  --   --   ALKPHOS 56  --   --   BILITOT 1.3*  --   --    < > = values in this interval not displayed.     Outpatient follow-up Follow-up Information    Einar Pheasant, MD. Schedule an appointment as soon as possible for a visit in 1 week(s).   Specialty: Internal Medicine Contact information: [address] [place] [address] [phone]            Management plans discussed with the patient, family and they are in agreement.  CODE STATUS: Prior   TOTAL TIME TAKING CARE OF THIS PATIENT: 35 minutes.    Max Sane M.D on 08/30/2019 at 3:47 PM  [facility]   CC: Primary care physician; Einar Pheasant, MD   Note: This dictation was prepared with Dragon dictation along with smaller phrase technology. Any transcriptional errors that result from this process are unintentional.

## 2019-08-30 NOTE — Progress Notes (Signed)
Per MD there is no alternative to medication and advised patient to follow up with PCP.  Attempted to reach patient via phone. No answer.   Fuller Mandril, RN

## 2019-08-30 NOTE — Progress Notes (Signed)
Renee from [facility] called in reference to the patient's medication Roflumilast. Per Joseph Art the patient's insurance will not cover the medication and the out of pocket price will be $300.00 for twenty pills. Also, per Renee there is not a generic for this medication.   Fuller Mandril, RN

## 2019-09-01 ENCOUNTER — Inpatient Hospital Stay
Admission: EM | Admit: 2019-09-01 | Discharge: 2019-09-13 | DRG: 291 | Disposition: E | Payer: Medicare Other | Attending: Internal Medicine | Admitting: Internal Medicine

## 2019-09-01 ENCOUNTER — Emergency Department: Payer: Medicare Other

## 2019-09-01 ENCOUNTER — Other Ambulatory Visit: Payer: Self-pay

## 2019-09-01 DIAGNOSIS — Z7982 Long term (current) use of aspirin: Secondary | ICD-10-CM

## 2019-09-01 DIAGNOSIS — Z79899 Other long term (current) drug therapy: Secondary | ICD-10-CM

## 2019-09-01 DIAGNOSIS — R0789 Other chest pain: Secondary | ICD-10-CM | POA: Diagnosis not present

## 2019-09-01 DIAGNOSIS — Z6822 Body mass index (BMI) 22.0-22.9, adult: Secondary | ICD-10-CM

## 2019-09-01 DIAGNOSIS — Z85828 Personal history of other malignant neoplasm of skin: Secondary | ICD-10-CM | POA: Diagnosis not present

## 2019-09-01 DIAGNOSIS — R627 Adult failure to thrive: Secondary | ICD-10-CM | POA: Diagnosis present

## 2019-09-01 DIAGNOSIS — M810 Age-related osteoporosis without current pathological fracture: Secondary | ICD-10-CM | POA: Diagnosis present

## 2019-09-01 DIAGNOSIS — Z951 Presence of aortocoronary bypass graft: Secondary | ICD-10-CM

## 2019-09-01 DIAGNOSIS — E875 Hyperkalemia: Secondary | ICD-10-CM | POA: Diagnosis not present

## 2019-09-01 DIAGNOSIS — Z8041 Family history of malignant neoplasm of ovary: Secondary | ICD-10-CM | POA: Diagnosis not present

## 2019-09-01 DIAGNOSIS — Z803 Family history of malignant neoplasm of breast: Secondary | ICD-10-CM

## 2019-09-01 DIAGNOSIS — Z8551 Personal history of malignant neoplasm of bladder: Secondary | ICD-10-CM | POA: Diagnosis not present

## 2019-09-01 DIAGNOSIS — I2510 Atherosclerotic heart disease of native coronary artery without angina pectoris: Secondary | ICD-10-CM | POA: Diagnosis present

## 2019-09-01 DIAGNOSIS — N184 Chronic kidney disease, stage 4 (severe): Secondary | ICD-10-CM | POA: Diagnosis present

## 2019-09-01 DIAGNOSIS — K219 Gastro-esophageal reflux disease without esophagitis: Secondary | ICD-10-CM | POA: Diagnosis present

## 2019-09-01 DIAGNOSIS — Z86711 Personal history of pulmonary embolism: Secondary | ICD-10-CM

## 2019-09-01 DIAGNOSIS — Z20822 Contact with and (suspected) exposure to covid-19: Secondary | ICD-10-CM | POA: Diagnosis present

## 2019-09-01 DIAGNOSIS — Z66 Do not resuscitate: Secondary | ICD-10-CM | POA: Diagnosis present

## 2019-09-01 DIAGNOSIS — Z7189 Other specified counseling: Secondary | ICD-10-CM | POA: Diagnosis not present

## 2019-09-01 DIAGNOSIS — I4891 Unspecified atrial fibrillation: Secondary | ICD-10-CM | POA: Diagnosis present

## 2019-09-01 DIAGNOSIS — Z800 Family history of malignant neoplasm of digestive organs: Secondary | ICD-10-CM

## 2019-09-01 DIAGNOSIS — R7989 Other specified abnormal findings of blood chemistry: Secondary | ICD-10-CM | POA: Diagnosis not present

## 2019-09-01 DIAGNOSIS — Z515 Encounter for palliative care: Secondary | ICD-10-CM | POA: Diagnosis not present

## 2019-09-01 DIAGNOSIS — N1832 Chronic kidney disease, stage 3b: Secondary | ICD-10-CM | POA: Diagnosis not present

## 2019-09-01 DIAGNOSIS — Z7951 Long term (current) use of inhaled steroids: Secondary | ICD-10-CM

## 2019-09-01 DIAGNOSIS — I480 Paroxysmal atrial fibrillation: Secondary | ICD-10-CM | POA: Diagnosis not present

## 2019-09-01 DIAGNOSIS — I10 Essential (primary) hypertension: Secondary | ICD-10-CM | POA: Diagnosis present

## 2019-09-01 DIAGNOSIS — R0602 Shortness of breath: Secondary | ICD-10-CM | POA: Diagnosis not present

## 2019-09-01 DIAGNOSIS — J9621 Acute and chronic respiratory failure with hypoxia: Secondary | ICD-10-CM | POA: Diagnosis not present

## 2019-09-01 DIAGNOSIS — Z7902 Long term (current) use of antithrombotics/antiplatelets: Secondary | ICD-10-CM

## 2019-09-01 DIAGNOSIS — Z811 Family history of alcohol abuse and dependence: Secondary | ICD-10-CM

## 2019-09-01 DIAGNOSIS — R069 Unspecified abnormalities of breathing: Secondary | ICD-10-CM | POA: Diagnosis not present

## 2019-09-01 DIAGNOSIS — I130 Hypertensive heart and chronic kidney disease with heart failure and stage 1 through stage 4 chronic kidney disease, or unspecified chronic kidney disease: Principal | ICD-10-CM | POA: Diagnosis present

## 2019-09-01 DIAGNOSIS — J441 Chronic obstructive pulmonary disease with (acute) exacerbation: Secondary | ICD-10-CM

## 2019-09-01 DIAGNOSIS — J449 Chronic obstructive pulmonary disease, unspecified: Secondary | ICD-10-CM | POA: Diagnosis present

## 2019-09-01 DIAGNOSIS — Z825 Family history of asthma and other chronic lower respiratory diseases: Secondary | ICD-10-CM

## 2019-09-01 DIAGNOSIS — D72829 Elevated white blood cell count, unspecified: Secondary | ICD-10-CM | POA: Diagnosis present

## 2019-09-01 DIAGNOSIS — Z832 Family history of diseases of the blood and blood-forming organs and certain disorders involving the immune mechanism: Secondary | ICD-10-CM

## 2019-09-01 DIAGNOSIS — N179 Acute kidney failure, unspecified: Secondary | ICD-10-CM | POA: Diagnosis present

## 2019-09-01 DIAGNOSIS — Z87891 Personal history of nicotine dependence: Secondary | ICD-10-CM

## 2019-09-01 DIAGNOSIS — Z808 Family history of malignant neoplasm of other organs or systems: Secondary | ICD-10-CM | POA: Diagnosis not present

## 2019-09-01 DIAGNOSIS — R000 Tachycardia, unspecified: Secondary | ICD-10-CM | POA: Diagnosis not present

## 2019-09-01 DIAGNOSIS — E782 Mixed hyperlipidemia: Secondary | ICD-10-CM | POA: Diagnosis present

## 2019-09-01 DIAGNOSIS — J439 Emphysema, unspecified: Secondary | ICD-10-CM | POA: Diagnosis present

## 2019-09-01 DIAGNOSIS — I248 Other forms of acute ischemic heart disease: Secondary | ICD-10-CM | POA: Diagnosis present

## 2019-09-01 DIAGNOSIS — I509 Heart failure, unspecified: Secondary | ICD-10-CM

## 2019-09-01 DIAGNOSIS — N183 Chronic kidney disease, stage 3 unspecified: Secondary | ICD-10-CM | POA: Diagnosis not present

## 2019-09-01 DIAGNOSIS — D45 Polycythemia vera: Secondary | ICD-10-CM | POA: Diagnosis present

## 2019-09-01 DIAGNOSIS — Z8249 Family history of ischemic heart disease and other diseases of the circulatory system: Secondary | ICD-10-CM

## 2019-09-01 DIAGNOSIS — I5033 Acute on chronic diastolic (congestive) heart failure: Secondary | ICD-10-CM

## 2019-09-01 DIAGNOSIS — Z8262 Family history of osteoporosis: Secondary | ICD-10-CM

## 2019-09-01 DIAGNOSIS — R079 Chest pain, unspecified: Secondary | ICD-10-CM | POA: Diagnosis not present

## 2019-09-01 DIAGNOSIS — Z1501 Genetic susceptibility to malignant neoplasm of breast: Secondary | ICD-10-CM

## 2019-09-01 DIAGNOSIS — Z9981 Dependence on supplemental oxygen: Secondary | ICD-10-CM

## 2019-09-01 LAB — BASIC METABOLIC PANEL
Anion gap: 18 — ABNORMAL HIGH (ref 5–15)
BUN: 26 mg/dL — ABNORMAL HIGH (ref 8–23)
CO2: 19 mmol/L — ABNORMAL LOW (ref 22–32)
Calcium: 9.6 mg/dL (ref 8.9–10.3)
Chloride: 101 mmol/L (ref 98–111)
Creatinine, Ser: 1.5 mg/dL — ABNORMAL HIGH (ref 0.44–1.00)
GFR calc Af Amer: 37 mL/min — ABNORMAL LOW (ref >60)
GFR calc non Af Amer: 32 mL/min — ABNORMAL LOW (ref >60)
Glucose, Bld: 189 mg/dL — ABNORMAL HIGH (ref 70–99)
Potassium: 4.1 mmol/L (ref 3.5–5.1)
Sodium: 138 mmol/L (ref 135–145)

## 2019-09-01 LAB — CBC WITH DIFFERENTIAL/PLATELET
Abs Immature Granulocytes: 0.13 10*3/uL — ABNORMAL HIGH (ref 0.00–0.07)
Basophils Absolute: 0 10*3/uL (ref 0.0–0.1)
Basophils Relative: 0 %
Eosinophils Absolute: 0 10*3/uL (ref 0.0–0.5)
Eosinophils Relative: 0 %
HCT: 38.5 % (ref 36.0–46.0)
Hemoglobin: 13.1 g/dL (ref 12.0–15.0)
Immature Granulocytes: 1 %
Lymphocytes Relative: 6 %
Lymphs Abs: 1.2 10*3/uL (ref 0.7–4.0)
MCH: 30.8 pg (ref 26.0–34.0)
MCHC: 34 g/dL (ref 30.0–36.0)
MCV: 90.6 fL (ref 80.0–100.0)
Monocytes Absolute: 2 10*3/uL — ABNORMAL HIGH (ref 0.1–1.0)
Monocytes Relative: 10 %
Neutro Abs: 18.2 10*3/uL — ABNORMAL HIGH (ref 1.7–7.7)
Neutrophils Relative %: 83 %
Platelets: 245 10*3/uL (ref 150–400)
RBC: 4.25 MIL/uL (ref 3.87–5.11)
RDW: 14.1 % (ref 11.5–15.5)
WBC: 21.6 10*3/uL — ABNORMAL HIGH (ref 4.0–10.5)
nRBC: 0 % (ref 0.0–0.2)

## 2019-09-01 LAB — BLOOD GAS, ARTERIAL
Acid-base deficit: 1.5 mmol/L (ref 0.0–2.0)
Bicarbonate: 21.1 mmol/L (ref 20.0–28.0)
FIO2: 0.35
O2 Saturation: 98.3 %
Patient temperature: 37
pCO2 arterial: 29 mmHg — ABNORMAL LOW (ref 32.0–48.0)
pH, Arterial: 7.47 — ABNORMAL HIGH (ref 7.350–7.450)
pO2, Arterial: 104 mmHg (ref 83.0–108.0)

## 2019-09-01 LAB — TROPONIN I (HIGH SENSITIVITY)
Troponin I (High Sensitivity): 40 ng/L — ABNORMAL HIGH (ref <18)
Troponin I (High Sensitivity): 39 ng/L — ABNORMAL HIGH (ref <18)
Troponin I (High Sensitivity): 37 ng/L — ABNORMAL HIGH (ref <18)

## 2019-09-01 LAB — PROTIME-INR
INR: 1.1 (ref 0.8–1.2)
Prothrombin Time: 13.7 seconds (ref 11.4–15.2)

## 2019-09-01 LAB — RESPIRATORY PANEL BY RT PCR (FLU A&B, COVID)
Influenza A by PCR: NEGATIVE
Influenza B by PCR: NEGATIVE
SARS Coronavirus 2 by RT PCR: NEGATIVE

## 2019-09-01 LAB — BRAIN NATRIURETIC PEPTIDE: B Natriuretic Peptide: 716 pg/mL — ABNORMAL HIGH (ref 0.0–100.0)

## 2019-09-01 LAB — APTT: aPTT: 30 seconds (ref 24–36)

## 2019-09-01 MED ORDER — CLOPIDOGREL BISULFATE 75 MG PO TABS
75.0000 mg | ORAL_TABLET | Freq: Every day | ORAL | Status: DC
  Administered 2019-09-01 – 2019-09-03 (×3): 75 mg via ORAL
  Filled 2019-09-01 (×4): qty 1

## 2019-09-01 MED ORDER — LOSARTAN POTASSIUM 50 MG PO TABS: 100.0000 mg | ORAL_TABLET | Freq: Every day | ORAL | Status: DC

## 2019-09-01 MED ORDER — IPRATROPIUM-ALBUTEROL 0.5-2.5 (3) MG/3ML IN SOLN
3.0000 mL | Freq: Once | RESPIRATORY_TRACT | Status: AC
  Administered 2019-09-01: 3 mL via RESPIRATORY_TRACT

## 2019-09-01 MED ORDER — GABAPENTIN 100 MG PO CAPS
200.0000 mg | ORAL_CAPSULE | Freq: Every day | ORAL | Status: DC
  Administered 2019-09-01 – 2019-09-03 (×3): 200 mg via ORAL
  Filled 2019-09-01 (×4): qty 2

## 2019-09-01 MED ORDER — AMLODIPINE BESYLATE 5 MG PO TABS: 10.0000 mg | ORAL_TABLET | Freq: Every day | ORAL | Status: DC

## 2019-09-01 MED ORDER — VITAMIN D 25 MCG (1000 UNIT) PO TABS
1000.0000 [IU] | ORAL_TABLET | Freq: Every day | ORAL | Status: DC
  Administered 2019-09-01 – 2019-09-03 (×3): 1000 [IU] via ORAL
  Filled 2019-09-01 (×4): qty 1

## 2019-09-01 MED ORDER — DILTIAZEM HCL 25 MG/5ML IV SOLN
10.0000 mg | Freq: Once | INTRAVENOUS | Status: AC
  Administered 2019-09-01: 06:00:00 10 mg via INTRAVENOUS

## 2019-09-01 MED ORDER — DILTIAZEM HCL ER COATED BEADS 120 MG PO CP24
240.0000 mg | ORAL_CAPSULE | Freq: Every day | ORAL | Status: DC
  Administered 2019-09-01 – 2019-09-03 (×3): 240 mg via ORAL
  Filled 2019-09-01: qty 2
  Filled 2019-09-01: qty 1
  Filled 2019-09-01: qty 2
  Filled 2019-09-01: qty 1
  Filled 2019-09-01: qty 2

## 2019-09-01 MED ORDER — FUROSEMIDE 10 MG/ML IJ SOLN
40.0000 mg | Freq: Two times a day (BID) | INTRAMUSCULAR | Status: DC
  Administered 2019-09-01 – 2019-09-03 (×3): 40 mg via INTRAVENOUS
  Filled 2019-09-01 (×4): qty 4

## 2019-09-01 MED ORDER — MOMETASONE FURO-FORMOTEROL FUM 200-5 MCG/ACT IN AERO
2.0000 | INHALATION_SPRAY | Freq: Two times a day (BID) | RESPIRATORY_TRACT | Status: DC
  Administered 2019-09-01 – 2019-09-03 (×4): 2 via RESPIRATORY_TRACT
  Filled 2019-09-01: qty 8.8

## 2019-09-01 MED ORDER — ONDANSETRON HCL 4 MG PO TABS: 4.0000 mg | ORAL_TABLET | Freq: Four times a day (QID) | ORAL | Status: DC | PRN

## 2019-09-01 MED ORDER — VITAMIN B-12 1000 MCG PO TABS
1000.0000 ug | ORAL_TABLET | Freq: Every day | ORAL | Status: DC
  Administered 2019-09-01 – 2019-09-03 (×3): 1000 ug via ORAL
  Filled 2019-09-01 (×4): qty 1

## 2019-09-01 MED ORDER — METHYLPREDNISOLONE SODIUM SUCC 125 MG IJ SOLR
INTRAMUSCULAR | Status: AC
  Administered 2019-09-01: 06:00:00 125 mg via INTRAVENOUS
  Filled 2019-09-01: qty 2

## 2019-09-01 MED ORDER — LOPERAMIDE HCL 2 MG PO CAPS: 2.0000 mg | ORAL_CAPSULE | ORAL | Status: DC | PRN

## 2019-09-01 MED ORDER — SODIUM CHLORIDE 0.9 % IV SOLN
500.0000 mg | Freq: Once | INTRAVENOUS | Status: AC
  Administered 2019-09-01: 23:00:00 500 mg via INTRAVENOUS
  Filled 2019-09-01 (×2): qty 500

## 2019-09-01 MED ORDER — IPRATROPIUM-ALBUTEROL 0.5-2.5 (3) MG/3ML IN SOLN
3.0000 mL | Freq: Four times a day (QID) | RESPIRATORY_TRACT | Status: DC | PRN
  Administered 2019-09-02: 01:00:00 3 mL via RESPIRATORY_TRACT
  Filled 2019-09-01: qty 3

## 2019-09-01 MED ORDER — HEPARIN SODIUM (PORCINE) 5000 UNIT/ML IJ SOLN: 5000.0000 [IU] | Freq: Three times a day (TID) | INTRAMUSCULAR | Status: DC

## 2019-09-01 MED ORDER — SODIUM CHLORIDE 0.9 % IV SOLN
INTRAVENOUS | Status: DC | PRN
  Administered 2019-09-01: 250 mL via INTRAVENOUS

## 2019-09-01 MED ORDER — DILTIAZEM HCL 25 MG/5ML IV SOLN
10.0000 mg | Freq: Once | INTRAVENOUS | Status: AC
  Administered 2019-09-01: 10 mg via INTRAVENOUS

## 2019-09-01 MED ORDER — FLUTICASONE PROPIONATE 50 MCG/ACT NA SUSP
2.0000 | Freq: Every day | NASAL | Status: DC | PRN
  Filled 2019-09-01: qty 16

## 2019-09-01 MED ORDER — METHYLPREDNISOLONE SODIUM SUCC 125 MG IJ SOLR: 125.0000 mg | Freq: Once | INTRAMUSCULAR | Status: AC

## 2019-09-01 MED ORDER — ONDANSETRON HCL 4 MG/2ML IJ SOLN: 4.0000 mg | Freq: Four times a day (QID) | INTRAMUSCULAR | Status: DC | PRN

## 2019-09-01 MED ORDER — HEPARIN (PORCINE) 25000 UT/250ML-% IV SOLN
1150.0000 [IU]/h | INTRAVENOUS | Status: AC
  Administered 2019-09-01: 21:00:00 850 [IU]/h via INTRAVENOUS
  Filled 2019-09-01 (×2): qty 250

## 2019-09-01 MED ORDER — IPRATROPIUM-ALBUTEROL 0.5-2.5 (3) MG/3ML IN SOLN: 3.0000 mL | Freq: Once | RESPIRATORY_TRACT | Status: AC

## 2019-09-01 MED ORDER — HYDRALAZINE HCL 50 MG PO TABS
50.0000 mg | ORAL_TABLET | Freq: Three times a day (TID) | ORAL | Status: DC
  Administered 2019-09-01 – 2019-09-03 (×6): 50 mg via ORAL
  Filled 2019-09-01 (×7): qty 1

## 2019-09-01 MED ORDER — ASPIRIN EC 81 MG PO TBEC
81.0000 mg | DELAYED_RELEASE_TABLET | Freq: Every day | ORAL | Status: DC
  Administered 2019-09-01 – 2019-09-03 (×3): 81 mg via ORAL
  Filled 2019-09-01 (×4): qty 1

## 2019-09-01 MED ORDER — SODIUM CHLORIDE 0.9 % IV SOLN
1.0000 g | Freq: Once | INTRAVENOUS | Status: AC
  Administered 2019-09-01: 23:00:00 1 g via INTRAVENOUS
  Filled 2019-09-01 (×2): qty 10

## 2019-09-01 MED ORDER — ACETAMINOPHEN 650 MG RE SUPP: 650.0000 mg | Freq: Four times a day (QID) | RECTAL | Status: DC | PRN

## 2019-09-01 MED ORDER — IPRATROPIUM-ALBUTEROL 0.5-2.5 (3) MG/3ML IN SOLN
RESPIRATORY_TRACT | Status: AC
  Administered 2019-09-01: 06:00:00 3 mL via RESPIRATORY_TRACT
  Filled 2019-09-01: qty 6

## 2019-09-01 MED ORDER — ACETAMINOPHEN 325 MG PO TABS
650.0000 mg | ORAL_TABLET | Freq: Four times a day (QID) | ORAL | Status: DC | PRN
  Administered 2019-09-03: 650 mg via ORAL
  Filled 2019-09-01: qty 2

## 2019-09-01 MED ORDER — DILTIAZEM HCL-DEXTROSE 125-5 MG/125ML-% IV SOLN (PREMIX)
5.0000 mg/h | INTRAVENOUS | Status: DC
  Administered 2019-09-01: 07:00:00 5 mg/h via INTRAVENOUS
  Administered 2019-09-01: 20:00:00 15 mg/h via INTRAVENOUS
  Filled 2019-09-01: qty 125

## 2019-09-01 MED ORDER — LABETALOL HCL 100 MG PO TABS
200.0000 mg | ORAL_TABLET | Freq: Two times a day (BID) | ORAL | Status: DC
  Administered 2019-09-01 – 2019-09-03 (×5): 200 mg via ORAL
  Filled 2019-09-01 (×5): qty 2
  Filled 2019-09-01 (×2): qty 1
  Filled 2019-09-01: qty 2

## 2019-09-01 MED ORDER — PRAVASTATIN SODIUM 40 MG PO TABS
40.0000 mg | ORAL_TABLET | Freq: Every day | ORAL | Status: DC
  Administered 2019-09-03: 17:00:00 40 mg via ORAL
  Filled 2019-09-01 (×2): qty 1

## 2019-09-01 MED ORDER — ROFLUMILAST 500 MCG PO TABS
250.0000 ug | ORAL_TABLET | Freq: Every day | ORAL | Status: DC
  Administered 2019-09-01 – 2019-09-03 (×3): 250 ug via ORAL
  Filled 2019-09-01 (×5): qty 1

## 2019-09-01 MED ORDER — HEPARIN BOLUS VIA INFUSION
2800.0000 [IU] | Freq: Once | INTRAVENOUS | Status: AC
  Administered 2019-09-01: 2800 [IU] via INTRAVENOUS
  Filled 2019-09-01: qty 2800

## 2019-09-01 NOTE — ED Notes (Signed)
Respiratory therapist at bedside to place patient on bipap.

## 2019-09-01 NOTE — Progress Notes (Signed)
Notified Alex at [facility] that patient has been placed on cardizem drip.

## 2019-09-01 NOTE — ED Triage Notes (Signed)
Patient coming [facility] from home for heart rate over 200 with hx of afib. Patient's oxygen saturation in the 80s on EMS arrival. Patient denies chest pain/discomfort. Patient c/o SOB.  Patient shocked twice in transport - once at 100 joules, once at 125.

## 2019-09-01 NOTE — ED Provider Notes (Signed)
Patient looks much better, have taken her off of BiPAP.  X-ray resembles CHF.  I will discuss with the hospitalist for admission.   Earleen Newport, MD 08/31/2019 1049

## 2019-09-01 NOTE — ED Provider Notes (Signed)
[facility] Emergency Department Provider Note  ____________________________________________   First MD Initiated Contact with Patient 08/29/2019 [phone]     (approximate)  I have reviewed the triage vital signs and the nursing notes.   HISTORY  Chief Complaint Tachycardia   HPI Tina [name] is a 85 y.o. female with below list of previous medical conditions including COPD CHF CAD and recent hospital admission for acute kidney injury and discharged on 08/28/2019 resents to the emergency department via EMS who states that the patient called 911 because of a rapid heart rate.  EMS also states that the patient has a history of atrial fibrillation and that they were unable to obtain a blood pressure and IV access while in route.  EMS states that the patient's heart rate was as high as 200. the medic admits that the patient was awake and conversant but with difficulty breathing.  The medic states that because he was concerned that the patient may become unstable he attempted to cardiovert the patient twice at 100 and then subsequently 125 J unsuccessfully.  On arrival to the emergency department the patient admits to difficulty breathing and states that her reason for calling EMS was secondary to difficulty breathing on awakening this morning.  Patient states that she did not have any chest pain however after being shocked twice while in route she now has chest discomfort.  The patient states that she has no history of atrial fibrillation to her knowledge.        Past Medical History:  Diagnosis Date  . Anemia   . Asthma   . Bladder cancer ([place])   . BRCA positive   . CAD (coronary artery disease)   . CHF (congestive heart failure) ([place])   . COPD (chronic obstructive pulmonary disease) ([place])   . Emphysema lung ([place])   . GERD (gastroesophageal reflux disease)   . History of colon polyps   . Hypercholesterolemia   . Hyperglycemia   . Hypertension   . Lung nodules     . Osteoporosis   . Personal history of tobacco use, presenting hazards to health 11/25/2014  . Polycythemia vera(238.4)   . Renal cyst   . Skin cancer     Patient Active Problem List   Diagnosis Date Noted  . Acute renal failure (ARF) ([place]) 08/28/2019  . Palliative care by specialist   . DNR (do not resuscitate) discussion   . Pleural effusion, left 08/17/2019  . Peripheral neuropathy 08/17/2019  . History of pulmonary embolism 08/17/2019  . Dyspnea 08/13/2019  . COPD with acute exacerbation ([place]) 08/02/2019  . Acute on chronic diastolic CHF (congestive heart failure) ([place]) 08/02/2019  . Abnormality of gait 08/02/2019  . Renal artery stenosis ([place]) 07/01/2019  . DOE (dyspnea on exertion) 05/25/2019  . Diarrhea 05/10/2019  . AKI (acute kidney injury) ([place])   . Hypokalemia   . Foot drop 04/02/2019  . Low back pain 03/16/2019  . Leg pain 03/16/2019  . Aortic atherosclerosis ([place]) 01/07/2019  . Hoarseness 06/23/2018  . Unsteadiness 05/11/2018  . CHF (congestive heart failure) ([place]) 10/20/2017  . Pulmonary embolus ([place]) 06/28/2017  . 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 Goals of care, counseling/discussion 03/02/2017  . Abnormal computed tomography of gastrointestinal tract   . Noninfectious diarrhea   . Ulceration of intestine   . Hernia, hiatal   . Bladder mass   . Mesenteric ischemia due to arterial insufficiency ([place])   . Polycythemia vera ([place]) 10/21/2016  .  Elevated troponin 06/14/2016  . CKD (chronic kidney disease), stage III ([place]) 06/14/2016  . Leukocytosis 06/14/2016  . Abdominal bruit 04/16/2016  . Abnormal mammogram 06/06/2015  . Essential hypertension 02/16/2015  . Numbness 01/24/2015  . Personal history of tobacco use, presenting hazards to health 11/25/2014  . BRCA positive   . COPD exacerbation ([place]) 11/14/2014  . Sensation of pressure in bladder area 08/15/2014  . Health care maintenance 08/15/2014  . Presence of coronary angioplasty  implant and graft 09/30/2013  . History of colonic polyps 08/25/2012  . Female genuine stress incontinence 06/28/2012  . Infection of urinary tract 06/28/2012  . CAD (coronary artery disease) 05/23/2012  . COPD, severe ([place]) 05/23/2012  . Hypercholesterolemia 05/23/2012  . Hyperglycemia 05/23/2012  . Polycythemia, secondary 05/23/2012  . Renal cyst 05/23/2012  . Osteoporosis 05/23/2012    Past Surgical History:  Procedure Laterality Date  . ANGIOPLASTY     coronary (x1)  . COLONOSCOPY WITH PROPOFOL N/A 03/02/2015   Procedure: COLONOSCOPY WITH PROPOFOL;  Surgeon: Lollie Sails, MD;  Location: [facility] ENDOSCOPY;  Service: Endoscopy;  Laterality: N/A;  . COLONOSCOPY WITH PROPOFOL N/A 02/13/2017   Procedure: COLONOSCOPY WITH PROPOFOL;  Surgeon: [name], MD;  Location: [facility] ENDOSCOPY;  Service: Endoscopy;  Laterality: N/A;  . CORONARY ANGIOPLASTY    . CORONARY ARTERY BYPASS GRAFT  09/17/2017   pt denies  . CYSTOSCOPY W/ RETROGRADES Bilateral 02/13/2017   Procedure: CYSTOSCOPY WITH RETROGRADE PYELOGRAM;  Surgeon: Hollice Espy, MD;  Location: [facility] ORS;  Service: Urology;  Laterality: Bilateral;  . CYSTOSCOPY W/ RETROGRADES Bilateral 09/19/2017   Procedure: CYSTOSCOPY WITH RETROGRADE PYELOGRAM;  Surgeon: Hollice Espy, MD;  Location: [facility] ORS;  Service: Urology;  Laterality: Bilateral;  . CYSTOSCOPY WITH BIOPSY  03/26/2017   Procedure: CYSTOSCOPY WITH BIOPSY;  Surgeon: Hollice Espy, MD;  Location: [facility] ORS;  Service: Urology;;  . Consuela Mimes WITH STENT PLACEMENT Bilateral 02/13/2017   Procedure: CYSTOSCOPY WITH STENT PLACEMENT;  Surgeon: Hollice Espy, MD;  Location: [facility] ORS;  Service: Urology;  Laterality: Bilateral;  . CYSTOSCOPY WITH STENT PLACEMENT Bilateral 03/26/2017   Procedure: CYSTOSCOPY WITH STENT EXCHANGE;  Surgeon: Hollice Espy, MD;  Location: [facility] ORS;  Service: Urology;  Laterality: Bilateral;  . ESOPHAGOGASTRODUODENOSCOPY (EGD) WITH PROPOFOL N/A 02/13/2017    Procedure: ESOPHAGOGASTRODUODENOSCOPY (EGD) WITH PROPOFOL;  Surgeon: [name], MD;  Location: [facility] ENDOSCOPY;  Service: Endoscopy;  Laterality: N/A;  . PORTA CATH INSERTION N/A 02/28/2017   Procedure: PORTA CATH INSERTION;  Surgeon: Algernon Huxley, MD;  Location: [facility] LAB;  Service: Cardiovascular;  Laterality: N/A;  . PORTA CATH REMOVAL N/A 12/24/2017   Procedure: PORTA CATH REMOVAL;  Surgeon: Algernon Huxley, MD;  Location: [place] LAB;  Service: Cardiovascular;  Laterality: N/A;  . SALPINGOOPHORECTOMY    . TONSILECTOMY/ADENOIDECTOMY WITH MYRINGOTOMY    . TONSILLECTOMY    . TRANSURETHRAL RESECTION OF BLADDER TUMOR N/A 02/13/2017   Procedure: TRANSURETHRAL RESECTION OF BLADDER TUMOR (TURBT);  Surgeon: Hollice Espy, MD;  Location: [facility] ORS;  Service: Urology;  Laterality: N/A;  . TUBAL LIGATION    . UPPER GI ENDOSCOPY  02/13/2017  . URETEROSCOPY Left 09/19/2017   Procedure: URETEROSCOPY;  Surgeon: Hollice Espy, MD;  Location: [facility] ORS;  Service: Urology;  Laterality: Left;  Marland Kitchen VISCERAL ANGIOGRAPHY N/A 01/23/2019   Procedure: VISCERAL ANGIOGRAPHY;  Surgeon: Algernon Huxley, MD;  Location: [facility];  Service: Cardiovascular;  Laterality: N/A;  . VISCERAL ANGIOGRAPHY N/A 06/09/2019   Procedure: VISCERAL ANGIOGRAPHY;  Surgeon:  Algernon Huxley, MD;  Location: [place] [facility];  Service: Cardiovascular;  Laterality: N/A;  . VISCERAL ARTERY INTERVENTION N/A 02/12/2017   Procedure: VISCERAL ARTERY INTERVENTION;  Surgeon: Algernon Huxley, MD;  Location: [place] [facility];  Service: Cardiovascular;  Laterality: N/A;    Prior to Admission medications   Medication Sig Start Date End Date Taking? Authorizing Provider  albuterol (PROVENTIL HFA;VENTOLIN HFA) 108 (90 Base) MCG/ACT inhaler Inhale 2 puffs into the lungs every 6 (six) hours as needed for wheezing or shortness of breath. 10/22/17   Bettey Costa, MD  amLODipine (NORVASC) 10 MG tablet Take 1 tablet (10 mg total) by mouth  daily. 08/23/19   Danford, Suann Larry, MD  aspirin EC 81 MG tablet Take 1 tablet (81 mg total) by mouth daily. 01/23/19   Algernon Huxley, MD  cholecalciferol (VITAMIN D) 1000 units tablet Take 1,000 Units by mouth daily.    [provider]  clopidogrel (PLAVIX) 75 MG tablet Take 1 tablet (75 mg total) by mouth daily. 03/29/18   Einar Pheasant, MD  cyanocobalamin 1000 MCG tablet Take 1,000 mcg by mouth daily.     [provider]  fluticasone (FLONASE) 50 MCG/ACT nasal spray USE 2 SPRAYS INTO BOTH  NOSTRILS DAILY AS NEEDED  FOR ALLERGIES. Patient taking differently: Place 2 sprays into both nostrils daily.  10/01/18   Einar Pheasant, MD  gabapentin (NEURONTIN) 100 MG capsule Take 200 mg by mouth at bedtime.  06/02/19   [provider]  hydrALAZINE (APRESOLINE) 50 MG tablet Take 50 mg by mouth 3 (three) times daily.     [provider]  ipratropium-albuterol (DUONEB) 0.5-2.5 (3) MG/3ML SOLN Take 3 mLs by nebulization every 6 (six) hours as needed (for shortness of breath). 11/14/14   Joanne Gavel, MD  labetalol (NORMODYNE) 200 MG tablet Take 1 tablet (200 mg total) by mouth 2 (two) times daily. Patient taking differently: Take 200 mg by mouth daily in the afternoon.  08/22/19   Danford, Suann Larry, MD  loperamide (IMODIUM) 2 MG capsule Take 1 capsule (2 mg total) by mouth as needed for diarrhea or loose stools. 08/22/19   Danford, Suann Larry, MD  losartan (COZAAR) 100 MG tablet Take 1 tablet (100 mg total) by mouth daily. 08/23/19   Danford, Suann Larry, MD  lovastatin (MEVACOR) 40 MG tablet TAKE 1 TABLET BY MOUTH  DAILY IN THE EVENING Patient taking differently: Take 40 mg by mouth every evening.  06/25/19   Einar Pheasant, MD  roflumilast (DALIRESP) 500 MCG TABS tablet Take 0.5 tablets (250 mcg total) by mouth daily. 08/29/19   Max Sane, MD  SYMBICORT 160-4.5 MCG/ACT inhaler USE 2 PUFFS BY MOUTH TWO  TIMES DAILY Patient taking differently: Inhale 2 puffs into  the lungs in the morning and at bedtime.  06/30/19   Einar Pheasant, MD  torsemide (DEMADEX) 20 MG tablet Take 1 tablet (20 mg total) by mouth daily as needed (swelling or shortness of breath). 08/06/19   Ezekiel Slocumb, DO    Allergies Evista [raloxifene] and Fluticasone-salmeterol  Family History  Problem Relation Age of Onset  . Cirrhosis Father        died age 88  . Alcohol abuse Father   . Asthma Mother   . Congestive Heart Failure Mother   . Breast cancer Mother        2 (1/2 sisters)  . Osteoarthritis Mother   . Colon cancer Mother   . Lupus Sister   .  Alcohol abuse Sister   . Ovarian cancer Sister   . Osteoporosis Sister   . Skin cancer Sister   . Breast cancer Cousin   . Breast cancer Maternal Aunt     Social History Social History   Tobacco Use  . Smoking status: Former Smoker    Packs/day: 1.00    Years: 45.00    Pack years: 45.00    Types: Cigarettes    Quit date: 11/11/2003    Years since quitting: 15.8  . Smokeless tobacco: Never Used  Substance Use Topics  . Alcohol use: Not Currently    Alcohol/week: 0.0 standard drinks  . Drug use: No    Review of Systems Constitutional: No fever/chills Eyes: No visual changes. ENT: No sore throat. Cardiovascular: Positive for chest pain.  Positive for rapid heartbeat Respiratory: Positive for cough and shortness of breath. Gastrointestinal: No abdominal pain.  No nausea, no vomiting.  No diarrhea.  No constipation. Genitourinary: Negative for dysuria. Musculoskeletal: Negative for neck pain.  Negative for back pain. Integumentary: Negative for rash. Neurological: Negative for headaches, focal weakness or numbness.   ____________________________________________   PHYSICAL EXAM:  VITAL SIGNS: ED Triage Vitals  Enc Vitals Group     BP 08/17/2019 0625 (!) 158/79     Pulse Rate 08/30/2019 0611 (!) 207     Resp 09/03/2019 0611 (!) 25     Temp 08/29/2019 0611 97.8 F (36.6 C)     Temp src --      SpO2  08/27/2019 0611 93 %     Weight 09/11/2019 0620 60 kg (132 lb 4.4 oz)     Height 08/15/2019 0620 1.651 m ('5\' 5"' )     Head Circumference --      Peak Flow --      Pain Score 08/24/2019 0620 0     Pain Loc --      Pain Edu? --      Excl. in [place]? --     Constitutional: Alert and oriented.  Eyes: Conjunctivae are normal.  Mouth/Throat: Patient is wearing a mask. Neck: No stridor.  No meningeal signs.   Cardiovascular: Tachycardia with a regular rhythm.  Good peripheral circulation. Grossly normal heart sounds. Respiratory: Tachypnea, positive accessory respiratory muscle use, diffuse rhonchi Gastrointestinal: Soft and nontender. No distention.  Musculoskeletal: No lower extremity tenderness nor edema. No gross deformities of extremities. Neurologic:  Normal speech and language. No gross focal neurologic deficits are appreciated.  Skin:  Skin is warm, dry and intact. Psychiatric: Mood and affect are normal. Speech and behavior are normal.  ____________________________________________   LABS (all labs ordered are listed, but only abnormal results are displayed)  Labs Reviewed  CBC WITH DIFFERENTIAL/PLATELET - Abnormal; Notable for the following components:      Result Value   WBC 21.6 (*)    Neutro Abs 18.2 (*)    Monocytes Absolute 2.0 (*)    Abs Immature Granulocytes 0.13 (*)    All other components within normal limits  BRAIN NATRIURETIC PEPTIDE  BASIC METABOLIC PANEL  TROPONIN I (HIGH SENSITIVITY)   ____________________________________________  EKG  ED ECG REPORT I, [place] Shakendra Griffeth, the attending physician, personally viewed and interpreted this ECG.   Date: 08/20/2019  EKG Time: 6:15 AM  Rate: 210  Rhythm: Atrial fibrillation with rapid ventricular response  Axis: Normal  Intervals: Normal  ST&T Change: None  ____________________________________________  RADIOLOGY I, [place] Drew Lips, personally viewed and evaluated these images (plain radiographs) as part of my  medical  decision making, as well as reviewing the written report by the radiologist.  ED MD interpretation: CHF with left pleural effusion and left lower lobe atelectasis on chest x-ray per radiologist  Official radiology report(s): DG Chest Port 1 View  Result Date: 09/12/2019 CLINICAL DATA:  Shortness of breath and tachycardia. EXAM: PORTABLE CHEST 1 VIEW COMPARISON:  08/28/2019 FINDINGS: External pacer paddles are noted. The cardiac silhouette, mediastinal and hilar contours are within normal limits and stable. Stable tortuosity and calcification of the thoracic aorta. Moderate central vascular congestion and interstitial pulmonary edema along with a persistent left pleural effusion and left lower lobe atelectasis. No pneumothorax. The bony thorax is intact. IMPRESSION: CHF with left pleural effusion and left lower lobe atelectasis. Electronically Signed   By: Marijo Sanes M.D.   On: 09/08/2019 07:19    ____________________________________________   PROCEDURES    .Critical Care Performed by: Gregor Hams, MD Authorized by: Gregor Hams, MD   Critical care provider statement:    Critical care time (minutes):  30   Critical care time was exclusive of:  Separately billable procedures and treating other patients   Critical care was necessary to treat or prevent imminent or life-threatening deterioration of the following conditions:  Respiratory failure   Critical care was time spent personally by me on the following activities:  Development of treatment plan with patient or surrogate, discussions with consultants, evaluation of patient's response to treatment, examination of patient, obtaining history from patient or surrogate, ordering and performing treatments and interventions, ordering and review of laboratory studies, ordering and review of radiographic studies, pulse oximetry, re-evaluation of patient's condition and review of old  charts     ____________________________________________   INITIAL IMPRESSION / MDM / [place] / ED COURSE  As part of my medical decision making, I reviewed the following data within the electronic MEDICAL RECORD NUMBER   85 year old female presented with above-stated history and physical exam differential diagnosis including but not limited to COPD exacerbation, pneumonia and atrial fibrillation with rapid ventricular response.  Patient given 20 mg of IV diltiazem upon arrival with rate improvement from 190s to 150s at present.  Patient subsequently placed on a diltiazem infusion with continued rate improvement.  Regarding the patient's respiratory status patient received 2 duo nebs, Solu-Medrol 125 mg medial upon arrival however patient remains tachypneic with positive accessory respiratory muscle use.  As such BiPAP was applied.  After being on the BiPAP for approximately 20 minutes patient reevaluated and states that her breathing has much improved.  Patient is no longer tachypneic at this time.  Patient given IV ceftriaxone 1 g and azithromycin 500 mg.  Laboratory data still pending.  Patient's care transferred to Dr. Jimmye Norman   ____________________________________________  FINAL CLINICAL IMPRESSION(S) / ED DIAGNOSES  Final diagnoses:  Atrial fibrillation with rapid ventricular response (HCC)  COPD exacerbation ([place])     MEDICATIONS GIVEN DURING THIS VISIT:  Medications  diltiazem (CARDIZEM) 125 mg in dextrose 5% 125 mL (1 mg/mL) infusion (10 mg/hr Intravenous Rate/Dose Change 09/10/2019 0727)  diltiazem (CARDIZEM) injection 10 mg (10 mg Intravenous Given 08/19/2019 0615)  ipratropium-albuterol (DUONEB) 0.5-2.5 (3) MG/3ML nebulizer solution 3 mL (3 mLs Nebulization Given 08/31/2019 0619)  ipratropium-albuterol (DUONEB) 0.5-2.5 (3) MG/3ML nebulizer solution 3 mL (3 mLs Nebulization Given 09/03/2019 0619)  methylPREDNISolone sodium succinate (SOLU-MEDROL) 125 mg/2 mL injection 125 mg (125  mg Intravenous Given 09/04/2019 0619)  diltiazem (CARDIZEM) injection 10 mg (10 mg Intravenous Given 08/24/2019 0620)  ED Discharge Orders    None      *Please note:  SHAGUANA LOVE was evaluated in Emergency Department on 09/03/2019 for the symptoms described in the history of present illness. She was evaluated in the context of the global COVID-19 pandemic, which necessitated consideration that the patient might be at risk for infection with the SARS-CoV-2 virus that causes COVID-19. Institutional protocols and algorithms that pertain to the evaluation of patients at risk for COVID-19 are in a state of rapid change based on information released by regulatory bodies including the CDC and federal and state organizations. These policies and algorithms were followed during the patient's care in the ED.  Some ED evaluations and interventions may be delayed as a result of limited staffing during the pandemic.*  Note:  This document was prepared using Dragon voice recognition software and may include unintentional dictation errors.   Gregor Hams, MD 09/12/2019 [phone]

## 2019-09-01 NOTE — ED Notes (Signed)
ED Provider at bedside. 

## 2019-09-01 NOTE — H&P (Addendum)
History and Physical    Tina [name] ENI:[id number] DOB: Sep 27, 1934 DOA: 09/04/2019  PCP: Einar Pheasant, MD   Patient coming from: Home  I have personally briefly reviewed patient's old medical records in [place]  Chief Complaint: Shortness of breath  HPI: Tina [name] is a 85 y.o. female with medical history significant forCOPD,hypertension,diastolic CHF,CAD, CKD 3b whopresented for evaluation of worsening dyspnea.  Patient was recently discharged from the hospital 04/15 - 04/16 and states that she has remained short of breath following her discharge but got worse on the morning of her admission. She was brought to the emergency room by EMS who stated that when they arrived patient had a heart rate in the 200s and the rhythm was rapid atrial fibrillation.  Pulse oximetry when EMS arrived it was in the 80s and they were unable to obtain a blood pressure.  Patient was cardioverted in the field for unstable rapid A. fib once at 100 J and subsequently at 125 J without any improvement in her rate.  She was started on a Cardizem drip in the emergency room after she received Cardizem 20 mg IV push. She was also placed on BiPAP to reduce work of breathing and at the time of my evaluation she appears comfortable and in no obvious distress. She denies having any chest pain, no nausea, no vomiting, no diaphoresis, no abdominal pain, no fever chills or any changes in her bowel habits.  ED Course: 85 year old female presented by EMS for evaluation of shortness of breath and in the field was found to be in atrial fibrillation with rapid ventricular rate.  Attempts were made to cardiovert patient in the field without any success. Patient was given 20 mg of IV diltiazem upon arrival with rate improvement from 190s to 150s at present.  Patient subsequently placed on a diltiazem infusion with continued rate improvement.   Regarding the patient's respiratory status patient received 2 duo nebs,  Solu-Medrol 125 mg medial upon arrival however patient remains tachypneic with positive accessory respiratory muscle use.  As such BiPAP was applied.  After being on the BiPAP for approximately 20 minutes patient reevaluated and states that her breathing has much improved.  Patient is no longer tachypneic at this time.   Patient also received IV ceftriaxone 1 g and azithromycin 500 mg.    Review of Systems: As per HPI otherwise 10 point review of systems negative.    Past Medical History:  Diagnosis Date  . Anemia   . Asthma   . Bladder cancer ([place])   . BRCA positive   . CAD (coronary artery disease)   . CHF (congestive heart failure) ([place])   . COPD (chronic obstructive pulmonary disease) ([place])   . Emphysema lung ([place])   . GERD (gastroesophageal reflux disease)   . History of colon polyps   . Hypercholesterolemia   . Hyperglycemia   . Hypertension   . Lung nodules   . Osteoporosis   . Personal history of tobacco use, presenting hazards to health 11/25/2014  . Polycythemia vera(238.4)   . Renal cyst   . Skin cancer     Past Surgical History:  Procedure Laterality Date  . ANGIOPLASTY     coronary (x1)  . COLONOSCOPY WITH PROPOFOL N/A 03/02/2015   Procedure: COLONOSCOPY WITH PROPOFOL;  Surgeon: Lollie Sails, MD;  Location: [facility] ENDOSCOPY;  Service: Endoscopy;  Laterality: N/A;  . COLONOSCOPY WITH PROPOFOL N/A 02/13/2017   Procedure: COLONOSCOPY WITH PROPOFOL;  Surgeon: [name],  MD;  Location: [facility] ENDOSCOPY;  Service: Endoscopy;  Laterality: N/A;  . CORONARY ANGIOPLASTY    . CORONARY ARTERY BYPASS GRAFT  09/17/2017   pt denies  . CYSTOSCOPY W/ RETROGRADES Bilateral 02/13/2017   Procedure: CYSTOSCOPY WITH RETROGRADE PYELOGRAM;  Surgeon: Hollice Espy, MD;  Location: [facility] ORS;  Service: Urology;  Laterality: Bilateral;  . CYSTOSCOPY W/ RETROGRADES Bilateral 09/19/2017   Procedure: CYSTOSCOPY WITH RETROGRADE PYELOGRAM;  Surgeon: Hollice Espy, MD;  Location: [facility] ORS;   Service: Urology;  Laterality: Bilateral;  . CYSTOSCOPY WITH BIOPSY  03/26/2017   Procedure: CYSTOSCOPY WITH BIOPSY;  Surgeon: Hollice Espy, MD;  Location: [facility] ORS;  Service: Urology;;  . Consuela Mimes WITH STENT PLACEMENT Bilateral 02/13/2017   Procedure: CYSTOSCOPY WITH STENT PLACEMENT;  Surgeon: Hollice Espy, MD;  Location: [facility] ORS;  Service: Urology;  Laterality: Bilateral;  . CYSTOSCOPY WITH STENT PLACEMENT Bilateral 03/26/2017   Procedure: CYSTOSCOPY WITH STENT EXCHANGE;  Surgeon: Hollice Espy, MD;  Location: [facility] ORS;  Service: Urology;  Laterality: Bilateral;  . ESOPHAGOGASTRODUODENOSCOPY (EGD) WITH PROPOFOL N/A 02/13/2017   Procedure: ESOPHAGOGASTRODUODENOSCOPY (EGD) WITH PROPOFOL;  Surgeon: [name], MD;  Location: [facility] ENDOSCOPY;  Service: Endoscopy;  Laterality: N/A;  . PORTA CATH INSERTION N/A 02/28/2017   Procedure: PORTA CATH INSERTION;  Surgeon: Algernon Huxley, MD;  Location: [place] CV LAB;  Service: Cardiovascular;  Laterality: N/A;  . PORTA CATH REMOVAL N/A 12/24/2017   Procedure: PORTA CATH REMOVAL;  Surgeon: Algernon Huxley, MD;  Location: [place] CV LAB;  Service: Cardiovascular;  Laterality: N/A;  . SALPINGOOPHORECTOMY    . TONSILECTOMY/ADENOIDECTOMY WITH MYRINGOTOMY    . TONSILLECTOMY    . TRANSURETHRAL RESECTION OF BLADDER TUMOR N/A 02/13/2017   Procedure: TRANSURETHRAL RESECTION OF BLADDER TUMOR (TURBT);  Surgeon: Hollice Espy, MD;  Location: [facility] ORS;  Service: Urology;  Laterality: N/A;  . TUBAL LIGATION    . UPPER GI ENDOSCOPY  02/13/2017  . URETEROSCOPY Left 09/19/2017   Procedure: URETEROSCOPY;  Surgeon: Hollice Espy, MD;  Location: [facility] ORS;  Service: Urology;  Laterality: Left;  Marland Kitchen VISCERAL ANGIOGRAPHY N/A 01/23/2019   Procedure: VISCERAL ANGIOGRAPHY;  Surgeon: Algernon Huxley, MD;  Location: [place] CV LAB;  Service: Cardiovascular;  Laterality: N/A;  . VISCERAL ANGIOGRAPHY N/A 06/09/2019   Procedure: VISCERAL ANGIOGRAPHY;  Surgeon: Algernon Huxley, MD;  Location: [place] CV LAB;  Service: Cardiovascular;  Laterality: N/A;  . VISCERAL ARTERY INTERVENTION N/A 02/12/2017   Procedure: VISCERAL ARTERY INTERVENTION;  Surgeon: Algernon Huxley, MD;  Location: [place] CV LAB;  Service: Cardiovascular;  Laterality: N/A;     reports that she quit smoking about 15 years ago. Her smoking use included cigarettes. She has a 45.00 pack-year smoking history. She has never used smokeless tobacco. She reports previous alcohol use. She reports that she does not use drugs.  Allergies  Allergen Reactions  . Evista [Raloxifene] Other (See Comments)    Night sweats   . Fluticasone-Salmeterol Other (See Comments)    Cough, "chokes me"     Family History  Problem Relation Age of Onset  . Cirrhosis Father        died age 89  . Alcohol abuse Father   . Asthma Mother   . Congestive Heart Failure Mother   . Breast cancer Mother        2 (1/2 sisters)  . Osteoarthritis Mother   . Colon cancer Mother   . Lupus Sister   . Alcohol abuse Sister   . Ovarian  cancer Sister   . Osteoporosis Sister   . Skin cancer Sister   . Breast cancer Cousin   . Breast cancer Maternal Aunt      Prior to Admission medications   Medication Sig Start Date End Date Taking? Authorizing Provider  albuterol (PROVENTIL HFA;VENTOLIN HFA) 108 (90 Base) MCG/ACT inhaler Inhale 2 puffs into the lungs every 6 (six) hours as needed for wheezing or shortness of breath. 10/22/17   Bettey Costa, MD  amLODipine (NORVASC) 10 MG tablet Take 1 tablet (10 mg total) by mouth daily. 08/23/19   Danford, Suann Larry, MD  aspirin EC 81 MG tablet Take 1 tablet (81 mg total) by mouth daily. 01/23/19   Algernon Huxley, MD  cholecalciferol (VITAMIN D) 1000 units tablet Take 1,000 Units by mouth daily.    [provider]  clopidogrel (PLAVIX) 75 MG tablet Take 1 tablet (75 mg total) by mouth daily. 03/29/18   Einar Pheasant, MD  cyanocobalamin 1000 MCG tablet Take 1,000 mcg by  mouth daily.     [provider]  fluticasone (FLONASE) 50 MCG/ACT nasal spray USE 2 SPRAYS INTO BOTH  NOSTRILS DAILY AS NEEDED  FOR ALLERGIES. Patient taking differently: Place 2 sprays into both nostrils daily.  10/01/18   Einar Pheasant, MD  gabapentin (NEURONTIN) 100 MG capsule Take 200 mg by mouth at bedtime.  06/02/19   [provider]  hydrALAZINE (APRESOLINE) 50 MG tablet Take 50 mg by mouth 3 (three) times daily.     [provider]  ipratropium-albuterol (DUONEB) 0.5-2.5 (3) MG/3ML SOLN Take 3 mLs by nebulization every 6 (six) hours as needed (for shortness of breath). 11/14/14   Joanne Gavel, MD  labetalol (NORMODYNE) 200 MG tablet Take 1 tablet (200 mg total) by mouth 2 (two) times daily. Patient taking differently: Take 200 mg by mouth daily in the afternoon.  08/22/19   Danford, Suann Larry, MD  loperamide (IMODIUM) 2 MG capsule Take 1 capsule (2 mg total) by mouth as needed for diarrhea or loose stools. 08/22/19   Danford, Suann Larry, MD  losartan (COZAAR) 100 MG tablet Take 1 tablet (100 mg total) by mouth daily. 08/23/19   Danford, Suann Larry, MD  lovastatin (MEVACOR) 40 MG tablet TAKE 1 TABLET BY MOUTH  DAILY IN THE EVENING Patient taking differently: Take 40 mg by mouth every evening.  06/25/19   Einar Pheasant, MD  roflumilast (DALIRESP) 500 MCG TABS tablet Take 0.5 tablets (250 mcg total) by mouth daily. 08/29/19   Max Sane, MD  SYMBICORT 160-4.5 MCG/ACT inhaler USE 2 PUFFS BY MOUTH TWO  TIMES DAILY Patient taking differently: Inhale 2 puffs into the lungs in the morning and at bedtime.  06/30/19   Einar Pheasant, MD  torsemide (DEMADEX) 20 MG tablet Take 1 tablet (20 mg total) by mouth daily as needed (swelling or shortness of breath). 08/06/19   Ezekiel Slocumb, DO    Physical Exam: Vitals:   09/02/2019 0730 08/18/2019 0815 08/16/2019 0830 09/11/2019 0841  BP: (!) 181/87 (!) 134/100 (!) 161/105 136/71  Pulse: (!) 151 78 (!) 127 (!) 127  Resp: (!)  24 (!) 23 (!) 23 (!) 23  Temp:      SpO2: 97% 96% 96% 96%  Weight:      Height:         Vitals:   08/27/2019 0730 08/14/2019 0815 08/27/2019 0830 08/15/2019 0841  BP: (!) 181/87 (!) 134/100 (!) 161/105 136/71  Pulse: (!) 151 78 (!) 127 Marland Kitchen)  127  Resp: (!) 24 (!) 23 (!) 23 (!) 23  Temp:      SpO2: 97% 96% 96% 96%  Weight:      Height:        Constitutional: NAD, alert and oriented x 3.  On BiPAP but appears comfortable Eyes: PERRL, lids and conjunctivae normal ENMT: Mucous membranes are moist.  Neck: normal, supple, no masses, no thyromegaly Respiratory: Bilateral air entry, no wheezing, no crackles. Normal respiratory effort. No accessory muscle use.  Cardiovascular: Tachycardic, irregularly irregular no murmurs / rubs / gallops. 1+ extremity edema. 2+ pedal pulses. No carotid bruits.  Abdomen: no tenderness, no masses palpated. No hepatosplenomegaly. Bowel sounds positive.  Musculoskeletal: no clubbing / cyanosis. No joint deformity upper and lower extremities.  Skin: no rashes, lesions, ulcers.  Ecchymosis on lower extremities Neurologic: No gross focal neurologic deficit. Psychiatric: Normal mood and affect.   Labs on Admission: I have personally reviewed following labs and imaging studies  CBC: Recent Labs  Lab 08/28/19 1304 08/29/19 0442 08/17/2019 0631  WBC 12.2* 7.8 21.6*  NEUTROABS  --   --  18.2*  HGB 12.7 11.9* 13.1  HCT 38.8 35.6* 38.5  MCV 94.2 92.2 90.6  PLT 199 184 017   Basic Metabolic Panel: Recent Labs  Lab 08/28/19 1304 08/29/19 0442 09/04/2019 1026  NA 136 140 138  K 4.7 4.0 4.1  CL 98 102 101  CO2 27 28 19*  GLUCOSE 139* 107* 189*  BUN 42* 34* 26*  CREATININE 1.81* 1.20* 1.50*  CALCIUM 9.2 9.2 9.6   GFR: Estimated Creatinine Clearance: 26 mL/min (A) (by C-G formula based on SCr of 1.5 mg/dL (H)). Liver Function Tests: Recent Labs  Lab 08/28/19 1304  AST 13*  ALT 15  ALKPHOS 56  BILITOT 1.3*  PROT 6.1*  ALBUMIN 3.5   No results for  input(s): LIPASE, AMYLASE in the last 168 hours. No results for input(s): AMMONIA in the last 168 hours. Coagulation Profile: No results for input(s): INR, PROTIME in the last 168 hours. Cardiac Enzymes: No results for input(s): CKTOTAL, CKMB, CKMBINDEX, TROPONINI in the last 168 hours. BNP (last 3 results) No results for input(s): PROBNP in the last 8760 hours. HbA1C: No results for input(s): HGBA1C in the last 72 hours. CBG: Recent Labs  Lab 08/29/19 0757  GLUCAP 119*   Lipid Profile: No results for input(s): CHOL, HDL, LDLCALC, TRIG, CHOLHDL, LDLDIRECT in the last 72 hours. Thyroid Function Tests: No results for input(s): TSH, T4TOTAL, FREET4, T3FREE, THYROIDAB in the last 72 hours. Anemia Panel: No results for input(s): VITAMINB12, FOLATE, FERRITIN, TIBC, IRON, RETICCTPCT in the last 72 hours. Urine analysis:    Component Value Date/Time   COLORURINE YELLOW (A) 08/05/2019 1257   APPEARANCEUR CLEAR (A) 08/05/2019 1257   APPEARANCEUR Clear 09/13/2017 1502   LABSPEC 1.016 08/05/2019 1257   PHURINE 5.0 08/05/2019 1257   GLUCOSEU NEGATIVE 08/05/2019 1257   GLUCOSEU NEGATIVE 10/09/2017 1121   HGBUR NEGATIVE 08/05/2019 1257   BILIRUBINUR NEGATIVE 08/05/2019 1257   BILIRUBINUR Negative 09/13/2017 1502   KETONESUR NEGATIVE 08/05/2019 1257   PROTEINUR NEGATIVE 08/05/2019 1257   UROBILINOGEN 0.2 10/09/2017 1121   NITRITE NEGATIVE 08/05/2019 1257   LEUKOCYTESUR NEGATIVE 08/05/2019 1257    Radiological Exams on Admission: DG Chest Port 1 View  Result Date: 08/27/2019 CLINICAL DATA:  Shortness of breath and tachycardia. EXAM: PORTABLE CHEST 1 VIEW COMPARISON:  08/28/2019 FINDINGS: External pacer paddles are noted. The cardiac silhouette, mediastinal and hilar contours are within  normal limits and stable. Stable tortuosity and calcification of the thoracic aorta. Moderate central vascular congestion and interstitial pulmonary edema along with a persistent left pleural effusion and  left lower lobe atelectasis. No pneumothorax. The bony thorax is intact. IMPRESSION: CHF with left pleural effusion and left lower lobe atelectasis. Electronically Signed   By: Marijo Sanes M.D.   On: 08/24/2019 07:19    EKG: Independently reviewed.  Supraventricular tachycardia  Assessment/Plan Principal Problem:   Atrial fibrillation with RVR (HCC) Active Problems:   COPD, severe (HCC)   Essential hypertension   Leukocytosis   CHF (congestive heart failure) (HCC)   Rapid atrial fibrillation (HCC)   CKD (chronic kidney disease), stage IV (HCC)    Atrial fibrillation with rapid ventricular rate New onset atrial fibrillation Patient denies having a history of known A. Fib Attempts were made in the field to cardiovert patient but this was unsuccessful Continue Cardizem drip She has a CHADS2VASC score of 6 and ideally requires anticoagulation as primary prophylaxis for an acute stroke Will consult Cardiology   Acute diastolic dysfunction CHF Patient presents for evaluation of worsening shortness of breath from her baseline She was also noted to be in rapid atrial fibrillation BNP is elevated and chest x-ray shows moderate central vascular congestion and interstitial pulmonary edema along with a persistent left pleural effusion and left lower lobe atelectasis. No pneumothorax. Last 2D echocardiogram shows an LVEF of 50 to 55% We will place patient on Lasix 40 mg IV twice daily Continue labetalol and Cozaar  Acute respiratory failure Secondary to acute diastolic dysfunction CHF with superimposed COPD Patient is currently on BiPAP to reduce work of breathing. Will attempt to wean off BiPAP as tolerated   Hypertensive urgency Optimize blood pressure control Continue labetalol, Cozaar and hydralazine Amlodipine is on hold since patient is on a Cardizem drip   Stage IV chronic kidney disease Most likely related to hypertensive nephrosclerosis Monitor closely during this  hospitalization   Leukocytosis Probably leukemoid reaction Most likely related to recent systemic steroid administration  DVT prophylaxis: Heparin Code Status: CODE STATUS was discussed with patient at the bedside.  She request to be placed on a DO NOT RESUSCITATE status Family Communication: Plan of care was discussed with patient and bedside.  They verbalized understanding and agreed with the plan Disposition Plan: Back to previous home environment Consults called: Cardiology    Akaisha Truman MD [facility]     09/10/2019, 11:13 AM

## 2019-09-01 NOTE — ED Notes (Signed)
Patient reports increased comfort of breathing post bipap placement. Decreased work of breathing noted on assessment.

## 2019-09-01 NOTE — ED Notes (Signed)
Bed ready @ 1754, patient going to room 246.

## 2019-09-01 NOTE — Consult Note (Signed)
Tina [name]  [name], MRN: [id number], DOB/AGE: 01/17/1935 85 y.o. Admit date: 08/28/2019   Date of Consult: 08/31/2019 Primary Physician: Einar Pheasant, MD Primary Cardiologist: Paraschos  Chief Complaint:  Chief Complaint  Patient presents with  . Tachycardia   Reason for Consult: Atrial fibrillation  HPI: 85 y.o. female with known coronary artery disease essential hypertension mixed hyperlipidemia chronic kidney disease stage III who has had new onset of severe shortness of breath weakness fatigue and palpitations.  The patient was seen in the emergency room and had an EKG showing atrial fibrillation with rapid ventricular rate and nonspecific ST changes.  She had a troponin of 40 most consistent with demand ischemia rather than acute coronary syndrome due to no evidence of significant amount of chest discomfort.  BNP was 716 with some pulmonary edema by chest x-ray consistent with acute on chronic diastolic dysfunction congestive heart failure did likely secondary to hypoxia and atrial fibrillation with rapid ventricular rate.  The patient was placed on diltiazem drip which has had improvements of heart rate control and currently is stable hemodynamically with no evidence of further significant symptoms  Past Medical History:  Diagnosis Date  . Anemia   . Asthma   . Bladder cancer ([place])   . BRCA positive   . CAD (coronary artery disease)   . CHF (congestive heart failure) ([place])   . COPD (chronic obstructive pulmonary disease) ([place])   . Emphysema lung ([place])   . GERD (gastroesophageal reflux disease)   . History of colon polyps   . Hypercholesterolemia   . Hyperglycemia   . Hypertension   . Lung nodules   . Osteoporosis   . Personal history of tobacco use, presenting hazards to health 11/25/2014  . Polycythemia vera(238.4)   . Renal cyst   . Skin cancer       Surgical History:  Past Surgical History:  Procedure Laterality  Date  . ANGIOPLASTY     coronary (x1)  . COLONOSCOPY WITH PROPOFOL N/A 03/02/2015   Procedure: COLONOSCOPY WITH PROPOFOL;  Surgeon: Lollie Sails, MD;  Location: [facility] ENDOSCOPY;  Service: Endoscopy;  Laterality: N/A;  . COLONOSCOPY WITH PROPOFOL N/A 02/13/2017   Procedure: COLONOSCOPY WITH PROPOFOL;  Surgeon: [name], MD;  Location: [facility] ENDOSCOPY;  Service: Endoscopy;  Laterality: N/A;  . CORONARY ANGIOPLASTY    . CORONARY ARTERY BYPASS GRAFT  09/17/2017   pt denies  . CYSTOSCOPY W/ RETROGRADES Bilateral 02/13/2017   Procedure: CYSTOSCOPY WITH RETROGRADE PYELOGRAM;  Surgeon: Hollice Espy, MD;  Location: [facility] ORS;  Service: Urology;  Laterality: Bilateral;  . CYSTOSCOPY W/ RETROGRADES Bilateral 09/19/2017   Procedure: CYSTOSCOPY WITH RETROGRADE PYELOGRAM;  Surgeon: Hollice Espy, MD;  Location: [facility] ORS;  Service: Urology;  Laterality: Bilateral;  . CYSTOSCOPY WITH BIOPSY  03/26/2017   Procedure: CYSTOSCOPY WITH BIOPSY;  Surgeon: Hollice Espy, MD;  Location: [facility] ORS;  Service: Urology;;  . Consuela Mimes WITH STENT PLACEMENT Bilateral 02/13/2017   Procedure: CYSTOSCOPY WITH STENT PLACEMENT;  Surgeon: Hollice Espy, MD;  Location: [facility] ORS;  Service: Urology;  Laterality: Bilateral;  . CYSTOSCOPY WITH STENT PLACEMENT Bilateral 03/26/2017   Procedure: CYSTOSCOPY WITH STENT EXCHANGE;  Surgeon: Hollice Espy, MD;  Location: [facility] ORS;  Service: Urology;  Laterality: Bilateral;  . ESOPHAGOGASTRODUODENOSCOPY (EGD) WITH PROPOFOL N/A 02/13/2017   Procedure: ESOPHAGOGASTRODUODENOSCOPY (EGD) WITH PROPOFOL;  Surgeon: [name], MD;  Location: [facility] ENDOSCOPY;  Service: Endoscopy;  Laterality: N/A;  . PORTA CATH INSERTION N/A 02/28/2017  Procedure: PORTA CATH INSERTION;  Surgeon: Algernon Huxley, MD;  Location: [place] CV LAB;  Service: Cardiovascular;  Laterality: N/A;  . PORTA CATH REMOVAL N/A 12/24/2017   Procedure: PORTA CATH REMOVAL;  Surgeon: Algernon Huxley, MD;  Location: [place] CV LAB;  Service: Cardiovascular;  Laterality: N/A;  . SALPINGOOPHORECTOMY    . TONSILECTOMY/ADENOIDECTOMY WITH MYRINGOTOMY    . TONSILLECTOMY    . TRANSURETHRAL RESECTION OF BLADDER TUMOR N/A 02/13/2017   Procedure: TRANSURETHRAL RESECTION OF BLADDER TUMOR (TURBT);  Surgeon: Hollice Espy, MD;  Location: [facility] ORS;  Service: Urology;  Laterality: N/A;  . TUBAL LIGATION    . UPPER GI ENDOSCOPY  02/13/2017  . URETEROSCOPY Left 09/19/2017   Procedure: URETEROSCOPY;  Surgeon: Hollice Espy, MD;  Location: [facility] ORS;  Service: Urology;  Laterality: Left;  Marland Kitchen VISCERAL ANGIOGRAPHY N/A 01/23/2019   Procedure: VISCERAL ANGIOGRAPHY;  Surgeon: Algernon Huxley, MD;  Location: [place] CV LAB;  Service: Cardiovascular;  Laterality: N/A;  . VISCERAL ANGIOGRAPHY N/A 06/09/2019   Procedure: VISCERAL ANGIOGRAPHY;  Surgeon: Algernon Huxley, MD;  Location: [place] CV LAB;  Service: Cardiovascular;  Laterality: N/A;  . VISCERAL ARTERY INTERVENTION N/A 02/12/2017   Procedure: VISCERAL ARTERY INTERVENTION;  Surgeon: Algernon Huxley, MD;  Location: [place] CV LAB;  Service: Cardiovascular;  Laterality: N/A;     Home Meds: Prior to Admission medications   Medication Sig Start Date End Date Taking? Authorizing Provider  amLODipine (NORVASC) 10 MG tablet Take 1 tablet (10 mg total) by mouth daily. 08/23/19  Yes Danford, Suann Larry, MD  aspirin EC 81 MG tablet Take 1 tablet (81 mg total) by mouth daily. 01/23/19  Yes Dew, Erskine Squibb, MD  cholecalciferol (VITAMIN D) 1000 units tablet Take 1,000 Units by mouth daily.   Yes [provider]  clopidogrel (PLAVIX) 75 MG tablet Take 1 tablet (75 mg total) by mouth daily. 03/29/18  Yes Einar Pheasant, MD  cyanocobalamin 1000 MCG tablet Take 1,000 mcg by mouth daily.    Yes [provider]  gabapentin (NEURONTIN) 100 MG capsule Take 200 mg by mouth at bedtime.  06/02/19  Yes [provider]  hydrALAZINE (APRESOLINE) 50 MG tablet Take 50  mg by mouth 3 (three) times daily.    Yes [provider]  labetalol (NORMODYNE) 200 MG tablet Take 1 tablet (200 mg total) by mouth 2 (two) times daily. Patient taking differently: Take 200 mg by mouth daily in the afternoon.  08/22/19  Yes Danford, Suann Larry, MD  losartan (COZAAR) 100 MG tablet Take 1 tablet (100 mg total) by mouth daily. 08/23/19  Yes Danford, Suann Larry, MD  lovastatin (MEVACOR) 40 MG tablet TAKE 1 TABLET BY MOUTH  DAILY IN THE EVENING Patient taking differently: Take 40 mg by mouth every evening.  06/25/19  Yes Einar Pheasant, MD  roflumilast (DALIRESP) 500 MCG TABS tablet Take 0.5 tablets (250 mcg total) by mouth daily. 08/29/19  Yes Max Sane, MD  SYMBICORT 160-4.5 MCG/ACT inhaler USE 2 PUFFS BY MOUTH TWO  TIMES DAILY Patient taking differently: Inhale 2 puffs into the lungs in the morning and at bedtime.  06/30/19  Yes Einar Pheasant, MD  albuterol (PROVENTIL HFA;VENTOLIN HFA) 108 (90 Base) MCG/ACT inhaler Inhale 2 puffs into the lungs every 6 (six) hours as needed for wheezing or shortness of breath. 10/22/17   Bettey Costa, MD  fluticasone (FLONASE) 50 MCG/ACT nasal spray USE 2 SPRAYS INTO BOTH  NOSTRILS DAILY AS NEEDED  FOR  ALLERGIES. Patient taking differently: Place 2 sprays into both nostrils daily.  10/01/18   Einar Pheasant, MD  ipratropium-albuterol (DUONEB) 0.5-2.5 (3) MG/3ML SOLN Take 3 mLs by nebulization every 6 (six) hours as needed (for shortness of breath). 11/14/14   Joanne Gavel, MD  loperamide (IMODIUM) 2 MG capsule Take 1 capsule (2 mg total) by mouth as needed for diarrhea or loose stools. 08/22/19   Danford, Suann Larry, MD  torsemide (DEMADEX) 20 MG tablet Take 1 tablet (20 mg total) by mouth daily as needed (swelling or shortness of breath). 08/06/19   Ezekiel Slocumb, DO    Inpatient Medications:  . aspirin EC  81 mg Oral Daily  . cholecalciferol  1,000 Units Oral Daily  . clopidogrel  75 mg Oral Daily  . diltiazem  240 mg Oral Daily   . fluticasone  2 spray Each Nare Daily  . furosemide  40 mg Intravenous Q12H  . gabapentin  200 mg Oral QHS  . heparin  5,000 Units Subcutaneous Q8H  . hydrALAZINE  50 mg Oral TID  . labetalol  200 mg Oral BID  . mometasone-formoterol  2 puff Inhalation BID  . pravastatin  40 mg Oral q1800  . roflumilast  250 mcg Oral Daily  . cyanocobalamin  1,000 mcg Oral Daily   . azithromycin (ZITHROMAX) 500 MG IVPB (Vial-Mate [employer]) Stopped (08/15/2019 1226)  . cefTRIAXone (ROCEPHIN)  IV Stopped (09/02/2019 1225)  . diltiazem (CARDIZEM) infusion 15 mg/hr (09/03/2019 0932)    Allergies:  Allergies  Allergen Reactions  . Evista [Raloxifene] Other (See Comments)    Night sweats   . Fluticasone-Salmeterol Other (See Comments)    Cough, "chokes me"     Social History   Socioeconomic History  . Marital status: Widowed    Spouse name: Not on file  . Number of children: 2  . Years of education: Not on file  . Highest education level: Not on file  Occupational History  . Not on file  Tobacco Use  . Smoking status: Former Smoker    Packs/day: 1.00    Years: 45.00    Pack years: 45.00    Types: Cigarettes    Quit date: 11/11/2003    Years since quitting: 15.8  . Smokeless tobacco: Never Used  Substance and Sexual Activity  . Alcohol use: Not Currently    Alcohol/week: 0.0 standard drinks  . Drug use: No  . Sexual activity: Never  Other Topics Concern  . Not on file  Social History Narrative  . Not on file   Social Determinants of Health   Financial Resource Strain:   . Difficulty of Paying Living Expenses:   Food Insecurity: No Food Insecurity  . Worried About Charity fundraiser in the Last Year: Never true  . Ran Out of Food in the Last Year: Never true  Transportation Needs: No Transportation Needs  . Lack of Transportation (Medical): No  . Lack of Transportation (Non-Medical): No  Physical Activity:   . Days of Exercise per Week:   . Minutes of Exercise per Session:    Stress:   . Feeling of Stress :   Social Connections:   . Frequency of Communication with Friends and Family:   . Frequency of Social Gatherings with Friends and Family:   . Attends Religious Services:   . Active Member of Clubs or Organizations:   . Attends Archivist Meetings:   Marland Kitchen Marital Status:   Intimate Partner Violence:   .  Fear of Current or Ex-Partner:   . Emotionally Abused:   Marland Kitchen Physically Abused:   . Sexually Abused:      Family History  Problem Relation Age of Onset  . Cirrhosis Father        died age 66  . Alcohol abuse Father   . Asthma Mother   . Congestive Heart Failure Mother   . Breast cancer Mother        2 (1/2 sisters)  . Osteoarthritis Mother   . Colon cancer Mother   . Lupus Sister   . Alcohol abuse Sister   . Ovarian cancer Sister   . Osteoporosis Sister   . Skin cancer Sister   . Breast cancer Cousin   . Breast cancer Maternal Aunt      Review of Systems Positive for shortness of breath palpitations Negative for: General:  chills, fever, night sweats or weight changes.  Cardiovascular: PND orthopnea syncope dizziness  Dermatological skin lesions rashes Respiratory: Cough congestion Urologic: Frequent urination urination at night and hematuria Abdominal: negative for nausea, vomiting, diarrhea, bright red blood per rectum, melena, or hematemesis Neurologic: negative for visual changes, and/or hearing changes  All other systems reviewed and are otherwise negative except as noted above.  Labs: No results for input(s): CKTOTAL, CKMB, TROPONINI in the last 72 hours. Lab Results  Component Value Date   WBC 21.6 (H) 09/10/2019   HGB 13.1 08/21/2019   HCT 38.5 08/15/2019   MCV 90.6 08/26/2019   PLT 245 08/26/2019    Recent Labs  Lab 08/28/19 1304 08/29/19 0442 09/02/2019 1026  NA 136   < > 138  K 4.7   < > 4.1  CL 98   < > 101  CO2 27   < > 19*  BUN 42*   < > 26*  CREATININE 1.81*   < > 1.50*  CALCIUM 9.2   < > 9.6  PROT  6.1*  --   --   BILITOT 1.3*  --   --   ALKPHOS 56  --   --   ALT 15  --   --   AST 13*  --   --   GLUCOSE 139*   < > 189*   < > = values in this interval not displayed.   Lab Results  Component Value Date   CHOL 161 03/21/2019   HDL 67.50 03/21/2019   LDLCALC 76 03/21/2019   TRIG 89.0 03/21/2019   No results found for: DDIMER  Radiology/Studies:  DG Chest 2 View  Result Date: 08/13/2019 CLINICAL DATA:  Shortness of breath EXAM: CHEST - 2 VIEW COMPARISON:  08/02/2019 FINDINGS: The heart size and mediastinal contours are stable. Atherosclerotic calcification of the aortic knob. Moderate left pleural effusion, increased from prior. Hyper expanded lungs with chronically coarsened interstitial markings. No pneumothorax. Exaggerated thoracic kyphosis. IMPRESSION: 1. Moderate left pleural effusion, increased from prior. 2. COPD. Electronically Signed   By: Davina Poke D.O.   On: 08/13/2019 13:16   DG Chest 2 View  Result Date: 08/02/2019 CLINICAL DATA:  increasing shortness of breath and weakness over last 2-3 weeks. PT states she in unable to climb her stairs at home as normal. States she has noted a drop in exertional O2 sats to low 90s. EXAM: CHEST - 2 VIEW COMPARISON:  Chest radiograph 10/20/2017 FINDINGS: The heart size and mediastinal contours are within normal limits. Aortic arch calcification. Lungs are hyperinflated chronic bilateral bronchial wall thickening. There is a small left pleural  effusion and mild bibasilar opacities which could reflect scarring or atelectasis. No pneumothorax. No acute finding in the visualized skeleton. Both lungs are clear. The visualized skeletal structures are unremarkable. IMPRESSION: Small left pleural effusion and mild bibasilar opacities favored to represent scarring or atelectasis. COPD and chronic bronchitic change. Electronically Signed   By: Audie Pinto M.D.   On: 08/02/2019 13:53   US RENAL  Result Date: 08/05/2019 CLINICAL DATA:   Acute kidney injury. EXAM: RENAL / URINARY TRACT ULTRASOUND COMPLETE COMPARISON:  04/22/2019 FINDINGS: Right Kidney: Renal measurements: 9.6 x 3.6 x 4.1 cm = volume: 74.4 mL. No hydronephrosis. Echogenicity normal. There is a upper pole renal sinus cyst measuring 2 x 1.9 x 1.7 cm. Left Kidney: Renal measurements: 9.3 x 3.9 x 4.1 cm = volume: 77.0 mL. Large exophytic cyst arising from the lateral cortex of the left mid kidney measures 6.6 x 5.5 x 6.0 cm. No hydronephrosis. Normal echogenicity. Bladder: Appears normal for degree of bladder distention. Other: None. IMPRESSION: 1. No acute findings.  No hydronephrosis. 2. Bilateral kidney cysts. Electronically Signed   By: Kerby Moors M.D.   On: 08/05/2019 11:09   DG Chest Port 1 View  Result Date: 08/18/2019 CLINICAL DATA:  Shortness of breath and tachycardia. EXAM: PORTABLE CHEST 1 VIEW COMPARISON:  08/28/2019 FINDINGS: External pacer paddles are noted. The cardiac silhouette, mediastinal and hilar contours are within normal limits and stable. Stable tortuosity and calcification of the thoracic aorta. Moderate central vascular congestion and interstitial pulmonary edema along with a persistent left pleural effusion and left lower lobe atelectasis. No pneumothorax. The bony thorax is intact. IMPRESSION: CHF with left pleural effusion and left lower lobe atelectasis. Electronically Signed   By: Marijo Sanes M.D.   On: 09/07/2019 07:19   DG Chest Portable 1 View  Result Date: 08/28/2019 CLINICAL DATA:  Shortness of breath. EXAM: PORTABLE CHEST 1 VIEW COMPARISON:  August 16, 2019. FINDINGS: Stable cardiomegaly. No pneumothorax is noted. Right lung is clear. Mild left basilar atelectasis or infiltrate is noted with associated effusion. Bony thorax is unremarkable. IMPRESSION: Mild left basilar atelectasis or infiltrate is noted with associated pleural effusion. Electronically Signed   By: Marijo Conception M.D.   On: 08/28/2019 13:24   DG Chest Port 1  View  Result Date: 08/16/2019 CLINICAL DATA:  Dyspnea EXAM: PORTABLE CHEST 1 VIEW COMPARISON:  08/02/2019 FINDINGS: Increased small to moderate left pleural effusion and left basilar atelectasis. Similar small right pleural effusion. Persistent interstitial prominence likely reflecting mild edema superimposed chronic changes. Similar cardiomediastinal contours. IMPRESSION: Increased small to moderate left pleural effusion with associated left basilar atelectasis. Similar small right pleural effusion. Persistent probable mild pulmonary edema superimposed on chronic interstitial prominence. Electronically Signed   By: Macy Mis M.D.   On: 08/16/2019 16:28   DG Chest Port 1 View  Result Date: 08/14/2019 CLINICAL DATA:  85 year old female status post thoracentesis EXAM: PORTABLE CHEST 1 VIEW COMPARISON:  08/13/2019 FINDINGS: Cardiomediastinal silhouette unchanged in size and contour. Evidence of prior PTCA. Mild interlobular septal thickening, seems increased from the prior. Coarsened interstitial markings persist from the prior. Decreased opacity at the left lung base with persistent blunting at the left costophrenic angle. No pneumothorax. Minimal blunting at the right costophrenic angle. IMPRESSION: Improved left-sided pleural effusion status post thoracentesis. No pneumothorax. New trace right-sided pleural fluid. Early pulmonary edema. Electronically Signed   By: Corrie Mckusick D.O.   On: 08/14/2019 15:34   US RENAL ARTERY DUPLEX COMPLETE  Result  Date: 08/19/2019 CLINICAL DATA:  85 year old female with a history of hypertension EXAM: RENAL/URINARY TRACT ULTRASOUND RENAL DUPLEX DOPPLER ULTRASOUND COMPARISON:  None. FINDINGS: Right Kidney: Length: 9.1 cm. Echogenicity within normal limits. No hydronephrosis. Focal hypoechoic region of the lateral cortex on the right kidney measures estimated 3.1 cm x 1.8 cm. Recent CT dated 04/22/2019 demonstrates no correlate, and this is a most likely artifactual  Anechoic cyst on the lateral cortex, inferior pole right kidney measures less than 2 cm, compatible with Bosniak 1 cyst. Left Kidney: Length: 9.7 cm. Echogenicity similar to the contralateral kidney. No hydronephrosis. Anechoic cyst on the left kidney cortex compatible with Bosniak 1 cyst. 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 Bilateral Bosniak 1 cysts. Signed, Dulcy Fanny. Dellia Nims, RPVI [facility] Radiology Electronically Signed   By: Corrie Mckusick D.O.   On: 08/19/2019 12:13   ECHOCARDIOGRAM COMPLETE  Result Date: 08/03/2019    ECHOCARDIOGRAM REPORT   Patient Name:   Tina [name] Date of Exam: 08/03/2019 Medical Rec #:  [id number]     Height:       65.0 in Accession #:    [id number]    Weight:       135.1 lb Date of Birth:  [birth year]     BSA:          1.674 m Patient Age:    70 years      BP:           183/76 mmHg Patient Gender: F             HR:           81 bpm. Exam Location:  [facility] Procedure: 2D Echo Indications:     CHF 428.31/ I50.31  History:         Patient has prior history of Echocardiogram examinations, most                  recent 10/20/2017.  Sonographer:     Arville Go RDCS Referring Phys:  [id number] AGBATA Diagnosing Phys: Yolonda Kida MD IMPRESSIONS  1. Left ventricular ejection fraction, by estimation, is 50 to 55%. The left ventricle has low normal function. The left ventricle  demonstrates global hypokinesis. Left ventricular diastolic parameters are consistent with Grade I diastolic dysfunction (impaired relaxation).  2. Right ventricular systolic function is normal. The right ventricular size is normal.  3. The mitral valve is grossly normal. Trivial mitral valve regurgitation.  4. The aortic valve is grossly normal. Aortic valve regurgitation is not visualized. FINDINGS  Left Ventricle: Left ventricular ejection fraction, by estimation, is 50 to 55%. The left ventricle has low normal function. The left ventricle demonstrates global hypokinesis. The left ventricular internal cavity size was normal in size. There is no left ventricular hypertrophy. Left ventricular diastolic parameters are consistent with Grade I diastolic dysfunction (impaired relaxation). Right Ventricle: The right ventricular size is normal. No increase in right ventricular  wall thickness. Right ventricular systolic function is normal. Left Atrium: Left atrial size was normal in size. Right Atrium: Right atrial size was normal in size. Pericardium: There is no evidence of pericardial effusion. Mitral Valve: The mitral valve is grossly normal. Trivial mitral valve regurgitation. Tricuspid Valve: The tricuspid valve is normal in structure. Tricuspid valve regurgitation is mild. Aortic Valve: The aortic valve is grossly normal. Aortic valve regurgitation is not visualized. Aortic valve peak gradient measures 7.0 mmHg. Pulmonic Valve: The pulmonic valve was normal in structure. Pulmonic valve regurgitation is mild. Aorta: The aortic root is normal in size and structure. IAS/Shunts: The atrial septum is grossly normal.  LEFT VENTRICLE PLAX 2D LVIDd:         4.96 cm     Diastology LVIDs:         3.99 cm     LV e' lateral:   5.87 cm/s LV PW:         1.03 cm     LV E/e' lateral: 15.3 LV IVS:        0.91 cm     LV e' medial:    5.87 cm/s LVOT diam:     1.70 cm     LV E/e' medial:  15.3 LV SV:         49 LV SV Index:   29 LVOT  Area:     2.27 cm  LV Volumes (MOD) LV vol d, MOD A4C: 44.2 ml LV vol s, MOD A4C: 19.3 ml LV SV MOD A4C:     44.2 ml RIGHT VENTRICLE RV Basal diam:  1.90 cm RV S prime:     14.90 cm/s TAPSE (M-mode): 2.5 cm LEFT ATRIUM             Index       RIGHT ATRIUM           Index LA diam:        3.70 cm 2.21 cm/m  RA Area:     11.10 cm LA Vol (A2C):   31.0 ml 18.51 ml/m RA Volume:   22.20 ml  13.26 ml/m LA Vol (A4C):   14.9 ml 8.90 ml/m LA Biplane Vol: 22.9 ml 13.68 ml/m  AORTIC VALVE                PULMONIC VALVE AV Area (Vmax): 1.51 cm    PV Vmax:       1.06 m/s AV Vmax:        132.00 cm/s PV Peak grad:  4.5 mmHg AV Peak Grad:   7.0 mmHg LVOT Vmax:      87.90 cm/s LVOT Vmean:     52.800 cm/s LVOT VTI:       0.215 m  AORTA Ao Root diam: 2.60 cm MITRAL VALVE               TRICUSPID VALVE MV Area (PHT): 3.27 cm    TV Peak grad:   29.2 mmHg MV Decel Time: 232 msec    TV Vmax:        2.70 m/s MV E velocity: 89.70 cm/s MV A velocity: 96.00 cm/s  SHUNTS MV E/A ratio:  0.93        Systemic VTI:  0.22 m                            Systemic Diam: 1.70 cm Yolonda Kida MD Electronically signed by Yolonda Kida MD Signature  Date/Time: 08/03/2019/3:38:16 PM    Final    US THORACENTESIS ASP PLEURAL SPACE W/IMG GUIDE  Result Date: 08/14/2019 INDICATION: 85 year old female with a history of left-sided pleural effusion EXAM: ULTRASOUND GUIDED LEFT THORACENTESIS MEDICATIONS: None. COMPLICATIONS: None PROCEDURE: An ultrasound guided thoracentesis was thoroughly discussed with the patient and questions answered. The benefits, risks, alternatives and complications were also discussed. The patient understands and wishes to proceed with the procedure. Written consent was obtained. Ultrasound was performed to localize and mark an adequate pocket of fluid in the left chest. The area was then prepped and draped in the normal sterile fashion. 1% Lidocaine was used for local anesthesia. Under ultrasound guidance a 8 Fr  Safe-T-Centesis catheter was introduced. Thoracentesis was performed. The catheter was removed and a dressing applied. FINDINGS: A total of approximately 300 cc of thin yellow fluid was removed. The blood-tinged fluid is present secondary to contamination from the initial drain placement. Samples were sent to the laboratory as requested by the clinical team. IMPRESSION: Status post ultrasound-guided left-sided thoracentesis Signed, Dulcy Fanny. Dellia Nims, RPVI [facility] Radiology Electronically Signed   By: Corrie Mckusick D.O.   On: 08/14/2019 15:31    EKG: Atrial fibrillation with rapid ventricular rate nonspecific ST changes  Weights: Filed Weights   08/20/2019 0620  Weight: 60 kg     Physical Exam: Blood pressure (!) 161/86, pulse (!) 111, temperature 97.8 F (36.6 C), resp. rate (!) 24, height _0  (1.651 m), weight 60 kg, SpO2 96 %. Body mass index is 22.01 kg/m. General: Well developed, well nourished, in no acute distress. Head eyes ears nose throat: Normocephalic, atraumatic, sclera non-icteric, no xanthomas, nares are without discharge. No apparent thyromegaly and/or mass  Lungs: Normal respiratory effort.  You wheezes, some rales, no rhonchi.  Heart: Irregular with normal S1 S2. no murmur gallop, no rub, PMI is normal size and placement, carotid upstroke normal without bruit, jugular venous pressure is normal Abdomen: Soft, non-tender, non-distended with normoactive bowel sounds. No hepatomegaly. No rebound/guarding. No obvious abdominal masses. Abdominal aorta is normal size without bruit Extremities: Trace edema. no cyanosis, no clubbing, no ulcers  Peripheral : 2+ bilateral upper extremity pulses, 2+ bilateral femoral pulses, 2+ bilateral dorsal pedal pulse Neuro: Alert and oriented. No facial asymmetry. No focal deficit. Moves all extremities spontaneously. Musculoskeletal: Normal muscle tone without kyphosis Psych:  Responds to  questions appropriately with a normal affect.    Assessment: 85 year old female with chronic kidney disease hypertension hyperlipidemia coronary atherosclerosis with acute on chronic diastolic dysfunction congestive heart failure with elevated troponin consistent with demand ischemia secondary to atrial fibrillation with rapid ventricular rate  Plan: 1.  Continue diltiazem drip for better heart rate control of atrial fibrillation and addition of oral diltiazem in addition to previously taking labetalol 2.  Anticoagulation with heparin for further risk reduction in stroke with atrial fibrillation and potentially change over to Eliquis 2.5 mg twice per day 3.  Echocardiogram for LV systolic dysfunction valvular heart disease contributing to above 4.  Other medication management for hypertension control as before 5.  Would consider intravenous Lasix if necessary for pulmonary edema and acute on chron diastolic dysfunction congestive heart failure 6.  Further treatment options after above  Signed, Corey Skains M.D. [facility] 09/04/2019, 12:39 PM

## 2019-09-01 NOTE — Progress Notes (Signed)
ANTICOAGULATION CONSULT NOTE - Initial Consult  Pharmacy Consult for Heparin drip  Indication: atrial fibrillation  Allergies  Allergen Reactions  . Evista [Raloxifene] Other (See Comments)    Night sweats   . Fluticasone-Salmeterol Other (See Comments)    Cough, "chokes me"     Patient Measurements: Height: '5\' 5"'  (165.1 cm) Weight: 55.7 kg (122 lb 11.2 oz) IBW/kg (Calculated) : 57 Heparin Dosing Weight: 55.7 kg  Vital Signs: Temp: 98.2 F (36.8 C) (04/19 1939) Temp Source: Oral (04/19 1939) BP: 189/78 (04/19 1939) Pulse Rate: 121 (04/19 1939)  Labs: Recent Labs    08/26/2019 0631 09/02/2019 1026  HGB 13.1  --   HCT 38.5  --   PLT 245  --   CREATININE  --  1.50*  TROPONINIHS 40* 39*    Estimated Creatinine Clearance: 25.4 mL/min (A) (by C-G formula based on SCr of 1.5 mg/dL (H)).   Medical History: Past Medical History:  Diagnosis Date  . Anemia   . Asthma   . Bladder cancer ([place])   . BRCA positive   . CAD (coronary artery disease)   . CHF (congestive heart failure) ([place])   . COPD (chronic obstructive pulmonary disease) ([place])   . Emphysema lung ([place])   . GERD (gastroesophageal reflux disease)   . History of colon polyps   . Hypercholesterolemia   . Hyperglycemia   . Hypertension   . Lung nodules   . Osteoporosis   . Personal history of tobacco use, presenting hazards to health 11/25/2014  . Polycythemia vera(238.4)   . Renal cyst   . Skin cancer     Medications:  Scheduled:  . aspirin EC  81 mg Oral Daily  . cholecalciferol  1,000 Units Oral Daily  . clopidogrel  75 mg Oral Daily  . diltiazem  240 mg Oral Daily  . furosemide  40 mg Intravenous Q12H  . gabapentin  200 mg Oral QHS  . heparin  2,800 Units Intravenous Once  . hydrALAZINE  50 mg Oral TID  . labetalol  200 mg Oral BID  . mometasone-formoterol  2 puff Inhalation BID  . pravastatin  40 mg Oral q1800  . roflumilast  250 mcg Oral Daily  . cyanocobalamin  1,000 mcg Oral Daily    Infusions:  . sodium chloride 250 mL (08/14/2019 2003)  . azithromycin (ZITHROMAX) 500 MG IVPB (Vial-Mate [employer]) Stopped (09/03/2019 1226)  . cefTRIAXone (ROCEPHIN)  IV Stopped (08/29/2019 1225)  . diltiazem (CARDIZEM) infusion 15 mg/hr (08/24/2019 2017)  . heparin      Assessment: 85 yo F to start Heparin drip for Afib. On ASA, plavix PTA Hgb 13.1  Plt 245  INR pending  APTT pending   Goal of Therapy:  Heparin level 0.3-0.7 units/ml Monitor platelets by anticoagulation protocol: Yes   Plan:  Give 2800 units bolus x 1 Start heparin infusion at 850 units/hr Check anti-Xa level in 8 hours and daily while on heparin Continue to monitor H&H and platelets  Tina [name] A 09/11/2019,8:25 PM

## 2019-09-02 ENCOUNTER — Inpatient Hospital Stay
Admit: 2019-09-02 | Discharge: 2019-09-02 | Disposition: A | Discharge status: Home / Self Care | Payer: Medicare Other | Attending: Internal Medicine | Admitting: Internal Medicine

## 2019-09-02 DIAGNOSIS — J9621 Acute and chronic respiratory failure with hypoxia: Secondary | ICD-10-CM | POA: Diagnosis not present

## 2019-09-02 DIAGNOSIS — J441 Chronic obstructive pulmonary disease with (acute) exacerbation: Secondary | ICD-10-CM | POA: Diagnosis not present

## 2019-09-02 DIAGNOSIS — E875 Hyperkalemia: Secondary | ICD-10-CM

## 2019-09-02 DIAGNOSIS — I5033 Acute on chronic diastolic (congestive) heart failure: Secondary | ICD-10-CM | POA: Diagnosis not present

## 2019-09-02 DIAGNOSIS — N179 Acute kidney failure, unspecified: Secondary | ICD-10-CM

## 2019-09-02 DIAGNOSIS — N1832 Chronic kidney disease, stage 3b: Secondary | ICD-10-CM

## 2019-09-02 LAB — BASIC METABOLIC PANEL
Anion gap: 16 — ABNORMAL HIGH (ref 5–15)
BUN: 43 mg/dL — ABNORMAL HIGH (ref 8–23)
CO2: 22 mmol/L (ref 22–32)
Calcium: 9.2 mg/dL (ref 8.9–10.3)
Chloride: 100 mmol/L (ref 98–111)
Creatinine, Ser: 2.3 mg/dL — ABNORMAL HIGH (ref 0.44–1.00)
GFR calc Af Amer: 22 mL/min — ABNORMAL LOW (ref >60)
GFR calc non Af Amer: 19 mL/min — ABNORMAL LOW (ref >60)
Glucose, Bld: 131 mg/dL — ABNORMAL HIGH (ref 70–99)
Potassium: 5.2 mmol/L — ABNORMAL HIGH (ref 3.5–5.1)
Sodium: 138 mmol/L (ref 135–145)

## 2019-09-02 LAB — CBC
HCT: 34.4 % — ABNORMAL LOW (ref 36.0–46.0)
Hemoglobin: 11.6 g/dL — ABNORMAL LOW (ref 12.0–15.0)
MCH: 30.9 pg (ref 26.0–34.0)
MCHC: 33.7 g/dL (ref 30.0–36.0)
MCV: 91.7 fL (ref 80.0–100.0)
Platelets: 211 10*3/uL (ref 150–400)
RBC: 3.75 MIL/uL — ABNORMAL LOW (ref 3.87–5.11)
RDW: 14.3 % (ref 11.5–15.5)
WBC: 15.8 10*3/uL — ABNORMAL HIGH (ref 4.0–10.5)
nRBC: 0.1 % (ref 0.0–0.2)

## 2019-09-02 LAB — HEPARIN LEVEL (UNFRACTIONATED)
Heparin Unfractionated: 0.1 IU/mL — ABNORMAL LOW (ref 0.30–0.70)
Heparin Unfractionated: <0.1 IU/mL — ABNORMAL LOW (ref 0.30–0.70)

## 2019-09-02 MED ORDER — LORAZEPAM 2 MG/ML IJ SOLN
1.0000 mg | Freq: Once | INTRAMUSCULAR | Status: AC
  Administered 2019-09-02: 1 mg via INTRAVENOUS
  Filled 2019-09-02: qty 1

## 2019-09-02 MED ORDER — METHYLPREDNISOLONE SODIUM SUCC 40 MG IJ SOLR
40.0000 mg | Freq: Two times a day (BID) | INTRAMUSCULAR | Status: DC
  Administered 2019-09-02 – 2019-09-03 (×3): 40 mg via INTRAVENOUS
  Filled 2019-09-02 (×4): qty 1

## 2019-09-02 MED ORDER — MORPHINE SULFATE (PF) 2 MG/ML IV SOLN
2.0000 mg | INTRAVENOUS | Status: DC | PRN
  Administered 2019-09-04: 2 mg via INTRAVENOUS
  Filled 2019-09-02: qty 1

## 2019-09-02 MED ORDER — APIXABAN 2.5 MG PO TABS
2.5000 mg | ORAL_TABLET | Freq: Two times a day (BID) | ORAL | Status: DC
  Administered 2019-09-02 – 2019-09-03 (×3): 2.5 mg via ORAL
  Filled 2019-09-02 (×4): qty 1

## 2019-09-02 MED ORDER — HEPARIN BOLUS VIA INFUSION
1650.0000 [IU] | Freq: Once | INTRAVENOUS | Status: AC
  Administered 2019-09-02: 17:00:00 1650 [IU] via INTRAVENOUS
  Filled 2019-09-02: qty 1650

## 2019-09-02 MED ORDER — HEPARIN BOLUS VIA INFUSION
1600.0000 [IU] | Freq: Once | INTRAVENOUS | Status: AC
  Administered 2019-09-02: 1600 [IU] via INTRAVENOUS
  Filled 2019-09-02: qty 1600

## 2019-09-02 MED ORDER — SODIUM CHLORIDE 0.9% FLUSH
10.0000 mL | Freq: Two times a day (BID) | INTRAVENOUS | Status: DC
  Administered 2019-09-02 – 2019-09-03 (×3): 10 mL via INTRAVENOUS

## 2019-09-02 NOTE — Progress Notes (Signed)
RN called RT to patient bedside, patient c/o not being able to breathe well. Patient on Bipap with sufficient Tidal Volumes and SATs at 98% on 40% FIO2. Patient tachypneic with diminished bilateral breath sounds, fine crackles at bases, no accessory muscle use. Bipap settings adjusted, PRN breathing treatment given. Patient respirations improved. Patient resting comfortably in bed, will continue to monitor.

## 2019-09-02 NOTE — Progress Notes (Signed)
Patient was admitted at 5. Patient was in afib with RVR, Heart rate in the 140's. Patient was dyspneic and tachpniec. {atoemt was requesting to be placed back on Bipap. Notified respiratory therapist for assesment. Notified [place] NP for further orders. Patient was placed back on a cardizem drip and bipap. Patient profile completed by [facility] nurse.

## 2019-09-02 NOTE — Progress Notes (Signed)
ANTICOAGULATION CONSULT NOTE - Initial Consult  Pharmacy Consult for Heparin drip  Indication: atrial fibrillation  Allergies  Allergen Reactions  . Evista [Raloxifene] Other (See Comments)    Night sweats   . Fluticasone-Salmeterol Other (See Comments)    Cough, "chokes me"     Patient Measurements: Height: '5\' 5"'  (165.1 cm) Weight: (bed rest. also bed not zeroed. nurse notified.) IBW/kg (Calculated) : 57 Heparin Dosing Weight: 55.7 kg  Vital Signs: Temp: 97.6 F (36.4 C) (04/20 0401) Temp Source: Axillary (04/20 0401) BP: 127/60 (04/20 0500) Pulse Rate: 59 (04/20 0500)  Labs: Recent Labs    08/30/2019 0631 09/07/2019 1026 08/30/2019 2039 09/02/19 0447  HGB 13.1  --   --  11.6*  HCT 38.5  --   --  34.4*  PLT 245  --   --  211  APTT  --   --  30  --   LABPROT  --   --  13.7  --   INR  --   --  1.1  --   HEPARINUNFRC  --   --   --  0.10*  CREATININE  --  1.50*  --  2.30*  TROPONINIHS 40* 39* 37*  --     Estimated Creatinine Clearance: 16.6 mL/min (A) (by C-G formula based on SCr of 2.3 mg/dL (H)).   Medical History: Past Medical History:  Diagnosis Date  . Anemia   . Asthma   . Bladder cancer ([place])   . BRCA positive   . CAD (coronary artery disease)   . CHF (congestive heart failure) ([place])   . COPD (chronic obstructive pulmonary disease) ([place])   . Emphysema lung ([place])   . GERD (gastroesophageal reflux disease)   . History of colon polyps   . Hypercholesterolemia   . Hyperglycemia   . Hypertension   . Lung nodules   . Osteoporosis   . Personal history of tobacco use, presenting hazards to health 11/25/2014  . Polycythemia vera(238.4)   . Renal cyst   . Skin cancer     Medications:  Scheduled:  . aspirin EC  81 mg Oral Daily  . cholecalciferol  1,000 Units Oral Daily  . clopidogrel  75 mg Oral Daily  . diltiazem  240 mg Oral Daily  . furosemide  40 mg Intravenous Q12H  . gabapentin  200 mg Oral QHS  . heparin  1,600 Units Intravenous Once  .  hydrALAZINE  50 mg Oral TID  . labetalol  200 mg Oral BID  . mometasone-formoterol  2 puff Inhalation BID  . pravastatin  40 mg Oral q1800  . roflumilast  250 mcg Oral Daily  . cyanocobalamin  1,000 mcg Oral Daily   Infusions:  . sodium chloride 250 mL (09/10/2019 2003)  . diltiazem (CARDIZEM) infusion Stopped (09/02/19 0109)  . heparin 850 Units/hr (08/19/2019 2051)    Assessment: 85 yo F to start Heparin drip for Afib. On ASA, plavix PTA Hgb 13.1  Plt 245  INR pending  APTT pending   Goal of Therapy:  Heparin level 0.3-0.7 units/ml Monitor platelets by anticoagulation protocol: Yes   Plan:  04/20 @ 0500 HL 0.10 subtherapeutic. Will rebolus heparin 1600 units IV x 1 and increase rate to 1000 units/hr and will recheck HL at 1400 and will continue to monitor.  Tobie Lords, PharmD, BCPS Clinical Pharmacist 09/02/2019,6:06 AM

## 2019-09-02 NOTE — Progress Notes (Signed)
Manufacturing engineer [facility]) Hospital Liaison RN note  Pt is currently followed by Manufacturing engineer for outpatient palliative care services.  Will follow for disposition.  Please call with any questions or concerns.  Thank you. Margaretmary Eddy, BSN, RN [facility] Liaison [phone]

## 2019-09-02 NOTE — TOC Initial Note (Signed)
Transition of Care [facility]) - Initial/Assessment Note    Patient Details  Name: Tina [name] MRN: [id number] Date of Birth: August 10, 1934  Transition of [facility]) CM/SW Contact:    Victorino Dike, RN Phone Number: 09/02/2019, 8:58 AM  Clinical Narrative:      HIgh Risk Assessment completed.  Patient lives with son.  Patient is [facility] with [facility] with [place].  Patient uses a mail order pharmacy but it needed, she uses [facility].  Current DME in home [employer], Wheelchair and bedside commode.    Patient has a PCP   Dr Nicki Reaper.   Expected Discharge Plan: [place] Barriers to Discharge: Continued Medical Work up   Patient Goals and CMS Choice        Expected Discharge Plan and Services Expected Discharge Plan: [place]   Discharge Planning Services: CM Consult Post Acute Care Choice: Durable Medical Equipment, Home Health Living arrangements for the past 2 months: Single Family Home(has had recent SNF admit, but refused readmit and dc home with [facility] 4 days ago)                                      Prior Living Arrangements/Services Living arrangements for the past 2 months: Single Family Home(has had recent SNF admit, but refused readmit and dc home with [facility] 4 days ago) Lives with:: Adult Children Patient language and need for interpreter reviewed:: Yes Do you feel safe going back to the place where you live?: Yes      Need for Family Participation in Patient Care: Yes (Comment) Care giver support system in place?: Yes (comment) Current home services: DME((walker, wheelchair and bedside commode) Criminal Activity/Legal Involvement Pertinent to Current Situation/Hospitalization: No - Comment as needed  Activities of Daily Living Home Assistive Devices/Equipment: Walker (specify type), Eyeglasses ADL Screening (condition at time of admission) Patient's cognitive ability adequate to safely complete  daily activities?: Yes Is the patient deaf or have difficulty hearing?: No Does the patient have difficulty seeing, even when wearing glasses/contacts?: No Does the patient have difficulty concentrating, remembering, or making decisions?: No Patient able to express need for assistance with ADLs?: Yes Does the patient have difficulty dressing or bathing?: No Independently performs ADLs?: Yes (appropriate for developmental age) Does the patient have difficulty walking or climbing stairs?: Yes Weakness of Legs: None Weakness of Arms/Hands: None  Permission Sought/Granted                  Emotional Assessment Appearance:: Appears stated age Attitude/Demeanor/Rapport: Crying, Engaged Affect (typically observed): Accepting Orientation: : Oriented to Self, Oriented to Place, Oriented to  Time, Oriented to Situation Alcohol / Substance Use: Not Applicable Psych Involvement: No (comment)  Admission diagnosis:  COPD exacerbation (HCC) [J44.1] Rapid atrial fibrillation (HCC) [I48.91] Atrial fibrillation with rapid ventricular response ([place]) [I48.91] Patient Active Problem List   Diagnosis Date Noted  . Atrial fibrillation with RVR ([place]) 08/27/2019  . Rapid atrial fibrillation ([place]) 09/12/2019  . CKD (chronic kidney disease), stage IV ([place]) 08/19/2019  . Acute renal failure (ARF) ([place]) 08/28/2019  . Palliative care by specialist   . DNR (do not resuscitate) discussion   . Pleural effusion, left 08/17/2019  . Peripheral neuropathy 08/17/2019  . History of pulmonary embolism 08/17/2019  . Dyspnea 08/13/2019  . COPD with acute exacerbation ([place]) 08/02/2019  . Acute  on chronic diastolic CHF (congestive heart failure) ([place]) 08/02/2019  . Abnormality of gait 08/02/2019  . Renal artery stenosis ([place]) 07/01/2019  . DOE (dyspnea on exertion) 05/25/2019  . Diarrhea 05/10/2019  . AKI (acute kidney injury) ([place])   . Hypokalemia   . Foot drop 04/02/2019  . Low back pain 03/16/2019  . Leg  pain 03/16/2019  . Aortic atherosclerosis ([place]) 01/07/2019  . Hoarseness 06/23/2018  . Unsteadiness 05/11/2018  . CHF (congestive heart failure) ([place]) 10/20/2017  . Pulmonary embolus ([place]) 06/28/2017  . 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 Goals of care, counseling/discussion 03/02/2017  . Abnormal computed tomography of gastrointestinal tract   . Noninfectious diarrhea   . Ulceration of intestine   . Hernia, hiatal   . Bladder mass   . Mesenteric ischemia due to arterial insufficiency ([place])   . Polycythemia vera ([place]) 10/21/2016  . Elevated troponin 06/14/2016  . CKD (chronic kidney disease), stage III ([place]) 06/14/2016  . Leukocytosis 06/14/2016  . Abdominal bruit 04/16/2016  . Abnormal mammogram 06/06/2015  . Essential hypertension 02/16/2015  . Numbness 01/24/2015  . Personal history of tobacco use, presenting hazards to health 11/25/2014  . BRCA positive   . COPD exacerbation ([place]) 11/14/2014  . Sensation of pressure in bladder area 08/15/2014  . Health care maintenance 08/15/2014  . Presence of coronary angioplasty implant and graft 09/30/2013  . History of colonic polyps 08/25/2012  . Female genuine stress incontinence 06/28/2012  . Infection of urinary tract 06/28/2012  . CAD (coronary artery disease) 05/23/2012  . COPD, severe ([place]) 05/23/2012  . Hypercholesterolemia 05/23/2012  . Hyperglycemia 05/23/2012  . Polycythemia, secondary 05/23/2012  . Renal cyst 05/23/2012  . Osteoporosis 05/23/2012   PCP:  Einar Pheasant, MD Pharmacy:   [place], [place] [address] [place] [address] Phone: [phone] Fax: [place], [place], [place] [address] Phone: [phone] Fax: [place], [place] - [address] [place] [address] Phone: [phone] Fax:  [phone]     Social Determinants of Health ([place]) Interventions    Readmission Risk Interventions Readmission Risk Prevention Plan 08/15/2019 08/14/2019  Transportation Screening - Complete  PCP or Specialist Appt within 3-5 Days - Complete  HRI or Home Care Consult Complete (No Data)  Social Work Consult for [place] Planning/Counseling - Complete  Palliative Care Screening - Not Applicable  Medication Review Press photographer) - Complete  Some recent data might be hidden

## 2019-09-02 NOTE — Progress Notes (Signed)
Patient became confused this shift, removing bipap and attempting to get out of bed. Patient's blood pressure has improved and heart rate is stable. Patient is Afib on the monitor. Patient has had minimal urine output, bladder scan revealed 7cc.

## 2019-09-02 NOTE — Progress Notes (Signed)
[facility] Encounter Note  Patient: Tina [name] / Admit Date: 08/20/2019 / Date of Encounter: 09/02/2019, 1:38 PM   Subjective: Patient still somewhat short of breath weak and fatigued multifactorial in nature including pulmonary issues with resolving acute on chronic diastolic dysfunction heart failure.  Heart rate much better controlled at this time with oral diltiazem at 240 mg and labetalol.  Patient is on heparin at this time without any other significant issues and will potentially need change to Eliquis for atrial fibrillation stroke risk reduction.  There is no evidence of myocardial infarction at this time needing further intervention  Review of Systems: Positive for: Shortness of breath Negative for: Vision change, hearing change, syncope, dizziness, nausea, vomiting,diarrhea, bloody stool, stomach pain, cough, congestion, diaphoresis, urinary frequency, urinary pain,skin lesions, skin rashes Others previously listed  Objective: Telemetry: Atrial fibrillation with controlled ventricular rate Physical Exam: Blood pressure (!) 115/58, pulse 71, temperature 98.2 F (36.8 C), temperature source Oral, resp. rate 20, height 5\' 5"  (1.651 m), weight 55.7 kg, SpO2 95 %. Body mass index is 20.42 kg/m. General: Well developed, well nourished, in no acute distress. Head: Normocephalic, atraumatic, sclera non-icteric, no xanthomas, nares are without discharge. Neck: No apparent masses Lungs: Normal respirations with some wheezes, few rhonchi, no rales , no crackles   Heart: Irregular rate and rhythm, normal S1 S2, no murmur, no rub, no gallop, PMI is normal size and placement, carotid upstroke normal without bruit, jugular venous pressure normal Abdomen: Soft, non-tender, non-distended with normoactive bowel sounds. No hepatosplenomegaly. Abdominal aorta is normal size without bruit Extremities: Trace edema, no clubbing, no cyanosis, no ulcers,  Peripheral: 2+ radial,  2+ femoral, 2+ dorsal pedal pulses Neuro: Alert and oriented. Moves all extremities spontaneously. Psych:  Responds to questions appropriately with a normal affect.   Intake/Output Summary (Last 24 hours) at 09/02/2019 1338 Last data filed at 09/02/2019 1220 Gross per 24 hour  Intake 932.69 ml  Output 150 ml  Net 782.69 ml    Inpatient Medications:  . aspirin EC  81 mg Oral Daily  . cholecalciferol  1,000 Units Oral Daily  . clopidogrel  75 mg Oral Daily  . diltiazem  240 mg Oral Daily  . furosemide  40 mg Intravenous Q12H  . gabapentin  200 mg Oral QHS  . hydrALAZINE  50 mg Oral TID  . labetalol  200 mg Oral BID  . mometasone-formoterol  2 puff Inhalation BID  . pravastatin  40 mg Oral q1800  . roflumilast  250 mcg Oral Daily  . cyanocobalamin  1,000 mcg Oral Daily   Infusions:  . sodium chloride 250 mL (09/12/2019 2003)  . heparin 1,000 Units/hr (09/02/19 0615)    Labs: Recent Labs    09/02/2019 1026 09/02/19 0447  NA 138 138  K 4.1 5.2*  CL 101 100  CO2 19* 22  GLUCOSE 189* 131*  BUN 26* 43*  CREATININE 1.50* 2.30*  CALCIUM 9.6 9.2   No results for input(s): AST, ALT, ALKPHOS, BILITOT, PROT, ALBUMIN in the last 72 hours. Recent Labs    09/05/2019 0631 09/02/19 0447  WBC 21.6* 15.8*  NEUTROABS 18.2*  --   HGB 13.1 11.6*  HCT 38.5 34.4*  MCV 90.6 91.7  PLT 245 211   No results for input(s): CKTOTAL, CKMB, TROPONINI in the last 72 hours. Invalid input(s): POCBNP No results for input(s): HGBA1C in the last 72 hours.   Weights: Filed Weights   09/04/2019 0620 08/31/2019 1939  Weight: 60  kg 55.7 kg     Radiology/Studies:  DG Chest 2 View  Result Date: 08/13/2019 CLINICAL DATA:  Shortness of breath EXAM: CHEST - 2 VIEW COMPARISON:  08/02/2019 FINDINGS: The heart size and mediastinal contours are stable. Atherosclerotic calcification of the aortic knob. Moderate left pleural effusion, increased from prior. Hyper expanded lungs with chronically coarsened  interstitial markings. No pneumothorax. Exaggerated thoracic kyphosis. IMPRESSION: 1. Moderate left pleural effusion, increased from prior. 2. COPD. Electronically Signed   By: Davina Poke D.O.   On: 08/13/2019 13:16   US RENAL  Result Date: 08/05/2019 CLINICAL DATA:  Acute kidney injury. EXAM: RENAL / URINARY TRACT ULTRASOUND COMPLETE COMPARISON:  04/22/2019 FINDINGS: Right Kidney: Renal measurements: 9.6 x 3.6 x 4.1 cm = volume: 74.4 mL. No hydronephrosis. Echogenicity normal. There is a upper pole renal sinus cyst measuring 2 x 1.9 x 1.7 cm. Left Kidney: Renal measurements: 9.3 x 3.9 x 4.1 cm = volume: 77.0 mL. Large exophytic cyst arising from the lateral cortex of the left mid kidney measures 6.6 x 5.5 x 6.0 cm. No hydronephrosis. Normal echogenicity. Bladder: Appears normal for degree of bladder distention. Other: None. IMPRESSION: 1. No acute findings.  No hydronephrosis. 2. Bilateral kidney cysts. Electronically Signed   By: Kerby Moors M.D.   On: 08/05/2019 11:09   DG Chest Port 1 View  Result Date: 08/28/2019 CLINICAL DATA:  Shortness of breath and tachycardia. EXAM: PORTABLE CHEST 1 VIEW COMPARISON:  08/28/2019 FINDINGS: External pacer paddles are noted. The cardiac silhouette, mediastinal and hilar contours are within normal limits and stable. Stable tortuosity and calcification of the thoracic aorta. Moderate central vascular congestion and interstitial pulmonary edema along with a persistent left pleural effusion and left lower lobe atelectasis. No pneumothorax. The bony thorax is intact. IMPRESSION: CHF with left pleural effusion and left lower lobe atelectasis. Electronically Signed   By: Marijo Sanes M.D.   On: 08/30/2019 07:19   DG Chest Portable 1 View  Result Date: 08/28/2019 CLINICAL DATA:  Shortness of breath. EXAM: PORTABLE CHEST 1 VIEW COMPARISON:  August 16, 2019. FINDINGS: Stable cardiomegaly. No pneumothorax is noted. Right lung is clear. Mild left basilar atelectasis or  infiltrate is noted with associated effusion. Bony thorax is unremarkable. IMPRESSION: Mild left basilar atelectasis or infiltrate is noted with associated pleural effusion. Electronically Signed   By: Marijo Conception M.D.   On: 08/28/2019 13:24   DG Chest Port 1 View  Result Date: 08/16/2019 CLINICAL DATA:  Dyspnea EXAM: PORTABLE CHEST 1 VIEW COMPARISON:  08/02/2019 FINDINGS: Increased small to moderate left pleural effusion and left basilar atelectasis. Similar small right pleural effusion. Persistent interstitial prominence likely reflecting mild edema superimposed chronic changes. Similar cardiomediastinal contours. IMPRESSION: Increased small to moderate left pleural effusion with associated left basilar atelectasis. Similar small right pleural effusion. Persistent probable mild pulmonary edema superimposed on chronic interstitial prominence. Electronically Signed   By: Macy Mis M.D.   On: 08/16/2019 16:28   DG Chest Port 1 View  Result Date: 08/14/2019 CLINICAL DATA:  85 year old female status post thoracentesis EXAM: PORTABLE CHEST 1 VIEW COMPARISON:  08/13/2019 FINDINGS: Cardiomediastinal silhouette unchanged in size and contour. Evidence of prior PTCA. Mild interlobular septal thickening, seems increased from the prior. Coarsened interstitial markings persist from the prior. Decreased opacity at the left lung base with persistent blunting at the left costophrenic angle. No pneumothorax. Minimal blunting at the right costophrenic angle. IMPRESSION: Improved left-sided pleural effusion status post thoracentesis. No pneumothorax. New trace right-sided pleural  fluid. Early pulmonary edema. Electronically Signed   By: Corrie Mckusick D.O.   On: 08/14/2019 15:34   US RENAL ARTERY DUPLEX COMPLETE  Result Date: 08/19/2019 CLINICAL DATA:  85 year old female with a history of hypertension EXAM: RENAL/URINARY TRACT ULTRASOUND RENAL DUPLEX DOPPLER ULTRASOUND COMPARISON:  None. FINDINGS: Right Kidney:  Length: 9.1 cm. Echogenicity within normal limits. No hydronephrosis. Focal hypoechoic region of the lateral cortex on the right kidney measures estimated 3.1 cm x 1.8 cm. Recent CT dated 04/22/2019 demonstrates no correlate, and this is a most likely artifactual Anechoic cyst on the lateral cortex, inferior pole right kidney measures less than 2 cm, compatible with Bosniak 1 cyst. Left Kidney: Length: 9.7 cm. Echogenicity similar to the contralateral kidney. No hydronephrosis. Anechoic cyst on the left kidney cortex compatible with Bosniak 1 cyst. 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 Bilateral Bosniak 1 cysts. Signed, Dulcy Fanny. Dellia Nims, RPVI [facility] Radiology Electronically Signed   By: Corrie Mckusick D.O.   On: 08/19/2019 12:13   US THORACENTESIS ASP PLEURAL SPACE W/IMG GUIDE  Result Date: 08/14/2019 INDICATION: 85 year old female with a history of left-sided pleural effusion EXAM: ULTRASOUND GUIDED LEFT THORACENTESIS MEDICATIONS: None. COMPLICATIONS: None PROCEDURE: An ultrasound guided thoracentesis was thoroughly discussed with the patient and questions answered. The benefits, risks, alternatives and complications were also discussed. The patient understands and wishes to proceed with the procedure. Written consent was obtained. Ultrasound was performed to localize and mark an adequate pocket of fluid in the  left chest. The area was then prepped and draped in the normal sterile fashion. 1% Lidocaine was used for local anesthesia. Under ultrasound guidance a 8 Fr Safe-T-Centesis catheter was introduced. Thoracentesis was performed. The catheter was removed and a dressing applied. FINDINGS: A total of approximately 300 cc of thin yellow fluid was removed. The blood-tinged fluid is present secondary to contamination from the initial drain placement. Samples were sent to the laboratory as requested by the clinical team. IMPRESSION: Status post ultrasound-guided left-sided thoracentesis Signed, Dulcy Fanny. Dellia Nims, RPVI [facility] Radiology Electronically Signed   By: Corrie Mckusick D.O.   On: 08/14/2019 15:31     Assessment and Recommendation  85 y.o. female 85 year old female with acute on chronic diastolic dysfunction congestive heart failure multifactorial in nature including atrial fibrillation with rapid ventricular rate chronic kidney disease hypertension hyperlipidemia and hypoxia now slightly improved 1.  Continue diltiazem for heart rate control of atrial fibrillation with a goal heart rate between 60 and 90 bpm 2.  Anticoagulation with heparin and/or Eliquis for further risk reduction in stroke with atrial fibrillation 3.  Continue furosemide injection for pulmonary edema ~likely tomorrow to switch to oral medication management watching closely for chronic kidney disease 4.  No further cardiac diagnostics necessary at this time  Signed, Serafina Royals M.D. FACC

## 2019-09-02 NOTE — Progress Notes (Signed)
PROGRESS NOTE    Tina [name]  ZOX:[id number] DOB: 1935-01-16 DOA: 08/21/2019 PCP: Einar Pheasant, MD    Chief Complaint  Patient presents with  . Tachycardia    Brief Narrative: 85 year old female with history of COPD (on chronic 2 L home O2), diastolic CHF, CAD, CKD stage IIIb, hypertension with recent hospitalization for acute on chronic kidney disease (improved with hydration).  She was planned to be followed by outpatient palliative care and possibly transition to hospice if appropriate. This is her fourth admission in the last 6 months. Patient returned 2 days later to the ED (brought by EMS) with increasing shortness of breath since the morning of discharge.  EMS found her heart rate to be in 200s and were unable to obtain a blood pressure.  They attempted cardioversion without any improvement.  In the ED she was placed on BiPAP due to reduced work of breathing and also started on IV Cardizem drip. Patient admitted for A. fib with RVR and acute on chronic diastolic CHF.  Cardiology consulted.     Assessment & Plan:   Principal Problem:   Atrial fibrillation with RVR (HCC) Heart rate improved on brief Cardizem drip, transition to p.o. Cardizem.  On IV heparin drip. CHADS2VASC OF 6.  Not on anticoagulation previously.  Transition to oral Eliquis (renally dosed) Patient on aspirin and Plavix already for CAD.  With addition of Eliquis she has high risk for bleeding.  We will defer to cardiology  Active Problems: Acute on chronic respiratory failure with hypoxia (HCC) Likely combination of acute on chronic diastolic CHF and superimposed COPD. Required BiPAP upon admission and throughout the night.  Now maintaining sats on 5 L via nasal cannula. Continue IV Lasix 40 mg twice daily.  Patient noted to have poor urine output and renal function is worsening. Discontinue Cozaar.  COPD with acute exacerbation Continue DuoNeb.  Will add daily IV Solu-Medrol.  Wean oxygen as  tolerated   Acute on chronic kidney disease stage IV Worsening renal function from baseline.  Suspect cardiorenal.  Was recently hospitalized for AKI suspected to be due to dehydration and improved with fluids.   Failure to thrive Multiple hospitalization in the past several months.  During her hospital stay earlier this month palliative care was involved for goals of care discussion.  She finally agreed on outpatient palliative care and possible transition to hospice if no improvement.  I will involve palliative care again for goals of care discussion given her recurrent hospitalization.   DVT prophylaxis: Eliquis Code Status: DNR Family Communication: None advised Disposition: Home once respiratory function and AKI improved.  Further palliative care discussion for goals of care  Status is: Inpatient  Remains inpatient appropriate because:Hemodynamically unstable.  Acute on chronic hypoxic respiratory failure with acute on chronic kidney disease.   Dispo: The patient is from: Home              Anticipated d/c is to: Home              Anticipated d/c date is: 3 days              Patient currently is not medically stable to d/c.        Consultants:   Cardiology   Procedures: 2D echo      Subjective: Required BiPAP all night.  Reports breathing slightly better but still on 5 L via nasal cannula.  Noted to have poor urine output overnight.  Objective: Vitals:   09/02/19 1300 09/02/19  1400 09/02/19 1500 09/02/19 1600  BP: 114/75 (!) 111/56 108/61 105/70  Pulse: 62 (!) 57 (!) 55 (!) 58  Resp:      Temp:      TempSrc:      SpO2:      Weight:      Height:        Intake/Output Summary (Last 24 hours) at 09/02/2019 1726 Last data filed at 09/02/2019 1708 Gross per 24 hour  Intake 1172.69 ml  Output 150 ml  Net 1022.69 ml   Filed Weights   09/11/2019 0620 08/24/2019 1939  Weight: 60 kg 55.7 kg    Examination:  General: Elderly female appears fatigued in some  distress HEENT: Moist mucosa, supple neck Chest: Scattered rhonchi with diminished left basilar breath sound CVs: S1-S2 regular, no murmurs GI: Soft, nondistended, nontender Musculoskeletal: Warm, trace edema    Data Reviewed: I have personally reviewed following labs and imaging studies  CBC: Recent Labs  Lab 08/28/19 1304 08/29/19 0442 08/14/2019 0631 09/02/19 0447  WBC 12.2* 7.8 21.6* 15.8*  NEUTROABS  --   --  18.2*  --   HGB 12.7 11.9* 13.1 11.6*  HCT 38.8 35.6* 38.5 34.4*  MCV 94.2 92.2 90.6 91.7  PLT 199 184 245 496    Basic Metabolic Panel: Recent Labs  Lab 08/28/19 1304 08/29/19 0442 08/20/2019 1026 09/02/19 0447  NA 136 140 138 138  K 4.7 4.0 4.1 5.2*  CL 98 102 101 100  CO2 27 28 19* 22  GLUCOSE 139* 107* 189* 131*  BUN 42* 34* 26* 43*  CREATININE 1.81* 1.20* 1.50* 2.30*  CALCIUM 9.2 9.2 9.6 9.2    GFR: Estimated Creatinine Clearance: 16.6 mL/min (A) (by C-G formula based on SCr of 2.3 mg/dL (H)).  Liver Function Tests: Recent Labs  Lab 08/28/19 1304  AST 13*  ALT 15  ALKPHOS 56  BILITOT 1.3*  PROT 6.1*  ALBUMIN 3.5    CBG: Recent Labs  Lab 08/29/19 0757  GLUCAP 119*     Recent Results (from the past 240 hour(s))  SARS CORONAVIRUS 2 (TAT 6-24 HRS) Nasopharyngeal Nasopharyngeal Swab     Status: None   Collection Time: 08/28/19  4:13 PM   Specimen: Nasopharyngeal Swab  Result Value Ref Range Status   SARS Coronavirus 2 NEGATIVE NEGATIVE Final    Comment: (NOTE) SARS-CoV-2 target nucleic acids are NOT DETECTED. The SARS-CoV-2 RNA is generally detectable in upper and lower respiratory specimens during the acute phase of infection. Negative results do not preclude SARS-CoV-2 infection, do not rule out co-infections with other pathogens, and should not be used as the sole basis for treatment or other patient management decisions. Negative results must be combined with clinical observations, patient history, and epidemiological  information. The expected result is Negative. Fact Sheet for Patients: SugarRoll.be Fact Sheet for Healthcare Providers: https://www.woods-mathews.com/ This test is not yet approved or cleared by the [place] FDA and  has been authorized for detection and/or diagnosis of SARS-CoV-2 by FDA under an Emergency Use Authorization (EUA). This EUA will remain  in effect (meaning this test can be used) for the duration of the COVID-19 declaration under Section 56 4(b)(1) of the Act, 21 U.S.C. section 360bbb-3(b)(1), unless the authorization is terminated or revoked sooner. Performed at [facility], [place] [address], [place], [place] [address]   Respiratory Panel by RT PCR (Flu A&B, Covid) - Nasopharyngeal Swab     Status: None   Collection Time: 08/17/2019 11:29 AM  Specimen: Nasopharyngeal Swab  Result Value Ref Range Status   SARS Coronavirus 2 by RT PCR NEGATIVE NEGATIVE Final    Comment: (NOTE) SARS-CoV-2 target nucleic acids are NOT DETECTED. The SARS-CoV-2 RNA is generally detectable in upper respiratoy specimens during the acute phase of infection. The lowest concentration of SARS-CoV-2 viral copies this assay can detect is 131 copies/mL. A negative result does not preclude SARS-Cov-2 infection and should not be used as the sole basis for treatment or other patient management decisions. A negative result may occur with  improper specimen collection/handling, submission of specimen other than nasopharyngeal swab, presence of viral mutation(s) within the areas targeted by this assay, and inadequate number of viral copies (<131 copies/mL). A negative result must be combined with clinical observations, patient history, and epidemiological information. The expected result is Negative. Fact Sheet for Patients:  PinkCheek.be Fact Sheet for Healthcare Providers:  GravelBags.it This  test is not yet ap proved or cleared by the [place] FDA and  has been authorized for detection and/or diagnosis of SARS-CoV-2 by FDA under an Emergency Use Authorization (EUA). This EUA will remain  in effect (meaning this test can be used) for the duration of the COVID-19 declaration under Section 564(b)(1) of the Act, 21 U.S.C. section 360bbb-3(b)(1), unless the authorization is terminated or revoked sooner.    Influenza A by PCR NEGATIVE NEGATIVE Final   Influenza B by PCR NEGATIVE NEGATIVE Final    Comment: (NOTE) The Xpert Xpress SARS-CoV-2/FLU/RSV assay is intended as an aid in  the diagnosis of influenza from Nasopharyngeal swab specimens and  should not be used as a sole basis for treatment. Nasal washings and  aspirates are unacceptable for Xpert Xpress SARS-CoV-2/FLU/RSV  testing. Fact Sheet for Patients: PinkCheek.be Fact Sheet for Healthcare Providers: GravelBags.it This test is not yet approved or cleared by the [place] FDA and  has been authorized for detection and/or diagnosis of SARS-CoV-2 by  FDA under an Emergency Use Authorization (EUA). This EUA will remain  in effect (meaning this test can be used) for the duration of the  Covid-19 declaration under Section 564(b)(1) of the Act, 21  U.S.C. section 360bbb-3(b)(1), unless the authorization is  terminated or revoked. Performed at [facility], [address], [place], [place] [address]          Radiology Studies: [facility] Chest [place] 1 View  Result Date: 09/12/2019 CLINICAL DATA:  Shortness of breath and tachycardia. EXAM: PORTABLE CHEST 1 VIEW COMPARISON:  08/28/2019 FINDINGS: External pacer paddles are noted. The cardiac silhouette, mediastinal and hilar contours are within normal limits and stable. Stable tortuosity and calcification of the thoracic aorta. Moderate central vascular congestion and interstitial pulmonary edema along with  a persistent left pleural effusion and left lower lobe atelectasis. No pneumothorax. The bony thorax is intact. IMPRESSION: CHF with left pleural effusion and left lower lobe atelectasis. Electronically Signed   By: Marijo Sanes M.D.   On: 09/07/2019 07:19        Scheduled Meds: . apixaban  2.5 mg Oral BID  . aspirin EC  81 mg Oral Daily  . cholecalciferol  1,000 Units Oral Daily  . clopidogrel  75 mg Oral Daily  . diltiazem  240 mg Oral Daily  . furosemide  40 mg Intravenous Q12H  . gabapentin  200 mg Oral QHS  . hydrALAZINE  50 mg Oral TID  . labetalol  200 mg Oral BID  . mometasone-formoterol  2 puff Inhalation BID  . pravastatin  40 mg Oral q1800  . roflumilast  250 mcg Oral Daily  . cyanocobalamin  1,000 mcg Oral Daily   Continuous Infusions: . sodium chloride 250 mL (09/07/2019 2003)  . heparin 1,150 Units/hr (09/02/19 1639)     LOS: 1 day    Time spent: 9 minutes    Sherrill Buikema, MD [facility]   To contact the attending provider between 7A-7P or the covering provider during after hours 7P-7A, please log into the web site [email or web address] and access using universal [place] password for that web site. If you do not have the password, please call the hospital operator.  09/02/2019, 5:26 PM

## 2019-09-02 NOTE — Progress Notes (Signed)
At 2350 patient begin complaining of shortness of breath. Notified respiratory therapist of current condition. Notified Ouma NP of low blood pressure and anxiety. Cardizem drip has been stopped. Heart rate is in the 60's but afib. Duoneb was administered.

## 2019-09-02 NOTE — Progress Notes (Addendum)
[place] for Heparin drip  Indication: atrial fibrillation  Allergies  Allergen Reactions  . Evista [Raloxifene] Other (See Comments)    Night sweats   . Fluticasone-Salmeterol Other (See Comments)    Cough, "chokes me"     Patient Measurements: Height: '5\' 5"'  (165.1 cm) Weight: (bed rest. also bed not zeroed. nurse notified.) IBW/kg (Calculated) : 57 Heparin Dosing Weight: 55.7 kg  Vital Signs: Temp: 98.2 F (36.8 C) (04/20 1123) Temp Source: Oral (04/20 1123) BP: 111/56 (04/20 1400) Pulse Rate: 57 (04/20 1400)  Labs: Recent Labs    08/21/2019 0631 08/19/2019 1026 08/24/2019 2039 09/02/19 0447 09/02/19 1353  HGB 13.1  --   --  11.6*  --   HCT 38.5  --   --  34.4*  --   PLT 245  --   --  211  --   APTT  --   --  30  --   --   LABPROT  --   --  13.7  --   --   INR  --   --  1.1  --   --   HEPARINUNFRC  --   --   --  0.10* <0.10*  CREATININE  --  1.50*  --  2.30*  --   TROPONINIHS 40* 39* 37*  --   --     Estimated Creatinine Clearance: 16.6 mL/min (A) (by C-G formula based on SCr of 2.3 mg/dL (H)).   Medical History: Past Medical History:  Diagnosis Date  . Anemia   . Asthma   . Bladder cancer ([place])   . BRCA positive   . CAD (coronary artery disease)   . CHF (congestive heart failure) ([place])   . COPD (chronic obstructive pulmonary disease) ([place])   . Emphysema lung ([place])   . GERD (gastroesophageal reflux disease)   . History of colon polyps   . Hypercholesterolemia   . Hyperglycemia   . Hypertension   . Lung nodules   . Osteoporosis   . Personal history of tobacco use, presenting hazards to health 11/25/2014  . Polycythemia vera(238.4)   . Renal cyst   . Skin cancer     Medications:  Scheduled:  . aspirin EC  81 mg Oral Daily  . cholecalciferol  1,000 Units Oral Daily  . clopidogrel  75 mg Oral Daily  . diltiazem  240 mg Oral Daily  . furosemide  40 mg Intravenous Q12H  . gabapentin  200 mg Oral QHS  .  hydrALAZINE  50 mg Oral TID  . labetalol  200 mg Oral BID  . mometasone-formoterol  2 puff Inhalation BID  . pravastatin  40 mg Oral q1800  . roflumilast  250 mcg Oral Daily  . cyanocobalamin  1,000 mcg Oral Daily   Infusions:  . sodium chloride 250 mL (08/25/2019 2003)  . heparin 1,000 Units/hr (09/02/19 0615)    Assessment: 85 yo F to start Heparin drip for Afib. On ASA, plavix PTA Hgb 13.1  Plt 245  INR pending  APTT pending  04/20 @ 1434 HL < 0.10 subtherapeutic. Confirmed no heparin interruptions.   Goal of Therapy:  Heparin level 0.3-0.7 units/ml Monitor platelets by anticoagulation protocol: Yes   Plan:   Will rebolus heparin 1650 units IV x 1 and increase rate to 1150 units/hr and will recheck HL in 8 hours(d/t renal function) and will continue to monitor. Update @ 1543: Per d/w Dr. Clementeen Graham, patient will transition to Eliquis. Will order  Eliquis 2.5 mg BID to start at 2200 tonight. Will STOP heparin infusion at 2200 tonight.   Kristeen Miss, PharmD Clinical Pharmacist 09/02/2019,3:03 PM

## 2019-09-02 NOTE — Plan of Care (Signed)
  Problem: Education: Goal: Knowledge of General Education information will improve Description: Including pain rating scale, medication(s)/side effects and non-pharmacologic comfort measures Outcome: Progressing   Problem: Health Behavior/Discharge Planning: Goal: Ability to manage health-related needs will improve Outcome: Not Progressing   Problem: Clinical Measurements: Goal: Ability to maintain clinical measurements within normal limits will improve Outcome: Not Progressing   Problem: Activity: Goal: Capacity to carry out activities will improve Outcome: Not Progressing

## 2019-09-02 NOTE — TOC Progression Note (Signed)
Transition of Care [facility]) - Progression Note    Patient Details  Name: Tina [name] MRN: [id number] Date of Birth: Jul 05, 1934  Transition of Care [facility]) CM/SW [place], RN Phone Number: 09/02/2019, 12:36 PM  Clinical Narrative:      Physician asked to add [facility] RN, spoke with Corene Cornea with [place].   Expected Discharge Plan: [place] Barriers to Discharge: Continued Medical Work up  Expected Discharge Plan and Services Expected Discharge Plan: [place]   Discharge Planning Services: CM Consult Post Acute Care Choice: Durable Medical Equipment, Home Health Living arrangements for the past 2 months: Single Family Home(has had recent SNF admit, but refused readmit and dc home with [place] 4 days ago)                           HH Arranged: RN [place] Agency: [place] ([facility]) Date [place]: 09/02/19 Time [place]: 1228 Representative spoke with at [place]: [place] ([place]) Interventions    Readmission Risk Interventions Readmission Risk Prevention Plan 08/15/2019 08/14/2019  Transportation Screening - Complete  PCP or Specialist Appt within 3-5 Days - Complete  HRI or Home Care Consult Complete (No Data)  Social Work Consult for [place] Planning/Counseling - [place] - Not Applicable  Medication Review Press photographer) - Complete  Some recent data might be hidden

## 2019-09-02 NOTE — Progress Notes (Signed)
No urine output overnight.  Bladder scan showed 24ml in bladder.  Creatinine bumped from 1.50 to 2.30 overnight.  MD sent message via secure chat.  Orders to hold AM lasix.

## 2019-09-03 DIAGNOSIS — J9621 Acute and chronic respiratory failure with hypoxia: Secondary | ICD-10-CM | POA: Diagnosis not present

## 2019-09-03 DIAGNOSIS — Z515 Encounter for palliative care: Secondary | ICD-10-CM | POA: Diagnosis not present

## 2019-09-03 DIAGNOSIS — Z7189 Other specified counseling: Secondary | ICD-10-CM

## 2019-09-03 DIAGNOSIS — I5033 Acute on chronic diastolic (congestive) heart failure: Secondary | ICD-10-CM | POA: Diagnosis not present

## 2019-09-03 DIAGNOSIS — I4891 Unspecified atrial fibrillation: Secondary | ICD-10-CM | POA: Diagnosis not present

## 2019-09-03 LAB — BASIC METABOLIC PANEL
Anion gap: 10 (ref 5–15)
BUN: 63 mg/dL — ABNORMAL HIGH (ref 8–23)
CO2: 24 mmol/L (ref 22–32)
Calcium: 8.6 mg/dL — ABNORMAL LOW (ref 8.9–10.3)
Chloride: 99 mmol/L (ref 98–111)
Creatinine, Ser: 2.44 mg/dL — ABNORMAL HIGH (ref 0.44–1.00)
GFR calc Af Amer: 21 mL/min — ABNORMAL LOW (ref >60)
GFR calc non Af Amer: 18 mL/min — ABNORMAL LOW (ref >60)
Glucose, Bld: 227 mg/dL — ABNORMAL HIGH (ref 70–99)
Potassium: 4.5 mmol/L (ref 3.5–5.1)
Sodium: 133 mmol/L — ABNORMAL LOW (ref 135–145)

## 2019-09-03 LAB — HEMOGLOBIN A1C
Hgb A1c MFr Bld: 6 % — ABNORMAL HIGH (ref 4.8–5.6)
Mean Plasma Glucose: 125.5 mg/dL

## 2019-09-03 LAB — ECHOCARDIOGRAM COMPLETE
Height: 65 in
Weight: 1963.2 oz

## 2019-09-03 LAB — GLUCOSE, CAPILLARY: Glucose-Capillary: 237 mg/dL — ABNORMAL HIGH (ref 70–99)

## 2019-09-03 MED ORDER — FUROSEMIDE 10 MG/ML IJ SOLN
4.0000 mg/h | INTRAVENOUS | Status: DC
  Administered 2019-09-03 – 2019-09-04 (×2): 4 mg/h via INTRAVENOUS
  Filled 2019-09-03: qty 20
  Filled 2019-09-03: qty 25

## 2019-09-03 MED ORDER — INSULIN ASPART 100 UNIT/ML ~~LOC~~ SOLN
0.0000 [IU] | Freq: Three times a day (TID) | SUBCUTANEOUS | Status: DC
  Filled 2019-09-03: qty 1

## 2019-09-03 NOTE — Progress Notes (Signed)
PROGRESS NOTE    Tina [name]  GUY:[id number] DOB: 1935-01-04 DOA: 08/27/2019 PCP: Einar Pheasant, MD (Confirm with patient/family/NH records and if not entered, this HAS to be entered at [facility] point of entry. "No PCP" if truly none.)   Brief Narrative: (Start on day 1 of progress note - keep it brief and live) Patient is a 85 year old female with history of COPD with chronic hypoxemic restaurant failure, on 2 L oxygen, chronic diastolic congestive heart failure, chronic kidney disease stage IIIb, coronary disease, hypertension, who was admitted to the hospital with atrial fibrillation and a rapid ventricular response, acute on chronic hypoxemic restaurant failure.  She has been giving IV Lasix, she was also started on IV steroids for COPD exacerbation.  Chest x-ray showed changes consistent with CHF exacerbation. Her condition does not seem to be improving.  She has been on BiPAP on and off since 4/20.   Assessment & Plan:   Principal Problem:   Atrial fibrillation with RVR (HCC) Active Problems:   COPD, severe (HCC)   Essential hypertension   Leukocytosis   CHF (congestive heart failure) (HCC)   Rapid atrial fibrillation (HCC)   CKD (chronic kidney disease), stage IV ([place])  #1.  Acute on chronic hypoxemic respiratory failure. Patient condition has not been improving.  She is put back on BiPAP today.  She has been taking IV Lasix, her renal function is getting worse.  I will change to Lasix drip.  #2.  Atrial fibrillation with rapid ventricular response. Continue anticoagulation.  #3.  Acute on chronic diastolic congestive heart failure. As above.  #4.  Acute kidney injury. Worsening renal function on diuretics.  Patient still has evidence of volume overload and worsening hypoxemia.  I will change Lasix to IV drip.  Consult nephrology.  Follow renal function closely.  #4.  COPD exacerbation. Continue IV steroids. Heart rate better controlled now.  Patient placed on Eliquis for  stroke prevention.   DVT prophylaxis: Eliquis Code Status: DNR Family Communication: Gust with patient and the patient daughter, all questions answered. Disposition Plan:  . Patient came from: Home            . Anticipated d/c place: ECF . Barriers to d/c OR conditions which need to be met to effect a safe d/c:   Consultants:   Nephrology  Procedures: None Antimicrobials: None  Subjective: Patient has a worsening hypoxemia today, is placed on BiPAP.  Still has signal short of breath with minimal exertion. He does not have a cough. No fever or chills. No nausea vomiting or diarrhea.  Do not feel constipated.  Objective: Vitals:   09/03/19 0512 09/03/19 0825 09/03/19 0832 09/03/19 1208  BP: 106/61 136/67 136/67 (!) 100/50  Pulse: 63 72 72 (!) 56  Resp: 20 18  (!) 22  Temp: 97.7 F (36.5 C)     TempSrc: Oral     SpO2: 99% 97%  98%  Weight: 60.1 kg     Height:        Intake/Output Summary (Last 24 hours) at 09/03/2019 1222 Last data filed at 09/03/2019 1007 Gross per 24 hour  Intake 546.14 ml  Output 200 ml  Net 346.14 ml   Filed Weights   09/03/2019 0620 08/19/2019 1939 09/03/19 0512  Weight: 60 kg 55.7 kg 60.1 kg    Examination:  General exam: Appears calm and comfortable  Respiratory system: Decreased breathing sounds with a few crackles in the base. Cardiovascular system: S1 & S2 heard, irregular. No JVD, murmurs,  rubs, gallops or clicks. 2+ pedal edema with chronic lymphedema. Gastrointestinal system: Abdomen is nondistended, soft and nontender. No organomegaly or masses felt. Normal bowel sounds heard. Central nervous system: Alert and oriented. No focal neurological deficits. Extremities: Symmetric 5 x 5 power. Skin: No rashes, lesions or ulcers Psychiatry: Judgement and insight appear normal. Mood & affect appropriate.     Data Reviewed: I have personally reviewed following labs and imaging studies  CBC: Recent Labs  Lab 08/28/19 1304 08/29/19 0442  08/20/2019 0631 09/02/19 0447  WBC 12.2* 7.8 21.6* 15.8*  NEUTROABS  --   --  18.2*  --   HGB 12.7 11.9* 13.1 11.6*  HCT 38.8 35.6* 38.5 34.4*  MCV 94.2 92.2 90.6 91.7  PLT 199 184 245 295   Basic Metabolic Panel: Recent Labs  Lab 08/28/19 1304 08/29/19 0442 08/20/2019 1026 09/02/19 0447 09/03/19 0602  NA 136 140 138 138 133*  K 4.7 4.0 4.1 5.2* 4.5  CL 98 102 101 100 99  CO2 27 28 19* 22 24  GLUCOSE 139* 107* 189* 131* 227*  BUN 42* 34* 26* 43* 63*  CREATININE 1.81* 1.20* 1.50* 2.30* 2.44*  CALCIUM 9.2 9.2 9.6 9.2 8.6*   GFR: Estimated Creatinine Clearance: 16 mL/min (A) (by C-G formula based on SCr of 2.44 mg/dL (H)). Liver Function Tests: Recent Labs  Lab 08/28/19 1304  AST 13*  ALT 15  ALKPHOS 56  BILITOT 1.3*  PROT 6.1*  ALBUMIN 3.5   No results for input(s): LIPASE, AMYLASE in the last 168 hours. No results for input(s): AMMONIA in the last 168 hours. Coagulation Profile: Recent Labs  Lab 09/10/2019 2039  INR 1.1   Cardiac Enzymes: No results for input(s): CKTOTAL, CKMB, CKMBINDEX, TROPONINI in the last 168 hours. BNP (last 3 results) No results for input(s): PROBNP in the last 8760 hours. HbA1C: No results for input(s): HGBA1C in the last 72 hours. CBG: Recent Labs  Lab 08/29/19 0757  GLUCAP 119*   Lipid Profile: No results for input(s): CHOL, HDL, LDLCALC, TRIG, CHOLHDL, LDLDIRECT in the last 72 hours. Thyroid Function Tests: No results for input(s): TSH, T4TOTAL, FREET4, T3FREE, THYROIDAB in the last 72 hours. Anemia Panel: No results for input(s): VITAMINB12, FOLATE, FERRITIN, TIBC, IRON, RETICCTPCT in the last 72 hours. Sepsis Labs: No results for input(s): PROCALCITON, LATICACIDVEN in the last 168 hours.  Recent Results (from the past 240 hour(s))  SARS CORONAVIRUS 2 (TAT 6-24 HRS) Nasopharyngeal Nasopharyngeal Swab     Status: None   Collection Time: 08/28/19  4:13 PM   Specimen: Nasopharyngeal Swab  Result Value Ref Range Status   SARS  Coronavirus 2 NEGATIVE NEGATIVE Final    Comment: (NOTE) SARS-CoV-2 target nucleic acids are NOT DETECTED. The SARS-CoV-2 RNA is generally detectable in upper and lower respiratory specimens during the acute phase of infection. Negative results do not preclude SARS-CoV-2 infection, do not rule out co-infections with other pathogens, and should not be used as the sole basis for treatment or other patient management decisions. Negative results must be combined with clinical observations, patient history, and epidemiological information. The expected result is Negative. Fact Sheet for Patients: SugarRoll.be Fact Sheet for Healthcare Providers: https://www.woods-mathews.com/ This test is not yet approved or cleared by the [place] FDA and  has been authorized for detection and/or diagnosis of SARS-CoV-2 by FDA under an Emergency Use Authorization (EUA). This EUA will remain  in effect (meaning this test can be used) for the duration of the COVID-19 declaration under Section  56 4(b)(1) of the Act, 21 U.S.C. section 360bbb-3(b)(1), unless the authorization is terminated or revoked sooner. Performed at [facility], [place] [address], [place], [place] [address]   Respiratory Panel by RT PCR (Flu A&B, Covid) - Nasopharyngeal Swab     Status: None   Collection Time: 08/19/2019 11:29 AM   Specimen: Nasopharyngeal Swab  Result Value Ref Range Status   SARS Coronavirus 2 by RT PCR NEGATIVE NEGATIVE Final    Comment: (NOTE) SARS-CoV-2 target nucleic acids are NOT DETECTED. The SARS-CoV-2 RNA is generally detectable in upper respiratoy specimens during the acute phase of infection. The lowest concentration of SARS-CoV-2 viral copies this assay can detect is 131 copies/mL. A negative result does not preclude SARS-Cov-2 infection and should not be used as the sole basis for treatment or other patient management decisions. A negative result may occur  with  improper specimen collection/handling, submission of specimen other than nasopharyngeal swab, presence of viral mutation(s) within the areas targeted by this assay, and inadequate number of viral copies (<131 copies/mL). A negative result must be combined with clinical observations, patient history, and epidemiological information. The expected result is Negative. Fact Sheet for Patients:  PinkCheek.be Fact Sheet for Healthcare Providers:  GravelBags.it This test is not yet ap proved or cleared by the [place] FDA and  has been authorized for detection and/or diagnosis of SARS-CoV-2 by FDA under an Emergency Use Authorization (EUA). This EUA will remain  in effect (meaning this test can be used) for the duration of the COVID-19 declaration under Section 564(b)(1) of the Act, 21 U.S.C. section 360bbb-3(b)(1), unless the authorization is terminated or revoked sooner.    Influenza A by PCR NEGATIVE NEGATIVE Final   Influenza B by PCR NEGATIVE NEGATIVE Final    Comment: (NOTE) The Xpert Xpress SARS-CoV-2/FLU/RSV assay is intended as an aid in  the diagnosis of influenza from Nasopharyngeal swab specimens and  should not be used as a sole basis for treatment. Nasal washings and  aspirates are unacceptable for Xpert Xpress SARS-CoV-2/FLU/RSV  testing. Fact Sheet for Patients: PinkCheek.be Fact Sheet for Healthcare Providers: GravelBags.it This test is not yet approved or cleared by the [place] FDA and  has been authorized for detection and/or diagnosis of SARS-CoV-2 by  FDA under an Emergency Use Authorization (EUA). This EUA will remain  in effect (meaning this test can be used) for the duration of the  Covid-19 declaration under Section 564(b)(1) of the Act, 21  U.S.C. section 360bbb-3(b)(1), unless the authorization is  terminated or revoked. Performed  at [facility], [address] [place],  [address]          Radiology Studies: No results found.      Scheduled Meds: . apixaban  2.5 mg Oral BID  . aspirin EC  81 mg Oral Daily  . cholecalciferol  1,000 Units Oral Daily  . clopidogrel  75 mg Oral Daily  . diltiazem  240 mg Oral Daily  . gabapentin  200 mg Oral QHS  . hydrALAZINE  50 mg Oral TID  . insulin aspart  0-9 Units Subcutaneous TID WC  . labetalol  200 mg Oral BID  . methylPREDNISolone (SOLU-MEDROL) injection  40 mg Intravenous Q12H  . mometasone-formoterol  2 puff Inhalation BID  . pravastatin  40 mg Oral q1800  . roflumilast  250 mcg Oral Daily  . sodium chloride flush  10 mL Intravenous Q12H  . cyanocobalamin  1,000 mcg Oral Daily   Continuous  Infusions: . sodium chloride 250 mL (08/21/2019 2003)  . furosemide (LASIX) infusion       LOS: 2 days    Time spent: 45 minutes    Sharen Hones, MD [facility]   To contact the attending provider between 7A-7P or the covering provider during after hours 7P-7A, please log into the web site [email or web address] and access using universal [place] password for that web site. If you do not have the password, please call the hospital operator.  09/03/2019, 12:22 PM

## 2019-09-03 NOTE — Progress Notes (Signed)
[facility] Encounter Note  Patient: Tina [name] / Admit Date: 08/28/2019 / Date of Encounter: 09/03/2019, 1:45 PM   Subjective: Patient still somewhat short of breath weak and fatigued multifactorial in nature including pulmonary issues with resolving acute on chronic diastolic dysfunction heart failure.  Heart rate much better controlled at this time with oral diltiazem at 240 mg and labetalol.   Patient now placed on anticoagulation with Eliquis without evidence of bleeding complication Lasix drip is appearing to work relatively well but still has basilar crackles and shortness of breath with some more input than output today Echocardiogram with valvular heart disease atrial enlargement and normal LV function consistent with diastolic dysfunction heart failure Review of Systems: Positive for: Shortness of breath Negative for: Vision change, hearing change, syncope, dizziness, nausea, vomiting,diarrhea, bloody stool, stomach pain, cough, congestion, diaphoresis, urinary frequency, urinary pain,skin lesions, skin rashes Others previously listed  Objective: Telemetry: Atrial fibrillation with controlled ventricular rate Physical Exam: Blood pressure (!) 100/50, pulse (!) 56, temperature 97.7 F (36.5 C), temperature source Oral, resp. rate (!) 22, height 5\' 5"  (1.651 m), weight 60.1 kg, SpO2 98 %. Body mass index is 22.03 kg/m. General: Well developed, well nourished, in no acute distress. Head: Normocephalic, atraumatic, sclera non-icteric, no xanthomas, nares are without discharge. Neck: No apparent masses Lungs: Normal respirations with some wheezes, few rhonchi, no rales , no crackles   Heart: Irregular rate and rhythm, normal S1 S2, no murmur, no rub, no gallop, PMI is normal size and placement, carotid upstroke normal without bruit, jugular venous pressure normal Abdomen: Soft, non-tender, non-distended with normoactive bowel sounds. No hepatosplenomegaly.  Abdominal aorta is normal size without bruit Extremities: Trace edema, no clubbing, no cyanosis, no ulcers,  Peripheral: 2+ radial, 2+ femoral, 2+ dorsal pedal pulses Neuro: Alert and oriented. Moves all extremities spontaneously. Psych:  Responds to questions appropriately with a normal affect.   Intake/Output Summary (Last 24 hours) at 09/03/2019 1345 Last data filed at 09/03/2019 1007 Gross per 24 hour  Intake 306.14 ml  Output 200 ml  Net 106.14 ml    Inpatient Medications:  . apixaban  2.5 mg Oral BID  . aspirin EC  81 mg Oral Daily  . cholecalciferol  1,000 Units Oral Daily  . clopidogrel  75 mg Oral Daily  . diltiazem  240 mg Oral Daily  . gabapentin  200 mg Oral QHS  . hydrALAZINE  50 mg Oral TID  . insulin aspart  0-9 Units Subcutaneous TID WC  . labetalol  200 mg Oral BID  . methylPREDNISolone (SOLU-MEDROL) injection  40 mg Intravenous Q12H  . mometasone-formoterol  2 puff Inhalation BID  . pravastatin  40 mg Oral q1800  . roflumilast  250 mcg Oral Daily  . sodium chloride flush  10 mL Intravenous Q12H  . cyanocobalamin  1,000 mcg Oral Daily   Infusions:  . sodium chloride 250 mL (09/05/2019 2003)  . furosemide (LASIX) infusion      Labs: Recent Labs    09/02/19 0447 09/03/19 0602  NA 138 133*  K 5.2* 4.5  CL 100 99  CO2 22 24  GLUCOSE 131* 227*  BUN 43* 63*  CREATININE 2.30* 2.44*  CALCIUM 9.2 8.6*   No results for input(s): AST, ALT, ALKPHOS, BILITOT, PROT, ALBUMIN in the last 72 hours. Recent Labs    08/31/2019 0631 09/02/19 0447  WBC 21.6* 15.8*  NEUTROABS 18.2*  --   HGB 13.1 11.6*  HCT 38.5 34.4*  MCV 90.6  91.7  PLT 245 211   No results for input(s): CKTOTAL, CKMB, TROPONINI in the last 72 hours. Invalid input(s): POCBNP No results for input(s): HGBA1C in the last 72 hours.   Weights: Filed Weights   08/19/2019 0620 09/09/2019 1939 09/03/19 0512  Weight: 60 kg 55.7 kg 60.1 kg     Radiology/Studies:  DG Chest 2 View  Result Date:  08/13/2019 CLINICAL DATA:  Shortness of breath EXAM: CHEST - 2 VIEW COMPARISON:  08/02/2019 FINDINGS: The heart size and mediastinal contours are stable. Atherosclerotic calcification of the aortic knob. Moderate left pleural effusion, increased from prior. Hyper expanded lungs with chronically coarsened interstitial markings. No pneumothorax. Exaggerated thoracic kyphosis. IMPRESSION: 1. Moderate left pleural effusion, increased from prior. 2. COPD. Electronically Signed   By: Davina Poke D.O.   On: 08/13/2019 13:16   US RENAL  Result Date: 08/05/2019 CLINICAL DATA:  Acute kidney injury. EXAM: RENAL / URINARY TRACT ULTRASOUND COMPLETE COMPARISON:  04/22/2019 FINDINGS: Right Kidney: Renal measurements: 9.6 x 3.6 x 4.1 cm = volume: 74.4 mL. No hydronephrosis. Echogenicity normal. There is a upper pole renal sinus cyst measuring 2 x 1.9 x 1.7 cm. Left Kidney: Renal measurements: 9.3 x 3.9 x 4.1 cm = volume: 77.0 mL. Large exophytic cyst arising from the lateral cortex of the left mid kidney measures 6.6 x 5.5 x 6.0 cm. No hydronephrosis. Normal echogenicity. Bladder: Appears normal for degree of bladder distention. Other: None. IMPRESSION: 1. No acute findings.  No hydronephrosis. 2. Bilateral kidney cysts. Electronically Signed   By: Kerby Moors M.D.   On: 08/05/2019 11:09   DG Chest Port 1 View  Result Date: 08/16/2019 CLINICAL DATA:  Shortness of breath and tachycardia. EXAM: PORTABLE CHEST 1 VIEW COMPARISON:  08/28/2019 FINDINGS: External pacer paddles are noted. The cardiac silhouette, mediastinal and hilar contours are within normal limits and stable. Stable tortuosity and calcification of the thoracic aorta. Moderate central vascular congestion and interstitial pulmonary edema along with a persistent left pleural effusion and left lower lobe atelectasis. No pneumothorax. The bony thorax is intact. IMPRESSION: CHF with left pleural effusion and left lower lobe atelectasis. Electronically Signed    By: Marijo Sanes M.D.   On: 08/18/2019 07:19   DG Chest Portable 1 View  Result Date: 08/28/2019 CLINICAL DATA:  Shortness of breath. EXAM: PORTABLE CHEST 1 VIEW COMPARISON:  August 16, 2019. FINDINGS: Stable cardiomegaly. No pneumothorax is noted. Right lung is clear. Mild left basilar atelectasis or infiltrate is noted with associated effusion. Bony thorax is unremarkable. IMPRESSION: Mild left basilar atelectasis or infiltrate is noted with associated pleural effusion. Electronically Signed   By: Marijo Conception M.D.   On: 08/28/2019 13:24   DG Chest Port 1 View  Result Date: 08/16/2019 CLINICAL DATA:  Dyspnea EXAM: PORTABLE CHEST 1 VIEW COMPARISON:  08/02/2019 FINDINGS: Increased small to moderate left pleural effusion and left basilar atelectasis. Similar small right pleural effusion. Persistent interstitial prominence likely reflecting mild edema superimposed chronic changes. Similar cardiomediastinal contours. IMPRESSION: Increased small to moderate left pleural effusion with associated left basilar atelectasis. Similar small right pleural effusion. Persistent probable mild pulmonary edema superimposed on chronic interstitial prominence. Electronically Signed   By: Macy Mis M.D.   On: 08/16/2019 16:28   DG Chest Port 1 View  Result Date: 08/14/2019 CLINICAL DATA:  85 year old female status post thoracentesis EXAM: PORTABLE CHEST 1 VIEW COMPARISON:  08/13/2019 FINDINGS: Cardiomediastinal silhouette unchanged in size and contour. Evidence of prior PTCA. Mild interlobular septal thickening,  seems increased from the prior. Coarsened interstitial markings persist from the prior. Decreased opacity at the left lung base with persistent blunting at the left costophrenic angle. No pneumothorax. Minimal blunting at the right costophrenic angle. IMPRESSION: Improved left-sided pleural effusion status post thoracentesis. No pneumothorax. New trace right-sided pleural fluid. Early pulmonary edema.  Electronically Signed   By: Corrie Mckusick D.O.   On: 08/14/2019 15:34   US RENAL ARTERY DUPLEX COMPLETE  Result Date: 08/19/2019 CLINICAL DATA:  85 year old female with a history of hypertension EXAM: RENAL/URINARY TRACT ULTRASOUND RENAL DUPLEX DOPPLER ULTRASOUND COMPARISON:  None. FINDINGS: Right Kidney: Length: 9.1 cm. Echogenicity within normal limits. No hydronephrosis. Focal hypoechoic region of the lateral cortex on the right kidney measures estimated 3.1 cm x 1.8 cm. Recent CT dated 04/22/2019 demonstrates no correlate, and this is a most likely artifactual Anechoic cyst on the lateral cortex, inferior pole right kidney measures less than 2 cm, compatible with Bosniak 1 cyst. Left Kidney: Length: 9.7 cm. Echogenicity similar to the contralateral kidney. No hydronephrosis. Anechoic cyst on the left kidney cortex compatible with Bosniak 1 cyst. 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 Bilateral Bosniak 1 cysts. Signed, Dulcy Fanny. Dellia Nims, RPVI [facility] Radiology Electronically Signed   By: Corrie Mckusick D.O.   On: 08/19/2019 12:13   ECHOCARDIOGRAM COMPLETE  Result Date: 09/03/2019    ECHOCARDIOGRAM REPORT   Patient Name:   KELEE CUNNINGHAM Date of Exam: 09/02/2019 Medical Rec #:  [id number]     Height:       65.0 in Accession #:    [id number]    Weight:       122.7 lb Date of Birth:  March 27, 1935     BSA:           1.607 m Patient Age:    31 years      BP:           105/70 mmHg Patient Gender: F             HR:           65 bpm. Exam Location:  [facility] Procedure: 2D Echo, Cardiac Doppler and Color Doppler Indications:     E72.09 Acute Diastolic CHF  History:         Patient has prior history of Echocardiogram examinations, most                  recent 08/03/2019. Risk Factors:Hypertension and Dyslipidemia.                  Emphysema. COPD. Congestive heart failure. Coronary artery                  disease. Asthma.  Sonographer:     Wilford Sports Rodgers-Jones Referring Phys:  [place] Diagnosing Phys: Serafina Royals MD IMPRESSIONS  1. Left ventricular ejection fraction, by estimation, is 45 to 50%. The left ventricle has mildly decreased function.  The left ventricle has no regional wall motion abnormalities. Left ventricular diastolic function could not be evaluated.  2. Right ventricular systolic function is normal. The right ventricular size is normal. There is mildly elevated pulmonary artery systolic pressure.  3. Left atrial size was moderately dilated.  4. Right atrial size was moderately dilated.  5. Moderate pleural effusion in the left lateral region.  6. The mitral valve is normal in structure. Moderate to severe mitral valve regurgitation.  7. Tricuspid valve regurgitation is mild to moderate.  8. The aortic valve is normal in structure. Aortic valve regurgitation is trivial. FINDINGS  Left Ventricle: Left ventricular ejection fraction, by estimation, is 45 to 50%. The left ventricle has mildly decreased function. The left ventricle has no regional wall motion abnormalities. The left ventricular internal cavity size was normal in size. There is no left ventricular hypertrophy. Left ventricular diastolic function could not be evaluated. Right Ventricle: The right ventricular size is normal. No increase in right ventricular wall thickness. Right ventricular systolic function is normal. There is mildly elevated  pulmonary artery systolic pressure. The tricuspid regurgitant velocity is 2.40  m/s, and with an assumed right atrial pressure of 10 mmHg, the estimated right ventricular systolic pressure is 58.0 mmHg. Left Atrium: Left atrial size was moderately dilated. Right Atrium: Right atrial size was moderately dilated. Pericardium: There is no evidence of pericardial effusion. Mitral Valve: The mitral valve is normal in structure. Moderate to severe mitral valve regurgitation. Tricuspid Valve: The tricuspid valve is normal in structure. Tricuspid valve regurgitation is mild to moderate. Aortic Valve: The aortic valve is normal in structure. Aortic valve regurgitation is trivial. Pulmonic Valve: The pulmonic valve was normal in structure. Pulmonic valve regurgitation is not visualized. Aorta: The aortic root and ascending aorta are structurally normal, with no evidence of dilitation. IAS/Shunts: No atrial level shunt detected by color flow Doppler. Additional Comments: There is a moderate pleural effusion in the left lateral region.  LEFT VENTRICLE PLAX 2D LVIDd:         4.29 cm LVIDs:         2.78 cm LV PW:         1.00 cm LV IVS:        0.69 cm LVOT diam:     1.70 cm LV SV:         31 LV SV Index:   19 LVOT Area:     2.27 cm  RIGHT VENTRICLE RV Basal diam:  3.36 cm RV S prime:     11.90 cm/s TAPSE (M-mode): 1.8 cm LEFT ATRIUM             Index       RIGHT ATRIUM           Index LA diam:        4.90 cm 3.05 cm/m  RA Area:     19.00 cm LA Vol (A2C):   59.1 ml 36.77 ml/m RA Volume:   57.90 ml  36.02 ml/m LA Vol (A4C):   42.4 ml 26.38 ml/m LA Biplane Vol: 51.9 ml 32.29 ml/m  AORTIC VALVE LVOT Vmax:   66.70 cm/s LVOT Vmean:  50.250 cm/s LVOT VTI:    0.138 m  AORTA Ao Root diam: 2.80 cm MV E velocity: 108.67 cm/s  TRICUSPID VALVE                             TR Peak grad:  23.0 mmHg                             TR Vmax:        240.00 cm/s                              SHUNTS                             Systemic VTI:  0.14 m                              Systemic Diam: 1.70 cm Serafina Royals MD Electronically signed by Serafina Royals MD Signature Date/Time: 09/03/2019/12:36:32 PM    Final    US THORACENTESIS ASP PLEURAL SPACE W/IMG GUIDE  Result Date: 08/14/2019 INDICATION: 85 year old female with a history of left-sided pleural effusion EXAM: ULTRASOUND GUIDED LEFT THORACENTESIS MEDICATIONS: None. COMPLICATIONS: None PROCEDURE: An ultrasound guided thoracentesis was thoroughly discussed with the patient and questions answered. The benefits, risks, alternatives and complications were also discussed. The patient understands and wishes to proceed with the procedure. Written consent was obtained. Ultrasound was performed to localize and mark an adequate pocket of fluid in the left chest. The area was then prepped and draped in the normal sterile fashion. 1% Lidocaine was used for local anesthesia. Under ultrasound guidance a 8 Fr Safe-T-Centesis catheter was introduced. Thoracentesis was performed. The catheter was removed and a dressing applied. FINDINGS: A total of approximately 300 cc of thin yellow fluid was removed. The blood-tinged fluid is present secondary to contamination from the initial drain placement. Samples were sent to the laboratory as requested by the clinical team. IMPRESSION: Status post ultrasound-guided left-sided thoracentesis Signed, Dulcy Fanny. Dellia Nims, RPVI [facility] Radiology Electronically Signed   By: Corrie Mckusick D.O.   On: 08/14/2019 15:31     Assessment and Recommendation  85 y.o. female 85 year old female with acute on chronic diastolic dysfunction congestive heart failure multifactorial in nature including atrial fibrillation with rapid ventricular rate chronic kidney disease hypertension hyperlipidemia and hypoxia now slightly improved 1.  Continue diltiazem for heart rate control of atrial fibrillation with a goal heart rate between 60 and 90 bpm  currently still being achieved 2.  Continuation of anticoagulation for further risk reduction in stroke with atrial fibrillation 3.  Continue furosemide drip as able for heart failure management watching closely for chronic kidney disease 4.  No further cardiac diagnostics necessary at this time 5.  Begin cardiac rehabilitation  Signed, Serafina Royals M.D. FACC

## 2019-09-03 NOTE — Progress Notes (Signed)
All medications administered by Judson Roch May Student Nurse were supervised by Linard Millers RN.

## 2019-09-03 NOTE — Consult Note (Addendum)
Consultation Note Date: 09/03/2019   Patient Name: Tina [name]  DOB: 07-17-1934  MRN: [id number]  Age / Sex: 85 y.o., female  PCP: Einar Pheasant, MD Referring Physician: Sharen Hones, MD  Reason for Consultation: Establishing goals of care  HPI/Patient Profile: Tina [name]  is a 85 y.o. female with a known history of CAD, diastolic CHF, COPD, hypertension, CKD is being admitted for acute on chronic kidney disease stage IIIb.  She reports shortness of breath mainly on exertion.   Clinical Assessment and Goals of Care: Patient is resting in bed on BIPAP. Her daughter is at bedside. She has had previous discussions with palliative medicine team.   She states prior to [employer] isolation precautions, she was able to go line dancing. Since Covid she has declined. She states she began having neuropathic pain in her legs and was unable to walk due to pain. She states her QOL has not been acceptable for a long time. She states she takes medications for her COPD that do not make her feel better.   We discussed her diagnosis, prognosis, GOC, EOL wishes disposition and options.  A detailed discussion was had today regarding advanced directives.  Concepts specific to code status, artifical feeding and hydration, IV antibiotics and rehospitalization were discussed.  The difference between an aggressive medical intervention path and a comfort care path was discussed.  Values and goals of care important to patient and family were attempted to be elicited.  Discussed limitations of medical interventions to prolong quality of life in some situations and discussed the concept of human mortality.  Per patient and staff, she was able to be taken off BIPAP long enough to eat, but by the end of the meal needed it immediately replaced. She states she would like to transition to comfort care. She states she told her daughter  1 week ago she wondered about hospice becoming involved, but it is just hitting her that she is dying soon. She states she would like a day or 2 to let this sink in, talk to her family, and make arrangements prior to shifting to comfort care.   If patient requests to be liberated from the BIPAP, she will be too unstable to transport out of the hospital, and her end of life process would occur here with hospital death. She is aware of this. She is aware if she is able to be weaned from the ventilator, she would be a candidate for hospice facility, but understands she would have to show improvement from her current state to be a candidate for transfer to the hospice facility.    If she chooses to shift to comfort tomorrow morning, would recommend allowing 4 visitors today to allow her time with them.     SUMMARY OF RECOMMENDATIONS    Patient is planning to transition to comfort in the next couple days.   Prognosis:   < 2 days without BIPAP.       Primary Diagnoses: Present on Admission: . COPD, severe ([place]) . Leukocytosis .  Atrial fibrillation with RVR ([place]) . Essential hypertension . Rapid atrial fibrillation ([place]) . CKD (chronic kidney disease), stage IV ([place])   I have reviewed the medical record, interviewed the patient and family, and examined the patient. The following aspects are pertinent.  Past Medical History:  Diagnosis Date  . Anemia   . Asthma   . Bladder cancer ([place])   . BRCA positive   . CAD (coronary artery disease)   . CHF (congestive heart failure) ([place])   . COPD (chronic obstructive pulmonary disease) ([place])   . Emphysema lung ([place])   . GERD (gastroesophageal reflux disease)   . History of colon polyps   . Hypercholesterolemia   . Hyperglycemia   . Hypertension   . Lung nodules   . Osteoporosis   . Personal history of tobacco use, presenting hazards to health 11/25/2014  . Polycythemia vera(238.4)   . Renal cyst   . Skin cancer    Social History    Socioeconomic History  . Marital status: Widowed    Spouse name: Not on file  . Number of children: 2  . Years of education: Not on file  . Highest education level: Not on file  Occupational History  . Not on file  Tobacco Use  . Smoking status: Former Smoker    Packs/day: 1.00    Years: 45.00    Pack years: 45.00    Types: Cigarettes    Quit date: 11/11/2003    Years since quitting: 15.8  . Smokeless tobacco: Never Used  Substance and Sexual Activity  . Alcohol use: Not Currently    Alcohol/week: 0.0 standard drinks  . Drug use: No  . Sexual activity: Never  Other Topics Concern  . Not on file  Social History Narrative  . Not on file   Social Determinants of Health   Financial Resource Strain:   . Difficulty of Paying Living Expenses:   Food Insecurity: No Food Insecurity  . Worried About Charity fundraiser in the Last Year: Never true  . Ran Out of Food in the Last Year: Never true  Transportation Needs: No Transportation Needs  . Lack of Transportation (Medical): No  . Lack of Transportation (Non-Medical): No  Physical Activity:   . Days of Exercise per Week:   . Minutes of Exercise per Session:   Stress:   . Feeling of Stress :   Social Connections:   . Frequency of Communication with Friends and Family:   . Frequency of Social Gatherings with Friends and Family:   . Attends Religious Services:   . Active Member of Clubs or Organizations:   . Attends Archivist Meetings:   Marland Kitchen Marital Status:    Family History  Problem Relation Age of Onset  . Cirrhosis Father        died age 37  . Alcohol abuse Father   . Asthma Mother   . Congestive Heart Failure Mother   . Breast cancer Mother        2 (1/2 sisters)  . Osteoarthritis Mother   . Colon cancer Mother   . Lupus Sister   . Alcohol abuse Sister   . Ovarian cancer Sister   . Osteoporosis Sister   . Skin cancer Sister   . Breast cancer Cousin   . Breast cancer Maternal Aunt    Scheduled  Meds: . apixaban  2.5 mg Oral BID  . aspirin EC  81 mg Oral Daily  . cholecalciferol  1,000 Units Oral  Daily  . clopidogrel  75 mg Oral Daily  . diltiazem  240 mg Oral Daily  . gabapentin  200 mg Oral QHS  . hydrALAZINE  50 mg Oral TID  . insulin aspart  0-9 Units Subcutaneous TID WC  . labetalol  200 mg Oral BID  . methylPREDNISolone (SOLU-MEDROL) injection  40 mg Intravenous Q12H  . mometasone-formoterol  2 puff Inhalation BID  . pravastatin  40 mg Oral q1800  . roflumilast  250 mcg Oral Daily  . sodium chloride flush  10 mL Intravenous Q12H  . cyanocobalamin  1,000 mcg Oral Daily   Continuous Infusions: . sodium chloride 250 mL (08/19/2019 2003)  . furosemide (LASIX) infusion     PRN Meds:.sodium chloride, acetaminophen **OR** acetaminophen, fluticasone, ipratropium-albuterol, loperamide, morphine injection, ondansetron **OR** ondansetron (ZOFRAN) IV Medications Prior to Admission:  Prior to Admission medications   Medication Sig Start Date End Date Taking? Authorizing Provider  amLODipine (NORVASC) 10 MG tablet Take 1 tablet (10 mg total) by mouth daily. 08/23/19  Yes Danford, Suann Larry, MD  aspirin EC 81 MG tablet Take 1 tablet (81 mg total) by mouth daily. 01/23/19  Yes Dew, Erskine Squibb, MD  cholecalciferol (VITAMIN D) 1000 units tablet Take 1,000 Units by mouth daily.   Yes [provider]  clopidogrel (PLAVIX) 75 MG tablet Take 1 tablet (75 mg total) by mouth daily. 03/29/18  Yes Einar Pheasant, MD  cyanocobalamin 1000 MCG tablet Take 1,000 mcg by mouth daily.    Yes [provider]  gabapentin (NEURONTIN) 100 MG capsule Take 200 mg by mouth at bedtime.  06/02/19  Yes [provider]  hydrALAZINE (APRESOLINE) 50 MG tablet Take 50 mg by mouth 3 (three) times daily.    Yes [provider]  labetalol (NORMODYNE) 200 MG tablet Take 1 tablet (200 mg total) by mouth 2 (two) times daily. Patient taking differently: Take 200 mg by mouth daily in the  afternoon.  08/22/19  Yes Danford, Suann Larry, MD  losartan (COZAAR) 100 MG tablet Take 1 tablet (100 mg total) by mouth daily. 08/23/19  Yes Danford, Suann Larry, MD  lovastatin (MEVACOR) 40 MG tablet TAKE 1 TABLET BY MOUTH  DAILY IN THE EVENING Patient taking differently: Take 40 mg by mouth every evening.  06/25/19  Yes Einar Pheasant, MD  roflumilast (DALIRESP) 500 MCG TABS tablet Take 0.5 tablets (250 mcg total) by mouth daily. 08/29/19  Yes Max Sane, MD  SYMBICORT 160-4.5 MCG/ACT inhaler USE 2 PUFFS BY MOUTH TWO  TIMES DAILY Patient taking differently: Inhale 2 puffs into the lungs in the morning and at bedtime.  06/30/19  Yes Einar Pheasant, MD  albuterol (PROVENTIL HFA;VENTOLIN HFA) 108 (90 Base) MCG/ACT inhaler Inhale 2 puffs into the lungs every 6 (six) hours as needed for wheezing or shortness of breath. 10/22/17   Bettey Costa, MD  fluticasone (FLONASE) 50 MCG/ACT nasal spray USE 2 SPRAYS INTO BOTH  NOSTRILS DAILY AS NEEDED  FOR ALLERGIES. Patient taking differently: Place 2 sprays into both nostrils daily.  10/01/18   Einar Pheasant, MD  ipratropium-albuterol (DUONEB) 0.5-2.5 (3) MG/3ML SOLN Take 3 mLs by nebulization every 6 (six) hours as needed (for shortness of breath). 11/14/14   Joanne Gavel, MD  loperamide (IMODIUM) 2 MG capsule Take 1 capsule (2 mg total) by mouth as needed for diarrhea or loose stools. 08/22/19   Danford, Suann Larry, MD  torsemide (DEMADEX) 20 MG tablet Take 1 tablet (20 mg total) by mouth daily as needed (  swelling or shortness of breath). 08/06/19   Ezekiel Slocumb, DO   Allergies  Allergen Reactions  . Evista [Raloxifene] Other (See Comments)    Night sweats   . Fluticasone-Salmeterol Other (See Comments)    Cough, "chokes me"    Review of Systems  Constitutional: Positive for activity change and fatigue.  Respiratory: Positive for shortness of breath.     Physical Exam Pulmonary:     Comments: BIPAP Neurological:     Mental Status: She is  alert.     Vital Signs: BP (!) 100/50 (BP Location: Left Arm)   Pulse (!) 56   Temp 97.7 F (36.5 C) (Oral)   Resp (!) 22   Ht '5\' 5"'  (1.651 m)   Wt 60.1 kg   SpO2 98%   BMI 22.03 kg/m  Pain Scale: 0-10   Pain Score: 5    SpO2: SpO2: 98 % O2 Device:SpO2: 98 % O2 Flow Rate: .O2 Flow Rate (L/min): 3 L/min  IO: Intake/output summary:   Intake/Output Summary (Last 24 hours) at 09/03/2019 1405 Last data filed at 09/03/2019 1007 Gross per 24 hour  Intake 306.14 ml  Output 200 ml  Net 106.14 ml    LBM: Last BM Date: 09/02/19 Baseline Weight: Weight: 60 kg Most recent weight: Weight: 60.1 kg     Palliative Assessment/Data:     Time In: 1:10 Time Out: 2:20 Time Total: 70 min Greater than 50%  of this time was spent counseling and coordinating care related to the above assessment and plan.  Signed by: Asencion Gowda, NP   Please contact Palliative Medicine Team phone at [phone] for questions and concerns.  For individual provider: See Shea Evans

## 2019-09-04 DIAGNOSIS — I4891 Unspecified atrial fibrillation: Secondary | ICD-10-CM | POA: Diagnosis not present

## 2019-09-04 DIAGNOSIS — Z7189 Other specified counseling: Secondary | ICD-10-CM | POA: Diagnosis not present

## 2019-09-04 DIAGNOSIS — I5033 Acute on chronic diastolic (congestive) heart failure: Secondary | ICD-10-CM | POA: Diagnosis not present

## 2019-09-04 DIAGNOSIS — Z515 Encounter for palliative care: Secondary | ICD-10-CM | POA: Diagnosis not present

## 2019-09-04 LAB — CBC WITH DIFFERENTIAL/PLATELET
Abs Immature Granulocytes: 0.04 10*3/uL (ref 0.00–0.07)
Basophils Absolute: 0 10*3/uL (ref 0.0–0.1)
Basophils Relative: 0 %
Eosinophils Absolute: 0 10*3/uL (ref 0.0–0.5)
Eosinophils Relative: 0 %
HCT: 33.1 % — ABNORMAL LOW (ref 36.0–46.0)
Hemoglobin: 11.1 g/dL — ABNORMAL LOW (ref 12.0–15.0)
Immature Granulocytes: 0 %
Lymphocytes Relative: 2 %
Lymphs Abs: 0.2 10*3/uL — ABNORMAL LOW (ref 0.7–4.0)
MCH: 30.6 pg (ref 26.0–34.0)
MCHC: 33.5 g/dL (ref 30.0–36.0)
MCV: 91.2 fL (ref 80.0–100.0)
Monocytes Absolute: 0.2 10*3/uL (ref 0.1–1.0)
Monocytes Relative: 2 %
Neutro Abs: 9.8 10*3/uL — ABNORMAL HIGH (ref 1.7–7.7)
Neutrophils Relative %: 96 %
Platelets: 222 10*3/uL (ref 150–400)
RBC: 3.63 MIL/uL — ABNORMAL LOW (ref 3.87–5.11)
RDW: 14.3 % (ref 11.5–15.5)
WBC: 10.3 10*3/uL (ref 4.0–10.5)
nRBC: 0.2 % (ref 0.0–0.2)

## 2019-09-04 LAB — BASIC METABOLIC PANEL
Anion gap: 13 (ref 5–15)
BUN: 72 mg/dL — ABNORMAL HIGH (ref 8–23)
CO2: 24 mmol/L (ref 22–32)
Calcium: 8.8 mg/dL — ABNORMAL LOW (ref 8.9–10.3)
Chloride: 98 mmol/L (ref 98–111)
Creatinine, Ser: 2.35 mg/dL — ABNORMAL HIGH (ref 0.44–1.00)
GFR calc Af Amer: 22 mL/min — ABNORMAL LOW (ref >60)
GFR calc non Af Amer: 19 mL/min — ABNORMAL LOW (ref >60)
Glucose, Bld: 244 mg/dL — ABNORMAL HIGH (ref 70–99)
Potassium: 4.7 mmol/L (ref 3.5–5.1)
Sodium: 135 mmol/L (ref 135–145)

## 2019-09-04 LAB — MAGNESIUM: Magnesium: 2.5 mg/dL — ABNORMAL HIGH (ref 1.7–2.4)

## 2019-09-04 MED ORDER — HALOPERIDOL LACTATE 2 MG/ML PO CONC
0.5000 mg | ORAL | Status: DC | PRN
  Filled 2019-09-04: qty 0.3

## 2019-09-04 MED ORDER — MORPHINE BOLUS VIA INFUSION
2.0000 mg | INTRAVENOUS | Status: DC | PRN
  Administered 2019-09-06: 03:00:00 4 mg via INTRAVENOUS
  Administered 2019-09-06: 2 mg via INTRAVENOUS
  Filled 2019-09-04: qty 4

## 2019-09-04 MED ORDER — HALOPERIDOL 0.5 MG PO TABS
0.5000 mg | ORAL_TABLET | ORAL | Status: DC | PRN
  Filled 2019-09-04: qty 1

## 2019-09-04 MED ORDER — MORPHINE SULFATE (PF) 2 MG/ML IV SOLN
2.0000 mg | INTRAVENOUS | Status: DC | PRN
  Administered 2019-09-04: 11:00:00 2 mg via INTRAVENOUS
  Filled 2019-09-04: qty 1

## 2019-09-04 MED ORDER — SODIUM CHLORIDE 0.9% FLUSH: 3.0000 mL | Freq: Two times a day (BID) | INTRAVENOUS | Status: DC

## 2019-09-04 MED ORDER — LORAZEPAM 1 MG PO TABS: 1.0000 mg | ORAL_TABLET | ORAL | Status: DC | PRN

## 2019-09-04 MED ORDER — MORPHINE 100MG IN NS 100ML (1MG/ML) PREMIX INFUSION
2.0000 mg/h | INTRAVENOUS | Status: DC
  Administered 2019-09-04: 2 mg/h via INTRAVENOUS
  Filled 2019-09-04: qty 100

## 2019-09-04 MED ORDER — LORAZEPAM 2 MG/ML PO CONC
1.0000 mg | ORAL | Status: DC | PRN
  Filled 2019-09-04: qty 0.5

## 2019-09-04 MED ORDER — SODIUM CHLORIDE 0.9% FLUSH: 3.0000 mL | INTRAVENOUS | Status: DC | PRN

## 2019-09-04 MED ORDER — ONDANSETRON 4 MG PO TBDP
4.0000 mg | ORAL_TABLET | Freq: Four times a day (QID) | ORAL | Status: DC | PRN
  Filled 2019-09-04: qty 1

## 2019-09-04 MED ORDER — GLYCOPYRROLATE 1 MG PO TABS
1.0000 mg | ORAL_TABLET | ORAL | Status: DC | PRN
  Filled 2019-09-04: qty 1

## 2019-09-04 MED ORDER — HALOPERIDOL LACTATE 5 MG/ML IJ SOLN: 0.5000 mg | INTRAMUSCULAR | Status: DC | PRN

## 2019-09-04 MED ORDER — POLYVINYL ALCOHOL 1.4 % OP SOLN
1.0000 [drp] | Freq: Four times a day (QID) | OPHTHALMIC | Status: DC | PRN
  Filled 2019-09-04: qty 15

## 2019-09-04 MED ORDER — BIOTENE DRY MOUTH MT LIQD: 15.0000 mL | OROMUCOSAL | Status: DC | PRN

## 2019-09-04 MED ORDER — LORAZEPAM 2 MG/ML IJ SOLN: 0.5000 mg | INTRAMUSCULAR | Status: DC | PRN

## 2019-09-04 MED ORDER — GLYCOPYRROLATE 0.2 MG/ML IJ SOLN
0.2000 mg | INTRAMUSCULAR | Status: DC | PRN
  Filled 2019-09-04: qty 1

## 2019-09-04 MED ORDER — ONDANSETRON HCL 4 MG/2ML IJ SOLN: 4.0000 mg | Freq: Four times a day (QID) | INTRAMUSCULAR | Status: DC | PRN

## 2019-09-04 MED ORDER — ACETAMINOPHEN 650 MG RE SUPP: 650.0000 mg | Freq: Four times a day (QID) | RECTAL | Status: DC | PRN

## 2019-09-04 MED ORDER — ACETAMINOPHEN 325 MG PO TABS: 650.0000 mg | ORAL_TABLET | Freq: Four times a day (QID) | ORAL | Status: DC | PRN

## 2019-09-04 NOTE — Progress Notes (Addendum)
Daily Progress Note   Patient Name: Tina [name]       Date: 09/04/2019 DOB: 28-May-1934  Age: 85 y.o. MRN#: [id number] Attending Physician: Sharen Hones, MD Primary Care Physician: Einar Pheasant, MD Admit Date: 08/24/2019  Reason for Consultation/Follow-up: Establishing goals of care  Subjective: Patient is resting in bed. She has 4 family members at bedside, and BIPAP has been removed for her to eat breakfast, and she does not want it replaced. She states she is feeling short of breath. She states "we've had a good visit, and I'm ready to go." She states she has had conversations with family members as needed and she would like to transition to comfort care at this time. Orders placed for comfort measures including morphine drip with PRN boluses and increasing dose titration perameters, Ativan PRN for anxiety,  Haldol or agitation, Robinul for excessive secretions. O2 as needed for comfort. Respiratory medications left in place if patient chooses to use them. Questions answered.   I completed a MOST form today with patient, with family members at bedside. The signed original was placed in the chart. A photocopy was placed in the chart to be scanned into EMR. The patient outlined their wishes for the following treatment decisions:  Cardiopulmonary Resuscitation: Do Not Attempt Resuscitation (DNR/No CPR)  Medical Interventions: Comfort Measures: Keep clean, warm, and dry. Use medication by any route, positioning, wound care, and other measures to relieve pain and suffering. Use oxygen, suction and manual treatment of airway obstruction as needed for comfort. Do not transfer to the hospital unless comfort needs cannot be met in current location.  Antibiotics: No antibiotics (use other measures to  relieve symptoms)  IV Fluids: No IV fluids (provide other measures to ensure comfort)  Feeding Tube: No feeding tube    ADDENDUM 4:15pm: In to bedside to evaluate symptom management. Family remains at bedside. Patient is resting with eyes closed, but awakens and tells me the year and president. Family states the patient has been very clear, and has discussed with them that she is ready to leave this earth. Asked patient how she is feeling, and she states she feels okay and denies feeling SOB. Her voice is hoarse and she feels the O2 is drying her airway. O2 was placed this morning when BIPAP was removed. She states she does  not want to wear the oxygen any longer and she removed her O2 by [place] and placed it next to her in the bed. She states she was not able to qualify for O2 at home, and does not feel better with the O2 in place.  Plans for staff to continue symptom management. Discussed a small potential for transfer to hospice facility depending on how she does, in the best case scenario. Anticipate likely hospital death.     Length of Stay: 3  Current Medications: Scheduled Meds:  . methylPREDNISolone (SOLU-MEDROL) injection  40 mg Intravenous Q12H  . mometasone-formoterol  2 puff Inhalation BID  . roflumilast  250 mcg Oral Daily  . sodium chloride flush  3 mL Intravenous Q12H    Continuous Infusions: . furosemide (LASIX) infusion 4 mg/hr (09/04/19 0431)  . morphine      PRN Meds: acetaminophen **OR** acetaminophen, antiseptic oral rinse, glycopyrrolate **OR** glycopyrrolate **OR** glycopyrrolate, haloperidol **OR** haloperidol **OR** haloperidol lactate, ipratropium-albuterol, LORazepam **OR** LORazepam **OR** LORazepam, morphine, ondansetron **OR** ondansetron (ZOFRAN) IV, polyvinyl alcohol, sodium chloride flush  Physical Exam Pulmonary:     Comments: Increasing work of breathing.  Neurological:     Mental Status: She is alert.             Vital Signs: BP (!) 158/59 (BP  Location: Right Arm)   Pulse 66   Temp 98.1 F (36.7 C) (Oral)   Resp 20   Ht _0  (1.651 m)   Wt 60.5 kg   SpO2 96%   BMI 22.18 kg/m  SpO2: SpO2: 96 % O2 Device: O2 Device: Bi-PAP O2 Flow Rate: O2 Flow Rate (L/min): 3 L/min  Intake/output summary:   Intake/Output Summary (Last 24 hours) at 09/04/2019 1041 Last data filed at 09/04/2019 0430 Gross per 24 hour  Intake 53.35 ml  Output 400 ml  Net -346.65 ml   LBM: Last BM Date: 09/02/19 Baseline Weight: Weight: 60 kg Most recent weight: Weight: 60.5 kg       Palliative Assessment/Data:      Patient Active Problem List   Diagnosis Date Noted  . Atrial fibrillation with RVR ([place]) 09/04/2019  . Rapid atrial fibrillation ([place]) 08/23/2019  . CKD (chronic kidney disease), stage IV ([place]) 08/29/2019  . Acute renal failure (ARF) ([place]) 08/28/2019  . Palliative care by specialist   . DNR (do not resuscitate) discussion   . Pleural effusion, left 08/17/2019  . Peripheral neuropathy 08/17/2019  . History of pulmonary embolism 08/17/2019  . Dyspnea 08/13/2019  . COPD with acute exacerbation ([place]) 08/02/2019  . Acute on chronic diastolic CHF (congestive heart failure) ([place]) 08/02/2019  . Abnormality of gait 08/02/2019  . Renal artery stenosis ([place]) 07/01/2019  . DOE (dyspnea on exertion) 05/25/2019  . Diarrhea 05/10/2019  . AKI (acute kidney injury) ([place])   . Hypokalemia   . Foot drop 04/02/2019  . Low back pain 03/16/2019  . Leg pain 03/16/2019  . Aortic atherosclerosis ([place]) 01/07/2019  . Hoarseness 06/23/2018  . Unsteadiness 05/11/2018  . CHF (congestive heart failure) ([place]) 10/20/2017  . Pulmonary embolus ([place]) 06/28/2017  . 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 Goals of care, counseling/discussion 03/02/2017  . Abnormal computed tomography of gastrointestinal tract   . Noninfectious diarrhea   . Ulceration of intestine   . Hernia, hiatal   . Bladder mass   . Mesenteric ischemia due to arterial  insufficiency ([place])   . Polycythemia vera ([place]) 10/21/2016  . Elevated troponin 06/14/2016  .  CKD (chronic kidney disease), stage III ([place]) 06/14/2016  . Leukocytosis 06/14/2016  . Abdominal bruit 04/16/2016  . Abnormal mammogram 06/06/2015  . Essential hypertension 02/16/2015  . Numbness 01/24/2015  . Personal history of tobacco use, presenting hazards to health 11/25/2014  . BRCA positive   . COPD exacerbation ([place]) 11/14/2014  . Sensation of pressure in bladder area 08/15/2014  . Health care maintenance 08/15/2014  . Presence of coronary angioplasty implant and graft 09/30/2013  . History of colonic polyps 08/25/2012  . Female genuine stress incontinence 06/28/2012  . Infection of urinary tract 06/28/2012  . CAD (coronary artery disease) 05/23/2012  . COPD, severe ([place]) 05/23/2012  . Hypercholesterolemia 05/23/2012  . Hyperglycemia 05/23/2012  . Polycythemia, secondary 05/23/2012  . Renal cyst 05/23/2012  . Osteoporosis 05/23/2012    Palliative Care Assessment & Plan    Recommendations/Plan:  Comfort orders placed. Recommend primary team to increase and liberalize medications as needed for comfort.   Anticipate a hospital death as patient has required BIPAP and wants to be liberated from the intervention.     Code Status:    Code Status Orders  (From admission, onward)         Start     Ordered   09/04/19 1023  Do not attempt resuscitation (DNR)  Continuous    Question Answer Comment  In the event of cardiac or respiratory ARREST Do not call a "code blue"   In the event of cardiac or respiratory ARREST Do not perform Intubation, CPR, defibrillation or ACLS   In the event of cardiac or respiratory ARREST Use medication by any route, position, wound care, and other measures to relive pain and suffering. May use oxygen, suction and manual treatment of airway obstruction as needed for comfort.   Comments MOST form on chart.      09/04/19 1024        Code  Status History    Date Active Date Inactive Code Status Order ID Comments User Context   09/02/2019 1054 09/04/2019 1024 DNR [id number]  Collier Bullock, MD ED   08/28/2019 1636 08/29/2019 2310 DNR [id number]  Max Sane, MD ED   08/28/2019 1636 08/28/2019 1636 DNR [id number]  Max Sane, MD ED   08/13/2019 1827 08/22/2019 1918 DNR [id number]  Enzo Bi, MD Inpatient   08/02/2019 1627 08/06/2019 2158 DNR [id number]  Collier Bullock, MD ED   08/02/2019 1547 08/02/2019 1627 Full Code [id number]  Collier Bullock, MD ED   05/10/2019 1747 05/14/2019 0108 DNR [id number]  Lorella Nimrod, MD ED   10/21/2017 1101 10/22/2017 1529 DNR [id number]  Bettey Costa, MD Inpatient   10/20/2017 1405 10/21/2017 1101 Full Code [id number]  Idelle Crouch, MD Inpatient   02/11/2017 2018 02/15/2017 1534 DNR [id number]  Fritzi Mandes, MD Inpatient   08/21/2016 2204 08/23/2016 1511 Full Code [id number]  Henreitta Leber, MD Inpatient   06/14/2016 2028 06/17/2016 1621 Full Code [id number]  Theodoro Grist, MD Inpatient   11/14/2014 0857 11/15/2014 1557 Full Code [id number]  Aldean Jewett, MD Inpatient   Advance Care Planning Activity    Advance Directive Documentation     Most Recent Value  Type of Advance Directive  Healthcare Power of Attorney, Living will, Out of facility DNR (pink MOST or yellow form)  Pre-existing out of facility DNR order (yellow form or pink MOST form)  --  "MOST" Form in Place?  --       Prognosis:   Hours - Days Liberation from BIPAP.   Discharge Planning:  Anticipated Hospital Death  Care plan was discussed with RN. Primary MD made aware.   Thank you for allowing the Palliative Medicine Team to assist in the care of this patient.   Time In: 10:00 4:00 Time Out: 10:50 4:20 Total Time 50 min 20 min  Prolonged Time Billed  no      Greater than 50%  of this time was spent counseling and coordinating care related to the above assessment and plan.  Asencion Gowda, NP  Please contact Palliative Medicine Team  phone at [phone] for questions and concerns.

## 2019-09-04 NOTE — Care Management Important Message (Signed)
Important Message  Patient Details  Name: DEMPSEY KNOTEK MRN: [id number] Date of Birth: [birth year]   Medicare Important Message Given:  Other (see comment)  Patient on comfort care measures.  Out of respect for patient and family, no Medicare IM given.     Dannette Barbara 09/04/2019, 12:31 PM

## 2019-09-04 NOTE — Progress Notes (Signed)
[facility] Encounter Note  Patient: Tina [name] / Admit Date: 08/17/2019 / Date of Encounter: 09/04/2019, 8:54 AM   Subjective: Patient still somewhat short of breath weak and fatigued multifactorial in nature including pulmonary issues with resolving acute on chronic diastolic dysfunction heart failure.  Heart rate much better controlled at this time with oral diltiazem at 240 mg and labetalol.   Patient now placed on anticoagulation with Eliquis without evidence of bleeding complication Lasix drip is appearing to work relatively well but still has basilar crackles and shortness of breath with some more input than output today Echocardiogram with valvular heart disease atrial enlargement and normal LV function consistent with diastolic dysfunction heart failure Review of Systems: Positive for: Shortness of breath Negative for: Vision change, hearing change, syncope, dizziness, nausea, vomiting,diarrhea, bloody stool, stomach pain, cough, congestion, diaphoresis, urinary frequency, urinary pain,skin lesions, skin rashes Others previously listed  Objective: Telemetry: Atrial fibrillation with controlled ventricular rate Physical Exam: Blood pressure (!) 158/59, pulse 66, temperature 98.1 F (36.7 C), temperature source Oral, resp. rate 20, height 5\' 5"  (1.651 m), weight 60.5 kg, SpO2 96 %. Body mass index is 22.18 kg/m. General: Well developed, well nourished, in no acute distress. Head: Normocephalic, atraumatic, sclera non-icteric, no xanthomas, nares are without discharge. Neck: No apparent masses Lungs: Normal respirations with some wheezes, few rhonchi, no rales , no crackles   Heart: Irregular rate and rhythm, normal S1 S2, no murmur, no rub, no gallop, PMI is normal size and placement, carotid upstroke normal without bruit, jugular venous pressure normal Abdomen: Soft, non-tender, non-distended with normoactive bowel sounds. No hepatosplenomegaly. Abdominal  aorta is normal size without bruit Extremities: Trace edema, no clubbing, no cyanosis, no ulcers,  Peripheral: 2+ radial, 2+ femoral, 2+ dorsal pedal pulses Neuro: Alert and oriented. Moves all extremities spontaneously. Psych:  Responds to questions appropriately with a normal affect.   Intake/Output Summary (Last 24 hours) at 09/04/2019 0854 Last data filed at 09/04/2019 0430 Gross per 24 hour  Intake 173.35 ml  Output 400 ml  Net -226.65 ml    Inpatient Medications:  . apixaban  2.5 mg Oral BID  . aspirin EC  81 mg Oral Daily  . cholecalciferol  1,000 Units Oral Daily  . clopidogrel  75 mg Oral Daily  . diltiazem  240 mg Oral Daily  . gabapentin  200 mg Oral QHS  . hydrALAZINE  50 mg Oral TID  . labetalol  200 mg Oral BID  . methylPREDNISolone (SOLU-MEDROL) injection  40 mg Intravenous Q12H  . mometasone-formoterol  2 puff Inhalation BID  . pravastatin  40 mg Oral q1800  . roflumilast  250 mcg Oral Daily  . sodium chloride flush  10 mL Intravenous Q12H  . cyanocobalamin  1,000 mcg Oral Daily   Infusions:  . sodium chloride 250 mL (08/22/2019 2003)  . furosemide (LASIX) infusion 4 mg/hr (09/04/19 0431)    Labs: Recent Labs    09/03/19 0602 09/04/19 0447  NA 133* 135  K 4.5 4.7  CL 99 98  CO2 24 24  GLUCOSE 227* 244*  BUN 63* 72*  CREATININE 2.44* 2.35*  CALCIUM 8.6* 8.8*  MG  --  2.5*   No results for input(s): AST, ALT, ALKPHOS, BILITOT, PROT, ALBUMIN in the last 72 hours. Recent Labs    09/02/19 0447 09/04/19 0447  WBC 15.8* 10.3  NEUTROABS  --  9.8*  HGB 11.6* 11.1*  HCT 34.4* 33.1*  MCV 91.7 91.2  PLT 211  222   No results for input(s): CKTOTAL, CKMB, TROPONINI in the last 72 hours. Invalid input(s): POCBNP Recent Labs    09/03/19 0602  HGBA1C 6.0*     Weights: Filed Weights   09/09/2019 1939 09/03/19 0512 09/04/19 0358  Weight: 55.7 kg 60.1 kg 60.5 kg     Radiology/Studies:  DG Chest 2 View  Result Date: 08/13/2019 CLINICAL DATA:   Shortness of breath EXAM: CHEST - 2 VIEW COMPARISON:  08/02/2019 FINDINGS: The heart size and mediastinal contours are stable. Atherosclerotic calcification of the aortic knob. Moderate left pleural effusion, increased from prior. Hyper expanded lungs with chronically coarsened interstitial markings. No pneumothorax. Exaggerated thoracic kyphosis. IMPRESSION: 1. Moderate left pleural effusion, increased from prior. 2. COPD. Electronically Signed   By: Davina Poke D.O.   On: 08/13/2019 13:16   US RENAL  Result Date: 08/05/2019 CLINICAL DATA:  Acute kidney injury. EXAM: RENAL / URINARY TRACT ULTRASOUND COMPLETE COMPARISON:  04/22/2019 FINDINGS: Right Kidney: Renal measurements: 9.6 x 3.6 x 4.1 cm = volume: 74.4 mL. No hydronephrosis. Echogenicity normal. There is a upper pole renal sinus cyst measuring 2 x 1.9 x 1.7 cm. Left Kidney: Renal measurements: 9.3 x 3.9 x 4.1 cm = volume: 77.0 mL. Large exophytic cyst arising from the lateral cortex of the left mid kidney measures 6.6 x 5.5 x 6.0 cm. No hydronephrosis. Normal echogenicity. Bladder: Appears normal for degree of bladder distention. Other: None. IMPRESSION: 1. No acute findings.  No hydronephrosis. 2. Bilateral kidney cysts. Electronically Signed   By: Kerby Moors M.D.   On: 08/05/2019 11:09   DG Chest Port 1 View  Result Date: 09/11/2019 CLINICAL DATA:  Shortness of breath and tachycardia. EXAM: PORTABLE CHEST 1 VIEW COMPARISON:  08/28/2019 FINDINGS: External pacer paddles are noted. The cardiac silhouette, mediastinal and hilar contours are within normal limits and stable. Stable tortuosity and calcification of the thoracic aorta. Moderate central vascular congestion and interstitial pulmonary edema along with a persistent left pleural effusion and left lower lobe atelectasis. No pneumothorax. The bony thorax is intact. IMPRESSION: CHF with left pleural effusion and left lower lobe atelectasis. Electronically Signed   By: Marijo Sanes M.D.    On: 09/05/2019 07:19   DG Chest Portable 1 View  Result Date: 08/28/2019 CLINICAL DATA:  Shortness of breath. EXAM: PORTABLE CHEST 1 VIEW COMPARISON:  August 16, 2019. FINDINGS: Stable cardiomegaly. No pneumothorax is noted. Right lung is clear. Mild left basilar atelectasis or infiltrate is noted with associated effusion. Bony thorax is unremarkable. IMPRESSION: Mild left basilar atelectasis or infiltrate is noted with associated pleural effusion. Electronically Signed   By: Marijo Conception M.D.   On: 08/28/2019 13:24   DG Chest Port 1 View  Result Date: 08/16/2019 CLINICAL DATA:  Dyspnea EXAM: PORTABLE CHEST 1 VIEW COMPARISON:  08/02/2019 FINDINGS: Increased small to moderate left pleural effusion and left basilar atelectasis. Similar small right pleural effusion. Persistent interstitial prominence likely reflecting mild edema superimposed chronic changes. Similar cardiomediastinal contours. IMPRESSION: Increased small to moderate left pleural effusion with associated left basilar atelectasis. Similar small right pleural effusion. Persistent probable mild pulmonary edema superimposed on chronic interstitial prominence. Electronically Signed   By: Macy Mis M.D.   On: 08/16/2019 16:28   DG Chest Port 1 View  Result Date: 08/14/2019 CLINICAL DATA:  85 year old female status post thoracentesis EXAM: PORTABLE CHEST 1 VIEW COMPARISON:  08/13/2019 FINDINGS: Cardiomediastinal silhouette unchanged in size and contour. Evidence of prior PTCA. Mild interlobular septal thickening, seems increased  from the prior. Coarsened interstitial markings persist from the prior. Decreased opacity at the left lung base with persistent blunting at the left costophrenic angle. No pneumothorax. Minimal blunting at the right costophrenic angle. IMPRESSION: Improved left-sided pleural effusion status post thoracentesis. No pneumothorax. New trace right-sided pleural fluid. Early pulmonary edema. Electronically Signed   By: Corrie Mckusick D.O.   On: 08/14/2019 15:34   US RENAL ARTERY DUPLEX COMPLETE  Result Date: 08/19/2019 CLINICAL DATA:  85 year old female with a history of hypertension EXAM: RENAL/URINARY TRACT ULTRASOUND RENAL DUPLEX DOPPLER ULTRASOUND COMPARISON:  None. FINDINGS: Right Kidney: Length: 9.1 cm. Echogenicity within normal limits. No hydronephrosis. Focal hypoechoic region of the lateral cortex on the right kidney measures estimated 3.1 cm x 1.8 cm. Recent CT dated 04/22/2019 demonstrates no correlate, and this is a most likely artifactual Anechoic cyst on the lateral cortex, inferior pole right kidney measures less than 2 cm, compatible with Bosniak 1 cyst. Left Kidney: Length: 9.7 cm. Echogenicity similar to the contralateral kidney. No hydronephrosis. Anechoic cyst on the left kidney cortex compatible with Bosniak 1 cyst. 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 Bilateral Bosniak 1 cysts. Signed, Dulcy Fanny. Dellia Nims, RPVI [facility] Electronically Signed   By: Corrie Mckusick D.O.   On: 08/19/2019 12:13   ECHOCARDIOGRAM COMPLETE  Result Date: 09/03/2019    ECHOCARDIOGRAM REPORT   Patient Name:   Tina [name] Date of Exam: 09/02/2019 Medical Rec #:  [id number]     Height:       65.0 in Accession #:    [id number]    Weight:       122.7 lb Date of Birth:  1934/12/02     BSA:          1.607 m Patient Age:    44 years       BP:           105/70 mmHg Patient Gender: F             HR:           65 bpm. Exam Location:  [facility] Procedure: 2D Echo, Cardiac Doppler and Color Doppler Indications:     D35.70 Acute Diastolic CHF  History:         Patient has prior history of Echocardiogram examinations, most                  recent 08/03/2019. Risk Factors:Hypertension and Dyslipidemia.                  Emphysema. COPD. Congestive heart failure. Coronary artery                  disease. Asthma.  Sonographer:     Wilford Sports Rodgers-Jones Referring Phys:  Sal[place] Diagnosing Phys: Serafina Royals MD IMPRESSIONS  1. Left ventricular ejection fraction, by estimation, is 45 to 50%. The left ventricle has mildly decreased function. The left  ventricle has no regional wall motion abnormalities. Left ventricular diastolic function could not be evaluated.  2. Right ventricular systolic function is normal. The right ventricular size is normal. There is mildly elevated pulmonary artery systolic pressure.  3. Left atrial size was moderately dilated.  4. Right atrial size was moderately dilated.  5. Moderate pleural effusion in the left lateral region.  6. The mitral valve is normal in structure. Moderate to severe mitral valve regurgitation.  7. Tricuspid valve regurgitation is mild to moderate.  8. The aortic valve is normal in structure. Aortic valve regurgitation is trivial. FINDINGS  Left Ventricle: Left ventricular ejection fraction, by estimation, is 45 to 50%. The left ventricle has mildly decreased function. The left ventricle has no regional wall motion abnormalities. The left ventricular internal cavity size was normal in size. There is no left ventricular hypertrophy. Left ventricular diastolic function could not be evaluated. Right Ventricle: The right ventricular size is normal. No increase in right ventricular wall thickness. Right ventricular systolic function is normal. There is mildly elevated pulmonary artery systolic pressure. The  tricuspid regurgitant velocity is 2.40  m/s, and with an assumed right atrial pressure of 10 mmHg, the estimated right ventricular systolic pressure is 23.7 mmHg. Left Atrium: Left atrial size was moderately dilated. Right Atrium: Right atrial size was moderately dilated. Pericardium: There is no evidence of pericardial effusion. Mitral Valve: The mitral valve is normal in structure. Moderate to severe mitral valve regurgitation. Tricuspid Valve: The tricuspid valve is normal in structure. Tricuspid valve regurgitation is mild to moderate. Aortic Valve: The aortic valve is normal in structure. Aortic valve regurgitation is trivial. Pulmonic Valve: The pulmonic valve was normal in structure. Pulmonic valve regurgitation is not visualized. Aorta: The aortic root and ascending aorta are structurally normal, with no evidence of dilitation. IAS/Shunts: No atrial level shunt detected by color flow Doppler. Additional Comments: There is a moderate pleural effusion in the left lateral region.  LEFT VENTRICLE PLAX 2D LVIDd:         4.29 cm LVIDs:         2.78 cm LV PW:         1.00 cm LV IVS:        0.69 cm LVOT diam:     1.70 cm LV SV:         31 LV SV Index:   19 LVOT Area:     2.27 cm  RIGHT VENTRICLE RV Basal diam:  3.36 cm RV S prime:     11.90 cm/s TAPSE (M-mode): 1.8 cm LEFT ATRIUM             Index       RIGHT ATRIUM           Index LA diam:        4.90 cm 3.05 cm/m  RA Area:     19.00 cm LA Vol (A2C):   59.1 ml 36.77 ml/m RA Volume:   57.90 ml  36.02 ml/m LA Vol (A4C):   42.4 ml 26.38 ml/m LA Biplane Vol: 51.9 ml 32.29 ml/m  AORTIC VALVE LVOT Vmax:   66.70 cm/s LVOT Vmean:  50.250 cm/s LVOT VTI:    0.138 m  AORTA Ao Root diam: 2.80 cm MV E velocity: 108.67 cm/s  TRICUSPID VALVE                             TR Peak grad:   23.0 mmHg  TR Vmax:        240.00 cm/s                              SHUNTS                             Systemic VTI:  0.14 m                             Systemic  Diam: 1.70 cm Serafina Royals MD Electronically signed by Serafina Royals MD Signature Date/Time: 09/03/2019/12:36:32 PM    Final    US THORACENTESIS ASP PLEURAL SPACE W/IMG GUIDE  Result Date: 08/14/2019 INDICATION: 85 year old female with a history of left-sided pleural effusion EXAM: ULTRASOUND GUIDED LEFT THORACENTESIS MEDICATIONS: None. COMPLICATIONS: None PROCEDURE: An ultrasound guided thoracentesis was thoroughly discussed with the patient and questions answered. The benefits, risks, alternatives and complications were also discussed. The patient understands and wishes to proceed with the procedure. Written consent was obtained. Ultrasound was performed to localize and mark an adequate pocket of fluid in the left chest. The area was then prepped and draped in the normal sterile fashion. 1% Lidocaine was used for local anesthesia. Under ultrasound guidance a 8 Fr Safe-T-Centesis catheter was introduced. Thoracentesis was performed. The catheter was removed and a dressing applied. FINDINGS: A total of approximately 300 cc of thin yellow fluid was removed. The blood-tinged fluid is present secondary to contamination from the initial drain placement. Samples were sent to the laboratory as requested by the clinical team. IMPRESSION: Status post ultrasound-guided left-sided thoracentesis Signed, Dulcy Fanny. Dellia Nims, RPVI [facility] Electronically Signed   By: Corrie Mckusick D.O.   On: 08/14/2019 15:31     Assessment and Recommendation  85 y.o. female 85 year old female with acute on chronic diastolic dysfunction congestive heart failure multifactorial in nature including atrial fibrillation with rapid ventricular rate chronic kidney disease hypertension hyperlipidemia and hypoxia now slightly improved but has mild concerns for acute kidney injury 1.  Continue diltiazem for heart rate control of atrial fibrillation with a goal heart rate between 60 and  90 bpm currently still being achieved without need change 2.  Continuation of anticoagulation for further risk reduction in stroke with atrial fibrillation which appears to be stable 3.  Continue furosemide drip as able for heart failure management with possible acute kidney injury and would defer to nephrology for treatment and diuresis for congestive heart failure under current conditions with change to oral Lasix and/or torsemide when nephrology says appropriate 4.  No further cardiac diagnostics necessary at this time 5.  Begin cardiac rehabilitation 6.  Okay for discharge home when diuresis improved through treatment options as per nephrology  Signed, Serafina Royals M.D. FACC

## 2019-09-04 NOTE — Progress Notes (Signed)
Asked to see patient for underlying acute kidney injury on chronic kidney disease stage III.  However it appears that patient has been made comfort care now.  Therefore we will sign off.  Please call back if any concerns.

## 2019-09-04 NOTE — Progress Notes (Signed)
PROGRESS NOTE    Tina [name]  OZD:[id number] DOB: [birth year] DOA: 08/20/2019 PCP: Einar Pheasant, MD (Confirm with patient/family/NH records and if not entered, this HAS to be entered at [facility] point of entry. "No PCP" if truly none.)   Brief Narrative: (Start on day 1 of progress note - keep it brief and live) Patient is a 85 year old female with history of COPD with chronic hypoxemic restaurant failure, on 2 L oxygen, chronic diastolic congestive heart failure, chronic kidney disease stage IIIb, coronary disease, hypertension, who was admitted to the hospital with atrial fibrillation and a rapid ventricular response, acute on chronic hypoxemic restaurant failure.  She has been giving IV Lasix, she was also started on IV steroids for COPD exacerbation.  Chest x-ray showed changes consistent with CHF exacerbation. Her condition does not seem to be improving.  She has been on BiPAP on and off since 4/20.    Assessment & Plan:   Principal Problem:   Atrial fibrillation with RVR (HCC) Active Problems:   COPD, severe (HCC)   Essential hypertension   Leukocytosis   CHF (congestive heart failure) (HCC)   Rapid atrial fibrillation (HCC)   CKD (chronic kidney disease), stage IV ([place])  Patient is seen by palliative care team, after discussing with patient and family, they wish to pursue comfort care only.  Patient will be continued cared for in the hospital until she passed.  Orders for comfort care placed.   .    Consultants:   Palliative care  Procedures: None Antimicrobials: (None  Subjective: Patient took off BiPAP to eat this morning.  Since then, she has not been requiring BiPAP.  She still has short of breath with minimal exertion.  No cough.  No nausea vomiting.  No diarrhea.  Objective: Vitals:   09/03/19 1933 09/04/19 0358 09/04/19 0728 09/04/19 1112  BP: 135/60 133/62 (!) 158/59 139/65  Pulse: 81 61 66 81  Resp: (!) 24 (!) 23 20 16   Temp: 98.9 F (37.2 C) 97.8 F  (36.6 C) 98.1 F (36.7 C) 97.8 F (36.6 C)  TempSrc: Oral Oral Oral Oral  SpO2: 97% 98% 96% 97%  Weight:  60.5 kg    Height:        Intake/Output Summary (Last 24 hours) at 09/04/2019 1118 Last data filed at 09/04/2019 1113 Gross per 24 hour  Intake 413.35 ml  Output 700 ml  Net -286.65 ml   Filed Weights   08/18/2019 1939 09/03/19 0512 09/04/19 0358  Weight: 55.7 kg 60.1 kg 60.5 kg    Examination:  General exam: Appears calm and comfortable  Respiratory system: Significant decrease of breathing sounds, no crackles today. Cardiovascular system: S1 & S2 heard, RRR. No JVD, murmurs, rubs, gallops or clicks. Trace pedal edema. Gastrointestinal system: Abdomen is nondistended, soft and nontender. No organomegaly or masses felt. Normal bowel sounds heard. Central nervous system: Alert and oriented. No focal neurological deficits. Extremities: Symmetric 5 x 5 power. Skin: No rashes, lesions or ulcers Psychiatry: Judgement and insight appear normal. Mood & affect appropriate.     Data Reviewed: I have personally reviewed following labs and imaging studies  CBC: Recent Labs  Lab 08/28/19 1304 08/29/19 0442 08/24/2019 0631 09/02/19 0447 09/04/19 0447  WBC 12.2* 7.8 21.6* 15.8* 10.3  NEUTROABS  --   --  18.2*  --  9.8*  HGB 12.7 11.9* 13.1 11.6* 11.1*  HCT 38.8 35.6* 38.5 34.4* 33.1*  MCV 94.2 92.2 90.6 91.7 91.2  PLT 199 184 245 211 222  Basic Metabolic Panel: Recent Labs  Lab 08/29/19 0442 08/21/2019 1026 09/02/19 0447 09/03/19 0602 09/04/19 0447  NA 140 138 138 133* 135  K 4.0 4.1 5.2* 4.5 4.7  CL 102 101 100 99 98  CO2 28 19* 22 24 24   GLUCOSE 107* 189* 131* 227* 244*  BUN 34* 26* 43* 63* 72*  CREATININE 1.20* 1.50* 2.30* 2.44* 2.35*  CALCIUM 9.2 9.6 9.2 8.6* 8.8*  MG  --   --   --   --  2.5*   GFR: Estimated Creatinine Clearance: 16.6 mL/min (A) (by C-G formula based on SCr of 2.35 mg/dL (H)). Liver Function Tests: Recent Labs  Lab 08/28/19 1304  AST  13*  ALT 15  ALKPHOS 56  BILITOT 1.3*  PROT 6.1*  ALBUMIN 3.5   No results for input(s): LIPASE, AMYLASE in the last 168 hours. No results for input(s): AMMONIA in the last 168 hours. Coagulation Profile: Recent Labs  Lab 09/09/2019 2039  INR 1.1   Cardiac Enzymes: No results for input(s): CKTOTAL, CKMB, CKMBINDEX, TROPONINI in the last 168 hours. BNP (last 3 results) No results for input(s): PROBNP in the last 8760 hours. HbA1C: Recent Labs    09/03/19 0602  HGBA1C 6.0*   CBG: Recent Labs  Lab 08/29/19 0757 09/03/19 1656  GLUCAP 119* 237*   Lipid Profile: No results for input(s): CHOL, HDL, LDLCALC, TRIG, CHOLHDL, LDLDIRECT in the last 72 hours. Thyroid Function Tests: No results for input(s): TSH, T4TOTAL, FREET4, T3FREE, THYROIDAB in the last 72 hours. Anemia Panel: No results for input(s): VITAMINB12, FOLATE, FERRITIN, TIBC, IRON, RETICCTPCT in the last 72 hours. Sepsis Labs: No results for input(s): PROCALCITON, LATICACIDVEN in the last 168 hours.  Recent Results (from the past 240 hour(s))  SARS CORONAVIRUS 2 (TAT 6-24 HRS) Nasopharyngeal Nasopharyngeal Swab     Status: None   Collection Time: 08/28/19  4:13 PM   Specimen: Nasopharyngeal Swab  Result Value Ref Range Status   SARS Coronavirus 2 NEGATIVE NEGATIVE Final    Comment: (NOTE) SARS-CoV-2 target nucleic acids are NOT DETECTED. The SARS-CoV-2 RNA is generally detectable in upper and lower respiratory specimens during the acute phase of infection. Negative results do not preclude SARS-CoV-2 infection, do not rule out co-infections with other pathogens, and should not be used as the sole basis for treatment or other patient management decisions. Negative results must be combined with clinical observations, patient history, and epidemiological information. The expected result is Negative. Fact Sheet for Patients: SugarRoll.be Fact Sheet for Healthcare  Providers: https://www.woods-mathews.com/ This test is not yet approved or cleared by the [place] FDA and  has been authorized for detection and/or diagnosis of SARS-CoV-2 by FDA under an Emergency Use Authorization (EUA). This EUA will remain  in effect (meaning this test can be used) for the duration of the COVID-19 declaration under Section 56 4(b)(1) of the Act, 21 U.S.C. section 360bbb-3(b)(1), unless the authorization is terminated or revoked sooner. Performed at [facility], [place] [address], [place],  [address]   Respiratory Panel by RT PCR (Flu A&B, Covid) - Nasopharyngeal Swab     Status: None   Collection Time: 09/11/2019 11:29 AM   Specimen: Nasopharyngeal Swab  Result Value Ref Range Status   SARS Coronavirus 2 by RT PCR NEGATIVE NEGATIVE Final    Comment: (NOTE) SARS-CoV-2 target nucleic acids are NOT DETECTED. The SARS-CoV-2 RNA is generally detectable in upper respiratoy specimens during the acute phase of infection. The lowest concentration of SARS-CoV-2 viral  copies this assay can detect is 131 copies/mL. A negative result does not preclude SARS-Cov-2 infection and should not be used as the sole basis for treatment or other patient management decisions. A negative result may occur with  improper specimen collection/handling, submission of specimen other than nasopharyngeal swab, presence of viral mutation(s) within the areas targeted by this assay, and inadequate number of viral copies (<131 copies/mL). A negative result must be combined with clinical observations, patient history, and epidemiological information. The expected result is Negative. Fact Sheet for Patients:  PinkCheek.be Fact Sheet for Healthcare Providers:  GravelBags.it This test is not yet ap proved or cleared by the [place] FDA and  has been authorized for detection and/or diagnosis of SARS-CoV-2 by FDA  under an Emergency Use Authorization (EUA). This EUA will remain  in effect (meaning this test can be used) for the duration of the COVID-19 declaration under Section 564(b)(1) of the Act, 21 U.S.C. section 360bbb-3(b)(1), unless the authorization is terminated or revoked sooner.    Influenza A by PCR NEGATIVE NEGATIVE Final   Influenza B by PCR NEGATIVE NEGATIVE Final    Comment: (NOTE) The Xpert Xpress SARS-CoV-2/FLU/RSV assay is intended as an aid in  the diagnosis of influenza from Nasopharyngeal swab specimens and  should not be used as a sole basis for treatment. Nasal washings and  aspirates are unacceptable for Xpert Xpress SARS-CoV-2/FLU/RSV  testing. Fact Sheet for Patients: PinkCheek.be Fact Sheet for Healthcare Providers: GravelBags.it This test is not yet approved or cleared by the [place] FDA and  has been authorized for detection and/or diagnosis of SARS-CoV-2 by  FDA under an Emergency Use Authorization (EUA). This EUA will remain  in effect (meaning this test can be used) for the duration of the  Covid-19 declaration under Section 564(b)(1) of the Act, 21  U.S.C. section 360bbb-3(b)(1), unless the authorization is  terminated or revoked. Performed at [facility], [address] [place], [place] [address]          Radiology Studies: ECHOCARDIOGRAM COMPLETE  Result Date: 09/03/2019    ECHOCARDIOGRAM REPORT   Patient Name:   Tina [name] Date of Exam: 09/02/2019 Medical Rec #:  [id number]     Height:       65.0 in Accession #:    [id number]    Weight:       122.7 lb Date of Birth:  09-Jul-1934     BSA:          1.607 m Patient Age:    9 years      BP:           105/70 mmHg Patient Gender: F             HR:           65 bpm. Exam Location:  [facility] Procedure: 2D Echo, Cardiac Doppler and Color Doppler Indications:     P29.51 Acute Diastolic CHF  History:         Patient has prior history of  Echocardiogram examinations, most                  recent 08/03/2019. Risk Factors:Hypertension and Dyslipidemia.                  Emphysema. COPD. Congestive heart failure. Coronary artery                  disease. Asthma.  Sonographer:     Wilford Sports Rodgers-Jones Referring Phys:  [id number]  [place] Diagnosing Phys: Serafina Royals MD IMPRESSIONS  1. Left ventricular ejection fraction, by estimation, is 45 to 50%. The left ventricle has mildly decreased function. The left ventricle has no regional wall motion abnormalities. Left ventricular diastolic function could not be evaluated.  2. Right ventricular systolic function is normal. The right ventricular size is normal. There is mildly elevated pulmonary artery systolic pressure.  3. Left atrial size was moderately dilated.  4. Right atrial size was moderately dilated.  5. Moderate pleural effusion in the left lateral region.  6. The mitral valve is normal in structure. Moderate to severe mitral valve regurgitation.  7. Tricuspid valve regurgitation is mild to moderate.  8. The aortic valve is normal in structure. Aortic valve regurgitation is trivial. FINDINGS  Left Ventricle: Left ventricular ejection fraction, by estimation, is 45 to 50%. The left ventricle has mildly decreased function. The left ventricle has no regional wall motion abnormalities. The left ventricular internal cavity size was normal in size. There is no left ventricular hypertrophy. Left ventricular diastolic function could not be evaluated. Right Ventricle: The right ventricular size is normal. No increase in right ventricular wall thickness. Right ventricular systolic function is normal. There is mildly elevated pulmonary artery systolic pressure. The tricuspid regurgitant velocity is 2.40  m/s, and with an assumed right atrial pressure of 10 mmHg, the estimated right ventricular systolic pressure is 77.8 mmHg. Left Atrium: Left atrial size was moderately dilated. Right Atrium: Right atrial  size was moderately dilated. Pericardium: There is no evidence of pericardial effusion. Mitral Valve: The mitral valve is normal in structure. Moderate to severe mitral valve regurgitation. Tricuspid Valve: The tricuspid valve is normal in structure. Tricuspid valve regurgitation is mild to moderate. Aortic Valve: The aortic valve is normal in structure. Aortic valve regurgitation is trivial. Pulmonic Valve: The pulmonic valve was normal in structure. Pulmonic valve regurgitation is not visualized. Aorta: The aortic root and ascending aorta are structurally normal, with no evidence of dilitation. IAS/Shunts: No atrial level shunt detected by color flow Doppler. Additional Comments: There is a moderate pleural effusion in the left lateral region.  LEFT VENTRICLE PLAX 2D LVIDd:         4.29 cm LVIDs:         2.78 cm LV PW:         1.00 cm LV IVS:        0.69 cm LVOT diam:     1.70 cm LV SV:         31 LV SV Index:   19 LVOT Area:     2.27 cm  RIGHT VENTRICLE RV Basal diam:  3.36 cm RV S prime:     11.90 cm/s TAPSE (M-mode): 1.8 cm LEFT ATRIUM             Index       RIGHT ATRIUM           Index LA diam:        4.90 cm 3.05 cm/m  RA Area:     19.00 cm LA Vol (A2C):   59.1 ml 36.77 ml/m RA Volume:   57.90 ml  36.02 ml/m LA Vol (A4C):   42.4 ml 26.38 ml/m LA Biplane Vol: 51.9 ml 32.29 ml/m  AORTIC VALVE LVOT Vmax:   66.70 cm/s LVOT Vmean:  50.250 cm/s LVOT VTI:    0.138 m  AORTA Ao Root diam: 2.80 cm MV E velocity: 108.67 cm/s  TRICUSPID VALVE  TR Peak grad:   23.0 mmHg                             TR Vmax:        240.00 cm/s                              SHUNTS                             Systemic VTI:  0.14 m                             Systemic Diam: 1.70 cm Serafina Royals MD Electronically signed by Serafina Royals MD Signature Date/Time: 09/03/2019/12:36:32 PM    Final         Scheduled Meds: . methylPREDNISolone (SOLU-MEDROL) injection  40 mg Intravenous Q12H  .  mometasone-formoterol  2 puff Inhalation BID  . roflumilast  250 mcg Oral Daily  . sodium chloride flush  3 mL Intravenous Q12H   Continuous Infusions: . morphine       LOS: 3 days    Time spent: 25 minutes    Sharen Hones, MD [facility]   To contact the attending provider between 7A-7P or the covering provider during after hours 7P-7A, please log into the web site [email or web address] and access using universal [place] password for that web site. If you do not have the password, please call the hospital operator.  09/04/2019, 11:18 AM

## 2019-09-05 DIAGNOSIS — Z66 Do not resuscitate: Secondary | ICD-10-CM | POA: Diagnosis not present

## 2019-09-05 DIAGNOSIS — Z515 Encounter for palliative care: Secondary | ICD-10-CM | POA: Diagnosis not present

## 2019-09-05 DIAGNOSIS — J441 Chronic obstructive pulmonary disease with (acute) exacerbation: Secondary | ICD-10-CM | POA: Diagnosis not present

## 2019-09-05 DIAGNOSIS — I5033 Acute on chronic diastolic (congestive) heart failure: Secondary | ICD-10-CM | POA: Diagnosis not present

## 2019-09-05 DIAGNOSIS — I4891 Unspecified atrial fibrillation: Secondary | ICD-10-CM | POA: Diagnosis not present

## 2019-09-05 DIAGNOSIS — J449 Chronic obstructive pulmonary disease, unspecified: Secondary | ICD-10-CM | POA: Diagnosis not present

## 2019-09-05 NOTE — Progress Notes (Signed)
PROGRESS NOTE    Tina [name]  NWG:[id number] DOB: Mar 13, 1935 DOA: 08/24/2019 PCP: Einar Pheasant, MD (Confirm with patient/family/NH records and if not entered, this HAS to be entered at [facility] point of entry. "No PCP" if truly none.)   Brief Narrative: (Start on day 1 of progress note - keep it brief and live) Patient is a 85 year old female with history of COPD with chronic hypoxemic restaurant failure, on 2 L oxygen, chronic diastolic congestive heart failure, chronic kidney disease stage IIIb, coronary disease, hypertension, who was admitted to the hospital with atrial fibrillation and a rapid ventricular response, acute on chronic hypoxemic restaurant failure. She has been giving IV Lasix, she was also started on IV steroids for COPD exacerbation. Chest x-ray showed changes consistent with CHF exacerbation. Her condition does not seem to be improving. She has been on BiPAP on and off since 4/20. Patient has been seen by palliative care, she is currently comfort care only.  All active treatment has been discontinued.   Assessment & Plan:   Principal Problem:   Atrial fibrillation with RVR (HCC) Active Problems:   COPD, severe (HCC)   Essential hypertension   Leukocytosis   CHF (congestive heart failure) (HCC)   Rapid atrial fibrillation (HCC)   CKD (chronic kidney disease), stage IV ([place])  Patient is actively dying.  She is on morphine drip, currently comfortable.  We will continue to follow.    Consultants:   Palliative care  Procedures: (None Antimicrobials: None  Subjective: Patient is very sleepy, follow commands.  Does not have significant short of breath.  No nausea vomiting.  No pain.  Objective: Vitals:   09/04/19 0728 09/04/19 1112 09/04/19 1645 09/05/19 0619  BP: (!) 158/59 139/65 (!) 168/80 (!) 184/117  Pulse: 66 81 97 (!) 108  Resp: 20 16 17  (!) 22  Temp: 98.1 F (36.7 C) 97.8 F (36.6 C) 97.6 F (36.4 C) 98 F (36.7 C)  TempSrc: Oral Oral Oral  Oral  SpO2: 96% 97% 91% (!) 86%  Weight:      Height:        Intake/Output Summary (Last 24 hours) at 09/05/2019 0841 Last data filed at 09/05/2019 0630 Gross per 24 hour  Intake 720 ml  Output 950 ml  Net -230 ml   Filed Weights   08/14/2019 1939 09/03/19 0512 09/04/19 0358  Weight: 55.7 kg 60.1 kg 60.5 kg    Examination:  General exam: Appears calm and comfortable  Respiratory system: Decreased breathing sounds with shallow breathing.   Cardiovascular system: Regular, mildly tachycardic,. No JVD, murmurs, rubs, gallops or clicks. No pedal edema. Gastrointestinal system: Abdomen is nondistended, soft and nontender. No organomegaly or masses felt. Normal bowel sounds heard. Central nervous system: Drowsy with some confusion. No focal neurological deficits. Extremities: Symmetric  Skin: No rashes, lesions or ulcers      Data Reviewed: I have personally reviewed following labs and imaging studies  CBC: Recent Labs  Lab 09/03/2019 0631 09/02/19 0447 09/04/19 0447  WBC 21.6* 15.8* 10.3  NEUTROABS 18.2*  --  9.8*  HGB 13.1 11.6* 11.1*  HCT 38.5 34.4* 33.1*  MCV 90.6 91.7 91.2  PLT 245 211 578   Basic Metabolic Panel: Recent Labs  Lab 08/14/2019 1026 09/02/19 0447 09/03/19 0602 09/04/19 0447  NA 138 138 133* 135  K 4.1 5.2* 4.5 4.7  CL 101 100 99 98  CO2 19* 22 24 24   GLUCOSE 189* 131* 227* 244*  BUN 26* 43* 63* 72*  CREATININE  1.50* 2.30* 2.44* 2.35*  CALCIUM 9.6 9.2 8.6* 8.8*  MG  --   --   --  2.5*   GFR: Estimated Creatinine Clearance: 16.6 mL/min (A) (by C-G formula based on SCr of 2.35 mg/dL (H)). Liver Function Tests: No results for input(s): AST, ALT, ALKPHOS, BILITOT, PROT, ALBUMIN in the last 168 hours. No results for input(s): LIPASE, AMYLASE in the last 168 hours. No results for input(s): AMMONIA in the last 168 hours. Coagulation Profile: Recent Labs  Lab 09/12/2019 2039  INR 1.1   Cardiac Enzymes: No results for input(s): CKTOTAL, CKMB,  CKMBINDEX, TROPONINI in the last 168 hours. BNP (last 3 results) No results for input(s): PROBNP in the last 8760 hours. HbA1C: Recent Labs    09/03/19 0602  HGBA1C 6.0*   CBG: Recent Labs  Lab 09/03/19 1656  GLUCAP 237*   Lipid Profile: No results for input(s): CHOL, HDL, LDLCALC, TRIG, CHOLHDL, LDLDIRECT in the last 72 hours. Thyroid Function Tests: No results for input(s): TSH, T4TOTAL, FREET4, T3FREE, THYROIDAB in the last 72 hours. Anemia Panel: No results for input(s): VITAMINB12, FOLATE, FERRITIN, TIBC, IRON, RETICCTPCT in the last 72 hours. Sepsis Labs: No results for input(s): PROCALCITON, LATICACIDVEN in the last 168 hours.  Recent Results (from the past 240 hour(s))  SARS CORONAVIRUS 2 (TAT 6-24 HRS) Nasopharyngeal Nasopharyngeal Swab     Status: None   Collection Time: 08/28/19  4:13 PM   Specimen: Nasopharyngeal Swab  Result Value Ref Range Status   SARS Coronavirus 2 NEGATIVE NEGATIVE Final    Comment: (NOTE) SARS-CoV-2 target nucleic acids are NOT DETECTED. The SARS-CoV-2 RNA is generally detectable in upper and lower respiratory specimens during the acute phase of infection. Negative results do not preclude SARS-CoV-2 infection, do not rule out co-infections with other pathogens, and should not be used as the sole basis for treatment or other patient management decisions. Negative results must be combined with clinical observations, patient history, and epidemiological information. The expected result is Negative. Fact Sheet for Patients: SugarRoll.be Fact Sheet for Healthcare Providers: https://www.woods-mathews.com/ This test is not yet approved or cleared by the [place] FDA and  has been authorized for detection and/or diagnosis of SARS-CoV-2 by FDA under an Emergency Use Authorization (EUA). This EUA will remain  in effect (meaning this test can be used) for the duration of the COVID-19 declaration under  Section 56 4(b)(1) of the Act, 21 U.S.C. section 360bbb-3(b)(1), unless the authorization is terminated or revoked sooner. Performed at [facility], [place] [address], [place], [place] [address]   Respiratory Panel by RT PCR (Flu A&B, Covid) - Nasopharyngeal Swab     Status: None   Collection Time: 08/21/2019 11:29 AM   Specimen: Nasopharyngeal Swab  Result Value Ref Range Status   SARS Coronavirus 2 by RT PCR NEGATIVE NEGATIVE Final    Comment: (NOTE) SARS-CoV-2 target nucleic acids are NOT DETECTED. The SARS-CoV-2 RNA is generally detectable in upper respiratoy specimens during the acute phase of infection. The lowest concentration of SARS-CoV-2 viral copies this assay can detect is 131 copies/mL. A negative result does not preclude SARS-Cov-2 infection and should not be used as the sole basis for treatment or other patient management decisions. A negative result may occur with  improper specimen collection/handling, submission of specimen other than nasopharyngeal swab, presence of viral mutation(s) within the areas targeted by this assay, and inadequate number of viral copies (<131 copies/mL). A negative result must be combined with clinical observations, patient history, and  epidemiological information. The expected result is Negative. Fact Sheet for Patients:  PinkCheek.be Fact Sheet for Healthcare Providers:  GravelBags.it This test is not yet ap proved or cleared by the [place] FDA and  has been authorized for detection and/or diagnosis of SARS-CoV-2 by FDA under an Emergency Use Authorization (EUA). This EUA will remain  in effect (meaning this test can be used) for the duration of the COVID-19 declaration under Section 564(b)(1) of the Act, 21 U.S.C. section 360bbb-3(b)(1), unless the authorization is terminated or revoked sooner.    Influenza A by PCR NEGATIVE NEGATIVE Final   Influenza B by PCR NEGATIVE  NEGATIVE Final    Comment: (NOTE) The Xpert Xpress SARS-CoV-2/FLU/RSV assay is intended as an aid in  the diagnosis of influenza from Nasopharyngeal swab specimens and  should not be used as a sole basis for treatment. Nasal washings and  aspirates are unacceptable for Xpert Xpress SARS-CoV-2/FLU/RSV  testing. Fact Sheet for Patients: PinkCheek.be Fact Sheet for Healthcare Providers: GravelBags.it This test is not yet approved or cleared by the [place] FDA and  has been authorized for detection and/or diagnosis of SARS-CoV-2 by  FDA under an Emergency Use Authorization (EUA). This EUA will remain  in effect (meaning this test can be used) for the duration of the  Covid-19 declaration under Section 564(b)(1) of the Act, 21  U.S.C. section 360bbb-3(b)(1), unless the authorization is  terminated or revoked. Performed at [facility], [address] [place], [place] [address]          Radiology Studies: No results found.      Scheduled Meds: . sodium chloride flush  3 mL Intravenous Q12H   Continuous Infusions: . morphine 2 mg/hr (09/04/19 1327)     LOS: 4 days    Time spent: 25 min    Sharen Hones, MD [facility]   To contact the attending provider between 7A-7P or the covering provider during after hours 7P-7A, please log into the web site [email or web address] and access using universal [place] password for that web site. If you do not have the password, please call the hospital operator.  09/05/2019, 8:41 AM

## 2019-09-05 NOTE — Progress Notes (Signed)
Daily Progress Note   Patient Name: Tina [name]       Date: 09/05/2019 DOB: 1935/02/02  Age: 85 y.o. MRN#: [id number] Attending Physician: Sharen Hones, MD Primary Care Physician: Einar Pheasant, MD Admit Date: 09/09/2019  Reason for Consultation/Follow-up: Terminal Care  Subjective: Patient sleeping. Family members at bedside.   Length of Stay: 4  Current Medications: Scheduled Meds:  . sodium chloride flush  3 mL Intravenous Q12H    Continuous Infusions: . morphine 2 mg/hr (09/04/19 1327)    PRN Meds: acetaminophen **OR** acetaminophen, antiseptic oral rinse, glycopyrrolate **OR** glycopyrrolate **OR** glycopyrrolate, haloperidol **OR** haloperidol **OR** haloperidol lactate, ipratropium-albuterol, LORazepam **OR** LORazepam **OR** LORazepam, morphine, ondansetron **OR** ondansetron (ZOFRAN) IV, polyvinyl alcohol, sodium chloride flush  Physical Exam Constitutional:      General: She is not in acute distress.    Comments: Opens eyes to physical stimulation, no verbal interaction, appears comfortable  Pulmonary:     Effort: Pulmonary effort is normal. No respiratory distress.     Comments: No work of breathing boted Skin:    General: Skin is warm and dry.             Vital Signs: BP (!) 184/117 (BP Location: Right Arm)   Pulse (!) 108   Temp 98 F (36.7 C) (Oral)   Resp (!) 22   Ht '5\' 5"'  (1.651 m)   Wt 60.5 kg   SpO2 (!) 86%   BMI 22.18 kg/m  SpO2: SpO2: (!) 86 % O2 Device: O2 Device: Room Air O2 Flow Rate: O2 Flow Rate (L/min): 5 L/min  Intake/output summary:   Intake/Output Summary (Last 24 hours) at 09/05/2019 1105 Last data filed at 09/05/2019 0630 Gross per 24 hour  Intake 360 ml  Output 950 ml  Net -590 ml   LBM: Last BM Date: 09/02/19 Baseline Weight: Weight:  60 kg Most recent weight: Weight: 60.5 kg       Palliative Assessment/Data: PPS 10%      Patient Active Problem List   Diagnosis Date Noted  . Atrial fibrillation with RVR ([place]) 08/22/2019  . Rapid atrial fibrillation ([place]) 09/02/2019  . CKD (chronic kidney disease), stage IV ([place]) 08/19/2019  . Acute renal failure (ARF) ([place]) 08/28/2019  . Palliative care by specialist   . DNR (do not resuscitate) discussion   . Pleural effusion, left 08/17/2019  . Peripheral neuropathy 08/17/2019  . History of pulmonary embolism 08/17/2019  . Dyspnea 08/13/2019  . COPD with acute exacerbation ([place]) 08/02/2019  . Acute on chronic diastolic CHF (congestive heart failure) ([place]) 08/02/2019  . Abnormality of gait 08/02/2019  . Renal artery stenosis ([place]) 07/01/2019  . DOE (dyspnea on exertion) 05/25/2019  . Diarrhea 05/10/2019  . AKI (acute kidney injury) ([place])   . Hypokalemia   . Foot drop 04/02/2019  . Low back pain 03/16/2019  . Leg pain 03/16/2019  . Aortic atherosclerosis ([place]) 01/07/2019  . Hoarseness 06/23/2018  . Unsteadiness 05/11/2018  . CHF (congestive heart failure) ([place]) 10/20/2017  . Pulmonary embolus ([place]) 06/28/2017  . 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 Goals of care, counseling/discussion 03/02/2017  . Abnormal computed tomography of gastrointestinal tract   . Noninfectious diarrhea   .  Ulceration of intestine   . Hernia, hiatal   . Bladder mass   . Mesenteric ischemia due to arterial insufficiency ([place])   . Polycythemia vera ([place]) 10/21/2016  . Elevated troponin 06/14/2016  . CKD (chronic kidney disease), stage III ([place]) 06/14/2016  . Leukocytosis 06/14/2016  . Abdominal bruit 04/16/2016  . Abnormal mammogram 06/06/2015  . Essential hypertension 02/16/2015  . Numbness 01/24/2015  . Personal history of tobacco use, presenting hazards to health 11/25/2014  . BRCA positive   . COPD exacerbation ([place]) 11/14/2014  . Sensation of pressure in bladder  area 08/15/2014  . Health care maintenance 08/15/2014  . Presence of coronary angioplasty implant and graft 09/30/2013  . History of colonic polyps 08/25/2012  . Female genuine stress incontinence 06/28/2012  . Infection of urinary tract 06/28/2012  . CAD (coronary artery disease) 05/23/2012  . COPD, severe ([place]) 05/23/2012  . Hypercholesterolemia 05/23/2012  . Hyperglycemia 05/23/2012  . Polycythemia, secondary 05/23/2012  . Renal cyst 05/23/2012  . Osteoporosis 05/23/2012    Palliative Care Assessment & Plan   HPI: JoyceViersis a84 y.o.femalewith a known history of CAD, diastolic CHF, COPD, hypertension, CKDis being admitted for acute on chronic kidney disease stage IIIb.She reports shortness of breathmainly on exertion.   Assessment: Patient was made full comfort care 4/22.  On my assessment today she appears comfortable with morphine drip at 2 mg/hr. No boluses required per chart review. Discussed with family - they feel she is comfortable as well. Discussed option of transfer to hospice facility although I am unsure if she is stable enough for transport - family would like her to stay here and they tell me patient had requested to stay here when she was awake.   Recommendations/Plan:  Continue comfort measures  Family and patient would like to stay here and avoid transfer to hospice home - likely too unstable for transfer  Adjusted orders for comfort care - remove cardiac monitor  Goals of Care and Additional Recommendations:  Limitations on Scope of Treatment: Full Comfort Care  Code Status:  DNR  Prognosis:   Hours - Days  Discharge Planning:  Anticipated Hospital Death  Care plan was discussed with RN and family at bedside  Thank you for allowing the Palliative Medicine Team to assist in the care of this patient.   Total Time 15 minutes Prolonged Time Billed  no       Greater than 50%  of this time was spent counseling and coordinating care  related to the above assessment and plan.  Juel Burrow, DNP, [facility] Palliative Medicine Team Team Phone # [phone]  Pager [phone]

## 2019-09-05 NOTE — Plan of Care (Signed)
  Problem: Education: Goal: Knowledge of the prescribed therapeutic regimen will improve Outcome: Progressing Family members at bedside.  Aware of plan of care with comfort care orders.   Problem: Clinical Measurements: Goal: Quality of life will improve Outcome: Progressing Oxygen saturations decreased to 85-89% on room air since 4am.  Pt remains awake and talkative.  Denies SOB.  No distress noted.   Problem: Pain Management: Goal: Satisfaction with pain management regimen will improve Outcome: Progressing Patient denies pain.  No distress noted.

## 2019-09-13 NOTE — Progress Notes (Signed)
Patient time of death 43 and pronounced by Lowella Petties.Family at bedside at time of death. Sharion Settler NP notified and she was on the floor within the hour to fill out paperwork.

## 2019-09-13 NOTE — Discharge Summary (Signed)
Death Summary  CELLIE DARDIS PYP:[id number] DOB: 1934-05-20 DOA: 09/24/19  PCP: Einar Pheasant, MD PCP/Office notified: Yes  Admit date: 2019/09/24 Date of Death: 09-29-2019  Final Diagnoses:  Principal Problem:   Atrial fibrillation with RVR ([place]) Active Problems:   COPD, severe ([place])   Essential hypertension   Leukocytosis   CHF (congestive heart failure) (H[facility])   Rapid atrial fibrillation ([facility])   CKD (chronic kidney disease), stage IV ([facility])   Comfort measures only status   DNR (do not resuscitate)  COPD exacerbation Acute on chronic hypoxemic restaurant failure Acute on chronic diastolic congestive heart failure.    History of present illness:  Patient is a 85 year old female with history of COPD with chronic hypoxemic restaurant failure, on 2 L oxygen, chronic diastolic congestive heart failure, chronic kidney disease stage IIIb, coronary disease, hypertension, who was admitted to the hospital with atrial fibrillation and a rapid ventricular response, acute on chronic hypoxemic restaurant failure. She has been giving IV Lasix, she was also started on IV steroids for COPD exacerbation. Chest x-ray showed changes consistent with CHF exacerbation.  Hospital Course:  After 2 days treatment, patient condition did not improve.  Patient was evaluated by palliative care, opted to comfort care only.  She was placed morphine drip and kept comfortable.  Patient died naturally at 77 hrs.   Time: 29-Sep-2022 03:15  Signed:  Sharen Hones  [facility] 09-29-19, 10:30 AM

## 2019-09-13 DEATH — deceased

## 2019-10-01 ENCOUNTER — Encounter (INDEPENDENT_AMBULATORY_CARE_PROVIDER_SITE_OTHER): Payer: Medicare Other

## 2019-10-01 ENCOUNTER — Ambulatory Visit (INDEPENDENT_AMBULATORY_CARE_PROVIDER_SITE_OTHER): Payer: Medicare Other | Admitting: Nurse Practitioner

## 2019-10-20 ENCOUNTER — Ambulatory Visit: Payer: Medicare Other

## 2019-11-12 ENCOUNTER — Other Ambulatory Visit: Payer: Medicare Other

## 2019-11-12 ENCOUNTER — Ambulatory Visit: Payer: Medicare Other

## 2019-11-13 ENCOUNTER — Other Ambulatory Visit: Payer: Medicare Other

## 2019-11-13 ENCOUNTER — Ambulatory Visit: Payer: Medicare Other | Admitting: Oncology

## 2024-04-01 NOTE — Telephone Encounter (Signed)
 Open in error
